# Patient Record
Sex: Male | Born: 1935 | Hispanic: Yes | Smoking: Never smoker
Health system: Southern US, Community
[De-identification: ages and names within clinical notes are randomized; demographics above are authoritative.]

## PROBLEM LIST (undated history)

## (undated) DIAGNOSIS — I4891 Unspecified atrial fibrillation: Secondary | ICD-10-CM

## (undated) DIAGNOSIS — I509 Heart failure, unspecified: Secondary | ICD-10-CM

## (undated) DIAGNOSIS — R0602 Shortness of breath: Secondary | ICD-10-CM

## (undated) DIAGNOSIS — I2510 Atherosclerotic heart disease of native coronary artery without angina pectoris: Secondary | ICD-10-CM

## (undated) DIAGNOSIS — I10 Essential (primary) hypertension: Secondary | ICD-10-CM

## (undated) DIAGNOSIS — I452 Bifascicular block: Secondary | ICD-10-CM

## (undated) DIAGNOSIS — R569 Unspecified convulsions: Secondary | ICD-10-CM

## (undated) DIAGNOSIS — I447 Left bundle-branch block, unspecified: Secondary | ICD-10-CM

## (undated) DIAGNOSIS — E7800 Pure hypercholesterolemia, unspecified: Secondary | ICD-10-CM

## (undated) DIAGNOSIS — I272 Pulmonary hypertension, unspecified: Secondary | ICD-10-CM

## (undated) DIAGNOSIS — I219 Acute myocardial infarction, unspecified: Secondary | ICD-10-CM

## (undated) DIAGNOSIS — I513 Intracardiac thrombosis, not elsewhere classified: Secondary | ICD-10-CM

## (undated) DIAGNOSIS — I639 Cerebral infarction, unspecified: Secondary | ICD-10-CM

## (undated) HISTORY — DX: Shortness of breath: R06.02

## (undated) HISTORY — PX: INGUINAL HERNIA REPAIR: SUR1180

## (undated) HISTORY — DX: Bifascicular block: I45.2

## (undated) HISTORY — DX: Left bundle-branch block, unspecified: I44.7

## (undated) HISTORY — DX: Heart failure, unspecified: I50.9

---
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
## 2002-02-04 ENCOUNTER — Encounter: Payer: Self-pay | Admitting: General Surgery

## 2002-02-05 ENCOUNTER — Encounter (INDEPENDENT_AMBULATORY_CARE_PROVIDER_SITE_OTHER): Payer: Self-pay

## 2002-02-05 ENCOUNTER — Inpatient Hospital Stay (HOSPITAL_COMMUNITY): Admission: RE | Admit: 2002-02-05 | Discharge: 2002-02-08 | Payer: Self-pay | Admitting: General Surgery

## 2002-05-27 ENCOUNTER — Inpatient Hospital Stay (HOSPITAL_COMMUNITY): Admission: EM | Admit: 2002-05-27 | Discharge: 2002-05-30 | Payer: Self-pay | Admitting: Emergency Medicine

## 2004-03-27 ENCOUNTER — Inpatient Hospital Stay (HOSPITAL_COMMUNITY): Admission: EM | Admit: 2004-03-27 | Discharge: 2004-03-29 | Payer: Self-pay | Admitting: Emergency Medicine

## 2004-03-29 ENCOUNTER — Encounter (INDEPENDENT_AMBULATORY_CARE_PROVIDER_SITE_OTHER): Payer: Self-pay | Admitting: *Deleted

## 2004-05-05 ENCOUNTER — Ambulatory Visit (HOSPITAL_COMMUNITY): Admission: RE | Admit: 2004-05-05 | Discharge: 2004-05-05 | Payer: Self-pay | Admitting: Cardiology

## 2005-03-02 ENCOUNTER — Emergency Department (HOSPITAL_COMMUNITY): Admission: EM | Admit: 2005-03-02 | Discharge: 2005-03-02 | Payer: Self-pay | Admitting: Emergency Medicine

## 2007-10-31 ENCOUNTER — Emergency Department (HOSPITAL_COMMUNITY): Admission: EM | Admit: 2007-10-31 | Discharge: 2007-10-31 | Payer: Self-pay | Admitting: Emergency Medicine

## 2008-02-15 ENCOUNTER — Emergency Department (HOSPITAL_COMMUNITY): Admission: EM | Admit: 2008-02-15 | Discharge: 2008-02-15 | Payer: Self-pay | Admitting: Emergency Medicine

## 2008-02-26 ENCOUNTER — Emergency Department (HOSPITAL_COMMUNITY): Admission: EM | Admit: 2008-02-26 | Discharge: 2008-02-26 | Payer: Self-pay | Admitting: Emergency Medicine

## 2010-07-10 ENCOUNTER — Encounter: Payer: Self-pay | Admitting: Cardiology

## 2010-11-05 NOTE — Cardiovascular Report (Signed)
NAMEDAMEON, SOLTIS NO.:  [id number]   MEDICAL RECORD NO.:  [id number]          PATIENT TYPE:  OIB   LOCATION:  2899                         FACILITY:  [facility]   PHYSICIAN:  Madaline Savage, M.D.DATE OF BIRTH:  September 11, 1932   DATE OF PROCEDURE:  05/05/2004  DATE OF DISCHARGE:                              CARDIAC CATHETERIZATION   PROCEDURES PERFORMED:  1.  Selective coronary angiography by Judkins technique.  2.  Retrograde left heart catheterization.  3.  Left ventricular angiography.  4.  Right heart catheterization with thermodilution and Fick cardiac output      determinations.   ENTRY SITE:  Right femoral.   DYE USED:  Omnipaque.   MEDICATIONS GIVEN:  None.   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  He is taken care of by the doctors at [facility] here in  [place].  I have been his cardiologist for some time.   The patient has had chronic congestive heart failure symptoms and has a  known nonischemic cardiomyopathy with ejection fractions anywhere between  15% and 25%.  He recently complained of some chest discomfort and had a  Cardiolite stress test performed on April 20, 2004; the left ventricular  ejection fraction was measured at 27% with global hypokinesis noted and  there was said to be mild anterolateral wall ischemia.  The patient was  brought in today with a Spanish-speaking interpreter for diagnostic cardiac  catheterization, which was performed uneventfully.   RESULTS:   PRESSURES:  Left ventricular pressure was 121/6, end-diastolic pressure 31,  central aortic pressure 121/50 and mean of 80.  Right atrial mean pressure  9, rapid ventricular pressure 40/15, end-diastolic pressure 25.  Pulmonary  capillary wedge mean pressure was 18.  Thermodilution cardiac output was  4.3, thermodilution cardiac index was 4.3.  Fick  cardiac output was 4.0,  Fick cardiac index was 2.2.  There was no aortic valve gradient by pullback  technique.   ANGIOGRAPHIC RESULTS:  The patient's coronary arteries were widely patent  throughout.  There was mild calcification in the proximal LAD and distal  left main, but no significant lesions noted at either place.  The LAD gave  rise to 1 major diagonal branch, an intermediate ramus branch and a  nondominant circumflex branch, all of which were normal except for some mild  calcifications.   The right coronary artery was a large dominant vessel giving rise to a  posterior descending branch and 2 posterolateral branches.  No lesions were  seen.  The left ventricular angiogram showed that there was global  hypokinesis with an ejection fraction estimate of 15%.  There was mild  mitral regurgitation.  There was no evidence of any LV thrombus.  Peripheral  arteriography was not performed.   FINAL DIAGNOSES:  1.  Nonspecific cardiomyopathy.      1.  Angiographic patent coronary arteries.      2.  Left ventricular ejection fraction qualitatively 15%.      3.  Mild mitral regurgitation.      4.  Mild-to-moderate pulmonary hypertension with pulmonary artery          pressures stable from catheterization in 2003.  2.  Chronic congestive heart failure in a 77 year old who continues to work.  3.  Left bundle branch block.   RECOMMENDATIONS:  Considering the patient's symptomatology, pulmonary  hypertension, left bundle branch block and left ventricular ejection  fraction, the patient should continue on ACE inhibitor, beta blocker,  digoxin, Lasix, aldosterone antagonist and should be considered for  placement of a biventricular device for his congestive heart failure.  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       WHG/MEDQ  D:  05/05/2004  T:  05/05/2004  Job:  [id number]   cc:   Rosanna Randy E. Severiano Gilbert, M.D.  Fax: [phone]   [facility]

## 2010-11-05 NOTE — Discharge Summary (Signed)
NAMELATRON, RIBAS NO.:  [id number]   MEDICAL RECORD NO.:  [id number]                   PATIENT TYPE:  INP   LOCATION:  2001                                 FACILITY:  [facility]   PHYSICIAN:  Darlin Priestly, M.D.             DATE OF BIRTH:  [birth year]   DATE OF ADMISSION:  05/27/2002  DATE OF DISCHARGE:  05/30/2002                                 DISCHARGE SUMMARY   DISCHARGE DIAGNOSES:  1. Chest pain.     a. Negative myocardial infarction.     b. Patent coronary arteries.  2. Abnormal electrocardiogram.  3. Nonischemic cardiomyopathy, ejection fraction 25%.  4. Hyperlipidemia.  5. Hypertension, now controlled.  6. Premature ventricular contractions.  7. Moderate pulmonary hypertension.   PROCEDURES:  May 28, 2002:  Left heart catheterization followed by right  heart catheterization by Dr. Madaline Savage.   DISCHARGE CONDITION:  Improved.   DISCHARGE MEDICATIONS:  1. Enteric-coated aspirin 325 mg daily.  2. Lipitor 20 daily.  3. Stool softener as needed.  4. Aldactone 12.5 mg 1 daily.  5. Lanoxin 0.125 mg daily.  6. Coreg 6.25 one twice a day.  7. Vasotec 10 mg twice a day.  8. Zantac as before.   ACTIVITY:  No lifting over 15-20 pounds.  No strenuous activity.  No work  until Wednesday, December 17.   DIET:  A very low-salt diet.  No added salt.   WOUND CARE:  Wash catheterization site with soap and water.  Call the office  if any bleeding, swelling, or drainage.   DISCHARGE INSTRUCTIONS:  Have blood work done at [facility] on Monday.   FOLLOW UP:  See Dr. Jenne Campus on June 04, 2002 at 10:30, phone number [phone]  [phone].   The interpreter was with [name], as the patient does not speak any [place].  All  of his instructions while written in English were reviewed in [place].  The  patient had a good understanding prior to discharge of his instructions.   HISTORY OF PRESENT ILLNESS:  A 77 year old [name] with no [place]-  speaking skills presented to the emergency room May 27, 2002 with  substernal chest pain, dyspnea on exertion for about two weeks.  The patient  states he had always had a failing heart but no heart catheterization  previously.  Five years ago, he was diagnosed with heart failure.  His chest  pain only came with exertion and relieved with rest.   PAST MEDICAL HISTORY:  Hypertension, hyperlipidemia with an LDL of 152 in  July 2003, reflux disease, and status post inguinal hernia repair.   OUTPATIENT MEDICATIONS:  1. Lasix 40.  2. Enalapril 10 daily.  3. Zantac.   ALLERGIES:  SULFA.   SOCIAL HISTORY:  No smoking, no alcohol.  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  He has to push a mop.  He  has not been able to lift  over 20 pounds with his job secondary to his heart.  He basically just  pushes a mop.  He is not married.  He lives with a girlfriend.  He has two  children and two grandchildren.  He moved here two years ago.   FAMILY HISTORY:  Mother died of an MI in her 79s.  Father died of an MI at  40.   REVIEW OF SYSTEMS:  See H&P.   PHYSICAL EXAMINATION:  VITAL SIGNS:  Physical exam at discharge:  Blood  pressure was 118/70, on the night shift unsure if it is 114/80 or 154/80.  Pulse was 75, respirations 18, and temperature was 98.  Room air oxygen  saturation was 97%.  GENERAL:  Alert, oriented male in no acute distress.  SKIN:  Warm and dry.  LUNGS:  Clear to auscultation.  HEART:  S1, S2.  Regular rate and rhythm without gallop.  ABDOMEN:  Soft, positive bowel sounds.   LABORATORY DATA:  Hemoglobin 15.3, hematocrit 45.2, WBC 12.2, MCV 90,  platelets 276, neutrophils 81, lymphs 12, monos 5, eos 1, baso 1.  These  remained stable throughout hospitalization.  Coags:  PT 14.8, INR 1.2, PTT  31.  Chemistries:  Sodium 141, potassium 4.6, chloride 108, CO2 26, glucose  93, BUN 24, creatinine 1.4, calcium 8.4.  These remained stable.  CK 54, MB  1.9,  troponin 0.01.  Other CK's were all negative.  TSH 1.20.  Urinalysis  had a trace leukocyte esterase.  Culture was still pending.   Chest x-ray:  Cardiomegaly, question right middle lobe atelectasis or  infiltrate.  EKG on admission:  Sinus rhythm, rate of 96, with PVC's and  left ventricular hypertrophy.  Followup ST-T wave abnormality considering  lateral ischemia and no significant change overnight.  Cardiac  catheterization:  Please see Dr. Truett Perna dictated note for complete  details.  On May 31, 2002, normal coronary arteries, LV, EF was 25%.  Pulmonary capillary wedge pressure was 32.  Cardiac output 4.4 and index 2.3  by thermal.  __________ was 2.5 and 1.3.  No mitral gradient.   HOSPITAL COURSE:  The patient was admitted by Dr. Jenne Campus May 27, 2002  after presenting with chest pain, shortness of breath.  He was placed on IV  heparin and nitroglycerin and admitted to the telemetry unit.  Plans were  made for cardiac catheterization, which he underwent on December 9.  Cardiac  enzymes were negative for MI.  He underwent cardiac catheterization with  results as above.   By May 29, 2002, secondary to a cardiomyopathy, non-ischemic, he was  started on Lanoxin.  His Enalapril was increased and his Coreg was added and  Lopressor discontinued.  Blood pressure medications were held on December 10  secondary to hypertension.  That had improved by December 11 and he was  given his whole dose of medications and by December 30 he had no dizziness,  no lightheadedness, and blood pressure was stable at 100.  He was discharged  home after a discussion with the interpreter.  He will be followed up as an  outpatient.     Darcella Gasman. Brooke Dare, M.D.    LRI/MEDQ  D:  05/30/2002  T:  05/30/2002  Job:  [id number]   cc:   Maurice March, M.D.  [address]  [address]  [place]  [place] [address]  Fax: [phone]

## 2010-11-05 NOTE — H&P (Signed)
NAME:  Tyler Christensen, BRING NO.:  [id number]   MEDICAL RECORD NO.:  [id number]          PATIENT TYPE:  EMS   LOCATION:  MAJO                         FACILITY:  [facility]   PHYSICIAN:  Lonia Blood, M.D.      DATE OF BIRTH:  17-Jul-1932   DATE OF ADMISSION:  03/26/2004  DATE OF DISCHARGE:                                HISTORY & PHYSICAL   PRIMARY CARE PHYSICIAN:  Unassigned. Goes to [employer].   PRESENTING COMPLAINT:  Chest pain, shortness of breath times three days.   HISTORY OF PRESENT ILLNESS:  This is a 77 year old [name] immigrant who is  non-English-speaking presenting with a three day history of exertional  dyspnea and chest pain which is mainly right sided associated with some  numbness.  The patient has been noncompliant with his medication and has had  a history of CHF for the past two days.  He last saw his primary care  physician one and a half years ago.  He recently saw another private  physician, Dr. __________ , two months ago for medication refills.  The  patient described his shortness of breath as exertional and has been  progressive over the three days.  He has also had this chest pain that is  nagging but has resolved here with some nitro in the hospital.  Otherwise he  has been doing okay with his ADLs.   PAST MEDICAL HISTORY:  1.  Nonischemic cardiomyopathy with an EF of 25% in 2003.  2.  The patient had a cath done by Dr. Elsie Lincoln that shows patent coronary      arteries at the time.  3.  History of dyslipidemia.  4.  CHF.  5.  Hypertension.  6.  Frequent PVCs.  7.  Pulmonary hypertension.   ALLERGIES:  SULFA.   MEDICATIONS:  1.  Digoxin 0.125 mg daily.  2.  Lasix 40 mg daily.  3.  Enalapril 10 mg daily.  4.  Coreg 6.25 mg daily.  5.  Spironolactone 25 mg daily.  6.  Zocor 40 mg daily.   SOCIAL HISTORY:  The patient speaks no English at all and speak through an  interpreter.  He lives here in [place] with his wife.  He immigrated  from  [place] four years ago.  He works with a company.  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  Still  able to go to work.  Denied any tobacco or alcohol intake.  The patient has  two children and two grandchildren.   FAMILY HISTORY:  His parents died back in [place].  Mother died in her 63s of  MI and his father also died of MI at the age of 82.  His children are both  healthy.   REVIEW OF SYSTEMS:  GENERAL:  Mainly tiredness and weak.  There is no weight  gain or weight loss.  RESPIRATORY:  As in HPI.  CARDIOVASCULAR:  As in HPI.  ABDOMEN:  The patient denied any nausea, vomiting, or diarrhea, or abdominal  pain.  GU:  The patient denied any dysuria or discharge.  EXTREMITIES:  The  patient denied any leg weakness or joint swelling.   PHYSICAL EXAMINATION:  VITAL SIGNS:  Temperature is 97.3, his blood pressure  is 121/75, pulse of 98, respiratory rate of 20, his sat is 99% on 2 liters.  GENERAL:  The patient is non-English-speaking but very __________  speaks  fluently through an interpreter.  HEENT:  PERRL.  EOMI.  NECK:  Supple.  Mildly elevated JVD about 5-cm.  CHEST:  Showed increased crackles at the bases but no wheezes or rales.  CARDIOVASCULAR:  The patient is tachycardic by exam with a grade 3/4  systolic ejection murmur.  ABDOMEN:  Distended, firm, nontender, with positive bowel sounds.  EXTREMITIES:  Show no evidence of edema, cyanosis, or clubbing.  SKIN:  Warm and dry.   LABS:  Showed a BNP of 1,417.  Creatinine is 1.4, sodium 138, potassium 4.5,  chloride 106, glucose is 104, BUN 22.   His EKG showed a new left bundle branch block that was not there two years  ago.   Chest x-ray shows cardiomegaly.   ASSESSMENT:  This is a 77 year old gentleman with a history of congestive  heart failure and severely decreased ejection fraction of 25% who was last  seen by a cardiologist two years ago.  He had followup scheduled  as an  outpatient but he never did that and apparently has not been very compliant,  although he brought in his medications.  The patient is here today with  exertional dyspnea and chest pain.  Most likely with a high BNP and his  presentation it seems like the patient is having a congestive heart failure  exacerbation.  The cause of the exacerbation is not clear.  The patient said  that he had severe cold symptoms two weeks ago but all that has cleared.  His current symptoms have been steadily increasing for three days now.  With  a family history of myocardial infarction, although at old age and the fact  that he has a new left bundle branch block, it is worrisome for probably new  myocardial infarction in the patient.  We will, therefore, admit the  patient, try and rule out myocardial infarction by checking his enzymes,  treat his congestive heart failure by increasing his diuresis, and keep him  on his beta-blockers, ACE inhibitor, and digoxin.  We will check a digoxin  level in the patient.  Meanwhile, we will start the patient on some  nitroglycerin as needed.  His blood pressure is borderline, I will,  therefore, avoid using any drip or paste.  I will use oral nitroglycerin  sublingual as needed.  I will also start him on some Lovenox  prophylactically.  If his enzymes show any sign of bump, I will switch him  to a therapeutic dose.  Meanwhile, I will give him some intravenous morphine  as needed for pain while we are working him out.  I will definitely call in  cardiology to see the patient tomorrow as well as check a 2D echocardiogram.  Other things I will do is check his TSH, check his urine drug screen and  fasting lipid panel for his hyperlipidemia.       LG/MEDQ  D:  03/27/2004  T:  03/27/2004  Job:  [id number]

## 2010-11-05 NOTE — Cardiovascular Report (Signed)
NAMEJAIVYN, Tyler Christensen NO.:  [id number]   MEDICAL RECORD NO.:  [id number]                   PATIENT TYPE:  INP   LOCATION:  2001                                 FACILITY:  [facility]   PHYSICIAN:  Madaline Savage, M.D.             DATE OF BIRTH:  [birth year]   DATE OF PROCEDURE:  05/28/2002  DATE OF DISCHARGE:                              CARDIAC CATHETERIZATION   PROCEDURES PERFORMED:  1. Selective coronary angiography by Judkins technique.  2. Retrograde left heart catheterization.  3. Left ventricular angiography.  4. Right heart catheterization.  5. Thermodilution cardiac output determinations.   ENTRY SITE:  Right femoral.   DYE USED:  Omnipaque.   MEDICATIONS GIVEN:  Versed 2 mg intravenously.   PATIENT PROFILE:  The patient is a 77 year old [place] gentleman who entered  the emergency room complaining of chest pain and shortness of breath on  May 27, 2002.  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  His  mother diet at age 75 of a heart attack, father died at age 75 of a heart  attack.  The patient has a history of a hernia repair by Dr. Kendrick Ranch  several months ago.   RESULTS:  PRESSURES:  The right atrial mean pressure was 17.  Right  ventricular pressure was 60/5, end-diastolic pressure 12.  Pulmonary artery  pressure 50/33, mean of 40.  Pulmonary capillary wedge mean pressure was 32,  left ventricular pressure was 120/14.  End-diastolic pressure 23.  Central  aortic pressure 120/55, mean of 80.  Thermodilution cardiac outputs 4.4,  index 2.3.  Fick cardiac outputs 2.5, index 1.3.  Left ventricular ejection  fraction both qualitative and quantitative were 25%.   ANGIOGRAPHIC RESULTS:  The left main coronary artery was large and normal in  appearance.   The left anterior descending coronary artery gave rise to a very large  diagonal branch very near  its origin from the left main coronary artery.  This vessel was normal.  A second smaller bifurcating branch arose distal to  three septal perforator branches and was normal.  The LAD itself contains a  speck of calcification proximally but no significant lesions.   The left circumflex coronary artery gave rise to one obtuse marginal branch  and was normal throughout.  There was a separate atrial branch coming off of  the left main coronary artery separate from the circumflex which also  contained no lesions.   The right coronary artery was angiographically patent throughout and was a  dominant vessel giving rise to two acute marginal branches, a pulmonary  conus and a sinus node branch and terminating as a posterior descending and  a posterolateral branch, all of which appeared normal.   The left ventricle was dilated.  It was globally hypokinetic.  The best  motion is at the upper anterolateral wall.  There appears to be dyskinetic  motion at the apex.  There is no obvious LV thrombosis noted.  There is  trivial mitral regurgitation seen.  The ascending aorta appears normal.   FINAL DIAGNOSES:  1. Angiographic patent coronary arteries.  2. Congestive dilated cardiomyopathy, nonischemic type.  3. Moderate pulmonary hypertension.  4. Low cardiac index by Fick.   RECOMMENDATIONS:  The patient should be started on digitalis.  We will  continue his ACE inhibitor beta blocker and aspirin.  He will get a two-  dimensional echocardiogram either while here in the hospital as an  outpatient to confirm my suspicion of only mild mitral regurgitation.  The  patient will need followup and will need the assistance of a Spanish  interpreter in his future travels.                                                  Madaline Savage, M.D.    WHG/MEDQ  D:  05/28/2002  T:  05/28/2002  Job:  [id number]   cc:   Darlin Priestly, M.D.  [address], [address]  [place]  [place] [address]  Fax:  [phone]   Room #2001   Cardiac Catheterization Laboratory

## 2010-11-05 NOTE — Discharge Summary (Signed)
   Tyler Christensen, Tyler Christensen                             ACCOUNT NO.:  [id number]   MEDICAL RECORD NO.:  [id number]                   PATIENT TYPE:  INP   LOCATION:  0376                                 FACILITY:  [facility]   PHYSICIAN:  Timothy E. Earlene Plater, M.D.              DATE OF BIRTH:  10/20/1932   DATE OF ADMISSION:  12/06/2001  DATE OF DISCHARGE:  12/09/2001                                 DISCHARGE SUMMARY   FINAL DIAGNOSES:  Incarcerated direct left inguinal hernia, right direct and  indirect inguinal hernia.   OPERATIVE PROCEDURE:  February 05, 2002:  Repair of bilateral inguinal  hernias.   HISTORY OF PRESENT ILLNESS:  This is a [name] American who does not speak  [place].  He has an enormous left inguinal hernia incarcerated.  Surgical  procedure was done on August 19 after careful interpreted evaluation in the  office.  Because of the size of the hernia and the amount of bowel  incarcerated, I elected to keep the patient a couple of days in the hospital  to control pain, to watch the wound, and to avoid GI problems.   His admitting laboratory data was normal except for cardiogram which was  abnormal, nonspecific conduction delay, nonspecific T-wave changes.  Chest x-  ray was negative.   The patient had an uneventful postoperative recovery.  GI function resumed.  No wound problems.  He was discharged on August 22 with pain pills only.  He  will be followed closely as an outpatient.                                               Timothy E. Earlene Plater, M.D.    TED/MEDQ  D:  02/14/2002  T:  02/14/2002  Job:  [id number]

## 2010-11-05 NOTE — Discharge Summary (Signed)
NAME:  Tyler Christensen, Tyler Christensen NO.:  [id number]   MEDICAL RECORD NO.:  [id number]          PATIENT TYPE:  INP   LOCATION:  3709                         FACILITY:  [facility]   PHYSICIAN:  Mallory Shirk, MD     DATE OF BIRTH:  03-Jun-1933   DATE OF ADMISSION:  03/26/2004  DATE OF DISCHARGE:  03/29/2004                                 DISCHARGE SUMMARY   HISTORY OF PRESENT ILLNESS:  Mr. Cieslinski is a 77 year old [name] immigrant who  is non-English speaking, who presented with a three day history of  exertional dyspnea and chest pain, mainly on the right side, associated with  some numbness.  The patient has been noncompliant with his medications, and  has had a history of congestive heart failure in the past with an ejection  fraction of 25% in 2003.  A cardiac catheterization done by Dr. Elsie Lincoln in  2003 indicated patent coronary arteries.  The patient describes shortness of  breath as something he noticed after he was working out in the yard and  progressively worsened over the three days prior to admission.  The patient  also stated the chest pain has been on the right side, has been nagging, but  he came into the hospital and then the pain dissolved upon administration of  nitroglycerin in the ER.  The patient is otherwise okay with no other  complaints, independent and lives by himself.  He does his own ADL's.   The patient had a similar episode of chest pain in 2003 and had a cardiac  workup by Dr. Elsie Lincoln.   PAST MEDICAL HISTORY:  1.  Nonischemic cardiomyopathy, ejection fraction 25% in 2003.  2.  Dyslipidemia.  3.  Congestive heart failure.  4.  Hypertension.  5.  Pulmonary hypertension.   MEDICATIONS ON ADMISSION:  1.  Digoxin 0.125 p.o. daily.  2.  Lasix 40 mg p.o. daily.  3.  Enalapril 10 mg p.o. daily.  4.  Coreg 6.25 mg p.o. daily.  5.  Spironolactone 25 mg p.o. daily.  6.  Zocor 40 mg p.o. daily.   PHYSICAL EXAMINATION:  VITAL SIGNS:  Temperature 97.3, blood  pressure  121/75, pulse 98, respirations 20, saturation 99% on 2 liters nasal canula.  GENERAL APPEARANCE:  Nonspeaking, very pleasant, Hispanic gentleman, looking  younger than stated age.  History obtained through an interpreter.  In no  acute distress.  HEENT:  NCAT.  PERRL.  NECK:  Supple.  JVD about 5 cm.  LUNGS:  Bibasilar crackles.  No wheezes or rales.  CVS:  Tachycardic with 3/6 systolic ejection murmur.  ABDOMEN:  Distended, firm, nontender, positive bowel sounds.  No tenderness.  EXTREMITIES:  No cyanosis, clubbing or edema.   LABORATORY DATA:  On admission, BNP 1417.  Sodium 138, potassium 4.5,  chloride 106, BUN 22, creatinine 1.4, glucose 104.  EKG:  Left bundle branch  block seen in EKG of December 2003.  Chest x-ray:  Mild cardiomegaly.  CT of  the chest:  No evidence of dissection.   HOSPITAL COURSE:  #1 - CHEST PAIN.  The  patient was admitted to monitored  bed.  He was ruled out for MI by enzymes.  By hospital day #2, the patient's  pain had resolved.  He was comfortable throughout his hospital stay which  was uneventful.   Dr.  Elsie Lincoln was notified about the patient being in the hospital.  He will  see the patient as an outpatient.  Follow up appointment has been made for  this.  A transthoracic echocardiogram was done to evaluate his present  ejection fraction which is pending at the time of discharge.  It will be  available to Dr. Elsie Lincoln on his outpatient visit.   #2 - HYPERTENSION.  The patient's blood pressure was very controlled in the  hospital by his home medications.   DISCHARGE MEDICATIONS:  1.  Digoxin 0.125 mg p.o. daily.  2.  Lasix 40 mg p.o. daily.  3.  Enalapril 10 mg p.o. daily.  4.  Coreg 6.25 mg p.o. daily.  5.  Spironolactone 25 mg p.o. daily.  6.  Zocor 40 mg p.o. daily.  7.  Aspirin 81 mg p.o. daily.   FOLLOWUP:  1.  Dr. Elsie Lincoln, Cardiology, Friday, April 02, 2004, at 10:00 a.m.  2.  [facility] in two weeks.  The patient has been  provided information to      make the follow up appointment.   The patient indicated that he would need some assistance with his  medications.  Ms. __________ Nurse Case Manager, has arranged for the  paperwork and for Mr.  Peters to get his medications.  He will be provided  with the assistance that is available.   Mr. Colclough was advised through an interpreter to restrict his activity to an  as tolerated basis.  He was also given a booklet of information on CHF.  Through the interpreter, Mr. Pepitone indicated that he understood the contents  of the booklet.  He understood the importance of compliance with medications  and plans to do so.   The patient was also advised to return to the emergency department  immediately upon onset of chest pain, shortness of breath or any of the  symptoms that may need immediate medical attention.      GDK/MEDQ  D:  03/29/2004  T:  03/29/2004  Job:  [id number]   cc:   Madaline Savage, M.D.  [address], [address]  [place]  [place] [address]  Fax: [phone]

## 2010-11-05 NOTE — Op Note (Signed)
Tyler Christensen, Tyler Christensen                            ACCOUNT NO.:  [id number]   MEDICAL RECORD NO.:  [id number]                   PATIENT TYPE:  INP   LOCATION:  X003                                 FACILITY:  [facility]   PHYSICIAN:  Timothy E. Earlene Plater, M.D.              DATE OF BIRTH:  1932/09/02   DATE OF PROCEDURE:  02/05/2002  DATE OF DISCHARGE:                                 OPERATIVE REPORT   PREOPERATIVE DIAGNOSIS:  Bilateral inguinal hernias.   POSTOPERATIVE DIAGNOSES:  1. Left incarcerated direct inguinal hernia.  2. Right direct and indirect inguinal hernia.   OPERATIVE PROCEDURE:  Repair of bilateral inguinal hernias with mesh.   SURGEON:  Timothy E. Earlene Plater, M.D.   ANESTHESIA:  General.   INDICATIONS FOR PROCEDURE:  The patient is 55, a Cuban-American, recently  moved to [facility], has had an enormous left inguinal hernia for 10+ years  and has recently developed increasing pain in the right groin as well.  When  seen in the office, he had an incarcerated left inguinal hernia well down  into the scrotum and a moderate right inguinal hernia.  He was carefully  informed and fully ready to proceed.  He does appear to be otherwise healthy  except for hypertension which is controlled with Lasix only.  He has been  seen and evaluated in [facility].  He is [place]-American.  He speaks  Spanish, and two different interpreters had completely explained this to  him, and he is delightful, cheerful, and gives his consent, and is ready to  proceed.  He has been warned about the surgery, the potential problems and  complications, particularly including the left side, including bleeding,  infection, and loss of a testicle.   DESCRIPTION OF PROCEDURE:  He was seen and evaluated by anesthesia,  identified, and permit signed.   He was taken to the operating room, placed supine, general endotracheal  anesthesia administered.  The patient was placed in the head-down position.  The entire  groin, scrotum, genitalia, and lower abdomen were prepped, and  draped in the usual fashion.  I tried to reduce the left inguinal hernia.  It would not reduce.   I approached the left side first, made a generous incision, and dissected  through the subcutaneous tissue, identified the external oblique, dissected  it superiorly and then inferiorly.  I isolated the hernia mass and then with  blunt dissection, delivered it from the scrotum along with the testicle  which appeared small but normal.  The cord structures were splayed out over  the hernia sac, so they were carefully bluntly dissected off the sac, and  the sac was isolated and in order to reduce it, I had to open the internal  ring by dividing the internal oblique superiorly.  I was then able to reduce  that hernia mass of bowel back into the abdomen.  The cord structures were  further  dissected from the sac and kept lateral.  A large piece of mesh was  cut and fashioned to fit the floor of the canal from the pubic tubercle up  to and around the newly-created internal ring.  The internal oblique muscle  was repaired, and the mesh was sewn down to the surrounding fascia with  interrupted figure-of-eight sutures of 0 Prolene.  Completed that repair.  The testicle seemed viable and was replaced by the gubernaculum down into  the scrotum.  I closed the external oblique with running 2-0 Prolene.  Counts were correct at this point.  The patient was stable.  The wound was  left covered, and then I approached the left side, of course, through a  separate incision.  The external oblique was identified, dissected free.  It  was opened in line with the fibers through the external ring which was  enlarged.  The cord structures appeared normal.  They were carefully  dissected back to the internal ring where a large lipomatous structure was  identified along with an indirect sac.  The sac was dissected from the cord  structures and from the  lipomata structures.  It was separately ligated at  its neck with a 0 Prolene suture, the excess sac cut away, submitted to  specimen.  Then the entire lipomatous structure was reduced through the  internal ring, and a plug of mesh was placed there and sutured in placed  with a 0 Prolene.  A separate piece patch mesh was placed over the floor of  the canal and sewn to the surrounding fascia with running 2-0 Prolene  suture.  This completed the repair.  The cord seemed intact.  It was  replaced in its anatomic position.  The external oblique was closed with 2-0  Prolene.  With the counts correct and the patient stable, both wounds were  closed with side skin staples.  Again, he tolerated it well.  He was stable.  Counts were correct; a dry sterile dressing was applied, and he was removed  to the recovery room.   He will be kept at least overnight for wound care and observation for  complications and followed as an outpatient.                                               Timothy E. Earlene Plater, M.D.    TED/MEDQ  D:  02/05/2002  T:  02/05/2002  Job:  [id number]   cc:   [employer]

## 2010-11-30 ENCOUNTER — Emergency Department (HOSPITAL_COMMUNITY)
Admission: EM | Admit: 2010-11-30 | Discharge: 2010-11-30 | Disposition: A | Discharge status: Home / Self Care | Payer: Medicare Other | Attending: Emergency Medicine | Admitting: Emergency Medicine

## 2010-11-30 ENCOUNTER — Emergency Department (HOSPITAL_COMMUNITY): Payer: Medicare Other

## 2010-11-30 DIAGNOSIS — I2510 Atherosclerotic heart disease of native coronary artery without angina pectoris: Secondary | ICD-10-CM | POA: Insufficient documentation

## 2010-11-30 DIAGNOSIS — I509 Heart failure, unspecified: Secondary | ICD-10-CM | POA: Insufficient documentation

## 2010-11-30 DIAGNOSIS — Z79899 Other long term (current) drug therapy: Secondary | ICD-10-CM | POA: Insufficient documentation

## 2010-11-30 DIAGNOSIS — I10 Essential (primary) hypertension: Secondary | ICD-10-CM | POA: Insufficient documentation

## 2010-11-30 DIAGNOSIS — R0602 Shortness of breath: Secondary | ICD-10-CM | POA: Insufficient documentation

## 2010-11-30 DIAGNOSIS — R0989 Other specified symptoms and signs involving the circulatory and respiratory systems: Secondary | ICD-10-CM | POA: Insufficient documentation

## 2010-11-30 DIAGNOSIS — I4891 Unspecified atrial fibrillation: Secondary | ICD-10-CM | POA: Insufficient documentation

## 2010-11-30 DIAGNOSIS — R0609 Other forms of dyspnea: Secondary | ICD-10-CM | POA: Insufficient documentation

## 2010-11-30 LAB — URINALYSIS, ROUTINE W REFLEX MICROSCOPIC
Glucose, UA: NEGATIVE mg/dL
Hgb urine dipstick: NEGATIVE
Ketones, ur: NEGATIVE mg/dL
Protein, ur: NEGATIVE mg/dL

## 2010-11-30 LAB — DIFFERENTIAL
Eosinophils Relative: 2 % (ref 0–5)
Lymphocytes Relative: 13 % (ref 12–46)
Lymphs Abs: 1.5 10*3/uL (ref 0.7–4.0)
Monocytes Absolute: 0.9 10*3/uL (ref 0.1–1.0)

## 2010-11-30 LAB — BASIC METABOLIC PANEL
BUN: 24 mg/dL — ABNORMAL HIGH (ref 6–23)
Calcium: 9.4 mg/dL (ref 8.4–10.5)
Creatinine, Ser: 0.93 mg/dL (ref 0.4–1.5)
GFR calc Af Amer: >60 mL/min (ref >60)
GFR calc non Af Amer: >60 mL/min (ref >60)

## 2010-11-30 LAB — CBC
HCT: 44.4 % (ref 39.0–52.0)
MCH: 30.6 pg (ref 26.0–34.0)
MCHC: 33.1 g/dL (ref 30.0–36.0)
MCV: 92.3 fL (ref 78.0–100.0)
RDW: 14.2 % (ref 11.5–15.5)

## 2011-03-16 LAB — BASIC METABOLIC PANEL
BUN: 25 — ABNORMAL HIGH
CO2: 28
Calcium: 8.9
Chloride: 106
Creatinine, Ser: 1.09
GFR calc Af Amer: >60
GFR calc non Af Amer: >60
Glucose, Bld: 114 — ABNORMAL HIGH
Potassium: 4.6
Sodium: 141

## 2011-03-16 LAB — DIFFERENTIAL
Basophils Absolute: 0
Basophils Relative: 0
Eosinophils Absolute: 0.2
Eosinophils Relative: 2
Lymphocytes Relative: 8 — ABNORMAL LOW
Lymphs Abs: 1.1
Monocytes Absolute: 0.6
Monocytes Relative: 4
Neutro Abs: 12 — ABNORMAL HIGH
Neutrophils Relative %: 86 — ABNORMAL HIGH

## 2011-03-16 LAB — URINALYSIS, ROUTINE W REFLEX MICROSCOPIC
Bilirubin Urine: NEGATIVE
Glucose, UA: NEGATIVE
Hgb urine dipstick: NEGATIVE
Ketones, ur: NEGATIVE
Nitrite: NEGATIVE
Protein, ur: NEGATIVE
Specific Gravity, Urine: 1.027
Urobilinogen, UA: 1
pH: 5.5

## 2011-03-16 LAB — CBC
HCT: 44.9
Hemoglobin: 15.2
MCHC: 33.8
MCV: 93.5
Platelets: 251
RBC: 4.81
RDW: 13.8
WBC: 14 — ABNORMAL HIGH

## 2011-03-16 LAB — B-NATRIURETIC PEPTIDE (CONVERTED LAB): Pro B Natriuretic peptide (BNP): 616 — ABNORMAL HIGH

## 2012-04-04 ENCOUNTER — Emergency Department (HOSPITAL_COMMUNITY)
Admission: EM | Admit: 2012-04-04 | Discharge: 2012-04-04 | Disposition: A | Discharge status: Home / Self Care | Payer: Medicare Other | Attending: Emergency Medicine | Admitting: Emergency Medicine

## 2012-04-04 ENCOUNTER — Encounter (HOSPITAL_COMMUNITY): Payer: Self-pay

## 2012-04-04 ENCOUNTER — Emergency Department (HOSPITAL_COMMUNITY): Payer: Medicare Other

## 2012-04-04 DIAGNOSIS — Z79899 Other long term (current) drug therapy: Secondary | ICD-10-CM | POA: Insufficient documentation

## 2012-04-04 DIAGNOSIS — I10 Essential (primary) hypertension: Secondary | ICD-10-CM | POA: Insufficient documentation

## 2012-04-04 DIAGNOSIS — I509 Heart failure, unspecified: Secondary | ICD-10-CM | POA: Insufficient documentation

## 2012-04-04 HISTORY — DX: Essential (primary) hypertension: I10

## 2012-04-04 HISTORY — DX: Pure hypercholesterolemia, unspecified: E78.00

## 2012-04-04 LAB — POCT I-STAT TROPONIN I: Troponin i, poc: 0 ng/mL (ref 0.00–0.08)

## 2012-04-04 LAB — CBC WITH DIFFERENTIAL/PLATELET
Eosinophils Relative: 1 % (ref 0–5)
HCT: 45 % (ref 39.0–52.0)
Hemoglobin: 15.5 g/dL (ref 13.0–17.0)
Lymphocytes Relative: 8 % — ABNORMAL LOW (ref 12–46)
Lymphs Abs: 1 10*3/uL (ref 0.7–4.0)
MCV: 95.3 fL (ref 78.0–100.0)
Monocytes Absolute: 1 10*3/uL (ref 0.1–1.0)
RBC: 4.72 MIL/uL (ref 4.22–5.81)
WBC: 12.9 10*3/uL — ABNORMAL HIGH (ref 4.0–10.5)

## 2012-04-04 LAB — COMPREHENSIVE METABOLIC PANEL
ALT: 30 U/L (ref 0–53)
CO2: 26 mEq/L (ref 19–32)
Calcium: 9.5 mg/dL (ref 8.4–10.5)
Chloride: 100 mEq/L (ref 96–112)
Creatinine, Ser: 1.34 mg/dL (ref 0.50–1.35)
GFR calc Af Amer: 57 mL/min — ABNORMAL LOW (ref >90)
GFR calc non Af Amer: 49 mL/min — ABNORMAL LOW (ref >90)
Glucose, Bld: 113 mg/dL — ABNORMAL HIGH (ref 70–99)
Total Bilirubin: 0.8 mg/dL (ref 0.3–1.2)

## 2012-04-04 LAB — PRO B NATRIURETIC PEPTIDE: Pro B Natriuretic peptide (BNP): 9002 pg/mL — ABNORMAL HIGH (ref 0–450)

## 2012-04-04 MED ORDER — FUROSEMIDE 10 MG/ML IJ SOLN
40.0000 mg | Freq: Once | INTRAMUSCULAR | Status: AC
  Administered 2012-04-04: 40 mg via INTRAVENOUS
  Filled 2012-04-04: qty 4

## 2012-04-04 NOTE — ED Notes (Signed)
Interpretor phone used for d/c inst.

## 2012-04-04 NOTE — ED Notes (Signed)
NAD noted at time of d/c home 

## 2012-04-04 NOTE — ED Notes (Signed)
Pt here for sob, sent by pmd for sob to rule out chf and PE.

## 2012-04-04 NOTE — ED Notes (Addendum)
Interpretor on speaker phone with the patient EDP in room at this time. Pt reports he has felt SOB x2 days, no SOB at this time, Denies CP. Came to ED from urgent care for further work up after being told he may have a blood clot in his lung.

## 2012-04-04 NOTE — ED Provider Notes (Signed)
History     CSN: [id number]  Arrival date & time 04/04/12  1232   First MD Initiated Contact with Patient 04/04/12 1334      Chief Complaint  Patient presents with  . Shortness of Breath    (Consider location/radiation/quality/duration/timing/severity/associated sxs/prior treatment) HPI Hx obtained through patient and translator.  Pt states he was referred to come to the ED from an urgent care.  He had described to them that he had been feeling sob more than his baseline over the past 2 weeks.  No chest pain.  On arrival to the ED he states he is "breathing fine" and has no pain in his chest.  The [facility] wasnted him to be ruled out for PE.  He has no leg swellling.  No fever/chills.  Normal activity level.  There are no other associated systemic symptoms, there are no other alleviating or modifying factors.   Past Medical History  Diagnosis Date  . HTN (hypertension)   . High cholesterol     No past surgical history on file.  No family history on file.  History  Substance Use Topics  . Smoking status: Never Smoker   . Smokeless tobacco: Not on file  . Alcohol Use: No      Review of Systems ROS reviewed and all otherwise negative except for mentioned in HPI  Allergies  Review of patient's allergies indicates no known allergies.  Home Medications   Current Outpatient Rx  Name Route Sig Dispense Refill  . CARVEDILOL 6.25 MG PO TABS Oral Take 6.25 mg by mouth 2 (two) times daily with a meal.    . DIGOXIN 0.125 MG PO TABS Oral Take 0.125 mg by mouth daily.    . ENALAPRIL MALEATE 10 MG PO TABS Oral Take 10 mg by mouth daily.    . FUROSEMIDE 40 MG PO TABS Oral Take 40 mg by mouth 2 (two) times daily.    Marland Kitchen SIMVASTATIN 40 MG PO TABS Oral Take 40 mg by mouth daily.    Marland Kitchen SPIRONOLACTONE 25 MG PO TABS Oral Take 25 mg by mouth daily.      BP 123/65  Pulse 68  Temp 97.3 F (36.3 C) (Oral)  Resp 28  SpO2 98% Vitals reviewed Physical Exam Physical Examination: General  appearance - alert, well appearing, and in no distress Mental status - alert, oriented to person, place, and time Eyes - no conjunctival injection, no scleral icterus Mouth - mucous membranes moist, pharynx normal without lesions Chest - clear to auscultation, no wheezes, rales or rhonchi, symmetric air entry Heart - normal rate, regular rhythm, normal S1, S2, no murmurs, rubs, clicks or gallops Extremities - peripheral pulses normal, no pedal edema, no clubbing or cyanosis Skin - normal coloration and turgor, no rashes Psych- calm and cooperative  ED Course  Procedures (including critical care time)   Date: 04/04/2012  Rate: 76  Rhythm: SR with first degree AV block  QRS Axis: right super axis  Intervals: PR prolonged  ST/T Wave abnormalities: nonspecific ST/T changes  Conduction Disutrbances:nonspecific intraventricular conduction delay  Narrative Interpretation:   Old EKG Reviewed: No significant changes noted when compared with prior ECG.    Labs Reviewed  CBC WITH DIFFERENTIAL - Abnormal; Notable for the following:    WBC 12.9 (*)     Neutrophils Relative 83 (*)     Neutro Abs 10.8 (*)     Lymphocytes Relative 8 (*)     All other components within normal limits  COMPREHENSIVE  METABOLIC PANEL - Abnormal; Notable for the following:    Glucose, Bld 113 (*)     BUN 29 (*)     Alkaline Phosphatase 127 (*)     GFR calc non Af Amer 49 (*)     GFR calc Af Amer 57 (*)     All other components within normal limits  PRO B NATRIURETIC PEPTIDE - Abnormal; Notable for the following:    Pro B Natriuretic peptide (BNP) 9002.0 (*)     All other components within normal limits  D-DIMER, QUANTITATIVE  POCT I-STAT TROPONIN I  LAB REPORT - SCANNED   Dg Chest 2 View  04/04/2012  *RADIOLOGY REPORT*  Clinical Data: Shortness of breath  CHEST - 2 VIEW  Comparison: 11/30/2010  Findings: Cardiomegaly again noted.  Central mild vascular congestion mild perihilar interstitial prominence  suspicious for mild interstitial edema.  No focal infiltrate.  Stable osteopenia and mild degenerative changes thoracic spine.  IMPRESSION: Cardiomegaly again noted.  Central mild vascular congestion and mild perihilar interstitial prominence suspicious for mild interstitial edema.  No focal infiltrate.   Original Report Authenticated By: Natasha Mead, M.D.      1. CHF (congestive heart failure)       MDM  Pt presenting from urgent care center due to c/o sob x 2 weeks.  He denies any sob or chest pain here in the ED. He has hx of CHF per patient/translator and chart review.  Sees [facility] for this.  CHF is nonischemic.  His BNP is elevated but he is asymptomatic and in no distress in the ED.  He is wanting to be discharged.  D-dimer negative- I do not suspect PE at this time.  Pt given Lasix IV and due to no sob complaints in ED pt discharged, encouraged to arrange for f/u with Dr. Herbert Spires.  Discharged with strict return precautions.  Pt agreeable with plan.        Ethelda Chick, MD 04/05/12 [phone]

## 2012-04-04 NOTE — ED Notes (Signed)
Pt. returned from XR. 

## 2012-04-06 ENCOUNTER — Ambulatory Visit: Payer: Medicare Other | Admitting: Cardiology

## 2012-05-09 ENCOUNTER — Ambulatory Visit: Payer: Medicare Other | Admitting: Cardiology

## 2012-05-25 ENCOUNTER — Encounter: Payer: Self-pay | Admitting: Cardiology

## 2012-06-11 ENCOUNTER — Inpatient Hospital Stay (HOSPITAL_COMMUNITY): Payer: Medicare Other

## 2012-06-11 ENCOUNTER — Emergency Department (HOSPITAL_COMMUNITY): Payer: Medicare Other

## 2012-06-11 ENCOUNTER — Inpatient Hospital Stay (HOSPITAL_COMMUNITY)
Admission: EM | Admit: 2012-06-11 | Discharge: 2012-06-12 | DRG: 065 | Disposition: A | Discharge status: Home / Self Care | Payer: Medicare Other | Attending: Internal Medicine | Admitting: Internal Medicine

## 2012-06-11 ENCOUNTER — Encounter (HOSPITAL_COMMUNITY): Payer: Self-pay | Admitting: Emergency Medicine

## 2012-06-11 DIAGNOSIS — I452 Bifascicular block: Secondary | ICD-10-CM | POA: Diagnosis present

## 2012-06-11 DIAGNOSIS — I509 Heart failure, unspecified: Secondary | ICD-10-CM

## 2012-06-11 DIAGNOSIS — I6340 Cerebral infarction due to embolism of unspecified cerebral artery: Principal | ICD-10-CM | POA: Diagnosis present

## 2012-06-11 DIAGNOSIS — Z79899 Other long term (current) drug therapy: Secondary | ICD-10-CM

## 2012-06-11 DIAGNOSIS — R112 Nausea with vomiting, unspecified: Secondary | ICD-10-CM

## 2012-06-11 DIAGNOSIS — E7800 Pure hypercholesterolemia, unspecified: Secondary | ICD-10-CM | POA: Diagnosis present

## 2012-06-11 DIAGNOSIS — I10 Essential (primary) hypertension: Secondary | ICD-10-CM

## 2012-06-11 DIAGNOSIS — I5022 Chronic systolic (congestive) heart failure: Secondary | ICD-10-CM | POA: Diagnosis present

## 2012-06-11 DIAGNOSIS — I639 Cerebral infarction, unspecified: Secondary | ICD-10-CM

## 2012-06-11 DIAGNOSIS — I4891 Unspecified atrial fibrillation: Secondary | ICD-10-CM | POA: Diagnosis not present

## 2012-06-11 DIAGNOSIS — E860 Dehydration: Secondary | ICD-10-CM | POA: Diagnosis present

## 2012-06-11 DIAGNOSIS — Z23 Encounter for immunization: Secondary | ICD-10-CM

## 2012-06-11 DIAGNOSIS — R569 Unspecified convulsions: Secondary | ICD-10-CM

## 2012-06-11 HISTORY — DX: Unspecified convulsions: R56.9

## 2012-06-11 LAB — CBC WITH DIFFERENTIAL/PLATELET
Basophils Absolute: 0.1 10*3/uL (ref 0.0–0.1)
Basophils Relative: 1 % (ref 0–1)
Eosinophils Absolute: 0.4 10*3/uL (ref 0.0–0.7)
Eosinophils Relative: 3 % (ref 0–5)
HCT: 42.1 % (ref 39.0–52.0)
Hemoglobin: 14.4 g/dL (ref 13.0–17.0)
Lymphocytes Relative: 12 % (ref 12–46)
Lymphs Abs: 1.5 10*3/uL (ref 0.7–4.0)
MCH: 32 pg (ref 26.0–34.0)
MCHC: 34.2 g/dL (ref 30.0–36.0)
MCV: 93.6 fL (ref 78.0–100.0)
Monocytes Absolute: 0.9 K/uL (ref 0.1–1.0)
Monocytes Relative: 7 % (ref 3–12)
Neutro Abs: 10.3 K/uL — ABNORMAL HIGH (ref 1.7–7.7)
Neutrophils Relative %: 79 % — ABNORMAL HIGH (ref 43–77)
Platelets: 247 10*3/uL (ref 150–400)
RBC: 4.5 MIL/uL (ref 4.22–5.81)
RDW: 13.5 % (ref 11.5–15.5)
WBC: 13.1 10*3/uL — ABNORMAL HIGH (ref 4.0–10.5)

## 2012-06-11 LAB — MAGNESIUM: Magnesium: 1.8 mg/dL (ref 1.5–2.5)

## 2012-06-11 LAB — URINALYSIS, ROUTINE W REFLEX MICROSCOPIC
Bilirubin Urine: NEGATIVE
Glucose, UA: NEGATIVE mg/dL
Hgb urine dipstick: NEGATIVE
Ketones, ur: NEGATIVE mg/dL
Leukocytes, UA: NEGATIVE
Nitrite: NEGATIVE
Protein, ur: NEGATIVE mg/dL
Specific Gravity, Urine: 1.01 (ref 1.005–1.030)
Urobilinogen, UA: 0.2 mg/dL (ref 0.0–1.0)
pH: 5.5 (ref 5.0–8.0)

## 2012-06-11 LAB — TROPONIN I
Troponin I: <0.3 ng/mL (ref <0.30)
Troponin I: <0.3 ng/mL (ref <0.30)
Troponin I: <0.3 ng/mL (ref <0.30)
Troponin I: <0.3 ng/mL (ref <0.30)

## 2012-06-11 LAB — COMPREHENSIVE METABOLIC PANEL
ALT: 30 U/L (ref 0–53)
AST: 32 U/L (ref 0–37)
Alkaline Phosphatase: 139 U/L — ABNORMAL HIGH (ref 39–117)
CO2: 24 mEq/L (ref 19–32)
Calcium: 8.8 mg/dL (ref 8.4–10.5)
Potassium: 3.7 mEq/L (ref 3.5–5.1)
Sodium: 135 mEq/L (ref 135–145)
Total Protein: 6.4 g/dL (ref 6.0–8.3)

## 2012-06-11 LAB — COMPREHENSIVE METABOLIC PANEL WITH GFR
Albumin: 3.3 g/dL — ABNORMAL LOW (ref 3.5–5.2)
BUN: 31 mg/dL — ABNORMAL HIGH (ref 6–23)
Chloride: 98 meq/L (ref 96–112)
Creatinine, Ser: 1.12 mg/dL (ref 0.50–1.35)
GFR calc Af Amer: 71 mL/min — ABNORMAL LOW (ref >90)
GFR calc non Af Amer: 61 mL/min — ABNORMAL LOW (ref >90)
Glucose, Bld: 183 mg/dL — ABNORMAL HIGH (ref 70–99)
Total Bilirubin: 0.5 mg/dL (ref 0.3–1.2)

## 2012-06-11 LAB — DIGOXIN LEVEL: Digoxin Level: 0.7 ng/mL — ABNORMAL LOW (ref 0.8–2.0)

## 2012-06-11 LAB — GLUCOSE, CAPILLARY: Glucose-Capillary: 186 mg/dL — ABNORMAL HIGH (ref 70–99)

## 2012-06-11 LAB — PRO B NATRIURETIC PEPTIDE: Pro B Natriuretic peptide (BNP): 15163 pg/mL — ABNORMAL HIGH (ref 0–450)

## 2012-06-11 LAB — ETHANOL: Alcohol, Ethyl (B): <11 mg/dL (ref 0–11)

## 2012-06-11 MED ORDER — INFLUENZA VIRUS VACC SPLIT PF IM SUSP
0.5000 mL | INTRAMUSCULAR | Status: AC
  Administered 2012-06-12: 0.5 mL via INTRAMUSCULAR
  Filled 2012-06-11: qty 0.5

## 2012-06-11 MED ORDER — ONDANSETRON HCL 4 MG/2ML IJ SOLN
4.0000 mg | Freq: Once | INTRAMUSCULAR | Status: AC
  Administered 2012-06-11: 4 mg via INTRAVENOUS

## 2012-06-11 MED ORDER — ONDANSETRON HCL 4 MG PO TABS: 4.0000 mg | ORAL_TABLET | Freq: Four times a day (QID) | ORAL | Status: DC | PRN

## 2012-06-11 MED ORDER — ONDANSETRON HCL 4 MG/2ML IJ SOLN
4.0000 mg | Freq: Four times a day (QID) | INTRAMUSCULAR | Status: DC | PRN
  Administered 2012-06-11: 4 mg via INTRAVENOUS
  Filled 2012-06-11: qty 2

## 2012-06-11 MED ORDER — SPIRONOLACTONE 25 MG PO TABS
25.0000 mg | ORAL_TABLET | Freq: Every day | ORAL | Status: DC
Start: 2012-06-11 — End: 2012-06-12
  Administered 2012-06-11 – 2012-06-12 (×2): 25 mg via ORAL
  Filled 2012-06-11 (×4): qty 1

## 2012-06-11 MED ORDER — CARVEDILOL 6.25 MG PO TABS
6.2500 mg | ORAL_TABLET | Freq: Two times a day (BID) | ORAL | Status: DC
  Administered 2012-06-11 – 2012-06-12 (×2): 6.25 mg via ORAL
  Filled 2012-06-11 (×5): qty 1

## 2012-06-11 MED ORDER — ONDANSETRON HCL 4 MG/2ML IJ SOLN
INTRAMUSCULAR | Status: AC
  Administered 2012-06-11: 4 mg
  Filled 2012-06-11: qty 2

## 2012-06-11 MED ORDER — SODIUM CHLORIDE 0.9 % IV SOLN
INTRAVENOUS | Status: AC
  Administered 2012-06-11 (×2): 75 mL/h via INTRAVENOUS

## 2012-06-11 MED ORDER — PNEUMOCOCCAL VAC POLYVALENT 25 MCG/0.5ML IJ INJ
0.5000 mL | INJECTION | INTRAMUSCULAR | Status: AC
  Administered 2012-06-12: 0.5 mL via INTRAMUSCULAR
  Filled 2012-06-11: qty 0.5

## 2012-06-11 MED ORDER — SODIUM CHLORIDE 0.9 % IV SOLN
Freq: Once | INTRAVENOUS | Status: AC
  Administered 2012-06-11: 05:00:00 via INTRAVENOUS

## 2012-06-11 MED ORDER — HYDROMORPHONE HCL PF 1 MG/ML IJ SOLN
1.0000 mg | Freq: Once | INTRAMUSCULAR | Status: AC
  Administered 2012-06-11: 1 mg via INTRAVENOUS
  Filled 2012-06-11: qty 1

## 2012-06-11 MED ORDER — ASPIRIN 325 MG PO TABS
325.0000 mg | ORAL_TABLET | Freq: Every day | ORAL | Status: DC
  Administered 2012-06-11 – 2012-06-12 (×2): 325 mg via ORAL
  Filled 2012-06-11 (×4): qty 1

## 2012-06-11 MED ORDER — MORPHINE SULFATE 2 MG/ML IJ SOLN
1.0000 mg | INTRAMUSCULAR | Status: DC | PRN
  Administered 2012-06-12: 1 mg via INTRAVENOUS
  Filled 2012-06-11: qty 1

## 2012-06-11 MED ORDER — SODIUM CHLORIDE 0.9 % IJ SOLN
3.0000 mL | Freq: Two times a day (BID) | INTRAMUSCULAR | Status: DC
  Administered 2012-06-11 – 2012-06-12 (×3): 3 mL via INTRAVENOUS

## 2012-06-11 MED ORDER — ONDANSETRON HCL 4 MG/2ML IJ SOLN
4.0000 mg | Freq: Once | INTRAMUSCULAR | Status: AC
Start: 2012-06-11 — End: 2012-06-11

## 2012-06-11 MED ORDER — ONDANSETRON HCL 4 MG/2ML IJ SOLN
INTRAMUSCULAR | Status: AC
  Administered 2012-06-11: 4 mg via INTRAVENOUS
  Filled 2012-06-11: qty 2

## 2012-06-11 MED ORDER — FUROSEMIDE 10 MG/ML IJ SOLN
40.0000 mg | Freq: Once | INTRAMUSCULAR | Status: AC
  Administered 2012-06-11: 40 mg via INTRAVENOUS
  Filled 2012-06-11: qty 4

## 2012-06-11 MED ORDER — DIGOXIN 125 MCG PO TABS
0.1250 mg | ORAL_TABLET | Freq: Every day | ORAL | Status: DC
  Administered 2012-06-11 – 2012-06-12 (×2): 0.125 mg via ORAL
  Filled 2012-06-11 (×4): qty 1

## 2012-06-11 NOTE — Progress Notes (Signed)
856 11 beat Vt and 1056 bigeminy. PT stabe vs, asym at time MG wdl K na. md aware, willl continue to monitor

## 2012-06-11 NOTE — Progress Notes (Signed)
PT Cancellation Note  Patient Details Name: Tyler Christensen MRN: [id number] DOB: 05/19/1933   Cancelled Treatment:    Reason Eval/Treat Not Completed: Patient at procedure or test/unavailable (Pt OOR and not able to participate)   Delaney Meigs, PT [phone]

## 2012-06-11 NOTE — ED Notes (Signed)
Patient arrived via EMS  Unable to determine exactly what the problem was due to the language barrier.  Int #1102 on the int line.  Family member states he started shaking and not breathing properly.  Stated it sounded like he was gasping for air  Patient now alert, knows the year but not the month.

## 2012-06-11 NOTE — Progress Notes (Signed)
Utilization review completed.  

## 2012-06-11 NOTE — ED Provider Notes (Signed)
History     CSN: [id number]  Arrival date & time 06/11/12  0311   First MD Initiated Contact with Patient 06/11/12 0325      Chief Complaint  Patient presents with  . Seizures    (Consider location/radiation/quality/duration/timing/severity/associated sxs/prior treatment) HPI Comments: Level 5 caveat due to language.  Patient's family member reports that the patient seemed to have gurgling breathing and was not responding to her. She called EMS who gave her instructions regarding mouth-to-mouth resuscitation and CPR. However in further questioning, the patient was breathing although seemed labored and he also was shaking on one side of his body and stiffened on the right side of his body. The patient tells me that he does recall having crampy-like sensation of his right upper extremity currently. He knows that he is at the hospital and that it is 2013, however is confused as far as a month. Family member reports that he was confused prior to arrival and was speaking nonsensically. She was worried that he had had a cardiac event. He denies any headache, chest pain or abdominal pain. He does complain of being sweaty at this time. He denies any back pain, nausea or vomiting. He reports he does feel short of breath and fatigue which based on his prior history, he has complained of in the past. No recent URI symptoms. No history of liver disease. He denies smoking, drinking or drug use. Patient's history is also significant for prior hernia repair. He takes medication for cholesterol, high blood pressure and he is also on digoxin. No prior history of stroke or seizure.  Patient is a 78 y.o. male presenting with seizures. The history is provided by the patient and a relative. The history is limited by a language barrier. A language interpreter was used.  Seizures     Past Medical History  Diagnosis Date  . HTN (hypertension)   . High cholesterol   . CHF (congestive heart failure)   . BBBB  (bilateral bundle branch block)   . LBBB (left bundle branch block)   . Weakness   . Tired   . SOB (shortness of breath)     History reviewed. No pertinent past surgical history.  History reviewed. No pertinent family history.  History  Substance Use Topics  . Smoking status: Never Smoker   . Smokeless tobacco: Not on file  . Alcohol Use: No      Review of Systems  Unable to perform ROS: Other  Constitutional: Positive for fatigue.  Respiratory: Positive for shortness of breath.     Allergies  Review of patient's allergies indicates no known allergies.  Home Medications   Current Outpatient Rx  Name  Route  Sig  Dispense  Refill  . CARVEDILOL 6.25 MG PO TABS   Oral   Take 6.25 mg by mouth 2 (two) times daily with a meal.         . CHOLINE FENOFIBRATE 45 MG PO CPDR   Oral   Take 45 mg by mouth daily.         Marland Kitchen DIGOXIN 0.125 MG PO TABS   Oral   Take 0.125 mg by mouth daily.         . FUROSEMIDE 40 MG PO TABS   Oral   Take 40 mg by mouth 2 (two) times daily.         Marland Kitchen LORATADINE 10 MG PO TABS   Oral   Take 10 mg by mouth daily.         Marland Kitchen  RANITIDINE HCL 300 MG PO TABS   Oral   Take 300 mg by mouth daily.         Marland Kitchen SIMVASTATIN 40 MG PO TABS   Oral   Take 40 mg by mouth daily.         Marland Kitchen SPIRONOLACTONE 25 MG PO TABS   Oral   Take 25 mg by mouth daily.           BP 125/80  Pulse 68  Temp 97.3 F (36.3 C) (Oral)  Resp 25  SpO2 99%  Physical Exam  Nursing note and vitals reviewed. Constitutional: He appears well-developed and well-nourished.  HENT:  Head: Normocephalic and atraumatic.  Eyes: EOM are normal. Pupils are equal, round, and reactive to light. No scleral icterus.  Neck: Neck supple.  Cardiovascular: Normal rate.   Pulmonary/Chest: Effort normal. No respiratory distress. He has no wheezes. He has no rales.  Abdominal: Soft. He exhibits no distension. There is no tenderness. There is no guarding.  Neurological: He is  alert. No sensory deficit. He exhibits normal muscle tone. GCS eye subscore is 4. GCS verbal subscore is 4. GCS motor subscore is 6.  Reflex Scores:      Patellar reflexes are 1+ on the right side and 1+ on the left side.      Normal finger to nose bilaterally. No significant upper extremity drift, 4/5 lower extremity strength bilaterally.  Skin: Skin is warm. No rash noted. He is diaphoretic.    ED Course  Procedures (including critical care time)  Labs Reviewed  CBC WITH DIFFERENTIAL - Abnormal; Notable for the following:    WBC 13.1 (*)     Neutrophils Relative 79 (*)     Neutro Abs 10.3 (*)     All other components within normal limits  COMPREHENSIVE METABOLIC PANEL - Abnormal; Notable for the following:    Glucose, Bld 183 (*)     BUN 31 (*)     Albumin 3.3 (*)     Alkaline Phosphatase 139 (*)     GFR calc non Af Amer 61 (*)     GFR calc Af Amer 71 (*)     All other components within normal limits  PRO B NATRIURETIC PEPTIDE - Abnormal; Notable for the following:    Pro B Natriuretic peptide (BNP) 15163.0 (*)     All other components within normal limits  DIGOXIN LEVEL - Abnormal; Notable for the following:    Digoxin Level 0.7 (*)     All other components within normal limits  GLUCOSE, CAPILLARY - Abnormal; Notable for the following:    Glucose-Capillary 186 (*)     All other components within normal limits  TROPONIN I  ETHANOL  URINALYSIS, ROUTINE W REFLEX MICROSCOPIC   Ct Head Wo Contrast  06/11/2012  *RADIOLOGY REPORT*  Clinical Data: Shaking; gasping for air.  Somewhat disoriented.  CT HEAD WITHOUT CONTRAST  Technique:  Contiguous axial images were obtained from the base of the skull through the vertex without contrast.  Comparison: None.  Findings: There is no evidence of acute infarction, mass lesion, or intra- or extra-axial hemorrhage on CT.  Prominence of the ventricles and sulci reflects mild cortical volume loss.  Mild cerebellar atrophy is noted.  The brainstem  and fourth ventricle are within normal limits.  The basal ganglia are unremarkable in appearance.  The cerebral hemispheres demonstrate grossly normal gray-white differentiation. No mass effect or midline shift is seen.  There is no evidence of fracture;  visualized osseous structures are unremarkable in appearance.  The visualized portions of the orbits are within normal limits.  Mucosal thickening is noted within the left maxillary sinus; the remaining paranasal sinuses and mastoid air cells are well-aerated.  No significant soft tissue abnormalities are seen.  IMPRESSION:  1.  No acute intracranial pathology seen on CT. 2.  Mild cortical volume loss.   Original Report Authenticated By: Tonia Ghent, M.D.    Dg Chest Port 1 View  06/11/2012  *RADIOLOGY REPORT*  Clinical Data: Chest pain and shortness of breath; shaking.  PORTABLE CHEST - 1 VIEW  Comparison: Chest radiograph performed 04/04/2012  Findings: The lungs are well-aerated.  Mild bilateral atelectasis is noted.  There is no evidence of pleural effusion or pneumothorax.  The cardiomediastinal silhouette is enlarged; calcification is noted within the aortic arch.  No acute osseous abnormalities are seen.  IMPRESSION: Mild bilateral atelectasis noted; stable cardiomegaly.   Original Report Authenticated By: Tonia Ghent, M.D.      1. Seizure   2. CHF (congestive heart failure)     ECG at time 03:53, shows sinus rhythm at a rate of 71, first-degree AV block, atypical left bundle branch block with left ventricular hypertrophy. Nonspecific ST and T wave abnormalities likely associated with a left bundle branch block.  5:59 AM No further seizure activity, not in status.  Pt with elevated BNP, complaints of SOB.  I suspect that the likely seizure or event last night caused pt to go into some pulmonary edema and CHF exacerbation.  I spoke to [facility] who will admit the patient.  I reviewed PCXR and head CT and reviewed radiologist interpretation.  No  mass or bleed or obv acute stroke on CT.  Troponin is neg.    MDM  Pt with history suggestive of first seizure.  Unusual given age.  Will get head CT, check labs, drug screen.  May require admission.          Gavin Pound. Oletta Lamas, MD 06/11/12 [phone]

## 2012-06-11 NOTE — Progress Notes (Signed)
EEG completed.

## 2012-06-11 NOTE — Evaluation (Signed)
Clinical/Bedside Swallow Evaluation Patient Details  Name: Ezana Hubbert MRN: [id number] Date of Birth: 24-Aug-1932  Today's Date: 06/11/2012 Time: 8295-6213 SLP Time Calculation (min): 25 min  Past Medical History:  Past Medical History  Diagnosis Date  . HTN (hypertension)   . High cholesterol   . CHF (congestive heart failure)   . BBBB (bilateral bundle branch block)   . LBBB (left bundle branch block)   . Weakness   . Tired   . SOB (shortness of breath)    Past Surgical History: History reviewed. No pertinent past surgical history. HPI:  77 year old male with new onset seizure activity.  Etiology unclear.  Medications have been reviewed.  Lab work reveals some mild dehydration and evidence of congestive heart failure but no other significant abnormalities.  MRI shows Acute non hemorrhagic small infarct posterior right temporal parietal lobe, tiny acute infarct in right corona radiata, and tiny infarct in right parietal loba.    Assessment / Plan / Recommendation Clinical Impression  Pt presents with normal oropharyngeal function. No overt signs of aspiration. Pt may initate a regular texture diet and thin liquids. Advised pt on basic aspiration precautions. Pts speech, language and cognition screended in setting of CVA, no residual deficits (pt assessed in spanish). No SLP f/u needed.     Aspiration Risk  None    Diet Recommendation Regular;Thin liquid   Liquid Administration via: Cup;Straw Medication Administration: Whole meds with liquid Supervision: Patient able to self feed Postural Changes and/or Swallow Maneuvers: Seated upright 90 degrees    Other  Recommendations Oral Care Recommendations: Patient independent with oral care   Follow Up Recommendations  None    Frequency and Duration        Pertinent Vitals/Pain NA    SLP Swallow Goals     Swallow Study Prior Functional Status       General HPI: 78 year old male with new onset seizure activity.   Etiology unclear.  Medications have been reviewed.  Lab work reveals some mild dehydration and evidence of congestive heart failure but no other significant abnormalities.  MRI shows Acute non hemorrhagic small infarct posterior right temporal parietal lobe, tiny acute infarct in right corona radiata, and tiny infarct in right parietal loba.  Type of Study: Bedside swallow evaluation Diet Prior to this Study: NPO Temperature Spikes Noted: No Respiratory Status: Room air History of Recent Intubation: No Behavior/Cognition: Alert;Cooperative;Pleasant mood Oral Cavity - Dentition: Dentures, bottom;Dentures, not available Self-Feeding Abilities: Able to feed self Patient Positioning: Upright in bed Baseline Vocal Quality: Clear Volitional Cough: Strong Volitional Swallow: Able to elicit    Oral/Motor/Sensory Function Overall Oral Motor/Sensory Function: Appears within functional limits for tasks assessed   Ice Chips     Thin Liquid Thin Liquid: Within functional limits Presentation: Cup;Straw    Nectar Thick Nectar Thick Liquid: Not tested   Honey Thick Honey Thick Liquid: Not tested   Puree Puree: Within functional limits   Solid   GO    Solid: Within functional limits      [facility], MA CCC-SLP [phone]  Claudine Mouton 06/11/2012,4:03 PM

## 2012-06-11 NOTE — Consult Note (Addendum)
Reason for Consult:Seizure  Referring Physician: David Stall  CC: Seizure  HPI: Tyler Christensen is an 78 y.o. male who was at home in bed.  Was noted at about 3AM to have what is described as a seizure.  What she describes today is more of a generalized tonic-clonic description.  From ED notes it seems that right sided focal actvity may have been noted at some point in time as well.  There was no associated tongue biting but there was urinary incontinence.  EMS was called at that time.  Patient's wife provides much of the history through an interpreter.  She reports that he has never has a seizure.  Has had no recent head injury or infection.    Past Medical History  Diagnosis Date  . HTN (hypertension)   . High cholesterol   . CHF (congestive heart failure)   . BBBB (bilateral bundle branch block)   . LBBB (left bundle branch block)   . Weakness   . Tired   . SOB (shortness of breath)     Past surgical history: hernia repair  Family history: Multiple family members have died of heart disease  Social History:  reports that he has never smoked. He does not have any smokeless tobacco history on file. He reports that he does not drink alcohol or use illicit drugs.  No Known Allergies  Medications:  I have reviewed the patient's current medications. Prior to Admission:  Prescriptions prior to admission  Medication Sig Dispense Refill  . carvedilol (COREG) 6.25 MG tablet Take 6.25 mg by mouth 2 (two) times daily with a meal.      . Choline Fenofibrate (TRILIPIX) 45 MG capsule Take 45 mg by mouth daily.      . digoxin (LANOXIN) 0.125 MG tablet Take 0.125 mg by mouth daily.      . furosemide (LASIX) 40 MG tablet Take 40 mg by mouth 2 (two) times daily.      Marland Kitchen loratadine (CLARITIN) 10 MG tablet Take 10 mg by mouth daily.      . ranitidine (ZANTAC) 300 MG tablet Take 300 mg by mouth daily.      . simvastatin (ZOCOR) 40 MG tablet Take 40 mg by mouth daily.      Marland Kitchen spironolactone (ALDACTONE)  25 MG tablet Take 25 mg by mouth daily.       Scheduled:   . carvedilol  6.25 mg Oral BID WC  . digoxin  0.125 mg Oral Daily  . sodium chloride  3 mL Intravenous Q12H  . spironolactone  25 mg Oral Daily    ROS: History obtained from wife  General ROS: fatigue Psychological ROS: negative for - behavioral disorder, hallucinations, memory difficulties, mood swings or suicidal ideation Ophthalmic ROS: negative for - blurry vision, double vision, eye pain or loss of vision ENT ROS: negative for - epistaxis, nasal discharge, oral lesions, sore throat, tinnitus or vertigo Allergy and Immunology ROS: negative for - hives or itchy/watery eyes Hematological and Lymphatic ROS: negative for - bleeding problems, bruising or swollen lymph nodes Endocrine ROS: negative for - galactorrhea, hair pattern changes, polydipsia/polyuria or temperature intolerance Respiratory ROS: shortness of breath Cardiovascular ROS: negative for - chest pain, dyspnea on exertion, edema or irregular heartbeat Gastrointestinal ROS: negative for - abdominal pain, nausea/vomiting Genito-Urinary ROS: negative for - dysuria, hematuria, incontinence or urinary frequency/urgency Musculoskeletal ROS: negative for - joint swelling or muscular weakness Neurological ROS: as noted in HPI Dermatological ROS: negative for rash and skin lesion  changes  Physical Examination: Blood pressure 117/65, pulse 46, temperature 97.3 F (36.3 C), temperature source Oral, resp. rate 17, SpO2 98.00%.  Neurologic Examination Mental Status: Alert, oriented to name place and age.  Speech fluent without evidence of aphasia.  Able to follow 3 step commands without difficulty. Cranial Nerves: II: Discs flat bilaterally; Visual fields grossly normal, pupils, reactive to light and accommodation III,IV, VI: ptosis not present, extra-ocular motions intact bilaterally V,VII: smile symmetric, facial light touch sensation normal bilaterally VIII: hearing  normal bilaterally IX,X: gag reflex present XI: bilateral shoulder shrug XII: midline tongue extension Motor: Right : Upper extremity   5/5    Left:     Upper extremity   5/5  Lower extremity   5/5     Lower extremity   5/5 Tone and bulk:normal tone throughout; no atrophy noted Sensory: Pinprick and light touch intact throughout, bilaterally Deep Tendon Reflexes: 2+ and symmetric throughout with absent AJ's bilaterally Plantars: Right: upgoing   Left: upgoing Cerebellar: normal finger-to-nose and normal heel-to-shin test Gait: unable to test CV: pulses palpable throughout   Laboratory Studies:   Basic Metabolic Panel:  Lab 06/11/12 1610  NA 135  K 3.7  CL 98  CO2 24  GLUCOSE 183*  BUN 31*  CREATININE 1.12  CALCIUM 8.8  MG --  PHOS --    Liver Function Tests:  Lab 06/11/12 0401  AST 32  ALT 30  ALKPHOS 139*  BILITOT 0.5  PROT 6.4  ALBUMIN 3.3*   No results found for this basename: LIPASE:5,AMYLASE:5 in the last 168 hours No results found for this basename: AMMONIA:3 in the last 168 hours  CBC:  Lab 06/11/12 0401  WBC 13.1*  NEUTROABS 10.3*  HGB 14.4  HCT 42.1  MCV 93.6  PLT 247    Cardiac Enzymes:  Lab 06/11/12 0401  CKTOTAL --  CKMB --  CKMBINDEX --  TROPONINI <0.30    BNP: No components found with this basename: POCBNP:5  CBG:  Lab 06/11/12 0414  GLUCAP 186*    Microbiology: No results found for this or any previous visit.  Coagulation Studies: No results found for this basename: LABPROT:5,INR:5 in the last 72 hours  Urinalysis:  Lab 06/11/12 0612  COLORURINE YELLOW  LABSPEC 1.010  PHURINE 5.5  GLUCOSEU NEGATIVE  HGBUR NEGATIVE  BILIRUBINUR NEGATIVE  KETONESUR NEGATIVE  PROTEINUR NEGATIVE  UROBILINOGEN 0.2  NITRITE NEGATIVE  LEUKOCYTESUR NEGATIVE    Lipid Panel:  No results found for this basename: chol, trig, hdl, cholhdl, vldl, ldlcalc    HgbA1C:  No results found for this basename: HGBA1C    Urine Drug Screen:    No results found for this basename: labopia, cocainscrnur, labbenz, amphetmu, thcu, labbarb    Alcohol Level:  Lab 06/11/12 0414  [facility] <11    Imaging: Ct Head Wo Contrast  06/11/2012  *RADIOLOGY REPORT*  Clinical Data: Shaking; gasping for air.  Somewhat disoriented.  CT HEAD WITHOUT CONTRAST  Technique:  Contiguous axial images were obtained from the base of the skull through the vertex without contrast.  Comparison: None.  Findings: There is no evidence of acute infarction, mass lesion, or intra- or extra-axial hemorrhage on CT.  Prominence of the ventricles and sulci reflects mild cortical volume loss.  Mild cerebellar atrophy is noted.  The brainstem and fourth ventricle are within normal limits.  The basal ganglia are unremarkable in appearance.  The cerebral hemispheres demonstrate grossly normal gray-white differentiation. No mass effect or midline shift is seen.  There is no evidence of fracture; visualized osseous structures are unremarkable in appearance.  The visualized portions of the orbits are within normal limits.  Mucosal thickening is noted within the left maxillary sinus; the remaining paranasal sinuses and mastoid air cells are well-aerated.  No significant soft tissue abnormalities are seen.  IMPRESSION:  1.  No acute intracranial pathology seen on CT. 2.  Mild cortical volume loss.   Original Report Authenticated By: Tonia Ghent, M.D.    Dg Chest Port 1 View  06/11/2012  *RADIOLOGY REPORT*  Clinical Data: Chest pain and shortness of breath; shaking.  PORTABLE CHEST - 1 VIEW  Comparison: Chest radiograph performed 04/04/2012  Findings: The lungs are well-aerated.  Mild bilateral atelectasis is noted.  There is no evidence of pleural effusion or pneumothorax.  The cardiomediastinal silhouette is enlarged; calcification is noted within the aortic arch.  No acute osseous abnormalities are seen.  IMPRESSION: Mild bilateral atelectasis noted; stable cardiomegaly.   Original Report  Authenticated By: Tonia Ghent, M.D.    Dg Abd 2 Views  06/11/2012  *RADIOLOGY REPORT*  Clinical Data: Nausea, vomiting, abdominal pain and distension.  ABDOMEN - 2 VIEW  Comparison: None.  Findings: The visualized bowel gas pattern is unremarkable. Scattered air and stool filled loops of colon are seen; no abnormal dilatation of small bowel loops is seen to suggest small bowel obstruction.  No free intra-abdominal air is identified, though evaluation for free air is limited on a single supine view.  The visualized osseous structures are within normal limits; the sacroiliac joints are unremarkable in appearance.  Mild left basilar opacity likely reflects atelectasis.  IMPRESSION:  1.  Unremarkable bowel gas pattern; no free intra-abdominal air seen. 2.  Mild left basilar airspace opacity likely reflects atelectasis.   Original Report Authenticated By: Tonia Ghent, M.D.      Assessment/Plan:  78 year old male with new onset seizure activity.  Etiology unclear.  Medications have been reviewed.  Lab work reveals some mild dehydration and evidence of congestive heart failure but no other significant abnormalities.  Head CT shows no acute changes.  Further work up indicated.    Recommendations: 1.  MRI of the brain without contrast 2.  EEG 3.  Seizure precautions 4.  Ativan prn for seizure activity 5.  Would not start anticonvulsants at this time. 5.  Serum magnesium and phosphorus  Case discussed with Dr. Jeralene Huff, MD [facility] [phone] 06/11/2012, 9:03 AM

## 2012-06-11 NOTE — H&P (Signed)
PCP:   Jearld Lesch, MD   Chief Complaint:  n/v  HPI: 78 yo male h/o chf, htn came in with family member found him "shaking" in the bed jerking and gasping for air.  Pt thought to have seizure activity.  However he has been vomiting most of the night which has progressively gotten worse and he feels bloated.  Communication is issue, patient and relative speak some [place].  Vomit has been nonbloody.  No diarrhea.  No fevers. He has been sweating and he gets the chills.  Denies any sob or cp now.  He does not recall exactly what happened when he was "gasping".   Asked to admit for seizure work up and possible chf exac.    Review of Systems:  Ow neg   Past Medical History: Past Medical History  Diagnosis Date  . HTN (hypertension)   . High cholesterol   . CHF (congestive heart failure)   . BBBB (bilateral bundle branch block)   . LBBB (left bundle branch block)   . Weakness   . Tired   . SOB (shortness of breath)    History reviewed. No pertinent past surgical history.  Medications: Prior to Admission medications   Medication Sig Start Date End Date Taking? Authorizing Provider  carvedilol (COREG) 6.25 MG tablet Take 6.25 mg by mouth 2 (two) times daily with a meal.   Yes Historical Provider, MD  Choline Fenofibrate (TRILIPIX) 45 MG capsule Take 45 mg by mouth daily.   Yes Historical Provider, MD  digoxin (LANOXIN) 0.125 MG tablet Take 0.125 mg by mouth daily.   Yes Historical Provider, MD  furosemide (LASIX) 40 MG tablet Take 40 mg by mouth 2 (two) times daily.   Yes Historical Provider, MD  loratadine (CLARITIN) 10 MG tablet Take 10 mg by mouth daily.   Yes Historical Provider, MD  ranitidine (ZANTAC) 300 MG tablet Take 300 mg by mouth daily.   Yes Historical Provider, MD  simvastatin (ZOCOR) 40 MG tablet Take 40 mg by mouth daily.   Yes Historical Provider, MD  spironolactone (ALDACTONE) 25 MG tablet Take 25 mg by mouth daily.   Yes Historical Provider, MD     Allergies:  No Known Allergies  Social History:  reports that he has never smoked. He does not have any smokeless tobacco history on file. He reports that he does not drink alcohol or use illicit drugs.  Family History: History reviewed. No pertinent family history.  Physical Exam: Filed Vitals:   06/11/12 0500 06/11/12 0515 06/11/12 0530 06/11/12 0545  BP: 109/64 110/68 113/54 125/80  Pulse: 66 67 64 68  Temp:      TempSrc:      Resp: 20 20 20 25   SpO2: 98% 98% 100% 99%   General appearance: alert, cooperative and no distress Neck: no JVD and supple, symmetrical, trachea midline Lungs: clear to auscultation bilaterally Heart: regular rate and rhythm, S1, S2 normal, no murmur, click, rub or gallop Abdomen: soft distended, mild diffuse ttp, pos bs no r/g.  nonacute abd Extremities: extremities normal, atraumatic, no cyanosis or edema Pulses: 2+ and symmetric Skin: Skin color, texture, turgor normal. No rashes or lesions Neurologic: Grossly normal  Labs on Admission:   [facility] 06/11/12 0401  NA 135  K 3.7  CL 98  CO2 24  GLUCOSE 183*  BUN 31*  CREATININE 1.12  CALCIUM 8.8  MG --  PHOS --    Basename 06/11/12 0401  AST 32  ALT 30  ALKPHOS  139*  BILITOT 0.5  PROT 6.4  ALBUMIN 3.3*    Basename 06/11/12 0401  WBC 13.1*  NEUTROABS 10.3*  HGB 14.4  HCT 42.1  MCV 93.6  PLT 247    Basename 06/11/12 0401  CKTOTAL --  CKMB --  CKMBINDEX --  TROPONINI <0.30    Radiological Exams on Admission: Ct Head Wo Contrast  06/11/2012  *RADIOLOGY REPORT*  Clinical Data: Shaking; gasping for air.  Somewhat disoriented.  CT HEAD WITHOUT CONTRAST  Technique:  Contiguous axial images were obtained from the base of the skull through the vertex without contrast.  Comparison: None.  Findings: There is no evidence of acute infarction, mass lesion, or intra- or extra-axial hemorrhage on CT.  Prominence of the ventricles and sulci reflects mild cortical volume loss.  Mild  cerebellar atrophy is noted.  The brainstem and fourth ventricle are within normal limits.  The basal ganglia are unremarkable in appearance.  The cerebral hemispheres demonstrate grossly normal gray-white differentiation. No mass effect or midline shift is seen.  There is no evidence of fracture; visualized osseous structures are unremarkable in appearance.  The visualized portions of the orbits are within normal limits.  Mucosal thickening is noted within the left maxillary sinus; the remaining paranasal sinuses and mastoid air cells are well-aerated.  No significant soft tissue abnormalities are seen.  IMPRESSION:  1.  No acute intracranial pathology seen on CT. 2.  Mild cortical volume loss.   Original Report Authenticated By: Tonia Ghent, M.D.    Dg Chest Port 1 View  06/11/2012  *RADIOLOGY REPORT*  Clinical Data: Chest pain and shortness of breath; shaking.  PORTABLE CHEST - 1 VIEW  Comparison: Chest radiograph performed 04/04/2012  Findings: The lungs are well-aerated.  Mild bilateral atelectasis is noted.  There is no evidence of pleural effusion or pneumothorax.  The cardiomediastinal silhouette is enlarged; calcification is noted within the aortic arch.  No acute osseous abnormalities are seen.  IMPRESSION: Mild bilateral atelectasis noted; stable cardiomegaly.   Original Report Authenticated By: Tonia Ghent, M.D.     Assessment/Plan  78 yo male with abd distention, n/v and possible aspiration vs seizure Principal Problem:  *Seizure Active Problems:  CHF (congestive heart failure)  HTN (hypertension)  Nausea and vomiting  Likely has ileus or psbo.  Will obtain abd xrays.  Keep npo and gentle ivf.  Has h/o chf and will ck echo.  Do not think his chf is decompensated right now, think the main issue is GI.  Wonder if he maybe aspirated in the middle of the night, lungs are clear now and cxr is neg.  ???could have been a seizure.  vss at this time.  ekg old bbb.  Neurology also to evaluate  pt and called by edp.  Cont to serial his cardiac enzymes.  Tele floor.     DAVID,RACHAL A 06/11/2012, 6:12 AM

## 2012-06-11 NOTE — Progress Notes (Signed)
Bilateral:  No evidence of hemodynamically significant internal carotid artery stenosis.   Vertebral artery flow is antegrade.     

## 2012-06-11 NOTE — ED Notes (Signed)
Patient did not want his temperature checked via rectum.  Requested to get up to use the toilet for a BM  Instructed him we needed to use a bedpan.  Patient said he would wait.  Instructed him and the family member that we needed urine

## 2012-06-11 NOTE — Progress Notes (Signed)
-   agree with management. X-ray normal bowel gas pattern. Gentle hydration. Echo pending. - seizure EEG pending. Electrolytes pending. - MRI new temporal lobe stroke. Will d/w neurology anticonvulsive medications.

## 2012-06-11 NOTE — Progress Notes (Signed)
  Echocardiogram 2D Echocardiogram has been performed.  Tyler Christensen 06/11/2012, 2:44 PM

## 2012-06-11 NOTE — Progress Notes (Signed)
MD aware of EEG result

## 2012-06-12 DIAGNOSIS — I4891 Unspecified atrial fibrillation: Secondary | ICD-10-CM

## 2012-06-12 DIAGNOSIS — I6340 Cerebral infarction due to embolism of unspecified cerebral artery: Principal | ICD-10-CM

## 2012-06-12 DIAGNOSIS — I5022 Chronic systolic (congestive) heart failure: Secondary | ICD-10-CM

## 2012-06-12 DIAGNOSIS — I639 Cerebral infarction, unspecified: Secondary | ICD-10-CM

## 2012-06-12 HISTORY — DX: Unspecified atrial fibrillation: I48.91

## 2012-06-12 LAB — LIPID PANEL
Cholesterol: 128 mg/dL (ref 0–200)
HDL: 27 mg/dL — ABNORMAL LOW (ref >39)
Total CHOL/HDL Ratio: 4.7 RATIO
Triglycerides: 113 mg/dL (ref <150)
VLDL: 23 mg/dL (ref 0–40)

## 2012-06-12 LAB — GLUCOSE, CAPILLARY: Glucose-Capillary: 132 mg/dL — ABNORMAL HIGH (ref 70–99)

## 2012-06-12 MED ORDER — FUROSEMIDE 40 MG PO TABS
40.0000 mg | ORAL_TABLET | Freq: Two times a day (BID) | ORAL | Status: DC
  Administered 2012-06-12: 40 mg via ORAL
  Filled 2012-06-12 (×2): qty 1

## 2012-06-12 MED ORDER — WARFARIN SODIUM 4 MG PO TABS: 4.0000 mg | ORAL_TABLET | Freq: Every day | ORAL | Status: DC

## 2012-06-12 MED ORDER — HYDROCODONE-ACETAMINOPHEN 5-325 MG PO TABS: 1.0000 | ORAL_TABLET | Freq: Four times a day (QID) | ORAL | Status: DC | PRN

## 2012-06-12 MED ORDER — FUROSEMIDE 40 MG PO TABS
40.0000 mg | ORAL_TABLET | Freq: Two times a day (BID) | ORAL | Status: DC
  Filled 2012-06-12 (×2): qty 1

## 2012-06-12 NOTE — Evaluation (Signed)
Physical Therapy Evaluation Patient Details Name: Tyler Christensen MRN: [id number] DOB: July 30, 1932 Today's Date: 06/12/2012 Time: 4132-4401 PT Time Calculation (min): 38 min  PT Assessment / Plan / Recommendation Clinical Impression  Tyler Christensen is 78 y/o male admitted for seizure like activity. MRI Acute non hemorrhagic small infarct posterior right temporal - parietal lobe junction.  Tiny acute non hemorrhagic infarct posterior right corona radiata.  Tiny acute infarct right parietal lobe.  Global atrophy.  C4-5 cervical spondylotic changes with spinal stenosis and mild cord flattening.  Presents to PT today back to baseline with mobility. MD to address cervical issues and possible follow up at outpatient therapies vs neuro-surgery consult. No further PT needs at this time. Pt educated on walking program to address his fatigue with ambulation at longer distances. Pt appears very aware.     PT Assessment  Patent does not need any further PT services    Follow Up Recommendations  No PT follow up    Does the patient have the potential to tolerate intense rehabilitation      Barriers to Discharge        Equipment Recommendations  None recommended by PT    Recommendations for Other Services     Frequency      Precautions / Restrictions Precautions Precautions: None Restrictions Weight Bearing Restrictions: No   Pertinent Vitals/Pain C/o neck/shoulder pain when standing that resolves with sitting down. Pain also with lying down. Per MRI pt with C4-5 cervical spondylotic changes with spinal stenosis and mild cord flattening.       Mobility  Bed Mobility Bed Mobility: Not assessed Transfers Transfers: Sit to Stand;Stand to Sit Sit to Stand: 7: Independent Stand to Sit: 7: Independent Ambulation/Gait Ambulation/Gait Assistance: 6: Modified independent (Device/Increase time) Ambulation Distance (Feet): 450 Feet Assistive device: None Ambulation/Gait Assistance Details: dyspnea after  fast walking and increased dyspnea, pt independently stopped and rested when needed to catch his breath Gait Pattern: Within Functional Limits Stairs: Yes Stairs Assistance: 6: Modified independent (Device/Increase time) Stair Management Technique: One rail Left;Step to pattern Number of Stairs: 4         Visit Information  Last PT Received On: 06/12/12 Assistance Needed: +1    Subjective Data  Subjective: I have lived in this [place].    Prior Functioning  Home Living Lives With: Spouse Available Help at Discharge: Family;Available PRN/intermittently (wife works from 6pm until 1 am but is home during the day) Type of Home: House Home Access: Level entry Home Layout: One level Bathroom Shower/Tub: Health visitor: Standard Home Adaptive Equipment: None Prior Function Level of Independence: Independent Able to Take Stairs?: Yes Driving: Yes Vocation: Full time employment Comments: uses glasses during driving/reading  Dominant Hand: Right    Cognition  Overall Cognitive Status: Appears within functional limits for tasks assessed/performed Arousal/Alertness: Awake/alert Orientation Level: Appears intact for tasks assessed Behavior During Session: [facility] for tasks performed    Extremity/Trunk Assessment Right Upper Extremity Assessment RUE ROM/Strength/Tone: WFL for tasks assessed RUE Sensation: WFL - Light Touch RUE Coordination: WFL - gross/fine motor Left Upper Extremity Assessment LUE ROM/Strength/Tone: WFL for tasks assessed LUE Sensation: WFL - Light Touch LUE Coordination: WFL - gross/fine motor Right Lower Extremity Assessment RLE ROM/Strength/Tone: WFL for tasks assessed RLE Sensation: WFL - Light Touch RLE Coordination: WFL - gross/fine motor Left Lower Extremity Assessment LLE ROM/Strength/Tone: WFL for tasks assessed LLE Sensation: WFL - Light Touch LLE Coordination: WFL - gross/fine motor Trunk Assessment Trunk Assessment:  Normal   Balance High Level Balance High Level Balance Activites: Backward walking;Turns;Sudden stops;Head turns High Level Balance Comments: able to adjust speed accordingly with obstacles in the hallway  End of Session PT - End of Session Equipment Utilized During Treatment: Gait belt Activity Tolerance: Patient tolerated treatment well Patient left: in bed;with call bell/phone within reach Nurse Communication: Mobility status  GP     [facility] HELEN 06/12/2012, 1:54 PM

## 2012-06-12 NOTE — Discharge Summary (Signed)
Physician Discharge Summary  Tyler Christensen ZOX:[id number] DOB: [birth year] DOA: 06/11/2012  PCP: Jearld Lesch, MD  Admit date: 06/11/2012 Discharge date: 06/12/2012  Time spent: 60 minutes  Recommendations for Outpatient Follow-up:  1. Follow up with Coumadin clinic.    Discharge Diagnoses:  Principal Problem:  *Seizure Active Problems:  Chronic systolic congestive heart failure, NYHA class 2 EF 20 % 12.23.2013  HTN (hypertension)  Nausea and vomiting  multiple right brain embolic strokes  Atrial fibrillation   Discharge Condition: stable  Diet recommendation: low sodium diet.  Filed Weights   06/12/12 0521  Weight: 71.305 kg (157 lb 3.2 oz)    History of present illness:  78 yo male h/o chf, htn came in with family member found him "shaking" in the bed jerking and gasping for air. Pt thought to have seizure activity. However he has been vomiting most of the night which has progressively gotten worse and he feels bloated. Communication is issue, patient and relative speak some [place]. Vomit has been nonbloody. No diarrhea. No fevers. He has been sweating and he gets the chills. Denies any sob or cp now. He does not recall exactly what happened when he was "gasping". Asked to admit for seizure work up and possible chf exac.   Hospital Course:  Atrial fibrillation: - There is probably the cause of the stroke, probably going in and out of A. fib as an outpatient before coming into the hospital. - 2-D echo was done that showed an EF of 50% no clots. Neurology was consulted an admission for seizure. They recommended to start him on Coumadin low dose Coumadin drift and followup with the Coumadin clinic as an outpatient. - He initially came into the hospital in sinus rhythm and developed atrial fibrillation on telemetry. - He is currently rate controlled on Coreg, his heart rate has remained below 80 beats per minute.  Seizure: - This probably secondary to infarct posterior  right temporal - parietal lobe junction. Neurology was consulted they recommended Not to start any anticonvulsant medication as he has only had one episode of seizures this is probably secondary to his recent stroke. He'll follow up with neurology as an outpatient. - On the hospital he did not require Ativan or any other anticonvulsant as he had no further episodes of seizures.  Acuteinfarct posterior right temporal - parietal lobe junction: - He was started on Coumadin secondary to his atrial fibrillation as this is probably the cause of his right temporal infarction. MRI was done that showed results as below, 2-D echo was done that showed results as below. Physical therapy recommended no further physical therapy as an outpatient. - His blood pressure cannot tolerate an ACE inhibitor, his HDL was 27 so he was continued on statin therapy.   Chronic systolic heart failure: - He does have an EF of 20%, is currently on a beta blocker. His blood pressure cannot tolerate an ACE. He is on Lasix and spironolactone as an outpatient. - He has a old left bundle branch block with a QRS of 190, I will make a followup appointment with cardiology [facility] and cardiology to be evaluated for Dual chamber pacemaker and or AICD. This will have to be followup in done as an outpatient.  Procedures: - Echo 2.24.2013: ejection fraction was in the range of 15% to 20%. Diffuse Hypokinesis.   Consultations:  Neurology  Discharge Exam: Filed Vitals:   06/11/12 2150 06/12/12 0521 06/12/12 0837 06/12/12 1411  BP: 92/53 113/71 96/54 110/78  Pulse: 69 71 74 70  Temp: 98.3 F (36.8 C) 97.3 F (36.3 C)  97.3 F (36.3 C)  TempSrc: Oral Axillary  Oral  Resp:  18  16  Weight:  71.305 kg (157 lb 3.2 oz)    SpO2: 96%   97%    General: A & O x3 Cardiovascular: irregular rate and rhythm Respiratory: Good air movement CTA B/L.  Discharge Instructions      Discharge Orders    Future Orders  Please Complete By Expires   Diet - low sodium heart healthy      Increase activity slowly          Medication List     As of 06/12/2012  3:15 PM    TAKE these medications         carvedilol 6.25 MG tablet   Commonly known as: COREG   Take 6.25 mg by mouth 2 (two) times daily with a meal.      digoxin 0.125 MG tablet   Commonly known as: LANOXIN   Take 0.125 mg by mouth daily.      furosemide 40 MG tablet   Commonly known as: LASIX   Take 40 mg by mouth 2 (two) times daily.      HYDROcodone-acetaminophen 5-325 MG per tablet   Commonly known as: NORCO/VICODIN   Take 1 tablet by mouth every 6 (six) hours as needed for pain.      loratadine 10 MG tablet   Commonly known as: CLARITIN   Take 10 mg by mouth daily.      ranitidine 300 MG tablet   Commonly known as: ZANTAC   Take 300 mg by mouth daily.      simvastatin 40 MG tablet   Commonly known as: ZOCOR   Take 40 mg by mouth daily.      spironolactone 25 MG tablet   Commonly known as: ALDACTONE   Take 25 mg by mouth daily.      TRILIPIX 45 MG capsule   Generic drug: Choline Fenofibrate   Take 45 mg by mouth daily.      warfarin 4 MG tablet   Commonly known as: COUMADIN   Take 1 tablet (4 mg total) by mouth daily.         Follow-up Information    Follow up with Jearld Lesch, MD. In 2 weeks. (hospital follow up)    Contact information:   [address] [place] [address] [phone]           The results of significant diagnostics from this hospitalization (including imaging, microbiology, ancillary and laboratory) are listed below for reference.    Significant Diagnostic Studies: Ct Head Wo Contrast  06/11/2012  *RADIOLOGY REPORT*  Clinical Data: Shaking; gasping for air.  Somewhat disoriented.  CT HEAD WITHOUT CONTRAST  Technique:  Contiguous axial images were obtained from the base of the skull through the vertex without contrast.  Comparison: None.  Findings: There is no evidence of  acute infarction, mass lesion, or intra- or extra-axial hemorrhage on CT.  Prominence of the ventricles and sulci reflects mild cortical volume loss.  Mild cerebellar atrophy is noted.  The brainstem and fourth ventricle are within normal limits.  The basal ganglia are unremarkable in appearance.  The cerebral hemispheres demonstrate grossly normal gray-white differentiation. No mass effect or midline shift is seen.  There is no evidence of fracture; visualized osseous structures are unremarkable in appearance.  The visualized portions of the orbits are within  normal limits.  Mucosal thickening is noted within the left maxillary sinus; the remaining paranasal sinuses and mastoid air cells are well-aerated.  No significant soft tissue abnormalities are seen.  IMPRESSION:  1.  No acute intracranial pathology seen on CT. 2.  Mild cortical volume loss.   Original Report Authenticated By: Tonia Ghent, M.D.    Mr [facility] Wo Contrast  06/11/2012  *RADIOLOGY REPORT*  Clinical Data:  New onset seizures.  Hypertension and high cholesterol.  MRI BRAIN WITHOUT CONTRAST MRA HEAD WITHOUT CONTRAST  Technique: Multiplanar, multiecho pulse sequences of the brain and surrounding structures were obtained according to standard protocol without intravenous contrast.  Angiographic images of the head were obtained using MRA technique without contrast.  Comparison: 06/11/2012 head CT.  No comparison brain MR.  MRI HEAD  Findings:  Acute non hemorrhagic small infarct posterior right temporal - parietal lobe junction.  Tiny acute non hemorrhagic infarct posterior right corona radiata.  Tiny acute infarct right parietal lobe.  Abnormal flow within the right middle cerebral artery branches. Please see below.  Global atrophy without hydrocephalus.  No intracranial hemorrhage.  No intracranial mass lesion detected on this unenhanced exam.  Moderate mucosal thickening left maxillary sinus.  Cervical spondylotic changes with spinal stenosis  C4-5 with mild cord flattening.  IMPRESSION: Acute non hemorrhagic small infarct posterior right temporal - parietal lobe junction.  Tiny acute non hemorrhagic infarct posterior right corona radiata.  Tiny acute infarct right parietal lobe.  Global atrophy.  C4-5 cervical spondylotic changes with spinal stenosis and mild cord flattening.  Moderate mucosal thickening left maxillary sinus.  MRA HEAD  Findings: Abrupt cut off of flow to large segment of right posterior cerebral artery branches just beyond the right middle cerebral artery bifurcation.  No significant stenosis proximal to this level.  Right vertebral artery is dominant.  No high-grade stenosis of the vertebral arteries or basilar artery.  Mild narrowing proximal left PICA.  Poor delineation left AICA.  Slight irregularity superior cerebellar arteries bilaterally.  Mild irregularity posterior cerebral artery branch vessels with decreased visualization left posterior cerebral artery distal branches.  No aneurysm or vascular malformation noted.  IMPRESSION: Abrupt cut off of flow to large segment of right posterior cerebral artery branches just beyond the right middle cerebral artery bifurcation.  Please see above.  This has been made a PRA call report utilizing dashboard call feature.   Original Report Authenticated By: Lacy Duverney, M.D.    Mr Brain Wo Contrast  06/11/2012  *RADIOLOGY REPORT*  Clinical Data:  New onset seizures.  Hypertension and high cholesterol.  MRI BRAIN WITHOUT CONTRAST MRA HEAD WITHOUT CONTRAST  Technique: Multiplanar, multiecho pulse sequences of the brain and surrounding structures were obtained according to standard protocol without intravenous contrast.  Angiographic images of the head were obtained using MRA technique without contrast.  Comparison: 06/11/2012 head CT.  No comparison brain MR.  MRI HEAD  Findings:  Acute non hemorrhagic small infarct posterior right temporal - parietal lobe junction.  Tiny acute non  hemorrhagic infarct posterior right corona radiata.  Tiny acute infarct right parietal lobe.  Abnormal flow within the right middle cerebral artery branches. Please see below.  Global atrophy without hydrocephalus.  No intracranial hemorrhage.  No intracranial mass lesion detected on this unenhanced exam.  Moderate mucosal thickening left maxillary sinus.  Cervical spondylotic changes with spinal stenosis C4-5 with mild cord flattening.  IMPRESSION: Acute non hemorrhagic small infarct posterior right temporal - parietal lobe junction.  Tiny acute  non hemorrhagic infarct posterior right corona radiata.  Tiny acute infarct right parietal lobe.  Global atrophy.  C4-5 cervical spondylotic changes with spinal stenosis and mild cord flattening.  Moderate mucosal thickening left maxillary sinus.  MRA HEAD  Findings: Abrupt cut off of flow to large segment of right posterior cerebral artery branches just beyond the right middle cerebral artery bifurcation.  No significant stenosis proximal to this level.  Right vertebral artery is dominant.  No high-grade stenosis of the vertebral arteries or basilar artery.  Mild narrowing proximal left PICA.  Poor delineation left AICA.  Slight irregularity superior cerebellar arteries bilaterally.  Mild irregularity posterior cerebral artery branch vessels with decreased visualization left posterior cerebral artery distal branches.  No aneurysm or vascular malformation noted.  IMPRESSION: Abrupt cut off of flow to large segment of right posterior cerebral artery branches just beyond the right middle cerebral artery bifurcation.  Please see above.  This has been made a PRA call report utilizing dashboard call feature.   Original Report Authenticated By: Lacy Duverney, M.D.    Dg Chest Port 1 View  06/11/2012  *RADIOLOGY REPORT*  Clinical Data: Chest pain and shortness of breath; shaking.  PORTABLE CHEST - 1 VIEW  Comparison: Chest radiograph performed 04/04/2012  Findings: The lungs  are well-aerated.  Mild bilateral atelectasis is noted.  There is no evidence of pleural effusion or pneumothorax.  The cardiomediastinal silhouette is enlarged; calcification is noted within the aortic arch.  No acute osseous abnormalities are seen.  IMPRESSION: Mild bilateral atelectasis noted; stable cardiomegaly.   Original Report Authenticated By: Tonia Ghent, M.D.    Dg Abd 2 Views  06/11/2012  *RADIOLOGY REPORT*  Clinical Data: Nausea, vomiting, abdominal pain and distension.  ABDOMEN - 2 VIEW  Comparison: None.  Findings: The visualized bowel gas pattern is unremarkable. Scattered air and stool filled loops of colon are seen; no abnormal dilatation of small bowel loops is seen to suggest small bowel obstruction.  No free intra-abdominal air is identified, though evaluation for free air is limited on a single supine view.  The visualized osseous structures are within normal limits; the sacroiliac joints are unremarkable in appearance.  Mild left basilar opacity likely reflects atelectasis.  IMPRESSION:  1.  Unremarkable bowel gas pattern; no free intra-abdominal air seen. 2.  Mild left basilar airspace opacity likely reflects atelectasis.   Original Report Authenticated By: Tonia Ghent, M.D.     Microbiology: No results found for this or any previous visit (from the past 240 hour(s)).   Labs: Basic Metabolic Panel:  Lab 06/11/12 8119 06/11/12 0401  NA -- 135  K -- 3.7  CL -- 98  CO2 -- 24  GLUCOSE -- 183*  BUN -- 31*  CREATININE -- 1.12  CALCIUM -- 8.8  MG 1.8 --  PHOS 3.8 --   Liver Function Tests:  Lab 06/11/12 0401  AST 32  ALT 30  ALKPHOS 139*  BILITOT 0.5  PROT 6.4  ALBUMIN 3.3*   No results found for this basename: LIPASE:5,AMYLASE:5 in the last 168 hours No results found for this basename: AMMONIA:5 in the last 168 hours CBC:  Lab 06/11/12 0401  WBC 13.1*  NEUTROABS 10.3*  HGB 14.4  HCT 42.1  MCV 93.6  PLT 247   Cardiac Enzymes:  Lab 06/11/12 1919  06/11/12 1614 06/11/12 0905 06/11/12 0401  CKTOTAL -- -- -- --  CKMB -- -- -- --  CKMBINDEX -- -- -- --  TROPONINI <0.30 <0.30 <0.30 <0.30  BNP: BNP (last 3 results)  Basename 06/11/12 0401 04/04/12 1245  PROBNP 15163.0* 9002.0*   CBG:  Lab 06/12/12 0616 06/11/12 0414  GLUCAP 132* 186*       Signed:  Marinda Elk  [facility] 06/12/2012, 3:15 PM

## 2012-06-12 NOTE — Progress Notes (Signed)
Stroke Team Progress Note  HISTORY Tyler Christensen is an 78 y.o. male who was at home in bed. Was noted at about 3AM 06/11/2012 to have what is described as a seizure. What she describes today is more of a generalized tonic-clonic description. From ED notes it seems that right sided focal actvity may have been noted at some point in time as well. There was no associated tongue biting but there was urinary incontinence. EMS was called at that time. Patient's wife provides much of the history through an interpreter. She reports that he has never has a seizure. Has had no recent head injury or infection. Patient was not a TPA candidate upon arrival to the hospital secondary to new onset seizure and no focal neurologic symptoms. He was admitted to the neuro ICU for further evaluation and treatment.  SUBJECTIVE His wife is at the bedside.  Overall he feels his condition is stable. He went into new afib during the night.  OBJECTIVE Most recent Vital Signs: Filed Vitals:   06/11/12 0645 06/11/12 0700 06/11/12 2150 06/12/12 0521  BP: 116/72 117/65 92/53 113/71  Pulse: 66 46 69 71  Temp:   98.3 F (36.8 C) 97.3 F (36.3 C)  TempSrc:   Oral Axillary  Resp: 21 17  18   Weight:    71.305 kg (157 lb 3.2 oz)  SpO2: 96% 98% 96%    CBG (last 3)   Basename 06/12/12 0616 06/11/12 0414  GLUCAP 132* 186*    IV Fluid Intake:     MEDICATIONS    . aspirin  325 mg Oral Daily  . carvedilol  6.25 mg Oral BID WC  . digoxin  0.125 mg Oral Daily  . influenza  inactive virus vaccine  0.5 mL Intramuscular Tomorrow-1000  . pneumococcal 23 valent vaccine  0.5 mL Intramuscular Tomorrow-1000  . sodium chloride  3 mL Intravenous Q12H  . spironolactone  25 mg Oral Daily   PRN:  morphine injection, ondansetron (ZOFRAN) IV, ondansetron  Diet:  Cardiac thin liquids Activity:  Up with assistance DVT Prophylaxis:  SCDs  CLINICALLY SIGNIFICANT STUDIES Basic Metabolic Panel:  Lab 06/11/12 1610 06/11/12 0401  NA -- 135   K -- 3.7  CL -- 98  CO2 -- 24  GLUCOSE -- 183*  BUN -- 31*  CREATININE -- 1.12  CALCIUM -- 8.8  MG 1.8 --  PHOS 3.8 --   Liver Function Tests:  Lab 06/11/12 0401  AST 32  ALT 30  ALKPHOS 139*  BILITOT 0.5  PROT 6.4  ALBUMIN 3.3*   CBC:  Lab 06/11/12 0401  WBC 13.1*  NEUTROABS 10.3*  HGB 14.4  HCT 42.1  MCV 93.6  PLT 247   Coagulation: No results found for this basename: LABPROT:4,INR:4 in the last 168 hours Cardiac Enzymes:  Lab 06/11/12 1919 06/11/12 1614 06/11/12 0905  CKTOTAL -- -- --  CKMB -- -- --  CKMBINDEX -- -- --  TROPONINI <0.30 <0.30 <0.30   Urinalysis:  Lab 06/11/12 0612  COLORURINE YELLOW  LABSPEC 1.010  PHURINE 5.5  GLUCOSEU NEGATIVE  HGBUR NEGATIVE  BILIRUBINUR NEGATIVE  KETONESUR NEGATIVE  PROTEINUR NEGATIVE  UROBILINOGEN 0.2  NITRITE NEGATIVE  LEUKOCYTESUR NEGATIVE   Lipid Panel No results found for this basename: chol, trig, hdl, cholhdl, vldl, ldlcalc   HgbA1C  No results found for this basename: HGBA1C    Urine Drug Screen:   No results found for this basename: labopia, cocainscrnur, labbenz, amphetmu, thcu, labbarb    Alcohol Level:  Lab 06/11/12 0414  ETH <11    CT of the brain  06/11/2012    1.  No acute intracranial pathology seen on CT. 2.  Mild cortical volume loss.  MRI of the brain  06/11/2012  Acute non hemorrhagic small infarct posterior right temporal - parietal lobe junction.  Tiny acute non hemorrhagic infarct posterior right corona radiata.  Tiny acute infarct right parietal lobe.  Global atrophy.  C4-5 cervical spondylotic changes with spinal stenosis and mild cord flattening.  Moderate mucosal thickening left maxillary sinus.  MRA of the brain  06/11/2012  Abrupt cut off of flow to large segment of right middle cerebral artery branches just beyond the right middle cerebral artery bifurcation.  2D Echocardiogram  EF 15-20% with diffuse hypokinesis. Severe mitral regurg. no obvious source of embolus.    Carotid Doppler  No evidence of hemodynamically significant internal carotid artery stenosis. Vertebral artery flow is antegrade.   CXR  06/11/2012   Mild bilateral atelectasis noted; stable cardiomegaly.   EKG  atrial fibrillation, rate 82.   EEG 06/12/2012 This is a normal awake EEG.  Abd Xray 06/11/2012   1.  Unremarkable bowel gas pattern; no free intra-abdominal air seen. 2.  Mild left basilar airspace opacity likely reflects atelectasis.    Therapy Recommendations pending  Physical Exam    GENERAL EXAM: Patient is in no distress  CARDIOVASCULAR: IRREGULAR RHYTHM. No murmurs, no carotid bruits  NEUROLOGIC: MENTAL STATUS: awake, alert, language fluent, comprehension intact, naming intact (SPANISH LANGUAGE) CRANIAL NERVE: pupils equal and reactive to light, visual fields full to confrontation, extraocular muscles intact, no nystagmus, facial sensation and strength symmetric, uvula midline, shoulder shrug symmetric, tongue midline. MOTOR: normal bulk and tone, full strength in the BUE, BLE; MILD DRIFT AND WEAKNESS IN LUE AND LLE.  SENSORY: normal and symmetric to light touch COORDINATION: SLOW FFM IN LEFT HAND. REFLEXES: deep tendon reflexes present and symmetric GAIT/STATION: SITTING AT BEDSIDE.    ASSESSMENT [name] is a 78 y.o.right handed male presenting with new onset seizure. Imaging confirms multiple small infarcts:  posterior right temporal - parietal lobe junction, posterior right corona radiata and right parietal lobe.  Infarct felt to be embolic secondary to new onset atrial fibrillation.  Work up underway. On no antiplatelets prior to admission. Now on aspirin 325 mg orally every day for secondary stroke prevention. Patient with resultant subtle left arm drift (hemiparesis).  atrial fibrillation, new over night  New seizure at stroke onset, no indication for antiepileptics at this time Hypertension Hyperlipidemia, LDL pending, on statin PTA, goal LDL <  100 LBBB, BBBB CHF EF 15-20 %   Hospital day # 1  TREATMENT/PLAN  Recommend  warfarin or other anticoagulant for secondary stroke prevention. Do not bridge. If not a anticoagulant candidate, treat with aspirin 81mg  and plavix 75 mg daily.   Check Lipid paned and HgbA1c  Therapy evals  Dr. Marjory Lies discussed with patient, wife and Dr. Judd Gaudier, MSN, RN, ANVP-BC, ANP-BC, GNP-BC [facility] Pager: [phone] 06/12/2012 8:29 AM  I have personally obtained a history, examined the patient, evaluated imaging results, and formulated the assessment and plan of care. I agree with the above.   [facility] - Stroke Team Joycelyn Schmid, MD 06/12/2012 8:29 AM  Please refer to [email or web address] for on-call Stroke MD

## 2012-06-12 NOTE — Progress Notes (Signed)
Pt converted to Afib overnight and pt had a 9 beat run of vtach this AM.  Was with pt when happened, pt asymptomatic, and vitals stable.  Dr. Robb Matar text/paged.  Will continue to monitor.

## 2012-06-12 NOTE — Procedures (Signed)
EEG NUMBER:  REFERRING PHYSICIAN:  Marinda Elk, M.D.  HISTORY:  A 78 year old male with new onset seizure.  MEDICATIONS:  Coreg, Trilipix, Lanoxin, Lasix, Claritin, Zantac, Zocor, Aldactone.  CONDITIONS OF RECORDING:  This is a 16-channel EEG carried out with the patient in the awake state.  DESCRIPTION:  The waking background activity consists of a low-voltage, symmetrical, fairly well-organized 8.5 hertz alpha activity seen from the parieto-occipital and posterotemporal regions.  Low-voltage, fast activity, poorly organized is seen anteriorly, and at times, superimposed on more posterior rhythms.  A mixture of theta and alpha was seen from these central and temporal regions.  The patient does not drowse or sleep.  Hyperventilation was not performed.  Intermittent photic stimulation failed to elicit any change in the tracing.  IMPRESSION:  This is a normal awake EEG.  COMMENT:  An EEG with the patient sleep deprived to elicit drowse and light sleep may be desirable to further elicit possible seizure disorder.          ______________________________ Thana Farr, MD    AV:[id number] D:  06/11/2012 16:59:46  T:  06/12/2012 03:41:21  Job #:  [id number]

## 2012-06-22 ENCOUNTER — Emergency Department (HOSPITAL_COMMUNITY): Payer: Medicare Other

## 2012-06-22 ENCOUNTER — Encounter (HOSPITAL_COMMUNITY): Payer: Self-pay | Admitting: Vascular Surgery

## 2012-06-22 ENCOUNTER — Ambulatory Visit (INDEPENDENT_AMBULATORY_CARE_PROVIDER_SITE_OTHER): Payer: Medicare Other | Admitting: *Deleted

## 2012-06-22 ENCOUNTER — Inpatient Hospital Stay (HOSPITAL_COMMUNITY)
Admission: EM | Admit: 2012-06-22 | Discharge: 2012-07-10 | DRG: 286 | Disposition: A | Discharge status: Home / Self Care | Payer: Medicare Other | Attending: Internal Medicine | Admitting: Internal Medicine

## 2012-06-22 ENCOUNTER — Inpatient Hospital Stay (HOSPITAL_COMMUNITY): Payer: Medicare Other

## 2012-06-22 DIAGNOSIS — I6340 Cerebral infarction due to embolism of unspecified cerebral artery: Secondary | ICD-10-CM

## 2012-06-22 DIAGNOSIS — I2609 Other pulmonary embolism with acute cor pulmonale: Secondary | ICD-10-CM

## 2012-06-22 DIAGNOSIS — K560 Paralytic ileus: Secondary | ICD-10-CM | POA: Diagnosis present

## 2012-06-22 DIAGNOSIS — N179 Acute kidney failure, unspecified: Secondary | ICD-10-CM

## 2012-06-22 DIAGNOSIS — I428 Other cardiomyopathies: Secondary | ICD-10-CM

## 2012-06-22 DIAGNOSIS — Z79899 Other long term (current) drug therapy: Secondary | ICD-10-CM

## 2012-06-22 DIAGNOSIS — I10 Essential (primary) hypertension: Secondary | ICD-10-CM | POA: Diagnosis present

## 2012-06-22 DIAGNOSIS — I447 Left bundle-branch block, unspecified: Secondary | ICD-10-CM | POA: Diagnosis present

## 2012-06-22 DIAGNOSIS — R0602 Shortness of breath: Secondary | ICD-10-CM | POA: Diagnosis present

## 2012-06-22 DIAGNOSIS — I5023 Acute on chronic systolic (congestive) heart failure: Principal | ICD-10-CM

## 2012-06-22 DIAGNOSIS — Z7901 Long term (current) use of anticoagulants: Secondary | ICD-10-CM

## 2012-06-22 DIAGNOSIS — Z8673 Personal history of transient ischemic attack (TIA), and cerebral infarction without residual deficits: Secondary | ICD-10-CM

## 2012-06-22 DIAGNOSIS — I5022 Chronic systolic (congestive) heart failure: Secondary | ICD-10-CM | POA: Diagnosis present

## 2012-06-22 DIAGNOSIS — R570 Cardiogenic shock: Secondary | ICD-10-CM | POA: Diagnosis not present

## 2012-06-22 DIAGNOSIS — I4891 Unspecified atrial fibrillation: Secondary | ICD-10-CM | POA: Diagnosis present

## 2012-06-22 DIAGNOSIS — I509 Heart failure, unspecified: Secondary | ICD-10-CM | POA: Diagnosis present

## 2012-06-22 DIAGNOSIS — E785 Hyperlipidemia, unspecified: Secondary | ICD-10-CM

## 2012-06-22 DIAGNOSIS — R569 Unspecified convulsions: Secondary | ICD-10-CM | POA: Diagnosis present

## 2012-06-22 DIAGNOSIS — I639 Cerebral infarction, unspecified: Secondary | ICD-10-CM | POA: Diagnosis present

## 2012-06-22 DIAGNOSIS — R079 Chest pain, unspecified: Secondary | ICD-10-CM

## 2012-06-22 DIAGNOSIS — Z8249 Family history of ischemic heart disease and other diseases of the circulatory system: Secondary | ICD-10-CM

## 2012-06-22 DIAGNOSIS — E876 Hypokalemia: Secondary | ICD-10-CM | POA: Diagnosis not present

## 2012-06-22 DIAGNOSIS — I2721 Secondary pulmonary arterial hypertension: Secondary | ICD-10-CM

## 2012-06-22 LAB — COMPREHENSIVE METABOLIC PANEL
ALT: 42 U/L (ref 0–53)
AST: 29 U/L (ref 0–37)
Albumin: 3.4 g/dL — ABNORMAL LOW (ref 3.5–5.2)
Calcium: 8.9 mg/dL (ref 8.4–10.5)
Chloride: 98 mEq/L (ref 96–112)
Creatinine, Ser: 1.31 mg/dL (ref 0.50–1.35)
Sodium: 140 mEq/L (ref 135–145)

## 2012-06-22 LAB — CBC
MCH: 31.4 pg (ref 26.0–34.0)
MCV: 93.4 fL (ref 78.0–100.0)
Platelets: 244 10*3/uL (ref 150–400)
RDW: 14.4 % (ref 11.5–15.5)
WBC: 10.2 10*3/uL (ref 4.0–10.5)

## 2012-06-22 LAB — POCT I-STAT TROPONIN I

## 2012-06-22 LAB — APTT: aPTT: 39 seconds — ABNORMAL HIGH (ref 24–37)

## 2012-06-22 LAB — PROTIME-INR: INR: 2.86 — ABNORMAL HIGH (ref 0.00–1.49)

## 2012-06-22 LAB — DIGOXIN LEVEL: Digoxin Level: 1.2 ng/mL (ref 0.8–2.0)

## 2012-06-22 MED ORDER — ONDANSETRON HCL 4 MG/2ML IJ SOLN: 4.0000 mg | Freq: Four times a day (QID) | INTRAMUSCULAR | Status: DC | PRN

## 2012-06-22 MED ORDER — NITROGLYCERIN 0.4 MG SL SUBL
0.4000 mg | SUBLINGUAL_TABLET | SUBLINGUAL | Status: DC | PRN
  Administered 2012-06-22 (×2): 0.4 mg via SUBLINGUAL
  Filled 2012-06-22: qty 25

## 2012-06-22 MED ORDER — WARFARIN - PHARMACIST DOSING INPATIENT
Freq: Every day | Status: DC
  Administered 2012-06-26: 18:00:00

## 2012-06-22 MED ORDER — SODIUM CHLORIDE 0.9 % IJ SOLN
3.0000 mL | Freq: Two times a day (BID) | INTRAMUSCULAR | Status: DC
  Administered 2012-06-22 – 2012-07-01 (×16): 3 mL via INTRAVENOUS

## 2012-06-22 MED ORDER — LISINOPRIL 2.5 MG PO TABS
2.5000 mg | ORAL_TABLET | Freq: Every day | ORAL | Status: DC
  Administered 2012-06-22: 2.5 mg via ORAL
  Filled 2012-06-22 (×2): qty 1

## 2012-06-22 MED ORDER — SODIUM CHLORIDE 0.9 % IJ SOLN: 3.0000 mL | INTRAMUSCULAR | Status: DC | PRN

## 2012-06-22 MED ORDER — NITROGLYCERIN 2 % TD OINT
0.5000 [in_us] | TOPICAL_OINTMENT | Freq: Four times a day (QID) | TRANSDERMAL | Status: DC
  Administered 2012-06-22 – 2012-06-29 (×26): 0.5 [in_us] via TOPICAL
  Filled 2012-06-22: qty 30
  Filled 2012-06-22: qty 1

## 2012-06-22 MED ORDER — CARVEDILOL 6.25 MG PO TABS
6.2500 mg | ORAL_TABLET | Freq: Two times a day (BID) | ORAL | Status: DC
  Administered 2012-06-23 – 2012-06-27 (×9): 6.25 mg via ORAL
  Filled 2012-06-22 (×11): qty 1

## 2012-06-22 MED ORDER — FUROSEMIDE 10 MG/ML IJ SOLN
40.0000 mg | Freq: Two times a day (BID) | INTRAMUSCULAR | Status: DC
  Administered 2012-06-22: 40 mg via INTRAVENOUS
  Filled 2012-06-22 (×2): qty 4

## 2012-06-22 MED ORDER — FUROSEMIDE 10 MG/ML IJ SOLN
40.0000 mg | Freq: Once | INTRAMUSCULAR | Status: AC
  Administered 2012-06-22: 40 mg via INTRAVENOUS
  Filled 2012-06-22: qty 4

## 2012-06-22 MED ORDER — SODIUM CHLORIDE 0.9 % IV SOLN
INTRAVENOUS | Status: DC
  Administered 2012-06-22: 1000 mL via INTRAVENOUS

## 2012-06-22 MED ORDER — FAMOTIDINE 40 MG PO TABS
40.0000 mg | ORAL_TABLET | Freq: Every day | ORAL | Status: DC
  Administered 2012-06-23 – 2012-07-10 (×18): 40 mg via ORAL
  Filled 2012-06-22 (×19): qty 1

## 2012-06-22 MED ORDER — DIGOXIN 0.0625 MG HALF TABLET
0.0625 mg | ORAL_TABLET | Freq: Every day | ORAL | Status: DC
  Administered 2012-06-22 – 2012-07-10 (×18): 0.0625 mg via ORAL
  Filled 2012-06-22 (×20): qty 1

## 2012-06-22 MED ORDER — LORATADINE 10 MG PO TABS
10.0000 mg | ORAL_TABLET | Freq: Every day | ORAL | Status: DC
  Administered 2012-06-23 – 2012-07-10 (×18): 10 mg via ORAL
  Filled 2012-06-22 (×18): qty 1

## 2012-06-22 MED ORDER — FENOFIBRATE 54 MG PO TABS
54.0000 mg | ORAL_TABLET | Freq: Every day | ORAL | Status: DC
  Administered 2012-06-23 – 2012-07-08 (×16): 54 mg via ORAL
  Filled 2012-06-22 (×17): qty 1

## 2012-06-22 MED ORDER — SODIUM CHLORIDE 0.9 % IV SOLN
250.0000 mL | INTRAVENOUS | Status: DC | PRN
  Administered 2012-06-25: 250 mL via INTRAVENOUS

## 2012-06-22 MED ORDER — WARFARIN SODIUM 4 MG PO TABS
4.0000 mg | ORAL_TABLET | Freq: Once | ORAL | Status: AC
  Administered 2012-06-22: 4 mg via ORAL
  Filled 2012-06-22: qty 1

## 2012-06-22 MED ORDER — ACETAMINOPHEN 325 MG PO TABS
650.0000 mg | ORAL_TABLET | ORAL | Status: DC | PRN
  Administered 2012-06-22 – 2012-06-27 (×2): 650 mg via ORAL
  Filled 2012-06-22 (×2): qty 2

## 2012-06-22 MED ORDER — SPIRONOLACTONE 25 MG PO TABS
25.0000 mg | ORAL_TABLET | Freq: Every day | ORAL | Status: DC
  Administered 2012-06-23 – 2012-06-30 (×8): 25 mg via ORAL
  Filled 2012-06-22 (×8): qty 1

## 2012-06-22 MED ORDER — SIMVASTATIN 20 MG PO TABS
20.0000 mg | ORAL_TABLET | Freq: Every evening | ORAL | Status: DC
  Administered 2012-06-22 – 2012-07-10 (×18): 20 mg via ORAL
  Filled 2012-06-22 (×21): qty 1

## 2012-06-22 MED ORDER — BISACODYL 10 MG RE SUPP
10.0000 mg | Freq: Once | RECTAL | Status: DC
  Filled 2012-06-22 (×2): qty 1

## 2012-06-22 NOTE — ED Provider Notes (Signed)
Medical screening examination/treatment/procedure(s) were conducted as a shared visit with non-physician practitioner(s) and myself.  I personally evaluated the patient during the encounter  Ethelda Chick, MD 06/22/12 1539

## 2012-06-22 NOTE — ED Provider Notes (Signed)
History     CSN: [id number]  Arrival date & time 06/22/12  1228   First MD Initiated Contact with Patient 06/22/12 1306      Chief Complaint  Patient presents with  . Chest Pain    (Consider location/radiation/quality/duration/timing/severity/associated sxs/prior treatment) Patient is a 79 y.o. male presenting with chest pain. The history is provided by the patient.  Chest Pain The chest pain began 3 - 5 days ago. Primary symptoms include shortness of breath and palpitations. Pertinent negatives for primary symptoms include no fever, no cough, no abdominal pain and no nausea.  The palpitations also occurred with shortness of breath.  Associated symptoms comments: Increasing SOB with less and less activity, associated with chest pain. No cough or fever. He reports his legs have been swelling more and more as well. He states he was hospitalized recently for same. .  Pertinent negatives for past medical history include no seizures.     Past Medical History  Diagnosis Date  . HTN (hypertension)   . High cholesterol   . CHF (congestive heart failure)   . BBBB (bilateral bundle branch block)   . LBBB (left bundle branch block)   . Weakness   . Tired   . Seizures 06/11/2012    new onset/notes (06/11/2012)  . SOB (shortness of breath)     "sometimes when I lay down;; related to not taking my RX" (06/11/2012)    Past Surgical History  Procedure Date  . Throat surgery 1942    "and nose" (06/11/2012); ?T&A  . Inguinal hernia repair ~ 2008    "both sides" (06/11/2012)    No family history on file.  History  Substance Use Topics  . Smoking status: Never Smoker   . Smokeless tobacco: Never Used  . Alcohol Use: No      Review of Systems  Constitutional: Negative for fever.  Respiratory: Positive for shortness of breath. Negative for cough.   Cardiovascular: Positive for chest pain, palpitations and leg swelling.  Gastrointestinal: Negative for nausea and abdominal pain.    Musculoskeletal: Negative for myalgias.  Skin: Negative for rash.  Neurological: Negative for seizures.  Psychiatric/Behavioral: Negative for confusion.    Allergies  Review of patient's allergies indicates no known allergies.  Home Medications   Current Outpatient Rx  Name  Route  Sig  Dispense  Refill  . CARVEDILOL 6.25 MG PO TABS   Oral   Take 6.25 mg by mouth 2 (two) times daily with a meal.         . CHOLINE FENOFIBRATE 45 MG PO CPDR   Oral   Take 45 mg by mouth daily.         Marland Kitchen DIGOXIN 0.125 MG PO TABS   Oral   Take 0.125 mg by mouth daily.         . FUROSEMIDE 40 MG PO TABS   Oral   Take 40 mg by mouth 2 (two) times daily.         Marland Kitchen HYDROCODONE-ACETAMINOPHEN 5-325 MG PO TABS   Oral   Take 1 tablet by mouth every 6 (six) hours as needed for pain.   30 tablet   0   . LORATADINE 10 MG PO TABS   Oral   Take 10 mg by mouth daily.         Marland Kitchen RANITIDINE HCL 300 MG PO TABS   Oral   Take 300 mg by mouth daily.         Marland Kitchen SIMVASTATIN  20 MG PO TABS   Oral   Take 20 mg by mouth every evening.         Marland Kitchen SPIRONOLACTONE 25 MG PO TABS   Oral   Take 25 mg by mouth daily.         . WARFARIN SODIUM 4 MG PO TABS   Oral   Take 1 tablet (4 mg total) by mouth daily.   30 tablet   0     BP 119/70  Pulse 59  Temp 97.6 F (36.4 C) (Oral)  Resp 23  SpO2 93%  Physical Exam  Constitutional: He is oriented to person, place, and time. He appears well-developed and well-nourished. No distress.  Neck: Normal range of motion.  Cardiovascular: Normal rate.  An irregularly irregular rhythm present.  Pulmonary/Chest: Effort normal.       Breath sounds diminished at bases.  Abdominal: Soft. There is no tenderness.  Musculoskeletal: Normal range of motion. He exhibits edema.  Neurological: He is alert and oriented to person, place, and time.  Skin: Skin is warm and dry.  Psychiatric: He has a normal mood and affect.    ED Course  Procedures (including  critical care time)  Labs Reviewed  PRO B NATRIURETIC PEPTIDE - Abnormal; Notable for the following:    Pro B Natriuretic peptide (BNP) 32268.0 (*)     All other components within normal limits  COMPREHENSIVE METABOLIC PANEL - Abnormal; Notable for the following:    Glucose, Bld 112 (*)     BUN 31 (*)     Albumin 3.4 (*)     Alkaline Phosphatase 174 (*)     GFR calc non Af Amer 51 (*)     GFR calc Af Amer 59 (*)     All other components within normal limits  PROTIME-INR - Abnormal; Notable for the following:    Prothrombin Time 28.5 (*)     INR 2.86 (*)     All other components within normal limits  APTT - Abnormal; Notable for the following:    aPTT 39 (*)     All other components within normal limits  CBC  POCT I-STAT TROPONIN I  DIGOXIN LEVEL   Dg Chest 2 View  06/22/2012  *RADIOLOGY REPORT*  Clinical Data: Chest pain  CHEST - 2 VIEW  Comparison: 06/11/2012  Findings: Cardiomegaly again noted.  No acute infiltrate or pulmonary edema.  Mild elevation of the right hemidiaphragm. Stable mild degenerative changes thoracic spine.  IMPRESSION: No active disease.  Cardiomegaly again noted.   Original Report Authenticated By: Natasha Mead, M.D.      No diagnosis found.  1. chf 2. Atrial fibrillation  MDM  Records reviewed. Recent hospitalization for Seizures and a history of A-fib and CHF. Current symptoms c/w CHF exacerbation in stable patient. Will admit.        Arnoldo Hooker, PA-C 06/22/12 1538

## 2012-06-22 NOTE — H&P (Signed)
[facility]                                                                                    Patient Demographics  Tyler Christensen, is a 79 y.o. male  CSN: [id number]  MRN: [id number]  DOB - Sep 29, 1932  Admit Date - 06/22/2012  Outpatient Primary MD for the patient is Jearld Lesch, MD   With History of -  Past Medical History  Diagnosis Date  . HTN (hypertension)   . High cholesterol   . CHF (congestive heart failure)   . BBBB (bilateral bundle branch block)   . LBBB (left bundle branch block)   . Weakness   . Tired   . Seizures 06/11/2012    new onset/notes (06/11/2012)  . SOB (shortness of breath)     "sometimes when I lay down;; related to not taking my RX" (06/11/2012)      Past Surgical History  Procedure Date  . Throat surgery 1942    "and nose" (06/11/2012); ?T&A  . Inguinal hernia repair ~ 2008    "both sides" (06/11/2012)    in for   Chief Complaint  Patient presents with  . Chest Pain     HPI  Tyler Christensen  is a 79 y.o. male, with history of chronic systolic CHF EF recently checked about a month ago 20%, hypertension, dyslipidemia, strong family history of CAD, recent CVA requiring Coumadin, recent seizure due to CVA, was recently hospitalized and discharged home comes back to the hospital with 3 to four-day history of orthopnea and gradually progressive shortness of breath along with left-sided chest tightness which at times radiates to his neck, no fever mild dry cough, no body aches no exposure to sick contacts, presented to the ER where his workup was consistent with acute on chronic systolic CHF EKG showed left bundle branch block first , INR was therapeutic and I was called to admit the patient.     Review of Systems  review of systems is limited due to language barrier all review of systems was obtained through interpreter over the phone  In addition to the HPI above,  No Fever-chills, No Headache, No changes with Vision or  hearing, No problems swallowing food or Liquids, I'll left-sided chest Chest pain times radiating to his neck which is getting better since he has been in the hospital, mild dry cough and positive orthopnea and Shortness of Breath, No Abdominal pain, No Nausea or Vommitting, Bowel movements are regular, No Blood in stool or Urine, No dysuria, No new skin rashes or bruises, No new joints pains-aches,  No new weakness, tingling, numbness in any extremity, No recent weight gain or loss, No polyuria, polydypsia or polyphagia, No significant Mental Stressors.  A full 10 point Review of Systems was done, except as stated above, all other Review of Systems were negative.   Social History History  Substance Use Topics  . Smoking status: Never Smoker   . Smokeless tobacco: Never Used  . Alcohol Use: No     Family History Strong family history of coronary artery disease in his siblings  Prior to Admission medications   Medication Sig Start Date  End Date Taking? Authorizing Provider  carvedilol (COREG) 6.25 MG tablet Take 6.25 mg by mouth 2 (two) times daily with a meal.   Yes Historical Provider, MD  Choline Fenofibrate (TRILIPIX) 45 MG capsule Take 45 mg by mouth daily.   Yes Historical Provider, MD  digoxin (LANOXIN) 0.125 MG tablet Take 0.125 mg by mouth daily.   Yes Historical Provider, MD  furosemide (LASIX) 40 MG tablet Take 40 mg by mouth 2 (two) times daily.   Yes Historical Provider, MD  HYDROcodone-acetaminophen (NORCO) 5-325 MG per tablet Take 1 tablet by mouth every 6 (six) hours as needed for pain. 06/12/12  Yes Marinda Elk, MD  loratadine (CLARITIN) 10 MG tablet Take 10 mg by mouth daily.   Yes Historical Provider, MD  ranitidine (ZANTAC) 300 MG tablet Take 300 mg by mouth daily.   Yes Historical Provider, MD  simvastatin (ZOCOR) 20 MG tablet Take 20 mg by mouth every evening.   Yes Historical Provider, MD  spironolactone (ALDACTONE) 25 MG tablet Take 25 mg by mouth  daily.   Yes Historical Provider, MD  warfarin (COUMADIN) 4 MG tablet Take 1 tablet (4 mg total) by mouth daily. 06/12/12  Yes Marinda Elk, MD    No Known Allergies  Physical Exam  Vitals  Blood pressure 122/86, pulse 45, temperature 97.6 F (36.4 C), temperature source Oral, resp. rate 24, SpO2 96.00%.   1. General elderly Hispanic male lying in bed in NAD,   2. Normal affect and insight, Not Suicidal or Homicidal, Awake Alert, Oriented X 3.  3. No F.N deficits, ALL C.Nerves Intact, Strength 5/5 all 4 extremities, Sensation intact all 4 extremities, Plantars down going.  4. Ears and Eyes appear Normal, Conjunctivae clear, PERRLA. Moist Oral Mucosa.  5. Supple Neck, No JVD, No cervical lymphadenopathy appriciated, No Carotid Bruits.  6. Symmetrical Chest wall movement, Good air movement bilaterally, bilateral rales  7. RRR, No Gallops, Rubs or Murmurs, No Parasternal Heave.  8. Positive Bowel Sounds, Abdomen Soft, Non tender, No organomegaly appriciated,No rebound -guarding or rigidity.  9.  No Cyanosis, Normal Skin Turgor, No Skin Rash or Bruise. 2+ leg edema  10. Good muscle tone,  joints appear normal , no effusions, Normal ROM.  11. No Palpable Lymph Nodes in Neck or Axillae    Data Review  CBC  Lab 06/22/12 1434  WBC 10.2  HGB 15.3  HCT 45.5  PLT 244  MCV 93.4  MCH 31.4  MCHC 33.6  RDW 14.4  LYMPHSABS --  MONOABS --  EOSABS --  BASOSABS --  BANDABS --   ------------------------------------------------------------------------------------------------------------------  Chemistries   Lab 06/22/12 1434  NA 140  K 3.7  CL 98  CO2 29  GLUCOSE 112*  BUN 31*  CREATININE 1.31  CALCIUM 8.9  MG --  AST 29  ALT 42  ALKPHOS 174*  BILITOT 0.8   ------------------------------------------------------------------------------------------------------------------ CrCl is unknown because there is no height on file for the current  visit. ------------------------------------------------------------------------------------------------------------------ No results found for this basename: TSH,T4TOTAL,FREET3,T3FREE,THYROIDAB in the last 72 hours   Coagulation profile  Lab 06/22/12 1434  INR 2.86*  PROTIME --   ------------------------------------------------------------------------------------------------------------------- No results found for this basename: DDIMER:2 in the last 72 hours -------------------------------------------------------------------------------------------------------------------  Cardiac Enzymes No results found for this basename: CK:3,CKMB:3,TROPONINI:3,MYOGLOBIN:3 in the last 168 hours ------------------------------------------------------------------------------------------------------------------ No components found with this basename: POCBNP:3   ---------------------------------------------------------------------------------------------------------------  Urinalysis    Component Value Date/Time   COLORURINE YELLOW 06/11/2012 0612   APPEARANCEUR CLEAR 06/11/2012 0612  LABSPEC 1.010 06/11/2012 0612   PHURINE 5.5 06/11/2012 0612   GLUCOSEU NEGATIVE 06/11/2012 0612   HGBUR NEGATIVE 06/11/2012 0612   BILIRUBINUR NEGATIVE 06/11/2012 0612   KETONESUR NEGATIVE 06/11/2012 0612   PROTEINUR NEGATIVE 06/11/2012 0612   UROBILINOGEN 0.2 06/11/2012 0612   NITRITE NEGATIVE 06/11/2012 0612   LEUKOCYTESUR NEGATIVE 06/11/2012 0612    ----------------------------------------------------------------------------------------------------------------  Imaging results:   Dg Chest 2 View  06/22/2012  *RADIOLOGY REPORT*  Clinical Data: Chest pain  CHEST - 2 VIEW  Comparison: 06/11/2012  Findings: Cardiomegaly again noted.  No acute infiltrate or pulmonary edema.  Mild elevation of the right hemidiaphragm. Stable mild degenerative changes thoracic spine.  IMPRESSION: No active disease.  Cardiomegaly  again noted.   Original Report Authenticated By: Natasha Mead, M.D.    Ct Head Wo Contrast  06/11/2012  *RADIOLOGY REPORT*  Clinical Data: Shaking; gasping for air.  Somewhat disoriented.  CT HEAD WITHOUT CONTRAST  Technique:  Contiguous axial images were obtained from the base of the skull through the vertex without contrast.  Comparison: None.  Findings: There is no evidence of acute infarction, mass lesion, or intra- or extra-axial hemorrhage on CT.  Prominence of the ventricles and sulci reflects mild cortical volume loss.  Mild cerebellar atrophy is noted.  The brainstem and fourth ventricle are within normal limits.  The basal ganglia are unremarkable in appearance.  The cerebral hemispheres demonstrate grossly normal gray-white differentiation. No mass effect or midline shift is seen.  There is no evidence of fracture; visualized osseous structures are unremarkable in appearance.  The visualized portions of the orbits are within normal limits.  Mucosal thickening is noted within the left maxillary sinus; the remaining paranasal sinuses and mastoid air cells are well-aerated.  No significant soft tissue abnormalities are seen.  IMPRESSION:  1.  No acute intracranial pathology seen on CT. 2.  Mild cortical volume loss.   Original Report Authenticated By: Tonia Ghent, M.D.    Mr [facility] Wo Contrast  06/11/2012  *RADIOLOGY REPORT*  Clinical Data:  New onset seizures.  Hypertension and high cholesterol.  MRI BRAIN WITHOUT CONTRAST MRA HEAD WITHOUT CONTRAST  Technique: Multiplanar, multiecho pulse sequences of the brain and surrounding structures were obtained according to standard protocol without intravenous contrast.  Angiographic images of the head were obtained using MRA technique without contrast.  Comparison: 06/11/2012 head CT.  No comparison brain MR.  MRI HEAD  Findings:  Acute non hemorrhagic small infarct posterior right temporal - parietal lobe junction.  Tiny acute non hemorrhagic infarct  posterior right corona radiata.  Tiny acute infarct right parietal lobe.  Abnormal flow within the right middle cerebral artery branches. Please see below.  Global atrophy without hydrocephalus.  No intracranial hemorrhage.  No intracranial mass lesion detected on this unenhanced exam.  Moderate mucosal thickening left maxillary sinus.  Cervical spondylotic changes with spinal stenosis C4-5 with mild cord flattening.  IMPRESSION: Acute non hemorrhagic small infarct posterior right temporal - parietal lobe junction.  Tiny acute non hemorrhagic infarct posterior right corona radiata.  Tiny acute infarct right parietal lobe.  Global atrophy.  C4-5 cervical spondylotic changes with spinal stenosis and mild cord flattening.  Moderate mucosal thickening left maxillary sinus.  MRA HEAD  Findings: Abrupt cut off of flow to large segment of right posterior cerebral artery branches just beyond the right middle cerebral artery bifurcation.  No significant stenosis proximal to this level.  Right vertebral artery is dominant.  No high-grade stenosis of the vertebral arteries  or basilar artery.  Mild narrowing proximal left PICA.  Poor delineation left AICA.  Slight irregularity superior cerebellar arteries bilaterally.  Mild irregularity posterior cerebral artery branch vessels with decreased visualization left posterior cerebral artery distal branches.  No aneurysm or vascular malformation noted.  IMPRESSION: Abrupt cut off of flow to large segment of right posterior cerebral artery branches just beyond the right middle cerebral artery bifurcation.  Please see above.  This has been made a PRA call report utilizing dashboard call feature.   Original Report Authenticated By: Lacy Duverney, M.D.    Mr Brain Wo Contrast  06/11/2012  *RADIOLOGY REPORT*  Clinical Data:  New onset seizures.  Hypertension and high cholesterol.  MRI BRAIN WITHOUT CONTRAST MRA HEAD WITHOUT CONTRAST  Technique: Multiplanar, multiecho pulse sequences of  the brain and surrounding structures were obtained according to standard protocol without intravenous contrast.  Angiographic images of the head were obtained using MRA technique without contrast.  Comparison: 06/11/2012 head CT.  No comparison brain MR.  MRI HEAD  Findings:  Acute non hemorrhagic small infarct posterior right temporal - parietal lobe junction.  Tiny acute non hemorrhagic infarct posterior right corona radiata.  Tiny acute infarct right parietal lobe.  Abnormal flow within the right middle cerebral artery branches. Please see below.  Global atrophy without hydrocephalus.  No intracranial hemorrhage.  No intracranial mass lesion detected on this unenhanced exam.  Moderate mucosal thickening left maxillary sinus.  Cervical spondylotic changes with spinal stenosis C4-5 with mild cord flattening.  IMPRESSION: Acute non hemorrhagic small infarct posterior right temporal - parietal lobe junction.  Tiny acute non hemorrhagic infarct posterior right corona radiata.  Tiny acute infarct right parietal lobe.  Global atrophy.  C4-5 cervical spondylotic changes with spinal stenosis and mild cord flattening.  Moderate mucosal thickening left maxillary sinus.  MRA HEAD  Findings: Abrupt cut off of flow to large segment of right posterior cerebral artery branches just beyond the right middle cerebral artery bifurcation.  No significant stenosis proximal to this level.  Right vertebral artery is dominant.  No high-grade stenosis of the vertebral arteries or basilar artery.  Mild narrowing proximal left PICA.  Poor delineation left AICA.  Slight irregularity superior cerebellar arteries bilaterally.  Mild irregularity posterior cerebral artery branch vessels with decreased visualization left posterior cerebral artery distal branches.  No aneurysm or vascular malformation noted.  IMPRESSION: Abrupt cut off of flow to large segment of right posterior cerebral artery branches just beyond the right middle cerebral artery  bifurcation.  Please see above.  This has been made a PRA call report utilizing dashboard call feature.   Original Report Authenticated By: Lacy Duverney, M.D.    Dg Chest Port 1 View  06/11/2012  *RADIOLOGY REPORT*  Clinical Data: Chest pain and shortness of breath; shaking.  PORTABLE CHEST - 1 VIEW  Comparison: Chest radiograph performed 04/04/2012  Findings: The lungs are well-aerated.  Mild bilateral atelectasis is noted.  There is no evidence of pleural effusion or pneumothorax.  The cardiomediastinal silhouette is enlarged; calcification is noted within the aortic arch.  No acute osseous abnormalities are seen.  IMPRESSION: Mild bilateral atelectasis noted; stable cardiomegaly.   Original Report Authenticated By: Tonia Ghent, M.D.    Dg Abd 2 Views  06/11/2012  *RADIOLOGY REPORT*  Clinical Data: Nausea, vomiting, abdominal pain and distension.  ABDOMEN - 2 VIEW  Comparison: None.  Findings: The visualized bowel gas pattern is unremarkable. Scattered air and stool filled loops of colon are seen; no  abnormal dilatation of small bowel loops is seen to suggest small bowel obstruction.  No free intra-abdominal air is identified, though evaluation for free air is limited on a single supine view.  The visualized osseous structures are within normal limits; the sacroiliac joints are unremarkable in appearance.  Mild left basilar opacity likely reflects atelectasis.  IMPRESSION:  1.  Unremarkable bowel gas pattern; no free intra-abdominal air seen. 2.  Mild left basilar airspace opacity likely reflects atelectasis.   Original Report Authenticated By: Tonia Ghent, M.D.     My personal review of EKG: Rhythm NSR, with old left bundle branch block   Assessment & Plan   1. Shortness of breath and orthopnea along with mild left-sided chest tightness in a patient with history of chronic systolic CHF with EF 20% in December of 2013. Presenting with 2+ leg edema, elevated proBNP. All due to acute on chronic  systolic CHF, patient will be admitted on a telemetry bed, will cycle troponins, apply nitro paste, change to IV Lasix, fluid restriction, elevate head of the bed 35-40,  We'll add lo monitor BMP closely,  Add low-dose ACE inhibitor, have consulted [facility] to see the patient. Coumadin will be dosed by pharmacy to keep INR therapeutic.    2. Paroxysmal atrial fibrillation and left bundle branch block.- No acute issues: Goal will be rate control, Coreg will be continued, pharmacy will monitor Coumadin.    3. 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 No acute issues no new focal logical deficits, continue Coumadin as above.     DVT Prophylaxis : Coumadin and SCDs  AM Labs Ordered, also please review Full Orders  Family Communication: Admission, patients condition and plan of care including tests being ordered have been discussed with the patient who indicates understanding and agree with the plan and Code Status.  Code Status full  Disposition Plan: home  Time spent in minutes : 35  Condition GUARDED  Leroy Sea M.D on 06/22/2012 at 4:56 PM  Between 7am to 7pm - Pager - [phone]  After 7pm go to [email or web address] - password [email or web address]  And look for the night coverage person covering me after hours  [facility] Office  [phone]

## 2012-06-22 NOTE — Progress Notes (Signed)
Pt walked in c/o active cp and SOB, triage was called. Pt has not been seen here prior, he is to establish later in the month with Dr Eden Emms. Pt was taken to exam room by w/c. Pt does not speak english, he has a friend with him that is able to translate but very minimally, pt from [place]. Cp with sob x2 days with worsening symptoms, abdomen distended and hard, ankles 2+ pitting edema left worse than right. bp 100/64 p 98 ekg done afib w / lbbb. Pt unable to give pain number but points to chest and said it hurts little. No english is spoken.  EMS was called, Dr Jens Som reviewed ekg/ Rosann Auerbach and ED was contacted and informed of need for a translator.

## 2012-06-22 NOTE — Progress Notes (Signed)
Pt arrived from ED placed in room 4735. Telemetry monitor placed VS done and oriented to room. Pt is from [place] and speaks little [place].

## 2012-06-22 NOTE — ED Notes (Signed)
Pt reports to the ED via [facility] for eval of CP, atrial fibrillation, and SOB with exertion. PT comes from [facility]. Pt was recently hospitalized for cardiac issues. Per EMS 12 lead shows a. Fib at a rate of 70s -80s. Pt placed on 2L O2. Hx has a. Fib and CHF.

## 2012-06-22 NOTE — Progress Notes (Signed)
ANTICOAGULATION CONSULT NOTE - Initial Consult  Pharmacy Consult for Warfarin Indication: atrial fibrillation  No Known Allergies  Patient Measurements:   Heparin Dosing Weight:   Vital Signs: Temp: 97.6 F (36.4 C) (01/03 1249) Temp src: Oral (01/03 1249) BP: 122/86 mmHg (01/03 1645) Pulse Rate: 45  (01/03 1645)  Labs:  Basename 06/22/12 1434  HGB 15.3  HCT 45.5  PLT 244  APTT 39*  LABPROT 28.5*  INR 2.86*  HEPARINUNFRC --  CREATININE 1.31  CKTOTAL --  CKMB --  TROPONINI --    CrCl is unknown because there is no height on file for the current visit.   Medical History: Past Medical History  Diagnosis Date  . HTN (hypertension)   . High cholesterol   . CHF (congestive heart failure)   . BBBB (bilateral bundle branch block)   . LBBB (left bundle branch block)   . Weakness   . Tired   . Seizures 06/11/2012    new onset/notes (06/11/2012)  . SOB (shortness of breath)     "sometimes when I lay down;; related to not taking my RX" (06/11/2012)    Medications:   (Not in a hospital admission)  Admit Complaint: 79 y.o.  male  admitted 06/22/2012 with CP, SOB, after recent hospitalization.  Pharmacy consulted to dose warfarin.  Warfarin PTA dose 4 mg daily  Assessment: Anticoagulation: Afib. INR at goal.  Continue home dose  Cardiovascular: HTN, CHF (EF 20%), hyperlipidemia, BBB, CVA:  Spiro, lasix, simva, gig, coreg , HR low, BP at goal.  Follow up Scr and ACEI, dig level 1.2  Neurology/MSK: Siezures, no therapy PTA  Nephrology/Urology/Electrolytes: SCr 1.31, K 3.7, follow UOP  PTA Medication Issues: All Home Meds  Ordered:   Best Practices: DVT Prophylaxis:  INR at goal  Goal of Therapy:  INR 2-3 Monitor platelets by anticoagulation protocol: Yes   Plan:  1. Warfarin 4 mg daily. 2. Consider reducing digoxin dose given level is 1.2  Thank you for allowing pharmacy to be a part of this patients care team.  Lovenia Kim Pharm.D., BCPS Clinical  Pharmacist 06/22/2012 5:06 PM Pager: [phone] Phone: [phone]

## 2012-06-22 NOTE — Plan of Care (Signed)
Problem: Phase I Progression Outcomes Goal: EF % per last Echo/documented,Core Reminder form on chart Outcome: Completed/Met Date Met:  06/22/12 EF 20-25% from 06/11/13

## 2012-06-22 NOTE — Consult Note (Signed)
CARDIOLOGY CONSULT NOTE  Patient ID: Tyler Christensen, MRN: [id number], DOB/AGE: 02/13/1933 79 y.o. Admit date: 06/22/2012 Date of Consult: 06/22/2012  Primary Physician: Jearld Lesch, MD Primary Cardiologist: Dr. Eden Emms (scheduled to see as new eval 1/14)  Chief Complaint: shortness of breath and chest pain Reason for Consultation: CHF  HPI: 79 y.o. Tyler Christensen male w/ PMHx significant for Chronic Systolic CHF (EF 40%), NICM (normal coronary anatomy 2005), LBBB, PAF (on coumadin), HTN, HLD, CVA, and Seizure (2/2 CVA 05/2012) who presented to [facility] on 06/22/2012 with complaints of shortness of breath and chest pain.  Cath 2005 showed normal coronary arteries, EF 15%. Echo 2005 showed EF 10-20%. He was hospitalized from 12/23-12/14/13 with a seizure secondary to CVA in the setting of atrial fibrillation. He was placed on coumadin for secondary stroke prevention. Echo during this hospitalization showed EF 15-20%, diffuse hypokinesis, severe MR, mod-severe LAE, mod dilation RV, mod TR/PR, PASP .  He was discharged on coreg, digoxin, lasix, aldactone, zocor, and warfarin with plans to follow up with Dr. Eden Emms on 07/03/12 as well as in the advanced heart failure clinic and EP for consideration of ICD. ACEI/ARB was not initiated due to low BP.   He is spanish speaking only so the interview was completed using interpreter telephone. Since discharge he notes progressive LE swelling, shortness of breath and left sided chest pain. He reports medication compliance. Denies seizure activity or syncope. No focal neuro deficits. He also notes no bowel movement for 5 days. He presented to the [facility] clinic today with shortness of breath and chest pain for which an EKG was performed showing a.fib 95bpm w/ LBBB (unchanged from prior EKG). He was transported to the [facility] ED for further evaluation and treatment.   In the ED, CXR was without acute cardiopulmonary findings. Labs were significant for  normal poc troponin, pBNP 32,268 (previously 15,163), INR 2.86, dig level 1.2, BUN/Crt 31/1.31, alk phos 174, otherwise unremarkable CBC/CMET. He is being admitted by the medicine service for further evaluation and treatment. He has been placed on Lasix 40mg  IV bid, nitro paste, lisinopril 2.5mg  and continued on aldactone, digoxin, coreg, statin, and warfarin. He reports improvement in his breathing and chest pain with diuresis.    Past Medical History  Diagnosis Date  . HTN (hypertension)   . High cholesterol   . CHF (congestive heart failure)   . BBBB (bilateral bundle branch block)   . LBBB (left bundle branch block)   . Weakness   . Tired   . Seizures 06/11/2012    new onset/notes (06/11/2012)  . SOB (shortness of breath)     "sometimes when I lay down;; related to not taking my RX" (06/11/2012)     06/11/12 - Echo Study Conclusions: - Left ventricle: The cavity size was moderately dilated. Systolic function was severely reduced. The estimated ejection fraction was in the range of 15% to 20%. Diffuse hypokinesis. - Aortic valve: Trivial regurgitation. - Mitral valve: Severe regurgitation. - Left atrium: The atrium was moderately to severely dilated. - Right ventricle: The cavity size was moderately dilated. Systolic function was moderately reduced. - Right atrium: The atrium was mildly dilated. - Atrial septum: The septum bowed from left to right, consistent with increased left atrial pressure. - Tricuspid valve: Moderate regurgitation. - Pulmonic valve: Moderate regurgitation. - Pulmonary arteries: Systolic pressure was severely increased. PA peak pressure: 95mm Hg (S).  2005 - Cardiac Cath PRESSURES: Left ventricular pressure was 121/6, end-diastolic pressure 31, central aortic pressure  121/50 and mean of 80. Right atrial mean pressure 9, rapid ventricular pressure 40/15, end-diastolic pressure 25. Pulmonary capillary wedge mean pressure was 18. Thermodilution cardiac output was  4.3, thermodilution cardiac index was 4.3. Fick cardiac output was 4.0, Fick cardiac index was 2.2. There was no aortic valve gradient by pullback technique.  ANGIOGRAPHIC RESULTS: The patient's coronary arteries were widely patent throughout.  There was mild calcification in the proximal LAD and distal left main, but no significant lesions noted at either place. The LAD gave rise to 1 major diagonal branch, an intermediate ramus branch and a nondominant circumflex branch, all of which were normal except for some mild calcifications.  The right coronary artery was a large dominant vessel giving rise to a posterior descending branch and 2 posterolateral branches. No lesions were seen.  The left ventricular angiogram showed that there was global hypokinesis with an ejection fraction estimate of 15%. There was mild mitral regurgitation. There was no evidence of any LV thrombus. Peripheral arteriography was not performed.  FINAL DIAGNOSES:  1. Nonspecific cardiomyopathy.   1. Angiographic patent coronary arteries.   2. Left ventricular ejection fraction qualitatively 15%.   3. Mild mitral regurgitation.   4. Mild-to-moderate pulmonary hypertension with pulmonary artery pressures stable from catheterization in 2003.  2. Chronic congestive heart failure in a 79 year old who continues to work.  3. Left bundle branch block.  RECOMMENDATIONS: Considering the patient's symptomatology, pulmonary hypertension, left bundle branch block and left ventricular ejection fraction, the patient should continue on ACE inhibitor, beta blocker, digoxin, Lasix, aldosterone antagonist and should be considered for placement of a biventricular device for his congestive heart failure. 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   Surgical History:  Past Surgical History  Procedure Date  . Throat surgery 1942    "and nose" (06/11/2012); ?T&A  . Inguinal hernia repair ~ 2008    "both sides" (06/11/2012)      Home Meds: Medication Sig  carvedilol (COREG) 6.25 MG tablet Take 6.25 mg by mouth 2 (two) times daily with a meal.  Choline Fenofibrate (TRILIPIX) 45 MG capsule Take 45 mg by mouth daily.  digoxin (LANOXIN) 0.125 MG tablet Take 0.125 mg by mouth daily.  furosemide (LASIX) 40 MG tablet Take 40 mg by mouth 2 (two) times daily.  HYDROcodone-acetaminophen (NORCO) 5-325 MG per tablet Take 1 tablet by mouth every 6 (six) hours as needed for pain.  loratadine (CLARITIN) 10 MG tablet Take 10 mg by mouth daily.  ranitidine (ZANTAC) 300 MG tablet Take 300 mg by mouth daily.  simvastatin (ZOCOR) 20 MG tablet Take 20 mg by mouth every evening.  spironolactone (ALDACTONE) 25 MG tablet Take 25 mg by mouth daily.  warfarin (COUMADIN) 4 MG tablet Take 1 tablet (4 mg total) by mouth daily.    Inpatient Medications:   . nitroGLYCERIN  0.5 inch Topical Q6H  . warfarin  4 mg Oral ONCE-1800  . Warfarin - Pharmacist Dosing Inpatient   Does not apply q1800   . sodium chloride 1,000 mL (06/22/12 1636)    Allergies: No Known Allergies  History   Social History  . Marital Status: Divorced    Spouse Name: N/A    Number of Children: N/A  . Years of Education: N/A   Occupational History  . Not on file.   Social History Main Topics  . Smoking status: Never Smoker   . Smokeless tobacco: Never Used  . Alcohol Use: No  . Drug Use: No  .  Sexually Active: No   Other Topics Concern  . Not on file   Social History Narrative  . No narrative on file     Family history: Noncontributory  Review of Systems: General: negative for chills, fever, night sweats or weight changes.  Cardiovascular: As per HPI Dermatological: negative for rash Respiratory: negative for cough or wheezing Urologic: negative for hematuria Abdominal: (+) constipation; negative for nausea, vomiting, diarrhea, bright red blood per rectum, melena, or hematemesis Neurologic: negative for visual changes, syncope, or  dizziness All other systems reviewed and are otherwise negative except as noted above.  Labs:  Component Value Date   WBC 10.2 06/22/2012   HGB 15.3 06/22/2012   HCT 45.5 06/22/2012   MCV 93.4 06/22/2012   PLT 244 06/22/2012    Lab 06/22/12 1434  NA 140  K 3.7  CL 98  CO2 29  BUN 31*  CREATININE 1.31  CALCIUM 8.9  PROT 6.6  BILITOT 0.8  ALKPHOS 174*  ALT 42  AST 29  GLUCOSE 112*     06/22/2012 14:34  Pro B Natriuretic peptide (BNP) 32268.0 (H)     06/22/2012 14:24  Troponin i, poc 0.03    Radiology/Studies:   06/22/2012 - Chest 2 View Findings: Cardiomegaly again noted.  No acute infiltrate or pulmonary edema.  Mild elevation of the right hemidiaphragm. Stable mild degenerative changes thoracic spine.  IMPRESSION: No active disease.  Cardiomegaly again noted.     EKG: 06/22/12 @ 1143 - a.fib 95bpm w/ LBBB (unchanged from prior EKG)  Physical Exam: Blood pressure 114/93, pulse 62, temperature 97.6 F (36.4 C), temperature source Oral, resp. rate 22, SpO2 94.00%. General: Well developed, elderly hispanic male, in no acute distress. Head: Normocephalic, atraumatic, sclera non-icteric, no xanthomas, nares are without discharge.  Neck: Supple. Negative for carotid bruits. (+) JVD Lungs: Fine bibasilar rales. No wheezes or rhonchi. Breathing is unlabored. Heart: Irregularly irregular with S1 S2. 1/6 systolic apical murmur. No rubs, or gallops appreciated. Abdomen: Soft, non-tender, mildly distended with normoactive bowel sounds. No rebound/guarding. No obvious abdominal masses. Msk:  Strength and tone appear normal for age. Extremities: No clubbing or cyanosis. 2+ bilat LE edema.  Distal pedal pulses are intact and equal bilaterally. Neuro: Alert and oriented X 3. Moves all extremities spontaneously. Psych:  Responds to questions appropriately with a normal affect.   Assessment and Plan:  79 y.o. Tyler Christensen male w/ PMHx significant for Chronic Systolic CHF (EF 16%), NICM (normal coronary  anatomy 2005), LBBB, PAF (on coumadin), HTN, HLD, CVA, and Seizure (2/2 CVA 05/2012) who presented to [facility] on 06/22/2012 with complaints of shortness of breath and chest pain.  1. Acute on Chronic Systolic CHF 2. NICM EF 15-20% 3. LBBB (old) 4. Atrial Fibrillation on coumadin 5. Precordial pain 6. Hypertension 7. Hyperlipidemia 8. Constipation 9. Recent CVA c/b seizure  Patient presents with progressive shortness of breath, edema, and chest pain. He was recently hospitalized for CVA complicated by a seizure and found to have atrial fibrillation and an EF of 15-20%. He has a history of NICM EF 10-20% with normal coronary anatomy by cath in 2005. He has not followed up with cardiology since that time. He was to follow up with Dr. Eden Emms on 1/14, but due to the symptoms above he sought medical attention today. His clinical picture is consistent with acute decompensated heart failure, although I am unsure of what put him over the edge as he says he has been compliant with all medications.  He is already feeling better with diuresis in the ED. He is hemodynamically stable. poc troponin is normal. EKG is indeterminate due to LBBB (old). CXR is without infiltrate or effusion. He is rate controlled and therapeutic on coumadin. Dig level is slightly supratherapeutic. Agree with Lasix 40mg  IV bid with close monitoring of renal function and electrolytes. Will reassess volume status tomorrow and adjust as needed. Reduce digoxin to 0.0625mg  daily. Do not suspect ACS. Chest pain likely r/t volume overload. Cycle cardiac enzymes. Would benefit from EP evaluation for consideration of AICD.   Signed, HOPE, JESSICA PA-C 06/22/2012, 5:16 PM Patient seen in the emergency room with [facility].  I agree with assessment and plan as noted above.  His history was taken with the help of a telephone interpreter.  He is responding nicely to dose of intravenous Lasix given in the emergency room.  We will cut  back on his digoxin dose.  Physical examination reveals minimal inspiratory rales and soft apical systolic murmur of mitral regurgitation.

## 2012-06-23 DIAGNOSIS — E785 Hyperlipidemia, unspecified: Secondary | ICD-10-CM

## 2012-06-23 LAB — CBC WITH DIFFERENTIAL/PLATELET
Basophils Relative: 1 % (ref 0–1)
Eosinophils Absolute: 0.5 10*3/uL (ref 0.0–0.7)
HCT: 41.4 % (ref 39.0–52.0)
Hemoglobin: 13.8 g/dL (ref 13.0–17.0)
Lymphs Abs: 1.8 10*3/uL (ref 0.7–4.0)
MCH: 30.9 pg (ref 26.0–34.0)
MCHC: 33.3 g/dL (ref 30.0–36.0)
MCV: 92.8 fL (ref 78.0–100.0)
Monocytes Absolute: 0.8 10*3/uL (ref 0.1–1.0)
Monocytes Relative: 9 % (ref 3–12)
Neutrophils Relative %: 66 % (ref 43–77)
RBC: 4.46 MIL/uL (ref 4.22–5.81)

## 2012-06-23 LAB — BASIC METABOLIC PANEL
BUN: 36 mg/dL — ABNORMAL HIGH (ref 6–23)
CO2: 32 mEq/L (ref 19–32)
Chloride: 101 mEq/L (ref 96–112)
GFR calc non Af Amer: 37 mL/min — ABNORMAL LOW (ref >90)
Glucose, Bld: 95 mg/dL (ref 70–99)
Potassium: 4.9 mEq/L (ref 3.5–5.1)
Sodium: 142 mEq/L (ref 135–145)

## 2012-06-23 LAB — TROPONIN I
Troponin I: <0.3 ng/mL (ref <0.30)
Troponin I: <0.3 ng/mL (ref <0.30)

## 2012-06-23 MED ORDER — BISACODYL 10 MG RE SUPP: 10.0000 mg | Freq: Once | RECTAL | Status: DC

## 2012-06-23 MED ORDER — POLYETHYLENE GLYCOL 3350 17 G PO PACK
17.0000 g | PACK | Freq: Every day | ORAL | Status: DC
  Administered 2012-06-23 – 2012-07-07 (×6): 17 g via ORAL
  Filled 2012-06-23 (×18): qty 1

## 2012-06-23 MED ORDER — FUROSEMIDE 10 MG/ML IJ SOLN
40.0000 mg | Freq: Every day | INTRAMUSCULAR | Status: DC
  Administered 2012-06-24: 40 mg via INTRAVENOUS
  Filled 2012-06-23: qty 4

## 2012-06-23 MED ORDER — BISACODYL 10 MG RE SUPP: 10.0000 mg | Freq: Every day | RECTAL | Status: DC | PRN

## 2012-06-23 MED ORDER — DOCUSATE SODIUM 100 MG PO CAPS
200.0000 mg | ORAL_CAPSULE | Freq: Two times a day (BID) | ORAL | Status: DC
  Administered 2012-06-23 – 2012-07-09 (×20): 200 mg via ORAL
  Filled 2012-06-23 (×38): qty 2

## 2012-06-23 MED ORDER — MILRINONE IN DEXTROSE 20 MG/100ML IV SOLN
0.2500 ug/kg/min | INTRAVENOUS | Status: DC
  Administered 2012-06-23: 0.125 ug/kg/min via INTRAVENOUS
  Administered 2012-06-24 – 2012-06-29 (×6): 0.25 ug/kg/min via INTRAVENOUS
  Filled 2012-06-23 (×10): qty 100

## 2012-06-23 NOTE — Progress Notes (Addendum)
ANTICOAGULATION CONSULT NOTE - Follow up Consult  Pharmacy Consult for Warfarin Indication: atrial fibrillation  No Known Allergies  Patient Measurements: Height: 5\' 8"  (172.7 cm) Weight: 162 lb 6.4 oz (73.664 kg) IBW/kg (Calculated) : 68.4    Vital Signs: Temp: 97.8 F (36.6 C) (01/04 0947) Temp src: Oral (01/04 0947) BP: 94/68 mmHg (01/04 0947) Pulse Rate: 66  (01/04 1303)  Labs:  Basename 06/23/12 0614 06/23/12 0020 06/22/12 2021 06/22/12 1434  HGB 13.8 -- -- 15.3  HCT 41.4 -- -- 45.5  PLT 234 -- -- 244  APTT -- -- -- 39*  LABPROT 38.2* -- -- 28.5*  INR 4.24* -- -- 2.86*  HEPARINUNFRC -- -- -- --  CREATININE 1.70* -- -- 1.31  CKTOTAL -- -- -- --  CKMB -- -- -- --  TROPONINI <0.30 <0.30 <0.30 --    Estimated Creatinine Clearance: 35.2 ml/min (by C-G formula based on Cr of 1.7).   Medical History: Past Medical History  Diagnosis Date  . HTN (hypertension)   . High cholesterol   . CHF (congestive heart failure)   . BBBB (bilateral bundle branch block)   . LBBB (left bundle branch block)   . Weakness   . Tired   . Seizures 06/11/2012    new onset/notes (06/11/2012)  . SOB (shortness of breath)     "sometimes when I lay down;; related to not taking my RX" (06/11/2012)    Medications:  Prescriptions prior to admission  Medication Sig Dispense Refill  . carvedilol (COREG) 6.25 MG tablet Take 6.25 mg by mouth 2 (two) times daily with a meal.      . Choline Fenofibrate (TRILIPIX) 45 MG capsule Take 45 mg by mouth daily.      . digoxin (LANOXIN) 0.125 MG tablet Take 0.125 mg by mouth daily.      . furosemide (LASIX) 40 MG tablet Take 40 mg by mouth 2 (two) times daily.      Marland Kitchen HYDROcodone-acetaminophen (NORCO) 5-325 MG per tablet Take 1 tablet by mouth every 6 (six) hours as needed for pain.  30 tablet  0  . loratadine (CLARITIN) 10 MG tablet Take 10 mg by mouth daily.      . ranitidine (ZANTAC) 300 MG tablet Take 300 mg by mouth daily.      . simvastatin  (ZOCOR) 20 MG tablet Take 20 mg by mouth every evening.      Marland Kitchen spironolactone (ALDACTONE) 25 MG tablet Take 25 mg by mouth daily.      Marland Kitchen warfarin (COUMADIN) 4 MG tablet Take 1 tablet (4 mg total) by mouth daily.  30 tablet  0    Assessment: 79 y.o.  male  admitted 06/22/2012 with CP, SOB, after recent hospitalization.  Pharmacy consulted to dose warfarin PTA dose 4 mg daily,initial INR was 2.86, now supratherapeutic at 4.24. No bleeding noted.   Goal of Therapy:  INR 2-3 Monitor platelets by anticoagulation protocol: Yes   Plan:  1. Hold Warfarin tonight 2. Follow up INR in am Thank you for allowing pharmacy to be a part of this patients care team.   Vania Rea. Darin Engels.D. Clinical Pharmacist Pager [phone] Phone [phone] 06/23/2012 1:17 PM

## 2012-06-23 NOTE — Progress Notes (Signed)
Via phone interpretor Pt states that he is feeling better, less chest tightness since start of milirone drop.

## 2012-06-23 NOTE — Progress Notes (Addendum)
[facility]                                                                                Patient Demographics  Tyler Christensen, is a 79 y.o. male  ZOX:[id number]  WJX:[id number]  DOB - 1932/11/20  Admit date - 06/22/2012  Admitting Physician Leroy Sea, MD  Outpatient Primary MD for the patient is Jearld Lesch, MD  LOS - 1   Chief Complaint  Patient presents with  . Chest Pain        Assessment & Plan    1. Shortness of breath and orthopnea along with mild left-sided chest tightness in a patient with history of chronic systolic CHF with EF 20% in December of 2013. Presenting with 2+ leg edema, elevated proBNP. All due to acute on chronic systolic CHF - continue Lasix as tolerated by blood pressure, blood pressure cannot tolerate ACE/ARB and has been discontinued, cardiology seeing the patient, Coreg to be continued with parameters to hold, will be started on IV milrinone to Augment cardiac output and to relieve his heart failure symptoms.    2. Paroxysmal atrial fibrillation and left bundle branch block.- No acute issues: Goal will be rate control, Coreg will be continued as tolerated by his blood pressure, digoxin dose adjusted, pharmacy will monitor Coumadin.   Lab Results  Component Value Date   INR 4.24* 06/23/2012   INR 2.86* 06/22/2012     3. 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 No acute issues no new focal logical deficits, continue Coumadin as above.     4. Acute renal failure due to combination of severe systolic heart failure, low cardiac output and like he ACE inhibitor. Discontinue ACE inhibitor, milrinone as above and monitor BMP serially    5. Mild ileus, bowel regimen initiated, had a BM and is passing flatus. No abdominal pain or nausea.     Code Status: Will  Family Communication: With the patient with the help of phone interpreter  Disposition Plan: Home   Procedures    Consults  cardiology   DVT Prophylaxis  heparin  Lab Results  Component Value Date   PLT 234 06/23/2012    Medications  Scheduled Meds:   . bisacodyl  10 mg Rectal Once  . carvedilol  6.25 mg Oral BID WC  . digoxin  0.0625 mg Oral Daily  . docusate sodium  200 mg Oral BID  . famotidine  40 mg Oral Daily  . fenofibrate  54 mg Oral Daily  . furosemide  40 mg Intravenous Daily  . loratadine  10 mg Oral Daily  . nitroGLYCERIN  0.5 inch Topical Q6H  . polyethylene glycol  17 g Oral Daily  . simvastatin  20 mg Oral QPM  . sodium chloride  3 mL Intravenous Q12H  . spironolactone  25 mg Oral Daily  . Warfarin - Pharmacist Dosing Inpatient   Does not apply q1800   Continuous Infusions:   . milrinone     PRN Meds:.sodium chloride, acetaminophen, bisacodyl, nitroGLYCERIN, ondansetron (ZOFRAN) IV, sodium chloride  Antibiotics     Anti-infectives    None       Time Spent in  minutes   35   Fabien Travelstead K M.D on 06/23/2012 at 10:35 AM  Between 7am to 7pm - Pager - [phone]  After 7pm go to [email or web address] - password [email or web address]  And look for the night coverage person covering for me after hours  [facility] Office  [phone]    Subjective:   [name] today has, No headache, No chest pain, No abdominal pain - No Nausea, No new weakness tingling or numbness, No Cough - shortness of breath, is passing flatus and had a small BM on 06/22/2012.  Objective:   Filed Vitals:   06/23/12 0000 06/23/12 0237 06/23/12 0445 06/23/12 0947  BP: 122/77 112/68 104/52 94/68  Pulse: 58 53 53 59  Temp: 97.6 F (36.4 C)  97 F (36.1 C) 97.8 F (36.6 C)  TempSrc: Oral  Oral Oral  Resp: 22 18 18 20   Height:      Weight:   73.664 kg (162 lb 6.4 oz)   SpO2: 100%  100% 94%    Wt Readings from Last 3 Encounters:  06/23/12 73.664 kg (162 lb 6.4 oz)  06/12/12 71.305 kg (157 lb 3.2 oz)     Intake/Output Summary (Last 24 hours) at 06/23/12 1035 Last data filed at 06/23/12  0809  Gross per 24 hour  Intake    363 ml  Output   1636 ml  Net  -1273 ml    Exam Awake Alert, Oriented X 3, No new F.N deficits, Normal affect [place].AT,PERRAL Supple Neck,No JVD, No cervical lymphadenopathy appriciated.  Symmetrical Chest wall movement, Good air movement bilaterally, bibasilar rales RRR,No Gallops,Rubs or new Murmurs, No Parasternal Heave +ve B.Sounds, Abd Soft, Non tender, No organomegaly appriciated, No rebound - guarding or rigidity. No Cyanosis, Clubbing , 2+ edema, No new Rash or bruise     Data Review   Micro Results No results found for this or any previous visit (from the past 240 hour(s)).  Radiology Reports Dg Chest 2 View  06/22/2012  *RADIOLOGY REPORT*  Clinical Data: Chest pain  CHEST - 2 VIEW  Comparison: 06/11/2012  Findings: Cardiomegaly again noted.  No acute infiltrate or pulmonary edema.  Mild elevation of the right hemidiaphragm. Stable mild degenerative changes thoracic spine.  IMPRESSION: No active disease.  Cardiomegaly again noted.   Original Report Authenticated By: Natasha Mead, M.D.    Ct Head Wo Contrast  06/11/2012  *RADIOLOGY REPORT*  Clinical Data: Shaking; gasping for air.  Somewhat disoriented.  CT HEAD WITHOUT CONTRAST  Technique:  Contiguous axial images were obtained from the base of the skull through the vertex without contrast.  Comparison: None.  Findings: There is no evidence of acute infarction, mass lesion, or intra- or extra-axial hemorrhage on CT.  Prominence of the ventricles and sulci reflects mild cortical volume loss.  Mild cerebellar atrophy is noted.  The brainstem and fourth ventricle are within normal limits.  The basal ganglia are unremarkable in appearance.  The cerebral hemispheres demonstrate grossly normal gray-white differentiation. No mass effect or midline shift is seen.  There is no evidence of fracture; visualized osseous structures are unremarkable in appearance.  The visualized portions of the orbits are within  normal limits.  Mucosal thickening is noted within the left maxillary sinus; the remaining paranasal sinuses and mastoid air cells are well-aerated.  No significant soft tissue abnormalities are seen.  IMPRESSION:  1.  No acute intracranial pathology seen on CT. 2.  Mild cortical volume loss.   Original Report Authenticated By: Tonia Ghent,  M.D.    Mr Maxine Glenn Head Wo Contrast  06/11/2012  *RADIOLOGY REPORT*  Clinical Data:  New onset seizures.  Hypertension and high cholesterol.  MRI BRAIN WITHOUT CONTRAST MRA HEAD WITHOUT CONTRAST  Technique: Multiplanar, multiecho pulse sequences of the brain and surrounding structures were obtained according to standard protocol without intravenous contrast.  Angiographic images of the head were obtained using MRA technique without contrast.  Comparison: 06/11/2012 head CT.  No comparison brain MR.  MRI HEAD  Findings:  Acute non hemorrhagic small infarct posterior right temporal - parietal lobe junction.  Tiny acute non hemorrhagic infarct posterior right corona radiata.  Tiny acute infarct right parietal lobe.  Abnormal flow within the right middle cerebral artery branches. Please see below.  Global atrophy without hydrocephalus.  No intracranial hemorrhage.  No intracranial mass lesion detected on this unenhanced exam.  Moderate mucosal thickening left maxillary sinus.  Cervical spondylotic changes with spinal stenosis C4-5 with mild cord flattening.  IMPRESSION: Acute non hemorrhagic small infarct posterior right temporal - parietal lobe junction.  Tiny acute non hemorrhagic infarct posterior right corona radiata.  Tiny acute infarct right parietal lobe.  Global atrophy.  C4-5 cervical spondylotic changes with spinal stenosis and mild cord flattening.  Moderate mucosal thickening left maxillary sinus.  MRA HEAD  Findings: Abrupt cut off of flow to large segment of right posterior cerebral artery branches just beyond the right middle cerebral artery bifurcation.  No  significant stenosis proximal to this level.  Right vertebral artery is dominant.  No high-grade stenosis of the vertebral arteries or basilar artery.  Mild narrowing proximal left PICA.  Poor delineation left AICA.  Slight irregularity superior cerebellar arteries bilaterally.  Mild irregularity posterior cerebral artery branch vessels with decreased visualization left posterior cerebral artery distal branches.  No aneurysm or vascular malformation noted.  IMPRESSION: Abrupt cut off of flow to large segment of right posterior cerebral artery branches just beyond the right middle cerebral artery bifurcation.  Please see above.  This has been made a PRA call report utilizing dashboard call feature.   Original Report Authenticated By: Lacy Duverney, M.D.    Mr Brain Wo Contrast  06/11/2012  *RADIOLOGY REPORT*  Clinical Data:  New onset seizures.  Hypertension and high cholesterol.  MRI BRAIN WITHOUT CONTRAST MRA HEAD WITHOUT CONTRAST  Technique: Multiplanar, multiecho pulse sequences of the brain and surrounding structures were obtained according to standard protocol without intravenous contrast.  Angiographic images of the head were obtained using MRA technique without contrast.  Comparison: 06/11/2012 head CT.  No comparison brain MR.  MRI HEAD  Findings:  Acute non hemorrhagic small infarct posterior right temporal - parietal lobe junction.  Tiny acute non hemorrhagic infarct posterior right corona radiata.  Tiny acute infarct right parietal lobe.  Abnormal flow within the right middle cerebral artery branches. Please see below.  Global atrophy without hydrocephalus.  No intracranial hemorrhage.  No intracranial mass lesion detected on this unenhanced exam.  Moderate mucosal thickening left maxillary sinus.  Cervical spondylotic changes with spinal stenosis C4-5 with mild cord flattening.  IMPRESSION: Acute non hemorrhagic small infarct posterior right temporal - parietal lobe junction.  Tiny acute non  hemorrhagic infarct posterior right corona radiata.  Tiny acute infarct right parietal lobe.  Global atrophy.  C4-5 cervical spondylotic changes with spinal stenosis and mild cord flattening.  Moderate mucosal thickening left maxillary sinus.  MRA HEAD  Findings: Abrupt cut off of flow to large segment of right posterior cerebral artery branches just  beyond the right middle cerebral artery bifurcation.  No significant stenosis proximal to this level.  Right vertebral artery is dominant.  No high-grade stenosis of the vertebral arteries or basilar artery.  Mild narrowing proximal left PICA.  Poor delineation left AICA.  Slight irregularity superior cerebellar arteries bilaterally.  Mild irregularity posterior cerebral artery branch vessels with decreased visualization left posterior cerebral artery distal branches.  No aneurysm or vascular malformation noted.  IMPRESSION: Abrupt cut off of flow to large segment of right posterior cerebral artery branches just beyond the right middle cerebral artery bifurcation.  Please see above.  This has been made a PRA call report utilizing dashboard call feature.   Original Report Authenticated By: Lacy Duverney, M.D.    Dg Chest Port 1 View  06/11/2012  *RADIOLOGY REPORT*  Clinical Data: Chest pain and shortness of breath; shaking.  PORTABLE CHEST - 1 VIEW  Comparison: Chest radiograph performed 04/04/2012  Findings: The lungs are well-aerated.  Mild bilateral atelectasis is noted.  There is no evidence of pleural effusion or pneumothorax.  The cardiomediastinal silhouette is enlarged; calcification is noted within the aortic arch.  No acute osseous abnormalities are seen.  IMPRESSION: Mild bilateral atelectasis noted; stable cardiomegaly.   Original Report Authenticated By: Tonia Ghent, M.D.    Dg Abd 2 Views  06/11/2012  *RADIOLOGY REPORT*  Clinical Data: Nausea, vomiting, abdominal pain and distension.  ABDOMEN - 2 VIEW  Comparison: None.  Findings: The visualized  bowel gas pattern is unremarkable. Scattered air and stool filled loops of colon are seen; no abnormal dilatation of small bowel loops is seen to suggest small bowel obstruction.  No free intra-abdominal air is identified, though evaluation for free air is limited on a single supine view.  The visualized osseous structures are within normal limits; the sacroiliac joints are unremarkable in appearance.  Mild left basilar opacity likely reflects atelectasis.  IMPRESSION:  1.  Unremarkable bowel gas pattern; no free intra-abdominal air seen. 2.  Mild left basilar airspace opacity likely reflects atelectasis.   Original Report Authenticated By: Tonia Ghent, M.D.    Dg Abd Portable 1v  06/22/2012  *RADIOLOGY REPORT*  Clinical Data: Constipation.  PORTABLE ABDOMEN - 1 VIEW  Comparison: 06/11/2012.  Findings: Minimally dilated small bowel loops in the mid abdomen. Normal caliber colon.  Moderate amount of stool in the rectum with smaller amounts of stool elsewhere in the colon.  Lumbar spine degenerative changes.  IMPRESSION:  1.  Minimal small bowel ileus or partial obstruction. 2.  Moderate amount of stool in the rectum with a lesser amount of stool elsewhere in the colon.   Original Report Authenticated By: Beckie Salts, M.D.     CBC  Lab 06/23/12 0614 06/22/12 1434  WBC 9.3 10.2  HGB 13.8 15.3  HCT 41.4 45.5  PLT 234 244  MCV 92.8 93.4  MCH 30.9 31.4  MCHC 33.3 33.6  RDW 14.3 14.4  LYMPHSABS 1.8 --  MONOABS 0.8 --  EOSABS 0.5 --  BASOSABS 0.1 --  BANDABS -- --    Chemistries   Lab 06/23/12 0614 06/22/12 1434  NA 142 140  K 4.9 3.7  CL 101 98  CO2 32 29  GLUCOSE 95 112*  BUN 36* 31*  CREATININE 1.70* 1.31  CALCIUM 8.6 8.9  MG -- --  AST -- 29  ALT -- 42  ALKPHOS -- 174*  BILITOT -- 0.8   ------------------------------------------------------------------------------------------------------------------ estimated creatinine clearance is 35.2 ml/min (by C-G formula based on Cr of  1.7). ------------------------------------------------------------------------------------------------------------------ No results found for this basename: HGBA1C:2 in the last 72 hours ------------------------------------------------------------------------------------------------------------------ No results found for this basename: CHOL:2,HDL:2,LDLCALC:2,TRIG:2,CHOLHDL:2,LDLDIRECT:2 in the last 72 hours ------------------------------------------------------------------------------------------------------------------ No results found for this basename: TSH,T4TOTAL,FREET3,T3FREE,THYROIDAB in the last 72 hours ------------------------------------------------------------------------------------------------------------------ No results found for this basename: VITAMINB12:2,FOLATE:2,FERRITIN:2,TIBC:2,IRON:2,RETICCTPCT:2 in the last 72 hours  Coagulation profile  Lab 06/23/12 0614 06/22/12 1434  INR 4.24* 2.86*  PROTIME -- --    No results found for this basename: DDIMER:2 in the last 72 hours  Cardiac Enzymes  Lab 06/23/12 0614 06/23/12 0020 06/22/12 2021  CKMB -- -- --  TROPONINI <0.30 <0.30 <0.30  MYOGLOBIN -- -- --   ------------------------------------------------------------------------------------------------------------------ No components found with this basename: POCBNP:3

## 2012-06-23 NOTE — Progress Notes (Signed)
Patient ID: Tyler Christensen, male   DOB: October 14, 1932, 79 y.o.   MRN: [id number] Subjective:  Still with dyspnea and mild chest pressure  Objective:  Vital Signs in the last 24 hours: Temp:  [96.8 F (36 C)-97.6 F (36.4 C)] 97 F (36.1 C) (01/04 0445) Pulse Rate:  [45-73] 53  (01/04 0445) Resp:  [18-30] 18  (01/04 0445) BP: (104-130)/(52-97) 104/52 mmHg (01/04 0445) SpO2:  [90 %-100 %] 100 % (01/04 0445) Weight:  [160 lb 7.9 oz (72.8 kg)-162 lb 6.4 oz (73.664 kg)] 162 lb 6.4 oz (73.664 kg) (01/04 0445)  Intake/Output from previous day: 01/03 0701 - 01/04 0700 In: 3 [I.V.:3] Out: 1336 [Urine:1335; Stool:1] Intake/Output from this shift: Total I/O In: 360 [P.O.:360] Out: 300 [Urine:300]  Physical Exam: Chronically ill appearing NAD HEENT: Unremarkable Neck:  8 cm JVD, no thyromegally Lungs:  Rales in the bases, 1/4 up bilaterally HEART:  Regular rate rhythm, no murmurs, no rubs, no clicks Abd:  Flat, positive bowel sounds, no organomegally, no rebound, no guarding Ext:  2 plus pulses, 2+ edema, no cyanosis, no clubbing Skin:  No rashes no nodules Neuro:  CN II through XII intact, motor grossly intact  Lab Results:  Basename 06/23/12 0614 06/22/12 1434  WBC 9.3 10.2  HGB 13.8 15.3  PLT 234 244    Basename 06/23/12 0614 06/22/12 1434  NA 142 140  K 4.9 3.7  CL 101 98  CO2 32 29  GLUCOSE 95 112*  BUN 36* 31*  CREATININE 1.70* 1.31    Basename 06/23/12 0614 06/23/12 0020  TROPONINI <0.30 <0.30   Hepatic Function Panel  Basename 06/22/12 1434  PROT 6.6  ALBUMIN 3.4*  AST 29  ALT 42  ALKPHOS 174*  BILITOT 0.8  BILIDIR --  IBILI --   No results found for this basename: CHOL in the last 72 hours No results found for this basename: PROTIME in the last 72 hours  Imaging: Dg Chest 2 View  06/22/2012  *RADIOLOGY REPORT*  Clinical Data: Chest pain  CHEST - 2 VIEW  Comparison: 06/11/2012  Findings: Cardiomegaly again noted.  No acute infiltrate or pulmonary edema.   Mild elevation of the right hemidiaphragm. Stable mild degenerative changes thoracic spine.  IMPRESSION: No active disease.  Cardiomegaly again noted.   Original Report Authenticated By: Natasha Mead, M.D.    Dg Abd Portable 1v  06/22/2012  *RADIOLOGY REPORT*  Clinical Data: Constipation.  PORTABLE ABDOMEN - 1 VIEW  Comparison: 06/11/2012.  Findings: Minimally dilated small bowel loops in the mid abdomen. Normal caliber colon.  Moderate amount of stool in the rectum with smaller amounts of stool elsewhere in the colon.  Lumbar spine degenerative changes.  IMPRESSION:  1.  Minimal small bowel ileus or partial obstruction. 2.  Moderate amount of stool in the rectum with a lesser amount of stool elsewhere in the colon.   Original Report Authenticated By: Beckie Salts, M.D.     Cardiac Studies: Tele - atrial fibrillation with a controlled VR Assessment/Plan:  1. Acute on chronic systolic CHF 2. Atrial fibrillation, now with a controlled VR 3. H/o stroke 4. HTN 5. LBBB Rec: He is still volume ovrloaded. His lbbb and creatinine is going up and blood pressure is soft. Will start low dose milrinone.  LOS: 1 day    Lewayne Bunting, M.D. 06/23/2012, 9:37 AM

## 2012-06-24 LAB — BASIC METABOLIC PANEL
Calcium: 8.8 mg/dL (ref 8.4–10.5)
Calcium: 8.8 mg/dL (ref 8.4–10.5)
Creatinine, Ser: 1.12 mg/dL (ref 0.50–1.35)
GFR calc Af Amer: 53 mL/min — ABNORMAL LOW (ref >90)
GFR calc Af Amer: 71 mL/min — ABNORMAL LOW (ref >90)
GFR calc non Af Amer: 46 mL/min — ABNORMAL LOW (ref >90)
GFR calc non Af Amer: 61 mL/min — ABNORMAL LOW (ref >90)
Glucose, Bld: 100 mg/dL — ABNORMAL HIGH (ref 70–99)
Potassium: 4.6 mEq/L (ref 3.5–5.1)
Sodium: 140 mEq/L (ref 135–145)
Sodium: 137 mEq/L (ref 135–145)

## 2012-06-24 LAB — PROTIME-INR
INR: 3.03 — ABNORMAL HIGH (ref 0.00–1.49)
Prothrombin Time: 29.8 seconds — ABNORMAL HIGH (ref 11.6–15.2)

## 2012-06-24 MED ORDER — WARFARIN SODIUM 2 MG PO TABS
2.0000 mg | ORAL_TABLET | Freq: Once | ORAL | Status: AC
  Administered 2012-06-24: 2 mg via ORAL
  Filled 2012-06-24: qty 1

## 2012-06-24 MED ORDER — PEG 3350-KCL-NA BICARB-NACL 420 G PO SOLR
1000.0000 mL | Freq: Once | ORAL | Status: AC
  Administered 2012-06-24: 1000 mL via ORAL
  Filled 2012-06-24: qty 4000

## 2012-06-24 MED ORDER — FUROSEMIDE 10 MG/ML IJ SOLN
40.0000 mg | Freq: Two times a day (BID) | INTRAMUSCULAR | Status: DC
  Administered 2012-06-24 – 2012-06-26 (×4): 40 mg via INTRAVENOUS
  Filled 2012-06-24 (×7): qty 4

## 2012-06-24 NOTE — Progress Notes (Signed)
ANTICOAGULATION CONSULT NOTE - Follow up Consult  Pharmacy Consult for Warfarin Indication: atrial fibrillation  No Known Allergies  Patient Measurements: Height: 5\' 8"  (172.7 cm) Weight: 162 lb 1.6 oz (73.528 kg) (scale b) IBW/kg (Calculated) : 68.4    Vital Signs: Temp: 96.8 F (36 C) (01/05 0512) Temp src: Oral (01/05 0512) BP: 97/58 mmHg (01/05 0951) Pulse Rate: 58  (01/05 0951)  Labs:  Basename 06/24/12 0603 06/23/12 1914 06/23/12 0020 06/22/12 2021 06/22/12 1434  HGB -- 13.8 -- -- 15.3  HCT -- 41.4 -- -- 45.5  PLT -- 234 -- -- 244  APTT -- -- -- -- 39*  LABPROT 29.8* 38.2* -- -- 28.5*  INR 3.03* 4.24* -- -- 2.86*  HEPARINUNFRC -- -- -- -- --  CREATININE 1.42* 1.70* -- -- 1.31  CKTOTAL -- -- -- -- --  CKMB -- -- -- -- --  TROPONINI -- <0.30 <0.30 <0.30 --    Estimated Creatinine Clearance: 42.1 ml/min (by C-G formula based on Cr of 1.42).   Medical History: Past Medical History  Diagnosis Date  . HTN (hypertension)   . High cholesterol   . CHF (congestive heart failure)   . BBBB (bilateral bundle branch block)   . LBBB (left bundle branch block)   . Weakness   . Tired   . Seizures 06/11/2012    new onset/notes (06/11/2012)  . SOB (shortness of breath)     "sometimes when I lay down;; related to not taking my RX" (06/11/2012)    Medications:  Prescriptions prior to admission  Medication Sig Dispense Refill  . carvedilol (COREG) 6.25 MG tablet Take 6.25 mg by mouth 2 (two) times daily with a meal.      . Choline Fenofibrate (TRILIPIX) 45 MG capsule Take 45 mg by mouth daily.      . digoxin (LANOXIN) 0.125 MG tablet Take 0.125 mg by mouth daily.      . furosemide (LASIX) 40 MG tablet Take 40 mg by mouth 2 (two) times daily.      Marland Kitchen HYDROcodone-acetaminophen (NORCO) 5-325 MG per tablet Take 1 tablet by mouth every 6 (six) hours as needed for pain.  30 tablet  0  . loratadine (CLARITIN) 10 MG tablet Take 10 mg by mouth daily.      . ranitidine (ZANTAC)  300 MG tablet Take 300 mg by mouth daily.      . simvastatin (ZOCOR) 20 MG tablet Take 20 mg by mouth every evening.      Marland Kitchen spironolactone (ALDACTONE) 25 MG tablet Take 25 mg by mouth daily.      Marland Kitchen warfarin (COUMADIN) 4 MG tablet Take 1 tablet (4 mg total) by mouth daily.  30 tablet  0    Assessment: 79 y.o.  male  admitted 06/22/2012 with CP, SOB, after recent hospitalization.  Pharmacy consulted to dose warfarin, PTA dose 4 mg daily,initial INR was 2.86, yesterdays dose held for supratherapeutic of 4.24. Today's INR is 3.0 No bleeding noted.   Goal of Therapy:  INR 2-3 Monitor platelets by anticoagulation protocol: Yes   Plan:  1. Warfarin 2mg  tonight 2. Follow up INR in am  Thank you for allowing pharmacy to be a part of this patients care team. Vania Rea. Darin Engels.D. Clinical Pharmacist Pager [phone] Phone [phone] 06/24/2012 12:38 PM

## 2012-06-24 NOTE — Progress Notes (Signed)
[facility]                                                                                Patient Demographics  Tyler Christensen, is a 79 y.o. male  WUJ:[id number]  [id number]  DOB - [birth year]  Admit date - 06/22/2012  Admitting Physician Leroy Sea, MD  Outpatient Primary MD for the patient is Jearld Lesch, MD  LOS - 2   Chief Complaint  Patient presents with  . Chest Pain        Assessment & Plan    1. Shortness of breath and orthopnea along with mild left-sided chest tightness in a patient with history of chronic systolic CHF with EF 20% in December of 2013. Presented with 2+ leg edema, elevated proBNP. All due to acute on chronic systolic CHF - continue Lasix as tolerated by blood pressure, blood pressure cannot tolerate ACE/ARB and has been discontinued, cardiology seeing the patient, Coreg to be continued with parameters to hold, Has been started on IV milrinone to Augment cardiac output and to relieve his heart failure symptoms.Clinically improved.  ACE inhibitor/ARB not given 2 to acute renal insufficiency along with extremely low blood pressures.   2. Paroxysmal atrial fibrillation and left bundle branch block.- No acute issues: Goal will be rate control, Coreg will be continued as tolerated by his blood pressure, digoxin dose adjusted, pharmacy will monitor Coumadin.   Lab Results  Component Value Date   INR 3.03* 06/24/2012   INR 4.24* 06/23/2012   INR 2.86* 06/22/2012     3. 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 No acute issues no new focal logical deficits, continue Coumadin as above.     4. Acute renal failure due to combination of severe systolic heart failure, low cardiac output and like he ACE inhibitor. Discontinue ACE inhibitor, milrinone as above and monitor BMP serially,Renal function improving.    5. Mild ileus, bowel regimen initiated, had a BM and is passing flatus. No abdominal pain or  nausea.     Code Status: Will  Family Communication: With the patient with the help of phone interpreter  Disposition Plan: Home   Procedures    Consults cardiology   DVT Prophylaxis  heparin  Lab Results  Component Value Date   PLT 234 06/23/2012    Medications  Scheduled Meds:    . bisacodyl  10 mg Rectal Once  . carvedilol  6.25 mg Oral BID WC  . digoxin  0.0625 mg Oral Daily  . docusate sodium  200 mg Oral BID  . famotidine  40 mg Oral Daily  . fenofibrate  54 mg Oral Daily  . furosemide  40 mg Intravenous Daily  . loratadine  10 mg Oral Daily  . nitroGLYCERIN  0.5 inch Topical Q6H  . polyethylene glycol  17 g Oral Daily  . polyethylene glycol-electrolytes  1,000 mL Oral Once  . simvastatin  20 mg Oral QPM  . sodium chloride  3 mL Intravenous Q12H  . spironolactone  25 mg Oral Daily  . Warfarin - Pharmacist Dosing Inpatient   Does not apply q1800   Continuous Infusions:    . milrinone Stopped (  06/24/12 1007)   PRN Meds:.sodium chloride, acetaminophen, bisacodyl, nitroGLYCERIN, ondansetron (ZOFRAN) IV, sodium chloride  Antibiotics     Anti-infectives    None       Time Spent in minutes   35   SINGH,PRASHANT K M.D on 06/24/2012 at 10:56 AM  Between 7am to 7pm - Pager - [phone]  After 7pm go to [email or web address] - password [email or web address]  And look for the night coverage person covering for me after hours  [facility] Office  [phone]    Subjective:   [name] today has, No headache, No chest pain, No abdominal pain - No Nausea, No new weakness tingling or numbness, No Cough - shortness of breath, is passing flatus ++ and had a small BM on 06/22/2012.  Objective:   Filed Vitals:   06/23/12 1330 06/23/12 2013 06/24/12 0512 06/24/12 0951  BP: 100/43 96/57 105/61 97/58  Pulse: 55 53 59 58  Temp: 98 F (36.7 C) 96.8 F (36 C) 96.8 F (36 C)   TempSrc: Oral Oral Oral   Resp: 20 18 18 18   Height:      Weight:   73.528 kg  (162 lb 1.6 oz)   SpO2: 100% 100% 100% 95%    Wt Readings from Last 3 Encounters:  06/24/12 73.528 kg (162 lb 1.6 oz)  06/12/12 71.305 kg (157 lb 3.2 oz)     Intake/Output Summary (Last 24 hours) at 06/24/12 1056 Last data filed at 06/23/12 2100  Gross per 24 hour  Intake 566.75 ml  Output    825 ml  Net -258.25 ml    Exam Awake Alert, Oriented X 3, No new F.N deficits, Normal affect [place].AT,PERRAL Supple Neck,No JVD, No cervical lymphadenopathy appriciated.  Symmetrical Chest wall movement, Good air movement bilaterally, bibasilar rales RRR,No Gallops,Rubs or new Murmurs, No Parasternal Heave +ve B.Sounds, Abd Soft, Non tender, No organomegaly appriciated, No rebound - guarding or rigidity. No Cyanosis, Clubbing , 2+ edema, No new Rash or bruise     Data Review   Micro Results No results found for this or any previous visit (from the past 240 hour(s)).  Radiology Reports Dg Chest 2 View  06/22/2012  *RADIOLOGY REPORT*  Clinical Data: Chest pain  CHEST - 2 VIEW  Comparison: 06/11/2012  Findings: Cardiomegaly again noted.  No acute infiltrate or pulmonary edema.  Mild elevation of the right hemidiaphragm. Stable mild degenerative changes thoracic spine.  IMPRESSION: No active disease.  Cardiomegaly again noted.   Original Report Authenticated By: Natasha Mead, M.D.    Dg Abd Portable 1v  06/22/2012  *RADIOLOGY REPORT*  Clinical Data: Constipation.  PORTABLE ABDOMEN - 1 VIEW  Comparison: 06/11/2012.  Findings: Minimally dilated small bowel loops in the mid abdomen. Normal caliber colon.  Moderate amount of stool in the rectum with smaller amounts of stool elsewhere in the colon.  Lumbar spine degenerative changes.  IMPRESSION:  1.  Minimal small bowel ileus or partial obstruction. 2.  Moderate amount of stool in the rectum with a lesser amount of stool elsewhere in the colon.   Original Report Authenticated By: Beckie Salts, M.D.     CBC  Lab 06/23/12 0614 06/22/12 1434  WBC 9.3  10.2  HGB 13.8 15.3  HCT 41.4 45.5  PLT 234 244  MCV 92.8 93.4  MCH 30.9 31.4  MCHC 33.3 33.6  RDW 14.3 14.4  LYMPHSABS 1.8 --  MONOABS 0.8 --  EOSABS 0.5 --  BASOSABS 0.1 --  BANDABS -- --  Chemistries   Lab 06/24/12 0603 06/23/12 0614 06/22/12 1434  NA 140 142 140  K 4.6 4.9 3.7  CL 99 101 98  CO2 32 32 29  GLUCOSE 100* 95 112*  BUN 28* 36* 31*  CREATININE 1.42* 1.70* 1.31  CALCIUM 8.8 8.6 8.9  MG -- -- --  AST -- -- 29  ALT -- -- 42  ALKPHOS -- -- 174*  BILITOT -- -- 0.8   ------------------------------------------------------------------------------------------------------------------ estimated creatinine clearance is 42.1 ml/min (by C-G formula based on Cr of 1.42). ------------------------------------------------------------------------------------------------------------------ No results found for this basename: HGBA1C:2 in the last 72 hours ------------------------------------------------------------------------------------------------------------------ No results found for this basename: CHOL:2,HDL:2,LDLCALC:2,TRIG:2,CHOLHDL:2,LDLDIRECT:2 in the last 72 hours ------------------------------------------------------------------------------------------------------------------ No results found for this basename: TSH,T4TOTAL,FREET3,T3FREE,THYROIDAB in the last 72 hours ------------------------------------------------------------------------------------------------------------------ No results found for this basename: VITAMINB12:2,FOLATE:2,FERRITIN:2,TIBC:2,IRON:2,RETICCTPCT:2 in the last 72 hours  Coagulation profile  Lab 06/24/12 0603 06/23/12 0614 06/22/12 1434  INR 3.03* 4.24* 2.86*  PROTIME -- -- --    No results found for this basename: DDIMER:2 in the last 72 hours  Cardiac Enzymes  Lab 06/23/12 0614 06/23/12 0020 06/22/12 2021  CKMB -- -- --  TROPONINI <0.30 <0.30 <0.30  MYOGLOBIN -- -- --    ------------------------------------------------------------------------------------------------------------------ No components found with this basename: POCBNP:3

## 2012-06-24 NOTE — Progress Notes (Signed)
Patient ID: Tyler Christensen, male   DOB: [birth year], 79 y.o.   MRN: [id number]  Subjective:  Still with dyspnea, denies chest pressure  Objective:  Vital Signs in the last 24 hours: Temp:  [96.8 F (36 C)-98 F (36.7 C)] 96.8 F (36 C) (01/05 0512) Pulse Rate:  [53-66] 58  (01/05 0951) Resp:  [18-20] 18  (01/05 0951) BP: (96-105)/(43-61) 97/58 mmHg (01/05 0951) SpO2:  [95 %-100 %] 95 % (01/05 0951) Weight:  [162 lb 1.6 oz (73.528 kg)] 162 lb 1.6 oz (73.528 kg) (01/05 0512)  Intake/Output from previous day: 01/04 0701 - 01/05 0700 In: 926.8 [P.O.:840; I.V.:16.8] Out: 1125 [Urine:1125] Intake/Output from this shift:    Physical Exam: Chronically ill appearing NAD HEENT: Unremarkable Neck:  7 cm JVD, no thyromegally Lungs:  Rales in the bases bilaterally HEART:  IRegular rate rhythm, no murmurs, no rubs, no clicks Abd:  Flat, positive bowel sounds, no organomegally, no rebound, no guarding Ext:  2 plus pulses, 2+ edema, no cyanosis, no clubbing Skin:  No rashes no nodules Neuro:  CN II through XII intact, motor grossly intact  Lab Results:  Basename 06/23/12 0614 06/22/12 1434  WBC 9.3 10.2  HGB 13.8 15.3  PLT 234 244    Basename 06/24/12 0603 06/23/12 0614  NA 140 142  K 4.6 4.9  CL 99 101  CO2 32 32  GLUCOSE 100* 95  BUN 28* 36*  CREATININE 1.42* 1.70*    Basename 06/23/12 0614 06/23/12 0020  TROPONINI <0.30 <0.30   Hepatic Function Panel  Basename 06/22/12 1434  PROT 6.6  ALBUMIN 3.4*  AST 29  ALT 42  ALKPHOS 174*  BILITOT 0.8  BILIDIR --  IBILI --   No results found for this basename: CHOL in the last 72 hours No results found for this basename: PROTIME in the last 72 hours  Imaging: Dg Chest 2 View  06/22/2012  *RADIOLOGY REPORT*  Clinical Data: Chest pain  CHEST - 2 VIEW  Comparison: 06/11/2012  Findings: Cardiomegaly again noted.  No acute infiltrate or pulmonary edema.  Mild elevation of the right hemidiaphragm. Stable mild degenerative changes  thoracic spine.  IMPRESSION: No active disease.  Cardiomegaly again noted.   Original Report Authenticated By: Natasha Mead, M.D.    Dg Abd Portable 1v  06/22/2012  *RADIOLOGY REPORT*  Clinical Data: Constipation.  PORTABLE ABDOMEN - 1 VIEW  Comparison: 06/11/2012.  Findings: Minimally dilated small bowel loops in the mid abdomen. Normal caliber colon.  Moderate amount of stool in the rectum with smaller amounts of stool elsewhere in the colon.  Lumbar spine degenerative changes.  IMPRESSION:  1.  Minimal small bowel ileus or partial obstruction. 2.  Moderate amount of stool in the rectum with a lesser amount of stool elsewhere in the colon.   Original Report Authenticated By: Beckie Salts, M.D.     Cardiac Studies: Tele - atrial fibrillation with a controlled VR Assessment/Plan:  1. Acute on chronic systolic CHF 2. Atrial fibrillation, now with a controlled VR 3. H/o stroke 4. HTN 5. LBBB Rec: He is improved on low dose milrinone. His renal function better. Will try additional diuresis. I think he would be a good candidate for BiV PPM +/- AV node ablation.  LOS: 2 days    Lewayne Bunting, M.D. 06/24/2012, 10:59 AM

## 2012-06-25 DIAGNOSIS — I4891 Unspecified atrial fibrillation: Secondary | ICD-10-CM

## 2012-06-25 LAB — CBC
Platelets: 233 10*3/uL (ref 150–400)
RBC: 4.63 MIL/uL (ref 4.22–5.81)
RDW: 14.4 % (ref 11.5–15.5)
WBC: 10.2 10*3/uL (ref 4.0–10.5)

## 2012-06-25 LAB — BASIC METABOLIC PANEL
BUN: 24 mg/dL — ABNORMAL HIGH (ref 6–23)
CO2: 27 mEq/L (ref 19–32)
Calcium: 8.8 mg/dL (ref 8.4–10.5)
Chloride: 99 mEq/L (ref 96–112)
GFR calc non Af Amer: 61 mL/min — ABNORMAL LOW (ref >90)
Glucose, Bld: 104 mg/dL — ABNORMAL HIGH (ref 70–99)
Sodium: 139 mEq/L (ref 135–145)

## 2012-06-25 MED ORDER — WARFARIN SODIUM 4 MG PO TABS
4.0000 mg | ORAL_TABLET | Freq: Once | ORAL | Status: AC
  Administered 2012-06-25: 4 mg via ORAL
  Filled 2012-06-25: qty 1

## 2012-06-25 NOTE — Progress Notes (Signed)
Chart reviewed closely by HF team. He is diuresing well on current regiment. Hemodynamically stable. HF team will assume cardiology rounding responsibility in the am on 1/7. Please call if any questions prior to that.   Malaiya Paczkowski,MD 1:39 PM

## 2012-06-25 NOTE — Progress Notes (Signed)
Patient ID: Tyler Christensen, male   DOB: April 08, 1933, 79 y.o.   MRN: [id number]  Primary Cardiologist: Patty Sermons  Subjective:  No chest pain less dyspnea  Objective:  Vital Signs in the last 24 hours: Temp:  [97 F (36.1 C)-97.7 F (36.5 C)] 97 F (36.1 C) (01/06 0525) Pulse Rate:  [58-97] 97  (01/06 0525) Resp:  [18] 18  (01/06 0525) BP: (97-117)/(50-83) 105/83 mmHg (01/06 0525) SpO2:  [93 %-100 %] 93 % (01/06 0525) Weight:  [158 lb 3.2 oz (71.759 kg)] 158 lb 3.2 oz (71.759 kg) (01/06 0525)  Intake/Output from previous day: 01/05 0701 - 01/06 0700 In: 970.4 [P.O.:720; I.V.:80.4] Out: 3800 [Urine:3800] Intake/Output from this shift:    Physical Exam: Chronically ill appearing NAD HEENT: Unremarkable Neck:  7 cm JVD, no thyromegally Lungs:  Rales in the bases bilaterally HEART:  IRegular rate rhythm, no murmurs, no rubs, no clicks Abd:  Flat, positive bowel sounds, no organomegally, no rebound, no guarding Ext:  2 plus pulses, 2+ edema, no cyanosis, no clubbing Skin:  No rashes no nodules Neuro:  CN II through XII intact, motor grossly intact  Lab Results:  Basename 06/25/12 0553 06/23/12 0614  WBC 10.2 9.3  HGB 14.7 13.8  PLT 233 234    Basename 06/24/12 1223 06/24/12 0603  NA 137 140  K 4.2 4.6  CL 98 99  CO2 27 32  GLUCOSE 138* 100*  BUN 25* 28*  CREATININE 1.12 1.42*    Basename 06/23/12 0614 06/23/12 0020  TROPONINI <0.30 <0.30   Hepatic Function Panel  Basename 06/22/12 1434  PROT 6.6  ALBUMIN 3.4*  AST 29  ALT 42  ALKPHOS 174*  BILITOT 0.8  BILIDIR --  IBILI --    Imaging: No results found.  Cardiac Studies: Tele - atrial fibrillation with a controlled VR Assessment/Plan:  1. Acute on chronic systolic CHF 2. Atrial fibrillation, now with a controlled VR 3. H/o stroke 4. HTN 5. LBBB Rec: He is improved on low dose milrinone. His renal function better. Will try additional diuresis. Per GT will need biV pacer and AV node ablatin in  future Will ask HF team to see since he is not tolerating meds and on milrinone   Charlton Haws, M.D. 06/25/2012, 7:35 AM

## 2012-06-25 NOTE — Progress Notes (Signed)
ANTICOAGULATION CONSULT NOTE - Follow Up Consult  Pharmacy Consult for Coumadin Indication: atrial fibrillation  No Known Allergies  Patient Measurements: Height: 5\' 8"  (172.7 cm) Weight: 158 lb 3.2 oz (71.759 kg) (b) IBW/kg (Calculated) : 68.4   Vital Signs: Temp: 97 F (36.1 C) (01/06 0525) Temp src: Oral (01/06 0525) BP: 105/83 mmHg (01/06 0525) Pulse Rate: 69  (01/06 0957)  Labs:  Alvira Philips 06/25/12 0553 06/24/12 1223 06/24/12 0603 06/23/12 1610 06/23/12 0020 06/22/12 2021 06/22/12 1434  HGB 14.7 -- -- 13.8 -- -- --  HCT 43.1 -- -- 41.4 -- -- 45.5  PLT 233 -- -- 234 -- -- 244  APTT -- -- -- -- -- -- 39*  LABPROT 22.7* -- 29.8* 38.2* -- -- --  INR 2.10* -- 3.03* 4.24* -- -- --  HEPARINUNFRC -- -- -- -- -- -- --  CREATININE 1.12 1.12 1.42* -- -- -- --  CKTOTAL -- -- -- -- -- -- --  CKMB -- -- -- -- -- -- --  TROPONINI -- -- -- <0.30 <0.30 <0.30 --    Estimated Creatinine Clearance: 53.4 ml/min (by C-G formula based on Cr of 1.12).  Assessment: 79yom continues on coumadin for afib. INR now therapeutic. CBC is stable. No bleeding reported.  Goal of Therapy:  INR 2-3 Monitor platelets by anticoagulation protocol: Yes   Plan:  1) Coumadin 4mg  x 1 tonight 2) Follow up INR in AM  Fredrik Rigger 06/25/2012,10:01 AM

## 2012-06-25 NOTE — Progress Notes (Signed)
[facility]                                                                                Patient Demographics  Tyler Christensen, is a 79 y.o. male  ZOX:[id number]  [id number]:[id number]  DOB - 1932-10-28  Admit date - 06/22/2012  Admitting Physician Leroy Sea, MD  Outpatient Primary MD for the patient is Jearld Lesch, MD  LOS - 3   Chief Complaint  Patient presents with  . Chest Pain        Assessment & Plan   79 year old Hispanic male with severe chronic systolic heart failure with EF of 20% who has had several admissions for acute decompensation was admitted for another episode of acute on chronic systolic heart failure, patient runs low blood pressure which prevents him from being on ACE/ARB, he also developed acute renal failure hence ACE/ARB was not given, he is requiring IV milrinone to augment his cardiac output and to stabilize his low blood pressures, on IV milrinone he is able to tolerate IV Lasix and is diuresing well, but he ologies following him closely.     1. Shortness of breath and orthopnea along with mild left-sided chest tightness in a patient with history of chronic systolic CHF with EF 20% in December of 2013. Presented with 2+ leg edema, elevated proBNP. All due to acute on chronic systolic CHF - continue Lasix as tolerated by blood pressure, blood pressure cannot tolerate ACE/ARB and has been discontinued, cardiology seeing the patient, Coreg to be continued with parameters to hold, Has been started on IV milrinone to Augment cardiac output and to relieve his heart failure symptoms. Is definitely Clinically improved. Cardiology evaluating him for biventricular pacemaker placement.  ACE inhibitor/ARB not given due to acute renal insufficiency along with extremely low blood pressures.   2. Paroxysmal atrial fibrillation and left bundle branch block.- No acute issues: Goal will be rate control, Coreg will be continued as tolerated by his blood  pressure, digoxin dose adjusted, pharmacy will monitor Coumadin.   Lab Results  Component Value Date   INR 2.10* 06/25/2012   INR 3.03* 06/24/2012   INR 4.24* 06/23/2012     3. 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 No acute issues no new focal logical deficits, continue Coumadin as above.     4. Acute renal failure due to combination of severe systolic heart failure, low cardiac output and like he ACE inhibitor. Discontinued ACE inhibitor, milrinone as above and monitor BMP serially,Renal function improving.    5. Mild ileus, bowel regimen initiated, much better after was given 1 L of GoLYTELY, had 5 BMs between 1/5 and 06/25/2012.     Code Status: Will  Family Communication: With the patient with the help of phone interpreter  Disposition Plan: Home   Procedures    Consults cardiology   DVT Prophylaxis  heparin  Lab Results  Component Value Date   PLT 233 06/25/2012    Medications  Scheduled Meds:    . bisacodyl  10 mg Rectal Once  . carvedilol  6.25 mg Oral BID WC  . digoxin  0.0625 mg Oral Daily  . docusate sodium  200 mg Oral BID  . famotidine  40 mg Oral Daily  . fenofibrate  54 mg Oral Daily  . furosemide  40 mg Intravenous BID  . loratadine  10 mg Oral Daily  . nitroGLYCERIN  0.5 inch Topical Q6H  . polyethylene glycol  17 g Oral Daily  . simvastatin  20 mg Oral QPM  . sodium chloride  3 mL Intravenous Q12H  . spironolactone  25 mg Oral Daily  . warfarin  4 mg Oral ONCE-1800  . Warfarin - Pharmacist Dosing Inpatient   Does not apply q1800   Continuous Infusions:    . milrinone 0.25 mcg/kg/min (06/24/12 1106)   PRN Meds:.sodium chloride, acetaminophen, bisacodyl, nitroGLYCERIN, ondansetron (ZOFRAN) IV, sodium chloride  Antibiotics     Anti-infectives    None       Time Spent in minutes   35   Karley Pho K M.D on 06/25/2012 at 10:20 AM  Between 7am to 7pm - Pager - [phone]  After 7pm go to [email or web address] -  password [email or web address]  And look for the night coverage person covering for me after hours  [facility] Office  [phone]    Subjective:   Tyler Christensen today has, No headache, No chest pain, No abdominal pain - No Nausea, No new weakness tingling or numbness, No Cough - shortness of breath is much better, having regular BMs now.  Objective:   Filed Vitals:   06/24/12 1424 06/24/12 2208 06/25/12 0525 06/25/12 0957  BP: 117/50 98/52 105/83   Pulse: 66 73 97 69  Temp: 97.7 F (36.5 C) 97.5 F (36.4 C) 97 F (36.1 C)   TempSrc: Oral Oral Oral   Resp: 18 18 18    Height:      Weight:   71.759 kg (158 lb 3.2 oz)   SpO2: 100% 100% 93%     Wt Readings from Last 3 Encounters:  06/25/12 71.759 kg (158 lb 3.2 oz)  06/12/12 71.305 kg (157 lb 3.2 oz)     Intake/Output Summary (Last 24 hours) at 06/25/12 1020 Last data filed at 06/25/12 1000  Gross per 24 hour  Intake 970.38 ml  Output   3900 ml  Net -2929.62 ml    Exam Awake Alert, Oriented X 3, No new F.N deficits, Normal affect [place].AT,PERRAL Supple Neck,No JVD, No cervical lymphadenopathy appriciated.  Symmetrical Chest wall movement, Good air movement bilaterally, few bibasilar rales RRR,No Gallops,Rubs or new Murmurs, No Parasternal Heave +ve B.Sounds, Abd Soft, Non tender, No organomegaly appriciated, No rebound - guarding or rigidity. No Cyanosis, Clubbing , 1+ edema, No new Rash or bruise     Data Review   Micro Results No results found for this or any previous visit (from the past 240 hour(s)).  Radiology Reports Dg Chest 2 View  06/22/2012  *RADIOLOGY REPORT*  Clinical Data: Chest pain  CHEST - 2 VIEW  Comparison: 06/11/2012  Findings: Cardiomegaly again noted.  No acute infiltrate or pulmonary edema.  Mild elevation of the right hemidiaphragm. Stable mild degenerative changes thoracic spine.  IMPRESSION: No active disease.  Cardiomegaly again noted.   Original Report Authenticated By: Natasha Mead, M.D.    Dg  Abd Portable 1v  06/22/2012  *RADIOLOGY REPORT*  Clinical Data: Constipation.  PORTABLE ABDOMEN - 1 VIEW  Comparison: 06/11/2012.  Findings: Minimally dilated small bowel loops in the mid abdomen. Normal caliber colon.  Moderate amount of stool in the rectum with smaller amounts of stool elsewhere in the colon.  Lumbar spine degenerative  changes.  IMPRESSION:  1.  Minimal small bowel ileus or partial obstruction. 2.  Moderate amount of stool in the rectum with a lesser amount of stool elsewhere in the colon.   Original Report Authenticated By: Beckie Salts, M.D.     CBC  Lab 06/25/12 0553 06/23/12 0614 06/22/12 1434  WBC 10.2 9.3 10.2  HGB 14.7 13.8 15.3  HCT 43.1 41.4 45.5  PLT 233 234 244  MCV 93.1 92.8 93.4  MCH 31.7 30.9 31.4  MCHC 34.1 33.3 33.6  RDW 14.4 14.3 14.4  LYMPHSABS -- 1.8 --  MONOABS -- 0.8 --  EOSABS -- 0.5 --  BASOSABS -- 0.1 --  BANDABS -- -- --    Chemistries   Lab 06/25/12 0553 06/24/12 1223 06/24/12 0603 06/23/12 0614 06/22/12 1434  NA 139 137 140 142 140  K 3.8 4.2 4.6 4.9 3.7  CL 99 98 99 101 98  CO2 27 27 32 32 29  GLUCOSE 104* 138* 100* 95 112*  BUN 24* 25* 28* 36* 31*  CREATININE 1.12 1.12 1.42* 1.70* 1.31  CALCIUM 8.8 8.8 8.8 8.6 8.9  MG -- -- -- -- --  AST -- -- -- -- 29  ALT -- -- -- -- 42  ALKPHOS -- -- -- -- 174*  BILITOT -- -- -- -- 0.8   ------------------------------------------------------------------------------------------------------------------ estimated creatinine clearance is 53.4 ml/min (by C-G formula based on Cr of 1.12). ------------------------------------------------------------------------------------------------------------------ No results found for this basename: HGBA1C:2 in the last 72 hours ------------------------------------------------------------------------------------------------------------------ No results found for this basename: CHOL:2,HDL:2,LDLCALC:2,TRIG:2,CHOLHDL:2,LDLDIRECT:2 in the last 72  hours ------------------------------------------------------------------------------------------------------------------ No results found for this basename: TSH,T4TOTAL,FREET3,T3FREE,THYROIDAB in the last 72 hours ------------------------------------------------------------------------------------------------------------------ No results found for this basename: VITAMINB12:2,FOLATE:2,FERRITIN:2,TIBC:2,IRON:2,RETICCTPCT:2 in the last 72 hours  Coagulation profile  Lab 06/25/12 0553 06/24/12 0603 06/23/12 0614 06/22/12 1434  INR 2.10* 3.03* 4.24* 2.86*  PROTIME -- -- -- --    No results found for this basename: DDIMER:2 in the last 72 hours  Cardiac Enzymes  Lab 06/23/12 0614 06/23/12 0020 06/22/12 2021  CKMB -- -- --  TROPONINI <0.30 <0.30 <0.30  MYOGLOBIN -- -- --   ------------------------------------------------------------------------------------------------------------------ No components found with this basename: POCBNP:3

## 2012-06-26 DIAGNOSIS — I5023 Acute on chronic systolic (congestive) heart failure: Principal | ICD-10-CM

## 2012-06-26 LAB — BASIC METABOLIC PANEL
CO2: 28 mEq/L (ref 19–32)
Calcium: 8.7 mg/dL (ref 8.4–10.5)
Glucose, Bld: 85 mg/dL (ref 70–99)
Sodium: 138 mEq/L (ref 135–145)

## 2012-06-26 LAB — PROTIME-INR: INR: 1.78 — ABNORMAL HIGH (ref 0.00–1.49)

## 2012-06-26 MED ORDER — WARFARIN SODIUM 5 MG PO TABS
5.0000 mg | ORAL_TABLET | Freq: Once | ORAL | Status: AC
  Administered 2012-06-26: 5 mg via ORAL
  Filled 2012-06-26: qty 1

## 2012-06-26 MED ORDER — FUROSEMIDE 10 MG/ML IJ SOLN
80.0000 mg | Freq: Two times a day (BID) | INTRAMUSCULAR | Status: DC
  Administered 2012-06-26 – 2012-06-27 (×2): 80 mg via INTRAVENOUS
  Filled 2012-06-26 (×3): qty 8

## 2012-06-26 MED ORDER — POTASSIUM CHLORIDE CRYS ER 20 MEQ PO TBCR
40.0000 meq | EXTENDED_RELEASE_TABLET | Freq: Two times a day (BID) | ORAL | Status: AC
  Administered 2012-06-26 – 2012-06-27 (×3): 40 meq via ORAL
  Filled 2012-06-26 (×3): qty 2

## 2012-06-26 MED ORDER — ENALAPRIL MALEATE 2.5 MG PO TABS
2.5000 mg | ORAL_TABLET | Freq: Two times a day (BID) | ORAL | Status: DC
  Administered 2012-06-26 – 2012-06-29 (×7): 2.5 mg via ORAL
  Filled 2012-06-26 (×11): qty 1

## 2012-06-26 NOTE — Progress Notes (Signed)
Advanced Heart Failure Rounding Note  Primary Cardiologist: Brackbill  Subjective:    79 y.o. [name] w/ PMHx significant for Chronic Systolic CHF (EF 40%), NICM (normal coronary anatomy 2005), LBBB, PAF (on coumadin), HTN, HLD, CVA, and Seizure (2/2 CVA 05/2012).  Spanish speaking with some understanding of English.  Admitted with progressive dyspnea and chest pain.   In the ED, CXR was without acute cardiopulmonary findings. Labs were significant for normal poc troponin, pBNP 32,268 (previously 15,163), INR 2.86, dig level 1.2, BUN/Crt 31/1.31, alk phos 174, otherwise unremarkable CBC/CMET.  He says his breathing is worse when laying down, better with sitting.  Increased urine output with IV lasix.  Still with lower extremity edema.  Cr stable.     Objective:   Weight Range:  Vital Signs:   Temp:  [97.6 F (36.4 C)-97.9 F (36.6 C)] 97.8 F (36.6 C) (01/07 0412) Pulse Rate:  [65-69] 69  (01/07 0412) Resp:  [20] 20  (01/07 0412) BP: (96-112)/(42-71) 98/71 mmHg (01/07 0412) SpO2:  [96 %-100 %] 97 % (01/07 0412) Weight:  [70.58 kg (155 lb 9.6 oz)] 70.58 kg (155 lb 9.6 oz) (01/07 0412) Last BM Date: 06/25/12  Weight change: Filed Weights   06/24/12 0512 06/25/12 0525 06/26/12 0412  Weight: 73.528 kg (162 lb 1.6 oz) 71.759 kg (158 lb 3.2 oz) 70.58 kg (155 lb 9.6 oz)    Intake/Output:   Intake/Output Summary (Last 24 hours) at 06/26/12 1001 Last data filed at 06/26/12 0900  Gross per 24 hour  Intake 1379.84 ml  Output   2200 ml  Net -820.16 ml     Physical Exam: General:  Well appearing. No resp difficulty.  Spanish speaking  HEENT: normal Neck: supple. JVP jaw . Carotids 2+ bilat; no bruits. No lymphadenopathy or thryomegaly appreciated. Cor: PMI laterally displaced. Irregular rate & rhythm. +s3 Lungs: clear Abdomen: soft, nontender, mildly distended. No hepatosplenomegaly. No bruits or masses. Good bowel sounds. Extremities: no cyanosis, clubbing, rash, 2+  edema Neuro: alert & orientedx3, cranial nerves grossly intact. moves all 4 extremities w/o difficulty. Affect pleasant  Telemetry: AF 50s with PVCs  Labs: Basic Metabolic Panel:  Lab 06/26/12 9811 06/25/12 0553 06/24/12 1223 06/24/12 0603 06/23/12 0614  NA 138 139 137 140 142  K 3.3* 3.8 4.2 4.6 4.9  CL 95* 99 98 99 101  CO2 28 27 27  32 32  GLUCOSE 85 104* 138* 100* 95  BUN 29* 24* 25* 28* 36*  CREATININE 1.14 1.12 1.12 1.42* 1.70*  CALCIUM 8.7 8.8 8.8 -- --  MG 1.8 -- -- -- --  PHOS -- -- -- -- --    Liver Function Tests:  Lab 06/22/12 1434  AST 29  ALT 42  ALKPHOS 174*  BILITOT 0.8  PROT 6.6  ALBUMIN 3.4*   No results found for this basename: LIPASE:5,AMYLASE:5 in the last 168 hours No results found for this basename: AMMONIA:3 in the last 168 hours  CBC:  Lab 06/25/12 0553 06/23/12 0614 06/22/12 1434  WBC 10.2 9.3 10.2  NEUTROABS -- 6.1 --  HGB 14.7 13.8 15.3  HCT 43.1 41.4 45.5  MCV 93.1 92.8 93.4  PLT 233 234 244    Cardiac Enzymes:  Lab 06/23/12 0614 06/23/12 0020 06/22/12 2021  CKTOTAL -- -- --  CKMB -- -- --  CKMBINDEX -- -- --  TROPONINI <0.30 <0.30 <0.30    BNP: BNP (last 3 results)  Basename 06/22/12 1434 06/11/12 0401 04/04/12 1245  PROBNP 32268.0* 15163.0* 9002.0*  Other results:    Imaging: No results found.   Medications:     Scheduled Medications:    . bisacodyl  10 mg Rectal Once  . carvedilol  6.25 mg Oral BID WC  . digoxin  0.0625 mg Oral Daily  . docusate sodium  200 mg Oral BID  . famotidine  40 mg Oral Daily  . fenofibrate  54 mg Oral Daily  . furosemide  40 mg Intravenous BID  . loratadine  10 mg Oral Daily  . nitroGLYCERIN  0.5 inch Topical Q6H  . polyethylene glycol  17 g Oral Daily  . potassium chloride  40 mEq Oral BID  . simvastatin  20 mg Oral QPM  . sodium chloride  3 mL Intravenous Q12H  . spironolactone  25 mg Oral Daily  . Warfarin - Pharmacist Dosing Inpatient   Does not apply q1800     Infusions:    . milrinone 0.25 mcg/kg/min (06/25/12 1854)    PRN Medications: sodium chloride, acetaminophen, bisacodyl, nitroGLYCERIN, ondansetron (ZOFRAN) IV, sodium chloride   Assessment:   1. Acute on chronic systolic CHF  2. Atrial fibrillation, now with a controlled VR    --was in SR in October 2013  3. H/o stroke  4. HTN  5. LBBB 6. A/C renal failure, improved with milrinone 7. Hypokalemia  Plan/Discussion:    He is beginning to diurese with IV milrinone. Renal function improving. Still with probably 10 pounds to go. Will continue milrinone. Increase lasix to 80 bid. Start low-dose ACE-I - enalapril 2.5 bid. Supp K+.   Overall, clearly has advanced HF. Which may be exacerbated by his AF. I discussed with Dr. Ladona Ridgel and suspect he may have a hard time maintaining SR but given that he was in NSR on last admit will try TEE and DC-CV (+/- amio as HR tolerates) once we can get him anti-coagulated. Once optimized will wean milrinone and consider RHC. Agree with consideration of BIVICD as outpatient.  Will have interpreter come tomorrow to help [name] work through all the issues. Will need extensive social services to try to facilitate outpatient management.   Nisha Dhami,MD 10:08 AM       Length of Stay: 4

## 2012-06-26 NOTE — Progress Notes (Signed)
PT Cancellation Note  Patient Details Name: Tyler Christensen MRN: [id number] DOB: Jan 21, 1933   Cancelled Treatment:    Reason Eval/Treat Not Completed: Medical issues which prohibited therapy. Potassium 3.3. Hopeful to complete eval this afternoon once K+ given.  [facility] HELEN 06/26/2012, 10:59 AM Pager: [phone]

## 2012-06-26 NOTE — Progress Notes (Signed)
ANTICOAGULATION CONSULT NOTE - Follow Up Consult  Pharmacy Consult for Coumadin Indication: atrial fibrillation / history of CVA  No Known Allergies  Patient Measurements: Height: 5\' 8"  (172.7 cm) Weight: 155 lb 9.6 oz (70.58 kg) (b) IBW/kg (Calculated) : 68.4   Vital Signs: Temp: 97.8 F (36.6 C) (01/07 0412) Temp src: Oral (01/07 0412) BP: 106/66 mmHg (01/07 1019) Pulse Rate: 66  (01/07 1019)  Labs:  Basename 06/26/12 0620 06/25/12 0553 06/24/12 1223 06/24/12 0603  HGB -- 14.7 -- --  HCT -- 43.1 -- --  PLT -- 233 -- --  APTT -- -- -- --  LABPROT 20.1* 22.7* -- 29.8*  INR 1.78* 2.10* -- 3.03*  HEPARINUNFRC -- -- -- --  CREATININE 1.14 1.12 1.12 --  CKTOTAL -- -- -- --  CKMB -- -- -- --  TROPONINI -- -- -- --    Estimated Creatinine Clearance: 52.5 ml/min (by C-G formula based on Cr of 1.14).  Assessment: 79yo male continues on coumadin for Afib / history of CVA. Home dose is reported to be 4mg  daily.  INR slightly sub-therapuetic today due to held does over the weekend when INR was supra-therapuetic. CBC is stable. No bleeding reported in chart.  Goal of Therapy:  INR 2-3 Monitor platelets by anticoagulation protocol: Yes   Plan:  1) Coumadin 5 mg x 1 tonight 2) Follow up INR in AM  Benjaman Pott, PharmD    06/26/2012   1:22 PM

## 2012-06-26 NOTE — Evaluation (Signed)
Physical Therapy Evaluation Patient Details Name: Tyler Christensen MRN: [id number] DOB: Jun 16, 1933 Today's Date: 06/26/2012 Time: 9629-5284 PT Time Calculation (min): 22 min  PT Assessment / Plan / Recommendation Clinical Impression  79 yo with several recent hospitalizations--most recent due to acute heart failure (EF15-20%). Pt mobilizing independently with VSS. No skilled PT needs identified and pt in agreement.    PT Assessment  Patent does not need any further PT services    Follow Up Recommendations  No PT follow up                Equipment Recommendations  None recommended by PT               Precautions / Restrictions Precautions Precautions: None   Pertinent Vitals/Pain HR 60-67 (rest & with walking) SaO2 97% on RA throughout session Denied dizziness or pain      Mobility  Bed Mobility Bed Mobility: Supine to Sit Supine to Sit: 6: Modified independent (Device/Increase time) Details for Bed Mobility Assistance: slight incr effort/time Transfers Transfers: Sit to Stand;Stand to Sit Sit to Stand: 7: Independent;Without upper extremity assist;From bed;From chair/3-in-1 Stand to Sit: 7: Independent;With upper extremity assist;With armrests;To chair/3-in-1 Details for Transfer Assistance: x 2 no imbalance or dizziness Ambulation/Gait Ambulation/Gait Assistance: 7: Independent Ambulation Distance (Feet): 400 Feet Assistive device: None Ambulation/Gait Assistance Details: Pt able to safely push IV pole himself and can ambulate without holding onto anything with no imbalance. Dyspne 2/4, howvever pt talking fairly constantly while walking. Gait Pattern: Within Functional Limits                   Visit Information  Last PT Received On: 06/26/12 Assistance Needed: +1    Subjective Data  Subjective: States (in [place]) that he is breathing much better. States he will meet with doctors and interpreter tomorrow morning re: pacemaker. Patient Stated Goal: to get  heart stronger   Prior Functioning  Home Living Lives With: Spouse Available Help at Discharge: Family;Available PRN/intermittently Type of Home: House Home Access: Level entry Home Layout: One level Bathroom Shower/Tub: Health visitor: Standard Home Adaptive Equipment: None Additional Comments: Reports he has no problems with balance. Just gets short of breath Prior Function Level of Independence: Independent Able to Take Stairs?: Yes Communication Communication: Prefers language other than English (prefers [place]; speaks very little [place]) Dominant Hand: Right    Cognition  Overall Cognitive Status: Appears within functional limits for tasks assessed/performed Arousal/Alertness: Awake/alert Orientation Level: Appears intact for tasks assessed Behavior During Session: [facility] for tasks performed    Extremity/Trunk Assessment Right Upper Extremity Assessment RUE ROM/Strength/Tone: [facility] for tasks assessed Left Upper Extremity Assessment LUE ROM/Strength/Tone: WFL for tasks assessed Right Lower Extremity Assessment RLE ROM/Strength/Tone: [facility] for tasks assessed Left Lower Extremity Assessment LLE ROM/Strength/Tone: [facility] for tasks assessed Trunk Assessment Trunk Assessment: Normal   Balance Balance Balance Assessed: Yes Dynamic Standing Balance Dynamic Standing - Balance Support: No upper extremity supported;During functional activity Dynamic Standing - Level of Assistance: 7: Independent Dynamic Standing - Balance Activities: Lateral lean/weight shifting;Forward lean/weight shifting;Reaching for objects;Reaching across midline;Other (comment) (stepping backwards) High Level Balance High Level Balance Activites: Backward walking;Turns;Sudden stops  End of Session PT - End of Session Equipment Utilized During Treatment: Gait belt Activity Tolerance: Patient tolerated treatment well Patient left: in chair;with call bell/phone within reach;with nursing in  room Nurse Communication: Mobility status;Other (comment) (no PT needs)  GP     Tyler Christensen 06/26/2012, 3:23 PM  Pager [phone]

## 2012-06-26 NOTE — Progress Notes (Signed)
[facility]                                                                                Patient Demographics  Tyler Christensen, is a 79 y.o. male  GNF:[id number]  QIO:[id number]  DOB - 1933/04/10  Admit date - 06/22/2012  Admitting Physician Leroy Sea, MD  Outpatient Primary MD for the patient is Jearld Lesch, MD  LOS - 4   Chief Complaint  Patient presents with  . Chest Pain        Assessment & Plan    Service is being transferred to cardiology. Hospitalist service will sign off. We appreciate cardiology's help.    79 year old Hispanic male with severe chronic systolic heart failure with EF of 20% who has had several admissions for acute decompensation was admitted for another episode of acute on chronic systolic heart failure, patient runs low blood pressure which prevents him from being on ACE/ARB, he also developed acute renal failure hence ACE/ARB was not given, he is requiring IV milrinone to augment his cardiac output and to stabilize his low blood pressures, on IV milrinone he is able to tolerate IV Lasix and is diuresing well, but he ologies following him closely.     1. Shortness of breath and orthopnea along with mild left-sided chest tightness in a patient with history of chronic systolic CHF with EF 20% in December of 2013. Presented with 2+ leg edema, elevated proBNP. All due to acute on chronic systolic CHF - continue Lasix as tolerated by blood pressure, blood pressure cannot tolerate ACE/ARB and has been discontinued, cardiology seeing the patient, Coreg to be continued with parameters to hold, Has been started on IV milrinone to Augment cardiac output and to relieve his heart failure symptoms. Is definitely Clinically improved. Cardiology evaluating him for biventricular pacemaker placement.  ACE inhibitor/ARB not given due to acute renal insufficiency along with extremely low blood pressures.   2. Paroxysmal atrial fibrillation and  left bundle branch block.- No acute issues: Goal will be rate control, Coreg will be continued as tolerated by his blood pressure, digoxin dose adjusted, pharmacy will monitor Coumadin.   Lab Results  Component Value Date   INR 1.78* 06/26/2012   INR 2.10* 06/25/2012   INR 3.03* 06/24/2012     3. 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 No acute issues no new focal logical deficits, continue Coumadin as above.     4. Acute renal failure due to combination of severe systolic heart failure, low cardiac output and like he ACE inhibitor. Discontinued ACE inhibitor, milrinone as above and monitor BMP serially, Renal function improving.    5. Mild ileus, bowel regimen initiated, much better after was given 1 L of GoLYTELY, had 5 BMs between 1/5 and 06/25/2012.     Code Status: Will  Family Communication: With the patient with the help of phone interpreter  Disposition Plan: Home   Procedures    Consults cardiology   DVT Prophylaxis  heparin  Lab Results  Component Value Date   PLT 233 06/25/2012    Medications  Scheduled Meds:    . bisacodyl  10 mg Rectal Once  .  carvedilol  6.25 mg Oral BID WC  . digoxin  0.0625 mg Oral Daily  . docusate sodium  200 mg Oral BID  . enalapril  2.5 mg Oral BID  . famotidine  40 mg Oral Daily  . fenofibrate  54 mg Oral Daily  . furosemide  80 mg Intravenous BID  . loratadine  10 mg Oral Daily  . nitroGLYCERIN  0.5 inch Topical Q6H  . polyethylene glycol  17 g Oral Daily  . potassium chloride  40 mEq Oral BID  . simvastatin  20 mg Oral QPM  . sodium chloride  3 mL Intravenous Q12H  . spironolactone  25 mg Oral Daily  . Warfarin - Pharmacist Dosing Inpatient   Does not apply q1800   Continuous Infusions:    . milrinone 0.25 mcg/kg/min (06/25/12 1854)   PRN Meds:.sodium chloride, acetaminophen, bisacodyl, nitroGLYCERIN, ondansetron (ZOFRAN) IV, sodium chloride  Antibiotics     Anti-infectives    None        Time Spent in minutes   35   SINGH,PRASHANT K M.D on 06/26/2012 at 10:40 AM  Between 7am to 7pm - Pager - [phone]  After 7pm go to [email or web address] - password [email or web address]  And look for the night coverage person covering for me after hours  [facility] Office  [phone]    Subjective:   Tyler Christensen today has, No headache, No chest pain, No abdominal pain - No Nausea, No new weakness tingling or numbness, No Cough - shortness of breath is much better, having regular BMs now.  Objective:   Filed Vitals:   06/25/12 1429 06/25/12 2244 06/26/12 0412 06/26/12 1019  BP: 112/58 96/42 98/71  106/66  Pulse: 66 65 69 66  Temp: 97.9 F (36.6 C) 97.6 F (36.4 C) 97.8 F (36.6 C)   TempSrc: Oral Oral Oral   Resp: 20 20 20 16   Height:      Weight:   70.58 kg (155 lb 9.6 oz)   SpO2: 100% 96% 97% 100%    Wt Readings from Last 3 Encounters:  06/26/12 70.58 kg (155 lb 9.6 oz)  06/12/12 71.305 kg (157 lb 3.2 oz)     Intake/Output Summary (Last 24 hours) at 06/26/12 1040 Last data filed at 06/26/12 1020  Gross per 24 hour  Intake 1456.34 ml  Output   1750 ml  Net -293.66 ml    Exam Awake Alert, Oriented X 3, No new F.N deficits, Normal affect [place].AT,PERRAL Supple Neck,No JVD, No cervical lymphadenopathy appriciated.  Symmetrical Chest wall movement, Good air movement bilaterally, few bibasilar rales RRR,No Gallops,Rubs or new Murmurs, No Parasternal Heave +ve B.Sounds, Abd Soft, Non tender, No organomegaly appriciated, No rebound - guarding or rigidity. No Cyanosis, Clubbing , 1+ edema, No new Rash or bruise     Data Review   Micro Results No results found for this or any previous visit (from the past 240 hour(s)).  Radiology Reports Dg Chest 2 View  06/22/2012  *RADIOLOGY REPORT*  Clinical Data: Chest pain  CHEST - 2 VIEW  Comparison: 06/11/2012  Findings: Cardiomegaly again noted.  No acute infiltrate or pulmonary edema.  Mild elevation of the right  hemidiaphragm. Stable mild degenerative changes thoracic spine.  IMPRESSION: No active disease.  Cardiomegaly again noted.   Original Report Authenticated By: Natasha Mead, M.D.    Dg Abd Portable 1v  06/22/2012  *RADIOLOGY REPORT*  Clinical Data: Constipation.  PORTABLE ABDOMEN - 1 VIEW  Comparison: 06/11/2012.  Findings: Minimally dilated small bowel  loops in the mid abdomen. Normal caliber colon.  Moderate amount of stool in the rectum with smaller amounts of stool elsewhere in the colon.  Lumbar spine degenerative changes.  IMPRESSION:  1.  Minimal small bowel ileus or partial obstruction. 2.  Moderate amount of stool in the rectum with a lesser amount of stool elsewhere in the colon.   Original Report Authenticated By: Beckie Salts, M.D.     CBC  Lab 06/25/12 0553 06/23/12 0614 06/22/12 1434  WBC 10.2 9.3 10.2  HGB 14.7 13.8 15.3  HCT 43.1 41.4 45.5  PLT 233 234 244  MCV 93.1 92.8 93.4  MCH 31.7 30.9 31.4  MCHC 34.1 33.3 33.6  RDW 14.4 14.3 14.4  LYMPHSABS -- 1.8 --  MONOABS -- 0.8 --  EOSABS -- 0.5 --  BASOSABS -- 0.1 --  BANDABS -- -- --    Chemistries   Lab 06/26/12 0620 06/25/12 0553 06/24/12 1223 06/24/12 0603 06/23/12 0614 06/22/12 1434  NA 138 139 137 140 142 --  K 3.3* 3.8 4.2 4.6 4.9 --  CL 95* 99 98 99 101 --  CO2 28 27 27  32 32 --  GLUCOSE 85 104* 138* 100* 95 --  BUN 29* 24* 25* 28* 36* --  CREATININE 1.14 1.12 1.12 1.42* 1.70* --  CALCIUM 8.7 8.8 8.8 8.8 8.6 --  MG 1.8 -- -- -- -- --  AST -- -- -- -- -- 29  ALT -- -- -- -- -- 42  ALKPHOS -- -- -- -- -- 174*  BILITOT -- -- -- -- -- 0.8   ------------------------------------------------------------------------------------------------------------------ estimated creatinine clearance is 52.5 ml/min (by C-G formula based on Cr of 1.14). ------------------------------------------------------------------------------------------------------------------ No results found for this basename: HGBA1C:2 in the last 72  hours ------------------------------------------------------------------------------------------------------------------ No results found for this basename: CHOL:2,HDL:2,LDLCALC:2,TRIG:2,CHOLHDL:2,LDLDIRECT:2 in the last 72 hours ------------------------------------------------------------------------------------------------------------------ No results found for this basename: TSH,T4TOTAL,FREET3,T3FREE,THYROIDAB in the last 72 hours ------------------------------------------------------------------------------------------------------------------ No results found for this basename: VITAMINB12:2,FOLATE:2,FERRITIN:2,TIBC:2,IRON:2,RETICCTPCT:2 in the last 72 hours  Coagulation profile  Lab 06/26/12 0620 06/25/12 0553 06/24/12 0603 06/23/12 0614 06/22/12 1434  INR 1.78* 2.10* 3.03* 4.24* 2.86*  PROTIME -- -- -- -- --    No results found for this basename: DDIMER:2 in the last 72 hours  Cardiac Enzymes  Lab 06/23/12 0614 06/23/12 0020 06/22/12 2021  CKMB -- -- --  TROPONINI <0.30 <0.30 <0.30  MYOGLOBIN -- -- --   ------------------------------------------------------------------------------------------------------------------ No components found with this basename: POCBNP:3

## 2012-06-27 DIAGNOSIS — I447 Left bundle-branch block, unspecified: Secondary | ICD-10-CM

## 2012-06-27 DIAGNOSIS — N179 Acute kidney failure, unspecified: Secondary | ICD-10-CM

## 2012-06-27 LAB — BASIC METABOLIC PANEL
BUN: 28 mg/dL — ABNORMAL HIGH (ref 6–23)
Chloride: 98 mEq/L (ref 96–112)
GFR calc Af Amer: 68 mL/min — ABNORMAL LOW (ref >90)
Potassium: 4.1 mEq/L (ref 3.5–5.1)

## 2012-06-27 LAB — PROTIME-INR
INR: 2.06 — ABNORMAL HIGH (ref 0.00–1.49)
Prothrombin Time: 22.4 seconds — ABNORMAL HIGH (ref 11.6–15.2)

## 2012-06-27 MED ORDER — AMIODARONE HCL IN DEXTROSE 360-4.14 MG/200ML-% IV SOLN
30.0000 mg/h | INTRAVENOUS | Status: DC
  Administered 2012-06-28 – 2012-06-29 (×2): 30 mg/h via INTRAVENOUS
  Filled 2012-06-27 (×8): qty 200

## 2012-06-27 MED ORDER — FUROSEMIDE 10 MG/ML IJ SOLN
80.0000 mg | Freq: Three times a day (TID) | INTRAMUSCULAR | Status: DC
  Administered 2012-06-27 – 2012-06-28 (×3): 80 mg via INTRAVENOUS
  Filled 2012-06-27 (×3): qty 8

## 2012-06-27 MED ORDER — AMIODARONE LOAD VIA INFUSION
150.0000 mg | Freq: Once | INTRAVENOUS | Status: AC
  Administered 2012-06-27: 150 mg via INTRAVENOUS
  Filled 2012-06-27: qty 83.34

## 2012-06-27 MED ORDER — CARVEDILOL 3.125 MG PO TABS
3.1250 mg | ORAL_TABLET | Freq: Two times a day (BID) | ORAL | Status: DC
  Administered 2012-06-27 – 2012-07-10 (×26): 3.125 mg via ORAL
  Filled 2012-06-27 (×30): qty 1

## 2012-06-27 MED ORDER — RIVAROXABAN 20 MG PO TABS
20.0000 mg | ORAL_TABLET | Freq: Every day | ORAL | Status: DC
  Administered 2012-06-27: 20 mg via ORAL
  Filled 2012-06-27 (×2): qty 1

## 2012-06-27 MED ORDER — METOLAZONE 2.5 MG PO TABS
2.5000 mg | ORAL_TABLET | Freq: Two times a day (BID) | ORAL | Status: DC
  Administered 2012-06-27: 2.5 mg via ORAL
  Filled 2012-06-27 (×3): qty 1

## 2012-06-27 MED ORDER — AMIODARONE HCL IN DEXTROSE 360-4.14 MG/200ML-% IV SOLN
60.0000 mg/h | INTRAVENOUS | Status: AC
  Administered 2012-06-27 (×2): 60 mg/h via INTRAVENOUS
  Filled 2012-06-27 (×3): qty 200

## 2012-06-27 MED ORDER — WARFARIN SODIUM 4 MG PO TABS
4.0000 mg | ORAL_TABLET | Freq: Every day | ORAL | Status: DC
  Filled 2012-06-27: qty 1

## 2012-06-27 NOTE — Progress Notes (Signed)
Advanced Heart Failure Rounding Note  Primary Cardiologist: Brackbill  Subjective:    79 y.o. [name] w/ PMHx significant for Chronic Systolic CHF (EF 16%), NICM (normal coronary anatomy 2005), LBBB, PAF (on coumadin), HTN, HLD, CVA, and Seizure (2/2 CVA 05/2012).  Spanish speaking with some understanding of English.  Admitted with progressive dyspnea and chest pain.   In the ED, CXR was without acute cardiopulmonary findings. Labs were significant for normal poc troponin, pBNP 32,268 (previously 15,163), INR 2.86, dig level 1.2, BUN/Crt 31/1.31, alk phos 174, otherwise unremarkable CBC/CMET.  Discussed through help of interpreter:  He continues to feel better.  Had 2 episodes of PND last night.  Ambulated in the hall but had to stop 3 times.  Edema improving.    Objective:   Weight Range:  Vital Signs:   Temp:  [97 F (36.1 C)-98.1 F (36.7 C)] 97 F (36.1 C) (01/08 1425) Pulse Rate:  [62-77] 66  (01/08 1425) Resp:  [18] 18  (01/08 1425) BP: (105-124)/(52-62) 105/52 mmHg (01/08 1425) SpO2:  [94 %-96 %] 96 % (01/08 1425) Weight:  [70.2 kg (154 lb 12.2 oz)] 70.2 kg (154 lb 12.2 oz) (01/08 0545) Last BM Date: 06/26/12  Weight change: Filed Weights   06/25/12 0525 06/26/12 0412 06/27/12 0545  Weight: 71.759 kg (158 lb 3.2 oz) 70.58 kg (155 lb 9.6 oz) 70.2 kg (154 lb 12.2 oz)    Intake/Output:   Intake/Output Summary (Last 24 hours) at 06/27/12 1540 Last data filed at 06/27/12 1300  Gross per 24 hour  Intake   1179 ml  Output   3200 ml  Net  -2021 ml     Physical Exam: General:  Well appearing. No resp difficulty.  Spanish speaking  HEENT: normal Neck: supple. JVP jaw. Carotids 2+ bilat; no bruits. No lymphadenopathy or thryomegaly appreciated. Cor: PMI nondisplaced. Irregular irregular.  No rubs, gallops or murmurs. Lungs: clear Abdomen: soft, nontender, + distended. No hepatosplenomegaly. No bruits or masses. Good bowel sounds. Extremities: no cyanosis, clubbing,  rash, 2+ edema  L>R TED hose in place Neuro: alert & orientedx3, cranial nerves grossly intact. moves all 4 extremities w/o difficulty. Affect pleasant  Telemetry: afib 60s  Labs: Basic Metabolic Panel:  Lab 06/27/12 1096 06/26/12 0620 06/25/12 0553 06/24/12 1223 06/24/12 0603  NA 138 138 139 137 140  K 4.1 3.3* 3.8 4.2 4.6  CL 98 95* 99 98 99  CO2 30 28 27 27  32  GLUCOSE 106* 85 104* 138* 100*  BUN 28* 29* 24* 25* 28*  CREATININE 1.17 1.14 1.12 1.12 1.42*  CALCIUM 8.8 8.7 8.8 -- --  MG -- 1.8 -- -- --  PHOS -- -- -- -- --    Liver Function Tests:  Lab 06/22/12 1434  AST 29  ALT 42  ALKPHOS 174*  BILITOT 0.8  PROT 6.6  ALBUMIN 3.4*   No results found for this basename: LIPASE:5,AMYLASE:5 in the last 168 hours No results found for this basename: AMMONIA:3 in the last 168 hours  CBC:  Lab 06/25/12 0553 06/23/12 0614 06/22/12 1434  WBC 10.2 9.3 10.2  NEUTROABS -- 6.1 --  HGB 14.7 13.8 15.3  HCT 43.1 41.4 45.5  MCV 93.1 92.8 93.4  PLT 233 234 244    Cardiac Enzymes:  Lab 06/23/12 0614 06/23/12 0020 06/22/12 2021  CKTOTAL -- -- --  CKMB -- -- --  CKMBINDEX -- -- --  TROPONINI <0.30 <0.30 <0.30    BNP: BNP (last 3 results)  Alvira Philips  06/22/12 1434 06/11/12 0401 04/04/12 1245  PROBNP 32268.0* 15163.0* 9002.0*        Imaging: No results found.   Medications:     Scheduled Medications:    . bisacodyl  10 mg Rectal Once  . carvedilol  6.25 mg Oral BID WC  . digoxin  0.0625 mg Oral Daily  . docusate sodium  200 mg Oral BID  . enalapril  2.5 mg Oral BID  . famotidine  40 mg Oral Daily  . fenofibrate  54 mg Oral Daily  . furosemide  80 mg Intravenous BID  . loratadine  10 mg Oral Daily  . nitroGLYCERIN  0.5 inch Topical Q6H  . polyethylene glycol  17 g Oral Daily  . simvastatin  20 mg Oral QPM  . sodium chloride  3 mL Intravenous Q12H  . spironolactone  25 mg Oral Daily  . warfarin  4 mg Oral q1800  . Warfarin - Pharmacist Dosing Inpatient    Does not apply q1800    Infusions:    . milrinone 0.249 mcg/kg/min (06/26/12 1900)    PRN Medications: sodium chloride, acetaminophen, bisacodyl, nitroGLYCERIN, ondansetron (ZOFRAN) IV, sodium chloride   Assessment:   1. Acute on chronic systolic CHF  2. Atrial fibrillation, now with a controlled VR  3. H/o stroke  4. HTN  5. LBBB  Plan/Discussion:    Feels better on milrinone but still with significant volume overload and diuresing slowly. Will add metolazone. May also need to increase lasix to tid. I worry that his output is still low.  Extensive discussions with him and his wife today through interpreter about situation and course of treatment. He has very good insight into his disease and medicines and said he was on coumadin for 4 years in [place] without problem.   We will plan to start Xarelto today and load amiodarone. Plan TEE/DC-CV on Friday. Once adequately diuresed will then wean milrinone and perform RHC. I suspect he may need home inotropes. Ideally would also evaluate for development of CAD (last cath 2005 but once he has DC-CV cannot stop Xarelto so may have to go with cardiac CT instead of cath). Will also plan BiVICD as outpatient.  We briefly discussed VAD therapy as well.    Total encounter approximately 90 minutes with over 50 minutes spent in direct discussions.   Length of Stay: 5 Cornelius Schuitema,MD 3:48 PM

## 2012-06-27 NOTE — Progress Notes (Signed)
ANTICOAGULATION CONSULT NOTE - Follow Up Consult  Pharmacy Consult for Coumadin Indication: atrial fibrillation / history of CVA  No Known Allergies  Patient Measurements: Height: 5\' 8"  (172.7 cm) Weight: 154 lb 12.2 oz (70.2 kg) IBW/kg (Calculated) : 68.4   Vital Signs: Temp: 98.1 F (36.7 C) (01/08 0545) Temp src: Oral (01/08 0545) BP: 105/59 mmHg (01/08 1017) Pulse Rate: 62  (01/08 1016)  Labs:  Basename 06/27/12 0435 06/26/12 0620 06/25/12 0553  HGB -- -- 14.7  HCT -- -- 43.1  PLT -- -- 233  APTT -- -- --  LABPROT 22.4* 20.1* 22.7*  INR 2.06* 1.78* 2.10*  HEPARINUNFRC -- -- --  CREATININE 1.17 1.14 1.12  CKTOTAL -- -- --  CKMB -- -- --  TROPONINI -- -- --    Estimated Creatinine Clearance: 51.2 ml/min (by C-G formula based on Cr of 1.17).  Assessment: 79yo male continues on coumadin for Afib / history of CVA. Home dose is reported to be 4mg  daily.  INR at goal today. CBC is stable. No bleeding reported in chart.  Goal of Therapy:  INR 2-3 Monitor platelets by anticoagulation protocol: Yes   Plan:  1) Coumadin 4 mg daily 2) Follow up INR in AM  Sheppard Coil, PharmD    06/27/2012   12:05 PM

## 2012-06-28 DIAGNOSIS — N179 Acute kidney failure, unspecified: Secondary | ICD-10-CM

## 2012-06-28 LAB — BASIC METABOLIC PANEL
CO2: 27 mEq/L (ref 19–32)
Calcium: 9.5 mg/dL (ref 8.4–10.5)
Chloride: 95 mEq/L — ABNORMAL LOW (ref 96–112)
GFR calc Af Amer: 49 mL/min — ABNORMAL LOW (ref >90)
GFR calc non Af Amer: 42 mL/min — ABNORMAL LOW (ref >90)
GFR calc non Af Amer: 43 mL/min — ABNORMAL LOW (ref >90)
Glucose, Bld: 141 mg/dL — ABNORMAL HIGH (ref 70–99)
Potassium: 4.1 mEq/L (ref 3.5–5.1)
Potassium: 4.2 mEq/L (ref 3.5–5.1)
Sodium: 136 mEq/L (ref 135–145)
Sodium: 134 mEq/L — ABNORMAL LOW (ref 135–145)

## 2012-06-28 LAB — PROTIME-INR: INR: 5.94 (ref 0.00–1.49)

## 2012-06-28 MED ORDER — RIVAROXABAN 15 MG PO TABS
15.0000 mg | ORAL_TABLET | Freq: Every day | ORAL | Status: AC
  Administered 2012-06-28 – 2012-06-29 (×2): 15 mg via ORAL
  Filled 2012-06-28 (×2): qty 1

## 2012-06-28 MED ORDER — RIVAROXABAN 15 MG PO TABS
15.0000 mg | ORAL_TABLET | Freq: Every day | ORAL | Status: DC
  Filled 2012-06-28: qty 1

## 2012-06-28 MED ORDER — RIVAROXABAN 15 MG PO TABS
15.0000 mg | ORAL_TABLET | Freq: Every day | ORAL | Status: DC
  Administered 2012-06-30: 15 mg via ORAL
  Filled 2012-06-28 (×2): qty 1

## 2012-06-28 MED ORDER — FUROSEMIDE 10 MG/ML IJ SOLN
80.0000 mg | Freq: Two times a day (BID) | INTRAMUSCULAR | Status: DC
  Administered 2012-06-28: 80 mg via INTRAVENOUS
  Filled 2012-06-28 (×2): qty 8

## 2012-06-28 NOTE — Progress Notes (Signed)
CRITICAL VALUE ALERT  Critical value received: pt;49 inr; 5.96  Date of notification: 06/28/12  Time of notification:0918 am  Critical value read back:yes  Nurse who received alert: a Shyler Hamill,rn  MD notified (1st page Dr. reddy  Time of first page: 0919  MD notified (2nd page):Nikki Francina Ames and  [facility]  Time of second ZOXW:[phone]  Responding MD:   Lowella Bandy .PA  Time MD [phone]

## 2012-06-28 NOTE — Care Management Note (Addendum)
    Page 1 of 2   07/09/2012     9:24:00 AM   CARE MANAGEMENT NOTE 07/09/2012  Patient:  Tyler Christensen, Tyler Christensen   Account Number:  [id number]  Date Initiated:  06/28/2012  Documentation initiated by:  Tera Mater  Subjective/Objective Assessment:   79YO MALE ADMITTED WITH CHF.  PT. SPEAKS LITTLE ENGLISH     Action/Plan:   DISCHARGE PLANNING   Anticipated DC Date:     Anticipated DC Plan:  HOME W HOME HEALTH SERVICES      DC Planning Services  CM consult      [facility] Choice  DURABLE MEDICAL EQUIPMENT   Choice offered to / List presented to:     DME arranged  VEST - LIFE VEST  IV PUMP/EQUIPMENT      DME agency  OTHER - SEE NOTE  [facility]     [facility] arranged  HH-1 RN  HH-10 DISEASE MANAGEMENT      HH agency  [facility]   Status of service:  Completed, signed off Medicare Important Message given?   (If response is "NO", the following Medicare IM given date fields will be blank) Date Medicare IM given:   Date Additional Medicare IM given:    Discharge Disposition:  HOME W HOME HEALTH SERVICES  Per UR Regulation:  Reviewed for med. necessity/level of care/duration of stay  If discussed at Long Length of Stay Meetings, dates discussed:   07/05/2012    Comments:  1/20 0922 debbie Amairani Shuey rn,bsn [facility] on medicaid preferred list for pul htn. [facility] for home milrinone. lifevesthas been fitted.  1/16 1000 debbie Othniel Maret rn,bsn order for lifevest. have placed lifevest order form on given to md for  sign. have alerted asheley lifevest rep w [employer] of order.  06/28/12 1500 PT. TO HAVE TEE TOMORROW AFTERNOON.  IN ADDITION, WILL HAVE CARDIAC CATHETERIZATION ON MONDAY. CURRENTLY ON IV MILRINONE GTT, AND MAY DC HOME ON GTT.  TC TO DONNA, WITH [facility] TO MAKE AWARE OF POSSIBLE IV HOME MEDICATION.  NCM TO FOLLOW FOR DC NEEDS. Tera Mater, RN, BSN NCM [phone]

## 2012-06-28 NOTE — Progress Notes (Signed)
[facility]  Patient Status: New  [facility] is providing the following services: RN and IV Milrinone if needed at home.  If patient discharges after hours, please call [phone].   Kizzie Furnish 06/28/2012, 12:47 PM

## 2012-06-28 NOTE — Progress Notes (Signed)
ANTICOAGULATION CONSULT NOTE - Initial Consult  Pharmacy Consult for transition from coumadin to xarelto Indication: atrial fibrillation  No Known Allergies  Patient Measurements: Height: 5\' 8"  (172.7 cm) Weight: 154 lb 5.2 oz (70 kg) IBW/kg (Calculated) : 68.4    Vital Signs: Temp: 98.1 F (36.7 C) (01/09 0548) Temp src: Oral (01/09 0548) BP: 106/58 mmHg (01/09 0548) Pulse Rate: 69  (01/09 0548)  Labs:  Basename 06/28/12 0754 06/28/12 0500 06/27/12 0435 06/26/12 0620  HGB -- -- -- --  HCT -- -- -- --  PLT -- -- -- --  APTT -- -- -- --  LABPROT 49.0* -- 22.4* 20.1*  INR 5.94* -- 2.06* 1.78*  HEPARINUNFRC -- -- -- --  CREATININE -- 1.52* 1.17 1.14  CKTOTAL -- -- -- --  CKMB -- -- -- --  TROPONINI -- -- -- --    Estimated Creatinine Clearance: 39.4 ml/min (by C-G formula based on Cr of 1.52).   Medical History: Past Medical History  Diagnosis Date  . HTN (hypertension)   . High cholesterol   . CHF (congestive heart failure)   . BBBB (bilateral bundle branch block)   . LBBB (left bundle branch block)   . Weakness   . Tired   . Seizures 06/11/2012    new onset/notes (06/11/2012)  . SOB (shortness of breath)     "sometimes when I lay down;; related to not taking my RX" (06/11/2012)   Assessment: 79 year old male who has been on coumadin prior to admit. INR was trending up yesterday and had actually reached therapeutic range at 2.0. Orders received last night to change anticoagulation to xarelto to facilitate less monitoring and sooner cardioversion.  INR drawn this morning was elevated >5, this is most likely due to his xarelto dosing and the direct xa inibitor potentiating his INR and giving an falsely elevated level. His renal function has also worsened overnight so will decrease dose of xarelto to 15mg  daily. INR's will continue to elevated and inaccurate while on xarelto so there is not much need in continuing daily INR's.  Will retime xarelto dosing for  noon to facilitate cardioversion tomorrow afternoon. Will need xarelto changed back to evening dosing post dccv.    Plan:  Decrease xarelto to 15mg  daily-give at noon x 2 days then back to evening meal. Will continue to follow renal function, if returns to normal will be able to change back to 20mg  daily at d/c.  Severiano Gilbert 06/28/2012,9:25 AM

## 2012-06-28 NOTE — Progress Notes (Signed)
Advanced Heart Failure Rounding Note  Primary Cardiologist: Brackbill  Subjective:    79 y.o. [name] w/ PMHx significant for Chronic Systolic CHF (EF 96%), NICM (normal coronary anatomy 2005), LBBB, PAF (on coumadin), HTN, HLD, CVA, and Seizure (2/2 CVA 05/2012).  Spanish speaking with some understanding of English.  Admitted with progressive dyspnea and chest pain.   In the ED, CXR was without acute cardiopulmonary findings. Labs were significant for normal poc troponin, pBNP 32,268 (previously 15,163), INR 2.86, dig level 1.2, BUN/Crt 31/1.31, alk phos 174, otherwise unremarkable CBC/CMET.  Did not have trouble breathing last night.  Feeling better.  Has not ambulated today but will try to walk today.  I/Os - 1L but weight the same. Cr up from 1.17-> 1.52  Amiodarone loaded yesterday for TEE/DCCV tomorrow.    Objective:   Vital Signs:   Temp:  [97 F (36.1 C)-98.3 F (36.8 C)] 98.1 F (36.7 C) (01/09 0548) Pulse Rate:  [52-69] 60  (01/09 1047) Resp:  [18-20] 18  (01/09 1041) BP: (101-109)/(45-58) 101/54 mmHg (01/09 1041) SpO2:  [96 %-99 %] 98 % (01/09 1041) Weight:  [70 kg (154 lb 5.2 oz)] 70 kg (154 lb 5.2 oz) (01/09 0548) Last BM Date: 06/26/12  Weight change: Filed Weights   06/26/12 0412 06/27/12 0545 06/28/12 0548  Weight: 70.58 kg (155 lb 9.6 oz) 70.2 kg (154 lb 12.2 oz) 70 kg (154 lb 5.2 oz)    Intake/Output:   Intake/Output Summary (Last 24 hours) at 06/28/12 1106 Last data filed at 06/28/12 1055  Gross per 24 hour  Intake   1611 ml  Output   1975 ml  Net   -364 ml     Physical Exam: General:  Well appearing. No resp difficulty.  Spanish speaking  HEENT: normal Neck: supple. JVP jaw. Carotids 2+ bilat; no bruits. No lymphadenopathy or thryomegaly appreciated. Cor: PMI nondisplaced. Irregular irregular.  No rubs, gallops or murmurs. Lungs: clear Abdomen: soft, nontender, + distended. No hepatosplenomegaly. No bruits or masses. Good bowel  sounds. Extremities: no cyanosis, clubbing, rash, 2+ edema  L>R TED hose in place Neuro: alert & orientedx3, cranial nerves grossly intact. moves all 4 extremities w/o difficulty. Affect pleasant  Telemetry: afib/flutter 50- 60s  Labs: Basic Metabolic Panel:  Lab 06/28/12 0454 06/27/12 0435 06/26/12 0620 06/25/12 0553 06/24/12 1223  NA 136 138 138 139 137  K 4.1 4.1 3.3* 3.8 4.2  CL 95* 98 95* 99 98  CO2 29 30 28 27 27   GLUCOSE 141* 106* 85 104* 138*  BUN 34* 28* 29* 24* 25*  CREATININE 1.52* 1.17 1.14 1.12 1.12  CALCIUM 9.3 8.8 8.7 -- --  MG -- -- 1.8 -- --  PHOS -- -- -- -- --    Liver Function Tests:  Lab 06/22/12 1434  AST 29  ALT 42  ALKPHOS 174*  BILITOT 0.8  PROT 6.6  ALBUMIN 3.4*   No results found for this basename: LIPASE:5,AMYLASE:5 in the last 168 hours No results found for this basename: AMMONIA:3 in the last 168 hours  CBC:  Lab 06/25/12 0553 06/23/12 0614 06/22/12 1434  WBC 10.2 9.3 10.2  NEUTROABS -- 6.1 --  HGB 14.7 13.8 15.3  HCT 43.1 41.4 45.5  MCV 93.1 92.8 93.4  PLT 233 234 244    Cardiac Enzymes:  Lab 06/23/12 0614 06/23/12 0020 06/22/12 2021  CKTOTAL -- -- --  CKMB -- -- --  CKMBINDEX -- -- --  TROPONINI <0.30 <0.30 <0.30  BNP: BNP (last 3 results)  Basename 06/22/12 1434 06/11/12 0401 04/04/12 1245  PROBNP 32268.0* 15163.0* 9002.0*        Imaging: No results found.   Medications:     Scheduled Medications:    . bisacodyl  10 mg Rectal Once  . carvedilol  3.125 mg Oral BID WC  . digoxin  0.0625 mg Oral Daily  . docusate sodium  200 mg Oral BID  . enalapril  2.5 mg Oral BID  . famotidine  40 mg Oral Daily  . fenofibrate  54 mg Oral Daily  . furosemide  80 mg Intravenous TID  . loratadine  10 mg Oral Daily  . nitroGLYCERIN  0.5 inch Topical Q6H  . polyethylene glycol  17 g Oral Daily  . rivaroxaban  15 mg Oral Daily  . rivaroxaban  15 mg Oral Q supper  . simvastatin  20 mg Oral QPM  . sodium chloride  3  mL Intravenous Q12H  . spironolactone  25 mg Oral Daily    Infusions:    . amiodarone (NEXTERONE PREMIX) 360 mg/200 mL dextrose 30 mg/hr (06/28/12 0518)  . milrinone 0.25 mcg/kg/min (06/28/12 0358)    PRN Medications: sodium chloride, acetaminophen, bisacodyl, nitroGLYCERIN, ondansetron (ZOFRAN) IV, sodium chloride   Assessment:   1. Acute on chronic systolic CHF  2. Atrial fibrillation, now with a controlled VR  3. H/o stroke  4. HTN  5. LBBB 6. Acute renal failure (baseline Cr 1.1-1.3)  Plan/Discussion:    Symptomatically improved but still with volume on board and renal function now getting worse. Will continue milrinone. Cut lasix back to 80 bid and hold metolazone.    Xarelto started yesterday.  INR supratherapeutic today but xarelto can elevate PT/INR.  Will monitor for any signs of bleeding.  Decrease xarelto 15 mg due to elevated Cr.  Will plan for TEE/DCCV tomorrow.  Continue amiodarone load. Will need to be careful with bradycardia around time of DC-CV.    Continue to ambulate.    Will need RHC early next week off milrinone.   BIVICD as outpatient.   Daniel Bensimhon,MD 11:10 AM     Length of Stay: 6

## 2012-06-29 ENCOUNTER — Encounter (HOSPITAL_COMMUNITY)
Admission: EM | Disposition: A | Discharge status: Home / Self Care | Payer: Self-pay | Source: Home / Self Care | Attending: Internal Medicine

## 2012-06-29 ENCOUNTER — Encounter (HOSPITAL_COMMUNITY): Payer: Self-pay | Admitting: *Deleted

## 2012-06-29 ENCOUNTER — Encounter (HOSPITAL_COMMUNITY): Payer: Self-pay | Admitting: Anesthesiology

## 2012-06-29 DIAGNOSIS — I4891 Unspecified atrial fibrillation: Secondary | ICD-10-CM

## 2012-06-29 HISTORY — PX: TEE WITHOUT CARDIOVERSION: SHX5443

## 2012-06-29 LAB — BASIC METABOLIC PANEL
Calcium: 9.4 mg/dL (ref 8.4–10.5)
Creatinine, Ser: 1.59 mg/dL — ABNORMAL HIGH (ref 0.50–1.35)
GFR calc Af Amer: 47 mL/min — ABNORMAL LOW (ref >90)

## 2012-06-29 LAB — PROTIME-INR: Prothrombin Time: 29.2 seconds — ABNORMAL HIGH (ref 11.6–15.2)

## 2012-06-29 SURGERY — ECHOCARDIOGRAM, TRANSESOPHAGEAL
Anesthesia: Moderate Sedation

## 2012-06-29 MED ORDER — POTASSIUM CHLORIDE CRYS ER 20 MEQ PO TBCR
40.0000 meq | EXTENDED_RELEASE_TABLET | Freq: Once | ORAL | Status: AC
  Administered 2012-06-29: 40 meq via ORAL
  Filled 2012-06-29: qty 2

## 2012-06-29 MED ORDER — LIDOCAINE VISCOUS 2 % MT SOLN
OROMUCOSAL | Status: DC | PRN
  Administered 2012-06-29: 10 mL via OROMUCOSAL

## 2012-06-29 MED ORDER — FENTANYL CITRATE 0.05 MG/ML IJ SOLN
INTRAMUSCULAR | Status: DC | PRN
  Administered 2012-06-29: 12.5 ug via INTRAVENOUS
  Administered 2012-06-29: 25 ug via INTRAVENOUS

## 2012-06-29 MED ORDER — LIDOCAINE VISCOUS 2 % MT SOLN
OROMUCOSAL | Status: AC
  Filled 2012-06-29: qty 15

## 2012-06-29 MED ORDER — FENTANYL CITRATE 0.05 MG/ML IJ SOLN
INTRAMUSCULAR | Status: AC
  Filled 2012-06-29: qty 2

## 2012-06-29 MED ORDER — MIDAZOLAM HCL 5 MG/ML IJ SOLN
INTRAMUSCULAR | Status: AC
  Filled 2012-06-29: qty 2

## 2012-06-29 MED ORDER — MIDAZOLAM HCL 10 MG/2ML IJ SOLN
INTRAMUSCULAR | Status: DC | PRN
  Administered 2012-06-29 (×2): 2 mg via INTRAVENOUS

## 2012-06-29 NOTE — H&P (View-Only) (Signed)
Advanced Heart Failure Rounding Note  Primary Cardiologist: Brackbill  Subjective:    79 y.o. [place] male w/ PMHx significant for Chronic Systolic CHF (EF 20%), NICM (normal coronary anatomy 2005), LBBB, PAF (on coumadin), HTN, HLD, CVA, and Seizure (2/2 CVA 05/2012).  Spanish speaking with some understanding of English.  Admitted with progressive dyspnea and chest pain.   In the ED, CXR was without acute cardiopulmonary findings. Labs were significant for normal poc troponin, pBNP 32,268 (previously 15,163), INR 2.86, dig level 1.2, BUN/Crt 31/1.31, alk phos 174, otherwise unremarkable CBC/CMET.  No breathing difficulty.  Last night slept well.  Ambulating without difficulty.  Remains on milrinone. Cr up from 1.17-> 1.52->1.59.  Wt down 11 pounds since admit, 5 overnight.    Amiodarone started for TEE/DCCV tomorrow.  HR 50-60s.  [name] 80-90s.  Meds held this am.      INR trending down.    Objective:   Vital Signs:   Temp:  [97.2 F (36.2 C)-98 F (36.7 C)] 98 F (36.7 C) (01/10 0549) Pulse Rate:  [49-62] 62  (01/10 1201) Resp:  [16-20] 16  (01/10 1201) BP: (88-98)/(39-64) 93/59 mmHg (01/10 1201) SpO2:  [84 %-100 %] 84 % (01/10 0950) Weight:  [67.9 kg (149 lb 11.1 oz)] 67.9 kg (149 lb 11.1 oz) (01/10 1047) Last BM Date: 06/28/12  Weight change: Filed Weights   06/27/12 0545 06/28/12 0548 06/29/12 1047  Weight: 70.2 kg (154 lb 12.2 oz) 70 kg (154 lb 5.2 oz) 67.9 kg (149 lb 11.1 oz)    Intake/Output:   Intake/Output Summary (Last 24 hours) at 06/29/12 1235 Last data filed at 06/29/12 1154  Gross per 24 hour  Intake 1503.03 ml  Output   2525 ml  Net -1021.97 ml     Physical Exam: General:  Well appearing. No resp difficulty.  Spanish speaking  HEENT: normal Neck: supple. JVP 9-10. Carotids 2+ bilat; no bruits. No lymphadenopathy or thryomegaly appreciated. Cor: PMI nondisplaced. Irregular irregular.  No rubs, gallops or murmurs. Lungs: clear Abdomen: soft, nontender, +  mildly distended. No hepatosplenomegaly. No bruits or masses. Good bowel sounds. Extremities: no cyanosis, clubbing, rash, tr  L>R TED hose in place Neuro: alert & orientedx3, cranial nerves grossly intact. moves all 4 extremities w/o difficulty. Affect pleasant  Telemetry: afib/flutter 50- 60s  Labs: Basic Metabolic Panel:  Lab 06/29/12 0545 06/28/12 1412 06/28/12 0500 06/27/12 0435 06/26/12 0620  NA 136 134* 136 138 138  K 3.6 4.2 4.1 4.1 3.3*  CL 93* 90* 95* 98 95*  CO2 31 27 29 30 28  GLUCOSE 106* 126* 141* 106* 85  BUN 37* 37* 34* 28* 29*  CREATININE 1.59* 1.50* 1.52* 1.17 1.14  CALCIUM 9.4 9.5 9.3 -- --  MG -- -- -- -- 1.8  PHOS -- -- -- -- --    Liver Function Tests:  Lab 06/22/12 1434  AST 29  ALT 42  ALKPHOS 174*  BILITOT 0.8  PROT 6.6  ALBUMIN 3.4*   No results found for this basename: LIPASE:5,AMYLASE:5 in the last 168 hours No results found for this basename: AMMONIA:3 in the last 168 hours  CBC:  Lab 06/25/12 0553 06/23/12 0614 06/22/12 1434  WBC 10.2 9.3 10.2  NEUTROABS -- 6.1 --  HGB 14.7 13.8 15.3  HCT 43.1 41.4 45.5  MCV 93.1 92.8 93.4  PLT 233 234 244    Cardiac Enzymes:  Lab 06/23/12 0614 06/23/12 0020 06/22/12 2021  CKTOTAL -- -- --  CKMB -- -- --    CKMBINDEX -- -- --  TROPONINI <0.30 <0.30 <0.30    BNP: BNP (last 3 results)  Basename 06/22/12 1434 06/11/12 0401 04/04/12 1245  PROBNP 32268.0* 15163.0* 9002.0*        Imaging: No results found.   Medications:     Scheduled Medications:    . bisacodyl  10 mg Rectal Once  . carvedilol  3.125 mg Oral BID WC  . digoxin  0.0625 mg Oral Daily  . docusate sodium  200 mg Oral BID  . enalapril  2.5 mg Oral BID  . famotidine  40 mg Oral Daily  . fenofibrate  54 mg Oral Daily  . loratadine  10 mg Oral Daily  . polyethylene glycol  17 g Oral Daily  . rivaroxaban  15 mg Oral Q supper  . simvastatin  20 mg Oral QPM  . sodium chloride  3 mL Intravenous Q12H  . spironolactone   25 mg Oral Daily    Infusions:    . amiodarone (NEXTERONE PREMIX) 360 mg/200 mL dextrose 30 mg/hr (06/29/12 0608)  . milrinone 0.25 mcg/kg/min (06/28/12 2121)    PRN Medications: sodium chloride, acetaminophen, bisacodyl, nitroGLYCERIN, ondansetron (ZOFRAN) IV, sodium chloride   Assessment:   1. Acute on chronic systolic CHF  2. Atrial fibrillation, now with a controlled VR  3. H/o stroke  4. HTN  5. LBBB 6. Acute renal failure (baseline Cr 1.1-1.3)  Plan/Discussion:    His weight is down to 149 pounds, volume status improving and renal function now getting worse. Will continue milrinone.  Stop IV lasix.  Will plan to restart lasix 40 mg twice daily in the morning.  Supp K. Would continue milrinone for 24 more hours and begin to wean on Sunday for possible RHC on Monday.  Suspect he may need home inotropes. Plan for BiVICD in next few weeks as outpt.  Xarelto started 1/8.  Will plan for TEE/DCCV today.  Continue amiodarone load. Will need to be careful with bradycardia around time of DC-CV.  Is on low-dose carvedilol. No ACE yet due to renal failure.   Continue to ambulate.    Length of Stay: 7  Glenda Kunst,MD 12:37 PM       

## 2012-06-29 NOTE — Anesthesia Preprocedure Evaluation (Deleted)
Anesthesia Evaluation  Patient identified by MRN, date of birth, ID band Patient awake    Airway Mallampati: I      Dental  (+) Edentulous Upper and Edentulous Lower   Pulmonary shortness of breath,    Pulmonary exam normal       Cardiovascular hypertension, + Peripheral Vascular Disease and +CHF + dysrhythmias Rhythm:Irregular Rate:Normal     Neuro/Psych    GI/Hepatic   Endo/Other    Renal/GU Renal disease     Musculoskeletal   Abdominal Normal abdominal exam  (+)   Peds  Hematology   Anesthesia Other Findings   Reproductive/Obstetrics                           Anesthesia Physical Anesthesia Plan  ASA: III  Anesthesia Plan: General   Post-op Pain Management:    Induction: Intravenous  Airway Management Planned: Mask and Natural Airway  Additional Equipment:   Intra-op Plan:   Post-operative Plan:   Informed Consent:   Plan Discussed with: CRNA, Anesthesiologist and Surgeon  Anesthesia Plan Comments:         Anesthesia Quick Evaluation

## 2012-06-29 NOTE — Progress Notes (Signed)
1611  Transported back from endoscopy dept  via stretcher  With nurse . Pt fully  Awake alert and oriented . Tyler Christensen Explained  Pt's condition and plan of care as ordered  By MD  By assistance from a spanish interpreter. verbalized understanding . Tyler Christensen Pt denied pain and no shortness of breath.  Kept  Pt comfortable in bed

## 2012-06-29 NOTE — Interval H&P Note (Signed)
History and Physical Interval Note:  06/29/2012 2:34 PM  Tyler Christensen  has presented today for surgery, with the diagnosis of a fib  The various methods of treatment have been discussed with the patient and family. After consideration of risks, benefits and other options for treatment, the patient has consented to  Procedure(s) (LRB) with comments: TRANSESOPHAGEAL ECHOCARDIOGRAM (TEE) (N/A) - will need a spanish interpreter Interpreter will be here at 1330-Hope spoke w/ [place] CARDIOVERSION (N/A) as a surgical intervention .  The patient's history has been reviewed, patient examined, no change in status, stable for surgery.  I have reviewed the patient's chart and labs.  Questions were answered to the patient's satisfaction.     Dietrich Pates

## 2012-06-29 NOTE — Progress Notes (Signed)
Advanced Heart Failure Rounding Note  Primary Cardiologist: Brackbill  Subjective:    79 y.o. [name] w/ PMHx significant for Chronic Systolic CHF (EF 96%), NICM (normal coronary anatomy 2005), LBBB, PAF (on coumadin), HTN, HLD, CVA, and Seizure (2/2 CVA 05/2012).  Spanish speaking with some understanding of English.  Admitted with progressive dyspnea and chest pain.   In the ED, CXR was without acute cardiopulmonary findings. Labs were significant for normal poc troponin, pBNP 32,268 (previously 15,163), INR 2.86, dig level 1.2, BUN/Crt 31/1.31, alk phos 174, otherwise unremarkable CBC/CMET.  No breathing difficulty.  Last night slept well.  Ambulating without difficulty.  Remains on milrinone. Cr up from 1.17-> 1.52->1.59.  Wt down 11 pounds since admit, 5 overnight.    Amiodarone started for TEE/DCCV tomorrow.  HR 50-60s.  [name] 80-90s.  Meds held this am.      INR trending down.    Objective:   Vital Signs:   Temp:  [97.2 F (36.2 C)-98 F (36.7 C)] 98 F (36.7 C) (01/10 0549) Pulse Rate:  [49-62] 62  (01/10 1201) Resp:  [16-20] 16  (01/10 1201) BP: (88-98)/(39-64) 93/59 mmHg (01/10 1201) SpO2:  [84 %-100 %] 84 % (01/10 0950) Weight:  [67.9 kg (149 lb 11.1 oz)] 67.9 kg (149 lb 11.1 oz) (01/10 1047) Last BM Date: 06/28/12  Weight change: Filed Weights   06/27/12 0545 06/28/12 0548 06/29/12 1047  Weight: 70.2 kg (154 lb 12.2 oz) 70 kg (154 lb 5.2 oz) 67.9 kg (149 lb 11.1 oz)    Intake/Output:   Intake/Output Summary (Last 24 hours) at 06/29/12 1235 Last data filed at 06/29/12 1154  Gross per 24 hour  Intake 1503.03 ml  Output   2525 ml  Net -1021.97 ml     Physical Exam: General:  Well appearing. No resp difficulty.  Spanish speaking  HEENT: normal Neck: supple. JVP 9-10. Carotids 2+ bilat; no bruits. No lymphadenopathy or thryomegaly appreciated. Cor: PMI nondisplaced. Irregular irregular.  No rubs, gallops or murmurs. Lungs: clear Abdomen: soft, nontender, +  mildly distended. No hepatosplenomegaly. No bruits or masses. Good bowel sounds. Extremities: no cyanosis, clubbing, rash, tr  L>R TED hose in place Neuro: alert & orientedx3, cranial nerves grossly intact. moves all 4 extremities w/o difficulty. Affect pleasant  Telemetry: afib/flutter 50- 60s  Labs: Basic Metabolic Panel:  Lab 06/29/12 0454 06/28/12 1412 06/28/12 0500 06/27/12 0435 06/26/12 0620  NA 136 134* 136 138 138  K 3.6 4.2 4.1 4.1 3.3*  CL 93* 90* 95* 98 95*  CO2 31 27 29 30 28   GLUCOSE 106* 126* 141* 106* 85  BUN 37* 37* 34* 28* 29*  CREATININE 1.59* 1.50* 1.52* 1.17 1.14  CALCIUM 9.4 9.5 9.3 -- --  MG -- -- -- -- 1.8  PHOS -- -- -- -- --    Liver Function Tests:  Lab 06/22/12 1434  AST 29  ALT 42  ALKPHOS 174*  BILITOT 0.8  PROT 6.6  ALBUMIN 3.4*   No results found for this basename: LIPASE:5,AMYLASE:5 in the last 168 hours No results found for this basename: AMMONIA:3 in the last 168 hours  CBC:  Lab 06/25/12 0553 06/23/12 0614 06/22/12 1434  WBC 10.2 9.3 10.2  NEUTROABS -- 6.1 --  HGB 14.7 13.8 15.3  HCT 43.1 41.4 45.5  MCV 93.1 92.8 93.4  PLT 233 234 244    Cardiac Enzymes:  Lab 06/23/12 0614 06/23/12 0020 06/22/12 2021  CKTOTAL -- -- --  CKMB -- -- --  CKMBINDEX -- -- --  TROPONINI <0.30 <0.30 <0.30    BNP: BNP (last 3 results)  Basename 06/22/12 1434 06/11/12 0401 04/04/12 1245  PROBNP 32268.0* 15163.0* 9002.0*        Imaging: No results found.   Medications:     Scheduled Medications:    . bisacodyl  10 mg Rectal Once  . carvedilol  3.125 mg Oral BID WC  . digoxin  0.0625 mg Oral Daily  . docusate sodium  200 mg Oral BID  . enalapril  2.5 mg Oral BID  . famotidine  40 mg Oral Daily  . fenofibrate  54 mg Oral Daily  . loratadine  10 mg Oral Daily  . polyethylene glycol  17 g Oral Daily  . rivaroxaban  15 mg Oral Q supper  . simvastatin  20 mg Oral QPM  . sodium chloride  3 mL Intravenous Q12H  . spironolactone   25 mg Oral Daily    Infusions:    . amiodarone (NEXTERONE PREMIX) 360 mg/200 mL dextrose 30 mg/hr (06/29/12 7846)  . milrinone 0.25 mcg/kg/min (06/28/12 2121)    PRN Medications: sodium chloride, acetaminophen, bisacodyl, nitroGLYCERIN, ondansetron (ZOFRAN) IV, sodium chloride   Assessment:   1. Acute on chronic systolic CHF  2. Atrial fibrillation, now with a controlled VR  3. H/o stroke  4. HTN  5. LBBB 6. Acute renal failure (baseline Cr 1.1-1.3)  Plan/Discussion:    His weight is down to 149 pounds, volume status improving and renal function now getting worse. Will continue milrinone.  Stop IV lasix.  Will plan to restart lasix 40 mg twice daily in the morning.  Supp K. Would continue milrinone for 24 more hours and begin to wean on Sunday for possible RHC on Monday.  Suspect he may need home inotropes. Plan for BiVICD in next few weeks as outpt.  Xarelto started 1/8.  Will plan for TEE/DCCV today.  Continue amiodarone load. Will need to be careful with bradycardia around time of DC-CV.  Is on low-dose carvedilol. No ACE yet due to renal failure.   Continue to ambulate.    Length of Stay: 7  Daniel Bensimhon,MD 12:37 PM

## 2012-06-29 NOTE — Op Note (Signed)
Full report in TEE report of CV reporting Thrombus in LA appendage Severe LV/RV dysfunction.

## 2012-06-30 LAB — BASIC METABOLIC PANEL
CO2: 30 mEq/L (ref 19–32)
Glucose, Bld: 87 mg/dL (ref 70–99)
Potassium: 4.5 mEq/L (ref 3.5–5.1)
Sodium: 137 mEq/L (ref 135–145)

## 2012-06-30 NOTE — Progress Notes (Signed)
Advanced Heart Failure Rounding Note  Primary Cardiologist: Brackbill  Subjective:    79 y.o. [name] w/ PMHx significant for Chronic Systolic CHF (EF 16%), NICM (normal coronary anatomy 2005), LBBB, PAF (on coumadin), HTN, HLD, CVA, and Seizure (2/2 CVA 05/2012).  Spanish speaking with some understanding of English.  Admitted with ADHF.  Sluggish diuresis so started on milrinone. Weight down 11 pounds. Lasix held yesterday as Cr trending up. 1.17-> 1.52->1.59 -> 1.8   Weight stable overnight off lasix. Denies orthopnea or PND. TEE yesterday with LAA clot so no DC-CV. Amio stopped.    Objective:   Vital Signs:   Temp:  [97.1 F (36.2 C)-97.9 F (36.6 C)] 97.6 F (36.4 C) (01/11 0500) Pulse Rate:  [61-73] 65  (01/11 0947) Resp:  [16-26] 18  (01/11 0947) BP: (84-150)/(46-115) 106/53 mmHg (01/11 0947) SpO2:  [96 %-100 %] 98 % (01/11 0947) Weight:  [67.9 kg (149 lb 11.1 oz)-67.994 kg (149 lb 14.4 oz)] 67.994 kg (149 lb 14.4 oz) (01/11 0500) Last BM Date: 06/29/12  Weight change: Filed Weights   06/28/12 0548 06/29/12 1047 06/30/12 0500  Weight: 70 kg (154 lb 5.2 oz) 67.9 kg (149 lb 11.1 oz) 67.994 kg (149 lb 14.4 oz)    Intake/Output:   Intake/Output Summary (Last 24 hours) at 06/30/12 1015 Last data filed at 06/30/12 0900  Gross per 24 hour  Intake    230 ml  Output   1250 ml  Net  -1020 ml     Physical Exam: General:  Well appearing. No resp difficulty.  Spanish speaking  HEENT: normal Neck: supple. JVP 8 Carotids 2+ bilat; no bruits. No lymphadenopathy or thryomegaly appreciated. Cor: PMI nondisplaced. Irregular irregular.  No rubs, gallops or murmurs. Lungs: clear Abdomen: soft, nontender, + mildly distended. No hepatosplenomegaly. No bruits or masses. Good bowel sounds. Extremities: no cyanosis, clubbing, rash, tr  L>R TED hose in place Neuro: alert & orientedx3, cranial nerves grossly intact. moves all 4 extremities w/o difficulty. Affect  pleasant  Telemetry: afib/flutter 50- 60s  Labs: Basic Metabolic Panel:  Lab 06/30/12 1096 06/29/12 0545 06/28/12 1412 06/28/12 0500 06/27/12 0435 06/26/12 0620  NA 137 136 134* 136 138 --  K 4.5 3.6 4.2 4.1 4.1 --  CL 96 93* 90* 95* 98 --  CO2 30 31 27 29 30  --  GLUCOSE 87 106* 126* 141* 106* --  BUN 42* 37* 37* 34* 28* --  CREATININE 1.83* 1.59* 1.50* 1.52* 1.17 --  CALCIUM 9.2 9.4 9.5 -- -- --  MG -- -- -- -- -- 1.8  PHOS -- -- -- -- -- --    Liver Function Tests: No results found for this basename: AST:5,ALT:5,ALKPHOS:5,BILITOT:5,PROT:5,ALBUMIN:5 in the last 168 hours No results found for this basename: LIPASE:5,AMYLASE:5 in the last 168 hours No results found for this basename: AMMONIA:3 in the last 168 hours  CBC:  Lab 06/25/12 0553  WBC 10.2  NEUTROABS --  HGB 14.7  HCT 43.1  MCV 93.1  PLT 233    Cardiac Enzymes: No results found for this basename: CKTOTAL:5,CKMB:5,CKMBINDEX:5,TROPONINI:5 in the last 168 hours  BNP: BNP (last 3 results)  Basename 06/22/12 1434 06/11/12 0401 04/04/12 1245  PROBNP 32268.0* 15163.0* 9002.0*        Imaging: No results found.   Medications:     Scheduled Medications:    . bisacodyl  10 mg Rectal Once  . carvedilol  3.125 mg Oral BID WC  . digoxin  0.0625 mg Oral Daily  .  docusate sodium  200 mg Oral BID  . famotidine  40 mg Oral Daily  . fenofibrate  54 mg Oral Daily  . loratadine  10 mg Oral Daily  . polyethylene glycol  17 g Oral Daily  . rivaroxaban  15 mg Oral Q supper  . simvastatin  20 mg Oral QPM  . sodium chloride  3 mL Intravenous Q12H    Infusions:    . milrinone 0.25 mcg/kg/min (06/29/12 2107)    PRN Medications: sodium chloride, acetaminophen, bisacodyl, nitroGLYCERIN, ondansetron (ZOFRAN) IV, sodium chloride   Assessment:   1. Acute on chronic systolic CHF  2. Atrial fibrillation, now with a controlled VR  3. H/o stroke  4. HTN  5. LBBB 6. Acute renal failure (baseline Cr  1.1-1.3)  Plan/Discussion:     Volume status stable. Continue to hold lasix. Will also hold ACE-I and spiro. Continue milrinone for now. Suspect he will need home inotropes. Once renal function improving will stop milrinone and proceed with RHC (and possible cor angio).   Will need 1 month anti-coag prior to DC-CV. Amio stopped.   Likely will plan CRT-D 2 weeks post-hospital. May need LifeVest on d/c.  Continue to ambulate.    Will need extensive SW help to arrange care in community.  Situation d/w patient through interpreter today.  Length of Stay: 8   Daniel Bensimhon,MD 10:15 AM

## 2012-07-01 LAB — BASIC METABOLIC PANEL
BUN: 36 mg/dL — ABNORMAL HIGH (ref 6–23)
CO2: 27 mEq/L (ref 19–32)
Chloride: 96 mEq/L (ref 96–112)
GFR calc Af Amer: 48 mL/min — ABNORMAL LOW (ref >90)
Glucose, Bld: 95 mg/dL (ref 70–99)
Potassium: 3.8 mEq/L (ref 3.5–5.1)

## 2012-07-01 MED ORDER — ISOSORBIDE MONONITRATE ER 30 MG PO TB24
30.0000 mg | ORAL_TABLET | Freq: Every day | ORAL | Status: DC
  Administered 2012-07-01 – 2012-07-02 (×2): 30 mg via ORAL
  Filled 2012-07-01 (×2): qty 1

## 2012-07-01 MED ORDER — SODIUM CHLORIDE 0.9 % IJ SOLN: 3.0000 mL | Freq: Two times a day (BID) | INTRAMUSCULAR | Status: DC

## 2012-07-01 MED ORDER — SODIUM CHLORIDE 0.9 % IV SOLN: 250.0000 mL | INTRAVENOUS | Status: DC | PRN

## 2012-07-01 MED ORDER — SODIUM CHLORIDE 0.9 % IJ SOLN: 3.0000 mL | INTRAMUSCULAR | Status: DC | PRN

## 2012-07-01 MED ORDER — ASPIRIN 81 MG PO CHEW
324.0000 mg | CHEWABLE_TABLET | ORAL | Status: AC
  Administered 2012-07-02: 324 mg via ORAL
  Filled 2012-07-01: qty 4

## 2012-07-01 MED ORDER — SODIUM CHLORIDE 0.9 % IV SOLN
INTRAVENOUS | Status: DC
  Administered 2012-07-02: 04:00:00 via INTRAVENOUS

## 2012-07-01 MED ORDER — HYDRALAZINE HCL 10 MG PO TABS
10.0000 mg | ORAL_TABLET | Freq: Three times a day (TID) | ORAL | Status: DC
  Administered 2012-07-01 – 2012-07-02 (×3): 10 mg via ORAL
  Filled 2012-07-01 (×6): qty 1

## 2012-07-01 MED ORDER — SODIUM CHLORIDE 0.9 % IV SOLN: 250.0000 mL | INTRAVENOUS | Status: DC

## 2012-07-01 NOTE — Progress Notes (Signed)
Advanced Heart Failure Rounding Note  Primary Cardiologist: Brackbill  Subjective:    79 y.o. [place] male w/ PMHx significant for Chronic Systolic CHF (EF 20%), NICM (normal coronary anatomy 2005), LBBB, PAF (on coumadin), HTN, HLD, CVA, and Seizure (2/2 CVA 05/2012).  Spanish speaking with some understanding of English.  Admitted with ADHF.  TEE Friday with LAA clot so no DC-CV. Amio stopped.   Sluggish diuresis so started on milrinone. Weight down 11 pounds. Lasix held over the weekend as Cr trending up. Now back down. 1.17-> 1.52->1.59 -> 1.8 -> 1.5, Weight stable. Remains on milrinone. Denies orthopnea or PND.    Objective:   Vital Signs:   Temp:  [97.3 F (36.3 C)-98.2 F (36.8 C)] 98.1 F (36.7 C) (01/12 0517) Pulse Rate:  [62-69] 69  (01/12 0517) Resp:  [18-20] 20  (01/12 0517) BP: (98-104)/(62-72) 104/72 mmHg (01/12 0517) SpO2:  [93 %-96 %] 96 % (01/12 0517) Weight:  [67.7 kg (149 lb 4 oz)] 67.7 kg (149 lb 4 oz) (01/12 0517) Last BM Date: 06/30/12  Weight change: Filed Weights   06/29/12 1047 06/30/12 0500 07/01/12 0517  Weight: 67.9 kg (149 lb 11.1 oz) 67.994 kg (149 lb 14.4 oz) 67.7 kg (149 lb 4 oz)    Intake/Output:   Intake/Output Summary (Last 24 hours) at 07/01/12 0957 Last data filed at 07/01/12 0936  Gross per 24 hour  Intake   1110 ml  Output   1825 ml  Net   -715 ml     Physical Exam: General:  Well appearing. No resp difficulty.  Spanish speaking  HEENT: normal Neck: supple. JVP 8 Carotids 2+ bilat; no bruits. No lymphadenopathy or thryomegaly appreciated. Cor: PMI nondisplaced. Irregular irregular.  No rubs, gallops or murmurs. Lungs: clear Abdomen: soft, nontender, + mildly distended. No hepatosplenomegaly. No bruits or masses. Good bowel sounds. Extremities: no cyanosis, clubbing, rash, tr  L>R TED hose in place Neuro: alert & orientedx3, cranial nerves grossly intact. moves all 4 extremities w/o difficulty. Affect pleasant  Telemetry:  afib/flutter 50- 60s  Labs: Basic Metabolic Panel:  Lab 07/01/12 0535 06/30/12 0525 06/29/12 0545 06/28/12 1412 06/28/12 0500 06/26/12 0620  NA 135 137 136 134* 136 --  K 3.8 4.5 3.6 4.2 4.1 --  CL 96 96 93* 90* 95* --  CO2 27 30 31 27 29 --  GLUCOSE 95 87 106* 126* 141* --  BUN 36* 42* 37* 37* 34* --  CREATININE 1.55* 1.83* 1.59* 1.50* 1.52* --  CALCIUM 9.1 9.2 9.4 -- -- --  MG -- -- -- -- -- 1.8  PHOS -- -- -- -- -- --    Liver Function Tests: No results found for this basename: AST:5,ALT:5,ALKPHOS:5,BILITOT:5,PROT:5,ALBUMIN:5 in the last 168 hours No results found for this basename: LIPASE:5,AMYLASE:5 in the last 168 hours No results found for this basename: AMMONIA:3 in the last 168 hours  CBC:  Lab 06/25/12 0553  WBC 10.2  NEUTROABS --  HGB 14.7  HCT 43.1  MCV 93.1  PLT 233    Cardiac Enzymes: No results found for this basename: CKTOTAL:5,CKMB:5,CKMBINDEX:5,TROPONINI:5 in the last 168 hours  BNP: BNP (last 3 results)  Basename 06/22/12 1434 06/11/12 0401 04/04/12 1245  PROBNP 32268.0* 15163.0* 9002.0*        Imaging: No results found.   Medications:     Scheduled Medications:    . bisacodyl  10 mg Rectal Once  . carvedilol  3.125 mg Oral BID WC  . digoxin  0.0625 mg Oral Daily  .   docusate sodium  200 mg Oral BID  . famotidine  40 mg Oral Daily  . fenofibrate  54 mg Oral Daily  . loratadine  10 mg Oral Daily  . polyethylene glycol  17 g Oral Daily  . rivaroxaban  15 mg Oral Q supper  . simvastatin  20 mg Oral QPM  . sodium chloride  3 mL Intravenous Q12H    Infusions:    . milrinone 0.25 mcg/kg/min (06/29/12 2107)    PRN Medications: sodium chloride, acetaminophen, bisacodyl, nitroGLYCERIN, ondansetron (ZOFRAN) IV, sodium chloride   Assessment:   1. Acute on chronic systolic CHF  2. Atrial fibrillation, now with a controlled VR  3. H/o stroke  4. HTN  5. LBBB 6. Acute renal failure (baseline Cr 1.1-1.3)  Plan/Discussion:       Remains tenuous. Weight and renal function stabilized off diuretics and ACE. Still on milrinone. Will continue to hold diuretics. Start hyrdal/NTG. Stop milrinone. Will RHC in am. If CR 1.5 or less will consider taking a couple of pictures of his coronaries. Will hold Xarelto for cath. Suspect he may need home inotropes.  Will need 1 month anti-coag prior to DC-CV. Amio stopped.   Likely will plan CRT-D 2 weeks post-hospital. May need LifeVest on d/c.  Continue to ambulate.    Will need extensive SW help to arrange care in community.  Situation d/w patient through interpreter today.  Length of Stay: 9   Elizibeth Breau,MD 9:57 AM       

## 2012-07-02 ENCOUNTER — Encounter (HOSPITAL_COMMUNITY): Payer: Self-pay | Admitting: Internal Medicine

## 2012-07-02 ENCOUNTER — Encounter (HOSPITAL_COMMUNITY)
Admission: EM | Disposition: A | Discharge status: Home / Self Care | Payer: Self-pay | Source: Home / Self Care | Attending: Internal Medicine

## 2012-07-02 DIAGNOSIS — I2510 Atherosclerotic heart disease of native coronary artery without angina pectoris: Secondary | ICD-10-CM

## 2012-07-02 HISTORY — PX: LEFT AND RIGHT HEART CATHETERIZATION WITH CORONARY ANGIOGRAM: SHX5449

## 2012-07-02 LAB — BASIC METABOLIC PANEL
BUN: 33 mg/dL — ABNORMAL HIGH (ref 6–23)
CO2: 30 mEq/L (ref 19–32)
Chloride: 100 mEq/L (ref 96–112)
Creatinine, Ser: 1.63 mg/dL — ABNORMAL HIGH (ref 0.50–1.35)
Glucose, Bld: 89 mg/dL (ref 70–99)

## 2012-07-02 LAB — CBC
HCT: 45.2 % (ref 39.0–52.0)
Hemoglobin: 15.2 g/dL (ref 13.0–17.0)
MCH: 30.9 pg (ref 26.0–34.0)
MCV: 91.9 fL (ref 78.0–100.0)
RBC: 4.92 MIL/uL (ref 4.22–5.81)

## 2012-07-02 LAB — POCT I-STAT 3, VENOUS BLOOD GAS (G3P V)
Bicarbonate: 25 mEq/L — ABNORMAL HIGH (ref 20.0–24.0)
O2 Saturation: 32 %
pCO2, Ven: 41.6 mmHg — ABNORMAL LOW (ref 45.0–50.0)
pH, Ven: 7.378 — ABNORMAL HIGH (ref 7.250–7.300)
pO2, Ven: 20 mmHg — CL (ref 30.0–45.0)

## 2012-07-02 LAB — POCT I-STAT 3, ART BLOOD GAS (G3+)
Acid-base deficit: 1 mmol/L (ref 0.0–2.0)
pO2, Arterial: 68 mmHg — ABNORMAL LOW (ref 80.0–100.0)

## 2012-07-02 LAB — PROTIME-INR: Prothrombin Time: 18.1 seconds — ABNORMAL HIGH (ref 11.6–15.2)

## 2012-07-02 SURGERY — LEFT AND RIGHT HEART CATHETERIZATION WITH CORONARY ANGIOGRAM
Anesthesia: LOCAL

## 2012-07-02 MED ORDER — MILRINONE IN DEXTROSE 20 MG/100ML IV SOLN
0.3750 ug/kg/min | INTRAVENOUS | Status: DC
  Administered 2012-07-02 – 2012-07-10 (×11): 0.375 ug/kg/min via INTRAVENOUS
  Filled 2012-07-02 (×15): qty 100

## 2012-07-02 MED ORDER — SODIUM CHLORIDE 0.9 % IV SOLN: 250.0000 mL | INTRAVENOUS | Status: DC | PRN

## 2012-07-02 MED ORDER — ACETAMINOPHEN 325 MG PO TABS: 650.0000 mg | ORAL_TABLET | ORAL | Status: DC | PRN

## 2012-07-02 MED ORDER — HEPARIN SODIUM (PORCINE) 5000 UNIT/ML IJ SOLN
5000.0000 [IU] | Freq: Three times a day (TID) | INTRAMUSCULAR | Status: DC
  Administered 2012-07-02 – 2012-07-03 (×2): 5000 [IU] via SUBCUTANEOUS
  Filled 2012-07-02 (×5): qty 1

## 2012-07-02 MED ORDER — MIDAZOLAM HCL 2 MG/2ML IJ SOLN
INTRAMUSCULAR | Status: AC
  Filled 2012-07-02: qty 2

## 2012-07-02 MED ORDER — NITROGLYCERIN 0.2 MG/ML ON CALL CATH LAB
INTRAVENOUS | Status: AC
  Filled 2012-07-02: qty 1

## 2012-07-02 MED ORDER — SILDENAFIL CITRATE 20 MG PO TABS
20.0000 mg | ORAL_TABLET | Freq: Three times a day (TID) | ORAL | Status: DC
  Administered 2012-07-02 – 2012-07-03 (×4): 20 mg via ORAL
  Filled 2012-07-02 (×10): qty 1

## 2012-07-02 MED ORDER — LIDOCAINE HCL (PF) 1 % IJ SOLN
INTRAMUSCULAR | Status: AC
  Filled 2012-07-02: qty 30

## 2012-07-02 MED ORDER — SODIUM CHLORIDE 0.9 % IJ SOLN
3.0000 mL | Freq: Two times a day (BID) | INTRAMUSCULAR | Status: DC
  Administered 2012-07-03 – 2012-07-08 (×3): 3 mL via INTRAVENOUS

## 2012-07-02 MED ORDER — HEPARIN (PORCINE) IN NACL 2-0.9 UNIT/ML-% IJ SOLN
INTRAMUSCULAR | Status: AC
  Filled 2012-07-02: qty 1000

## 2012-07-02 MED ORDER — FENTANYL CITRATE 0.05 MG/ML IJ SOLN
INTRAMUSCULAR | Status: AC
  Filled 2012-07-02: qty 2

## 2012-07-02 MED ORDER — ONDANSETRON HCL 4 MG/2ML IJ SOLN: 4.0000 mg | Freq: Four times a day (QID) | INTRAMUSCULAR | Status: DC | PRN

## 2012-07-02 MED ORDER — SODIUM CHLORIDE 0.9 % IJ SOLN: 3.0000 mL | INTRAMUSCULAR | Status: DC | PRN

## 2012-07-02 NOTE — Interval H&P Note (Signed)
History and Physical Interval Note:  07/02/2012 3:25 PM  Tyler Christensen  has presented today for surgery, with the diagnosis of A/C systolic HF  The various methods of treatment have been discussed with the patient and family. After consideration of risks, benefits and other options for treatment, the patient has consented to  Procedure(s) (LRB) with comments: LEFT AND RIGHT HEART CATHETERIZATION WITH CORONARY ANGIOGRAM (N/A) as a surgical intervention .  The patient's history has been reviewed, patient examined, no change in status, stable for surgery.  I have reviewed the patient's chart and labs.  Questions were answered to the patient's satisfaction.     Vince Ainsley

## 2012-07-02 NOTE — CV Procedure (Signed)
Cardiac Cath Procedure Note  Indication: HF, PAH  Procedures performed:  1) Right heart cathererization 2) Selective coronary angiography 3) Left heart catheterization  Description of procedure:     The risks and indication of the procedure were explained. Consent was signed and placed on the chart. An appropriate timeout was taken prior to the procedure. The right groin was prepped and draped in the routine sterile fashion and anesthetized with 1% local lidocaine.   A 5 FR arterial sheath was placed in the right femoral artery using a modified Seldinger technique. Standard catheters including a JL4, JR4 and angled pigtail were used. All catheter exchanges were made over a wire. A 7 FR venous sheath was placed in the right femoral vein using a modified Seldinger technique. A standard Swan-Ganz catheter was used for the procedure.   Total contrast used: 15cc  Complications:  None apparent  Findings:  RA = 15 RV = 94/10/13 PA = 93/42 (59)  PCW = 15 Fick cardiac output/index = 1.9/1.1 PVR = 23.0 Woods SVR = 3140  FA sat = 95% PA sat = 31%, 32%  Ao Pressure: 121/60 (84) LV Pressure:  115/12/14 There was no signficant gradient across the aortic valve on pullback.  Left main: Normal  LAD: Large vessel wraps apex. Long 30% lesion in proximal to midsection  LCX: Small OM-1. Normal  RCA: Large dominant vessel. Dominant. 30% midsection. Distal 20%  Assessment:  1. Mild nonobstructive CAD 2. Severe NICM EF 15% with well compensated left sided filling pressures 3. Severe PAH with end-stage cor pulmonale 4. Cardiogenic shock  Plan/Discussion:  He has end-stage biventricular heart failure with markedly elevated pulmonary pressures. Left-sided filling pressures now normal after diuresis. I think the best option is to treat with IV milrinone and PDE-5 inhibitor and bring back to cath lab later in the week to assess response. Given severe LV dysfunction I do not think he is  candidate for IV epoprostenol.  Arvilla Meres, MD 3:58 PM

## 2012-07-02 NOTE — H&P (View-Only) (Signed)
Advanced Heart Failure Rounding Note  Primary Cardiologist: Brackbill  Subjective:    79 y.o. [name] w/ PMHx significant for Chronic Systolic CHF (EF 40%), NICM (normal coronary anatomy 2005), LBBB, PAF (on coumadin), HTN, HLD, CVA, and Seizure (2/2 CVA 05/2012).  Spanish speaking with some understanding of English.  Admitted with ADHF.  TEE Friday with LAA clot so no DC-CV. Amio stopped.   Sluggish diuresis so started on milrinone. Weight down 11 pounds. Lasix held over the weekend as Cr trending up. Now back down. 1.17-> 1.52->1.59 -> 1.8 -> 1.5, Weight stable. Remains on milrinone. Denies orthopnea or PND.    Objective:   Vital Signs:   Temp:  [97.3 F (36.3 C)-98.2 F (36.8 C)] 98.1 F (36.7 C) (01/12 0517) Pulse Rate:  [62-69] 69  (01/12 0517) Resp:  [18-20] 20  (01/12 0517) BP: (98-104)/(62-72) 104/72 mmHg (01/12 0517) SpO2:  [93 %-96 %] 96 % (01/12 0517) Weight:  [67.7 kg (149 lb 4 oz)] 67.7 kg (149 lb 4 oz) (01/12 0517) Last BM Date: 06/30/12  Weight change: Filed Weights   06/29/12 1047 06/30/12 0500 07/01/12 0517  Weight: 67.9 kg (149 lb 11.1 oz) 67.994 kg (149 lb 14.4 oz) 67.7 kg (149 lb 4 oz)    Intake/Output:   Intake/Output Summary (Last 24 hours) at 07/01/12 0957 Last data filed at 07/01/12 0936  Gross per 24 hour  Intake   1110 ml  Output   1825 ml  Net   -715 ml     Physical Exam: General:  Well appearing. No resp difficulty.  Spanish speaking  HEENT: normal Neck: supple. JVP 8 Carotids 2+ bilat; no bruits. No lymphadenopathy or thryomegaly appreciated. Cor: PMI nondisplaced. Irregular irregular.  No rubs, gallops or murmurs. Lungs: clear Abdomen: soft, nontender, + mildly distended. No hepatosplenomegaly. No bruits or masses. Good bowel sounds. Extremities: no cyanosis, clubbing, rash, tr  L>R TED hose in place Neuro: alert & orientedx3, cranial nerves grossly intact. moves all 4 extremities w/o difficulty. Affect pleasant  Telemetry:  afib/flutter 50- 60s  Labs: Basic Metabolic Panel:  Lab 07/01/12 9811 06/30/12 0525 06/29/12 0545 06/28/12 1412 06/28/12 0500 06/26/12 0620  NA 135 137 136 134* 136 --  K 3.8 4.5 3.6 4.2 4.1 --  CL 96 96 93* 90* 95* --  CO2 27 30 31 27 29  --  GLUCOSE 95 87 106* 126* 141* --  BUN 36* 42* 37* 37* 34* --  CREATININE 1.55* 1.83* 1.59* 1.50* 1.52* --  CALCIUM 9.1 9.2 9.4 -- -- --  MG -- -- -- -- -- 1.8  PHOS -- -- -- -- -- --    Liver Function Tests: No results found for this basename: AST:5,ALT:5,ALKPHOS:5,BILITOT:5,PROT:5,ALBUMIN:5 in the last 168 hours No results found for this basename: LIPASE:5,AMYLASE:5 in the last 168 hours No results found for this basename: AMMONIA:3 in the last 168 hours  CBC:  Lab 06/25/12 0553  WBC 10.2  NEUTROABS --  HGB 14.7  HCT 43.1  MCV 93.1  PLT 233    Cardiac Enzymes: No results found for this basename: CKTOTAL:5,CKMB:5,CKMBINDEX:5,TROPONINI:5 in the last 168 hours  BNP: BNP (last 3 results)  Basename 06/22/12 1434 06/11/12 0401 04/04/12 1245  PROBNP 32268.0* 15163.0* 9002.0*        Imaging: No results found.   Medications:     Scheduled Medications:    . bisacodyl  10 mg Rectal Once  . carvedilol  3.125 mg Oral BID WC  . digoxin  0.0625 mg Oral Daily  .  docusate sodium  200 mg Oral BID  . famotidine  40 mg Oral Daily  . fenofibrate  54 mg Oral Daily  . loratadine  10 mg Oral Daily  . polyethylene glycol  17 g Oral Daily  . rivaroxaban  15 mg Oral Q supper  . simvastatin  20 mg Oral QPM  . sodium chloride  3 mL Intravenous Q12H    Infusions:    . milrinone 0.25 mcg/kg/min (06/29/12 2107)    PRN Medications: sodium chloride, acetaminophen, bisacodyl, nitroGLYCERIN, ondansetron (ZOFRAN) IV, sodium chloride   Assessment:   1. Acute on chronic systolic CHF  2. Atrial fibrillation, now with a controlled VR  3. H/o stroke  4. HTN  5. LBBB 6. Acute renal failure (baseline Cr 1.1-1.3)  Plan/Discussion:       Remains tenuous. Weight and renal function stabilized off diuretics and ACE. Still on milrinone. Will continue to hold diuretics. Start hyrdal/NTG. Stop milrinone. Will RHC in am. If CR 1.5 or less will consider taking a couple of pictures of his coronaries. Will hold Xarelto for cath. Suspect he may need home inotropes.  Will need 1 month anti-coag prior to DC-CV. Amio stopped.   Likely will plan CRT-D 2 weeks post-hospital. May need LifeVest on d/c.  Continue to ambulate.    Will need extensive SW help to arrange care in community.  Situation d/w patient through interpreter today.  Length of Stay: 9   Daniel Bensimhon,MD 9:57 AM

## 2012-07-03 ENCOUNTER — Inpatient Hospital Stay (HOSPITAL_COMMUNITY): Payer: Medicare Other

## 2012-07-03 ENCOUNTER — Ambulatory Visit: Payer: Medicare Other | Admitting: Cardiovascular Disease

## 2012-07-03 DIAGNOSIS — I2789 Other specified pulmonary heart diseases: Secondary | ICD-10-CM

## 2012-07-03 DIAGNOSIS — I2609 Other pulmonary embolism with acute cor pulmonale: Secondary | ICD-10-CM

## 2012-07-03 DIAGNOSIS — I2721 Secondary pulmonary arterial hypertension: Secondary | ICD-10-CM

## 2012-07-03 LAB — BASIC METABOLIC PANEL
BUN: 33 mg/dL — ABNORMAL HIGH (ref 6–23)
Chloride: 100 mEq/L (ref 96–112)
Creatinine, Ser: 1.69 mg/dL — ABNORMAL HIGH (ref 0.50–1.35)
GFR calc Af Amer: 43 mL/min — ABNORMAL LOW (ref >90)
GFR calc non Af Amer: 37 mL/min — ABNORMAL LOW (ref >90)

## 2012-07-03 LAB — CBC
HCT: 43.4 % (ref 39.0–52.0)
Hemoglobin: 14.4 g/dL (ref 13.0–17.0)
MCH: 30.9 pg (ref 26.0–34.0)
MCV: 93.1 fL (ref 78.0–100.0)
Platelets: 204 10*3/uL (ref 150–400)
RBC: 4.66 MIL/uL (ref 4.22–5.81)

## 2012-07-03 LAB — MRSA PCR SCREENING: MRSA by PCR: NEGATIVE

## 2012-07-03 MED ORDER — HEPARIN SOD (PORCINE) IN D5W 100 UNIT/ML IV SOLN
INTRAVENOUS | Status: AC
  Filled 2012-07-03: qty 250

## 2012-07-03 MED ORDER — TECHNETIUM TO 99M ALBUMIN AGGREGATED
3.0000 | Freq: Once | INTRAVENOUS | Status: AC | PRN
  Administered 2012-07-03: 3 via INTRAVENOUS

## 2012-07-03 MED ORDER — TECHNETIUM TC 99M DIETHYLENETRIAME-PENTAACETIC ACID: 40.0000 | Freq: Once | INTRAVENOUS | Status: AC | PRN

## 2012-07-03 MED ORDER — SODIUM CHLORIDE 0.9 % IJ SOLN
10.0000 mL | INTRAMUSCULAR | Status: DC | PRN
  Administered 2012-07-10: 10 mL

## 2012-07-03 MED ORDER — HEPARIN (PORCINE) IN NACL 100-0.45 UNIT/ML-% IJ SOLN
1050.0000 [IU]/h | INTRAMUSCULAR | Status: DC
  Administered 2012-07-03: 900 [IU]/h via INTRAVENOUS
  Administered 2012-07-04 (×2): 1050 [IU]/h via INTRAVENOUS
  Filled 2012-07-03 (×3): qty 250

## 2012-07-03 MED ORDER — SODIUM CHLORIDE 0.9 % IJ SOLN
10.0000 mL | Freq: Two times a day (BID) | INTRAMUSCULAR | Status: DC
  Administered 2012-07-03: 20 mL
  Administered 2012-07-07: 10 mL
  Administered 2012-07-07: 12 mL
  Administered 2012-07-08: 10 mL

## 2012-07-03 NOTE — Progress Notes (Signed)
Advanced Heart Failure Rounding Note  Primary Cardiologist: Brackbill  Subjective:    79 y.o. Tyler Christensen male w/ PMHx significant for Chronic Systolic CHF (EF 16%), NICM (normal coronary anatomy 2005), LBBB, PAF (on coumadin), HTN, HLD, CVA, and Seizure (2/2 CVA 05/2012).  Spanish speaking with some understanding of English.  Admitted with ADHF. TEE Friday with LAA clot so no DC-CV. Amio stopped. Milrinone stopped  07/01/12. Yesterday Milrinone and Revatio started post cath.   07/02/12 RHC/LHC RA = 15  RV = 94/10/13  PA = 93/42 (59)  PCW = 15  Fick cardiac output/index = 1.9/1.1  PVR = 23.0 Woods  SVR = 3140  FA sat = 95%  PA sat = 31%, 32%  Ao Pressure: 121/60 (84)  LV Pressure: 115/12/14 1. Mild nonobstructive CAD  2. Severe NICM EF 15% with well compensated left sided filling pressures  3. Severe PAH with end-stage cor pulmonale  4. Cardiogenic shock  1.17-> 1.52->1.59 -> 1.8 -> 1.5> 1.69    Post cath started on milrinone and sildenafil. Feels ok. Breathing Ok. Creatinine up slightly.   Objective:   Vital Signs:   Temp:  [97.4 F (36.3 C)-98.9 F (37.2 C)] 97.5 F (36.4 C) (01/14 0730) Pulse Rate:  [64-110] 65  (01/14 0730) Resp:  [18-24] 18  (01/14 0730) BP: (94-160)/(43-95) 94/43 mmHg (01/14 0730) SpO2:  [95 %-99 %] 98 % (01/14 0730) Weight:  [67.1 kg (147 lb 14.9 oz)] 67.1 kg (147 lb 14.9 oz) (01/14 0500) Last BM Date: 07/01/12  Weight change: Filed Weights   07/01/12 0517 07/02/12 0511 07/03/12 0500  Weight: 67.7 kg (149 lb 4 oz) 67.5 kg (148 lb 13 oz) 67.1 kg (147 lb 14.9 oz)    Intake/Output:   Intake/Output Summary (Last 24 hours) at 07/03/12 0803 Last data filed at 07/03/12 0700  Gross per 24 hour  Intake  211.2 ml  Output    400 ml  Net -188.8 ml     Physical Exam: General:  Well appearing. No resp difficulty.  Spanish speaking  HEENT: normal Neck: supple. JVP 8 Carotids 2+ bilat; no bruits. No lymphadenopathy or thryomegaly appreciated. Cor: PMI  nondisplaced. Irregular irregular.  No rubs, gallops or murmurs. Lungs: clear Abdomen: soft, nontender, + mildly distended. No hepatosplenomegaly. No bruits or masses. Good bowel sounds. Extremities: no cyanosis, clubbing, rash, tr  L>R TED hose in place. Cath site ok.  Neuro: alert & orientedx3, cranial nerves grossly intact. moves all 4 extremities w/o difficulty. Affect pleasant  Telemetry: afib/flutter 50- 60s  Labs: Basic Metabolic Panel:  Lab 07/03/12 1096 07/02/12 2046 07/02/12 0700 07/01/12 0535 06/30/12 0525 06/29/12 0545  NA 137 -- 139 135 137 136  K 3.8 -- 4.6 3.8 4.5 3.6  CL 100 -- 100 96 96 93*  CO2 28 -- 30 27 30 31   GLUCOSE 104* -- 89 95 87 106*  BUN 33* -- 33* 36* 42* 37*  CREATININE 1.69* 1.46* 1.63* 1.55* 1.83* --  CALCIUM 8.8 -- 9.2 9.1 -- --  MG -- -- -- -- -- --  PHOS -- -- -- -- -- --    Liver Function Tests: No results found for this basename: AST:5,ALT:5,ALKPHOS:5,BILITOT:5,PROT:5,ALBUMIN:5 in the last 168 hours No results found for this basename: LIPASE:5,AMYLASE:5 in the last 168 hours No results found for this basename: AMMONIA:3 in the last 168 hours  CBC:  Lab 07/02/12 2046  WBC 9.8  NEUTROABS --  HGB 15.2  HCT 45.2  MCV 91.9  PLT 221  Cardiac Enzymes: No results found for this basename: CKTOTAL:5,CKMB:5,CKMBINDEX:5,TROPONINI:5 in the last 168 hours  BNP: BNP (last 3 results)  Basename 06/22/12 1434 06/11/12 0401 04/04/12 1245  PROBNP 32268.0* 15163.0* 9002.0*        Imaging: No results found.   Medications:     Scheduled Medications:    . bisacodyl  10 mg Rectal Once  . carvedilol  3.125 mg Oral BID WC  . digoxin  0.0625 mg Oral Daily  . docusate sodium  200 mg Oral BID  . famotidine  40 mg Oral Daily  . fenofibrate  54 mg Oral Daily  . loratadine  10 mg Oral Daily  . polyethylene glycol  17 g Oral Daily  . sildenafil  20 mg Oral TID  . simvastatin  20 mg Oral QPM  . sodium chloride  3 mL Intravenous Q12H     Infusions:    . heparin    . milrinone 0.375 mcg/kg/min (07/03/12 0540)    PRN Medications: sodium chloride, acetaminophen, bisacodyl, nitroGLYCERIN, ondansetron (ZOFRAN) IV, sodium chloride   Assessment:   1. Acute on chronic systolic CHF  2. Atrial fibrillation, now with a controlled VR  3. H/o stroke  4. HTN  5. LBBB 6. Acute renal failure (baseline Cr 1.1-1.3) 7. Severe PAH with cor pulmonale and cardiogenic shock  Plan/Discussion:    Cath results with severe PAH and cor pulmonale suspect due to chronically elevated left-sided filling pressures (WHO Group 2). Place PICC. Will treat with milrinone and sildenafil. Plan repeat RHC later in the week. Check VQ scan to exclude chronic PE. Start heparin for AF - will switch to Xarelto on d/c.   Prognosis very poor. Would hold off on ICD for now.   Length of Stay: 11 Kaho Selle,MD 8:03 AM

## 2012-07-03 NOTE — Progress Notes (Signed)
Peripherally Inserted Central Catheter/Midline Placement  The IV Nurse has discussed with the patient and/or persons authorized to consent for the patient, the purpose of this procedure and the potential benefits and risks involved with this procedure.  The benefits include less needle sticks, lab draws from the catheter and patient may be discharged home with the catheter.  Risks include, but not limited to, infection, bleeding, blood clot (thrombus formation), and puncture of an artery; nerve damage and irregular heat beat.  Alternatives to this procedure were also discussed.  PICC/Midline Placement Documentation  PICC / Midline Double Lumen 07/03/12 PICC Right Basilic (Active)       Tyler Christensen 07/03/2012, 12:29 PM

## 2012-07-03 NOTE — Progress Notes (Signed)
ANTICOAGULATION CONSULT NOTE - Follow Up Consult  Pharmacy Consult for Xarelto>>heparin Indication: atrial fibrillation and LV thrombus  No Known Allergies  Patient Measurements: Height: 5\' 8"  (172.7 cm) Weight: 147 lb 14.9 oz (67.1 kg) IBW/kg (Calculated) : 68.4   Vital Signs: Temp: 98.3 F (36.8 C) (01/14 1700) Temp src: Oral (01/14 1700) BP: 104/41 mmHg (01/14 1700) Pulse Rate: 63  (01/14 1700)  Labs:  Basename 07/03/12 1703 07/03/12 0846 07/03/12 0440 07/02/12 2046 07/02/12 0954 07/02/12 0700  HGB -- 14.4 -- 15.2 -- --  HCT -- 43.4 -- 45.2 -- --  PLT -- 204 -- 221 -- --  APTT 47* -- -- -- -- --  LABPROT -- -- -- -- 18.1* --  INR -- -- -- -- 1.55* --  HEPARINUNFRC 0.27* -- -- -- -- --  CREATININE -- -- 1.69* 1.46* -- 1.63*  CKTOTAL -- -- -- -- -- --  CKMB -- -- -- -- -- --  TROPONINI -- -- -- -- -- --    Estimated Creatinine Clearance: 34.7 ml/min (by C-G formula based on Cr of 1.69).   Medications:  Xarelto 15mg  stopped 1/12  Assessment: 79 year old male with PAF and LV clot found on TEE last week. Patient taken for RHC yesterday revealing low cardiac output and elevated pulmonary pressures. He was then started on milrinone to increase cardiac output. Xarelto was stopped prior to cath and sq heparin was resumed post cath x2 doses. Full anticoagulation resumed with IV heparin given clot. May transition back to xarelto once procedures are completed.  Heparin level 0.27 (slightly subtherapeutic) on 900 units/hr. aPTT 47 (also below goal of 66-102 sec). Correlation of initial aPTT and heparin level indicates that Xarelto is no longer in pt's system (no longer affecting heparin levels) so will not need to continue to check aPTT. No bleeding noted.  Goal of Therapy:  Heparin level 0.3-0.7 Monitor platelets by anticoagulation protocol: Yes   Plan:  1. Increase heparin to 1050 units/hr 2. F/u 8 hour heparin level 3. Will d/c daily aPTT  Christoper Fabian, PharmD,  BCPS Clinical pharmacist, pager [phone] 07/03/2012,5:52 PM

## 2012-07-03 NOTE — Progress Notes (Signed)
ANTICOAGULATION CONSULT NOTE - Follow Up Consult  Pharmacy Consult for Xarelto>>heparin Indication: atrial fibrillation and LV thrombus  No Known Allergies  Patient Measurements: Height: 5\' 8"  (172.7 cm) Weight: 147 lb 14.9 oz (67.1 kg) IBW/kg (Calculated) : 68.4  Heparin Dosing Weight: 67  Vital Signs: Temp: 97.9 F (36.6 C) (01/14 1125) Temp src: Oral (01/14 1125) BP: 94/50 mmHg (01/14 1125) Pulse Rate: 71  (01/14 1125)  Labs:  Basename 07/03/12 0846 07/03/12 0440 07/02/12 2046 07/02/12 0954 07/02/12 0700  HGB 14.4 -- 15.2 -- --  HCT 43.4 -- 45.2 -- --  PLT 204 -- 221 -- --  APTT -- -- -- -- --  LABPROT -- -- -- 18.1* --  INR -- -- -- 1.55* --  HEPARINUNFRC -- -- -- -- --  CREATININE -- 1.69* 1.46* -- 1.63*  CKTOTAL -- -- -- -- --  CKMB -- -- -- -- --  TROPONINI -- -- -- -- --    Estimated Creatinine Clearance: 34.7 ml/min (by C-G formula based on Cr of 1.69).   Medications:  Xarelto 15mg  stopped 1/12  Assessment: 79 year old male with PAF and LV clot found on TEE last week. Patient taken for RHC yesterday revealing low cardiac output and elevated pulmonary pressures. He was then started on milrinone to increase cardiac output. Xarelto was stopped prior to cath and sq heparin was resumed post cath x2 doses. D/w Bensimhon this morning and will resume full anticoagulation with IV heparin given clot. May transition back to xarelto once procedures are completed.  Due to patient being on xarelto will also monitor by aptt to ensure accurate heparin dosing.  Goal of Therapy:  Heparin level 0.3-0.7 units/ml aPTT 66-102 seconds Monitor platelets by anticoagulation protocol: Yes   Plan:  Start heparin infusion at 900 units/hr Check anti-Xa level in 8 hours and daily while on heparin Continue to monitor H&H and platelets APTT daily until Xa level is reliable  Severiano Gilbert 07/03/2012,1:38 PM

## 2012-07-04 LAB — HEPARIN LEVEL (UNFRACTIONATED): Heparin Unfractionated: 0.5 IU/mL (ref 0.30–0.70)

## 2012-07-04 LAB — BASIC METABOLIC PANEL
GFR calc Af Amer: 64 mL/min — ABNORMAL LOW (ref >90)
GFR calc non Af Amer: 55 mL/min — ABNORMAL LOW (ref >90)
Potassium: 3.5 mEq/L (ref 3.5–5.1)
Sodium: 138 mEq/L (ref 135–145)

## 2012-07-04 LAB — CBC
Hemoglobin: 13.9 g/dL (ref 13.0–17.0)
Platelets: 189 10*3/uL (ref 150–400)
RBC: 4.43 MIL/uL (ref 4.22–5.81)
WBC: 9 10*3/uL (ref 4.0–10.5)

## 2012-07-04 LAB — CARBOXYHEMOGLOBIN
Carboxyhemoglobin: 1.6 % — ABNORMAL HIGH (ref 0.5–1.5)
Methemoglobin: 1.3 % (ref 0.0–1.5)

## 2012-07-04 MED ORDER — WARFARIN - PHARMACIST DOSING INPATIENT
Freq: Every day | Status: DC
  Administered 2012-07-05 – 2012-07-06 (×2)

## 2012-07-04 MED ORDER — WARFARIN SODIUM 4 MG PO TABS
4.0000 mg | ORAL_TABLET | Freq: Once | ORAL | Status: AC
  Administered 2012-07-04: 4 mg via ORAL
  Filled 2012-07-04: qty 1

## 2012-07-04 MED ORDER — POTASSIUM CHLORIDE CRYS ER 20 MEQ PO TBCR
40.0000 meq | EXTENDED_RELEASE_TABLET | Freq: Once | ORAL | Status: AC
  Administered 2012-07-04: 40 meq via ORAL
  Filled 2012-07-04: qty 2

## 2012-07-04 MED ORDER — SILDENAFIL CITRATE 20 MG PO TABS
40.0000 mg | ORAL_TABLET | Freq: Three times a day (TID) | ORAL | Status: DC
  Administered 2012-07-04 (×3): 40 mg via ORAL
  Filled 2012-07-04 (×6): qty 2

## 2012-07-04 MED ORDER — FUROSEMIDE 40 MG PO TABS
40.0000 mg | ORAL_TABLET | Freq: Every day | ORAL | Status: DC
  Administered 2012-07-05: 40 mg via ORAL
  Filled 2012-07-04 (×2): qty 1

## 2012-07-04 MED ORDER — SPIRONOLACTONE 25 MG PO TABS
25.0000 mg | ORAL_TABLET | Freq: Every day | ORAL | Status: DC
  Administered 2012-07-04 – 2012-07-10 (×7): 25 mg via ORAL
  Filled 2012-07-04 (×7): qty 1

## 2012-07-04 NOTE — Progress Notes (Addendum)
Advanced Heart Failure Rounding Note  Primary Cardiologist: Brackbill  Subjective:    79 y.o. [name] w/ PMHx significant for Chronic Systolic CHF (EF 96%), NICM (normal coronary anatomy 2005), LBBB, PAF (on coumadin), HTN, HLD, CVA, and Seizure (2/2 CVA 05/2012).  Spanish speaking with some understanding of English.  Admitted with ADHF. TEE Friday with LAA clot so no DC-CV. Amio stopped. Milrinone stopped  07/01/12. 1/13//14 Milrinone and Revatio started post cath  due to pulmonary hypertension. 07/03/12 VQ scan low probability for pulmonary embolus. Weight up 2 pounds.   07/02/12 RHC/LHC RA = 15  RV = 94/10/13  PA = 93/42 (59)  PCW = 15  Fick cardiac output/index = 1.9/1.1  PVR = 23.0 Woods  SVR = 3140  FA sat = 95%  PA sat = 31%, 32%  Ao Pressure: 121/60 (84)  LV Pressure: 115/12/14  1.17-> 1.52->1.59 -> 1.8 -> 1.5> 1.69>1.23  Feels better on milrinone. Breathing better. No orthopnea. Weight up slightly 147->149  Objective:   Vital Signs:   Temp:  [97.4 F (36.3 C)-98.7 F (37.1 C)] 97.8 F (36.6 C) (01/15 0813) Pulse Rate:  [54-112] 112  (01/15 0813) Resp:  [14-26] 16  (01/15 0810) BP: (93-119)/(40-61) 108/59 mmHg (01/15 0813) SpO2:  [93 %-100 %] 93 % (01/15 0813) FiO2 (%):  [95 %] 95 % (01/15 0813) Weight:  [68 kg (149 lb 14.6 oz)] 68 kg (149 lb 14.6 oz) (01/15 0500) Last BM Date: 07/03/12  Weight change: Filed Weights   07/02/12 0511 07/03/12 0500 07/04/12 0500  Weight: 67.5 kg (148 lb 13 oz) 67.1 kg (147 lb 14.9 oz) 68 kg (149 lb 14.6 oz)    Intake/Output:   Intake/Output Summary (Last 24 hours) at 07/04/12 0934 Last data filed at 07/04/12 0809  Gross per 24 hour  Intake 803.73 ml  Output    750 ml  Net  53.73 ml     Physical Exam: CVP 12 General:  Well appearing. No resp difficulty.  Spanish speaking  HEENT: normal Neck: supple. JVP 10-11 Carotids 2+ bilat; no bruits. No lymphadenopathy or thryomegaly appreciated. Cor: PMI nondisplaced. Irregular  irregular.  No rubs, gallops or murmurs. Lungs: clear Abdomen: soft, nontender, + mildly distended. No hepatosplenomegaly. No bruits or masses. Good bowel sounds. Extremities: no cyanosis, clubbing, rash, tr  L>R TED hose in place. Cath site ok.  Neuro: alert & orientedx3, cranial nerves grossly intact. moves all 4 extremities w/o difficulty. Affect pleasant  Telemetry: afib/flutter 50- 60s  Labs: Basic Metabolic Panel:  Lab 07/04/12 2952 07/03/12 0440 07/02/12 2046 07/02/12 0700 07/01/12 0535 06/30/12 0525  NA 138 137 -- 139 135 137  K 3.5 3.8 -- 4.6 3.8 4.5  CL 102 100 -- 100 96 96  CO2 26 28 -- 30 27 30   GLUCOSE 133* 104* -- 89 95 87  BUN 24* 33* -- 33* 36* 42*  CREATININE 1.23 1.69* 1.46* 1.63* 1.55* --  CALCIUM 8.4 8.8 -- 9.2 -- --  MG -- -- -- -- -- --  PHOS -- -- -- -- -- --    Liver Function Tests: No results found for this basename: AST:5,ALT:5,ALKPHOS:5,BILITOT:5,PROT:5,ALBUMIN:5 in the last 168 hours No results found for this basename: LIPASE:5,AMYLASE:5 in the last 168 hours No results found for this basename: AMMONIA:3 in the last 168 hours  CBC:  Lab 07/04/12 0600 07/03/12 0846 07/02/12 2046  WBC 9.0 9.5 9.8  NEUTROABS -- -- --  HGB 13.9 14.4 15.2  HCT 41.5 43.4 45.2  MCV  93.7 93.1 91.9  PLT 189 204 221    Cardiac Enzymes: No results found for this basename: CKTOTAL:5,CKMB:5,CKMBINDEX:5,TROPONINI:5 in the last 168 hours  BNP: BNP (last 3 results)  Basename 06/22/12 1434 06/11/12 0401 04/04/12 1245  PROBNP 32268.0* 15163.0* 9002.0*        Imaging: Dg Chest 1 View  07/03/2012  *RADIOLOGY REPORT*  Clinical Data: Confirm line placement  CHEST - 1 VIEW  Comparison: 06/22/2012  Findings: Right arm PICC terminates in the lower SVC.  Increased interstitial markings.  No focal consolidation.  No pleural effusion or pneumothorax.  Stable cardiomegaly.  IMPRESSION: Right arm PICC terminates in the lower SVC.   Original Report Authenticated By: Charline Bills, M.D.    Nm Pulmonary Perf And Vent  07/03/2012  *RADIOLOGY REPORT*  Clinical Data: Dyspnea and pulmonary hypertension  NM PULMONARY VENTILATION AND PERFUSION SCANS  Comparison:  Chest radiograph July 03, 2012  Views:  Anterior, posterior, left lateral, right lateral, RPO, LPO, RAO, LAO:  Ventilation perfusion  Radiopharmaceutical: Technetium 17m DTPA:  ventilation; technetium 74m macroaggregated albumin:  Perfusion  Dose: 40.0 mCi:  Ventilation; 3.0 mCi:  perfusion)  Route of administration: Inhalation:  Ventilation; intravenous: Perfusion  Findings: The ventilation study shows essentially homogeneous and symmetric uptake of radiotracer bilaterally.  There are no appreciable perfusion defects.  On the perfusion study, uptake of radiotracer is homogeneous and symmetric bilaterally.  There is no appreciable ventilation/perfusion mismatch.  IMPRESSION:  Essentially normal ventilation and perfusion lung scans.  Very low probability of pulmonary embolus.   Original Report Authenticated By: Bretta Bang, M.D.      Medications:     Scheduled Medications:    . bisacodyl  10 mg Rectal Once  . carvedilol  3.125 mg Oral BID WC  . digoxin  0.0625 mg Oral Daily  . docusate sodium  200 mg Oral BID  . famotidine  40 mg Oral Daily  . fenofibrate  54 mg Oral Daily  . loratadine  10 mg Oral Daily  . polyethylene glycol  17 g Oral Daily  . potassium chloride  40 mEq Oral Once  . sildenafil  20 mg Oral TID  . simvastatin  20 mg Oral QPM  . sodium chloride  10-40 mL Intracatheter Q12H  . sodium chloride  3 mL Intravenous Q12H  . spironolactone  25 mg Oral Daily    Infusions:    . heparin 1,050 Units/hr (07/04/12 0809)  . milrinone 0.375 mcg/kg/min (07/04/12 0035)    PRN Medications: sodium chloride, acetaminophen, bisacodyl, nitroGLYCERIN, ondansetron (ZOFRAN) IV, sodium chloride, sodium chloride   Assessment:   1. Acute on chronic systolic CHF  2. Atrial fibrillation, now with  a controlled VR  3. H/o stroke  4. HTN  5. LBBB 6. Acute renal failure (baseline Cr 1.1-1.3) 7. Severe PAH with cor pulmonale and cardiogenic shock  Plan/Discussion:   Continue milrinone. Titrate sildenafil to 40 TID as tolerated. Volume status mildly elevated. Weight up 2 pounds.Add Spironolactone 25 mg daily. Resume lasix 40 daily in am. Plan repeat RHC Friday.   Supplement potassium.  VQ low probability for pulmonary embolus.   Continue Heparin for AF. Start coumadin load.   Cardiac rehab to see.  Prognosis very poor. Would hold off on ICD for now. Would consider LifeVest on D/C. Case management to see for help obtaining Revatio (40 tid) or Adcirca (40 daily) on d/c.   Length of Stay: 12 Daniel Bensimhon,MD 9:42 AM

## 2012-07-04 NOTE — Progress Notes (Signed)
CARDIAC REHAB PHASE I   PRE:  Rate/Rhythm: 80 afib    BP: sitting 99/79    SaO2: 97 RA  MODE:  Ambulation: 740 ft   POST:  Rate/Rhythm: 90 afib    BP: sitting 97/59     SaO2: 93 RA  Steady, denies SOB. Sts he feels much better. SaO2 89 Ra while walking for a brief moment, mostly around 93. Talkative while walking. Return to recliner.  [id number]  Harriet Masson CES, ACSM

## 2012-07-04 NOTE — Progress Notes (Signed)
ANTICOAGULATION CONSULT NOTE - Follow Up Consult  Pharmacy Consult for coumadin, heparin Indication: atrial fibrillation and LV thrombus  No Known Allergies  Patient Measurements: Height: 5\' 8"  (172.7 cm) Weight: 149 lb 14.6 oz (68 kg) IBW/kg (Calculated) : 68.4   Vital Signs: Temp: 97.8 F (36.6 C) (01/15 0813) Temp src: Oral (01/15 0813) BP: 108/59 mmHg (01/15 0813) Pulse Rate: 112  (01/15 0813)  Labs:  Basename 07/04/12 0600 07/04/12 0420 07/03/12 1703 07/03/12 0846 07/03/12 0440 07/02/12 2046 07/02/12 0954  HGB 13.9 -- -- 14.4 -- -- --  HCT 41.5 -- -- 43.4 -- 45.2 --  PLT 189 -- -- 204 -- 221 --  APTT -- -- 47* -- -- -- --  LABPROT -- -- -- -- -- -- 18.1*  INR -- -- -- -- -- -- 1.55*  HEPARINUNFRC -- 0.50 0.27* -- -- -- --  CREATININE 1.23 -- -- -- 1.69* 1.46* --  CKTOTAL -- -- -- -- -- -- --  CKMB -- -- -- -- -- -- --  TROPONINI -- -- -- -- -- -- --    Estimated Creatinine Clearance: 48.4 ml/min (by C-G formula based on Cr of 1.23).   Assessment: 79 year old male with PAF and LV clot found on TEE. Patient at goal on heparin (HL= 0.5) and to start coumadin today.  He was on coumadin PTA and had INRs > 3 this admission (prior to Xarelto therapy).  Goal of Therapy:  Heparin level 0.3-0.7 Monitor platelets by anticoagulation protocol: Yes   Plan:  -No heparin changes needed -Coumadin 4mg  today -Daily PT/INR, heparin level and CBC  Harland German, Pharm D 07/04/2012 10:03 AM

## 2012-07-04 NOTE — Progress Notes (Signed)
ANTICOAGULATION CONSULT NOTE - Follow Up Consult  Pharmacy Consult for Xarelto>>heparin Indication: atrial fibrillation and LV thrombus  No Known Allergies  Patient Measurements: Height: 5\' 8"  (172.7 cm) Weight: 147 lb 14.9 oz (67.1 kg) IBW/kg (Calculated) : 68.4   Vital Signs: Temp: 97.6 F (36.4 C) (01/15 0403) Temp src: Oral (01/15 0403) BP: 107/50 mmHg (01/15 0346) Pulse Rate: 54  (01/15 0346)  Labs:  Basename 07/04/12 0420 07/03/12 1703 07/03/12 0846 07/03/12 0440 07/02/12 2046 07/02/12 0954 07/02/12 0700  HGB -- -- 14.4 -- 15.2 -- --  HCT -- -- 43.4 -- 45.2 -- --  PLT -- -- 204 -- 221 -- --  APTT -- 47* -- -- -- -- --  LABPROT -- -- -- -- -- 18.1* --  INR -- -- -- -- -- 1.55* --  HEPARINUNFRC 0.50 0.27* -- -- -- -- --  CREATININE -- -- -- 1.69* 1.46* -- 1.63*  CKTOTAL -- -- -- -- -- -- --  CKMB -- -- -- -- -- -- --  TROPONINI -- -- -- -- -- -- --    Estimated Creatinine Clearance: 34.7 ml/min (by C-G formula based on Cr of 1.69).   Medications:  Xarelto 15mg  stopped 1/12  Assessment: 79 year old male with PAF and LV clot found on TEE last week. Patient taken for RHC  revealing low cardiac output and elevated pulmonary pressures. He was then started on milrinone to increase cardiac output. Xarelto was stopped prior to cath and sq heparin was resumed post cath x2 doses. Full anticoagulation resumed with IV heparin given clot. May transition back to xarelto once procedures are completed.  Heparin level 0.5 units/ml   No bleeding noted.  Goal of Therapy:  Heparin level 0.3-0.7 Monitor platelets by anticoagulation protocol: Yes   Plan:  1. Continue heparin at 1050 units/hr 2. F/u daily labs   Talbert Cage, PharmD Clinical pharmacist, pager [phone] 07/04/2012,5:04 AM

## 2012-07-05 LAB — BASIC METABOLIC PANEL
BUN: 23 mg/dL (ref 6–23)
Chloride: 102 mEq/L (ref 96–112)
GFR calc Af Amer: 57 mL/min — ABNORMAL LOW (ref >90)
GFR calc non Af Amer: 49 mL/min — ABNORMAL LOW (ref >90)
Potassium: 4 mEq/L (ref 3.5–5.1)
Sodium: 138 mEq/L (ref 135–145)

## 2012-07-05 LAB — CARBOXYHEMOGLOBIN
Carboxyhemoglobin: 1.4 % (ref 0.5–1.5)
O2 Saturation: 67.3 %
Total hemoglobin: 13.3 g/dL — ABNORMAL LOW (ref 13.5–18.0)

## 2012-07-05 LAB — CBC
Hemoglobin: 13.2 g/dL (ref 13.0–17.0)
MCH: 31.1 pg (ref 26.0–34.0)
MCHC: 33.4 g/dL (ref 30.0–36.0)
RDW: 14.1 % (ref 11.5–15.5)

## 2012-07-05 LAB — HEPARIN LEVEL (UNFRACTIONATED): Heparin Unfractionated: 0.3 IU/mL (ref 0.30–0.70)

## 2012-07-05 MED ORDER — TADALAFIL (PAH) 20 MG PO TABS
40.0000 mg | ORAL_TABLET | Freq: Every day | ORAL | Status: DC
  Administered 2012-07-05 – 2012-07-10 (×6): 40 mg via ORAL
  Filled 2012-07-05 (×6): qty 2

## 2012-07-05 MED ORDER — WARFARIN SODIUM 4 MG PO TABS
4.0000 mg | ORAL_TABLET | Freq: Once | ORAL | Status: AC
  Administered 2012-07-05: 4 mg via ORAL
  Filled 2012-07-05: qty 1

## 2012-07-05 MED ORDER — SODIUM CHLORIDE 0.9 % IJ SOLN: 3.0000 mL | INTRAMUSCULAR | Status: DC | PRN

## 2012-07-05 MED ORDER — SODIUM CHLORIDE 0.9 % IJ SOLN: 3.0000 mL | Freq: Two times a day (BID) | INTRAMUSCULAR | Status: DC

## 2012-07-05 MED ORDER — HEPARIN (PORCINE) IN NACL 100-0.45 UNIT/ML-% IJ SOLN
1300.0000 [IU]/h | INTRAMUSCULAR | Status: DC
  Administered 2012-07-05: 1150 [IU]/h via INTRAVENOUS
  Filled 2012-07-05 (×2): qty 250

## 2012-07-05 MED ORDER — SODIUM CHLORIDE 0.9 % IV SOLN: 250.0000 mL | INTRAVENOUS | Status: DC | PRN

## 2012-07-05 NOTE — Progress Notes (Signed)
Interpreter Wyvonnia Dusky for Consent Form RN Ether Griffins

## 2012-07-05 NOTE — Progress Notes (Signed)
CARDIAC REHAB PHASE I   PRE:  Rate/Rhythm: 87 afib    BP: sitting 109/60    SaO2: 100 RA  MODE:  Ambulation: 1040 ft   POST:  Rate/Rhythm: 88 afib    BP: sitting 97/56     SaO2: 99 RA  Tolerated very well. No c/o. x1 rest. Doesn't like to put feet up. [id number]  Harriet Masson CES, ACSM

## 2012-07-05 NOTE — Progress Notes (Signed)
Advanced Heart Failure Rounding Note  Primary Cardiologist: Brackbill  Subjective:    79 y.o. [name] w/ PMHx significant for Chronic Systolic CHF (EF 40%), NICM (normal coronary anatomy 2005), LBBB, PAF (on coumadin), HTN, HLD, CVA, and Seizure (2/2 CVA 05/2012).  Spanish speaking with some understanding of English.  Admitted with ADHF. TEE Friday with LAA clot so no DC-CV. Amio stopped. Milrinone stopped  07/01/12. 1/13//14 Milrinone and Revatio started post cath  due to pulmonary hypertension. 07/03/12 VQ scan low probability for pulmonary embolus. Yesterday sildenafil increased to 40 mg tid and spironolactone 25 mg daily added.   07/02/12 RHC/LHC RA = 15  RV = 94/10/13  PA = 93/42 (59)  PCW = 15  Fick cardiac output/index = 1.9/1.1  PVR = 23.0 Woods  SVR = 3140  FA sat = 95%  PA sat = 31%, 32%  Ao Pressure: 121/60 (84)  LV Pressure: 115/12/14  1.17-> 1.52->1.59 -> 1.8 -> 1.5> 1.69>1.23>1.35  Feels better on milrinone. Breathing better. No orthopnea.   Objective:   Vital Signs:   Temp:  [97.5 F (36.4 C)-98.4 F (36.9 C)] 97.8 F (36.6 C) (01/16 0337) Pulse Rate:  [67-79] 69  (01/16 0337) Resp:  [20-26] 26  (01/16 0337) BP: (94-123)/(38-63) 112/44 mmHg (01/16 0337) SpO2:  [91 %-98 %] 97 % (01/16 0337) Weight:  [68.9 kg (151 lb 14.4 oz)] 68.9 kg (151 lb 14.4 oz) (01/16 0700) Last BM Date: 07/04/12  Weight change: Filed Weights   07/03/12 0500 07/04/12 0500 07/05/12 0700  Weight: 67.1 kg (147 lb 14.9 oz) 68 kg (149 lb 14.6 oz) 68.9 kg (151 lb 14.4 oz)    Intake/Output:   Intake/Output Summary (Last 24 hours) at 07/05/12 0859 Last data filed at 07/05/12 0600  Gross per 24 hour  Intake 1317.2 ml  Output    650 ml  Net  667.2 ml     Physical Exam: CVP 5-6 General:  Well appearing. No resp difficulty.  Spanish speaking  HEENT: normal Neck: supple. JVP 5-6 Carotids 2+ bilat; no bruits. No lymphadenopathy or thryomegaly appreciated. Cor: PMI nondisplaced.  Irregular irregular.  No rubs, gallops or murmurs. Lungs: clear Abdomen: soft, nontender, + mildly distended. No hepatosplenomegaly. No bruits or masses. Good bowel sounds. Extremities: no cyanosis, clubbing, rash, tr  L>R TED hose in place. Cath site ok.  Neuro: alert & orientedx3, cranial nerves grossly intact. moves all 4 extremities w/o difficulty. Affect pleasant  Telemetry: afib/flutter 50- 60s  Labs: Basic Metabolic Panel:  Lab 07/05/12 9811 07/04/12 0600 07/03/12 0440 07/02/12 2046 07/02/12 0700 07/01/12 0535  NA 138 138 137 -- 139 135  K 4.0 3.5 3.8 -- 4.6 3.8  CL 102 102 100 -- 100 96  CO2 26 26 28  -- 30 27  GLUCOSE 100* 133* 104* -- 89 95  BUN 23 24* 33* -- 33* 36*  CREATININE 1.35 1.23 1.69* 1.46* 1.63* --  CALCIUM 8.4 8.4 8.8 -- -- --  MG -- -- -- -- -- --  PHOS -- -- -- -- -- --    Liver Function Tests: No results found for this basename: AST:5,ALT:5,ALKPHOS:5,BILITOT:5,PROT:5,ALBUMIN:5 in the last 168 hours No results found for this basename: LIPASE:5,AMYLASE:5 in the last 168 hours No results found for this basename: AMMONIA:3 in the last 168 hours  CBC:  Lab 07/05/12 0445 07/04/12 0600 07/03/12 0846 07/02/12 2046  WBC 8.9 9.0 9.5 9.8  NEUTROABS -- -- -- --  HGB 13.2 13.9 14.4 15.2  HCT 39.5 41.5 43.4  45.2  MCV 92.9 93.7 93.1 91.9  PLT 194 189 204 221    Cardiac Enzymes: No results found for this basename: CKTOTAL:5,CKMB:5,CKMBINDEX:5,TROPONINI:5 in the last 168 hours  BNP: BNP (last 3 results)  Basename 06/22/12 1434 06/11/12 0401 04/04/12 1245  PROBNP 32268.0* 15163.0* 9002.0*        Imaging: Dg Chest 1 View  07/03/2012  *RADIOLOGY REPORT*  Clinical Data: Confirm line placement  CHEST - 1 VIEW  Comparison: 06/22/2012  Findings: Right arm PICC terminates in the lower SVC.  Increased interstitial markings.  No focal consolidation.  No pleural effusion or pneumothorax.  Stable cardiomegaly.  IMPRESSION: Right arm PICC terminates in the lower SVC.    Original Report Authenticated By: Charline Bills, M.D.    Nm Pulmonary Perf And Vent  07/03/2012  *RADIOLOGY REPORT*  Clinical Data: Dyspnea and pulmonary hypertension  NM PULMONARY VENTILATION AND PERFUSION SCANS  Comparison:  Chest radiograph July 03, 2012  Views:  Anterior, posterior, left lateral, right lateral, RPO, LPO, RAO, LAO:  Ventilation perfusion  Radiopharmaceutical: Technetium 99m DTPA:  ventilation; technetium 45m macroaggregated albumin:  Perfusion  Dose: 40.0 mCi:  Ventilation; 3.0 mCi:  perfusion)  Route of administration: Inhalation:  Ventilation; intravenous: Perfusion  Findings: The ventilation study shows essentially homogeneous and symmetric uptake of radiotracer bilaterally.  There are no appreciable perfusion defects.  On the perfusion study, uptake of radiotracer is homogeneous and symmetric bilaterally.  There is no appreciable ventilation/perfusion mismatch.  IMPRESSION:  Essentially normal ventilation and perfusion lung scans.  Very low probability of pulmonary embolus.   Original Report Authenticated By: Bretta Bang, M.D.      Medications:     Scheduled Medications:    . bisacodyl  10 mg Rectal Once  . carvedilol  3.125 mg Oral BID WC  . digoxin  0.0625 mg Oral Daily  . docusate sodium  200 mg Oral BID  . famotidine  40 mg Oral Daily  . fenofibrate  54 mg Oral Daily  . furosemide  40 mg Oral Daily  . loratadine  10 mg Oral Daily  . polyethylene glycol  17 g Oral Daily  . simvastatin  20 mg Oral QPM  . sodium chloride  10-40 mL Intracatheter Q12H  . sodium chloride  3 mL Intravenous Q12H  . spironolactone  25 mg Oral Daily  . Tadalafil (PAH)  40 mg Oral Daily  . Warfarin - Pharmacist Dosing Inpatient   Does not apply q1800    Infusions:    . heparin 1,050 Units/hr (07/04/12 2129)  . milrinone 0.375 mcg/kg/min (07/04/12 1733)    PRN Medications: sodium chloride, acetaminophen, bisacodyl, ondansetron (ZOFRAN) IV, sodium chloride, sodium  chloride   Assessment:   1. Acute on chronic systolic CHF  2. Atrial fibrillation, now with a controlled VR  3. H/o stroke  4. HTN  5. LBBB 6. Acute renal failure (baseline Cr 1.1-1.3) 7. Severe PAH with cor pulmonale and cardiogenic shock  Plan/Discussion:    Cath reveals severe PAH with cor pulmonale in setting of severe LV dysfunction and chronically elevated L-sided pressures. PCWP 15.  Much improved on milrinone and PDE-5 inhibitor. Will switch from sildenafil to adcirca 40 mg daily due to insurance purposes. Resume lasix 40 daily today.  Plan repeat RHC tomorrow.     Continue Heparin for AF. Start coumadin load.   Cardiac rehab following..  Prognosis very poor. Would hold off on ICD for now. Would consider LifeVest on D/C.   Length of Stay: 34 Makesha Belitz  Ascencion Coye,MD 9:02 AM

## 2012-07-05 NOTE — Progress Notes (Signed)
ANTICOAGULATION CONSULT NOTE - Follow Up Consult  Pharmacy Consult for coumadin, heparin Indication: atrial fibrillation and LV thrombus  No Known Allergies  Patient Measurements: Height: 5\' 8"  (172.7 cm) Weight: 151 lb 14.4 oz (68.9 kg) IBW/kg (Calculated) : 68.4   Vital Signs: Temp: 97.8 F (36.6 C) (01/16 0337) Temp src: Oral (01/16 0337) BP: 120/51 mmHg (01/16 0706) Pulse Rate: 72  (01/16 0706)  Labs:  Basename 07/05/12 0445 07/04/12 0600 07/04/12 0420 07/03/12 1703 07/03/12 0846 07/03/12 0440 07/02/12 0954  HGB 13.2 13.9 -- -- -- -- --  HCT 39.5 41.5 -- -- 43.4 -- --  PLT 194 189 -- -- 204 -- --  APTT -- -- -- 47* -- -- --  LABPROT 15.7* -- -- -- -- -- 18.1*  INR 1.28 -- -- -- -- -- 1.55*  HEPARINUNFRC 0.30 -- 0.50 0.27* -- -- --  CREATININE 1.35 1.23 -- -- -- 1.69* --  CKTOTAL -- -- -- -- -- -- --  CKMB -- -- -- -- -- -- --  TROPONINI -- -- -- -- -- -- --    Estimated Creatinine Clearance: 44.3 ml/min (by C-G formula based on Cr of 1.35).   Assessment: 79 year old male with PAF and LV clot found on TEE. Patient at goal on heparin (HL= 0.3) and on coumadin with INR=1.28  today.  He was on coumadin PTA and had INRs > 3 this admission (prior to Xarelto therapy).  Goal of Therapy:  Heparin level 0.3-0.7 Monitor platelets by anticoagulation protocol: Yes   Plan:  -Increase heparin to 1150 units\/hr -Coumadin 4mg  today -Daily PT/INR, heparin level and CBC  Harland German, Pharm D 07/05/2012 9:43 AM

## 2012-07-06 ENCOUNTER — Encounter (HOSPITAL_COMMUNITY)
Admission: EM | Disposition: A | Discharge status: Home / Self Care | Payer: Self-pay | Source: Home / Self Care | Attending: Internal Medicine

## 2012-07-06 DIAGNOSIS — I509 Heart failure, unspecified: Secondary | ICD-10-CM

## 2012-07-06 HISTORY — PX: RIGHT HEART CATHETERIZATION: SHX5447

## 2012-07-06 LAB — CBC
HCT: 29.7 % — ABNORMAL LOW (ref 39.0–52.0)
MCH: 30.9 pg (ref 26.0–34.0)
MCHC: 33.3 g/dL (ref 30.0–36.0)
Platelets: 147 10*3/uL — ABNORMAL LOW (ref 150–400)
Platelets: 188 10*3/uL (ref 150–400)
RBC: 3.18 MIL/uL — ABNORMAL LOW (ref 4.22–5.81)
RDW: 14.2 % (ref 11.5–15.5)
WBC: 6.3 10*3/uL (ref 4.0–10.5)

## 2012-07-06 LAB — POCT I-STAT 3, ART BLOOD GAS (G3+)
Bicarbonate: 17.1 mEq/L — ABNORMAL LOW (ref 20.0–24.0)
pCO2 arterial: 33.1 mmHg — ABNORMAL LOW (ref 35.0–45.0)
pH, Arterial: 7.321 — ABNORMAL LOW (ref 7.350–7.450)
pO2, Arterial: 64 mmHg — ABNORMAL LOW (ref 80.0–100.0)

## 2012-07-06 LAB — PROTIME-INR
INR: 1.58 — ABNORMAL HIGH (ref 0.00–1.49)
Prothrombin Time: 18.4 seconds — ABNORMAL HIGH (ref 11.6–15.2)

## 2012-07-06 LAB — POCT I-STAT 3, VENOUS BLOOD GAS (G3P V)
Acid-base deficit: 3 mmol/L — ABNORMAL HIGH (ref 0.0–2.0)
Bicarbonate: 22.4 mEq/L (ref 20.0–24.0)
Bicarbonate: 26.1 mEq/L — ABNORMAL HIGH (ref 20.0–24.0)
O2 Saturation: 49 %
O2 Saturation: 56 %
TCO2: 26 mmol/L (ref 0–100)
TCO2: 27 mmol/L (ref 0–100)
pCO2, Ven: 39.1 mmHg — ABNORMAL LOW (ref 45.0–50.0)
pCO2, Ven: 40.2 mmHg — ABNORMAL LOW (ref 45.0–50.0)
pH, Ven: 7.396 — ABNORMAL HIGH (ref 7.250–7.300)
pO2, Ven: 27 mmHg — CL (ref 30.0–45.0)

## 2012-07-06 LAB — BASIC METABOLIC PANEL
CO2: 23 mEq/L (ref 19–32)
Calcium: 8.8 mg/dL (ref 8.4–10.5)
Chloride: 101 mEq/L (ref 96–112)
Glucose, Bld: 104 mg/dL — ABNORMAL HIGH (ref 70–99)
Sodium: 137 mEq/L (ref 135–145)

## 2012-07-06 LAB — POCT ACTIVATED CLOTTING TIME: Activated Clotting Time: 149 seconds

## 2012-07-06 LAB — CARBOXYHEMOGLOBIN
O2 Saturation: 73.7 %
Total oxygen content: 39.4 mL/dL — ABNORMAL HIGH (ref 15.0–23.0)

## 2012-07-06 LAB — HEPARIN LEVEL (UNFRACTIONATED): Heparin Unfractionated: 0.52 IU/mL (ref 0.30–0.70)

## 2012-07-06 SURGERY — RIGHT HEART CATH
Anesthesia: LOCAL

## 2012-07-06 MED ORDER — LIDOCAINE HCL (PF) 1 % IJ SOLN
INTRAMUSCULAR | Status: AC
  Filled 2012-07-06: qty 30

## 2012-07-06 MED ORDER — ONDANSETRON HCL 4 MG/2ML IJ SOLN: 4.0000 mg | Freq: Four times a day (QID) | INTRAMUSCULAR | Status: DC | PRN

## 2012-07-06 MED ORDER — ACETAMINOPHEN 325 MG PO TABS: 650.0000 mg | ORAL_TABLET | ORAL | Status: DC | PRN

## 2012-07-06 MED ORDER — SODIUM CHLORIDE 0.9 % IJ SOLN: 3.0000 mL | Freq: Two times a day (BID) | INTRAMUSCULAR | Status: DC

## 2012-07-06 MED ORDER — HEPARIN (PORCINE) IN NACL 100-0.45 UNIT/ML-% IJ SOLN
1150.0000 [IU]/h | INTRAMUSCULAR | Status: DC
  Administered 2012-07-06: 1300 [IU]/h via INTRAVENOUS
  Administered 2012-07-07: 1350 [IU]/h via INTRAVENOUS
  Administered 2012-07-09 – 2012-07-10 (×2): 1150 [IU]/h via INTRAVENOUS
  Filled 2012-07-06 (×6): qty 250

## 2012-07-06 MED ORDER — WARFARIN SODIUM 4 MG PO TABS
4.0000 mg | ORAL_TABLET | Freq: Once | ORAL | Status: AC
  Administered 2012-07-06: 4 mg via ORAL
  Filled 2012-07-06: qty 1

## 2012-07-06 MED ORDER — HEPARIN (PORCINE) IN NACL 2-0.9 UNIT/ML-% IJ SOLN
INTRAMUSCULAR | Status: AC
  Filled 2012-07-06: qty 500

## 2012-07-06 MED ORDER — SODIUM CHLORIDE 0.9 % IJ SOLN: 3.0000 mL | INTRAMUSCULAR | Status: DC | PRN

## 2012-07-06 MED ORDER — SODIUM CHLORIDE 0.9 % IV SOLN: 250.0000 mL | INTRAVENOUS | Status: DC | PRN

## 2012-07-06 MED ORDER — FUROSEMIDE 10 MG/ML IJ SOLN
40.0000 mg | Freq: Two times a day (BID) | INTRAMUSCULAR | Status: AC
  Administered 2012-07-06 (×2): 40 mg via INTRAVENOUS
  Filled 2012-07-06 (×2): qty 4

## 2012-07-06 MED ORDER — WARFARIN SODIUM 4 MG PO TABS
4.0000 mg | ORAL_TABLET | Freq: Once | ORAL | Status: DC
  Filled 2012-07-06: qty 1

## 2012-07-06 NOTE — Progress Notes (Signed)
  Cardiac cath today with marked improvement in hemodynamics.  Plan will be to diurese with IV lasix over the next 24-48 hours as BP tolerates. Switch to lasix 40 po bid on Saturday or Sunday.   Will be fitted for LifeVest tonight. Suspect d/c Sunday with [facility] and close f/u in HF clinic as scheduled.  See preliminary d/ summary in Incomplete Notes section for full details.   Call me over the weekend with questions [phone].   Erasmus Bistline,MD 4:17 PM

## 2012-07-06 NOTE — Progress Notes (Signed)
Interpreter Wyvonnia Dusky for [facility],

## 2012-07-06 NOTE — Interval H&P Note (Signed)
History and Physical Interval Note:  07/06/2012 7:45 AM  Tyler Christensen  has presented today for surgery, with the diagnosis of chf and pah  The various methods of treatment have been discussed with the patient and family. After consideration of risks, benefits and other options for treatment, the patient has consented to  Procedure(s) (LRB) with comments: RIGHT HEART CATH (N/A) as a surgical intervention .  The patient's history has been reviewed, patient examined, no change in status, stable for surgery.  I have reviewed the patient's chart and labs.  Questions were answered to the patient's satisfaction.     Sheva Mcdougle

## 2012-07-06 NOTE — H&P (View-Only) (Signed)
Advanced Heart Failure Rounding Note  Primary Cardiologist: Brackbill  Subjective:    79 y.o. [place] male w/ PMHx significant for Chronic Systolic CHF (EF 20%), NICM (normal coronary anatomy 2005), LBBB, PAF (on coumadin), HTN, HLD, CVA, and Seizure (2/2 CVA 05/2012).  Spanish speaking with some understanding of English.  Admitted with ADHF. TEE Friday with LAA clot so no DC-CV. Amio stopped. Milrinone stopped  07/01/12. 1/13//14 Milrinone and Revatio started post cath  due to pulmonary hypertension. 07/03/12 VQ scan low probability for pulmonary embolus. Weight up 2 pounds.   07/02/12 RHC/LHC RA = 15  RV = 94/10/13  PA = 93/42 (59)  PCW = 15  Fick cardiac output/index = 1.9/1.1  PVR = 23.0 Woods  SVR = 3140  FA sat = 95%  PA sat = 31%, 32%  Ao Pressure: 121/60 (84)  LV Pressure: 115/12/14  1.17-> 1.52->1.59 -> 1.8 -> 1.5> 1.69>1.23  Feels better on milrinone. Breathing better. No orthopnea. Weight up slightly 147->149  Objective:   Vital Signs:   Temp:  [97.4 F (36.3 C)-98.7 F (37.1 C)] 97.8 F (36.6 C) (01/15 0813) Pulse Rate:  [54-112] 112  (01/15 0813) Resp:  [14-26] 16  (01/15 0810) BP: (93-119)/(40-61) 108/59 mmHg (01/15 0813) SpO2:  [93 %-100 %] 93 % (01/15 0813) FiO2 (%):  [95 %] 95 % (01/15 0813) Weight:  [68 kg (149 lb 14.6 oz)] 68 kg (149 lb 14.6 oz) (01/15 0500) Last BM Date: 07/03/12  Weight change: Filed Weights   07/02/12 0511 07/03/12 0500 07/04/12 0500  Weight: 67.5 kg (148 lb 13 oz) 67.1 kg (147 lb 14.9 oz) 68 kg (149 lb 14.6 oz)    Intake/Output:   Intake/Output Summary (Last 24 hours) at 07/04/12 0934 Last data filed at 07/04/12 0809  Gross per 24 hour  Intake 803.73 ml  Output    750 ml  Net  53.73 ml     Physical Exam: CVP 12 General:  Well appearing. No resp difficulty.  Spanish speaking  HEENT: normal Neck: supple. JVP 10-11 Carotids 2+ bilat; no bruits. No lymphadenopathy or thryomegaly appreciated. Cor: PMI nondisplaced. Irregular  irregular.  No rubs, gallops or murmurs. Lungs: clear Abdomen: soft, nontender, + mildly distended. No hepatosplenomegaly. No bruits or masses. Good bowel sounds. Extremities: no cyanosis, clubbing, rash, tr  L>R TED hose in place. Cath site ok.  Neuro: alert & orientedx3, cranial nerves grossly intact. moves all 4 extremities w/o difficulty. Affect pleasant  Telemetry: afib/flutter 50- 60s  Labs: Basic Metabolic Panel:  Lab 07/04/12 0600 07/03/12 0440 07/02/12 2046 07/02/12 0700 07/01/12 0535 06/30/12 0525  NA 138 137 -- 139 135 137  K 3.5 3.8 -- 4.6 3.8 4.5  CL 102 100 -- 100 96 96  CO2 26 28 -- 30 27 30  GLUCOSE 133* 104* -- 89 95 87  BUN 24* 33* -- 33* 36* 42*  CREATININE 1.23 1.69* 1.46* 1.63* 1.55* --  CALCIUM 8.4 8.8 -- 9.2 -- --  MG -- -- -- -- -- --  PHOS -- -- -- -- -- --    Liver Function Tests: No results found for this basename: AST:5,ALT:5,ALKPHOS:5,BILITOT:5,PROT:5,ALBUMIN:5 in the last 168 hours No results found for this basename: LIPASE:5,AMYLASE:5 in the last 168 hours No results found for this basename: AMMONIA:3 in the last 168 hours  CBC:  Lab 07/04/12 0600 07/03/12 0846 07/02/12 2046  WBC 9.0 9.5 9.8  NEUTROABS -- -- --  HGB 13.9 14.4 15.2  HCT 41.5 43.4 45.2  MCV   93.7 93.1 91.9  PLT 189 204 221    Cardiac Enzymes: No results found for this basename: CKTOTAL:5,CKMB:5,CKMBINDEX:5,TROPONINI:5 in the last 168 hours  BNP: BNP (last 3 results)  Basename 06/22/12 1434 06/11/12 0401 04/04/12 1245  PROBNP 32268.0* 15163.0* 9002.0*        Imaging: Dg Chest 1 View  07/03/2012  *RADIOLOGY REPORT*  Clinical Data: Confirm line placement  CHEST - 1 VIEW  Comparison: 06/22/2012  Findings: Right arm PICC terminates in the lower SVC.  Increased interstitial markings.  No focal consolidation.  No pleural effusion or pneumothorax.  Stable cardiomegaly.  IMPRESSION: Right arm PICC terminates in the lower SVC.   Original Report Authenticated By: Sriyesh  Krishnan, M.D.    Nm Pulmonary Perf And Vent  07/03/2012  *RADIOLOGY REPORT*  Clinical Data: Dyspnea and pulmonary hypertension  NM PULMONARY VENTILATION AND PERFUSION SCANS  Comparison:  Chest radiograph July 03, 2012  Views:  Anterior, posterior, left lateral, right lateral, RPO, LPO, RAO, LAO:  Ventilation perfusion  Radiopharmaceutical: Technetium 99m DTPA:  ventilation; technetium 99m macroaggregated albumin:  Perfusion  Dose: 40.0 mCi:  Ventilation; 3.0 mCi:  perfusion)  Route of administration: Inhalation:  Ventilation; intravenous: Perfusion  Findings: The ventilation study shows essentially homogeneous and symmetric uptake of radiotracer bilaterally.  There are no appreciable perfusion defects.  On the perfusion study, uptake of radiotracer is homogeneous and symmetric bilaterally.  There is no appreciable ventilation/perfusion mismatch.  IMPRESSION:  Essentially normal ventilation and perfusion lung scans.  Very low probability of pulmonary embolus.   Original Report Authenticated By: William Woodruff, M.D.      Medications:     Scheduled Medications:    . bisacodyl  10 mg Rectal Once  . carvedilol  3.125 mg Oral BID WC  . digoxin  0.0625 mg Oral Daily  . docusate sodium  200 mg Oral BID  . famotidine  40 mg Oral Daily  . fenofibrate  54 mg Oral Daily  . loratadine  10 mg Oral Daily  . polyethylene glycol  17 g Oral Daily  . potassium chloride  40 mEq Oral Once  . sildenafil  20 mg Oral TID  . simvastatin  20 mg Oral QPM  . sodium chloride  10-40 mL Intracatheter Q12H  . sodium chloride  3 mL Intravenous Q12H  . spironolactone  25 mg Oral Daily    Infusions:    . heparin 1,050 Units/hr (07/04/12 0809)  . milrinone 0.375 mcg/kg/min (07/04/12 0035)    PRN Medications: sodium chloride, acetaminophen, bisacodyl, nitroGLYCERIN, ondansetron (ZOFRAN) IV, sodium chloride, sodium chloride   Assessment:   1. Acute on chronic systolic CHF  2. Atrial fibrillation, now with  a controlled VR  3. H/o stroke  4. HTN  5. LBBB 6. Acute renal failure (baseline Cr 1.1-1.3) 7. Severe PAH with cor pulmonale and cardiogenic shock  Plan/Discussion:   Continue milrinone. Titrate sildenafil to 40 TID as tolerated. Volume status mildly elevated. Weight up 2 pounds.Add Spironolactone 25 mg daily. Resume lasix 40 daily in am. Plan repeat RHC Friday.   Supplement potassium.  VQ low probability for pulmonary embolus.   Continue Heparin for AF. Start coumadin load.   Cardiac rehab to see.  Prognosis very poor. Would hold off on ICD for now. Would consider LifeVest on D/C. Case management to see for help obtaining Revatio (40 tid) or Adcirca (40 daily) on d/c.   Length of Stay: 12 Kaisyn Reinhold,MD 9:42 AM    

## 2012-07-06 NOTE — Progress Notes (Signed)
ANTICOAGULATION CONSULT NOTE - Follow Up Consult  Pharmacy Consult for heparin Indication: atrial fibrillation and LV thrombus  Labs:  Basename 07/06/12 0411 07/05/12 0445 07/04/12 0600 07/04/12 0420 07/03/12 1703  HGB 9.9* 13.2 -- -- --  HCT 29.7* 39.5 41.5 -- --  PLT 147* 194 189 -- --  APTT -- -- -- -- 47*  LABPROT 18.4* 15.7* -- -- --  INR 1.58* 1.28 -- -- --  HEPARINUNFRC 0.15* 0.30 -- 0.50 --  CREATININE -- 1.35 1.23 -- --  CKTOTAL -- -- -- -- --  CKMB -- -- -- -- --  TROPONINI -- -- -- -- --    Assessment: 79yo male now subtherapeutic on heparin after two levels at goal, had been trending down and rate had been increased; going to cath this am.  Goal of Therapy:  Heparin level 0.3-0.7 units/ml   Plan:  Will increase heparin rate by 2 units/kg/hr to 1300 units/hr and f/u after cath.  [name] PharmD BCPS 07/06/2012,5:18 AM

## 2012-07-06 NOTE — Progress Notes (Addendum)
CRITICAL VALUE ALERT  Critical value received: hgb on coox is 6.5  Date of notification:  07/06/12  Time of notification:  0445  Critical value read back:yes  Nurse who received alert:  Neeley Sedivy, rn  MD notified (1st page):  Dr. Gala Romney  Time of first page:  0545  MD notified (2nd page):  Time of second page:  Responding MD:  Dr. Gala Romney  Time MD responded:  [phone]

## 2012-07-06 NOTE — Progress Notes (Signed)
Tyler Christensen RD, LDN Pager #[phone] After Hours pager #[phone]  

## 2012-07-06 NOTE — Progress Notes (Signed)
CARDIAC REHAB PHASE I   PRE:  Rate/Rhythm: 70 afib    BP: sitting 96/44    SaO2: 91 RA  MODE:  Ambulation: 740 ft   POST:  Rate/Rhythm: 95 afib    BP: sitting 114/51     SaO2: 92 RA  Tolerated well, no c/o. Stiff at first, got better. Gave pt HF booklet in Spanish and reviewed low sodium, daily wts, walking. Voiced understanding.  [id number]  Harriet Masson CES, ACSM

## 2012-07-06 NOTE — Progress Notes (Signed)
Brief Nutrition Note  RD reviewed pt chart based on LOS to assess if nutrition intervention was needed.  Wt trend shows a weight loss of 5 pounds over the last month. BMI of 24.7, normal  Wt Readings from Last 15 Encounters:  07/06/12 162 lb 7.7 oz (73.7 kg)  07/06/12 162 lb 7.7 oz (73.7 kg)  07/06/12 162 lb 7.7 oz (73.7 kg)  07/06/12 162 lb 7.7 oz (73.7 kg)  06/12/12 157 lb 3.2 oz (71.305 kg)    Pt has been eating 75-100% of low sodium diet meals.  Due to current weight and nutrition status, no intervention needed and RD will not continue to follow at this time.  Please consult RD if pt has nutrition needs.    Trenton Gammon Dietetic Intern # [phone]

## 2012-07-06 NOTE — CV Procedure (Signed)
Cardiac Cath Procedure Note:  Indication:  PAH, CHF - assess response to milrinone  Procedures performed:  1) Right heart catheterization  Description of procedure:   The risks and indication of the procedure were explained. Consent was signed and placed on the chart. An appropriate timeout was taken prior to the procedure. The right groin was prepped and draped in the routine sterile fashion and anesthetized with 1% local lidocaine.   A 7 FR venous sheath was placed in the right femoral vein using a modified Seldinger technique. A standard Swan-Ganz catheter was used for the procedure.   Complications: None apparent.  Findings:  RA = 9 RV = 74/1/7 PA = 75/32 (47) PCW = 34 Fick cardiac output/index = 3.9/2.1 PVR = 3.3 Woods FA sat = 92% PA sat = 56%, 56%  Assessment:  1. Moderate PH with profound reduction in PVR with milrinone/PDE-5 inhibitor 2. Elevated left sided filling pressures 3. Normal cardiac output  Plan/Discussion:  He has had a profound response to milrinone and tadalafil. Left sided filling pressures now elevated. Will increase diuretics. Likely can go home over the weekend. Would do f/u RHC in 1 month to further assess if he may be candidate for advanced therapies.  Maleeyah Mccaughey 8:28 AM

## 2012-07-06 NOTE — Progress Notes (Signed)
ANTICOAGULATION CONSULT NOTE - Follow Up Consult  Pharmacy Consult for heparin Indication: atrial fibrillation and LV thrombus  Labs:  Basename 07/06/12 0610 07/06/12 0411 07/05/12 0445 07/04/12 0600 07/03/12 1703  HGB 13.4 9.9* -- -- --  HCT 40.2 29.7* 39.5 -- --  PLT 188 147* 194 -- --  APTT -- -- -- -- 47*  LABPROT -- 18.4* 15.7* -- --  INR -- 1.58* 1.28 -- --  HEPARINUNFRC 0.52 0.15* 0.30 -- --  CREATININE 1.31 -- 1.35 1.23 --  CKTOTAL -- -- -- -- --  CKMB -- -- -- -- --  TROPONINI -- -- -- -- --    Admit Complaint: 79 y.o. male admitted 06/22/2012 with CP, SOB, after recent hospitalization.  On warfarin PTA for afib, 4 mg daily, CVA, LV thrombus, pharmacy consulted to dose heparin and warfarin  Assessment Anticoagulation: afib/CVA in December 2013 (4mg  daily). LV thrombus/afib/rule out PE - now with severe pulm htn. S/p  Cath today plan home on milrinone over we, restart heparin and warfarin  Infectious disease: No ABX, afeb, WBC wnl  Cardiovascular: HTN, CHF (EF 20-25%), hyperlipidemia, BBB, CVA: Cleda Daub, lasix (both PO and IV ordered), simva, dig, coreg, tadalafil (start 1/13) .dig1.2 (1/3) > dose dec. BP at goal but low, HR 80s, 1526/1875 (-348), Admit weight 160, Today's weight 151lb, Discharge weight 12/24 157lb. RHC today with PCWP of 34, TEE showed LA clot on 1/10 - dccv postponed until 81month of anticoag   Endocrine: glucose ok  Neurology/MSK: Siezures, no therapy PTA  Nephrology/Urology/Electrolytes: SCr 1.12 Pulmonary: 93% RA, pulm htn Heme/Onc- CBC stable  PTA Medication Issues: All Home Meds Ordered  Best Practices: DVT Prophylaxis: heparin & warfarin  Goal of Therapy:  Heparin level 0.3-0.7 units/ml INR 2-3   Plan:  Sheath out at 0900 will restart heparin at 1500 (6h post sheath out per Dr Gala Romney VO)  at 1300 units/hr and, recheck heparin level 8h after ggt restarts Warfarin 4 mg today  Thank you for allowing pharmacy to be a part of this patients  care team.  Lovenia Kim Pharm.D., BCPS Clinical Pharmacist 07/06/2012 9:42 AM Pager: [phone] Phone: [phone]

## 2012-07-07 ENCOUNTER — Encounter (HOSPITAL_COMMUNITY): Payer: Self-pay

## 2012-07-07 LAB — BASIC METABOLIC PANEL
BUN: 27 mg/dL — ABNORMAL HIGH (ref 6–23)
CO2: 27 mEq/L (ref 19–32)
Calcium: 8.8 mg/dL (ref 8.4–10.5)
Creatinine, Ser: 1.31 mg/dL (ref 0.50–1.35)
GFR calc Af Amer: 52 mL/min — ABNORMAL LOW (ref >90)
GFR calc non Af Amer: 45 mL/min — ABNORMAL LOW (ref >90)
Glucose, Bld: 141 mg/dL — ABNORMAL HIGH (ref 70–99)
Potassium: 4 mEq/L (ref 3.5–5.1)
Sodium: 137 mEq/L (ref 135–145)

## 2012-07-07 LAB — CBC
MCH: 31.9 pg (ref 26.0–34.0)
MCHC: 33.9 g/dL (ref 30.0–36.0)
MCV: 91.9 fL (ref 78.0–100.0)
Platelets: 201 10*3/uL (ref 150–400)
RDW: 14.2 % (ref 11.5–15.5)
RDW: 14.2 % (ref 11.5–15.5)
WBC: 9.4 10*3/uL (ref 4.0–10.5)

## 2012-07-07 LAB — CARBOXYHEMOGLOBIN
Carboxyhemoglobin: 1.6 % — ABNORMAL HIGH (ref 0.5–1.5)
Methemoglobin: 0.9 % (ref 0.0–1.5)

## 2012-07-07 LAB — PROTIME-INR: INR: 1.35 (ref 0.00–1.49)

## 2012-07-07 MED ORDER — POTASSIUM CHLORIDE CRYS ER 20 MEQ PO TBCR
20.0000 meq | EXTENDED_RELEASE_TABLET | Freq: Every day | ORAL | Status: DC
  Administered 2012-07-07 – 2012-07-10 (×4): 20 meq via ORAL
  Filled 2012-07-07 (×4): qty 1

## 2012-07-07 MED ORDER — WARFARIN SODIUM 6 MG PO TABS
6.0000 mg | ORAL_TABLET | Freq: Once | ORAL | Status: AC
  Administered 2012-07-07: 6 mg via ORAL
  Filled 2012-07-07: qty 1

## 2012-07-07 MED ORDER — FUROSEMIDE 40 MG PO TABS
40.0000 mg | ORAL_TABLET | Freq: Two times a day (BID) | ORAL | Status: DC
  Administered 2012-07-07 – 2012-07-08 (×3): 40 mg via ORAL
  Filled 2012-07-07 (×3): qty 1

## 2012-07-07 MED ORDER — HEPARIN SOD (PORCINE) IN D5W 100 UNIT/ML IV SOLN
INTRAVENOUS | Status: AC
  Administered 2012-07-07: 1350 [IU]/h via INTRAVENOUS
  Filled 2012-07-07: qty 250

## 2012-07-07 NOTE — Progress Notes (Signed)
Heparin level reported this am as 1.97 though this was accidentally run from a sample drawn through central line where heparin is infusing; will continue and recheck to confirm earlier level.  Vernard Gambles, PharmD, BCPS 07/07/2012 6:03 AM

## 2012-07-07 NOTE — Progress Notes (Signed)
ANTICOAGULATION CONSULT NOTE - Follow Up Consult  Pharmacy Consult for heparin and warfarin Indication: atrial fibrillation and LV thrombus  No Known Allergies  Patient Measurements: Height: 5\' 8"  (172.7 cm) Weight: 146 lb 6.2 oz (66.4 kg) IBW/kg (Calculated) : 68.4  Heparin Dosing Weight: 66.4kg  Vital Signs: Temp: 97.4 F (36.3 C) (01/18 0759) Temp src: Oral (01/18 0759) BP: 111/54 mmHg (01/18 0800) Pulse Rate: 78  (01/18 1030)  Labs:  Basename 07/07/12 0850 07/07/12 0500 07/07/12 0139 07/06/12 0610 07/06/12 0411 07/05/12 0445  HGB -- 13.8 -- 13.4 -- --  HCT -- 39.8 -- 40.2 29.7* --  PLT -- 201 -- 188 147* --  APTT -- -- -- -- -- --  LABPROT -- 18.1* 16.4* -- 18.4* --  INR -- 1.55* 1.35 -- 1.58* --  HEPARINUNFRC 0.24* 1.97* 0.41 -- -- --  CREATININE -- 1.31 -- 1.31 -- 1.35  CKTOTAL -- -- -- -- -- --  CKMB -- -- -- -- -- --  TROPONINI -- -- -- -- -- --    Estimated Creatinine Clearance: 44.4 ml/min (by C-G formula based on Cr of 1.31).   Assessment: 59 YOM admitted with CP and SOB on 1/3. History of Afib on warfarin PTA, also has LV thrombus and was started on heparin. Heparin level was therapeutic early this morning, and the recheck was then extremely supratherapeutic. Upon review, the level was drawn from the line where the heparin was infusing. Recheck at 900 was low at 0.24. INR this morning is 1.55 after 3 doses of 4mg  of warfarin.  Goal of Therapy:  INR 2-3 Heparin level 0.3-0.7 units/ml Monitor platelets by anticoagulation protocol: Yes   Plan:  1. Increase heparin slightly to 1350units/hr 2. 8 hour heparin level 3. Warfarin 6mg  x1 tonight 4. Daily CBC, PT/INR, and heparin level  Eldrige Pitkin D. Nargis Abrams, PharmD Clinical Pharmacist Pager: [phone] 07/07/2012 11:24 AM

## 2012-07-07 NOTE — Progress Notes (Signed)
ANTICOAGULATION CONSULT NOTE - Follow Up Consult  Pharmacy Consult for heparin and warfarin Indication: atrial fibrillation and LV thrombus  No Known Allergies  Patient Measurements: Height: 5\' 8"  (172.7 cm) Weight: 146 lb 6.2 oz (66.4 kg) IBW/kg (Calculated) : 68.4  Heparin Dosing Weight: 66.4kg  Vital Signs: Temp: 97.3 F (36.3 C) (01/18 1925) Temp src: Oral (01/18 1925) BP: 119/76 mmHg (01/18 2000) Pulse Rate: 75  (01/18 1745)  Labs:  Basename 07/07/12 1954 07/07/12 0850 07/07/12 0500 07/07/12 0139 07/06/12 0610 07/06/12 0411 07/05/12 0445  HGB -- -- 13.8 -- 13.4 -- --  HCT -- -- 39.8 -- 40.2 29.7* --  PLT -- -- 201 -- 188 147* --  APTT -- -- -- -- -- -- --  LABPROT -- -- 18.1* 16.4* -- 18.4* --  INR -- -- 1.55* 1.35 -- 1.58* --  HEPARINUNFRC 0.57 0.24* 1.97* -- -- -- --  CREATININE -- -- 1.31 -- 1.31 -- 1.35  CKTOTAL -- -- -- -- -- -- --  CKMB -- -- -- -- -- -- --  TROPONINI -- -- -- -- -- -- --    Estimated Creatinine Clearance: 44.4 ml/min (by C-G formula based on Cr of 1.31).   Assessment: 48 YOM admitted with CP and SOB on 1/3. History of Afib on warfarin PTA, also has LV thrombus and was started on heparin. Recheck of HL 0.57 in goal.  Goal of Therapy:  INR 2-3 Heparin level 0.3-0.7 units/ml Monitor platelets by anticoagulation protocol: Yes   Plan:  1. Continue heparin slightly to 1350units/hr 2. Next HL in am.   Lanetra Hartley S. Merilynn Finland, PharmD, [facility] Clinical Staff Pharmacist Pager [phone]  07/07/2012 8:44 PM

## 2012-07-07 NOTE — Progress Notes (Signed)
Patient ID: Tyler Christensen, male   DOB: [birth year], 79 y.o.   MRN: [id number]   Patient Name: Tyler Christensen Date of Encounter: 07/07/2012    SUBJECTIVE  Mr Degroote feels much better today. No shortness of breath chest pain. Does not speak good [place]. CURRENT MEDS    . bisacodyl  10 mg Rectal Once  . carvedilol  3.125 mg Oral BID WC  . digoxin  0.0625 mg Oral Daily  . docusate sodium  200 mg Oral BID  . famotidine  40 mg Oral Daily  . fenofibrate  54 mg Oral Daily  . loratadine  10 mg Oral Daily  . polyethylene glycol  17 g Oral Daily  . simvastatin  20 mg Oral QPM  . sodium chloride  10-40 mL Intracatheter Q12H  . sodium chloride  3 mL Intravenous Q12H  . spironolactone  25 mg Oral Daily  . Tadalafil (PAH)  40 mg Oral Daily  . Warfarin - Pharmacist Dosing Inpatient   Does not apply q1800    OBJECTIVE  Filed Vitals:   07/07/12 0440 07/07/12 0759 07/07/12 0800 07/07/12 1030  BP:   111/54   Pulse:    78  Temp:  97.4 F (36.3 C)    TempSrc:  Oral    Resp:  18    Height:      Weight: 146 lb 6.2 oz (66.4 kg)     SpO2:  95%      Intake/Output Summary (Last 24 hours) at 07/07/12 1057 Last data filed at 07/07/12 0800  Gross per 24 hour  Intake 1465.2 ml  Output   3300 ml  Net -1834.8 ml   Filed Weights   07/05/12 1900 07/06/12 0359 07/07/12 0440  Weight: 160 lb 7.9 oz (72.8 kg) 162 lb 7.7 oz (73.7 kg) 146 lb 6.2 oz (66.4 kg)    PHYSICAL EXAM  General: Pleasant, NAD. Neuro: Alert and oriented X 3. Moves all extremities spontaneously. Psych: Normal affect. HEENT:  Normal  Neck: Supple without bruits or JVD. Lungs:  Resp regular and unlabored, CTA. Heart: RRR no s3, s4, or murmurs. Abdomen: Soft, non-tender, non-distended, BS + x 4.  Extremities: No clubbing, cyanosis or edema. DP/PT/Radials 2+ and equal bilaterally.  Accessory Clinical Findings  CBC  Basename 07/07/12 0500 07/06/12 0610  WBC 9.4 8.4  NEUTROABS -- --  HGB 13.8 13.4  HCT 39.8 40.2  MCV 91.9  92.8  PLT 201 188   Basic Metabolic Panel  Basename 07/07/12 0500 07/06/12 0610  NA 134* 137  K 3.6 3.8  CL 97 101  CO2 27 23  GLUCOSE 141* 104*  BUN 26* 23  CREATININE 1.31 1.31  CALCIUM 8.8 8.8  MG -- --  PHOS -- --   Liver Function Tests No results found for this basename: AST:2,ALT:2,ALKPHOS:2,BILITOT:2,PROT:2,ALBUMIN:2 in the last 72 hours No results found for this basename: LIPASE:2,AMYLASE:2 in the last 72 hours Cardiac Enzymes No results found for this basename: CKTOTAL:3,CKMB:3,CKMBINDEX:3,TROPONINI:3 in the last 72 hours BNP No components found with this basename: POCBNP:3 D-Dimer No results found for this basename: DDIMER:2 in the last 72 hours Hemoglobin A1C No results found for this basename: HGBA1C in the last 72 hours Fasting Lipid Panel No results found for this basename: CHOL,HDL,LDLCALC,TRIG,CHOLHDL,LDLDIRECT in the last 72 hours Thyroid Function Tests No results found for this basename: TSH,T4TOTAL,FREET3,T3FREE,THYROIDAB in the last 72 hours  TELE Chronic atrial fibrillation  ECG    Radiology/Studies  Dg Chest 1 View  07/03/2012  *RADIOLOGY REPORT*  Clinical  Data: Confirm line placement  CHEST - 1 VIEW  Comparison: 06/22/2012  Findings: Right arm PICC terminates in the lower SVC.  Increased interstitial markings.  No focal consolidation.  No pleural effusion or pneumothorax.  Stable cardiomegaly.  IMPRESSION: Right arm PICC terminates in the lower SVC.   Original Report Authenticated By: Charline Bills, M.D.    Dg Chest 2 View  06/22/2012  *RADIOLOGY REPORT*  Clinical Data: Chest pain  CHEST - 2 VIEW  Comparison: 06/11/2012  Findings: Cardiomegaly again noted.  No acute infiltrate or pulmonary edema.  Mild elevation of the right hemidiaphragm. Stable mild degenerative changes thoracic spine.  IMPRESSION: No active disease.  Cardiomegaly again noted.   Original Report Authenticated By: Natasha Mead, M.D.    Ct Head Wo Contrast  06/11/2012  *RADIOLOGY  REPORT*  Clinical Data: Shaking; gasping for air.  Somewhat disoriented.  CT HEAD WITHOUT CONTRAST  Technique:  Contiguous axial images were obtained from the base of the skull through the vertex without contrast.  Comparison: None.  Findings: There is no evidence of acute infarction, mass lesion, or intra- or extra-axial hemorrhage on CT.  Prominence of the ventricles and sulci reflects mild cortical volume loss.  Mild cerebellar atrophy is noted.  The brainstem and fourth ventricle are within normal limits.  The basal ganglia are unremarkable in appearance.  The cerebral hemispheres demonstrate grossly normal gray-white differentiation. No mass effect or midline shift is seen.  There is no evidence of fracture; visualized osseous structures are unremarkable in appearance.  The visualized portions of the orbits are within normal limits.  Mucosal thickening is noted within the left maxillary sinus; the remaining paranasal sinuses and mastoid air cells are well-aerated.  No significant soft tissue abnormalities are seen.  IMPRESSION:  1.  No acute intracranial pathology seen on CT. 2.  Mild cortical volume loss.   Original Report Authenticated By: Tonia Ghent, M.D.    Mr [facility] Wo Contrast  06/11/2012  *RADIOLOGY REPORT*  Clinical Data:  New onset seizures.  Hypertension and high cholesterol.  MRI BRAIN WITHOUT CONTRAST MRA HEAD WITHOUT CONTRAST  Technique: Multiplanar, multiecho pulse sequences of the brain and surrounding structures were obtained according to standard protocol without intravenous contrast.  Angiographic images of the head were obtained using MRA technique without contrast.  Comparison: 06/11/2012 head CT.  No comparison brain MR.  MRI HEAD  Findings:  Acute non hemorrhagic small infarct posterior right temporal - parietal lobe junction.  Tiny acute non hemorrhagic infarct posterior right corona radiata.  Tiny acute infarct right parietal lobe.  Abnormal flow within the right middle cerebral  artery branches. Please see below.  Global atrophy without hydrocephalus.  No intracranial hemorrhage.  No intracranial mass lesion detected on this unenhanced exam.  Moderate mucosal thickening left maxillary sinus.  Cervical spondylotic changes with spinal stenosis C4-5 with mild cord flattening.  IMPRESSION: Acute non hemorrhagic small infarct posterior right temporal - parietal lobe junction.  Tiny acute non hemorrhagic infarct posterior right corona radiata.  Tiny acute infarct right parietal lobe.  Global atrophy.  C4-5 cervical spondylotic changes with spinal stenosis and mild cord flattening.  Moderate mucosal thickening left maxillary sinus.  MRA HEAD  Findings: Abrupt cut off of flow to large segment of right posterior cerebral artery branches just beyond the right middle cerebral artery bifurcation.  No significant stenosis proximal to this level.  Right vertebral artery is dominant.  No high-grade stenosis of the vertebral arteries or basilar artery.  Mild narrowing  proximal left PICA.  Poor delineation left AICA.  Slight irregularity superior cerebellar arteries bilaterally.  Mild irregularity posterior cerebral artery branch vessels with decreased visualization left posterior cerebral artery distal branches.  No aneurysm or vascular malformation noted.  IMPRESSION: Abrupt cut off of flow to large segment of right posterior cerebral artery branches just beyond the right middle cerebral artery bifurcation.  Please see above.  This has been made a PRA call report utilizing dashboard call feature.   Original Report Authenticated By: Lacy Duverney, M.D.    Mr Brain Wo Contrast  06/11/2012  *RADIOLOGY REPORT*  Clinical Data:  New onset seizures.  Hypertension and high cholesterol.  MRI BRAIN WITHOUT CONTRAST MRA HEAD WITHOUT CONTRAST  Technique: Multiplanar, multiecho pulse sequences of the brain and surrounding structures were obtained according to standard protocol without intravenous contrast.   Angiographic images of the head were obtained using MRA technique without contrast.  Comparison: 06/11/2012 head CT.  No comparison brain MR.  MRI HEAD  Findings:  Acute non hemorrhagic small infarct posterior right temporal - parietal lobe junction.  Tiny acute non hemorrhagic infarct posterior right corona radiata.  Tiny acute infarct right parietal lobe.  Abnormal flow within the right middle cerebral artery branches. Please see below.  Global atrophy without hydrocephalus.  No intracranial hemorrhage.  No intracranial mass lesion detected on this unenhanced exam.  Moderate mucosal thickening left maxillary sinus.  Cervical spondylotic changes with spinal stenosis C4-5 with mild cord flattening.  IMPRESSION: Acute non hemorrhagic small infarct posterior right temporal - parietal lobe junction.  Tiny acute non hemorrhagic infarct posterior right corona radiata.  Tiny acute infarct right parietal lobe.  Global atrophy.  C4-5 cervical spondylotic changes with spinal stenosis and mild cord flattening.  Moderate mucosal thickening left maxillary sinus.  MRA HEAD  Findings: Abrupt cut off of flow to large segment of right posterior cerebral artery branches just beyond the right middle cerebral artery bifurcation.  No significant stenosis proximal to this level.  Right vertebral artery is dominant.  No high-grade stenosis of the vertebral arteries or basilar artery.  Mild narrowing proximal left PICA.  Poor delineation left AICA.  Slight irregularity superior cerebellar arteries bilaterally.  Mild irregularity posterior cerebral artery branch vessels with decreased visualization left posterior cerebral artery distal branches.  No aneurysm or vascular malformation noted.  IMPRESSION: Abrupt cut off of flow to large segment of right posterior cerebral artery branches just beyond the right middle cerebral artery bifurcation.  Please see above.  This has been made a PRA call report utilizing dashboard call feature.    Original Report Authenticated By: Lacy Duverney, M.D.    Nm Pulmonary Perf And Vent  07/03/2012  *RADIOLOGY REPORT*  Clinical Data: Dyspnea and pulmonary hypertension  NM PULMONARY VENTILATION AND PERFUSION SCANS  Comparison:  Chest radiograph July 03, 2012  Views:  Anterior, posterior, left lateral, right lateral, RPO, LPO, RAO, LAO:  Ventilation perfusion  Radiopharmaceutical: Technetium 67m DTPA:  ventilation; technetium 34m macroaggregated albumin:  Perfusion  Dose: 40.0 mCi:  Ventilation; 3.0 mCi:  perfusion)  Route of administration: Inhalation:  Ventilation; intravenous: Perfusion  Findings: The ventilation study shows essentially homogeneous and symmetric uptake of radiotracer bilaterally.  There are no appreciable perfusion defects.  On the perfusion study, uptake of radiotracer is homogeneous and symmetric bilaterally.  There is no appreciable ventilation/perfusion mismatch.  IMPRESSION:  Essentially normal ventilation and perfusion lung scans.  Very low probability of pulmonary embolus.   Original Report Authenticated By: Bretta Bang,  M.D.    Dg Chest Port 1 View  06/11/2012  *RADIOLOGY REPORT*  Clinical Data: Chest pain and shortness of breath; shaking.  PORTABLE CHEST - 1 VIEW  Comparison: Chest radiograph performed 04/04/2012  Findings: The lungs are well-aerated.  Mild bilateral atelectasis is noted.  There is no evidence of pleural effusion or pneumothorax.  The cardiomediastinal silhouette is enlarged; calcification is noted within the aortic arch.  No acute osseous abnormalities are seen.  IMPRESSION: Mild bilateral atelectasis noted; stable cardiomegaly.   Original Report Authenticated By: Tonia Ghent, M.D.    Dg Abd 2 Views  06/11/2012  *RADIOLOGY REPORT*  Clinical Data: Nausea, vomiting, abdominal pain and distension.  ABDOMEN - 2 VIEW  Comparison: None.  Findings: The visualized bowel gas pattern is unremarkable. Scattered air and stool filled loops of colon are seen; no  abnormal dilatation of small bowel loops is seen to suggest small bowel obstruction.  No free intra-abdominal air is identified, though evaluation for free air is limited on a single supine view.  The visualized osseous structures are within normal limits; the sacroiliac joints are unremarkable in appearance.  Mild left basilar opacity likely reflects atelectasis.  IMPRESSION:  1.  Unremarkable bowel gas pattern; no free intra-abdominal air seen. 2.  Mild left basilar airspace opacity likely reflects atelectasis.   Original Report Authenticated By: Tonia Ghent, M.D.    Dg Abd Portable 1v  06/22/2012  *RADIOLOGY REPORT*  Clinical Data: Constipation.  PORTABLE ABDOMEN - 1 VIEW  Comparison: 06/11/2012.  Findings: Minimally dilated small bowel loops in the mid abdomen. Normal caliber colon.  Moderate amount of stool in the rectum with smaller amounts of stool elsewhere in the colon.  Lumbar spine degenerative changes.  IMPRESSION:  1.  Minimal small bowel ileus or partial obstruction. 2.  Moderate amount of stool in the rectum with a lesser amount of stool elsewhere in the colon.   Original Report Authenticated By: Beckie Salts, M.D.     ASSESSMENT AND PLAN  Active Problems:  Acute On Chronic systolic congestive heart failure, NYHA class 2 EF 20 % 12.23.2013  HTN (hypertension)  Seizure  multiple right brain embolic strokes  Atrial fibrillation  SOB (shortness of breath)  Left bundle branch block  NICM (nonischemic cardiomyopathy)  HLD (hyperlipidemia)  Acute on chronic systolic heart failure  ARF (acute renal failure)  PAH (pulmonary artery hypertension)  Acute cor pulmonale   He is doing better with good diuresis. I do not believe the weights. We'll change to by mouth Lasix today. Followup renal function which is currently stable. Plan discharge with advanced home care and careful followup in the heart failure clinic early next week.  Signed, Valera Castle MD

## 2012-07-07 NOTE — Progress Notes (Signed)
ANTICOAGULATION CONSULT NOTE - Follow Up Consult  Pharmacy Consult for heparin Indication: atrial fibrillation and LV thrombus  Labs:  Basename 07/07/12 0139 07/06/12 0610 07/06/12 0411 07/05/12 0445 07/04/12 0600  HGB -- 13.4 9.9* -- --  HCT -- 40.2 29.7* 39.5 --  PLT -- 188 147* 194 --  APTT -- -- -- -- --  LABPROT 16.4* -- 18.4* 15.7* --  INR 1.35 -- 1.58* 1.28 --  HEPARINUNFRC 0.41 0.52 0.15* -- --  CREATININE -- 1.31 -- 1.35 1.23  CKTOTAL -- -- -- -- --  CKMB -- -- -- -- --  TROPONINI -- -- -- -- --    Assessment/Plan:  79yo male therapeutic on heparin after resumed post cath.  Will continue gtt at current rate and confirm stable with additional level.  [name] PharmD BCPS 07/07/2012,2:35 AM

## 2012-07-07 NOTE — Progress Notes (Signed)
Phase I Cardiac Rehab  Arrived to pt room to ambulate.  Pt working with [employer] conservation officer and interpreter for home care life[employer] instructions.  Will f/u on Monday

## 2012-07-07 NOTE — Progress Notes (Signed)
Engineer, technical sales here to meet w/ [employer] Rep and Mr. Revolorio.  Mr. Gwenyth Allegra (interpreter) needs proof sent to his company that he was here to get paid. Ph. [phone]. See card in shadow chart taped to [employer] order form.

## 2012-07-08 LAB — BASIC METABOLIC PANEL
Calcium: 9.5 mg/dL (ref 8.4–10.5)
GFR calc non Af Amer: 44 mL/min — ABNORMAL LOW (ref >90)
Sodium: 135 mEq/L (ref 135–145)

## 2012-07-08 LAB — CBC
MCH: 31.7 pg (ref 26.0–34.0)
Platelets: 221 10*3/uL (ref 150–400)
RBC: 4.76 MIL/uL (ref 4.22–5.81)
WBC: 9 10*3/uL (ref 4.0–10.5)

## 2012-07-08 LAB — HEPARIN LEVEL (UNFRACTIONATED)
Heparin Unfractionated: 0.73 IU/mL — ABNORMAL HIGH (ref 0.30–0.70)
Heparin Unfractionated: 0.72 IU/mL — ABNORMAL HIGH (ref 0.30–0.70)

## 2012-07-08 LAB — CARBOXYHEMOGLOBIN
Carboxyhemoglobin: 1.3 % (ref 0.5–1.5)
O2 Saturation: 63 %

## 2012-07-08 LAB — PROTIME-INR: Prothrombin Time: 18.3 seconds — ABNORMAL HIGH (ref 11.6–15.2)

## 2012-07-08 MED ORDER — WARFARIN SODIUM 6 MG PO TABS
6.0000 mg | ORAL_TABLET | Freq: Once | ORAL | Status: AC
  Administered 2012-07-08: 6 mg via ORAL
  Filled 2012-07-08: qty 1

## 2012-07-08 MED ORDER — FUROSEMIDE 40 MG PO TABS
40.0000 mg | ORAL_TABLET | Freq: Every day | ORAL | Status: DC
  Administered 2012-07-09 – 2012-07-10 (×2): 40 mg via ORAL
  Filled 2012-07-08 (×2): qty 1

## 2012-07-08 NOTE — Progress Notes (Signed)
Patient ID: Tyler Christensen, male   DOB: [birth year], 79 y.o.   MRN: [id number]   Patient Name: Tyler Christensen Date of Encounter: 07/08/2012    SUBJECTIVE  Breathing much better and comfortable. No complaints. Wants to go home.  CURRENT MEDS    . bisacodyl  10 mg Rectal Once  . carvedilol  3.125 mg Oral BID WC  . digoxin  0.0625 mg Oral Daily  . docusate sodium  200 mg Oral BID  . famotidine  40 mg Oral Daily  . fenofibrate  54 mg Oral Daily  . furosemide  40 mg Oral BID  . loratadine  10 mg Oral Daily  . polyethylene glycol  17 g Oral Daily  . potassium chloride  20 mEq Oral Daily  . simvastatin  20 mg Oral QPM  . sodium chloride  10-40 mL Intracatheter Q12H  . sodium chloride  3 mL Intravenous Q12H  . spironolactone  25 mg Oral Daily  . Tadalafil (PAH)  40 mg Oral Daily  . warfarin  6 mg Oral ONCE-1800  . Warfarin - Pharmacist Dosing Inpatient   Does not apply q1800    OBJECTIVE  Filed Vitals:   07/08/12 0340 07/08/12 0457 07/08/12 0752 07/08/12 1100  BP: 109/48   101/52  Pulse:      Temp: 97.5 F (36.4 C)  97.3 F (36.3 C) 97.5 F (36.4 C)  TempSrc: Oral  Oral Oral  Resp: 18  18 18   Height:      Weight:  145 lb 15.1 oz (66.2 kg)    SpO2: 97%   95%    Intake/Output Summary (Last 24 hours) at 07/08/12 1217 Last data filed at 07/08/12 1308  Gross per 24 hour  Intake 682.28 ml  Output   2500 ml  Net -1817.72 ml   Filed Weights   07/06/12 0359 07/07/12 0440 07/08/12 0457  Weight: 162 lb 7.7 oz (73.7 kg) 146 lb 6.2 oz (66.4 kg) 145 lb 15.1 oz (66.2 kg)    PHYSICAL EXAM  General: Pleasant, NAD. Neuro: Alert and oriented X 3. Moves all extremities spontaneously. Psych: Normal affect. HEENT:  Normal  Neck: Supple without bruits, mild JVD Lungs:  Resp regular and unlabored, CTA. Heart: irregular rate and rhythm no s3, s4, or murmurs. Abdomen: Soft, non-tender, non-distended, BS + x 4.  Extremities: No clubbing, cyanosis or edema. DP/PT/Radials 2+ and equal  bilaterally.  Accessory Clinical Findings  CBC  Basename 07/08/12 0518 07/07/12 0500  WBC 9.0 9.4  NEUTROABS -- --  HGB 15.1 13.8  HCT 43.6 39.8  MCV 91.6 91.9  PLT 221 201   Basic Metabolic Panel  Basename 07/08/12 0518 07/07/12 0500  NA 135 134*  K 4.3 3.6  CL 97 97  CO2 26 27  GLUCOSE 109* 141*  BUN 29* 26*  CREATININE 1.48* 1.31  CALCIUM 9.5 8.8  MG -- --  PHOS -- --   Liver Function Tests No results found for this basename: AST:2,ALT:2,ALKPHOS:2,BILITOT:2,PROT:2,ALBUMIN:2 in the last 72 hours No results found for this basename: LIPASE:2,AMYLASE:2 in the last 72 hours Cardiac Enzymes No results found for this basename: CKTOTAL:3,CKMB:3,CKMBINDEX:3,TROPONINI:3 in the last 72 hours BNP No components found with this basename: POCBNP:3 D-Dimer No results found for this basename: DDIMER:2 in the last 72 hours Hemoglobin A1C No results found for this basename: HGBA1C in the last 72 hours Fasting Lipid Panel No results found for this basename: CHOL,HDL,LDLCALC,TRIG,CHOLHDL,LDLDIRECT in the last 72 hours Thyroid Function Tests No results found for this basename:  TSH,T4TOTAL,FREET3,T3FREE,THYROIDAB in the last 72 hours  TELE  Atrial fibrillation with a well-controlled ventricular rate  ECG    Radiology/Studies  Dg Chest 1 View  07/03/2012  *RADIOLOGY REPORT*  Clinical Data: Confirm line placement  CHEST - 1 VIEW  Comparison: 06/22/2012  Findings: Right arm PICC terminates in the lower SVC.  Increased interstitial markings.  No focal consolidation.  No pleural effusion or pneumothorax.  Stable cardiomegaly.  IMPRESSION: Right arm PICC terminates in the lower SVC.   Original Report Authenticated By: Charline Bills, M.D.    Dg Chest 2 View  06/22/2012  *RADIOLOGY REPORT*  Clinical Data: Chest pain  CHEST - 2 VIEW  Comparison: 06/11/2012  Findings: Cardiomegaly again noted.  No acute infiltrate or pulmonary edema.  Mild elevation of the right hemidiaphragm. Stable  mild degenerative changes thoracic spine.  IMPRESSION: No active disease.  Cardiomegaly again noted.   Original Report Authenticated By: Natasha Mead, M.D.    Ct Head Wo Contrast  06/11/2012  *RADIOLOGY REPORT*  Clinical Data: Shaking; gasping for air.  Somewhat disoriented.  CT HEAD WITHOUT CONTRAST  Technique:  Contiguous axial images were obtained from the base of the skull through the vertex without contrast.  Comparison: None.  Findings: There is no evidence of acute infarction, mass lesion, or intra- or extra-axial hemorrhage on CT.  Prominence of the ventricles and sulci reflects mild cortical volume loss.  Mild cerebellar atrophy is noted.  The brainstem and fourth ventricle are within normal limits.  The basal ganglia are unremarkable in appearance.  The cerebral hemispheres demonstrate grossly normal gray-white differentiation. No mass effect or midline shift is seen.  There is no evidence of fracture; visualized osseous structures are unremarkable in appearance.  The visualized portions of the orbits are within normal limits.  Mucosal thickening is noted within the left maxillary sinus; the remaining paranasal sinuses and mastoid air cells are well-aerated.  No significant soft tissue abnormalities are seen.  IMPRESSION:  1.  No acute intracranial pathology seen on CT. 2.  Mild cortical volume loss.   Original Report Authenticated By: Tonia Ghent, M.D.    Mr [facility] Wo Contrast  06/11/2012  *RADIOLOGY REPORT*  Clinical Data:  New onset seizures.  Hypertension and high cholesterol.  MRI BRAIN WITHOUT CONTRAST MRA HEAD WITHOUT CONTRAST  Technique: Multiplanar, multiecho pulse sequences of the brain and surrounding structures were obtained according to standard protocol without intravenous contrast.  Angiographic images of the head were obtained using MRA technique without contrast.  Comparison: 06/11/2012 head CT.  No comparison brain MR.  MRI HEAD  Findings:  Acute non hemorrhagic small infarct  posterior right temporal - parietal lobe junction.  Tiny acute non hemorrhagic infarct posterior right corona radiata.  Tiny acute infarct right parietal lobe.  Abnormal flow within the right middle cerebral artery branches. Please see below.  Global atrophy without hydrocephalus.  No intracranial hemorrhage.  No intracranial mass lesion detected on this unenhanced exam.  Moderate mucosal thickening left maxillary sinus.  Cervical spondylotic changes with spinal stenosis C4-5 with mild cord flattening.  IMPRESSION: Acute non hemorrhagic small infarct posterior right temporal - parietal lobe junction.  Tiny acute non hemorrhagic infarct posterior right corona radiata.  Tiny acute infarct right parietal lobe.  Global atrophy.  C4-5 cervical spondylotic changes with spinal stenosis and mild cord flattening.  Moderate mucosal thickening left maxillary sinus.  MRA HEAD  Findings: Abrupt cut off of flow to large segment of right posterior cerebral artery branches just beyond  the right middle cerebral artery bifurcation.  No significant stenosis proximal to this level.  Right vertebral artery is dominant.  No high-grade stenosis of the vertebral arteries or basilar artery.  Mild narrowing proximal left PICA.  Poor delineation left AICA.  Slight irregularity superior cerebellar arteries bilaterally.  Mild irregularity posterior cerebral artery branch vessels with decreased visualization left posterior cerebral artery distal branches.  No aneurysm or vascular malformation noted.  IMPRESSION: Abrupt cut off of flow to large segment of right posterior cerebral artery branches just beyond the right middle cerebral artery bifurcation.  Please see above.  This has been made a PRA call report utilizing dashboard call feature.   Original Report Authenticated By: Lacy Duverney, M.D.    Mr Brain Wo Contrast  06/11/2012  *RADIOLOGY REPORT*  Clinical Data:  New onset seizures.  Hypertension and high cholesterol.  MRI BRAIN WITHOUT  CONTRAST MRA HEAD WITHOUT CONTRAST  Technique: Multiplanar, multiecho pulse sequences of the brain and surrounding structures were obtained according to standard protocol without intravenous contrast.  Angiographic images of the head were obtained using MRA technique without contrast.  Comparison: 06/11/2012 head CT.  No comparison brain MR.  MRI HEAD  Findings:  Acute non hemorrhagic small infarct posterior right temporal - parietal lobe junction.  Tiny acute non hemorrhagic infarct posterior right corona radiata.  Tiny acute infarct right parietal lobe.  Abnormal flow within the right middle cerebral artery branches. Please see below.  Global atrophy without hydrocephalus.  No intracranial hemorrhage.  No intracranial mass lesion detected on this unenhanced exam.  Moderate mucosal thickening left maxillary sinus.  Cervical spondylotic changes with spinal stenosis C4-5 with mild cord flattening.  IMPRESSION: Acute non hemorrhagic small infarct posterior right temporal - parietal lobe junction.  Tiny acute non hemorrhagic infarct posterior right corona radiata.  Tiny acute infarct right parietal lobe.  Global atrophy.  C4-5 cervical spondylotic changes with spinal stenosis and mild cord flattening.  Moderate mucosal thickening left maxillary sinus.  MRA HEAD  Findings: Abrupt cut off of flow to large segment of right posterior cerebral artery branches just beyond the right middle cerebral artery bifurcation.  No significant stenosis proximal to this level.  Right vertebral artery is dominant.  No high-grade stenosis of the vertebral arteries or basilar artery.  Mild narrowing proximal left PICA.  Poor delineation left AICA.  Slight irregularity superior cerebellar arteries bilaterally.  Mild irregularity posterior cerebral artery branch vessels with decreased visualization left posterior cerebral artery distal branches.  No aneurysm or vascular malformation noted.  IMPRESSION: Abrupt cut off of flow to large segment  of right posterior cerebral artery branches just beyond the right middle cerebral artery bifurcation.  Please see above.  This has been made a PRA call report utilizing dashboard call feature.   Original Report Authenticated By: Lacy Duverney, M.D.    Nm Pulmonary Perf And Vent  07/03/2012  *RADIOLOGY REPORT*  Clinical Data: Dyspnea and pulmonary hypertension  NM PULMONARY VENTILATION AND PERFUSION SCANS  Comparison:  Chest radiograph July 03, 2012  Views:  Anterior, posterior, left lateral, right lateral, RPO, LPO, RAO, LAO:  Ventilation perfusion  Radiopharmaceutical: Technetium 73m DTPA:  ventilation; technetium 42m macroaggregated albumin:  Perfusion  Dose: 40.0 mCi:  Ventilation; 3.0 mCi:  perfusion)  Route of administration: Inhalation:  Ventilation; intravenous: Perfusion  Findings: The ventilation study shows essentially homogeneous and symmetric uptake of radiotracer bilaterally.  There are no appreciable perfusion defects.  On the perfusion study, uptake of radiotracer is homogeneous and  symmetric bilaterally.  There is no appreciable ventilation/perfusion mismatch.  IMPRESSION:  Essentially normal ventilation and perfusion lung scans.  Very low probability of pulmonary embolus.   Original Report Authenticated By: Bretta Bang, M.D.    Dg Chest Port 1 View  06/11/2012  *RADIOLOGY REPORT*  Clinical Data: Chest pain and shortness of breath; shaking.  PORTABLE CHEST - 1 VIEW  Comparison: Chest radiograph performed 04/04/2012  Findings: The lungs are well-aerated.  Mild bilateral atelectasis is noted.  There is no evidence of pleural effusion or pneumothorax.  The cardiomediastinal silhouette is enlarged; calcification is noted within the aortic arch.  No acute osseous abnormalities are seen.  IMPRESSION: Mild bilateral atelectasis noted; stable cardiomegaly.   Original Report Authenticated By: Tonia Ghent, M.D.    Dg Abd 2 Views  06/11/2012  *RADIOLOGY REPORT*  Clinical Data: Nausea,  vomiting, abdominal pain and distension.  ABDOMEN - 2 VIEW  Comparison: None.  Findings: The visualized bowel gas pattern is unremarkable. Scattered air and stool filled loops of colon are seen; no abnormal dilatation of small bowel loops is seen to suggest small bowel obstruction.  No free intra-abdominal air is identified, though evaluation for free air is limited on a single supine view.  The visualized osseous structures are within normal limits; the sacroiliac joints are unremarkable in appearance.  Mild left basilar opacity likely reflects atelectasis.  IMPRESSION:  1.  Unremarkable bowel gas pattern; no free intra-abdominal air seen. 2.  Mild left basilar airspace opacity likely reflects atelectasis.   Original Report Authenticated By: Tonia Ghent, M.D.    Dg Abd Portable 1v  06/22/2012  *RADIOLOGY REPORT*  Clinical Data: Constipation.  PORTABLE ABDOMEN - 1 VIEW  Comparison: 06/11/2012.  Findings: Minimally dilated small bowel loops in the mid abdomen. Normal caliber colon.  Moderate amount of stool in the rectum with smaller amounts of stool elsewhere in the colon.  Lumbar spine degenerative changes.  IMPRESSION:  1.  Minimal small bowel ileus or partial obstruction. 2.  Moderate amount of stool in the rectum with a lesser amount of stool elsewhere in the colon.   Original Report Authenticated By: Beckie Salts, M.D.     ASSESSMENT AND PLAN  Active Problems:  Acute On Chronic systolic congestive heart failure, NYHA class 2 EF 20 % 12.23.2013  HTN (hypertension)  Seizure  multiple right brain embolic strokes  Atrial fibrillation  SOB (shortness of breath)  Left bundle branch block  NICM (nonischemic cardiomyopathy)  HLD (hyperlipidemia)  Acute on chronic systolic heart failure  ARF (acute renal failure)  PAH (pulmonary artery hypertension)  Acute cor pulmonale   He is doing well. He should be ready for discharge tomorrow if INR therapeutic. With increased BUN and creatinine from  yesterday and being close to euvolemic, I will decrease his diuretic today. Change Lasix to 40 mg daily. Reassess in a.m.   Signed, Valera Castle MD

## 2012-07-08 NOTE — Progress Notes (Signed)
NCM spoke to [facility] pharmacy about IV Milrinone HH RN availability for d/c today. Will need orders and instructions and can arrange [facility] RN to start medication on 1/20. Requested dc Rx, orders and instructions from MD. Isidoro Donning RN CCM Case Mgmt phone [phone]

## 2012-07-08 NOTE — Progress Notes (Addendum)
Addendum: heparin gtt decreased to 1150units/hr due to supratherapeutic HL. Will order 8 hour heparin level to f/u.   Franchot Erichsen, Pharm.D. Clinical Pharmacist   Pager: [phone] 07/08/2012 3:42 PM   ANTICOAGULATION CONSULT NOTE - Follow Up Consult  Pharmacy Consult for heparin and warfarin Indication: atrial fibrillation and LV thrombus  No Known Allergies  Patient Measurements: Height: 5\' 8"  (172.7 cm) Weight: 145 lb 15.1 oz (66.2 kg) IBW/kg (Calculated) : 68.4  Heparin Dosing Weight: 66.4kg  Vital Signs: Temp: 97.5 F (36.4 C) (01/19 0340) Temp src: Oral (01/19 0340) BP: 109/48 mmHg (01/19 0340)  Labs:  Basename 07/08/12 0518 07/07/12 1954 07/07/12 0850 07/07/12 0500 07/07/12 0139 07/06/12 0610  HGB 15.1 -- -- 13.8 -- --  HCT 43.6 -- -- 39.8 -- 40.2  PLT 221 -- -- 201 -- 188  APTT -- -- -- -- -- --  LABPROT 18.3* -- -- 18.1* 16.4* --  INR 1.57* -- -- 1.55* 1.35 --  HEPARINUNFRC 0.73* 0.57 0.24* -- -- --  CREATININE 1.48* -- -- 1.31 -- 1.31  CKTOTAL -- -- -- -- -- --  CKMB -- -- -- -- -- --  TROPONINI -- -- -- -- -- --    Estimated Creatinine Clearance: 39.1 ml/min (by C-G formula based on Cr of 1.48).   Assessment: 58 YOM admitted with CP and SOB on 1/3. History of Afib on warfarin PTA, also has LV thrombus and was started on heparin. Heparin level slightly > goal this am 0.73 units/ml  INR this morning is 1.57  Goal of Therapy:  INR 2-3 Heparin level 0.3-0.7 units/ml Monitor platelets by anticoagulation protocol: Yes   Plan:  1. Decrease heparin slightly to 1300units/hr 2. Warfarin 6mg  x1 tonight 3.  Cont daily HL, CBC and PT/INR.  Talbert Cage, PharmD Clinical Pharmacist Pager: [phone] 07/08/2012 6:23 AM

## 2012-07-09 LAB — CBC
HCT: 45.2 % (ref 39.0–52.0)
Hemoglobin: 15.8 g/dL (ref 13.0–17.0)
MCHC: 35 g/dL (ref 30.0–36.0)

## 2012-07-09 LAB — BASIC METABOLIC PANEL
BUN: 29 mg/dL — ABNORMAL HIGH (ref 6–23)
Chloride: 97 mEq/L (ref 96–112)
GFR calc Af Amer: 48 mL/min — ABNORMAL LOW (ref >90)
Potassium: 4.5 mEq/L (ref 3.5–5.1)
Sodium: 133 mEq/L — ABNORMAL LOW (ref 135–145)

## 2012-07-09 LAB — HEPARIN LEVEL (UNFRACTIONATED): Heparin Unfractionated: 0.51 IU/mL (ref 0.30–0.70)

## 2012-07-09 LAB — CARBOXYHEMOGLOBIN: Methemoglobin: 0.8 % (ref 0.0–1.5)

## 2012-07-09 LAB — PROTIME-INR: INR: 1.73 — ABNORMAL HIGH (ref 0.00–1.49)

## 2012-07-09 MED ORDER — WARFARIN SODIUM 4 MG PO TABS
8.0000 mg | ORAL_TABLET | Freq: Once | ORAL | Status: AC
  Administered 2012-07-09: 8 mg via ORAL
  Filled 2012-07-09 (×3): qty 2

## 2012-07-09 NOTE — Progress Notes (Signed)
CARDIAC REHAB PHASE I   PRE:  Rate/Rhythm: 81 afib    BP: sitting 111/65    SaO2:   MODE:  Ambulation: 900 ft   POST:  Rate/Rhythm: 95 afib with PVCs    BP: sitting 114/75     SaO2: 98-100 RA  Tolerated very well. Feels good. No c/o. SaO2 much improved. [id number]  Harriet Masson CES, ACSM

## 2012-07-09 NOTE — Progress Notes (Signed)
Rechecked BP= 95/51. Pt asymptomatic. Says bp has been on low side . Observed pt closely.

## 2012-07-09 NOTE — Progress Notes (Signed)
Advanced Heart Failure Rounding Note  Primary Cardiologist: Brackbill  Subjective:    79 y.o. [name] w/ PMHx significant for Chronic Systolic CHF (EF 16%), NICM (normal coronary anatomy 2005), LBBB, PAF (on coumadin), HTN, HLD, CVA, and Seizure (2/2 CVA 05/2012).  Spanish speaking with some understanding of English.  Admitted with ADHF. TEE Friday with LAA clot so no DC-CV. Amio stopped. Milrinone stopped  07/01/12. 1/13//14 Milrinone and Revatio started post cath due to pulmonary hypertension. 07/03/12 VQ scan low probability for pulmonary embolus. Changed sildenafil to adcirca.      07/02/12 RHC/LHC RA = 15  RV = 94/10/13  PA = 93/42 (59)  PCW = 15  Fick cardiac output/index = 1.9/1.1  PVR = 23.0 Woods  SVR = 3140  FA sat = 95%  PA sat = 31%, 32%  Ao Pressure: 121/60 (84)  LV Pressure: 115/12/14  1.17-> 1.52->1.59 -> 1.8 -> 1.5> 1.69>1.23>1.35>1.48>1.55 Co-ox 65  Feels better on milrinone. No orthopnea/PND.  Walked on Friday without issue.     Objective:   Vital Signs:   Temp:  [97.4 F (36.3 C)-98.7 F (37.1 C)] 97.5 F (36.4 C) (01/20 0730) Pulse Rate:  [88] 88  (01/20 0730) Resp:  [15-18] 16  (01/20 0730) BP: (94-116)/(52-65) 116/65 mmHg (01/20 0730) SpO2:  [94 %-97 %] 97 % (01/20 0730) Weight:  [65.5 kg (144 lb 6.4 oz)] 65.5 kg (144 lb 6.4 oz) (01/20 0440) Last BM Date: 07/08/12  Weight change: Filed Weights   07/07/12 0440 07/08/12 0457 07/09/12 0440  Weight: 66.4 kg (146 lb 6.2 oz) 66.2 kg (145 lb 15.1 oz) 65.5 kg (144 lb 6.4 oz)    Intake/Output:   Intake/Output Summary (Last 24 hours) at 07/09/12 0807 Last data filed at 07/09/12 0600  Gross per 24 hour  Intake  449.2 ml  Output   2025 ml  Net -1575.8 ml     Physical Exam:  General:  Well appearing. No resp difficulty.  Spanish speaking  HEENT: normal Neck: supple. JVP 5-6 Carotids 2+ bilat; no bruits. No lymphadenopathy or thryomegaly appreciated. Cor: PMI nondisplaced. Irregular irregular.  No  rubs, gallops or murmurs. Lungs: clear Abdomen: soft, nontender, + mildly distended. No hepatosplenomegaly. No bruits or masses. Good bowel sounds. Extremities: no cyanosis, clubbing, rash, edema.  Neuro: alert & orientedx3, cranial nerves grossly intact. moves all 4 extremities w/o difficulty. Affect pleasant  Telemetry: afib/flutter 50- 60s  Labs: Basic Metabolic Panel:  Lab 07/09/12 1096 07/08/12 0518 07/07/12 0500 07/06/12 0610 07/05/12 0445  NA 133* 135 134* 137 138  K 4.5 4.3 3.6 3.8 4.0  CL 97 97 97 101 102  CO2 26 26 27 23 26   GLUCOSE 107* 109* 141* 104* 100*  BUN 29* 29* 26* 23 23  CREATININE 1.55* 1.48* 1.31 1.31 1.35  CALCIUM 9.8 9.5 8.8 -- --  MG -- -- -- -- --  PHOS -- -- -- -- --    Liver Function Tests: No results found for this basename: AST:5,ALT:5,ALKPHOS:5,BILITOT:5,PROT:5,ALBUMIN:5 in the last 168 hours No results found for this basename: LIPASE:5,AMYLASE:5 in the last 168 hours No results found for this basename: AMMONIA:3 in the last 168 hours  CBC:  Lab 07/09/12 0504 07/08/12 0518 07/07/12 0500 07/06/12 0610 07/06/12 0411  WBC 9.4 9.0 9.4 8.4 6.3  NEUTROABS -- -- -- -- --  HGB 15.8 15.1 13.8 13.4 9.9*  HCT 45.2 43.6 39.8 40.2 29.7*  MCV 91.5 91.6 91.9 92.8 93.4  PLT 237 221 201 188 147*  Cardiac Enzymes: No results found for this basename: CKTOTAL:5,CKMB:5,CKMBINDEX:5,TROPONINI:5 in the last 168 hours  BNP: BNP (last 3 results)  Basename 06/22/12 1434 06/11/12 0401 04/04/12 1245  PROBNP 32268.0* 15163.0* 9002.0*        Imaging: No results found.   Medications:     Scheduled Medications:    . bisacodyl  10 mg Rectal Once  . carvedilol  3.125 mg Oral BID WC  . digoxin  0.0625 mg Oral Daily  . docusate sodium  200 mg Oral BID  . famotidine  40 mg Oral Daily  . fenofibrate  54 mg Oral Daily  . furosemide  40 mg Oral Daily  . loratadine  10 mg Oral Daily  . polyethylene glycol  17 g Oral Daily  . potassium chloride  20 mEq  Oral Daily  . simvastatin  20 mg Oral QPM  . sodium chloride  10-40 mL Intracatheter Q12H  . sodium chloride  3 mL Intravenous Q12H  . spironolactone  25 mg Oral Daily  . Tadalafil (PAH)  40 mg Oral Daily  . Warfarin - Pharmacist Dosing Inpatient   Does not apply q1800    Infusions:    . heparin 1,150 Units/hr (07/09/12 0350)  . milrinone 0.375 mcg/kg/min (07/09/12 0036)    PRN Medications: sodium chloride, acetaminophen, bisacodyl, ondansetron (ZOFRAN) IV, sodium chloride, sodium chloride   Assessment:   1. Acute on chronic systolic CHF  2. Atrial fibrillation, now with a controlled VR  3. H/o stroke  4. HTN  5. LBBB 6. Acute renal failure (baseline Cr 1.1-1.3) 7. Severe PAH with cor pulmonale and cardiogenic shock  Plan/Discussion:    Much improved on milrinone and PDE-5 inhibitor.  Will continue with lasix 40 mg daily.   Continue coumadin for AF and LAA clot.  Consider outpatient DC-CV.  Continue to ambulate with cardiac rehab  Will need repeat RHC in 1 month or so. Also will need to decide if next step will be DC-CV and attempted BiV or VAD evaluation. Given language barrier the first option may be best path.  Length of Stay: 17  Daniel Bensimhon,MD 8:08 AM

## 2012-07-09 NOTE — Progress Notes (Signed)
ANTICOAGULATION CONSULT NOTE - Follow Up Consult  Pharmacy Consult for heparin Indication: atrial fibrillation and LV thrombus  Labs:  Basename 07/08/12 2243 07/08/12 1420 07/08/12 0518 07/07/12 0500 07/07/12 0139 07/06/12 0610  HGB -- -- 15.1 13.8 -- --  HCT -- -- 43.6 39.8 -- 40.2  PLT -- -- 221 201 -- 188  APTT -- -- -- -- -- --  LABPROT -- -- 18.3* 18.1* 16.4* --  INR -- -- 1.57* 1.55* 1.35 --  HEPARINUNFRC 0.51 0.72* 0.73* -- -- --  CREATININE -- -- 1.48* 1.31 -- 1.31  CKTOTAL -- -- -- -- -- --  CKMB -- -- -- -- -- --  TROPONINI -- -- -- -- -- --    Assessment/Plan:  79yo male now therapeutic on heparin after rate decreases.  Will continue gtt at current rate and confirm stable with am labs.  [name] PharmD BCPS 07/09/2012,12:21 AM

## 2012-07-09 NOTE — Progress Notes (Signed)
ANTICOAGULATION CONSULT NOTE - Follow Up Consult  Pharmacy Consult for heparin Indication: atrial fibrillation and LV thrombus  Labs:  Basename 07/09/12 0504 07/08/12 2243 07/08/12 1420 07/08/12 0518 07/07/12 0500  HGB 15.8 -- -- 15.1 --  HCT 45.2 -- -- 43.6 39.8  PLT 237 -- -- 221 201  APTT -- -- -- -- --  LABPROT 19.7* -- -- 18.3* 18.1*  INR 1.73* -- -- 1.57* 1.55*  HEPARINUNFRC 0.46 0.51 0.72* -- --  CREATININE 1.55* -- -- 1.48* 1.31  CKTOTAL -- -- -- -- --  CKMB -- -- -- -- --  TROPONINI -- -- -- -- --    Admit Complaint: 79 y.o. male admitted 06/22/2012 with CP, SOB, after recent hospitalization.  On warfarin PTA for afib, 4 mg daily, CVA, LV thrombus, pharmacy consulted to dose heparin and warfarin  Assessment Events: Wants to go home, waiting tx INR,   Anticoag: afib/CVA in December 2013 (4mg  daily). LV thrombus/afib/rule out PE - now with severe pulm htn. HL at goal, INR still < goal.  ID: No ABX, afeb, WBC wnl  CV: HTN, CHF (EF 20-25%), hyperlipidemia, BBB, CVA: Cleda Daub, simva, dig, coreg, tadalafil (start 1/13) .dig1.2 (1/3) > dose dec. BP low 100-90s/50s, HR 80s, UOP 1.47mL/kg/hr, Admit weight 160, 1/20 weight 144lb, Discharge weight 12/24 157lb. RHC Fri with PCWP of 34, TEE showed LA clot on 1/10 - dccv postponed until 12month of anticoag   Endocrine: glucose ok  Neuro/MSK: Siezures, no therapy PTA  Nephrology/Urology/Electrolytes: SCr 1.55, crcl 37; Lytes wnl.  Pulmonary: 95% RA, pulm htn Heme/Onc- CBC stable  PTA Medication Issues: All Home Meds Ordered  Best Practices: DVT Prophylaxis: heparin & warfarin  Goal of Therapy:  Heparin level 0.3-0.7 units/ml INR 2-3   Plan:  Continue heparin at 1150 units/hr Warfarin 8 mg today  Thank you for allowing pharmacy to be a part of this patients care team.  Lovenia Kim Pharm.D., BCPS Clinical Pharmacist 07/09/2012 8:03 AM Pager: [phone] Phone: [phone]

## 2012-07-10 DIAGNOSIS — I5023 Acute on chronic systolic (congestive) heart failure: Secondary | ICD-10-CM

## 2012-07-10 DIAGNOSIS — I4891 Unspecified atrial fibrillation: Secondary | ICD-10-CM

## 2012-07-10 LAB — PROTIME-INR
INR: 2.13 — ABNORMAL HIGH (ref 0.00–1.49)
Prothrombin Time: 22.9 seconds — ABNORMAL HIGH (ref 11.6–15.2)

## 2012-07-10 LAB — BASIC METABOLIC PANEL
BUN: 27 mg/dL — ABNORMAL HIGH (ref 6–23)
Calcium: 9.6 mg/dL (ref 8.4–10.5)
Creatinine, Ser: 1.58 mg/dL — ABNORMAL HIGH (ref 0.50–1.35)
GFR calc Af Amer: 47 mL/min — ABNORMAL LOW (ref >90)
GFR calc non Af Amer: 41 mL/min — ABNORMAL LOW (ref >90)
Potassium: 4.7 mEq/L (ref 3.5–5.1)

## 2012-07-10 LAB — CARBOXYHEMOGLOBIN
Carboxyhemoglobin: 1.1 % (ref 0.5–1.5)
Methemoglobin: 1 % (ref 0.0–1.5)
Total hemoglobin: 15 g/dL (ref 13.5–18.0)

## 2012-07-10 LAB — CBC
MCHC: 34.2 g/dL (ref 30.0–36.0)
RDW: 14.6 % (ref 11.5–15.5)

## 2012-07-10 MED ORDER — MILRINONE IN DEXTROSE 20 MG/100ML IV SOLN: 0.3750 ug/kg/min | INTRAVENOUS | Status: DC

## 2012-07-10 MED ORDER — WARFARIN SODIUM 4 MG PO TABS
4.0000 mg | ORAL_TABLET | Freq: Every day | ORAL | Status: AC
  Administered 2012-07-10: 4 mg via ORAL
  Filled 2012-07-10: qty 1

## 2012-07-10 MED ORDER — TADALAFIL (PAH) 20 MG PO TABS: 40.0000 mg | ORAL_TABLET | Freq: Every day | ORAL | Status: DC

## 2012-07-10 MED ORDER — DIGOXIN 125 MCG PO TABS: 0.0625 mg | ORAL_TABLET | Freq: Every day | ORAL | Status: DC

## 2012-07-10 MED ORDER — CARVEDILOL 6.25 MG PO TABS: 3.1250 mg | ORAL_TABLET | Freq: Two times a day (BID) | ORAL | Status: DC

## 2012-07-10 MED ORDER — POTASSIUM CHLORIDE CRYS ER 20 MEQ PO TBCR: 20.0000 meq | EXTENDED_RELEASE_TABLET | Freq: Every day | ORAL | Status: DC

## 2012-07-10 MED ORDER — FUROSEMIDE 40 MG PO TABS: 40.0000 mg | ORAL_TABLET | Freq: Every day | ORAL | Status: DC

## 2012-07-10 NOTE — Discharge Summary (Signed)
[facility] Team  Discharge Summary   Patient ID: Tyler Christensen MRN: [id number], DOB/AGE: [id number] y.o. Admit date: 06/22/2012 D/C date:     07/10/2012   Primary Discharge Diagnoses:  1. Acute on chronic systolic CHF  2. Atrial fibrillation, now with a controlled VR  3. H/o stroke  4. HTN  5. LBBB  6. Acute renal failure (baseline Cr 1.1-1.3)  7. Severe PAH with cor pulmonale and cardiogenic shock    Hospital Course:  Mr. Speckman is a 79 y.o. [name] (non-english speaking) w/ PMHx significant for Chronic Systolic CHF (EF 52%), NICM, LBBB, PAF (on coumadin), HTN, HLD, CVA, and Seizure (2/2 CVA 05/2012).   He was admitted with acute decompensated heart failure. Poor response to IV lasix. Started on milrinone with improvement in diuresis and symptoms. It was felt that AF was worsening his HF. Underwent TEE which showed EF 20% + LAA clot so DC-CV deferred. Seen by Dr. Ladona Ridgel in EP who felt patient not ready for BiVICD.  Milrinone stopped 07/01/12 in anticipation of RHC. 07/02/12 RHC performed and revealed severe pulmonary hypertension with normal PCWP (after 12-pound diuresis)  07/02/12 RHC/LHC  RA = 15  RV = 94/10/13  PA = 93/42 (59)  PCW = 15  Fick cardiac output/index = 1.9/1.1  PVR = 23.0 Woods  SVR = 3140  FA sat = 95%  PA sat = 31%, 32%  Ao Pressure: 121/60 (84)  LV Pressure: 115/12/14   Post cath he was placed on Milrinone and Revatio (eventually transitioned to tadalafil 40). VQ low probability for pulmonary embolus.   Repeat RHC on 07/06/12 with markedly improved PVR and Cardiac Index noted. PCWP up. Lasix restarted.  07/06/12 RHC RA = 9  RV = 74/1/7  PA = 75/32 (47)  PCW = 34  Fick cardiac output/index = 3.9/2.1  PVR = 3.3 Woods  FA sat = 92%  PA sat = 56%, 56%   Marked improvement in symptoms. Able to ambulate halls independently.   He will remain on Milrinone at 0.375 mcg via PICC. Continue Adcirca 40 mg daily. LifeVest placed. Discharge weight 146  pounds. (admit 160)  He will continue to be followed closely in the HF clinic with follow up July 13, 2012 at 9:00. [facility] to follow for home Milrinone management. Anticipate possible DC-CV after 4 weeks of therapeutic anti-coagulation. May also consider BiVICD in near future. Will need repeat RHC at some point. No ACE-I at this point due to soft BP.  On day of discharge patient received extensive education on his medications from the HF team through an interpreter.   Discharge Weight Range:  Discharge Vitals: Blood pressure 95/49, pulse 116, temperature 97.5 F (36.4 C), temperature source Oral, resp. rate 20, height 5\' 8"  (1.727 m), weight 66.316 kg (146 lb 3.2 oz), SpO2 97.00%.  Labs: Lab Results  Component Value Date   WBC 9.5 07/10/2012   HGB 15.2 07/10/2012   HCT 44.4 07/10/2012   MCV 92.3 07/10/2012   PLT 224 07/10/2012     Lab 07/10/12 0530  NA 135  K 4.7  CL 98  CO2 26  BUN 27*  CREATININE 1.58*  CALCIUM 9.6  PROT --  BILITOT --  ALKPHOS --  ALT --  AST --  GLUCOSE 103*   Lab Results  Component Value Date   CHOL 128 06/12/2012   HDL 27* 06/12/2012   LDLCALC 78 06/12/2012   TRIG 113 06/12/2012   BNP (last 3 results)  Basename 06/22/12 1434 06/11/12 0401 04/04/12 1245  PROBNP 32268.0* 15163.0* 9002.0*    Diagnostic Studies/Procedures   No results found.  Discharge Medications     Medication List     As of 07/10/2012  3:48 PM    TAKE these medications         carvedilol 6.25 MG tablet   Commonly known as: COREG   Take 0.5 tablets (3.125 mg total) by mouth 2 (two) times daily with a meal.      digoxin 0.125 MG tablet   Commonly known as: LANOXIN   Take 0.5 tablets (0.0625 mg total) by mouth daily.      furosemide 40 MG tablet   Commonly known as: LASIX   Take 1 tablet (40 mg total) by mouth daily.      HYDROcodone-acetaminophen 5-325 MG per tablet   Commonly known as: NORCO/VICODIN   Take 1 tablet by mouth every 6 (six) hours  as needed for pain.      loratadine 10 MG tablet   Commonly known as: CLARITIN   Take 10 mg by mouth daily.      milrinone 20 MG/100ML Soln infusion   Commonly known as: PRIMACOR   Inject 25.3125 mcg/min into the vein continuous.      potassium chloride SA 20 MEQ tablet   Commonly known as: K-DUR,KLOR-CON   Take 1 tablet (20 mEq total) by mouth daily.      ranitidine 300 MG tablet   Commonly known as: ZANTAC   Take 300 mg by mouth daily.      simvastatin 20 MG tablet   Commonly known as: ZOCOR   Take 20 mg by mouth every evening.      spironolactone 25 MG tablet   Commonly known as: ALDACTONE   Take 25 mg by mouth daily.      Tadalafil (PAH) 20 MG Tabs   Take 2 tablets (40 mg total) by mouth daily.      TRILIPIX 45 MG capsule   Generic drug: Choline Fenofibrate   Take 45 mg by mouth daily.      warfarin 4 MG tablet   Commonly known as: COUMADIN   Take 1 tablet (4 mg total) by mouth daily.          Disposition   The patient will be discharged in stable condition to home. Discharge Orders    Future Appointments: Provider: Department: Dept Phone: Center:   07/13/2012 9:00 AM [facility] [phone] None   07/13/2012 10:30 AM [facility] [phone] None     Future Orders Please Complete By Expires   Diet - low sodium heart healthy      Increase activity slowly      STOP any activity that causes chest pain, shortness of breath, dizziness, sweating, or exessive weakness      Heart Failure patients record your daily weight using the same scale at the same time of day      ACE Inhibitor / ARB already ordered        Follow-up Information    Follow up with Arvilla Meres, MD. On 07/13/2012. (at 9:00 Gar[facility] 0090)    Contact information:   [address] [place] [address] [phone]       Follow up with [facility]. On  07/13/2012. (10:30a)    Contact information:   [address] [place] [address]  Duration of Discharge Encounter: Greater than 35 minutes  Signed, Nicholes Mango, MD

## 2012-07-10 NOTE — Progress Notes (Signed)
ANTICOAGULATION CONSULT NOTE - Follow Up Consult  Pharmacy Consult for heparin Indication: atrial fibrillation and LV thrombus  Labs:  Basename 07/10/12 0530 07/09/12 0504 07/08/12 2243 07/08/12 0518  HGB 15.2 15.8 -- --  HCT 44.4 45.2 -- 43.6  PLT 224 237 -- 221  APTT -- -- -- --  LABPROT 22.9* 19.7* -- 18.3*  INR 2.13* 1.73* -- 1.57*  HEPARINUNFRC 0.32 0.46 0.51 --  CREATININE 1.58* 1.55* -- 1.48*  CKTOTAL -- -- -- --  CKMB -- -- -- --  TROPONINI -- -- -- --    Admit Complaint: 79 y.o. male admitted 06/22/2012 with CP, SOB, after recent hospitalization.  On warfarin PTA for afib, 4 mg daily, CVA, LV thrombus, pharmacy consulted to dose heparin and warfarin  Assessment Events: Wants to go home, INR now therapeutic to discharge home today.  Anticoag: afib/CVA in December 2013 (4mg  daily, PTA). LV thrombus/afib/rule out PE - now with severe pulm htn.  PTA Medication Issues: All Home Meds Ordered  Best Practices: DVT Prophylaxis: warfarin  Goal of Therapy:  INR 2-3   Plan:  Warfarin 4 mg daily Plan INR follow up as outpatient within next 7d  Thank you for allowing pharmacy to be a part of this patients care team.  Lovenia Kim Pharm.D., BCPS Clinical Pharmacist 07/10/2012 10:06 AM Pager: [phone] Phone: [phone]

## 2012-07-10 NOTE — Progress Notes (Signed)
CARDIAC REHAB PHASE I   PRE:  Rate/Rhythm: 84 afib with PVCs    BP: sitting 90/50    SaO2: 97 RA  MODE:  Ambulation: 760 ft   POST:  Rate/Rhythm: 95    BP: sitting 120/60     SaO2: 99 RA  Tolerated very well. sts he had episode of acute SOB in bathroom earlier today while having BM. Resolved with rest. O/w feels really well with no SOB. Able to st importance of wts and low sodium and walking daily.  [id number]  Harriet Masson CES, ACSM

## 2012-07-10 NOTE — Progress Notes (Signed)
Interpreter Wyvonnia Dusky for Dr Gala Romney an RN discharge

## 2012-07-13 ENCOUNTER — Ambulatory Visit (INDEPENDENT_AMBULATORY_CARE_PROVIDER_SITE_OTHER): Payer: Medicare Other | Admitting: *Deleted

## 2012-07-13 ENCOUNTER — Ambulatory Visit (HOSPITAL_COMMUNITY)
Admit: 2012-07-13 | Discharge: 2012-07-13 | Disposition: A | Discharge status: Home / Self Care | Payer: Medicare Other | Attending: Internal Medicine | Admitting: Internal Medicine

## 2012-07-13 ENCOUNTER — Encounter (HOSPITAL_COMMUNITY): Payer: Self-pay

## 2012-07-13 VITALS — BP 134/90 | HR 105 | Wt 149.8 lb

## 2012-07-13 DIAGNOSIS — I4891 Unspecified atrial fibrillation: Secondary | ICD-10-CM

## 2012-07-13 DIAGNOSIS — I2789 Other specified pulmonary heart diseases: Secondary | ICD-10-CM

## 2012-07-13 DIAGNOSIS — I5022 Chronic systolic (congestive) heart failure: Secondary | ICD-10-CM | POA: Insufficient documentation

## 2012-07-13 DIAGNOSIS — I279 Pulmonary heart disease, unspecified: Secondary | ICD-10-CM | POA: Insufficient documentation

## 2012-07-13 NOTE — Progress Notes (Signed)
Interpreter Wyvonnia Dusky for Dr  Gala Romney Team at [facility]

## 2012-07-13 NOTE — Progress Notes (Signed)
Patient ID: Tyler Christensen, male   DOB: [birth year], 79 y.o.   MRN: [id number]  Weight Range   Baseline proBNP     HPI: Tyler Christensen is a 79 y.o. Tyler Christensen male (non-english speaking) w/ PMHx significant for Chronic Systolic CHF (EF 86%), NICM, LBBB, PAF (on coumadin), HTN, HLD, CVA, Pulmonary Hypertension, LAA clot, and Seizure (2/2 CVA 05/2012).    Admitted to [facility] 06/2012 with acute decompensated heart failure. As noted below diagnosed with severe pulmonary hypertension from initial RHC and placed on Milrinone and Revatio (eventually transitioned to tadalafil 40). VQ low probability for pulmonary embolus. Repeat RHC on 07/06/12 with markedly improved PVR and Cardiac Index noted. Discharged on Milrinone at 0.375 mcg via PICC. Anticipate possible DC-CV after 4 weeks of therapeutic anti-coagulation (07/08/12).  May also consider BiVICD in near future. He was not placed on an ACE-I at this point due to soft BP. Discharge Weight 146 pounds   07/02/12 RHC  RA = 15  RV = 94/10/13  PA = 93/42 (59)  PCW = 15  Fick cardiac output/index = 1.9/1.1  PVR = 23.0 Woods  SVR = 3140  FA sat = 95%  PA sat = 31%, 32%  Ao Pressure: 121/60 (84)  LV Pressure: 115/12/14   07/06/12 RHC  RA = 9  RV = 74/1/7  PA = 75/32 (47)  PCW = 34  Fick cardiac output/index = 3.9/2.1  PVR = 3.3 Woods  FA sat = 92%  PA sat = 56%, 56%   He returns for post hospital follow up. Denies SOB/PND/Orthopnea. He has not been weighing at home. Appetite poor. Compliant with medications. Followed by [facility] for home Milrinone @ 0.375 mcg and PICC maintenance. Lab work to be drawn every Tuesday. Lives with his wife and and 3 step sons. Limited family support during the day.He has not food during the day. He has a friend that provides his breakfast and dinner.  No bleeding problems. PT/INR managed at [facility].   ROS: All systems negative except as listed in HPI, PMH and Problem List.  Past Medical History  Diagnosis Date  . HTN  (hypertension)   . High cholesterol   . CHF (congestive heart failure)   . BBBB (bilateral bundle branch block)   . LBBB (left bundle branch block)   . Weakness   . Tired   . Seizures 06/11/2012    new onset/notes (06/11/2012)  . SOB (shortness of breath)     "sometimes when I lay down;; related to not taking my RX" (06/11/2012)    Current Outpatient Prescriptions  Medication Sig Dispense Refill  . carvedilol (COREG) 6.25 MG tablet Take 0.5 tablets (3.125 mg total) by mouth 2 (two) times daily with a meal.      . Choline Fenofibrate (TRILIPIX) 45 MG capsule Take 45 mg by mouth daily.      . digoxin (LANOXIN) 0.125 MG tablet Take 0.5 tablets (0.0625 mg total) by mouth daily.      . furosemide (LASIX) 40 MG tablet Take 1 tablet (40 mg total) by mouth daily.  30 tablet    . HYDROcodone-acetaminophen (NORCO) 5-325 MG per tablet Take 1 tablet by mouth every 6 (six) hours as needed for pain.  30 tablet  0  . loratadine (CLARITIN) 10 MG tablet Take 10 mg by mouth daily.      . milrinone (PRIMACOR) 20 MG/100ML SOLN infusion Inject 25.3125 mcg/min into the vein continuous.  100 mL    . potassium chloride  SA (K-DUR,KLOR-CON) 20 MEQ tablet Take 1 tablet (20 mEq total) by mouth daily.  30 tablet  6  . ranitidine (ZANTAC) 300 MG tablet Take 300 mg by mouth daily.      . simvastatin (ZOCOR) 20 MG tablet Take 20 mg by mouth every evening.      Marland Kitchen spironolactone (ALDACTONE) 25 MG tablet Take 25 mg by mouth daily.      . Tadalafil, PAH, 20 MG TABS Take 2 tablets (40 mg total) by mouth daily.  60 tablet  6  . warfarin (COUMADIN) 4 MG tablet Take 1 tablet (4 mg total) by mouth daily.  30 tablet  0     PHYSICAL EXAM: Filed Vitals:   07/13/12 1101  BP: 134/90  Pulse: 105  Weight: 149 lb 12.8 oz (67.949 kg)  SpO2: 100%   Spanish Interpreter  present General:  Well appearing. No resp difficulty  HEENT: normal Neck: supple. JVP 5-6. Carotids 2+ bilaterally; no bruits. No lymphadenopathy or  thryomegaly appreciated. Cor: PMI normal. Irregular rate & rhythm. No rubs, gallops or murmurs. Life Vest on Lungs: clear Abdomen: soft, nontender, nondistended. No hepatosplenomegaly. No bruits or masses. Good bowel sounds. Extremities: no cyanosis, clubbing, rash, RUE PICC  Neuro: alert & orientedx3, cranial nerves grossly intact. Moves all 4 extremities w/o difficulty. Affect pleasant.     ASSESSMENT & PLAN:

## 2012-07-13 NOTE — Assessment & Plan Note (Addendum)
Spanish interpreter present. NYHA II. Volume status stable. Continue current regimen. Will not titrate medications but re-evaluate at next visit. Reinforced medication compliance, daily weights, and low salt food choices. Provided Spanish Heart Failure booklet and Spanish weight guide. Greater than 30 minutes spent with [facility] Milrinone pump and PICC maintenance. [facility] representative evaluated PICC and Milrinone pump during visit. HH to provide SW consult to assist with food at home.  Follow up in 2 weeks.

## 2012-07-13 NOTE — Patient Instructions (Addendum)
Follow up in 2 weeks  Do the following things EVERYDAY: 1) Weigh yourself in the morning before breakfast. Write it down and keep it in a log. 2) Take your medicines as prescribed 3) Eat low salt foods--Limit salt (sodium) to 2000 mg per day.  4) Stay as active as you can everyday 5) Limit all fluids for the day to less than 2 liters 

## 2012-07-13 NOTE — Assessment & Plan Note (Addendum)
Funcitonally doing well on Tadalafil and  Milrinone. Will check 6 MW at next visit. Continue current regimen.

## 2012-07-13 NOTE — Assessment & Plan Note (Signed)
Stable on Coumadin. Coumadin per [facility]. Will need DC-CV once on Coumadin for 4 weeks. Re-evaluate at next visit.

## 2012-07-17 ENCOUNTER — Ambulatory Visit: Payer: Self-pay | Admitting: Cardiology

## 2012-07-17 DIAGNOSIS — I4891 Unspecified atrial fibrillation: Secondary | ICD-10-CM

## 2012-07-17 LAB — POCT INR: INR: 2.5

## 2012-07-23 DIAGNOSIS — I5023 Acute on chronic systolic (congestive) heart failure: Secondary | ICD-10-CM

## 2012-07-24 ENCOUNTER — Ambulatory Visit: Payer: Self-pay | Admitting: Cardiology

## 2012-07-24 ENCOUNTER — Other Ambulatory Visit: Payer: Self-pay | Admitting: *Deleted

## 2012-07-24 DIAGNOSIS — I4891 Unspecified atrial fibrillation: Secondary | ICD-10-CM

## 2012-07-24 LAB — POCT INR: INR: 4.1

## 2012-07-24 MED ORDER — WARFARIN SODIUM 4 MG PO TABS: 4.0000 mg | ORAL_TABLET | Freq: Every day | ORAL | Status: DC

## 2012-07-26 ENCOUNTER — Ambulatory Visit (HOSPITAL_COMMUNITY)
Admission: RE | Admit: 2012-07-26 | Discharge: 2012-07-26 | Disposition: A | Discharge status: Home / Self Care | Payer: Medicare Other | Source: Ambulatory Visit | Attending: Internal Medicine | Admitting: Internal Medicine

## 2012-07-26 VITALS — BP 138/70 | HR 104 | Wt 151.8 lb

## 2012-07-26 DIAGNOSIS — I5022 Chronic systolic (congestive) heart failure: Secondary | ICD-10-CM | POA: Insufficient documentation

## 2012-07-26 DIAGNOSIS — I4891 Unspecified atrial fibrillation: Secondary | ICD-10-CM

## 2012-07-26 MED ORDER — CARVEDILOL 6.25 MG PO TABS: ORAL_TABLET | ORAL | Status: DC

## 2012-07-26 NOTE — Assessment & Plan Note (Addendum)
Continue coumadin. Will discuss DC-CV in 2 weeks after 1 month anticoagulation. Unfortunately cannot afford NOAC.

## 2012-07-26 NOTE — Assessment & Plan Note (Addendum)
Much improved on milrinone. NYHA III. Volume status mildly elevated.Reinforced need for daily weights and reviewed use of sliding scale diuretics. Instructed to double lasix tomorrow and then resume lasix 40 mg daily. Provided sliding scale diuretic regimen and take additional 40 mg of lasix if his weight is 150 pounds or greater. Increase night time carvedilol to 6.25 mg.  Continue Milrinone via PICC at 0.375 mcg. Continue to titrate HF medications as he tolerates. Will consider Ace at next appointment. Follow up in 2 weeks to discuss DC-CV. After DC-CV will then need to reconsider CRT-D if EF still down.

## 2012-07-26 NOTE — Patient Instructions (Addendum)
Take 80 mg Lasix tomorrow then on Saturday go back to 40 mg daily  Take carvedilol 1/2 tab in am and 1 tab in pm.   Follow up in 2 weeks to discuss cardioversion  If weight is  150 pounds or greater take an extra Lasix  Do the following things EVERYDAY: 1) Weigh yourself in the morning before breakfast. Write it down and keep it in a log. 2) Take your medicines as prescribed 3) Eat low salt foods-Limit salt (sodium) to 2000 mg per day.  4) Stay as active as you can everyday 5) Limit all fluids for the day to less than 2 liters

## 2012-07-28 NOTE — Progress Notes (Signed)
Patient ID: Tyler Christensen, male   DOB: May 12, 1933, 79 y.o.   MRN: [id number] Patient ID: Tyler Christensen, male   DOB: 1932/08/02, 79 y.o.   MRN: [id number]  Weight Range   Baseline proBNP     HPI: Tyler Christensen is a 79 y.o. [name] (non-english speaking) w/ PMHx significant for Chronic Systolic CHF (EF 03%), NICM, LBBB, PAF (on coumadin), HTN, HLD, CVA, Pulmonary Hypertension, LAA clot, and Seizure (2/2 CVA 05/2012).    Admitted to [facility] 06/2012 with acute decompensated heart failure. As noted below diagnosed with severe pulmonary hypertension from initial RHC and placed on Milrinone and Revatio (eventually transitioned to tadalafil 40). VQ low probability for pulmonary embolus. Repeat RHC on 07/06/12 with markedly improved PVR and Cardiac Index noted. Discharged on Milrinone at 0.375 mcg via PICC. Anticipate possible DC-CV after 4 weeks of therapeutic anti-coagulation (07/08/12).  May also consider BiVICD in near future. He was not placed on an ACE-I at this point due to soft BP. Discharge Weight 146 pounds   07/02/12 RHC  RA = 15  RV = 94/10/13  PA = 93/42 (59)  PCW = 15  Fick cardiac output/index = 1.9/1.1  PVR = 23.0 Woods  SVR = 3140  FA sat = 95%  PA sat = 31%, 32%  Ao Pressure: 121/60 (84)  LV Pressure: 115/12/14   07/06/12 RHC  RA = 9  RV = 74/1/7  PA = 75/32 (47)  PCW = 34  Fick cardiac output/index = 3.9/2.1  PVR = 3.3 Woods  FA sat = 92%  PA sat = 56%, 56%   He returns for follow up. Feels much better. More energy. Denies SOB/PND/Orthopnea.  Weight at home 147-149 pounds.  Compliant with medications. Followed by [facility] for home Milrinone @ 0.375 mcg and PICC maintenance. Lab work to be drawn every Tuesday. Lives with his wife and and 3 step sons. Continues to wear life vest. Coumadin managed at [facility].    ROS: All systems negative except as listed in HPI, PMH and Problem List.  Past Medical History  Diagnosis Date  . HTN (hypertension)   . High cholesterol   . CHF  (congestive heart failure)   . BBBB (bilateral bundle branch block)   . LBBB (left bundle branch block)   . Weakness   . Tired   . Seizures 06/11/2012    new onset/notes (06/11/2012)  . SOB (shortness of breath)     "sometimes when I lay down;; related to not taking my RX" (06/11/2012)    Current Outpatient Prescriptions  Medication Sig Dispense Refill  . carvedilol (COREG) 6.25 MG tablet Take 1/2 tab in am and 1 tab in pm  60 tablet  3  . Choline Fenofibrate (TRILIPIX) 45 MG capsule Take 45 mg by mouth daily.      . digoxin (LANOXIN) 0.125 MG tablet Take 0.5 tablets (0.0625 mg total) by mouth daily.      . furosemide (LASIX) 40 MG tablet Take 1 tablet (40 mg total) by mouth daily.  30 tablet    . HYDROcodone-acetaminophen (NORCO) 5-325 MG per tablet Take 1 tablet by mouth every 6 (six) hours as needed for pain.  30 tablet  0  . loratadine (CLARITIN) 10 MG tablet Take 10 mg by mouth daily.      . milrinone (PRIMACOR) 20 MG/100ML SOLN infusion Inject 25.3125 mcg/min into the vein continuous.  100 mL    . potassium chloride SA (K-DUR,KLOR-CON) 20 MEQ tablet Take 1 tablet (  20 mEq total) by mouth daily.  30 tablet  6  . ranitidine (ZANTAC) 300 MG tablet Take 300 mg by mouth daily.      . simvastatin (ZOCOR) 20 MG tablet Take 20 mg by mouth every evening.      Marland Kitchen spironolactone (ALDACTONE) 25 MG tablet Take 25 mg by mouth daily.      . Tadalafil, PAH, 20 MG TABS Take 2 tablets (40 mg total) by mouth daily.  60 tablet  6  . warfarin (COUMADIN) 4 MG tablet Take 1 tablet (4 mg total) by mouth daily.  35 tablet  1   No current facility-administered medications for this encounter.     PHYSICAL EXAM: Filed Vitals:   07/26/12 1447  BP: 138/70  Pulse: 104  Weight: 151 lb 12.8 oz (68.856 kg)  SpO2: 96%   Spanish Interpreter  present General:  Well appearing. No resp difficulty  HEENT: normal Neck: supple. JVP 5-6. Carotids 2+ bilaterally; no bruits. No lymphadenopathy or thryomegaly  appreciated. Cor: PMI normal. Irregular rate & rhythm. No rubs, gallops or murmurs. Life Vest on Lungs: clear Abdomen: soft, nontender, nondistended. No hepatosplenomegaly. No bruits or masses. Good bowel sounds. Extremities: no cyanosis, clubbing, rash, RUE PICC  Neuro: alert & orientedx3, cranial nerves grossly intact. Moves all 4 extremities w/o difficulty. Affect pleasant.     ASSESSMENT & PLAN:

## 2012-08-08 ENCOUNTER — Ambulatory Visit: Payer: Self-pay | Admitting: Internal Medicine

## 2012-08-09 ENCOUNTER — Ambulatory Visit (HOSPITAL_COMMUNITY)
Admission: RE | Admit: 2012-08-09 | Discharge: 2012-08-09 | Disposition: A | Discharge status: Home / Self Care | Payer: Medicare Other | Source: Ambulatory Visit | Attending: Internal Medicine | Admitting: Internal Medicine

## 2012-08-09 ENCOUNTER — Encounter (HOSPITAL_COMMUNITY): Payer: Self-pay

## 2012-08-09 ENCOUNTER — Encounter (HOSPITAL_COMMUNITY): Payer: Self-pay | Admitting: *Deleted

## 2012-08-09 VITALS — BP 102/54 | HR 89 | Wt 148.0 lb

## 2012-08-09 DIAGNOSIS — I4891 Unspecified atrial fibrillation: Secondary | ICD-10-CM | POA: Insufficient documentation

## 2012-08-09 DIAGNOSIS — I5022 Chronic systolic (congestive) heart failure: Secondary | ICD-10-CM | POA: Insufficient documentation

## 2012-08-09 MED ORDER — LISINOPRIL 2.5 MG PO TABS: 2.5000 mg | ORAL_TABLET | Freq: Every day | ORAL | Status: DC

## 2012-08-09 NOTE — Assessment & Plan Note (Addendum)
INR has been therapeutic for > 4 weeks. Plan for cardioversion next week.

## 2012-08-09 NOTE — Assessment & Plan Note (Addendum)
Volume status stable. Continue current diuretic regimen. Continue home Milrinone at 0.375 mcg via PICC. Reviewed most recent lab work. Add lisinopril 2.5 mg daily. Follow up in 1 month. Will proceed with DC-CV. If he maintains NSR will refer for possible CRT-D.

## 2012-08-09 NOTE — Progress Notes (Signed)
Patient ID: Tyler Christensen, male   DOB: 08-15-1932, 79 y.o.   MRN: [id number]   Weight Range   Baseline proBNP     HPI: Mr. Alberg is a 79 y.o. [name] (non-english speaking) w/ PMHx significant for Chronic Systolic CHF (EF 11%), NICM, LBBB, PAF (on coumadin), HTN, HLD, CVA, Pulmonary Hypertension, LAA clot, and Seizure (2/2 CVA 05/2012).    Admitted to [facility] 06/2012 with acute decompensated heart failure. As noted below diagnosed with severe pulmonary hypertension from initial RHC and placed on Milrinone and Revatio (eventually transitioned to tadalafil 40). VQ low probability for pulmonary embolus. Repeat RHC on 07/06/12 with markedly improved PVR and Cardiac Index noted. Discharged on Milrinone at 0.375 mcg via PICC. Anticipate possible DC-CV after 4 weeks of therapeutic anti-coagulation (07/08/12).  May also consider BiVICD in near future. He was not placed on an ACE-I at this point due to soft BP. Discharge Weight 146 pounds   07/02/12 RHC  RA = 15  RV = 94/10/13  PA = 93/42 (59)  PCW = 15  Fick cardiac output/index = 1.9/1.1  PVR = 23.0 Woods  SVR = 3140  FA sat = 95%  PA sat = 31%, 32%  Ao Pressure: 121/60 (84)  LV Pressure: 115/12/14   07/06/12 RHC  RA = 9  RV = 74/1/7  PA = 75/32 (47)  PCW = 34  Fick cardiac output/index = 3.9/2.1  PVR = 3.3 Woods  FA sat = 92%  PA sat = 56%, 56%   He returns for follow up. Feels much better. Denies SOB/PND/Orthopnea.  Weight at home 147-149 pounds.  Compliant with medications. Followed by [facility] for home Milrinone @ 0.375 mcg and PICC maintenance. Lab work to be drawn every Tuesday. Lives with his wife and and 3 step sons. Continues to wear life vest. Coumadin managed at [facility]. INR has been therapeutic.   ROS: All systems negative except as listed in HPI, PMH and Problem List.  Past Medical History  Diagnosis Date  . HTN (hypertension)   . High cholesterol   . CHF (congestive heart failure)   . BBBB (bilateral bundle branch  block)   . LBBB (left bundle branch block)   . Weakness   . Tired   . Seizures 06/11/2012    new onset/notes (06/11/2012)  . SOB (shortness of breath)     "sometimes when I lay down;; related to not taking my RX" (06/11/2012)    Current Outpatient Prescriptions  Medication Sig Dispense Refill  . carvedilol (COREG) 6.25 MG tablet Take 1/2 tab in am and 1 tab in pm  60 tablet  3  . Choline Fenofibrate (TRILIPIX) 45 MG capsule Take 45 mg by mouth daily.      . digoxin (LANOXIN) 0.125 MG tablet Take 0.5 tablets (0.0625 mg total) by mouth daily.      . furosemide (LASIX) 40 MG tablet Take 1 tablet (40 mg total) by mouth daily.  30 tablet    . HYDROcodone-acetaminophen (NORCO) 5-325 MG per tablet Take 1 tablet by mouth every 6 (six) hours as needed for pain.  30 tablet  0  . loratadine (CLARITIN) 10 MG tablet Take 10 mg by mouth daily.      . milrinone (PRIMACOR) 20 MG/100ML SOLN infusion Inject 25.3125 mcg/min into the vein continuous.  100 mL    . potassium chloride SA (K-DUR,KLOR-CON) 20 MEQ tablet Take 1 tablet (20 mEq total) by mouth daily.  30 tablet  6  .  ranitidine (ZANTAC) 300 MG tablet Take 300 mg by mouth daily.      . simvastatin (ZOCOR) 20 MG tablet Take 20 mg by mouth every evening.      Marland Kitchen spironolactone (ALDACTONE) 25 MG tablet Take 25 mg by mouth daily.      . Tadalafil, PAH, 20 MG TABS Take 2 tablets (40 mg total) by mouth daily.  60 tablet  6  . warfarin (COUMADIN) 4 MG tablet Take 1 tablet (4 mg total) by mouth daily.  35 tablet  1   No current facility-administered medications for this encounter.     PHYSICAL EXAM: Filed Vitals:   08/09/12 1425  BP: 102/54  Pulse: 89  Weight: 148 lb (67.132 kg)  SpO2: 97%   Spanish via [employer] interpreter  present General:  Well appearing. No resp difficulty  HEENT: normal Neck: supple. JVP 5-6. Carotids 2+ bilaterally; no bruits. No lymphadenopathy or thryomegaly appreciated. Cor: PMI normal. Irregular rate & rhythm. No rubs,  gallops or murmurs. Life Vest on Lungs: clear Abdomen: soft, nontender, nondistended. No hepatosplenomegaly. No bruits or masses. Good bowel sounds. Extremities: no cyanosis, clubbing, rash, RUE PICC  Neuro: alert & orientedx3, cranial nerves grossly intact. Moves all 4 extremities w/o difficulty. Affect pleasant.     ASSESSMENT & PLAN:

## 2012-08-14 ENCOUNTER — Encounter (HOSPITAL_COMMUNITY): Payer: Self-pay | Admitting: Anesthesiology

## 2012-08-14 ENCOUNTER — Encounter (HOSPITAL_COMMUNITY)
Admission: RE | Disposition: A | Discharge status: Home / Self Care | Payer: Self-pay | Source: Ambulatory Visit | Attending: Internal Medicine

## 2012-08-14 ENCOUNTER — Ambulatory Visit (HOSPITAL_COMMUNITY): Payer: Medicare Other | Admitting: Anesthesiology

## 2012-08-14 ENCOUNTER — Ambulatory Visit (HOSPITAL_COMMUNITY)
Admission: RE | Admit: 2012-08-14 | Discharge: 2012-08-14 | Disposition: A | Discharge status: Home / Self Care | Payer: Medicare Other | Source: Ambulatory Visit | Attending: Internal Medicine | Admitting: Internal Medicine

## 2012-08-14 ENCOUNTER — Encounter (HOSPITAL_COMMUNITY): Payer: Self-pay | Admitting: Gastroenterology

## 2012-08-14 ENCOUNTER — Other Ambulatory Visit (HOSPITAL_COMMUNITY): Payer: Self-pay | Admitting: Adult Health

## 2012-08-14 DIAGNOSIS — I4891 Unspecified atrial fibrillation: Secondary | ICD-10-CM | POA: Insufficient documentation

## 2012-08-14 DIAGNOSIS — I10 Essential (primary) hypertension: Secondary | ICD-10-CM | POA: Insufficient documentation

## 2012-08-14 DIAGNOSIS — Z8673 Personal history of transient ischemic attack (TIA), and cerebral infarction without residual deficits: Secondary | ICD-10-CM | POA: Insufficient documentation

## 2012-08-14 DIAGNOSIS — I5022 Chronic systolic (congestive) heart failure: Secondary | ICD-10-CM | POA: Insufficient documentation

## 2012-08-14 DIAGNOSIS — I428 Other cardiomyopathies: Secondary | ICD-10-CM | POA: Insufficient documentation

## 2012-08-14 DIAGNOSIS — Z7901 Long term (current) use of anticoagulants: Secondary | ICD-10-CM | POA: Insufficient documentation

## 2012-08-14 DIAGNOSIS — I4892 Unspecified atrial flutter: Secondary | ICD-10-CM | POA: Insufficient documentation

## 2012-08-14 DIAGNOSIS — E785 Hyperlipidemia, unspecified: Secondary | ICD-10-CM | POA: Insufficient documentation

## 2012-08-14 DIAGNOSIS — Z79899 Other long term (current) drug therapy: Secondary | ICD-10-CM | POA: Insufficient documentation

## 2012-08-14 DIAGNOSIS — I447 Left bundle-branch block, unspecified: Secondary | ICD-10-CM | POA: Insufficient documentation

## 2012-08-14 HISTORY — PX: CARDIOVERSION: SHX1299

## 2012-08-14 HISTORY — DX: Acute myocardial infarction, unspecified: I21.9

## 2012-08-14 LAB — BASIC METABOLIC PANEL
BUN: 33 mg/dL — ABNORMAL HIGH (ref 6–23)
Calcium: 9.5 mg/dL (ref 8.4–10.5)
GFR calc non Af Amer: 47 mL/min — ABNORMAL LOW (ref >90)
Glucose, Bld: 104 mg/dL — ABNORMAL HIGH (ref 70–99)

## 2012-08-14 LAB — CBC
Hemoglobin: 14.5 g/dL (ref 13.0–17.0)
MCH: 31.3 pg (ref 26.0–34.0)
MCHC: 34.2 g/dL (ref 30.0–36.0)
Platelets: 256 10*3/uL (ref 150–400)

## 2012-08-14 LAB — PROTIME-INR: Prothrombin Time: 32.8 seconds — ABNORMAL HIGH (ref 11.6–15.2)

## 2012-08-14 SURGERY — CARDIOVERSION
Anesthesia: Monitor Anesthesia Care

## 2012-08-14 MED ORDER — ETOMIDATE 2 MG/ML IV SOLN
INTRAVENOUS | Status: DC | PRN
  Administered 2012-08-14: 10 mg via INTRAVENOUS

## 2012-08-14 NOTE — Interval H&P Note (Signed)
History and Physical Interval Note:  08/14/2012 1:14 PM  Tyler Christensen  has presented today for surgery, with the diagnosis of a-fib  The various methods of treatment have been discussed with the patient and family. After consideration of risks, benefits and other options for treatment, the patient has consented to  Procedure(s): CARDIOVERSION (N/A) as a surgical intervention .  The patient's history has been reviewed, patient examined, no change in status, stable for surgery.  I have reviewed the patient's chart and labs.  Questions were answered to the patient's satisfaction.     Tenishia Ekman

## 2012-08-14 NOTE — Anesthesia Postprocedure Evaluation (Signed)
  Anesthesia Post-op Note  Patient: Tyler Christensen  Procedure(s) Performed: Procedure(s): CARDIOVERSION (N/A)  Patient Location: Short Stay  Anesthesia Type:General  Level of Consciousness: awake, alert  and oriented  Airway and Oxygen Therapy: Patient Spontanous Breathing and Patient connected to nasal cannula oxygen  Post-op Pain: none  Post-op Assessment: Post-op Vital signs reviewed, Patient's Cardiovascular Status Stable, Respiratory Function Stable, Patent Airway, No signs of Nausea or vomiting and Adequate PO intake  Post-op Vital Signs: Reviewed and stable  Complications: No apparent anesthesia complications

## 2012-08-14 NOTE — H&P (View-Only) (Signed)
Patient ID: Tyler Christensen, male   DOB: 03/12/1933, 79 y.o.   MRN: [id number] Patient ID: Tyler Christensen, male   DOB: 06/25/1932, 79 y.o.   MRN: [id number]  Weight Range   Baseline proBNP     HPI: Tyler Christensen is a 79 y.o. [place] male (non-english speaking) w/ PMHx significant for Chronic Systolic CHF (EF 20%), NICM, LBBB, PAF (on coumadin), HTN, HLD, CVA, Pulmonary Hypertension, LAA clot, and Seizure (2/2 CVA 05/2012).    Admitted to [facility] 06/2012 with acute decompensated heart failure. As noted below diagnosed with severe pulmonary hypertension from initial RHC and placed on Milrinone and Revatio (eventually transitioned to tadalafil 40). VQ low probability for pulmonary embolus. Repeat RHC on 07/06/12 with markedly improved PVR and Cardiac Index noted. Discharged on Milrinone at 0.375 mcg via PICC. Anticipate possible DC-CV after 4 weeks of therapeutic anti-coagulation (07/08/12).  May also consider BiVICD in near future. He was not placed on an ACE-I at this point due to soft BP. Discharge Weight 146 pounds   07/02/12 RHC  RA = 15  RV = 94/10/13  PA = 93/42 (59)  PCW = 15  Fick cardiac output/index = 1.9/1.1  PVR = 23.0 Woods  SVR = 3140  FA sat = 95%  PA sat = 31%, 32%  Ao Pressure: 121/60 (84)  LV Pressure: 115/12/14   07/06/12 RHC  RA = 9  RV = 74/1/7  PA = 75/32 (47)  PCW = 34  Fick cardiac output/index = 3.9/2.1  PVR = 3.3 Woods  FA sat = 92%  PA sat = 56%, 56%   He returns for follow up. Feels much better. More energy. Denies SOB/PND/Orthopnea.  Weight at home 147-149 pounds.  Compliant with medications. Followed by [facility] for home Milrinone @ 0.375 mcg and PICC maintenance. Lab work to be drawn every Tuesday. Lives with his wife and and 3 step sons. Continues to wear life vest. Coumadin managed at [facility].    ROS: All systems negative except as listed in HPI, PMH and Problem List.  Past Medical History  Diagnosis Date  . HTN (hypertension)   . High cholesterol   . CHF  (congestive heart failure)   . BBBB (bilateral bundle branch block)   . LBBB (left bundle branch block)   . Weakness   . Tired   . Seizures 06/11/2012    new onset/notes (06/11/2012)  . SOB (shortness of breath)     "sometimes when I lay down;; related to not taking my RX" (06/11/2012)    Current Outpatient Prescriptions  Medication Sig Dispense Refill  . carvedilol (COREG) 6.25 MG tablet Take 1/2 tab in am and 1 tab in pm  60 tablet  3  . Choline Fenofibrate (TRILIPIX) 45 MG capsule Take 45 mg by mouth daily.      . digoxin (LANOXIN) 0.125 MG tablet Take 0.5 tablets (0.0625 mg total) by mouth daily.      . furosemide (LASIX) 40 MG tablet Take 1 tablet (40 mg total) by mouth daily.  30 tablet    . HYDROcodone-acetaminophen (NORCO) 5-325 MG per tablet Take 1 tablet by mouth every 6 (six) hours as needed for pain.  30 tablet  0  . loratadine (CLARITIN) 10 MG tablet Take 10 mg by mouth daily.      . milrinone (PRIMACOR) 20 MG/100ML SOLN infusion Inject 25.3125 mcg/min into the vein continuous.  100 mL    . potassium chloride SA (K-DUR,KLOR-CON) 20 MEQ tablet Take 1 tablet (  20 mEq total) by mouth daily.  30 tablet  6  . ranitidine (ZANTAC) 300 MG tablet Take 300 mg by mouth daily.      . simvastatin (ZOCOR) 20 MG tablet Take 20 mg by mouth every evening.      . spironolactone (ALDACTONE) 25 MG tablet Take 25 mg by mouth daily.      . Tadalafil, PAH, 20 MG TABS Take 2 tablets (40 mg total) by mouth daily.  60 tablet  6  . warfarin (COUMADIN) 4 MG tablet Take 1 tablet (4 mg total) by mouth daily.  35 tablet  1   No current facility-administered medications for this encounter.     PHYSICAL EXAM: Filed Vitals:   07/26/12 1447  BP: 138/70  Pulse: 104  Weight: 151 lb 12.8 oz (68.856 kg)  SpO2: 96%   Spanish Interpreter  present General:  Well appearing. No resp difficulty  HEENT: normal Neck: supple. JVP 5-6. Carotids 2+ bilaterally; no bruits. No lymphadenopathy or thryomegaly  appreciated. Cor: PMI normal. Irregular rate & rhythm. No rubs, gallops or murmurs. Life Vest on Lungs: clear Abdomen: soft, nontender, nondistended. No hepatosplenomegaly. No bruits or masses. Good bowel sounds. Extremities: no cyanosis, clubbing, rash, RUE PICC  Neuro: alert & orientedx3, cranial nerves grossly intact. Moves all 4 extremities w/o difficulty. Affect pleasant.     ASSESSMENT & PLAN:  

## 2012-08-14 NOTE — CV Procedure (Signed)
     DIRECT CURRENT CARDIOVERSION  NAME:  Tyler Christensen   MRN: [id number] DOB:  1933/04/18   ADMIT DATE: 08/14/2012   INDICATIONS: Atrial fibrillation    PROCEDURE:   Informed consent was obtained prior to the procedure. The risks, benefits and alternatives for the procedure were discussed and the patient comprehended these risks. Once an appropriate time out was taken, the patient had the defibrillator pads placed in the anterior and posterior position. The patient then underwent sedation by the anesthesia service with 10mg  of etomidate. Once an appropriate level of sedation was achieved, the patient received a biphasic, synchronized 150J shock with conversion in to what appeared to be atrial flutter. He then received another synchronized 200J shock and appeared to have several beats of NSR and then back into possible AFL or Atach. ECG pending. Will review with EP.   Florencia Zaccaro,MD 1:37 PM

## 2012-08-14 NOTE — Anesthesia Preprocedure Evaluation (Signed)
Anesthesia Evaluation  Patient identified by MRN, date of birth, ID band Patient awake    Reviewed: Allergy & Precautions, H&P , NPO status , Patient's Chart, lab work & pertinent test results  Airway Mallampati: I TM Distance: >3 FB Neck ROM: Full    Dental   Pulmonary          Cardiovascular hypertension, Pt. on medications + Past MI and +CHF + dysrhythmias     Neuro/Psych    GI/Hepatic   Endo/Other    Renal/GU      Musculoskeletal   Abdominal   Peds  Hematology   Anesthesia Other Findings   Reproductive/Obstetrics                           Anesthesia Physical Anesthesia Plan  ASA: IV  Anesthesia Plan: General   Post-op Pain Management:    Induction: Intravenous  Airway Management Planned: Mask  Additional Equipment:   Intra-op Plan:   Post-operative Plan:   Informed Consent: I have reviewed the patients History and Physical, chart, labs and discussed the procedure including the risks, benefits and alternatives for the proposed anesthesia with the patient or authorized representative who has indicated his/her understanding and acceptance.     Plan Discussed with: CRNA and Surgeon  Anesthesia Plan Comments:         Anesthesia Quick Evaluation

## 2012-08-14 NOTE — Transfer of Care (Signed)
Immediate Anesthesia Transfer of Care Note  Patient: Tyler Christensen  Procedure(s) Performed: Procedure(s): CARDIOVERSION (N/A)  Patient Location: Short Stay  Anesthesia Type:General  Level of Consciousness: awake, alert  and sedated  Airway & Oxygen Therapy: Patient Spontanous Breathing and Patient connected to nasal cannula oxygen  Post-op Assessment: Report given to PACU RN, Post -op Vital signs reviewed and stable and Patient moving all extremities X 4  Post vital signs: Reviewed and stable  Complications: No apparent anesthesia complications

## 2012-08-15 ENCOUNTER — Encounter (HOSPITAL_COMMUNITY): Payer: Self-pay | Admitting: Internal Medicine

## 2012-08-16 ENCOUNTER — Ambulatory Visit (HOSPITAL_COMMUNITY)
Admission: RE | Admit: 2012-08-16 | Discharge: 2012-08-16 | Disposition: A | Discharge status: Home / Self Care | Payer: Medicare Other | Source: Ambulatory Visit | Attending: Internal Medicine | Admitting: Internal Medicine

## 2012-08-16 VITALS — BP 94/58 | HR 91 | Resp 16

## 2012-08-16 DIAGNOSIS — I4891 Unspecified atrial fibrillation: Secondary | ICD-10-CM | POA: Insufficient documentation

## 2012-08-16 MED ORDER — DIGOXIN 125 MCG PO TABS: 0.0625 mg | ORAL_TABLET | Freq: Every day | ORAL | Status: DC

## 2012-08-16 NOTE — Progress Notes (Signed)
Pt walked into clinic today c/o rash and itching, there is a red rash over his stomach, back, arms and legs, he states through his son who speaks english that he itches badly and is unable to sleep, he reports this started last week when he started taking the Lisinopril.  Discussed w/Dr Bensimhon, will d/c Lisinopril, pt adn son aware and verbalize understanding.  EKG was done and reviewed by Dr Gala Romney, follow up appt sch for 3/11

## 2012-08-17 ENCOUNTER — Encounter: Payer: Self-pay | Admitting: Internal Medicine

## 2012-08-17 NOTE — Addendum Note (Signed)
Encounter addended by: Tewana Bohlen, CCT on: 08/17/2012  9:06 AM<BR>     Documentation filed: Charges VN

## 2012-08-20 ENCOUNTER — Ambulatory Visit: Payer: Self-pay | Admitting: Cardiovascular Disease

## 2012-08-21 NOTE — Addendum Note (Signed)
Encounter addended by: Dolores Patty, MD on: 08/21/2012 12:20 AM<BR>     Documentation filed: Visit Diagnoses

## 2012-08-27 ENCOUNTER — Ambulatory Visit (INDEPENDENT_AMBULATORY_CARE_PROVIDER_SITE_OTHER): Payer: Medicare Other | Admitting: Cardiology

## 2012-08-27 DIAGNOSIS — I4891 Unspecified atrial fibrillation: Secondary | ICD-10-CM

## 2012-08-28 ENCOUNTER — Encounter (HOSPITAL_COMMUNITY): Payer: Self-pay

## 2012-08-28 ENCOUNTER — Ambulatory Visit (HOSPITAL_COMMUNITY)
Admission: RE | Admit: 2012-08-28 | Discharge: 2012-08-28 | Disposition: A | Discharge status: Home / Self Care | Payer: Medicare Other | Source: Ambulatory Visit | Attending: Internal Medicine | Admitting: Internal Medicine

## 2012-08-28 VITALS — BP 98/52 | HR 80 | Wt 152.4 lb

## 2012-08-28 DIAGNOSIS — I5022 Chronic systolic (congestive) heart failure: Secondary | ICD-10-CM | POA: Insufficient documentation

## 2012-08-28 DIAGNOSIS — I4891 Unspecified atrial fibrillation: Secondary | ICD-10-CM | POA: Insufficient documentation

## 2012-08-28 MED ORDER — FUROSEMIDE 40 MG PO TABS: 80.0000 mg | ORAL_TABLET | Freq: Every day | ORAL | Status: DC

## 2012-08-28 MED ORDER — POTASSIUM CHLORIDE CRYS ER 20 MEQ PO TBCR: 40.0000 meq | EXTENDED_RELEASE_TABLET | Freq: Every day | ORAL | Status: DC

## 2012-08-28 NOTE — Patient Instructions (Addendum)
Furosemide dos tabletas al dia  Potassium dos tabletas al dia.  Metolazone una tableta hoy y [place]

## 2012-08-28 NOTE — Assessment & Plan Note (Signed)
Volume status elevated but unsure of insult.  Will give 2 days of metolazone 2.5 mg and increase lasix 2 tabs (80 mg) daily.  He will monitor weight and if this falls drastically or he becomes dizzy he will cut back to 1 daily.  Will also increase potassium to 40 mEq daily.  He voices understanding and will call if there is an issue.  Re-educated on low sodium choices and fluid restrictions.  Continue Lifevest at this time.  Will discuss ICD implantation once volume status controlled.  Will follow up next week to reassess volume.  BMETs weekly through [facility].

## 2012-08-28 NOTE — Assessment & Plan Note (Signed)
Continue coumadin.  Follow up in CVRR clinic.

## 2012-08-28 NOTE — Progress Notes (Signed)
HPI: Mr. Neuser is a 79 y.o. [name] (non-english speaking) w/ PMHx significant for Chronic Systolic CHF (EF 16%), NICM, LBBB, PAF (on coumadin), HTN, HLD, CVA, Pulmonary Hypertension, LAA clot, and Seizure (2/2 CVA 05/2012).    Admitted to [facility] 06/2012 with acute decompensated heart failure. As noted below diagnosed with severe pulmonary hypertension from initial RHC and placed on Milrinone and Revatio (eventually transitioned to tadalafil 40). VQ low probability for pulmonary embolus. Repeat RHC on 07/06/12 with markedly improved PVR and Cardiac Index noted. Discharged on Milrinone at 0.375 mcg via PICC. Anticipate possible DC-CV after 4 weeks of therapeutic anti-coagulation (07/08/12).  May also consider BiVICD in near future. He was not placed on an ACE-I at this point due to soft BP. Discharge Weight 146 pounds   07/02/12 RHC  RA = 15  RV = 94/10/13  PA = 93/42 (59)  PCW = 15  Fick cardiac output/index = 1.9/1.1  PVR = 23.0 Woods  SVR = 3140  FA sat = 95%  PA sat = 31%, 32%  Ao Pressure: 121/60 (84)  LV Pressure: 115/12/14   07/06/12 RHC  RA = 9  RV = 74/1/7  PA = 75/32 (47)  PCW = 34  Fick cardiac output/index = 3.9/2.1  PVR = 3.3 Woods  FA sat = 92%  PA sat = 56%, 56%   He returns for follow up today.  He is here with an interpreter.  Progressive dyspnea over the last 5 days.  He tried to take an extra lasix without improvement in his symptoms.  He also has dyspnea with minimal activity, orthopnea and PND.  He continues to get home milrinone 0.375 mcg/kg/min and is followed through [facility].  Wearing Lifevest but no alarms.      Labs 08/20/12: Cr 1.14, BUN 19, Mg 1.6   ROS: All systems negative except as listed in HPI, PMH and Problem List.  Past Medical History  Diagnosis Date  . HTN (hypertension)   . High cholesterol   . CHF (congestive heart failure)   . BBBB (bilateral bundle branch block)   . LBBB (left bundle branch block)   . Weakness   . Tired   . Seizures  06/11/2012    new onset/notes (06/11/2012)  . SOB (shortness of breath)     "sometimes when I lay down;; related to not taking my RX" (06/11/2012)  . Myocardial infarction     06/10/2012    Current Outpatient Prescriptions  Medication Sig Dispense Refill  . carvedilol (COREG) 6.25 MG tablet Take 1/2 tab in am and 1 tab in pm  60 tablet  3  . Choline Fenofibrate (TRILIPIX) 45 MG capsule Take 45 mg by mouth daily.      . digoxin (LANOXIN) 0.125 MG tablet Take 0.5 tablets (0.0625 mg total) by mouth daily.  15 tablet  3  . furosemide (LASIX) 40 MG tablet Take 1 tablet (40 mg total) by mouth daily.  30 tablet    . HYDROcodone-acetaminophen (NORCO) 5-325 MG per tablet Take 1 tablet by mouth every 6 (six) hours as needed for pain.  30 tablet  0  . loratadine (CLARITIN) 10 MG tablet Take 10 mg by mouth daily.      . milrinone (PRIMACOR) 20 MG/100ML SOLN infusion Inject 25.3125 mcg/min into the vein continuous.  100 mL    . potassium chloride SA (K-DUR,KLOR-CON) 20 MEQ tablet Take 1 tablet (20 mEq total) by mouth daily.  30 tablet  6  . ranitidine (  ZANTAC) 300 MG tablet Take 300 mg by mouth daily.      . simvastatin (ZOCOR) 20 MG tablet Take 40 mg by mouth daily.       Marland Kitchen spironolactone (ALDACTONE) 25 MG tablet Take 25 mg by mouth daily.      . Tadalafil, PAH, 20 MG TABS Take 2 tablets (40 mg total) by mouth daily.  60 tablet  6  . warfarin (COUMADIN) 4 MG tablet Take 1 tablet (4 mg total) by mouth daily.  35 tablet  1   No current facility-administered medications for this encounter.     PHYSICAL EXAM: Filed Vitals:   08/28/12 1045  BP: 98/52  Pulse: 80  Weight: 152 lb 6.4 oz (69.128 kg)  SpO2: 95%   General:  Well appearing. No resp difficulty  HEENT: normal Neck: supple. JVP 9-10. Carotids 2+ bilaterally; no bruits. No lymphadenopathy or thryomegaly appreciated. Cor: PMI normal. Irregular rate & rhythm. No rubs, gallops or murmurs. Life Vest on Lungs: clear Abdomen: soft, nontender,  nondistended. No hepatosplenomegaly. No bruits or masses. Good bowel sounds. Extremities: no cyanosis, clubbing, rash, 1+ lower extremity edema L>R.  RUE PICC  Neuro: alert & orientedx3, cranial nerves grossly intact. Moves all 4 extremities w/o difficulty. Affect pleasant.     ASSESSMENT & PLAN:

## 2012-09-04 ENCOUNTER — Ambulatory Visit (HOSPITAL_COMMUNITY)
Admission: RE | Admit: 2012-09-04 | Discharge: 2012-09-04 | Disposition: A | Discharge status: Home / Self Care | Payer: Medicare Other | Source: Ambulatory Visit | Attending: Internal Medicine | Admitting: Internal Medicine

## 2012-09-04 VITALS — BP 96/58 | HR 84 | Wt 149.8 lb

## 2012-09-04 DIAGNOSIS — I5022 Chronic systolic (congestive) heart failure: Secondary | ICD-10-CM | POA: Insufficient documentation

## 2012-09-04 NOTE — Patient Instructions (Addendum)
Take take an additional 40 mg of lasix if your weight is 149 pounds  Do the following things EVERYDAY: 1) Weigh yourself in the morning before breakfast. Write it down and keep it in a log. 2) Take your medicines as prescribed 3) Eat low salt foods-Limit salt (sodium) to 2000 mg per day.  4) Stay as active as you can everyday 5) Limit all fluids for the day to less than 2 liters  Follow up in 2 weeks.

## 2012-09-04 NOTE — Assessment & Plan Note (Addendum)
NYHA II. Mild dyspnea going up steps. Volume status improved. Continue current diuretic regimen. Continue Milrinone at 0.375 mcg via PICC. Will not titrate HF medications due to soft BP. He is not on lisinopril or spironolactone as previously ordered. Reviewed BMET from [facility] potassium 4.3 , creatinine 1.29, and Magnesium 1.9. He is instructed to take an additional 40 mg of lasix if his weight is 149 pounds or greater. Follow up in 2 weeks and consider referral to EP if volume remains stable.

## 2012-09-04 NOTE — Progress Notes (Signed)
Patient ID: Tyler Christensen, male   DOB: 1933-04-19, 79 y.o.   MRN: [id number]   HPI: Tyler Christensen is a 79 y.o. [name] (non-english speaking) w/ PMHx significant for Chronic Systolic CHF (EF 14%), NICM, LBBB, PAF (on coumadin), HTN, HLD, CVA, Pulmonary Hypertension, LAA clot, and Seizure (2/2 CVA 05/2012).   Admitted to [facility] 06/2012 with acute decompensated heart failure. As noted below diagnosed with severe pulmonary hypertension from initial RHC and placed on Milrinone and Revatio (eventually transitioned to tadalafil 40). VQ low probability for pulmonary embolus. Repeat RHC on 07/06/12 with markedly improved PVR and Cardiac Index noted. Discharged on Milrinone at 0.375 mcg via PICC. Anticipate possible DC-CV after 4 weeks of therapeutic anti-coagulation (07/08/12).  May also consider BiVICD in near future. He was not placed on an ACE-I at this point due to soft BP. Discharge Weight 146 pounds   07/02/12 RHC  RA = 15  RV = 94/10/13  PA = 93/42 (59)  PCW = 15  Fick cardiac output/index = 1.9/1.1  PVR = 23.0 Woods  SVR = 3140  FA sat = 95%  PA sat = 31%, 32%  Ao Pressure: 121/60 (84)  LV Pressure: 115/12/14   07/06/12 RHC  RA = 9  RV = 74/1/7  PA = 75/32 (47)  PCW = 34  Fick cardiac output/index = 3.9/2.1  PVR = 3.3 Woods  FA sat = 92%  PA sat = 56%, 56%   08/14/12 S/P Unsuccessful DC-CV   He returns for follow up today.  Last visit he was given Metolazone for 2 days and Lasix was increased to 80 mg twice a day. He is here with an interpreter.  Denies SOB/PND/Orthopnea. Weight at home 146-149. Progressive dyspnea over the last 5 days.   He continues to get home milrinone 0.375 mcg/kg/min and is followed through [facility].  Wearing Lifevest but no alarms.    Reviewed medication bottles he is only taking 6.25 mg Carvedilol daily, not taking Spironolactone, and not taking currently lisinopril as previously ordered.    ROS: All systems negative except as listed in HPI, PMH and Problem List.  Past  Medical History  Diagnosis Date  . HTN (hypertension)   . High cholesterol   . CHF (congestive heart failure)   . BBBB (bilateral bundle branch block)   . LBBB (left bundle branch block)   . Weakness   . Tired   . Seizures 06/11/2012    new onset/notes (06/11/2012)  . SOB (shortness of breath)     "sometimes when I lay down;; related to not taking my RX" (06/11/2012)  . Myocardial infarction     06/10/2012    Current Outpatient Prescriptions  Medication Sig Dispense Refill  . carvedilol (COREG) 6.25 MG tablet Take 6.25 mg by mouth daily. Take 1/2 tab in am and 1 tab in pm      . Choline Fenofibrate (TRILIPIX) 45 MG capsule Take 45 mg by mouth daily.      . digoxin (LANOXIN) 0.125 MG tablet Take 0.5 tablets (0.0625 mg total) by mouth daily.  15 tablet  3  . furosemide (LASIX) 40 MG tablet Take 2 tablets (80 mg total) by mouth daily.  30 tablet    . milrinone (PRIMACOR) 20 MG/100ML SOLN infusion Inject 25.3125 mcg/min into the vein continuous.  100 mL    . potassium chloride SA (K-DUR,KLOR-CON) 20 MEQ tablet Take 2 tablets (40 mEq total) by mouth daily.  30 tablet  6  . simvastatin (ZOCOR) 20  MG tablet Take 40 mg by mouth daily.       . Tadalafil, PAH, 20 MG TABS Take 2 tablets (40 mg total) by mouth daily.  60 tablet  6  . warfarin (COUMADIN) 4 MG tablet Take 1 tablet (4 mg total) by mouth daily.  35 tablet  1   No current facility-administered medications for this encounter.     PHYSICAL EXAM: Filed Vitals:   09/04/12 1042  BP: 96/58  Pulse: 84  Weight: 149 lb 12 oz (67.926 kg)  SpO2: 94%   General:  Well appearing. No resp difficulty  HEENT: normal Neck: supple. JVP 5-6 Carotids 2+ bilaterally; no bruits. No lymphadenopathy or thryomegaly appreciated. Cor: PMI normal. Irregular rate & rhythm. No rubs, gallops or murmurs. Life Vest on Lungs: clear Abdomen: soft, nontender, nondistended. No hepatosplenomegaly. No bruits or masses. Good bowel sounds. Extremities: no  cyanosis, clubbing, rash, extremity edema  RUE PICC  Neuro: alert & orientedx3, cranial nerves grossly intact. Moves all 4 extremities w/o difficulty. Affect pleasant.     ASSESSMENT & PLAN:

## 2012-09-11 DIAGNOSIS — I4891 Unspecified atrial fibrillation: Secondary | ICD-10-CM

## 2012-09-11 DIAGNOSIS — N179 Acute kidney failure, unspecified: Secondary | ICD-10-CM

## 2012-09-11 DIAGNOSIS — I509 Heart failure, unspecified: Secondary | ICD-10-CM

## 2012-09-11 DIAGNOSIS — I5023 Acute on chronic systolic (congestive) heart failure: Secondary | ICD-10-CM

## 2012-09-11 LAB — PROTIME-INR: INR: 3.5 — AB (ref 0.9–1.1)

## 2012-09-12 ENCOUNTER — Ambulatory Visit (INDEPENDENT_AMBULATORY_CARE_PROVIDER_SITE_OTHER): Payer: Medicare Other | Admitting: Cardiology

## 2012-09-12 ENCOUNTER — Encounter: Payer: Self-pay | Admitting: Internal Medicine

## 2012-09-12 DIAGNOSIS — I4891 Unspecified atrial fibrillation: Secondary | ICD-10-CM

## 2012-09-21 ENCOUNTER — Encounter (HOSPITAL_COMMUNITY): Payer: Self-pay

## 2012-09-21 ENCOUNTER — Ambulatory Visit (HOSPITAL_COMMUNITY)
Admission: RE | Admit: 2012-09-21 | Discharge: 2012-09-21 | Disposition: A | Discharge status: Home / Self Care | Payer: Medicare Other | Source: Ambulatory Visit | Attending: Internal Medicine | Admitting: Internal Medicine

## 2012-09-21 VITALS — BP 116/52 | HR 82 | Wt 149.4 lb

## 2012-09-21 DIAGNOSIS — I5022 Chronic systolic (congestive) heart failure: Secondary | ICD-10-CM | POA: Insufficient documentation

## 2012-09-21 DIAGNOSIS — I10 Essential (primary) hypertension: Secondary | ICD-10-CM | POA: Insufficient documentation

## 2012-09-21 DIAGNOSIS — I4891 Unspecified atrial fibrillation: Secondary | ICD-10-CM

## 2012-09-21 DIAGNOSIS — I2721 Secondary pulmonary arterial hypertension: Secondary | ICD-10-CM

## 2012-09-21 DIAGNOSIS — I509 Heart failure, unspecified: Secondary | ICD-10-CM | POA: Insufficient documentation

## 2012-09-21 DIAGNOSIS — I2789 Other specified pulmonary heart diseases: Secondary | ICD-10-CM | POA: Insufficient documentation

## 2012-09-21 MED ORDER — SPIRONOLACTONE 25 MG PO TABS: 12.5000 mg | ORAL_TABLET | Freq: Every day | ORAL | Status: DC

## 2012-09-21 NOTE — Patient Instructions (Addendum)
Your physician has requested that you have an echocardiogram. Echocardiography is a painless test that uses sound waves to create images of your heart. It provides your doctor with information about the size and shape of your heart and how well your heart's chambers and valves are working. This procedure takes approximately one hour. There are no restrictions for this procedure.  Follow up with Dr. Ladona Ridgel  Follow up with Dr. Gala Romney in 1 month  Add spironolactone 0.5 tablet

## 2012-09-24 ENCOUNTER — Ambulatory Visit (INDEPENDENT_AMBULATORY_CARE_PROVIDER_SITE_OTHER): Payer: Medicare Other | Admitting: Cardiology

## 2012-09-24 DIAGNOSIS — I4891 Unspecified atrial fibrillation: Secondary | ICD-10-CM

## 2012-09-24 NOTE — Assessment & Plan Note (Signed)
He continues to do well on home milrinone. Volume status looks good. Will get echo. If EF < 35% will refer back to Dr. Ladona Ridgel for consideration of CRT-D (and removal of LifeVest). Will start spironolactone 12.5 daily.

## 2012-09-24 NOTE — Assessment & Plan Note (Signed)
Maintaining SR on amiodarone. Continue coumadin.  

## 2012-09-24 NOTE — Progress Notes (Signed)
HPI: Tyler Christensen is a 79 y.o. [name] (non-english speaking) w/ PMHx significant for Chronic Systolic CHF (EF 16%), NICM, LBBB, PAF (on coumadin), HTN, HLD, CVA, Pulmonary Hypertension, LAA clot, and Seizure (2/2 CVA 05/2012).   Admitted to [facility] 06/2012 with acute decompensated heart failure. As noted below diagnosed with severe pulmonary hypertension from initial RHC and placed on Milrinone and Revatio (eventually transitioned to tadalafil 40). VQ low probability for pulmonary embolus. Repeat RHC on 07/06/12 with markedly improved PVR and Cardiac Index noted. Discharged on Milrinone at 0.375 mcg via PICC. Anticipate possible DC-CV after 4 weeks of therapeutic anti-coagulation (07/08/12).  May also consider BiVICD in near future. He was not placed on an ACE-I at this point due to soft BP. Discharge Weight 146 pounds   07/02/12 RHC  RA = 15  RV = 94/10/13  PA = 93/42 (59)  PCW = 15  Fick cardiac output/index = 1.9/1.1  PVR = 23.0 Woods  SVR = 3140  FA sat = 95%  PA sat = 31%, 32%  Ao Pressure: 121/60 (84)  LV Pressure: 115/12/14   07/06/12 RHC  RA = 9  RV = 74/1/7  PA = 75/32 (47)  PCW = 34  Fick cardiac output/index = 3.9/2.1  PVR = 3.3 Woods  FA sat = 92%  PA sat = 56%, 56%   08/14/12 S/P successful DC-CV   He returns for follow up today.  With interpreter.  He feels well today. States he has his good days and bad days.  Denies orthopnea or PND.  Walking around his house and a little outside.  Minimal edema.  No extra lasix.  Weight 146-147 pounds.  Continues on milrinone 0.375 mcg/kg/min.  Wearing LifeVest no alarms. No syncope.  NO ACE-I 2/2 rash   ROS: All systems negative except as listed in HPI, PMH and Problem List.  Past Medical History  Diagnosis Date  . HTN (hypertension)   . High cholesterol   . CHF (congestive heart failure)   . BBBB (bilateral bundle branch block)   . LBBB (left bundle branch block)   . Weakness   . Tired   . Seizures 06/11/2012    new  onset/notes (06/11/2012)  . SOB (shortness of breath)     "sometimes when I lay down;; related to not taking my RX" (06/11/2012)  . Myocardial infarction     06/10/2012    Current Outpatient Prescriptions  Medication Sig Dispense Refill  . carvedilol (COREG) 6.25 MG tablet Take 6.25 mg by mouth daily. Take 1/2 tab in am and 1 tab in pm      . Choline Fenofibrate (TRILIPIX) 45 MG capsule Take 45 mg by mouth daily.      . digoxin (LANOXIN) 0.125 MG tablet Take 0.5 tablets (0.0625 mg total) by mouth daily.  15 tablet  3  . furosemide (LASIX) 40 MG tablet Take 2 tablets (80 mg total) by mouth daily.  30 tablet    . milrinone (PRIMACOR) 20 MG/100ML SOLN infusion Inject 25.3125 mcg/min into the vein continuous.  100 mL    . potassium chloride SA (K-DUR,KLOR-CON) 20 MEQ tablet Take 2 tablets (40 mEq total) by mouth daily.  30 tablet  6  . simvastatin (ZOCOR) 20 MG tablet Take 40 mg by mouth daily.       . Tadalafil, PAH, 20 MG TABS Take 2 tablets (40 mg total) by mouth daily.  60 tablet  6  . warfarin (COUMADIN) 4 MG tablet Take 1 tablet (  4 mg total) by mouth daily.  35 tablet  1  . spironolactone (ALDACTONE) 25 MG tablet Take 0.5 tablets (12.5 mg total) by mouth daily.  30 tablet  3   No current facility-administered medications for this encounter.     PHYSICAL EXAM: Filed Vitals:   09/21/12 1020  BP: 116/52  Pulse: 82  Weight: 149 lb 6.4 oz (67.767 kg)  SpO2: 94%   General:  Well appearing. No resp difficulty  HEENT: normal Neck: supple. JVP 5-6 Carotids 2+ bilaterally; no bruits. No lymphadenopathy or thryomegaly appreciated. Cor: PMI normal. Irregular rate & rhythm. No rubs, gallops or murmurs. Life Vest on Lungs: clear Abdomen: soft, nontender, nondistended. No hepatosplenomegaly. No bruits or masses. Good bowel sounds. Extremities: no cyanosis, clubbing, rash, extremity edema  RUE PICC  Neuro: alert & orientedx3, cranial nerves grossly intact. Moves all 4 extremities w/o  difficulty. Affect pleasant.     ASSESSMENT & PLAN:

## 2012-09-24 NOTE — Assessment & Plan Note (Signed)
Hemodynamics much improved on tadalafil and milrinone.

## 2012-09-26 ENCOUNTER — Encounter: Payer: Self-pay | Admitting: Internal Medicine

## 2012-09-28 ENCOUNTER — Ambulatory Visit (HOSPITAL_COMMUNITY)
Admission: RE | Admit: 2012-09-28 | Discharge: 2012-09-28 | Disposition: A | Discharge status: Home / Self Care | Payer: Medicare Other | Source: Ambulatory Visit | Attending: Internal Medicine | Admitting: Internal Medicine

## 2012-09-28 DIAGNOSIS — I4891 Unspecified atrial fibrillation: Secondary | ICD-10-CM

## 2012-09-28 DIAGNOSIS — R0609 Other forms of dyspnea: Secondary | ICD-10-CM | POA: Insufficient documentation

## 2012-09-28 DIAGNOSIS — I509 Heart failure, unspecified: Secondary | ICD-10-CM | POA: Insufficient documentation

## 2012-09-28 DIAGNOSIS — Z8673 Personal history of transient ischemic attack (TIA), and cerebral infarction without residual deficits: Secondary | ICD-10-CM | POA: Insufficient documentation

## 2012-09-28 DIAGNOSIS — R0989 Other specified symptoms and signs involving the circulatory and respiratory systems: Secondary | ICD-10-CM | POA: Insufficient documentation

## 2012-09-28 DIAGNOSIS — I059 Rheumatic mitral valve disease, unspecified: Secondary | ICD-10-CM

## 2012-09-28 NOTE — Progress Notes (Signed)
  Echocardiogram 2D Echocardiogram has been performed.  Dashanti Burr, [facility] 09/28/2012, 11:51 AM

## 2012-10-01 ENCOUNTER — Encounter: Payer: Self-pay | Admitting: Internal Medicine

## 2012-10-02 ENCOUNTER — Telehealth (HOSPITAL_COMMUNITY): Payer: Self-pay | Admitting: *Deleted

## 2012-10-02 NOTE — Telephone Encounter (Signed)
Received call from [name] with [facility] she states pt was c/o increased SOB when she saw him yesterday evening, she states his wt has been stable and no edema that she noted, she states in reviewing meds pt is taking them as prescribed and told her he was instructed to take extra lasix if SOB, she advised him to go ahead and take extra dose yesterday, she states his VS were stable, she felt his heart beat was slightly irregular, appt not sch until 5/7 with [name], have added pt to sch tomorrow at 1:30 to eval SOB

## 2012-10-03 ENCOUNTER — Ambulatory Visit (HOSPITAL_COMMUNITY)
Admission: RE | Admit: 2012-10-03 | Discharge: 2012-10-03 | Disposition: A | Discharge status: Home / Self Care | Payer: Medicare Other | Source: Ambulatory Visit | Attending: Internal Medicine | Admitting: Internal Medicine

## 2012-10-03 VITALS — BP 108/54 | HR 82 | Wt 145.4 lb

## 2012-10-03 DIAGNOSIS — Z8673 Personal history of transient ischemic attack (TIA), and cerebral infarction without residual deficits: Secondary | ICD-10-CM | POA: Insufficient documentation

## 2012-10-03 DIAGNOSIS — I452 Bifascicular block: Secondary | ICD-10-CM | POA: Insufficient documentation

## 2012-10-03 DIAGNOSIS — I4891 Unspecified atrial fibrillation: Secondary | ICD-10-CM

## 2012-10-03 DIAGNOSIS — I10 Essential (primary) hypertension: Secondary | ICD-10-CM | POA: Insufficient documentation

## 2012-10-03 DIAGNOSIS — Z7901 Long term (current) use of anticoagulants: Secondary | ICD-10-CM | POA: Insufficient documentation

## 2012-10-03 DIAGNOSIS — E7800 Pure hypercholesterolemia, unspecified: Secondary | ICD-10-CM | POA: Insufficient documentation

## 2012-10-03 DIAGNOSIS — I5022 Chronic systolic (congestive) heart failure: Secondary | ICD-10-CM | POA: Insufficient documentation

## 2012-10-03 DIAGNOSIS — E785 Hyperlipidemia, unspecified: Secondary | ICD-10-CM | POA: Insufficient documentation

## 2012-10-03 DIAGNOSIS — I252 Old myocardial infarction: Secondary | ICD-10-CM | POA: Insufficient documentation

## 2012-10-03 DIAGNOSIS — Z79899 Other long term (current) drug therapy: Secondary | ICD-10-CM | POA: Insufficient documentation

## 2012-10-03 DIAGNOSIS — I509 Heart failure, unspecified: Secondary | ICD-10-CM | POA: Insufficient documentation

## 2012-10-03 NOTE — Patient Instructions (Addendum)
145 libras o mas tome tres furosemide  Dr. Ladona Ridgel Friday May 23 at 2:15, at [facility] [address] 3rd floor, [phone]

## 2012-10-03 NOTE — Assessment & Plan Note (Signed)
Remains in NSR, continue coumadin.

## 2012-10-03 NOTE — Assessment & Plan Note (Signed)
Volume status appears stable today.  I believe fluid overload was in setting of increased fluid intake.  Have discussed fluid restrictions and low sodium diet.  He voices understanding.  Will continue current regimen with plans to take lasix 120 mg on days weight is 145 pounds or more.  Continue milrinone 0.375 mcg/kg/min.  With EF remaining less than 35% will refer to Dr. Ladona Ridgel for discussion on CRT-D placement.  He agrees to the plan.

## 2012-10-03 NOTE — Progress Notes (Signed)
HPI: Tyler Christensen is a 79 y.o. [name] (non-english speaking) w/ PMHx significant for Chronic Systolic CHF (EF 91%), NICM, LBBB, PAF (on coumadin), HTN, HLD, CVA, Pulmonary Hypertension, LAA clot, and Seizure (2/2 CVA 05/2012).   Admitted to [facility] 06/2012 with acute decompensated heart failure. As noted below diagnosed with severe pulmonary hypertension from initial RHC and placed on Milrinone and Revatio (eventually transitioned to tadalafil 40). VQ low probability for pulmonary embolus. Repeat RHC on 07/06/12 with markedly improved PVR and Cardiac Index noted. Discharged on Milrinone at 0.375 mcg via PICC. Anticipate possible DC-CV after 4 weeks of therapeutic anti-coagulation (07/08/12).  May also consider BiVICD in near future. He was not placed on an ACE-I at this point due to soft BP. Discharge Weight 146 pounds   07/02/12 RHC  RA = 15  RV = 94/10/13  PA = 93/42 (59)  PCW = 15  Fick cardiac output/index = 1.9/1.1  PVR = 23.0 Woods  SVR = 3140  FA sat = 95%  PA sat = 31%, 32%  Ao Pressure: 121/60 (84)  LV Pressure: 115/12/14   07/06/12 RHC  RA = 9  RV = 74/1/7  PA = 75/32 (47)  PCW = 34  Fick cardiac output/index = 3.9/2.1  PVR = 3.3 Woods  FA sat = 92%  PA sat = 56%, 56%   08/14/12 S/P successful DC-CV   He returns for work in visit today. Interpreter present.  He says Sunday and Monday were bad days, he didn\'t sleep due to SOB so increased lasix 120 mg for 2 days and improved symptoms.  He does indorse increase in fluid intake which may be cause of fluid overload.  Today weight is 143 pounds.  He currently denies orthopnea, PND or edema.  Wearing lifevest and denies alarms.  Continues to be followed by [facility] for milrinone 0.375 mcg/kg/min  Repeat echo last week showed EF 15%   NO ACE-I 2/2 rash   ROS: All systems negative except as listed in HPI, PMH and Problem List.  Past Medical History  Diagnosis Date  . HTN (hypertension)   . High cholesterol   . CHF (congestive heart  failure)   . BBBB (bilateral bundle branch block)   . LBBB (left bundle branch block)   . Weakness   . Tired   . Seizures 06/11/2012    new onset/notes (06/11/2012)  . SOB (shortness of breath)     "sometimes when I lay down;; related to not taking my RX" (06/11/2012)  . Myocardial infarction     12 /22/2013    Current Outpatient Prescriptions  Medication Sig Dispense Refill  . carvedilol (COREG) 6.25 MG tablet Take 6.25 mg by mouth daily. Take 1/2 tab in am and 1 tab in pm      . Choline Fenofibrate (TRILIPIX) 45 MG capsule Take 45 mg by mouth daily.      . digoxin (LANOXIN) 0.125 MG tablet Take 0.5 tablets (0.0625 mg total) by mouth daily.  15 tablet  3  . furosemide (LASIX) 40 MG tablet Take 2 tablets (80 mg total) by mouth daily.  30 tablet    . milrinone (PRIMACOR) 20 MG/100ML SOLN infusion Inject 25.3125 mcg/min into the vein continuous.  100 mL    . potassium chloride SA (K-DUR,KLOR-CON) 20 MEQ tablet Take 2 tablets (40 mEq total) by mouth daily.  30 tablet  6  . simvastatin (ZOCOR) 20 MG tablet Take 40 mg by mouth daily.       Marland Kitchen  spironolactone (ALDACTONE) 25 MG tablet Take 0.5 tablets (12.5 mg total) by mouth daily.  30 tablet  3  . Tadalafil, PAH, 20 MG TABS Take 2 tablets (40 mg total) by mouth daily.  60 tablet  6  . warfarin (COUMADIN) 4 MG tablet Take 1 tablet (4 mg total) by mouth daily.  35 tablet  1   No current facility-administered medications for this encounter.     PHYSICAL EXAM: Filed Vitals:   10/03/12 1335  BP: 108/54  Pulse: 82  Weight: 145 lb 6 oz (65.942 kg)  SpO2: 93%   General:  Well appearing. No resp difficulty  HEENT: normal Neck: supple. JVP 5-6 Carotids 2+ bilaterally; no bruits. No lymphadenopathy or thryomegaly appreciated. Cor: PMI normal. Irregular rate & rhythm. No rubs, gallops or murmurs. Life Vest on Lungs: clear Abdomen: soft, nontender, nondistended. No hepatosplenomegaly. No bruits or masses. Good bowel sounds. Extremities: no  cyanosis, clubbing, rash, extremity edema  RUE PICC  Neuro: alert & orientedx3, cranial nerves grossly intact. Moves all 4 extremities w/o difficulty. Affect pleasant.     ASSESSMENT & PLAN:

## 2012-10-04 NOTE — Addendum Note (Signed)
Encounter addended by: Simon Rhein, CCT on: 10/04/2012  8:01 AM<BR>     Documentation filed: Charges VN

## 2012-10-08 ENCOUNTER — Encounter: Payer: Self-pay | Admitting: Internal Medicine

## 2012-10-08 ENCOUNTER — Ambulatory Visit (INDEPENDENT_AMBULATORY_CARE_PROVIDER_SITE_OTHER): Payer: Medicare Other | Admitting: Cardiology

## 2012-10-08 DIAGNOSIS — I4891 Unspecified atrial fibrillation: Secondary | ICD-10-CM

## 2012-10-15 ENCOUNTER — Ambulatory Visit (INDEPENDENT_AMBULATORY_CARE_PROVIDER_SITE_OTHER): Payer: Medicare Other | Admitting: Cardiology

## 2012-10-15 DIAGNOSIS — I4891 Unspecified atrial fibrillation: Secondary | ICD-10-CM

## 2012-10-16 ENCOUNTER — Encounter: Payer: Self-pay | Admitting: Internal Medicine

## 2012-10-21 ENCOUNTER — Inpatient Hospital Stay (HOSPITAL_COMMUNITY)
Admission: EM | Admit: 2012-10-21 | Discharge: 2012-10-23 | DRG: 292 | Disposition: A | Discharge status: Home Health | Payer: Medicare Other | Attending: Internal Medicine | Admitting: Internal Medicine

## 2012-10-21 ENCOUNTER — Emergency Department (HOSPITAL_COMMUNITY): Payer: Medicare Other

## 2012-10-21 ENCOUNTER — Encounter (HOSPITAL_COMMUNITY): Payer: Self-pay | Admitting: Emergency Medicine

## 2012-10-21 DIAGNOSIS — Z79899 Other long term (current) drug therapy: Secondary | ICD-10-CM

## 2012-10-21 DIAGNOSIS — I252 Old myocardial infarction: Secondary | ICD-10-CM

## 2012-10-21 DIAGNOSIS — I509 Heart failure, unspecified: Secondary | ICD-10-CM | POA: Diagnosis present

## 2012-10-21 DIAGNOSIS — I5023 Acute on chronic systolic (congestive) heart failure: Principal | ICD-10-CM | POA: Diagnosis present

## 2012-10-21 DIAGNOSIS — Z7901 Long term (current) use of anticoagulants: Secondary | ICD-10-CM

## 2012-10-21 DIAGNOSIS — I428 Other cardiomyopathies: Secondary | ICD-10-CM | POA: Diagnosis present

## 2012-10-21 DIAGNOSIS — I2789 Other specified pulmonary heart diseases: Secondary | ICD-10-CM | POA: Diagnosis present

## 2012-10-21 DIAGNOSIS — I129 Hypertensive chronic kidney disease with stage 1 through stage 4 chronic kidney disease, or unspecified chronic kidney disease: Secondary | ICD-10-CM | POA: Diagnosis present

## 2012-10-21 DIAGNOSIS — N189 Chronic kidney disease, unspecified: Secondary | ICD-10-CM

## 2012-10-21 DIAGNOSIS — Z8673 Personal history of transient ischemic attack (TIA), and cerebral infarction without residual deficits: Secondary | ICD-10-CM

## 2012-10-21 DIAGNOSIS — G40909 Epilepsy, unspecified, not intractable, without status epilepticus: Secondary | ICD-10-CM | POA: Diagnosis present

## 2012-10-21 DIAGNOSIS — E7800 Pure hypercholesterolemia, unspecified: Secondary | ICD-10-CM | POA: Diagnosis present

## 2012-10-21 DIAGNOSIS — R791 Abnormal coagulation profile: Secondary | ICD-10-CM

## 2012-10-21 DIAGNOSIS — Z888 Allergy status to other drugs, medicaments and biological substances status: Secondary | ICD-10-CM

## 2012-10-21 DIAGNOSIS — I5022 Chronic systolic (congestive) heart failure: Secondary | ICD-10-CM | POA: Diagnosis present

## 2012-10-21 DIAGNOSIS — I447 Left bundle-branch block, unspecified: Secondary | ICD-10-CM | POA: Diagnosis present

## 2012-10-21 DIAGNOSIS — I4891 Unspecified atrial fibrillation: Secondary | ICD-10-CM

## 2012-10-21 DIAGNOSIS — I8390 Asymptomatic varicose veins of unspecified lower extremity: Secondary | ICD-10-CM | POA: Diagnosis present

## 2012-10-21 DIAGNOSIS — N183 Chronic kidney disease, stage 3 unspecified: Secondary | ICD-10-CM | POA: Diagnosis present

## 2012-10-21 LAB — POCT I-STAT, CHEM 8
BUN: 25 mg/dL — ABNORMAL HIGH (ref 6–23)
Calcium, Ion: 1.14 mmol/L (ref 1.13–1.30)
Chloride: 101 mEq/L (ref 96–112)
Creatinine, Ser: 1.4 mg/dL — ABNORMAL HIGH (ref 0.50–1.35)
Glucose, Bld: 137 mg/dL — ABNORMAL HIGH (ref 70–99)
TCO2: 27 mmol/L (ref 0–100)

## 2012-10-21 LAB — CBC WITH DIFFERENTIAL/PLATELET
Basophils Relative: 1 % (ref 0–1)
Eosinophils Absolute: 0.3 10*3/uL (ref 0.0–0.7)
HCT: 40.7 % (ref 39.0–52.0)
Hemoglobin: 14.3 g/dL (ref 13.0–17.0)
Lymphs Abs: 1.2 10*3/uL (ref 0.7–4.0)
MCH: 31.9 pg (ref 26.0–34.0)
MCHC: 35.1 g/dL (ref 30.0–36.0)
Monocytes Absolute: 0.5 10*3/uL (ref 0.1–1.0)
Monocytes Relative: 6 % (ref 3–12)
Neutro Abs: 6.2 10*3/uL (ref 1.7–7.7)
RBC: 4.48 MIL/uL (ref 4.22–5.81)

## 2012-10-21 LAB — COMPREHENSIVE METABOLIC PANEL
ALT: 5 U/L (ref 0–53)
AST: 20 U/L (ref 0–37)
Albumin: 4.1 g/dL (ref 3.5–5.2)
Alkaline Phosphatase: 73 U/L (ref 39–117)
Calcium: 9.5 mg/dL (ref 8.4–10.5)
GFR calc Af Amer: 50 mL/min — ABNORMAL LOW (ref >90)
Glucose, Bld: 147 mg/dL — ABNORMAL HIGH (ref 70–99)
Potassium: 4.1 mEq/L (ref 3.5–5.1)
Sodium: 139 mEq/L (ref 135–145)
Total Protein: 7.7 g/dL (ref 6.0–8.3)

## 2012-10-21 LAB — POCT I-STAT TROPONIN I: Troponin i, poc: 0.02 ng/mL (ref 0.00–0.08)

## 2012-10-21 LAB — PROTIME-INR
INR: 1.42 (ref 0.00–1.49)
Prothrombin Time: 17 seconds — ABNORMAL HIGH (ref 11.6–15.2)

## 2012-10-21 LAB — DIGOXIN LEVEL: Digoxin Level: 0.7 ng/mL — ABNORMAL LOW (ref 0.8–2.0)

## 2012-10-21 MED ORDER — SPIRONOLACTONE 12.5 MG HALF TABLET
12.5000 mg | ORAL_TABLET | Freq: Every day | ORAL | Status: DC
  Administered 2012-10-21 – 2012-10-23 (×3): 12.5 mg via ORAL
  Filled 2012-10-21 (×3): qty 1

## 2012-10-21 MED ORDER — TADALAFIL (PAH) 20 MG PO TABS
40.0000 mg | ORAL_TABLET | Freq: Every day | ORAL | Status: DC
  Administered 2012-10-21 – 2012-10-23 (×3): 40 mg via ORAL
  Filled 2012-10-21 (×3): qty 2

## 2012-10-21 MED ORDER — ONDANSETRON HCL 4 MG/2ML IJ SOLN: 4.0000 mg | Freq: Four times a day (QID) | INTRAMUSCULAR | Status: DC | PRN

## 2012-10-21 MED ORDER — SODIUM CHLORIDE 0.9 % IV SOLN
250.0000 mL | INTRAVENOUS | Status: DC | PRN
  Administered 2012-10-22: 500 mL via INTRAVENOUS

## 2012-10-21 MED ORDER — FENOFIBRATE 54 MG PO TABS
54.0000 mg | ORAL_TABLET | Freq: Every day | ORAL | Status: DC
  Administered 2012-10-21 – 2012-10-22 (×2): 54 mg via ORAL
  Filled 2012-10-21 (×3): qty 1

## 2012-10-21 MED ORDER — CARVEDILOL 6.25 MG PO TABS
6.2500 mg | ORAL_TABLET | Freq: Two times a day (BID) | ORAL | Status: DC
  Administered 2012-10-21 – 2012-10-23 (×4): 6.25 mg via ORAL
  Filled 2012-10-21 (×6): qty 1

## 2012-10-21 MED ORDER — POTASSIUM CHLORIDE CRYS ER 20 MEQ PO TBCR
20.0000 meq | EXTENDED_RELEASE_TABLET | Freq: Every day | ORAL | Status: DC
  Administered 2012-10-21 – 2012-10-23 (×3): 20 meq via ORAL
  Filled 2012-10-21 (×3): qty 1

## 2012-10-21 MED ORDER — ACETAMINOPHEN 325 MG PO TABS: 650.0000 mg | ORAL_TABLET | ORAL | Status: DC | PRN

## 2012-10-21 MED ORDER — FUROSEMIDE 10 MG/ML IJ SOLN
40.0000 mg | Freq: Once | INTRAMUSCULAR | Status: AC
  Administered 2012-10-21: 40 mg via INTRAVENOUS
  Filled 2012-10-21: qty 4

## 2012-10-21 MED ORDER — SODIUM CHLORIDE 0.9 % IJ SOLN: 3.0000 mL | INTRAMUSCULAR | Status: DC | PRN

## 2012-10-21 MED ORDER — WARFARIN SODIUM 4 MG PO TABS: 4.0000 mg | ORAL_TABLET | Freq: Every day | ORAL | Status: DC

## 2012-10-21 MED ORDER — WARFARIN - PHARMACIST DOSING INPATIENT: Freq: Every day | Status: DC

## 2012-10-21 MED ORDER — WARFARIN SODIUM 7.5 MG PO TABS
7.5000 mg | ORAL_TABLET | ORAL | Status: AC
  Administered 2012-10-21: 7.5 mg via ORAL
  Filled 2012-10-21: qty 1

## 2012-10-21 MED ORDER — MILRINONE IN DEXTROSE 20 MG/100ML IV SOLN
0.3750 ug/kg/min | INTRAVENOUS | Status: DC
  Administered 2012-10-21 – 2012-10-23 (×4): 0.375 ug/kg/min via INTRAVENOUS
  Filled 2012-10-21 (×4): qty 100

## 2012-10-21 MED ORDER — SODIUM CHLORIDE 0.9 % IJ SOLN
3.0000 mL | Freq: Two times a day (BID) | INTRAMUSCULAR | Status: DC
  Administered 2012-10-23: 3 mL via INTRAVENOUS

## 2012-10-21 MED ORDER — FUROSEMIDE 10 MG/ML IJ SOLN
80.0000 mg | Freq: Three times a day (TID) | INTRAMUSCULAR | Status: DC
  Administered 2012-10-21 – 2012-10-22 (×2): 80 mg via INTRAVENOUS
  Filled 2012-10-21 (×5): qty 8

## 2012-10-21 MED ORDER — SIMVASTATIN 40 MG PO TABS
40.0000 mg | ORAL_TABLET | Freq: Every day | ORAL | Status: DC
  Administered 2012-10-21 – 2012-10-22 (×2): 40 mg via ORAL
  Filled 2012-10-21 (×3): qty 1

## 2012-10-21 MED ORDER — SODIUM CHLORIDE 0.9 % IJ SOLN
10.0000 mL | INTRAMUSCULAR | Status: DC | PRN
  Administered 2012-10-22: 10 mL

## 2012-10-21 MED ORDER — DIGOXIN 0.0625 MG HALF TABLET
0.0625 mg | ORAL_TABLET | Freq: Every day | ORAL | Status: DC
  Administered 2012-10-21 – 2012-10-23 (×3): 0.0625 mg via ORAL
  Filled 2012-10-21 (×3): qty 1

## 2012-10-21 NOTE — ED Notes (Addendum)
Tilley MD at bedside. 

## 2012-10-21 NOTE — ED Provider Notes (Signed)
Medical screening examination/treatment/procedure(s) were performed by non-physician practitioner and as supervising physician I was immediately available for consultation/collaboration.   Samentha Perham, MD 10/21/12 2315 

## 2012-10-21 NOTE — ED Provider Notes (Signed)
Date: 10/21/2012  Rate: 104  Rhythm: normal sinus rhythm  QRS Axis: left  Intervals: normal  ST/T Wave abnormalities: nonspecific ST/T changes  Conduction Disutrbances:left bundle branch block  Narrative Interpretation:   Old EKG Reviewed: unchanged  I saw and evaluated the patient, reviewed the resident's note and I agree with the findings and plan.  Pt with end stage CHF, external defib in place, on milrinone drip, presents with increased SOB, orthopnea, hypoxic on arrival.  Doing well on O2 via n/c.  Will check labs, x-ray, consult cards for admission.    Rolan Bucco, MD 10/21/12 [phone]

## 2012-10-21 NOTE — ED Provider Notes (Signed)
History     CSN: [id number]  Arrival date & time 10/21/12  1457   First MD Initiated Contact with Patient 10/21/12 1502      No chief complaint on file.   (Consider location/radiation/quality/duration/timing/severity/associated sxs/prior treatment) Patient is a 79 y.o. male presenting with shortness of breath. The history is provided by the patient and medical records. The history is limited by a language barrier. A language interpreter was used (Spanish via phone).  Shortness of Breath Severity:  Moderate Onset quality:  Gradual Duration:  4 days Timing:  Constant Progression:  Worsening Chronicity:  Recurrent Context comment:  History of pulmonary hypertension and CHF (EF 15% on recent echo); on Milrinone via PICC line and with external defibrillator Ineffective treatments:  Diuretics (Has been taking Lasix (40 mg PO daily)) Associated symptoms: cough   Associated symptoms: no abdominal pain, no chest pain, no diaphoresis, no fever, no neck pain, no rash, no vomiting and no wheezing     Past Medical History  Diagnosis Date  . HTN (hypertension)   . High cholesterol   . CHF (congestive heart failure)   . BBBB (bilateral bundle branch block)   . LBBB (left bundle branch block)   . Weakness   . Tired   . Seizures 06/11/2012    new onset/notes (06/11/2012)  . SOB (shortness of breath)     "sometimes when I lay down;; related to not taking my RX" (06/11/2012)  . Myocardial infarction     06/10/2012    Past Surgical History  Procedure Laterality Date  . Throat surgery  1942    "and nose" (06/11/2012); ?T&A  . Inguinal hernia repair  ~ 2008    "both sides" (06/11/2012)  . Tee without cardioversion  06/29/2012    Procedure: TRANSESOPHAGEAL ECHOCARDIOGRAM (TEE);  Surgeon: Dolores Patty, MD;  Location: [facility] ENDOSCOPY;  Service: Cardiovascular;  Laterality: N/A;  will need a spanish interpreter Interpreter will be here at 1330-Hope spoke w/ [place]  . Cardioversion N/A  08/14/2012    Procedure: CARDIOVERSION;  Surgeon: Dolores Patty, MD;  Location: [facility] ENDOSCOPY;  Service: Cardiovascular;  Laterality: N/A;    No family history on file.  History  Substance Use Topics  . Smoking status: Never Smoker   . Smokeless tobacco: Never Used  . Alcohol Use: No      Review of Systems  Constitutional: Negative for fever, chills and diaphoresis.  HENT: Negative for neck pain.   Respiratory: Positive for cough and shortness of breath. Negative for chest tightness and wheezing.   Cardiovascular: Negative for chest pain, palpitations and leg swelling.  Gastrointestinal: Negative for nausea, vomiting, abdominal pain, diarrhea and constipation.  Genitourinary: Positive for decreased urine volume.  Skin: Negative for rash and wound.  Neurological: Negative for weakness and light-headedness.  All other systems reviewed and are negative.    Allergies  Lisinopril  Home Medications   Current Outpatient Rx  Name  Route  Sig  Dispense  Refill  . carvedilol (COREG) 6.25 MG tablet   Oral   Take 6.25 mg by mouth 2 (two) times daily with a meal.          . Choline Fenofibrate (TRILIPIX) 45 MG capsule   Oral   Take 45 mg by mouth daily.         . digoxin (LANOXIN) 0.125 MG tablet   Oral   Take 0.5 tablets (0.0625 mg total) by mouth daily.   15 tablet   3   . furosemide (  LASIX) 40 MG tablet   Oral   Take 2 tablets (80 mg total) by mouth daily.   30 tablet      . milrinone (PRIMACOR) 20 MG/100ML SOLN infusion   Intravenous   Inject 25.3125 mcg/min into the vein continuous.   100 mL      . potassium chloride SA (K-DUR,KLOR-CON) 20 MEQ tablet   Oral   Take 20 mEq by mouth daily.         . simvastatin (ZOCOR) 20 MG tablet   Oral   Take 40 mg by mouth daily.          Marland Kitchen spironolactone (ALDACTONE) 25 MG tablet   Oral   Take 0.5 tablets (12.5 mg total) by mouth daily.   30 tablet   3   . Tadalafil, PAH, 20 MG TABS   Oral   Take 2  tablets (40 mg total) by mouth daily.   60 tablet   6   . warfarin (COUMADIN) 4 MG tablet   Oral   Take 1 tablet (4 mg total) by mouth daily.   35 tablet   1     There were no vitals taken for this visit.  Physical Exam  Nursing note and vitals reviewed. Constitutional: He appears well-developed and well-nourished.  HENT:  Head: Normocephalic and atraumatic.  Right Ear: External ear normal.  Left Ear: External ear normal.  Nose: Nose normal.  Mouth/Throat: Oropharynx is clear and moist. No oropharyngeal exudate.  Eyes: Conjunctivae are normal. Pupils are equal, round, and reactive to light.  Neck: Normal range of motion. Neck supple.  Cardiovascular: Normal rate, regular rhythm, normal heart sounds and intact distal pulses.   Pulmonary/Chest: Effort normal. No respiratory distress. He has no wheezes. He has rales (bibasilar). He exhibits no tenderness.  Abdominal: Soft. Bowel sounds are normal. He exhibits no distension and no mass. There is no tenderness. There is no rebound and no guarding.  Musculoskeletal: Normal range of motion. He exhibits no edema and no tenderness.  Right arm PICC line in place infusing milrinone   Neurological: He is alert. He displays normal reflexes. No cranial nerve deficit. He exhibits normal muscle tone. Coordination normal.  Skin: Skin is warm and dry. No rash noted. No erythema. No pallor.  Psychiatric: He has a normal mood and affect. His behavior is normal. Judgment and thought content normal.    ED Course  Procedures (including critical care time)  Labs Reviewed  PRO B NATRIURETIC PEPTIDE - Abnormal; Notable for the following:    Pro B Natriuretic peptide (BNP) 8696.0 (*)    All other components within normal limits  PROTIME-INR - Abnormal; Notable for the following:    Prothrombin Time 17.0 (*)    All other components within normal limits  DIGOXIN LEVEL - Abnormal; Notable for the following:    Digoxin Level 0.7 (*)    All other  components within normal limits  COMPREHENSIVE METABOLIC PANEL - Abnormal; Notable for the following:    Glucose, Bld 147 (*)    BUN 25 (*)    Creatinine, Ser 1.50 (*)    GFR calc non Af Amer 43 (*)    GFR calc Af Amer 50 (*)    All other components within normal limits  POCT I-STAT, CHEM 8 - Abnormal; Notable for the following:    BUN 25 (*)    Creatinine, Ser 1.40 (*)    Glucose, Bld 137 (*)    All other components within normal limits  MRSA PCR SCREENING  CBC WITH DIFFERENTIAL  APTT  POCT I-STAT TROPONIN I   Dg Chest 2 View  10/21/2012  *RADIOLOGY REPORT*  Clinical Data: Chest pain.  CHEST - 2 VIEW  Comparison: July 03, 2012.  Findings: Stable mild cardiomegaly.  Right-sided PICC line is noted.  Mild central pulmonary vascular congestion is noted.  No pneumothorax is noted  IMPRESSION: Mild central pulmonary vascular congestion with associated minimal pleural effusions.   Original Report Authenticated By: Lupita Raider.,  M.D.      Date: 10/21/2012  Rate: 83 bpm   Rhythm: normal sinus rhythm with premature ventricular contractions  QRS Axis: left  Intervals: PR prolonged  ST/T Wave abnormalities: nonspecific ST changes  Conduction Disutrbances:first-degree A-V block  and left bundle branch block  Narrative Interpretation: Normal sinus rhythm with 1st degree AV block, left bundle branch block and occasional PVC's, unchanged from previous; no evidence of acute ischemia  Old EKG Reviewed: unchanged   1. Acute on chronic systolic heart failure   2. Subtherapeutic international normalized ratio (INR)   3. Chronic renal insufficiency       MDM  79 yo M w/hx of chronic systolic CHF (EF 16% on echo last month) with external defibrillator and milrinone via PICC line, paroxysmal atrial fibrillation (on coumadin), HTN, HLD, CVA, pulmonary hypertension, and Seizure (secondary to CVA 05/2012) presents for worsening dyspnea of 4 days despite increase in his prescribed PO Lasix. Pt  hypoxic in triage to 89% on room air. Bibasilar rales on exam. Pt states his weight has not changed recently and denies chest pain, palpitations, nausea, or diaphoresis. Dr. Clarise Cruz is his Cardiologist.   Clinical picture, including CXR and BNP, support fluid overload with CHF exacerbation. IV Lasix administered with 1L of diuresis. However, pt still complains of dyspnea. Serial troponins negative. Cardiology consulted for admission.          Clemetine Marker, MD 10/21/12 2152

## 2012-10-21 NOTE — Progress Notes (Signed)
ANTICOAGULATION CONSULT NOTE - Initial Consult  Pharmacy Consult for Coumadin and Milrinone Infusion Indication: atrial fibrillation + CHF  Allergies  Allergen Reactions  . Lisinopril Rash    Patient Measurements: Height: 5\' 6"  (167.6 cm) Weight: 145 lb (65.772 kg) IBW/kg (Calculated) : 63.8 Heparin Dosing Weight:   Vital Signs: Temp: 98.9 F (37.2 C) (05/04 1510) Temp src: Oral (05/04 1510) BP: 109/70 mmHg (05/04 2000) Pulse Rate: 78 (05/04 2000)  Labs:  Recent Labs  10/21/12 1521 10/21/12 1546  HGB 14.3 15.3  HCT 40.7 45.0  PLT 283  --   APTT 35  --   LABPROT 17.0*  --   INR 1.42  --   CREATININE 1.50* 1.40*    Estimated Creatinine Clearance: 39.9 ml/min (by C-G formula based on Cr of 1.4).   Medical History: Past Medical History  Diagnosis Date  . HTN (hypertension)   . High cholesterol   . CHF (congestive heart failure)   . BBBB (bilateral bundle branch block)   . LBBB (left bundle branch block)   . Weakness   . Tired   . Seizures 06/11/2012    new onset/notes (06/11/2012)  . SOB (shortness of breath)     "sometimes when I lay down;; related to not taking my RX" (06/11/2012)  . Myocardial infarction     06/10/2012    Medications: see med rec  Assessment: CP and SOB x 3 days. Pt only speaks [place].  PMH: HTN, HLD, CHF, Bil. Bundle branch block, LBBB, seizures, CAD  Anticoag: Coumadin PTA for afib. Baseline CBC WNL. INR only 1.42 < goal.  Cards: CHF on home milrinone. proBNP [id number]. Digoxin level 0.7. Home dose listed as 50mcg/min.  Renal: Scr 1.4 with estimated CrCl 40  Goal of Therapy:  INR 2-3 Monitor platelets by anticoagulation protocol: Yes   Plan:  Coumadin 7.5mg  po x 1 tonight Daily PT/INR Continue milrinone at 0.375 mcg/kg/min=67mcg/min    Tyler Christensen, PharmD, BCPS Clinical Staff Pharmacist Pager [phone]  Tyler Christensen 10/21/2012,8:16 PM

## 2012-10-21 NOTE — ED Notes (Signed)
Spanish Int called for Dr. Donnie Aho

## 2012-10-21 NOTE — H&P (Addendum)
History and Physical   Admit date: 10/21/2012 Name:  Tyler Christensen Medical record number: [id number] DOB/Age:  June 19, 1933  79 y.o. male  Referring Physician:   [facility]  Primary Cardiologist:  Dr. Gala Romney  Chief complaint/reason for admission: Shortness of breath  HPI:  This 79 year old [name] has a nonischemic cardiomyopathy. He was admitted in December with a seizure and found to have a right brain stroke and has been seen by Dr. Gala Romney in the advanced heart failure clinic. He was admitted in January with decompensated heart failure with a poor response to intravenous Lasix. He was in atrial fibrillation and had a TEE that showed a left atrial clot. Dr. Ladona Ridgel did not feel he was ready for a biventricular ICD. He was placed on milrinone and had a right heart cath done that showed severe pulmonary hypertension. He was placed on milrinone and transitioned to tadalafil. He was discharged on a life vest and was followed in the heart failure clinic.  After he was anticoagulated for a month he underwent cardioversion on February 25 and had several beats of sinus rhythm noted following cardioversion and had either atrial flutter or atrial tachycardia noted   He presented to the emergency room with a four-day history of worsening dyspnea. All of the history is obtained through an interpreter as he does not speak [place]. He denied sodium ingestion but did note that he had been drinking a lot of cold water at home. He has been taking his furosemide but had a drop in his urine output. He has had some PND and some mild orthopnea. He says that his weight has not changed much. He had some mild chest tightness earlier.   Past Medical History  Diagnosis Date  . HTN (hypertension)   . High cholesterol   . CHF (congestive heart failure)   . BBBB (bilateral bundle branch block)   . LBBB (left bundle branch block)   . Weakness   . Tired   . Seizures 06/11/2012    new onset/notes  (06/11/2012)  . SOB (shortness of breath)     "sometimes when I lay down;; related to not taking my RX" (06/11/2012)  . Myocardial infarction     06/10/2012      Past Surgical History  Procedure Laterality Date  . Throat surgery  1942    "and nose" (06/11/2012); ?T&A  . Inguinal hernia repair  ~ 2008    "both sides" (06/11/2012)  . Tee without cardioversion  06/29/2012    Procedure: TRANSESOPHAGEAL ECHOCARDIOGRAM (TEE);  Surgeon: Dolores Patty, MD;  Location: [facility] ENDOSCOPY;  Service: Cardiovascular;  Laterality: N/A;  will need a spanish interpreter Interpreter will be here at 1330-Hope spoke w/ [place]  . Cardioversion N/A 08/14/2012    Procedure: CARDIOVERSION;  Surgeon: Dolores Patty, MD;  Location: [facility] ENDOSCOPY;  Service: Cardiovascular;  Laterality: N/A;  .  Allergies: is allergic to lisinopril.   Medications: Prior to Admission medications   Medication Sig Start Date End Date Taking? Authorizing Provider  carvedilol (COREG) 6.25 MG tablet Take 6.25 mg by mouth 2 (two) times daily with a meal.  07/26/12   [name] D Clegg, NP  Choline Fenofibrate (TRILIPIX) 45 MG capsule Take 45 mg by mouth daily.    Historical Provider, MD  digoxin (LANOXIN) 0.125 MG tablet Take 0.5 tablets (0.0625 mg total) by mouth daily. 08/16/12   Dolores Patty, MD  furosemide (LASIX) 40 MG tablet Take 2 tablets (80 mg total) by mouth daily.  08/28/12   Hadassah Pais, PA-C  milrinone (PRIMACOR) 20 MG/100ML SOLN infusion Inject 25.3125 mcg/min into the vein continuous. 07/10/12   Hadassah Pais, PA-C  potassium chloride SA (K-DUR,KLOR-CON) 20 MEQ tablet Take 20 mEq by mouth daily. 08/28/12   Hadassah Pais, PA-C  simvastatin (ZOCOR) 20 MG tablet Take 40 mg by mouth daily.     Historical Provider, MD  spironolactone (ALDACTONE) 25 MG tablet Take 0.5 tablets (12.5 mg total) by mouth daily. 09/21/12   Hadassah Pais, PA-C  Tadalafil, PAH, 20 MG TABS Take 2 tablets (40 mg total) by mouth daily. 07/10/12   Hadassah Pais, PA-C  warfarin (COUMADIN) 4 MG tablet Take 1 tablet (4 mg total) by mouth daily. 07/24/12   Dolores Patty, MD    Family History:  No family status information on file.    Social History:   reports that he has never smoked. He has never used smokeless tobacco. He reports that he does not drink alcohol or use illicit drugs.   History   Social History Narrative  . No narrative on file     Review of Systems: He says that he has no stomach trouble. He has no arthritis. He has had reduced urine output over the past day. He wears glasses. He has had no recurrent seizures. Other than as noted above, the remainder of the review of systems is normal  Physical Exam: BP 109/70  Pulse 78  Temp(Src) 98.9 F (37.2 C) (Oral)  Resp 20  Ht 5\' 6"  (1.676 m)  Wt 65.772 kg (145 lb)  BMI 23.41 kg/m2  SpO2 97% General appearance: alert, cooperative, appears stated age and no distress Head: Normocephalic, without obvious abnormality, atraumatic Eyes: conjunctivae/corneas clear. PERRL, EOM's intact. Fundi benign. Neck: no adenopathy, no carotid bruit and supple, symmetrical, trachea midline, neck veins appear to be slightly elevated Lungs: Mild rales at the basis Heart: prominent apical impulse and Normal S1-S2, soft S3, 1-2/6 murmur Abdomen: soft, non-tender; bowel sounds normal; no masses,  no organomegaly Rectal: deferred Extremities: Bilateral varicose veins are noted with 1+ edema Pulses: 2+ and symmetric Skin: Skin color, texture, turgor normal. No rashes or lesions Neurologic: Grossly normal   Labs: CBC  Recent Labs  10/21/12 1521 10/21/12 1546  WBC 8.2  --   RBC 4.48  --   HGB 14.3 15.3  HCT 40.7 45.0  PLT 283  --   MCV 90.8  --   MCH 31.9  --   MCHC 35.1  --   RDW 15.0  --   LYMPHSABS 1.2  --   MONOABS 0.5  --   EOSABS 0.3  --   BASOSABS 0.1  --    CMP   Recent Labs  10/21/12 1521 10/21/12 1546  NA 139 141  K 4.1 3.7  CL 100 101  CO2 28  --    GLUCOSE 147* 137*  BUN 25* 25*  CREATININE 1.50* 1.40*  CALCIUM 9.5  --   PROT 7.7  --   ALBUMIN 4.1  --   AST 20  --   ALT 5  --   ALKPHOS 73  --   BILITOT 0.6  --   GFRNONAA 43*  --   GFRAA 50*  --    BNP (last 3 results)  Recent Labs  06/11/12 0401 06/22/12 1434 10/21/12 1521  PROBNP 15163.0* 32268.0* 8696.0*   EKG: Left atrial enlargement, first degree AV block, left bundle branch block  Radiology: Cardiomegaly,  vascular congestion with mild effusions   IMPRESSIONS: 1. Acute on chronic systolic heart failure 2. Left bundle branch block 3. Stage III chronic kidney disease 4. History of atrial fibrillation 5. History of left atrial thrombus with multiple embolic strokes 6. Chronic long-term anticoagulation with warfarin 7. Nonischemic cardiomyopathy 8. Hypertension  PLAN: Patient will be admitted for intravenous diuresis. Because of his left bundle branch block and maximum medical therapy may need to be considered for biventricular pacemaking or defibrillator.  Signed: Darden Palmer MD [facility] Cardiology  10/21/2012, 8:13 PM

## 2012-10-21 NOTE — ED Notes (Signed)
Pt presents to ED with c/o chest pain today and shortness of breath for 3 days.  Pt has external defibrillator and milrinone via PICC line.  sats 88% on room air patient placed on 2 L [place] sats up to 100%. Interpreter line used due to patient speaks spanish and does not understand english.

## 2012-10-22 DIAGNOSIS — I5023 Acute on chronic systolic (congestive) heart failure: Principal | ICD-10-CM

## 2012-10-22 DIAGNOSIS — I447 Left bundle-branch block, unspecified: Secondary | ICD-10-CM

## 2012-10-22 LAB — PROTIME-INR: Prothrombin Time: 17.9 seconds — ABNORMAL HIGH (ref 11.6–15.2)

## 2012-10-22 LAB — CARBOXYHEMOGLOBIN
Carboxyhemoglobin: 1.3 % (ref 0.5–1.5)
O2 Saturation: 53.3 %
Total hemoglobin: 13.3 g/dL — ABNORMAL LOW (ref 13.5–18.0)

## 2012-10-22 LAB — BASIC METABOLIC PANEL
CO2: 30 mEq/L (ref 19–32)
Calcium: 8.9 mg/dL (ref 8.4–10.5)
Chloride: 100 mEq/L (ref 96–112)
Creatinine, Ser: 1.39 mg/dL — ABNORMAL HIGH (ref 0.50–1.35)
GFR calc Af Amer: 55 mL/min — ABNORMAL LOW (ref >90)
GFR calc non Af Amer: 47 mL/min — ABNORMAL LOW (ref >90)
Glucose, Bld: 108 mg/dL — ABNORMAL HIGH (ref 70–99)
Potassium: 3.5 mEq/L (ref 3.5–5.1)
Sodium: 140 mEq/L (ref 135–145)

## 2012-10-22 MED ORDER — BIOTENE DRY MOUTH MT LIQD
15.0000 mL | Freq: Two times a day (BID) | OROMUCOSAL | Status: DC
  Administered 2012-10-22 – 2012-10-23 (×3): 15 mL via OROMUCOSAL

## 2012-10-22 MED ORDER — POTASSIUM CHLORIDE CRYS ER 20 MEQ PO TBCR
40.0000 meq | EXTENDED_RELEASE_TABLET | Freq: Once | ORAL | Status: AC
  Administered 2012-10-22: 40 meq via ORAL
  Filled 2012-10-22: qty 2

## 2012-10-22 MED ORDER — FUROSEMIDE 80 MG PO TABS
80.0000 mg | ORAL_TABLET | Freq: Every day | ORAL | Status: DC
Start: 2012-10-23 — End: 2012-10-23
  Filled 2012-10-22: qty 1

## 2012-10-22 MED ORDER — WARFARIN SODIUM 7.5 MG PO TABS
7.5000 mg | ORAL_TABLET | Freq: Once | ORAL | Status: AC
  Administered 2012-10-22: 7.5 mg via ORAL
  Filled 2012-10-22: qty 1

## 2012-10-22 NOTE — Progress Notes (Signed)
[facility]  Patient Status: Pt active with [facility] up to this admission  [facility] is providing the following services: Pt was receiving HH nursing and [facility].   [facility] hospital team will follow and prepare to support d/c home.  If patient discharges after hours, please call [phone].   Tyler Christensen 10/22/2012, 5:24 PM

## 2012-10-22 NOTE — Progress Notes (Signed)
Advanced Heart Failure Rounding Note   Subjective:    79 y.o. [name] w/ PMHx significant for Chronic Systolic CHF (EF 40%), NICM (normal coronary anatomy 2005), LBBB, PAF (on coumadin),PAH-on adcirca,  HTN, HLD, CVA, and Seizure (2/2 CVA 05/2012). In January he was in Afib and underwent TEE that showed left atrial clot and he was placed on coumadin for a month followed by  successful DC-CV 08/14/12.  He is on chronic home Milrinone 0.375 mcg via PICC. He has been wearing lifevest at home in anticipation of BIVI.   07/06/12 RHC  RA = 9  RV = 74/1/7  PA = 75/32 (47)  PCW = 34  Fick cardiac output/index = 3.9/2.1  PVR = 3.3 [place]  FA sat = 92%  PA sat = 56%, 56%    Yesterday he was admitted with increased dyspnea. CXR with mild pulmonary edema. Admit Pro BNP 8696 and Creatinine 1.5. Started on IV lasix. Overnight weight down 5 pounds as compared to ER scale. Feels much better. Denies SOB/PND/Orthopnea. Renal function stable. Co-ox pending. INR low at 1.5.    Objective:   Weight Range:  Vital Signs:   Temp:  [97.6 F (36.4 C)-98.9 F (37.2 C)] 97.6 F (36.4 C) (05/05 0318) Pulse Rate:  [35-109] 65 (05/05 0600) Resp:  [15-29] 19 (05/05 0600) BP: (87-133)/(33-86) 127/63 mmHg (05/05 0600) SpO2:  [92 %-100 %] 100 % (05/05 0600) Weight:  [63.8 kg (140 lb 10.5 oz)-65.772 kg (145 lb)] 63.8 kg (140 lb 10.5 oz) (05/05 0500) Last BM Date: 10/20/12  Weight change: Filed Weights   10/21/12 2008 10/21/12 2134 10/22/12 0500  Weight: 64.4 kg (141 lb 15.6 oz) 64.4 kg (141 lb 15.6 oz) 63.8 kg (140 lb 10.5 oz)    Intake/Output:   Intake/Output Summary (Last 24 hours) at 10/22/12 0812 Last data filed at 10/22/12 0600  Gross per 24 hour  Intake 512.44 ml  Output   2100 ml  Net -1587.56 ml     Physical Exam: General:  Well appearing. No resp difficulty HEENT: normal Neck: supple. JVP 5-6 . Carotids 2+ bilat; no bruits. No lymphadenopathy or thryomegaly appreciated. Cor: PMI  nondisplaced. Regular rate & rhythm. No rubs, gallops or murmurs. Lungs: clear Abdomen: soft, nontender, nondistended. No hepatosplenomegaly. No bruits or masses. Good bowel sounds. Extremities: no cyanosis, clubbing, rash, edema RUE PICC  Neuro: alert & orientedx3, cranial nerves grossly intact. moves all 4 extremities w/o difficulty. Affect pleasant  Telemetry: SR 75  Labs: Basic Metabolic Panel:  Recent Labs Lab 10/21/12 1521 10/21/12 1546 10/22/12 0512  NA 139 141 140  K 4.1 3.7 3.5  CL 100 101 100  CO2 28  --  30  GLUCOSE 147* 137* 108*  BUN 25* 25* 27*  CREATININE 1.50* 1.40* 1.39*  CALCIUM 9.5  --  8.9    Liver Function Tests:  Recent Labs Lab 10/21/12 1521  AST 20  ALT 5  ALKPHOS 73  BILITOT 0.6  PROT 7.7  ALBUMIN 4.1   No results found for this basename: LIPASE, AMYLASE,  in the last 168 hours No results found for this basename: AMMONIA,  in the last 168 hours  CBC:  Recent Labs Lab 10/21/12 1521 10/21/12 1546  WBC 8.2  --   NEUTROABS 6.2  --   HGB 14.3 15.3  HCT 40.7 45.0  MCV 90.8  --   PLT 283  --     Cardiac Enzymes: No results found for this basename: CKTOTAL, CKMB, CKMBINDEX, TROPONINI,  in the last 168 hours  BNP: BNP (last 3 results)  Recent Labs  06/11/12 0401 06/22/12 1434 10/21/12 1521  PROBNP 15163.0* 32268.0* 8696.0*     Other results:    Imaging: Dg Chest 2 View  10/21/2012  *RADIOLOGY REPORT*  Clinical Data: Chest pain.  CHEST - 2 VIEW  Comparison: July 03, 2012.  Findings: Stable mild cardiomegaly.  Right-sided PICC line is noted.  Mild central pulmonary vascular congestion is noted.  No pneumothorax is noted  IMPRESSION: Mild central pulmonary vascular congestion with associated minimal pleural effusions.   Original Report Authenticated By: Lupita Raider.,  M.D.      Medications:     Scheduled Medications: . antiseptic oral rinse  15 mL Mouth Rinse BID  . carvedilol  6.25 mg Oral BID WC  . digoxin   0.0625 mg Oral Daily  . fenofibrate  54 mg Oral QHS  . potassium chloride SA  20 mEq Oral Daily  . simvastatin  40 mg Oral q1800  . sodium chloride  3 mL Intravenous Q12H  . spironolactone  12.5 mg Oral Daily  . Tadalafil (PAH)  40 mg Oral Daily  . Warfarin - Pharmacist Dosing Inpatient   Does not apply q1800    Infusions: . milrinone 0.375 mcg/kg/min (10/22/12 0600)    PRN Medications: sodium chloride, acetaminophen, ondansetron (ZOFRAN) IV, sodium chloride, sodium chloride   Assessment:  1. A/C systolic heart failure  09/28/12 EF 15%   On home Milrionone 2. Atrial fibrillation - s/p recent DC-CV 3. H/o stroke  4. HTN  5. LBBB 6. Chronic Renal Failure  - creatinine baseline 1.4-1.5 7. PAH - on adcirca    Plan/Discussion:    Feeling much better this am. Volume status improved - weight below any previous clinic weights. Will check CVP and co-ox. Resume home lasix dose. Continue Milrinone 0.375 mcg. Supplement potassium.   Overall much improved since previous. Plan will be to consider CRT-D and see if we can then wean milrinone. I have d/w Dr. Ladona Ridgel.  Continue coumadin for AF.  Consult cardiac rehab.   Length of Stay: 1   Ariellah Faust 10/22/2012, 8:12 AM

## 2012-10-22 NOTE — Progress Notes (Signed)
ANTICOAGULATION CONSULT NOTE - Initial Consult  Pharmacy Consult for Coumadin and Milrinone Infusion Indication: atrial fibrillation + CHF  Allergies  Allergen Reactions  . Lisinopril Rash    Patient Measurements: Height: 5\' 7"  (170.2 cm) Weight: 140 lb 10.5 oz (63.8 kg) IBW/kg (Calculated) : 66.1  Vital Signs: Temp: 97.5 F (36.4 C) (05/05 0800) Temp src: Other (Comment) (05/05 0800) BP: 107/53 mmHg (05/05 0900) Pulse Rate: 89 (05/05 0900)  Labs:  Recent Labs  10/21/12 1521 10/21/12 1546 10/22/12 0512  HGB 14.3 15.3  --   HCT 40.7 45.0  --   PLT 283  --   --   APTT 35  --   --   LABPROT 17.0*  --  17.9*  INR 1.42  --  1.52*  CREATININE 1.50* 1.40* 1.39*    Estimated Creatinine Clearance: 40.2 ml/min (by C-G formula based on Cr of 1.39).  Medications: see med rec  Assessment: CP and SOB x 3 days. Pt only speaks [place].  PMH: HTN, HLD, CHF, Bil. Bundle branch block, LBBB, seizures, CAD  Anticoag: Coumadin PTA for PAF.INR only 1.42 on admission, up to 1.52 this morning. No bleeding issues noted Last coumadin clinic visit INR was 1.9 - dose=4mg  daily except 2mg  on friday  Cards: CHF on home milrinone. proBNP [id number]. Digoxin level 0.7. Home dose listed as 42mcg/min. Consult to EP to consider CRT-D pending. Continue adcirca for [facility]  Renal: Scr 1.5 with estimated CrCl 40. Resume lasix today  Goal of Therapy:  INR 2-3 Monitor platelets by anticoagulation protocol: Yes   Plan:  Repeat Coumadin 7.5mg  po x 1 tonight Daily PT/INR Continue milrinone at 0.375 mcg/kg/mim  Sheppard Coil PharmD., BCPS Clinical Pharmacist Pager [phone] 10/22/2012 10:49 AM

## 2012-10-22 NOTE — Progress Notes (Signed)
CARDIAC REHAB PHASE I   PRE:  Rate/Rhythm: 82SR PVCs  BP:  Supine: 92/54  Sitting:   Standing:    SaO2: 98%RA  MODE:  Ambulation: 450 ft   POST:  Rate/Rhythm: 88SR PVCs  BP:  Supine:   Sitting: 107/53  Standing:    SaO2: 97%RA 1045-1125 Pt walked 450 ft on RA with rolling walker and asst x 1. Gait steady. Tolerated well. To recliner after walk. Call bell in reach. Sats good on RA. Denied SOB.   Luetta Nutting, RN BSN  10/22/2012 11:20 AM

## 2012-10-22 NOTE — Care Management Note (Signed)
    Page 1 of 2   10/22/2012     12:44:10 PM   CARE MANAGEMENT NOTE 10/22/2012  Patient:  Tyler Christensen, Tyler Christensen   Account Number:  [id number]  Date Initiated:  10/22/2012  Documentation initiated by:  Junius Creamer  Subjective/Objective Assessment:   adm w heart failure     Action/Plan:   lives w fam, pcp dr Karren Burly williams, act w [facility] for home milrinone, have alerted debbie taylor w [facility] of poss dc on 5-6   Anticipated DC Date:  10/23/2012   Anticipated DC Plan:  HOME W HOME HEALTH SERVICES      DC Planning Services  CM consult      [facility] Choice  Resumption Of Svcs/PTA Provider   Choice offered to / List presented to:     DME arranged  IV PUMP/EQUIPMENT      DME agency  [facility]     [facility] arranged  HH-1 RN  HH-10 DISEASE MANAGEMENT      HH agency  [facility]   Status of service:   Medicare Important Message given?   (If response is "NO", the following Medicare IM given date fields will be blank) Date Medicare IM given:   Date Additional Medicare IM given:    Discharge Disposition:  HOME W HOME HEALTH SERVICES  Per UR Regulation:  Reviewed for med. necessity/level of care/duration of stay  If discussed at Long Length of Stay Meetings, dates discussed:    Comments:  5/5 1242 debbie Jazmene Racz rn,bsn

## 2012-10-23 ENCOUNTER — Other Ambulatory Visit: Payer: Self-pay | Admitting: *Deleted

## 2012-10-23 ENCOUNTER — Encounter (HOSPITAL_COMMUNITY): Payer: Self-pay | Admitting: Pharmacy Technician

## 2012-10-23 ENCOUNTER — Telehealth: Payer: Self-pay

## 2012-10-23 DIAGNOSIS — I4891 Unspecified atrial fibrillation: Secondary | ICD-10-CM

## 2012-10-23 DIAGNOSIS — I509 Heart failure, unspecified: Secondary | ICD-10-CM

## 2012-10-23 DIAGNOSIS — I447 Left bundle-branch block, unspecified: Secondary | ICD-10-CM

## 2012-10-23 DIAGNOSIS — I428 Other cardiomyopathies: Secondary | ICD-10-CM

## 2012-10-23 LAB — BASIC METABOLIC PANEL
Calcium: 8.9 mg/dL (ref 8.4–10.5)
GFR calc Af Amer: 52 mL/min — ABNORMAL LOW (ref >90)
GFR calc non Af Amer: 45 mL/min — ABNORMAL LOW (ref >90)
Potassium: 3.6 mEq/L (ref 3.5–5.1)
Sodium: 138 mEq/L (ref 135–145)

## 2012-10-23 LAB — PROTIME-INR: INR: 2.09 — ABNORMAL HIGH (ref 0.00–1.49)

## 2012-10-23 LAB — CARBOXYHEMOGLOBIN
Methemoglobin: 1 % (ref 0.0–1.5)
Total hemoglobin: 13.3 g/dL — ABNORMAL LOW (ref 13.5–18.0)

## 2012-10-23 MED ORDER — FUROSEMIDE 80 MG PO TABS
80.0000 mg | ORAL_TABLET | Freq: Every day | ORAL | Status: DC
  Administered 2012-10-23: 80 mg via ORAL
  Filled 2012-10-23: qty 1

## 2012-10-23 MED ORDER — WARFARIN SODIUM 4 MG PO TABS
4.0000 mg | ORAL_TABLET | Freq: Once | ORAL | Status: DC
  Filled 2012-10-23: qty 1

## 2012-10-23 MED ORDER — FUROSEMIDE 10 MG/ML IJ SOLN
40.0000 mg | Freq: Once | INTRAMUSCULAR | Status: AC
  Administered 2012-10-23: 40 mg via INTRAVENOUS
  Filled 2012-10-23: qty 4

## 2012-10-23 NOTE — Discharge Summary (Signed)
Advanced Heart Failure Team  Discharge Summary   Patient ID: Tyler Christensen MRN: [id number], DOB/AGE: 10/15/1932 79 y.o. Admit date: 10/21/2012 D/C date:     10/23/2012   Primary Discharge Diagnoses:  1. A/C systolic heart failure 09/28/12 EF 15%  On home Milrionone  2. Atrial fibrillation - s/p recent DC-CV  3. H/o stroke  4. HTN  5. LBBB  6. Chronic Renal Failure - creatinine baseline 1.4-1.5  7. PAH - on [facility] Christensen Course:   79 y.o. Tyler Christensen male w/ PMHx significant for Chronic Systolic CHF (EF 66%), NICM (normal coronary anatomy 2005), LBBB, PAF (on coumadin),PAH-on adcirca, HTN, HLD, CVA, and Seizure (2/2 CVA 05/2012). In January he was in Afib and underwent TEE that showed left atrial clot and he was placed on coumadin for a month followed by successful DC-CV 08/14/12. He is on chronic home Milrinone 0.375 mcg via PICC. He has been wearing lifevest at home in anticipation of CRT-D. He speaks very little [place].    Tyler Christensen was admitted with increased dyspnea. CXR revealed mild pulmonary edema. Admit Pro BNP 8696 and Creatinine 1.5. He received IV lasix and was transitioned to home diuretic regimen 80 mg po lasix daily. Overall he diuresed 5 pounds. He continued on Milrinone via existing PICC with stable CO-OX . He remained in a SR. Coumadin therapeutic at the time discharge. He continued to improve and at the time of discharged he was ambulating without dyspnea. Discharge weight 140 pounds.   He will continue life vest until CRT-D placed. Discussed with Dr Tyler Christensen and he plans  for CRT-D on 10/26/12. Will hold coumadin 10/26/11. EP to contact him regarding procedure time.    He will continue to be followed by Tyler Christensen for home PICC maintainance  and home Milrinone at 0.375 mcg. Provided spanish heart failure discharge instructions. Follow up in HF clinic Nov 07, 2012 at 1:30.       Discharge Weight Range: Discharge weight  140 pounds Discharge Vitals: Blood pressure 110/72, pulse 89,  temperature 97.4 F (36.3 C), temperature source Oral, resp. rate 27, height 5\' 7"  (1.702 m), weight 63.7 kg (140 lb 6.9 oz), SpO2 97.00%.  Labs: Lab Results  Component Value Date   WBC 8.2 10/21/2012   HGB 15.3 10/21/2012   HCT 45.0 10/21/2012   MCV 90.8 10/21/2012   PLT 283 10/21/2012    Recent Labs Lab 10/21/12 1521  10/23/12 0344  NA 139  < > 138  K 4.1  < > 3.6  CL 100  < > 100  CO2 28  < > 25  BUN 25*  < > 30*  CREATININE 1.50*  < > 1.46*  CALCIUM 9.5  < > 8.9  PROT 7.7  --   --   BILITOT 0.6  --   --   ALKPHOS 73  --   --   ALT 5  --   --   AST 20  --   --   GLUCOSE 147*  < > 138*  < > = values in this interval not displayed. Lab Results  Component Value Date   CHOL 128 06/12/2012   HDL 27* 06/12/2012   LDLCALC 78 06/12/2012   TRIG 113 06/12/2012   BNP (last 3 results)  Recent Labs  06/11/12 0401 06/22/12 1434 10/21/12 1521  PROBNP 15163.0* 32268.0* 8696.0*    Diagnostic Studies/Procedures   No results found.  Discharge Medications     Medication List    TAKE these medications  carvedilol 6.25 MG tablet  Commonly known as:  COREG  Take 6.25 mg by mouth 2 (two) times daily with a meal.     digoxin 0.125 MG tablet  Commonly known as:  LANOXIN  Take 0.5 tablets (0.0625 mg total) by mouth daily.     furosemide 40 MG tablet  Commonly known as:  LASIX  Take 2 tablets (80 mg total) by mouth daily.     milrinone 20 MG/100ML Soln infusion  Commonly known as:  PRIMACOR  Inject 25.3125 mcg/min into the vein continuous.     potassium chloride SA 20 MEQ tablet  Commonly known as:  K-DUR,KLOR-CON  Take 20 mEq by mouth daily.     simvastatin 20 MG tablet  Commonly known as:  ZOCOR  Take 40 mg by mouth daily.     spironolactone 25 MG tablet  Commonly known as:  ALDACTONE  Take 0.5 tablets (12.5 mg total) by mouth daily.     Tadalafil (PAH) 20 MG Tabs  Take 2 tablets (40 mg total) by mouth daily.     TRILIPIX 45 MG capsule  Generic drug:   Choline Fenofibrate  Take 45 mg by mouth daily.     warfarin 4 MG tablet  Commonly known as:  COUMADIN  Take 2-4 mg by mouth daily. 2 mg on Friday; 4mg  the rest of the week        Disposition   The patient will be discharged in stable condition to home. Discharge Orders   Future Appointments Provider Department Dept Phone   11/07/2012 1:30 PM [facility] [phone]   11/09/2012 2:15 PM Marinus Maw, MD [facility] Main [facility] [place]) [phone]   Future Orders Complete By Expires     (HEART FAILURE PATIENTS) Call MD:  Anytime you have any of the following symptoms: 1) 3 pound weight gain in 24 hours or 5 pounds in 1 week 2) shortness of breath, with or without a dry hacking cough 3) swelling in the hands, feet or stomach 4) if you have to sleep on extra pillows at night in order to breathe.  As directed     Care order/instruction  As directed     Comments:      Continue picc. Do not remove    Contraindication to ACEI at discharge  As directed     Comments:      Renal failue    Diet - low sodium heart healthy  As directed     Heart Failure patients record your daily weight using the same scale at the same time of day  As directed     Increase activity slowly  As directed       Follow-up Information   Follow up with Tyler Meres, MD On 11/07/2012. (at 1:30 Garage Code [id number])    Contact information:   [address] [place] [address] [phone]         Duration of Discharge Encounter: Greater than 35 minutes   Signed, Truman Hayward 5:06 PM

## 2012-10-23 NOTE — Progress Notes (Signed)
Advanced Heart Failure Rounding Note   Subjective:    79 y.o. [name] w/ PMHx significant for Chronic Systolic CHF (EF 16%), NICM (normal coronary anatomy 2005), LBBB, PAF (on coumadin),PAH-on adcirca,  HTN, HLD, CVA, and Seizure (2/2 CVA 05/2012). In January he was in Afib and underwent TEE that showed left atrial clot and he was placed on coumadin for a month followed by  successful DC-CV 08/14/12.  He is on chronic home Milrinone 0.375 mcg via PICC. He has been wearing lifevest at home in anticipation of BIVI.   07/06/12 RHC  RA = 9  RV = 74/1/7  PA = 75/32 (47)  PCW = 34  Fick cardiac output/index = 3.9/2.1  PVR = 3.3 Woods  FA sat = 92%  PA sat = 56%, 56%    Yesterday diuretics held. Weight unchanged 140 pounds. Denies SOB/PND/Orthopnea. Renal function stable. Co-ox 60 pending. INR 2.05. Ambulating without difficulty.  CVP 10 personally checked    Objective:   Weight Range:  Vital Signs:   Temp:  [97.4 F (36.3 C)-98.3 F (36.8 C)] 97.4 F (36.3 C) (05/06 1100) Pulse Rate:  [46-92] 89 (05/06 1100) Resp:  [13-28] 27 (05/06 1100) BP: (88-161)/(29-83) 110/72 mmHg (05/06 1100) SpO2:  [92 %-100 %] 97 % (05/06 1100) Weight:  [63.7 kg (140 lb 6.9 oz)] 63.7 kg (140 lb 6.9 oz) (05/06 0500) Last BM Date: 10/22/12  Weight change: Filed Weights   10/21/12 2134 10/22/12 0500 10/23/12 0500  Weight: 64.4 kg (141 lb 15.6 oz) 63.8 kg (140 lb 10.5 oz) 63.7 kg (140 lb 6.9 oz)    Intake/Output:   Intake/Output Summary (Last 24 hours) at 10/23/12 1232 Last data filed at 10/23/12 1000  Gross per 24 hour  Intake  867.2 ml  Output    600 ml  Net  267.2 ml     Physical Exam: CVP 10 General:  Well appearing. No resp difficulty HEENT: normal Neck: supple. JVP 9-10. Carotids 2+ bilat; no bruits. No lymphadenopathy or thryomegaly appreciated. Cor: PMI nondisplaced. Regular rate & rhythm. No rubs, gallops or murmurs. Lungs: clear Abdomen: soft, nontender, nondistended. No  hepatosplenomegaly. No bruits or masses. Good bowel sounds. Extremities: no cyanosis, clubbing, rash, edema RUE PICC  Neuro: alert & orientedx3, cranial nerves grossly intact. moves all 4 extremities w/o difficulty. Affect pleasant  Telemetry: SR 75  Labs: Basic Metabolic Panel:  Recent Labs Lab 10/21/12 1521 10/21/12 1546 10/22/12 0512 10/23/12 0344  NA 139 141 140 138  K 4.1 3.7 3.5 3.6  CL 100 101 100 100  CO2 28  --  30 25  GLUCOSE 147* 137* 108* 138*  BUN 25* 25* 27* 30*  CREATININE 1.50* 1.40* 1.39* 1.46*  CALCIUM 9.5  --  8.9 8.9    Liver Function Tests:  Recent Labs Lab 10/21/12 1521  AST 20  ALT 5  ALKPHOS 73  BILITOT 0.6  PROT 7.7  ALBUMIN 4.1   No results found for this basename: LIPASE, AMYLASE,  in the last 168 hours No results found for this basename: AMMONIA,  in the last 168 hours  CBC:  Recent Labs Lab 10/21/12 1521 10/21/12 1546  WBC 8.2  --   NEUTROABS 6.2  --   HGB 14.3 15.3  HCT 40.7 45.0  MCV 90.8  --   PLT 283  --     Cardiac Enzymes: No results found for this basename: CKTOTAL, CKMB, CKMBINDEX, TROPONINI,  in the last 168 hours  BNP: BNP (last 3  results)  Recent Labs  06/11/12 0401 06/22/12 1434 10/21/12 1521  PROBNP 15163.0* 32268.0* 8696.0*     Other results:    Imaging: Dg Chest 2 View  10/21/2012  *RADIOLOGY REPORT*  Clinical Data: Chest pain.  CHEST - 2 VIEW  Comparison: July 03, 2012.  Findings: Stable mild cardiomegaly.  Right-sided PICC line is noted.  Mild central pulmonary vascular congestion is noted.  No pneumothorax is noted  IMPRESSION: Mild central pulmonary vascular congestion with associated minimal pleural effusions.   Original Report Authenticated By: Lupita Raider.,  M.D.      Medications:     Scheduled Medications: . antiseptic oral rinse  15 mL Mouth Rinse BID  . carvedilol  6.25 mg Oral BID WC  . digoxin  0.0625 mg Oral Daily  . fenofibrate  54 mg Oral QHS  . furosemide  80 mg Oral  Daily  . potassium chloride SA  20 mEq Oral Daily  . simvastatin  40 mg Oral q1800  . sodium chloride  3 mL Intravenous Q12H  . spironolactone  12.5 mg Oral Daily  . Tadalafil (PAH)  40 mg Oral Daily  . warfarin  4 mg Oral ONCE-1800  . Warfarin - Pharmacist Dosing Inpatient   Does not apply q1800    Infusions: . milrinone 0.375 mcg/kg/min (10/23/12 0512)    PRN Medications: sodium chloride, acetaminophen, ondansetron (ZOFRAN) IV, sodium chloride, sodium chloride   Assessment:  1. A/C systolic heart failure  09/28/12 EF 15%   On home Milrionone 2. Atrial fibrillation - s/p recent DC-CV 3. H/o stroke  4. HTN  5. LBBB 6. Chronic Renal Failure  - creatinine baseline 1.4-1.5 7. PAH - on adcirca    Plan/Discussion:    Feels better. Volume status still up a bit. CVP ~10. Co-OX 60.8%. Weight unchanged 140 pounds. Give one dose of 40 mg of IV lasix prior to discharge. Will resume home diuretic regimen once discharged. Continue Milrinone 0.375 mcg. Supplement potassium. [facility] to resume services.   Plan for CRT-D as an outpatient on Friday by Dr Ladona Ridgel.   Continue coumadin for AF. INR 2.09  Plan to discharge home today. D/w EP.  Length of Stay: 2 [place] 12:33 PM

## 2012-10-23 NOTE — Progress Notes (Signed)
ANTICOAGULATION CONSULT NOTE   Pharmacy Consult for Coumadin and Milrinone Infusion Indication: atrial fibrillation + CHF  Allergies  Allergen Reactions  . Lisinopril Rash    Patient Measurements: Height: 5\' 7"  (170.2 cm) Weight: 140 lb 6.9 oz (63.7 kg) IBW/kg (Calculated) : 66.1  Vital Signs: Temp: 97.4 F (36.3 C) (05/06 1100) Temp src: Oral (05/06 1100) BP: 110/72 mmHg (05/06 1100) Pulse Rate: 89 (05/06 1100)  Labs:  Recent Labs  10/21/12 1521 10/21/12 1546 10/22/12 0512 10/23/12 0344  HGB 14.3 15.3  --   --   HCT 40.7 45.0  --   --   PLT 283  --   --   --   APTT 35  --   --   --   LABPROT 17.0*  --  17.9* 22.6*  INR 1.42  --  1.52* 2.09*  CREATININE 1.50* 1.40* 1.39* 1.46*    Estimated Creatinine Clearance: 38.2 ml/min (by C-G formula based on Cr of 1.46).  Medications: see med rec  Assessment: CP and SOB x 3 days. Pt only speaks [place].  PMH: HTN, HLD, CHF, Bil. Bundle branch block, LBBB, seizures, CAD  Anticoag: Coumadin PTA for PAF.INR only 1.42 on admission, up to 2.0 this morning. No bleeding issues noted Last coumadin clinic visit INR was 1.9 - dose=4mg  daily except 2mg  on friday  Cards: CHF on home milrinone. proBNP [id number]. Digoxin level 0.7. Home dose listed as 34mcg/min. Continue adcirca for [facility]  Renal: Scr 1.5 with estimated CrCl 40. Resume home lasix at discharge  Goal of Therapy:  INR 2-3 Monitor platelets by anticoagulation protocol: Yes   Plan:  Resume home dose of coumadin at discharge - have him follow with at coumadin clinic as appropriate Daily PT/INR Continue milrinone at 0.375 mcg/kg/mim  Sheppard Coil PharmD., BCPS Clinical Pharmacist Pager [phone] 10/23/2012 11:09 AM

## 2012-10-23 NOTE — Progress Notes (Signed)
CARDIAC REHAB PHASE I   PRE:  Rate/Rhythm: 86 SR    BP: sitting 105/49    SaO2: 98 RA  MODE:  Ambulation: 700 ft   POST:  Rate/Rhythm: 100 with PVCs    BP: sitting 107/69     SaO2: 98 RA  Tolerated well. No SOB. Feels good. PVCs with walking. Sts he could have walked 3 more laps. Reviewed daily wts and low sodium. [phone]   Elissa Lovett Spencer CES, ACSM 10/23/2012 10:51 AM

## 2012-10-23 NOTE — Progress Notes (Signed)
Interpreter Wyvonnia Dusky for [name] NP Kai Levins RN

## 2012-10-23 NOTE — Progress Notes (Signed)
Pt placed on home milrenone by [facility]. Discharge instructions given by Tonye Becket NP and again by S.Xavious Sharrar Charity fundraiser. Translated into spanish by [place] all questions answered while she was present.

## 2012-10-24 ENCOUNTER — Inpatient Hospital Stay (HOSPITAL_COMMUNITY): Admission: RE | Admit: 2012-10-24 | Payer: Medicare Other | Source: Ambulatory Visit

## 2012-10-25 MED ORDER — CEFAZOLIN SODIUM-DEXTROSE 2-3 GM-% IV SOLR
2.0000 g | INTRAVENOUS | Status: DC
  Filled 2012-10-25 (×2): qty 50

## 2012-10-25 MED ORDER — CHLORHEXIDINE GLUCONATE 4 % EX LIQD
60.0000 mL | Freq: Once | CUTANEOUS | Status: DC
  Filled 2012-10-25: qty 60

## 2012-10-25 MED ORDER — SODIUM CHLORIDE 0.9 % IV SOLN
INTRAVENOUS | Status: DC
  Administered 2012-10-26: 07:00:00 via INTRAVENOUS

## 2012-10-25 MED ORDER — GENTAMICIN SULFATE 40 MG/ML IJ SOLN
80.0000 mg | INTRAMUSCULAR | Status: DC
  Filled 2012-10-25: qty 2

## 2012-10-26 ENCOUNTER — Ambulatory Visit (HOSPITAL_COMMUNITY)
Admission: RE | Admit: 2012-10-26 | Discharge: 2012-10-27 | Disposition: A | Discharge status: Home / Self Care | Payer: Medicare Other | Source: Ambulatory Visit | Attending: Internal Medicine | Admitting: Internal Medicine

## 2012-10-26 ENCOUNTER — Encounter (HOSPITAL_COMMUNITY)
Admission: RE | Disposition: A | Discharge status: Home / Self Care | Payer: Self-pay | Source: Ambulatory Visit | Attending: Internal Medicine

## 2012-10-26 DIAGNOSIS — I4891 Unspecified atrial fibrillation: Secondary | ICD-10-CM | POA: Insufficient documentation

## 2012-10-26 DIAGNOSIS — I959 Hypotension, unspecified: Secondary | ICD-10-CM | POA: Insufficient documentation

## 2012-10-26 DIAGNOSIS — I452 Bifascicular block: Secondary | ICD-10-CM | POA: Insufficient documentation

## 2012-10-26 DIAGNOSIS — I509 Heart failure, unspecified: Secondary | ICD-10-CM | POA: Insufficient documentation

## 2012-10-26 DIAGNOSIS — I5022 Chronic systolic (congestive) heart failure: Secondary | ICD-10-CM

## 2012-10-26 DIAGNOSIS — I428 Other cardiomyopathies: Secondary | ICD-10-CM

## 2012-10-26 DIAGNOSIS — E785 Hyperlipidemia, unspecified: Secondary | ICD-10-CM | POA: Insufficient documentation

## 2012-10-26 DIAGNOSIS — I2789 Other specified pulmonary heart diseases: Secondary | ICD-10-CM | POA: Insufficient documentation

## 2012-10-26 DIAGNOSIS — I447 Left bundle-branch block, unspecified: Secondary | ICD-10-CM

## 2012-10-26 DIAGNOSIS — I252 Old myocardial infarction: Secondary | ICD-10-CM | POA: Insufficient documentation

## 2012-10-26 DIAGNOSIS — E860 Dehydration: Secondary | ICD-10-CM | POA: Insufficient documentation

## 2012-10-26 DIAGNOSIS — I10 Essential (primary) hypertension: Secondary | ICD-10-CM | POA: Insufficient documentation

## 2012-10-26 DIAGNOSIS — E7800 Pure hypercholesterolemia, unspecified: Secondary | ICD-10-CM | POA: Insufficient documentation

## 2012-10-26 HISTORY — DX: Cerebral infarction, unspecified: I63.9

## 2012-10-26 HISTORY — PX: BI-VENTRICULAR IMPLANTABLE CARDIOVERTER DEFIBRILLATOR: SHX5459

## 2012-10-26 HISTORY — DX: Intracardiac thrombosis, not elsewhere classified: I51.3

## 2012-10-26 HISTORY — DX: Unspecified atrial fibrillation: I48.91

## 2012-10-26 HISTORY — DX: Pulmonary hypertension, unspecified: I27.20

## 2012-10-26 LAB — PROTIME-INR
INR: 2.35 — ABNORMAL HIGH (ref 0.00–1.49)
Prothrombin Time: 24.7 seconds — ABNORMAL HIGH (ref 11.6–15.2)

## 2012-10-26 LAB — SURGICAL PCR SCREEN: Staphylococcus aureus: NEGATIVE

## 2012-10-26 SURGERY — BI-VENTRICULAR IMPLANTABLE CARDIOVERTER DEFIBRILLATOR  (CRT-D)
Anesthesia: LOCAL

## 2012-10-26 MED ORDER — MUPIROCIN 2 % EX OINT
TOPICAL_OINTMENT | Freq: Two times a day (BID) | CUTANEOUS | Status: DC
  Administered 2012-10-26: 07:00:00 via NASAL

## 2012-10-26 MED ORDER — MIDAZOLAM HCL 5 MG/5ML IJ SOLN
INTRAMUSCULAR | Status: AC
  Filled 2012-10-26: qty 5

## 2012-10-26 MED ORDER — WARFARIN SODIUM 4 MG PO TABS
4.0000 mg | ORAL_TABLET | ORAL | Status: DC
  Administered 2012-10-27: 4 mg via ORAL
  Filled 2012-10-26: qty 1

## 2012-10-26 MED ORDER — CEFAZOLIN SODIUM 1-5 GM-% IV SOLN
1.0000 g | Freq: Four times a day (QID) | INTRAVENOUS | Status: DC
  Filled 2012-10-26 (×2): qty 50

## 2012-10-26 MED ORDER — CEFAZOLIN SODIUM 1-5 GM-% IV SOLN
1.0000 g | Freq: Four times a day (QID) | INTRAVENOUS | Status: AC
  Administered 2012-10-26 – 2012-10-27 (×2): 1 g via INTRAVENOUS
  Filled 2012-10-26 (×3): qty 50

## 2012-10-26 MED ORDER — DIGOXIN 0.0625 MG HALF TABLET
0.0625 mg | ORAL_TABLET | Freq: Every day | ORAL | Status: DC
  Administered 2012-10-26 – 2012-10-27 (×2): 0.0625 mg via ORAL
  Filled 2012-10-26 (×2): qty 1

## 2012-10-26 MED ORDER — ONDANSETRON HCL 4 MG/2ML IJ SOLN: 4.0000 mg | Freq: Four times a day (QID) | INTRAMUSCULAR | Status: DC | PRN

## 2012-10-26 MED ORDER — FENOFIBRATE 54 MG PO TABS
54.0000 mg | ORAL_TABLET | Freq: Every day | ORAL | Status: DC
  Administered 2012-10-26 – 2012-10-27 (×2): 54 mg via ORAL
  Filled 2012-10-26 (×2): qty 1

## 2012-10-26 MED ORDER — LIDOCAINE HCL (PF) 1 % IJ SOLN
INTRAMUSCULAR | Status: AC
  Filled 2012-10-26: qty 60

## 2012-10-26 MED ORDER — CEFAZOLIN SODIUM 1-5 GM-% IV SOLN
INTRAVENOUS | Status: AC
  Administered 2012-10-26: 1 g via INTRAVENOUS
  Filled 2012-10-26: qty 100

## 2012-10-26 MED ORDER — MILRINONE IN DEXTROSE 20 MG/100ML IV SOLN
0.3750 ug/kg/min | INTRAVENOUS | Status: DC
  Filled 2012-10-26: qty 100

## 2012-10-26 MED ORDER — TADALAFIL (PAH) 20 MG PO TABS: 40.0000 mg | ORAL_TABLET | Freq: Every day | ORAL | Status: DC

## 2012-10-26 MED ORDER — WARFARIN SODIUM 2 MG PO TABS
2.0000 mg | ORAL_TABLET | ORAL | Status: DC
  Administered 2012-10-26: 2 mg via ORAL
  Filled 2012-10-26: qty 1

## 2012-10-26 MED ORDER — SIMVASTATIN 40 MG PO TABS
40.0000 mg | ORAL_TABLET | Freq: Every day | ORAL | Status: DC
  Administered 2012-10-26 – 2012-10-27 (×2): 40 mg via ORAL
  Filled 2012-10-26 (×2): qty 1

## 2012-10-26 MED ORDER — HEPARIN (PORCINE) IN NACL 2-0.9 UNIT/ML-% IJ SOLN
INTRAMUSCULAR | Status: AC
  Filled 2012-10-26: qty 500

## 2012-10-26 MED ORDER — SPIRONOLACTONE 12.5 MG HALF TABLET
12.5000 mg | ORAL_TABLET | Freq: Every day | ORAL | Status: DC
  Administered 2012-10-26 – 2012-10-27 (×2): 12.5 mg via ORAL
  Filled 2012-10-26 (×2): qty 1

## 2012-10-26 MED ORDER — SODIUM CHLORIDE 0.9 % IJ SOLN: 10.0000 mL | Freq: Two times a day (BID) | INTRAMUSCULAR | Status: DC

## 2012-10-26 MED ORDER — WARFARIN - PHYSICIAN DOSING INPATIENT: Freq: Every day | Status: DC

## 2012-10-26 MED ORDER — CARVEDILOL 6.25 MG PO TABS
6.2500 mg | ORAL_TABLET | Freq: Two times a day (BID) | ORAL | Status: DC
  Administered 2012-10-26 – 2012-10-27 (×3): 6.25 mg via ORAL
  Filled 2012-10-26 (×4): qty 1

## 2012-10-26 MED ORDER — POTASSIUM CHLORIDE CRYS ER 20 MEQ PO TBCR
20.0000 meq | EXTENDED_RELEASE_TABLET | Freq: Every day | ORAL | Status: DC
Start: 2012-10-26 — End: 2012-10-27
  Administered 2012-10-26 – 2012-10-27 (×2): 20 meq via ORAL
  Filled 2012-10-26 (×2): qty 1

## 2012-10-26 MED ORDER — WARFARIN SODIUM 2 MG PO TABS: 2.0000 mg | ORAL_TABLET | Freq: Every day | ORAL | Status: DC

## 2012-10-26 MED ORDER — FUROSEMIDE 80 MG PO TABS
80.0000 mg | ORAL_TABLET | Freq: Every day | ORAL | Status: DC
  Administered 2012-10-26 – 2012-10-27 (×2): 80 mg via ORAL
  Filled 2012-10-26 (×2): qty 1

## 2012-10-26 MED ORDER — MUPIROCIN 2 % EX OINT
TOPICAL_OINTMENT | Freq: Two times a day (BID) | CUTANEOUS | Status: DC
  Filled 2012-10-26 (×2): qty 22

## 2012-10-26 MED ORDER — SODIUM CHLORIDE 0.9 % IJ SOLN
10.0000 mL | INTRAMUSCULAR | Status: DC | PRN
  Administered 2012-10-26: 10 mL

## 2012-10-26 MED ORDER — FENTANYL CITRATE 0.05 MG/ML IJ SOLN
INTRAMUSCULAR | Status: AC
  Filled 2012-10-26: qty 2

## 2012-10-26 MED ORDER — ACETAMINOPHEN 325 MG PO TABS: 325.0000 mg | ORAL_TABLET | ORAL | Status: DC | PRN

## 2012-10-26 NOTE — Progress Notes (Signed)
Orthopedic Tech Progress Note Patient Details:  Tyler Christensen [birth year] [id number]  Ortho Devices Type of Ortho Device: Arm sling Ortho Device/Splint Location: LUE Ortho Device/Splint Interventions: Ordered;Application   Jennye Moccasin 10/26/2012, 5:18 PM

## 2012-10-26 NOTE — Progress Notes (Signed)
Patient on home IV milrinone through right upper arm double lumen picc line.  Per Trula Ore RN IV team, ok to leave patient on his home medication and home pump.  Case Manager Steward Drone made aware to notify home health agency.  Will continue to monitor.  Patient and wife updated.  Colman Cater

## 2012-10-26 NOTE — Op Note (Signed)
BiV ICD implant via the left subclavian vein without immediate complication. Z#[id number].

## 2012-10-26 NOTE — H&P (Signed)
Tyler Christensen is an 79 y.o. male.   Chief Complaint: "I am here for an ICD" HPI: The patient is a 79 year old man with a history of chronic systolic heart failure, left bundle branch block, severe left ventricular dysfunction, who is been maintained on intravenous inotropic support. I saw the patient several months ago. At that time he was thought to be too ill for an ICD. He has subsequently improved. He has been on maximal medical therapy. While his ejection fraction remains severely depressed at 20%, his heart failure has improved from class IV to class III. He has persistent left bundle branch block. He has a nonischemic cardiomyopathy. He has been on maximal medical therapy under the direction of our advanced heart failure clinic. He presents today for ICD implantation.  Past Medical History  Diagnosis Date  . HTN (hypertension)   . High cholesterol   . CHF (congestive heart failure)   . BBBB (bilateral bundle branch block)   . LBBB (left bundle branch block)   . Weakness   . Tired   . Seizures 06/11/2012    new onset/notes (06/11/2012)  . SOB (shortness of breath)     "sometimes when I lay down;; related to not taking my RX" (06/11/2012)  . Myocardial infarction     06/10/2012    Past Surgical History  Procedure Laterality Date  . Throat surgery  1942    "and nose" (06/11/2012); ?T&A  . Inguinal hernia repair  ~ 2008    "both sides" (06/11/2012)  . Tee without cardioversion  06/29/2012    Procedure: TRANSESOPHAGEAL ECHOCARDIOGRAM (TEE);  Surgeon: Dolores Patty, MD;  Location: [facility] ENDOSCOPY;  Service: Cardiovascular;  Laterality: N/A;  will need a spanish interpreter Interpreter will be here at 1330-Hope spoke w/ [place]  . Cardioversion N/A 08/14/2012    Procedure: CARDIOVERSION;  Surgeon: Dolores Patty, MD;  Location: [facility] ENDOSCOPY;  Service: Cardiovascular;  Laterality: N/A;    No family history on file. Social History:  reports that he has never smoked. He has never  used smokeless tobacco. He reports that he does not drink alcohol or use illicit drugs.  Allergies:  Allergies  Allergen Reactions  . Lisinopril Rash    Medications Prior to Admission  Medication Sig Dispense Refill  . carvedilol (COREG) 6.25 MG tablet Take 6.25 mg by mouth 2 (two) times daily with a meal.       . Choline Fenofibrate (TRILIPIX) 45 MG capsule Take 45 mg by mouth daily.      . digoxin (LANOXIN) 0.125 MG tablet Take 0.5 tablets (0.0625 mg total) by mouth daily.  15 tablet  3  . furosemide (LASIX) 40 MG tablet Take 2 tablets (80 mg total) by mouth daily.  30 tablet    . milrinone (PRIMACOR) 20 MG/100ML SOLN infusion Inject 25.3125 mcg/min into the vein continuous.  100 mL    . potassium chloride SA (K-DUR,KLOR-CON) 20 MEQ tablet Take 20 mEq by mouth daily.      . simvastatin (ZOCOR) 20 MG tablet Take 40 mg by mouth daily.       Marland Kitchen spironolactone (ALDACTONE) 25 MG tablet Take 0.5 tablets (12.5 mg total) by mouth daily.  30 tablet  3  . Tadalafil, PAH, 20 MG TABS Take 2 tablets (40 mg total) by mouth daily.  60 tablet  6  . warfarin (COUMADIN) 4 MG tablet Take 2-4 mg by mouth daily. 2 mg on Friday; 4mg  the rest of the week  Results for orders placed during the hospital encounter of 10/26/12 (from the past 48 hour(s))  PROTIME-INR     Status: Abnormal   Collection Time    10/26/12  7:06 AM      Result Value Range   Prothrombin Time 24.7 (*) 11.6 - 15.2 seconds   INR 2.35 (*) 0.00 - 1.49   No results found.  Review of systems - all systems reviewed and negative except as noted in the history of present illness  Physical exam  Blood pressure 110/68, pulse 91, temperature 97.3 F (36.3 C), temperature source Oral, resp. rate 20, height 5\' 7"  (1.702 m), weight 140 lb (63.504 kg), SpO2 98.00%. Well appearing NAD HEENT: Unremarkable Neck:  7 cm JVD, no thyromegally Back:  No CVA tenderness Lungs:  Clear except for scattered basilar rales HEART:  Regular rate  rhythm, no murmurs, no rubs, no clicks Abd:  Flat, positive bowel sounds, no organomegally, no rebound, no guarding Ext:  2 plus pulses, no edema, no cyanosis, no clubbing Skin:  No rashes no nodules Neuro:  CN II through XII intact, motor grossly intact  EKG: Normal sinus rhythm with left bundle branch block, QRS duration 170 ms  Assessment/Plan 1. class III congestive heart failure 2. nonischemic cardiomyopathy 3. left bundle branch block 4. Dyslipidemia Rec: Via the interpreter, I discussed treatment options with the patient. The risk, goals, benefits, and expectations of ICD implantation have been discussed with the patient. We will proceed with biventricular ICD implantation.  Lewayne Bunting, M.D.  Lewayne Bunting 10/26/2012, 7:50 AM

## 2012-10-27 ENCOUNTER — Ambulatory Visit (HOSPITAL_COMMUNITY): Payer: Medicare Other

## 2012-10-27 ENCOUNTER — Encounter (HOSPITAL_COMMUNITY): Payer: Self-pay | Admitting: Physician Assistant

## 2012-10-27 DIAGNOSIS — I428 Other cardiomyopathies: Secondary | ICD-10-CM

## 2012-10-27 LAB — PROTIME-INR: INR: 2.51 — ABNORMAL HIGH (ref 0.00–1.49)

## 2012-10-27 NOTE — Discharge Summary (Signed)
Physician Discharge Summary  Patient ID: Tyler Christensen MRN: [id number] DOB/AGE: [birth year] 79 y.o.  Admit date: 10/26/2012 Discharge date: 10/27/2012  Primary Cardiologist: Nicholes Mango, MD Primary Electrophysiologist: Sharrell Ku, MD  Primary Discharge Diagnosis: 1 NICM-Status post BiV ICD implantation  2 Chronic systolic heart, NYHA class III  3 Hypotension, relative  Secondary Discharge Diagnoses: Past Medical History  Diagnosis Date  . HTN (hypertension)   . High cholesterol   . CHF (congestive heart failure)   . BBBB (bilateral bundle branch block)   . LBBB (left bundle branch block)   . Weakness   . Tired   . Seizures 06/11/2012    new onset/notes (06/11/2012)  . SOB (shortness of breath)     "sometimes when I lay down;; related to not taking my RX" (06/11/2012)  . Myocardial infarction     06/10/2012  . Atrial fibrillation   . Stroke   . Atrial thrombus     left  . Pulmonary HTN    Hospital Course: Patient was admitted for elective placement of an ICD (Medtronic Evera XT CRTD, BiV ICD serial [id number]), successfully performed by Dr. Sharrell Ku on day of admission. There were no noted complications, and patient was cleared for discharge the following morning, in hemodynamically stable condition. Postop CXR yielded probable small R pleural effusion.  Discharge Vitals: Blood pressure 98/43, pulse 76, temperature 97.8 F (36.6 C), temperature source Oral, resp. rate 18, height 5\' 7"  (1.702 m), weight 140 lb (63.504 kg), SpO2 92.00%.  Labs: Lab Results  Component Value Date   WBC 8.2 10/21/2012   HGB 15.3 10/21/2012   HCT 45.0 10/21/2012   MCV 90.8 10/21/2012   PLT 283 10/21/2012    Recent Labs Lab 10/21/12 1521  10/23/12 0344  NA 139  < > 138  K 4.1  < > 3.6  CL 100  < > 100  CO2 28  < > 25  BUN 25*  < > 30*  CREATININE 1.50*  < > 1.46*  CALCIUM 9.5  < > 8.9  ALBUMIN 4.1  --   --   PROT 7.7  --   --   BILITOT 0.6  --   --   ALKPHOS 73  --   --   ALT 5  --    --   AST 20  --   --   GLUCOSE 147*  < > 138*  < > = values in this interval not displayed.  Lab Results  Component Value Date   CHOL 128 06/12/2012   HDL 27* 06/12/2012   LDLCALC 78 06/12/2012   TRIG 113 06/12/2012   Lab Results  Component Value Date   DDIMER 0.29 04/04/2012   Diagnostic Studies: Dg Chest 10/27/2012  No acute findings after ICD implantation.  Probable small right pleural effusion.     DISPOSITION: Stable condition  FOLLOW UP PLANS AND APPOINTMENTS:  Future Appointments Provider Department Dept Phone   11/05/2012 3:00 PM [facility] [employer] Main Office [place]) [phone]   11/07/2012 1:30 PM [facility] [phone]   01/30/2013 11:45 AM Marinus Maw, MD [facility] Main Office [place]) [phone]         Follow-up Information   Follow up with [facility] On 11/05/2012. (At 3:00 PM for wound check)    Contact information:   [address] [place] [address] [phone]      Follow up with [facility] On 11/07/2012. (At 1:30 PM)    Contact information:   [address] [place] [address] [phone]      Follow up with Lewayne Bunting, MD On 01/30/2013. (At 11:45 AM)    Contact information:   [address] [place] [address] [phone]     DISCHARGE MEDICATIONS:   Medication List    ASK your doctor about these medications       carvedilol 6.25 MG tablet  Commonly known as:  COREG  Take 6.25 mg by mouth 2 (two) times daily with a meal.     digoxin 0.125 MG tablet  Commonly known as:  LANOXIN  Take 0.5 tablets (0.0625 mg total) by mouth daily.     furosemide 40 MG tablet  Commonly known as:  LASIX  Take 2 tablets (80 mg total) by mouth daily.     milrinone 20 MG/100ML Soln infusion  Commonly known as:  PRIMACOR  Inject 25.3125 mcg/min into the vein  continuous.     potassium chloride SA 20 MEQ tablet  Commonly known as:  K-DUR,KLOR-CON  Take 20 mEq by mouth daily.     simvastatin 20 MG tablet  Commonly known as:  ZOCOR  Take 40 mg by mouth daily.     spironolactone 25 MG tablet  Commonly known as:  ALDACTONE  Take 0.5 tablets (12.5 mg total) by mouth daily.     Tadalafil (PAH) 20 MG Tabs  Take 2 tablets (40 mg total) by mouth daily.     TRILIPIX 45 MG capsule  Generic drug:  Choline Fenofibrate  Take 45 mg by mouth daily.     warfarin 4 MG tablet  Commonly known as:  COUMADIN  Take 2-4 mg by mouth daily. 2 mg on Friday; 4mg  the rest of the week      BRING ALL MEDICATIONS WITH YOU TO FOLLOW UP APPOINTMENTS  Time spent with patient to include physician time: Greater than 30 minutes, including physician time.  SERPE, EUGENE 10/27/2012, 12:36 PM  Cardiology Attending Patient interviewed and examined. Discussed with Prescott Parma, PA-C.  Above note annotated and modified based upon my findings. Please see progress note of 10/27/2012 for my findings and impression.  Beulah [place] Bing, MD 10/27/2012, 9:00 PM

## 2012-10-27 NOTE — Op Note (Signed)
NAMEKIMBLE, HITCHENS NO.:  [id number]  MEDICAL RECORD NO.:  [id number]  LOCATION:  [id number]                        FACILITY:  [facility]  PHYSICIAN:  Doylene Canning. Ladona Ridgel, MD    DATE OF BIRTH:  1932/08/03  DATE OF PROCEDURE:  10/26/2012 DATE OF DISCHARGE:                              OPERATIVE REPORT   PROCEDURE PERFORMED:  Insertion of a biventricular ICD.  INDICATION:  Nonischemic cardiopathy, chronic systolic heart failure, requiring IV inotropes, and left bundle-branch block despite maximal medical therapy.  INTRODUCTION:  The patient is a very pleasant 79 year old male with a longstanding history of a nonischemic cardiomyopathy and chronic systolic heart failure, ejection fraction 20%.  He has left bundle- branch block.  He initially presented back in January with worsening heart failure, and was thought to be too sick for ICD implantation.  He was placed on intravenous inotropes and had improvement.  Along with maximal medical therapy, and inotropic support therapy, his heart failure improved from class IV to class IIIA.  He is now referred for biventricular ICD insertion and hopes of ultimately weaning him off the inotropic support there.  PROCEDURE:  After informed was obtained, the patient was taken to the diagnostic EP lab in a fasting state.  After usual preparation and draping, intravenous fentanyl and midazolam was given for sedation.  A 30 mL of lidocaine was infiltrated into the left infraclavicular region. A 7-cm incision was carried out over this region.  Electrocautery was utilized to dissect down to the fascial plane.  The left subclavian vein was punctured x3 after 10 mL of IV contrast was injected into the left upper extremity venous system demonstrating that the vein was patent. It should be noted that we had difficulty gaining access to the subclavian vein due to the patient's being relatively dehydrated and difficulty with our access.  After  accomplishing access, the Medtronic model [id number] cm active fixation defibrillation lead serial #[id number] was advanced to the right ventricle and the Medtronic model [id number] cm active fixation pacing lead serial #[id number] was advanced to the right atrium.  Mapping was first carried out in the right ventricle.  At the final site, the R-waves measured 9 mV and the pacing impedance with lead actively fixed was 600 ohms.  Threshold 0.7 V at 0.5 milliseconds. Attention was then turned to the atrial lead.  It was placed in the anterolateral portion of the right atrium, where P-waves measured 3 mV. The pacing impedance was 640 ohms and the threshold was initially 1.5 V at 0.5 milliseconds.  A 10 V pacing did not stimulate with the diaphragm.  With the atrial and the RV leads in satisfactory position, attention was then turned to placement in the left ventricular lead. The coronary sinus guiding catheter was advanced into the coronary sinus with the help of a 6-French hexapolar EP catheter.  Venography of the coronary sinus was then carried out demonstrating a lateral vein, and a posterior vein.  The posterior vein was cannulated, but had a shepherd's crook loop and attempts to advance the guidewire into the posterior vein resulted in dislodgement of the angioplasty guidewire.  As such the lateral vein was chosen for  insertion of the LV lead.  The Medtronic model [id number] cm bipolar pacing lead, serial #[id number] was advanced over the angioplasty guidewire and into the lateral vein at a distance approximately halfway from base to apex.  The pacing threshold was less than a V at 0.5 milliseconds and 10 V paced not stimulate the diaphragm. The pacing impedance was initially a 1000 ohms.  With all of the above, the guiding catheter was liberated from the LV lead in the usual manner. The leads were secured to the subpectoralis fascia with a figure-of- eight silk suture and the sewing sleeves were  secured with silk suture. Electrocautery was utilized to make a subcutaneous pocket.  Antibiotic irrigation was utilized to irrigate the pocket and electrocautery was utilized to assure hemostasis.  The Medtronic Evera XT CRTD, BiV ICD serial [id number] was connected to the atrial RV and LV leads and placed back in the subcutaneous pocket.  The pocket was irrigated with antibiotic irrigation and the incision was closed with 2-0 and 3-0 Vicryl.  At this point, I scrubbed out of the case to supervise the fibrillation threshold testing.  After the patient was more deeply sedated under my direct supervision with intravenous fentanyl and Versed, VF was induced with a T-wave shock.  A single 20 joule shock was delivered, which terminated ventricular fibrillation and restored sinus rhythm.  No additional defibrillation threshold testing was carried out and the benzoin and Steri-Strips were painted on the skin, and pressure dressing was applied, and the patient was returned to his room in satisfactory condition.  COMPLICATIONS:  There were no immediate procedure complications.  RESULTS:  This demonstrate successful insertion of a biventricular ICD in a patient with a nonischemic cardiomyopathy and chronic systolic heart failure.     Doylene Canning. Ladona Ridgel, MD     GWT/MEDQ  D:  10/26/2012  T:  10/27/2012  Job:  [id number]  cc:   Bevelyn Buckles. Bensimhon, MD

## 2012-10-27 NOTE — Progress Notes (Signed)
  Tyler Christensen  79 y.o.  male  Subjective: No complaints; mild soreness at operative site.  Allergy: Lisinopril  Objective: Vital signs in last 24 hours: Temp:  [97.2 F (36.2 C)-97.8 F (36.6 C)] 97.8 F (36.6 C) (05/10 0452) Pulse Rate:  [72-85] 76 (05/10 0900) Resp:  [18] 18 (05/10 0452) BP: (82-123)/(25-58) 98/43 mmHg (05/10 0900) SpO2:  [92 %-97 %] 92 % (05/10 0452)  63.504 kg (140 lb) Body mass index is 21.92 kg/(m^2).  Weight change:  Last BM Date: 10/26/12  Intake/Output from previous day: 05/09 0701 - 05/10 0700 In: 670 [P.O.:600; IV Piggyback:50] Out: 1600 [Urine:1600] Total I/O since admission:  -600 cc  General- Well developed; no acute distress Neck- No JVD Lungs- clear lung fields; normal I:E ratio Thorax-benign operative site with dry dressing, no swelling and mild tenderness Cardiovascular- displaced PMI; normal S1 and S2; prominent S4; regular rhythm with occasional premature Abdomen- normal bowel sounds; soft and non-tender without masses or organomegaly Skin- Warm, no significant lesions Extremities- Nl distal pulses; no edema     Component Value Date/Time   CHOL 128 06/12/2012 1100   TRIG 113 06/12/2012 1100   HDL 27* 06/12/2012 1100   CHOLHDL 4.7 06/12/2012 1100   VLDL 23 06/12/2012 1100   LDLCALC 78 06/12/2012 1100   INR-2.5  Telemetry:   Imaging Studies/Results: Dg Chest 10/27/2012  No acute findings after ICD implantation.  Probable small right pleural effusion.     Medications:  I have reviewed the patient's current medications. Scheduled: . carvedilol  6.25 mg Oral BID WC  . digoxin  0.0625 mg Oral Daily  . fenofibrate  54 mg Oral Daily  . furosemide  80 mg Oral Daily  . potassium chloride SA  20 mEq Oral Daily  . simvastatin  40 mg Oral q1800  . spironolactone  12.5 mg Oral Daily  . Warfarin - Physician Dosing Inpatient   Does not apply q1800   Infusions:   . milrinone     Assessment/Plan: Cardiomyopathy: Status post  biventricular pacemaker implantation without apparent adverse effects. Relative hypotension limits dosing of pharmacologic agents for congestive heart failure. Milrinone is the mainstay of his therapy, and appears effective. Patient will be discharged with appropriate followup appointments.   LOS: 1 day   [place] Bing 10/27/2012, 12:24 PM

## 2012-10-30 ENCOUNTER — Ambulatory Visit (INDEPENDENT_AMBULATORY_CARE_PROVIDER_SITE_OTHER): Payer: Medicare Other | Admitting: Cardiovascular Disease

## 2012-10-30 DIAGNOSIS — I4891 Unspecified atrial fibrillation: Secondary | ICD-10-CM

## 2012-10-30 LAB — POCT INR: INR: 1.6

## 2012-11-05 ENCOUNTER — Ambulatory Visit (INDEPENDENT_AMBULATORY_CARE_PROVIDER_SITE_OTHER): Payer: Medicare Other | Admitting: *Deleted

## 2012-11-05 ENCOUNTER — Encounter: Payer: Self-pay | Admitting: Internal Medicine

## 2012-11-05 DIAGNOSIS — I4891 Unspecified atrial fibrillation: Secondary | ICD-10-CM

## 2012-11-05 LAB — ICD DEVICE OBSERVATION
AL AMPLITUDE: 1.6 mv
ATRIAL PACING ICD: 1.9 pct
HV IMPEDENCE: 63 Ohm
RV LEAD IMPEDENCE ICD: 399 Ohm
VENTRICULAR PACING ICD: 99.8 pct

## 2012-11-05 NOTE — Progress Notes (Signed)
Wound check icd check in clinic. Normal device function. No episodes recorded. Battery longevity 7.9 years. ROV in 3 months w/GT.

## 2012-11-06 ENCOUNTER — Telehealth (HOSPITAL_COMMUNITY): Payer: Self-pay | Admitting: *Deleted

## 2012-11-06 ENCOUNTER — Ambulatory Visit (INDEPENDENT_AMBULATORY_CARE_PROVIDER_SITE_OTHER): Payer: Medicare Other | Admitting: Cardiology

## 2012-11-06 DIAGNOSIS — I4891 Unspecified atrial fibrillation: Secondary | ICD-10-CM

## 2012-11-06 LAB — POCT INR: INR: 2.1

## 2012-11-06 MED ORDER — SPIRONOLACTONE 25 MG PO TABS: 25.0000 mg | ORAL_TABLET | Freq: Every day | ORAL | Status: DC

## 2012-11-06 NOTE — Telephone Encounter (Signed)
[name] from [facility] called, patient is out of Spironolactone due to taking one daily instead of 1/2 tablet daily.  He has been taking one tablet daily since discharge.  Per Laurell Roof will keep patient on one daily.  [facility] aware and have sent in new rx to pharmacy for the increased dose.

## 2012-11-07 ENCOUNTER — Encounter: Payer: Self-pay | Admitting: Internal Medicine

## 2012-11-07 ENCOUNTER — Ambulatory Visit (HOSPITAL_COMMUNITY)
Admit: 2012-11-07 | Discharge: 2012-11-07 | Disposition: A | Discharge status: Home / Self Care | Payer: Medicare Other | Source: Ambulatory Visit | Attending: Internal Medicine | Admitting: Internal Medicine

## 2012-11-07 VITALS — BP 110/52 | HR 92 | Wt 145.0 lb

## 2012-11-07 DIAGNOSIS — I10 Essential (primary) hypertension: Secondary | ICD-10-CM

## 2012-11-07 DIAGNOSIS — I5022 Chronic systolic (congestive) heart failure: Secondary | ICD-10-CM

## 2012-11-07 MED ORDER — CARVEDILOL 6.25 MG PO TABS: 6.2500 mg | ORAL_TABLET | Freq: Two times a day (BID) | ORAL | Status: DC

## 2012-11-07 NOTE — Assessment & Plan Note (Signed)
Well-controlled, continue current regimen 

## 2012-11-07 NOTE — Patient Instructions (Signed)
[employer] tabletas de furosemide en la manana y Neomia Dear in la tarde por [place].

## 2012-11-07 NOTE — Assessment & Plan Note (Signed)
Functional status much improved with placement of CRT-D.  He remains on milrinone at 0.375 mcg/kg/min.  His volume status is mildly elevated today therefore he will double up on his lasix 80 mg in the am and 40 mg in the pm for the next 2 days.  He also had confusion with his carvedilol and was taking a half tablet in the morning and a tablet at night.  Will change to 1 tablet twice daily.  Will follow up in a month with repeat echo.  If EF showing improvement we may be able to wean milrinone down.  I am unsure if he will ever get off of milrinone but he maybe able to tolerate lower dose.  Continue to follow [facility] for PICC line management.

## 2012-11-07 NOTE — Progress Notes (Addendum)
HPI: Tyler Christensen is a 79 y.o. [name] (non-english speaking) w/ PMHx significant for Chronic Systolic CHF (EF 95%), NICM, LBBB, PAF (on coumadin), HTN, HLD, CVA, Pulmonary Hypertension, LAA clot, and Seizure (2/2 CVA 05/2012).   Admitted to [facility] 06/2012 with acute decompensated heart failure. As noted below diagnosed with severe pulmonary hypertension from initial RHC and placed on Milrinone and Revatio (eventually transitioned to tadalafil 40). VQ low probability for pulmonary embolus. Repeat RHC on 07/06/12 with markedly improved PVR and Cardiac Index noted. Discharged on Milrinone at 0.375 mcg via PICC. Anticipate possible DC-CV after 4 weeks of therapeutic anti-coagulation (07/08/12).  May also consider BiVICD in near future. He was not placed on an ACE-I at this point due to soft BP. Discharge Weight 146 pounds   07/02/12 RHC  RA = 15  RV = 94/10/13  PA = 93/42 (59)  PCW = 15  Fick cardiac output/index = 1.9/1.1  PVR = 23.0 Woods  SVR = 3140  FA sat = 95%  PA sat = 31%, 32%  Ao Pressure: 121/60 (84)  LV Pressure: 115/12/14   07/06/12 RHC  RA = 9  RV = 74/1/7  PA = 75/32 (47)  PCW = 34  Fick cardiac output/index = 3.9/2.1  PVR = 3.3 Woods  FA sat = 92%  PA sat = 56%, 56%   08/14/12 S/P successful DC-CV   He returns for post hospital follow up today with an interpreter.  He was admitted for volume overload and dyspnea on 54.  He was diuresed with IV lasix with discharge weight of 140 pounds.  He then had a [employer] BiV ICD placed by Dr. Ladona Ridgel on 10/26/12.  He continues on home milrinone at 0.375 mcg/kg/min that is followed by [facility].  He feels great after his device placement.  Denies dyspnea, orthopnea, or PND.  He is walking around his house without difficulty.  No dizziness or syncope.      NO ACE-I 2/2 rash   ROS: All systems negative except as listed in HPI, PMH and Problem List.  Past Medical History  Diagnosis Date  . HTN (hypertension)   . High cholesterol   . CHF  (congestive heart failure)   . BBBB (bilateral bundle branch block)   . LBBB (left bundle branch block)   . Weakness   . Tired   . Seizures 06/11/2012    new onset/notes (06/11/2012)  . SOB (shortness of breath)     "sometimes when I lay down;; related to not taking my RX" (06/11/2012)  . Myocardial infarction     06/10/2012  . Atrial fibrillation   . Stroke   . Atrial thrombus     left  . Pulmonary HTN     Current Outpatient Prescriptions  Medication Sig Dispense Refill  . carvedilol (COREG) 6.25 MG tablet Take 1/2 tab in AM and 1 tab in PM      . Choline Fenofibrate (TRILIPIX) 45 MG capsule Take 45 mg by mouth daily.      . digoxin (LANOXIN) 0.125 MG tablet Take 0.5 tablets (0.0625 mg total) by mouth daily.  15 tablet  3  . furosemide (LASIX) 40 MG tablet Take 40 mg by mouth 2 (two) times daily. Take extra tab in AM as needed for increased wt and swelling      . milrinone (PRIMACOR) 20 MG/100ML SOLN infusion Inject 25.3125 mcg/min into the vein continuous.  100 mL    . potassium chloride SA (K-DUR,KLOR-CON) 20 MEQ tablet Take  20 mEq by mouth daily.      . simvastatin (ZOCOR) 20 MG tablet Take 40 mg by mouth daily.       Marland Kitchen spironolactone (ALDACTONE) 25 MG tablet Take 1 tablet (25 mg total) by mouth daily.  30 tablet  6  . Tadalafil, PAH, 20 MG TABS Take 2 tablets (40 mg total) by mouth daily.  60 tablet  6  . warfarin (COUMADIN) 4 MG tablet Take 2-4 mg by mouth daily. 2 mg on Friday; 4mg  the rest of the week       No current facility-administered medications for this encounter.     PHYSICAL EXAM: Filed Vitals:   11/07/12 1341  BP: 110/52  Pulse: 92  Weight: 145 lb (65.772 kg)  SpO2: 94%   General:  Well appearing. No resp difficulty  HEENT: normal Neck: supple. JVP 7-8 Carotids 2+ bilaterally; no bruits. No lymphadenopathy or thryomegaly appreciated. Cor: PMI normal. Irregular rate & rhythm. No rubs, gallops or murmurs. Life Vest on Lungs: clear Abdomen: soft,  nontender, nondistended. No hepatosplenomegaly. No bruits or masses. Good bowel sounds. Extremities: no cyanosis, clubbing, rash, extremity edema  RUE PICC  Neuro: alert & orientedx3, cranial nerves grossly intact. Moves all 4 extremities w/o difficulty. Affect pleasant.     ASSESSMENT & PLAN:

## 2012-11-09 ENCOUNTER — Ambulatory Visit: Payer: Medicare Other | Admitting: Internal Medicine

## 2012-11-10 DIAGNOSIS — I428 Other cardiomyopathies: Secondary | ICD-10-CM

## 2012-11-10 DIAGNOSIS — I5023 Acute on chronic systolic (congestive) heart failure: Secondary | ICD-10-CM

## 2012-11-10 DIAGNOSIS — I4891 Unspecified atrial fibrillation: Secondary | ICD-10-CM

## 2012-11-10 DIAGNOSIS — I509 Heart failure, unspecified: Secondary | ICD-10-CM

## 2012-11-10 DIAGNOSIS — I452 Bifascicular block: Secondary | ICD-10-CM

## 2012-11-13 ENCOUNTER — Ambulatory Visit (INDEPENDENT_AMBULATORY_CARE_PROVIDER_SITE_OTHER): Payer: Medicare Other | Admitting: Cardiology

## 2012-11-13 DIAGNOSIS — I4891 Unspecified atrial fibrillation: Secondary | ICD-10-CM

## 2012-11-13 LAB — POCT INR: INR: 2.4

## 2012-11-15 ENCOUNTER — Encounter: Payer: Self-pay | Admitting: Internal Medicine

## 2012-11-15 ENCOUNTER — Other Ambulatory Visit: Payer: Self-pay

## 2012-11-15 MED ORDER — DIGOXIN 125 MCG PO TABS: 0.0625 mg | ORAL_TABLET | Freq: Every day | ORAL | Status: DC

## 2012-11-27 ENCOUNTER — Ambulatory Visit (INDEPENDENT_AMBULATORY_CARE_PROVIDER_SITE_OTHER): Payer: Medicare Other | Admitting: Cardiology

## 2012-11-27 DIAGNOSIS — I4891 Unspecified atrial fibrillation: Secondary | ICD-10-CM

## 2012-11-27 LAB — POCT INR: INR: 2.1

## 2012-11-28 ENCOUNTER — Encounter: Payer: Self-pay | Admitting: Internal Medicine

## 2012-12-05 ENCOUNTER — Other Ambulatory Visit: Payer: Self-pay | Admitting: *Deleted

## 2012-12-05 MED ORDER — WARFARIN SODIUM 4 MG PO TABS: 4.0000 mg | ORAL_TABLET | ORAL | Status: DC

## 2012-12-10 ENCOUNTER — Encounter (HOSPITAL_COMMUNITY): Payer: Self-pay

## 2012-12-10 ENCOUNTER — Ambulatory Visit (HOSPITAL_COMMUNITY)
Admission: RE | Admit: 2012-12-10 | Discharge: 2012-12-10 | Disposition: A | Discharge status: Home / Self Care | Payer: Medicare Other | Source: Ambulatory Visit | Attending: Internal Medicine | Admitting: Internal Medicine

## 2012-12-10 ENCOUNTER — Encounter: Payer: Self-pay | Admitting: Internal Medicine

## 2012-12-10 VITALS — BP 100/48 | HR 78 | Wt 144.8 lb

## 2012-12-10 DIAGNOSIS — Z8673 Personal history of transient ischemic attack (TIA), and cerebral infarction without residual deficits: Secondary | ICD-10-CM | POA: Insufficient documentation

## 2012-12-10 DIAGNOSIS — I447 Left bundle-branch block, unspecified: Secondary | ICD-10-CM | POA: Insufficient documentation

## 2012-12-10 DIAGNOSIS — E7800 Pure hypercholesterolemia, unspecified: Secondary | ICD-10-CM | POA: Insufficient documentation

## 2012-12-10 DIAGNOSIS — I4891 Unspecified atrial fibrillation: Secondary | ICD-10-CM

## 2012-12-10 DIAGNOSIS — I428 Other cardiomyopathies: Secondary | ICD-10-CM | POA: Insufficient documentation

## 2012-12-10 DIAGNOSIS — I5022 Chronic systolic (congestive) heart failure: Secondary | ICD-10-CM | POA: Insufficient documentation

## 2012-12-10 DIAGNOSIS — Z7901 Long term (current) use of anticoagulants: Secondary | ICD-10-CM | POA: Insufficient documentation

## 2012-12-10 DIAGNOSIS — I509 Heart failure, unspecified: Secondary | ICD-10-CM | POA: Insufficient documentation

## 2012-12-10 DIAGNOSIS — I2721 Secondary pulmonary arterial hypertension: Secondary | ICD-10-CM

## 2012-12-10 DIAGNOSIS — E785 Hyperlipidemia, unspecified: Secondary | ICD-10-CM | POA: Insufficient documentation

## 2012-12-10 DIAGNOSIS — I252 Old myocardial infarction: Secondary | ICD-10-CM | POA: Insufficient documentation

## 2012-12-10 DIAGNOSIS — I10 Essential (primary) hypertension: Secondary | ICD-10-CM | POA: Insufficient documentation

## 2012-12-10 DIAGNOSIS — I452 Bifascicular block: Secondary | ICD-10-CM | POA: Insufficient documentation

## 2012-12-10 DIAGNOSIS — I2789 Other specified pulmonary heart diseases: Secondary | ICD-10-CM

## 2012-12-10 MED ORDER — TRAMADOL HCL 50 MG PO TABS: 50.0000 mg | ORAL_TABLET | Freq: Two times a day (BID) | ORAL | Status: DC | PRN

## 2012-12-10 MED ORDER — MILRINONE IN DEXTROSE 20 MG/100ML IV SOLN: 0.2500 ug/kg/min | INTRAVENOUS | Status: DC

## 2012-12-10 NOTE — Assessment & Plan Note (Signed)
He is much improved on milrinone and with CRT. We will try to wean his milrinone slowly. [facility] have [facility] to turn him down to 0.25 mcg per kilogram per minute. I have told him to watch this closely. If he feels bad he is to contact us immediately and we can turn it back up. He has not been on an ACE inhibitor to 2 a rash. As we wean the milrinone I think it will be important to start adding back some other afterload reduction either in the form of an ARB or hydralazine/nitrates. At some point I suspect he'll need another right heart catheterization to reevaluate his pulmonary pressures and RV function to see if he might be a candidate for LVAD therapy.

## 2012-12-10 NOTE — Assessment & Plan Note (Signed)
He is responding well to milrinone and tadalafil we will continue this regimen. As above will likely need repeat right heart catheterization in the future to reevaluate.

## 2012-12-10 NOTE — Assessment & Plan Note (Signed)
Maintaining sinus rhythm after cardioversion. Continue warfarin

## 2012-12-10 NOTE — Progress Notes (Signed)
Patient ID: Tyler Christensen, male   DOB: 06-01-1933, 79 y.o.   MRN: [id number]  HPI: Tyler Christensen is a 79 y.o. Tyler Christensen male (non-english speaking) w/ PMHx significant for chronic systolic CHF (EF 87%), NICM, LBBB, PAF (on coumadin), HTN, HLD, CVA, Pulmonary Hypertension and h/o seizure d/o (2/2 CVA 05/2012).   Admitted to [facility] 06/2012 with acute decompensated heart failure. As noted below diagnosed with severe pulmonary hypertension from initial RHC and placed on Milrinone and Revatio (eventually transitioned to tadalafil 40). VQ low probability for pulmonary embolus. Repeat RHC on 07/06/12 with markedly improved PVR and Cardiac Index. Discharged on Milrinone at 0.375 mcg via PICC. Discharge Weight 146 pounds   07/02/12 RHC  RA = 15  RV = 94/10/13  PA = 93/42 (59)  PCW = 15  Fick cardiac output/index = 1.9/1.1  PVR = 23.0 Woods  SVR = 3140  FA sat = 95%  PA sat = 31%, 32%  Ao Pressure: 121/60 (84)  LV Pressure: 115/12/14   07/06/12 RHC  RA = 9  RV = 74/1/7  PA = 75/32 (47)  PCW = 34  Fick cardiac output/index = 3.9/2.1  PVR = 3.3 Woods  FA sat = 92%  PA sat = 56%, 56%   08/14/12 S/P successful DC-CV of AF  10/26/12 S/P BiV-ICD implant  He returns for follow up with an interpreter.   He says he continues to do very well and feels much better after his BiV ICD implant. He continues on home milrinone at 0.375 mcg/kg/min that is followed by [facility]   Weight at home 142-144 pounds.  He is walking outside 15-20 minutes at a time. Denies CP, DOE, orthopnea, PND or problems with PICC. No bleeding with coumadin.  NO ACE-I 2/2 rash   ROS: All systems negative except as listed in HPI, PMH and Problem List.  Past Medical History  Diagnosis Date  . HTN (hypertension)   . High cholesterol   . CHF (congestive heart failure)   . BBBB (bilateral bundle branch block)   . LBBB (left bundle branch block)   . Weakness   . Tired   . Seizures 06/11/2012    new onset/notes (06/11/2012)  . SOB (shortness of  breath)     "sometimes when I lay down;; related to not taking my RX" (06/11/2012)  . Myocardial infarction     06/10/2012  . Atrial fibrillation   . Stroke   . Atrial thrombus     left  . Pulmonary HTN     Current Outpatient Prescriptions  Medication Sig Dispense Refill  . carvedilol (COREG) 6.25 MG tablet Take 1 tablet (6.25 mg total) by mouth 2 (two) times daily with a meal.      . Choline Fenofibrate (TRILIPIX) 45 MG capsule Take 45 mg by mouth daily.      . digoxin (LANOXIN) 0.125 MG tablet Take 0.5 tablets (0.0625 mg total) by mouth daily.  15 tablet  3  . furosemide (LASIX) 40 MG tablet Take 40 mg by mouth 2 (two) times daily. Take extra tab in AM as needed for increased wt and swelling      . milrinone (PRIMACOR) 20 MG/100ML SOLN infusion Inject 16.875 mcg/min into the vein continuous.  100 mL    . potassium chloride SA (K-DUR,KLOR-CON) 20 MEQ tablet Take 20 mEq by mouth daily.      . simvastatin (ZOCOR) 20 MG tablet Take 40 mg by mouth daily.       Marland Kitchen spironolactone (ALDACTONE)  25 MG tablet Take 1 tablet (25 mg total) by mouth daily.  30 tablet  6  . Tadalafil, PAH, 20 MG TABS Take 2 tablets (40 mg total) by mouth daily.  60 tablet  6  . warfarin (COUMADIN) 4 MG tablet Take 1 tablet (4 mg total) by mouth as directed.  35 tablet  3  . traMADol (ULTRAM) 50 MG tablet Take 1 tablet (50 mg total) by mouth 2 (two) times daily as needed for pain.  60 tablet  0   No current facility-administered medications for this encounter.     PHYSICAL EXAM: Filed Vitals:   12/10/12 1130  BP: 100/48  Pulse: 78  Weight: 144 lb 12.8 oz (65.681 kg)  SpO2: 97%   General:  Elderly, thin male. NAD. Marland Kitchen No resp difficulty Interpreter present HEENT: normal Neck: supple. JVP 6-7 Carotids 2+ bilaterally; no bruits. No lymphadenopathy or thryomegaly appreciated. Cor: PMI laterally displaced. RRR. No s3 or murmur. ICD site well-healed  Lungs: clear Abdomen: soft, nontender, nondistended. No  hepatosplenomegaly. No bruits or masses. Good bowel sounds. Extremities: no cyanosis, clubbing, rash, extremity edema  RUE PICC - no discharge  Neuro: alert & orientedx3, cranial nerves grossly intact. Moves all 4 extremities w/o difficulty. Affect pleasant.     ASSESSMENT & PLAN:

## 2012-12-10 NOTE — Patient Instructions (Addendum)
Follow up in 1 month in with an ECHO  Take Ultra 50 mg twice a day as needed for pain.   We will contact [facility] to decrease Milrinone at 0.25 mcg.   Do the following things EVERYDAY: 1) Weigh yourself in the morning before breakfast. Write it down and keep it in a log. 2) Take your medicines as prescribed 3) Eat low salt foods-Limit salt (sodium) to 2000 mg per day.  4) Stay as active as you can everyday 5) Limit all fluids for the day to less than 2 liters

## 2012-12-18 ENCOUNTER — Telehealth (HOSPITAL_COMMUNITY): Payer: Self-pay | Admitting: *Deleted

## 2012-12-18 ENCOUNTER — Encounter: Payer: Self-pay | Admitting: Internal Medicine

## 2012-12-18 ENCOUNTER — Ambulatory Visit (INDEPENDENT_AMBULATORY_CARE_PROVIDER_SITE_OTHER): Payer: Medicare Other | Admitting: Pharmacist

## 2012-12-18 DIAGNOSIS — I4891 Unspecified atrial fibrillation: Secondary | ICD-10-CM

## 2012-12-18 NOTE — Telephone Encounter (Signed)
Tyler Christensen states she is concerned about pt's PICC line when she went to do dressing change and labs she noticed PICC is pulled out about 3 cm, need chest xray to verify PICC placement, [facility] will go to pt's home to do xray have called them at [phone] and faxed order to them at [phone]

## 2012-12-20 NOTE — Telephone Encounter (Signed)
Received x-ray report, PICC is is seen with distal tip at the superior vena cava, ok per Dr Gala Romney.  Aimee, RN with [facility] aware

## 2012-12-25 ENCOUNTER — Telehealth (HOSPITAL_COMMUNITY): Payer: Self-pay | Admitting: *Deleted

## 2012-12-25 DIAGNOSIS — I5022 Chronic systolic (congestive) heart failure: Secondary | ICD-10-CM

## 2012-12-25 NOTE — Telephone Encounter (Signed)
Received call from Aimee, RN with [facility] she states when she went to do PICC dressing change and the PICC is pulled out about 10-11 cm, she states it is still infusing at this time but she knows it is not in the correct location, discussed w/Dr Bensimhon will leave PICC today and pt will have it replaced tomorrow at 8am, Aimee is aware that pt needs to arrive at radiology at 7:30, she will let pt know

## 2012-12-26 ENCOUNTER — Ambulatory Visit (HOSPITAL_COMMUNITY): Admission: RE | Admit: 2012-12-26 | Payer: Medicare Other | Source: Ambulatory Visit

## 2012-12-26 ENCOUNTER — Ambulatory Visit (HOSPITAL_COMMUNITY)
Admission: RE | Admit: 2012-12-26 | Discharge: 2012-12-26 | Disposition: A | Discharge status: Home / Self Care | Payer: Medicare Other | Source: Ambulatory Visit | Attending: Internal Medicine | Admitting: Internal Medicine

## 2012-12-26 ENCOUNTER — Other Ambulatory Visit (HOSPITAL_COMMUNITY): Payer: Self-pay | Admitting: Internal Medicine

## 2012-12-26 ENCOUNTER — Encounter: Payer: Self-pay | Admitting: Internal Medicine

## 2012-12-26 DIAGNOSIS — I519 Heart disease, unspecified: Secondary | ICD-10-CM | POA: Insufficient documentation

## 2012-12-26 DIAGNOSIS — I5022 Chronic systolic (congestive) heart failure: Secondary | ICD-10-CM

## 2012-12-26 NOTE — Procedures (Signed)
Placement of left arm PICC.  Tip in lower SVC.  No immediate complication.

## 2013-01-01 ENCOUNTER — Ambulatory Visit (INDEPENDENT_AMBULATORY_CARE_PROVIDER_SITE_OTHER): Payer: Medicare Other | Admitting: Cardiology

## 2013-01-01 DIAGNOSIS — I4891 Unspecified atrial fibrillation: Secondary | ICD-10-CM

## 2013-01-01 LAB — POCT INR: INR: 2.8

## 2013-01-04 ENCOUNTER — Encounter: Payer: Self-pay | Admitting: Internal Medicine

## 2013-01-07 ENCOUNTER — Encounter (HOSPITAL_COMMUNITY): Payer: Self-pay

## 2013-01-07 ENCOUNTER — Ambulatory Visit (HOSPITAL_COMMUNITY)
Admission: RE | Admit: 2013-01-07 | Discharge: 2013-01-07 | Disposition: A | Discharge status: Home / Self Care | Payer: Medicare Other | Source: Ambulatory Visit | Attending: Internal Medicine | Admitting: Internal Medicine

## 2013-01-07 ENCOUNTER — Ambulatory Visit (HOSPITAL_BASED_OUTPATIENT_CLINIC_OR_DEPARTMENT_OTHER)
Admission: RE | Admit: 2013-01-07 | Discharge: 2013-01-07 | Disposition: A | Discharge status: Home / Self Care | Payer: Medicare Other | Source: Ambulatory Visit | Attending: Cardiology | Admitting: Cardiology

## 2013-01-07 VITALS — BP 100/56 | HR 75 | Wt 147.4 lb

## 2013-01-07 DIAGNOSIS — I428 Other cardiomyopathies: Secondary | ICD-10-CM | POA: Insufficient documentation

## 2013-01-07 DIAGNOSIS — I059 Rheumatic mitral valve disease, unspecified: Secondary | ICD-10-CM

## 2013-01-07 DIAGNOSIS — I447 Left bundle-branch block, unspecified: Secondary | ICD-10-CM | POA: Insufficient documentation

## 2013-01-07 DIAGNOSIS — I5022 Chronic systolic (congestive) heart failure: Secondary | ICD-10-CM

## 2013-01-07 DIAGNOSIS — I4891 Unspecified atrial fibrillation: Secondary | ICD-10-CM | POA: Insufficient documentation

## 2013-01-07 DIAGNOSIS — I2721 Secondary pulmonary arterial hypertension: Secondary | ICD-10-CM

## 2013-01-07 DIAGNOSIS — E785 Hyperlipidemia, unspecified: Secondary | ICD-10-CM | POA: Insufficient documentation

## 2013-01-07 DIAGNOSIS — Z79899 Other long term (current) drug therapy: Secondary | ICD-10-CM | POA: Insufficient documentation

## 2013-01-07 DIAGNOSIS — I10 Essential (primary) hypertension: Secondary | ICD-10-CM | POA: Insufficient documentation

## 2013-01-07 DIAGNOSIS — I2789 Other specified pulmonary heart diseases: Secondary | ICD-10-CM

## 2013-01-07 DIAGNOSIS — Z8673 Personal history of transient ischemic attack (TIA), and cerebral infarction without residual deficits: Secondary | ICD-10-CM | POA: Insufficient documentation

## 2013-01-07 MED ORDER — LOSARTAN POTASSIUM 25 MG PO TABS: 12.5000 mg | ORAL_TABLET | Freq: Two times a day (BID) | ORAL | Status: DC

## 2013-01-07 MED ORDER — MILRINONE IN DEXTROSE 20 MG/100ML IV SOLN: 0.1250 ug/kg/min | INTRAVENOUS | Status: DC

## 2013-01-07 NOTE — Patient Instructions (Addendum)
Take losartan 12.5 twice a day  Follow up in 1 month  Do the following things EVERYDAY: 1) Weigh yourself in the morning before breakfast. Write it down and keep it in a log. 2) Take your medicines as prescribed 3) Eat low salt foods-Limit salt (sodium) to 2000 mg per day.  4) Stay as active as you can everyday 5) Limit all fluids for the day to less than 2 liters

## 2013-01-07 NOTE — Progress Notes (Signed)
Patient ID: Tyler Christensen, male   DOB: 09-21-1932, 79 y.o.   MRN: [id number]  HPI: Tyler Christensen is a 79 y.o. Tyler Christensen male (non-english speaking) w/ PMHx significant for chronic systolic CHF (EF 40%), NICM, LBBB, PAF (on coumadin), HTN, HLD, CVA, Pulmonary Hypertension and h/o seizure d/o (2/2 CVA 05/2012).   Admitted to [facility] 06/2012 with acute decompensated heart failure. As noted below diagnosed with severe pulmonary hypertension from initial RHC and placed on Milrinone and Revatio (eventually transitioned to tadalafil 40). VQ low probability for pulmonary embolus. Repeat RHC on 07/06/12 with markedly improved PVR and Cardiac Index. Discharged on Milrinone at 0.375 mcg via PICC. Discharge Weight 146 pounds   07/02/12 RHC  RA = 15  RV = 94/10/13  PA = 93/42 (59)  PCW = 15  Fick cardiac output/index = 1.9/1.1  PVR = 23.0 Woods  SVR = 3140  FA sat = 95%  PA sat = 31%, 32%  Ao Pressure: 121/60 (84)  LV Pressure: 115/12/14   07/06/12 RHC  RA = 9  RV = 74/1/7  PA = 75/32 (47)  PCW = 34  Fick cardiac output/index = 3.9/2.1  PVR = 3.3 Woods  FA sat = 92%  PA sat = 56%, 56%   08/14/12 S/P successful DC-CV of AF  10/26/12 S/P BiV-ICD implant  09/28/12 ECHO EF 15% 01/07/13 ECHO EF 20%, restrictive diastolic function, mildly decreased RV size and systolic fxn, mild to moderate MR, PA systolic pressure 59 mmHg   He returns for follow up with an interpreter.   Lat visit Milrinone was weaned to 0.25 mcg without difficulty. Denies SOB/PND/Orthopnea. No bleeding problems.  Excellent appetite. He is able to walk daily about 10-15 minutes.  He continues on home milrinone at 0.25 mcg/kg/min via PICC which is followed by [facility].   Weight at home 143-147 pounds.  He is walking outside 15-20 minutes at a time.  He is in sinus rhythm today.   NO ACE-I 2/2 rash  Labs (7/14): HCT 39.7, K 4.6, creatinine 1.2  ROS: All systems negative except as listed in HPI, PMH and Problem List.  Past Medical History  Diagnosis Date   . HTN (hypertension)   . High cholesterol   . CHF (congestive heart failure)   . BBBB (bilateral bundle branch block)   . LBBB (left bundle branch block)   . Weakness   . Tired   . Seizures 06/11/2012    new onset/notes (06/11/2012)  . SOB (shortness of breath)     "sometimes when I lay down;; related to not taking my RX" (06/11/2012)  . Myocardial infarction     06/10/2012  . Atrial fibrillation   . Stroke   . Atrial thrombus     left  . Pulmonary HTN     Current Outpatient Prescriptions  Medication Sig Dispense Refill  . carvedilol (COREG) 6.25 MG tablet Take 1 tablet (6.25 mg total) by mouth 2 (two) times daily with a meal.      . Choline Fenofibrate (TRILIPIX) 45 MG capsule Take 45 mg by mouth daily.      . digoxin (LANOXIN) 0.125 MG tablet Take 0.5 tablets (0.0625 mg total) by mouth daily.  15 tablet  3  . furosemide (LASIX) 40 MG tablet Take 40 mg by mouth 2 (two) times daily. Take extra tab in AM as needed for increased wt and swelling      . milrinone (PRIMACOR) 20 MG/100ML SOLN infusion Inject 16.875 mcg/min into the vein continuous.  100 mL    . potassium chloride SA (K-DUR,KLOR-CON) 20 MEQ tablet Take 20 mEq by mouth daily.      . simvastatin (ZOCOR) 20 MG tablet Take 40 mg by mouth daily.       Marland Kitchen spironolactone (ALDACTONE) 25 MG tablet Take 1 tablet (25 mg total) by mouth daily.  30 tablet  6  . Tadalafil, PAH, 20 MG TABS Take 2 tablets (40 mg total) by mouth daily.  60 tablet  6  . traMADol (ULTRAM) 50 MG tablet Take 1 tablet (50 mg total) by mouth 2 (two) times daily as needed for pain.  60 tablet  0  . warfarin (COUMADIN) 4 MG tablet Take 1 tablet (4 mg total) by mouth as directed.  35 tablet  3   No current facility-administered medications for this encounter.     PHYSICAL EXAM: Filed Vitals:   01/07/13 1005  BP: 100/56  Pulse: 75  Weight: 147 lb 6.4 oz (66.86 kg)  SpO2: 93%   General:  Elderly, thin male. NAD.Marland Kitchen No resp difficulty Interpreter  present HEENT: normal Neck: supple. JVP 6-7 Carotids 2+ bilaterally; no bruits. No lymphadenopathy or thryomegaly appreciated. Cor: PMI laterally displaced. RRR. No s3 or murmur. ICD site well-healed  Lungs: clear Abdomen: soft, nontender, nondistended. No hepatosplenomegaly. No bruits or masses. Good bowel sounds. Extremities: no cyanosis, clubbing, rash, extremity edema  RUE PICC - no discharge  Neuro: alert & orientedx3, cranial nerves grossly intact. Moves all 4 extremities w/o difficulty. Affect pleasant.  ASSESSMENT & PLAN: 1. Chronic systolic CHF: Nonischemic cardiomyopathy with EF 20%.  He has a CRT-D device. He is euvolemic on exam today with NYHA class II symptoms.  He has been on home milrinone.   - I will continue to try to wean down milrinone, decrease to 0.125 mcg/kg/min.   - Continue Coreg, digoxin, spironolactone at current doses.  - Since we are decreasing milrinone, will add afterload reduction with losartan 12.5 mg bid.   - Followup in 1 month, hopefully can stop milrinone.  After that, plan RHC.  - BMET and digoxin level in 2 wks.   - Followup in office in 1 month.  2. Paroxysmal atrial fibrillation: Patient remains in NSR.  He is on warfarin.  3. Pulmonary arterial hypertension: Patient is on tadalafil.  PA systolic pressure 59 mmHg today by echo.  Will be getting RHC once off milrinone.   Tyler Christensen 01/07/2013 2:52 PM

## 2013-01-07 NOTE — Progress Notes (Signed)
Interpreter Wyvonnia Dusky for Shirlee Latch MD

## 2013-01-09 DIAGNOSIS — I4891 Unspecified atrial fibrillation: Secondary | ICD-10-CM

## 2013-01-09 DIAGNOSIS — I5023 Acute on chronic systolic (congestive) heart failure: Secondary | ICD-10-CM

## 2013-01-09 DIAGNOSIS — I452 Bifascicular block: Secondary | ICD-10-CM

## 2013-01-09 DIAGNOSIS — Z48812 Encounter for surgical aftercare following surgery on the circulatory system: Secondary | ICD-10-CM

## 2013-01-09 DIAGNOSIS — A051 Botulism food poisoning: Secondary | ICD-10-CM

## 2013-01-15 ENCOUNTER — Ambulatory Visit (INDEPENDENT_AMBULATORY_CARE_PROVIDER_SITE_OTHER): Payer: Medicare Other | Admitting: Cardiology

## 2013-01-15 DIAGNOSIS — I4891 Unspecified atrial fibrillation: Secondary | ICD-10-CM

## 2013-01-15 LAB — POCT INR: INR: 2.1

## 2013-01-21 ENCOUNTER — Encounter: Payer: Self-pay | Admitting: Internal Medicine

## 2013-01-23 ENCOUNTER — Telehealth (HOSPITAL_COMMUNITY): Payer: Self-pay | Admitting: Adult Health

## 2013-01-23 NOTE — Telephone Encounter (Signed)
Attempted to leave message to stop digoxin. Will contact [facility] to and let them know to stop digoxin.  CLEGG,[name] 12:28 PM

## 2013-01-24 ENCOUNTER — Other Ambulatory Visit (HOSPITAL_COMMUNITY): Payer: Self-pay | Admitting: Cardiology

## 2013-01-24 DIAGNOSIS — E785 Hyperlipidemia, unspecified: Secondary | ICD-10-CM

## 2013-01-24 MED ORDER — SIMVASTATIN 20 MG PO TABS: 40.0000 mg | ORAL_TABLET | Freq: Every day | ORAL | Status: DC

## 2013-01-25 ENCOUNTER — Other Ambulatory Visit (HOSPITAL_COMMUNITY): Payer: Self-pay | Admitting: *Deleted

## 2013-01-25 DIAGNOSIS — E785 Hyperlipidemia, unspecified: Secondary | ICD-10-CM

## 2013-01-25 MED ORDER — SIMVASTATIN 20 MG PO TABS: 40.0000 mg | ORAL_TABLET | Freq: Every day | ORAL | Status: DC

## 2013-01-29 ENCOUNTER — Ambulatory Visit (INDEPENDENT_AMBULATORY_CARE_PROVIDER_SITE_OTHER): Payer: Medicare Other | Admitting: Pharmacist

## 2013-01-29 ENCOUNTER — Other Ambulatory Visit (HOSPITAL_COMMUNITY): Payer: Self-pay | Admitting: *Deleted

## 2013-01-29 DIAGNOSIS — I4891 Unspecified atrial fibrillation: Secondary | ICD-10-CM

## 2013-01-29 LAB — POCT INR: INR: 1.8

## 2013-01-29 MED ORDER — CHOLINE FENOFIBRATE 45 MG PO CPDR: 45.0000 mg | DELAYED_RELEASE_CAPSULE | Freq: Every day | ORAL | Status: DC

## 2013-01-30 ENCOUNTER — Ambulatory Visit (INDEPENDENT_AMBULATORY_CARE_PROVIDER_SITE_OTHER): Payer: Medicare Other | Admitting: Internal Medicine

## 2013-01-30 ENCOUNTER — Encounter: Payer: Self-pay | Admitting: Internal Medicine

## 2013-01-30 ENCOUNTER — Telehealth (HOSPITAL_COMMUNITY): Payer: Self-pay | Admitting: Cardiology

## 2013-01-30 VITALS — BP 113/65 | HR 79 | Ht 67.0 in | Wt 144.2 lb

## 2013-01-30 DIAGNOSIS — Z95810 Presence of automatic (implantable) cardiac defibrillator: Secondary | ICD-10-CM

## 2013-01-30 DIAGNOSIS — I5022 Chronic systolic (congestive) heart failure: Secondary | ICD-10-CM

## 2013-01-30 DIAGNOSIS — I509 Heart failure, unspecified: Secondary | ICD-10-CM

## 2013-01-30 LAB — ICD DEVICE OBSERVATION
BAMS-0001: 171 {beats}/min
LV LEAD IMPEDENCE ICD: 456 Ohm
LV LEAD THRESHOLD: 0.5 V
RV LEAD IMPEDENCE ICD: 399 Ohm
RV LEAD THRESHOLD: 0.75 V
TZON-0003ATACH: 350.8 ms

## 2013-01-30 NOTE — Telephone Encounter (Signed)
Tyler Christensen called to inform Tyler Christensen is only taking lasix once daily and will increase to BID if weight increases. His weight is stable on this dose. Still take Potassium daily Also pt states losartan potassium causes him to itch, unsure how pt figured out it was this particular med.

## 2013-01-30 NOTE — Assessment & Plan Note (Signed)
His symptoms are much improved s/p ionotropic support and BiV ICD. He will continue his current medications. Hopefully he is support be discontinued soon in his PICC line removed.

## 2013-01-30 NOTE — Progress Notes (Signed)
HPI [name] returns today for followup. He is a pleasant 79 yo man with severe chf, with severe LV dysfunction and LBBB who underwent insertion of a BiV ICD several months ago. In the interim, he has improved. He remains on IV ionotropic support but is scheduled to be seen in CHF clinic in a few weeks to see about weaning off of this medication. He denies chest pain. He has class 2a heart failure symptoms. No edema.  Allergies  Allergen Reactions  . Lisinopril Rash     Current Outpatient Prescriptions  Medication Sig Dispense Refill  . carvedilol (COREG) 6.25 MG tablet Take 1 tablet (6.25 mg total) by mouth 2 (two) times daily with a meal.      . furosemide (LASIX) 40 MG tablet Take 40 mg by mouth 2 (two) times daily. Take extra tab in AM as needed for increased wt and swelling      . loratadine (CLARITIN) 10 MG tablet Take 10 mg by mouth daily.      Marland Kitchen losartan (COZAAR) 25 MG tablet Take 0.5 tablets (12.5 mg total) by mouth 2 (two) times daily.  30 tablet  3  . potassium chloride SA (K-DUR,KLOR-CON) 20 MEQ tablet Take 20 mEq by mouth daily.      . simvastatin (ZOCOR) 20 MG tablet Take 2 tablets (40 mg total) by mouth daily.  60 tablet  6  . spironolactone (ALDACTONE) 25 MG tablet Take 1 tablet (25 mg total) by mouth daily.  30 tablet  6  . Tadalafil, PAH, 20 MG TABS Take 2 tablets (40 mg total) by mouth daily.  60 tablet  6  . traMADol (ULTRAM) 50 MG tablet Take 1 tablet (50 mg total) by mouth 2 (two) times daily as needed for pain.  60 tablet  0  . warfarin (COUMADIN) 4 MG tablet Take 1 tablet (4 mg total) by mouth as directed.  35 tablet  3   No current facility-administered medications for this visit.     Past Medical History  Diagnosis Date  . HTN (hypertension)   . High cholesterol   . CHF (congestive heart failure)   . BBBB (bilateral bundle branch block)   . LBBB (left bundle branch block)   . Weakness   . Tired   . Seizures 06/11/2012    new onset/notes (06/11/2012)  .  SOB (shortness of breath)     "sometimes when I lay down;; related to not taking my RX" (06/11/2012)  . Myocardial infarction     06/10/2012  . Atrial fibrillation   . Stroke   . Atrial thrombus     left  . Pulmonary HTN     ROS:   All systems reviewed and negative except as noted in the HPI.   Past Surgical History  Procedure Laterality Date  . Throat surgery  1942    "and nose" (06/11/2012); ?T&A  . Inguinal hernia repair  ~ 2008    "both sides" (06/11/2012)  . Tee without cardioversion  06/29/2012    Procedure: TRANSESOPHAGEAL ECHOCARDIOGRAM (TEE);  Surgeon: Dolores Patty, MD;  Location: [facility] ENDOSCOPY;  Service: Cardiovascular;  Laterality: N/A;  will need a spanish interpreter Interpreter will be here at 1330-Hope spoke w/ Lawana  . Cardioversion N/A 08/14/2012    Procedure: CARDIOVERSION;  Surgeon: Dolores Patty, MD;  Location: [facility] ENDOSCOPY;  Service: Cardiovascular;  Laterality: N/A;     No family history on file.   History   Social History  . Marital Status:  Divorced    Spouse Name: N/A    Number of Children: N/A  . Years of Education: N/A   Occupational History  . Not on file.   Social History Main Topics  . Smoking status: Never Smoker   . Smokeless tobacco: Never Used  . Alcohol Use: No  . Drug Use: No  . Sexual Activity: No   Other Topics Concern  . Not on file   Social History Narrative  . No narrative on file     BP 113/65  Pulse 79  Ht 5\' 7"  (1.702 m)  Wt 144 lb 3.2 oz (65.409 kg)  BMI 22.58 kg/m2  Physical Exam:  Well appearing 79 yo man, NAD HEENT: Unremarkable Neck:  6 cm JVD, no thyromegally Back:  No CVA tenderness Lungs:  Clear with no wheezes, rales, or rhonchi HEART:  Regular rate rhythm, no murmurs, no rubs, no clicks Abd:  soft, positive bowel sounds, no organomegally, no rebound, no guarding Ext:  2 plus pulses, no edema, no cyanosis, no clubbing Skin:  No rashes no nodules Neuro:  CN II through XII intact,  motor grossly intact  DEVICE  Normal device function.  See PaceArt for details.   Assess/Plan:

## 2013-01-30 NOTE — Assessment & Plan Note (Signed)
His biventricular ICD is working normally with satisfactory pacing thresholds. His fluid index is stable.

## 2013-01-30 NOTE — Patient Instructions (Addendum)
Your physician wants you to follow-up in: 9 months with Dr Taylor.  You will receive a reminder letter in the mail two months in advance. If you don't receive a letter, please call our office to schedule the follow-up appointment.  

## 2013-02-01 ENCOUNTER — Encounter: Payer: Self-pay | Admitting: Internal Medicine

## 2013-02-01 NOTE — Telephone Encounter (Signed)
Left mess with [name] to call back regarding itching

## 2013-02-06 ENCOUNTER — Encounter: Payer: Self-pay | Admitting: Internal Medicine

## 2013-02-12 ENCOUNTER — Encounter: Payer: Self-pay | Admitting: Internal Medicine

## 2013-02-13 ENCOUNTER — Ambulatory Visit (INDEPENDENT_AMBULATORY_CARE_PROVIDER_SITE_OTHER): Payer: Medicare Other | Admitting: Pharmacist

## 2013-02-13 DIAGNOSIS — I4891 Unspecified atrial fibrillation: Secondary | ICD-10-CM

## 2013-02-14 ENCOUNTER — Ambulatory Visit (HOSPITAL_COMMUNITY)
Admission: RE | Admit: 2013-02-14 | Discharge: 2013-02-14 | Disposition: A | Discharge status: Home / Self Care | Payer: Medicare Other | Source: Ambulatory Visit | Attending: Internal Medicine | Admitting: Internal Medicine

## 2013-02-14 ENCOUNTER — Encounter (HOSPITAL_COMMUNITY): Payer: Self-pay

## 2013-02-14 ENCOUNTER — Encounter (HOSPITAL_COMMUNITY): Payer: Self-pay | Admitting: *Deleted

## 2013-02-14 ENCOUNTER — Other Ambulatory Visit (HOSPITAL_COMMUNITY): Payer: Self-pay | Admitting: Anesthesiology

## 2013-02-14 VITALS — BP 100/60 | HR 72 | Wt 149.4 lb

## 2013-02-14 DIAGNOSIS — I4891 Unspecified atrial fibrillation: Secondary | ICD-10-CM | POA: Insufficient documentation

## 2013-02-14 DIAGNOSIS — I10 Essential (primary) hypertension: Secondary | ICD-10-CM | POA: Insufficient documentation

## 2013-02-14 DIAGNOSIS — I2721 Secondary pulmonary arterial hypertension: Secondary | ICD-10-CM

## 2013-02-14 DIAGNOSIS — I509 Heart failure, unspecified: Secondary | ICD-10-CM | POA: Insufficient documentation

## 2013-02-14 DIAGNOSIS — I5022 Chronic systolic (congestive) heart failure: Secondary | ICD-10-CM

## 2013-02-14 DIAGNOSIS — I428 Other cardiomyopathies: Secondary | ICD-10-CM | POA: Insufficient documentation

## 2013-02-14 DIAGNOSIS — E785 Hyperlipidemia, unspecified: Secondary | ICD-10-CM | POA: Insufficient documentation

## 2013-02-14 DIAGNOSIS — I252 Old myocardial infarction: Secondary | ICD-10-CM | POA: Insufficient documentation

## 2013-02-14 DIAGNOSIS — Z79899 Other long term (current) drug therapy: Secondary | ICD-10-CM | POA: Insufficient documentation

## 2013-02-14 DIAGNOSIS — I447 Left bundle-branch block, unspecified: Secondary | ICD-10-CM | POA: Insufficient documentation

## 2013-02-14 DIAGNOSIS — Z8673 Personal history of transient ischemic attack (TIA), and cerebral infarction without residual deficits: Secondary | ICD-10-CM | POA: Insufficient documentation

## 2013-02-14 DIAGNOSIS — Z7901 Long term (current) use of anticoagulants: Secondary | ICD-10-CM | POA: Insufficient documentation

## 2013-02-14 DIAGNOSIS — I2789 Other specified pulmonary heart diseases: Secondary | ICD-10-CM

## 2013-02-14 MED ORDER — SPIRONOLACTONE 25 MG PO TABS: 25.0000 mg | ORAL_TABLET | Freq: Every day | ORAL | Status: DC

## 2013-02-14 NOTE — Progress Notes (Signed)
Patient ID: Tyler Christensen, male   DOB: 1932-10-11, 79 y.o.   MRN: [id number]  HPI: Tyler Christensen is a 79 y.o. [name] (non-english speaking) w/ PMHx significant for chronic systolic CHF (EF 10%), NICM, LBBB, PAF (on coumadin), HTN, HLD, CVA, Pulmonary Hypertension and h/o seizure d/o (2/2 CVA 05/2012).   Admitted to [facility] 06/2012 with acute decompensated heart failure. As noted below diagnosed with severe pulmonary hypertension from initial RHC and placed on Milrinone and Revatio (eventually transitioned to tadalafil 40). VQ low probability for pulmonary embolus. Repeat RHC on 07/06/12 with markedly improved PVR and Cardiac Index. Discharged on Milrinone at 0.375 mcg via PICC. Discharge Weight 146 pounds   07/02/12 RHC  RA = 15  RV = 94/10/13  PA = 93/42 (59)  PCW = 15  Fick cardiac output/index = 1.9/1.1  PVR = 23.0 Woods  SVR = 3140  FA sat = 95%  PA sat = 31%, 32%  Ao Pressure: 121/60 (84)  LV Pressure: 115/12/14   07/06/12 RHC  RA = 9  RV = 74/1/7  PA = 75/32 (47)  PCW = 34  Fick cardiac output/index = 3.9/2.1  PVR = 3.3 Woods  FA sat = 92%  PA sat = 56%, 56%   08/14/12 S/P successful DC-CV of AF  10/26/12 S/P BiV-ICD implant  09/28/12 ECHO EF 15% 01/07/13 ECHO EF 20%, restrictive diastolic function, mildly decreased RV size and systolic fxn, mild to moderate MR, PA systolic pressure 59 mmHg   He returns for follow up with an interpreter.  Last visit Milrinone was weaned to 0.125 mcg without difficulty. Denies SOB/PND/Orthopnea. No bleeding problems.  Excellent appetite.  Weight at home 143-147 pounds.  He is walking outside 15-20 minutes at a time.    NO ACE-I 2/2 rash  Labs (7/14): HCT 39.7, K 4.6, creatinine 1.2 Labs (8/26) HCT 37.7 K 4.3 Creatinine 1.6   ROS: All systems negative except as listed in HPI, PMH and Problem List.  Past Medical History  Diagnosis Date  . HTN (hypertension)   . High cholesterol   . CHF (congestive heart failure)   . BBBB (bilateral bundle branch  block)   . LBBB (left bundle branch block)   . Weakness   . Tired   . Seizures 06/11/2012    new onset/notes (06/11/2012)  . SOB (shortness of breath)     "sometimes when I lay down;; related to not taking my RX" (06/11/2012)  . Myocardial infarction     06/10/2012  . Atrial fibrillation   . Stroke   . Atrial thrombus     left  . Pulmonary HTN     Current Outpatient Prescriptions  Medication Sig Dispense Refill  . carvedilol (COREG) 6.25 MG tablet Take 1 tablet (6.25 mg total) by mouth 2 (two) times daily with a meal.      . Choline Fenofibrate (FENOFIBRIC ACID) 45 MG CPDR Take 1 tablet by mouth daily.      . furosemide (LASIX) 40 MG tablet Take 40 mg by mouth 2 (two) times daily. Take extra tab in AM as needed for increased wt and swelling      . loratadine (CLARITIN) 10 MG tablet Take 10 mg by mouth daily.      Marland Kitchen losartan (COZAAR) 25 MG tablet Take 0.5 tablets (12.5 mg total) by mouth 2 (two) times daily.  30 tablet  3  . potassium chloride SA (K-DUR,KLOR-CON) 20 MEQ tablet Take 20 mEq by mouth daily.      Marland Kitchen  simvastatin (ZOCOR) 40 MG tablet Take 40 mg by mouth every evening.      . sodium chloride 0.9 % SOLN with milrinone 1 MG/ML SOLN 200 mcg/mL Inject 0.25 mcg/kg/min into the vein continuous.      . Tadalafil, PAH, 20 MG TABS Take 2 tablets (40 mg total) by mouth daily.  60 tablet  6  . traMADol (ULTRAM) 50 MG tablet Take 1 tablet (50 mg total) by mouth 2 (two) times daily as needed for pain.  60 tablet  0  . warfarin (COUMADIN) 4 MG tablet Take 1 tablet (4 mg total) by mouth as directed.  35 tablet  3  . spironolactone (ALDACTONE) 25 MG tablet Take 1 tablet (25 mg total) by mouth daily.  30 tablet  6   No current facility-administered medications for this encounter.     PHYSICAL EXAM: Filed Vitals:   02/14/13 1056  BP: 100/60  Pulse: 72  Weight: 149 lb 6.4 oz (67.767 kg)  SpO2: 96%   General:  Elderly, thin male. NAD.Marland Kitchen No resp difficulty Interpreter present HEENT:  normal Neck: supple. JVP 7 Carotids 2+ bilaterally; no bruits. No lymphadenopathy or thryomegaly appreciated. Cor: PMI laterally displaced. RRR. No s3 or murmur. ICD site well-healed  Lungs: clear Abdomen: soft, nontender, nondistended. No hepatosplenomegaly. No bruits or masses. Good bowel sounds. Extremities: no cyanosis, clubbing, rash, extremity edema  RUE PICC - no discharge  Neuro: alert & orientedx3, cranial nerves grossly intact. Moves all 4 extremities w/o difficulty. Affect pleasant.  ASSESSMENT & PLAN: 1. Chronic systolic CHF: Nonischemic cardiomyopathy with EF 20%.  He has a CRT-D device. Volume status stable. NYHA class II symptoms.   - He has tolerated Milrinone reduction to 0.125 mcg. Will stop Milrinone today  Will repeat RHC next week to assess cardiac output and PA pressure.  - Continue Coreg, digoxin, losartan, spironolactone at current doses. Reorder spironolactone today.  - Will not titrate up Coreg with soft blood pressure or losartan with soft blood pressure and rise in creatinine.   2. Paroxysmal atrial fibrillation: Patient remains in NSR.  He is on warfarin.   3. Pulmonary arterial hypertension:  Continue adcirca 40 mg daily.  PA systolic pressure 59 mmHg today by echo.  Will be getting RHC next week off milrinone to reassess.    4. CKD: Mild rise in creatinine with lower milrinone and on losartan.  Will need to repeat BMET and digoxin level early next week off milrinone.    Follow up in 2 weeks  CLEGG,[name] 02/14/2013 11:02 AM  Patient seen with PA, agree with the above note.  Symptomatically, patient is doing well on lower dose of milrinone.  He does not appear volume overloaded.  However, his creatinine has risen with decreasing milrinone and adding losartan.  We are going to try stopping milrinone today.  Will get BMET again early next week.  No changes to other medications.  I will arrange for RHC next week to reassess filling pressures, PA pressure, and cardiac  output.  Will leave PICC in place in case we decide to restart milrinone. Continue tadalafil for PAH.   Marca Ancona 02/14/2013 11:24 AM

## 2013-02-15 ENCOUNTER — Telehealth (HOSPITAL_COMMUNITY): Payer: Self-pay | Admitting: Cardiology

## 2013-02-15 NOTE — Telephone Encounter (Signed)
[name] called to clairfy if pt truly needs to have dig level drawn since pt has not been taking digoxin. Dig was d/c'd two weeks ago when levels returned high.   Per vo Tonye Becket, NP Ok to void order  To draw labs.   [name] aware

## 2013-02-21 ENCOUNTER — Encounter (HOSPITAL_BASED_OUTPATIENT_CLINIC_OR_DEPARTMENT_OTHER): Payer: Self-pay | Admitting: *Deleted

## 2013-02-21 ENCOUNTER — Inpatient Hospital Stay (HOSPITAL_BASED_OUTPATIENT_CLINIC_OR_DEPARTMENT_OTHER)
Admission: RE | Admit: 2013-02-21 | Discharge: 2013-02-21 | Disposition: A | Discharge status: Still In Hospital | Payer: Medicare Other | Source: Ambulatory Visit | Attending: Internal Medicine | Admitting: Internal Medicine

## 2013-02-21 ENCOUNTER — Inpatient Hospital Stay (HOSPITAL_BASED_OUTPATIENT_CLINIC_OR_DEPARTMENT_OTHER)
Admission: RE | Admit: 2013-02-21 | Discharge: 2013-02-21 | Disposition: A | Discharge status: Home / Self Care | Payer: Medicare Other | Source: Ambulatory Visit | Attending: Internal Medicine | Admitting: Internal Medicine

## 2013-02-21 ENCOUNTER — Encounter (HOSPITAL_BASED_OUTPATIENT_CLINIC_OR_DEPARTMENT_OTHER)
Admission: RE | Disposition: A | Discharge status: Still In Hospital | Payer: Self-pay | Source: Ambulatory Visit | Attending: Internal Medicine

## 2013-02-21 ENCOUNTER — Ambulatory Visit (HOSPITAL_BASED_OUTPATIENT_CLINIC_OR_DEPARTMENT_OTHER)
Admission: RE | Admit: 2013-02-21 | Discharge: 2013-02-21 | Disposition: A | Discharge status: Home / Self Care | Payer: Medicare Other | Source: Ambulatory Visit | Attending: Internal Medicine | Admitting: Internal Medicine

## 2013-02-21 ENCOUNTER — Encounter (HOSPITAL_BASED_OUTPATIENT_CLINIC_OR_DEPARTMENT_OTHER)
Admission: RE | Disposition: A | Discharge status: Home / Self Care | Payer: Self-pay | Source: Ambulatory Visit | Attending: Internal Medicine

## 2013-02-21 ENCOUNTER — Ambulatory Visit (HOSPITAL_COMMUNITY)
Admission: RE | Admit: 2013-02-21 | Discharge: 2013-02-21 | Disposition: A | Discharge status: Home / Self Care | Payer: Medicare Other | Source: Ambulatory Visit | Attending: Cardiology | Admitting: Cardiology

## 2013-02-21 VITALS — BP 92/68 | HR 79 | Wt 148.5 lb

## 2013-02-21 DIAGNOSIS — Z79899 Other long term (current) drug therapy: Secondary | ICD-10-CM | POA: Insufficient documentation

## 2013-02-21 DIAGNOSIS — E785 Hyperlipidemia, unspecified: Secondary | ICD-10-CM | POA: Insufficient documentation

## 2013-02-21 DIAGNOSIS — I4891 Unspecified atrial fibrillation: Secondary | ICD-10-CM | POA: Insufficient documentation

## 2013-02-21 DIAGNOSIS — Z7901 Long term (current) use of anticoagulants: Secondary | ICD-10-CM | POA: Insufficient documentation

## 2013-02-21 DIAGNOSIS — I129 Hypertensive chronic kidney disease with stage 1 through stage 4 chronic kidney disease, or unspecified chronic kidney disease: Secondary | ICD-10-CM | POA: Insufficient documentation

## 2013-02-21 DIAGNOSIS — I428 Other cardiomyopathies: Secondary | ICD-10-CM | POA: Insufficient documentation

## 2013-02-21 DIAGNOSIS — I2789 Other specified pulmonary heart diseases: Secondary | ICD-10-CM

## 2013-02-21 DIAGNOSIS — I2721 Secondary pulmonary arterial hypertension: Secondary | ICD-10-CM

## 2013-02-21 DIAGNOSIS — I5022 Chronic systolic (congestive) heart failure: Secondary | ICD-10-CM | POA: Insufficient documentation

## 2013-02-21 DIAGNOSIS — I509 Heart failure, unspecified: Secondary | ICD-10-CM

## 2013-02-21 DIAGNOSIS — I5023 Acute on chronic systolic (congestive) heart failure: Secondary | ICD-10-CM | POA: Insufficient documentation

## 2013-02-21 DIAGNOSIS — I252 Old myocardial infarction: Secondary | ICD-10-CM | POA: Insufficient documentation

## 2013-02-21 DIAGNOSIS — N189 Chronic kidney disease, unspecified: Secondary | ICD-10-CM | POA: Insufficient documentation

## 2013-02-21 DIAGNOSIS — I452 Bifascicular block: Secondary | ICD-10-CM | POA: Insufficient documentation

## 2013-02-21 DIAGNOSIS — I447 Left bundle-branch block, unspecified: Secondary | ICD-10-CM | POA: Insufficient documentation

## 2013-02-21 DIAGNOSIS — Z8673 Personal history of transient ischemic attack (TIA), and cerebral infarction without residual deficits: Secondary | ICD-10-CM | POA: Insufficient documentation

## 2013-02-21 DIAGNOSIS — I5189 Other ill-defined heart diseases: Secondary | ICD-10-CM | POA: Insufficient documentation

## 2013-02-21 DIAGNOSIS — I10 Essential (primary) hypertension: Secondary | ICD-10-CM | POA: Insufficient documentation

## 2013-02-21 DIAGNOSIS — E7800 Pure hypercholesterolemia, unspecified: Secondary | ICD-10-CM | POA: Insufficient documentation

## 2013-02-21 LAB — POCT I-STAT 3, VENOUS BLOOD GAS (G3P V)
Acid-Base Excess: 3 mmol/L — ABNORMAL HIGH (ref 0.0–2.0)
Bicarbonate: 28.9 mEq/L — ABNORMAL HIGH (ref 20.0–24.0)
TCO2: 30 mmol/L (ref 0–100)
pCO2, Ven: 46.3 mmHg (ref 45.0–50.0)
pCO2, Ven: 47 mmHg (ref 45.0–50.0)
pH, Ven: 7.397 — ABNORMAL HIGH (ref 7.250–7.300)
pO2, Ven: 28 mmHg — CL (ref 30.0–45.0)

## 2013-02-21 LAB — POCT I-STAT 3, ART BLOOD GAS (G3+): Acid-Base Excess: 4 mmol/L — ABNORMAL HIGH (ref 0.0–2.0)

## 2013-02-21 SURGERY — JV RIGHT HEART CATHETERIZATION
Anesthesia: Moderate Sedation

## 2013-02-21 SURGERY — JV RIGHT HEART CATHETERIZATION

## 2013-02-21 MED ORDER — SODIUM CHLORIDE 0.9 % IJ SOLN: 3.0000 mL | Freq: Two times a day (BID) | INTRAMUSCULAR | Status: DC

## 2013-02-21 MED ORDER — SODIUM CHLORIDE 0.9 % IV SOLN: 250.0000 mL | INTRAVENOUS | Status: DC | PRN

## 2013-02-21 MED ORDER — ONDANSETRON HCL 4 MG/2ML IJ SOLN: 4.0000 mg | Freq: Four times a day (QID) | INTRAMUSCULAR | Status: DC | PRN

## 2013-02-21 MED ORDER — ACETAMINOPHEN 325 MG PO TABS: 650.0000 mg | ORAL_TABLET | ORAL | Status: DC | PRN

## 2013-02-21 MED ORDER — SODIUM CHLORIDE 0.9 % IV SOLN
250.0000 mL | INTRAVENOUS | Status: DC | PRN
  Administered 2013-02-21: 75 mL/h via INTRAVENOUS

## 2013-02-21 MED ORDER — SODIUM CHLORIDE 0.9 % IJ SOLN: 3.0000 mL | INTRAMUSCULAR | Status: DC | PRN

## 2013-02-21 NOTE — Progress Notes (Signed)
EKG completed.  Pt to [facility] cath Lab for procedure via stretcher

## 2013-02-21 NOTE — CV Procedure (Signed)
Cardiac Cath Procedure Note:  Indication:  Worsening HF  Procedures performed:  1) Right heart catheterization  Description of procedure:   The risks and indication of the procedure were explained. Consent was signed and placed on the chart. An appropriate timeout was taken prior to the procedure. The right groin was prepped and draped in the routine sterile fashion and anesthetized with 1% local lidocaine.   A 7 FR venous sheath was placed in the right femoral vein using a modified Seldinger technique. A standard Swan-Ganz catheter was used for the procedure.   Complications: None apparent.  Findings:  RA = 9 with v-waves to 20  RV = 72/8/20 PA = 69/27 (44) PCW = 18-20 Fick cardiac output/index = 2.7/1.5 PVR = 9.6 woods FA sat = 96% PA sat = 48%, 52%  Assessment/Plan:  Main issue now is recurrent pulmonary HTN and low output. This responded very well to IV milrinone in the past. Will continue Adcirca and restart milrinone. Will need to review echo to look more closely at TV. May consider using an ERA as well.    Georgi Navarrete 1:51 PM

## 2013-02-21 NOTE — H&P (View-Only) (Signed)
Patient ID: Tyler Christensen, male   DOB: 05/29/1933, 80 y.o.   MRN: [id number]  HPI: Mr. Sampedro is a 80 y.o. [place] male (non-english speaking) w/ PMHx significant for chronic systolic CHF (EF 20%), NICM, LBBB, PAF (on coumadin), HTN, HLD, CVA, Pulmonary Hypertension and h/o seizure d/o (2/2 CVA 05/2012).   Admitted to [facility] 06/2012 with acute decompensated heart failure. As noted below diagnosed with severe pulmonary hypertension from initial RHC and placed on Milrinone and Revatio (eventually transitioned to tadalafil 40). VQ low probability for pulmonary embolus. Repeat RHC on 07/06/12 with markedly improved PVR and Cardiac Index. Discharged on Milrinone at 0.375 mcg via PICC. Discharge Weight 146 pounds   07/02/12 RHC  RA = 15  RV = 94/10/13  PA = 93/42 (59)  PCW = 15  Fick cardiac output/index = 1.9/1.1  PVR = 23.0 Woods  SVR = 3140  FA sat = 95%  PA sat = 31%, 32%  Ao Pressure: 121/60 (84)  LV Pressure: 115/12/14   07/06/12 RHC  RA = 9  RV = 74/1/7  PA = 75/32 (47)  PCW = 34  Fick cardiac output/index = 3.9/2.1  PVR = 3.3 Woods  FA sat = 92%  PA sat = 56%, 56%   08/14/12 S/P successful DC-CV of AF  10/26/12 S/P BiV-ICD implant  09/28/12 ECHO EF 15% 01/07/13 ECHO EF 20%, restrictive diastolic function, mildly decreased RV size and systolic fxn, mild to moderate MR, PA systolic pressure 59 mmHg   He returns for follow up with an interpreter.  Last visit Milrinone was weaned to 0.125 mcg without difficulty. Denies SOB/PND/Orthopnea. No bleeding problems.  Excellent appetite.  Weight at home 143-147 pounds.  He is walking outside 15-20 minutes at a time.    NO ACE-I 2/2 rash  Labs (7/14): HCT 39.7, K 4.6, creatinine 1.2 Labs (8/26) HCT 37.7 K 4.3 Creatinine 1.6   ROS: All systems negative except as listed in HPI, PMH and Problem List.  Past Medical History  Diagnosis Date  . HTN (hypertension)   . High cholesterol   . CHF (congestive heart failure)   . BBBB (bilateral bundle branch  block)   . LBBB (left bundle branch block)   . Weakness   . Tired   . Seizures 06/11/2012    new onset/notes (06/11/2012)  . SOB (shortness of breath)     "sometimes when I lay down;; related to not taking my RX" (06/11/2012)  . Myocardial infarction     06/10/2012  . Atrial fibrillation   . Stroke   . Atrial thrombus     left  . Pulmonary HTN     Current Outpatient Prescriptions  Medication Sig Dispense Refill  . carvedilol (COREG) 6.25 MG tablet Take 1 tablet (6.25 mg total) by mouth 2 (two) times daily with a meal.      . Choline Fenofibrate (FENOFIBRIC ACID) 45 MG CPDR Take 1 tablet by mouth daily.      . furosemide (LASIX) 40 MG tablet Take 40 mg by mouth 2 (two) times daily. Take extra tab in AM as needed for increased wt and swelling      . loratadine (CLARITIN) 10 MG tablet Take 10 mg by mouth daily.      . losartan (COZAAR) 25 MG tablet Take 0.5 tablets (12.5 mg total) by mouth 2 (two) times daily.  30 tablet  3  . potassium chloride SA (K-DUR,KLOR-CON) 20 MEQ tablet Take 20 mEq by mouth daily.      .   simvastatin (ZOCOR) 40 MG tablet Take 40 mg by mouth every evening.      . sodium chloride 0.9 % SOLN with milrinone 1 MG/ML SOLN 200 mcg/mL Inject 0.25 mcg/kg/min into the vein continuous.      . Tadalafil, PAH, 20 MG TABS Take 2 tablets (40 mg total) by mouth daily.  60 tablet  6  . traMADol (ULTRAM) 50 MG tablet Take 1 tablet (50 mg total) by mouth 2 (two) times daily as needed for pain.  60 tablet  0  . warfarin (COUMADIN) 4 MG tablet Take 1 tablet (4 mg total) by mouth as directed.  35 tablet  3  . spironolactone (ALDACTONE) 25 MG tablet Take 1 tablet (25 mg total) by mouth daily.  30 tablet  6   No current facility-administered medications for this encounter.     PHYSICAL EXAM: Filed Vitals:   02/14/13 1056  BP: 100/60  Pulse: 72  Weight: 149 lb 6.4 oz (67.767 kg)  SpO2: 96%   General:  Elderly, thin male. NAD.. No resp difficulty Interpreter present HEENT:  normal Neck: supple. JVP 7 Carotids 2+ bilaterally; no bruits. No lymphadenopathy or thryomegaly appreciated. Cor: PMI laterally displaced. RRR. No s3 or murmur. ICD site well-healed  Lungs: clear Abdomen: soft, nontender, nondistended. No hepatosplenomegaly. No bruits or masses. Good bowel sounds. Extremities: no cyanosis, clubbing, rash, extremity edema  RUE PICC - no discharge  Neuro: alert & orientedx3, cranial nerves grossly intact. Moves all 4 extremities w/o difficulty. Affect pleasant.  ASSESSMENT & PLAN: 1. Chronic systolic CHF: Nonischemic cardiomyopathy with EF 20%.  He has a CRT-D device. Volume status stable. NYHA class II symptoms.   - He has tolerated Milrinone reduction to 0.125 mcg. Will stop Milrinone today  Will repeat RHC next week to assess cardiac output and PA pressure.  - Continue Coreg, digoxin, losartan, spironolactone at current doses. Reorder spironolactone today.  - Will not titrate up Coreg with soft blood pressure or losartan with soft blood pressure and rise in creatinine.   2. Paroxysmal atrial fibrillation: Patient remains in NSR.  He is on warfarin.   3. Pulmonary arterial hypertension:  Continue adcirca 40 mg daily.  PA systolic pressure 59 mmHg today by echo.  Will be getting RHC next week off milrinone to reassess.    4. CKD: Mild rise in creatinine with lower milrinone and on losartan.  Will need to repeat BMET and digoxin level early next week off milrinone.    Follow up in 2 weeks  CLEGG,[name] 02/14/2013 11:02 AM  Patient seen with PA, agree with the above note.  Symptomatically, patient is doing well on lower dose of milrinone.  He does not appear volume overloaded.  However, his creatinine has risen with decreasing milrinone and adding losartan.  We are going to try stopping milrinone today.  Will get BMET again early next week.  No changes to other medications.  I will arrange for RHC next week to reassess filling pressures, PA pressure, and cardiac  output.  Will leave PICC in place in case we decide to restart milrinone. Continue tadalafil for PAH.   Lasharn Bufkin 02/14/2013 11:24 AM   

## 2013-02-21 NOTE — Progress Notes (Signed)
NS started  Via PICC line with sterile technique infusing at 75cc/hr.  No problem at site.  Dr D Bensimhon in to talk pt with the interperter at bedside.  Pt verbalized understanding.

## 2013-02-21 NOTE — Interval H&P Note (Signed)
History and Physical Interval Note:  02/21/2013 1:51 PM  Tyler Christensen  has presented today for surgery, with the diagnosis of HF  The various methods of treatment have been discussed with the patient and family. After consideration of risks, benefits and other options for treatment, the patient has consented to  Procedure(s): JV RIGHT HEART CATHETERIZATION (N/A) as a surgical intervention .  The patient's history has been reviewed, patient examined, no change in status, stable for surgery.  I have reviewed the patient's chart and labs.  Questions were answered to the patient's satisfaction.     Daniel Bensimhon

## 2013-02-21 NOTE — Progress Notes (Signed)
Picc line in left upper arm intact and infusing without any problem at site.

## 2013-02-21 NOTE — Progress Notes (Signed)
Patient ID: Tyler Christensen, male   DOB: [birth year], 79 y.o.   MRN: [id number]  Tyler Christensen walked into the HF clinic today with dyspnea on exertion for the last 24 hours.   Discussed with Dr Gala Romney will need RHC today and possible admit for inotropes.   Tyler Christensen 1:16 PM

## 2013-02-21 NOTE — Progress Notes (Signed)
Pt received from procedure with interperter at bedside.  Dr Gala Romney in to talk to pt and discharge information given to pt via the interperter.  Pt able to verbalize understanding.  Pt able to tolerate fluids.

## 2013-02-22 ENCOUNTER — Encounter: Payer: Self-pay | Admitting: Internal Medicine

## 2013-02-22 ENCOUNTER — Telehealth: Payer: Self-pay | Admitting: *Deleted

## 2013-02-22 ENCOUNTER — Telehealth (HOSPITAL_COMMUNITY): Payer: Self-pay | Admitting: Cardiology

## 2013-02-22 NOTE — Telephone Encounter (Signed)
[name] called with concerns about pts weight creeping up. Today weight is 147. Normally pts weight is below 144. Standing order to take and extra Lasix if weight is greater than 144.  [name] wanted to verify if this was still an valid order as the pt had a cath on 9/4. Also can he restart his Coumadin?  Per VO amy Clegg, NP Ok for pt to have an extra Lasix and resume Coumadin as normal  [name],RN aware Will call pt over the weekend and do a home visit, Labs,and  dressing change on 9/8

## 2013-02-22 NOTE — Telephone Encounter (Signed)
[name] [facility] nurse called from pt home and he has been off coumadin since Tuesday. Had cath yesterday with Dr Gala Romney. Pt put back on Milrinone IV. Thus, pt instructed to take 4mg s today and resume normal dose until INR check on Tuesday.

## 2013-02-24 NOTE — Progress Notes (Signed)
Patient ID: Tyler Christensen, male   DOB: August 26, 1932, 79 y.o.   MRN: [id number]  HPI: Tyler Christensen is a 79 y.o. [name] (non-english speaking) w/ PMHx significant for chronic systolic CHF (EF 02%), NICM, LBBB, PAF (on coumadin), HTN, HLD, CVA, Pulmonary Hypertension and h/o seizure d/o (2/2 CVA 05/2012).   Admitted to [facility] 06/2012 with acute decompensated heart failure. As noted below diagnosed with severe pulmonary hypertension from initial RHC and placed on Milrinone and Revatio (eventually transitioned to tadalafil 40). VQ low probability for pulmonary embolus. Repeat RHC on 07/06/12 with markedly improved PVR and Cardiac Index. Discharged on Milrinone at 0.375 mcg via PICC. Discharge Weight 146 pounds   07/02/12 RHC  RA = 15  RV = 94/10/13  PA = 93/42 (59)  PCW = 15  Fick cardiac output/index = 1.9/1.1  PVR = 23.0 Woods  SVR = 3140  FA sat = 95%  PA sat = 31%, 32%  Ao Pressure: 121/60 (84)  LV Pressure: 115/12/14   07/06/12 RHC  RA = 9  RV = 74/1/7  PA = 75/32 (47)  PCW = 34  Fick cardiac output/index = 3.9/2.1  PVR = 3.3 Woods  FA sat = 92%  PA sat = 56%, 56%   08/14/12 S/P successful DC-CV of AF  10/26/12 S/P BiV-ICD implant  09/28/12 ECHO EF 15% 01/07/13 ECHO EF 20%, restrictive diastolic function, mildly decreased RV size and systolic fxn, mild to moderate MR, PA systolic pressure 59 mmHg   He returns for unscheduled follow up. Interpreter called.  Last visit Milrinone was weaned to off. Returns today c/o worsening HF symptoms. Very fatigued and SOB with minimal exertion. No ICD firing. Unable to keep up with his walking program. + orthopnea.  NO ACE-I 2/2 rash  Labs (7/14): HCT 39.7, K 4.6, creatinine 1.2 Labs (8/26) HCT 37.7 K 4.3 Creatinine 1.6   ROS: All systems negative except as listed in HPI, PMH and Problem List.  Past Medical History  Diagnosis Date  . HTN (hypertension)   . High cholesterol   . CHF (congestive heart failure)   . BBBB (bilateral bundle branch block)    . LBBB (left bundle branch block)   . Weakness   . Tired   . Seizures 06/11/2012    new onset/notes (06/11/2012)  . SOB (shortness of breath)     "sometimes when I lay down;; related to not taking my RX" (06/11/2012)  . Myocardial infarction     06/10/2012  . Atrial fibrillation   . Stroke   . Atrial thrombus     left  . Pulmonary HTN     Current Outpatient Prescriptions  Medication Sig Dispense Refill  . carvedilol (COREG) 6.25 MG tablet Take 1 tablet (6.25 mg total) by mouth 2 (two) times daily with a meal.      . Choline Fenofibrate (FENOFIBRIC ACID) 45 MG CPDR Take 1 tablet by mouth daily.      . furosemide (LASIX) 40 MG tablet Take 40 mg by mouth 2 (two) times daily. Take extra tab in AM as needed for increased wt and swelling      . loratadine (CLARITIN) 10 MG tablet Take 10 mg by mouth daily.      Marland Kitchen losartan (COZAAR) 25 MG tablet Take 0.5 tablets (12.5 mg total) by mouth 2 (two) times daily.  30 tablet  3  . milrinone (PRIMACOR) 20 MG/100ML SOLN infusion Inject 0.125 mcg/kg/min into the vein continuous.      . potassium  chloride SA (K-DUR,KLOR-CON) 20 MEQ tablet Take 20 mEq by mouth daily.      . simvastatin (ZOCOR) 40 MG tablet Take 40 mg by mouth every evening.      Marland Kitchen spironolactone (ALDACTONE) 25 MG tablet Take 1 tablet (25 mg total) by mouth daily.  30 tablet  6  . Tadalafil, PAH, 20 MG TABS Take 2 tablets (40 mg total) by mouth daily.  60 tablet  6  . traMADol (ULTRAM) 50 MG tablet Take 1 tablet (50 mg total) by mouth 2 (two) times daily as needed for pain.  60 tablet  0  . warfarin (COUMADIN) 4 MG tablet Take 1 tablet (4 mg total) by mouth as directed.  35 tablet  3   No current facility-administered medications for this encounter.     PHYSICAL EXAM: There were no vitals filed for this visit. General:  Elderly, thin male. NAD.Marland Kitchen No resp difficulty Interpreter present HEENT: normal Neck: supple. JVP 7 Carotids 2+ bilaterally; no bruits. No lymphadenopathy or  thryomegaly appreciated. Cor: PMI laterally displaced. RRR. No s3 or murmur. ICD site well-healed  Lungs: clear Abdomen: soft, nontender, nondistended. No hepatosplenomegaly. No bruits or masses. Good bowel sounds. Extremities: no cyanosis, clubbing, rash, extremity edema  RUE PICC - no discharge  Neuro: alert & orientedx3, cranial nerves grossly intact. Moves all 4 extremities w/o difficulty. Affect pleasant.  ASSESSMENT & PLAN: 1. Acute on chronic systolic CHF: Nonischemic cardiomyopathy with EF 20%.  He has a CRT-D device.       --he has failed milrinone wean. Now with Class IIIB-IV HF symptoms. Will need RHC today with probable re-initiation of milrinone  2. Paroxysmal atrial fibrillation: Patient remains in NSR.  He is on warfarin.   3. Pulmonary arterial hypertension:  Continue adcirca 40 mg daily.  PA systolic pressure 59 mmHg today by echo. (were 90 on cath prior to milrinone and adcirca) Will reassess with cath.  4. CKD: Check labs today.   Arvilla Meres MD

## 2013-02-26 ENCOUNTER — Other Ambulatory Visit (HOSPITAL_COMMUNITY): Payer: Self-pay | Admitting: Adult Health

## 2013-02-26 ENCOUNTER — Other Ambulatory Visit (HOSPITAL_COMMUNITY): Payer: Medicare Other

## 2013-02-27 ENCOUNTER — Ambulatory Visit (HOSPITAL_COMMUNITY)
Admission: RE | Admit: 2013-02-27 | Discharge: 2013-02-27 | Disposition: A | Discharge status: Home / Self Care | Payer: Medicare Other | Source: Ambulatory Visit | Attending: Internal Medicine | Admitting: Internal Medicine

## 2013-02-27 VITALS — BP 98/52 | HR 93 | Wt 148.0 lb

## 2013-02-27 DIAGNOSIS — I2789 Other specified pulmonary heart diseases: Secondary | ICD-10-CM | POA: Insufficient documentation

## 2013-02-27 DIAGNOSIS — I4891 Unspecified atrial fibrillation: Secondary | ICD-10-CM | POA: Insufficient documentation

## 2013-02-27 DIAGNOSIS — I2721 Secondary pulmonary arterial hypertension: Secondary | ICD-10-CM

## 2013-02-27 DIAGNOSIS — I5022 Chronic systolic (congestive) heart failure: Secondary | ICD-10-CM | POA: Insufficient documentation

## 2013-02-27 NOTE — Patient Instructions (Addendum)
Follow up in 1 month.  Doing great.

## 2013-03-03 NOTE — Progress Notes (Signed)
Patient ID: Tyler Christensen, male   DOB: 1933/02/25, 79 y.o.   MRN: [id number]  HPI: Tyler Christensen is a 79 y.o. [name] (non-english speaking) w/ PMHx significant for chronic systolic CHF (EF 40%), NICM, LBBB, PAF (on coumadin), HTN, HLD, CVA, Pulmonary Hypertension and h/o seizure d/o (2/2 CVA 05/2012).   Admitted to [facility] 06/2012 with acute decompensated heart failure. As noted below diagnosed with severe pulmonary hypertension from initial RHC and placed on Milrinone and Revatio (eventually transitioned to tadalafil 40). VQ low probability for pulmonary embolus. Repeat RHC on 07/06/12 with markedly improved PVR and Cardiac Index. Discharged on Milrinone at 0.375 mcg via PICC. Discharge Weight 146 pounds    08/14/12 S/P successful DC-CV of AF 10/26/12 S/P BiV-ICD implant 09/28/12 ECHO EF 15% 01/07/13 ECHO EF 20%, restrictive diastolic function, mildly decreased RV size and systolic fxn, mild to moderate MR, PA systolic pressure 59 mmHg  02/21/13 ECHO EF 20% PA pressure mean 59   RHC 02/21/13 (off milrinone) RA = 9 with v-waves to 20  RV = 72/8/20  PA = 69/27 (44)  PCW = 18-20  Fick cardiac output/index = 2.7/1.5  PVR = 9.6 woods  FA sat = 96%  PA sat = 48%, 52%  Follow up:  Interpreter present. Recently failed milrinone wean. Milrinone restarted last week after RHC above. Doing much better now that milrinone restarted. Denies SOB, orthopnea, PND or CP. Walking 10 min everyday with no stopping of SOB. Taking medications as prescribed. + appetite. Pain in R arm that he has had for 3-4 months 5/10 that tramadol helps.   NO ACE-I 2/2 rash  Labs (7/14): HCT 39.7, K 4.6, creatinine 1.2          (8/26) HCT 37.7 K 4.3 Creatinine 1.6        (02/26/13) HCT 38.2, K 4.4, Creatinine 1.43 Mag 2.2  SH: Lives with his wife here in [place].   ROS: All systems negative except as listed in HPI, PMH and Problem List.  Past Medical History  Diagnosis Date  . HTN (hypertension)   . High cholesterol   . CHF  (congestive heart failure)   . BBBB (bilateral bundle branch block)   . LBBB (left bundle branch block)   . Weakness   . Tired   . Seizures 06/11/2012    new onset/notes (06/11/2012)  . SOB (shortness of breath)     "sometimes when I lay down;; related to not taking my RX" (06/11/2012)  . Myocardial infarction     06/10/2012  . Atrial fibrillation   . Stroke   . Atrial thrombus     left  . Pulmonary HTN     Current Outpatient Prescriptions  Medication Sig Dispense Refill  . carvedilol (COREG) 6.25 MG tablet Take 1 tablet (6.25 mg total) by mouth 2 (two) times daily with a meal.      . Choline Fenofibrate (FENOFIBRIC ACID) 45 MG CPDR Take 1 tablet by mouth daily.      . furosemide (LASIX) 40 MG tablet Take 40 mg by mouth 2 (two) times daily. Take extra tab in AM as needed for increased wt and swelling      . loratadine (CLARITIN) 10 MG tablet Take 10 mg by mouth daily.      Marland Kitchen losartan (COZAAR) 25 MG tablet Take 0.5 tablets (12.5 mg total) by mouth 2 (two) times daily.  30 tablet  3  . milrinone (PRIMACOR) 20 MG/100ML SOLN infusion Inject 0.125 mcg/kg/min into the vein continuous.      Marland Kitchen  potassium chloride SA (K-DUR,KLOR-CON) 20 MEQ tablet Take 20 mEq by mouth daily.      . simvastatin (ZOCOR) 40 MG tablet Take 40 mg by mouth every evening.      Marland Kitchen spironolactone (ALDACTONE) 25 MG tablet Take 1 tablet (25 mg total) by mouth daily.  30 tablet  6  . Tadalafil, PAH, 20 MG TABS Take 2 tablets (40 mg total) by mouth daily.  60 tablet  6  . warfarin (COUMADIN) 4 MG tablet Take 1 tablet (4 mg total) by mouth as directed.  35 tablet  3  . traMADol (ULTRAM) 50 MG tablet TAKE 1 TABLET BY MOUTH TWICE DAILY AS NEEDED FOR PAIN  60 tablet  2   No current facility-administered medications for this encounter.     PHYSICAL EXAM: Filed Vitals:   02/27/13 1502  BP: 98/52  Pulse: 93  Weight: 148 lb (67.132 kg)  SpO2: 96%   General:  Elderly, thin male. NAD.Marland Kitchen No resp difficulty Interpreter  present HEENT: normal Neck: supple. JVP 7 Carotids 2+ bilaterally; no bruits. No lymphadenopathy or thryomegaly appreciated. Cor: PMI laterally displaced. RRR. No s3 or murmur. ICD site well-healed  Lungs: clear Abdomen: soft, nontender, nondistended. No hepatosplenomegaly. No bruits or masses. Good bowel sounds. Extremities: no cyanosis, clubbing, rash, extremity edema  RUE PICC - no discharge; mild pain with moderate touch and movement of R bicep and shoulder Neuro: alert & orientedx3, cranial nerves grossly intact. Moves all 4 extremities w/o difficulty. Affect pleasant.  ASSESSMENT & PLAN: 1. Chronic systolic CHF: Nonischemic cardiomyopathy with EF 20%.  He has a CRT-D device.  - Recently failed milrinone wean and now back on. Feels much better. NYHA II-III. Volume status looks good - Will continue spiro and losartan. - Given component of PAH and age; I do not feel that he is a good VAD candidate at this point.  2. Paroxysmal atrial fibrillation: Patient remains in NSR.  He is on warfarin.   3. Pulmonary arterial hypertension:  Continue adcirca 40 mg daily.  PA systolic pressure 59 mmHg today by echo. (were 90 on cath prior to milrinone and adcirca) Will reassess with echo after he is back on milrinone for several weeks.  4. CKD: Check labs today.   Arvilla Meres MD 12:31 PM

## 2013-03-04 ENCOUNTER — Ambulatory Visit (INDEPENDENT_AMBULATORY_CARE_PROVIDER_SITE_OTHER): Payer: Medicare Other | Admitting: Cardiology

## 2013-03-04 DIAGNOSIS — I4891 Unspecified atrial fibrillation: Secondary | ICD-10-CM

## 2013-03-04 LAB — POCT INR: INR: 2

## 2013-03-10 DIAGNOSIS — I5023 Acute on chronic systolic (congestive) heart failure: Secondary | ICD-10-CM

## 2013-03-10 DIAGNOSIS — I452 Bifascicular block: Secondary | ICD-10-CM

## 2013-03-10 DIAGNOSIS — I509 Heart failure, unspecified: Secondary | ICD-10-CM

## 2013-03-12 ENCOUNTER — Encounter: Payer: Self-pay | Admitting: Internal Medicine

## 2013-03-12 ENCOUNTER — Other Ambulatory Visit: Payer: Self-pay | Admitting: Internal Medicine

## 2013-03-19 ENCOUNTER — Other Ambulatory Visit (HOSPITAL_COMMUNITY): Payer: Self-pay | Admitting: *Deleted

## 2013-03-19 ENCOUNTER — Telehealth: Payer: Self-pay | Admitting: Internal Medicine

## 2013-03-19 MED ORDER — POTASSIUM CHLORIDE CRYS ER 20 MEQ PO TBCR: 20.0000 meq | EXTENDED_RELEASE_TABLET | Freq: Every day | ORAL | Status: DC

## 2013-03-19 NOTE — Telephone Encounter (Signed)
New Problem:  [name], from [facility],  states pt received has a Programme researcher, broadcasting/film/video. [name] states pt only speaks spanish and he doesn't seem to understand the remote process.

## 2013-03-19 NOTE — Telephone Encounter (Signed)
appt was made 05-06-13 @ 1130 with device clinic. Needs instruction about carelink monitor.

## 2013-03-21 ENCOUNTER — Encounter: Payer: Self-pay | Admitting: Internal Medicine

## 2013-03-22 ENCOUNTER — Encounter: Payer: Self-pay | Admitting: Internal Medicine

## 2013-03-28 ENCOUNTER — Encounter (HOSPITAL_COMMUNITY): Payer: Self-pay

## 2013-03-28 ENCOUNTER — Ambulatory Visit (HOSPITAL_COMMUNITY)
Admission: RE | Admit: 2013-03-28 | Discharge: 2013-03-28 | Disposition: A | Discharge status: Home / Self Care | Payer: Medicare Other | Source: Ambulatory Visit | Attending: Internal Medicine | Admitting: Internal Medicine

## 2013-03-28 VITALS — BP 92/54 | HR 68 | Wt 152.8 lb

## 2013-03-28 DIAGNOSIS — I509 Heart failure, unspecified: Secondary | ICD-10-CM | POA: Insufficient documentation

## 2013-03-28 DIAGNOSIS — Z79899 Other long term (current) drug therapy: Secondary | ICD-10-CM | POA: Insufficient documentation

## 2013-03-28 DIAGNOSIS — I5022 Chronic systolic (congestive) heart failure: Secondary | ICD-10-CM | POA: Insufficient documentation

## 2013-03-28 DIAGNOSIS — I2789 Other specified pulmonary heart diseases: Secondary | ICD-10-CM | POA: Insufficient documentation

## 2013-03-28 DIAGNOSIS — N189 Chronic kidney disease, unspecified: Secondary | ICD-10-CM | POA: Insufficient documentation

## 2013-03-28 DIAGNOSIS — I252 Old myocardial infarction: Secondary | ICD-10-CM | POA: Insufficient documentation

## 2013-03-28 DIAGNOSIS — E785 Hyperlipidemia, unspecified: Secondary | ICD-10-CM | POA: Insufficient documentation

## 2013-03-28 DIAGNOSIS — Z8673 Personal history of transient ischemic attack (TIA), and cerebral infarction without residual deficits: Secondary | ICD-10-CM | POA: Insufficient documentation

## 2013-03-28 DIAGNOSIS — Z7982 Long term (current) use of aspirin: Secondary | ICD-10-CM | POA: Insufficient documentation

## 2013-03-28 DIAGNOSIS — I129 Hypertensive chronic kidney disease with stage 1 through stage 4 chronic kidney disease, or unspecified chronic kidney disease: Secondary | ICD-10-CM | POA: Insufficient documentation

## 2013-03-28 DIAGNOSIS — I4891 Unspecified atrial fibrillation: Secondary | ICD-10-CM | POA: Insufficient documentation

## 2013-03-28 DIAGNOSIS — I2721 Secondary pulmonary arterial hypertension: Secondary | ICD-10-CM

## 2013-03-28 NOTE — Progress Notes (Signed)
Patient ID: Tyler Christensen, male   DOB: 07-09-1932, 79 y.o.   MRN: [id number]  HPI: Tyler Christensen is a 79 y.o. [name] (non-english speaking) w/ PMHx significant for chronic systolic CHF (EF 01%), NICM, LBBB, PAF (on coumadin), HTN, HLD, CVA, Pulmonary Hypertension and h/o seizure d/o (2/2 CVA 05/2012).   Admitted to [facility] 06/2012 with acute decompensated heart failure. As noted below diagnosed with severe pulmonary hypertension from initial RHC and placed on Milrinone and Revatio (eventually transitioned to tadalafil 40). VQ low probability for pulmonary embolus. Repeat RHC on 07/06/12 with markedly improved PVR and Cardiac Index. Discharged on Milrinone at 0.375 mcg via PICC. Discharge Weight 146 pounds    08/14/12 S/P successful DC-CV of AF 10/26/12 S/P BiV-ICD implant 09/28/12 ECHO EF 15% 01/07/13 ECHO EF 20%, restrictive diastolic function, mildly decreased RV size and systolic fxn, mild to moderate MR, PA systolic pressure 59 mmHg  02/21/13 ECHO EF 20% PA pressure mean 59   RHC 02/21/13 (off milrinone) RA = 9 with v-waves to 20  RV = 72/8/20  PA = 69/27 (44)  PCW = 18-20  Fick cardiac output/index = 2.7/1.5  PVR = 9.6 woods  FA sat = 96%  PA sat = 48%, 52%  He was titrated off milrinone in 8/14-9/14 but clinically worsened so milrinone was restarted.    Follow up: No interpreter present. From using the very little Spanish I know and Programmer, applications on the computer it sounds as if he is more SOB (mildly). However, he is still feeling better back on milrinone compared to after stopping milrinone.  Reports that his weight is up 10 lbs on his scale, however only 4 lbs on our scale. He has been taking 40 mg Lasix tid with very little UOP. +abdominal distention. Reports taking medications as prescribed.    NO ACE-I 2/2 rash  Labs (7/14): HCT 39.7, K 4.6, creatinine 1.2          (8/26) HCT 37.7 K 4.3 Creatinine 1.6        (02/26/13) HCT 38.2, K 4.4, Creatinine 1.43 Mag 2.2       (10/14) K 3.8, creatinine  1.34  SH: Lives with his wife here in [place].   ROS: All systems negative except as listed in HPI, PMH and Problem List.  Past Medical History  Diagnosis Date  . HTN (hypertension)   . High cholesterol   . CHF (congestive heart failure)   . BBBB (bilateral bundle branch block)   . LBBB (left bundle branch block)   . Weakness   . Tired   . Seizures 06/11/2012    new onset/notes (06/11/2012)  . SOB (shortness of breath)     "sometimes when I lay down;; related to not taking my RX" (06/11/2012)  . Myocardial infarction     06/10/2012  . Atrial fibrillation   . Stroke   . Atrial thrombus     left  . Pulmonary HTN     Current Outpatient Prescriptions  Medication Sig Dispense Refill  . carvedilol (COREG) 6.25 MG tablet Take 1 tablet (6.25 mg total) by mouth 2 (two) times daily with a meal.      . Choline Fenofibrate (FENOFIBRIC ACID) 45 MG CPDR Take 1 tablet by mouth daily.      . furosemide (LASIX) 40 MG tablet Take 40 mg by mouth 2 (two) times daily. Take extra tab in AM as needed for increased wt and swelling      . loratadine (CLARITIN) 10 MG  tablet TAKE 1 TABLET BY MOUTH ONCE DAILY  30 tablet  3  . losartan (COZAAR) 25 MG tablet Take 0.5 tablets (12.5 mg total) by mouth 2 (two) times daily.  30 tablet  3  . milrinone (PRIMACOR) 20 MG/100ML SOLN infusion Inject 0.125 mcg/kg/min into the vein continuous.      . potassium chloride SA (K-DUR,KLOR-CON) 20 MEQ tablet Take 1 tablet (20 mEq total) by mouth daily.  30 tablet  6  . simvastatin (ZOCOR) 40 MG tablet Take 40 mg by mouth every evening.      Marland Kitchen spironolactone (ALDACTONE) 25 MG tablet Take 1 tablet (25 mg total) by mouth daily.  30 tablet  6  . Tadalafil, PAH, 20 MG TABS Take 2 tablets (40 mg total) by mouth daily.  60 tablet  6  . traMADol (ULTRAM) 50 MG tablet TAKE 1 TABLET BY MOUTH TWICE DAILY AS NEEDED FOR PAIN  60 tablet  2  . warfarin (COUMADIN) 4 MG tablet Take 1 tablet (4 mg total) by mouth as directed.  35 tablet   3   No current facility-administered medications for this encounter.    Filed Vitals:   03/28/13 1415  BP: 92/54  Pulse: 68  Weight: 152 lb 12.8 oz (69.31 kg)  SpO2: 97%    PHYSICAL EXAM: General:  Elderly, thin male. NAD.No resp difficulty HEENT: normal Neck: supple. JVP 8, +HJR. Carotids 2+ bilaterally; no bruits. No lymphadenopathy or thryomegaly appreciated. Cor: PMI laterally displaced. RRR. No s3 or murmur.  Lungs: clear Abdomen: soft, nontender, + mildly distended. No hepatosplenomegaly. No bruits or masses. Good bowel sounds. Extremities: no cyanosis, clubbing, rash, extremity edema  RUE PICC - no discharge; Neuro: alert & orientedx3, cranial nerves grossly intact. Moves all 4 extremities w/o difficulty. Affect pleasant.  ASSESSMENT & PLAN:  1. Chronic systolic CHF: NICM, EF 20% (12/2012) s/p CRT-D. Tyler Christensen is currently on milrinone 0.25. We tried to wean in the past, however he did not tolerate and it was restarted. He is not a good VAD candidate.  He presents today with NYHA II-III symptoms.  He appears mildly volume overloaded with some weight gain.  - Will continue the milrinone 0.25 and increase lasix to 80 mg BID. With increase in lasix will also increase K+ to 40 mEq daily.  - Continue current doses of spironolactone, Coreg, and losartan.   - BMETs weekly with [facility] and will follow up in 1 week in clinic. 2. Paroxysmal atrial fibrillation: Appears to be in NSR today. - Continue Coumadin, followed in Coumadin clinic 3. Pulmonary arterial hypertension: Noted on RHC 02/21/13.  Now on Adcirca 40 mg daily.  With no milrinone, PVR 9.6 WU and PA mean 44 mmHg. Milrinone was restarted and pressures likely are better. Will need to get a repeat ECHO to assess PA systolic pressure (will arrange). Could consider adding ERA if patient still continues to have high pressures and increased symptoms. 4. CKD - Stable. BMET weekly with [facility].  Provided the patient with instructions on  medication changes in Spanish. Will make sure that interpreter is able to be with patient next visit.   Tyler Christensen B NP-C 6:47 PM  Patient seen with NP, agree with the above note.  Patient does appear to be mildly volume overloaded and mildly more symptomatic recently.  Agree with increasing Lasix to 80 mg bid with BMET and followup in 1 week.  Will not titrate other meds with soft BP.  Continue home milrinone.  Will get echo  for estimation of PA pressure.   Tyler Christensen 03/29/2013

## 2013-03-28 NOTE — Patient Instructions (Signed)
Tome 2 pastillas de lasix en la maana y 2 pastillas en la noche.   Tome 2 pastillas de potasio por [employer].   Seguimiento 1 semana

## 2013-04-02 ENCOUNTER — Ambulatory Visit (INDEPENDENT_AMBULATORY_CARE_PROVIDER_SITE_OTHER): Payer: Medicare Other | Admitting: Pharmacist

## 2013-04-02 DIAGNOSIS — I4891 Unspecified atrial fibrillation: Secondary | ICD-10-CM

## 2013-04-02 LAB — POCT INR: INR: 2

## 2013-04-04 ENCOUNTER — Ambulatory Visit (HOSPITAL_COMMUNITY)
Admission: RE | Admit: 2013-04-04 | Discharge: 2013-04-04 | Disposition: A | Discharge status: Home / Self Care | Payer: Medicare Other | Source: Ambulatory Visit | Attending: Internal Medicine | Admitting: Internal Medicine

## 2013-04-04 VITALS — BP 100/58 | HR 90 | Wt 149.5 lb

## 2013-04-04 DIAGNOSIS — I129 Hypertensive chronic kidney disease with stage 1 through stage 4 chronic kidney disease, or unspecified chronic kidney disease: Secondary | ICD-10-CM | POA: Insufficient documentation

## 2013-04-04 DIAGNOSIS — G40909 Epilepsy, unspecified, not intractable, without status epilepticus: Secondary | ICD-10-CM | POA: Insufficient documentation

## 2013-04-04 DIAGNOSIS — N189 Chronic kidney disease, unspecified: Secondary | ICD-10-CM | POA: Insufficient documentation

## 2013-04-04 DIAGNOSIS — I252 Old myocardial infarction: Secondary | ICD-10-CM | POA: Insufficient documentation

## 2013-04-04 DIAGNOSIS — Z7901 Long term (current) use of anticoagulants: Secondary | ICD-10-CM | POA: Insufficient documentation

## 2013-04-04 DIAGNOSIS — Z79899 Other long term (current) drug therapy: Secondary | ICD-10-CM | POA: Insufficient documentation

## 2013-04-04 DIAGNOSIS — I509 Heart failure, unspecified: Secondary | ICD-10-CM | POA: Insufficient documentation

## 2013-04-04 DIAGNOSIS — I2789 Other specified pulmonary heart diseases: Secondary | ICD-10-CM | POA: Insufficient documentation

## 2013-04-04 DIAGNOSIS — I4891 Unspecified atrial fibrillation: Secondary | ICD-10-CM | POA: Insufficient documentation

## 2013-04-04 DIAGNOSIS — E785 Hyperlipidemia, unspecified: Secondary | ICD-10-CM | POA: Insufficient documentation

## 2013-04-04 DIAGNOSIS — I69998 Other sequelae following unspecified cerebrovascular disease: Secondary | ICD-10-CM | POA: Insufficient documentation

## 2013-04-04 DIAGNOSIS — I452 Bifascicular block: Secondary | ICD-10-CM | POA: Insufficient documentation

## 2013-04-04 DIAGNOSIS — I2721 Secondary pulmonary arterial hypertension: Secondary | ICD-10-CM

## 2013-04-04 DIAGNOSIS — I5022 Chronic systolic (congestive) heart failure: Secondary | ICD-10-CM | POA: Insufficient documentation

## 2013-04-04 NOTE — Progress Notes (Signed)
Patient ID: Tyler Christensen, male   DOB: 1933-03-06, 79 y.o.   MRN: [id number] HPI: Tyler Christensen is a 79 y.o. [name] (non-english speaking) w/ PMHx significant for chronic systolic CHF (EF 40%), NICM, LBBB, PAF (on coumadin), HTN, HLD, CVA, Pulmonary Hypertension and h/o seizure d/o (2/2 CVA 05/2012).   Admitted to [name] 06/2012 with acute decompensated heart failure. As noted below diagnosed with severe pulmonary hypertension from initial RHC and placed on Milrinone and Revatio (eventually transitioned to tadalafil 40). VQ low probability for pulmonary embolus. Repeat RHC on 07/06/12 with markedly improved PVR and Cardiac Index. Discharged on Milrinone at 0.375 mcg via PICC. Discharge Weight 146 pounds    08/14/12 S/P successful DC-CV of AF 10/26/12 S/P BiV-ICD implant 09/28/12 ECHO EF 15% 01/07/13 ECHO EF 20%, restrictive diastolic function, mildly decreased RV size and systolic fxn, mild to moderate MR, PA systolic pressure 59 mmHg  02/21/13 ECHO EF 20% PA pressure mean 59   RHC 02/21/13 (off milrinone) RA = 9 with v-waves to 20  RV = 72/8/20  PA = 69/27 (44)  PCW = 18-20  Fick cardiac output/index = 2.7/1.5  PVR = 9.6 woods  FA sat = 96%  PA sat = 48%, 52%  He was titrated off milrinone in 8/14-9/14 but clinically worsened with recurrent PH and low output HF. Milrinone was restarted.    Spanish Interpreter Present. Remains on milrinone. He returns for follow up. Last visit had volume overload. Lasix was increased to 80 mg twice a day. Weight at home 148-149 pounds.  Denies SOB/PND/Orthopnea/dizziness/edema.  Compliant with medications. Followed by [facility]. Good appetite. Following low salt diet. Able to do ADLs without problem. Walks 10-15 minutes every day without a problem. No bleeding with couamdin.   NO ACE-I 2/2 rash  Labs (7/14): HCT 39.7, K 4.6, creatinine 1.2          (8/26) HCT 37.7 K 4.3 Creatinine 1.6          (02/26/13) HCT 38.2, K 4.4, Creatinine 1.43 Mag 2.2         (10/14) K 4.5, creatinine  1.55           SH: Lives with his wife here in [place].   ROS: All systems negative except as listed in HPI, PMH and Problem List.  Past Medical History  Diagnosis Date  . HTN (hypertension)   . High cholesterol   . CHF (congestive heart failure)   . BBBB (bilateral bundle branch block)   . LBBB (left bundle branch block)   . Weakness   . Tired   . Seizures 06/11/2012    new onset/notes (06/11/2012)  . SOB (shortness of breath)     "sometimes when I lay down;; related to not taking my RX" (06/11/2012)  . Myocardial infarction     06/10/2012  . Atrial fibrillation   . Stroke   . Atrial thrombus     left  . Pulmonary HTN     Current Outpatient Prescriptions  Medication Sig Dispense Refill  . carvedilol (COREG) 6.25 MG tablet Take 1 tablet (6.25 mg total) by mouth 2 (two) times daily with a meal.      . Choline Fenofibrate (FENOFIBRIC ACID) 45 MG CPDR Take 1 tablet by mouth daily.      . furosemide (LASIX) 40 MG tablet Take 40 mg by mouth 2 (two) times daily. Take extra tab in AM as needed for increased wt and swelling      . loratadine (CLARITIN) 10  MG tablet TAKE 1 TABLET BY MOUTH ONCE DAILY  30 tablet  3  . losartan (COZAAR) 25 MG tablet Take 0.5 tablets (12.5 mg total) by mouth 2 (two) times daily.  30 tablet  3  . milrinone (PRIMACOR) 20 MG/100ML SOLN infusion Inject 0.125 mcg/kg/min into the vein continuous.      . potassium chloride SA (K-DUR,KLOR-CON) 20 MEQ tablet Take 20 mEq by mouth 2 (two) times daily.      . simvastatin (ZOCOR) 40 MG tablet Take 40 mg by mouth every evening.      Marland Kitchen spironolactone (ALDACTONE) 25 MG tablet Take 1 tablet (25 mg total) by mouth daily.  30 tablet  6  . Tadalafil, PAH, 20 MG TABS Take 2 tablets (40 mg total) by mouth daily.  60 tablet  6  . traMADol (ULTRAM) 50 MG tablet TAKE 1 TABLET BY MOUTH TWICE DAILY AS NEEDED FOR PAIN  60 tablet  2  . warfarin (COUMADIN) 4 MG tablet Take 1 tablet (4 mg total) by mouth as directed.  35 tablet   3   No current facility-administered medications for this encounter.    Filed Vitals:   04/04/13 1002  BP: 100/58  Pulse: 90  Weight: 149 lb 8 oz (67.813 kg)  SpO2: 100%    PHYSICAL EXAM: General:  Elderly, thin male. NAD.No resp difficulty HEENT: normal Neck: supple. JVP6-7. Carotids 2+ bilaterally; no bruits. No lymphadenopathy or thryomegaly appreciated. Cor: PMI laterally displaced. RRR. No s3 or murmur.  Lungs: clear Abdomen: soft, nontender, + minimally distended. No hepatosplenomegaly. No bruits or masses. Good bowel sounds. Extremities: no cyanosis, clubbing, rash, no extremity edema  RUE PICC - no discharge; Neuro: alert & orientedx3, cranial nerves grossly intact. Moves all 4 extremities w/o difficulty. Affect pleasant.  ASSESSMENT & PLAN:  1. Chronic systolic CHF: NICM, EF 20% (12/2012) s/p CRT-D. [name] is currently on milrinone 0.25. We tried to wean in the past, however he did not tolerate and it was restarted. He is not a good VAD candidate due to [facility]. Will need lifelong milrinone Looks great today. - Continue current regimen. Reinforced need for daily weights and reviewed use of sliding scale diuretics via interpreter.  - BMET weekly by [facility].  2. Paroxysmal atrial fibrillation: In NSR today. - Continue Coumadin, followed in Coumadin clinic 3. Pulmonary arterial hypertension: Noted on RHC 02/21/13.  Now on Adcirca 40 mg daily.  With no milrinone, PVR 9.6 WU and PA mean 44 mmHg. Milrinone was restarted and pressures improved. May need f/u RHC at some point.  4. CKD - Stable. BMET weekly with [facility].  Arvilla Meres MD 10:54 AM

## 2013-04-09 ENCOUNTER — Ambulatory Visit (HOSPITAL_COMMUNITY)
Admission: RE | Admit: 2013-04-09 | Discharge: 2013-04-09 | Disposition: A | Discharge status: Home / Self Care | Payer: Medicare Other | Source: Ambulatory Visit | Attending: Internal Medicine | Admitting: Internal Medicine

## 2013-04-09 ENCOUNTER — Telehealth (HOSPITAL_COMMUNITY): Payer: Self-pay | Admitting: *Deleted

## 2013-04-09 ENCOUNTER — Other Ambulatory Visit (HOSPITAL_COMMUNITY): Payer: Self-pay | Admitting: Adult Health

## 2013-04-09 VITALS — BP 108/82 | HR 100 | Temp 97.5°F | Resp 20 | Ht 67.0 in | Wt 144.0 lb

## 2013-04-09 DIAGNOSIS — I509 Heart failure, unspecified: Secondary | ICD-10-CM | POA: Insufficient documentation

## 2013-04-09 DIAGNOSIS — I4891 Unspecified atrial fibrillation: Secondary | ICD-10-CM | POA: Insufficient documentation

## 2013-04-09 DIAGNOSIS — I5022 Chronic systolic (congestive) heart failure: Secondary | ICD-10-CM

## 2013-04-09 DIAGNOSIS — I129 Hypertensive chronic kidney disease with stage 1 through stage 4 chronic kidney disease, or unspecified chronic kidney disease: Secondary | ICD-10-CM | POA: Insufficient documentation

## 2013-04-09 DIAGNOSIS — T82898A Other specified complication of vascular prosthetic devices, implants and grafts, initial encounter: Secondary | ICD-10-CM | POA: Insufficient documentation

## 2013-04-09 DIAGNOSIS — N189 Chronic kidney disease, unspecified: Secondary | ICD-10-CM | POA: Insufficient documentation

## 2013-04-09 DIAGNOSIS — Y849 Medical procedure, unspecified as the cause of abnormal reaction of the patient, or of later complication, without mention of misadventure at the time of the procedure: Secondary | ICD-10-CM | POA: Insufficient documentation

## 2013-04-09 MED ORDER — ALTEPLASE 100 MG IV SOLR
2.0000 mg | Freq: Once | INTRAVENOUS | Status: AC
  Administered 2013-04-09: 2 mg
  Filled 2013-04-09: qty 2

## 2013-04-09 MED ORDER — ALTEPLASE 2 MG IJ SOLR
2.0000 mg | Freq: Once | INTRAMUSCULAR | Status: AC
  Administered 2013-04-09: 15:00:00 2 mg
  Filled 2013-04-09: qty 2

## 2013-04-09 MED ORDER — HEPARIN SOD (PORK) LOCK FLUSH 100 UNIT/ML IV SOLN
INTRAVENOUS | Status: AC
  Filled 2013-04-09: qty 5

## 2013-04-09 NOTE — Progress Notes (Signed)
Iv team came and inserted TPA to dwell at 1500 and removed it at 5pm.  Pam, from [facility] then came and hooked milrinone gtt back up and instructed pt.

## 2013-04-09 NOTE — Telephone Encounter (Signed)
[name] called concerned about pt's picc line, she states that pt's red port is very sluggish, she tried to draw back from purple port but it would not draw back and when she hooked it back up to milrinone it kept alarming "high pressure" so she has the milrinone running through red port now, due to insurance she is not able to give cathflow in the home, have arranged for pt to get TPA through short stay today at 1:30, interpreter requested

## 2013-04-12 ENCOUNTER — Encounter: Payer: Self-pay | Admitting: Internal Medicine

## 2013-04-16 ENCOUNTER — Other Ambulatory Visit (HOSPITAL_COMMUNITY): Payer: Self-pay | Admitting: Cardiology

## 2013-04-16 ENCOUNTER — Other Ambulatory Visit: Payer: Self-pay | Admitting: Internal Medicine

## 2013-04-16 DIAGNOSIS — I509 Heart failure, unspecified: Secondary | ICD-10-CM

## 2013-04-16 MED ORDER — POTASSIUM CHLORIDE CRYS ER 20 MEQ PO TBCR: 20.0000 meq | EXTENDED_RELEASE_TABLET | Freq: Two times a day (BID) | ORAL | Status: DC

## 2013-04-26 ENCOUNTER — Encounter: Payer: Self-pay | Admitting: Internal Medicine

## 2013-04-28 ENCOUNTER — Other Ambulatory Visit: Payer: Self-pay | Admitting: Internal Medicine

## 2013-04-30 ENCOUNTER — Ambulatory Visit (INDEPENDENT_AMBULATORY_CARE_PROVIDER_SITE_OTHER): Payer: Medicare Other | Admitting: Internal Medicine

## 2013-04-30 ENCOUNTER — Other Ambulatory Visit (HOSPITAL_COMMUNITY): Payer: Self-pay | Admitting: Adult Health

## 2013-04-30 DIAGNOSIS — I4891 Unspecified atrial fibrillation: Secondary | ICD-10-CM

## 2013-04-30 LAB — POCT INR: INR: 2.7

## 2013-05-06 ENCOUNTER — Encounter: Payer: Self-pay | Admitting: Internal Medicine

## 2013-05-06 ENCOUNTER — Ambulatory Visit (INDEPENDENT_AMBULATORY_CARE_PROVIDER_SITE_OTHER): Payer: Medicare Other | Admitting: *Deleted

## 2013-05-06 ENCOUNTER — Encounter (HOSPITAL_COMMUNITY): Payer: Self-pay

## 2013-05-06 ENCOUNTER — Ambulatory Visit (HOSPITAL_COMMUNITY)
Admission: RE | Admit: 2013-05-06 | Discharge: 2013-05-06 | Disposition: A | Discharge status: Home / Self Care | Payer: Medicare Other | Source: Ambulatory Visit | Attending: Internal Medicine | Admitting: Internal Medicine

## 2013-05-06 VITALS — BP 124/66 | HR 93 | Wt 154.8 lb

## 2013-05-06 DIAGNOSIS — I509 Heart failure, unspecified: Secondary | ICD-10-CM | POA: Insufficient documentation

## 2013-05-06 DIAGNOSIS — I5022 Chronic systolic (congestive) heart failure: Secondary | ICD-10-CM

## 2013-05-06 DIAGNOSIS — I2721 Secondary pulmonary arterial hypertension: Secondary | ICD-10-CM

## 2013-05-06 DIAGNOSIS — I4891 Unspecified atrial fibrillation: Secondary | ICD-10-CM

## 2013-05-06 DIAGNOSIS — I2789 Other specified pulmonary heart diseases: Secondary | ICD-10-CM

## 2013-05-06 DIAGNOSIS — I428 Other cardiomyopathies: Secondary | ICD-10-CM

## 2013-05-06 LAB — MDC_IDC_ENUM_SESS_TYPE_INCLINIC
Battery Remaining Longevity: 97 mo
Battery Voltage: 3.02 V
Brady Statistic AP VP Percent: 0.25 %
Brady Statistic AP VS Percent: 0.02 %
Brady Statistic AS VP Percent: 93.2 %
Brady Statistic AS VS Percent: 6.53 %
Brady Statistic RA Percent Paced: 0.27 %
Brady Statistic RV Percent Paced: 84.5 %
Date Time Interrogation Session: 20141117122509
HighPow Impedance: 190 Ohm
HighPow Impedance: 70 Ohm
Lead Channel Impedance Value: 380 Ohm
Lead Channel Impedance Value: 494 Ohm
Lead Channel Impedance Value: 532 Ohm
Lead Channel Pacing Threshold Amplitude: 0.5 V
Lead Channel Pacing Threshold Amplitude: 0.625 V
Lead Channel Pacing Threshold Pulse Width: 0.4 ms
Lead Channel Pacing Threshold Pulse Width: 0.4 ms
Lead Channel Sensing Intrinsic Amplitude: 4.625 mV
Lead Channel Sensing Intrinsic Amplitude: 8 mV
Lead Channel Setting Pacing Amplitude: 1.5 V
Lead Channel Setting Pacing Amplitude: 1.5 V
Lead Channel Setting Pacing Amplitude: 2 V
Lead Channel Setting Pacing Pulse Width: 0.4 ms
Lead Channel Setting Sensing Sensitivity: 0.3 mV
Zone Setting Detection Interval: 300 ms
Zone Setting Detection Interval: 360 ms
Zone Setting Detection Interval: 450 ms

## 2013-05-06 MED ORDER — TADALAFIL (PAH) 20 MG PO TABS: 40.0000 mg | ORAL_TABLET | Freq: Every day | ORAL | Status: DC

## 2013-05-06 MED ORDER — POTASSIUM CHLORIDE CRYS ER 20 MEQ PO TBCR: 20.0000 meq | EXTENDED_RELEASE_TABLET | Freq: Every day | ORAL | Status: DC

## 2013-05-06 MED ORDER — LOSARTAN POTASSIUM 25 MG PO TABS: 25.0000 mg | ORAL_TABLET | Freq: Every day | ORAL | Status: DC

## 2013-05-06 NOTE — Progress Notes (Signed)
CRT-D device check in office. Thresholds and sensing consistent with previous device measurements. Lead impedance trends stable over time. No mode switch episodes recorded. No ventricular arrhythmia episodes recorded. Patient bi-ventricularly pacing >93.5% of the time. Device programmed with appropriate safety margins. Heart failure diagnostics reviewed and trends are stable for patient. Audible/vibratory alerts demonstrated for patient. No changes made this session. Estimated longevity 8.1 years.  Patient education completed including shock plan.  Patient refuses [facility] @ this time.

## 2013-05-06 NOTE — Progress Notes (Signed)
Interpreter Wyvonnia Dusky for Dr Kittie Plater

## 2013-05-06 NOTE — Progress Notes (Signed)
Patient ID: Tyler Christensen, male   DOB: [birth year], 79 y.o.   MRN: [id number]  HPI: Tyler Christensen is a 79 y.o. [name] (non-english speaking) w/ PMHx significant for chronic systolic CHF (EF 91%), NICM, LBBB, PAF (on coumadin), HTN, HLD, CVA, Pulmonary Hypertension and h/o seizure d/o (2/2 CVA 05/2012).   Admitted to [facility] 06/2012 with acute decompensated heart failure EF 20%. As noted below, also diagnosed with severe pulmonary hypertension (PAPs ~ 90) from initial RHC and placed on Milrinone and Revatio (eventually transitioned to tadalafil 40) with marked clinical response and decrease in PA pressures. VQ low probability for pulmonary embolus.  Discharged on Milrinone at 0.375 mcg via PICC. Discharge Weight 146 pounds. Attempted to wean milrinone in 9/14 but failed due to worsening HF and PAH. Milrinone restarted.   08/14/12 S/P successful DC-CV of AF 10/26/12 S/P BiV-ICD implant 09/28/12 ECHO EF 15% 01/07/13 ECHO EF 20%, restrictive diastolic function, mildly decreased RV size and systolic fxn, mild to moderate MR, PA systolic pressure 59 mmHg  02/21/13 ECHO EF 20% PA pressure mean 59   RHC 02/21/13 (off milrinone) RA = 9 with v-waves to 20  RV = 72/8/20  PA = 69/27 (44)  PCW = 18-20  Fick cardiac output/index = 2.7/1.5  PVR = 9.6 woods  FA sat = 96%  PA sat = 48%, 52%   Follow up: Interpreter present. Since last visit had to go to Short Stay for TPA of PICC line. Feeling very well. Denies SOB, orthopnea, edema or CP. Weight at home 144-145 lbs. Followed by [facility] and takes medications as prescribed. Walks everyday 15 minutes. No bleeding issues. Tyler Christensen his home health nurse reports he is taking 1 potassium chloride pill one days and 2 other days. He reports the reason why is because last time we changed to 20 meq BID, however on bottle only says once a day and only had 30 pills.  NO ACE-I 2/2 rash  Labs (7/14): HCT 39.7, K 4.6, creatinine 1.2          (8/26) HCT 37.7 K 4.3 Creatinine 1.6          (02/26/13)  HCT 38.2, K 4.4, Creatinine 1.43 Mag 2.2         (10/14) K 4.5, creatinine 1.55    (04/09/13) K+ 3.9, Cr 1.40    (04/30/13) K +4.0, Cr 1.64, Mag 2.1, pro-BNP 1057        SH: Lives with his wife here in [place].   ROS: All systems negative except as listed in HPI, PMH and Problem List.  Past Medical History  Diagnosis Date  . HTN (hypertension)   . High cholesterol   . CHF (congestive heart failure)   . BBBB (bilateral bundle branch block)   . LBBB (left bundle branch block)   . Weakness   . Tired   . Seizures 06/11/2012    new onset/notes (06/11/2012)  . SOB (shortness of breath)     "sometimes when I lay down;; related to not taking my RX" (06/11/2012)  . Myocardial infarction     06/10/2012  . Atrial fibrillation   . Stroke   . Atrial thrombus     left  . Pulmonary HTN     Current Outpatient Prescriptions  Medication Sig Dispense Refill  . carvedilol (COREG) 6.25 MG tablet Take 1 tablet (6.25 mg total) by mouth 2 (two) times daily with a meal.      . Choline Fenofibrate (FENOFIBRIC ACID) 45 MG CPDR Take 1  tablet by mouth daily.      . furosemide (LASIX) 40 MG tablet TAKE 1 TABLET BY MOUTH TWICE DAILY  60 tablet  6  . loratadine (CLARITIN) 10 MG tablet TAKE 1 TABLET BY MOUTH ONCE DAILY  30 tablet  3  . losartan (COZAAR) 25 MG tablet TAKE 1/2 TABLET BY MOUTH TWICE DAILY  30 tablet  6  . milrinone (PRIMACOR) 20 MG/100ML SOLN infusion Inject 0.125 mcg/kg/min into the vein continuous.      . potassium chloride SA (K-DUR,KLOR-CON) 20 MEQ tablet Take 1 tablet (20 mEq total) by mouth 2 (two) times daily.  60 tablet  3  . simvastatin (ZOCOR) 40 MG tablet Take 40 mg by mouth every evening.      Marland Kitchen spironolactone (ALDACTONE) 25 MG tablet Take 1 tablet (25 mg total) by mouth daily.  30 tablet  6  . Tadalafil, PAH, 20 MG TABS Take 2 tablets (40 mg total) by mouth daily.  60 tablet  6  . traMADol (ULTRAM) 50 MG tablet TAKE 1 TABLET BY MOUTH TWICE DAILY AS NEEDED FOR PAIN  60 tablet   2  . warfarin (COUMADIN) 4 MG tablet Take 1 tablet (4 mg total) by mouth as directed.  35 tablet  3   No current facility-administered medications for this encounter.    Filed Vitals:   05/06/13 0947  BP: 124/66  Pulse: 93  Weight: 154 lb 12.8 oz (70.217 kg)  SpO2: 98%   PHYSICAL EXAM: General:  Elderly, well appearing. NAD.No resp difficulty; interpreter present HEENT: normal Neck: supple. JVP 6-7. Carotids 2+ bilaterally; no bruits. No lymphadenopathy or thryomegaly appreciated. Cor: PMI laterally displaced. RRR. No s3 or murmur.  Lungs: clear Abdomen: soft, nontender, + minimally distended. No hepatosplenomegaly. No bruits or masses. Good bowel sounds. Extremities: no cyanosis, clubbing, rash, no extremity edema  RUE PICC - no discharge; Neuro: alert & orientedx3, cranial nerves grossly intact. Moves all 4 extremities w/o difficulty. Affect pleasant.  ASSESSMENT & PLAN:  1. Chronic systolic CHF: NICM, EF 20% (12/2012) s/p CRT-D.  - NYHA II symptoms and volume status stable. Will continue milrinone 0.25 mcg thru PICC did not tolerate wean.  - Continue coreg 6.25 mg BID, will not titrate with history of low CO. Will increase losartan to 25 mg daily and cut potassium back to 20 meq daily. Recheck BMET next week. - Continue lasix 40 mg BID and spiro 25 mg daily. Cr trending up slightly, will recheck BMET next week and may need to hold lasix for a few days if it is still up. - Optivol reviewed, no crossings over threshold. Patient activity ~3 hrs a day and V paced 100 % - Reinforced the need and importance of daily weights, a low sodium diet, and fluid restriction (less than 2 L a day) through interpreter. Instructed to call the HF clinic if weight increases more than 3 lbs overnight or 5 lbs in a week.   2. Paroxysmal atrial fibrillation:  - Continue Coumadin, followed in Coumadin clinic  3. Pulmonary arterial hypertension: Noted on RHC 02/21/13.  Now on Adcirca 40 mg daily.  With no  milrinone, PVR 9.6 WU and PA mean 44 mmHg. Milrinone was restarted and pressures improved.   4. CKD - Stable. BMET weekly with [facility].   Ulla Potash B  NP-C 10:03 AM  Patient seen and examined with Ulla Potash, NP. ICD diagnostics reviewed personally with patient. We discussed all aspects of the encounter. I agree with the assessment and  plan as stated above.   Doing very well on milrinone. NYHA II-early III. Volume status looks good on exam and by Optivol. Activity level is good. Renal function slightly worse. Will repeat BMET - may have to cut back diuretics a touch.  Reinforced need for daily weights and reviewed use of sliding scale diuretics.Will continue current regimen.   Karita Dralle,MD 4:54 PM

## 2013-05-06 NOTE — Patient Instructions (Signed)
Change your potassium chloride to 20 meq daily. 1 tablet once a day.  Increase losartan to 25 mg daily.  Follow up in 6 weeks.  Do the following things EVERYDAY: 1) Weigh yourself in the morning before breakfast. Write it down and keep it in a log. 2) Take your medicines as prescribed 3) Eat low salt foods-Limit salt (sodium) to 2000 mg per day.  4) Stay as active as you can everyday 5) Limit all fluids for the day to less than 2 liters 6)

## 2013-05-09 DIAGNOSIS — I452 Bifascicular block: Secondary | ICD-10-CM

## 2013-05-09 DIAGNOSIS — I4891 Unspecified atrial fibrillation: Secondary | ICD-10-CM

## 2013-05-09 DIAGNOSIS — I509 Heart failure, unspecified: Secondary | ICD-10-CM

## 2013-05-09 DIAGNOSIS — I5032 Chronic diastolic (congestive) heart failure: Secondary | ICD-10-CM

## 2013-05-13 ENCOUNTER — Other Ambulatory Visit: Payer: Self-pay | Admitting: Internal Medicine

## 2013-05-14 ENCOUNTER — Telehealth (HOSPITAL_COMMUNITY): Payer: Self-pay | Admitting: *Deleted

## 2013-05-14 NOTE — Telephone Encounter (Signed)
Tyler Christensen called concerned about pt she states his wt is up several lbs to 148 today, he usually around 143 at home, discussed w/Ali Elsie Ra, NP will give pt extra dose of lasix and KCL today and tomorrow, Tyler Christensen is drawing labs today and will also add bnp

## 2013-05-15 ENCOUNTER — Encounter: Payer: Self-pay | Admitting: Internal Medicine

## 2013-05-22 ENCOUNTER — Encounter: Payer: Self-pay | Admitting: Internal Medicine

## 2013-05-24 ENCOUNTER — Telehealth (HOSPITAL_COMMUNITY): Payer: Self-pay | Admitting: *Deleted

## 2013-05-24 MED ORDER — MAGNESIUM OXIDE 400 MG PO TABS: 400.0000 mg | ORAL_TABLET | Freq: Every day | ORAL | Status: DC

## 2013-05-24 NOTE — Telephone Encounter (Signed)
Received labs, mag level a little low at 1.8 per Ulla Potash, NP start mog-ox 400 mg daily, Aimee, RN with [facility] aware and will let pt know, rx sent in

## 2013-05-27 ENCOUNTER — Encounter: Payer: Self-pay | Admitting: Internal Medicine

## 2013-05-28 ENCOUNTER — Ambulatory Visit: Payer: Medicare Other | Admitting: Cardiovascular Disease

## 2013-05-28 DIAGNOSIS — I4891 Unspecified atrial fibrillation: Secondary | ICD-10-CM

## 2013-06-04 ENCOUNTER — Ambulatory Visit (INDEPENDENT_AMBULATORY_CARE_PROVIDER_SITE_OTHER): Payer: Medicare Other | Admitting: Cardiology

## 2013-06-04 DIAGNOSIS — I4891 Unspecified atrial fibrillation: Secondary | ICD-10-CM

## 2013-06-04 LAB — POCT INR: INR: 1.7

## 2013-06-11 ENCOUNTER — Ambulatory Visit (INDEPENDENT_AMBULATORY_CARE_PROVIDER_SITE_OTHER): Payer: Medicare Other | Admitting: Pharmacist

## 2013-06-11 DIAGNOSIS — I4891 Unspecified atrial fibrillation: Secondary | ICD-10-CM

## 2013-06-11 LAB — POCT INR: INR: 2.2

## 2013-06-18 LAB — PROTIME-INR: INR: 1.5 — AB (ref <=1.1)

## 2013-06-19 ENCOUNTER — Ambulatory Visit (INDEPENDENT_AMBULATORY_CARE_PROVIDER_SITE_OTHER): Payer: Medicare Other | Admitting: Pharmacist

## 2013-06-19 DIAGNOSIS — I4891 Unspecified atrial fibrillation: Secondary | ICD-10-CM

## 2013-06-19 MED ORDER — WARFARIN SODIUM 4 MG PO TABS: ORAL_TABLET | ORAL | Status: DC

## 2013-06-24 ENCOUNTER — Encounter: Payer: Self-pay | Admitting: Internal Medicine

## 2013-06-26 ENCOUNTER — Ambulatory Visit (INDEPENDENT_AMBULATORY_CARE_PROVIDER_SITE_OTHER): Payer: Medicare Other | Admitting: Cardiovascular Disease

## 2013-06-26 ENCOUNTER — Encounter (HOSPITAL_COMMUNITY): Payer: Self-pay

## 2013-06-26 ENCOUNTER — Ambulatory Visit (HOSPITAL_COMMUNITY)
Admission: RE | Admit: 2013-06-26 | Discharge: 2013-06-26 | Disposition: A | Discharge status: Home / Self Care | Payer: Medicare Other | Source: Ambulatory Visit | Attending: Internal Medicine | Admitting: Internal Medicine

## 2013-06-26 VITALS — BP 108/64 | HR 84 | Wt 159.8 lb

## 2013-06-26 DIAGNOSIS — I2789 Other specified pulmonary heart diseases: Secondary | ICD-10-CM | POA: Insufficient documentation

## 2013-06-26 DIAGNOSIS — I2721 Secondary pulmonary arterial hypertension: Secondary | ICD-10-CM

## 2013-06-26 DIAGNOSIS — I4891 Unspecified atrial fibrillation: Secondary | ICD-10-CM

## 2013-06-26 DIAGNOSIS — I5022 Chronic systolic (congestive) heart failure: Secondary | ICD-10-CM

## 2013-06-26 LAB — PROTIME-INR: INR: 2.3 — AB (ref <=1.1)

## 2013-06-26 MED ORDER — CARVEDILOL 6.25 MG PO TABS: 6.2500 mg | ORAL_TABLET | Freq: Two times a day (BID) | ORAL | Status: DC

## 2013-06-26 NOTE — Patient Instructions (Signed)
Follow up 1 MONTH   Take lasix 80 mg twice a day today and tomorrow. Also take an extra potassium today and tomorrow  Then go back to lasix 40 mg twice a day  Do the following things EVERYDAY: 1) Weigh yourself in the morning before breakfast. Write it down and keep it in a log. 2) Take your medicines as prescribed 3) Eat low salt foods-Limit salt (sodium) to 2000 mg per day.  4) Stay as active as you can everyday 5) Limit all fluids for the day to less than 2 liters

## 2013-06-26 NOTE — Progress Notes (Signed)
Patient ID: Gunnison Hersh, male   DOB: 08-17-1932, 80 y.o.   MRN: [id number]  HPI: Mr. Stollings is a 80 y.o. [name] (non-english speaking) w/ PMHx significant for chronic systolic CHF (EF 40%), NICM, LBBB, PAF (on coumadin), HTN, HLD, CVA, Pulmonary Hypertension and h/o seizure d/o (2/2 CVA 05/2012).   Admitted to [facility] 06/2012 with acute decompensated heart failure EF 20%. As noted below, also diagnosed with severe pulmonary hypertension (PAPs ~ 90) from initial RHC and placed on Milrinone and Revatio (eventually transitioned to tadalafil 40) with marked clinical response and decrease in PA pressures. VQ low probability for pulmonary embolus.  Discharged on Milrinone at 0.375 mcg via PICC. Discharge Weight 146 pounds. Attempted to wean milrinone in 9/14 but failed due to worsening HF and PAH. Milrinone restarted.   08/14/12 S/P successful DC-CV of AF 10/26/12 S/P BiV-ICD implant 09/28/12 ECHO EF 15% 01/07/13 ECHO EF 20%, restrictive diastolic function, mildly decreased RV size and systolic fxn, mild to moderate MR, PA systolic pressure 59 mmHg  02/21/13 ECHO EF 20% PA pressure mean 59   RHC 02/21/13 (off milrinone) RA = 9 with v-waves to 20  RV = 72/8/20  PA = 69/27 (44)  PCW = 18-20  Fick cardiac output/index = 2.7/1.5  PVR = 9.6 woods  FA sat = 96%  PA sat = 48%, 52%  He returns for follow up with interpreter.  Last visit losartan was increased to 25 mg daily. Denies PND. Mild dyspnea this am. + Orthopnea sleeps on 2-3 pillows . Weight at home 143-149 pounds. Taking all medications. Following low salt diet and tries to limit fluid intake to less than 2 liters per day. Followed by [facility] for home Milrinone and weekly BMETs.   NO ACE-I 2/2 rash  Labs (7/14): HCT 39.7, K 4.6, creatinine 1.2          (8/26) HCT 37.7 K 4.3 Creatinine 1.6          (02/26/13) HCT 38.2, K 4.4, Creatinine 1.43 Mag 2.2         (10/14) K 4.5, creatinine 1.55    (04/09/13) K+ 3.9, Cr 1.40    (04/30/13) K +4.0, Cr 1.64, Mag 2.1,  pro-BNP 1057     (06/25/13) K 4.2 Creatinine 1.58 Magnesium 2.0         SH: Lives with his wife here in [place].   ROS: All systems negative except as listed in HPI, PMH and Problem List.  Past Medical History  Diagnosis Date  . HTN (hypertension)   . High cholesterol   . CHF (congestive heart failure)   . BBBB (bilateral bundle branch block)   . LBBB (left bundle branch block)   . Weakness   . Tired   . Seizures 06/11/2012    new onset/notes (06/11/2012)  . SOB (shortness of breath)     "sometimes when I lay down;; related to not taking my RX" (06/11/2012)  . Myocardial infarction     06/10/2012  . Atrial fibrillation   . Stroke   . Atrial thrombus     left  . Pulmonary HTN     Current Outpatient Prescriptions  Medication Sig Dispense Refill  . carvedilol (COREG) 6.25 MG tablet Take 1 tablet (6.25 mg total) by mouth 2 (two) times daily with a meal.      . Choline Fenofibrate (FENOFIBRIC ACID) 45 MG CPDR Take 1 tablet by mouth daily.      . furosemide (LASIX) 40 MG tablet TAKE 1 TABLET  BY MOUTH TWICE DAILY  60 tablet  6  . loratadine (CLARITIN) 10 MG tablet TAKE 1 TABLET BY MOUTH ONCE DAILY  30 tablet  3  . losartan (COZAAR) 25 MG tablet Take 1 tablet (25 mg total) by mouth daily.  30 tablet  6  . magnesium oxide (MAG-OX) 400 MG tablet Take 1 tablet (400 mg total) by mouth daily.  30 tablet  6  . milrinone (PRIMACOR) 20 MG/100ML SOLN infusion Inject 0.125 mcg/kg/min into the vein continuous.      . potassium chloride SA (K-DUR,KLOR-CON) 20 MEQ tablet Take 1 tablet (20 mEq total) by mouth daily.  30 tablet  6  . simvastatin (ZOCOR) 40 MG tablet Take 40 mg by mouth every evening.      Marland Kitchen spironolactone (ALDACTONE) 25 MG tablet Take 1 tablet (25 mg total) by mouth daily.  30 tablet  6  . Tadalafil, PAH, 20 MG TABS Take 2 tablets (40 mg total) by mouth daily.  60 tablet  6  . traMADol (ULTRAM) 50 MG tablet TAKE 1 TABLET BY MOUTH TWICE DAILY AS NEEDED FOR PAIN  60 tablet  2  .  warfarin (COUMADIN) 4 MG tablet Take 1 tablet on all days except 1 and 1/2 tablets on Friday or as directed by coumadin clinic.  35 tablet  3   No current facility-administered medications for this encounter.    Filed Vitals:   06/26/13 1056  BP: 108/64  Pulse: 84  Weight: 159 lb 12.8 oz (72.485 kg)  SpO2: 96%   PHYSICAL EXAM: General:  Elderly, well appearing. NAD.No resp difficulty; interpreter present HEENT: normal Neck: supple. JVP 11-12  Carotids 2+ bilaterally; no bruits. No lymphadenopathy or thryomegaly appreciated. Cor: PMI laterally displaced. RRR. No s3 or murmur.  Lungs: clear Abdomen: soft, nontender, + minimally distended. No hepatosplenomegaly. No bruits or masses. Good bowel sounds. Extremities: no cyanosis, clubbing, rash,  R and LLE 1+  edema.  RUE PICC -  Neuro: alert & orientedx3, cranial nerves grossly intact. Moves all 4 extremities w/o difficulty. Affect pleasant.  ASSESSMENT & PLAN:  1. Chronic systolic CHF: NICM, EF 88% (12/2012) s/p CRT-D.  - NYHA II symptoms and volume status stable. Will continue milrinone 0.25 mcg thru PICC did not tolerate wean.  Volume status mildly elevated. Double lasix for the next two days which will be  Lasix 80 mg twice a day and he will take an extra potassium for the next . Then resume 40 mg twice a day. Continue spironolactone 25 mg daily.  - Continue coreg 6.25 mg BID, will not titrate with history of low CO.  - Continue losartan to 25 mg daily. Will not increase due to soft BP. Reviewed BMET 06/25/13 . Stable. Not on Ace due to rash.  Optivol reviewed- Trending up. Activity about 3 hours a day.  - Reinforced the need and importance of daily weights, a low sodium diet, and fluid restriction (less than 2 L a day) through interpreter. Instructed to call the HF clinic if weight increases more than 3 lbs overnight or 5 lbs in a week.   2. Paroxysmal atrial fibrillation: Continue Coumadin and BB. Followed in Coumadin clinic 3.  Pulmonary arterial hypertension: Noted on RHC 02/21/13.  Now on Adcirca 40 mg daily.  With no milrinone, PVR 9.6 WU and PA mean 44 mmHg. Milrinone was restarted and pressures improved. Continue Milrinone.  4. CKD - Stable. BMET weekly with [facility].  Follow up in 1 month   CLEGG,[name]  NP-C 11:05 AM  Patient seen and examined with Darrick Grinder, NP. We discussed all aspects of the encounter. I agree with the assessment and plan as stated above. Doing well on milrinone. Volume status mildly elevated. Agree with increasing diuretics for 2 days. Reinforced need for daily weights and reviewed use of sliding scale diuretics. Otherwise continue current regimen. He has failed milrinone wean in past.   Benay Spice 5:08 PM

## 2013-06-27 ENCOUNTER — Encounter (HOSPITAL_COMMUNITY): Payer: Medicare Other

## 2013-07-02 ENCOUNTER — Ambulatory Visit (INDEPENDENT_AMBULATORY_CARE_PROVIDER_SITE_OTHER): Payer: Medicare Other | Admitting: Pharmacist

## 2013-07-02 DIAGNOSIS — I4891 Unspecified atrial fibrillation: Secondary | ICD-10-CM

## 2013-07-02 LAB — POCT INR: INR: 2.5

## 2013-07-08 ENCOUNTER — Encounter: Payer: Self-pay | Admitting: Internal Medicine

## 2013-07-08 DIAGNOSIS — I5023 Acute on chronic systolic (congestive) heart failure: Secondary | ICD-10-CM

## 2013-07-08 DIAGNOSIS — I452 Bifascicular block: Secondary | ICD-10-CM

## 2013-07-08 DIAGNOSIS — I509 Heart failure, unspecified: Secondary | ICD-10-CM

## 2013-07-08 DIAGNOSIS — I4891 Unspecified atrial fibrillation: Secondary | ICD-10-CM

## 2013-07-09 ENCOUNTER — Telehealth (HOSPITAL_COMMUNITY): Payer: Self-pay | Admitting: Cardiology

## 2013-07-09 ENCOUNTER — Encounter: Payer: Self-pay | Admitting: Internal Medicine

## 2013-07-09 ENCOUNTER — Ambulatory Visit (INDEPENDENT_AMBULATORY_CARE_PROVIDER_SITE_OTHER): Payer: Medicare Other | Admitting: Cardiovascular Disease

## 2013-07-09 DIAGNOSIS — I4891 Unspecified atrial fibrillation: Secondary | ICD-10-CM

## 2013-07-09 LAB — POCT INR: INR: 2

## 2013-07-09 NOTE — Telephone Encounter (Signed)
[name] with [facility] called with concerns of labored breathing and increased abdominal girth- 99 cm (normally girth=97 cm) Vitals 110/62 pulse 80 pulse ox 95% RA weight 144 lb Would like to know if he can take extra Lasix and potassium tonight Labs drawn- office should be getting them via fax  Per Homer for pt to take extra meds, WILL NEED TO BE REASSESSED TOMORROW as he may need office visit  [name]-[facility] aware Will contact pt for med instructions Will also contact pt for re assessment  07/10/13

## 2013-07-15 ENCOUNTER — Encounter: Payer: Self-pay | Admitting: Internal Medicine

## 2013-07-17 ENCOUNTER — Ambulatory Visit (HOSPITAL_COMMUNITY)
Admission: RE | Admit: 2013-07-17 | Discharge: 2013-07-17 | Disposition: A | Discharge status: Home / Self Care | Payer: Medicare Other | Source: Ambulatory Visit | Attending: Internal Medicine | Admitting: Internal Medicine

## 2013-07-17 ENCOUNTER — Encounter (HOSPITAL_COMMUNITY): Payer: Self-pay

## 2013-07-17 VITALS — BP 96/54 | HR 95 | Wt 162.2 lb

## 2013-07-17 DIAGNOSIS — I509 Heart failure, unspecified: Secondary | ICD-10-CM | POA: Insufficient documentation

## 2013-07-17 DIAGNOSIS — I5022 Chronic systolic (congestive) heart failure: Secondary | ICD-10-CM

## 2013-07-17 NOTE — Progress Notes (Signed)
Patient ID: Tyler Christensen, male   DOB: 10-21-1932, 80 y.o.   MRN: [id number]  HPI: Tyler Christensen is a 80 y.o. [name] (non-english speaking) w/ PMHx significant for chronic systolic CHF (EF 26%), NICM, LBBB, PAF (on coumadin), HTN, HLD, CVA, Pulmonary Hypertension and h/o seizure d/o (2/2 CVA 05/2012).   Admitted to [facility] 06/2012 with acute decompensated heart failure EF 20%. As noted below, also diagnosed with severe pulmonary hypertension (PAPs ~ 90) from initial RHC and placed on Milrinone and Revatio (eventually transitioned to tadalafil 40) with marked clinical response and decrease in PA pressures. VQ low probability for pulmonary embolus.  Discharged on Milrinone at 0.375 mcg via PICC. Discharge Weight 146 pounds. Attempted to wean milrinone in 9/14 but failed due to worsening HF and PAH. Milrinone restarted.   08/14/12 S/P successful DC-CV of AF 10/26/12 S/P BiV-ICD implant 09/28/12 ECHO EF 15% 01/07/13 ECHO EF 20%, restrictive diastolic function, mildly decreased RV size and systolic fxn, mild to moderate MR, PA systolic pressure 59 mmHg  02/21/13 ECHO EF 20% PA pressure mean 59   RHC 02/21/13 (off milrinone) RA = 9 with v-waves to 20  RV = 72/8/20  PA = 69/27 (44)  PCW = 18-20  Fick cardiac output/index = 2.7/1.5  PVR = 9.6 woods  FA sat = 96%  PA sat = 48%, 52%  Follow up: Volume status elevated last visit and increased lasix to 80 mg daily for 2 days with extra KCL. Doing well. Denies CP, SOB, or PND. Takes extra lasix once a week. +orthopnea. He has been taking only 40 mg lasix daily instead of 40 mg BID.Weight at home 158-159 lbs. Taking medications as prescribed. Followed by [facility]. Following low salt diet and tries to limit fluid intake to less than 2 liters per day.    NO ACE-I 2/2 rash  Labs (7/14): HCT 39.7, K 4.6, creatinine 1.2          (8/26) HCT 37.7 K 4.3 Creatinine 1.6          (02/26/13) HCT 38.2, K 4.4, Creatinine 1.43 Mag 2.2         (10/14) K 4.5, creatinine 1.55    (04/09/13) K+  3.9, Cr 1.40    (04/30/13) K +4.0, Cr 1.64, Mag 2.1, pro-BNP 1057     (06/25/13) K 4.2 Creatinine 1.58 Magnesium 2.0     (07/16/13) K 4.2, Cr 1.67        SH: Lives with his wife here in [place].   ROS: All systems negative except as listed in HPI, PMH and Problem List.  Past Medical History  Diagnosis Date  . HTN (hypertension)   . High cholesterol   . CHF (congestive heart failure)   . BBBB (bilateral bundle branch block)   . LBBB (left bundle branch block)   . Weakness   . Tired   . Seizures 06/11/2012    new onset/notes (06/11/2012)  . SOB (shortness of breath)     "sometimes when I lay down;; related to not taking my RX" (06/11/2012)  . Myocardial infarction     06/10/2012  . Atrial fibrillation   . Stroke   . Atrial thrombus     left  . Pulmonary HTN     Current Outpatient Prescriptions  Medication Sig Dispense Refill  . carvedilol (COREG) 6.25 MG tablet Take 6.25 mg by mouth 2 (two) times daily with a meal. 3.125 mg q am and 6.25 mg q pm      .  Choline Fenofibrate (FENOFIBRIC ACID) 45 MG CPDR Take 1 tablet by mouth daily.      . furosemide (LASIX) 40 MG tablet TAKE 1 TABLET BY MOUTH TWICE DAILY  60 tablet  6  . loratadine (CLARITIN) 10 MG tablet TAKE 1 TABLET BY MOUTH ONCE DAILY  30 tablet  3  . losartan (COZAAR) 25 MG tablet Take 1 tablet (25 mg total) by mouth daily.  30 tablet  6  . magnesium oxide (MAG-OX) 400 MG tablet Take 1 tablet (400 mg total) by mouth daily.  30 tablet  6  . milrinone (PRIMACOR) 20 MG/100ML SOLN infusion Inject 0.125 mcg/kg/min into the vein continuous.      . potassium chloride SA (K-DUR,KLOR-CON) 20 MEQ tablet Take 1 tablet (20 mEq total) by mouth daily.  30 tablet  6  . simvastatin (ZOCOR) 40 MG tablet Take 40 mg by mouth every evening.      Marland Kitchen spironolactone (ALDACTONE) 25 MG tablet Take 1 tablet (25 mg total) by mouth daily.  30 tablet  6  . Tadalafil, PAH, 20 MG TABS Take 2 tablets (40 mg total) by mouth daily.  60 tablet  6  .  traMADol (ULTRAM) 50 MG tablet TAKE 1 TABLET BY MOUTH TWICE DAILY AS NEEDED FOR PAIN  60 tablet  2  . warfarin (COUMADIN) 4 MG tablet Take 1 tablet on all days except 1 and 1/2 tablets on Friday or as directed by coumadin clinic.  35 tablet  3   No current facility-administered medications for this encounter.    Filed Vitals:   07/17/13 1155  BP: 96/54  Pulse: 95  Weight: 162 lb 4 oz (73.596 kg)  SpO2: 97%   PHYSICAL EXAM: General:  Elderly, well appearing. NAD.No resp difficulty; interpreter present HEENT: normal Neck: supple. JVP 10-11  Carotids 2+ bilaterally; no bruits. No lymphadenopathy or thryomegaly appreciated. Cor: PMI laterally displaced. RRR. No s3 or murmur.  Lungs: clear Abdomen: soft, nontender, + minimally distended. No hepatosplenomegaly. No bruits or masses. Good bowel sounds. Extremities: no cyanosis, clubbing, rash,  Trace edema.  RUE PICC -  Neuro: alert & orientedx3, cranial nerves grossly intact. Moves all 4 extremities w/o difficulty. Affect pleasant.  ASSESSMENT & PLAN:  1. Chronic systolic CHF: NICM, EF 123456 (12/2012) s/p CRT-D.  - NYHA II symptoms and volume status stable. Will continue milrinone 0.25 mcg thru PICC did not tolerate wean.  - Volume status elevated, however has not been taking lasix 40 mg BID has only been taking 40 mg daily. Will give 2.5 mg metolazone today and have him take 120 mg of lasix tomorrow and then go back to 40 mg BID. He has been taking KCL 40 meq daily will increase tomorrow to 60 meq and then back to 40 meq daily. Translator present and relayed information. - Continue spironolactone 25 mg daily and coreg 6.25 mg q am and 3.125 mg q pm, will not titrate with history of low CO.  - Continue losartan to 25 mg daily. Will not increase due to soft BP.  - Not on Ace-I due to rash.  - Reviewed BMET, Cr slightly elevated but think his new baseline is going to be closer to 1.6-1.8. - Reinforced the need and importance of daily weights, a  low sodium diet, and fluid restriction (less than 2 L a day) through interpreter. Instructed to call the HF clinic if weight increases more than 3 lbs overnight or 5 lbs in a week. = 2. Paroxysmal atrial fibrillation: Continue  Coumadin and BB. Followed in Coumadin clinic 3. Pulmonary arterial hypertension: Noted on RHC 02/21/13.  Now on Adcirca 40 mg daily.  With no milrinone, PVR 9.6 WU and PA mean 44 mmHg. Milrinone was restarted and pressures improved. Continue Milrinone.  4. CKD - Stable. BMET weekly with [facility].  Follow up in 1 month   Junie Bame B  NP-C 12:04 PM  Patient seen and examined with Junie Bame, NP. We discussed all aspects of the encounter. I agree with the assessment and plan as stated above.  He continues to do quite well on chronic milrinone therapy. Volume status mildly elevated - agree with diuretic changes as above. Reinforced need for daily weights and reviewed use of sliding scale diuretics. Otherwise continue current regimen. He has failed milrinone wean in past.   Benay Spice 9:59 PM

## 2013-07-17 NOTE — Patient Instructions (Signed)
Tome 3 lasix maana y [employer] a 2 comprimidos al Training and development officer .  Tomar 3 tabletas de hoy de potasio y de [place].  Haciendo un gran  Seguimiento de 1 mes

## 2013-07-18 ENCOUNTER — Encounter (HOSPITAL_COMMUNITY): Payer: Medicare Other

## 2013-07-23 ENCOUNTER — Ambulatory Visit (INDEPENDENT_AMBULATORY_CARE_PROVIDER_SITE_OTHER): Payer: Medicare Other | Admitting: Internal Medicine

## 2013-07-23 DIAGNOSIS — I4891 Unspecified atrial fibrillation: Secondary | ICD-10-CM

## 2013-07-23 LAB — POCT INR: INR: 2.3

## 2013-07-30 ENCOUNTER — Telehealth (HOSPITAL_COMMUNITY): Payer: Self-pay

## 2013-07-30 NOTE — Telephone Encounter (Signed)
[name] from [facility] called concerning patient, stating he was more fatigued, increased abdominal girth, and harder work of breathing.  Weight trend remains the same as well as other vital signs.  Per patient, is taking furosemide 120 mg and 40 meq twice a week, and takes furosemide 80 mg and potassium 20 meq on the other days.  Junie Bame NP instructed [name] [facility] RN to give patient 1 extra furosemide and 1 extra potassium for the next 2 days, and call clinic Thursday with updates. Renee Pain

## 2013-08-02 ENCOUNTER — Telehealth (HOSPITAL_COMMUNITY): Payer: Self-pay | Admitting: Cardiology

## 2013-08-02 MED ORDER — METOLAZONE 2.5 MG PO TABS: 2.5000 mg | ORAL_TABLET | ORAL | Status: DC

## 2013-08-02 NOTE — Telephone Encounter (Signed)
[name] called with concerns of increased abdominal girth 103- normal for him his 92-98 Weight stable 143 pt is nor eating very much Pt C/O increased  Fatigue and SOB Pt is still taking Lasix # 3 pills (120 mg) at least three times a week, when he does this he will take Potassium #2 pills (40 mEq)  [name] would like to know if we should just keep him on the dose all the time Also would like to know if we need to add Metolozone weekly to his med's list   Please advise

## 2013-08-02 NOTE — Telephone Encounter (Signed)
Discussed w/[name] Ninfa [facility], NP will give pt metolazone 2.5 mg on Sat and Tue with extra KCL, Amiee, RN with [facility] aware and will notify pt, rx sent in, Amiee will visit with pt again on Tue

## 2013-08-05 ENCOUNTER — Encounter: Payer: Self-pay | Admitting: Internal Medicine

## 2013-08-08 ENCOUNTER — Encounter: Payer: Self-pay | Admitting: Internal Medicine

## 2013-08-12 ENCOUNTER — Ambulatory Visit (INDEPENDENT_AMBULATORY_CARE_PROVIDER_SITE_OTHER): Payer: Medicare Other | Admitting: Cardiology

## 2013-08-12 ENCOUNTER — Other Ambulatory Visit: Payer: Self-pay | Admitting: Internal Medicine

## 2013-08-12 DIAGNOSIS — I4891 Unspecified atrial fibrillation: Secondary | ICD-10-CM

## 2013-08-12 LAB — POCT INR: INR: 2.1

## 2013-08-13 ENCOUNTER — Telehealth (HOSPITAL_COMMUNITY): Payer: Self-pay | Admitting: *Deleted

## 2013-08-13 NOTE — Telephone Encounter (Signed)
Received labs from [facility] bun 54, cr 1.97 per Junie Bame, NP hold lasix for 1 day, called and spoke w/pt's RN Amiee, she states pt seems stable wt is 143 which is good she will call pt and advise him to hold lasix tomorrow, she will recheck labs on Tue 2/24

## 2013-08-14 ENCOUNTER — Telehealth (HOSPITAL_COMMUNITY): Payer: Self-pay

## 2013-08-14 NOTE — Telephone Encounter (Signed)
Left message for [facility] RN to relay message to patient regarding cancellation of tomorrow's appointment for upcoming inclement weather.  Asked her to call us back to let us know he received message.  Will mail spanish letter in mail with new appointment reminder.

## 2013-08-14 NOTE — Telephone Encounter (Signed)
Attempted to reschedule tomorrow's appointment due to upcoming inclement weather, pt did not answer, not voicemail box available, will reattempt later today.

## 2013-08-15 ENCOUNTER — Encounter (HOSPITAL_COMMUNITY): Payer: Medicare Other

## 2013-08-16 ENCOUNTER — Ambulatory Visit (HOSPITAL_COMMUNITY)
Admission: RE | Admit: 2013-08-16 | Discharge: 2013-08-16 | Disposition: A | Discharge status: Home / Self Care | Payer: Medicare Other | Source: Ambulatory Visit | Attending: Cardiology | Admitting: Cardiology

## 2013-08-16 ENCOUNTER — Encounter (HOSPITAL_COMMUNITY): Payer: Self-pay

## 2013-08-16 VITALS — BP 98/52 | HR 84 | Wt 159.1 lb

## 2013-08-16 DIAGNOSIS — I5022 Chronic systolic (congestive) heart failure: Secondary | ICD-10-CM | POA: Insufficient documentation

## 2013-08-16 DIAGNOSIS — I4891 Unspecified atrial fibrillation: Secondary | ICD-10-CM

## 2013-08-16 DIAGNOSIS — I509 Heart failure, unspecified: Secondary | ICD-10-CM

## 2013-08-16 DIAGNOSIS — I2789 Other specified pulmonary heart diseases: Secondary | ICD-10-CM | POA: Insufficient documentation

## 2013-08-16 DIAGNOSIS — I2721 Secondary pulmonary arterial hypertension: Secondary | ICD-10-CM

## 2013-08-16 MED ORDER — POTASSIUM CHLORIDE CRYS ER 20 MEQ PO TBCR
20.0000 meq | EXTENDED_RELEASE_TABLET | Freq: Every day | ORAL | Status: DC
Start: 2013-08-16 — End: 2013-09-13

## 2013-08-16 NOTE — Progress Notes (Signed)
Interpreter Lariyah Shetterly Namihira for [facility] °

## 2013-08-16 NOTE — Progress Notes (Signed)
Patient ID: Kindred Reidinger, male   DOB: 09/24/1932, 80 y.o.   MRN: [id number]  HPI: Mr. Vivas is a 80 y.o. [name] (non-english speaking) w/ PMHx significant for chronic systolic CHF (EF 66%), NICM, LBBB, PAF (on coumadin), HTN, HLD, CVA, Pulmonary Hypertension and h/o seizure d/o (2/2 CVA 05/2012).   Admitted to [facility] 06/2012 with acute decompensated heart failure EF 20%. As noted below, also diagnosed with severe pulmonary hypertension (PAPs ~ 90) from initial RHC and placed on Milrinone and Revatio (eventually transitioned to tadalafil 40) with marked clinical response and decrease in PA pressures. VQ low probability for pulmonary embolus.  Discharged on Milrinone at 0.375 mcg via PICC. Discharge Weight 146 pounds. Attempted to wean milrinone in 9/14 but failed due to worsening HF and PAH. Milrinone restarted.   08/14/12 S/P successful DC-CV of AF 10/26/12 S/P BiV-ICD implant 09/28/12 ECHO EF 15% 01/07/13 ECHO EF 20%, restrictive diastolic function, mildly decreased RV size and systolic fxn, mild to moderate MR, PA systolic pressure 59 mmHg  02/21/13 ECHO EF 20% PA pressure mean 59   RHC 02/21/13 (off milrinone) RA = 9 with v-waves to 20  RV = 72/8/20  PA = 69/27 (44)  PCW = 18-20  Fick cardiac output/index = 2.7/1.5  PVR = 9.6 woods  FA sat = 96%  PA sat = 48%, 52%  Follow up: Mr Mallis is feeling good today.  He has started taking metolazone twice a week on Tuesdays and Saturdays.  On days he takes metolazone, he is taking Lasix 80 mg qam, 40 qpm.  On other days, he is taking Lasix 40 mg bid.  Weight is down 3 lbs.  Creatinine is up a bit to 1.94 but this has been stable for a couple of weeks.  He can walk 10-15 minutes on flat ground.  Minimal abdominal swelling.  No orthopnea or PND.  No chest pain.   NO ACE-I 2/2 rash  Labs (7/14): HCT 39.7, K 4.6, creatinine 1.2          (8/26) HCT 37.7 K 4.3 Creatinine 1.6          (02/26/13) HCT 38.2, K 4.4, Creatinine 1.43 Mag 2.2         (10/14) K 4.5,  creatinine 1.55    (04/09/13) K+ 3.9, Cr 1.40    (04/30/13) K +4.0, Cr 1.64, Mag 2.1, pro-BNP 1057     (06/25/13) K 4.2 Creatinine 1.58 Magnesium 2.0     (07/16/13) K 4.2, Cr 1.67    (2/15) creatinine 1.97 => 1.94, K 4.6, HCT 39.4        SH: Lives with his wife here in [place].   ROS: All systems negative except as listed in HPI, PMH and Problem List.  Past Medical History  Diagnosis Date  . HTN (hypertension)   . High cholesterol   . CHF (congestive heart failure)   . BBBB (bilateral bundle branch block)   . LBBB (left bundle branch block)   . Weakness   . Tired   . Seizures 06/11/2012    new onset/notes (06/11/2012)  . SOB (shortness of breath)     "sometimes when I lay down;; related to not taking my RX" (06/11/2012)  . Myocardial infarction     06/10/2012  . Atrial fibrillation   . Stroke   . Atrial thrombus     left  . Pulmonary HTN     Current Outpatient Prescriptions  Medication Sig Dispense Refill  . carvedilol (COREG) 6.25 MG  tablet 3.125 mg q am and 6.25 mg q pm      . Choline Fenofibrate (FENOFIBRIC ACID) 45 MG CPDR Take 1 tablet by mouth daily.      . furosemide (LASIX) 40 MG tablet TAKE 1 TABLET BY MOUTH TWICE DAILY  60 tablet  6  . loratadine (CLARITIN) 10 MG tablet TAKE 1 TABLET BY MOUTH DAILY TAKE 1 TABLET BY MOUTH DAILY  30 tablet  6  . losartan (COZAAR) 25 MG tablet Take 1 tablet (25 mg total) by mouth daily.  30 tablet  6  . magnesium oxide (MAG-OX) 400 MG tablet Take 1 tablet (400 mg total) by mouth daily.  30 tablet  6  . metolazone (ZAROXOLYN) 2.5 MG tablet Take 1 tablet (2.5 mg total) by mouth 2 (two) times a week. Every Sat and Tue  10 tablet  3  . milrinone (PRIMACOR) 20 MG/100ML SOLN infusion Inject 0.125 mcg/kg/min into the vein continuous.      . potassium chloride SA (K-DUR,KLOR-CON) 20 MEQ tablet Take 1 tablet (20 mEq total) by mouth daily. TAKE EXTRA TAB WHEN YOU TAKE METOLAZONE  30 tablet  6  . simvastatin (ZOCOR) 40 MG tablet Take 40 mg by  mouth every evening.      Marland Kitchen spironolactone (ALDACTONE) 25 MG tablet Take 1 tablet (25 mg total) by mouth daily.  30 tablet  6  . Tadalafil, PAH, 20 MG TABS Take 2 tablets (40 mg total) by mouth daily.  60 tablet  6  . traMADol (ULTRAM) 50 MG tablet TAKE 1 TABLET BY MOUTH TWICE DAILY AS NEEDED FOR PAIN  60 tablet  2  . warfarin (COUMADIN) 4 MG tablet Take 1 tablet on all days except 1 and 1/2 tablets on Friday or as directed by coumadin clinic.  35 tablet  3   No current facility-administered medications for this encounter.    Filed Vitals:   08/16/13 1026  BP: 98/52  Pulse: 84  Weight: 159 lb 1.9 oz (72.176 kg)  SpO2: 97%   PHYSICAL EXAM: General:  Elderly, well appearing. NAD.No resp difficulty; interpreter present HEENT: normal Neck: supple. No JVD.  Carotids 2+ bilaterally; no bruits. No lymphadenopathy or thryomegaly appreciated. Cor: PMI laterally displaced. RRR. No s3 or murmur.  Lungs: clear Abdomen: soft, nontender, + minimally distended. No hepatosplenomegaly. No bruits or masses. Good bowel sounds. Extremities: no cyanosis, clubbing, rash. No ankle edema.  RUE PICC  Neuro: alert & orientedx3, cranial nerves grossly intact. Moves all 4 extremities w/o difficulty. Affect pleasant.  ASSESSMENT & PLAN:  1. Chronic systolic CHF: NICM, EF 123456 (12/2012) s/p CRT-D.  NYHA II symptoms and volume status looks pretty good (no JVD). Weight down 3 lbs with addition of metolazone.  Creatinine a little higher at 1.9 but has been stable.  - Will continue milrinone 0.25 mcg thru PICC did not tolerate wean.  - Continue current diuretics with Lasix 40 mg bid + metolazone biw.  - Continue spironolactone 25 mg daily and coreg 6.25 mg q am and 3.125 mg q pm - Continue losartan to 25 mg daily. Will not increase due to soft BP.  - Not on Ace-I due to rash.  - Will check BMET in 1 week to follow K and creatinine.  - Reinforced the need and importance of daily weights, a low sodium diet, and fluid  restriction (less than 2 L a day) through interpreter. Instructed to call the HF clinic if weight increases more than 3 lbs overnight or 5  lbs in a week. = 2. Paroxysmal atrial fibrillation: Continue Coumadin and BB. Followed in Coumadin clinic 3. Pulmonary arterial hypertension: Noted on RHC 02/21/13.  Now on Adcirca 40 mg daily.  With no milrinone, PVR 9.6 WU and PA mean 44 mmHg. Milrinone was restarted since that time. 4. CKD: Follow closely with metolazone use. BMET weekly with [facility].  Follow up in 1 month   [name]   08/16/2013

## 2013-08-16 NOTE — Patient Instructions (Signed)
Take extra 20 meq of Potassium when you take Metolazone  Your physician recommends that you schedule a follow-up appointment in: 1 month

## 2013-08-22 ENCOUNTER — Encounter: Payer: Self-pay | Admitting: Internal Medicine

## 2013-09-02 ENCOUNTER — Telehealth (HOSPITAL_COMMUNITY): Payer: Self-pay

## 2013-09-03 ENCOUNTER — Other Ambulatory Visit (HOSPITAL_COMMUNITY): Payer: Self-pay | Admitting: Anesthesiology

## 2013-09-03 ENCOUNTER — Encounter: Payer: Self-pay | Admitting: Internal Medicine

## 2013-09-04 ENCOUNTER — Encounter: Payer: Self-pay | Admitting: Internal Medicine

## 2013-09-06 ENCOUNTER — Encounter: Payer: Self-pay | Admitting: Internal Medicine

## 2013-09-06 DIAGNOSIS — I452 Bifascicular block: Secondary | ICD-10-CM

## 2013-09-06 DIAGNOSIS — I4891 Unspecified atrial fibrillation: Secondary | ICD-10-CM

## 2013-09-06 DIAGNOSIS — I5023 Acute on chronic systolic (congestive) heart failure: Secondary | ICD-10-CM

## 2013-09-06 DIAGNOSIS — I509 Heart failure, unspecified: Secondary | ICD-10-CM

## 2013-09-10 ENCOUNTER — Telehealth (HOSPITAL_COMMUNITY): Payer: Self-pay | Admitting: Adult Health

## 2013-09-10 ENCOUNTER — Ambulatory Visit (INDEPENDENT_AMBULATORY_CARE_PROVIDER_SITE_OTHER): Payer: Medicare Other | Admitting: Pharmacist Clinician (PhC)/ Clinical Pharmacy Specialist

## 2013-09-10 DIAGNOSIS — I4891 Unspecified atrial fibrillation: Secondary | ICD-10-CM

## 2013-09-10 LAB — POCT INR: INR: 2.7

## 2013-09-10 NOTE — Telephone Encounter (Signed)
Spoke with Sophiea Ueda RN from [facility].   She reported increased abdominal girth. However he just took metolazone and has good urine output.  Instructed to continue current diuretic regimen.   Vallarie Fei  NP-C   1:24 PM

## 2013-09-11 ENCOUNTER — Telehealth (HOSPITAL_COMMUNITY): Payer: Self-pay | Admitting: Cardiology

## 2013-09-11 MED ORDER — FUROSEMIDE 40 MG PO TABS: ORAL_TABLET | ORAL | Status: DC

## 2013-09-11 NOTE — Telephone Encounter (Signed)
Labs reviewed bun 56 cr 1.96 K 4.1 per Darrick Grinder, NP increase lasix to 80 mg in AM and 40 mg in PM, Amiee is aware and will let pt know, he has f/u appt on Fri

## 2013-09-11 NOTE — Telephone Encounter (Signed)
[name] called to report pts weight is still elevates this am 144 Pt does have increased SOB Labs are pending  [name] is willing to give IV medsor assist with med titration via interpreter if needed  Please advise

## 2013-09-13 ENCOUNTER — Encounter (HOSPITAL_COMMUNITY): Payer: Self-pay

## 2013-09-13 ENCOUNTER — Encounter: Payer: Self-pay | Admitting: Internal Medicine

## 2013-09-13 ENCOUNTER — Ambulatory Visit (HOSPITAL_COMMUNITY)
Admission: RE | Admit: 2013-09-13 | Discharge: 2013-09-13 | Disposition: A | Discharge status: Home / Self Care | Payer: Medicare Other | Source: Ambulatory Visit | Attending: Internal Medicine | Admitting: Internal Medicine

## 2013-09-13 VITALS — BP 139/71 | HR 84 | Resp 18 | Wt 164.2 lb

## 2013-09-13 DIAGNOSIS — Z95810 Presence of automatic (implantable) cardiac defibrillator: Secondary | ICD-10-CM

## 2013-09-13 DIAGNOSIS — I428 Other cardiomyopathies: Secondary | ICD-10-CM | POA: Insufficient documentation

## 2013-09-13 DIAGNOSIS — I2789 Other specified pulmonary heart diseases: Secondary | ICD-10-CM | POA: Insufficient documentation

## 2013-09-13 DIAGNOSIS — I5022 Chronic systolic (congestive) heart failure: Secondary | ICD-10-CM | POA: Insufficient documentation

## 2013-09-13 DIAGNOSIS — Z7901 Long term (current) use of anticoagulants: Secondary | ICD-10-CM | POA: Insufficient documentation

## 2013-09-13 DIAGNOSIS — I2721 Secondary pulmonary arterial hypertension: Secondary | ICD-10-CM

## 2013-09-13 NOTE — Progress Notes (Signed)
Patient ID: Kyri Dai, male   DOB: 01/17/1933, 80 y.o.   MRN: [id number]  HPI: Mr. Teichert is a 80 y.o. [name] (non-english speaking) w/ PMHx significant for chronic systolic CHF (EF 32%), NICM, LBBB, PAF (on coumadin), HTN, HLD, CVA, Pulmonary Hypertension and h/o seizure d/o (2/2 CVA 05/2012).   Admitted to [facility] 06/2012 with acute decompensated heart failure EF 20%. As noted below, also diagnosed with severe pulmonary hypertension (PAPs ~ 90) from initial RHC and placed on Milrinone and Revatio (eventually transitioned to tadalafil 40) with marked clinical response and decrease in PA pressures. VQ low probability for pulmonary embolus.  Discharged on Milrinone at 0.375 mcg via PICC. Discharge Weight 146 pounds. Attempted to wean milrinone in 9/14 but failed due to worsening HF and PAH. Milrinone restarted.   08/14/12 S/P successful DC-CV of AF 10/26/12 S/P BiV-ICD implant 09/28/12 ECHO EF 15% 01/07/13 ECHO EF 20%, restrictive diastolic function, mildly decreased RV size and systolic fxn, mild to moderate MR, PA systolic pressure 59 mmHg  02/21/13 ECHO EF 20% PA pressure mean 59   RHC 02/21/13 (off milrinone) RA = 9 with v-waves to 20  RV = 72/8/20  PA = 69/27 (44)  PCW = 18-20  Fick cardiac output/index = 2.7/1.5  PVR = 9.6 woods  FA sat = 96%  PA sat = 48%, 52%  Follow up: Mr Mofield is feeling good today.  He has started taking metolazone twice a week on Tuesdays and Saturdays.  On days he takes metolazone, he is taking Lasix 80 mg qam, 40 qpm.  On other days, he is taking Lasix 40 mg bid.  Weight is down 3 lbs.  Creatinine is up a bit to 1.94 but this has been stable for a couple of weeks.  He can walk 10-15 minutes on flat ground.  Minimal abdominal swelling.  No orthopnea or PND.  No chest pain.   NO ACE-I 2/2 rash  Labs (7/14): HCT 39.7, K 4.6, creatinine 1.2          (8/26) HCT 37.7 K 4.3 Creatinine 1.6          (02/26/13) HCT 38.2, K 4.4, Creatinine 1.43 Mag 2.2         (10/14) K 4.5,  creatinine 1.55    (04/09/13) K+ 3.9, Cr 1.40    (04/30/13) K +4.0, Cr 1.64, Mag 2.1, pro-BNP 1057     (06/25/13) K 4.2 Creatinine 1.58 Magnesium 2.0     (07/16/13) K 4.2, Cr 1.67    (2/15) creatinine 1.97 => 1.94, K 4.6, HCT 39.4        SH: Lives with his wife here in [place].   ROS: All systems negative except as listed in HPI, PMH and Problem List.  Past Medical History  Diagnosis Date  . HTN (hypertension)   . High cholesterol   . CHF (congestive heart failure)   . BBBB (bilateral bundle branch block)   . LBBB (left bundle branch block)   . Weakness   . Tired   . Seizures 06/11/2012    new onset/notes (06/11/2012)  . SOB (shortness of breath)     "sometimes when I lay down;; related to not taking my RX" (06/11/2012)  . Myocardial infarction     06/10/2012  . Atrial fibrillation   . Stroke   . Atrial thrombus     left  . Pulmonary HTN     Current Outpatient Prescriptions  Medication Sig Dispense Refill  . carvedilol (COREG) 6.25 MG  tablet 3.125 mg q am and 6.25 mg q pm      . Choline Fenofibrate (FENOFIBRIC ACID) 45 MG CPDR Take 1 tablet by mouth daily.      . furosemide (LASIX) 40 MG tablet 80 mg 2 (two) times daily. Take 80 mg (2 tabs) in AM and 40 mg (1 tab) in PM      . loratadine (CLARITIN) 10 MG tablet TAKE 1 TABLET BY MOUTH DAILY TAKE 1 TABLET BY MOUTH DAILY  30 tablet  6  . losartan (COZAAR) 25 MG tablet Take 12.5 mg by mouth 2 (two) times daily.      . magnesium oxide (MAG-OX) 400 MG tablet Take 1 tablet (400 mg total) by mouth daily.  30 tablet  6  . metolazone (ZAROXOLYN) 2.5 MG tablet Take 1 tablet (2.5 mg total) by mouth 2 (two) times a week. Every Sat and Tue  10 tablet  3  . milrinone (PRIMACOR) 20 MG/100ML SOLN infusion Inject 0.125 mcg/kg/min into the vein continuous.      . potassium chloride SA (K-DUR,KLOR-CON) 20 MEQ tablet Take 20 mEq by mouth daily. TAKE EXTRA TAB WHEN YOU TAKE METOLAZONE      . spironolactone (ALDACTONE) 25 MG tablet Take 1 tablet  (25 mg total) by mouth daily.  30 tablet  6  . Tadalafil, PAH, 20 MG TABS Take 2 tablets (40 mg total) by mouth daily.  60 tablet  6  . traMADol (ULTRAM) 50 MG tablet TAKE 1 TABLET BY MOUTH TWICE DAILY AS NEEDED FOR PAIN  60 tablet  1  . warfarin (COUMADIN) 4 MG tablet Take 1 tablet on all days except 1 and 1/2 tablets on Friday or as directed by coumadin clinic.  35 tablet  3  . simvastatin (ZOCOR) 40 MG tablet Take 40 mg by mouth every evening.       No current facility-administered medications for this encounter.    Filed Vitals:   09/13/13 1041  BP: 139/71  Pulse: 84  Resp: 18  Weight: 164 lb 4 oz (74.503 kg)  SpO2: 98%   PHYSICAL EXAM: General:  Elderly, well appearing. NAD.No resp difficulty; interpreter present HEENT: normal Neck: supple. JVD 7-8  Carotids 2+ bilaterally; no bruits. No lymphadenopathy or thryomegaly appreciated. Cor: PMI laterally displaced. RRR. No s3 or murmur.  Lungs: clear Abdomen: soft, nontender, + minimally distended. No hepatosplenomegaly. No bruits or masses. Good bowel sounds. Extremities: no cyanosis, clubbing, rash. No ankle edema.  RUE PICC  Neuro: alert & orientedx3, cranial nerves grossly intact. Moves all 4 extremities w/o difficulty. Affect pleasant.  ASSESSMENT & PLAN:  1. Chronic systolic CHF: NICM, EF 25% (12/2012) s/p CRT-D.   - NYHA II symptoms and volume status looks mildly elevated. Weight is up 5 lbs from last visit, but he reports no symptoms and volume status only up slightly. His Cr continues to trend up and he increased his lasix on his own to 80 mg BID (continue) along with metolazone 2.5 mg on Tuesdays and Saturdays. Not exactly sure if he is taking medications correctly, translator not present and used google translate to go over meds. - Will continue milrinone 0.25 mcg thru PICC did not tolerate wean.  - Continue spironolactone 25 mg daily (may need to cut back if Cr continues to increase) and coreg 6.25 mg BID.  - Continue  losartan to 25 mg daily.   - Not on Ace-I due to rash.  - BMET weekely with [facility] - Reinforced the need and  importance of daily weights, a low sodium diet, and fluid restriction (less than 2 L a day) through interpreter. Instructed to call the HF clinic if weight increases more than 3 lbs overnight or 5 lbs in a week. = 2. Paroxysmal atrial fibrillation: Continue Coumadin and BB. Followed in Coumadin clinic 3. Pulmonary arterial hypertension: Noted on RHC 02/21/13.  Now on Adcirca 40 mg daily.  With no milrinone, PVR 9.6 WU and PA mean 44 mmHg. Milrinone was restarted since that time. 4. CKD: Follow closely with metolazone use. BMET weekly with [facility].  Follow up in 1 month   Rande Brunt   09/13/2013

## 2013-09-13 NOTE — Patient Instructions (Signed)
Siga todos los medicamentos  Llame a cualquier problema [phone]  El seguimiento en 3-4 semanas

## 2013-10-01 ENCOUNTER — Telehealth (HOSPITAL_COMMUNITY): Payer: Self-pay | Admitting: Cardiology

## 2013-10-01 ENCOUNTER — Encounter: Payer: Self-pay | Admitting: Anesthesiology

## 2013-10-01 NOTE — Telephone Encounter (Signed)
[name] CALLED DURING HH VISIT WITH CONCERNS  WEIGHT UP X 1 LB- 144 LBS  B/P 100/60 HR 76 IRREGULAR NO LE EDEMA LUNGS CLEAR INCREASED SOB,WEAKNESS AND C/O DIZZINESS, DECREASED APPETITE  ABDOMINAL GIRTH 104 NORMALLY 96-100  PER VO ALI COSGROVE, NP INCREASE MILRINONE TO .375 INCREASE LASIX TO 40 MG TWICE A DAY INCREASE POTASSIUM TO 40 MeQ WITH INCREASED DOSE OF LASIX HH TO RETURN LATER IN THE WEEK FOR FOLLOW UP/RE CHECK  [name], RN WITH [facility] AWARE

## 2013-10-02 NOTE — Telephone Encounter (Signed)
Addendum- lasix dose should be 80 mg twice  A day  Labs reviewed today and pt should stay on this dose until follow up appt per Deatra Canter Cosgrove,NP

## 2013-10-04 ENCOUNTER — Encounter (HOSPITAL_COMMUNITY): Payer: Medicare Other

## 2013-10-04 ENCOUNTER — Telehealth (HOSPITAL_COMMUNITY): Payer: Self-pay | Admitting: *Deleted

## 2013-10-04 MED ORDER — FUROSEMIDE 40 MG PO TABS: 80.0000 mg | ORAL_TABLET | Freq: Two times a day (BID) | ORAL | Status: DC

## 2013-10-04 MED ORDER — POTASSIUM CHLORIDE CRYS ER 20 MEQ PO TBCR
40.0000 meq | EXTENDED_RELEASE_TABLET | Freq: Every day | ORAL | Status: DC
Start: 2013-10-04 — End: 2013-11-28

## 2013-10-04 MED ORDER — MILRINONE IN DEXTROSE 20 MG/100ML IV SOLN: 0.2500 ug/kg/min | INTRAVENOUS | Status: DC

## 2013-10-04 NOTE — Telephone Encounter (Signed)
Amiee, RN with [facility] called concerned about pt's HR she states today it is running 110s which is new for him, he states he does not feel heart racing or palps, breathing is improved since Tue when lasix and milrinone were both increased, Amiee states HR seems regular, discussed w/Ali Boyce Medici, NP will decrease Milrinone back to 0.25, if pt becomes symptomatic he will go to ER, Amiee will see him again on Tue and he will see [name] on Thur

## 2013-10-08 ENCOUNTER — Telehealth (HOSPITAL_COMMUNITY): Payer: Self-pay | Admitting: Anesthesiology

## 2013-10-08 ENCOUNTER — Encounter: Payer: Self-pay | Admitting: Anesthesiology

## 2013-10-08 LAB — POCT INR: INR: 2.41

## 2013-10-08 MED ORDER — FUROSEMIDE 40 MG PO TABS: ORAL_TABLET | ORAL | Status: DC

## 2013-10-08 NOTE — Telephone Encounter (Signed)
Reviewed patient labs:  Cr 2.39 up from 1.56 K+ 3.9.   Will hold lasix for two days and then change back to 80 mg q am and 40 mg q pm.   Discussed with [name] HHRN and she will relay to patient. Will see him in clinic on Thursday.  Rande Brunt NP-C 4:19 PM

## 2013-10-09 ENCOUNTER — Encounter: Payer: Self-pay | Admitting: Internal Medicine

## 2013-10-09 ENCOUNTER — Ambulatory Visit (INDEPENDENT_AMBULATORY_CARE_PROVIDER_SITE_OTHER): Payer: Medicare Other | Admitting: Cardiovascular Disease

## 2013-10-09 DIAGNOSIS — I4891 Unspecified atrial fibrillation: Secondary | ICD-10-CM

## 2013-10-10 ENCOUNTER — Ambulatory Visit (HOSPITAL_COMMUNITY)
Admission: RE | Admit: 2013-10-10 | Discharge: 2013-10-10 | Disposition: A | Discharge status: Home / Self Care | Payer: Medicare Other | Source: Ambulatory Visit | Attending: Cardiology | Admitting: Cardiology

## 2013-10-10 VITALS — BP 110/52 | HR 74 | Wt 165.1 lb

## 2013-10-10 DIAGNOSIS — Z7901 Long term (current) use of anticoagulants: Secondary | ICD-10-CM | POA: Insufficient documentation

## 2013-10-10 DIAGNOSIS — I5022 Chronic systolic (congestive) heart failure: Secondary | ICD-10-CM | POA: Insufficient documentation

## 2013-10-10 DIAGNOSIS — I2721 Secondary pulmonary arterial hypertension: Secondary | ICD-10-CM

## 2013-10-10 DIAGNOSIS — I279 Pulmonary heart disease, unspecified: Secondary | ICD-10-CM | POA: Insufficient documentation

## 2013-10-10 DIAGNOSIS — I428 Other cardiomyopathies: Secondary | ICD-10-CM | POA: Insufficient documentation

## 2013-10-10 DIAGNOSIS — I4891 Unspecified atrial fibrillation: Secondary | ICD-10-CM

## 2013-10-10 DIAGNOSIS — I2789 Other specified pulmonary heart diseases: Secondary | ICD-10-CM

## 2013-10-10 MED ORDER — TORSEMIDE 20 MG PO TABS: 40.0000 mg | ORAL_TABLET | Freq: Two times a day (BID) | ORAL | Status: DC

## 2013-10-10 NOTE — Progress Notes (Signed)
Patient ID: Tyler Christensen, male   DOB: Aug 29, 1932, 80 y.o.   MRN: [id number]  HPI: Tyler Christensen is a 80 y.o. [name] (non-english speaking) w/ PMHx significant for chronic systolic CHF (EF 40%), NICM, LBBB, PAF (on coumadin), HTN, HLD, CVA, Pulmonary Hypertension and h/o seizure d/o (2/2 CVA 05/2012).   Admitted to [facility] 06/2012 with acute decompensated heart failure EF 20%. As noted below, also diagnosed with severe pulmonary hypertension (PAPs ~ 90) from initial RHC and placed on Milrinone and Revatio (eventually transitioned to tadalafil 40) with marked clinical response and decrease in PA pressures. VQ low probability for pulmonary embolus.  Discharged on Milrinone at 0.375 mcg via PICC. Discharge Weight 146 pounds. Attempted to wean milrinone in 9/14 but failed due to worsening HF and PAH. Milrinone restarted.   08/14/12 S/P successful DC-CV of AF 10/26/12 S/P BiV-ICD implant 09/28/12 ECHO EF 15% 01/07/13 ECHO EF 20%, restrictive diastolic function, mildly decreased RV size and systolic fxn, mild to moderate MR, PA systolic pressure 59 mmHg  02/21/13 ECHO EF 20% PA pressure mean 59   RHC 02/21/13 (off milrinone) RA = 9 with v-waves to 20  RV = 72/8/20  PA = 69/27 (44)  PCW = 18-20  Fick cardiac output/index = 2.7/1.5  PVR = 9.6 woods  FA sat = 96%  PA sat = 48%, 52%  Follow up: Since last visit multiple calls from [facility] RN about increased SOB, increased abdominal girth and increased weight. His diuretics were increased along with his milrinone to 0.375. With increase in milrinone his HR increased and was irregular per HHRN, milrinone decreased back to 0.25. Cr is increasing and is up from 1.56 to 2.39. He held lasix for 2 days along with his potassium. Reports he is SOB with minimal activity and his weight is increasing. Denies CP, LE edema or orthopnea. Reports his SOB improved with his increased diuretics. +palpitations. Following a low salt diet and drinking less than 2L a day. Weight has increased 12 lbs  in past week. With translators help found out patient has been eating chicken broth for past couple of days, he did not know it had a lot of salt in it.   NO ACE-I 2/2 rash  Labs (7/14): HCT 39.7, K 4.6, creatinine 1.2          (8/26) HCT 37.7 K 4.3 Creatinine 1.6          (02/26/13) HCT 38.2, K 4.4, Creatinine 1.43 Mag 2.2         (10/14) K 4.5, creatinine 1.55    (04/09/13) K+ 3.9, Cr 1.40    (04/30/13) K +4.0, Cr 1.64, Mag 2.1, pro-BNP 1057     (06/25/13) K 4.2 Creatinine 1.58 Magnesium 2.0     (07/16/13) K 4.2, Cr 1.67    (2/15) creatinine 1.97 => 1.94, K 4.6, HCT 39.4    (4/15) K 4.5, creatinine 1.56, pro-BNP 6912    (4/15) K 3.9, creatinine 2.39, mag 2.1          SH: Lives with his wife here in [place].   ROS: All systems negative except as listed in HPI, PMH and Problem List.  Past Medical History  Diagnosis Date  . HTN (hypertension)   . High cholesterol   . CHF (congestive heart failure)   . BBBB (bilateral bundle branch block)   . LBBB (left bundle branch block)   . Weakness   . Tired   . Seizures 06/11/2012    new onset/notes (06/11/2012)  .  SOB (shortness of breath)     "sometimes when I lay down;; related to not taking my RX" (06/11/2012)  . Myocardial infarction     06/10/2012  . Atrial fibrillation   . Stroke   . Atrial thrombus     left  . Pulmonary HTN     Current Outpatient Prescriptions  Medication Sig Dispense Refill  . carvedilol (COREG) 6.25 MG tablet 6.25 mg 2 (two) times daily with a meal.       . Choline Fenofibrate (FENOFIBRIC ACID) 45 MG CPDR Take 1 tablet by mouth daily.      . furosemide (LASIX) 40 MG tablet 80 mg 2 (two) times daily.      Marland Kitchen loratadine (CLARITIN) 10 MG tablet TAKE 1 TABLET BY MOUTH DAILY TAKE 1 TABLET BY MOUTH DAILY  30 tablet  6  . losartan (COZAAR) 25 MG tablet Take 12.5 mg by mouth 2 (two) times daily.      . magnesium oxide (MAG-OX) 400 MG tablet Take 1 tablet (400 mg total) by mouth daily.  30 tablet  6  . metolazone  (ZAROXOLYN) 2.5 MG tablet Take 1 tablet (2.5 mg total) by mouth 2 (two) times a week. Every Sat and Tue  10 tablet  3  . milrinone (PRIMACOR) 20 MG/100ML SOLN infusion Inject 16.95 mcg/min into the vein continuous.  100 mL    . potassium chloride SA (K-DUR,KLOR-CON) 20 MEQ tablet Take 2 tablets (40 mEq total) by mouth daily. TAKE EXTRA TAB WHEN YOU TAKE METOLAZONE  75 tablet  3  . simvastatin (ZOCOR) 40 MG tablet Take 40 mg by mouth every evening.      Marland Kitchen spironolactone (ALDACTONE) 25 MG tablet Take 1 tablet (25 mg total) by mouth daily.  30 tablet  6  . Tadalafil, PAH, 20 MG TABS Take 2 tablets (40 mg total) by mouth daily.  60 tablet  6  . traMADol (ULTRAM) 50 MG tablet TAKE 1 TABLET BY MOUTH TWICE DAILY AS NEEDED FOR PAIN  60 tablet  1  . warfarin (COUMADIN) 4 MG tablet Take 1 tablet on all days except 1 and 1/2 tablets on Friday or as directed by coumadin clinic.  35 tablet  3   No current facility-administered medications for this encounter.    Filed Vitals:   10/10/13 1034  BP: 110/52  Pulse: 74  Weight: 165 lb 2 oz (74.9 kg)  SpO2: 93%   PHYSICAL EXAM: General:  Elderly, well appearing. NAD.No resp difficulty; interpreter present HEENT: normal Neck: supple. JVD 8  Carotids 2+ bilaterally; no bruits. No lymphadenopathy or thryomegaly appreciated. Cor: PMI laterally displaced. RRR. No s3 or murmur.  Lungs: clear Abdomen: soft, nontender, + minimally distended. No hepatosplenomegaly. No bruits or masses. Good bowel sounds. Extremities: no cyanosis, clubbing, rash. No ankle edema.  RUE PICC  Neuro: alert & orientedx3, cranial nerves grossly intact. Moves all 4 extremities w/o difficulty. Affect pleasant.  EKG: Vpaced with occasional PVC 84 bpm  ASSESSMENT & PLAN:  1. Chronic systolic CHF: NICM, EF 70% (12/2012) s/p CRT-D.   - NYHA II symptoms and volume status looks mildly elevated. He carries all of his weight in his abdomen and is distended. HIs Cr has been slightly elevated and  has held diuretics yesterday and today. Will change from lasix 80 mg BID to torsemide 40 mg BID. Told the patient if he can't afford medication to call the clinic and we can provide through the HF fund. - Optivol interrogated no crossings over  threshold and thoracic impedence down, but is starting to trend up. No afib. Activity down to 1 hr a day. - Continue milrinone 0.25 mcg thru PICC. -  Continue coreg 6.25 mg BID, spiro 25 mg daily and losartan 25 mg daily. Will need to continue to watch Cr and if remains elevated cut spiro back or discontinue all together.   - Not on Ace-I due to rash.  - BMET weekely with [facility] - Reinforced the need and importance of daily weights, a low sodium diet, and fluid restriction (less than 2 L a day) through interpreter. Instructed to call the HF clinic if weight increases more than 3 lbs overnight or 5 lbs in a week.  2. Paroxysmal atrial fibrillation: Continue Coumadin and BB. Followed in Coumadin clinic. No afib on optivol today.  3. Pulmonary arterial hypertension: Noted on RHC 02/21/13.  Now on Adcirca 40 mg daily.  With no milrinone, PVR 9.6 WU and PA mean 44 mmHg. Milrinone was restarted since that time. 4. CKD: Follow closely with metolazone use and change to torsemide. Weekly BMET with [place].  Follow up 2 weeks Rande Brunt   10/10/2013

## 2013-10-10 NOTE — Patient Instructions (Signed)
Deje de tomar su lasix  Comience a tomar torsemide 40 mg ( 2 comprimidos ) por la maana y 40 mg ( 2 comprimidos ) por la noche .  Llame si usted no puede pagar su nuevo medicamento .  No ms de caldo de pollo .  Haga un seguimiento por 2 semanas.  Hacer las siguientes cosas todos los dias... 1) Mismo peso en la manana antes del desayuno, al [employer]. 2) Tomar sus medicamentos segun lo prescripto. 3) Comer alimentos bajos en sal.  Limitar la ingesta de sodio a 2.000mg  por dia. 4) Mantenerse tan activo como sea posible todos los [place]. 5) Limitar todos los fluidos para el dia a menos de 2 litros. 6)

## 2013-10-10 NOTE — Progress Notes (Signed)
Interpreter Lesle Chris for  [facility].

## 2013-10-11 ENCOUNTER — Encounter: Payer: Self-pay | Admitting: Internal Medicine

## 2013-10-11 NOTE — Addendum Note (Signed)
Encounter addended by: Evalee Mutton, CCT on: 10/11/2013  9:44 AM<BR>     Documentation filed: Charges VN

## 2013-10-15 ENCOUNTER — Encounter: Payer: Self-pay | Admitting: Adult Health

## 2013-10-15 ENCOUNTER — Telehealth (HOSPITAL_COMMUNITY): Payer: Self-pay | Admitting: Cardiology

## 2013-10-15 NOTE — Telephone Encounter (Signed)
[name], RN called with concerns during [facility] visit Pt C/O cough and increased SOB, pt also C/O and swelling Ab girth 103  Weight is up by pts scale 151 today (normally 143)  Recent med changes with most recent visit Torsemide 40 mg BID, Metolazone 2.5 twice weekly, Potassium 40 Meq daily Verified with interpreter and [name],Rn pt has adhered to medication adjustments  Per vo Tyler Christensen Clegg, NP 80 mg IV Lasix today with 40 Meq Potassium Labs as scheduled and HH to return for prn visit later in the week for further evaluation/ update

## 2013-10-16 ENCOUNTER — Other Ambulatory Visit (HOSPITAL_COMMUNITY): Payer: Self-pay | Admitting: Internal Medicine

## 2013-10-18 ENCOUNTER — Telehealth (HOSPITAL_COMMUNITY): Payer: Self-pay | Admitting: Cardiology

## 2013-10-18 NOTE — Telephone Encounter (Signed)
[name] called after [facility] visit today abdomnal girth is down Pt states he is breathing much better No edema Lungs clear B/P 118/67 HR 80 irregular O2 say 94-96% Weight today 146(4/29 147 4/27 153) Should pt have weight parameters for the weekend?

## 2013-10-23 ENCOUNTER — Ambulatory Visit (HOSPITAL_COMMUNITY)
Admission: RE | Admit: 2013-10-23 | Discharge: 2013-10-23 | Disposition: A | Discharge status: Home / Self Care | Payer: Medicare Other | Source: Ambulatory Visit | Attending: Internal Medicine | Admitting: Internal Medicine

## 2013-10-23 ENCOUNTER — Encounter (HOSPITAL_COMMUNITY): Payer: Self-pay

## 2013-10-23 VITALS — BP 126/55 | HR 91 | Resp 18 | Wt 167.5 lb

## 2013-10-23 DIAGNOSIS — I2789 Other specified pulmonary heart diseases: Secondary | ICD-10-CM | POA: Insufficient documentation

## 2013-10-23 DIAGNOSIS — I4891 Unspecified atrial fibrillation: Secondary | ICD-10-CM | POA: Insufficient documentation

## 2013-10-23 DIAGNOSIS — I2721 Secondary pulmonary arterial hypertension: Secondary | ICD-10-CM

## 2013-10-23 DIAGNOSIS — Z8673 Personal history of transient ischemic attack (TIA), and cerebral infarction without residual deficits: Secondary | ICD-10-CM | POA: Insufficient documentation

## 2013-10-23 DIAGNOSIS — I5022 Chronic systolic (congestive) heart failure: Secondary | ICD-10-CM | POA: Insufficient documentation

## 2013-10-23 DIAGNOSIS — I428 Other cardiomyopathies: Secondary | ICD-10-CM | POA: Insufficient documentation

## 2013-10-23 DIAGNOSIS — I252 Old myocardial infarction: Secondary | ICD-10-CM | POA: Insufficient documentation

## 2013-10-23 DIAGNOSIS — Z7901 Long term (current) use of anticoagulants: Secondary | ICD-10-CM

## 2013-10-23 DIAGNOSIS — N189 Chronic kidney disease, unspecified: Secondary | ICD-10-CM | POA: Insufficient documentation

## 2013-10-23 DIAGNOSIS — I129 Hypertensive chronic kidney disease with stage 1 through stage 4 chronic kidney disease, or unspecified chronic kidney disease: Secondary | ICD-10-CM | POA: Insufficient documentation

## 2013-10-23 NOTE — Patient Instructions (Signed)
  Continuar medicamentos prescritos .  Siga en 3 semanas, Si se siente peor llame a la clnica o venga antes de su cita .  Hacer las siguientes cosas todos los dias... 1) Pesarse en la manana antes del desayuno, a la misma hora 2) Tomar sus medicamentos segun lo prescripto. 3) Comer alimentos bajos en sal.  Limitar consumo de sodio a 2.000mg  por dia. 4) Mantenerse tan activo como sea posible todos los [place]. 5) Limitar todos los fluidos para el dia a menos de 2 litros.

## 2013-10-23 NOTE — Progress Notes (Signed)
Patient ID: Tyler Christensen, male   DOB: 1933/06/18, 80 y.o.   MRN: [id number]  HPI: Tyler Christensen is a 80 y.o. [name] (non-english speaking) w/ PMHx significant for chronic systolic CHF (EF 34%), NICM, LBBB, PAF (on coumadin), HTN, HLD, CVA, Pulmonary Hypertension and h/o seizure d/o (2/2 CVA 05/2012).   Admitted to [facility] 06/2012 with acute decompensated heart failure EF 20%. As noted below, also diagnosed with severe pulmonary hypertension (PAPs ~ 90) from initial RHC and placed on Milrinone and Revatio (eventually transitioned to tadalafil 40) with marked clinical response and decrease in PA pressures. VQ low probability for pulmonary embolus.  Discharged on Milrinone at 0.375 mcg via PICC. Discharge Weight 146 pounds. Attempted to wean milrinone in 9/14 but failed due to worsening HF and PAH. Milrinone restarted.   08/14/12 S/P successful DC-CV of AF 10/26/12 S/P BiV-ICD implant 09/28/12 ECHO EF 15% 01/07/13 ECHO EF 20%, restrictive diastolic function, mildly decreased RV size and systolic fxn, mild to moderate MR, PA systolic pressure 59 mmHg  02/21/13 ECHO EF 20% PA pressure mean 59   RHC 02/21/13 (off milrinone) RA = 9 with v-waves to 20  RV = 72/8/20  PA = 69/27 (44)  PCW = 18-20  Fick cardiac output/index = 2.7/1.5  PVR = 9.6 woods  FA sat = 96%  PA sat = 48%, 52%  Follow up: Since last visit called with increased weight gain and SOB and swelling. Gave 80 mg IV lasix with 40 meq of potassium and he had increased UOP and weight decreased. Weight at home 143-145 lbs. Denies SOB, orthopnea, CP or LE edema. + abdominal distention. Appetite good. No heart palpitations. Reports following low salt diet diet and drinking less than 2L a day.     multiple calls from [facility] RN about increased SOB, increased abdominal girth and increased weight. His diuretics were increased along with his milrinone to 0.375. With increase in milrinone his HR increased and was irregular per HHRN, milrinone decreased back to 0.25. Cr  is increasing and is up from 1.56 to 2.39. He held lasix for 2 days along with his potassium. Reports he is SOB with minimal activity and his weight is increasing. Denies CP, LE edema or orthopnea. Reports his SOB improved with his increased diuretics. +palpitations. Following a low salt diet and drinking less than 2L a day. Weight has increased 12 lbs in past week. With translators help found out patient has been eating chicken broth for past couple of days, he did not know it had a lot of salt in it.   NO ACE-I 2/2 rash  Labs (7/14): HCT 39.7, K 4.6, creatinine 1.2          (8/26) HCT 37.7 K 4.3 Creatinine 1.6          (02/26/13) HCT 38.2, K 4.4, Creatinine 1.43 Mag 2.2         (10/14) K 4.5, creatinine 1.55    (04/09/13) K+ 3.9, Cr 1.40    (04/30/13) K +4.0, Cr 1.64, Mag 2.1, pro-BNP 1057     (06/25/13) K 4.2 Creatinine 1.58 Magnesium 2.0     (07/16/13) K 4.2, Cr 1.67    (2/15) creatinine 1.97 => 1.94, K 4.6, HCT 39.4    (4/15) K 4.5, creatinine 1.56, pro-BNP 6912    (4/15) K 3.9, creatinine 2.39, mag 2.1     (09/2013) K 3.7, creatinine 2.03        SH: Lives with his wife here in [place].   ROS:  All systems negative except as listed in HPI, PMH and Problem List.  Past Medical History  Diagnosis Date  . HTN (hypertension)   . High cholesterol   . CHF (congestive heart failure)   . BBBB (bilateral bundle branch block)   . LBBB (left bundle branch block)   . Weakness   . Tired   . Seizures 06/11/2012    new onset/notes (06/11/2012)  . SOB (shortness of breath)     "sometimes when I lay down;; related to not taking my RX" (06/11/2012)  . Myocardial infarction     06/10/2012  . Atrial fibrillation   . Stroke   . Atrial thrombus     left  . Pulmonary HTN     Current Outpatient Prescriptions  Medication Sig Dispense Refill  . potassium chloride SA (K-DUR,KLOR-CON) 20 MEQ tablet Take 2 tablets (40 mEq total) by mouth daily. TAKE EXTRA TAB WHEN YOU TAKE METOLAZONE  75 tablet  3   . spironolactone (ALDACTONE) 25 MG tablet Take 1 tablet (25 mg total) by mouth daily.  30 tablet  6  . carvedilol (COREG) 6.25 MG tablet 6.25 mg 2 (two) times daily with a meal.       . Choline Fenofibrate (FENOFIBRIC ACID) 45 MG CPDR TAKE 1 CAPSULE BY MOUTH EVERY DAY  30 capsule  3  . loratadine (CLARITIN) 10 MG tablet TAKE 1 TABLET BY MOUTH DAILY TAKE 1 TABLET BY MOUTH DAILY  30 tablet  6  . losartan (COZAAR) 25 MG tablet Take 12.5 mg by mouth 2 (two) times daily.      . magnesium oxide (MAG-OX) 400 MG tablet Take 1 tablet (400 mg total) by mouth daily.  30 tablet  6  . metolazone (ZAROXOLYN) 2.5 MG tablet Take 1 tablet (2.5 mg total) by mouth 2 (two) times a week. Every Sat and Tue  10 tablet  3  . milrinone (PRIMACOR) 20 MG/100ML SOLN infusion Inject 16.95 mcg/min into the vein continuous.  100 mL    . simvastatin (ZOCOR) 40 MG tablet Take 40 mg by mouth every evening.      . Tadalafil, PAH, 20 MG TABS Take 2 tablets (40 mg total) by mouth daily.  60 tablet  6  . torsemide (DEMADEX) 20 MG tablet Take 2 tablets (40 mg total) by mouth 2 (two) times daily.  120 tablet  3  . traMADol (ULTRAM) 50 MG tablet TAKE 1 TABLET BY MOUTH TWICE DAILY AS NEEDED FOR PAIN  60 tablet  1  . warfarin (COUMADIN) 4 MG tablet Take 1 tablet on all days except 1 and 1/2 tablets on Friday or as directed by coumadin clinic.  35 tablet  3   No current facility-administered medications for this encounter.    Filed Vitals:   10/23/13 0953  BP: 126/55  Pulse: 91  Resp: 18  Weight: 167 lb 8 oz (75.978 kg)  SpO2: 97%   PHYSICAL EXAM: General:  Elderly, well appearing. NAD.No resp difficulty; interpreter present HEENT: normal Neck: supple. JVD 8  Carotids 2+ bilaterally; no bruits. No lymphadenopathy or thryomegaly appreciated. Cor: PMI laterally displaced. RRR. No s3 or murmur.  Lungs: clear Abdomen: soft, nontender, + minimally distended. No hepatosplenomegaly. No bruits or masses. Good bowel  sounds. Extremities: no cyanosis, clubbing, rash. No ankle edema.  RUE PICC  Neuro: alert & orientedx3, cranial nerves grossly intact. Moves all 4 extremities w/o difficulty. Affect pleasant.  EKG: Vpaced with occasional PVC 84 bpm  ASSESSMENT & PLAN:  1.  Chronic systolic CHF: NICM, EF 70% (12/2012) s/p CRT-D Optivol; Translator used - NYHA II symptoms and volume status looks mildly elevated. Very difficult to assess volume status since he carries all of his weight in his abdomen. Each time we increase diuretics his Cr gets worse, but he does report feeling better. Will continue torsemide 40 gm BID. He has weekly BMET with [place]. - Discussed with translator about repeating [place] to assess PVR and hemodynamics, does he really have volume on board. We also discussed placing cardiomems device if PVR low enough to help manage fluid. Patient is interested in proceeding and we will call when we get everything arranged. - Continue milrinone 0.25 mcg thru PICC, did not tolerate increase in the past reported he had heart palpitations.  -  Continue coreg 6.25 mg BID, spiro 25 mg daily and losartan 25 mg daily. Will need to continue to watch Cr and if remains elevated cut spiro back or discontinue all together.   - Not on Ace-I due to rash.  - Reinforced the need and importance of daily weights, a low sodium diet, and fluid restriction (less than 2 L a day) through interpreter. Instructed to call the HF clinic if weight increases more than 3 lbs overnight or 5 lbs in a week.  2. Paroxysmal atrial fibrillation: Continue Coumadin and BB. Followed in Coumadin clinic. Appears to be NSR today.  3. Pulmonary arterial hypertension: Noted on RHC 02/21/13.  Now on Adcirca 40 mg daily.  With no milrinone, PVR 9.6 WU and PA mean 44 mmHg. Milrinone was restarted since that time.  4. CKD: Awaiting weekly BMET and will schedule RHC to assess hemodynamics. If he does have fluid on board will need to probably run Cr higher in order  to manage fluid and symptoms. Consider referral to nephrologist.   Follow up 3 weeks Rande Brunt   10/23/2013

## 2013-11-04 ENCOUNTER — Ambulatory Visit (INDEPENDENT_AMBULATORY_CARE_PROVIDER_SITE_OTHER): Payer: Medicare Other | Admitting: Pharmacist

## 2013-11-04 ENCOUNTER — Encounter: Payer: Self-pay | Admitting: Anesthesiology

## 2013-11-04 ENCOUNTER — Encounter: Payer: Self-pay | Admitting: Internal Medicine

## 2013-11-04 DIAGNOSIS — I4891 Unspecified atrial fibrillation: Secondary | ICD-10-CM

## 2013-11-04 LAB — POCT INR: INR: 1.6

## 2013-11-05 ENCOUNTER — Telehealth (HOSPITAL_COMMUNITY): Payer: Self-pay

## 2013-11-05 DIAGNOSIS — I4891 Unspecified atrial fibrillation: Secondary | ICD-10-CM

## 2013-11-05 DIAGNOSIS — I5023 Acute on chronic systolic (congestive) heart failure: Secondary | ICD-10-CM

## 2013-11-05 DIAGNOSIS — I509 Heart failure, unspecified: Secondary | ICD-10-CM

## 2013-11-05 DIAGNOSIS — Z452 Encounter for adjustment and management of vascular access device: Secondary | ICD-10-CM

## 2013-11-05 NOTE — Telephone Encounter (Signed)
Called patient's RN to inform patient of lab results and to have hom hold torsemide for 2 days, then resume as previously prescribed.  Also would like to know if patient is in aggreement to have [place] with Dr. Haroldine Laws.  [name] assures she will instruct patient on med adjustment and also ask about going forth with procedure (scheduled 5/26 at 10:00 am).

## 2013-11-06 ENCOUNTER — Telehealth (HOSPITAL_COMMUNITY): Payer: Self-pay

## 2013-11-06 NOTE — Progress Notes (Signed)
Spanish interpreter requested on site at 0800 am for assistance in helping staff and patient communicate during registration, check-in, and procedural instructions/understanding.  Request sent to [email or web address] per protocol.

## 2013-11-06 NOTE — Telephone Encounter (Signed)
Patient called with [facility] RN [name] and spanish interpreter over the phone.  Patient understands to hold torsemide today and tomorrow, then resume as prescribed on Friday 11/08/13.  Patient also agrees to come to hospital next Tuesday 11/12/13 at 0800 am for a right heart catheterization with Dr. Haroldine Laws.  Also informed to have nothing to eat or drink after midnight that morning and to hold his coumadin and torsemide that morning.  Patient understands.  We agreed to meet at our heart failure clinic at 0800 with a spanish interpreter and then walk together to short stay for registration and check-in.  Instructed to call us or [name] [facility] RN with [facility] if he has any questions. Renee Pain

## 2013-11-07 ENCOUNTER — Inpatient Hospital Stay (HOSPITAL_COMMUNITY)
Admission: EM | Admit: 2013-11-07 | Discharge: 2013-11-22 | DRG: 843 | Disposition: A | Discharge status: Home / Self Care | Payer: Medicare Other | Attending: Internal Medicine | Admitting: Internal Medicine

## 2013-11-07 ENCOUNTER — Emergency Department (HOSPITAL_COMMUNITY): Payer: Medicare Other

## 2013-11-07 ENCOUNTER — Encounter (HOSPITAL_COMMUNITY): Payer: Self-pay | Admitting: Emergency Medicine

## 2013-11-07 DIAGNOSIS — N179 Acute kidney failure, unspecified: Secondary | ICD-10-CM | POA: Diagnosis present

## 2013-11-07 DIAGNOSIS — I5022 Chronic systolic (congestive) heart failure: Secondary | ICD-10-CM

## 2013-11-07 DIAGNOSIS — I252 Old myocardial infarction: Secondary | ICD-10-CM

## 2013-11-07 DIAGNOSIS — B9561 Methicillin susceptible Staphylococcus aureus infection as the cause of diseases classified elsewhere: Secondary | ICD-10-CM

## 2013-11-07 DIAGNOSIS — A419 Sepsis, unspecified organism: Secondary | ICD-10-CM | POA: Diagnosis present

## 2013-11-07 DIAGNOSIS — I5023 Acute on chronic systolic (congestive) heart failure: Secondary | ICD-10-CM | POA: Diagnosis present

## 2013-11-07 DIAGNOSIS — Z7901 Long term (current) use of anticoagulants: Secondary | ICD-10-CM

## 2013-11-07 DIAGNOSIS — G40909 Epilepsy, unspecified, not intractable, without status epilepticus: Secondary | ICD-10-CM | POA: Diagnosis present

## 2013-11-07 DIAGNOSIS — I059 Rheumatic mitral valve disease, unspecified: Secondary | ICD-10-CM

## 2013-11-07 DIAGNOSIS — R1900 Intra-abdominal and pelvic swelling, mass and lump, unspecified site: Secondary | ICD-10-CM

## 2013-11-07 DIAGNOSIS — T80218A Other infection due to central venous catheter, initial encounter: Secondary | ICD-10-CM | POA: Diagnosis present

## 2013-11-07 DIAGNOSIS — I452 Bifascicular block: Secondary | ICD-10-CM | POA: Diagnosis present

## 2013-11-07 DIAGNOSIS — R0601 Orthopnea: Secondary | ICD-10-CM | POA: Diagnosis not present

## 2013-11-07 DIAGNOSIS — Z79899 Other long term (current) drug therapy: Secondary | ICD-10-CM

## 2013-11-07 DIAGNOSIS — E7800 Pure hypercholesterolemia, unspecified: Secondary | ICD-10-CM | POA: Diagnosis present

## 2013-11-07 DIAGNOSIS — I509 Heart failure, unspecified: Secondary | ICD-10-CM | POA: Diagnosis present

## 2013-11-07 DIAGNOSIS — A411 Sepsis due to other specified staphylococcus: Secondary | ICD-10-CM | POA: Diagnosis present

## 2013-11-07 DIAGNOSIS — C480 Malignant neoplasm of retroperitoneum: Principal | ICD-10-CM | POA: Diagnosis present

## 2013-11-07 DIAGNOSIS — Y849 Medical procedure, unspecified as the cause of abnormal reaction of the patient, or of later complication, without mention of misadventure at the time of the procedure: Secondary | ICD-10-CM | POA: Diagnosis present

## 2013-11-07 DIAGNOSIS — R188 Other ascites: Secondary | ICD-10-CM | POA: Diagnosis present

## 2013-11-07 DIAGNOSIS — Z8673 Personal history of transient ischemic attack (TIA), and cerebral infarction without residual deficits: Secondary | ICD-10-CM

## 2013-11-07 DIAGNOSIS — B957 Other staphylococcus as the cause of diseases classified elsewhere: Secondary | ICD-10-CM

## 2013-11-07 DIAGNOSIS — R570 Cardiogenic shock: Secondary | ICD-10-CM

## 2013-11-07 DIAGNOSIS — N189 Chronic kidney disease, unspecified: Secondary | ICD-10-CM | POA: Diagnosis present

## 2013-11-07 DIAGNOSIS — K766 Portal hypertension: Secondary | ICD-10-CM | POA: Diagnosis present

## 2013-11-07 DIAGNOSIS — E785 Hyperlipidemia, unspecified: Secondary | ICD-10-CM | POA: Diagnosis present

## 2013-11-07 DIAGNOSIS — I4891 Unspecified atrial fibrillation: Secondary | ICD-10-CM

## 2013-11-07 DIAGNOSIS — Z95810 Presence of automatic (implantable) cardiac defibrillator: Secondary | ICD-10-CM

## 2013-11-07 DIAGNOSIS — B9689 Other specified bacterial agents as the cause of diseases classified elsewhere: Secondary | ICD-10-CM | POA: Diagnosis present

## 2013-11-07 DIAGNOSIS — I2789 Other specified pulmonary heart diseases: Secondary | ICD-10-CM | POA: Diagnosis present

## 2013-11-07 DIAGNOSIS — E876 Hypokalemia: Secondary | ICD-10-CM | POA: Diagnosis not present

## 2013-11-07 DIAGNOSIS — R7881 Bacteremia: Secondary | ICD-10-CM

## 2013-11-07 DIAGNOSIS — I129 Hypertensive chronic kidney disease with stage 1 through stage 4 chronic kidney disease, or unspecified chronic kidney disease: Secondary | ICD-10-CM | POA: Diagnosis present

## 2013-11-07 DIAGNOSIS — I959 Hypotension, unspecified: Secondary | ICD-10-CM | POA: Diagnosis present

## 2013-11-07 LAB — URINALYSIS, ROUTINE W REFLEX MICROSCOPIC
BILIRUBIN URINE: NEGATIVE
Glucose, UA: 100 mg/dL — AB
Ketones, ur: NEGATIVE mg/dL
Leukocytes, UA: NEGATIVE
NITRITE: NEGATIVE
PROTEIN: NEGATIVE mg/dL
SPECIFIC GRAVITY, URINE: 1.019 (ref 1.005–1.030)
UROBILINOGEN UA: 0.2 mg/dL (ref 0.0–1.0)
pH: 5.5 (ref 5.0–8.0)

## 2013-11-07 LAB — CARBOXYHEMOGLOBIN
CARBOXYHEMOGLOBIN: 1.4 % (ref 0.5–1.5)
Methemoglobin: 0.8 % (ref 0.0–1.5)
O2 Saturation: 47.1 %
Total hemoglobin: 13.8 g/dL (ref 13.5–18.0)

## 2013-11-07 LAB — PROTIME-INR
INR: 2 — AB (ref 0.00–1.49)
INR: 2.43 — AB (ref 0.00–1.49)
Prothrombin Time: 22.1 seconds — ABNORMAL HIGH (ref 11.6–15.2)
Prothrombin Time: 25.6 seconds — ABNORMAL HIGH (ref 11.6–15.2)

## 2013-11-07 LAB — CBC
HCT: 38.2 % — ABNORMAL LOW (ref 39.0–52.0)
Hemoglobin: 12.3 g/dL — ABNORMAL LOW (ref 13.0–17.0)
MCH: 30.1 pg (ref 26.0–34.0)
MCHC: 32.2 g/dL (ref 30.0–36.0)
MCV: 93.4 fL (ref 78.0–100.0)
PLATELETS: 157 10*3/uL (ref 150–400)
RBC: 4.09 MIL/uL — AB (ref 4.22–5.81)
RDW: 15.4 % (ref 11.5–15.5)
WBC: 9.6 10*3/uL (ref 4.0–10.5)

## 2013-11-07 LAB — I-STAT TROPONIN, ED: Troponin i, poc: 0.03 ng/mL (ref 0.00–0.08)

## 2013-11-07 LAB — COMPREHENSIVE METABOLIC PANEL
ALK PHOS: 53 U/L (ref 39–117)
ALT: 12 U/L (ref 0–53)
AST: 19 U/L (ref 0–37)
Albumin: 3.8 g/dL (ref 3.5–5.2)
BUN: 87 mg/dL — AB (ref 6–23)
CO2: 29 meq/L (ref 19–32)
Calcium: 9.3 mg/dL (ref 8.4–10.5)
Chloride: 98 mEq/L (ref 96–112)
Creatinine, Ser: 2.33 mg/dL — ABNORMAL HIGH (ref 0.50–1.35)
GFR calc non Af Amer: 25 mL/min — ABNORMAL LOW (ref >90)
GFR, EST AFRICAN AMERICAN: 29 mL/min — AB (ref >90)
GLUCOSE: 150 mg/dL — AB (ref 70–99)
POTASSIUM: 3.4 meq/L — AB (ref 3.7–5.3)
Sodium: 143 mEq/L (ref 137–147)
TOTAL PROTEIN: 7.3 g/dL (ref 6.0–8.3)
Total Bilirubin: 0.8 mg/dL (ref 0.3–1.2)

## 2013-11-07 LAB — CBC WITH DIFFERENTIAL/PLATELET
Basophils Absolute: 0 10*3/uL (ref 0.0–0.1)
Basophils Relative: 0 % (ref 0–1)
Eosinophils Absolute: 0 10*3/uL (ref 0.0–0.7)
Eosinophils Relative: 0 % (ref 0–5)
HCT: 40.9 % (ref 39.0–52.0)
HEMOGLOBIN: 13.6 g/dL (ref 13.0–17.0)
LYMPHS ABS: 0.5 10*3/uL — AB (ref 0.7–4.0)
LYMPHS PCT: 3 % — AB (ref 12–46)
MCH: 31 pg (ref 26.0–34.0)
MCHC: 33.3 g/dL (ref 30.0–36.0)
MCV: 93.2 fL (ref 78.0–100.0)
MONOS PCT: 2 % — AB (ref 3–12)
Monocytes Absolute: 0.2 10*3/uL (ref 0.1–1.0)
Neutro Abs: 13.8 10*3/uL — ABNORMAL HIGH (ref 1.7–7.7)
Neutrophils Relative %: 95 % — ABNORMAL HIGH (ref 43–77)
Platelets: 199 10*3/uL (ref 150–400)
RBC: 4.39 MIL/uL (ref 4.22–5.81)
RDW: 15.1 % (ref 11.5–15.5)
WBC: 14.6 10*3/uL — AB (ref 4.0–10.5)

## 2013-11-07 LAB — BASIC METABOLIC PANEL
BUN: 69 mg/dL — ABNORMAL HIGH (ref 6–23)
CALCIUM: 9.2 mg/dL (ref 8.4–10.5)
CO2: 27 mEq/L (ref 19–32)
Chloride: 95 mEq/L — ABNORMAL LOW (ref 96–112)
Creatinine, Ser: 1.95 mg/dL — ABNORMAL HIGH (ref 0.50–1.35)
GFR calc non Af Amer: 31 mL/min — ABNORMAL LOW (ref >90)
GFR, EST AFRICAN AMERICAN: 36 mL/min — AB (ref >90)
Glucose, Bld: 156 mg/dL — ABNORMAL HIGH (ref 70–99)
Potassium: 3.3 mEq/L — ABNORMAL LOW (ref 3.7–5.3)
Sodium: 140 mEq/L (ref 137–147)

## 2013-11-07 LAB — URINE MICROSCOPIC-ADD ON

## 2013-11-07 LAB — MRSA PCR SCREENING: MRSA by PCR: NEGATIVE

## 2013-11-07 LAB — LIPASE, BLOOD: LIPASE: 95 U/L — AB (ref 11–59)

## 2013-11-07 LAB — PRO B NATRIURETIC PEPTIDE: Pro B Natriuretic peptide (BNP): 9356 pg/mL — ABNORMAL HIGH (ref 0–450)

## 2013-11-07 MED ORDER — ACETAMINOPHEN 325 MG PO TABS
650.0000 mg | ORAL_TABLET | ORAL | Status: DC | PRN
Start: 2013-11-07 — End: 2013-11-22

## 2013-11-07 MED ORDER — IOHEXOL 300 MG/ML  SOLN
25.0000 mL | INTRAMUSCULAR | Status: AC
  Administered 2013-11-07 (×2): 25 mL via ORAL

## 2013-11-07 MED ORDER — FUROSEMIDE 80 MG PO TABS
80.0000 mg | ORAL_TABLET | Freq: Two times a day (BID) | ORAL | Status: DC
Start: 2013-11-07 — End: 2013-11-08
  Administered 2013-11-07 – 2013-11-08 (×2): 80 mg via ORAL
  Filled 2013-11-07 (×4): qty 1
  Filled 2013-11-07: qty 2

## 2013-11-07 MED ORDER — WARFARIN SODIUM 4 MG PO TABS
4.0000 mg | ORAL_TABLET | Freq: Once | ORAL | Status: AC
  Administered 2013-11-07: 4 mg via ORAL
  Filled 2013-11-07: qty 1

## 2013-11-07 MED ORDER — ENOXAPARIN SODIUM 30 MG/0.3ML ~~LOC~~ SOLN
30.0000 mg | SUBCUTANEOUS | Status: DC
  Administered 2013-11-07: 30 mg via SUBCUTANEOUS
  Filled 2013-11-07 (×2): qty 0.3

## 2013-11-07 MED ORDER — SODIUM CHLORIDE 0.9 % IJ SOLN: 3.0000 mL | INTRAMUSCULAR | Status: DC | PRN

## 2013-11-07 MED ORDER — CARVEDILOL 3.125 MG PO TABS
3.1250 mg | ORAL_TABLET | Freq: Two times a day (BID) | ORAL | Status: DC
  Administered 2013-11-07 – 2013-11-17 (×21): 3.125 mg via ORAL
  Filled 2013-11-07 (×24): qty 1

## 2013-11-07 MED ORDER — ONDANSETRON HCL 4 MG/2ML IJ SOLN: 4.0000 mg | Freq: Four times a day (QID) | INTRAMUSCULAR | Status: DC | PRN

## 2013-11-07 MED ORDER — MILRINONE IN DEXTROSE 20 MG/100ML IV SOLN
0.2500 ug/kg/min | INTRAVENOUS | Status: DC
  Administered 2013-11-07 – 2013-11-18 (×18): 0.25 ug/kg/min via INTRAVENOUS
  Filled 2013-11-07 (×17): qty 100

## 2013-11-07 MED ORDER — WARFARIN - PHARMACIST DOSING INPATIENT: Freq: Every day | Status: DC

## 2013-11-07 MED ORDER — TADALAFIL (PAH) 20 MG PO TABS
40.0000 mg | ORAL_TABLET | Freq: Every day | ORAL | Status: DC
  Administered 2013-11-08 – 2013-11-22 (×15): 40 mg via ORAL
  Filled 2013-11-07 (×17): qty 2

## 2013-11-07 MED ORDER — SODIUM CHLORIDE 0.9 % IJ SOLN: 3.0000 mL | Freq: Two times a day (BID) | INTRAMUSCULAR | Status: DC

## 2013-11-07 MED ORDER — SODIUM CHLORIDE 0.9 % IV SOLN
250.0000 mL | INTRAVENOUS | Status: DC | PRN
  Administered 2013-11-07: 250 mL via INTRAVENOUS

## 2013-11-07 MED ORDER — SODIUM CHLORIDE 0.9 % IV SOLN: INTRAVENOUS | Status: DC

## 2013-11-07 MED ORDER — ASPIRIN 81 MG PO CHEW: 81.0000 mg | CHEWABLE_TABLET | ORAL | Status: DC

## 2013-11-07 NOTE — Progress Notes (Signed)
[facility]  Patient Status:   Active pt with [facility] up to this readmission  [facility] is providing the following services: HHRN and [facility] for home inotropes.  Will follow pt while in the hospital to [place] home when deemed appropriate.   If patient discharges after hours, please call [phone].   Larry Sierras 11/07/2013, 6:33 PM

## 2013-11-07 NOTE — ED Provider Notes (Signed)
80 year old male comes in with five-day history of epigastric pain and abdominal distention. There is associated nausea and vomiting. He has had subjective fever and chills. On exam, abdomen is moderately distended with mild epigastric tenderness. CT scan is pending.  Medical screening examination/treatment/procedure(s) were conducted as a shared visit with non-physician practitioner(s) and myself.  I personally evaluated the patient during the encounter.   EKG Interpretation   Date/Time:  Thursday Nov 07 2013 06:30:46 EDT Ventricular Rate:  114 PR Interval:    QRS Duration: 166 QT Interval:  405 QTC Calculation: 558 R Axis:   -118 Text Interpretation:  Electronic ventricular pacemaker Premature  ventricular complexes When compared with ECG of 10/10/2013, No significant  change was found Confirmed by [facility]  MD, Townes Fuhs ([id number]) on 11/07/2013  6:39:55 AM        Delora Fuel, MD 85/63/14 [id number]

## 2013-11-07 NOTE — ED Provider Notes (Signed)
Medical screening examination/treatment/procedure(s) were performed by non-physician practitioner and as supervising physician I was immediately available for consultation/collaboration.   Delora Fuel, MD 71/16/57 [id number]

## 2013-11-07 NOTE — Progress Notes (Signed)
PA  Gave order to hold milrinone for 2 hours while ports are tpa by iv team.

## 2013-11-07 NOTE — ED Notes (Signed)
[name] Clegg at bedside

## 2013-11-07 NOTE — ED Notes (Signed)
Unable to collect blood cultures from PICC line.  Would not pull back.

## 2013-11-07 NOTE — Progress Notes (Signed)
Had 2 runs of 2 loose brown stool in mod amount , PA made aware, awaiting order.

## 2013-11-07 NOTE — Progress Notes (Signed)
Admission history taken from wife with hospital spanish interpreter.

## 2013-11-07 NOTE — ED Notes (Signed)
Patient transported to CT 

## 2013-11-07 NOTE — ED Notes (Signed)
Used interpreter phone to explain to patient to drink first cup now and then the second at 0830.

## 2013-11-07 NOTE — Progress Notes (Signed)
ANTICOAGULATION CONSULT NOTE - Initial Consult  Pharmacy Consult for warfarin Indication: atrial fibrillation  Allergies  Allergen Reactions  . Lisinopril Rash    Patient Measurements: Weight: 170 lb (77.111 kg)   Vital Signs: Temp: 98.4 F (36.9 C) (05/21 7035) Temp src: Oral (05/21 0093) BP: 107/49 mmHg (05/21 1215) Pulse Rate: 98 (05/21 1215)  Labs:  Recent Labs  11/07/13 0708  HGB 13.6  HCT 40.9  PLT 199  LABPROT 22.1*  INR 2.00*  CREATININE 2.33*    The CrCl is unknown because both a height and weight (above a minimum accepted value) are required for this calculation.   Medical History: Past Medical History  Diagnosis Date  . HTN (hypertension)   . High cholesterol   . CHF (congestive heart failure)   . BBBB (bilateral bundle branch block)   . LBBB (left bundle branch block)   . Weakness   . Tired   . Seizures 06/11/2012    new onset/notes (06/11/2012)  . SOB (shortness of breath)     "sometimes when I lay down;; related to not taking my RX" (06/11/2012)  . Myocardial infarction     06/10/2012  . Atrial fibrillation   . Stroke   . Atrial thrombus     left  . Pulmonary HTN     Assessment: 80 YO non-English speaking male followed by Heart Failure team seen in the ED today with abdominal bloating, nausea, weight gain and dyspnea. History of AFib for which he had successful DCCV earlier this year and is on chronic warfarin. Most recent Coumadin Clinic visit was on 11/04/13- he is currently to be taking 4mg  daily except 6mg  on Fridays. INR during that clinic visit was 1.6 (was to take an extra dose that evening).  Admission INR today is 2. Hemoglobin 13.6, platelets 199. No bleeding noted.  Goal of Therapy:  INR 2-3 Monitor platelets by anticoagulation protocol: Yes   Plan:  1. Will give warfarin 4mg  po x1 tonight according to home dose. Anticipate he will be able to continue home dose during hospitalization unless there is a drastic change in  medications or INR 2. Daily PT/INR 3. Follow for s/s bleeding  Nikola Marone D. Maryclare Nydam, PharmD, BCPS Clinical Pharmacist Pager: [phone] 11/07/2013 1:32 PM

## 2013-11-07 NOTE — ED Notes (Signed)
Ordered diet tray 

## 2013-11-07 NOTE — ED Notes (Addendum)
Error in charting.

## 2013-11-07 NOTE — Progress Notes (Signed)
Admitted from the ED with home milrinone in progress via the left  PICC line. To change to hospital milrinone once  The line is TPA by iv team.

## 2013-11-07 NOTE — ED Provider Notes (Signed)
CSN: [id number]     Arrival date & time 11/07/13  0617 History   First MD Initiated Contact with Patient 11/07/13 [phone]     No chief complaint on file.    (Consider location/radiation/quality/duration/timing/severity/associated sxs/prior Treatment) HPI Comments: Patient presents to the ED with a chief complaint of constant, gradually worsening, abdominal pain.  Patient states that he has been having abdominal discomfort for the past 5-7 days.  He states that his belly is larger than normal.  He reports associated fevers, chills, nausea, and vomiting.  He denies diarrhea or constipation.  Last BM was yesterday, and was normal.  He has not taken any medications for his symptoms.  Additionally, patient has extensive cardiac disease and heart failure.  He is currently on milrinone prescribed by Dr. Haroldine Laws.  The history is provided by the patient. No language interpreter was used.    Past Medical History  Diagnosis Date  . HTN (hypertension)   . High cholesterol   . CHF (congestive heart failure)   . BBBB (bilateral bundle branch block)   . LBBB (left bundle branch block)   . Weakness   . Tired   . Seizures 06/11/2012    new onset/notes (06/11/2012)  . SOB (shortness of breath)     "sometimes when I lay down;; related to not taking my RX" (06/11/2012)  . Myocardial infarction     06/10/2012  . Atrial fibrillation   . Stroke   . Atrial thrombus     left  . Pulmonary HTN    Past Surgical History  Procedure Laterality Date  . Throat surgery  1942    "and nose" (06/11/2012); ?T&A  . Inguinal hernia repair  ~ 2008    "both sides" (06/11/2012)  . Tee without cardioversion  06/29/2012    Procedure: TRANSESOPHAGEAL ECHOCARDIOGRAM (TEE);  Surgeon: Jolaine Artist, MD;  Location[facility] ENDOSCOPY;  Service: Cardiovascular;  Laterality: N/A;  will need a spanish interpreter Interpreter will be here at 1330-Hope spoke w/ [place]  . Cardioversion N/A 08/14/2012    Procedure: CARDIOVERSION;   Surgeon: Jolaine Artist, MD;  Location: [facility] ENDOSCOPY;  Service: Cardiovascular;  Laterality: N/A;   No family history on file. History  Substance Use Topics  . Smoking status: Never Smoker   . Smokeless tobacco: Never Used  . Alcohol Use: No    Review of Systems  Constitutional: Positive for appetite change. Negative for fever and chills.  Respiratory: Negative for shortness of breath.   Cardiovascular: Negative for chest pain.  Gastrointestinal: Positive for nausea, vomiting, abdominal pain and abdominal distention. Negative for diarrhea and constipation.  Genitourinary: Negative for dysuria.      Allergies  Lisinopril  Home Medications   Prior to Admission medications   Medication Sig Start Date End Date Taking? Authorizing Provider  carvedilol (COREG) 6.25 MG tablet 6.25 mg 2 (two) times daily with a meal.  06/26/13   [name] D Clegg, NP  Choline Fenofibrate (FENOFIBRIC ACID) 45 MG CPDR TAKE 1 CAPSULE BY MOUTH EVERY DAY 10/16/13   Jolaine Artist, MD  loratadine (CLARITIN) 10 MG tablet TAKE 1 TABLET BY MOUTH DAILY TAKE 1 TABLET BY MOUTH DAILY 08/12/13   Jolaine Artist, MD  losartan (COZAAR) 25 MG tablet Take 12.5 mg by mouth 2 (two) times daily. 05/06/13   Rande Brunt, NP  magnesium oxide (MAG-OX) 400 MG tablet Take 1 tablet (400 mg total) by mouth daily. 05/24/13   Jolaine Artist, MD  metolazone (ZAROXOLYN) 2.5 MG  tablet Take 1 tablet (2.5 mg total) by mouth 2 (two) times a week. Every Sat and Tue 08/02/13   [name] D Ninfa Meeker, NP  milrinone (PRIMACOR) 20 MG/100ML SOLN infusion Inject 16.95 mcg/min into the vein continuous. 10/04/13   Jolaine Artist, MD  potassium chloride SA (K-DUR,KLOR-CON) 20 MEQ tablet Take 2 tablets (40 mEq total) by mouth daily. TAKE EXTRA TAB WHEN YOU TAKE METOLAZONE 10/04/13   Jolaine Artist, MD  simvastatin (ZOCOR) 40 MG tablet Take 40 mg by mouth every evening.    Historical Provider, MD  spironolactone (ALDACTONE) 25 MG tablet Take 1 tablet  (25 mg total) by mouth daily. 02/14/13   [name] D Clegg, NP  Tadalafil, PAH, 20 MG TABS Take 2 tablets (40 mg total) by mouth daily. 05/06/13   Rande Brunt, NP  torsemide (DEMADEX) 20 MG tablet Take 2 tablets (40 mg total) by mouth 2 (two) times daily. 10/10/13   Rande Brunt, NP  traMADol (ULTRAM) 50 MG tablet TAKE 1 TABLET BY MOUTH TWICE DAILY AS NEEDED FOR PAIN 09/03/13   Jolaine Artist, MD  warfarin (COUMADIN) 4 MG tablet Take 1 tablet on all days except 1 and 1/2 tablets on Friday or as directed by coumadin clinic. 06/19/13   Jolaine Artist, MD   There were no vitals taken for this visit. Physical Exam  Nursing note and vitals reviewed. Constitutional: He is oriented to person, place, and time. He appears well-developed and well-nourished.  HENT:  Head: Normocephalic and atraumatic.  Eyes: Conjunctivae and EOM are normal. Pupils are equal, round, and reactive to light. Right eye exhibits no discharge. Left eye exhibits no discharge. No scleral icterus.  Neck: Normal range of motion. Neck supple. No JVD present.  Cardiovascular: Regular rhythm and normal heart sounds.  Exam reveals no gallop and no friction rub.   No murmur heard. tachycardic  Pulmonary/Chest: Effort normal and breath sounds normal. No respiratory distress. He has no wheezes. He has no rales. He exhibits no tenderness.  Abdominal: Soft. Bowel sounds are normal. He exhibits no distension and no mass. There is no tenderness. There is no rebound and no guarding.  No focal abdominal tenderness, no RLQ tenderness or pain at McBurney's point, no RUQ tenderness or Murphy's sign, no left-sided abdominal tenderness, no fluid wave, or signs of peritonitis   Musculoskeletal: Normal range of motion. He exhibits no edema and no tenderness.  Neurological: He is alert and oriented to person, place, and time.  Skin: Skin is warm and dry.  Psychiatric: He has a normal mood and affect. His behavior is normal. Judgment and thought  content normal.    ED Course  Procedures (including critical care time) Results for orders placed during the hospital encounter of 11/07/13  CBC WITH DIFFERENTIAL      Result Value Ref Range   WBC 14.6 (*) 4.0 - 10.5 K/uL   RBC 4.39  4.22 - 5.81 MIL/uL   Hemoglobin 13.6  13.0 - 17.0 g/dL   HCT 40.9  39.0 - 52.0 %   MCV 93.2  78.0 - 100.0 fL   MCH 31.0  26.0 - 34.0 pg   MCHC 33.3  30.0 - 36.0 g/dL   RDW 15.1  11.5 - 15.5 %   Platelets 199  150 - 400 K/uL   Neutrophils Relative % 95 (*) 43 - 77 %   Neutro Abs 13.8 (*) 1.7 - 7.7 K/uL   Lymphocytes Relative 3 (*) 12 - 46 %  Lymphs Abs 0.5 (*) 0.7 - 4.0 K/uL   Monocytes Relative 2 (*) 3 - 12 %   Monocytes Absolute 0.2  0.1 - 1.0 K/uL   Eosinophils Relative 0  0 - 5 %   Eosinophils Absolute 0.0  0.0 - 0.7 K/uL   Basophils Relative 0  0 - 1 %   Basophils Absolute 0.0  0.0 - 0.1 K/uL  COMPREHENSIVE METABOLIC PANEL      Result Value Ref Range   Sodium 143  137 - 147 mEq/L   Potassium 3.4 (*) 3.7 - 5.3 mEq/L   Chloride 98  96 - 112 mEq/L   CO2 29  19 - 32 mEq/L   Glucose, Bld 150 (*) 70 - 99 mg/dL   BUN 87 (*) 6 - 23 mg/dL   Creatinine, Ser 2.33 (*) 0.50 - 1.35 mg/dL   Calcium 9.3  8.4 - 10.5 mg/dL   Total Protein 7.3  6.0 - 8.3 g/dL   Albumin 3.8  3.5 - 5.2 g/dL   AST 19  0 - 37 U/L   ALT 12  0 - 53 U/L   Alkaline Phosphatase 53  39 - 117 U/L   Total Bilirubin 0.8  0.3 - 1.2 mg/dL   GFR calc non Af Amer 25 (*) >90 mL/min   GFR calc Af Amer 29 (*) >90 mL/min  LIPASE, BLOOD      Result Value Ref Range   Lipase 95 (*) 11 - 59 U/L  PROTIME-INR      Result Value Ref Range   Prothrombin Time 22.1 (*) 11.6 - 15.2 seconds   INR 2.00 (*) 0.00 - 1.49  PRO B NATRIURETIC PEPTIDE      Result Value Ref Range   Pro B Natriuretic peptide (BNP) 9356.0 (*) 0 - 450 pg/mL  I-STAT TROPOININ, ED      Result Value Ref Range   Troponin i, poc 0.03  0.00 - 0.08 ng/mL   Comment 3            Ct Abdomen Pelvis Wo Contrast  11/07/2013    CLINICAL DATA:  Abdominal pain with nausea.  EXAM: CT ABDOMEN AND PELVIS WITHOUT CONTRAST  TECHNIQUE: Multidetector CT imaging of the abdomen and pelvis was performed following the standard protocol without IV contrast.  COMPARISON:  None.  FINDINGS: There is cardiomegaly.  Is a tiny hiatal hernia.  There is suggestion of very tiny subtle stones in the gallbladder. There is no dilatation of the biliary tree in the gallbladder wall is not thickened in the gallbladder is not distended. Liver is normal. There are calcified granulomas in the otherwise normal spleen. Pancreas and adrenal glands are normal. There are multiple diverticula scattered throughout the colon. The bowel is otherwise normal.  There are multiple low-density lesions in the right kidney, probably cysts, including an 11 mm lesion in the mid right kidney, a 3.8 mm lesion in the lower pole medially and 15 mm lesion laterally in the lower pole. There is a 5 cm cyst in the anterior aspect of the mid left kidney and exophytic 10 mm lesion on the left lower pole.  There is extensive calcification in the abdominal aorta and common iliac arteries. No acute osseous abnormality.  There is a 6.0 x 4.5 x 2.7 cm primarily fatty mass in the retroperitoneum between the upper pole of the right kidney and the upper lumbar spine and proximal soleus muscle. I suspect this represents a liposarcoma. This was not present on the prior  CT scan of 03/28/2004.  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  Possible tiny gallstones.  Probable bilateral renal cysts. This could be confirmed with ultrasound.  Diverticulosis.  No acute diverticulitis.   Electronically Signed   By: Rozetta Nunnery M.D.   On: 11/07/2013 11:15   Dg Chest 2 View  11/07/2013   CLINICAL DATA:  Chest pain, abdominal pain  EXAM: CHEST  2 VIEW  COMPARISON:  10/27/2012  FINDINGS: Cardiomegaly again noted. Three leads cardiac pacemaker is unchanged in position. Left arm PICC line stable  in position. No acute infiltrate or pulmonary edema. Mild degenerative changes thoracic spine again noted.  IMPRESSION: Cardiomegaly again noted. No acute infiltrate or pulmonary edema. Stable left arm PICC line position.   Electronically Signed   By: Lahoma Crocker M.D.   On: 11/07/2013 07:00      EKG Interpretation   Date/Time:  Thursday Nov 07 2013 06:30:46 EDT Ventricular Rate:  114 PR Interval:    QRS Duration: 166 QT Interval:  405 QTC Calculation: 558 R Axis:   -118 Text Interpretation:  Electronic ventricular pacemaker Premature  ventricular complexes When compared with ECG of 10/10/2013, No significant  change was found Confirmed by [facility]  MD, DAVID ([id number]) on 11/07/2013  6:39:55 AM      MDM   Final diagnoses:  Acute on chronic systolic heart failure  Atrial fibrillation    Patient with abdominal pain.  Patient is followed by Dr. Haroldine Laws, who will admit the patient.  Patient seen by and discussed with Drs. Roxanne Mins and [employer].  Labs findings as above.  Probable new liposarcoma.     Patient to be admitted by Dr. Haroldine Laws.  Montine Circle, PA-C 11/07/13 1454

## 2013-11-07 NOTE — ED Notes (Signed)
Pt spilled urinal on the floor. Cleaning done, instructed to give urine sample when able too.

## 2013-11-07 NOTE — ED Notes (Signed)
Patient transported to X-ray 

## 2013-11-07 NOTE — H&P (Signed)
Primary Cardiologist: Dr Haroldine Laws  HPI: Tyler Christensen is a 80 y.o. [name] (non-english speaking)well known to the HF team with a history of  chronic systolic CHF (ECHO 12/3530 EF 20%) on chronic home Milrinone 0.25 mcg, NICM, LBBB, PAF (on coumadin), HTN, HLD, CVA, Pulmonary Hypertension on tadalafil, PAF last DC-CV 08/14/2013 on chronic coumadin, and h/o seizure d/o (2/2 CVA 05/2012).   He had been followed closely in the HF clinic and was last seen 10/23/13. At that time he had increased weight and dyspnea on exertion. His diuretic regimen has been complicated by worsening renal function. He has been on home Milrinone continued of mirlrinone 0.25 mcg via PICC. Weight in the office was 167 pounds.  He was also set up for [place]. He is followed by [facility] for home inotropes since January 2014.   Today he presented to [facility] ED with increased abdominal bloating and dsypnea. He reports increased dyspnea with exertion and weight gain. Complains of abdominal tenderness. Taking all home medications. Complaining of nausea and abdominal discomfort.  Pertinent admission labs include: K 3.4, Creatinine 2.33, Pro BNP 9356 , CEs negative, WBC 14. 6, UA negative.  CXR- no edema noted.  CT abdomen -retroperitoneal mass which is a new finding.   FH: No family history of  Coronary disease SH:  Lives with his wife here in [place]. Does not drink alcohol or smoke.    Review of Systems:     Cardiac Review of Systems: {Y] = yes [ ]  = no  Chest Pain [    ]  Resting SOB [   ] Exertional SOB  [  Y]  Orthopnea [ Y ]   Pedal Edema [   ]    Palpitations [  ] Syncope  [  ]   Presyncope [   ]  General Review of Systems: [Y] = yes [  ]=no Constitional: recent weight change [ Y ]; anorexia [  ]; fatigue [ Y ]; nausea [  ]; night sweats [  ]; fever [  ]; or chills [  ];                                                                                                                                          Dental: poor dentition[   ]; Last Dentist visit:   Eye : blurred vision [  ]; diplopia [   ]; vision changes [  ];  Amaurosis fugax[  ]; Resp: cough [  ];  wheezing[  ];  hemoptysis[  ]; shortness of breath[Y  ]; paroxysmal nocturnal dyspnea[  ]; dyspnea on exertion[Y  ]; or orthopnea[Y ];  GI:  gallstones[  ], vomiting[  ];  dysphagia[  ]; melena[  ];  hematochezia [  ]; heartburn[  ];   Hx of  Colonoscopy[  ]; GU: kidney stones [  ]; hematuria[  ];  dysuria [  ];  nocturia[  ];  history of     obstruction [  ];                 Skin: rash, swelling[  ];, hair loss[  ];  peripheral edema[  ];  or itching[  ]; Musculosketetal: myalgias[  ];  joint swelling[  ];  joint erythema[  ];  joint pain[  ];  back pain[  ];  Heme/Lymph: bruising[  ];  bleeding[  ];  anemia[  ];  Neuro: TIA[  ];  headaches[  ];  stroke[ Y ];  vertigo[  ];  seizures[  ];   paresthesias[  ];  difficulty walking[  ];  Psych:depression[  ]; anxiety[  ];  Endocrine: diabetes[  ];  thyroid dysfunction[  ];  Immunizations: Flu [  ]; Pneumococcal[  ];  Other: Abdominal pain  Past Medical History  Diagnosis Date  . HTN (hypertension)   . High cholesterol   . CHF (congestive heart failure)   . BBBB (bilateral bundle branch block)   . LBBB (left bundle branch block)   . Weakness   . Tired   . Seizures 06/11/2012    new onset/notes (06/11/2012)  . SOB (shortness of breath)     "sometimes when I lay down;; related to not taking my RX" (06/11/2012)  . Myocardial infarction     06/10/2012  . Atrial fibrillation   . Stroke   . Atrial thrombus     left  . Pulmonary HTN      (Not in a hospital admission)   Allergies  Allergen Reactions  . Lisinopril Rash    History   Social History  . Marital Status: Divorced    Spouse Name: N/A    Number of Children: N/A  . Years of Education: N/A   Occupational History  . Not on file.   Social History Main Topics  . Smoking status: Never Smoker   . Smokeless tobacco: Never Used  . Alcohol  Use: No  . Drug Use: No  . Sexual Activity: No   Other Topics Concern  . Not on file   Social History Narrative  . No narrative on file    History reviewed. No pertinent family history.  PHYSICAL EXAM: Filed Vitals:   11/07/13 1123  BP: 97/81  Pulse: 104  Temp:   Resp: 17   General:  Chronically ill  appearing. SOB with exertion to side of bed. O2 sats dropped to 82% with exertion.  HEENT: normal Neck: supple. JVD ~10 . Carotids 2+ bilat; no bruits. No lymphadenopathy or thryomegaly appreciated. Cor: PMI nondisplaced. Irregular rate & rhythm. No rubs, or murmurs.+ S3  Lungs: Clear. 2 liters [place] Abdomen: soft, nontender, +++ distended.  Abdominal tenderness. No hepatosplenomegaly. No bruits or masses. Good bowel sounds. Extremities: no cyanosis, clubbing, rash, edema RUE PICC Neuro: alert & oriented x 3, cranial nerves grossly intact. moves all 4 extremities w/o difficulty. Affect pleasant.  ECG: A fib 100 AV paced   Results for orders placed during the hospital encounter of 11/07/13 (from the past 24 hour(s))  CBC WITH DIFFERENTIAL     Status: Abnormal   Collection Time    11/07/13  7:08 AM      Result Value Ref Range   WBC 14.6 (*) 4.0 - 10.5 K/uL   RBC 4.39  4.22 - 5.81 MIL/uL   Hemoglobin 13.6  13.0 - 17.0 g/dL   HCT 40.9  39.0 - 52.0 %  MCV 93.2  78.0 - 100.0 fL   MCH 31.0  26.0 - 34.0 pg   MCHC 33.3  30.0 - 36.0 g/dL   RDW 15.1  11.5 - 15.5 %   Platelets 199  150 - 400 K/uL   Neutrophils Relative % 95 (*) 43 - 77 %   Neutro Abs 13.8 (*) 1.7 - 7.7 K/uL   Lymphocytes Relative 3 (*) 12 - 46 %   Lymphs Abs 0.5 (*) 0.7 - 4.0 K/uL   Monocytes Relative 2 (*) 3 - 12 %   Monocytes Absolute 0.2  0.1 - 1.0 K/uL   Eosinophils Relative 0  0 - 5 %   Eosinophils Absolute 0.0  0.0 - 0.7 K/uL   Basophils Relative 0  0 - 1 %   Basophils Absolute 0.0  0.0 - 0.1 K/uL  COMPREHENSIVE METABOLIC PANEL     Status: Abnormal   Collection Time    11/07/13  7:08 AM      Result  Value Ref Range   Sodium 143  137 - 147 mEq/L   Potassium 3.4 (*) 3.7 - 5.3 mEq/L   Chloride 98  96 - 112 mEq/L   CO2 29  19 - 32 mEq/L   Glucose, Bld 150 (*) 70 - 99 mg/dL   BUN 87 (*) 6 - 23 mg/dL   Creatinine, Ser 2.33 (*) 0.50 - 1.35 mg/dL   Calcium 9.3  8.4 - 10.5 mg/dL   Total Protein 7.3  6.0 - 8.3 g/dL   Albumin 3.8  3.5 - 5.2 g/dL   AST 19  0 - 37 U/L   ALT 12  0 - 53 U/L   Alkaline Phosphatase 53  39 - 117 U/L   Total Bilirubin 0.8  0.3 - 1.2 mg/dL   GFR calc non Af Amer 25 (*) >90 mL/min   GFR calc Af Amer 29 (*) >90 mL/min  LIPASE, BLOOD     Status: Abnormal   Collection Time    11/07/13  7:08 AM      Result Value Ref Range   Lipase 95 (*) 11 - 59 U/L  PROTIME-INR     Status: Abnormal   Collection Time    11/07/13  7:08 AM      Result Value Ref Range   Prothrombin Time 22.1 (*) 11.6 - 15.2 seconds   INR 2.00 (*) 0.00 - 1.49  PRO B NATRIURETIC PEPTIDE     Status: Abnormal   Collection Time    11/07/13  7:08 AM      Result Value Ref Range   Pro B Natriuretic peptide (BNP) 9356.0 (*) 0 - 450 pg/mL  I-STAT TROPOININ, ED     Status: None   Collection Time    11/07/13  7:12 AM      Result Value Ref Range   Troponin i, poc 0.03  0.00 - 0.08 ng/mL   Comment 3           URINALYSIS, ROUTINE W REFLEX MICROSCOPIC     Status: Abnormal   Collection Time    11/07/13 10:50 AM      Result Value Ref Range   Color, Urine YELLOW  YELLOW   APPearance CLEAR  CLEAR   Specific Gravity, Urine 1.019  1.005 - 1.030   pH 5.5  5.0 - 8.0   Glucose, UA 100 (*) NEGATIVE mg/dL   Hgb urine dipstick SMALL (*) NEGATIVE   Bilirubin Urine NEGATIVE  NEGATIVE   Ketones, ur NEGATIVE  NEGATIVE mg/dL   Protein, ur NEGATIVE  NEGATIVE mg/dL   Urobilinogen, UA 0.2  0.0 - 1.0 mg/dL   Nitrite NEGATIVE  NEGATIVE   Leukocytes, UA NEGATIVE  NEGATIVE  URINE MICROSCOPIC-ADD ON     Status: None   Collection Time    11/07/13 10:50 AM      Result Value Ref Range   Squamous Epithelial / LPF RARE  RARE     WBC, UA 0-2  <3 WBC/hpf   RBC / HPF 0-2  <3 RBC/hpf   Bacteria, UA RARE  RARE   Ct Abdomen Pelvis Wo Contrast  11/07/2013   CLINICAL DATA:  Abdominal pain with nausea.  EXAM: CT ABDOMEN AND PELVIS WITHOUT CONTRAST  TECHNIQUE: Multidetector CT imaging of the abdomen and pelvis was performed following the standard protocol without IV contrast.  COMPARISON:  None.  FINDINGS: There is cardiomegaly.  Is a tiny hiatal hernia.  There is suggestion of very tiny subtle stones in the gallbladder. There is no dilatation of the biliary tree in the gallbladder wall is not thickened in the gallbladder is not distended. Liver is normal. There are calcified granulomas in the otherwise normal spleen. Pancreas and adrenal glands are normal. There are multiple diverticula scattered throughout the colon. The bowel is otherwise normal.  There are multiple low-density lesions in the right kidney, probably cysts, including an 11 mm lesion in the mid right kidney, a 3.8 mm lesion in the lower pole medially and 15 mm lesion laterally in the lower pole. There is a 5 cm cyst in the anterior aspect of the mid left kidney and exophytic 10 mm lesion on the left lower pole.  There is extensive calcification in the abdominal aorta and common iliac arteries. No acute osseous abnormality.  There is a 6.0 x 4.5 x 2.7 cm primarily fatty mass in the retroperitoneum between the upper pole of the right kidney and the upper lumbar spine and proximal soleus muscle. I suspect this represents a liposarcoma. This was not present on the prior CT scan of 03/28/2004.  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  Possible tiny gallstones.  Probable bilateral renal cysts. This could be confirmed with ultrasound.  Diverticulosis.  No acute diverticulitis.   Electronically Signed   By: Rozetta Nunnery M.D.   On: 11/07/2013 11:15   Dg Chest 2 View  11/07/2013   CLINICAL DATA:  Chest pain, abdominal pain  EXAM: CHEST  2 VIEW   COMPARISON:  10/27/2012  FINDINGS: Cardiomegaly again noted. Three leads cardiac pacemaker is unchanged in position. Left arm PICC line stable in position. No acute infiltrate or pulmonary edema. Mild degenerative changes thoracic spine again noted.  IMPRESSION: Cardiomegaly again noted. No acute infiltrate or pulmonary edema. Stable left arm PICC line position.   Electronically Signed   By: Lahoma Crocker M.D.   On: 11/07/2013 07:00     ASSESSMENT: 1. A/C Systolic Heart Failure - ECHO 2014 EF 20% 2. A/C respirartory failure - Desaturations noted with exertion to 82%  3. Pulmonary Arterial Hypertension- on chronic tadalafil 20 mg daily 4. Back in A fib  Had succesful DC-CV 08/14/13. On chronic coumadin 5. CKD --creatinine baseline 1.9-2/0  6.  S/P [employer]  CRT-D 7. Hypotension 8. Ascites  9. Hypokalemia 10. Elevated WBCs 11. Retroperitoneal Mass per CT . ? Liposarcoma   PLAN/DISCUSSION:  Tyler Christensen is a 81 year with acute/chronic systolic heart faliure and pulmonary hypertensionon chronic Milrinone at 1.47 mcg complicated by worsening  renal function.  Today he presented to with increase dyspnea on exertion and abdominal distention. CT of abdomen with retroperitoneal mass noted.   On exam volume status he does not appear volume overloaded. He has received 80 mg IV lasix and remains dyspneic with exertion moving  to the side of the bed with oxygen saturations dropping to 82%.  Will need to continue Milrinone at 0.25 mcg and check CO-OX. If low will increase to 0.375 mcg. Continue adcirca 40 mg daily. Check CVP to assist with volume status. Will need RHC at some point to further evaluate pulmonary pressure.. Check ECHO. Will get ICD interrogated. After interrogation he has been in Afib for the last 25 days. INR has not been therapeutic for the last 30 days. Will need tee-dc-cv after he is stablilized.   WBC elevated. Afebrile PICC site ok. Check blood cultures x 2.   UA was ok. CXR ok. CT of abdomen  with retropertioneal mass noted ?Lipoliposarcoma.   Admit to SDU today and follow closely . Will need goals of care meeting.   [name] D Clegg NP-C  11:55 AM  Patient seen and examined with Darrick Grinder, NP. We discussed all aspects of the encounter. I agree with the assessment and plan as stated above.   Tyler Christensen appears ill. Neck veins are modestly elevated but ab distension appears out of proportion to his volume status. ICD interrogated and Optivol level looks OK. He has recurrent AF for past 25 days with retention of BIV pacing. 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 Will need admit to ICU/Stepdown. Give one dose of IV lasix and will need to monitor with CVP and co-ox to help further decision making. Recent INR 1.6. Will likely need TEE and DC-CV when more stable. Will likely need biopsy of mass prior to DC-CV so we don't have to interrupt anti-coag.   Shaune Pascal Liya Strollo,MD 1:54 PM

## 2013-11-07 NOTE — Progress Notes (Signed)
PIcc line has no blood return on both ports. Md aware who in turn tried to draw blood but to no avail. Claimed to call IVT to TPA the ports.

## 2013-11-07 NOTE — ED Notes (Signed)
Pt reports abdominal pain, nausea, and loss of appetite for 5 days. Pt's last BM was yesterday and normal. Pt denies diarrhea, dysuria.

## 2013-11-07 NOTE — Care Management Note (Addendum)
    Page 1 of 2   11/22/2013     9:17:44 AM CARE MANAGEMENT NOTE 11/22/2013  Patient:  Tyler Christensen, Tyler Christensen   Account Number:  [id number]  Date Initiated:  11/07/2013  Documentation initiated by:  Elissa Hefty  Subjective/Objective Assessment:   adm w heart failure     Action/Plan:   lives w fam, hx of adv homecare   Anticipated DC Date:  11/22/2013   Anticipated DC Plan:  [place]  CM consult      [facility] Choice  Resumption Of Svcs/PTA Provider   Choice offered to / List presented to:  NA        HH arranged  HH-1 RN      [place].   Status of service:  Completed, signed off Medicare Important Message given?  YES (If response is "NO", the following Medicare IM given date fields will be blank) Date Medicare IM given:  11/19/2013 Date Additional Medicare IM given:  11/22/2013  Discharge Disposition:  [place]  Per UR Regulation:  Reviewed for med. necessity/level of care/duration of stay  If discussed at [place] of Stay Meetings, dates discussed:    Comments:  6/5 0916 debbie Nikholas Geffre rn,bsn pt for disch. alerted [facility] of disch. pt to get milrinone hooked up around noon today.  6/4 1513 debbie Samit Sylve  rn,bsn have alerted [facility] of poss dc in am w home milrinone resumed  [place], BSN, MSHL, CCM  Nurse - Case Manager, (Unit [facility])  [phone]  11/18/2013 Intensity of service:  amiodarone 360 MG/200ML (1.8 mg/mL) IV infusion  :  Dose 30 mg/hr  :  Admin Dose 16.67 mL/hr  : 16.7 mL/hr  :  Intravenous  :  Continuous   Crystal Hutchinson RN, BSN, MSHL, CCM  Nurse - Case Manager, (Unit [place])  [phone]  11/15/2013 Social:  Home - Cuban/Spanish speaking HHS:  Active with [facility]  for home infusion services, Milrinone [facility]:  Active Dispostion Plan:  Pending

## 2013-11-08 ENCOUNTER — Encounter (HOSPITAL_COMMUNITY): Payer: Self-pay | Admitting: Pharmacy Technician

## 2013-11-08 DIAGNOSIS — Z95810 Presence of automatic (implantable) cardiac defibrillator: Secondary | ICD-10-CM

## 2013-11-08 LAB — MAGNESIUM: Magnesium: 1.8 mg/dL (ref 1.5–2.5)

## 2013-11-08 LAB — BASIC METABOLIC PANEL
BUN: 67 mg/dL — AB (ref 6–23)
BUN: 69 mg/dL — ABNORMAL HIGH (ref 6–23)
CO2: 29 meq/L (ref 19–32)
CO2: 30 mEq/L (ref 19–32)
CREATININE: 1.97 mg/dL — AB (ref 0.50–1.35)
Calcium: 8.4 mg/dL (ref 8.4–10.5)
Calcium: 9.1 mg/dL (ref 8.4–10.5)
Chloride: 97 mEq/L (ref 96–112)
Chloride: 93 mEq/L — ABNORMAL LOW (ref 96–112)
Creatinine, Ser: 1.91 mg/dL — ABNORMAL HIGH (ref 0.50–1.35)
GFR calc Af Amer: 37 mL/min — ABNORMAL LOW (ref >90)
GFR, EST AFRICAN AMERICAN: 35 mL/min — AB (ref >90)
GFR, EST NON AFRICAN AMERICAN: 32 mL/min — AB (ref >90)
GFR, EST NON AFRICAN AMERICAN: 31 mL/min — AB (ref >90)
GLUCOSE: 151 mg/dL — AB (ref 70–99)
GLUCOSE: 119 mg/dL — AB (ref 70–99)
Potassium: 2.7 mEq/L — CL (ref 3.7–5.3)
Potassium: 3.6 mEq/L — ABNORMAL LOW (ref 3.7–5.3)
Sodium: 140 mEq/L (ref 137–147)
Sodium: 136 mEq/L — ABNORMAL LOW (ref 137–147)

## 2013-11-08 LAB — CARBOXYHEMOGLOBIN
CARBOXYHEMOGLOBIN: 1.6 % — AB (ref 0.5–1.5)
METHEMOGLOBIN: 0.7 % (ref 0.0–1.5)
O2 SAT: 59.2 %
Total hemoglobin: 12.4 g/dL — ABNORMAL LOW (ref 13.5–18.0)

## 2013-11-08 LAB — PROTIME-INR
INR: 2.86 — AB (ref 0.00–1.49)
PROTHROMBIN TIME: 29 s — AB (ref 11.6–15.2)

## 2013-11-08 LAB — CLOSTRIDIUM DIFFICILE BY PCR: Toxigenic C. Difficile by PCR: NEGATIVE

## 2013-11-08 MED ORDER — MAGNESIUM SULFATE 40 MG/ML IJ SOLN
2.0000 g | Freq: Once | INTRAMUSCULAR | Status: AC
  Administered 2013-11-08: 2 g via INTRAVENOUS
  Filled 2013-11-08 (×2): qty 50

## 2013-11-08 MED ORDER — WARFARIN - PHARMACIST DOSING INPATIENT: Freq: Every day | Status: DC

## 2013-11-08 MED ORDER — WARFARIN SODIUM 2 MG PO TABS
2.0000 mg | ORAL_TABLET | Freq: Once | ORAL | Status: DC
  Filled 2013-11-08: qty 1

## 2013-11-08 MED ORDER — POTASSIUM CHLORIDE CRYS ER 20 MEQ PO TBCR
40.0000 meq | EXTENDED_RELEASE_TABLET | ORAL | Status: AC
  Administered 2013-11-08 (×2): 40 meq via ORAL
  Filled 2013-11-08 (×2): qty 2

## 2013-11-08 MED ORDER — VANCOMYCIN HCL IN DEXTROSE 750-5 MG/150ML-% IV SOLN
750.0000 mg | INTRAVENOUS | Status: DC
  Administered 2013-11-09 – 2013-11-10 (×2): 750 mg via INTRAVENOUS
  Filled 2013-11-08 (×2): qty 150

## 2013-11-08 MED ORDER — POTASSIUM CHLORIDE CRYS ER 20 MEQ PO TBCR
40.0000 meq | EXTENDED_RELEASE_TABLET | Freq: Two times a day (BID) | ORAL | Status: DC
  Administered 2013-11-08 – 2013-11-13 (×11): 40 meq via ORAL
  Filled 2013-11-08 (×14): qty 2

## 2013-11-08 MED ORDER — CEFAZOLIN SODIUM 1-5 GM-% IV SOLN
1.0000 g | Freq: Two times a day (BID) | INTRAVENOUS | Status: DC
  Administered 2013-11-08 – 2013-11-22 (×29): 1 g via INTRAVENOUS
  Filled 2013-11-08 (×30): qty 50

## 2013-11-08 MED ORDER — VANCOMYCIN HCL IN DEXTROSE 1-5 GM/200ML-% IV SOLN
1000.0000 mg | Freq: Once | INTRAVENOUS | Status: AC
  Administered 2013-11-08: 1000 mg via INTRAVENOUS
  Filled 2013-11-08: qty 200

## 2013-11-08 MED ORDER — SIMVASTATIN 40 MG PO TABS
40.0000 mg | ORAL_TABLET | Freq: Every day | ORAL | Status: DC
  Administered 2013-11-08 – 2013-11-17 (×10): 40 mg via ORAL
  Filled 2013-11-08 (×11): qty 1

## 2013-11-08 MED ORDER — FUROSEMIDE 10 MG/ML IJ SOLN
80.0000 mg | Freq: Two times a day (BID) | INTRAMUSCULAR | Status: DC
  Administered 2013-11-08 – 2013-11-12 (×9): 80 mg via INTRAVENOUS
  Filled 2013-11-08 (×13): qty 8

## 2013-11-08 NOTE — Progress Notes (Signed)
  ID input appreciated.  PICC pulled. Will infuse milrinone through PIV. Can go to tele.  Replace PICC 48 hours.  Will consider TEE prior to d/c.  Shaune Pascal Amiyah Shryock,MD 4:42 PM

## 2013-11-08 NOTE — Progress Notes (Signed)
Blood culture + gram cocci in cluster. NP aware with orders.

## 2013-11-08 NOTE — Progress Notes (Signed)
Transferred to [place] room 02 by wheelchair, stable, belongings with pt, report given to RN.

## 2013-11-08 NOTE — Progress Notes (Signed)
ANTIBIOTIC CONSULT NOTE - INITIAL  Pharmacy Consult for ABX Indication: Prob staph bacteremia  Allergies  Allergen Reactions  . Lisinopril Rash    Patient Measurements: Height: 5\' 7"  (170.2 cm) Weight: 160 lb 11.5 oz (72.9 kg) IBW/kg (Calculated) : 66.1 Adjusted Body Weight:   Vital Signs: Temp: 98.5 F (36.9 C) (05/22 0758) Temp src: Oral (05/22 0758) BP: 82/49 mmHg (05/22 0900) Pulse Rate: 80 (05/22 0900) Intake/Output from previous day: 05/21 0701 - 05/22 0700 In: 975.4 [P.O.:360; I.V.:615.4] Out: 720 [Urine:720] Intake/Output from this shift: Total I/O In: 117.4 [P.O.:100; I.V.:17.4] Out: -   Labs:  Recent Labs  11/07/13 0708 11/07/13 2015 11/08/13 0500  WBC 14.6* 9.6  --   HGB 13.6 12.3*  --   PLT 199 157  --   CREATININE 2.33* 1.95* 1.91*   Estimated Creatinine Clearance: 29.3 ml/min (by C-G formula based on Cr of 1.91). No results found for this basename: VANCOTROUGH, Corlis Leak, VANCORANDOM, [place], GENTPEAK, GENTRANDOM, TOBRATROUGH, TOBRAPEAK, TOBRARND, AMIKACINPEAK, AMIKACINTROU, AMIKACIN,  in the last 72 hours   Microbiology: Recent Results (from the past 720 hour(s))  CULTURE, BLOOD (ROUTINE X 2)     Status: None   Collection Time    11/07/13  2:40 PM      Result Value Ref Range Status   Specimen Description BLOOD RIGHT ARM   Final   Special Requests BOTTLES DRAWN AEROBIC AND ANAEROBIC 10CC   Final   Culture  Setup Time     Final   Value: 11/07/2013 20:32     Performed at [facility]   Culture     Final   Value: [place] IN CLUSTERS     Note: Gram Stain Report Called to,Read Back By and Verified With: CHAT D@10 :16AM ON 11/08/13 BY DANTS     Performed at [facility]   Report Status PENDING   Incomplete  CULTURE, BLOOD (ROUTINE X 2)     Status: None   Collection Time    11/07/13  2:55 PM      Result Value Ref Range Status   Specimen Description BLOOD RIGHT ANTECUBITAL   Final   Special Requests BOTTLES DRAWN  AEROBIC AND ANAEROBIC 10CC   Final   Culture  Setup Time     Final   Value: 11/07/2013 20:31     Performed at [facility]   Culture     Final   Value: [place] IN CLUSTERS     Note: Gram Stain Report Called to,Read Back By and Verified With: CHAT D@10 :16AM ON 11/08/13 BY DANTS     Performed at [facility]   Report Status PENDING   Incomplete  MRSA PCR SCREENING     Status: None   Collection Time    11/07/13  3:48 PM      Result Value Ref Range Status   MRSA by PCR NEGATIVE  NEGATIVE Final   Comment:            The GeneXpert MRSA Assay (FDA     approved for NASAL specimens     only), is one component of a     comprehensive MRSA colonization     surveillance program. It is not     intended to diagnose MRSA     infection nor to guide or     monitor treatment for     MRSA infections.    Medical History: Past Medical History  Diagnosis Date  . HTN (hypertension)   .  High cholesterol   . CHF (congestive heart failure)   . BBBB (bilateral bundle branch block)   . LBBB (left bundle branch block)   . Weakness   . Tired   . Seizures 06/11/2012    new onset/notes (06/11/2012)  . SOB (shortness of breath)     "sometimes when I lay down;; related to not taking my RX" (06/11/2012)  . Myocardial infarction     06/10/2012  . Atrial fibrillation   . Stroke   . Atrial thrombus     left  . Pulmonary HTN     Medications:  Prescriptions prior to admission  Medication Sig Dispense Refill  . carvedilol (COREG) 6.25 MG tablet 6.25 mg 2 (two) times daily with a meal.       . Choline Fenofibrate (FENOFIBRIC ACID) 45 MG CPDR TAKE 1 CAPSULE BY MOUTH EVERY DAY  30 capsule  3  . loratadine (CLARITIN) 10 MG tablet TAKE 1 TABLET BY MOUTH DAILY TAKE 1 TABLET BY MOUTH DAILY  30 tablet  6  . losartan (COZAAR) 25 MG tablet Take 12.5 mg by mouth 2 (two) times daily.      . magnesium oxide (MAG-OX) 400 MG tablet Take 1 tablet (400 mg total) by mouth daily.  30 tablet  6   . metolazone (ZAROXOLYN) 2.5 MG tablet Take 1 tablet (2.5 mg total) by mouth 2 (two) times a week. Every Sat and Tue  10 tablet  3  . potassium chloride SA (K-DUR,KLOR-CON) 20 MEQ tablet Take 2 tablets (40 mEq total) by mouth daily. TAKE EXTRA TAB WHEN YOU TAKE METOLAZONE  75 tablet  3  . simvastatin (ZOCOR) 40 MG tablet Take 40 mg by mouth every evening.      Marland Kitchen spironolactone (ALDACTONE) 25 MG tablet Take 1 tablet (25 mg total) by mouth daily.  30 tablet  6  . Tadalafil, PAH, 20 MG TABS Take 2 tablets (40 mg total) by mouth daily.  60 tablet  6  . torsemide (DEMADEX) 20 MG tablet Take 2 tablets (40 mg total) by mouth 2 (two) times daily.  120 tablet  3  . traMADol (ULTRAM) 50 MG tablet TAKE 1 TABLET BY MOUTH TWICE DAILY AS NEEDED FOR PAIN  60 tablet  1  . warfarin (COUMADIN) 4 MG tablet Take 1 tablet on all days except 1 and 1/2 tablets on Friday or as directed by coumadin clinic.  35 tablet  3   Scheduled:  . carvedilol  3.125 mg Oral BID WC  .  ceFAZolin (ANCEF) IV  1 g Intravenous Q12H  . furosemide  80 mg Oral BID  . potassium chloride  40 mEq Oral BID  . potassium chloride  40 mEq Oral Q4H  . simvastatin  40 mg Oral q1800  . Tadalafil (PAH)  40 mg Oral Daily  . vancomycin  1,000 mg Intravenous Once  . [START ON 11/09/2013] vancomycin  750 mg Intravenous Q24H  . Warfarin - Pharmacist Dosing Inpatient   Does not apply q1800   Assessment: Pt came in yesterday for increasing dyspnea. This AM his blood culture came back with GPC 2/2. He is pretty high risk because of the PICC line and as well as his ICD. D/w Dr Haroldine Laws this AM about adding ancef to his vanc that was started this AM in case this is MSSA. We'll also will get ID involve also.    Plan:   Start Cefazolin 1g IV q12 F/u with cultures Page ID

## 2013-11-08 NOTE — Consult Note (Signed)
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 He needs eval for endocarditis and pace maker infection. He does not look unstable at this time.   Thank you so much for this interesting consult,   Campbell Riches (pager) [phone] [email or web address]  Tyler Christensen is an 80 y.o. male.  HPI: 80 yo M with hx CHF (EF 20% 02-2013), pacer 2000, on home milrinone, portal htn, PAF/on coumadin. He has been gaining wt recently, care complicated by increasing Cr.  Comes to ED on 5-21 with increasing dyspnea, abd girth, nausea, and wt gain. His WBC was 14.6 and his temp was 102.9.  He was started on vanco/ancef today.  His case is also complicated by loose BM. C diff is (-).  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    Past Medical History  Diagnosis Date  . HTN (hypertension)   . High cholesterol   . CHF (congestive heart failure)   . BBBB (bilateral bundle branch block)   . LBBB (left bundle branch block)   . Weakness   . Tired   . Seizures 06/11/2012    new onset/notes (06/11/2012)  . SOB (shortness of breath)     "sometimes when I lay down;; related to not taking my RX" (06/11/2012)  . Myocardial infarction     06/10/2012  . Atrial fibrillation   . Stroke   . Atrial thrombus     left  . Pulmonary HTN     Past Surgical History  Procedure Laterality Date  . Throat surgery  1942    "and nose" (06/11/2012); ?T&A  . Inguinal hernia repair  ~ 2008    "both sides" (06/11/2012)  . Tee without cardioversion   06/29/2012    Procedure: TRANSESOPHAGEAL ECHOCARDIOGRAM (TEE);  Surgeon: Jolaine Artist, MD;  Location: [facility] ENDOSCOPY;  Service: Cardiovascular;  Laterality: N/A;  will need a spanish interpreter Interpreter will be here at 1330-Hope spoke w/ [place]  . Cardioversion N/A 08/14/2012    Procedure: CARDIOVERSION;  Surgeon: Jolaine Artist, MD;  Location: [facility] ENDOSCOPY;  Service: Cardiovascular;  Laterality: N/A;     Allergies  Allergen Reactions  . Lisinopril Rash    Medications:  Scheduled: . carvedilol  3.125 mg Oral BID WC  .  ceFAZolin (ANCEF) IV  1 g Intravenous Q12H  . furosemide  80 mg Intravenous BID  . magnesium sulfate 1 - 4 g bolus IVPB  2 g Intravenous Once  . potassium chloride  40 mEq Oral BID  . simvastatin  40 mg Oral q1800  . Tadalafil (PAH)  40 mg Oral Daily  . [START ON 11/09/2013] vancomycin  750 mg Intravenous Q24H    Abtx:  Anti-infectives   Start     Dose/Rate Route Frequency Ordered Stop   11/09/13 1100  vancomycin (VANCOCIN) IVPB 750 mg/150 ml premix     750 mg 150 mL/hr over 60 Minutes Intravenous Every 24 hours 11/08/13 1034     11/08/13 1200  ceFAZolin (ANCEF) IVPB 1 g/50 mL premix     1  g 100 mL/hr over 30 Minutes Intravenous Every 12 hours 11/08/13 1103     11/08/13 1100  vancomycin (VANCOCIN) IVPB 1000 mg/200 mL premix     1,000 mg 200 mL/hr over 60 Minutes Intravenous  Once 11/08/13 1034 11/08/13 1259      Total days of antibiotics 1 (ancef/vanco)          Social History:  reports that he has never smoked. He has never used smokeless tobacco. He reports that he does not drink alcohol or use illicit drugs.  History reviewed. No pertinent family history.  General ROS: see HPI, loose BM, no dysuria.   Blood pressure 106/75, pulse 88, temperature 97.6 F (36.4 C), temperature source Oral, resp. rate 14, height _0  (1.702 m), weight 72.9 kg (160 lb 11.5 oz), SpO2 100.00%. General appearance: alert, cooperative and no distress Eyes:  negative findings: pupils equal, round, reactive to light and accomodation Throat: normal findings: oropharynx pink & moist without lesions or evidence of thrush Neck: no adenopathy and supple, symmetrical, trachea midline Lungs: diminished breath sounds bilaterally Chest wall: mild tenderness over pacer site, no fluctuance, no erythema.  Heart: regular rate and rhythm Extremities: edema none and LUE PIC is clean, no d/c. no tenderness. no erythema.    Results for orders placed during the hospital encounter of 11/07/13 (from the past 48 hour(s))  CBC WITH DIFFERENTIAL     Status: Abnormal   Collection Time    11/07/13  7:08 AM      Result Value Ref Range   WBC 14.6 (*) 4.0 - 10.5 K/uL   RBC 4.39  4.22 - 5.81 MIL/uL   Hemoglobin 13.6  13.0 - 17.0 g/dL   HCT 40.9  39.0 - 52.0 %   MCV 93.2  78.0 - 100.0 fL   MCH 31.0  26.0 - 34.0 pg   MCHC 33.3  30.0 - 36.0 g/dL   RDW 15.1  11.5 - 15.5 %   Platelets 199  150 - 400 K/uL   Neutrophils Relative % 95 (*) 43 - 77 %   Neutro Abs 13.8 (*) 1.7 - 7.7 K/uL   Lymphocytes Relative 3 (*) 12 - 46 %   Lymphs Abs 0.5 (*) 0.7 - 4.0 K/uL   Monocytes Relative 2 (*) 3 - 12 %   Monocytes Absolute 0.2  0.1 - 1.0 K/uL   Eosinophils Relative 0  0 - 5 %   Eosinophils Absolute 0.0  0.0 - 0.7 K/uL   Basophils Relative 0  0 - 1 %   Basophils Absolute 0.0  0.0 - 0.1 K/uL  COMPREHENSIVE METABOLIC PANEL     Status: Abnormal   Collection Time    11/07/13  7:08 AM      Result Value Ref Range   Sodium 143  137 - 147 mEq/L   Potassium 3.4 (*) 3.7 - 5.3 mEq/L   Chloride 98  96 - 112 mEq/L   CO2 29  19 - 32 mEq/L   Glucose, Bld 150 (*) 70 - 99 mg/dL   BUN 87 (*) 6 - 23 mg/dL   Creatinine, Ser 2.33 (*) 0.50 - 1.35 mg/dL   Calcium 9.3  8.4 - 10.5 mg/dL   Total Protein 7.3  6.0 - 8.3 g/dL   Albumin 3.8  3.5 - 5.2 g/dL   AST 19  0 - 37 U/L   ALT 12  0 - 53 U/L   Alkaline Phosphatase 53  39 - 117 U/L   Total Bilirubin  0.8  0.3 - 1.2 mg/dL   GFR calc non Af  Amer 25 (*) >90 mL/min   GFR calc Af Amer 29 (*) >90 mL/min   Comment: (NOTE)     The eGFR has been calculated using the CKD EPI equation.     This calculation has not been validated in all clinical situations.     eGFR's persistently <90 mL/min signify possible Chronic Kidney     Disease.  LIPASE, BLOOD     Status: Abnormal   Collection Time    11/07/13  7:08 AM      Result Value Ref Range   Lipase 95 (*) 11 - 59 U/L  PROTIME-INR     Status: Abnormal   Collection Time    11/07/13  7:08 AM      Result Value Ref Range   Prothrombin Time 22.1 (*) 11.6 - 15.2 seconds   INR 2.00 (*) 0.00 - 1.49  PRO B NATRIURETIC PEPTIDE     Status: Abnormal   Collection Time    11/07/13  7:08 AM      Result Value Ref Range   Pro B Natriuretic peptide (BNP) 9356.0 (*) 0 - 450 pg/mL  I-STAT TROPOININ, ED     Status: None   Collection Time    11/07/13  7:12 AM      Result Value Ref Range   Troponin i, poc 0.03  0.00 - 0.08 ng/mL   Comment 3            Comment: Due to the release kinetics of cTnI,     a negative result within the first hours     of the onset of symptoms does not rule out     myocardial infarction with certainty.     If myocardial infarction is still suspected,     repeat the test at appropriate intervals.  URINALYSIS, ROUTINE W REFLEX MICROSCOPIC     Status: Abnormal   Collection Time    11/07/13 10:50 AM      Result Value Ref Range   Color, Urine YELLOW  YELLOW   APPearance CLEAR  CLEAR   Specific Gravity, Urine 1.019  1.005 - 1.030   pH 5.5  5.0 - 8.0   Glucose, UA 100 (*) NEGATIVE mg/dL   Hgb urine dipstick SMALL (*) NEGATIVE   Bilirubin Urine NEGATIVE  NEGATIVE   Ketones, ur NEGATIVE  NEGATIVE mg/dL   Protein, ur NEGATIVE  NEGATIVE mg/dL   Urobilinogen, UA 0.2  0.0 - 1.0 mg/dL   Nitrite NEGATIVE  NEGATIVE   Leukocytes, UA NEGATIVE  NEGATIVE  URINE MICROSCOPIC-ADD ON     Status: None   Collection Time    11/07/13 10:50 AM      Result Value Ref Range   Squamous  Epithelial / LPF RARE  RARE   WBC, UA 0-2  <3 WBC/hpf   RBC / HPF 0-2  <3 RBC/hpf   Bacteria, UA RARE  RARE  CULTURE, BLOOD (ROUTINE X 2)     Status: None   Collection Time    11/07/13  2:40 PM      Result Value Ref Range   Specimen Description BLOOD RIGHT ARM     Special Requests BOTTLES DRAWN AEROBIC AND ANAEROBIC 10CC     Culture  Setup Time       Value: 11/07/2013 20:32     Performed at [facility]   Culture       Value: [place]  Note: Gram Stain Report Called to,Read Back By and Verified With: CHAT D_0 :16AM ON 11/08/13 BY DANTS     Performed at [facility]   Report Status PENDING    CULTURE, BLOOD (ROUTINE X 2)     Status: None   Collection Time    11/07/13  2:55 PM      Result Value Ref Range   Specimen Description BLOOD RIGHT ANTECUBITAL     Special Requests BOTTLES DRAWN AEROBIC AND ANAEROBIC 10CC     Culture  Setup Time       Value: 11/07/2013 20:31     Performed at [facility]   Culture       Value: [place] IN CL[place]ERS     Note: Gram Stain Report Called to,Read Back By and Verified With: CHAT D_1 :16AM ON 11/08/13 BY DANTS     Performed at [facility]   Report Status PENDING    MRSA PCR SCREENING     Status: None   Collection Time    11/07/13  3:48 PM      Result Value Ref Range   MRSA by PCR NEGATIVE  NEGATIVE   Comment:            The GeneXpert MRSA Assay (FDA     approved for NASAL specimens     only), is one component of a     comprehensive MRSA colonization     surveillance program. It is not     intended to diagnose MRSA     infection nor to guide or     monitor treatment for     MRSA infections.  CARBOXYHEMOGLOBIN     Status: None   Collection Time    11/07/13  7:00 PM      Result Value Ref Range   Total hemoglobin 13.8  13.5 - 18.0 g/dL   O2 Saturation 47.1     Carboxyhemoglobin 1.4  0.5 - 1.5 %   Methemoglobin 0.8  0.0 - 1.5 %  BASIC METABOLIC PANEL     Status: Abnormal     Collection Time    11/07/13  8:15 PM      Result Value Ref Range   Sodium 140  137 - 147 mEq/L   Potassium 3.3 (*) 3.7 - 5.3 mEq/L   Chloride 95 (*) 96 - 112 mEq/L   CO2 27  19 - 32 mEq/L   Glucose, Bld 156 (*) 70 - 99 mg/dL   BUN 69 (*) 6 - 23 mg/dL   Creatinine, Ser 1.95 (*) 0.50 - 1.35 mg/dL   Calcium 9.2  8.4 - 10.5 mg/dL   GFR calc non Af Amer 31 (*) >90 mL/min   GFR calc Af Amer 36 (*) >90 mL/min   Comment: (NOTE)     The eGFR has been calculated using the CKD EPI equation.     This calculation has not been validated in all clinical situations.     eGFR's persistently <90 mL/min signify possible Chronic Kidney     Disease.  CBC     Status: Abnormal   Collection Time    11/07/13  8:15 PM      Result Value Ref Range   WBC 9.6  4.0 - 10.5 K/uL   RBC 4.09 (*) 4.22 - 5.81 MIL/uL   Hemoglobin 12.3 (*) 13.0 - 17.0 g/dL   HCT 38.2 (*) 39.0 - 52.0 %   MCV 93.4  78.0 - 100.0 fL   MCH 30.1  26.0 - 34.0 pg   MCHC 32.2  30.0 - 36.0 g/dL   RDW 15.4  11.5 - 15.5 %   Platelets 157  150 - 400 K/uL  PROTIME-INR     Status: Abnormal   Collection Time    11/07/13  8:15 PM      Result Value Ref Range   Prothrombin Time 25.6 (*) 11.6 - 15.2 seconds   INR 2.43 (*) 0.00 - 5.28  BASIC METABOLIC PANEL     Status: Abnormal   Collection Time    11/08/13  5:00 AM      Result Value Ref Range   Sodium 140  137 - 147 mEq/L   Potassium 2.7 (*) 3.7 - 5.3 mEq/L   Comment: REPEATED TO VERIFY     CRITICAL RESULT CALLED TO, READ BACK BY AND VERIFIED WITH:     G.HODGIN,RN [id number] 11/08/13 CLARK,S   Chloride 97  96 - 112 mEq/L   CO2 29  19 - 32 mEq/L   Glucose, Bld 151 (*) 70 - 99 mg/dL   BUN 67 (*) 6 - 23 mg/dL   Creatinine, Ser 1.91 (*) 0.50 - 1.35 mg/dL   Calcium 8.4  8.4 - 10.5 mg/dL   GFR calc non Af Amer 32 (*) >90 mL/min   GFR calc Af Amer 37 (*) >90 mL/min   Comment: (NOTE)     The eGFR has been calculated using the CKD EPI equation.     This calculation has not been validated in all  clinical situations.     eGFR's persistently <90 mL/min signify possible Chronic Kidney     Disease.  MAGNESIUM     Status: None   Collection Time    11/08/13  5:00 AM      Result Value Ref Range   Magnesium 1.8  1.5 - 2.5 mg/dL  CARBOXYHEMOGLOBIN     Status: Abnormal   Collection Time    11/08/13  6:49 AM      Result Value Ref Range   Total hemoglobin 12.4 (*) 13.5 - 18.0 g/dL   O2 Saturation 59.2     Carboxyhemoglobin 1.6 (*) 0.5 - 1.5 %   Methemoglobin 0.7  0.0 - 1.5 %  PROTIME-INR     Status: Abnormal   Collection Time    11/08/13 11:25 AM      Result Value Ref Range   Prothrombin Time 29.0 (*) 11.6 - 15.2 seconds   INR 2.86 (*) 0.00 - 1.49  CLOSTRIDIUM DIFFICILE BY PCR     Status: None   Collection Time    11/08/13 12:03 PM      Result Value Ref Range   C difficile by pcr NEGATIVE  NEGATIVE      Component Value Date/Time   SDES BLOOD RIGHT ANTECUBITAL 11/07/2013 1455   SPECREQUEST BOTTLES DRAWN AEROBIC AND ANAEROBIC 10CC 11/07/2013 1455   CULT  Value: GRAM POSITIVE COCCI IN CLUSTERS Note: Gram Stain Report Called to,Read Back By and Verified With: CHAT D_0 :16AM ON 11/08/13 BY DANTS Performed at [place] 11/07/2013 1455   REPTSTATUS PENDING 11/07/2013 1455   Ct Abdomen Pelvis Wo Contrast  11/07/2013   CLINICAL DATA:  Abdominal pain with nausea.  EXAM: CT ABDOMEN AND PELVIS WITHOUT CONTRAST  TECHNIQUE: Multidetector CT imaging of the abdomen and pelvis was performed following the standard protocol without IV contrast.  COMPARISON:  None.  FINDINGS: There is cardiomegaly.  Is a tiny hiatal hernia.  There is suggestion of very tiny subtle stones  in the gallbladder. There is no dilatation of the biliary tree in the gallbladder wall is not thickened in the gallbladder is not distended. Liver is normal. There are calcified granulomas in the otherwise normal spleen. Pancreas and adrenal glands are normal. There are multiple diverticula scattered throughout the colon. The  bowel is otherwise normal.  There are multiple low-density lesions in the right kidney, probably cysts, including an 11 mm lesion in the mid right kidney, a 3.8 mm lesion in the lower pole medially and 15 mm lesion laterally in the lower pole. There is a 5 cm cyst in the anterior aspect of the mid left kidney and exophytic 10 mm lesion on the left lower pole.  There is extensive calcification in the abdominal aorta and common iliac arteries. No acute osseous abnormality.  There is a 6.0 x 4.5 x 2.7 cm primarily fatty mass in the retroperitoneum between the upper pole of the right kidney and the upper lumbar spine and proximal soleus muscle. I suspect this represents a liposarcoma. This was not present on the prior CT scan of 03/28/2004.  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  Possible tiny gallstones.  Probable bilateral renal cysts. This could be confirmed with ultrasound.  Diverticulosis.  No acute diverticulitis.   Electronically Signed   By: Rozetta Nunnery M.D.   On: 11/07/2013 11:15   Dg Chest 2 View  11/07/2013   CLINICAL DATA:  Chest pain, abdominal pain  EXAM: CHEST  2 VIEW  COMPARISON:  10/27/2012  FINDINGS: Cardiomegaly again noted. Three leads cardiac pacemaker is unchanged in position. Left arm PICC line stable in position. No acute infiltrate or pulmonary edema. Mild degenerative changes thoracic spine again noted.  IMPRESSION: Cardiomegaly again noted. No acute infiltrate or pulmonary edema. Stable left arm PICC line position.   Electronically Signed   By: Lahoma Crocker M.D.   On: 11/07/2013 07:00   Recent Results (from the past 240 hour(s))  CULTURE, BLOOD (ROUTINE X 2)     Status: None   Collection Time    11/07/13  2:40 PM      Result Value Ref Range Status   Specimen Description BLOOD RIGHT ARM   Final   Special Requests BOTTLES DRAWN AEROBIC AND ANAEROBIC 10CC   Final   Culture  Setup Time     Final   Value: 11/07/2013 20:32     Performed at [facility]   Culture     Final   Value: GRAM POSITIVE COCCI IN CLUSTERS     Note: Gram Stain Report Called to,Read Back By and Verified With: CHAT D_0 :16AM ON 11/08/13 BY DANTS     Performed at [facility]   Report Status PENDING   Incomplete  CULTURE, BLOOD (ROUTINE X 2)     Status: None   Collection Time    11/07/13  2:55 PM      Result Value Ref Range Status   Specimen Description BLOOD RIGHT ANTECUBITAL   Final   Special Requests BOTTLES DRAWN AEROBIC AND ANAEROBIC 10CC   Final   Culture  Setup Time     Final   Value: 11/07/2013 20:31     Performed at [facility]   Culture     Final   Value: GRAM POSITIVE COCCI IN CLUSTERS     Note: Gram Stain Report Called to,Read Back By and Verified With: CHAT D_1 :16AM ON 11/08/13 BY DANTS     Performed at [facility]  Report Status PENDING   Incomplete  MRSA PCR SCREENING     Status: None   Collection Time    11/07/13  3:48 PM      Result Value Ref Range Status   MRSA by PCR NEGATIVE  NEGATIVE Final   Comment:            The GeneXpert MRSA Assay (FDA     approved for NASAL specimens     only), is one component of a     comprehensive MRSA colonization     surveillance program. It is not     intended to diagnose MRSA     infection nor to guide or     monitor treatment for     MRSA infections.  CLOSTRIDIUM DIFFICILE BY PCR     Status: None   Collection Time    11/08/13 12:03 PM      Result Value Ref Range Status   C difficile by pcr NEGATIVE  NEGATIVE Final      11/08/2013, 3:18 PM     LOS: 1 day        [place] [facility]icrobial Management Team Staphylococcus aureus bacteremia   Staphylococcus aureus bacteremia (SAB) is associated with a high rate of complications and mortality.  Specific aspects of clinical management are critical to optimizing the outcome of patients with SAB.  Therefore, the [facility] Antimicrobial Management Team [facility]) has initiated an intervention aimed at improving the  management of SAB at [facility].  To do so, Infectious Diseases physicians are providing an evidence-based consult for the management of all patients with SAB.     Yes No Comments  Perform follow-up blood cultures (even if the patient is afebrile) to ensure clearance of bacteremia _0  _1    Remove vascular catheter and obtain follow-up blood cultures after the removal of the catheter _2  _3    Perform echocardiography to evaluate for endocarditis (transthoracic ECHO is 40-50% sensitive, TEE is > 90% sensitive) _4  _5  Please keep in mind, that neither test can definitively EXCLUDE endocarditis, and that should clinical suspicion remain high for endocarditis the patient should then still be treated with an "endocarditis" duration of therapy = 6 weeks  Consult electrophysiologist to evaluate implanted cardiac device (pacemaker, ICD) _6  _7    Ensure source control _8  _9  Have all abscesses been drained effectively? Have deep seeded infections (septic joints or osteomyelitis) had appropriate surgical debridement?  Investigate for "metastatic" sites of infection _10  _11  Does the patient have ANY symptom or physical exam finding that would suggest a deeper infection (back or neck pain that may be suggestive of vertebral osteomyelitis or epidural abscess, muscle pain that could be a symptom of pyomyositis)?  Keep in mind that for deep seeded infections MRI imaging with contrast is preferred rather than other often insensitive tests such as plain x-rays, especially early in a patient's presentation.  Change antibiotic therapy to __________________ _12  _13  Beta-lactam antibiotics are preferred for MSSA due to higher cure rates.   If on Vancomycin, goal trough should be 15 - 20 mcg/mL  Estimated duration of IV antibiotic therapy:   _14  _15  Consult case management for probably prolonged outpatient IV antibiotic therapy     **Disclaimer: This note may have been dictated with voice recognition software. Similar sounding  words can inadvertently be transcribed and this note may contain transcription errors which may not have been corrected upon publication of note.**

## 2013-11-08 NOTE — Progress Notes (Addendum)
Advanced Heart Failure Rounding Note   Subjective:    [name] is a 80 y.o. [name] (non-english speaking)well known to the HF team with a history of chronic systolic CHF (ECHO 07/6832 EF 20%) on chronic home Milrinone 0.25 mcg, NICM, LBBB, PAF (on coumadin), HTN, HLD, CVA, Pulmonary Hypertension on tadalafil, PAF last DC-CV 08/14/2013 on chronic coumadin, and h/o seizure d/o (2/2 CVA 05/2012).   He had been followed closely in the HF clinic and was last seen 10/23/13. At that time he had increased weight and dyspnea on exertion. His diuretic regimen has been complicated by worsening renal function. He has been on home Milrinone continued of mirlrinone 0.25 mcg via PICC. Weight in the office was 167 pounds. He was also set up for [place]. He is followed by [facility] for home inotropes since January 2014.   Yesterday he was admitted with increased dyspnea and increased abdominal bloating and dyspnea. Over night had fever 102.9. Blood and urine cultures obtained. UA ok. Milrinone remains 0.25 mcg.  Weight unchanged.   CVP 12. Mild abdominal pain improving. + diarrhea.  Denies SOB   Urine Cultures NGTD  Blood Cultures 2/2 GPC UA - ok  CO-OX 47>59%  Creatinine1.9>1.9   Objective:   Weight Range:  Vital Signs:   Temp:  [98.5 F (36.9 C)-102.9 F (39.4 C)] 98.5 F (36.9 C) (05/22 0758) Pulse Rate:  [41-104] 87 (05/22 0758) Resp:  [14-41] 16 (05/22 0758) BP: (59-112)/(35-81) 105/55 mmHg (05/22 0758) SpO2:  [86 %-100 %] 100 % (05/22 0758) Weight:  [160 lb 11.5 oz (72.9 kg)] 160 lb 11.5 oz (72.9 kg) (05/22 0500) Last BM Date: 11/07/13  Weight change: Filed Weights   11/07/13 1554 11/07/13 1917 11/08/13 0500  Weight: 160 lb 11.5 oz (72.9 kg) 160 lb 11.5 oz (72.9 kg) 160 lb 11.5 oz (72.9 kg)    Intake/Output:   Intake/Output Summary (Last 24 hours) at 11/08/13 1962 Last data filed at 11/08/13 0600  Gross per 24 hour  Intake  969.6 ml  Output    720 ml  Net  249.6 ml     Physical Exam: CVP  ~12 General: elderly NAD  HEENT: normal  Neck: supple. JVD ~12. Carotids 2+ bilat; no bruits. No lymphadenopathy or thryomegaly appreciated.  Cor: PMI nondisplaced. Irregular rate & rhythm. No rubs, or murmurs.+ S3  Lungs: Clear. 2 liters Colon  Abdomen: soft, nontender, + distended. Abdominal tenderness. No hepatosplenomegaly. No bruits or masses. Good bowel sounds.  Extremities: no cyanosis, clubbing, rash, edema RUE PICC  Neuro: alert & oriented x 3, cranial nerves grossly intact. moves all 4 extremities w/o difficulty. Affect pleasant.  ECG: A fib 90s AV paced    Labs: Basic Metabolic Panel:  Recent Labs Lab 11/07/13 0708 11/07/13 2015 11/08/13 0500  NA 143 140 140  K 3.4* 3.3* 2.7*  CL 98 95* 97  CO2 29 27 29   GLUCOSE 150* 156* 151*  BUN 87* 69* 67*  CREATININE 2.33* 1.95* 1.91*  CALCIUM 9.3 9.2 8.4    Liver Function Tests:  Recent Labs Lab 11/07/13 0708  AST 19  ALT 12  ALKPHOS 53  BILITOT 0.8  PROT 7.3  ALBUMIN 3.8    Recent Labs Lab 11/07/13 0708  LIPASE 95*   No results found for this basename: AMMONIA,  in the last 168 hours  CBC:  Recent Labs Lab 11/07/13 0708 11/07/13 2015  WBC 14.6* 9.6  NEUTROABS 13.8*  --   HGB 13.6 12.3*  HCT 40.9  38.2*  MCV 93.2 93.4  PLT 199 157    Cardiac Enzymes: No results found for this basename: CKTOTAL, CKMB, CKMBINDEX, TROPONINI,  in the last 168 hours  BNP: BNP (last 3 results)  Recent Labs  11/07/13 0708  PROBNP 9356.0*     Other results:    Imaging: Ct Abdomen Pelvis Wo Contrast  11/07/2013   CLINICAL DATA:  Abdominal pain with nausea.  EXAM: CT ABDOMEN AND PELVIS WITHOUT CONTRAST  TECHNIQUE: Multidetector CT imaging of the abdomen and pelvis was performed following the standard protocol without IV contrast.  COMPARISON:  None.  FINDINGS: There is cardiomegaly.  Is a tiny hiatal hernia.  There is suggestion of very tiny subtle stones in the gallbladder. There is no dilatation of the biliary  tree in the gallbladder wall is not thickened in the gallbladder is not distended. Liver is normal. There are calcified granulomas in the otherwise normal spleen. Pancreas and adrenal glands are normal. There are multiple diverticula scattered throughout the colon. The bowel is otherwise normal.  There are multiple low-density lesions in the right kidney, probably cysts, including an 11 mm lesion in the mid right kidney, a 3.8 mm lesion in the lower pole medially and 15 mm lesion laterally in the lower pole. There is a 5 cm cyst in the anterior aspect of the mid left kidney and exophytic 10 mm lesion on the left lower pole.  There is extensive calcification in the abdominal aorta and common iliac arteries. No acute osseous abnormality.  There is a 6.0 x 4.5 x 2.7 cm primarily fatty mass in the retroperitoneum between the upper pole of the right kidney and the upper lumbar spine and proximal soleus muscle. I suspect this represents a liposarcoma. This was not present on the prior CT scan of 03/28/2004.  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  Possible tiny gallstones.  Probable bilateral renal cysts. This could be confirmed with ultrasound.  Diverticulosis.  No acute diverticulitis.   Electronically Signed   By: Rozetta Nunnery M.D.   On: 11/07/2013 11:15   Dg Chest 2 View  11/07/2013   CLINICAL DATA:  Chest pain, abdominal pain  EXAM: CHEST  2 VIEW  COMPARISON:  10/27/2012  FINDINGS: Cardiomegaly again noted. Three leads cardiac pacemaker is unchanged in position. Left arm PICC line stable in position. No acute infiltrate or pulmonary edema. Mild degenerative changes thoracic spine again noted.  IMPRESSION: Cardiomegaly again noted. No acute infiltrate or pulmonary edema. Stable left arm PICC line position.   Electronically Signed   By: Lahoma Crocker M.D.   On: 11/07/2013 07:00      Medications:     Scheduled Medications: . carvedilol  3.125 mg Oral BID WC  . enoxaparin (LOVENOX)  injection  30 mg Subcutaneous Q24H  . furosemide  80 mg Oral BID  . Tadalafil (PAH)  40 mg Oral Daily  . Warfarin - Pharmacist Dosing Inpatient   Does not apply q1800     Infusions: . milrinone 0.25 mcg/kg/min (11/08/13 0600)     PRN Medications:  acetaminophen, ondansetron (ZOFRAN) IV   Assessment:  1. A/C Systolic Heart Failure - ECHO 2014 EF 20%  2. A/C respirartory failure - Desaturations noted with exertion to 82%  3. Pulmonary Arterial Hypertension- on chronic tadalafil 20 mg daily  4. Back in A fib-- Had succesful DC-CV 08/14/13. On chronic coumadin  5. CKD --creatinine baseline 1.9-2.0  6. S/P Med[employer] CRT-D  7. Hypotension  8. Ascites  9. Hypokalemia  10. Elevated WBCs -- Fever 102 11. Retroperitoneal Mass per CT . ? Liposarcoma 12. Staph basteremia 13. Diarrhea    Plan/Discussion:    Volume status ok. Weight unchanged. CVP 6-7. Stop IV lasix. Continue adcirca 40 mg daily. Renal function ok. He is not on ace due to CKD.   Remains in A fib. Rate controlled. INR pending. Coumadin per pharmacy. Continue carvedilol 3.1225 mg twice a day. Consider TEE-DC-CV after biopsy of mass.   Febrile overnight UA ok. Cultures NGTD.     Length of Stay: 1 [name] D Clegg NP-C  11/08/2013, 8:12 AM  Advanced Heart Failure Team Pager [phone] (M-F; 7a - 4p)  Please contact [facility] for night-coverage after hours (4p -7a ) and weekends on [email or web address]  Patient seen and examined with Darrick Grinder, NP. We discussed all aspects of the encounter. I agree with the assessment and plan as stated above.   Difficult case with multiple issues. Not sure what is causing his ab distension but has clearly felt worse since going back into AF 3-4 weeks ago and will likely need to consider TEE/DC-CV at some point. 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 Will d/w IR regarding bx of mass. Send stool for C. Diff.    Volume status looks ok by Optivol but CVP 12. Will continue IV lasix for now. Co-ox ok.   Hold warfarin. Heparin when INR < 2.0.  Shaune Pascal Kashden Deboy,MD 12:23 PM

## 2013-11-08 NOTE — Progress Notes (Signed)
K_2.7 -NP made aware, awaiting order. Continue to monitor.

## 2013-11-08 NOTE — Progress Notes (Signed)
Nutrition Brief Note  Patient identified on the Malnutrition Screening Tool (MST) Report for recent weight lost without trying (patient unsure).  Per readings below, pt's weight down from 10/23/13 more than likely given diuretic regimen.  Wt Readings from Last 15 Encounters:  11/08/13 160 lb 11.5 oz (72.9 kg)  10/23/13 167 lb 8 oz (75.978 kg)  10/10/13 165 lb 2 oz (74.9 kg)  09/13/13 164 lb 4 oz (74.503 kg)  08/16/13 159 lb 1.9 oz (72.176 kg)  07/17/13 162 lb 4 oz (73.596 kg)  06/26/13 159 lb 12.8 oz (72.485 kg)  05/06/13 154 lb 12.8 oz (70.217 kg)  04/09/13 144 lb (65.318 kg)  04/04/13 149 lb 8 oz (67.813 kg)  03/28/13 152 lb 12.8 oz (69.31 kg)  02/27/13 148 lb (67.132 kg)  02/21/13 148 lb (67.132 kg)  02/21/13 148 lb (67.132 kg)  02/14/13 149 lb 6.4 oz (67.767 kg)    Body mass index is 25.17 kg/(m^2). Patient meets criteria for Overweight based on current BMI.   Current diet order is 2 gm Sodium, patient is consuming approximately 100% of meals at this time. Labs and medications reviewed.   No nutrition interventions warranted at this time. If nutrition issues arise, please consult RD.   Arthur Holms, RD, LDN Pager #: [phone] After-Hours Pager #: [phone]

## 2013-11-08 NOTE — Progress Notes (Signed)
Md aware of runs of loose stool brown in color with no order. Continue to monitor.

## 2013-11-08 NOTE — Progress Notes (Addendum)
ANTICOAGULATION/ANTIBIOTIC CONSULT NOTE - Follow Up Consult  Pharmacy Consult for Vancomycin and Heparin when INR < 2 Indication: GPC in blood and atrial fibrillation  Allergies  Allergen Reactions  . Lisinopril Rash   Patient Measurements: Height: 5\' 7"  (170.2 cm) Weight: 160 lb 11.5 oz (72.9 kg) IBW/kg (Calculated) : 66.1  Vital Signs: Temp: 98.5 F (36.9 C) (05/22 0758) Temp src: Oral (05/22 0758) BP: 82/49 mmHg (05/22 0900) Pulse Rate: 80 (05/22 0900)  Labs:  Recent Labs  11/07/13 0708 11/07/13 2015 11/08/13 0500 11/08/13 1125  HGB 13.6 12.3*  --   --   HCT 40.9 38.2*  --   --   PLT 199 157  --   --   LABPROT 22.1* 25.6*  --  29.0*  INR 2.00* 2.43*  --  2.86*  CREATININE 2.33* 1.95* 1.91*  --     Estimated Creatinine Clearance: 29.3 ml/min (by C-G formula based on Cr of 1.91).  Assessment: 81yom known to the HF team admitted yesterday with abdominal bloating and dyspnea. On coumadin pta for afib which was resumed. 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  Blood cultures are also positive for GPC in clusters. He will be started on vancomycin and cefazolin, ID consult pending. He came in with ARF (sCr 2.33 on admit) which is now improving to 1.91.  5/22 Vancomycin>> 5/22 Cefazolin>> 5/21 blood x3>> 2/2 GPC in clusters 5/21 urine>>  Goal of Therapy:  INR 2-3 Heparin level 0.3-0.7 Vancomycin trough 15-20 Monitor platelets by anticoagulation protocol: Yes   Plan:  1) Hold coumadin, start heparin when INR<2 2) INR in AM 3) Vancomycin 1g x 1 then 750mg  q24 4) Follow renal function, cultures, LOT, level if needed  Tyler Christensen 11/08/2013,12:16 PM

## 2013-11-09 ENCOUNTER — Other Ambulatory Visit (HOSPITAL_COMMUNITY): Payer: Self-pay | Admitting: Adult Health

## 2013-11-09 LAB — URINE CULTURE: Special Requests: NORMAL

## 2013-11-09 LAB — BASIC METABOLIC PANEL
BUN: 71 mg/dL — AB (ref 6–23)
CO2: 29 mEq/L (ref 19–32)
Calcium: 8.8 mg/dL (ref 8.4–10.5)
Chloride: 97 mEq/L (ref 96–112)
Creatinine, Ser: 2 mg/dL — ABNORMAL HIGH (ref 0.50–1.35)
GFR calc Af Amer: 35 mL/min — ABNORMAL LOW (ref >90)
GFR calc non Af Amer: 30 mL/min — ABNORMAL LOW (ref >90)
GLUCOSE: 134 mg/dL — AB (ref 70–99)
POTASSIUM: 3.2 meq/L — AB (ref 3.7–5.3)
SODIUM: 139 meq/L (ref 137–147)

## 2013-11-09 LAB — PROTIME-INR
INR: 2.31 — AB (ref 0.00–1.49)
INR: 1.8 — AB (ref 0.00–1.49)
PROTHROMBIN TIME: 20.4 s — AB (ref 11.6–15.2)
Prothrombin Time: 24.6 seconds — ABNORMAL HIGH (ref 11.6–15.2)

## 2013-11-09 MED ORDER — HEPARIN (PORCINE) IN NACL 100-0.45 UNIT/ML-% IJ SOLN
1200.0000 [IU]/h | INTRAMUSCULAR | Status: DC
  Administered 2013-11-09: 1000 [IU]/h via INTRAVENOUS
  Administered 2013-11-10: 1200 [IU]/h via INTRAVENOUS
  Filled 2013-11-09 (×4): qty 250

## 2013-11-09 NOTE — Progress Notes (Signed)
INFECTIOUS DISEASE PROGRESS NOTE  ID: Tyler Christensen is a 80 y.o. male with  Active Problems:   Acute on chronic systolic heart failure  Subjective: Without complaints, feels better.  Abtx:  Anti-infectives   Start     Dose/Rate Route Frequency Ordered Stop   11/09/13 1100  vancomycin (VANCOCIN) IVPB 750 mg/150 ml premix     750 mg 150 mL/hr over 60 Minutes Intravenous Every 24 hours 11/08/13 1034     11/08/13 1200  ceFAZolin (ANCEF) IVPB 1 g/50 mL premix     1 g 100 mL/hr over 30 Minutes Intravenous Every 12 hours 11/08/13 1103     11/08/13 1100  vancomycin (VANCOCIN) IVPB 1000 mg/200 mL premix     1,000 mg 200 mL/hr over 60 Minutes Intravenous  Once 11/08/13 1034 11/08/13 1259      Medications:  Scheduled: . carvedilol  3.125 mg Oral BID WC  .  ceFAZolin (ANCEF) IV  1 g Intravenous Q12H  . furosemide  80 mg Intravenous BID  . potassium chloride  40 mEq Oral BID  . simvastatin  40 mg Oral q1800  . Tadalafil (PAH)  40 mg Oral Daily  . vancomycin  750 mg Intravenous Q24H    Objective: Vital signs in last 24 hours: Temp:  [97.6 F (36.4 C)-99.1 F (37.3 C)] 98.6 F (37 C) (05/23 0530) Pulse Rate:  [80-101] 87 (05/23 0530) Resp:  [14-24] 17 (05/23 0530) BP: (90-123)/(55-75) 104/62 mmHg (05/23 0833) SpO2:  [93 %-100 %] 93 % (05/23 0530) Weight:  [72.53 kg (159 lb 14.4 oz)-73.029 kg (161 lb)] 72.53 kg (159 lb 14.4 oz) (05/23 0530)   General appearance: alert, cooperative and no distress Resp: clear to auscultation bilaterally Cardio: regular rate and rhythm GI: normal findings: bowel sounds normal and soft, non-tender  Lab Results  Recent Labs  11/07/13 0708 11/07/13 2015  11/08/13 1438 11/09/13 0523  WBC 14.6* 9.6  --   --   --   HGB 13.6 12.3*  --   --   --   HCT 40.9 38.2*  --   --   --   NA 143 140  < > 136* 139  K 3.4* 3.3*  < > 3.6* 3.2*  CL 98 95*  < > 93* 97  CO2 29 27  < > 30 29  BUN 87* 69*  < > 69* 71*  CREATININE 2.33* 1.95*  < > 1.97*  2.00*  < > = values in this interval not displayed. Liver Panel  Recent Labs  11/07/13 0708  PROT 7.3  ALBUMIN 3.8  AST 19  ALT 12  ALKPHOS 53  BILITOT 0.8   Sedimentation Rate No results found for this basename: ESRSEDRATE,  in the last 72 hours C-Reactive Protein No results found for this basename: CRP,  in the last 72 hours  Microbiology: Recent Results (from the past 240 hour(s))  CULTURE, BLOOD (ROUTINE X 2)     Status: None   Collection Time    11/07/13  2:40 PM      Result Value Ref Range Status   Specimen Description BLOOD RIGHT ARM   Final   Special Requests BOTTLES DRAWN AEROBIC AND ANAEROBIC 10CC   Final   Culture  Setup Time     Final   Value: 11/07/2013 20:32     Performed at [facility]   Culture     Final   Value: STAPHYLOCOCCUS SPECIES (COAGULASE NEGATIVE)     Note: RIFAMPIN AND GENTAMICIN  SHOULD NOT BE USED AS SINGLE DRUGS FOR TREATMENT OF STAPH INFECTIONS.     Note: Gram Stain Report Called to,Read Back By and Verified With: CHAT D@10 :16AM ON 11/08/13 BY DANTS     Performed at [facility]   Report Status PENDING   Incomplete  CULTURE, BLOOD (ROUTINE X 2)     Status: None   Collection Time    11/07/13  2:55 PM      Result Value Ref Range Status   Specimen Description BLOOD RIGHT ANTECUBITAL   Final   Special Requests BOTTLES DRAWN AEROBIC AND ANAEROBIC 10CC   Final   Culture  Setup Time     Final   Value: 11/07/2013 20:31     Performed at [facility]   Culture     Final   Value: STAPHYLOCOCCUS SPECIES (COAGULASE NEGATIVE)     Note: Gram Stain Report Called to,Read Back By and Verified With: CHAT D@10 :16AM ON 11/08/13 BY DANTS     Performed at [facility]   Report Status PENDING   Incomplete  MRSA PCR SCREENING     Status: None   Collection Time    11/07/13  3:48 PM      Result Value Ref Range Status   MRSA by PCR NEGATIVE  NEGATIVE Final   Comment:            The GeneXpert MRSA Assay (FDA     approved for  NASAL specimens     only), is one component of a     comprehensive MRSA colonization     surveillance program. It is not     intended to diagnose MRSA     infection nor to guide or     monitor treatment for     MRSA infections.  URINE CULTURE     Status: None   Collection Time    11/07/13 11:55 PM      Result Value Ref Range Status   Specimen Description URINE, CLEAN CATCH   Final   Special Requests none Normal   Final   Culture  Setup Time     Final   Value: 11/08/2013 04:18     Performed at [facility]     Final   Value: 25,000 COLONIES/ML     Performed at [facility]   Culture     Final   Value: Multiple bacterial morphotypes present, none predominant. Suggest appropriate recollection if clinically indicated.     Performed at [facility]   Report Status 11/09/2013 FINAL   Final  CLOSTRIDIUM DIFFICILE BY PCR     Status: None   Collection Time    11/08/13 12:03 PM      Result Value Ref Range Status   C difficile by pcr NEGATIVE  NEGATIVE Final    Studies/Results: Ct Abdomen Pelvis Wo Contrast  11/07/2013   CLINICAL DATA:  Abdominal pain with nausea.  EXAM: CT ABDOMEN AND PELVIS WITHOUT CONTRAST  TECHNIQUE: Multidetector CT imaging of the abdomen and pelvis was performed following the standard protocol without IV contrast.  COMPARISON:  None.  FINDINGS: There is cardiomegaly.  Is a tiny hiatal hernia.  There is suggestion of very tiny subtle stones in the gallbladder. There is no dilatation of the biliary tree in the gallbladder wall is not thickened in the gallbladder is not distended. Liver is normal. There are calcified granulomas in the otherwise normal spleen. Pancreas and adrenal glands are normal. There  are multiple diverticula scattered throughout the colon. The bowel is otherwise normal.  There are multiple low-density lesions in the right kidney, probably cysts, including an 11 mm lesion in the mid right kidney, a 3.8 mm lesion in  the lower pole medially and 15 mm lesion laterally in the lower pole. There is a 5 cm cyst in the anterior aspect of the mid left kidney and exophytic 10 mm lesion on the left lower pole.  There is extensive calcification in the abdominal aorta and common iliac arteries. No acute osseous abnormality.  There is a 6.0 x 4.5 x 2.7 cm primarily fatty mass in the retroperitoneum between the upper pole of the right kidney and the upper lumbar spine and proximal soleus muscle. I suspect this represents a liposarcoma. This was not present on the prior CT scan of 03/28/2004.  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  Possible tiny gallstones.  Probable bilateral renal cysts. This could be confirmed with ultrasound.  Diverticulosis.  No acute diverticulitis.   Electronically Signed   By: Rozetta Nunnery M.D.   On: 11/07/2013 11:15     Assessment/Plan: Staph/CNS bacteremia  ? Liposarcoma  Total days of antibiotics 2 (vanco/ancef)  Await Bx of mass Will narrow anbx when sensi done repeat BCx 5-22 pending.  Will need TEE, eval of ICD         [facility] (pager) [phone] [email or web address] 11/09/2013, 10:24 AM  LOS: 2 days   **Disclaimer: This note may have been dictated with voice recognition software. Similar sounding words can inadvertently be transcribed and this note may contain transcription errors which may not have been corrected upon publication of note.**

## 2013-11-09 NOTE — Progress Notes (Signed)
Subjective:  Says that he has a little bit of abdominal pain. No complaints of chest pain or shortness of breath. Notes that he has staph bacteremia and also plans are in place for possible biopsy of a retroperitoneal mass.  Objective:  Vital Signs in the last 24 hours: BP 104/62  Pulse 87  Temp(Src) 98.6 F (37 C) (Oral)  Resp 17  Ht 5\' 7"  (1.702 m)  Wt 72.53 kg (159 lb 14.4 oz)  BMI 25.04 kg/m2  SpO2 93%  Physical Exam: Pleasant thin Hispanic-appearing male in no acute distress Lungs:  Clear  Cardiac:  Irregular rhythm, normal S1 and S2, no S3 Abdomen:  Very mild abdominal tenderness  Extremities:  No edema present  Intake/Output from previous day: 05/22 0701 - 05/23 0700 In: 899.2 [P.O.:540; I.V.:259.2; IV Piggyback:100] Out: 1035 [Urine:1035] Weight Filed Weights   11/08/13 0500 11/08/13 1938 11/09/13 0530  Weight: 72.9 kg (160 lb 11.5 oz) 73.029 kg (161 lb) 72.53 kg (159 lb 14.4 oz)    Lab Results: Basic Metabolic Panel:  Recent Labs  11/08/13 1438 11/09/13 0523  NA 136* 139  K 3.6* 3.2*  CL 93* 97  CO2 30 29  GLUCOSE 119* 134*  BUN 69* 71*  CREATININE 1.97* 2.00*    CBC:  Recent Labs  11/07/13 0708 11/07/13 2015  WBC 14.6* 9.6  NEUTROABS 13.8*  --   HGB 13.6 12.3*  HCT 40.9 38.2*  MCV 93.2 93.4  PLT 199 157    BNP    Component Value Date/Time   PROBNP 9356.0* 11/07/2013 0708    PROTIME: Lab Results  Component Value Date   INR 2.31* 11/09/2013   INR 2.86* 11/08/2013   INR 2.43* 11/07/2013    Telemetry: Paced rhythm  Assessment/Plan:  1. Chronic systolic heart failure on home milrinone 2. Pulmonary hypertension 3. Staph sepsis 4. Indwelling implantable defibrillator and previous PICC line 5. Atrial fibrillation 6. Chronic anticoagulation 7. 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 Chronic kidney disease  Recommendations:  He has been taken off of warfarin but INR is elevated again today. Volume status appears reasonable at  this time. He'll need to have a TEE for the staph sepsis to rule out an infection of his defibrillator lead. His PICC line has been removed at the present time. He continues on milrinone. Biopsy of the right peritoneal mass when his INR is normalized.     Kerry Hough  MD [facility]  11/09/2013, 9:43 AM

## 2013-11-09 NOTE — Progress Notes (Signed)
ANTICOAGULATION CONSULT NOTE - Initial Consult  Pharmacy Consult for Heparin Indication: atrial fibrillation  Allergies  Allergen Reactions  . Lisinopril Rash    Patient Measurements: Height: 5\' 7"  (170.2 cm) Weight: 159 lb 14.4 oz (72.53 kg) (a scale) IBW/kg (Calculated) : 66.1 Heparin Dosing Weight: 72.5 kg  Vital Signs: Temp: 97.5 F (36.4 C) (05/23 1300) Temp src: Oral (05/23 1300) BP: 105/62 mmHg (05/23 1300) Pulse Rate: 96 (05/23 1300)  Labs:  Recent Labs  11/07/13 0708 11/07/13 2015 11/08/13 0500 11/08/13 1125 11/08/13 1438 11/09/13 0523 11/09/13 1840  HGB 13.6 12.3*  --   --   --   --   --   HCT 40.9 38.2*  --   --   --   --   --   PLT 199 157  --   --   --   --   --   LABPROT 22.1* 25.6*  --  29.0*  --  24.6* 20.4*  INR 2.00* 2.43*  --  2.86*  --  2.31* 1.80*  CREATININE 2.33* 1.95* 1.91*  --  1.97* 2.00*  --     Estimated Creatinine Clearance: 28 ml/min (by C-G formula based on Cr of 2).   Medical History: Past Medical History  Diagnosis Date  . HTN (hypertension)   . High cholesterol   . CHF (congestive heart failure)   . BBBB (bilateral bundle branch block)   . LBBB (left bundle branch block)   . Weakness   . Tired   . Seizures 06/11/2012    new onset/notes (06/11/2012)  . SOB (shortness of breath)     "sometimes when I lay down;; related to not taking my RX" (06/11/2012)  . Myocardial infarction     06/10/2012  . Atrial fibrillation   . Stroke   . Atrial thrombus     left  . Pulmonary HTN    Assessment: 80 y/o M on chronic anticoagulation (Coumadin) for afib and h/o CVA. INR down to 1.8. Plan to start IV heparin while off Coumadin for probable bx for liposarcoma.  Goal of Therapy:  Heparin level 0.3-0.7 units/ml Monitor platelets by anticoagulation protocol: Yes   Plan:  Start IV heparin (no bolus with INR 1.8) at 1000 units/hr. Daily heparin level and CBC  Loetta Connelley S. Alford Highland, PharmD, [facility] Clinical Staff Pharmacist Pager  [phone]  Wayland Salinas 11/09/2013,7:58 PM

## 2013-11-09 NOTE — Plan of Care (Signed)
Problem: Phase II Progression Outcomes Goal: Dyspnea controlled with activity Minimal shortness of breath with exertion.  Alert and cooperative.

## 2013-11-10 DIAGNOSIS — N179 Acute kidney failure, unspecified: Secondary | ICD-10-CM

## 2013-11-10 DIAGNOSIS — N189 Chronic kidney disease, unspecified: Secondary | ICD-10-CM

## 2013-11-10 LAB — PROTIME-INR
INR: 1.61 — ABNORMAL HIGH (ref 0.00–1.49)
PROTHROMBIN TIME: 18.7 s — AB (ref 11.6–15.2)

## 2013-11-10 LAB — HEPARIN LEVEL (UNFRACTIONATED)
Heparin Unfractionated: <0.1 IU/mL — ABNORMAL LOW (ref 0.30–0.70)
Heparin Unfractionated: 0.52 IU/mL (ref 0.30–0.70)

## 2013-11-10 LAB — CULTURE, BLOOD (ROUTINE X 2)

## 2013-11-10 LAB — BASIC METABOLIC PANEL
BUN: 76 mg/dL — AB (ref 6–23)
CHLORIDE: 93 meq/L — AB (ref 96–112)
CO2: 30 mEq/L (ref 19–32)
CREATININE: 2.05 mg/dL — AB (ref 0.50–1.35)
Calcium: 8.7 mg/dL (ref 8.4–10.5)
GFR, EST AFRICAN AMERICAN: 34 mL/min — AB (ref >90)
GFR, EST NON AFRICAN AMERICAN: 29 mL/min — AB (ref >90)
Glucose, Bld: 120 mg/dL — ABNORMAL HIGH (ref 70–99)
Potassium: 3.1 mEq/L — ABNORMAL LOW (ref 3.7–5.3)
Sodium: 137 mEq/L (ref 137–147)

## 2013-11-10 LAB — CBC
HEMATOCRIT: 36.3 % — AB (ref 39.0–52.0)
Hemoglobin: 12.4 g/dL — ABNORMAL LOW (ref 13.0–17.0)
MCH: 31.4 pg (ref 26.0–34.0)
MCHC: 34.2 g/dL (ref 30.0–36.0)
MCV: 91.9 fL (ref 78.0–100.0)
PLATELETS: 157 10*3/uL (ref 150–400)
RBC: 3.95 MIL/uL — ABNORMAL LOW (ref 4.22–5.81)
RDW: 14.8 % (ref 11.5–15.5)
WBC: 8.9 10*3/uL (ref 4.0–10.5)

## 2013-11-10 NOTE — Progress Notes (Signed)
INFECTIOUS DISEASE PROGRESS NOTE  ID: Tyler Christensen is a 80 y.o. male with  Active Problems:   Acute on chronic systolic heart failure  Subjective: Without complaints  Abtx:  Anti-infectives   Start     Dose/Rate Route Frequency Ordered Stop   11/09/13 1100  vancomycin (VANCOCIN) IVPB 750 mg/150 ml premix     750 mg 150 mL/hr over 60 Minutes Intravenous Every 24 hours 11/08/13 1034     11/08/13 1200  ceFAZolin (ANCEF) IVPB 1 g/50 mL premix     1 g 100 mL/hr over 30 Minutes Intravenous Every 12 hours 11/08/13 1103     11/08/13 1100  vancomycin (VANCOCIN) IVPB 1000 mg/200 mL premix     1,000 mg 200 mL/hr over 60 Minutes Intravenous  Once 11/08/13 1034 11/08/13 1259      Medications:  Scheduled: . carvedilol  3.125 mg Oral BID WC  .  ceFAZolin (ANCEF) IV  1 g Intravenous Q12H  . furosemide  80 mg Intravenous BID  . potassium chloride  40 mEq Oral BID  . simvastatin  40 mg Oral q1800  . Tadalafil (PAH)  40 mg Oral Daily  . vancomycin  750 mg Intravenous Q24H    Objective: Vital signs in last 24 hours: Temp:  [97.4 F (36.3 C)-98.5 F (36.9 C)] 98.5 F (36.9 C) (05/24 0542) Pulse Rate:  [86-106] 86 (05/24 0542) Resp:  [19-20] 19 (05/24 0542) BP: (86-123)/(46-68) 86/46 mmHg (05/24 0542) SpO2:  [94 %-100 %] 97 % (05/24 0542) Weight:  [72.213 kg (159 lb 3.2 oz)] 72.213 kg (159 lb 3.2 oz) (05/24 0439)   General appearance: alert, cooperative and no distress  Lab Results  Recent Labs  11/07/13 2015  11/09/13 0523 11/10/13 0307  WBC 9.6  --   --  8.9  HGB 12.3*  --   --  12.4*  HCT 38.2*  --   --  36.3*  NA 140  < > 139 137  K 3.3*  < > 3.2* 3.1*  CL 95*  < > 97 93*  CO2 27  < > 29 30  BUN 69*  < > 71* 76*  CREATININE 1.95*  < > 2.00* 2.05*  < > = values in this interval not displayed. Liver Panel No results found for this basename: PROT, ALBUMIN, AST, ALT, ALKPHOS, BILITOT, BILIDIR, IBILI,  in the last 72 hours Sedimentation Rate No results found for this  basename: ESRSEDRATE,  in the last 72 hours C-Reactive Protein No results found for this basename: CRP,  in the last 72 hours  Microbiology: Recent Results (from the past 240 hour(s))  CULTURE, BLOOD (ROUTINE X 2)     Status: None   Collection Time    11/07/13  2:40 PM      Result Value Ref Range Status   Specimen Description BLOOD RIGHT ARM   Final   Special Requests BOTTLES DRAWN AEROBIC AND ANAEROBIC 10CC   Final   Culture  Setup Time     Final   Value: 11/07/2013 20:32     Performed at [facility]   Culture     Final   Value: STAPHYLOCOCCUS SPECIES (COAGULASE NEGATIVE)     Note: RIFAMPIN AND GENTAMICIN SHOULD NOT BE USED AS SINGLE DRUGS FOR TREATMENT OF STAPH INFECTIONS.     Note: Gram Stain Report Called to,Read Back By and Verified With: CHAT D@10 :16AM ON 11/08/13 BY DANTS     Performed at [facility]   Report Status PENDING  Incomplete  CULTURE, BLOOD (ROUTINE X 2)     Status: None   Collection Time    11/07/13  2:55 PM      Result Value Ref Range Status   Specimen Description BLOOD RIGHT ANTECUBITAL   Final   Special Requests BOTTLES DRAWN AEROBIC AND ANAEROBIC 10CC   Final   Culture  Setup Time     Final   Value: 11/07/2013 20:31     Performed at [facility]   Culture     Final   Value: STAPHYLOCOCCUS SPECIES (COAGULASE NEGATIVE)     Note: Gram Stain Report Called to,Read Back By and Verified With: CHAT D@10 :16AM ON 11/08/13 BY DANTS     Performed at [facility]   Report Status PENDING   Incomplete  MRSA PCR SCREENING     Status: None   Collection Time    11/07/13  3:48 PM      Result Value Ref Range Status   MRSA by PCR NEGATIVE  NEGATIVE Final   Comment:            The GeneXpert MRSA Assay (FDA     approved for NASAL specimens     only), is one component of a     comprehensive MRSA colonization     surveillance program. It is not     intended to diagnose MRSA     infection nor to guide or     monitor treatment for     MRSA  infections.  URINE CULTURE     Status: None   Collection Time    11/07/13 11:55 PM      Result Value Ref Range Status   Specimen Description URINE, CLEAN CATCH   Final   Special Requests none Normal   Final   Culture  Setup Time     Final   Value: 11/08/2013 04:18     Performed at [facility]     Final   Value: 25,000 COLONIES/ML     Performed at [facility]   Culture     Final   Value: Multiple bacterial morphotypes present, none predominant. Suggest appropriate recollection if clinically indicated.     Performed at [facility]   Report Status 11/09/2013 FINAL   Final  CLOSTRIDIUM DIFFICILE BY PCR     Status: None   Collection Time    11/08/13 12:03 PM      Result Value Ref Range Status   C difficile by pcr NEGATIVE  NEGATIVE Final  CULTURE, BLOOD (ROUTINE X 2)     Status: None   Collection Time    11/08/13  4:55 PM      Result Value Ref Range Status   Specimen Description BLOOD RIGHT HAND   Final   Special Requests BOTTLES DRAWN AEROBIC ONLY 5CC   Final   Culture  Setup Time     Final   Value: 11/08/2013 20:38     Performed at [facility]   Culture     Final   Value:        BLOOD CULTURE RECEIVED NO GROWTH TO DATE CULTURE WILL BE HELD FOR 5 DAYS BEFORE ISSUING A FINAL NEGATIVE REPORT     Performed at [facility]   Report Status PENDING   Incomplete    Studies/Results: No results found.   Assessment/Plan: Staph/CNS bacteremia  ICD ? Liposarcoma ARF on CRF  Total days of antibiotics 3 (vanco/ancef)  Repeat BCx 5-22 are ngtd Still awaiting sensi on BCx 5-21 Await TEE, w/u of mass    [facility] (pager) [phone] [email or web address] 11/10/2013, 8:28 AM  LOS: 3 days   **Disclaimer: This note may have been dictated with voice recognition software. Similar sounding words can inadvertently be transcribed and this note may contain transcription errors which may not have  been corrected upon publication of note.**

## 2013-11-10 NOTE — Progress Notes (Signed)
ANTICOAGULATION CONSULT NOTE - Initial Consult  Pharmacy Consult for Heparin Indication: atrial fibrillation  Allergies  Allergen Reactions  . Lisinopril Rash   Patient Measurements: Height: 5\' 7"  (170.2 cm) Weight: 159 lb 3.2 oz (72.213 kg) (scale a) IBW/kg (Calculated) : 66.1 Heparin Dosing Weight: 72.5 kg  Vital Signs: Temp: 98.5 F (36.9 C) (05/24 0542) Temp src: Oral (05/24 0542) BP: 86/46 mmHg (05/24 0542) Pulse Rate: 86 (05/24 0542)  Labs:  Recent Labs  11/07/13 0708 11/07/13 2015  11/08/13 1438 11/09/13 0523 11/09/13 1840 11/10/13 0307  HGB 13.6 12.3*  --   --   --   --  12.4*  HCT 40.9 38.2*  --   --   --   --  36.3*  PLT 199 157  --   --   --   --  157  LABPROT 22.1* 25.6*  < >  --  24.6* 20.4* 18.7*  INR 2.00* 2.43*  < >  --  2.31* 1.80* 1.61*  HEPARINUNFRC  --   --   --   --   --   --  <0.10*  CREATININE 2.33* 1.95*  < > 1.97* 2.00*  --  2.05*  < > = values in this interval not displayed.  Estimated Creatinine Clearance: 27.3 ml/min (by C-G formula based on Cr of 2.05).  Medical History: Past Medical History  Diagnosis Date  . HTN (hypertension)   . High cholesterol   . CHF (congestive heart failure)   . BBBB (bilateral bundle branch block)   . LBBB (left bundle branch block)   . Weakness   . Tired   . Seizures 06/11/2012    new onset/notes (06/11/2012)  . SOB (shortness of breath)     "sometimes when I lay down;; related to not taking my RX" (06/11/2012)  . Myocardial infarction     06/10/2012  . Atrial fibrillation   . Stroke   . Atrial thrombus     left  . Pulmonary HTN    Assessment: 80 y/o M on chronic anticoagulation (Coumadin) for afib and h/o CVA. INR down to 1.8. IV heparin started for bridge therapy while off Coumadin for probable bx for liposarcoma.  His initial level is < 0.1IU/ml.  No noted bleeding complications and he has a stable CBC and platelet count this morning.  Goal of Therapy:  Heparin level 0.3-0.7  units/ml Monitor platelets by anticoagulation protocol: Yes   Plan:  - Increase IV heparin rate to 1200 units/hr - Check heparin level in 8 hours and adjust - Daily heparin level and CBC  Rober Minion, PharmD., MS Clinical Pharmacist Pager:  [phone] Thank you for allowing pharmacy to be part of this patients care team. 11/10/2013,6:19 AM

## 2013-11-10 NOTE — Progress Notes (Signed)
Subjective:  No current complaints of shortness of breath or chest pain. Tolerating milrinone well. Awaiting normalization of INR so that he can have a retroperitoneal biopsy. He has a slight infiltration of one of his IV sites.  Objective:  Vital Signs in the last 24 hours: BP 98/58  Pulse 82  Temp(Src) 98.4 F (36.9 C) (Oral)  Resp 20  Ht 5\' 7"  (1.702 m)  Wt 72.213 kg (159 lb 3.2 oz)  BMI 24.93 kg/m2  SpO2 98%  Physical Exam: Pleasant thin Hispanic-appearing male in no acute distress Lungs:  Clear  Cardiac:  Irregular rhythm, normal S1 and S2, no S3 Abdomen:  Very mild abdominal tenderness  Extremities:  No edema present  Intake/Output from previous day: 05/23 0701 - 05/24 0700 In: 1202.5 [P.O.:720; I.V.:232.5; IV Piggyback:250] Out: 900 [Urine:900] Weight Filed Weights   11/08/13 1938 11/09/13 0530 11/10/13 0439  Weight: 73.029 kg (161 lb) 72.53 kg (159 lb 14.4 oz) 72.213 kg (159 lb 3.2 oz)    Lab Results: Basic Metabolic Panel:  Recent Labs  11/09/13 0523 11/10/13 0307  NA 139 137  K 3.2* 3.1*  CL 97 93*  CO2 29 30  GLUCOSE 134* 120*  BUN 71* 76*  CREATININE 2.00* 2.05*    CBC:  Recent Labs  11/07/13 2015 11/10/13 0307  WBC 9.6 8.9  HGB 12.3* 12.4*  HCT 38.2* 36.3*  MCV 93.4 91.9  PLT 157 157    BNP    Component Value Date/Time   PROBNP 9356.0* 11/07/2013 0708    PROTIME: Lab Results  Component Value Date   INR 1.61* 11/10/2013   INR 1.80* 11/09/2013   INR 2.31* 11/09/2013    Telemetry: Paced rhythm  Assessment/Plan:  1. Chronic systolic heart failure on home milrinone 2. Pulmonary hypertension 3. Staph sepsis 4. Indwelling implantable defibrillator and previous PICC line 5. Atrial fibrillation 6. Chronic anticoagulation 7. 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 Chronic kidney disease  Recommendations:   He will need to have a TEE arranged because of the staph sepsis. Awaiting normalization of INR for biopsy. May need to  have PICC line reinserted soon.     Kerry Hough  MD [facility]  11/10/2013, 11:13 AM

## 2013-11-10 NOTE — Progress Notes (Signed)
ANTICOAGULATION CONSULT NOTE - Initial Consult  Pharmacy Consult for Heparin Indication: atrial fibrillation  Allergies  Allergen Reactions  . Lisinopril Rash    Patient Measurements: Height: 5\' 7"  (170.2 cm) Weight: 159 lb 3.2 oz (72.213 kg) (scale a) IBW/kg (Calculated) : 66.1 Heparin Dosing Weight: 72.5 kg  Vital Signs: Temp: 97.4 F (36.3 C) (05/24 1400) Temp src: Oral (05/24 1400) BP: 109/61 mmHg (05/24 1400) Pulse Rate: 88 (05/24 1400)  Labs:  Recent Labs  11/07/13 2015  11/08/13 1438 11/09/13 0523 11/09/13 1840 11/10/13 0307 11/10/13 1646  HGB 12.3*  --   --   --   --  12.4*  --   HCT 38.2*  --   --   --   --  36.3*  --   PLT 157  --   --   --   --  157  --   LABPROT 25.6*  < >  --  24.6* 20.4* 18.7*  --   INR 2.43*  < >  --  2.31* 1.80* 1.61*  --   HEPARINUNFRC  --   --   --   --   --  <0.10* 0.52  CREATININE 1.95*  < > 1.97* 2.00*  --  2.05*  --   < > = values in this interval not displayed.  Estimated Creatinine Clearance: 27.3 ml/min (by C-G formula based on Cr of 2.05).   Medical History: Past Medical History  Diagnosis Date  . HTN (hypertension)   . High cholesterol   . CHF (congestive heart failure)   . BBBB (bilateral bundle branch block)   . LBBB (left bundle branch block)   . Weakness   . Tired   . Seizures 06/11/2012    new onset/notes (06/11/2012)  . SOB (shortness of breath)     "sometimes when I lay down;; related to not taking my RX" (06/11/2012)  . Myocardial infarction     06/10/2012  . Atrial fibrillation   . Stroke   . Atrial thrombus     left  . Pulmonary HTN    Assessment: 80 y/o M on chronic anticoagulation (Coumadin) for afib and h/o CVA. INR down to 1.61. Plan to start IV heparin while off Coumadin for probable bx for liposarcoma. Heparin level 0.52 in goal.  Goal of Therapy:  Heparin level 0.3-0.7 units/ml Monitor platelets by anticoagulation protocol: Yes   Plan:  Continue heparin  at 1200 units/hr. Daily  heparin level and CBC  Renesme Kerrigan S. Alford Highland, PharmD, [facility] Clinical Staff Pharmacist Pager [phone]  Wayland Salinas 11/10/2013,5:31 PM

## 2013-11-10 NOTE — Plan of Care (Signed)
Problem: Phase I Progression Outcomes Goal: Dyspnea controlled at rest (HF) Outcome: Progressing No shortness of breath noted.  Tolerating IV medication without difficulty.  Oxygen saturation 94-96% on room air.

## 2013-11-11 DIAGNOSIS — I5022 Chronic systolic (congestive) heart failure: Secondary | ICD-10-CM

## 2013-11-11 LAB — PROTIME-INR
INR: 1.33 (ref 0.00–1.49)
Prothrombin Time: 16.2 seconds — ABNORMAL HIGH (ref 11.6–15.2)

## 2013-11-11 LAB — HEPARIN LEVEL (UNFRACTIONATED)
Heparin Unfractionated: 0.24 IU/mL — ABNORMAL LOW (ref 0.30–0.70)
Heparin Unfractionated: 0.51 IU/mL (ref 0.30–0.70)

## 2013-11-11 LAB — CBC
HCT: 36.9 % — ABNORMAL LOW (ref 39.0–52.0)
HEMOGLOBIN: 12.5 g/dL — AB (ref 13.0–17.0)
MCH: 31 pg (ref 26.0–34.0)
MCHC: 33.9 g/dL (ref 30.0–36.0)
MCV: 91.6 fL (ref 78.0–100.0)
Platelets: 176 10*3/uL (ref 150–400)
RBC: 4.03 MIL/uL — AB (ref 4.22–5.81)
RDW: 14.6 % (ref 11.5–15.5)
WBC: 8.3 10*3/uL (ref 4.0–10.5)

## 2013-11-11 MED ORDER — HEPARIN (PORCINE) IN NACL 100-0.45 UNIT/ML-% IJ SOLN
1350.0000 [IU]/h | INTRAMUSCULAR | Status: DC
  Administered 2013-11-11: 1350 [IU]/h via INTRAVENOUS
  Filled 2013-11-11 (×3): qty 250

## 2013-11-11 MED ORDER — SODIUM CHLORIDE 0.9 % IV SOLN
INTRAVENOUS | Status: DC
  Administered 2013-11-11: via INTRAVENOUS

## 2013-11-11 MED ORDER — POTASSIUM CHLORIDE ER 10 MEQ PO TBCR
40.0000 meq | EXTENDED_RELEASE_TABLET | Freq: Once | ORAL | Status: AC
  Filled 2013-11-11: qty 4

## 2013-11-11 NOTE — Progress Notes (Signed)
ANTIBIOTIC CONSULT NOTE - FOLLOW UP  Pharmacy Consult for Cefazolin Indication: Bacteremia  Allergies  Allergen Reactions  . Lisinopril Rash    Patient Measurements: Height: 5\' 7"  (170.2 cm) Weight: 159 lb (72.122 kg) (a scale) IBW/kg (Calculated) : 66.1   Vital Signs: Temp: 98 F (36.7 C) (05/25 0611) Temp src: Oral (05/25 0611) BP: 98/62 mmHg (05/25 0706) Pulse Rate: 91 (05/25 0611) Intake/Output from previous day: 05/24 0701 - 05/25 0700 In: 6295 [P.O.:800; I.V.:140; IV Piggyback:150] Out: 1800 [Urine:1800] Intake/Output from this shift:    Labs:  Recent Labs  11/08/13 1438 11/09/13 0523 11/10/13 0307 11/11/13 0506  WBC  --   --  8.9 8.3  HGB  --   --  12.4* 12.5*  PLT  --   --  157 176  CREATININE 1.97* 2.00* 2.05*  --    Estimated Creatinine Clearance: 27.3 ml/min (by C-G formula based on Cr of 2.05). No results found for this basename: VANCOTROUGH, Corlis Leak, VANCORANDOM, [place], GENTPEAK, GENTRANDOM, TOBRATROUGH, TOBRAPEAK, TOBRARND, AMIKACINPEAK, AMIKACINTROU, AMIKACIN,  in the last 72 hours   Microbiology: Recent Results (from the past 720 hour(s))  CULTURE, BLOOD (ROUTINE X 2)     Status: None   Collection Time    11/07/13  2:40 PM      Result Value Ref Range Status   Specimen Description BLOOD RIGHT ARM   Final   Special Requests BOTTLES DRAWN AEROBIC AND ANAEROBIC 10CC   Final   Culture  Setup Time     Final   Value: 11/07/2013 20:32     Performed at [facility]   Culture     Final   Value: STAPHYLOCOCCUS SPECIES (COAGULASE NEGATIVE)     Note: RIFAMPIN AND GENTAMICIN SHOULD NOT BE USED AS SINGLE DRUGS FOR TREATMENT OF STAPH INFECTIONS.     Note: Gram Stain Report Called to,Read Back By and Verified With: CHAT D@10 :16AM ON 11/08/13 BY DANTS     Performed at [facility]   Report Status 11/10/2013 FINAL   Final   Organism ID, Bacteria STAPHYLOCOCCUS SPECIES (COAGULASE NEGATIVE)   Final  CULTURE, BLOOD (ROUTINE X 2)      Status: None   Collection Time    11/07/13  2:55 PM      Result Value Ref Range Status   Specimen Description BLOOD RIGHT ANTECUBITAL   Final   Special Requests BOTTLES DRAWN AEROBIC AND ANAEROBIC 10CC   Final   Culture  Setup Time     Final   Value: 11/07/2013 20:31     Performed at [facility]   Culture     Final   Value: STAPHYLOCOCCUS SPECIES (COAGULASE NEGATIVE)     Note: SUSCEPTIBILITIES PERFORMED ON PREVIOUS CULTURE WITHIN THE LAST 5 DAYS.     Note: Gram Stain Report Called to,Read Back By and Verified With: CHAT D@10 :16AM ON 11/08/13 BY DANTS     Performed at [facility]   Report Status 11/10/2013 FINAL   Final  MRSA PCR SCREENING     Status: None   Collection Time    11/07/13  3:48 PM      Result Value Ref Range Status   MRSA by PCR NEGATIVE  NEGATIVE Final   Comment:            The GeneXpert MRSA Assay (FDA     approved for NASAL specimens     only), is one component of a     comprehensive MRSA colonization  surveillance program. It is not     intended to diagnose MRSA     infection nor to guide or     monitor treatment for     MRSA infections.  URINE CULTURE     Status: None   Collection Time    11/07/13 11:55 PM      Result Value Ref Range Status   Specimen Description URINE, CLEAN CATCH   Final   Special Requests none Normal   Final   Culture  Setup Time     Final   Value: 11/08/2013 04:18     Performed at [facility]     Final   Value: 25,000 COLONIES/ML     Performed at [facility]   Culture     Final   Value: Multiple bacterial morphotypes present, none predominant. Suggest appropriate recollection if clinically indicated.     Performed at [facility]   Report Status 11/09/2013 FINAL   Final  CLOSTRIDIUM DIFFICILE BY PCR     Status: None   Collection Time    11/08/13 12:03 PM      Result Value Ref Range Status   C difficile by pcr NEGATIVE  NEGATIVE Final  CULTURE, BLOOD (ROUTINE X 2)      Status: None   Collection Time    11/08/13  4:55 PM      Result Value Ref Range Status   Specimen Description BLOOD RIGHT HAND   Final   Special Requests BOTTLES DRAWN AEROBIC ONLY 5CC   Final   Culture  Setup Time     Final   Value: 11/08/2013 20:38     Performed at [facility]   Culture     Final   Value:        BLOOD CULTURE RECEIVED NO GROWTH TO DATE CULTURE WILL BE HELD FOR 5 DAYS BEFORE ISSUING A FINAL NEGATIVE REPORT     Performed at [facility]   Report Status PENDING   Incomplete    Anti-infectives   Start     Dose/Rate Route Frequency Ordered Stop   11/09/13 1100  vancomycin (VANCOCIN) IVPB 750 mg/150 ml premix  Status:  Discontinued     750 mg 150 mL/hr over 60 Minutes Intravenous Every 24 hours 11/08/13 1034 11/10/13 1443   11/08/13 1200  ceFAZolin (ANCEF) IVPB 1 g/50 mL premix     1 g 100 mL/hr over 30 Minutes Intravenous Every 12 hours 11/08/13 1103     11/08/13 1100  vancomycin (VANCOCIN) IVPB 1000 mg/200 mL premix     1,000 mg 200 mL/hr over 60 Minutes Intravenous  Once 11/08/13 1034 11/08/13 1259      Assessment: 80 y/o male with staph bacteremia. Pharmacy has been consulted to dose cefazolin. Renal function has been stable. Afebrile, WBC normal.   5/21 blood x3>> 2/2 gpc in clusters (coag neg staph species) 5/21 urine>>  5/22 Vanc>>5/24 5/22 Cefazolin>>  Goal of Therapy:  Eradication of infection  Plan:  - Continue cefazolin 1g IV q12h - Monitor renal function, cultures, WBC, and Tmax - Follow-up TEE  Iszabella Hebenstreit A. Pincus Badder, PharmD Clinical Pharmacist - Resident Pager: [phone] Pharmacy: [phone] 11/11/2013 7:35 AM

## 2013-11-11 NOTE — Progress Notes (Addendum)
ANTICOAGULATION CONSULT NOTE - [place] for Heparin Indication: atrial fibrillation  Allergies  Allergen Reactions  . Lisinopril Rash    Patient Measurements: Height: 5\' 7"  (170.2 cm) Weight: 159 lb (72.122 kg) (a scale) IBW/kg (Calculated) : 66.1 Heparin Dosing Weight: 72.5 kg  Vital Signs: Temp: 98 F (36.7 C) (05/25 0611) Temp src: Oral (05/25 0611) BP: 98/62 mmHg (05/25 0706) Pulse Rate: 91 (05/25 0611)  Labs:  Recent Labs  11/08/13 1438 11/09/13 0523 11/09/13 1840 11/10/13 0307 11/10/13 1646 11/11/13 0506  HGB  --   --   --  12.4*  --  12.5*  HCT  --   --   --  36.3*  --  36.9*  PLT  --   --   --  157  --  176  LABPROT  --  24.6* 20.4* 18.7*  --  16.2*  INR  --  2.31* 1.80* 1.61*  --  1.33  HEPARINUNFRC  --   --   --  <0.10* 0.52 0.24*  CREATININE 1.97* 2.00*  --  2.05*  --   --     Estimated Creatinine Clearance: 27.3 ml/min (by C-G formula based on Cr of 2.05).   Medical History: Past Medical History  Diagnosis Date  . HTN (hypertension)   . High cholesterol   . CHF (congestive heart failure)   . BBBB (bilateral bundle branch block)   . LBBB (left bundle branch block)   . Weakness   . Tired   . Seizures 06/11/2012    new onset/notes (06/11/2012)  . SOB (shortness of breath)     "sometimes when I lay down;; related to not taking my RX" (06/11/2012)  . Myocardial infarction     06/10/2012  . Atrial fibrillation   . Stroke   . Atrial thrombus     left  . Pulmonary HTN    Assessment: 80 y/o M on chronic anticoagulation (Coumadin) for afib and h/o CVA. INR down to 1.33. Plan to continue IV heparin while off Coumadin for probable bx for liposarcoma. Heparin level subtherapeutic this AM at 0.24. Nurse reported no problems with IV heparin. Hg 12.5, plt 176. No s/s of bleeding noted.   Goal of Therapy:  Heparin level 0.3-0.7 units/ml Monitor platelets by anticoagulation protocol: Yes   Plan:  - Increase IV heparin to  1350units/hr - Obtain heparin level at 1600 - Monitor daily CBC, heparin level, INR, and s/s of bleeding  Roderic Palau A. Pincus Badder, PharmD Clinical Pharmacist - Resident Pager: [phone] Pharmacy: [phone] 11/11/2013 7:29 AM   Addendum Heparin level therapeutic, continue 1350 units/hr Next heparin level with AM labs   Hughes Better, PharmD, BCPS Clinical Pharmacist Pager: [phone] 11/11/2013 7:28 PM

## 2013-11-11 NOTE — Progress Notes (Signed)
Subjective:   Doing well. Feels ab distension is improving. No fevers/chills. No dyspnea. INR 1.3. On heparin now. Weigh stable.  Objective:  Vital Signs in the last 24 hours: BP 112/59  Pulse 96  Temp(Src) 98 F (36.7 C) (Oral)  Resp 20  Ht 5' 7" (1.702 m)  Wt 72.122 kg (159 lb)  BMI 24.90 kg/m2  SpO2 95%  Physical Exam: Walking around room. no acute distress Lungs:  Clear  Cardiac:  Irregular rhythm, normal S1 and S2, no S3 Abdomen:  Mildly distended. Nontender. Good BS Extremities:  No edema present Warm Neuro: non focal.   Intake/Output from previous day: 05/24 0701 - 05/25 0700 In: 1090 [P.O.:800; I.V.:140; IV Piggyback:150] Out: 1800 [Urine:1800] Weight Filed Weights   11/09/13 0530 11/10/13 0439 11/11/13 0611  Weight: 72.53 kg (159 lb 14.4 oz) 72.213 kg (159 lb 3.2 oz) 72.122 kg (159 lb)    Lab Results: Basic Metabolic Panel:  Recent Labs  11/09/13 0523 11/10/13 0307  NA 139 137  K 3.2* 3.1*  CL 97 93*  CO2 29 30  GLUCOSE 134* 120*  BUN 71* 76*  CREATININE 2.00* 2.05*    CBC:  Recent Labs  11/10/13 0307 11/11/13 0506  WBC 8.9 8.3  HGB 12.4* 12.5*  HCT 36.3* 36.9*  MCV 91.9 91.6  PLT 157 176    BNP    Component Value Date/Time   PROBNP 9356.0* 11/07/2013 0708    PROTIME: Lab Results  Component Value Date   INR 1.33 11/11/2013   INR 1.61* 11/10/2013   INR 1.80* 11/09/2013    Telemetry: Paced rhythm  Assessment/Plan:  1. Chronic systolic heart failure on home milrinone 2. Pulmonary hypertension 3. Staph sepsis 4. Indwelling implantable defibrillator and previous PICC line 5. Atrial fibrillation 6. Chronic anticoagulation 7. 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 Chronic kidney disease 9. Hypokalemia   Recommendations:  INR down. On heparin. Will need to arrange TEE to look at device given staph sepsis. Will also need IR to biopsy RP mass. I will call them today to discuss timing (? Tomorrow or Wed). Will need PICC back  soon. NPO after MN. TEE order written. Supp K+.   Lorelee Mclaurin R Harsh Trulock,MD 10:02 AM   

## 2013-11-12 ENCOUNTER — Encounter (HOSPITAL_COMMUNITY): Payer: Self-pay

## 2013-11-12 ENCOUNTER — Encounter: Payer: Self-pay | Admitting: Internal Medicine

## 2013-11-12 ENCOUNTER — Encounter (HOSPITAL_COMMUNITY)
Admission: EM | Disposition: A | Discharge status: Home / Self Care | Payer: Self-pay | Source: Home / Self Care | Attending: Internal Medicine

## 2013-11-12 ENCOUNTER — Ambulatory Visit (HOSPITAL_COMMUNITY): Admission: RE | Admit: 2013-11-12 | Payer: Medicare Other | Source: Ambulatory Visit | Admitting: Internal Medicine

## 2013-11-12 DIAGNOSIS — B957 Other staphylococcus as the cause of diseases classified elsewhere: Secondary | ICD-10-CM

## 2013-11-12 HISTORY — PX: TEE WITHOUT CARDIOVERSION: SHX5443

## 2013-11-12 LAB — CBC
HCT: 40.4 % (ref 39.0–52.0)
Hemoglobin: 13.6 g/dL (ref 13.0–17.0)
MCH: 31.1 pg (ref 26.0–34.0)
MCHC: 33.7 g/dL (ref 30.0–36.0)
MCV: 92.2 fL (ref 78.0–100.0)
Platelets: 202 10*3/uL (ref 150–400)
RBC: 4.38 MIL/uL (ref 4.22–5.81)
RDW: 14.8 % (ref 11.5–15.5)
WBC: 9.1 10*3/uL (ref 4.0–10.5)

## 2013-11-12 LAB — BASIC METABOLIC PANEL
BUN: 65 mg/dL — ABNORMAL HIGH (ref 6–23)
CALCIUM: 9.1 mg/dL (ref 8.4–10.5)
CO2: 26 mEq/L (ref 19–32)
CREATININE: 1.54 mg/dL — AB (ref 0.50–1.35)
Chloride: 97 mEq/L (ref 96–112)
GFR calc Af Amer: 48 mL/min — ABNORMAL LOW (ref >90)
GFR, EST NON AFRICAN AMERICAN: 41 mL/min — AB (ref >90)
Glucose, Bld: 123 mg/dL — ABNORMAL HIGH (ref 70–99)
Potassium: 3.9 mEq/L (ref 3.7–5.3)
Sodium: 138 mEq/L (ref 137–147)

## 2013-11-12 LAB — HEPARIN LEVEL (UNFRACTIONATED): Heparin Unfractionated: 0.48 IU/mL (ref 0.30–0.70)

## 2013-11-12 LAB — PROTIME-INR
INR: 1.13 (ref 0.00–1.49)
PROTHROMBIN TIME: 14.3 s (ref 11.6–15.2)

## 2013-11-12 SURGERY — RIGHT HEART CATH
Anesthesia: LOCAL

## 2013-11-12 SURGERY — ECHOCARDIOGRAM, TRANSESOPHAGEAL
Anesthesia: Moderate Sedation

## 2013-11-12 MED ORDER — HEPARIN (PORCINE) IN NACL 100-0.45 UNIT/ML-% IJ SOLN
1350.0000 [IU]/h | INTRAMUSCULAR | Status: DC
  Administered 2013-11-12 – 2013-11-13 (×2): 1350 [IU]/h via INTRAVENOUS
  Filled 2013-11-12 (×2): qty 250

## 2013-11-12 MED ORDER — FENTANYL CITRATE 0.05 MG/ML IJ SOLN
INTRAMUSCULAR | Status: DC | PRN
  Administered 2013-11-12: 25 ug via INTRAVENOUS

## 2013-11-12 MED ORDER — BUTAMBEN-TETRACAINE-BENZOCAINE 2-2-14 % EX AERO
INHALATION_SPRAY | CUTANEOUS | Status: DC | PRN
  Administered 2013-11-12: 2 via TOPICAL

## 2013-11-12 MED ORDER — MIDAZOLAM HCL 10 MG/2ML IJ SOLN
INTRAMUSCULAR | Status: DC | PRN
  Administered 2013-11-12: 1 mg via INTRAVENOUS

## 2013-11-12 NOTE — Consult Note (Signed)
Reason for consult: Retroperitoneal mass Referring Physician:Bensimhon HPI: Tyler Christensen is an 80 y.o. male with hx of afib and heart failure. He was admitted for acute on chronic heart failure as well as staph bacteremia. 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 IR is requested to perc biopsy for tissue diagnosis. Pt underwent TEE with sedation this morning. No family in room and pt does not speak [place], Spanish only. He is on heparin gtt while he is off his usual Coumadin  Past Medical History:  Past Medical History  Diagnosis Date  . HTN (hypertension)   . High cholesterol   . CHF (congestive heart failure)   . BBBB (bilateral bundle branch block)   . LBBB (left bundle branch block)   . Weakness   . Tired   . Seizures 06/11/2012    new onset/notes (06/11/2012)  . SOB (shortness of breath)     "sometimes when I lay down;; related to not taking my RX" (06/11/2012)  . Myocardial infarction     06/10/2012  . Atrial fibrillation   . Stroke   . Atrial thrombus     left  . Pulmonary HTN     Past Surgical History:  Past Surgical History  Procedure Laterality Date  . Throat surgery  1942    "and nose" (06/11/2012); ?T&A  . Inguinal hernia repair  ~ 2008    "both sides" (06/11/2012)  . Tee without cardioversion  06/29/2012    Procedure: TRANSESOPHAGEAL ECHOCARDIOGRAM (TEE);  Surgeon: Jolaine Artist, MD;  Location: [facility] ENDOSCOPY;  Service: Cardiovascular;  Laterality: N/A;  will need a spanish interpreter Interpreter will be here at 1330-Hope spoke w/ [place]  . Cardioversion N/A 08/14/2012    Procedure: CARDIOVERSION;  Surgeon: Jolaine Artist, MD;  Location: [facility] ENDOSCOPY;  Service: Cardiovascular;  Laterality: N/A;    Family History: History reviewed. No pertinent family history.  Social History:  reports that he has never smoked. He has never used smokeless tobacco. He reports that he does not drink alcohol or use  illicit drugs.  Allergies:  Allergies  Allergen Reactions  . Lisinopril Rash    Medications: Current facility-administered medications:0.9 %  sodium chloride infusion, , Intravenous, Continuous, Jolaine Artist, MD, Last Rate: 20 mL/hr at 11/11/13 2332;  acetaminophen (TYLENOL) tablet 650 mg, 650 mg, Oral, Q4H PRN, [name] D Clegg, NP;  carvedilol (COREG) tablet 3.125 mg, 3.125 mg, Oral, BID WC, [name] D Clegg, NP, 3.125 mg at 11/12/13 1937 ceFAZolin (ANCEF) IVPB 1 g/50 mL premix, 1 g, Intravenous, Q12H, Jolaine Artist, MD, 1 g at 11/11/13 2331;  furosemide (LASIX) injection 80 mg, 80 mg, Intravenous, BID, Jolaine Artist, MD, 80 mg at 11/11/13 1717;  heparin ADULT infusion 100 units/mL (25000 units/250 mL), 1,350 Units/hr, Intravenous, Continuous, Jolaine Artist, MD milrinone [facility]) infusion 200 mcg/mL (0.2 mg/ml), 0.25 mcg/kg/min, Intravenous, Continuous, [name] D Clegg, NP, Last Rate: 5.8 mL/hr at 11/11/13 2336, 0.25 mcg/kg/min at 11/11/13 2336;  ondansetron (ZOFRAN) injection 4 mg, 4 mg, Intravenous, Q6H PRN, [name] D Clegg, NP;  potassium chloride SA (K-DUR,KLOR-CON) CR tablet 40 mEq, 40 mEq, Oral, BID, [name] D Clegg, NP, 40 mEq at 11/11/13 2118 simvastatin (ZOCOR) tablet 40 mg, 40 mg, Oral, q1800, [name] D Clegg, NP, 40 mg at 11/11/13 1716;  Tadalafil (PAH) TABS 40 mg, 40 mg, Oral, Daily, [name] D Clegg, NP, 40 mg at 11/11/13 1014  Please HPI for pertinent positives, otherwise complete 10 system ROS negative.  Physical Exam: BP 108/61  Pulse 96  Temp(Src) 97.5 F (36.4 C) (Oral)  Resp 18  Ht $R'5\' 7"'Oc$  (1.702 m)  Wt 158 lb 11.2 oz (71.986 kg)  BMI 24.85 kg/m2  SpO2 98% Body mass index is 24.85 kg/(m^2).   General Appearance:  Alert, cooperative, no distress, appears stated age  Head:  Normocephalic, without obvious abnormality, atraumatic  ENT: Unremarkable  Neck: Supple, symmetrical, trachea midline  Lungs:   Clear to auscultation bilaterally, no w/r/r, respirations unlabored without  use of accessory muscles.  Chest Wall:  No tenderness or deformity  Heart:  Irregular rate and rhythm  Neurologic: Normal affect, no gross deficits.   Results for orders placed during the hospital encounter of 11/07/13 (from the past 48 hour(s))  HEPARIN LEVEL (UNFRACTIONATED)     Status: None   Collection Time    11/10/13  4:46 PM      Result Value Ref Range   Heparin Unfractionated 0.52  0.30 - 0.70 IU/mL   Comment:            IF HEPARIN RESULTS ARE BELOW     EXPECTED VALUES, AND PATIENT     DOSAGE HAS BEEN CONFIRMED,     SUGGEST FOLLOW UP TESTING     OF ANTITHROMBIN III LEVELS.  PROTIME-INR     Status: Abnormal   Collection Time    11/11/13  5:06 AM      Result Value Ref Range   Prothrombin Time 16.2 (*) 11.6 - 15.2 seconds   INR 1.33  0.00 - 1.49  HEPARIN LEVEL (UNFRACTIONATED)     Status: Abnormal   Collection Time    11/11/13  5:06 AM      Result Value Ref Range   Heparin Unfractionated 0.24 (*) 0.30 - 0.70 IU/mL   Comment:            IF HEPARIN RESULTS ARE BELOW     EXPECTED VALUES, AND PATIENT     DOSAGE HAS BEEN CONFIRMED,     SUGGEST FOLLOW UP TESTING     OF ANTITHROMBIN III LEVELS.  CBC     Status: Abnormal   Collection Time    11/11/13  5:06 AM      Result Value Ref Range   WBC 8.3  4.0 - 10.5 K/uL   RBC 4.03 (*) 4.22 - 5.81 MIL/uL   Hemoglobin 12.5 (*) 13.0 - 17.0 g/dL   HCT 36.9 (*) 39.0 - 52.0 %   MCV 91.6  78.0 - 100.0 fL   MCH 31.0  26.0 - 34.0 pg   MCHC 33.9  30.0 - 36.0 g/dL   RDW 14.6  11.5 - 15.5 %   Platelets 176  150 - 400 K/uL  HEPARIN LEVEL (UNFRACTIONATED)     Status: None   Collection Time    11/11/13  3:49 PM      Result Value Ref Range   Heparin Unfractionated 0.51  0.30 - 0.70 IU/mL   Comment:            IF HEPARIN RESULTS ARE BELOW     EXPECTED VALUES, AND PATIENT     DOSAGE HAS BEEN CONFIRMED,     SUGGEST FOLLOW UP TESTING     OF ANTITHROMBIN III LEVELS.  PROTIME-INR     Status: None   Collection Time    11/12/13  5:15 AM       Result Value Ref Range   Prothrombin Time 14.3  11.6 - 15.2 seconds   INR  1.13  0.00 - 1.49  HEPARIN LEVEL (UNFRACTIONATED)     Status: None   Collection Time    11/12/13  5:15 AM      Result Value Ref Range   Heparin Unfractionated 0.48  0.30 - 0.70 IU/mL   Comment:            IF HEPARIN RESULTS ARE BELOW     EXPECTED VALUES, AND PATIENT     DOSAGE HAS BEEN CONFIRMED,     SUGGEST FOLLOW UP TESTING     OF ANTITHROMBIN III LEVELS.  CBC     Status: None   Collection Time    11/12/13  5:15 AM      Result Value Ref Range   WBC 9.1  4.0 - 10.5 K/uL   RBC 4.38  4.22 - 5.81 MIL/uL   Hemoglobin 13.6  13.0 - 17.0 g/dL   HCT 40.4  39.0 - 52.0 %   MCV 92.2  78.0 - 100.0 fL   MCH 31.1  26.0 - 34.0 pg   MCHC 33.7  30.0 - 36.0 g/dL   RDW 14.8  11.5 - 15.5 %   Platelets 202  150 - 400 K/uL  BASIC METABOLIC PANEL     Status: Abnormal   Collection Time    11/12/13  5:15 AM      Result Value Ref Range   Sodium 138  137 - 147 mEq/L   Potassium 3.9  3.7 - 5.3 mEq/L   Chloride 97  96 - 112 mEq/L   CO2 26  19 - 32 mEq/L   Glucose, Bld 123 (*) 70 - 99 mg/dL   BUN 65 (*) 6 - 23 mg/dL   Creatinine, Ser 1.54 (*) 0.50 - 1.35 mg/dL   Calcium 9.1  8.4 - 10.5 mg/dL   GFR calc non Af Amer 41 (*) >90 mL/min   GFR calc Af Amer 48 (*) >90 mL/min   Comment: (NOTE)     The eGFR has been calculated using the CKD EPI equation.     This calculation has not been validated in all clinical situations.     eGFR's persistently <90 mL/min signify possible Chronic Kidney     Disease.   No results found.  Assessment/Plan Retroperitoneal mass Imaging reviewed by Dr. Anselm Pancoast Since pt has already received sedation today for TEE, he is not able to consent for himself today. Family is not present or reachable by phone Will plan for biopsy tomorrow under CT guidance. Keep NPO p MN Will need to hold heparin 2-3 hours prior to expected procedure time, which I do not know at present. Will obtain consent prior to  procedure with interpreter.  Ascencion Dike PA-C 11/12/2013, 11:18 AM

## 2013-11-12 NOTE — Interval H&P Note (Signed)
History and Physical Interval Note:  11/12/2013 9:15 AM  Tyler Christensen  has presented today for surgery, with the diagnosis of bacteremia  The various methods of treatment have been discussed with the patient and family. After consideration of risks, benefits and other options for treatment, the patient has consented to  Procedure(s): TRANSESOPHAGEAL ECHOCARDIOGRAM (TEE) (N/A) as a surgical intervention .  The patient's history has been reviewed, patient examined, no change in status, stable for surgery.  I have reviewed the patient's chart and labs.  Questions were answered to the patient's satisfaction.     [employer]

## 2013-11-12 NOTE — Progress Notes (Addendum)
INFECTIOUS DISEASE PROGRESS NOTE  ID: Tyler Christensen is a 80 y.o. male with  Active Problems:   Acute on chronic systolic heart failure  Subjective: C/o back pain.    Abtx:  Anti-infectives   Start     Dose/Rate Route Frequency Ordered Stop   11/09/13 1100  vancomycin (VANCOCIN) IVPB 750 mg/150 ml premix  Status:  Discontinued     750 mg 150 mL/hr over 60 Minutes Intravenous Every 24 hours 11/08/13 1034 11/10/13 1443   11/08/13 1200  ceFAZolin (ANCEF) IVPB 1 g/50 mL premix     1 g 100 mL/hr over 30 Minutes Intravenous Every 12 hours 11/08/13 1103     11/08/13 1100  vancomycin (VANCOCIN) IVPB 1000 mg/200 mL premix     1,000 mg 200 mL/hr over 60 Minutes Intravenous  Once 11/08/13 1034 11/08/13 1259      Medications:  Scheduled: . carvedilol  3.125 mg Oral BID WC  .  ceFAZolin (ANCEF) IV  1 g Intravenous Q12H  . furosemide  80 mg Intravenous BID  . potassium chloride  40 mEq Oral BID  . simvastatin  40 mg Oral q1800  . Tadalafil (PAH)  40 mg Oral Daily    Objective: Vital signs in last 24 hours: Temp:  [97.2 F (36.2 C)-97.9 F (36.6 C)] 97.5 F (36.4 C) (05/26 0847) Pulse Rate:  [82-106] 100 (05/26 0943) Resp:  [13-30] 30 (05/26 0943) BP: (96-127)/(59-84) 96/61 mmHg (05/26 0943) SpO2:  [94 %-100 %] 96 % (05/26 0943) Weight:  [71.986 kg (158 lb 11.2 oz)] 71.986 kg (158 lb 11.2 oz) (05/26 0622)   General appearance: alert, cooperative and no distress Resp: clear to auscultation bilaterally Cardio: regular rate and rhythm GI: normal findings: bowel sounds normal and soft, non-tender  Lab Results  Recent Labs  11/10/13 0307 11/11/13 0506 11/12/13 0515  WBC 8.9 8.3 9.1  HGB 12.4* 12.5* 13.6  HCT 36.3* 36.9* 40.4  NA 137  --  138  K 3.1*  --  3.9  CL 93*  --  97  CO2 30  --  26  BUN 76*  --  65*  CREATININE 2.05*  --  1.54*   Liver Panel No results found for this basename: PROT, ALBUMIN, AST, ALT, ALKPHOS, BILITOT, BILIDIR, IBILI,  in the last 72  hours Sedimentation Rate No results found for this basename: ESRSEDRATE,  in the last 72 hours C-Reactive Protein No results found for this basename: CRP,  in the last 72 hours  Microbiology: Recent Results (from the past 240 hour(s))  CULTURE, BLOOD (ROUTINE X 2)     Status: None   Collection Time    11/07/13  2:40 PM      Result Value Ref Range Status   Specimen Description BLOOD RIGHT ARM   Final   Special Requests BOTTLES DRAWN AEROBIC AND ANAEROBIC 10CC   Final   Culture  Setup Time     Final   Value: 11/07/2013 20:32     Performed at [facility]   Culture     Final   Value: STAPHYLOCOCCUS SPECIES (COAGULASE NEGATIVE)     Note: RIFAMPIN AND GENTAMICIN SHOULD NOT BE USED AS SINGLE DRUGS FOR TREATMENT OF STAPH INFECTIONS.     Note: Gram Stain Report Called to,Read Back By and Verified With: CHAT D@10 :16AM ON 11/08/13 BY DANTS     Performed at [facility]   Report Status 11/10/2013 FINAL   Final   Organism ID, Bacteria STAPHYLOCOCCUS SPECIES (COAGULASE  NEGATIVE)   Final  CULTURE, BLOOD (ROUTINE X 2)     Status: None   Collection Time    11/07/13  2:55 PM      Result Value Ref Range Status   Specimen Description BLOOD RIGHT ANTECUBITAL   Final   Special Requests BOTTLES DRAWN AEROBIC AND ANAEROBIC 10CC   Final   Culture  Setup Time     Final   Value: 11/07/2013 20:31     Performed at [facility]   Culture     Final   Value: STAPHYLOCOCCUS SPECIES (COAGULASE NEGATIVE)     Note: SUSCEPTIBILITIES PERFORMED ON PREVIOUS CULTURE WITHIN THE LAST 5 DAYS.     Note: Gram Stain Report Called to,Read Back By and Verified With: CHAT D@10 :16AM ON 11/08/13 BY DANTS     Performed at [facility]   Report Status 11/10/2013 FINAL   Final  MRSA PCR SCREENING     Status: None   Collection Time    11/07/13  3:48 PM      Result Value Ref Range Status   MRSA by PCR NEGATIVE  NEGATIVE Final   Comment:            The GeneXpert MRSA Assay (FDA     approved for  NASAL specimens     only), is one component of a     comprehensive MRSA colonization     surveillance program. It is not     intended to diagnose MRSA     infection nor to guide or     monitor treatment for     MRSA infections.  URINE CULTURE     Status: None   Collection Time    11/07/13 11:55 PM      Result Value Ref Range Status   Specimen Description URINE, CLEAN CATCH   Final   Special Requests none Normal   Final   Culture  Setup Time     Final   Value: 11/08/2013 04:18     Performed at [facility]     Final   Value: 25,000 COLONIES/ML     Performed at [facility]   Culture     Final   Value: Multiple bacterial morphotypes present, none predominant. Suggest appropriate recollection if clinically indicated.     Performed at [facility]   Report Status 11/09/2013 FINAL   Final  CLOSTRIDIUM DIFFICILE BY PCR     Status: None   Collection Time    11/08/13 12:03 PM      Result Value Ref Range Status   C difficile by pcr NEGATIVE  NEGATIVE Final  CULTURE, BLOOD (ROUTINE X 2)     Status: None   Collection Time    11/08/13  4:55 PM      Result Value Ref Range Status   Specimen Description BLOOD RIGHT HAND   Final   Special Requests BOTTLES DRAWN AEROBIC ONLY 5CC   Final   Culture  Setup Time     Final   Value: 11/08/2013 20:38     Performed at [facility]   Culture     Final   Value:        BLOOD CULTURE RECEIVED NO GROWTH TO DATE CULTURE WILL BE HELD FOR 5 DAYS BEFORE ISSUING A FINAL NEGATIVE REPORT     Performed at [facility]   Report Status PENDING   Incomplete    Studies/Results: No results found.  Assessment/Plan: MSSE bacteremia  ICD  ? Liposarcoma  ARF on CRF  Total days of antibiotics: 3 (vanco/ancef)  Cr improving TEE (-) Will change to ancef alone Would treat him 2 weeks from date of PIC removal (5-22 ---> 6-5) with TEE (-) Would repeat his BCx 1 week after he finishes anbx (due to  presence of retained hardware/ICD) Await Bx available if questions, thanks         [facility] (pager) [phone] [email or web address] 11/12/2013, 9:45 AM  LOS: 5 days   **Disclaimer: This note may have been dictated with voice recognition software. Similar sounding words can inadvertently be transcribed and this note may contain transcription errors which may not have been corrected upon publication of note.**

## 2013-11-12 NOTE — CV Procedure (Signed)
No vegetations. Device leads normal.  EF 20% Mild MR/TR, trace AI  Candee Furbish, MD

## 2013-11-12 NOTE — Progress Notes (Signed)
UR completed Rayshaun Needle K. Jebediah Macrae, RN, BSN, [place], CCM  11/12/2013 10:27 AM

## 2013-11-12 NOTE — Progress Notes (Signed)
Subjective:   Stable overnight. TEE this am with no vegetations and device leads normal. EF 20% and mild MR/TR and trace AI. Denies SOB, orthopnea or CP.   Objective:  Vital Signs in the last 24 hours: BP 108/61  Pulse 96  Temp(Src) 97.5 F (36.4 C) (Oral)  Resp 18  Ht 5\' 7"  (1.702 m)  Wt 158 lb 11.2 oz (71.986 kg)  BMI 24.85 kg/m2  SpO2 98%  Physical Exam: Sitting on side of bed. no acute distress Neck: JVP 9 Lungs:  Clear  Cardiac:  Irregular rhythm, normal S1 and S2, no S3 Abdomen:  Mildly distended. Nontender. Good BS Extremities:  No edema present Warm Neuro: non focal.   Intake/Output from previous day: 05/25 0701 - 05/26 0700 In: 1624.5 [P.O.:1080; I.V.:444.5; IV Piggyback:100] Out: 2460 [Urine:2460] Weight Filed Weights   11/10/13 0439 11/11/13 0611 11/12/13 0622  Weight: 159 lb 3.2 oz (72.213 kg) 159 lb (72.122 kg) 158 lb 11.2 oz (71.986 kg)    Lab Results: Basic Metabolic Panel:  Recent Labs  11/10/13 0307 11/12/13 0515  NA 137 138  K 3.1* 3.9  CL 93* 97  CO2 30 26  GLUCOSE 120* 123*  BUN 76* 65*  CREATININE 2.05* 1.54*    CBC:  Recent Labs  11/11/13 0506 11/12/13 0515  WBC 8.3 9.1  HGB 12.5* 13.6  HCT 36.9* 40.4  MCV 91.6 92.2  PLT 176 202    BNP    Component Value Date/Time   PROBNP 9356.0* 11/07/2013 0708    PROTIME: Lab Results  Component Value Date   INR 1.13 11/12/2013   INR 1.33 11/11/2013   INR 1.61* 11/10/2013    Telemetry: Paced rhythm  Assessment/Plan:  1. Chronic systolic heart failure on home milrinone 2. Pulmonary hypertension 3. Staph sepsis 4. Indwelling implantable defibrillator and previous PICC line 5. Atrial fibrillation 6. Chronic anticoagulation 7. 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 Chronic kidney disease 9. Hypokalemia   Recommendations:  Stable overnight. TEE this am showing no vegetations and device leads normal. INR 1.13 will continue heparin and will restart coumadin after biopsy. Will  go for biopsy tomorrow.  Volume status appears to be approaching baseline will continue lasix 80 mg IV BID and hopefully transition to PO tomorrow. Cr stable.  ID ok with reinserting PICC will place tomorrow once able to consent. Will place PICC in RUE appears to have superficial rash from where PICC was in L upper arm.    Rande Brunt, NP-C 10:27 AM  Patient seen and examined with Junie Bame, NP. We discussed all aspects of the encounter. I agree with the assessment and plan as stated above.   Remains stable. Results of TEE reviewed. No vegetations. Will continue IV diuresis one more day. IR to proceed with biopsy of RP lesion tomorrow. Will likely need RHC prior to d/c.  Cover with heparin. Replace PICC.  Shaune Pascal Bensimhon,MD 12:50 PM

## 2013-11-12 NOTE — Progress Notes (Signed)
Echocardiogram 2D Echocardiogram has been performed.  Tyler Christensen 11/12/2013, 11:02 AM

## 2013-11-12 NOTE — H&P (View-Only) (Signed)
Subjective:   Doing well. Feels ab distension is improving. No fevers/chills. No dyspnea. INR 1.3. On heparin now. Weigh stable.  Objective:  Vital Signs in the last 24 hours: BP 112/59  Pulse 96  Temp(Src) 98 F (36.7 C) (Oral)  Resp 20  Ht 5\' 7"  (1.702 m)  Wt 72.122 kg (159 lb)  BMI 24.90 kg/m2  SpO2 95%  Physical Exam: Walking around room. no acute distress Lungs:  Clear  Cardiac:  Irregular rhythm, normal S1 and S2, no S3 Abdomen:  Mildly distended. Nontender. Good BS Extremities:  No edema present Warm Neuro: non focal.   Intake/Output from previous day: 05/24 0701 - 05/25 0700 In: 1090 [P.O.:800; I.V.:140; IV Piggyback:150] Out: 1800 [Urine:1800] Weight Filed Weights   11/09/13 0530 11/10/13 0439 11/11/13 0611  Weight: 72.53 kg (159 lb 14.4 oz) 72.213 kg (159 lb 3.2 oz) 72.122 kg (159 lb)    Lab Results: Basic Metabolic Panel:  Recent Labs  11/09/13 0523 11/10/13 0307  NA 139 137  K 3.2* 3.1*  CL 97 93*  CO2 29 30  GLUCOSE 134* 120*  BUN 71* 76*  CREATININE 2.00* 2.05*    CBC:  Recent Labs  11/10/13 0307 11/11/13 0506  WBC 8.9 8.3  HGB 12.4* 12.5*  HCT 36.3* 36.9*  MCV 91.9 91.6  PLT 157 176    BNP    Component Value Date/Time   PROBNP 9356.0* 11/07/2013 0708    PROTIME: Lab Results  Component Value Date   INR 1.33 11/11/2013   INR 1.61* 11/10/2013   INR 1.80* 11/09/2013    Telemetry: Paced rhythm  Assessment/Plan:  1. Chronic systolic heart failure on home milrinone 2. Pulmonary hypertension 3. Staph sepsis 4. Indwelling implantable defibrillator and previous PICC line 5. Atrial fibrillation 6. Chronic anticoagulation 7. 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 Chronic kidney disease 9. Hypokalemia   Recommendations:  INR down. On heparin. Will need to arrange TEE to look at device given staph sepsis. Will also need IR to biopsy RP mass. I will call them today to discuss timing (? Tomorrow or Wed). Will need PICC back  soon. NPO after MN. TEE order written. Supp K+.   Camilia Caywood R Greco Gastelum,MD 10:02 AM

## 2013-11-12 NOTE — Progress Notes (Signed)
ANTICOAGULATION CONSULT NOTE - [place] for Heparin Indication: atrial fibrillation  Allergies  Allergen Reactions  . Lisinopril Rash    Patient Measurements: Height: 5\' 7"  (170.2 cm) Weight: 158 lb 11.2 oz (71.986 kg) (Scale A.) IBW/kg (Calculated) : 66.1 Heparin Dosing Weight: 72.5 kg  Vital Signs: Temp: 97.5 F (36.4 C) (05/26 0847) Temp src: Oral (05/26 0847) BP: 108/61 mmHg (05/26 1006) Pulse Rate: 96 (05/26 1006)  Labs:  Recent Labs  11/10/13 0307  11/11/13 0506 11/11/13 1549 11/12/13 0515  HGB 12.4*  --  12.5*  --  13.6  HCT 36.3*  --  36.9*  --  40.4  PLT 157  --  176  --  202  LABPROT 18.7*  --  16.2*  --  14.3  INR 1.61*  --  1.33  --  1.13  HEPARINUNFRC <0.10*  < > 0.24* 0.51 0.48  CREATININE 2.05*  --   --   --  1.54*  < > = values in this interval not displayed.  Estimated Creatinine Clearance: 36.4 ml/min (by C-G formula based on Cr of 1.54).   Medical History: Past Medical History  Diagnosis Date  . HTN (hypertension)   . High cholesterol   . CHF (congestive heart failure)   . BBBB (bilateral bundle branch block)   . LBBB (left bundle branch block)   . Weakness   . Tired   . Seizures 06/11/2012    new onset/notes (06/11/2012)  . SOB (shortness of breath)     "sometimes when I lay down;; related to not taking my RX" (06/11/2012)  . Myocardial infarction     06/10/2012  . Atrial fibrillation   . Stroke   . Atrial thrombus     left  . Pulmonary HTN    Assessment: 80 y/o M on chronic anticoagulation (Coumadin) for afib and h/o CVA.  Coumadin on hold for biopsy in IR for possible liposarcoma 5/27.  INR down to 1.13, CBC stable,  Heparin drip 1350 uts/hr HL 0.48 therapeutic.  No s/s of bleeding noted. Follow up restart coumadin after IR.     Goal of Therapy:  INR 2-3 Heparin level 0.3-0.7 units/ml Monitor platelets by anticoagulation protocol: Yes   Plan:  - Increase IV heparin to 1350units/hr - Monitor  daily CBC, heparin level, INR, and s/s of bleeding  Bonnita Nasuti Pharm.D. CPP, BCPS Clinical Pharmacist [phone] 11/12/2013 1:03 PM

## 2013-11-13 ENCOUNTER — Inpatient Hospital Stay (HOSPITAL_COMMUNITY): Payer: Medicare Other

## 2013-11-13 ENCOUNTER — Encounter (HOSPITAL_COMMUNITY): Payer: Self-pay | Admitting: Cardiology

## 2013-11-13 ENCOUNTER — Encounter (HOSPITAL_COMMUNITY): Payer: Medicare Other

## 2013-11-13 LAB — CBC
HCT: 40.3 % (ref 39.0–52.0)
Hemoglobin: 13.7 g/dL (ref 13.0–17.0)
MCH: 31.5 pg (ref 26.0–34.0)
MCHC: 34 g/dL (ref 30.0–36.0)
MCV: 92.6 fL (ref 78.0–100.0)
PLATELETS: 243 10*3/uL (ref 150–400)
RBC: 4.35 MIL/uL (ref 4.22–5.81)
RDW: 15.1 % (ref 11.5–15.5)
WBC: 12.7 10*3/uL — ABNORMAL HIGH (ref 4.0–10.5)

## 2013-11-13 LAB — HEPARIN LEVEL (UNFRACTIONATED): Heparin Unfractionated: 0.45 IU/mL (ref 0.30–0.70)

## 2013-11-13 LAB — PROTIME-INR
INR: 1.1 (ref 0.00–1.49)
PROTHROMBIN TIME: 14 s (ref 11.6–15.2)

## 2013-11-13 MED ORDER — WARFARIN SODIUM 4 MG PO TABS
4.0000 mg | ORAL_TABLET | Freq: Once | ORAL | Status: AC
  Administered 2013-11-13: 4 mg via ORAL
  Filled 2013-11-13: qty 1

## 2013-11-13 MED ORDER — FENTANYL CITRATE 0.05 MG/ML IJ SOLN
INTRAMUSCULAR | Status: AC
  Filled 2013-11-13: qty 4

## 2013-11-13 MED ORDER — WARFARIN - PHARMACIST DOSING INPATIENT
Freq: Every day | Status: DC
  Administered 2013-11-16 – 2013-11-21 (×3)

## 2013-11-13 MED ORDER — MIDAZOLAM HCL 2 MG/2ML IJ SOLN
INTRAMUSCULAR | Status: AC
  Filled 2013-11-13: qty 4

## 2013-11-13 MED ORDER — HEPARIN (PORCINE) IN NACL 100-0.45 UNIT/ML-% IJ SOLN
1400.0000 [IU]/h | INTRAMUSCULAR | Status: DC
  Administered 2013-11-13: 1350 [IU]/h via INTRAVENOUS
  Administered 2013-11-14 – 2013-11-18 (×6): 1500 [IU]/h via INTRAVENOUS
  Administered 2013-11-18 (×2): 1400 [IU]/h via INTRAVENOUS
  Filled 2013-11-13 (×10): qty 250

## 2013-11-13 MED ORDER — FENTANYL CITRATE 0.05 MG/ML IJ SOLN
INTRAMUSCULAR | Status: AC | PRN
  Administered 2013-11-13: 25 ug via INTRAVENOUS

## 2013-11-13 MED ORDER — SODIUM CHLORIDE 0.9 % IJ SOLN
10.0000 mL | INTRAMUSCULAR | Status: DC | PRN
  Administered 2013-11-14 – 2013-11-18 (×7): 10 mL

## 2013-11-13 MED ORDER — MIDAZOLAM HCL 2 MG/2ML IJ SOLN
INTRAMUSCULAR | Status: AC | PRN
  Administered 2013-11-13: 1 mg via INTRAVENOUS

## 2013-11-13 MED ORDER — TORSEMIDE 20 MG PO TABS
40.0000 mg | ORAL_TABLET | Freq: Two times a day (BID) | ORAL | Status: DC
  Administered 2013-11-13 (×2): 40 mg via ORAL
  Filled 2013-11-13 (×5): qty 2

## 2013-11-13 NOTE — Progress Notes (Addendum)
[place] for Heparin Indication: atrial fibrillation  Allergies  Allergen Reactions  . Lisinopril Rash    Patient Measurements: Height: 5\' 7"  (170.2 cm) Weight: 158 lb (71.668 kg) (scale a) IBW/kg (Calculated) : 66.1 Heparin Dosing Weight: 72.5 kg  Vital Signs: Temp: 97.5 F (36.4 C) (05/27 0927) Temp src: Oral (05/27 0927) BP: 114/68 mmHg (05/27 0927) Pulse Rate: 103 (05/27 0927)  Labs:  Recent Labs  11/11/13 0506 11/11/13 1549 11/12/13 0515 11/13/13 0549  HGB 12.5*  --  13.6 13.7  HCT 36.9*  --  40.4 40.3  PLT 176  --  202 243  LABPROT 16.2*  --  14.3 14.0  INR 1.33  --  1.13 1.10  HEPARINUNFRC 0.24* 0.51 0.48 0.45  CREATININE  --   --  1.54*  --     Estimated Creatinine Clearance: 36.4 ml/min (by C-G formula based on Cr of 1.54).   Assessment: 80 y/o M on chronic anticoagulation (Coumadin) for afib and h/o CVA.  Coumadin on hold for biopsy in IR for possible liposarcoma today 5/27,heparin placed on hold this am.  INR down to 1.1, CBC stable,  Heparin drip prior to stopping was at 1350 uts/hr with HL 0.45 therapeutic.   No s/s of bleeding noted. Follow up restart coumadin +/- heparin after IR.     Goal of Therapy:  INR 2-3 Heparin level 0.3-0.7 units/ml Monitor platelets by anticoagulation protocol: Yes   Plan:  - Hold heparin for now, follow up restart AC 4 hours post procedure if without complications - Monitor daily CBC, heparin level, INR, and s/s of bleeding  Erin Hearing PharmD., BCPS Clinical Pharmacist Pager [phone] 11/13/2013 9:56 AM

## 2013-11-13 NOTE — Progress Notes (Signed)
[place] for Heparin Indication: atrial fibrillation  Allergies  Allergen Reactions  . Lisinopril Rash    Patient Measurements: Height: 5\' 7"  (170.2 cm) Weight: 158 lb (71.668 kg) (scale a) IBW/kg (Calculated) : 66.1 Heparin Dosing Weight: 72.5 kg  Vital Signs: Temp: 98.4 F (36.9 C) (05/27 1330) Temp src: Oral (05/27 1330) BP: 112/54 mmHg (05/27 1650) Pulse Rate: 103 (05/27 1650)  Labs:  Recent Labs  11/11/13 0506 11/11/13 1549 11/12/13 0515 11/13/13 0549  HGB 12.5*  --  13.6 13.7  HCT 36.9*  --  40.4 40.3  PLT 176  --  202 243  LABPROT 16.2*  --  14.3 14.0  INR 1.33  --  1.13 1.10  HEPARINUNFRC 0.24* 0.51 0.48 0.45  CREATININE  --   --  1.54*  --     Estimated Creatinine Clearance: 36.4 ml/min (by C-G formula based on Cr of 1.54).   Assessment: 80 y/o M on chronic anticoagulation (Coumadin) for afib and h/o CVA.  Coumadin on hold for biopsy in IR for possible liposarcoma today 5/27,heparin placed on hold this am.  INR down to 1.1, CBC stable,  Heparin drip prior to stopping was at 1350 uts/hr with HL 0.45 therapeutic. Ok to resume heparin tonight at 7pm and resume coumadin today.    Goal of Therapy:  INR 2-3 Heparin level 0.3-0.7 units/ml Monitor platelets by anticoagulation protocol: Yes   Plan:   Heparin at 1350 units/hr 8 hr heparin level Coumadin 4mg  PO x1 Daily INR/heparin level

## 2013-11-13 NOTE — Sedation Documentation (Signed)
Interpretor s/w pt. Pt denied pain after procedure.  VSS.

## 2013-11-13 NOTE — Progress Notes (Signed)
Subjective:   TEE yesterday with no vegetation and device leads normal. Denies SOB, orthopnea or CP. EF 20% and mild MR/TR and trace AI. Going to IR for biopsy of RP lesion.   Objective:  Vital Signs in the last 24 hours: BP 115/74  Pulse 88  Temp(Src) 97.4 F (36.3 C) (Oral)  Resp 18  Ht 5\' 7"  (1.702 m)  Wt 158 lb (71.668 kg)  BMI 24.74 kg/m2  SpO2 98%  Physical Exam: Sitting on side of bed. no acute distress Neck: JVP 8 Lungs:  Clear  Cardiac:  Irregular rhythm, normal S1 and S2, no S3 Abdomen:  Mildly distended. Nontender. Good BS Extremities:  No edema present Warm Neuro: non focal.   Telemetery: Vpaced, afib  Intake/Output from previous day: 05/26 0701 - 05/27 0700 In: 1081.6 [P.O.:720; I.V.:261.6; IV Piggyback:100] Out: 1680 [Urine:1680] Weight Filed Weights   11/11/13 0611 11/12/13 0622 11/13/13 0605  Weight: 159 lb (72.122 kg) 158 lb 11.2 oz (71.986 kg) 158 lb (71.668 kg)    Lab Results: Basic Metabolic Panel:  Recent Labs  11/12/13 0515  NA 138  K 3.9  CL 97  CO2 26  GLUCOSE 123*  BUN 65*  CREATININE 1.54*    CBC:  Recent Labs  11/12/13 0515 11/13/13 0549  WBC 9.1 12.7*  HGB 13.6 13.7  HCT 40.4 40.3  MCV 92.2 92.6  PLT 202 243    BNP    Component Value Date/Time   PROBNP 9356.0* 11/07/2013 0708    PROTIME: Lab Results  Component Value Date   INR 1.10 11/13/2013   INR 1.13 11/12/2013   INR 1.33 11/11/2013    Telemetry: Paced rhythm  Assessment/Plan:  1. Chronic systolic heart failure on home milrinone 2. Pulmonary hypertension 3. Staph sepsis 4. Indwelling implantable defibrillator and previous PICC line 5. Atrial fibrillation 6. Chronic anticoagulation 7. 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 Chronic kidney disease 9. Hypokalemia   Recommendations:  Stable overnight. Weight unchanged and volume status appears at baseline. No BMET this am and will recheck tomorrow. Will transition from IV lasix to PO torsemide today  40 gm BID.   INR 1.10 will continue heparin and will restart coumadin after biopsy. Will go for biopsy today.  ID ok with reinserting PICC today once he gets consent. Will place PICC in RUE appears to have superficial rash from where PICC was in L upper arm which is improving.  Will eventually need to consider whether we will repeat RHC to assess hemodynamics. He also continues to remain in afib. He is currently on heparin and coumadin will be restarted after procedure. Will need to try to cardiovert him back into NSR to help with HF symptoms. Could consider Cardioversion while in the hospital if not will need 4 weeks of therapeutic INRs and then schedule. Cr is better in the hospital and if remains stable along with GFR being greater than 30 could consider switching to NOAC.    Rande Brunt, NP-C 8:31 AM  Patient seen and examined with Junie Bame, NP. We discussed all aspects of the encounter. I agree with the assessment and plan as stated above.   Feeling better. Will get PICC line and IR biopsy of retroperitoneal mass today. Will then need to consider possible RHC and DC-CV. Change diuretics to po.  Shaune Pascal Bensimhon,MD 5:22 PM

## 2013-11-13 NOTE — Procedures (Signed)
CT guided core biopsies of the fatty lesion in the right retroperitoneum.  No immediate complication.

## 2013-11-13 NOTE — Sedation Documentation (Signed)
Pt denied pain per interpretor, Giulianna Nanetti.

## 2013-11-13 NOTE — Progress Notes (Signed)
Peripherally Inserted Central Catheter/Midline Placement  The IV Nurse has discussed with the patient and/or persons authorized to consent for the patient, the purpose of this procedure and the potential benefits and risks involved with this procedure.  The benefits include less needle sticks, lab draws from the catheter and patient may be discharged home with the catheter.  Risks include, but not limited to, infection, bleeding, blood clot (thrombus formation), and puncture of an artery; nerve damage and irregular heat beat.  Alternatives to this procedure were also discussed.  PICC/Midline Placement Documentation    With interpeter.    Tyler Christensen 11/13/2013, 2:53 PM

## 2013-11-13 NOTE — Sedation Documentation (Signed)
Pt in no distress awaiting transport s/w nurse through interpretor.

## 2013-11-13 NOTE — Progress Notes (Signed)
Interpreter Lesle Chris for RN Tiffany and XR

## 2013-11-14 LAB — CULTURE, BLOOD (ROUTINE X 2): Culture: NO GROWTH

## 2013-11-14 LAB — BASIC METABOLIC PANEL
BUN: 58 mg/dL — ABNORMAL HIGH (ref 6–23)
CO2: 26 meq/L (ref 19–32)
Calcium: 10 mg/dL (ref 8.4–10.5)
Chloride: 101 mEq/L (ref 96–112)
Creatinine, Ser: 1.98 mg/dL — ABNORMAL HIGH (ref 0.50–1.35)
GFR calc non Af Amer: 30 mL/min — ABNORMAL LOW (ref >90)
GFR, EST AFRICAN AMERICAN: 35 mL/min — AB (ref >90)
Glucose, Bld: 134 mg/dL — ABNORMAL HIGH (ref 70–99)
Potassium: 5.9 mEq/L — ABNORMAL HIGH (ref 3.7–5.3)
SODIUM: 141 meq/L (ref 137–147)

## 2013-11-14 LAB — CBC
HCT: 41.1 % (ref 39.0–52.0)
Hemoglobin: 13.9 g/dL (ref 13.0–17.0)
MCH: 31.7 pg (ref 26.0–34.0)
MCHC: 33.8 g/dL (ref 30.0–36.0)
MCV: 93.8 fL (ref 78.0–100.0)
PLATELETS: 259 10*3/uL (ref 150–400)
RBC: 4.38 MIL/uL (ref 4.22–5.81)
RDW: 15.3 % (ref 11.5–15.5)
WBC: 11.2 10*3/uL — AB (ref 4.0–10.5)

## 2013-11-14 LAB — PROTIME-INR
INR: 1.21 (ref 0.00–1.49)
Prothrombin Time: 15 seconds (ref 11.6–15.2)

## 2013-11-14 LAB — HEPARIN LEVEL (UNFRACTIONATED)
HEPARIN UNFRACTIONATED: 0.14 [IU]/mL — AB (ref 0.30–0.70)
HEPARIN UNFRACTIONATED: 0.35 [IU]/mL (ref 0.30–0.70)

## 2013-11-14 MED ORDER — LORAZEPAM 0.5 MG PO TABS
0.2500 mg | ORAL_TABLET | Freq: Once | ORAL | Status: AC
  Administered 2013-11-14: 0.25 mg via ORAL
  Filled 2013-11-14: qty 1

## 2013-11-14 MED ORDER — WARFARIN SODIUM 4 MG PO TABS
4.0000 mg | ORAL_TABLET | Freq: Once | ORAL | Status: AC
  Administered 2013-11-14: 4 mg via ORAL
  Filled 2013-11-14: qty 1

## 2013-11-14 NOTE — Progress Notes (Signed)
[place] for Heparin Indication: atrial fibrillation  Allergies  Allergen Reactions  . Lisinopril Rash   Patient Measurements: Height: 5\' 7"  (170.2 cm) Weight: 156 lb 6.4 oz (70.943 kg) (scale A) IBW/kg (Calculated) : 66.1 Heparin Dosing Weight: 72.5 kg  Vital Signs: Temp: 97.4 F (36.3 C) (05/28 0355) Temp src: Oral (05/28 0355) BP: 111/71 mmHg (05/28 0355) Pulse Rate: 99 (05/28 0355)  Labs:  Recent Labs  11/12/13 0515 11/13/13 0549 11/14/13 0532  HGB 13.6 13.7 13.9  HCT 40.4 40.3 41.1  PLT 202 243 259  LABPROT 14.3 14.0 15.0  INR 1.13 1.10 1.21  HEPARINUNFRC 0.48 0.45 0.14*  CREATININE 1.54*  --   --    Estimated Creatinine Clearance: 36.4 ml/min (by C-G formula based on Cr of 1.54).  Assessment: 80 y/o M on chronic anticoagulation (Coumadin) for afib and h/o CVA.  Coumadin on hold for biopsy in IR for possible liposarcoma today 5/27,heparin placed on hold this am.  INR down to 1.1, CBC stable,  Heparin drip prior to stopping was at 1350 uts/hr with HL 0.45 therapeutic. Ok to resume heparin tonight at 7pm and resume coumadin today.   5/28 HL now low on a rate of 1350 units/hr despite having been therapeutic.  Spoke with nurse who reports no IV line issues and no stop in therapy.  No noted bleeding complications.   Goal of Therapy:  INR 2-3 Heparin level 0.3-0.7 units/ml Monitor platelets by anticoagulation protocol: Yes   Plan:   1.  Will increase IV Heparin to 1500 units/hr 2.  Check 8 hr heparin level  Rober Minion, PharmD., MS Clinical Pharmacist Pager:  [phone] Thank you for allowing pharmacy to be part of this patients care team.

## 2013-11-14 NOTE — Progress Notes (Addendum)
[place] for Heparin/warfarin Indication: atrial fibrillation  Allergies  Allergen Reactions  . Lisinopril Rash   Patient Measurements: Height: 5\' 7"  (170.2 cm) Weight: 156 lb 6.4 oz (70.943 kg) (scale A) IBW/kg (Calculated) : 66.1 Heparin Dosing Weight: 72.5 kg  Vital Signs: Temp: 97.4 F (36.3 C) (05/28 0355) Temp src: Oral (05/28 0355) BP: 111/71 mmHg (05/28 0355) Pulse Rate: 99 (05/28 0355)  Labs:  Recent Labs  11/12/13 0515 11/13/13 0549 11/14/13 0532  HGB 13.6 13.7 13.9  HCT 40.4 40.3 41.1  PLT 202 243 259  LABPROT 14.3 14.0 15.0  INR 1.13 1.10 1.21  HEPARINUNFRC 0.48 0.45 0.14*  CREATININE 1.54*  --  1.98*   Estimated Creatinine Clearance: 28.3 ml/min (by C-G formula based on Cr of 1.98).  Assessment: 80 y/o M on chronic anticoagulation (Coumadin) for afib and h/o CVA.  Coumadin on hold for biopsy in IR for possible liposarcoma today 5/27,heparin placed on hold this am.  INR down to 1.1, CBC stable,  Heparin drip prior to stopping was at 1350 uts/hr with HL 0.45 therapeutic.  5/28 HL at goal on a rate of 1500 units/hr. No IV line issues and no stop in therapy.  No noted bleeding complications.   Goal of Therapy:  INR 2-3 Heparin level 0.3-0.7 units/ml Monitor platelets by anticoagulation protocol: Yes   Plan:   1.  Heparin at 1500 units/hr 2.  Check 8 hr heparin level 3.  Repeat warfarin 4mg  tonight 4.  Daily INR/HL/CBC  Erin Hearing PharmD., BCPS Clinical Pharmacist Pager [phone] 11/14/2013 8:38 AM  Thank you for allowing pharmacy to be part of this patients care team.

## 2013-11-14 NOTE — Progress Notes (Signed)
Subjective:   TEE 5/26 with no vegetation and device leads normal. Denies SOB, orthopnea or CP. EF 20% and mild MR/TR and trace AI. Went to IR for biopsy yesterday of RP lesion. PICC replaced in L arm. Restarted on coumadin.    Feels fine. No dyspnea. No back pain   Objective:  Vital Signs in the last 24 hours: BP 111/71  Pulse 99  Temp(Src) 97.4 F (36.3 C) (Oral)  Resp 20  Ht 5\' 7"  (1.702 m)  Wt 156 lb 6.4 oz (70.943 kg)  BMI 24.49 kg/m2  SpO2 100%  Physical Exam: Sitting on side of bed. no acute distress Neck: JVP flat Lungs:  Clear  Cardiac:  Irregular rhythm, normal S1 and S2, no S3 Abdomen:  Mildly distended. Nontender. Good BS Extremities:  No edema present Warm Neuro: non focal.   Telemetery: Vpaced, afib  Intake/Output from previous day: 05/27 0701 - 05/28 0700 In: 833 [P.O.:240; I.V.:593] Out: 475 [Urine:475] Weight Filed Weights   11/12/13 0622 11/13/13 0605 11/14/13 0551  Weight: 158 lb 11.2 oz (71.986 kg) 158 lb (71.668 kg) 156 lb 6.4 oz (70.943 kg)    Lab Results: Basic Metabolic Panel:  Recent Labs  11/12/13 0515 11/14/13 0532  NA 138 141  K 3.9 5.9*  CL 97 101  CO2 26 26  GLUCOSE 123* 134*  BUN 65* 58*  CREATININE 1.54* 1.98*    CBC:  Recent Labs  11/13/13 0549 11/14/13 0532  WBC 12.7* 11.2*  HGB 13.7 13.9  HCT 40.3 41.1  MCV 92.6 93.8  PLT 243 259    BNP    Component Value Date/Time   PROBNP 9356.0* 11/07/2013 0708    PROTIME: Lab Results  Component Value Date   INR 1.21 11/14/2013   INR 1.10 11/13/2013   INR 1.13 11/12/2013    Telemetry: Paced rhythm  Assessment/Plan:  1. Chronic systolic heart failure on home milrinone 2. Pulmonary hypertension 3. Staph sepsis 4. Indwelling implantable defibrillator and previous PICC line 5. Atrial fibrillation 6. Chronic anticoagulation 7. 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 Chronic kidney disease 9. Hypokalemia   Recommendations:   Rande Brunt, NP-C 10:10  AM  Patient seen and examined with Junie Bame, NP. We discussed all aspects of the encounter. I agree with above.   Feeling better. Now s/p PICC line and IR biopsy of retroperitoneal mass. Await pathology.  Renal function slightly worse today and he looks dry. Will hold diuretics. I think main issue for his symptoms is AF. Once we have biopsy would consider TEE/DC-CV. Would not do RHC now. Continue heparin. Will decide on anti-coagulation once we have plan for RP mass. Continue abx for staph sepsis.   Shaune Pascal Demontrez Rind[place],MD 12:45 PM

## 2013-11-14 NOTE — Progress Notes (Signed)
[place] for Heparin Indication: atrial fibrillation  Allergies  Allergen Reactions  . Lisinopril Rash   Patient Measurements: Height: 5\' 7"  (170.2 cm) Weight: 156 lb 6.4 oz (70.943 kg) (scale A) IBW/kg (Calculated) : 66.1 Heparin Dosing Weight: 72.5 kg  Vital Signs: Temp: 97.4 F (36.3 C) (05/28 0355) Temp src: Oral (05/28 0355) BP: 111/71 mmHg (05/28 0355) Pulse Rate: 99 (05/28 0355)  Labs:  Recent Labs  11/12/13 0515 11/13/13 0549 11/14/13 0532 11/14/13 1120  HGB 13.6 13.7 13.9  --   HCT 40.4 40.3 41.1  --   PLT 202 243 259  --   LABPROT 14.3 14.0 15.0  --   INR 1.13 1.10 1.21  --   HEPARINUNFRC 0.48 0.45 0.14* 0.35  CREATININE 1.54*  --  1.98*  --    Estimated Creatinine Clearance: 28.3 ml/min (by C-G formula based on Cr of 1.98).  Assessment: 80 y/o M on chronic anticoagulation (Coumadin) for afib and h/o CVA.  Coumadin on hold for biopsy in IR for possible liposarcoma today 5/27,heparin placed on hold this am.  INR down to 1.1, CBC stable,  Heparin drip prior to stopping was at 1350 uts/hr with HL 0.45 therapeutic. Ok to resume heparin tonight at 7pm and resume coumadin today.   Heparin level therapeutic   Goal of Therapy:  INR 2-3 Heparin level 0.3-0.7 units/ml Monitor platelets by anticoagulation protocol: Yes   Plan:   1.  Continue heparin at 1500 units / hr 2.  Follow up daily heparin level, cbc  Thank you. Anette Guarneri, PharmD [phone]

## 2013-11-15 DIAGNOSIS — R1900 Intra-abdominal and pelvic swelling, mass and lump, unspecified site: Secondary | ICD-10-CM

## 2013-11-15 LAB — BASIC METABOLIC PANEL
BUN: 54 mg/dL — ABNORMAL HIGH (ref 6–23)
CALCIUM: 9.6 mg/dL (ref 8.4–10.5)
CHLORIDE: 101 meq/L (ref 96–112)
CO2: 25 meq/L (ref 19–32)
Creatinine, Ser: 1.82 mg/dL — ABNORMAL HIGH (ref 0.50–1.35)
GFR calc Af Amer: 39 mL/min — ABNORMAL LOW (ref >90)
GFR calc non Af Amer: 34 mL/min — ABNORMAL LOW (ref >90)
GLUCOSE: 107 mg/dL — AB (ref 70–99)
Potassium: 5.1 mEq/L (ref 3.7–5.3)
Sodium: 140 mEq/L (ref 137–147)

## 2013-11-15 LAB — CBC
HEMATOCRIT: 37.7 % — AB (ref 39.0–52.0)
Hemoglobin: 12.5 g/dL — ABNORMAL LOW (ref 13.0–17.0)
MCH: 31.1 pg (ref 26.0–34.0)
MCHC: 33.2 g/dL (ref 30.0–36.0)
MCV: 93.8 fL (ref 78.0–100.0)
PLATELETS: 254 10*3/uL (ref 150–400)
RBC: 4.02 MIL/uL — ABNORMAL LOW (ref 4.22–5.81)
RDW: 15.6 % — AB (ref 11.5–15.5)
WBC: 10.8 10*3/uL — ABNORMAL HIGH (ref 4.0–10.5)

## 2013-11-15 LAB — PROTIME-INR
INR: 1.2 (ref 0.00–1.49)
PROTHROMBIN TIME: 14.9 s (ref 11.6–15.2)

## 2013-11-15 LAB — HEPARIN LEVEL (UNFRACTIONATED): Heparin Unfractionated: 0.46 IU/mL (ref 0.30–0.70)

## 2013-11-15 MED ORDER — WARFARIN SODIUM 6 MG PO TABS
6.0000 mg | ORAL_TABLET | Freq: Once | ORAL | Status: AC
  Administered 2013-11-15: 6 mg via ORAL
  Filled 2013-11-15: qty 1

## 2013-11-15 NOTE — Progress Notes (Addendum)
Subjective:   TEE 5/26 with no vegetation and device leads normal. Denies SOB, orthopnea or CP. EF 20% and mild MR/TR and trace AI. Pending results for abdomen biopsy. Remains on IV heparin. INR 1.2. Cr 1.82  Objective:  Vital Signs in the last 24 hours: BP 102/65  Pulse 78  Temp(Src) 97.6 F (36.4 C) (Oral)  Resp 18  Ht 5\' 7"  (1.702 m)  Wt 158 lb 8 oz (71.895 kg)  BMI 24.82 kg/m2  SpO2 94%  Physical Exam: Sitting on side of bed. no acute distress Neck: JVP flat Lungs:  Clear  Cardiac:  Irregular rhythm, normal S1 and S2, no S3 Abdomen:  Mildly distended. Nontender. Good BS Extremities:  No edema present Warm Neuro: non focal.   Telemetery: Vpaced, afib  Intake/Output from previous day: 05/28 0701 - 05/29 0700 In: 960 [P.O.:960] Out: 0  Weight Filed Weights   11/13/13 0605 11/14/13 0551 11/15/13 0549  Weight: 158 lb (71.668 kg) 156 lb 6.4 oz (70.943 kg) 158 lb 8 oz (71.895 kg)    Lab Results: Basic Metabolic Panel:  Recent Labs  11/14/13 0532 11/15/13 0530  NA 141 140  K 5.9* 5.1  CL 101 101  CO2 26 25  GLUCOSE 134* 107*  BUN 58* 54*  CREATININE 1.98* 1.82*    CBC:  Recent Labs  11/14/13 0532 11/15/13 0530  WBC 11.2* 10.8*  HGB 13.9 12.5*  HCT 41.1 37.7*  MCV 93.8 93.8  PLT 259 254    BNP    Component Value Date/Time   PROBNP 9356.0* 11/07/2013 0708    PROTIME: Lab Results  Component Value Date   INR 1.20 11/15/2013   INR 1.21 11/14/2013   INR 1.10 11/13/2013    Telemetry: Paced rhythm  Assessment/Plan:  1. Chronic systolic heart failure on home milrinone 2. Pulmonary hypertension 3. Staph sepsis 4. Indwelling implantable defibrillator and previous PICC line 5. Atrial fibrillation 6. Chronic anticoagulation 7. 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 Chronic kidney disease 9. Hypokalemia   Recommendations:  Stable overnight. Still waiting on results from IR biopsy of retroperitoneal mass. Awaiting interpreter. Patient denies  pain or SOB. Volume status stable and creatnine improving will continue to follow. Will restart PO diuretics today torsemide 20 mg BID  Continue on IV heparin for bridge. INR 1.2, pharmacy dosing.   I think main issue for his symptoms is AF. Once we have biopsy would consider TEE/DC-CV.  Rande Brunt, NP-C 8:20 AM  Patient seen and examined with Junie Bame, NP. We discussed all aspects of the encounter. I agree with above.   Doing well from HF perspective. Continue to await results of biopsy from retroperitoneal mass (I have called pathology 2x today). He has reverted to AF and I think this is why he has felt poorly for the past month. Once we get plan for his RP mass then we can decide about timing of TEE/DC-CV (once we cardiovert he needs to be on uninterrupted anticoagulation for at least 4 weeks). Continue heparin -> coumadin.   Shaune Pascal Bensimhon,MD 2:44 PM  Addendum: Spoke with pathologist (Dr. Claudette Laws) who favors fat necrosis and not liposarcoma. However, lab wants to send biopsy to [facility] for molecular staining. Results will be back next week. Thus will not cardiovert at this time. Continue to load coumadin. Ready for d/c when INR >= 2.0. Continue IV abx for staph bacteremia - duration per ID:  Treat with ancef alone  Would treat him 2 weeks from date of [facility]  removal (5-22 ---> 6-5) with TEE (-)  Would repeat his BCx 1 week after he finishes anbx (due to presence of retained hardware/ICD)  Shaune Pascal Bensimhon,MD 2:49 PM

## 2013-11-15 NOTE — Progress Notes (Signed)
Interpreter Lesle Chris for Tiffany RN Alisom PA

## 2013-11-15 NOTE — Progress Notes (Signed)
UR completed Chyla Schlender K. Jacilyn Sanpedro, RN, BSN, [place], CCM  11/15/2013 12:29 PM

## 2013-11-15 NOTE — Progress Notes (Signed)
ANTICOAGULATION CONSULT NOTE - Follow Up Consult  Pharmacy Consult for Heparin and Coumadin Indication: atrial fibrillation and hx CVA  Allergies  Allergen Reactions  . Lisinopril Rash    Patient Measurements: Height: 5\' 7"  (170.2 cm) Weight: 158 lb 8 oz (71.895 kg) (Scale A) IBW/kg (Calculated) : 66.1 Heparin Dosing Weight: 72kg  Vital Signs: Temp: 97.6 F (36.4 C) (05/29 0549) Temp src: Oral (05/29 0549) BP: 102/65 mmHg (05/29 0549) Pulse Rate: 78 (05/29 0549)  Labs:  Recent Labs  11/13/13 0549 11/14/13 0532 11/14/13 1120 11/15/13 0530  HGB 13.7 13.9  --  12.5*  HCT 40.3 41.1  --  37.7*  PLT 243 259  --  254  LABPROT 14.0 15.0  --  14.9  INR 1.10 1.21  --  1.20  HEPARINUNFRC 0.45 0.14* 0.35 0.46  CREATININE  --  1.98*  --  1.82*    Estimated Creatinine Clearance: 30.8 ml/min (by C-G formula based on Cr of 1.82).   Medications:  Heparin @ 1500 units/hr  Assessment: 81yom s/p biopsy of fatty lesion in the right RP resumed on heparin and coumadin for afib and hx CVA. Heparin level is at goal. INR remains below goal after 2 doses of coumadin. CBC is stable. No bleeding reported. Home dose: 4mg  daily except 6mg  on Friday  Goal of Therapy:  INR 2-3 Heparin level 0.3-0.7 units/ml Monitor platelets by anticoagulation protocol: Yes   Plan:  1) Continue heparin at 1500 units/hr 2) Increase coumadin to 6mg  x 1 3) Heparin level, INR, CBC in AM  [place] 11/15/2013,10:21 AM

## 2013-11-16 DIAGNOSIS — Z7901 Long term (current) use of anticoagulants: Secondary | ICD-10-CM

## 2013-11-16 LAB — BASIC METABOLIC PANEL
BUN: 55 mg/dL — ABNORMAL HIGH (ref 6–23)
CO2: 22 mEq/L (ref 19–32)
Calcium: 9.3 mg/dL (ref 8.4–10.5)
Chloride: 98 mEq/L (ref 96–112)
Creatinine, Ser: 1.77 mg/dL — ABNORMAL HIGH (ref 0.50–1.35)
GFR calc Af Amer: 40 mL/min — ABNORMAL LOW (ref >90)
GFR, EST NON AFRICAN AMERICAN: 35 mL/min — AB (ref >90)
Glucose, Bld: 101 mg/dL — ABNORMAL HIGH (ref 70–99)
Potassium: 4.4 mEq/L (ref 3.7–5.3)
SODIUM: 138 meq/L (ref 137–147)

## 2013-11-16 LAB — CARBOXYHEMOGLOBIN
Carboxyhemoglobin: 1.3 % (ref 0.5–1.5)
METHEMOGLOBIN: 0.9 % (ref 0.0–1.5)
O2 SAT: 58.2 %
TOTAL HEMOGLOBIN: 12.3 g/dL — AB (ref 13.5–18.0)

## 2013-11-16 LAB — CBC
HCT: 38 % — ABNORMAL LOW (ref 39.0–52.0)
Hemoglobin: 12.9 g/dL — ABNORMAL LOW (ref 13.0–17.0)
MCH: 31.7 pg (ref 26.0–34.0)
MCHC: 33.9 g/dL (ref 30.0–36.0)
MCV: 93.4 fL (ref 78.0–100.0)
PLATELETS: 264 10*3/uL (ref 150–400)
RBC: 4.07 MIL/uL — AB (ref 4.22–5.81)
RDW: 15.9 % — ABNORMAL HIGH (ref 11.5–15.5)
WBC: 9.5 10*3/uL (ref 4.0–10.5)

## 2013-11-16 LAB — HEPARIN LEVEL (UNFRACTIONATED): HEPARIN UNFRACTIONATED: 0.41 [IU]/mL (ref 0.30–0.70)

## 2013-11-16 LAB — PROTIME-INR
INR: 1.38 (ref 0.00–1.49)
PROTHROMBIN TIME: 16.6 s — AB (ref 11.6–15.2)

## 2013-11-16 MED ORDER — WARFARIN SODIUM 6 MG PO TABS
6.0000 mg | ORAL_TABLET | Freq: Once | ORAL | Status: AC
  Administered 2013-11-16: 6 mg via ORAL
  Filled 2013-11-16: qty 1

## 2013-11-16 MED ORDER — FUROSEMIDE 10 MG/ML IJ SOLN
60.0000 mg | Freq: Once | INTRAMUSCULAR | Status: AC
  Administered 2013-11-16: 60 mg via INTRAVENOUS
  Filled 2013-11-16: qty 6

## 2013-11-16 NOTE — Progress Notes (Signed)
ANTICOAGULATION/Antibiotic CONSULT NOTE - Follow Up Consult  Pharmacy Consult for Heparin and Coumadin/ cefazolin Indication: atrial fibrillation and hx CVA/ bacteremia  Allergies  Allergen Reactions  . Lisinopril Rash    Patient Measurements: Height: 5\' 7"  (170.2 cm) Weight: 156 lb 4.2 oz (70.88 kg) (Scale A) IBW/kg (Calculated) : 66.1 Heparin Dosing Weight: 72kg  Vital Signs: Temp: 97.2 F (36.2 C) (05/30 1333) Temp src: Oral (05/30 1333) BP: 96/62 mmHg (05/30 1333) Pulse Rate: 88 (05/30 1333)  Labs:  Recent Labs  11/14/13 0532 11/14/13 1120 11/15/13 0530 11/16/13 0300  HGB 13.9  --  12.5* 12.9*  HCT 41.1  --  37.7* 38.0*  PLT 259  --  254 264  LABPROT 15.0  --  14.9 16.6*  INR 1.21  --  1.20 1.38  HEPARINUNFRC 0.14* 0.35 0.46 0.41  CREATININE 1.98*  --  1.82* 1.77*    Estimated Creatinine Clearance: 31.6 ml/min (by C-G formula based on Cr of 1.77).  Assessment: 81yom s/p biopsy of fatty lesion in the right RP resumed on heparin and coumadin for afib and hx CVA. Heparin level is at goal. INR remains below goal, but increasing after 3 doses of coumadin. Hgb is slightly low, but stable and plt WNL. No bleeding reported. Home dose: 4mg  daily except 6mg  on Friday  Patient also on cefazolin per pharmacy for bacteremia.  He is afebrile and WBC is trending down to 9.5.  Per ID note to continue cefazolin until 6/5, which is 2 weeks from PICC line removal.  Renal function is improving.  May need to adjust the dose if renal function continues to improve.    Goal of Therapy:  INR 2-3 Heparin level 0.3-0.7 units/ml Monitor platelets by anticoagulation protocol: Yes   Plan:  1) Continue heparin at 1500 units/hr 2) Coumadin 6mg  x 1 again tonight 3) Heparin level, INR, CBC in AM 4) Continue cefazolin 1gm IV Q12H 5) Monitor renal function  Thank you, Vivia Ewing, PharmD Clinical Pharmacist - Resident Pager: [phone] Pharmacy: [phone] 11/16/2013 1:40 PM

## 2013-11-16 NOTE — Progress Notes (Addendum)
SUBJECTIVE:  Feels that abdomen is getting bigger.  History obtained through telephone interpreter.  Weight has increased a few pounds as well.   OBJECTIVE:   Vitals:   Filed Vitals:   11/15/13 1344 11/15/13 2029 11/15/13 2034 11/16/13 0454  BP: 97/56 89/42 94/76  101/68  Pulse: 92 84  82  Temp: 97.6 F (36.4 C) 97.5 F (36.4 C)  97.6 F (36.4 C)  TempSrc: Oral Oral  Oral  Resp: 20 18  18   Height:      Weight:    156 lb 4.2 oz (70.88 kg)  SpO2: 96% 93%  98%   I&O's:   Intake/Output Summary (Last 24 hours) at 11/16/13 1201 Last data filed at 11/15/13 1700  Gross per 24 hour  Intake    480 ml  Output    550 ml  Net    -70 ml   TELEMETRY: Reviewed telemetry pt in AFib, paced rhythm:     PHYSICAL EXAM General: Well developed, well nourished, in no acute distress Head:   Normal cephalic and atramatic  Lungs:  Clear bilaterally to auscultation. Heart:  Irregular, S1 S2  No JVD.   Abdomen: abdomen distended, firm Msk:  Back normal,  Normal strength and tone for age. Extremities:  Varicose veins, No edema.   Neuro: Alert and oriented. Psych:  Normal affect, responds appropriately   LABS: Basic Metabolic Panel:  Recent Labs  11/15/13 0530 11/16/13 0300  NA 140 138  K 5.1 4.4  CL 101 98  CO2 25 22  GLUCOSE 107* 101*  BUN 54* 55*  CREATININE 1.82* 1.77*  CALCIUM 9.6 9.3   Liver Function Tests: No results found for this basename: AST, ALT, ALKPHOS, BILITOT, PROT, ALBUMIN,  in the last 72 hours No results found for this basename: LIPASE, AMYLASE,  in the last 72 hours CBC:  Recent Labs  11/15/13 0530 11/16/13 0300  WBC 10.8* 9.5  HGB 12.5* 12.9*  HCT 37.7* 38.0*  MCV 93.8 93.4  PLT 254 264   Cardiac Enzymes: No results found for this basename: CKTOTAL, CKMB, CKMBINDEX, TROPONINI,  in the last 72 hours BNP: No components found with this basename: POCBNP,  D-Dimer: No results found for this basename: DDIMER,  in the last 72 hours Hemoglobin A1C: No  results found for this basename: HGBA1C,  in the last 72 hours Fasting Lipid Panel: No results found for this basename: CHOL, HDL, LDLCALC, TRIG, CHOLHDL, LDLDIRECT,  in the last 72 hours Thyroid Function Tests: No results found for this basename: TSH, T4TOTAL, FREET3, T3FREE, THYROIDAB,  in the last 72 hours Anemia Panel: No results found for this basename: VITAMINB12, FOLATE, FERRITIN, TIBC, IRON, RETICCTPCT,  in the last 72 hours Coag Panel:   Lab Results  Component Value Date   INR 1.38 11/16/2013   INR 1.20 11/15/2013   INR 1.21 11/14/2013    RADIOLOGY: Ct Abdomen Pelvis Wo Contrast  11/07/2013   CLINICAL DATA:  Abdominal pain with nausea.  EXAM: CT ABDOMEN AND PELVIS WITHOUT CONTRAST  TECHNIQUE: Multidetector CT imaging of the abdomen and pelvis was performed following the standard protocol without IV contrast.  COMPARISON:  None.  FINDINGS: There is cardiomegaly.  Is a tiny hiatal hernia.  There is suggestion of very tiny subtle stones in the gallbladder. There is no dilatation of the biliary tree in the gallbladder wall is not thickened in the gallbladder is not distended. Liver is normal. There are calcified granulomas in the otherwise normal spleen. Pancreas and adrenal  glands are normal. There are multiple diverticula scattered throughout the colon. The bowel is otherwise normal.  There are multiple low-density lesions in the right kidney, probably cysts, including an 11 mm lesion in the mid right kidney, a 3.8 mm lesion in the lower pole medially and 15 mm lesion laterally in the lower pole. There is a 5 cm cyst in the anterior aspect of the mid left kidney and exophytic 10 mm lesion on the left lower pole.  There is extensive calcification in the abdominal aorta and common iliac arteries. No acute osseous abnormality.  There is a 6.0 x 4.5 x 2.7 cm primarily fatty mass in the retroperitoneum between the upper pole of the right kidney and the upper lumbar spine and proximal soleus muscle. I  suspect this represents a liposarcoma. This was not present on the prior CT scan of 03/28/2004.  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  Possible tiny gallstones.  Probable bilateral renal cysts. This could be confirmed with ultrasound.  Diverticulosis.  No acute diverticulitis.   Electronically Signed   By: Rozetta Nunnery M.D.   On: 11/07/2013 11:15   Dg Chest 2 View  11/07/2013   CLINICAL DATA:  Chest pain, abdominal pain  EXAM: CHEST  2 VIEW  COMPARISON:  10/27/2012  FINDINGS: Cardiomegaly again noted. Three leads cardiac pacemaker is unchanged in position. Left arm PICC line stable in position. No acute infiltrate or pulmonary edema. Mild degenerative changes thoracic spine again noted.  IMPRESSION: Cardiomegaly again noted. No acute infiltrate or pulmonary edema. Stable left arm PICC line position.   Electronically Signed   By: Lahoma Crocker M.D.   On: 11/07/2013 07:00   Ct Biopsy  11/13/2013   CLINICAL DATA:  80 year old with congestive heart failure. Recent CT imaging demonstrated a suspicious fatty lesion in the right retroperitoneum. Tissue diagnosis is needed.  EXAM: CT GUIDED BIOPSY OF THE RIGHT RETROPERITONEAL LESION  Physician: Stephan Minister. Anselm Pancoast, MD  MEDICATIONS: 1 mg versed, 25 mcg fentanyl. A radiology nurse monitored the patient for moderate sedation.  ANESTHESIA/SEDATION: Sedation time: 16 min  PROCEDURE: The procedure was explained to the patient. The risks and benefits of the procedure were discussed and the patient's questions were addressed with an interpreter. Informed consent was obtained from the patient. The patient was placed on his left side because he could not breathe well prone. CT images of the abdomen were obtained. The fatty lesion in the right retroperitoneum was identified. The right side of the back was prepped and draped in sterile fashion. Skin was anesthetized with 1% lidocaine. A 17 gauge needle was directed into the fatty lesion with CT guidance.  Needle position was confirmed along the posterior aspect of the lesion. A total of 4 core biopsies were obtained with an 18 gauge core device. 17 gauge needle was removed without complication. Bandage placed over the puncture site.  FINDINGS: There is a fat attenuating lesion in the right retroperitoneum adjacent to the upper right psoas muscle. Needle position was confirmed along the posterior aspect of the lesion.  COMPLICATIONS: None  IMPRESSION: CT-guided core biopsies of the right retroperitoneal lesion.   Electronically Signed   By: Markus Daft M.D.   On: 11/13/2013 17:40   Dg Chest Port 1 View  11/13/2013   CLINICAL DATA:  Confirm line placement.  EXAM: PORTABLE CHEST - 1 VIEW  COMPARISON:  11/07/2013  FINDINGS: Right PICC line tip is in the SVC. Left pacer is unchanged. Cardiomegaly. Mild vascular  congestion without overt edema. No confluent opacities or effusions. No acute bony abnormality.  IMPRESSION: Right PICC line tip in the SVC.  Cardiomegaly, vascular congestion.   Electronically Signed   By: Rolm Baptise M.D.   On: 11/13/2013 15:27      ASSESSMENT: Tyler Christensen:   LV dysfunction: Increased abdominal girth. Torsemide not on med list.  Will give a dose of IV Lasix today.  CKD: Cr stable.  AFib: INR subtherapeutic.  IV heparin.  TEE DCCV in the future.  RP mass thought to be benign; being sent to [facility] for further review.  ID: Continue Ancef  Dispo: Home health planned for IV ABx.  INR needs to increase.  Tyler Booze, MD  11/16/2013  12:01 PM

## 2013-11-17 DIAGNOSIS — I5023 Acute on chronic systolic (congestive) heart failure: Secondary | ICD-10-CM | POA: Diagnosis not present

## 2013-11-17 DIAGNOSIS — I4891 Unspecified atrial fibrillation: Secondary | ICD-10-CM | POA: Diagnosis not present

## 2013-11-17 DIAGNOSIS — I5022 Chronic systolic (congestive) heart failure: Secondary | ICD-10-CM | POA: Diagnosis not present

## 2013-11-17 DIAGNOSIS — R7881 Bacteremia: Secondary | ICD-10-CM | POA: Diagnosis not present

## 2013-11-17 LAB — CBC
HEMATOCRIT: 36.4 % — AB (ref 39.0–52.0)
HEMOGLOBIN: 12.1 g/dL — AB (ref 13.0–17.0)
MCH: 31.3 pg (ref 26.0–34.0)
MCHC: 33.2 g/dL (ref 30.0–36.0)
MCV: 94.1 fL (ref 78.0–100.0)
Platelets: 253 10*3/uL (ref 150–400)
RBC: 3.87 MIL/uL — AB (ref 4.22–5.81)
RDW: 16 % — ABNORMAL HIGH (ref 11.5–15.5)
WBC: 9.1 10*3/uL (ref 4.0–10.5)

## 2013-11-17 LAB — BASIC METABOLIC PANEL
BUN: 45 mg/dL — ABNORMAL HIGH (ref 6–23)
CALCIUM: 9.1 mg/dL (ref 8.4–10.5)
CO2: 24 meq/L (ref 19–32)
CREATININE: 1.64 mg/dL — AB (ref 0.50–1.35)
Chloride: 99 mEq/L (ref 96–112)
GFR calc Af Amer: 44 mL/min — ABNORMAL LOW (ref >90)
GFR calc non Af Amer: 38 mL/min — ABNORMAL LOW (ref >90)
GLUCOSE: 117 mg/dL — AB (ref 70–99)
Potassium: 4.2 mEq/L (ref 3.7–5.3)
Sodium: 137 mEq/L (ref 137–147)

## 2013-11-17 LAB — CARBOXYHEMOGLOBIN
Carboxyhemoglobin: 1.1 % (ref 0.5–1.5)
Methemoglobin: 0.8 % (ref 0.0–1.5)
O2 Saturation: 49.7 %
TOTAL HEMOGLOBIN: 12.7 g/dL — AB (ref 13.5–18.0)

## 2013-11-17 LAB — HEPARIN LEVEL (UNFRACTIONATED): Heparin Unfractionated: 0.58 IU/mL (ref 0.30–0.70)

## 2013-11-17 LAB — PROTIME-INR
INR: 1.5 — ABNORMAL HIGH (ref 0.00–1.49)
Prothrombin Time: 17.7 seconds — ABNORMAL HIGH (ref 11.6–15.2)

## 2013-11-17 MED ORDER — WARFARIN SODIUM 7.5 MG PO TABS
7.5000 mg | ORAL_TABLET | Freq: Once | ORAL | Status: AC
  Administered 2013-11-17: 7.5 mg via ORAL
  Filled 2013-11-17: qty 1

## 2013-11-17 MED ORDER — FUROSEMIDE 10 MG/ML IJ SOLN
40.0000 mg | Freq: Two times a day (BID) | INTRAMUSCULAR | Status: AC
  Administered 2013-11-17 (×2): 40 mg via INTRAVENOUS
  Filled 2013-11-17: qty 4

## 2013-11-17 NOTE — Plan of Care (Signed)
Problem: Phase I Progression Outcomes Goal: Dyspnea controlled at rest (HF) Outcome: Progressing Some shortness of breath with exertion.  Oxygen applied.  Dr. Christ Kick notified.

## 2013-11-17 NOTE — Progress Notes (Addendum)
SUBJECTIVE:  Feels that abdomen is getting bigger.  History obtained through telephone interpreter.  Weight has increased a few pounds as well. Breathing more labored this morning.  OBJECTIVE:   Vitals:   Filed Vitals:   11/16/13 2013 11/17/13 0429 11/17/13 0815 11/17/13 0900  BP: 101/67 114/45 112/52   Pulse: 86 76 78   Temp: 97.5 F (36.4 C) 97.9 F (36.6 C)  97.6 F (36.4 C)  TempSrc: Oral Oral  Oral  Resp: 18     Height:      Weight:  160 lb 8 oz (72.802 kg)    SpO2: 100% 97%  97%   I&O's:    Intake/Output Summary (Last 24 hours) at 11/17/13 1110 Last data filed at 11/17/13 5277  Gross per 24 hour  Intake 2041.65 ml  Output   1850 ml  Net 191.65 ml   TELEMETRY: Reviewed telemetry pt in AFib, paced rhythm:     PHYSICAL EXAM General: Well developed, well nourished, in no acute distress Head:   Normal cephalic and atramatic  Lungs:  Clear bilaterally to auscultation. Heart:  Irregular, S1 S2  No JVD.   Abdomen: abdomen distended, firm Msk:  Back normal,  Normal strength and tone for age. Extremities:  Varicose veins, No edema.   Neuro: Alert and oriented. Psych:  Normal affect, responds appropriately   LABS: Basic Metabolic Panel:  Recent Labs  11/16/13 0300 11/17/13 0354  NA 138 137  K 4.4 4.2  CL 98 99  CO2 22 24  GLUCOSE 101* 117*  BUN 55* 45*  CREATININE 1.77* 1.64*  CALCIUM 9.3 9.1   Liver Function Tests: No results found for this basename: AST, ALT, ALKPHOS, BILITOT, PROT, ALBUMIN,  in the last 72 hours No results found for this basename: LIPASE, AMYLASE,  in the last 72 hours CBC:  Recent Labs  11/16/13 0300 11/17/13 0354  WBC 9.5 9.1  HGB 12.9* 12.1*  HCT 38.0* 36.4*  MCV 93.4 94.1  PLT 264 253   Cardiac Enzymes: No results found for this basename: CKTOTAL, CKMB, CKMBINDEX, TROPONINI,  in the last 72 hours BNP: No components found with this basename: POCBNP,  D-Dimer: No results found for this basename: DDIMER,  in the last  72 hours Hemoglobin A1C: No results found for this basename: HGBA1C,  in the last 72 hours Fasting Lipid Panel: No results found for this basename: CHOL, HDL, LDLCALC, TRIG, CHOLHDL, LDLDIRECT,  in the last 72 hours Thyroid Function Tests: No results found for this basename: TSH, T4TOTAL, FREET3, T3FREE, THYROIDAB,  in the last 72 hours Anemia Panel: No results found for this basename: VITAMINB12, FOLATE, FERRITIN, TIBC, IRON, RETICCTPCT,  in the last 72 hours Coag Panel:   Lab Results  Component Value Date   INR 1.50* 11/17/2013   INR 1.38 11/16/2013   INR 1.20 11/15/2013    RADIOLOGY: Ct Abdomen Pelvis Wo Contrast  11/07/2013   CLINICAL DATA:  Abdominal pain with nausea.  EXAM: CT ABDOMEN AND PELVIS WITHOUT CONTRAST  TECHNIQUE: Multidetector CT imaging of the abdomen and pelvis was performed following the standard protocol without IV contrast.  COMPARISON:  None.  FINDINGS: There is cardiomegaly.  Is a tiny hiatal hernia.  There is suggestion of very tiny subtle stones in the gallbladder. There is no dilatation of the biliary tree in the gallbladder wall is not thickened in the gallbladder is not distended. Liver is normal. There are calcified granulomas in the otherwise normal spleen. Pancreas and adrenal glands are  normal. There are multiple diverticula scattered throughout the colon. The bowel is otherwise normal.  There are multiple low-density lesions in the right kidney, probably cysts, including an 11 mm lesion in the mid right kidney, a 3.8 mm lesion in the lower pole medially and 15 mm lesion laterally in the lower pole. There is a 5 cm cyst in the anterior aspect of the mid left kidney and exophytic 10 mm lesion on the left lower pole.  There is extensive calcification in the abdominal aorta and common iliac arteries. No acute osseous abnormality.  There is a 6.0 x 4.5 x 2.7 cm primarily fatty mass in the retroperitoneum between the upper pole of the right kidney and the upper lumbar spine  and proximal soleus muscle. I suspect this represents a liposarcoma. This was not present on the prior CT scan of 03/28/2004.  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  Possible tiny gallstones.  Probable bilateral renal cysts. This could be confirmed with ultrasound.  Diverticulosis.  No acute diverticulitis.   Electronically Signed   By: Rozetta Nunnery M.D.   On: 11/07/2013 11:15   Dg Chest 2 View  11/07/2013   CLINICAL DATA:  Chest pain, abdominal pain  EXAM: CHEST  2 VIEW  COMPARISON:  10/27/2012  FINDINGS: Cardiomegaly again noted. Three leads cardiac pacemaker is unchanged in position. Left arm PICC line stable in position. No acute infiltrate or pulmonary edema. Mild degenerative changes thoracic spine again noted.  IMPRESSION: Cardiomegaly again noted. No acute infiltrate or pulmonary edema. Stable left arm PICC line position.   Electronically Signed   By: Lahoma Crocker M.D.   On: 11/07/2013 07:00   Ct Biopsy  11/13/2013   CLINICAL DATA:  80 year old with congestive heart failure. Recent CT imaging demonstrated a suspicious fatty lesion in the right retroperitoneum. Tissue diagnosis is needed.  EXAM: CT GUIDED BIOPSY OF THE RIGHT RETROPERITONEAL LESION  Physician: Stephan Minister. Anselm Pancoast, MD  MEDICATIONS: 1 mg versed, 25 mcg fentanyl. A radiology nurse monitored the patient for moderate sedation.  ANESTHESIA/SEDATION: Sedation time: 16 min  PROCEDURE: The procedure was explained to the patient. The risks and benefits of the procedure were discussed and the patient's questions were addressed with an interpreter. Informed consent was obtained from the patient. The patient was placed on his left side because he could not breathe well prone. CT images of the abdomen were obtained. The fatty lesion in the right retroperitoneum was identified. The right side of the back was prepped and draped in sterile fashion. Skin was anesthetized with 1% lidocaine. A 17 gauge needle was directed into the fatty  lesion with CT guidance. Needle position was confirmed along the posterior aspect of the lesion. A total of 4 core biopsies were obtained with an 18 gauge core device. 17 gauge needle was removed without complication. Bandage placed over the puncture site.  FINDINGS: There is a fat attenuating lesion in the right retroperitoneum adjacent to the upper right psoas muscle. Needle position was confirmed along the posterior aspect of the lesion.  COMPLICATIONS: None  IMPRESSION: CT-guided core biopsies of the right retroperitoneal lesion.   Electronically Signed   By: Markus Daft M.D.   On: 11/13/2013 17:40   Dg Chest Port 1 View  11/13/2013   CLINICAL DATA:  Confirm line placement.  EXAM: PORTABLE CHEST - 1 VIEW  COMPARISON:  11/07/2013  FINDINGS: Right PICC line tip is in the SVC. Left pacer is unchanged. Cardiomegaly. Mild vascular congestion without  overt edema. No confluent opacities or effusions. No acute bony abnormality.  IMPRESSION: Right PICC line tip in the SVC.  Cardiomegaly, vascular congestion.   Electronically Signed   By: Rolm Baptise M.D.   On: 11/13/2013 15:27      ASSESSMENT: Kathyrn Lass:   LV dysfunction: Increased abdominal girth. Torsemide not on med list.  Will give 2 doses of IV Lasix today.  CKD: Cr stable.  AFib: INR subtherapeutic.  IV heparin.  TEE DCCV in the future.  RP mass thought to be benign; being sent to [facility] for further review.  ID: Continue Ancef  Dispo: Home health planned for IV ABx.  INR needs to increase.  Jettie Booze, MD  11/17/2013  11:10 AM

## 2013-11-17 NOTE — Progress Notes (Signed)
ANTICOAGULATION/Antibiotic CONSULT NOTE - Follow Up Consult  Pharmacy Consult for Heparin and Coumadin/ cefazolin Indication: atrial fibrillation and hx CVA/ bacteremia  Allergies  Allergen Reactions  . Lisinopril Rash    Patient Measurements: Height: 5\' 7"  (170.2 cm) Weight: 160 lb 8 oz (72.802 kg) (scale a) IBW/kg (Calculated) : 66.1 Heparin Dosing Weight: 72kg  Vital Signs: Temp: 97.4 F (36.3 C) (05/31 1344) Temp src: Axillary (05/31 1344) BP: 103/50 mmHg (05/31 1344) Pulse Rate: 87 (05/31 1344)  Labs:  Recent Labs  11/15/13 0530 11/16/13 0300 11/17/13 0354  HGB 12.5* 12.9* 12.1*  HCT 37.7* 38.0* 36.4*  PLT 254 264 253  LABPROT 14.9 16.6* 17.7*  INR 1.20 1.38 1.50*  HEPARINUNFRC 0.46 0.41 0.58  CREATININE 1.82* 1.77* 1.64*    Estimated Creatinine Clearance: 34.1 ml/min (by C-G formula based on Cr of 1.64).  Assessment: 81yom s/p biopsy of fatty lesion in the right RP resumed on heparin and coumadin for afib and hx CVA. Heparin level is at goal. INR remains below goal, but increasing slowly. Hgb is slightly low, but stable and plt WNL. No bleeding reported. Home dose: 4mg  daily except 6mg  on Friday  Goal of Therapy:  INR 2-3 Heparin level 0.3-0.7 units/ml Monitor platelets by anticoagulation protocol: Yes   Plan:  1) Continue heparin at 1500 units/hr 2) Coumadin 7.5 mg x 1 tonight 3) Heparin level, INR, CBC in AM  Thank you, Vivia Ewing, PharmD Clinical Pharmacist - Resident Pager: [phone] Pharmacy: [phone] 11/17/2013 1:50 PM

## 2013-11-18 DIAGNOSIS — I4891 Unspecified atrial fibrillation: Secondary | ICD-10-CM

## 2013-11-18 DIAGNOSIS — R570 Cardiogenic shock: Secondary | ICD-10-CM

## 2013-11-18 DIAGNOSIS — I5023 Acute on chronic systolic (congestive) heart failure: Secondary | ICD-10-CM

## 2013-11-18 LAB — CBC
HCT: 38.4 % — ABNORMAL LOW (ref 39.0–52.0)
Hemoglobin: 12.5 g/dL — ABNORMAL LOW (ref 13.0–17.0)
MCH: 30.9 pg (ref 26.0–34.0)
MCHC: 32.6 g/dL (ref 30.0–36.0)
MCV: 95 fL (ref 78.0–100.0)
Platelets: 242 10*3/uL (ref 150–400)
RBC: 4.04 MIL/uL — AB (ref 4.22–5.81)
RDW: 16.2 % — ABNORMAL HIGH (ref 11.5–15.5)
WBC: 9 10*3/uL (ref 4.0–10.5)

## 2013-11-18 LAB — CARBOXYHEMOGLOBIN
Carboxyhemoglobin: 1.2 % (ref 0.5–1.5)
Carboxyhemoglobin: 0.8 % (ref 0.5–1.5)
METHEMOGLOBIN: 0.7 % (ref 0.0–1.5)
Methemoglobin: 1.1 % (ref 0.0–1.5)
O2 Saturation: 58.2 %
O2 Saturation: 49.9 %
TOTAL HEMOGLOBIN: 12.5 g/dL — AB (ref 13.5–18.0)
Total hemoglobin: 13.3 g/dL — ABNORMAL LOW (ref 13.5–18.0)

## 2013-11-18 LAB — BASIC METABOLIC PANEL
BUN: 39 mg/dL — ABNORMAL HIGH (ref 6–23)
CO2: 23 meq/L (ref 19–32)
Calcium: 8.8 mg/dL (ref 8.4–10.5)
Chloride: 101 mEq/L (ref 96–112)
Creatinine, Ser: 1.49 mg/dL — ABNORMAL HIGH (ref 0.50–1.35)
GFR calc Af Amer: 50 mL/min — ABNORMAL LOW (ref >90)
GFR, EST NON AFRICAN AMERICAN: 43 mL/min — AB (ref >90)
Glucose, Bld: 112 mg/dL — ABNORMAL HIGH (ref 70–99)
Potassium: 3.8 mEq/L (ref 3.7–5.3)
Sodium: 139 mEq/L (ref 137–147)

## 2013-11-18 LAB — HEPARIN LEVEL (UNFRACTIONATED): HEPARIN UNFRACTIONATED: 0.67 [IU]/mL (ref 0.30–0.70)

## 2013-11-18 LAB — PROTIME-INR
INR: 1.91 — ABNORMAL HIGH (ref 0.00–1.49)
PROTHROMBIN TIME: 21.3 s — AB (ref 11.6–15.2)

## 2013-11-18 MED ORDER — AMIODARONE HCL IN DEXTROSE 360-4.14 MG/200ML-% IV SOLN
60.0000 mg/h | INTRAVENOUS | Status: AC
  Administered 2013-11-18 (×2): 60 mg/h via INTRAVENOUS
  Filled 2013-11-18: qty 200

## 2013-11-18 MED ORDER — WARFARIN SODIUM 6 MG PO TABS
6.0000 mg | ORAL_TABLET | Freq: Once | ORAL | Status: AC
  Administered 2013-11-18: 6 mg via ORAL
  Filled 2013-11-18 (×2): qty 1

## 2013-11-18 MED ORDER — METOLAZONE 2.5 MG PO TABS
2.5000 mg | ORAL_TABLET | Freq: Once | ORAL | Status: AC
  Administered 2013-11-19: 2.5 mg via ORAL
  Filled 2013-11-18: qty 1

## 2013-11-18 MED ORDER — FUROSEMIDE 10 MG/ML IJ SOLN
80.0000 mg | Freq: Two times a day (BID) | INTRAMUSCULAR | Status: DC
  Administered 2013-11-18 – 2013-11-19 (×3): 80 mg via INTRAVENOUS
  Filled 2013-11-18 (×5): qty 8

## 2013-11-18 MED ORDER — ATORVASTATIN CALCIUM 20 MG PO TABS
20.0000 mg | ORAL_TABLET | Freq: Every day | ORAL | Status: DC
  Administered 2013-11-18 – 2013-11-21 (×4): 20 mg via ORAL
  Filled 2013-11-18 (×5): qty 1

## 2013-11-18 MED ORDER — AMIODARONE LOAD VIA INFUSION
150.0000 mg | Freq: Once | INTRAVENOUS | Status: AC
  Administered 2013-11-18: 150 mg via INTRAVENOUS
  Filled 2013-11-18: qty 83.34

## 2013-11-18 MED ORDER — AMIODARONE HCL IN DEXTROSE 360-4.14 MG/200ML-% IV SOLN
30.0000 mg/h | INTRAVENOUS | Status: DC
  Administered 2013-11-18 – 2013-11-19 (×3): 30 mg/h via INTRAVENOUS
  Filled 2013-11-18 (×7): qty 200

## 2013-11-18 MED ORDER — MILRINONE IN DEXTROSE 20 MG/100ML IV SOLN
0.3750 ug/kg/min | INTRAVENOUS | Status: DC
  Administered 2013-11-18 – 2013-11-22 (×8): 0.375 ug/kg/min via INTRAVENOUS
  Filled 2013-11-18 (×7): qty 100

## 2013-11-18 NOTE — Progress Notes (Signed)
Subjective:   Got 2 doses of IV lasix yesterday. Weight down 3 pounds.   Remains SOB with exertion. + Orthopnea. + PND.   Feels better. Renal function stable. Co-ox improved 50%->58%. INR 1.9  Objective:   Scheduled Meds: . carvedilol  3.125 mg Oral BID WC  .  ceFAZolin (ANCEF) IV  1 g Intravenous Q12H  . simvastatin  40 mg Oral q1800  . Tadalafil (PAH)  40 mg Oral Daily  . Warfarin - Pharmacist Dosing Inpatient   Does not apply q1800   Continuous Infusions: . sodium chloride 20 mL/hr at 11/11/13 2332  . heparin 1,500 Units/hr (11/18/13 0300)  . milrinone 0.25 mcg/kg/min (11/18/13 0719)   PRN Meds:.acetaminophen, ondansetron (ZOFRAN) IV, sodium chloride  Vital Signs in the last 24 hours: BP 119/64  Pulse 78  Temp(Src) 97.6 F (36.4 C) (Oral)  Resp 18  Ht 5\' 7"  (1.702 m)  Wt 71.396 kg (157 lb 6.4 oz)  BMI 24.65 kg/m2  SpO2 100%  Physical Exam: Sitting on side of bed. no acute distress Neck: JVP flat Lungs:  Clear  Cardiac:  Irregular rhythm, normal S1 and S2, no S3 Abdomen:  Mildly distended. Nontender. Good BS Extremities:  No edema present Warm Neuro: non focal.   Telemetery: Vpaced, afib  Intake/Output from previous day: 05/31 0701 - 06/01 0700 In: 1895.3 [P.O.:600; I.V.:1195.3; IV Piggyback:100] Out: 3101 [Urine:3100; Stool:1] Weight Filed Weights   11/16/13 0454 11/17/13 0429 11/18/13 0515  Weight: 70.88 kg (156 lb 4.2 oz) 72.802 kg (160 lb 8 oz) 71.396 kg (157 lb 6.4 oz)    Lab Results: Basic Metabolic Panel:  Recent Labs  11/17/13 0354 11/18/13 0547  NA 137 139  K 4.2 3.8  CL 99 101  CO2 24 23  GLUCOSE 117* 112*  BUN 45* 39*  CREATININE 1.64* 1.49*    CBC:  Recent Labs  11/17/13 0354 11/18/13 0547  WBC 9.1 9.0  HGB 12.1* 12.5*  HCT 36.4* 38.4*  MCV 94.1 95.0  PLT 253 242    BNP    Component Value Date/Time   PROBNP 9356.0* 11/07/2013 0708    PROTIME: Lab Results  Component Value Date   INR 1.91* 11/18/2013   INR 1.50*  11/17/2013   INR 1.38 11/16/2013    Telemetry: Paced rhythm  Assessment/Plan:  1. Chronic systolic heart failure on home milrinone 2. Pulmonary hypertension 3. Staph sepsis 4. Indwelling implantable defibrillator and previous PICC line 5. Atrial fibrillation 6. Chronic anticoagulation 7. 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 Chronic kidney disease 9. Hypokalemia   Recommendations:  Volume status mildy elevated. Continue IV lasix today. Remains in A fib. Load Amiodarone. On coumadin. Once we get plan for his RP mass then we can decide about timing of TEE/DC-CV (once we cardiovert he needs to be on uninterrupted anticoagulation for at least 4 weeks).   Per pathologist (Dr. Claudette Laws) possilbe fat necrosis and not liposarcoma. However, lab wants to send biopsy to [facility] for molecular staining. Results will be back probably midweek.  If fat necrosis then will perform DC-CV. If malignant will need to defer DC-CV until we come up with a plan from Oncology standpoint. Continue IV abx for staph bacteremia - duration per ID:  Treat with ancef alone  Would treat him 2 weeks from date of PIC removal (5-22 ---> 6-5) with TEE (-)  - will arrange with [facility] Would repeat his BCx 1 week after he finishes anbx (due to presence of retained hardware/ICD) [name] D Clegg NP-C  9:52 AM   Patient seen and examined with Darrick Grinder, NP. We discussed all aspects of the encounter. I agree with the assessment and plan as stated above. Discussed with patient personally using Dr. Olevia Bowens as interpreter.  Continue IV lasix. Load amio. Await path on biopsy of RP mass.    Shaune Pascal Bensimhon,MD 8:17 AM

## 2013-11-18 NOTE — Progress Notes (Signed)
UR completed Blimie Vaness K. Shelvy Heckert, RN, BSN, [place], CCM  11/18/2013 12:11 PM

## 2013-11-18 NOTE — Progress Notes (Addendum)
  Patient more SOB. Poor urine output with IV lasix. BP currently stable.  Will move to SDU to measure co-ox and CVP.   Tyler Pascal Yarexi Pawlicki,MD 6:37 PM  Patient transferred to [facility]. Feeling a bit better.   [name] ~90. CVP 23. Co-ox 49%   Will increase milrinone to 0.375. Restart IV lasix and metolazone. He is clearly not tolerating AF. Will need TEE and DC-CV prior to discharge. Continue amio and heparin/coumadin.   The patient is critically ill with multiple organ systems failure and requires high complexity decision making for assessment and support, frequent evaluation and titration of therapies, application of advanced monitoring technologies and extensive interpretation of multiple databases.   Critical Care Time devoted to patient care services described in this note and throughout afternoon is 55 Minutes.  Tyler Pascal Yael Coppess,MD 7:59 PM

## 2013-11-18 NOTE — Progress Notes (Signed)
Called interpreter for translation of spanish to [place]. Pt states is having shortness of breath and wants Dr. Haroldine Laws called.  Pt having some shortness of breath but after speaking with translator, Pt was sitting on the side of the bed and noticed shortness of breath had increased. Notified [name] Clegg,NP. Both [name] and Dr. Haroldine Laws arrived at patient's bedside. Orders given to transfer patient to stepdown. Currently bed is pending and will transfer when available. Will continue to monitor patient to end of shift.

## 2013-11-18 NOTE — Progress Notes (Signed)
Report given to Nurse Kieth Brightly on [facility]. Patient transferred to room 2H19.

## 2013-11-18 NOTE — Progress Notes (Signed)
ANTICOAGULATION/Antibiotic CONSULT NOTE - Follow Up Consult  Pharmacy Consult for Heparin and Coumadin/ cefazolin Indication: atrial fibrillation and hx CVA/ bacteremia  Patient Measurements: Height: 5\' 7"  (170.2 cm) Weight: 157 lb 6.4 oz (71.396 kg) (a scale) IBW/kg (Calculated) : 66.1 Heparin Dosing Weight: 72kg  Vital Signs: Temp: 97.3 F (36.3 C) (06/01 1113) Temp src: Oral (06/01 1113) BP: 133/82 mmHg (06/01 1300) Pulse Rate: 101 (06/01 1300)  Labs:  Recent Labs  11/16/13 0300 11/17/13 0354 11/18/13 0547  HGB 12.9* 12.1* 12.5*  HCT 38.0* 36.4* 38.4*  PLT 264 253 242  LABPROT 16.6* 17.7* 21.3*  INR 1.38 1.50* 1.91*  HEPARINUNFRC 0.41 0.58 0.67  CREATININE 1.77* 1.64* 1.49*    Estimated Creatinine Clearance: 37.6 ml/min (by C-G formula based on Cr of 1.49).  Assessment: 81yom s/p biopsy of fatty lesion in the right RP resumed on heparin and coumadin for afib and hx CVA. Heparin level is at goal. INR remains just below goal, but trending up. Hgb is slightly low, but stable and plt WNL. No bleeding reported. Home dose: 4mg  daily except 6mg  on Friday  Goal of Therapy:  INR 2-3 Heparin level 0.3-0.7 units/ml Monitor platelets by anticoagulation protocol: Yes   Plan:  1) Continue heparin at 1500 units/hr 2) Coumadin 6 mg x 1 tonight 3) Heparin level, INR, CBC in AM  Thank you, Erin Hearing PharmD., BCPS Clinical Pharmacist Pager [phone] 11/18/2013 1:09 PM

## 2013-11-19 ENCOUNTER — Inpatient Hospital Stay (HOSPITAL_COMMUNITY): Payer: Medicare Other | Admitting: Anesthesiology

## 2013-11-19 ENCOUNTER — Encounter (HOSPITAL_COMMUNITY)
Admission: EM | Disposition: A | Discharge status: Home / Self Care | Payer: Self-pay | Source: Home / Self Care | Attending: Internal Medicine

## 2013-11-19 ENCOUNTER — Encounter (HOSPITAL_COMMUNITY): Payer: Medicare Other | Admitting: Anesthesiology

## 2013-11-19 ENCOUNTER — Encounter: Payer: Self-pay | Admitting: Internal Medicine

## 2013-11-19 ENCOUNTER — Encounter (HOSPITAL_COMMUNITY): Payer: Self-pay | Admitting: *Deleted

## 2013-11-19 DIAGNOSIS — I059 Rheumatic mitral valve disease, unspecified: Secondary | ICD-10-CM

## 2013-11-19 HISTORY — PX: TEE WITHOUT CARDIOVERSION: SHX5443

## 2013-11-19 LAB — BASIC METABOLIC PANEL
BUN: 47 mg/dL — ABNORMAL HIGH (ref 6–23)
CO2: 24 meq/L (ref 19–32)
CREATININE: 1.85 mg/dL — AB (ref 0.50–1.35)
Calcium: 9.2 mg/dL (ref 8.4–10.5)
Chloride: 96 mEq/L (ref 96–112)
GFR calc Af Amer: 38 mL/min — ABNORMAL LOW (ref >90)
GFR calc non Af Amer: 33 mL/min — ABNORMAL LOW (ref >90)
GLUCOSE: 116 mg/dL — AB (ref 70–99)
Potassium: 3.7 mEq/L (ref 3.7–5.3)
Sodium: 137 mEq/L (ref 137–147)

## 2013-11-19 LAB — CBC
HEMATOCRIT: 35.8 % — AB (ref 39.0–52.0)
HEMOGLOBIN: 12.2 g/dL — AB (ref 13.0–17.0)
MCH: 31.8 pg (ref 26.0–34.0)
MCHC: 34.1 g/dL (ref 30.0–36.0)
MCV: 93.2 fL (ref 78.0–100.0)
Platelets: 234 10*3/uL (ref 150–400)
RBC: 3.84 MIL/uL — ABNORMAL LOW (ref 4.22–5.81)
RDW: 16.3 % — ABNORMAL HIGH (ref 11.5–15.5)
WBC: 10.6 10*3/uL — ABNORMAL HIGH (ref 4.0–10.5)

## 2013-11-19 LAB — CARBOXYHEMOGLOBIN
Carboxyhemoglobin: 1.2 % (ref 0.5–1.5)
METHEMOGLOBIN: 0.9 % (ref 0.0–1.5)
O2 Saturation: 66.4 %
Total hemoglobin: 12.6 g/dL — ABNORMAL LOW (ref 13.5–18.0)

## 2013-11-19 LAB — HEPARIN LEVEL (UNFRACTIONATED): Heparin Unfractionated: 0.85 IU/mL — ABNORMAL HIGH (ref 0.30–0.70)

## 2013-11-19 LAB — PROTIME-INR
INR: 2.69 — ABNORMAL HIGH (ref 0.00–1.49)
Prothrombin Time: 27.7 seconds — ABNORMAL HIGH (ref 11.6–15.2)

## 2013-11-19 SURGERY — ECHOCARDIOGRAM, TRANSESOPHAGEAL
Anesthesia: Monitor Anesthesia Care

## 2013-11-19 MED ORDER — SODIUM CHLORIDE 0.9 % IV SOLN
INTRAVENOUS | Status: DC | PRN
  Administered 2013-11-19: 13:00:00 via INTRAVENOUS

## 2013-11-19 MED ORDER — BUTAMBEN-TETRACAINE-BENZOCAINE 2-2-14 % EX AERO
INHALATION_SPRAY | CUTANEOUS | Status: DC | PRN
  Administered 2013-11-19: 2 via TOPICAL

## 2013-11-19 MED ORDER — PROPOFOL INFUSION 10 MG/ML OPTIME
INTRAVENOUS | Status: DC | PRN
  Administered 2013-11-19: 60 ug/kg/min via INTRAVENOUS

## 2013-11-19 MED ORDER — WARFARIN SODIUM 2 MG PO TABS
2.0000 mg | ORAL_TABLET | Freq: Once | ORAL | Status: AC
  Administered 2013-11-19: 2 mg via ORAL
  Filled 2013-11-19: qty 1

## 2013-11-19 NOTE — H&P (View-Only) (Signed)
  Patient more SOB. Poor urine output with IV lasix. BP currently stable.  Will move to SDU to measure co-ox and CVP.   Avarose Mervine R Uva Runkel,MD 6:37 PM  Patient transferred to 2H. Feeling a bit better.   [name] ~90. CVP 23. Co-ox 49%   Will increase milrinone to 0.375. Restart IV lasix and metolazone. He is clearly not tolerating AF. Will need TEE and DC-CV prior to discharge. Continue amio and heparin/coumadin.   The patient is critically ill with multiple organ systems failure and requires high complexity decision making for assessment and support, frequent evaluation and titration of therapies, application of advanced monitoring technologies and extensive interpretation of multiple databases.   Critical Care Time devoted to patient care services described in this note and throughout afternoon is 55 Minutes.  Barton Want R Larenda Reedy,MD 7:59 PM     

## 2013-11-19 NOTE — Progress Notes (Signed)
Subjective:   Yesterday he had developed acute dyspnea at rest. He was transferred to stepdown. He started on amiodarone drip and Milrinone was increased to 0.375 mcg. CVP at that time was 23 and he received 80 mg IV lasix and metolazone.  Weight down 3 pounds.   Denies SOB feeling better.   COo-ox improved 50%->58>49> 66% on increased Milrinone at 0.375 mcg  INR 2.6   Objective:   Scheduled Meds: . atorvastatin  20 mg Oral q1800  .  ceFAZolin (ANCEF) IV  1 g Intravenous Q12H  . furosemide  80 mg Intravenous BID  . Tadalafil (PAH)  40 mg Oral Daily  . Warfarin - Pharmacist Dosing Inpatient   Does not apply q1800   Continuous Infusions: . amiodarone 30 mg/hr (11/18/13 2335)  . milrinone 0.375 mcg/kg/min (11/19/13 0028)   PRN Meds:.acetaminophen, ondansetron (ZOFRAN) IV, sodium chloride  Vital Signs in the last 24 hours: BP 93/53  Pulse 77  Temp(Src) 97.3 F (36.3 C) (Oral)  Resp 23  Ht 5\' 7"  (1.702 m)  Wt 157 lb 6.4 oz (71.396 kg)  BMI 24.65 kg/m2  SpO2 96%  Physical Exam: CVP ~10  Sitting on side of bed. no acute distress Neck: JVP ~10 Lungs:  Clear  Cardiac:  Irregular rhythm, normal S1 and S2, no S3 Abdomen:  ++ distended. Nontender. Good BS Extremities:  No edema present Warm Neuro: non focal.   Telemetery: Vpaced, afib 80s   Intake/Output from previous day: 06/01 0701 - 06/02 0700 In: 924.7 [P.O.:480; I.V.:444.7] Out: 1300 [Urine:1300] Weight Filed Weights   11/16/13 0454 11/17/13 0429 11/18/13 0515  Weight: 156 lb 4.2 oz (70.88 kg) 160 lb 8 oz (72.802 kg) 157 lb 6.4 oz (71.396 kg)    Lab Results: Basic Metabolic Panel:  Recent Labs  11/18/13 0547 11/19/13 0612  NA 139 137  K 3.8 3.7  CL 101 96  CO2 23 24  GLUCOSE 112* 116*  BUN 39* 47*  CREATININE 1.49* 1.85*    CBC:  Recent Labs  11/18/13 0547 11/19/13 0612  WBC 9.0 10.6*  HGB 12.5* 12.2*  HCT 38.4* 35.8*  MCV 95.0 93.2  PLT 242 234    BNP    Component Value Date/Time   PROBNP 9356.0* 11/07/2013 0708    PROTIME: Lab Results  Component Value Date   INR 2.69* 11/19/2013   INR 1.91* 11/18/2013   INR 1.50* 11/17/2013    Telemetry: Paced rhythm  Assessment/Plan:  1. Chronic systolic heart failure on home milrinone 2. Pulmonary hypertension 3. Staph sepsis 4. Indwelling implantable defibrillator and previous PICC line 5. Atrial fibrillation 6. Chronic anticoagulation 7. 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 Chronic kidney disease 9. Hypokalemia   Recommendations:  CO-OX 66% on Milrinone 0.375 mcg. Volume status improving. Weight down 3 pounds. Renal function trending up. Watch closely.   Remains in A fib. Load Amiodarone. On coumadin. INR per pharmacy. Will need TEE/DC/CV today.   Per pathologist (Dr. Claudette Laws) possilbe fat necrosis and not liposarcoma. However, lab wants to send biopsy to [facility] for molecular staining. Results will be back probably midweek.   Treating  with ancef alone for coag negative staph.  Will treat him 2 weeks from date of PIC removal (5-22 ---> 6-5) with TEE (-)  - will arrange with [facility] Would repeat his BCx 1 week after he finishes anbx (due to presence of retained hardware/ICD) [name] D Clegg NP-C  8:15 AM   Patient seen and examined with Darrick Grinder, NP. We discussed  all aspects of the encounter. I agree with the assessment and plan as stated above.   Feels much better on higher dose milrinone. His HF has been much more tenuous over the past month since being back in AF. Will proceed with attempt at TEE/DC-CV today. Continue to follow co-ox and CVP. Continue abx. Await path on retroperitoneal mass.   Shaune Pascal Bensimhon,MD 5:04 PM

## 2013-11-19 NOTE — Transfer of Care (Signed)
Immediate Anesthesia Transfer of Care Note  Patient: Tyler Christensen  Procedure(s) Performed: Procedure(s): TRANSESOPHAGEAL ECHOCARDIOGRAM (TEE) (N/A)  Patient Location: PACU and Endoscopy Unit  Anesthesia Type:MAC  Level of Consciousness: awake, alert , oriented and patient cooperative  Airway & Oxygen Therapy: Patient Spontanous Breathing and Patient connected to nasal cannula oxygen  Post-op Assessment: Report given to PACU RN, Post -op Vital signs reviewed and stable and Patient moving all extremities  Post vital signs: Reviewed and stable  Complications: No apparent anesthesia complications

## 2013-11-19 NOTE — Anesthesia Preprocedure Evaluation (Addendum)
Anesthesia Evaluation  Patient identified by MRN, date of birth, ID band Patient awake    Reviewed: Allergy & Precautions, H&P , NPO status , Patient's Chart, lab work & pertinent test results  History of Anesthesia Complications Negative for: history of anesthetic complications  Airway Mallampati: I  Neck ROM: Full    Dental  (+) Edentulous Upper, Edentulous Lower   Pulmonary neg pulmonary ROS,    Pulmonary exam normal       Cardiovascular hypertension, + Past MI, + Peripheral Vascular Disease and +CHF + dysrhythmias Atrial Fibrillation Rhythm:Irregular     Neuro/Psych Seizures -,  CVA    GI/Hepatic negative GI ROS, Neg liver ROS,   Endo/Other  negative endocrine ROS  Renal/GU negative Renal ROS     Musculoskeletal   Abdominal   Peds  Hematology   Anesthesia Other Findings   Reproductive/Obstetrics                          Anesthesia Physical Anesthesia Plan  ASA: III  Anesthesia Plan: MAC   Post-op Pain Management:    Induction: Intravenous  Airway Management Planned: Simple Face Mask  Additional Equipment:   Intra-op Plan:   Post-operative Plan:   Informed Consent: I have reviewed the patients History and Physical, chart, labs and discussed the procedure including the risks, benefits and alternatives for the proposed anesthesia with the patient or authorized representative who has indicated his/her understanding and acceptance.     Plan Discussed with: CRNA, Anesthesiologist and Surgeon  Anesthesia Plan Comments: (Interpreter was used for the interview)       Anesthesia Quick Evaluation

## 2013-11-19 NOTE — Interval H&P Note (Signed)
History and Physical Interval Note:  11/19/2013 1:31 PM  Tyler Christensen  has presented today for surgery, with the diagnosis of a fib  The various methods of treatment have been discussed with the patient and family. After consideration of risks, benefits and other options for treatment, the patient has consented to  Procedure(s): TRANSESOPHAGEAL ECHOCARDIOGRAM (TEE) (N/A) CARDIOVERSION (N/A) as a surgical intervention .  The patient's history has been reviewed, patient examined, no change in status, stable for surgery.  I have reviewed the patient's chart and labs.  Questions were answered to the patient's satisfaction.     Tyler Christensen

## 2013-11-19 NOTE — CV Procedure (Signed)
    TRANSESOPHAGEAL ECHOCARDIOGRAM   NAME:  Tyler Christensen   MRN: [id number] DOB:  07/16/1932   ADMIT DATE: 11/07/2013  INDICATIONS: Atrial fib   PROCEDURE:   Informed consent was obtained prior to the procedure. The risks, benefits and alternatives for the procedure were discussed through an interpreter and the patient comprehended these risks.  Risks include, but are not limited to, cough, sore throat, vomiting, nausea, somnolence, esophageal and stomach trauma or perforation, bleeding, Christensen blood pressure, aspiration, pneumonia, infection, trauma to the teeth and death.    After a procedural time-out, the patient was sedated with IV propofol by the anesthesia service. The transesophageal probe was inserted in the esophagus and stomach without difficulty and multiple views were obtained.    COMPLICATIONS:    There were no immediate complications.  FINDINGS:  LEFT VENTRICLE: Dilated. Severe global HK EF = 10-15%.   RIGHT VENTRICLE: Dilated. Moderate hypokinesis  LEFT ATRIUM: Markedly dilated 6.7 cm  LEFT ATRIAL APPENDAGE: Very heavy smoke filling LAA. With thick early laminated thrombus on back wall.   RIGHT ATRIUM: Mildly dialted  AORTIC VALVE:  Trileaflet. No AI/AS  MITRAL VALVE:    Normal. Mild MR  TRICUSPID VALVE: Normal. Mild TR  PULMONIC VALVE: Not well visulaized  INTERATRIAL SEPTUM: No PFO or ASD.  PERICARDIUM: No effusion  DESCENDING AORTA: Moderate diffuse plaque   CONCLUSION:  Not candidate for DC-CV due to heavy LAA smoke/clot.  Shaune Pascal Kaydan Wong,MD 1:42 PM

## 2013-11-19 NOTE — Anesthesia Postprocedure Evaluation (Signed)
  Anesthesia Post-op Note  Patient: Tyler Christensen  Procedure(s) Performed: Procedure(s): TRANSESOPHAGEAL ECHOCARDIOGRAM (TEE) (N/A)  Patient Location: Endoscopy Unit  Anesthesia Type:MAC  Level of Consciousness: awake, alert , oriented and patient cooperative  Airway and Oxygen Therapy: Patient Spontanous Breathing and Patient connected to nasal cannula oxygen  Post-op Pain: none  Post-op Assessment: Post-op Vital signs reviewed, Patient's Cardiovascular Status Stable, Respiratory Function Stable, Patent Airway and No signs of Nausea or vomiting  Post-op Vital Signs: Reviewed and stable  Last Vitals:  Filed Vitals:   11/19/13 1234  BP: 105/65  Pulse:   Temp:   Resp: 23    Complications: No apparent anesthesia complications

## 2013-11-19 NOTE — Progress Notes (Addendum)
ANTICOAGULATION/Antibiotic CONSULT NOTE - Follow Up Consult  Pharmacy Consult for Heparin and Coumadin/ cefazolin Indication: atrial fibrillation and hx CVA/ bacteremia  Patient Measurements: Height: 5\' 7"  (170.2 cm) Weight: 157 lb 6.4 oz (71.396 kg) (a scale) IBW/kg (Calculated) : 66.1 Heparin Dosing Weight: 72kg  Vital Signs: Temp: 97.3 F (36.3 C) (06/02 0700) Temp src: Oral (06/02 0700) BP: 93/53 mmHg (06/02 0730) Pulse Rate: 77 (06/02 0730)  Labs:  Recent Labs  11/17/13 0354 11/18/13 0547 11/19/13 0612  HGB 12.1* 12.5* 12.2*  HCT 36.4* 38.4* 35.8*  PLT 253 242 234  LABPROT 17.7* 21.3* 27.7*  INR 1.50* 1.91* 2.69*  HEPARINUNFRC 0.58 0.67 0.85*  CREATININE 1.64* 1.49* 1.85*    Estimated Creatinine Clearance: 30.3 ml/min (by C-G formula based on Cr of 1.85).  Assessment: 81yom admitted 11/07/2013  s/p biopsy of fatty lesion in the right RP pharmacy consulted to bridge with heparin and resume coumadin for afib and hx CVA.   Events 6.2, INR at goal,   PMH: HF, NICM, LBBB, PAF, HTN, HLD, CVA, pulm HTN, seizures  Coag: afib, INR at goal No bleeding reported. Home dose: 4mg  daily except 6mg  on Friday (INR last clinic visit 1.6)  ID:  line sepsis, PICC removde: afebrile, WBC 9.1, UA ok, bacteremia, TEE neg veg - per ID ok to replace PICC 5/27  5/21 blood x3>> 2/2 gpc in clusters (coag neg staph species) S oxacillin 5/21 urine>>mult bacteria   5/22 Vanc>>5/24 5/22 Cefazolin>>(6/5 per ID)  Cards: EF 15%, admit w/ abdominal bloating/inc dyspnea, planning RHC prior to D/C - (torsemide pta),  ON:  milrinone 0.375 ( replace PICC 5/27), amio, atorva, furo 80 iv bid, tadalafil 40 daily hr 60-70s [name] 90s   PTA Medication Issues:. Home medications not ordered:   losartan, metolazone,spiro, coreg, torsemide  Best Practices: coumadin > hep  Goal of Therapy:  INR 2-3   Plan:  - Continue cefazolin 1g IV q12h until 6/5 2 weeks from PICC line removal - per ID - DC  heparin - Coumadin to 2 mg x 1 - Daily INR  Thank you for allowing pharmacy to be a part of this patients care team.  Rowe Robert Pharm.D., BCPS, AQ-Cardiology Clinical Pharmacist 11/19/2013 8:09 AM Pager: [phone] Phone: [phone]

## 2013-11-20 DIAGNOSIS — R1900 Intra-abdominal and pelvic swelling, mass and lump, unspecified site: Secondary | ICD-10-CM

## 2013-11-20 DIAGNOSIS — A4901 Methicillin susceptible Staphylococcus aureus infection, unspecified site: Secondary | ICD-10-CM

## 2013-11-20 DIAGNOSIS — R7881 Bacteremia: Secondary | ICD-10-CM

## 2013-11-20 LAB — BASIC METABOLIC PANEL
BUN: 47 mg/dL — ABNORMAL HIGH (ref 6–23)
CHLORIDE: 95 meq/L — AB (ref 96–112)
CO2: 27 mEq/L (ref 19–32)
Calcium: 9.4 mg/dL (ref 8.4–10.5)
Creatinine, Ser: 1.86 mg/dL — ABNORMAL HIGH (ref 0.50–1.35)
GFR calc Af Amer: 38 mL/min — ABNORMAL LOW (ref >90)
GFR, EST NON AFRICAN AMERICAN: 33 mL/min — AB (ref >90)
Glucose, Bld: 105 mg/dL — ABNORMAL HIGH (ref 70–99)
POTASSIUM: 3.6 meq/L — AB (ref 3.7–5.3)
SODIUM: 137 meq/L (ref 137–147)

## 2013-11-20 LAB — CBC
HEMATOCRIT: 36.8 % — AB (ref 39.0–52.0)
Hemoglobin: 12.6 g/dL — ABNORMAL LOW (ref 13.0–17.0)
MCH: 31.7 pg (ref 26.0–34.0)
MCHC: 34.2 g/dL (ref 30.0–36.0)
MCV: 92.7 fL (ref 78.0–100.0)
PLATELETS: 228 10*3/uL (ref 150–400)
RBC: 3.97 MIL/uL — ABNORMAL LOW (ref 4.22–5.81)
RDW: 15.8 % — AB (ref 11.5–15.5)
WBC: 10.7 10*3/uL — AB (ref 4.0–10.5)

## 2013-11-20 LAB — CARBOXYHEMOGLOBIN
CARBOXYHEMOGLOBIN: 1.2 % (ref 0.5–1.5)
METHEMOGLOBIN: 0.8 % (ref 0.0–1.5)
O2 Saturation: 65 %
Total hemoglobin: 12.9 g/dL — ABNORMAL LOW (ref 13.5–18.0)

## 2013-11-20 LAB — PROTIME-INR
INR: 2.91 — AB (ref 0.00–1.49)
PROTHROMBIN TIME: 29.4 s — AB (ref 11.6–15.2)

## 2013-11-20 MED ORDER — AMIODARONE HCL 200 MG PO TABS
200.0000 mg | ORAL_TABLET | Freq: Two times a day (BID) | ORAL | Status: DC
  Administered 2013-11-20 – 2013-11-22 (×5): 200 mg via ORAL
  Filled 2013-11-20 (×6): qty 1

## 2013-11-20 MED ORDER — WARFARIN SODIUM 1 MG PO TABS
1.0000 mg | ORAL_TABLET | Freq: Once | ORAL | Status: AC
  Administered 2013-11-20: 1 mg via ORAL
  Filled 2013-11-20: qty 1

## 2013-11-20 MED ORDER — POTASSIUM CHLORIDE CRYS ER 20 MEQ PO TBCR
40.0000 meq | EXTENDED_RELEASE_TABLET | Freq: Once | ORAL | Status: AC
  Administered 2013-11-20: 40 meq via ORAL
  Filled 2013-11-20: qty 2

## 2013-11-20 NOTE — Progress Notes (Signed)
ANTICOAGULATION/Antibiotic CONSULT NOTE - Follow Up Consult  Pharmacy Consult for Heparin and Coumadin/ cefazolin Indication: atrial fibrillation and hx CVA/ bacteremia  Patient Measurements: Height: 5\' 7"  (170.2 cm) Weight: 159 lb 9.6 oz (72.394 kg) IBW/kg (Calculated) : 66.1 Heparin Dosing Weight: 72kg  Vital Signs: Temp: 97.7 F (36.5 C) (06/03 0730) Temp src: Oral (06/03 0730) BP: 112/73 mmHg (06/03 0730) Pulse Rate: 82 (06/03 0730)  Labs:  Recent Labs  11/18/13 0547 11/19/13 0612 11/20/13 0630  HGB 12.5* 12.2* 12.6*  HCT 38.4* 35.8* 36.8*  PLT 242 234 228  LABPROT 21.3* 27.7* 29.4*  INR 1.91* 2.69* 2.91*  HEPARINUNFRC 0.67 0.85*  --   CREATININE 1.49* 1.85* 1.86*    Estimated Creatinine Clearance: 30.1 ml/min (by C-G formula based on Cr of 1.86).  Assessment: 81yom admitted 11/07/2013  s/p biopsy of fatty lesion in the right RP pharmacy consulted to bridge with heparin and resume coumadin for afib and hx CVA.   Events 6.3, INR at goal but at upper end 2.9  PMH: HF, NICM, LBBB, PAF, HTN, HLD, CVA, pulm HTN, seizures  Coag: afib, INR at goal No bleeding reported. Home dose: 4mg  daily except 6mg  on Friday (INR last clinic visit 1.6)  ID:  line sepsis, PICC removde: afebrile, WBC 10(up), UA ok, bacteremia, TEE neg veg - per ID ok to replace PICC 5/27  5/21 blood x3>> 2/2 gpc in clusters (coag neg staph species) S oxacillin 5/21 urine>>mult bacteria   5/22 Vanc>>5/24 5/22 Cefazolin>>(6/5 per ID)  Cards:EF 15%, admit w/ abdominal bloating/inc dyspnea, planning RHC prior to D/C - (torsemide pta),  ON: milrinone 0.375 ( replace PICC 5/27), amio, atorva, furo 80 iv bid, tadalafil 40 daily hr 80s [name] 80-110s, coox 65, cvp 5, wt up 2lbs 159 -awaiting patho from [place] [facility] (possible tissue necrosis), TEE shows smoke clot in LAA - DCCV not possible  PTA Medication Issues:. Home medications not ordered:   losartan, metolazone,spiro, coreg, torsemide  Best  Practices: coumadin   Goal of Therapy:  INR 2-3   Plan:  - Continue cefazolin 1g IV q12h until 6/5 2 weeks from PICC line removal - per ID - off heparin - Coumadin 1mg  - Daily INR  Thank you for allowing pharmacy to be a part of this patients care team.  Erin Hearing PharmD., BCPS Clinical Pharmacist Pager [phone] 11/20/2013 8:07 AM

## 2013-11-20 NOTE — Progress Notes (Signed)
Subjective:   Yesterday he had TEE with plan for DC-CV but this aborted due to heavy smoke and clot. Amiodarone stopped. Weight up 2 pounds on lasix 80 mg bid. CVP down to 5.   Denies SOB.    CO-OX 65% on  Milrinone at 0.375 mcg  INR 2.9  Objective:   Scheduled Meds: . atorvastatin  20 mg Oral q1800  .  ceFAZolin (ANCEF) IV  1 g Intravenous Q12H  . furosemide  80 mg Intravenous BID  . Tadalafil (PAH)  40 mg Oral Daily  . Warfarin - Pharmacist Dosing Inpatient   Does not apply q1800   Continuous Infusions: . milrinone 0.375 mcg/kg/min (11/20/13 0133)   PRN Meds:.acetaminophen, ondansetron (ZOFRAN) IV, sodium chloride  Vital Signs in the last 24 hours: BP 112/73  Pulse 82  Temp(Src) 97.7 F (36.5 C) (Oral)  Resp 18  Ht 5\' 7"  (1.702 m)  Wt 159 lb 9.6 oz (72.394 kg)  BMI 24.99 kg/m2  SpO2 98%  Physical Exam: CVP ~5 Sitting on side of bed. no acute distress Neck: JVP flat Lungs:  Clear  Cardiac:  Irregular rhythm, normal S1 and S2, no S3 Abdomen:  ++ distended. Nontender. Good BS Extremities:  No edema present Warm Neuro: non focal.   Telemetery: Vpaced, afib 80s   Intake/Output from previous day: 06/02 0701 - 06/03 0700 In: 785.1 [I.V.:585.1; IV Piggyback:200] Out: 2050 [Urine:2050] Weight Filed Weights   11/18/13 0515 11/19/13 0800 11/20/13 0300  Weight: 157 lb 6.4 oz (71.396 kg) 157 lb 3 oz (71.3 kg) 159 lb 9.6 oz (72.394 kg)    Lab Results: Basic Metabolic Panel:  Recent Labs  11/19/13 0612 11/20/13 0630  NA 137 137  K 3.7 3.6*  CL 96 95*  CO2 24 27  GLUCOSE 116* 105*  BUN 47* 47*  CREATININE 1.85* 1.86*    CBC:  Recent Labs  11/19/13 0612 11/20/13 0630  WBC 10.6* 10.7*  HGB 12.2* 12.6*  HCT 35.8* 36.8*  MCV 93.2 92.7  PLT 234 228    BNP    Component Value Date/Time   PROBNP 9356.0* 11/07/2013 0708    PROTIME: Lab Results  Component Value Date   INR 2.91* 11/20/2013   INR 2.69* 11/19/2013   INR 1.91* 11/18/2013     Telemetry: Paced rhythm  Assessment/Plan:  1. Chronic systolic heart failure on home milrinone 2. Pulmonary hypertension 3. Staph sepsis 4. Indwelling implantable defibrillator and previous PICC line 5. Atrial fibrillation 6. Chronic anticoagulation 7. 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 Chronic kidney disease 9. Hypokalemia  10. LAA clot   Recommendations: CO-OX 65% on Milrinone 0.375 mcg. Volume status ok. CVP 5.  Stop IV lasix. Renal function remains elevated.    Unable to cardiovert due heavy LAA smoke and clot noted. Remains in A fib which is complicating his care.   Per pathologist (Dr. Claudette Laws) possilbe fat necrosis and not liposarcoma. However, lab wants to send biopsy to [facility] for molecular staining. Results will be back probably midweek.   Treating  with ancef alone for coag negative staph.  Treat him 2 weeks from date of PIC removal (5-22 ---> 6-5) with TEE (-)  - will arrange with [facility] Would repeat his BCx 1 week after he finishes anbx (due to presence of retained hardware/ICD)   [name] D Clegg NP-C  8:02 AM  Patient seen and examined with Darrick Grinder, NP. We discussed all aspects of the encounter. I agree with the assessment and plan as stated above.  Improved this am on higher dose milrinone but remains very tenuous. Unable to be cardioverted due to LAA clot. Will continue amio and coumadin and attempt DC-CV in 4 weeks as he cannot tolerate AF for long. Agree with switching back to po diuretics. Through Dr. Venetia Constable as interpreter we discussed how tenuous his situation is. Possibly home by Friday. Will f/u with pathology tomorrow re biopsy results. Continue ancef for staph bacteremia. End date 6/5.  Shaune Pascal Mykalah Saari,MD 6:07 PM

## 2013-11-21 ENCOUNTER — Encounter (HOSPITAL_COMMUNITY): Payer: Self-pay | Admitting: Internal Medicine

## 2013-11-21 LAB — BASIC METABOLIC PANEL
BUN: 41 mg/dL — ABNORMAL HIGH (ref 6–23)
CHLORIDE: 103 meq/L (ref 96–112)
CO2: 23 meq/L (ref 19–32)
Calcium: 7.9 mg/dL — ABNORMAL LOW (ref 8.4–10.5)
Creatinine, Ser: 1.37 mg/dL — ABNORMAL HIGH (ref 0.50–1.35)
GFR calc Af Amer: 55 mL/min — ABNORMAL LOW (ref >90)
GFR calc non Af Amer: 47 mL/min — ABNORMAL LOW (ref >90)
Glucose, Bld: 122 mg/dL — ABNORMAL HIGH (ref 70–99)
Potassium: 3.7 mEq/L (ref 3.7–5.3)
SODIUM: 139 meq/L (ref 137–147)

## 2013-11-21 LAB — CBC
HCT: 35 % — ABNORMAL LOW (ref 39.0–52.0)
Hemoglobin: 12 g/dL — ABNORMAL LOW (ref 13.0–17.0)
MCH: 32 pg (ref 26.0–34.0)
MCHC: 34.3 g/dL (ref 30.0–36.0)
MCV: 93.3 fL (ref 78.0–100.0)
PLATELETS: 213 10*3/uL (ref 150–400)
RBC: 3.75 MIL/uL — AB (ref 4.22–5.81)
RDW: 15.8 % — ABNORMAL HIGH (ref 11.5–15.5)
WBC: 9.1 10*3/uL (ref 4.0–10.5)

## 2013-11-21 LAB — CARBOXYHEMOGLOBIN
Carboxyhemoglobin: 1.6 % — ABNORMAL HIGH (ref 0.5–1.5)
METHEMOGLOBIN: 0.8 % (ref 0.0–1.5)
O2 Saturation: 50.3 %
TOTAL HEMOGLOBIN: 13.5 g/dL (ref 13.5–18.0)

## 2013-11-21 LAB — PROTIME-INR
INR: 2.53 — AB (ref 0.00–1.49)
PROTHROMBIN TIME: 26.4 s — AB (ref 11.6–15.2)

## 2013-11-21 MED ORDER — TORSEMIDE 20 MG PO TABS
40.0000 mg | ORAL_TABLET | Freq: Two times a day (BID) | ORAL | Status: DC
  Administered 2013-11-21 – 2013-11-22 (×3): 40 mg via ORAL
  Filled 2013-11-21 (×5): qty 2

## 2013-11-21 MED ORDER — WARFARIN SODIUM 4 MG PO TABS
4.0000 mg | ORAL_TABLET | Freq: Every day | ORAL | Status: DC
  Administered 2013-11-21: 4 mg via ORAL
  Filled 2013-11-21 (×2): qty 1

## 2013-11-21 NOTE — Discharge Instructions (Signed)
Insuficiencia cardaca  (Heart Failure)  Insuficiencia cardaca significa que el corazn tiene problemas para bombear la [place]. Esto dificulta el buen funcionamiento del organismo. La insuficiencia cardaca es una enfermedad de larga duracin (crnica). Es importante que se cuide mucho y que siga el plan de tratamiento que le indique el mdico. CUIDADOS EN EL [employer] medicamentos para el corazn tal como se los prescribi el mdico.  No deje de tomar medicamentos excepto que su mdico lo indique  No se saltee ninguna dosis del medicamento.  Provase de los medicamentos antes de que se acaben.  Tome medicamentos slo como lo indique su mdico o Development worker, international aid.  Permanezca activo si el mdico lo indica. Las Financial trader y los que tengan insuficiencia cardaca grave deben hablar con el mdico acerca de la actividad fsica.  Consuma alimentos saludables para el corazn. Elija alimentos que no contengan grasas trans y sean bajas en grasas saturadas, colesterol y sal (sodio). Esto incluye frutas frescas o congeladas y vegetales, pescado, carnes [place], productos lcteos sin grasa o bajos en grasa, granos enteros y alimentos ricos en fibra. Las lentejas, arvejas y frijoles (legumbres) son tambin buenas opciones.  Limitar el consumo de sal segn lo aconsejado por su mdico.  Cocine en forma saludable. Prepare los alimentos asados, a la parrilla, al horno, hervidos, al vapor o salteados.  Limite los lquidos segn lo aconsejado por su mdico.  Controle su peso todas las [place]. Hgalo despus de hacer pis (orinar) y antes de tomar el desayuno. Anote su peso para llevarlo a la consulta con el mdico.  Tmese la presin arterial y antela, si su mdico se lo indica.  Pregunte al mdico como controlarse el pulso. Controle su pulso segn las indicaciones.  Baje de peso si el mdico se lo indica.  Deje de fumar o mascar tabaco. No use goma de Higher education careers adviser o parches para dejar de fumar sin  la aprobacin de su mdico.  Programe y concurra a las citas con el mdico segn lo indicado.  Las mujeres no embarazadas no deben tomar ms de 1 bebida al [employer]. Los hombres no deben tomar ms de 2 bebidas al [employer]. Hable con su mdico acerca de su consumo de alcohol.  No consuma drogas.  [place] (immunizaciones).  Controle sus enfermedades segn lo indicado por su mdico.  Aprenda a Engineer, maintenance (IT).  Descanse cuando se sienta cansado.  Si hace mucho calor en el exterior:  Evite las actividades intensas.  Utilice aire acondicionado o ventiladores, o pngase en un lugar ms fresco.  Evite la cafena y el alcohol.  Use ropa holgada, ligera y de colores claros.  Si hace mucho fro en el exterior:  Evite las actividades intensas.  Vstase con ropa en capas.  Use mitones o guantes, un sombrero y [place] bufanda cuando salga.  Evite el alcohol.  Aprenda todo sobre la insuficiencia cardaca y [place] apoyo si lo necesita.  Obtenga ayuda para mantener o mejorar su calidad de vida y su capacidad para cuidarse a s mismo, si lo necesita. SOLICITE AYUDA SI:   Aumenta 3 libras (1,4 kg) o ms en un da o 5 libras (2,3 Kg) en una semana.  Le falta el aire ms que lo habitual.  No puede hacer sus actividades habituales.  Se cansa con facilidad.  Tose ms de lo normal, especialmente al realizar actividad fsica.  Observa que se le hinchan o le aumenta la hinchazn (inflamacin) en reas como las [place],  los pies, los tobillos o el vientre (abdomen).  Le cuesta dormir debido a que Film/video editor.  Siente que el corazn palpita rpido (palpitaciones).  Siente mareos o vahdos al pararse. SOLICITE AYUDA DE INMEDIATO SI:   Tiene dificultad para respirar.  Hay un cambio en su estado mental, como estar menos alerta o no poder concentrarse.  Siente dolor u opresin en el pecho.  Se desmaya. ASEGRESE DE QUE:   Comprende estas  instrucciones.  Controlar su enfermedad.  Solicitar ayuda de inmediato si no mejora o si empeora. Document Released: 09/02/2008 Document Revised: 10/01/2012 [facility] Patient Information 2014 [place], [place].

## 2013-11-21 NOTE — Progress Notes (Signed)
ANTICOAGULATION/Antibiotic CONSULT NOTE - Follow Up Consult  Pharmacy Consult for Heparin and Coumadin/ cefazolin Indication: atrial fibrillation and hx CVA/ bacteremia  Patient Measurements: Height: 5\' 7"  (170.2 cm) Weight: 155 lb 1.6 oz (70.353 kg) IBW/kg (Calculated) : 66.1 Heparin Dosing Weight: 72kg  Vital Signs: Temp: 98 F (36.7 C) (06/04 0807) Temp src: Oral (06/04 0807) BP: 98/73 mmHg (06/04 0807) Pulse Rate: 88 (06/04 0807)  Labs:  Recent Labs  11/19/13 0612 11/20/13 0630 11/21/13 0500  HGB 12.2* 12.6* 12.0*  HCT 35.8* 36.8* 35.0*  PLT 234 228 213  LABPROT 27.7* 29.4* 26.4*  INR 2.69* 2.91* 2.53*  HEPARINUNFRC 0.85*  --   --   CREATININE 1.85* 1.86* 1.37*    Estimated Creatinine Clearance: 40.9 ml/min (by C-G formula based on Cr of 1.37).  Assessment: 81yom admitted 11/07/2013  s/p biopsy of fatty lesion in the right RP pharmacy consulted to bridge with heparin and resume coumadin for afib and hx CVA.   PMH: HF, NICM, LBBB, PAF, HTN, HLD, CVA, pulm HTN, seizures  Coag: afib CVR on amio - new will need to dec warfarin dose in near future, INR 2.5 at goal No bleeding reported.  CBC stable TEE shows smoke clot in LAA - DCCV not possible Home dose: 4mg  daily except 6mg  on Friday (INR last clinic visit 1.6)  ID:  line sepsis, PICC removde: afebrile, WBC WNL, UA ok, bacteremia, TEE neg veg - per ID ok to replace PICC 5/27  5/21 blood x3>> 2/2 gpc in clusters (coag neg staph species) S oxacillin 5/21 urine>>mult bacteria   5/22 Vanc>>5/24 5/22 Cefazolin>>(6/5 per ID)   Goal of Therapy:  INR 2-3   Plan:  - Continue cefazolin 1g IV q12h until 6/5 2 weeks from PICC line removal - per ID - possible d/c after doses tomorrow - off heparin - Coumadin 4mg  daily - Daily INR  Bonnita Nasuti Pharm.D. CPP, BCPS Clinical Pharmacist [phone] 11/21/2013 12:00 PM

## 2013-11-21 NOTE — Progress Notes (Signed)
Subjective:   He had TEE earlier this week with plan for DC-CV but this aborted due to heavy smoke and clot.  Weight down 4 pounds. Yesterday lasix stopped.    Denies SOB.  Feels ab bloating is better.  CO-OX down to 50% on  Milrinone at 0.375 mcg  INR 2.5   Objective:   Scheduled Meds: . amiodarone  200 mg Oral BID  . atorvastatin  20 mg Oral q1800  .  ceFAZolin (ANCEF) IV  1 g Intravenous Q12H  . Tadalafil (PAH)  40 mg Oral Daily  . Warfarin - Pharmacist Dosing Inpatient   Does not apply q1800   Continuous Infusions: . milrinone 0.375 mcg/kg/min (11/21/13 0325)   PRN Meds:.acetaminophen, ondansetron (ZOFRAN) IV, sodium chloride  Vital Signs in the last 24 hours: BP 112/76  Pulse 86  Temp(Src) 97.9 F (36.6 C) (Oral)  Resp 26  Ht 5\' 7"  (1.702 m)  Wt 155 lb 1.6 oz (70.353 kg)  BMI 24.29 kg/m2  SpO2 98%  Physical Exam:  Sitting on side of bed. no acute distress.  Neck: JVP 8 Lungs:  Clear  Cardiac:  Irregular rhythm, normal S1 and S2, no S3 Abdomen:  + distended. Nontender. Good BS Extremities:  No edema present Warm Neuro: non focal.   Telemetery: Vpaced, afib 80s   Intake/Output from previous day: 06/03 0701 - 06/04 0700 In: 698 [P.O.:560; I.V.:88; IV Piggyback:50] Out: 450 [Urine:450] Weight Filed Weights   11/19/13 0800 11/20/13 0300 11/21/13 0328  Weight: 157 lb 3 oz (71.3 kg) 159 lb 9.6 oz (72.394 kg) 155 lb 1.6 oz (70.353 kg)    Lab Results: Basic Metabolic Panel:  Recent Labs  11/19/13 0612 11/20/13 0630  NA 137 137  K 3.7 3.6*  CL 96 95*  CO2 24 27  GLUCOSE 116* 105*  BUN 47* 47*  CREATININE 1.85* 1.86*    CBC:  Recent Labs  11/19/13 0612 11/20/13 0630  WBC 10.6* 10.7*  HGB 12.2* 12.6*  HCT 35.8* 36.8*  MCV 93.2 92.7  PLT 234 228    BNP    Component Value Date/Time   PROBNP 9356.0* 11/07/2013 0708    PROTIME: Lab Results  Component Value Date   INR 2.91* 11/20/2013   INR 2.69* 11/19/2013   INR 1.91* 11/18/2013     Telemetry: Paced rhythm  Assessment/Plan:  1. Chronic systolic heart failure on home milrinone 2. Pulmonary hypertension 3. Staph sepsis 4. Indwelling implantable defibrillator and previous PICC line 5. Atrial fibrillation 6. Chronic anticoagulation 7. 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 Chronic kidney disease 9. Hypokalemia  10. LAA clot   Recommendations:  Milrinone 0.375 mcg. Volume status ok. No bb due to low output.  BMET pending.     Unable to cardiovert due heavy LAA smoke and clot noted. Remains in A fib which is complicating his care. Consider DC-CV 30 days.   Per pathologist (Dr. Claudette Laws) possilbe fat necrosis and not liposarcoma. However, lab wants to send biopsy to [facility] for molecular staining. Results will be back probably midweek.   Treating  with ancef alone for coag negative staph.  Treat him 2 weeks from date of PIC removal (5-22 ---> 6-5) with TEE (-)  - will arrange with [facility] Would repeat his BCx 1 week after he finishes anbx (due to presence of retained hardware/ICD)  Transfer to telemetry today.    [name] D Clegg NP-C  7:04 AM  Patient seen and examined with Darrick Grinder, NP. We discussed all aspects of  the encounter. I agree with the assessment and plan as stated above.   He is somewhat improved on higher dose milrinone but co-ox falling again. He has severe LV dysfunction and I think he can just not tolerate AF. Unfortunately, TEE with heavy LAA smoke. Will continue coumadin and amio with hopes for DC-CV in several weeks if he can wait that long.   Will resume po diuretics.   Will touch base with Pathology again to try to get biopsy results  He will finish abx tomorrow. Potentially home in am.  Shaune Pascal Zedrick Springsteen,MD 6:59 PM

## 2013-11-21 NOTE — Progress Notes (Signed)
CARDIAC REHAB PHASE I   PRE:  Rate/Rhythm: 80 paced   BP:  Supine: 105/53  Sitting:   Standing:    SaO2: 93%RA  MODE:  Ambulation: 170 ft   POST:  Rate/Rhythm: 95 paced  BP:  Supine: 120/59  Sitting:   Standing:    SaO2: 96%RA 1500-1520 Pt walked 170 ft on RA with rolling walker with steady gait. Tolerated well. Back to bed after walk.   Graylon Good, RN BSN  11/21/2013 3:15 PM

## 2013-11-22 ENCOUNTER — Other Ambulatory Visit (HOSPITAL_COMMUNITY): Payer: Self-pay | Admitting: Adult Health

## 2013-11-22 ENCOUNTER — Encounter (HOSPITAL_COMMUNITY): Payer: Self-pay

## 2013-11-22 LAB — CBC
HEMATOCRIT: 39 % (ref 39.0–52.0)
Hemoglobin: 12.9 g/dL — ABNORMAL LOW (ref 13.0–17.0)
MCH: 31.1 pg (ref 26.0–34.0)
MCHC: 33.1 g/dL (ref 30.0–36.0)
MCV: 94 fL (ref 78.0–100.0)
PLATELETS: 221 10*3/uL (ref 150–400)
RBC: 4.15 MIL/uL — ABNORMAL LOW (ref 4.22–5.81)
RDW: 16 % — AB (ref 11.5–15.5)
WBC: 8.6 10*3/uL (ref 4.0–10.5)

## 2013-11-22 LAB — BASIC METABOLIC PANEL
BUN: 51 mg/dL — ABNORMAL HIGH (ref 6–23)
CHLORIDE: 94 meq/L — AB (ref 96–112)
CO2: 28 mEq/L (ref 19–32)
CREATININE: 1.87 mg/dL — AB (ref 0.50–1.35)
Calcium: 8.9 mg/dL (ref 8.4–10.5)
GFR calc non Af Amer: 33 mL/min — ABNORMAL LOW (ref >90)
GFR, EST AFRICAN AMERICAN: 38 mL/min — AB (ref >90)
Glucose, Bld: 114 mg/dL — ABNORMAL HIGH (ref 70–99)
POTASSIUM: 3.4 meq/L — AB (ref 3.7–5.3)
Sodium: 134 mEq/L — ABNORMAL LOW (ref 137–147)

## 2013-11-22 LAB — CARBOXYHEMOGLOBIN
Carboxyhemoglobin: 1.5 % (ref 0.5–1.5)
Methemoglobin: 0.9 % (ref 0.0–1.5)
O2 Saturation: 52.1 %
TOTAL HEMOGLOBIN: 12.9 g/dL — AB (ref 13.5–18.0)

## 2013-11-22 LAB — PROTIME-INR
INR: 1.94 — AB (ref 0.00–1.49)
Prothrombin Time: 21.6 seconds — ABNORMAL HIGH (ref 11.6–15.2)

## 2013-11-22 MED ORDER — MILRINONE IN DEXTROSE 20 MG/100ML IV SOLN: 0.3750 ug/kg/min | INTRAVENOUS | Status: DC

## 2013-11-22 MED ORDER — LOSARTAN POTASSIUM 25 MG PO TABS
12.5000 mg | ORAL_TABLET | Freq: Two times a day (BID) | ORAL | Status: DC
Start: 2013-11-22 — End: 2013-11-22
  Administered 2013-11-22: 12.5 mg via ORAL
  Filled 2013-11-22 (×2): qty 0.5

## 2013-11-22 MED ORDER — WARFARIN SODIUM 7.5 MG PO TABS
7.5000 mg | ORAL_TABLET | Freq: Once | ORAL | Status: AC
  Administered 2013-11-22: 7.5 mg via ORAL
  Filled 2013-11-22: qty 1

## 2013-11-22 MED ORDER — SPIRONOLACTONE 12.5 MG HALF TABLET
12.5000 mg | ORAL_TABLET | Freq: Every day | ORAL | Status: DC
  Administered 2013-11-22: 12.5 mg via ORAL
  Filled 2013-11-22: qty 1

## 2013-11-22 MED ORDER — METOLAZONE 2.5 MG PO TABS: 2.5000 mg | ORAL_TABLET | ORAL | Status: DC | PRN

## 2013-11-22 MED ORDER — AMIODARONE HCL 200 MG PO TABS: 200.0000 mg | ORAL_TABLET | Freq: Two times a day (BID) | ORAL | Status: DC

## 2013-11-22 MED ORDER — SPIRONOLACTONE 25 MG PO TABS: 12.5000 mg | ORAL_TABLET | Freq: Every day | ORAL | Status: DC

## 2013-11-22 NOTE — Discharge Summary (Signed)
Advanced Heart Failure Team  Discharge Summary   Patient ID: Tyler Christensen MRN: [id number], DOB/AGE: [id number] y.o. Admit date: 11/07/2013 D/C date:     11/22/2013   Primary Discharge Diagnoses:  1. Acute/Chronic systolic heart failure on home milrinone 0.375 mcg. Followed by [facility] 2. Pulmonary hypertension  3. Coag negative Staph Sepsis - Completed 2 weeks of antibiotics. Plan for blood cultures 1 week after discharge due to ICD.  4. Indwelling implantable defibrillator and previous PICC line  5. Atrial fibrillation - TEE with clot  6. Chronic anticoagulation  7. 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   8. Chronic kidney disease  9. Hypokalemia  10. LAA clot    Hospital Course:   Tyler Christensen is a 80 y.o. [name] (non-english speaking)well known to the HF team with a history of chronic systolic CHF (ECHO 07/6331 EF 20%) on chronic home Milrinone 0.25 mcg, NICM, LBBB, PAF (on coumadin), HTN, HLD, CVA, Pulmonary Hypertension on tadalafil, PAF last DC-CV 08/14/2013 on chronic coumadin, and h/o seizure d/o (2/2 CVA 05/2012).   He was admitted with increased abdominal pain, bloating and dyspnea thought to be related to his HF. However CT of abdomen was performed and revealed retroperitoneal mass. This was biopsied in IR after his INR drifted down with results sent to the [facility] for final pathology which was benign.   He also had an elevated WBC on admit with + blood cultures for staph bacteremia. ID consulted with recommendations to remove PICC, TEE, and antibiotics. PICC was removed on May 22 and TEE was performed with no vegetations and normal device leads. He received 2 weeks of IV antibiotics and will have repeat blood culture x 1 due to retained ICD. PICC line was reinserted May 27th.  On interrogation of his ICD he was found to have AF for about 1 month which was felt to be triggering his HF flare. Mixed venous saturations dropped and milrinone was increased to  0.375 mcg . He was diuresed with IV lasix. He was also loaded on amiodarone and plan was for TEE and DC-CV. Unfortunately, TEE which showed a LAA clot therefore cardioversion was not attempted. He will continue on coumadin and in 30 days we can reconsider getting him back into a normal rhythm.   As his volume status improved he was transitioned to back to torsemide 40 mg twice a day. Overall he diuresed 16 pounds. He will remain off carvedilol due to low output and continue losartan 12.5 mg twice a day.   He will continue to be followed closely in the HF clinic with follow up on June 11 at 10:40. [facility] will continue to follow for home Milrinone and weekly BMET.    Discharge Weight Range: discharge weight 154 pounds.  Discharge Vitals: Blood pressure 100/65, pulse 87, temperature 97.9 F (36.6 C), temperature source Oral, resp. rate 12, height 5\' 7"  (1.702 m), weight 154 lb 1.6 oz (69.9 kg), SpO2 93.00%.  Labs: Lab Results  Component Value Date   WBC 8.6 11/22/2013   HGB 12.9* 11/22/2013   HCT 39.0 11/22/2013   MCV 94.0 11/22/2013   PLT 221 11/22/2013    Recent Labs Lab 11/22/13 0415  NA 134*  K 3.4*  CL 94*  CO2 28  BUN 51*  CREATININE 1.87*  CALCIUM 8.9  GLUCOSE 114*   Lab Results  Component Value Date   CHOL 128 06/12/2012   HDL 27* 06/12/2012   LDLCALC 78 06/12/2012   TRIG 113  06/12/2012   BNP (last 3 results)  Recent Labs  11/07/13 0708  PROBNP 9356.0*    Diagnostic Studies/Procedures   No results found.  Discharge Medications     Medication List    STOP taking these medications       carvedilol 6.25 MG tablet  Commonly known as:  COREG     magnesium oxide 400 MG tablet  Commonly known as:  MAG-OX      TAKE these medications       amiodarone 200 MG tablet  Commonly known as:  PACERONE  Take 1 tablet (200 mg total) by mouth 2 (two) times daily.     Fenofibric Acid 45 MG Cpdr  TAKE 1 CAPSULE BY MOUTH EVERY DAY     loratadine 10 MG tablet  Commonly  known as:  CLARITIN  TAKE 1 TABLET BY MOUTH DAILY TAKE 1 TABLET BY MOUTH DAILY     losartan 25 MG tablet  Commonly known as:  COZAAR  Take 12.5 mg by mouth 2 (two) times daily.     metolazone 2.5 MG tablet  Commonly known as:  ZAROXOLYN  Take 1 tablet (2.5 mg total) by mouth as needed.     milrinone 20 MG/100ML Soln infusion  Commonly known as:  PRIMACOR  Inject 26.775 mcg/min into the vein continuous.     potassium chloride SA 20 MEQ tablet  Commonly known as:  K-DUR,KLOR-CON  Take 2 tablets (40 mEq total) by mouth daily. TAKE EXTRA TAB WHEN YOU TAKE METOLAZONE     simvastatin 40 MG tablet  Commonly known as:  ZOCOR  Take 40 mg by mouth every evening.     spironolactone 25 MG tablet  Commonly known as:  ALDACTONE  Take 0.5 tablets (12.5 mg total) by mouth daily.     Tadalafil (PAH) 20 MG Tabs  Take 2 tablets (40 mg total) by mouth daily.     torsemide 20 MG tablet  Commonly known as:  DEMADEX  Take 2 tablets (40 mg total) by mouth 2 (two) times daily.     traMADol 50 MG tablet  Commonly known as:  ULTRAM  TAKE 1 TABLET BY MOUTH TWICE DAILY AS NEEDED FOR PAIN     warfarin 4 MG tablet  Commonly known as:  COUMADIN  Take 1 tablet on all days except 1 and 1/2 tablets on Friday or as directed by coumadin clinic.        Disposition   The patient will be discharged in stable condition to home.     Discharge Instructions   ACE Inhibitor / ARB already ordered    Complete by:  As directed      Diet - low sodium heart healthy    Complete by:  As directed      Heart Failure patients record your daily weight using the same scale at the same time of day    Complete by:  As directed      Increase activity slowly    Complete by:  As directed           Follow-up Information   Follow up with Glori Bickers, MD. ( June 11 at [place] 0400)    Specialty:  Cardiology   Contact information:   [place]  [id number] [phone]         Duration of Discharge Encounter: Greater than 35 minutes   Signed, Conrad [place] NP-C  11/22/2013, 11:12 AM  Patient seen  and examined with Darrick Grinder, NP. We discussed all aspects of the encounter. I agree with the assessment and plan as stated above. I have edited the note with my changes.  Tyler Reuss,MD 8:12 PM

## 2013-11-22 NOTE — Progress Notes (Addendum)
Subjective:   He had TEE earlier this week with plan for DC-CV but this aborted due to heavy smoke and clot.  Weight down 1 pounds. Yesterday lasix stopped.    Denies SOB.  Feels ab bloating is better.  CO-OX down to 52% on  Milrinone at 0.375 mcg  INR 1.9  Creatinine 1.8>1.3>1.8 Objective:   Scheduled Meds: . amiodarone  200 mg Oral BID  . atorvastatin  20 mg Oral q1800  .  ceFAZolin (ANCEF) IV  1 g Intravenous Q12H  . Tadalafil (PAH)  40 mg Oral Daily  . torsemide  40 mg Oral BID  . warfarin  4 mg Oral q1800  . Warfarin - Pharmacist Dosing Inpatient   Does not apply q1800   Continuous Infusions: . milrinone 0.375 mcg/kg/min (11/22/13 0545)   PRN Meds:.acetaminophen, ondansetron (ZOFRAN) IV, sodium chloride  Vital Signs in the last 24 hours: BP 105/62  Pulse 94  Temp(Src) 98.7 F (37.1 C) (Oral)  Resp 31  Ht 5\' 7"  (1.702 m)  Wt 154 lb 1.6 oz (69.9 kg)  BMI 24.13 kg/m2  SpO2 95%  Physical Exam:  Sitting on side of bed. no acute distress.  Neck: JVP 8 Lungs:  Clear  Cardiac:  Irregular rhythm, normal S1 and S2, no S3 Abdomen:  + distended. Nontender. Good BS Extremities:  No edema present Warm Neuro: non focal.   Telemetery: Vpaced, afib 80s   Intake/Output from previous day: 06/04 0701 - 06/05 0700 In: 354 [P.O.:120; I.V.:184; IV Piggyback:50] Out: 8921 [Urine:1650] Weight Filed Weights   11/20/13 0300 11/21/13 0328 11/22/13 0305  Weight: 159 lb 9.6 oz (72.394 kg) 155 lb 1.6 oz (70.353 kg) 154 lb 1.6 oz (69.9 kg)    Lab Results: Basic Metabolic Panel:  Recent Labs  11/21/13 0500 11/22/13 0415  NA 139 134*  K 3.7 3.4*  CL 103 94*  CO2 23 28  GLUCOSE 122* 114*  BUN 41* 51*  CREATININE 1.37* 1.87*    CBC:  Recent Labs  11/21/13 0500 11/22/13 0415  WBC 9.1 8.6  HGB 12.0* 12.9*  HCT 35.0* 39.0  MCV 93.3 94.0  PLT 213 221    BNP    Component Value Date/Time   PROBNP 9356.0* 11/07/2013 0708    PROTIME: Lab Results  Component Value  Date   INR 1.94* 11/22/2013   INR 2.53* 11/21/2013   INR 2.91* 11/20/2013    Telemetry: Paced rhythm  Assessment/Plan:  1. Chronic systolic heart failure on home milrinone 2. Pulmonary hypertension 3. Staph sepsis 4. Indwelling implantable defibrillator and previous PICC line 5. Atrial fibrillation 6. Chronic anticoagulation 7. 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 Chronic kidney disease 9. Hypokalemia  10. LAA clot   Recommendations:  Milrinone 0.375 mcg. Volume status ok. No bb due to low output.    Unable to cardiovert due heavy LAA smoke and clot noted. Remains in A fib which is complicating his care. Consider DC-CV 30 days.   Per pathologist (Dr. Claudette Laws) possilbe fat necrosis and not liposarcoma. However, lab wants to send biopsy to [facility] for molecular staining. Results will be back probably midweek.   Treating  with ancef alone for coag negative staph.  He completed 2 weeks of antibiotics after PICC removal (5-22 ---> 6-5) with TEE (-)  - will arrange with [facility] Would repeat his BCx 1 week after he finishes anbx (due to presence of retained hardware/ICD)  Home today with [facility] and weekly BMET.   [name] D Clegg NP-C  7:29  AM  Patient seen and examined with Darrick Grinder, NP. We discussed all aspects of the encounter. I agree with the assessment and plan as stated above.   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   He is somewhat improved on higher dose milrinone but co-ox remains borderline. Stable on po diuretics. Has finished abx for staph bacteremia. He has severe LV dysfunction and I think he can just not tolerate AF. Unfortunately, TEE with heavy LAA smoke. Will continue coumadin and amio with hopes for DC-CV in several weeks if he can wait that long.   Can d/c home today with close f/u in HF Clinic. If he is worse at his visit next week. I would consider DC-CV despite increased risk of thrombotic events with LAA clot. Continue  all home meds except carvedilol which I removed from his medication bag.   [place] for d/c today  Javier Glazier 7:56 AM

## 2013-11-22 NOTE — Progress Notes (Signed)
ANTICOAGULATION/Antibiotic CONSULT NOTE - Follow Up Consult  Pharmacy Consult for Heparin and Coumadin/ cefazolin Indication: atrial fibrillation and hx CVA/ bacteremia  Patient Measurements: Height: 5\' 7"  (170.2 cm) Weight: 154 lb 1.6 oz (69.9 kg) IBW/kg (Calculated) : 66.1 Heparin Dosing Weight: 72kg  Vital Signs: Temp: 97.9 F (36.6 C) (06/05 0746) Temp src: Oral (06/05 0746) BP: 100/65 mmHg (06/05 0746) Pulse Rate: 87 (06/05 0746)  Labs:  Recent Labs  11/20/13 0630 11/21/13 0500 11/22/13 0415  HGB 12.6* 12.0* 12.9*  HCT 36.8* 35.0* 39.0  PLT 228 213 221  LABPROT 29.4* 26.4* 21.6*  INR 2.91* 2.53* 1.94*  CREATININE 1.86* 1.37* 1.87*    Estimated Creatinine Clearance: 29.9 ml/min (by C-G formula based on Cr of 1.87).  Assessment: 81yom admitted 11/07/2013  s/p biopsy of fatty lesion in the right RP pharmacy consulted to bridge with heparin and resume coumadin for afib and hx CVA.   PMH: HF, NICM, LBBB, PAF, HTN, HLD, CVA, pulm HTN, seizures  Coag: afib CVR on amio - new will need to dec warfarin dose in near future, INR 1.9 < goal - will give boost today.   No bleeding reported.  CBC stable TEE shows smoke clot in LAA - DCCV not possible Home dose: 4mg  daily except 6mg  on Friday (INR last clinic visit 1.6)  ID:  line sepsis, PICC removde: afebrile, WBC WNL, UA ok, bacteremia, TEE neg veg - per ID ok to replace PICC 5/27  5/21 blood x3>> 2/2 gpc in clusters (coag neg staph species) S oxacillin 5/21 urine>>mult bacteria   5/22 Vanc>>5/24 5/22 Cefazolin>>(6/5 per ID)  - will give last dose prior to d/c  Goal of Therapy:  INR 2-3   Plan:  Cefazolin last dose prior to d/c 6/5 - Coumadin 7.5mg  x1 today D/c home on Coumadin 4mg  daily with INR per [facility] Monday  Bonnita Nasuti Pharm.D. CPP, BCPS Clinical Pharmacist [phone] 11/22/2013 7:58 AM

## 2013-11-26 ENCOUNTER — Ambulatory Visit (INDEPENDENT_AMBULATORY_CARE_PROVIDER_SITE_OTHER): Payer: Medicare Other | Admitting: Interventional Cardiology

## 2013-11-26 DIAGNOSIS — I4891 Unspecified atrial fibrillation: Secondary | ICD-10-CM

## 2013-11-26 LAB — POCT INR: INR: 3.7

## 2013-11-28 ENCOUNTER — Other Ambulatory Visit: Payer: Self-pay

## 2013-11-28 ENCOUNTER — Ambulatory Visit (HOSPITAL_COMMUNITY)
Admit: 2013-11-28 | Discharge: 2013-11-28 | Disposition: A | Discharge status: Home / Self Care | Payer: Medicare Other | Attending: Internal Medicine | Admitting: Internal Medicine

## 2013-11-28 VITALS — BP 102/54 | HR 86 | Wt 160.0 lb

## 2013-11-28 DIAGNOSIS — I5189 Other ill-defined heart diseases: Secondary | ICD-10-CM | POA: Diagnosis not present

## 2013-11-28 DIAGNOSIS — Z7901 Long term (current) use of anticoagulants: Secondary | ICD-10-CM | POA: Insufficient documentation

## 2013-11-28 DIAGNOSIS — I129 Hypertensive chronic kidney disease with stage 1 through stage 4 chronic kidney disease, or unspecified chronic kidney disease: Secondary | ICD-10-CM | POA: Insufficient documentation

## 2013-11-28 DIAGNOSIS — Z95810 Presence of automatic (implantable) cardiac defibrillator: Secondary | ICD-10-CM | POA: Insufficient documentation

## 2013-11-28 DIAGNOSIS — I428 Other cardiomyopathies: Secondary | ICD-10-CM | POA: Insufficient documentation

## 2013-11-28 DIAGNOSIS — I5022 Chronic systolic (congestive) heart failure: Secondary | ICD-10-CM | POA: Insufficient documentation

## 2013-11-28 DIAGNOSIS — N189 Chronic kidney disease, unspecified: Secondary | ICD-10-CM | POA: Insufficient documentation

## 2013-11-28 DIAGNOSIS — I4891 Unspecified atrial fibrillation: Secondary | ICD-10-CM | POA: Insufficient documentation

## 2013-11-28 DIAGNOSIS — R1900 Intra-abdominal and pelvic swelling, mass and lump, unspecified site: Secondary | ICD-10-CM | POA: Insufficient documentation

## 2013-11-28 DIAGNOSIS — Z9861 Coronary angioplasty status: Secondary | ICD-10-CM | POA: Insufficient documentation

## 2013-11-28 DIAGNOSIS — I252 Old myocardial infarction: Secondary | ICD-10-CM | POA: Insufficient documentation

## 2013-11-28 DIAGNOSIS — I2789 Other specified pulmonary heart diseases: Secondary | ICD-10-CM | POA: Diagnosis not present

## 2013-11-28 DIAGNOSIS — E785 Hyperlipidemia, unspecified: Secondary | ICD-10-CM | POA: Insufficient documentation

## 2013-11-28 DIAGNOSIS — I2721 Secondary pulmonary arterial hypertension: Secondary | ICD-10-CM

## 2013-11-28 MED ORDER — POTASSIUM CHLORIDE CRYS ER 20 MEQ PO TBCR: 20.0000 meq | EXTENDED_RELEASE_TABLET | Freq: Every day | ORAL | Status: DC

## 2013-11-28 MED ORDER — TORSEMIDE 20 MG PO TABS: ORAL_TABLET | ORAL | Status: DC

## 2013-11-28 MED ORDER — WARFARIN SODIUM 4 MG PO TABS: ORAL_TABLET | ORAL | Status: DC

## 2013-11-28 NOTE — Progress Notes (Signed)
Patient ID: Tyler Christensen, male   DOB: 10/24/1932, 80 y.o.   MRN: [id number]  HPI: Mr. Strite is a 80 y.o. [name] (non-english speaking) w/ PMHx significant for chronic systolic CHF (EF 76%), NICM, LBBB, PAF (on coumadin), HTN, HLD, CVA, Pulmonary Hypertension and h/o seizure d/o (2/2 CVA 05/2012).   Admitted to [facility] 06/2012 with acute decompensated heart failure EF 20%. As noted below, also diagnosed with severe pulmonary hypertension (PAPs ~ 90) from initial RHC and placed on Milrinone and Revatio (eventually transitioned to tadalafil 40) with marked clinical response and decrease in PA pressures. VQ low probability for pulmonary embolus.  Discharged on Milrinone at 0.375 mcg via PICC. Discharge Weight 146 pounds. Attempted to wean milrinone in 9/14 but failed due to worsening HF and PAH. Milrinone restarted.   08/14/12 S/P successful DC-CV of AF 10/26/12 S/P BiV-ICD implant 09/28/12 ECHO EF 15% 01/07/13 ECHO EF 20%, restrictive diastolic function, mildly decreased RV size and systolic fxn, mild to moderate MR, PA systolic pressure 59 mmHg  02/21/13 ECHO EF 20% PA pressure mean 59   RHC 02/21/13 (off milrinone) RA = 9 with v-waves to 20  RV = 72/8/20  PA = 69/27 (44)  PCW = 18-20  Fick cardiac output/index = 2.7/1.5  PVR = 9.6 woods  FA sat = 96%  PA sat = 48%, 52%  Follow up: Recently admitted to hospital for two weeks. Found to have staph aureus bacteremia treated with IV abx. TEE negative for endocarditis. Also found to retroperitoneal mass and biopsy revealed benign lesion. He had evidence of low output thought to be due to atrial fibrillation and milrinone increased. Attempted TEE and DC-CV but had LAA clot so we deferred cardioversion.  Returns for f/u. Feels great. Breathing better. Good appetite. Energy improved. No edema. No dizziness. Weight up about 5 pounds but stable. No edema.   Since last visit called with increased weight gain and SOB and swelling. Gave 80 mg IV lasix with 40 meq of  potassium and he had increased UOP and weight decreased. Weight at home 143-145 lbs. Denies SOB, orthopnea, CP or LE edema. + abdominal distention. Appetite good. No heart palpitations. Reports following low salt diet diet and drinking less than 2L a day.    NO ACE-I 2/2 rash  Labs (7/14): HCT 39.7, K 4.6, creatinine 1.2          (8/26) HCT 37.7 K 4.3 Creatinine 1.6          (02/26/13) HCT 38.2, K 4.4, Creatinine 1.43 Mag 2.2         (10/14) K 4.5, creatinine 1.55    (04/09/13) K+ 3.9, Cr 1.40    (04/30/13) K +4.0, Cr 1.64, Mag 2.1, pro-BNP 1057     (06/25/13) K 4.2 Creatinine 1.58 Magnesium 2.0     (07/16/13) K 4.2, Cr 1.67    (2/15) creatinine 1.97 => 1.94, K 4.6, HCT 39.4    (4/15) K 4.5, creatinine 1.56, pro-BNP 6912    (4/15) K 3.9, creatinine 2.39, mag 2.1     (09/2013) K 3.7, creatinine 2.03     11/26/13 K 4.6 cr 2.8 hgb 13.2    SH: Lives with his wife here in [place].   ROS: All systems negative except as listed in HPI, PMH and Problem List.  Past Medical History  Diagnosis Date  . HTN (hypertension)   . High cholesterol   . CHF (congestive heart failure)   . BBBB (bilateral bundle branch block)   .  LBBB (left bundle branch block)   . Weakness   . Tired   . Seizures 06/11/2012    new onset/notes (06/11/2012)  . SOB (shortness of breath)     "sometimes when I lay down;; related to not taking my RX" (06/11/2012)  . Myocardial infarction     06/10/2012  . Atrial fibrillation   . Stroke   . Atrial thrombus     left  . Pulmonary HTN     Current Outpatient Prescriptions  Medication Sig Dispense Refill  . amiodarone (PACERONE) 200 MG tablet Take 1 tablet (200 mg total) by mouth 2 (two) times daily.  60 tablet  6  . Choline Fenofibrate (FENOFIBRIC ACID) 45 MG CPDR TAKE 1 CAPSULE BY MOUTH EVERY DAY  30 capsule  3  . loratadine (CLARITIN) 10 MG tablet TAKE 1 TABLET BY MOUTH DAILY TAKE 1 TABLET BY MOUTH DAILY  30 tablet  6  . losartan (COZAAR) 25 MG tablet Take 12.5 mg by  mouth 2 (two) times daily.      . magnesium oxide (MAG-OX) 400 MG tablet Take 400 mg by mouth daily.      . metolazone (ZAROXOLYN) 2.5 MG tablet Take 1 tablet (2.5 mg total) by mouth as needed.  10 tablet  3  . milrinone (PRIMACOR) 20 MG/100ML SOLN infusion Inject 26.775 mcg/min into the vein continuous.  100 mL  6  . potassium chloride SA (K-DUR,KLOR-CON) 20 MEQ tablet Take 20 mEq by mouth 2 (two) times daily. TAKE EXTRA TAB WHEN YOU TAKE METOLAZONE      . simvastatin (ZOCOR) 40 MG tablet Take 40 mg by mouth every evening.      Marland Kitchen spironolactone (ALDACTONE) 25 MG tablet Take 25 mg by mouth daily.      . Tadalafil, PAH, 20 MG TABS Take 2 tablets (40 mg total) by mouth daily.  60 tablet  6  . torsemide (DEMADEX) 20 MG tablet Take 2 tablets (40 mg total) by mouth 2 (two) times daily.  120 tablet  3  . traMADol (ULTRAM) 50 MG tablet TAKE 1 TABLET BY MOUTH TWICE DAILY AS NEEDED FOR PAIN  60 tablet  1  . warfarin (COUMADIN) 4 MG tablet Take 1 tablet on all days except 1 and 1/2 tablets on Friday or as directed by coumadin clinic.  35 tablet  3   No current facility-administered medications for this encounter.    Filed Vitals:   11/28/13 1038  BP: 102/54  Pulse: 86  Weight: 160 lb (72.576 kg)  SpO2: 95%   PHYSICAL EXAM: General:  Elderly, well appearing. NAD.No resp difficulty; interpreter present HEENT: normal Neck: supple. JVD 6  Carotids 2+ bilaterally; no bruits. No lymphadenopathy or thryomegaly appreciated. Cor: PMI laterally displaced. irreg. No s3 or murmur.  Lungs: clear Abdomen: soft, nontender, non distended. No hepatosplenomegaly. No bruits or masses. Good bowel sounds. Extremities: no cyanosis, clubbing, rash. No ankle edema.  RUE PICC  Neuro: alert & orientedx3, cranial nerves grossly intact. Moves all 4 extremities w/o difficulty. Affect pleasant.   ASSESSMENT & PLAN:  1. Chronic systolic CHF: NICM, EF 94%  s/p CRT-D Optivol; Translator used - Much improved NYHA III.  Volume status low with mild a/c renal failure. Will stop torsemide and potassium x 2 days. On Saturday restart torsemide at 40/20. Recheck BMET next week. Continue milrinone.  - Not on Ace-I due to rash.  - Reinforced the need and importance of daily weights, a low sodium diet, and fluid restriction (less than  2 L a day) through interpreter. Instructed to call the HF clinic if weight increases more than 3 lbs overnight or 5 lbs in a week.  2. Paroxysmal atrial fibrillation: Suspect this making his HF worse. Had LAA clot on TEE will consider DC-CV after 4 weeks of therapeutic INRS. Continue Coumadin 3. Pulmonary arterial hypertension: Noted on RHC 02/21/13.  Now on Adcirca 40 mg daily and milrinone. .  4. Acute on CKD: As above.  5. Retroperitoneal mass - benign by biopsy  Follow up 2 weeks  Total time spent 45 minutes. Over half that time spent discussing above.   Glori Bickers   11/28/2013

## 2013-11-28 NOTE — Progress Notes (Signed)
Interpreter Lesle Chris for failure clinic

## 2013-11-28 NOTE — Addendum Note (Signed)
Encounter addended by: Scarlette Calico, RN on: 11/28/2013 11:26 AM<BR>     Documentation filed: Patient Instructions Section

## 2013-11-28 NOTE — Addendum Note (Signed)
Encounter addended by: Scarlette Calico, RN on: 11/28/2013 12:20 PM<BR>     Documentation filed: Orders

## 2013-11-28 NOTE — Patient Instructions (Addendum)
Stop Torsemide today and tomorrow, restart on Saturday at 2 tabs in AM and 1 tab in PM  Decrease Potassium to 1 tab daily  Your physician recommends that you schedule a follow-up appointment in: 2 weeks

## 2013-11-29 ENCOUNTER — Ambulatory Visit (INDEPENDENT_AMBULATORY_CARE_PROVIDER_SITE_OTHER): Payer: Medicare Other | Admitting: Cardiology

## 2013-11-29 DIAGNOSIS — I4891 Unspecified atrial fibrillation: Secondary | ICD-10-CM

## 2013-11-29 LAB — POCT INR: INR: 2

## 2013-12-02 ENCOUNTER — Encounter (HOSPITAL_COMMUNITY): Payer: Self-pay

## 2013-12-02 ENCOUNTER — Encounter: Payer: Self-pay | Admitting: *Deleted

## 2013-12-03 ENCOUNTER — Ambulatory Visit (INDEPENDENT_AMBULATORY_CARE_PROVIDER_SITE_OTHER): Payer: Medicare Other | Admitting: Interventional Cardiology

## 2013-12-03 ENCOUNTER — Encounter: Payer: Self-pay | Admitting: Anesthesiology

## 2013-12-03 DIAGNOSIS — I4891 Unspecified atrial fibrillation: Secondary | ICD-10-CM

## 2013-12-03 LAB — POCT INR: INR: 2.1

## 2013-12-05 ENCOUNTER — Encounter: Payer: Self-pay | Admitting: Internal Medicine

## 2013-12-11 ENCOUNTER — Ambulatory Visit (INDEPENDENT_AMBULATORY_CARE_PROVIDER_SITE_OTHER): Payer: Medicare Other | Admitting: Cardiology

## 2013-12-11 LAB — POCT INR: INR: 2.5

## 2013-12-11 NOTE — Progress Notes (Signed)
Patient ID: Tyler Christensen, male   DOB: [birth year], 80 y.o.   MRN: [id number]  HPI: Tyler Christensen is a 80 y.o. [name] (non-english speaking) w/ PMHx significant for chronic systolic CHF (EF 57%), NICM, LBBB, PAF (on coumadin), HTN, HLD, CVA, Pulmonary Hypertension and h/o seizure d/o (2/2 CVA 05/2012).   Admitted to [facility] 06/2012 with acute decompensated heart failure EF 20%. As noted below, also diagnosed with severe pulmonary hypertension (PAPs ~ 90) from initial RHC and placed on Milrinone and Revatio (eventually transitioned to tadalafil 40) with marked clinical response and decrease in PA pressures. VQ low probability for pulmonary embolus.  Discharged on Milrinone at 0.375 mcg via PICC. Discharge Weight 146 pounds. Attempted to wean milrinone in 9/14 but failed due to worsening HF and PAH. Milrinone restarted.  Recently admitted to hospital for two weeks. Found to have staph aureus bacteremia treated with IV abx. TEE negative for endocarditis. Also found to retroperitoneal mass and biopsy revealed benign lesion. He had evidence of low output thought to be due to atrial fibrillation and milrinone increased. Attempted TEE and DC-CV but had LAA clot so deferred cardioversion.  He returns for follow up. Last visit torsemide was held for 2 days and then he restarted 40 mg in am and 20 mg in pm. Earlier this week home milrinone was out for a couple of days. AHC restarted and now he is back to baseline. Denies SOB/PND/Orthopnea. No bleeding problems   Weight at home 158 -160 pounds . Appetite ok. AHC following for home milrinone.   NO ACE-I 2/2 rash   08/14/12 S/P successful DC-CV of AF 10/26/12 S/P BiV-ICD implant 09/28/12 ECHO EF 15% 01/07/13 ECHO EF 20%, restrictive diastolic function, mildly decreased RV size and systolic fxn, mild to moderate MR, PA systolic pressure 59 mmHg  02/21/13 ECHO EF 20% PA pressure mean 59   RHC 02/21/13 (off milrinone) RA = 9 with v-waves to 20  RV = 72/8/20  PA = 69/27 (44)  PCW  = 18-20  Fick cardiac output/index = 2.7/1.5  PVR = 9.6 woods  FA sat = 96%  PA sat = 48%, 52%  Labs (7/14): HCT 39.7, K 4.6, creatinine 1.2          (8/26) HCT 37.7 K 4.3 Creatinine 1.6          (02/26/13) HCT 38.2, K 4.4, Creatinine 1.43 Mag 2.2         (10/14) K 4.5, creatinine 1.55    (04/09/13) K+ 3.9, Cr 1.40    (04/30/13) K +4.0, Cr 1.64, Mag 2.1, pro-BNP 1057     (06/25/13) K 4.2 Creatinine 1.58 Magnesium 2.0     (07/16/13) K 4.2, Cr 1.67    (2/15) creatinine 1.97 => 1.94, K 4.6, HCT 39.4    (4/15) K 4.5, creatinine 1.56, pro-BNP 6912    (4/15) K 3.9, creatinine 2.39, mag 2.1     (09/2013) K 3.7, creatinine 2.03     11/26/13 K 4.6 cr 2.8 hgb 13.2     12/11/13 K 3.4 Creatinine 2.14  Magnesium 2.1    SH: Lives with his wife here in [place].   ROS: All systems negative except as listed in HPI, PMH and Problem List.  Past Medical History  Diagnosis Date  . HTN (hypertension)   . High cholesterol   . CHF (congestive heart failure)   . BBBB (bilateral bundle branch block)   . LBBB (left bundle branch block)   . Weakness   . Tired   .  Seizures 06/11/2012    new onset/notes (06/11/2012)  . SOB (shortness of breath)     "sometimes when I lay down;; related to not taking my RX" (06/11/2012)  . Myocardial infarction     06/10/2012  . Atrial fibrillation   . Stroke   . Atrial thrombus     left  . Pulmonary HTN     Current Outpatient Prescriptions  Medication Sig Dispense Refill  . amiodarone (PACERONE) 200 MG tablet Take 1 tablet (200 mg total) by mouth 2 (two) times daily.  60 tablet  6  . Choline Fenofibrate (FENOFIBRIC ACID) 45 MG CPDR TAKE 1 CAPSULE BY MOUTH EVERY DAY  30 capsule  3  . loratadine (CLARITIN) 10 MG tablet TAKE 1 TABLET BY MOUTH DAILY TAKE 1 TABLET BY MOUTH DAILY  30 tablet  6  . losartan (COZAAR) 25 MG tablet Take 12.5 mg by mouth 2 (two) times daily.      . magnesium oxide (MAG-OX) 400 MG tablet Take 400 mg by mouth daily.      . metolazone (ZAROXOLYN)  2.5 MG tablet Take 1 tablet (2.5 mg total) by mouth as needed.  10 tablet  3  . milrinone (PRIMACOR) 20 MG/100ML SOLN infusion Inject 26.775 mcg/min into the vein continuous.  100 mL  6  . potassium chloride SA (K-DUR,KLOR-CON) 20 MEQ tablet Take 1 tablet (20 mEq total) by mouth daily. TAKE EXTRA TAB WHEN YOU TAKE METOLAZONE  30 tablet  3  . simvastatin (ZOCOR) 40 MG tablet Take 40 mg by mouth every evening.      Marland Kitchen spironolactone (ALDACTONE) 25 MG tablet Take 25 mg by mouth daily.      . Tadalafil, PAH, 20 MG TABS Take 2 tablets (40 mg total) by mouth daily.  60 tablet  6  . torsemide (DEMADEX) 20 MG tablet Take 2 tabs in AM and 1 tab in PM  90 tablet  3  . traMADol (ULTRAM) 50 MG tablet TAKE 1 TABLET BY MOUTH TWICE DAILY AS NEEDED FOR PAIN  60 tablet  1  . warfarin (COUMADIN) 4 MG tablet Take as directed by coumadin clinic.  35 tablet  3   No current facility-administered medications for this encounter.    Filed Vitals:   12/12/13 0951  BP: 110/52  Pulse: 84  Weight: 161 lb (73.029 kg)  SpO2: 94%   PHYSICAL EXAM: General:  Elderly, well appearing. NAD.No resp difficulty; interpreter present HEENT: normal Neck: supple. JVD 6  Carotids 2+ bilaterally; no bruits. No lymphadenopathy or thryomegaly appreciated. Cor: PMI laterally displaced. irreg. No s3 or murmur.  Lungs: clear Abdomen: soft, nontender, non distended. No hepatosplenomegaly. No bruits or masses. Good bowel sounds. Extremities: no cyanosis, clubbing, rash. No ankle edema.  RUE PICC site ok Neuro: alert & orientedx3, cranial nerves grossly intact. Moves all 4 extremities w/o difficulty. Affect pleasant.   ASSESSMENT & PLAN:  1. Chronic systolic CHF: NICM, EF 25%  s/p CRT-D ; Translator Present  - Doing well. NYHA II-III. Volume status stable. Continue torsemide 40 mg in am and 20 mg in pm. Increase potassium to 20 meq twice a day.Continue milrinone. No bb due tlow output.  - Not on Ace-I due to rash.  - Reinforced the  need and importance of daily weights, a low sodium diet, and fluid restriction (less than 2 L a day) through interpreter. Instructed to call the HF clinic if weight increases more than 3 lbs overnight or 5 lbs in a week.  2.  Paroxysmal atrial fibrillation: Suspect this making his HF worse. Had LAA clot on TEE will consider DC-CV after 4 weeks of therapeutic INRS. Continue Coumadin. SO far he has been therapeutic.  3. Pulmonary arterial hypertension: Noted on RHC 02/21/13.  Continue n Adcirca 40 mg daily and milrinone. .  4. Acute on CKD: As above.  5. Retroperitoneal mass - benign by biopsy  Follow up 2 weeks. If INR therapeutic at next visit set up TEE-DC-CV    Tyler Sacra  NP-C   12/12/2013

## 2013-12-12 ENCOUNTER — Ambulatory Visit (HOSPITAL_COMMUNITY)
Admission: RE | Admit: 2013-12-12 | Discharge: 2013-12-12 | Disposition: A | Discharge status: Home / Self Care | Payer: Medicare Other | Source: Ambulatory Visit | Attending: Internal Medicine | Admitting: Internal Medicine

## 2013-12-12 VITALS — BP 110/52 | HR 84 | Wt 161.0 lb

## 2013-12-12 DIAGNOSIS — I4891 Unspecified atrial fibrillation: Secondary | ICD-10-CM

## 2013-12-12 DIAGNOSIS — Z8673 Personal history of transient ischemic attack (TIA), and cerebral infarction without residual deficits: Secondary | ICD-10-CM | POA: Diagnosis not present

## 2013-12-12 DIAGNOSIS — Z7901 Long term (current) use of anticoagulants: Secondary | ICD-10-CM | POA: Diagnosis not present

## 2013-12-12 DIAGNOSIS — I2789 Other specified pulmonary heart diseases: Secondary | ICD-10-CM

## 2013-12-12 DIAGNOSIS — N189 Chronic kidney disease, unspecified: Secondary | ICD-10-CM | POA: Insufficient documentation

## 2013-12-12 DIAGNOSIS — E7800 Pure hypercholesterolemia, unspecified: Secondary | ICD-10-CM | POA: Insufficient documentation

## 2013-12-12 DIAGNOSIS — I5022 Chronic systolic (congestive) heart failure: Secondary | ICD-10-CM | POA: Diagnosis not present

## 2013-12-12 DIAGNOSIS — I252 Old myocardial infarction: Secondary | ICD-10-CM | POA: Insufficient documentation

## 2013-12-12 DIAGNOSIS — I130 Hypertensive heart and chronic kidney disease with heart failure and stage 1 through stage 4 chronic kidney disease, or unspecified chronic kidney disease: Secondary | ICD-10-CM | POA: Diagnosis not present

## 2013-12-12 DIAGNOSIS — I509 Heart failure, unspecified: Secondary | ICD-10-CM | POA: Diagnosis not present

## 2013-12-12 DIAGNOSIS — I2721 Secondary pulmonary arterial hypertension: Secondary | ICD-10-CM

## 2013-12-12 DIAGNOSIS — R1909 Other intra-abdominal and pelvic swelling, mass and lump: Secondary | ICD-10-CM | POA: Diagnosis not present

## 2013-12-12 MED ORDER — POTASSIUM CHLORIDE CRYS ER 20 MEQ PO TBCR: 20.0000 meq | EXTENDED_RELEASE_TABLET | Freq: Two times a day (BID) | ORAL | Status: DC

## 2013-12-12 NOTE — Patient Instructions (Signed)
Follow up in 2 weeks  Take 20 meq Kdur twice a day   Do the following things EVERYDAY: 1) Weigh yourself in the morning before breakfast. Write it down and keep it in a log. 2) Take your medicines as prescribed 3) Eat low salt foods-Limit salt (sodium) to 2000 mg per day.  4) Stay as active as you can everyday 5) Limit all fluids for the day to less than 2 liters

## 2013-12-17 ENCOUNTER — Ambulatory Visit (INDEPENDENT_AMBULATORY_CARE_PROVIDER_SITE_OTHER): Payer: Medicare Other | Admitting: Cardiovascular Disease

## 2013-12-17 LAB — POCT INR: INR: 3.3

## 2013-12-24 ENCOUNTER — Ambulatory Visit (INDEPENDENT_AMBULATORY_CARE_PROVIDER_SITE_OTHER): Payer: Medicare Other | Admitting: Pharmacist

## 2013-12-24 DIAGNOSIS — I4891 Unspecified atrial fibrillation: Secondary | ICD-10-CM

## 2013-12-24 LAB — POCT INR: INR: 3.1

## 2013-12-26 ENCOUNTER — Encounter (HOSPITAL_COMMUNITY): Payer: Self-pay | Admitting: *Deleted

## 2013-12-26 ENCOUNTER — Other Ambulatory Visit: Payer: Self-pay | Admitting: *Deleted

## 2013-12-26 ENCOUNTER — Ambulatory Visit (HOSPITAL_BASED_OUTPATIENT_CLINIC_OR_DEPARTMENT_OTHER)
Admission: RE | Admit: 2013-12-26 | Discharge: 2013-12-26 | Disposition: A | Discharge status: Home / Self Care | Payer: Medicare Other | Source: Ambulatory Visit | Attending: Internal Medicine | Admitting: Internal Medicine

## 2013-12-26 VITALS — BP 104/52 | HR 88 | Wt 165.0 lb

## 2013-12-26 DIAGNOSIS — I5022 Chronic systolic (congestive) heart failure: Secondary | ICD-10-CM

## 2013-12-26 DIAGNOSIS — I4891 Unspecified atrial fibrillation: Secondary | ICD-10-CM | POA: Diagnosis not present

## 2013-12-26 DIAGNOSIS — I2789 Other specified pulmonary heart diseases: Secondary | ICD-10-CM

## 2013-12-26 DIAGNOSIS — G40909 Epilepsy, unspecified, not intractable, without status epilepticus: Secondary | ICD-10-CM | POA: Diagnosis not present

## 2013-12-26 DIAGNOSIS — I447 Left bundle-branch block, unspecified: Secondary | ICD-10-CM | POA: Diagnosis not present

## 2013-12-26 DIAGNOSIS — Z79899 Other long term (current) drug therapy: Secondary | ICD-10-CM | POA: Diagnosis not present

## 2013-12-26 DIAGNOSIS — N189 Chronic kidney disease, unspecified: Secondary | ICD-10-CM | POA: Diagnosis not present

## 2013-12-26 DIAGNOSIS — I509 Heart failure, unspecified: Secondary | ICD-10-CM | POA: Diagnosis not present

## 2013-12-26 DIAGNOSIS — Z7901 Long term (current) use of anticoagulants: Secondary | ICD-10-CM | POA: Diagnosis not present

## 2013-12-26 DIAGNOSIS — I4892 Unspecified atrial flutter: Secondary | ICD-10-CM | POA: Diagnosis present

## 2013-12-26 DIAGNOSIS — I428 Other cardiomyopathies: Secondary | ICD-10-CM | POA: Diagnosis not present

## 2013-12-26 DIAGNOSIS — N179 Acute kidney failure, unspecified: Secondary | ICD-10-CM | POA: Diagnosis not present

## 2013-12-26 DIAGNOSIS — I2721 Secondary pulmonary arterial hypertension: Secondary | ICD-10-CM

## 2013-12-26 DIAGNOSIS — E785 Hyperlipidemia, unspecified: Secondary | ICD-10-CM | POA: Diagnosis not present

## 2013-12-26 DIAGNOSIS — I252 Old myocardial infarction: Secondary | ICD-10-CM | POA: Diagnosis not present

## 2013-12-26 DIAGNOSIS — Z8673 Personal history of transient ischemic attack (TIA), and cerebral infarction without residual deficits: Secondary | ICD-10-CM | POA: Diagnosis not present

## 2013-12-26 DIAGNOSIS — I129 Hypertensive chronic kidney disease with stage 1 through stage 4 chronic kidney disease, or unspecified chronic kidney disease: Secondary | ICD-10-CM | POA: Diagnosis not present

## 2013-12-26 MED ORDER — TORSEMIDE 20 MG PO TABS: 40.0000 mg | ORAL_TABLET | Freq: Every day | ORAL | Status: DC

## 2013-12-26 MED ORDER — TORSEMIDE 20 MG PO TABS: 20.0000 mg | ORAL_TABLET | Freq: Two times a day (BID) | ORAL | Status: DC

## 2013-12-26 NOTE — Addendum Note (Signed)
Encounter addended by: Scarlette Calico, RN on: 12/26/2013 11:19 AM<BR>     Documentation filed: Patient Instructions Section, Orders

## 2013-12-26 NOTE — Addendum Note (Signed)
Encounter addended by: Scarlette Calico, RN on: 12/26/2013 11:11 AM<BR>     Documentation filed: Demographics Visit, Orders

## 2013-12-26 NOTE — Progress Notes (Signed)
Patient ID: Tyler Christensen, male   DOB: [birth year], 80 y.o.   MRN: [id number]  HPI: Tyler Christensen is a 80 y.o. [name] (non-english speaking) w/ PMHx significant for chronic systolic CHF (EF 08%), NICM, LBBB, PAF (on coumadin), HTN, HLD, CVA, Pulmonary Hypertension and h/o seizure d/o (2/2 CVA 05/2012).   Admitted to [facility] 06/2012 with acute decompensated heart failure EF 20%. As noted below, also diagnosed with severe pulmonary hypertension (PAPs ~ 90) from initial RHC and placed on Milrinone and Revatio (eventually transitioned to tadalafil 40) with marked clinical response and decrease in PA pressures. VQ low probability for pulmonary embolus.  Discharged on Milrinone at 0.375 mcg via PICC. Discharge Weight 146 pounds. Attempted to wean milrinone in 9/14 but failed due to worsening HF and PAH. Milrinone restarted.  Admitted to the hospital 5/21-11/22/13. Found to have staph aureus bacteremia treated with IV abx. TEE negative for endocarditis. Also found to retroperitoneal mass and biopsy revealed benign lesion. He had evidence of low output thought to be due to atrial fibrillation and milrinone increased. Attempted TEE and DC-CV but had LAA clot so deferred cardioversion.  Follow up for Heart Failure: Last visit increased potassium to 20 meq BID. Feeling great. Denies SOB, PND or CP. +orthopnea (3 pillows). On days that he feels well like today he can walk about 10-15 minutes. Weight at home 165 lbs. Following low salt diet and drinking less than 2L a day. AHC following for home milrinone. No bleeding issues. Took metolazone last week - takes about 1x/week  NO ACE-I 2/2 rash   08/14/12 S/P successful DC-CV of AF 10/26/12 S/P BiV-ICD implant 09/28/12 ECHO EF 15% 01/07/13 ECHO EF 20%, restrictive diastolic function, mildly decreased RV size and systolic fxn, mild to moderate MR, PA systolic pressure 59 mmHg  02/21/13 ECHO EF 20% PA pressure mean 59   RHC 02/21/13 (off milrinone) RA = 9 with v-waves to 20  RV =  72/8/20  PA = 69/27 (44)  PCW = 18-20  Fick cardiac output/index = 2.7/1.5  PVR = 9.6 woods  FA sat = 96%  PA sat = 48%, 52%  Labs      (06/25/13) K 4.2 Creatinine 1.58 Magnesium 2.0     (07/16/13) K 4.2, Cr 1.67    (2/15) creatinine 1.97 => 1.94, K 4.6, HCT 39.4    (4/15) K 4.5, creatinine 1.56, pro-BNP 6912    (4/15) K 3.9, creatinine 2.39, mag 2.1     (09/2013) K 3.7, creatinine 2.03     11/26/13 K 4.6 cr 2.8 hgb 13.2     12/11/13 K 3.4 Creatinine 2.14  Magnesium 2.1     12/19/13: K+ 3.4, creatinine 2.42, BUN 57    SH: Lives with his wife here in [place].   ROS: All systems negative except as listed in HPI, PMH and Problem List.  Past Medical History  Diagnosis Date  . HTN (hypertension)   . High cholesterol   . CHF (congestive heart failure)   . BBBB (bilateral bundle branch block)   . LBBB (left bundle branch block)   . Weakness   . Tired   . Seizures 06/11/2012    new onset/notes (06/11/2012)  . SOB (shortness of breath)     "sometimes when I lay down;; related to not taking my RX" (06/11/2012)  . Myocardial infarction     06/10/2012  . Atrial fibrillation   . Stroke   . Atrial thrombus     left  . Pulmonary HTN  Current Outpatient Prescriptions  Medication Sig Dispense Refill  . amiodarone (PACERONE) 200 MG tablet Take 1 tablet (200 mg total) by mouth 2 (two) times daily.  60 tablet  6  . Choline Fenofibrate (FENOFIBRIC ACID) 45 MG CPDR TAKE 1 CAPSULE BY MOUTH EVERY DAY  30 capsule  3  . loratadine (CLARITIN) 10 MG tablet TAKE 1 TABLET BY MOUTH DAILY TAKE 1 TABLET BY MOUTH DAILY  30 tablet  6  . losartan (COZAAR) 25 MG tablet Take 12.5 mg by mouth 2 (two) times daily.      . magnesium oxide (MAG-OX) 400 MG tablet Take 400 mg by mouth daily.      . metolazone (ZAROXOLYN) 2.5 MG tablet Take 1 tablet (2.5 mg total) by mouth as needed.  10 tablet  3  . milrinone (PRIMACOR) 20 MG/100ML SOLN infusion Inject 26.775 mcg/min into the vein continuous.  100 mL  6  .  potassium chloride SA (K-DUR,KLOR-CON) 20 MEQ tablet Take 1 tablet (20 mEq total) by mouth 2 (two) times daily. TAKE EXTRA TAB WHEN YOU TAKE METOLAZONE  60 tablet  3  . simvastatin (ZOCOR) 40 MG tablet Take 40 mg by mouth every evening.      Marland Kitchen spironolactone (ALDACTONE) 25 MG tablet Take 25 mg by mouth daily.      . Tadalafil, PAH, 20 MG TABS Take 2 tablets (40 mg total) by mouth daily.  60 tablet  6  . torsemide (DEMADEX) 20 MG tablet Take 2 tabs in AM and 1 tab in PM  90 tablet  3  . traMADol (ULTRAM) 50 MG tablet TAKE 1 TABLET BY MOUTH TWICE DAILY AS NEEDED FOR PAIN  60 tablet  1  . warfarin (COUMADIN) 4 MG tablet Take as directed by coumadin clinic.  35 tablet  3   No current facility-administered medications for this encounter.    Filed Vitals:   12/26/13 1017  BP: 104/52  Pulse: 88  Weight: 165 lb (74.844 kg)  SpO2: 95%   PHYSICAL EXAM: General:  Elderly, well appearing. NAD.No resp difficulty; interpreter present HEENT: normal Neck: supple. JVD 6  Carotids 2+ bilaterally; no bruits. No lymphadenopathy or thryomegaly appreciated. Cor: PMI laterally displaced. irreg. No s3 or murmur.  Lungs: clear Abdomen: soft, nontender, non distended. No hepatosplenomegaly. No bruits or masses. Good bowel sounds. Extremities: no cyanosis, clubbing, rash. No ankle edema.  RUE PICC site ok Neuro: alert & orientedx3, cranial nerves grossly intact. Moves all 4 extremities w/o difficulty. Affect pleasant.  ECG. AFL with v pacing  ASSESSMENT & PLAN:  1. Chronic systolic CHF: NICM, EF 21%  s/p CRT-D ; Translator Present  - Doing well. NYHA II-III. Volume status stable. Renal function getting slightly worse. Decrease torsemide to 20 bid. Can take xtra as needed. Watch renal function.  Continue milrinone. No bb due to low output.  - Not on Ace-I due to rash.  - Reinforced the need and importance of daily weights, a low sodium diet, and fluid restriction (less than 2 L a day) through interpreter.  Instructed to call the HF clinic if weight increases more than 3 lbs overnight or 5 lbs in a week.  2. Paroxysmal atrial fibrillation/flutter: Suspect this making his HF worse. Had LAA clot on TEE. INR now therapeutic > 4 weeks. Plan DC-CV tomorrow.  3. Pulmonary arterial hypertension: Noted on RHC 02/21/13.  Continue Adcirca 40 mg daily and milrinone. .  4. Acute on CKD: As above.  5. Retroperitoneal mass - benign by  biopsy  Tyler Brunt  NP-C   12/26/2013  Patient seen and examined with Tyler Bame, NP. We discussed all aspects of the encounter. I agree with the assessment and plan as stated above.   Doing well. Renal function slightly worse. Agree with cutting back torsemide. We discussed DC-CV we will arrange for tomorrow.   Analysa Nutting,MD 11:06 AM

## 2013-12-26 NOTE — Patient Instructions (Signed)
Decrease Furosemide to 40 mg (2 tabs) daily  Your physician has recommended that you have a Cardioversion (DCCV). Electrical Cardioversion uses a jolt of electricity to your heart either through paddles or wired patches attached to your chest. This is a controlled, usually prescheduled, procedure. Defibrillation is done under light anesthesia in the hospital, and you usually go home the day of the procedure. This is done to get your heart back into a normal rhythm. You are not awake for the procedure. Please see the instruction sheet given to you today.  Your physician recommends that you schedule a follow-up appointment in: 3 weeks

## 2013-12-27 ENCOUNTER — Ambulatory Visit (HOSPITAL_COMMUNITY): Payer: Medicare Other | Admitting: Certified Registered"

## 2013-12-27 ENCOUNTER — Encounter (HOSPITAL_COMMUNITY): Payer: Medicare Other | Admitting: Certified Registered"

## 2013-12-27 ENCOUNTER — Encounter (HOSPITAL_COMMUNITY): Payer: Self-pay | Admitting: *Deleted

## 2013-12-27 ENCOUNTER — Ambulatory Visit (HOSPITAL_COMMUNITY)
Admission: RE | Admit: 2013-12-27 | Discharge: 2013-12-27 | Disposition: A | Discharge status: Home / Self Care | Payer: Medicare Other | Source: Ambulatory Visit | Attending: Internal Medicine | Admitting: Internal Medicine

## 2013-12-27 ENCOUNTER — Encounter (HOSPITAL_COMMUNITY)
Admission: RE | Disposition: A | Discharge status: Home / Self Care | Payer: Self-pay | Source: Ambulatory Visit | Attending: Internal Medicine

## 2013-12-27 DIAGNOSIS — I428 Other cardiomyopathies: Secondary | ICD-10-CM | POA: Insufficient documentation

## 2013-12-27 DIAGNOSIS — I4892 Unspecified atrial flutter: Secondary | ICD-10-CM | POA: Insufficient documentation

## 2013-12-27 DIAGNOSIS — Z8673 Personal history of transient ischemic attack (TIA), and cerebral infarction without residual deficits: Secondary | ICD-10-CM | POA: Insufficient documentation

## 2013-12-27 DIAGNOSIS — Z79899 Other long term (current) drug therapy: Secondary | ICD-10-CM | POA: Insufficient documentation

## 2013-12-27 DIAGNOSIS — N189 Chronic kidney disease, unspecified: Secondary | ICD-10-CM | POA: Insufficient documentation

## 2013-12-27 DIAGNOSIS — I5022 Chronic systolic (congestive) heart failure: Secondary | ICD-10-CM | POA: Insufficient documentation

## 2013-12-27 DIAGNOSIS — N179 Acute kidney failure, unspecified: Secondary | ICD-10-CM | POA: Insufficient documentation

## 2013-12-27 DIAGNOSIS — I480 Paroxysmal atrial fibrillation: Secondary | ICD-10-CM

## 2013-12-27 DIAGNOSIS — I2789 Other specified pulmonary heart diseases: Secondary | ICD-10-CM | POA: Insufficient documentation

## 2013-12-27 DIAGNOSIS — Z7901 Long term (current) use of anticoagulants: Secondary | ICD-10-CM | POA: Insufficient documentation

## 2013-12-27 DIAGNOSIS — I447 Left bundle-branch block, unspecified: Secondary | ICD-10-CM | POA: Insufficient documentation

## 2013-12-27 DIAGNOSIS — I509 Heart failure, unspecified: Secondary | ICD-10-CM | POA: Insufficient documentation

## 2013-12-27 DIAGNOSIS — I129 Hypertensive chronic kidney disease with stage 1 through stage 4 chronic kidney disease, or unspecified chronic kidney disease: Secondary | ICD-10-CM | POA: Insufficient documentation

## 2013-12-27 DIAGNOSIS — I4891 Unspecified atrial fibrillation: Secondary | ICD-10-CM | POA: Insufficient documentation

## 2013-12-27 DIAGNOSIS — G40909 Epilepsy, unspecified, not intractable, without status epilepticus: Secondary | ICD-10-CM | POA: Insufficient documentation

## 2013-12-27 DIAGNOSIS — E785 Hyperlipidemia, unspecified: Secondary | ICD-10-CM | POA: Insufficient documentation

## 2013-12-27 DIAGNOSIS — I252 Old myocardial infarction: Secondary | ICD-10-CM | POA: Insufficient documentation

## 2013-12-27 HISTORY — PX: CARDIOVERSION: SHX1299

## 2013-12-27 LAB — POCT I-STAT 4, (NA,K, GLUC, HGB,HCT)
Glucose, Bld: 108 mg/dL — ABNORMAL HIGH (ref 70–99)
HCT: 41 % (ref 39.0–52.0)
Hemoglobin: 13.9 g/dL (ref 13.0–17.0)
Potassium: 3.8 mEq/L (ref 3.7–5.3)
SODIUM: 137 meq/L (ref 137–147)

## 2013-12-27 SURGERY — CARDIOVERSION
Anesthesia: Monitor Anesthesia Care

## 2013-12-27 MED ORDER — SODIUM CHLORIDE 0.9 % IV SOLN: Freq: Once | INTRAVENOUS | Status: DC

## 2013-12-27 MED ORDER — ETOMIDATE 2 MG/ML IV SOLN
INTRAVENOUS | Status: DC | PRN
  Administered 2013-12-27: 10 mg via INTRAVENOUS

## 2013-12-27 NOTE — Discharge Instructions (Signed)
Cardioversin elctrica Magazine features editor) La cardioversin elctrica es la aplicacin de un impacto elctrico para modificar el ritmo cardaco. Se colocan unos parches con pegamento o placas metlicas sobre el pecho para enviar electricidad desde un dispositivo. Esto se utiliza para restablecer el ritmo cardaco normal. Un ritmo demasiado rpido o irregular impide que el corazn bombee [employer]. La cardioversin elctrica se realiza en una emergencia si:   Como resultado del ritmo cardaco la presin arterial es baja o no hay presin.  El ritmo normal debe restablecerse lo antes posible para proteger al cerebro y al corazn de un dao ms importante.  Puede salvar la vida. La cardioversin puede realizarse en un ritmo cardaco que no pone inmediatamente en peligro la vida como en la fibrilacin auricular o en el flutter si:   El corazn late demasiado rpido o de manera irregular.  Los medicamentos para cambiar el ritmo cardaco no [employer].  Es seguro esperar para permitir un tiempo de preparacin.  Los sntomas de ritmo cardaco anormal son molestos.  El riesgo de ictus u otras complicaciones graves puede reducirse. INFORME A SU MDICO SOBRE:   [employer] que Alpha, incluyendo vitaminas, hierbas, gotas oftlmicas, cremas y medicamentos de venta libre.  Problemas previos que usted o los [employer] de su familia hayan tenido con el uso de anestsicos.  Enfermedades de [employer].  Cirugas previas.  Padecimientos mdicos. RIESGOS Y COMPLICACIONES  Generalmente es un procedimiento seguro. Sin embargo, Games developer procedimiento, pueden surgir complicaciones. Las complicaciones posibles son:   Problemas respiratorios relacionados con la anestesia.  Paro cardaco: este es un riesgo raro.  Un cogulo de [employer] se desprende y [place] hacia otras partes del cuerpo. Esto puede causar un ictus u otros problemas. Este riesgo est disminuido por el  uso de medicamentos que licuan la sangre (anticoagulantes) antes del procedimiento. ANTES DEL PROCEDIMIENTO   Le harn estudios para detectar cogulos sanguneos en el corazn y evaluar la funcin cardaca.  Podrn indicarle que comience a tomar anticoagulantes para que su sangre no forme cogulos fcilmente.  Le darn medicamentos para estabilizar su frecuencia y ritmo cardaco. PROCEDIMIENTO  Le administrarn un medicamento a travs de una va IV para reducir las molestias y Nature conservation officer dormir (sedante).  Se enviar una descarga elctrica. DESPUS DEL PROCEDIMIENTO El ritmo cardaco se observar para asegurarse de que no cambia. Podr volver a su casa luego de algunas horas.  Document Released: 06/06/2005 Document Revised: 03/27/2013 [facility] Patient Information 2015 [place], [place]. This information is not intended to replace advice given to you by your health care provider. Make sure you discuss any questions you have with your health care provider.

## 2013-12-27 NOTE — Anesthesia Postprocedure Evaluation (Signed)
  Anesthesia Post-op Note  Patient: Tyler Christensen  Procedure(s) Performed: Procedure(s): CARDIOVERSION (N/A)  Patient Location: Endoscopy Unit  Anesthesia Type:General  Level of Consciousness: awake  Airway and Oxygen Therapy: Patient Spontanous Breathing and Patient connected to nasal cannula oxygen  Post-op Pain: none  Post-op Assessment: Post-op Vital signs reviewed  Post-op Vital Signs: Reviewed and stable  Last Vitals:  Filed Vitals:   12/27/13 1329  BP: 131/72  Temp: 36.4 C  Resp: 25    Complications: No apparent anesthesia complications

## 2013-12-27 NOTE — H&P (View-Only) (Signed)
Patient ID: Tyler Christensen, male   DOB: 22-Aug-1932, 80 y.o.   MRN: [id number]  HPI: Tyler Christensen is a 80 y.o. [name] (non-english speaking) w/ PMHx significant for chronic systolic CHF (EF 08%), NICM, LBBB, PAF (on coumadin), HTN, HLD, CVA, Pulmonary Hypertension and h/o seizure d/o (2/2 CVA 05/2012).   Admitted to [facility] 06/2012 with acute decompensated heart failure EF 20%. As noted below, also diagnosed with severe pulmonary hypertension (PAPs ~ 90) from initial RHC and placed on Milrinone and Revatio (eventually transitioned to tadalafil 40) with marked clinical response and decrease in PA pressures. VQ low probability for pulmonary embolus.  Discharged on Milrinone at 0.375 mcg via PICC. Discharge Weight 146 pounds. Attempted to wean milrinone in 9/14 but failed due to worsening HF and PAH. Milrinone restarted.  Admitted to the hospital 5/21-11/22/13. Found to have staph aureus bacteremia treated with IV abx. TEE negative for endocarditis. Also found to retroperitoneal mass and biopsy revealed benign lesion. He had evidence of low output thought to be due to atrial fibrillation and milrinone increased. Attempted TEE and DC-CV but had LAA clot so deferred cardioversion.  Follow up for Heart Failure: Last visit increased potassium to 20 meq BID. Feeling great. Denies SOB, PND or CP. +orthopnea (3 pillows). On days that he feels well like today he can walk about 10-15 minutes. Weight at home 165 lbs. Following low salt diet and drinking less than 2L a day. AHC following for home milrinone. No bleeding issues. Took metolazone last week - takes about 1x/week  NO ACE-I 2/2 rash   08/14/12 S/P successful DC-CV of AF 10/26/12 S/P BiV-ICD implant 09/28/12 ECHO EF 15% 01/07/13 ECHO EF 20%, restrictive diastolic function, mildly decreased RV size and systolic fxn, mild to moderate MR, PA systolic pressure 59 mmHg  02/21/13 ECHO EF 20% PA pressure mean 59   RHC 02/21/13 (off milrinone) RA = 9 with v-waves to 20  RV =  72/8/20  PA = 69/27 (44)  PCW = 18-20  Fick cardiac output/index = 2.7/1.5  PVR = 9.6 woods  FA sat = 96%  PA sat = 48%, 52%  Labs      (06/25/13) K 4.2 Creatinine 1.58 Magnesium 2.0     (07/16/13) K 4.2, Cr 1.67    (2/15) creatinine 1.97 => 1.94, K 4.6, HCT 39.4    (4/15) K 4.5, creatinine 1.56, pro-BNP 6912    (4/15) K 3.9, creatinine 2.39, mag 2.1     (09/2013) K 3.7, creatinine 2.03     11/26/13 K 4.6 cr 2.8 hgb 13.2     12/11/13 K 3.4 Creatinine 2.14  Magnesium 2.1     12/19/13: K+ 3.4, creatinine 2.42, BUN 57    SH: Lives with his wife here in [place].   ROS: All systems negative except as listed in HPI, PMH and Problem List.  Past Medical History  Diagnosis Date  . HTN (hypertension)   . High cholesterol   . CHF (congestive heart failure)   . BBBB (bilateral bundle branch block)   . LBBB (left bundle branch block)   . Weakness   . Tired   . Seizures 06/11/2012    new onset/notes (06/11/2012)  . SOB (shortness of breath)     "sometimes when I lay down;; related to not taking my RX" (06/11/2012)  . Myocardial infarction     06/10/2012  . Atrial fibrillation   . Stroke   . Atrial thrombus     left  . Pulmonary HTN  Current Outpatient Prescriptions  Medication Sig Dispense Refill  . amiodarone (PACERONE) 200 MG tablet Take 1 tablet (200 mg total) by mouth 2 (two) times daily.  60 tablet  6  . Choline Fenofibrate (FENOFIBRIC ACID) 45 MG CPDR TAKE 1 CAPSULE BY MOUTH EVERY DAY  30 capsule  3  . loratadine (CLARITIN) 10 MG tablet TAKE 1 TABLET BY MOUTH DAILY TAKE 1 TABLET BY MOUTH DAILY  30 tablet  6  . losartan (COZAAR) 25 MG tablet Take 12.5 mg by mouth 2 (two) times daily.      . magnesium oxide (MAG-OX) 400 MG tablet Take 400 mg by mouth daily.      . metolazone (ZAROXOLYN) 2.5 MG tablet Take 1 tablet (2.5 mg total) by mouth as needed.  10 tablet  3  . milrinone (PRIMACOR) 20 MG/100ML SOLN infusion Inject 26.775 mcg/min into the vein continuous.  100 mL  6  .  potassium chloride SA (K-DUR,KLOR-CON) 20 MEQ tablet Take 1 tablet (20 mEq total) by mouth 2 (two) times daily. TAKE EXTRA TAB WHEN YOU TAKE METOLAZONE  60 tablet  3  . simvastatin (ZOCOR) 40 MG tablet Take 40 mg by mouth every evening.      Marland Kitchen spironolactone (ALDACTONE) 25 MG tablet Take 25 mg by mouth daily.      . Tadalafil, PAH, 20 MG TABS Take 2 tablets (40 mg total) by mouth daily.  60 tablet  6  . torsemide (DEMADEX) 20 MG tablet Take 2 tabs in AM and 1 tab in PM  90 tablet  3  . traMADol (ULTRAM) 50 MG tablet TAKE 1 TABLET BY MOUTH TWICE DAILY AS NEEDED FOR PAIN  60 tablet  1  . warfarin (COUMADIN) 4 MG tablet Take as directed by coumadin clinic.  35 tablet  3   No current facility-administered medications for this encounter.    Filed Vitals:   12/26/13 1017  BP: 104/52  Pulse: 88  Weight: 165 lb (74.844 kg)  SpO2: 95%   PHYSICAL EXAM: General:  Elderly, well appearing. NAD.No resp difficulty; interpreter present HEENT: normal Neck: supple. JVD 6  Carotids 2+ bilaterally; no bruits. No lymphadenopathy or thryomegaly appreciated. Cor: PMI laterally displaced. irreg. No s3 or murmur.  Lungs: clear Abdomen: soft, nontender, non distended. No hepatosplenomegaly. No bruits or masses. Good bowel sounds. Extremities: no cyanosis, clubbing, rash. No ankle edema.  RUE PICC site ok Neuro: alert & orientedx3, cranial nerves grossly intact. Moves all 4 extremities w/o difficulty. Affect pleasant.  ECG. AFL with v pacing  ASSESSMENT & PLAN:  1. Chronic systolic CHF: NICM, EF 95%  s/p CRT-D ; Translator Present  - Doing well. NYHA II-III. Volume status stable. Renal function getting slightly worse. Decrease torsemide to 20 bid. Can take xtra as needed. Watch renal function.  Continue milrinone. No bb due to low output.  - Not on Ace-I due to rash.  - Reinforced the need and importance of daily weights, a low sodium diet, and fluid restriction (less than 2 L a day) through interpreter.  Instructed to call the HF clinic if weight increases more than 3 lbs overnight or 5 lbs in a week.  2. Paroxysmal atrial fibrillation/flutter: Suspect this making his HF worse. Had LAA clot on TEE. INR now therapeutic > 4 weeks. Plan DC-CV tomorrow.  3. Pulmonary arterial hypertension: Noted on RHC 02/21/13.  Continue Adcirca 40 mg daily and milrinone. .  4. Acute on CKD: As above.  5. Retroperitoneal mass - benign by  biopsy  Rande Brunt  NP-C   12/26/2013  Patient seen and examined with Junie Bame, NP. We discussed all aspects of the encounter. I agree with the assessment and plan as stated above.   Doing well. Renal function slightly worse. Agree with cutting back torsemide. We discussed DC-CV we will arrange for tomorrow.   Daniel Bensimhon,MD 11:06 AM

## 2013-12-27 NOTE — Transfer of Care (Signed)
Immediate Anesthesia Transfer of Care Note  Patient: Tyler Christensen  Procedure(s) Performed: Procedure(s): CARDIOVERSION (N/A)  Patient Location: endo  Anesthesia Type:General  Level of Consciousness: awake  Airway & Oxygen Therapy: Patient Spontanous Breathing and Patient connected to nasal cannula oxygen  Post-op Assessment: Report given to PACU RN and Post -op Vital signs reviewed and stable  Post vital signs: Reviewed and stable  Complications: No apparent anesthesia complications

## 2013-12-27 NOTE — Progress Notes (Signed)
Interpreter Lesle Chris endoscopy

## 2013-12-27 NOTE — Addendum Note (Signed)
Encounter addended by: Asencion Gowda, CCT on: 12/27/2013 10:15 AM<BR>     Documentation filed: Charges VN

## 2013-12-27 NOTE — Anesthesia Preprocedure Evaluation (Addendum)
Anesthesia Evaluation  Patient identified by MRN, date of birth, ID band Patient awake    Reviewed: Allergy & Precautions, H&P , NPO status , Patient's Chart, lab work & pertinent test results  Airway Mallampati: I TM Distance: >3 FB Neck ROM: Full    Dental  (+) Edentulous Lower   Pulmonary          Cardiovascular hypertension, Pt. on medications + Past MI and +CHF + dysrhythmias Atrial Fibrillation     Neuro/Psych    GI/Hepatic   Endo/Other    Renal/GU      Musculoskeletal   Abdominal   Peds  Hematology   Anesthesia Other Findings   Reproductive/Obstetrics                          Anesthesia Physical Anesthesia Plan  ASA: III  Anesthesia Plan: General   Post-op Pain Management:    Induction: Intravenous  Airway Management Planned: Mask  Additional Equipment:   Intra-op Plan:   Post-operative Plan:   Informed Consent: I have reviewed the patients History and Physical, chart, labs and discussed the procedure including the risks, benefits and alternatives for the proposed anesthesia with the patient or authorized representative who has indicated his/her understanding and acceptance.     Plan Discussed with: CRNA and Surgeon  Anesthesia Plan Comments:         Anesthesia Quick Evaluation

## 2013-12-27 NOTE — Interval H&P Note (Signed)
History and Physical Interval Note:  12/27/2013 1:55 PM  Tyler Christensen  has presented today for surgery, with the diagnosis of afib/AFL The various methods of treatment have been discussed with the patient and family. After consideration of risks, benefits and other options for treatment, the patient has consented to  Procedure(s): CARDIOVERSION (N/A) as a surgical intervention .  The patient's history has been reviewed, patient examined, no change in status, stable for surgery.  I have reviewed the patient's chart and labs.  Questions were answered to the patient's satisfaction.     Tyrees Chopin

## 2013-12-27 NOTE — CV Procedure (Signed)
     DIRECT CURRENT CARDIOVERSION  NAME:  Tyler Christensen   MRN: [id number] DOB:  1932/10/16   ADMIT DATE: 12/27/2013   INDICATIONS: Atrial flutter   PROCEDURE:   Informed consent was obtained prior to the procedure. The risks, benefits and alternatives for the procedure were discussed and the patient comprehended these risks. Once an appropriate time out was taken, the patient had the defibrillator pads placed in the anterior and posterior position. The patient then underwent sedation by the anesthesia service. Once an appropriate level of sedation was achieved, the patient received a single biphasic, synchronized 150J shock with prompt conversion to sinus rhythm. No apparent complications.   Jovanka Westgate,MD 1:56 PM

## 2013-12-30 ENCOUNTER — Encounter (HOSPITAL_COMMUNITY): Payer: Self-pay | Admitting: Internal Medicine

## 2013-12-31 ENCOUNTER — Telehealth (HOSPITAL_COMMUNITY): Payer: Self-pay | Admitting: Vascular Surgery

## 2013-12-31 ENCOUNTER — Other Ambulatory Visit (HOSPITAL_COMMUNITY): Payer: Self-pay | Admitting: Internal Medicine

## 2014-01-01 ENCOUNTER — Other Ambulatory Visit: Payer: Self-pay | Admitting: Internal Medicine

## 2014-01-04 DIAGNOSIS — I4891 Unspecified atrial fibrillation: Secondary | ICD-10-CM

## 2014-01-04 DIAGNOSIS — Z452 Encounter for adjustment and management of vascular access device: Secondary | ICD-10-CM

## 2014-01-04 DIAGNOSIS — I509 Heart failure, unspecified: Secondary | ICD-10-CM

## 2014-01-04 DIAGNOSIS — I5023 Acute on chronic systolic (congestive) heart failure: Secondary | ICD-10-CM

## 2014-01-07 ENCOUNTER — Ambulatory Visit (INDEPENDENT_AMBULATORY_CARE_PROVIDER_SITE_OTHER): Payer: Medicare Other | Admitting: Cardiology

## 2014-01-07 DIAGNOSIS — I4891 Unspecified atrial fibrillation: Secondary | ICD-10-CM

## 2014-01-07 LAB — POCT INR: INR: 2.6

## 2014-01-08 ENCOUNTER — Encounter: Payer: Self-pay | Admitting: *Deleted

## 2014-01-10 ENCOUNTER — Other Ambulatory Visit (HOSPITAL_COMMUNITY): Payer: Self-pay | Admitting: Adult Health

## 2014-01-14 NOTE — Progress Notes (Signed)
Patient ID: Amedeo Detweiler, male   DOB: Oct 04, 1932, 80 y.o.   MRN: [id number]  HPI: Mr. Kresse is a 80 y.o. [name] (non-english speaking) w/ PMHx significant for chronic systolic CHF (EF 93%), NICM, LBBB, PAF (on coumadin), HTN, HLD, CVA, Pulmonary Hypertension and h/o seizure d/o (2/2 CVA 05/2012).   Admitted to [facility] 06/2012 with acute decompensated heart failure EF 20%. As noted below, also diagnosed with severe pulmonary hypertension (PAPs ~ 90) from initial RHC and placed on Milrinone and Revatio (eventually transitioned to tadalafil 40) with marked clinical response and decrease in PA pressures. VQ low probability for pulmonary embolus.  Discharged on Milrinone at 0.375 mcg via PICC. Discharge Weight 146 pounds. Attempted to wean milrinone in 9/14 but failed due to worsening HF and PAH. Milrinone restarted.  Admitted to the hospital 5/21-11/22/13. Found to have staph aureus bacteremia treated with IV abx. TEE negative for endocarditis. Also found to retroperitoneal mass and biopsy revealed benign lesion. He had evidence of low output thought to be due to atrial fibrillation and milrinone increased. Attempted TEE and DC-CV but had LAA clot so deferred cardioversion.  Follow up for Heart Failure: Since last visit underwent DC-CV with successful cardioversion to NSR. Doing well. Denies SOB, orthopnea or CP. Denies heart palpitations. Weight at home 159-162 lbs. Following a low salt diet and drinking less than 2L a day. AHC following with milrinone. Walking about 15 minutes a day with no issues and able to go upstairs.   NO ACE-I 2/2 rash   08/14/12 S/P successful DC-CV of AF 10/26/12 S/P BiV-ICD implant 09/28/12 ECHO EF 15% 01/07/13 ECHO EF 20%, restrictive diastolic function, mildly decreased RV size and systolic fxn, mild to moderate MR, PA systolic pressure 59 mmHg  02/21/13 ECHO EF 20% PA pressure mean 59 11/12/13: ECHO EF 20-25%, trivial AI,     RHC 02/21/13 (off milrinone) RA = 9 with v-waves to 20  RV  = 72/8/20  PA = 69/27 (44)  PCW = 18-20  Fick cardiac output/index = 2.7/1.5  PVR = 9.6 woods  FA sat = 96%  PA sat = 48%, 52%  Labs      (06/25/13) K 4.2 Creatinine 1.58 Magnesium 2.0     (07/16/13) K 4.2, Cr 1.67    (2/15) creatinine 1.97 => 1.94, K 4.6, HCT 39.4    (4/15) K 4.5, creatinine 1.56, pro-BNP 6912    (4/15) K 3.9, creatinine 2.39, mag 2.1     (09/2013) K 3.7, creatinine 2.03     11/26/13 K 4.6 cr 2.8 hgb 13.2     12/11/13 K 3.4 Creatinine 2.14  Magnesium 2.1     12/19/13: K+ 3.4, creatinine 2.42, BUN 57     01/14/14: K 3.8, creatinine 2.45, BUN 45, mag 2.1    SH: Lives with his wife here in [place].   ROS: All systems negative except as listed in HPI, PMH and Problem List.  Past Medical History  Diagnosis Date  . HTN (hypertension)   . High cholesterol   . CHF (congestive heart failure)   . BBBB (bilateral bundle branch block)   . LBBB (left bundle branch block)   . Weakness   . Tired   . Seizures 06/11/2012    new onset/notes (06/11/2012)  . SOB (shortness of breath)     "sometimes when I lay down;; related to not taking my RX" (06/11/2012)  . Myocardial infarction     06/10/2012  . Atrial fibrillation   . Stroke   .  Atrial thrombus     left  . Pulmonary HTN     Current Outpatient Prescriptions  Medication Sig Dispense Refill  . amiodarone (PACERONE) 200 MG tablet Take 1 tablet (200 mg total) by mouth 2 (two) times daily.  60 tablet  6  . Choline Fenofibrate 45 MG capsule Take 45 mg by mouth daily.      Marland Kitchen loratadine (CLARITIN) 10 MG tablet Take 10 mg by mouth daily.      Marland Kitchen losartan (COZAAR) 25 MG tablet Take 12.5 mg by mouth 2 (two) times daily.      . magnesium oxide (MAG-OX) 400 MG tablet Take 400 mg by mouth daily.      . metolazone (ZAROXOLYN) 2.5 MG tablet TAKE 1 TABLET BY MOUTH AS NEEDED  10 tablet  3  . milrinone (PRIMACOR) 20 MG/100ML SOLN infusion Inject 26.775 mcg/min into the vein continuous.  100 mL  6  . potassium chloride SA  (K-DUR,KLOR-CON) 20 MEQ tablet Take 1 tablet (20 mEq total) by mouth 2 (two) times daily. TAKE EXTRA TAB WHEN YOU TAKE METOLAZONE  60 tablet  3  . simvastatin (ZOCOR) 40 MG tablet Take 40 mg by mouth every evening.      Marland Kitchen spironolactone (ALDACTONE) 25 MG tablet Take 25 mg by mouth daily.      . Tadalafil, PAH, 20 MG TABS Take 2 tablets (40 mg total) by mouth daily.  60 tablet  6  . torsemide (DEMADEX) 20 MG tablet Take by mouth daily. Take 40 mg in the am and 20 mg in the pm      . traMADol (ULTRAM) 50 MG tablet Take 50 mg by mouth every 12 (twelve) hours as needed for moderate pain.      Marland Kitchen warfarin (COUMADIN) 4 MG tablet Take 4 mg by mouth daily.      Marland Kitchen warfarin (COUMADIN) 4 MG tablet Take 1 tablet (4 mg total) by mouth as directed. Take As Directed by Coumadin Clinic  35 tablet  3   No current facility-administered medications for this encounter.    Filed Vitals:   01/15/14 0934  BP: 103/52  Pulse: 79  Resp: 18  Weight: 165 lb (74.844 kg)  SpO2: 97%   PHYSICAL EXAM: General:  Elderly, well appearing. NAD.No resp difficulty; interpreter present HEENT: normal Neck: supple. JVD 8  Carotids 2+ bilaterally; no bruits. No lymphadenopathy or thryomegaly appreciated. Cor: PMI laterally displaced. irreg. No s3 or murmur.  Lungs: clear Abdomen: soft, nontender, mildly distended. No hepatosplenomegaly. No bruits or masses. Good bowel sounds. Extremities: no cyanosis, clubbing, rash. No ankle edema.  RUE PICC site ok Neuro: alert & orientedx3, cranial nerves grossly intact. Moves all 4 extremities w/o difficulty. Affect pleasant.   ASSESSMENT & PLAN:  1. Chronic systolic CHF: NICM, EF 57%  s/p CRT-D ; Translator Present  - Doing well. NYHA II symptom and volume status slightly elevated. Patient has not taken diuretics today. Optivol interrogated: Fluid above threshold, however thoracic impedence trending up. No Afib. Patient activity ~ 2 hrs a day.  - Will continue torsemide 40 mg q am and  20 mg q pm. - Continue milrinone thru PICC. - Not on BB with low output. - Not on ACE-I due to rash. - No Spiro with CKD. - Reinforced the need and importance of daily weights, a low sodium diet, and fluid restriction (less than 2 L a day). Instructed to call the HF clinic if weight increases more than 3 lbs overnight or 5  lbs in a week.  2. Paroxysmal atrial fibrillation/flutter: Recently cardioverted back into NSR 12/27/13. He is maintaining NSR on amiodraone. Will continue amio 200 mg BID. Continue coumadin. 3. Pulmonary arterial hypertension: Noted on RHC 02/21/13.  Continue Adcirca 40 mg daily and milrinone.  4. CKD stage III: BMET yesterday creatinine 2.45. BUN stable. Baseline Creatinine 2.2-2.6  F/U 6 weeks Rande Brunt  NP-C   01/15/2014

## 2014-01-15 ENCOUNTER — Ambulatory Visit (HOSPITAL_COMMUNITY)
Admission: RE | Admit: 2014-01-15 | Discharge: 2014-01-15 | Disposition: A | Discharge status: Home / Self Care | Payer: Medicare Other | Source: Ambulatory Visit | Attending: Cardiology | Admitting: Cardiology

## 2014-01-15 ENCOUNTER — Encounter (HOSPITAL_COMMUNITY): Payer: Self-pay

## 2014-01-15 VITALS — BP 103/52 | HR 79 | Resp 18 | Wt 165.0 lb

## 2014-01-15 DIAGNOSIS — I5022 Chronic systolic (congestive) heart failure: Secondary | ICD-10-CM | POA: Insufficient documentation

## 2014-01-15 DIAGNOSIS — I452 Bifascicular block: Secondary | ICD-10-CM | POA: Insufficient documentation

## 2014-01-15 DIAGNOSIS — Z7901 Long term (current) use of anticoagulants: Secondary | ICD-10-CM | POA: Insufficient documentation

## 2014-01-15 DIAGNOSIS — I252 Old myocardial infarction: Secondary | ICD-10-CM | POA: Diagnosis not present

## 2014-01-15 DIAGNOSIS — I2789 Other specified pulmonary heart diseases: Secondary | ICD-10-CM | POA: Insufficient documentation

## 2014-01-15 DIAGNOSIS — I129 Hypertensive chronic kidney disease with stage 1 through stage 4 chronic kidney disease, or unspecified chronic kidney disease: Secondary | ICD-10-CM | POA: Insufficient documentation

## 2014-01-15 DIAGNOSIS — E7800 Pure hypercholesterolemia, unspecified: Secondary | ICD-10-CM | POA: Diagnosis not present

## 2014-01-15 DIAGNOSIS — N183 Chronic kidney disease, stage 3 unspecified: Secondary | ICD-10-CM

## 2014-01-15 DIAGNOSIS — I2721 Secondary pulmonary arterial hypertension: Secondary | ICD-10-CM

## 2014-01-15 DIAGNOSIS — I509 Heart failure, unspecified: Secondary | ICD-10-CM | POA: Insufficient documentation

## 2014-01-15 DIAGNOSIS — I480 Paroxysmal atrial fibrillation: Secondary | ICD-10-CM

## 2014-01-15 DIAGNOSIS — I4891 Unspecified atrial fibrillation: Secondary | ICD-10-CM | POA: Insufficient documentation

## 2014-01-15 DIAGNOSIS — I4892 Unspecified atrial flutter: Secondary | ICD-10-CM | POA: Diagnosis not present

## 2014-01-15 DIAGNOSIS — Z8673 Personal history of transient ischemic attack (TIA), and cerebral infarction without residual deficits: Secondary | ICD-10-CM | POA: Diagnosis not present

## 2014-01-15 NOTE — Patient Instructions (Signed)
Hacer las siguientes cosas todos los dias... 1) Mismo peso en la manana antes del desayuno, al [employer]. 2) Tomar sus medicamentos segun lo prescripto. 3) Comer alimentos bajos en sal.  Limitar la ingesta de sodio a 2.000mg  por dia. 4) Mantenerse tan activo como sea posible todos los [place]. 5) Limitar todos los fluidos para el dia a menos de 2 litros.   Follow up 6 weeks

## 2014-01-19 ENCOUNTER — Other Ambulatory Visit (HOSPITAL_COMMUNITY): Payer: Self-pay | Admitting: Internal Medicine

## 2014-01-20 ENCOUNTER — Encounter: Payer: Self-pay | Admitting: Internal Medicine

## 2014-01-21 ENCOUNTER — Ambulatory Visit (INDEPENDENT_AMBULATORY_CARE_PROVIDER_SITE_OTHER): Payer: Medicare Other | Admitting: Cardiovascular Disease

## 2014-01-21 LAB — POCT INR: INR: 4.2

## 2014-01-22 ENCOUNTER — Telehealth (HOSPITAL_COMMUNITY): Payer: Self-pay | Admitting: Adult Health

## 2014-01-22 NOTE — Telephone Encounter (Signed)
Spoke [facility]  Regarding elevated creatinine.  Creatinine up from 1.8>2.5  Instructed to hold potassium and torsemide for 2 days.   Cordella Nyquist HHRN will contact regarding above.   Kaisei Gilbo NP-C  5:12 PM

## 2014-01-28 ENCOUNTER — Ambulatory Visit (INDEPENDENT_AMBULATORY_CARE_PROVIDER_SITE_OTHER): Payer: Medicare Other | Admitting: Pharmacist Clinician (PhC)/ Clinical Pharmacy Specialist

## 2014-01-28 ENCOUNTER — Telehealth (HOSPITAL_COMMUNITY): Payer: Self-pay | Admitting: *Deleted

## 2014-01-28 LAB — POCT INR: INR: 1.4

## 2014-01-28 MED ORDER — TORSEMIDE 20 MG PO TABS: 40.0000 mg | ORAL_TABLET | Freq: Every day | ORAL | Status: DC

## 2014-01-28 NOTE — Telephone Encounter (Signed)
Received call from [facility] [place] with [facility] she states that she is seeing pt today and drew his labs, she verified medications he is only taking Torsemide 40 mg daily and Potassium 40 meq in AM and 20 meq in PM, she states wt is stable and his abd girth is smaller, ankles are very skinny, per chart pt should be taking Torsemide 40 mg in AM and 20 mg in PM, advised since pt was doing well on 40 mg daily to continue that dose, med list adjusted in system

## 2014-01-29 ENCOUNTER — Telehealth (HOSPITAL_COMMUNITY): Payer: Self-pay | Admitting: Anesthesiology

## 2014-01-29 MED ORDER — TORSEMIDE 20 MG PO TABS: 20.0000 mg | ORAL_TABLET | Freq: Two times a day (BID) | ORAL | Status: DC

## 2014-01-29 NOTE — Telephone Encounter (Signed)
Called patient's home health nurse to review patient's lab results with her.  Nurse clarified that patient is already taking 20mg  torsemide twice daily.  Per Tyler Christensen, hold medication x 2 days then resume back to 20mg  twice daily.  Nurse verbalizes understanding and will contact patient with interpreting services to inform.

## 2014-01-29 NOTE — Telephone Encounter (Addendum)
Reviewed patient's labs.   K+ 3.6, creatinine 2.53 (1.81), BUN 48 (34), magnesium 2.1   Will have patient decrease torsemide to 20 mg BID.  Junie Bame B 4:39 PM

## 2014-02-04 ENCOUNTER — Ambulatory Visit (INDEPENDENT_AMBULATORY_CARE_PROVIDER_SITE_OTHER): Payer: Medicare Other | Admitting: Cardiology

## 2014-02-04 LAB — POCT INR: INR: 1.4

## 2014-02-06 ENCOUNTER — Other Ambulatory Visit (HOSPITAL_COMMUNITY): Payer: Self-pay | Admitting: Anesthesiology

## 2014-02-11 ENCOUNTER — Ambulatory Visit (INDEPENDENT_AMBULATORY_CARE_PROVIDER_SITE_OTHER): Payer: Medicare Other | Admitting: Cardiovascular Disease

## 2014-02-11 ENCOUNTER — Telehealth (HOSPITAL_COMMUNITY): Payer: Self-pay | Admitting: Vascular Surgery

## 2014-02-11 ENCOUNTER — Other Ambulatory Visit (HOSPITAL_COMMUNITY): Payer: Self-pay | Admitting: Anesthesiology

## 2014-02-11 ENCOUNTER — Other Ambulatory Visit (HOSPITAL_COMMUNITY): Payer: Self-pay | Admitting: Internal Medicine

## 2014-02-11 DIAGNOSIS — I4891 Unspecified atrial fibrillation: Secondary | ICD-10-CM

## 2014-02-11 LAB — POCT INR: INR: 2

## 2014-02-11 NOTE — Telephone Encounter (Signed)
Cant get blood from Picc line .Marland Kitchen Nurse just wanted to see if he needs to come in for blood draw Please advise.Marland KitchenMarland Kitchen

## 2014-02-11 NOTE — Telephone Encounter (Signed)
Spoke w/Tyler Christensen she states she is unable to get blood out of PICC, she did get labs via venipuncture, will sch pt for cath flo in short stay and call her back

## 2014-02-12 ENCOUNTER — Encounter: Payer: Self-pay | Admitting: Internal Medicine

## 2014-02-12 ENCOUNTER — Other Ambulatory Visit (HOSPITAL_COMMUNITY): Payer: Self-pay | Admitting: Internal Medicine

## 2014-02-13 ENCOUNTER — Other Ambulatory Visit (HOSPITAL_COMMUNITY): Payer: Self-pay | Admitting: *Deleted

## 2014-02-13 NOTE — Telephone Encounter (Signed)
Pt sch for TPA 8/28 at 12 in short stay, labs show bun 49 cr 2.43, per Junie Bame, NP make sure pt is not taking metolazone on a regular basis prn only, have spoken with Aimee she is aware and let pt know

## 2014-02-14 ENCOUNTER — Encounter: Payer: Self-pay | Admitting: Internal Medicine

## 2014-02-14 ENCOUNTER — Other Ambulatory Visit (HOSPITAL_COMMUNITY): Payer: Self-pay | Admitting: Cardiology

## 2014-02-14 ENCOUNTER — Ambulatory Visit (HOSPITAL_COMMUNITY)
Admission: RE | Admit: 2014-02-14 | Discharge: 2014-02-14 | Disposition: A | Discharge status: Home / Self Care | Payer: Medicare Other | Source: Ambulatory Visit | Attending: Internal Medicine | Admitting: Internal Medicine

## 2014-02-14 VITALS — BP 104/50 | HR 80 | Temp 97.9°F | Resp 20 | Ht 68.0 in | Wt 164.0 lb

## 2014-02-14 DIAGNOSIS — E785 Hyperlipidemia, unspecified: Secondary | ICD-10-CM

## 2014-02-14 DIAGNOSIS — I509 Heart failure, unspecified: Secondary | ICD-10-CM | POA: Insufficient documentation

## 2014-02-14 DIAGNOSIS — I5023 Acute on chronic systolic (congestive) heart failure: Secondary | ICD-10-CM | POA: Diagnosis present

## 2014-02-14 MED ORDER — SIMVASTATIN 40 MG PO TABS: 40.0000 mg | ORAL_TABLET | Freq: Every evening | ORAL | Status: DC

## 2014-02-14 MED ORDER — HEPARIN SOD (PORK) LOCK FLUSH 100 UNIT/ML IV SOLN
250.0000 [IU] | INTRAVENOUS | Status: AC | PRN
  Administered 2014-02-14: 250 [IU]

## 2014-02-14 MED ORDER — ALTEPLASE 100 MG IV SOLR
2.0000 mg | Freq: Once | INTRAVENOUS | Status: AC
  Administered 2014-02-14: 2 mg
  Filled 2014-02-14: qty 2

## 2014-02-14 MED ORDER — HEPARIN SOD (PORK) LOCK FLUSH 100 UNIT/ML IV SOLN
INTRAVENOUS | Status: AC
  Administered 2014-02-14: 250 [IU]
  Filled 2014-02-14: qty 5

## 2014-02-14 NOTE — Progress Notes (Signed)
tPA removed from PICC line x 1 port.  GBR from port, flushed with 10cc NS and capped with Heparin 250 units a new cap was placed on port.  Marianna Payment

## 2014-02-14 NOTE — Progress Notes (Signed)
Attempted to flush PICC line with 10ccNS, and 250 units of heparin.  Was able to flush PICC line without difficulty, but was not able to get blood return.  Will proceed with TPA to dwell.  Waiting for IV team to infuse.  Will continue to monitor patient.

## 2014-02-15 ENCOUNTER — Other Ambulatory Visit (HOSPITAL_COMMUNITY): Payer: Self-pay | Admitting: Internal Medicine

## 2014-02-17 ENCOUNTER — Encounter: Payer: Self-pay | Admitting: Internal Medicine

## 2014-02-18 ENCOUNTER — Ambulatory Visit (INDEPENDENT_AMBULATORY_CARE_PROVIDER_SITE_OTHER): Payer: Medicare Other | Admitting: Cardiology

## 2014-02-18 LAB — POCT INR: INR: 3.8

## 2014-02-21 ENCOUNTER — Encounter: Payer: Self-pay | Admitting: Internal Medicine

## 2014-02-25 ENCOUNTER — Ambulatory Visit (INDEPENDENT_AMBULATORY_CARE_PROVIDER_SITE_OTHER): Payer: Medicare Other | Admitting: Interventional Cardiology

## 2014-02-25 LAB — POCT INR: INR: 6

## 2014-02-26 ENCOUNTER — Encounter (HOSPITAL_COMMUNITY): Payer: Self-pay

## 2014-02-26 ENCOUNTER — Ambulatory Visit (HOSPITAL_BASED_OUTPATIENT_CLINIC_OR_DEPARTMENT_OTHER)
Admission: RE | Admit: 2014-02-26 | Discharge: 2014-02-26 | Disposition: A | Discharge status: Home / Self Care | Payer: Medicare Other | Source: Ambulatory Visit | Attending: Cardiology | Admitting: Cardiology

## 2014-02-26 ENCOUNTER — Telehealth (HOSPITAL_COMMUNITY): Payer: Self-pay | Admitting: Surgery

## 2014-02-26 VITALS — BP 88/48 | HR 93 | Wt 164.8 lb

## 2014-02-26 DIAGNOSIS — I509 Heart failure, unspecified: Secondary | ICD-10-CM | POA: Insufficient documentation

## 2014-02-26 DIAGNOSIS — I5022 Chronic systolic (congestive) heart failure: Secondary | ICD-10-CM | POA: Insufficient documentation

## 2014-02-26 DIAGNOSIS — Z7901 Long term (current) use of anticoagulants: Secondary | ICD-10-CM

## 2014-02-26 DIAGNOSIS — N185 Chronic kidney disease, stage 5: Secondary | ICD-10-CM | POA: Diagnosis present

## 2014-02-26 DIAGNOSIS — I5023 Acute on chronic systolic (congestive) heart failure: Secondary | ICD-10-CM | POA: Diagnosis present

## 2014-02-26 DIAGNOSIS — Z8673 Personal history of transient ischemic attack (TIA), and cerebral infarction without residual deficits: Secondary | ICD-10-CM

## 2014-02-26 DIAGNOSIS — I959 Hypotension, unspecified: Secondary | ICD-10-CM | POA: Diagnosis present

## 2014-02-26 DIAGNOSIS — N183 Chronic kidney disease, stage 3 unspecified: Secondary | ICD-10-CM | POA: Insufficient documentation

## 2014-02-26 DIAGNOSIS — N170 Acute kidney failure with tubular necrosis: Secondary | ICD-10-CM | POA: Diagnosis not present

## 2014-02-26 DIAGNOSIS — I4891 Unspecified atrial fibrillation: Secondary | ICD-10-CM | POA: Insufficient documentation

## 2014-02-26 DIAGNOSIS — I428 Other cardiomyopathies: Secondary | ICD-10-CM | POA: Diagnosis present

## 2014-02-26 DIAGNOSIS — I129 Hypertensive chronic kidney disease with stage 1 through stage 4 chronic kidney disease, or unspecified chronic kidney disease: Secondary | ICD-10-CM

## 2014-02-26 DIAGNOSIS — I252 Old myocardial infarction: Secondary | ICD-10-CM

## 2014-02-26 DIAGNOSIS — Z95810 Presence of automatic (implantable) cardiac defibrillator: Secondary | ICD-10-CM

## 2014-02-26 DIAGNOSIS — R0602 Shortness of breath: Secondary | ICD-10-CM | POA: Diagnosis not present

## 2014-02-26 DIAGNOSIS — Z79899 Other long term (current) drug therapy: Secondary | ICD-10-CM

## 2014-02-26 DIAGNOSIS — I2789 Other specified pulmonary heart diseases: Secondary | ICD-10-CM | POA: Diagnosis present

## 2014-02-26 DIAGNOSIS — I4892 Unspecified atrial flutter: Secondary | ICD-10-CM

## 2014-02-26 DIAGNOSIS — I2721 Secondary pulmonary arterial hypertension: Secondary | ICD-10-CM

## 2014-02-26 DIAGNOSIS — K5900 Constipation, unspecified: Secondary | ICD-10-CM | POA: Diagnosis not present

## 2014-02-26 DIAGNOSIS — I120 Hypertensive chronic kidney disease with stage 5 chronic kidney disease or end stage renal disease: Secondary | ICD-10-CM | POA: Diagnosis present

## 2014-02-26 DIAGNOSIS — E7800 Pure hypercholesterolemia, unspecified: Secondary | ICD-10-CM | POA: Diagnosis present

## 2014-02-26 DIAGNOSIS — C480 Malignant neoplasm of retroperitoneum: Secondary | ICD-10-CM | POA: Diagnosis present

## 2014-02-26 DIAGNOSIS — I480 Paroxysmal atrial fibrillation: Secondary | ICD-10-CM

## 2014-02-26 DIAGNOSIS — I5189 Other ill-defined heart diseases: Secondary | ICD-10-CM | POA: Diagnosis present

## 2014-02-26 NOTE — Telephone Encounter (Signed)
Called to let [name] Hauula know about med changes per Junie Bame NP.  Told her to tell patient via interpreter to hold Torsemide x 2 days and cut Losartan back to 12.5 mg daily.  She acknowledges understanding and will let patient know through the interpreter.

## 2014-02-26 NOTE — Patient Instructions (Signed)
Hacindolo bien  Llame a cualquier problema.  Seguimiento en 6 semanas.

## 2014-02-26 NOTE — Progress Notes (Signed)
Patient ID: Tyler Christensen, male   DOB: October 07, 1932, 80 y.o.   MRN: [id number]  [employer]  HPI: [name] is a 80 y.o. [name] (non-english speaking) w/ PMHx significant for chronic systolic CHF (EF 60%), NICM, LBBB, PAF (on coumadin), HTN, HLD, CVA, Pulmonary Hypertension and h/o seizure d/o (2/2 CVA 05/2012).   Admitted to [facility] 06/2012 with acute decompensated heart failure EF 20%. As noted below, also diagnosed with severe pulmonary hypertension (PAPs ~ 90) from initial RHC and placed on Milrinone and Revatio (eventually transitioned to tadalafil 40) with marked clinical response and decrease in PA pressures. VQ low probability for pulmonary embolus.  Discharged on Milrinone at 0.375 mcg via PICC. Discharge Weight 146 pounds. Attempted to wean milrinone in 9/14 but failed due to worsening HF and PAH. Milrinone restarted.  Admitted to the hospital 5/21-11/22/13. Found to have staph aureus bacteremia treated with IV abx. TEE negative for endocarditis. Also found to retroperitoneal mass and biopsy revealed benign lesion. He had evidence of low output thought to be due to atrial fibrillation and milrinone increased. Attempted TEE and DC-CV but had LAA clot so deferred cardioversion.  Follow up for Heart Failure: Doing well. Difficulty even with translator to know how often taking metolazone, but I believe he is taking PRN and is using it about 1-2 times a week. Denies SOB, PND, CP or orthopnea. No heart palpitations. Weight at home 160 lbs. Walking 5-10 minutes everyday with no issues. Able to go up hills with minimal SOB. Following a low salt diet and drinking less than 2L a day.    NO ACE-I 2/2 rash   08/14/12 S/P successful DC-CV of AF 10/26/12 S/P BiV-ICD implant 01/07/13 ECHO EF 20%, restrictive diastolic function, mildly decreased RV size and systolic fxn, mild to moderate MR, PA systolic pressure 59 mmHg  02/21/13 ECHO EF 20% PA pressure mean 59 11/12/13: ECHO EF 20-25%, trivial AI,     RHC 02/21/13 (off  milrinone) RA = 9 with v-waves to 20  RV = 72/8/20  PA = 69/27 (44)  PCW = 18-20  Fick cardiac output/index = 2.7/1.5  PVR = 9.6 woods  FA sat = 96%  PA sat = 48%, 52%  Labs      (06/25/13) K 4.2 Creatinine 1.58 Magnesium 2.0     (07/16/13) K 4.2, Cr 1.67    (2/15) creatinine 1.97 => 1.94, K 4.6, HCT 39.4    (4/15) K 4.5, creatinine 1.56, pro-BNP 6912    (4/15) K 3.9, creatinine 2.39, mag 2.1     (09/2013) K 3.7, creatinine 2.03     11/26/13 K 4.6 cr 2.8 hgb 13.2     12/11/13 K 3.4 Creatinine 2.14  Magnesium 2.1     12/19/13: K+ 3.4, creatinine 2.42, BUN 57     01/14/14: K 3.8, creatinine 2.45, BUN 45, mag 2.1    SH: Lives with his wife here in [place].   ROS: All systems negative except as listed in HPI, PMH and Problem List.  Past Medical History  Diagnosis Date  . HTN (hypertension)   . High cholesterol   . CHF (congestive heart failure)   . BBBB (bilateral bundle branch block)   . LBBB (left bundle branch block)   . Weakness   . Tired   . Seizures 06/11/2012    new onset/notes (06/11/2012)  . SOB (shortness of breath)     "sometimes when I lay down;; related to not taking my RX" (06/11/2012)  . Myocardial infarction  06/10/2012  . Atrial fibrillation   . Stroke   . Atrial thrombus     left  . Pulmonary HTN     Current Outpatient Prescriptions  Medication Sig Dispense Refill  . ADCIRCA 20 MG TABS TAKE 2 TABLETS BY MOUTH EVERY DAY  60 tablet  0  . amiodarone (PACERONE) 200 MG tablet Take 1 tablet (200 mg total) by mouth 2 (two) times daily.  60 tablet  6  . Choline Fenofibrate 45 MG capsule Take 45 mg by mouth daily.      Marland Kitchen loratadine (CLARITIN) 10 MG tablet Take 10 mg by mouth daily.      Marland Kitchen losartan (COZAAR) 25 MG tablet take 1 tablet daily.      . magnesium oxide (MAG-OX) 400 MG tablet Take 400 mg by mouth daily.      . metolazone (ZAROXOLYN) 2.5 MG tablet TAKE 1 TABLET BY MOUTH AS DIRECTED  10 tablet  3  . milrinone (PRIMACOR) 20 MG/100ML SOLN infusion Inject  26.775 mcg/min into the vein continuous.  100 mL  6  . potassium chloride SA (K-DUR,KLOR-CON) 20 MEQ tablet Take 1 tablet (20 mEq total) by mouth 2 (two) times daily. TAKE EXTRA TAB WHEN YOU TAKE METOLAZONE  60 tablet  3  . simvastatin (ZOCOR) 40 MG tablet Take 1 tablet (40 mg total) by mouth every evening.  30 tablet  6  . spironolactone (ALDACTONE) 25 MG tablet Take 25 mg by mouth daily.      Marland Kitchen torsemide (DEMADEX) 20 MG tablet Take 40 mg by mouth daily. Takes 2 tablets in evening      . traMADol (ULTRAM) 50 MG tablet Take 50 mg by mouth every 12 (twelve) hours as needed for moderate pain.      Marland Kitchen warfarin (COUMADIN) 4 MG tablet Take 4 mg by mouth daily.      Marland Kitchen warfarin (COUMADIN) 4 MG tablet Take 1 tablet (4 mg total) by mouth as directed. Take As Directed by [facility]  35 tablet  3   No current facility-administered medications for this encounter.    Filed Vitals:   02/26/14 1014  BP: 88/48  Pulse: 93  Weight: 164 lb 12.8 oz (74.753 kg)  SpO2: 93%   PHYSICAL EXAM: General:  Elderly, well appearing. NAD.No resp difficulty; interpreter present HEENT: normal Neck: supple. JVD 8  Carotids 2+ bilaterally; no bruits. No lymphadenopathy or thryomegaly appreciated. Cor: PMI laterally displaced. irreg. No s3 or murmur.  Lungs: clear Abdomen: soft, nontender, mildly distended. No hepatosplenomegaly. No bruits or masses. Good bowel sounds. Extremities: no cyanosis, clubbing, rash. No ankle edema.  RUE PICC site ok Neuro: alert & orientedx3, cranial nerves grossly intact. Moves all 4 extremities w/o difficulty. Affect pleasant.   ASSESSMENT & PLAN:  1. Chronic systolic CHF: NICM, EF 21%  s/p CRT-D ; Translator Present  - Doing well. NYHA II symptoms and volume status stable.  - Will continue torsemide 40 mg daily with metolazone 2.5 mg PRN.  - Not on BB with history of low output and milrinone.  - He is not on ACE-I due to rash. He is taking losartan 25 mg daily, however in the past  was only taking 12.5 mg daily. If creatinine continues to trend up may need to cut back to 12.5 mg daily. Continue Spiro 25 mg daily.  - Continue milrinone 0.375 mcg through PICC.  mportance of daily weights, a low sodium diet, and fluid restriction (less than 2 L a day). Instructed to  call the HF clinic if weight increases more than 3 lbs overnight or 5 lbs in a week.  2. Paroxysmal atrial fibrillation/flutter: Cardioverted back to NSR 12/27/13. Appears to be in NSR on amiodraone. Will continue amio 200 mg BID. Continue coumadin. 3. Pulmonary arterial hypertension: Noted on RHC 02/21/13.  Continue Adcirca 40 mg daily and milrinone.  4. CKD stage III: Baseline Creatinine 2.2-2.6. Continue weekly labs with [facility].   F/U 6 weeks Rande Brunt  NP-C   02/26/2014

## 2014-03-01 ENCOUNTER — Encounter (HOSPITAL_COMMUNITY): Payer: Self-pay | Admitting: Emergency Medicine

## 2014-03-01 ENCOUNTER — Emergency Department (HOSPITAL_COMMUNITY): Payer: Medicare Other

## 2014-03-01 ENCOUNTER — Inpatient Hospital Stay (HOSPITAL_COMMUNITY)
Admission: EM | Admit: 2014-03-01 | Discharge: 2014-03-07 | DRG: 682 | Disposition: A | Discharge status: Home Health | Payer: Medicare Other | Attending: Cardiovascular Disease | Admitting: Cardiovascular Disease

## 2014-03-01 DIAGNOSIS — N184 Chronic kidney disease, stage 4 (severe): Secondary | ICD-10-CM

## 2014-03-01 DIAGNOSIS — R531 Weakness: Secondary | ICD-10-CM

## 2014-03-01 DIAGNOSIS — I509 Heart failure, unspecified: Secondary | ICD-10-CM | POA: Diagnosis present

## 2014-03-01 DIAGNOSIS — K5900 Constipation, unspecified: Secondary | ICD-10-CM | POA: Diagnosis not present

## 2014-03-01 DIAGNOSIS — R1900 Intra-abdominal and pelvic swelling, mass and lump, unspecified site: Secondary | ICD-10-CM

## 2014-03-01 DIAGNOSIS — Z515 Encounter for palliative care: Secondary | ICD-10-CM

## 2014-03-01 DIAGNOSIS — Z95810 Presence of automatic (implantable) cardiac defibrillator: Secondary | ICD-10-CM | POA: Diagnosis not present

## 2014-03-01 DIAGNOSIS — Z8673 Personal history of transient ischemic attack (TIA), and cerebral infarction without residual deficits: Secondary | ICD-10-CM | POA: Diagnosis not present

## 2014-03-01 DIAGNOSIS — I2789 Other specified pulmonary heart diseases: Secondary | ICD-10-CM | POA: Diagnosis present

## 2014-03-01 DIAGNOSIS — N185 Chronic kidney disease, stage 5: Secondary | ICD-10-CM | POA: Diagnosis present

## 2014-03-01 DIAGNOSIS — I120 Hypertensive chronic kidney disease with stage 5 chronic kidney disease or end stage renal disease: Secondary | ICD-10-CM | POA: Diagnosis present

## 2014-03-01 DIAGNOSIS — N170 Acute kidney failure with tubular necrosis: Secondary | ICD-10-CM | POA: Diagnosis present

## 2014-03-01 DIAGNOSIS — R0602 Shortness of breath: Secondary | ICD-10-CM | POA: Diagnosis present

## 2014-03-01 DIAGNOSIS — I5023 Acute on chronic systolic (congestive) heart failure: Secondary | ICD-10-CM

## 2014-03-01 DIAGNOSIS — I480 Paroxysmal atrial fibrillation: Secondary | ICD-10-CM

## 2014-03-01 DIAGNOSIS — I2721 Secondary pulmonary arterial hypertension: Secondary | ICD-10-CM

## 2014-03-01 DIAGNOSIS — I4891 Unspecified atrial fibrillation: Secondary | ICD-10-CM

## 2014-03-01 DIAGNOSIS — Z7189 Other specified counseling: Secondary | ICD-10-CM

## 2014-03-01 DIAGNOSIS — C480 Malignant neoplasm of retroperitoneum: Secondary | ICD-10-CM | POA: Diagnosis present

## 2014-03-01 DIAGNOSIS — I252 Old myocardial infarction: Secondary | ICD-10-CM | POA: Diagnosis not present

## 2014-03-01 DIAGNOSIS — I5021 Acute systolic (congestive) heart failure: Secondary | ICD-10-CM | POA: Diagnosis present

## 2014-03-01 DIAGNOSIS — E7800 Pure hypercholesterolemia, unspecified: Secondary | ICD-10-CM | POA: Diagnosis present

## 2014-03-01 DIAGNOSIS — I5189 Other ill-defined heart diseases: Secondary | ICD-10-CM | POA: Diagnosis present

## 2014-03-01 DIAGNOSIS — I5022 Chronic systolic (congestive) heart failure: Secondary | ICD-10-CM

## 2014-03-01 DIAGNOSIS — N179 Acute kidney failure, unspecified: Secondary | ICD-10-CM

## 2014-03-01 DIAGNOSIS — Z79899 Other long term (current) drug therapy: Secondary | ICD-10-CM | POA: Diagnosis not present

## 2014-03-01 DIAGNOSIS — Z7901 Long term (current) use of anticoagulants: Secondary | ICD-10-CM | POA: Diagnosis not present

## 2014-03-01 DIAGNOSIS — I959 Hypotension, unspecified: Secondary | ICD-10-CM | POA: Diagnosis present

## 2014-03-01 DIAGNOSIS — I428 Other cardiomyopathies: Secondary | ICD-10-CM | POA: Diagnosis present

## 2014-03-01 LAB — COMPREHENSIVE METABOLIC PANEL
ALK PHOS: 152 U/L — AB (ref 39–117)
ALT: 50 U/L (ref 0–53)
ANION GAP: 21 — AB (ref 5–15)
AST: 43 U/L — ABNORMAL HIGH (ref 0–37)
Albumin: 3.6 g/dL (ref 3.5–5.2)
BUN: 108 mg/dL — AB (ref 6–23)
CALCIUM: 8.8 mg/dL (ref 8.4–10.5)
CO2: 21 mEq/L (ref 19–32)
Chloride: 93 mEq/L — ABNORMAL LOW (ref 96–112)
Creatinine, Ser: 6.58 mg/dL — ABNORMAL HIGH (ref 0.50–1.35)
GFR calc non Af Amer: 7 mL/min — ABNORMAL LOW (ref >90)
GFR, EST AFRICAN AMERICAN: 8 mL/min — AB (ref >90)
GLUCOSE: 128 mg/dL — AB (ref 70–99)
POTASSIUM: 3.9 meq/L (ref 3.7–5.3)
Sodium: 135 mEq/L — ABNORMAL LOW (ref 137–147)
Total Bilirubin: 0.4 mg/dL (ref 0.3–1.2)
Total Protein: 7.2 g/dL (ref 6.0–8.3)

## 2014-03-01 LAB — CBC
HEMATOCRIT: 36.8 % — AB (ref 39.0–52.0)
HEMOGLOBIN: 12.8 g/dL — AB (ref 13.0–17.0)
MCH: 31.5 pg (ref 26.0–34.0)
MCHC: 34.8 g/dL (ref 30.0–36.0)
MCV: 90.6 fL (ref 78.0–100.0)
Platelets: 184 10*3/uL (ref 150–400)
RBC: 4.06 MIL/uL — ABNORMAL LOW (ref 4.22–5.81)
RDW: 15 % (ref 11.5–15.5)
WBC: 11.9 10*3/uL — ABNORMAL HIGH (ref 4.0–10.5)

## 2014-03-01 LAB — PROTIME-INR
INR: 3.62 — ABNORMAL HIGH (ref 0.00–1.49)
Prothrombin Time: 36.1 seconds — ABNORMAL HIGH (ref 11.6–15.2)

## 2014-03-01 LAB — PRO B NATRIURETIC PEPTIDE: Pro B Natriuretic peptide (BNP): 11880 pg/mL — ABNORMAL HIGH (ref 0–450)

## 2014-03-01 LAB — TROPONIN I: TROPONIN I: 0.97 ng/mL — AB (ref <0.30)

## 2014-03-01 MED ORDER — ASPIRIN 81 MG PO CHEW
324.0000 mg | CHEWABLE_TABLET | Freq: Once | ORAL | Status: AC
  Administered 2014-03-01: 324 mg via ORAL
  Filled 2014-03-01: qty 4

## 2014-03-01 NOTE — ED Provider Notes (Signed)
CSN: [id number]     Arrival date & time 03/01/14  1928 History   First MD Initiated Contact with Patient 03/01/14 1943     Chief Complaint  Patient presents with  . Shortness of Breath  . Emesis     (Consider location/radiation/quality/duration/timing/severity/associated sxs/prior Treatment) Patient is a 80 y.o. male presenting with shortness of breath. The history is provided by the patient.  Shortness of Breath Severity:  Moderate Onset quality:  Gradual Duration:  3 days Timing:  Constant Progression:  Worsening Chronicity:  New Context: not animal exposure, not emotional upset and not URI   Relieved by:  Nothing Worsened by:  Nothing tried Associated symptoms: no abdominal pain, no chest pain, no cough, no fever, no rash and no vomiting     Past Medical History  Diagnosis Date  . HTN (hypertension)   . High cholesterol   . CHF (congestive heart failure)   . BBBB (bilateral bundle branch block)   . LBBB (left bundle branch block)   . Weakness   . Tired   . Seizures 06/11/2012    new onset/notes (06/11/2012)  . SOB (shortness of breath)     "sometimes when I lay down;; related to not taking my RX" (06/11/2012)  . Myocardial infarction     06/10/2012  . Atrial fibrillation   . Stroke   . Atrial thrombus     left  . Pulmonary HTN    Past Surgical History  Procedure Laterality Date  . Throat surgery  1942    "and nose" (06/11/2012); ?T&A  . Inguinal hernia repair  ~ 2008    "both sides" (06/11/2012)  . Tee without cardioversion  06/29/2012    Procedure: TRANSESOPHAGEAL ECHOCARDIOGRAM (TEE);  Surgeon: Jolaine Artist, MD;  Location: [facility] ENDOSCOPY;  Service: Cardiovascular;  Laterality: N/A;  will need a spanish interpreter Interpreter will be here at 1330-Hope spoke w/ [place]  . Cardioversion N/A 08/14/2012    Procedure: CARDIOVERSION;  Surgeon: Jolaine Artist, MD;  Location: [facility] ENDOSCOPY;  Service: Cardiovascular;  Laterality: N/A;  . Tee without  cardioversion N/A 11/12/2013    Procedure: TRANSESOPHAGEAL ECHOCARDIOGRAM (TEE);  Surgeon: Candee Furbish, MD;  Location: [facility] ENDOSCOPY;  Service: Cardiovascular;  Laterality: N/A;  . Tee without cardioversion N/A 11/19/2013    Procedure: TRANSESOPHAGEAL ECHOCARDIOGRAM (TEE);  Surgeon: Jolaine Artist, MD;  Location: [facility] ENDOSCOPY;  Service: Cardiovascular;  Laterality: N/A;  . Cardioversion N/A 12/27/2013    Procedure: CARDIOVERSION;  Surgeon: Jolaine Artist, MD;  Location: [facility] ENDOSCOPY;  Service: Cardiovascular;  Laterality: N/A;   History reviewed. No pertinent family history. History  Substance Use Topics  . Smoking status: Never Smoker   . Smokeless tobacco: Never Used  . Alcohol Use: No    Review of Systems  Constitutional: Negative for fever and chills.  Respiratory: Positive for shortness of breath. Negative for cough.   Cardiovascular: Negative for chest pain and leg swelling.  Gastrointestinal: Negative for vomiting and abdominal pain.  Skin: Negative for rash.  All other systems reviewed and are negative.     Allergies  Lisinopril  Home Medications   Prior to Admission medications   Medication Sig Start Date End Date Taking? Authorizing Provider  ADCIRCA 20 MG TABS TAKE 2 TABLETS BY MOUTH EVERY DAY 02/11/14   Rande Brunt, NP  amiodarone (PACERONE) 200 MG tablet Take 1 tablet (200 mg total) by mouth 2 (two) times daily. 11/22/13   [name] D Ninfa Meeker, NP  Choline Fenofibrate 45 MG capsule  Take 45 mg by mouth daily.    Historical Provider, MD  loratadine (CLARITIN) 10 MG tablet Take 10 mg by mouth daily.    Historical Provider, MD  losartan (COZAAR) 25 MG tablet take 1 tablet daily.    Jolaine Artist, MD  magnesium oxide (MAG-OX) 400 MG tablet Take 400 mg by mouth daily.    Historical Provider, MD  metolazone (ZAROXOLYN) 2.5 MG tablet TAKE 1 TABLET BY MOUTH AS DIRECTED 02/11/14   Jolaine Artist, MD  milrinone [facility]) 20 MG/100ML SOLN infusion Inject 26.775 mcg/min  into the vein continuous. 11/22/13   [name] D Clegg, NP  potassium chloride SA (K-DUR,KLOR-CON) 20 MEQ tablet Take 1 tablet (20 mEq total) by mouth 2 (two) times daily. TAKE EXTRA TAB WHEN YOU TAKE METOLAZONE 12/12/13   [name] D Ninfa Meeker, NP  simvastatin (ZOCOR) 40 MG tablet Take 1 tablet (40 mg total) by mouth every evening. 02/14/14   Jolaine Artist, MD  spironolactone (ALDACTONE) 25 MG tablet Take 25 mg by mouth daily. 11/22/13   [name] D Ninfa Meeker, NP  torsemide (DEMADEX) 20 MG tablet Take 40 mg by mouth daily. Takes 2 tablets in evening 01/29/14   Jolaine Artist, MD  traMADol (ULTRAM) 50 MG tablet Take 50 mg by mouth every 12 (twelve) hours as needed for moderate pain.    Historical Provider, MD  warfarin (COUMADIN) 4 MG tablet Take 4 mg by mouth daily.    Historical Provider, MD  warfarin (COUMADIN) 4 MG tablet Take 1 tablet (4 mg total) by mouth as directed. Take As Directed by [facility] Clinic 01/01/14   Shaune Pascal Bensimhon, MD   BP 102/60  Pulse 118  Temp(Src) 98.8 F (37.1 C) (Tympanic)  Resp 25  SpO2 99% Physical Exam  Nursing note and vitals reviewed. Constitutional: He is oriented to person, place, and time. He appears well-developed and well-nourished. No distress.  HENT:  Head: Normocephalic and atraumatic.  Mouth/Throat: Oropharynx is clear and moist. No oropharyngeal exudate.  Eyes: EOM are normal. Pupils are equal, round, and reactive to light.  Neck: Normal range of motion. Neck supple.  Cardiovascular: Normal rate and regular rhythm.  Exam reveals no friction rub.   No murmur heard. Pulmonary/Chest: He is in respiratory distress (moderate - decreased air entry in the upper lobes). He has no wheezes. He has rales (moderate, lower lung fields).  Abdominal: He exhibits no distension. There is no tenderness. There is no rebound.  Musculoskeletal: Normal range of motion. He exhibits no edema.  Neurological: He is alert and oriented to person, place, and time.  Skin: No rash noted. He is not  diaphoretic.    ED Course  Procedures (including critical care time) Labs Review Labs Reviewed  CBC  COMPREHENSIVE METABOLIC PANEL  PROTIME-INR  TROPONIN I  PRO B NATRIURETIC PEPTIDE    Imaging Review Dg Chest Port 1 View  03/01/2014   CLINICAL DATA:  Short of breath.  Weakness.  EXAM: PORTABLE CHEST - 1 VIEW  COMPARISON:  11/13/2013.  FINDINGS: Support apparatus: RIGHT upper extremity PICC is present with the tip in the upper SVC. Three lead LEFT subclavian AICD with coronary sinus lead.  Cardiomediastinal Silhouette: Cardiomegaly. Aortic arch atherosclerosis.  Lungs: Basilar atelectasis and airspace disease. Airspace disease is most compatible with pulmonary edema. The airspace disease is symmetric. No pneumothorax.  Effusions:  Small RIGHT pleural effusion  Other:  Monitoring leads project over the chest.  IMPRESSION: Mild to moderate CHF.   Electronically Signed  By: Dereck Ligas M.D.   On: 03/01/2014 20:23     EKG Interpretation   Date/Time:  Saturday March 01 2014 20:08:13 EDT Ventricular Rate:  113 PR Interval:  426 QRS Duration: 194 QT Interval:  421 QTC Calculation: 577 R Axis:   -139 Text Interpretation:  Sinus tachycardia Prolonged PR interval Left atrial  enlargement Nonspecific intraventricular conduction delay Paced Confirmed  by Mingo Amber  MD, Samarion Ehle ([id number]) on 03/01/2014 8:20:03 PM      MDM   Final diagnoses:  CHF exacerbation  Acute renal failure, unspecified acute renal failure type     80 year old male here with shortness of breath. Present for the past 3 days. Denies any chest pain. Is on IV milrinone drip constantly for heart failure. Followed by Dr. Haroldine Laws. Patient here with decreased air entry in all lobes, mild Rales in lower lobes. Satting well on nasal cannula. Intermittent low blood pressures, could be secondary to milrinone. Concern for CHF exacerbation. Troponin elevated, aspirin given. BNP markedly elevated at 11,000, also has new renal  failure with creatinine of 6.58, baseline is between 1.8 and 2. Electrolytes okay. Admitted by cardiology.    Evelina Bucy, MD 03/02/14 [phone]

## 2014-03-01 NOTE — ED Notes (Addendum)
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 Pt also received 4mg  zofran IVP en route.

## 2014-03-01 NOTE — H&P (Signed)
CARDIOLOGY CONSULT NOTE  Patient ID: Tyler Christensen, MRN: [id number], DOB/AGE: 1933-06-09 80 y.o. Admit date: 03/01/2014 Date of Consult: 03/01/2014  Primary Physician: Harvie Junior, MD Primary Cardiologist: Dr. Haroldine Laws  Chief Complaint: abdominal pain Reason for Consultation: concern for CHF  HPI: 80 y.o. male w/ PMHx significant for NICM with EF of 20% on chronic milrinone, HTN, pulm HTN, chronic renal insufficiency who presented to [facility] on 03/01/2014 with complaints of abdominal pain. Spanish speaking only, used interpreter line though still appears to be some confusion regarding medications.  He was seen in cardiology clinic on 9/9 and reportedly was doing well at that time. He reports since then, has steadily worsened with increased shortness of breath, increased abdominal distension, feeling cold, lightheadedness, palpitations, decreased appetite and decreased urine output. However, his weight is unchanged (158 at home) and he denies LE edema. Denies any fever, chills (though feels cold at times).   Notes indicate that he was to decrease his torsemide (?labs drawn) but he does did not do this. Took metalazone once in the last 4 days.   Initial EKG shows a wide LBBB, likely sinus tach. Followup showed sinus with V paced.   Past Medical History  Diagnosis Date  . HTN (hypertension)   . High cholesterol   . CHF (congestive heart failure)   . BBBB (bilateral bundle branch block)   . LBBB (left bundle branch block)   . Weakness   . Tired   . Seizures 06/11/2012    new onset/notes (06/11/2012)  . SOB (shortness of breath)     "sometimes when I lay down;; related to not taking my RX" (06/11/2012)  . Myocardial infarction     06/10/2012  . Atrial fibrillation   . Stroke   . Atrial thrombus     left  . Pulmonary HTN       Surgical History:  Past Surgical History  Procedure Laterality Date  . Throat surgery  1942    "and nose" (06/11/2012); ?T&A  . Inguinal  hernia repair  ~ 2008    "both sides" (06/11/2012)  . Tee without cardioversion  06/29/2012    Procedure: TRANSESOPHAGEAL ECHOCARDIOGRAM (TEE);  Surgeon: Jolaine Artist, MD;  Location: [facility] ENDOSCOPY;  Service: Cardiovascular;  Laterality: N/A;  will need a spanish interpreter Interpreter will be here at 1330-Hope spoke w/ [place]  . Cardioversion N/A 08/14/2012    Procedure: CARDIOVERSION;  Surgeon: Jolaine Artist, MD;  Location: [facility] ENDOSCOPY;  Service: Cardiovascular;  Laterality: N/A;  . Tee without cardioversion N/A 11/12/2013    Procedure: TRANSESOPHAGEAL ECHOCARDIOGRAM (TEE);  Surgeon: Candee Furbish, MD;  Location: [facility] ENDOSCOPY;  Service: Cardiovascular;  Laterality: N/A;  . Tee without cardioversion N/A 11/19/2013    Procedure: TRANSESOPHAGEAL ECHOCARDIOGRAM (TEE);  Surgeon: Jolaine Artist, MD;  Location: [facility] ENDOSCOPY;  Service: Cardiovascular;  Laterality: N/A;  . Cardioversion N/A 12/27/2013    Procedure: CARDIOVERSION;  Surgeon: Jolaine Artist, MD;  Location: [facility] ENDOSCOPY;  Service: Cardiovascular;  Laterality: N/A;     Home Meds: Prior to Admission medications   Medication Sig Start Date End Date Taking? Authorizing Provider  amiodarone (PACERONE) 200 MG tablet Take 1 tablet (200 mg total) by mouth 2 (two) times daily. 11/22/13  Yes [name] D Clegg, NP  Choline Fenofibrate 45 MG capsule Take 45 mg by mouth daily.   Yes Historical Provider, MD  loratadine (CLARITIN) 10 MG tablet Take 10 mg by mouth daily.   Yes Historical Provider, MD  losartan (COZAAR) 25  MG tablet Take 25 mg by mouth daily.   Yes Historical Provider, MD  magnesium oxide (MAG-OX) 400 MG tablet Take 400 mg by mouth daily.   Yes Historical Provider, MD  metolazone (ZAROXOLYN) 2.5 MG tablet Take 2.5 mg by mouth daily as needed (according to instructions).   Yes Historical Provider, MD  milrinone (PRIMACOR) 20 MG/100ML SOLN infusion Inject 26.775 mcg/min into the vein continuous. 11/22/13  Yes [name] D Clegg, NP  potassium  chloride SA (K-DUR,KLOR-CON) 20 MEQ tablet Take 1 tablet (20 mEq total) by mouth 2 (two) times daily. TAKE EXTRA TAB WHEN YOU TAKE METOLAZONE 12/12/13  Yes [name] D Clegg, NP  simvastatin (ZOCOR) 40 MG tablet Take 1 tablet (40 mg total) by mouth every evening. 02/14/14  Yes Jolaine Artist, MD  spironolactone (ALDACTONE) 25 MG tablet Take 25 mg by mouth daily. 11/22/13  Yes [name] D Clegg, NP  Tadalafil, PAH, (ADCIRCA) 20 MG TABS Take 40 mg by mouth daily.   Yes Historical Provider, MD  torsemide (DEMADEX) 20 MG tablet Take 40 mg by mouth every evening.  01/29/14  Yes Jolaine Artist, MD  traMADol (ULTRAM) 50 MG tablet Take 50 mg by mouth every 12 (twelve) hours as needed for moderate pain.   Yes Historical Provider, MD  warfarin (COUMADIN) 4 MG tablet Take 4 mg by mouth daily.   Yes Historical Provider, MD    Inpatient Medications:    Allergies:  Allergies  Allergen Reactions  . Lisinopril Rash    History   Social History  . Marital Status: Divorced    Spouse Name: N/A    Number of Children: N/A  . Years of Education: N/A   Occupational History  . Not on file.   Social History Main Topics  . Smoking status: Never Smoker   . Smokeless tobacco: Never Used  . Alcohol Use: No  . Drug Use: No  . Sexual Activity: No   Other Topics Concern  . Not on file   Social History Narrative  . No narrative on file     History reviewed. No pertinent family history.   Review of Systems: General: negative for chills, fever, night sweats or weight changes.  Cardiovascular: see HPI Dermatological: negative for rash Respiratory: negative for cough or wheezing Urologic: negative for hematuria Abdominal: negative for nausea, vomiting, diarrhea, bright red blood per rectum, melena, or hematemesis Neurologic: negative for visual changes, syncope,  All other systems reviewed and are otherwise negative except as noted above.  Labs:  Recent Labs  03/01/14 1959  TROPONINI 0.97*   Lab  Results  Component Value Date   WBC 11.9* 03/01/2014   HGB 12.8* 03/01/2014   HCT 36.8* 03/01/2014   MCV 90.6 03/01/2014   PLT 184 03/01/2014    Recent Labs Lab 03/01/14 1959  NA 135*  K 3.9  CL 93*  CO2 21  BUN 108*  CREATININE 6.58*  CALCIUM 8.8  PROT 7.2  BILITOT 0.4  ALKPHOS 152*  ALT 50  AST 43*  GLUCOSE 128*   Lab Results  Component Value Date   CHOL 128 06/12/2012   HDL 27* 06/12/2012   LDLCALC 78 06/12/2012   TRIG 113 06/12/2012   Lab Results  Component Value Date   DDIMER 0.29 04/04/2012    Radiology/Studies:  Dg Chest Port 1 View  03/01/2014   CLINICAL DATA:  Short of breath.  Weakness.  EXAM: PORTABLE CHEST - 1 VIEW  COMPARISON:  11/13/2013.  FINDINGS: Support apparatus: RIGHT upper  extremity PICC is present with the tip in the upper SVC. Three lead LEFT subclavian AICD with coronary sinus lead.  Cardiomediastinal Silhouette: Cardiomegaly. Aortic arch atherosclerosis.  Lungs: Basilar atelectasis and airspace disease. Airspace disease is most compatible with pulmonary edema. The airspace disease is symmetric. No pneumothorax.  Effusions:  Small RIGHT pleural effusion  Other:  Monitoring leads project over the chest.  IMPRESSION: Mild to moderate CHF.   Electronically Signed   By: Dereck Ligas M.D.   On: 03/01/2014 20:23    EKG: sinus with V pacing, similar on telemetry  Physical Exam: Blood pressure 109/40, pulse 104, temperature 98.8 F (37.1 C), temperature source Tympanic, resp. rate 28, SpO2 95.00%. General: , in no acute distress. Head: Normocephalic, atraumatic, sclera non-icteric, no xanthomas, nares are without discharge.  Neck: Supple. Negative for carotid bruits. JVD appears to be at jaw at 30 deg. Lungs: decreased at R base Heart: RRR with S1 S2. No murmurs, rubs, or gallops appreciated. Abdomen: distended and mildly tender Msk:  Strength and tone appear normal for age. Extremities: No clubbing or cyanosis. No edema.  Distal pedal pulses are 2+  and equal bilaterally. Ext warm and perfused Neuro: Alert and oriented X 3. Moves all extremities spontaneously. Psych:  Responds to questions appropriately with a normal affect.   Assessment and Plan:  Problem List 1. Acute on chronic systolic heart failure, Biventricular, fluid status unclear, Stage IV on milrinone 2. Pulmonary HTN 3. Paroxysmal afib, on anticoagulation 4. Acute on chronic renal dysfunction 5. S/p BiVe AICD  80 y.o. male w/ PMHx significant for NICM with EF of 20% on chronic milrinone, HTN, pulm HTN, chronic renal insufficiency who presented to [facility] on 03/01/2014 with complaints of abdominal pain and distension, shortness of breath and lightheadedness. Found to be in acute on chronic renal failure.  His fluid status is unclear: chest xray suggestive of edema, JVP appears up and his abdominal distension is c/w R failure, and has renal dysfunction but his weight is unchanged, no edema and surprisingly his extremities are warm and perfused. However, I am concerned that the problem is primary pump failure. Admittedly, I do not know whether to diurese or give fluids- may need right heart cath to better delineate. Will hold diuretics at this point (as this was CHF clinics thoughts which he did not follow through with) and monitor. Continue tadafil and milrinone.  Hold losartan due to renal failure. Recheck renal doppler to ensure not primary renal dysfunction.  Concern for frequent arrhythmias which may be contributing to his symptoms (afib vs. Slow VT). Need to interrogate his device tomorrow to assess arrhythmia burden. Continue amiodarone.  Hold coumadin as supratherapeutic.  Briefly discussed code status and he reports full code (through interpreter) at this time.   Signed, Elias Else, Mila Homer MD 03/01/2014, 11:56 PM

## 2014-03-01 NOTE — ED Notes (Signed)
Via  Telephone spanish translator - pt c/o progressively worse shortness of breath and n/v described as "stomachache"  - pt currently receiving milrinone via PICC line d/t CHF - pt w/ diffuse insp/exp wheezing and diminished lung sounds. Titrating pt to nasal cannula.

## 2014-03-02 ENCOUNTER — Inpatient Hospital Stay (HOSPITAL_COMMUNITY): Payer: Medicare Other

## 2014-03-02 ENCOUNTER — Encounter (HOSPITAL_COMMUNITY): Payer: Self-pay | Admitting: Radiology

## 2014-03-02 DIAGNOSIS — I4891 Unspecified atrial fibrillation: Secondary | ICD-10-CM

## 2014-03-02 DIAGNOSIS — N179 Acute kidney failure, unspecified: Secondary | ICD-10-CM

## 2014-03-02 DIAGNOSIS — I5022 Chronic systolic (congestive) heart failure: Secondary | ICD-10-CM

## 2014-03-02 DIAGNOSIS — I2789 Other specified pulmonary heart diseases: Secondary | ICD-10-CM

## 2014-03-02 LAB — URINALYSIS, ROUTINE W REFLEX MICROSCOPIC
Bilirubin Urine: NEGATIVE
Glucose, UA: NEGATIVE mg/dL
KETONES UR: NEGATIVE mg/dL
LEUKOCYTES UA: NEGATIVE
NITRITE: NEGATIVE
PROTEIN: NEGATIVE mg/dL
Specific Gravity, Urine: 1.015 (ref 1.005–1.030)
UROBILINOGEN UA: 0.2 mg/dL (ref 0.0–1.0)
pH: 5 (ref 5.0–8.0)

## 2014-03-02 LAB — BASIC METABOLIC PANEL
Anion gap: 18 — ABNORMAL HIGH (ref 5–15)
BUN: 109 mg/dL — ABNORMAL HIGH (ref 6–23)
CO2: 23 mEq/L (ref 19–32)
Calcium: 8.4 mg/dL (ref 8.4–10.5)
Chloride: 92 mEq/L — ABNORMAL LOW (ref 96–112)
Creatinine, Ser: 6.4 mg/dL — ABNORMAL HIGH (ref 0.50–1.35)
GFR calc non Af Amer: 7 mL/min — ABNORMAL LOW (ref >90)
GFR, EST AFRICAN AMERICAN: 9 mL/min — AB (ref >90)
Glucose, Bld: 161 mg/dL — ABNORMAL HIGH (ref 70–99)
POTASSIUM: 4 meq/L (ref 3.7–5.3)
SODIUM: 133 meq/L — AB (ref 137–147)

## 2014-03-02 LAB — CARBOXYHEMOGLOBIN
CARBOXYHEMOGLOBIN: 1.5 % (ref 0.5–1.5)
CARBOXYHEMOGLOBIN: 1.4 % (ref 0.5–1.5)
METHEMOGLOBIN: 0.7 % (ref 0.0–1.5)
Methemoglobin: 1 % (ref 0.0–1.5)
O2 Saturation: 80.9 %
O2 Saturation: 67.3 %
Total hemoglobin: 12.1 g/dL — ABNORMAL LOW (ref 13.5–18.0)
Total hemoglobin: 11.8 g/dL — ABNORMAL LOW (ref 13.5–18.0)

## 2014-03-02 LAB — MRSA PCR SCREENING: MRSA BY PCR: NEGATIVE

## 2014-03-02 LAB — URINE MICROSCOPIC-ADD ON

## 2014-03-02 MED ORDER — TADALAFIL (PAH) 20 MG PO TABS
40.0000 mg | ORAL_TABLET | Freq: Every day | ORAL | Status: DC
Start: 2014-03-02 — End: 2014-03-03
  Administered 2014-03-02: 40 mg via ORAL
  Filled 2014-03-02 (×2): qty 2

## 2014-03-02 MED ORDER — SIMVASTATIN 40 MG PO TABS
40.0000 mg | ORAL_TABLET | Freq: Every evening | ORAL | Status: DC
  Filled 2014-03-02: qty 1

## 2014-03-02 MED ORDER — ACETAMINOPHEN 325 MG PO TABS
650.0000 mg | ORAL_TABLET | ORAL | Status: DC | PRN
  Administered 2014-03-03 – 2014-03-04 (×2): 650 mg via ORAL
  Filled 2014-03-02 (×2): qty 2

## 2014-03-02 MED ORDER — TRAMADOL HCL 50 MG PO TABS
50.0000 mg | ORAL_TABLET | Freq: Two times a day (BID) | ORAL | Status: DC | PRN
  Administered 2014-03-03 – 2014-03-04 (×2): 50 mg via ORAL
  Filled 2014-03-02 (×2): qty 1

## 2014-03-02 MED ORDER — MAGNESIUM OXIDE 400 (241.3 MG) MG PO TABS
400.0000 mg | ORAL_TABLET | Freq: Every day | ORAL | Status: DC
Start: 2014-03-02 — End: 2014-03-07
  Administered 2014-03-02 – 2014-03-07 (×6): 400 mg via ORAL
  Filled 2014-03-02 (×6): qty 1

## 2014-03-02 MED ORDER — AMIODARONE HCL 200 MG PO TABS
200.0000 mg | ORAL_TABLET | Freq: Two times a day (BID) | ORAL | Status: DC
  Administered 2014-03-02 – 2014-03-07 (×12): 200 mg via ORAL
  Filled 2014-03-02 (×14): qty 1

## 2014-03-02 MED ORDER — ONDANSETRON HCL 4 MG/2ML IJ SOLN: 4.0000 mg | Freq: Four times a day (QID) | INTRAMUSCULAR | Status: DC | PRN

## 2014-03-02 MED ORDER — SODIUM CHLORIDE 0.9 % IV SOLN
250.0000 mL | INTRAVENOUS | Status: DC | PRN
  Administered 2014-03-06: 250 mL via INTRAVENOUS

## 2014-03-02 MED ORDER — IOHEXOL 300 MG/ML  SOLN: 25.0000 mL | INTRAMUSCULAR | Status: AC

## 2014-03-02 MED ORDER — ATORVASTATIN CALCIUM 20 MG PO TABS
20.0000 mg | ORAL_TABLET | Freq: Every day | ORAL | Status: DC
  Administered 2014-03-02 – 2014-03-06 (×5): 20 mg via ORAL
  Filled 2014-03-02 (×6): qty 1

## 2014-03-02 MED ORDER — SODIUM CHLORIDE 0.9 % IJ SOLN
3.0000 mL | Freq: Two times a day (BID) | INTRAMUSCULAR | Status: DC
  Administered 2014-03-02 – 2014-03-06 (×10): 3 mL via INTRAVENOUS

## 2014-03-02 MED ORDER — SODIUM CHLORIDE 0.9 % IJ SOLN: 3.0000 mL | INTRAMUSCULAR | Status: DC | PRN

## 2014-03-02 MED ORDER — MILRINONE IN DEXTROSE 20 MG/100ML IV SOLN
0.3750 ug/kg/min | INTRAVENOUS | Status: DC
  Administered 2014-03-02 (×3): 0.375 ug/kg/min via INTRAVENOUS
  Filled 2014-03-02 (×3): qty 100

## 2014-03-02 MED ORDER — CETYLPYRIDINIUM CHLORIDE 0.05 % MT LIQD
7.0000 mL | Freq: Two times a day (BID) | OROMUCOSAL | Status: DC
Start: 2014-03-02 — End: 2014-03-04
  Administered 2014-03-03 (×2): 7 mL via OROMUCOSAL

## 2014-03-02 NOTE — Progress Notes (Signed)
Advanced Heart Failure Rounding Note   Subjective:    80 y.o. male w/ PMHx significant for NICM with EF of 20% on chronic milrinone, HTN, pulm HTN, chronic renal insufficiency  (baseline CR 1.8) admitted 9/12 with ab pain/distension and acute renal failure Cr 6.4  C/o ab pain and bloating. Poor appetite. No dyspnea. Weight is at baseline.   [name] 70-80s overnight. Now 90s.    Objective:   Weight Range:  Vital Signs:   Temp:  [97.5 F (36.4 C)-98.8 F (37.1 C)] 97.8 F (36.6 C) (09/13 0737) Pulse Rate:  [85-118] 85 (09/13 0737) Resp:  [18-35] 23 (09/13 0737) BP: (80-109)/(40-78) 83/42 mmHg (09/13 0737) SpO2:  [92 %-99 %] 97 % (09/13 0737) Weight:  [73.6 kg (162 lb 4.1 oz)] 73.6 kg (162 lb 4.1 oz) (09/13 0149) Last BM Date: 02/28/14  Weight change: Filed Weights   03/02/14 0149  Weight: 73.6 kg (162 lb 4.1 oz)    Intake/Output:   Intake/Output Summary (Last 24 hours) at 03/02/14 0825 Last data filed at 03/02/14 0700  Gross per 24 hour  Intake      0 ml  Output    350 ml  Net   -350 ml     Physical Exam: General:  Elderly man. Lying in bed. No resp difficulty HEENT: normal Neck: supple. JVP 9 . Carotids 2+ bilat; no bruits. No lymphadenopathy or thryomegaly appreciated. Cor: PMI laterally displaced. Distant regular.  Lungs: clear Abdomen: soft, nontender, markedly distended. Good bowel sounds. Extremities: no cyanosis, clubbing, rash, edema Neuro: alert & orientedx3, cranial nerves grossly intact. moves all 4 extremities w/o difficulty. Affect pleasant  Telemetry: ST 100  Labs: Basic Metabolic Panel:  Recent Labs Lab 03/01/14 1959 03/02/14 0727  NA 135* 133*  K 3.9 4.0  CL 93* 92*  CO2 21 23  GLUCOSE 128* 161*  BUN 108* 109*  CREATININE 6.58* 6.40*  CALCIUM 8.8 8.4    Liver Function Tests:  Recent Labs Lab 03/01/14 1959  AST 43*  ALT 50  ALKPHOS 152*  BILITOT 0.4  PROT 7.2  ALBUMIN 3.6   No results found for this basename: LIPASE,  AMYLASE,  in the last 168 hours No results found for this basename: AMMONIA,  in the last 168 hours  CBC:  Recent Labs Lab 03/01/14 1959  WBC 11.9*  HGB 12.8*  HCT 36.8*  MCV 90.6  PLT 184    Cardiac Enzymes:  Recent Labs Lab 03/01/14 1959  TROPONINI 0.97*    BNP: BNP (last 3 results)  Recent Labs  11/07/13 0708 03/01/14 1959  PROBNP 9356.0* 11880.0*     Other results:  EKG:   Imaging: Dg Chest Port 1 View  03/01/2014   CLINICAL DATA:  Short of breath.  Weakness.  EXAM: PORTABLE CHEST - 1 VIEW  COMPARISON:  11/13/2013.  FINDINGS: Support apparatus: RIGHT upper extremity PICC is present with the tip in the upper SVC. Three lead LEFT subclavian AICD with coronary sinus lead.  Cardiomediastinal Silhouette: Cardiomegaly. Aortic arch atherosclerosis.  Lungs: Basilar atelectasis and airspace disease. Airspace disease is most compatible with pulmonary edema. The airspace disease is symmetric. No pneumothorax.  Effusions:  Small RIGHT pleural effusion  Other:  Monitoring leads project over the chest.  IMPRESSION: Mild to moderate CHF.   Electronically Signed   By: Dereck Ligas M.D.   On: 03/01/2014 20:23      Medications:     Scheduled Medications: . amiodarone  200 mg Oral BID  . magnesium  oxide  400 mg Oral Daily  . simvastatin  40 mg Oral QPM  . sodium chloride  3 mL Intravenous Q12H  . Tadalafil (PAH)  40 mg Oral Daily     Infusions: . milrinone 0.375 mcg/kg/min (03/02/14 0306)     PRN Medications:  sodium chloride, acetaminophen, ondansetron (ZOFRAN) IV, sodium chloride, traMADol   Assessment:   1. Acute on chronic renal failure, stage V 2. Advanced chronic systolic HF d/t NICM EF 26% 3. Pulmonary HTN 4. PAF now in NSR on coumadin 5. Abdominal pain and distension  6. Hypotension  Plan/Discussion:     He has marked ab distension with severe renal failure. However overall volume status looks ok and his extremities are warm. Suspect he  likely had episode of hypotension followed by ATN. Will check CVP and co-ox to further evaluate.   Will move to ICU. Renal u/s ordered. Will check UA and noncontrast CT abdomen.   Given severe HF he is not candidate for outpatient HD. So will hold off on contacting Renal for CV[facility]HD this morning. May discuss with them later.   Length of Stay: 1 Glori Bickers MD 03/02/2014, 8:25 AM  Advanced Heart Failure Team Pager [phone] (M-F; [place])  Please contact [facility] for night-coverage after hours (4p -7a ) and weekends on [email or web address]

## 2014-03-02 NOTE — Progress Notes (Signed)
Dr. Haroldine Laws notified of CT results

## 2014-03-03 DIAGNOSIS — R1900 Intra-abdominal and pelvic swelling, mass and lump, unspecified site: Secondary | ICD-10-CM

## 2014-03-03 LAB — CARBOXYHEMOGLOBIN
Carboxyhemoglobin: 1.2 % (ref 0.5–1.5)
METHEMOGLOBIN: 0.9 % (ref 0.0–1.5)
O2 Saturation: 67.7 %
Total hemoglobin: 12.3 g/dL — ABNORMAL LOW (ref 13.5–18.0)

## 2014-03-03 LAB — CBC
HCT: 35 % — ABNORMAL LOW (ref 39.0–52.0)
Hemoglobin: 12.2 g/dL — ABNORMAL LOW (ref 13.0–17.0)
MCH: 31.8 pg (ref 26.0–34.0)
MCHC: 34.9 g/dL (ref 30.0–36.0)
MCV: 91.1 fL (ref 78.0–100.0)
PLATELETS: 166 10*3/uL (ref 150–400)
RBC: 3.84 MIL/uL — ABNORMAL LOW (ref 4.22–5.81)
RDW: 15 % (ref 11.5–15.5)
WBC: 15.5 10*3/uL — AB (ref 4.0–10.5)

## 2014-03-03 LAB — BASIC METABOLIC PANEL
Anion gap: 15 (ref 5–15)
BUN: 97 mg/dL — ABNORMAL HIGH (ref 6–23)
CALCIUM: 8.7 mg/dL (ref 8.4–10.5)
CO2: 26 meq/L (ref 19–32)
CREATININE: 4.16 mg/dL — AB (ref 0.50–1.35)
Chloride: 93 mEq/L — ABNORMAL LOW (ref 96–112)
GFR calc Af Amer: 14 mL/min — ABNORMAL LOW (ref >90)
GFR calc non Af Amer: 12 mL/min — ABNORMAL LOW (ref >90)
Glucose, Bld: 142 mg/dL — ABNORMAL HIGH (ref 70–99)
Potassium: 3.5 mEq/L — ABNORMAL LOW (ref 3.7–5.3)
Sodium: 134 mEq/L — ABNORMAL LOW (ref 137–147)

## 2014-03-03 LAB — PROTIME-INR
INR: 2.69 — AB (ref 0.00–1.49)
Prothrombin Time: 28.6 seconds — ABNORMAL HIGH (ref 11.6–15.2)

## 2014-03-03 MED ORDER — POTASSIUM CHLORIDE CRYS ER 20 MEQ PO TBCR
40.0000 meq | EXTENDED_RELEASE_TABLET | Freq: Once | ORAL | Status: AC
  Administered 2014-03-03: 40 meq via ORAL
  Filled 2014-03-03: qty 2

## 2014-03-03 MED ORDER — WARFARIN - PHARMACIST DOSING INPATIENT
Freq: Every day | Status: DC
  Administered 2014-03-03: 18:00:00

## 2014-03-03 MED ORDER — WARFARIN SODIUM 4 MG PO TABS
4.0000 mg | ORAL_TABLET | Freq: Once | ORAL | Status: AC
  Administered 2014-03-03: 4 mg via ORAL
  Filled 2014-03-03: qty 1

## 2014-03-03 MED ORDER — MILRINONE IN DEXTROSE 20 MG/100ML IV SOLN
0.3750 ug/kg/min | INTRAVENOUS | Status: DC
  Administered 2014-03-03 – 2014-03-07 (×8): 0.375 ug/kg/min via INTRAVENOUS
  Filled 2014-03-03 (×8): qty 100

## 2014-03-03 NOTE — Progress Notes (Signed)
ANTICOAGULATION CONSULT NOTE - Initial Consult  Pharmacy Consult for Warfarin Indication: atrial fibrillation  Allergies  Allergen Reactions  . Lisinopril Rash    Patient Measurements: Height: 5\' 8"  (172.7 cm) Weight: 160 lb 15 oz (73 kg) IBW/kg (Calculated) : 68.4  Vital Signs: Temp: 98.2 F (36.8 C) (09/14 0400) Temp src: Oral (09/14 0400) BP: 93/46 mmHg (09/14 0700) Pulse Rate: 80 (09/14 0700)  Labs:  Recent Labs  03/01/14 1959 03/02/14 0727 03/03/14 0500  HGB 12.8*  --  12.2*  HCT 36.8*  --  35.0*  PLT 184  --  166  LABPROT 36.1*  --  28.6*  INR 3.62*  --  2.69*  CREATININE 6.58* 6.40* 4.16*  TROPONINI 0.97*  --   --     Estimated Creatinine Clearance: 13.9 ml/min (by C-G formula based on Cr of 4.16).   Medical History: Past Medical History  Diagnosis Date  . HTN (hypertension)   . High cholesterol   . CHF (congestive heart failure)   . BBBB (bilateral bundle branch block)   . LBBB (left bundle branch block)   . Weakness   . Tired   . Seizures 06/11/2012    new onset/notes (06/11/2012)  . SOB (shortness of breath)     "sometimes when I lay down;; related to not taking my RX" (06/11/2012)  . Myocardial infarction     06/10/2012  . Atrial fibrillation   . Stroke   . Atrial thrombus     left  . Pulmonary HTN     Medications:  Prescriptions prior to admission  Medication Sig Dispense Refill  . amiodarone (PACERONE) 200 MG tablet Take 1 tablet (200 mg total) by mouth 2 (two) times daily.  60 tablet  6  . Choline Fenofibrate 45 MG capsule Take 45 mg by mouth daily.      Marland Kitchen loratadine (CLARITIN) 10 MG tablet Take 10 mg by mouth daily.      Marland Kitchen losartan (COZAAR) 25 MG tablet Take 25 mg by mouth daily.      . magnesium oxide (MAG-OX) 400 MG tablet Take 400 mg by mouth daily.      . metolazone (ZAROXOLYN) 2.5 MG tablet Take 2.5 mg by mouth daily as needed (according to instructions).      . milrinone (PRIMACOR) 20 MG/100ML SOLN infusion Inject 26.775  mcg/min into the vein continuous.  100 mL  6  . potassium chloride SA (K-DUR,KLOR-CON) 20 MEQ tablet Take 1 tablet (20 mEq total) by mouth 2 (two) times daily. TAKE EXTRA TAB WHEN YOU TAKE METOLAZONE  60 tablet  3  . simvastatin (ZOCOR) 40 MG tablet Take 1 tablet (40 mg total) by mouth every evening.  30 tablet  6  . spironolactone (ALDACTONE) 25 MG tablet Take 25 mg by mouth daily.      . Tadalafil, PAH, (ADCIRCA) 20 MG TABS Take 40 mg by mouth daily.      Marland Kitchen torsemide (DEMADEX) 20 MG tablet Take 40 mg by mouth every evening.       . traMADol (ULTRAM) 50 MG tablet Take 50 mg by mouth every 12 (twelve) hours as needed for moderate pain.      Marland Kitchen warfarin (COUMADIN) 4 MG tablet Take 4 mg by mouth daily.        Assessment: 80 yo M admitted on 03/01/2014 with n/v x 4 days and progressive SOB. Last OP notes indicate was to decrease torsemide but did not do this.  Pharmacy consulted to resume warfarin for  atrial fibrilation  PMH: HTN/HLD, HFrEF, NICM, LBBB, sz, ?CAD s/p MI in 2013, PAF, CVA, left atrial thrombus, PAH  Coag/Heme - PAF - goal: rate/rhythm control, INR 2-3 INR elevated on admission(3.62)>2.69 , PTA warfarin 4 mg daily, has now missed 2 days. Vpaced 80, [name] 90s On amio 200 BID, MagOx 400 daily,   Goal of Therapy:  INR 2-3 Monitor platelets by anticoagulation protocol: Yes   Plan:  Warfarin 4 mg PO today Daily INR  Thank you for allowing pharmacy to be a part of this patients care team.  Rowe Robert Pharm.D., BCPS, AQ-Cardiology Clinical Pharmacist 03/03/2014 7:30 AM Pager: [phone] Phone: [phone]

## 2014-03-03 NOTE — Progress Notes (Signed)
Advanced Heart Failure Rounding Note   Subjective:    80 y.o. male w/ PMHx significant for NICM with EF of 20% on chronic milrinone, HTN, pulm HTN, chronic renal insufficiency  (baseline CR 1.8) admitted 9/12 with ab pain/distension and acute renal failure Cr 6.4  Admitted to the hospital 5/21-11/22/13. Found to have staph aureus bacteremia treated with IV abx. TEE negative for endocarditis. Also found to retroperitoneal mass and biopsy revealed benign lesion. He had evidence of low output thought to be due to atrial fibrillation and milrinone increased. Attempted TEE and DC-CV but had LAA clot so deferred cardioversion.  Stable overnight. Co-ox 68% and CVP 7. Weight down 2 lbs. Renal function trending down, Cr 4.16. WBC up 15.5.  [name] 70-100s   Objective:   Weight Range:  Vital Signs:   Temp:  [97.3 F (36.3 C)-98.7 F (37.1 C)] 98.2 F (36.8 C) (09/14 0400) Pulse Rate:  [73-109] 73 (09/14 0600) Resp:  [14-34] 16 (09/14 0600) BP: (71-117)/(33-55) 101/42 mmHg (09/14 0600) SpO2:  [92 %-100 %] 98 % (09/14 0600) Weight:  [160 lb 15 oz (73 kg)] 160 lb 15 oz (73 kg) (09/14 0500) Last BM Date: 03/02/14  Weight change: Filed Weights   03/02/14 0149 03/03/14 0500  Weight: 162 lb 4.1 oz (73.6 kg) 160 lb 15 oz (73 kg)    Intake/Output:   Intake/Output Summary (Last 24 hours) at 03/03/14 0706 Last data filed at 03/03/14 0600  Gross per 24 hour  Intake    986 ml  Output   1100 ml  Net   -114 ml     Physical Exam: CVP 6 General:  Elderly man. Lying in bed. No resp difficulty HEENT: normal Neck: supple. JVP flat . Carotids 2+ bilat; no bruits. No lymphadenopathy or thryomegaly appreciated. Cor: PMI laterally displaced. Distant regular.  Lungs: clear Abdomen: soft, nontender, markedly distended. Good bowel sounds, hyperactive BS Extremities: no cyanosis, clubbing, rash, edema Neuro: alert & orientedx3, cranial nerves grossly intact. moves all 4 extremities w/o difficulty. Affect  pleasant  Telemetry: SR 80s  Labs: Basic Metabolic Panel:  Recent Labs Lab 03/01/14 1959 03/02/14 0727 03/03/14 0500  NA 135* 133* 134*  K 3.9 4.0 3.5*  CL 93* 92* 93*  CO2 21 23 26   GLUCOSE 128* 161* 142*  BUN 108* 109* 97*  CREATININE 6.58* 6.40* 4.16*  CALCIUM 8.8 8.4 8.7    Liver Function Tests:  Recent Labs Lab 03/01/14 1959  AST 43*  ALT 50  ALKPHOS 152*  BILITOT 0.4  PROT 7.2  ALBUMIN 3.6   No results found for this basename: LIPASE, AMYLASE,  in the last 168 hours No results found for this basename: AMMONIA,  in the last 168 hours  CBC:  Recent Labs Lab 03/01/14 1959 03/03/14 0500  WBC 11.9* 15.5*  HGB 12.8* 12.2*  HCT 36.8* 35.0*  MCV 90.6 91.1  PLT 184 166    Cardiac Enzymes:  Recent Labs Lab 03/01/14 1959  TROPONINI 0.97*    BNP: BNP (last 3 results)  Recent Labs  11/07/13 0708 03/01/14 1959  PROBNP 9356.0* 11880.0*     Other results:  EKG:   Imaging: Ct Abdomen Pelvis Wo Contrast  03/02/2014   CLINICAL DATA:  Abdominal pain.  Bloating and distention appearance  EXAM: CT ABDOMEN AND PELVIS WITHOUT CONTRAST  TECHNIQUE: Multidetector CT imaging of the abdomen and pelvis was performed following the standard protocol without IV contrast.  COMPARISON:  CT of the abdomen and pelvis 11/07/2013.  FINDINGS:  Lung Bases: Patchy opacities are noted throughout the lung bases bilaterally, favored to predominantly reflect subsegmental atelectasis. Moderate cardiomegaly. Atherosclerotic calcifications in the left main, left anterior descending, left circumflex and right coronary arteries. Biventricular pacemaker device noted. Small hiatal hernia.  Abdomen/Pelvis: There is a small amount of high attenuation material layering dependently in the gallbladder, compatible with biliary sludge. No current findings to suggest acute cholecystitis at this time. The unenhanced appearance of the liver, pancreas and bilateral adrenal glands is unremarkable.  Numerous calcifications in the spleen are compatible with calcified granulomas. Numerous low-attenuation lesions in the kidneys bilaterally are incompletely characterized on today's non contrast CT examination, but appears similar to prior examinations, favored to represent multiple cysts, largest of which measures 5 cm in diameter in the anterior aspect of the interpolar region of the left kidney.  In the medial aspect of the right retroperitoneum there is a mixed lipid and soft tissue mass which has increased in size compared to the prior study, currently measuring 5.0 x 2.7 x 6.5 cm, highly suspicious for a liposarcoma. Atherosclerosis throughout the abdominal and pelvic vasculature, without evidence of aneurysm. Numerous colonic diverticulae are noted, without surrounding inflammatory changes to suggest an acute diverticulitis at this time. No significant volume of ascites. No pneumoperitoneum. No pathologic distention of small bowel. No definite lymphadenopathy noted in the abdomen or pelvis on today's non contrast CT examination. Small bilateral inguinal hernias containing only fat (left greater than right) are incidentally noted. Prostate gland and urinary bladder are unremarkable in appearance.  Musculoskeletal: There are no aggressive appearing lytic or blastic lesions noted in the visualized portions of the skeleton.  IMPRESSION: 1. No acute findings in the abdomen or pelvis to account for the patient's symptoms. 2. 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 Surgical consultation is recommended. 3. Colonic diverticulosis without findings to suggest acute diverticulitis at this time. 4. Extensive atherosclerosis, including left main and 3 vessel coronary artery disease. 5. Additional incidental findings, as above.   Electronically Signed   By: Vinnie Langton M.D.   On: 03/02/2014 14:02   US Renal  03/02/2014    CLINICAL DATA:  Acute renal failure  EXAM: RENAL/URINARY TRACT ULTRASOUND COMPLETE  COMPARISON:  None.  FINDINGS: Right Kidney:  Length: 12.8 cm. Cyst within the upper pole measures 2.9 x 3.7 x 3.1 cm. Echogenicity within normal limits. No mass or hydronephrosis visualized.  Left Kidney:  Length: 11.9 cm. Cyst within the mid left kidney measures 4.7 x 4.5 x 5.1 cm. Echogenicity within normal limits. No mass or hydronephrosis visualized.  Bladder:  Appears normal for degree of bladder distention.  IMPRESSION: 1. No evidence for hydronephrosis. 2. Bilateral renal cysts.   Electronically Signed   By: Kerby Moors M.D.   On: 03/02/2014 13:22   Dg Chest Port 1 View  03/01/2014   CLINICAL DATA:  Short of breath.  Weakness.  EXAM: PORTABLE CHEST - 1 VIEW  COMPARISON:  11/13/2013.  FINDINGS: Support apparatus: RIGHT upper extremity PICC is present with the tip in the upper SVC. Three lead LEFT subclavian AICD with coronary sinus lead.  Cardiomediastinal Silhouette: Cardiomegaly. Aortic arch atherosclerosis.  Lungs: Basilar atelectasis and airspace disease. Airspace disease is most compatible with pulmonary edema. The airspace disease is symmetric. No pneumothorax.  Effusions:  Small RIGHT pleural effusion  Other:  Monitoring leads project over the chest.  IMPRESSION: Mild to moderate CHF.   Electronically Signed  By: Dereck Ligas M.D.   On: 03/01/2014 20:23     Medications:     Scheduled Medications: . amiodarone  200 mg Oral BID  . antiseptic oral rinse  7 mL Mouth Rinse BID  . atorvastatin  20 mg Oral q1800  . magnesium oxide  400 mg Oral Daily  . sodium chloride  3 mL Intravenous Q12H  . Tadalafil (PAH)  40 mg Oral Daily    Infusions: . milrinone 0.375 mcg/kg/min (03/02/14 2246)    PRN Medications: sodium chloride, acetaminophen, ondansetron (ZOFRAN) IV, sodium chloride, traMADol   Assessment:   1. Acute on chronic renal failure, stage V 2. Advanced chronic systolic HF d/t NICM EF  42% 3. Pulmonary HTN 4. PAF now in NSR on coumadin 5. Abdominal pain and distension  6. Hypotension 7. Retroperitoneal mass, enlarging - ? liposarcoma  Plan/Discussion:    He has marked ab distension with acute renal failure. He is not volume overloaded and CVP is 7. Will continue to hold diuretics. Co-ox 68% will continue milrinone 0.375 mcg. Will hold tadalafil currently with CrCl < 30.   [name] remains low, hopefully with holding tadalafil this will help.    Still having abdominal pain. He has hyperactive BS and was getting up to go to the bathroom this morning. WBC trending up will continue to follow. UA negative and no fevers.  Given severe HF he is not candidate for outpatient HD. So will hold off on contacting Renal. US shows R renal 12.8 cm with cyct 2.9 x 3.7 x 3.1 upper pole and L renal 11.8 with cyst mid kidney 4.7 x 4.5 x 5.1.   Length of Stay: 2 Rande Brunt MD 03/03/2014, 7:06 AM  Advanced Heart Failure Team Pager [phone] (M-F; 7a - 4p)  Please contact [facility] for night-coverage after hours (4p -7a ) and weekends on [email or web address]  Patient seen and examined with Junie Bame, NP. We discussed all aspects of the encounter. I agree with the assessment and plan as stated above.   Renal function continues to improve. CVP and co-ox ok. Suspect had episode of ATN and now improving. 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 He is not candidate for chemo or surgery in face of severe HF and renal failure. Will continue to follow. Continue milrinone support. Hold diuretics.   Elmin Wiederholt,MD 1:52 PM

## 2014-03-03 NOTE — Progress Notes (Signed)
Interpreter Tyler Christensen for Patient °

## 2014-03-03 NOTE — Progress Notes (Signed)
[facility]  Patient Status: Active pt with [facility]    [facility] is providing the following services: HHRN and Home Infusion Pharmacy for home Milrinone. [facility] hospital team will follow pt while inpatient and support DC home when appropriate.   If patient discharges after hours, please call [phone].   Larry Sierras 03/03/2014, 10:53 AM

## 2014-03-04 DIAGNOSIS — N184 Chronic kidney disease, stage 4 (severe): Secondary | ICD-10-CM

## 2014-03-04 DIAGNOSIS — N179 Acute kidney failure, unspecified: Secondary | ICD-10-CM

## 2014-03-04 LAB — BASIC METABOLIC PANEL
ANION GAP: 12 (ref 5–15)
BUN: 70 mg/dL — ABNORMAL HIGH (ref 6–23)
CHLORIDE: 98 meq/L (ref 96–112)
CO2: 28 meq/L (ref 19–32)
Calcium: 8.6 mg/dL (ref 8.4–10.5)
Creatinine, Ser: 2.53 mg/dL — ABNORMAL HIGH (ref 0.50–1.35)
GFR calc non Af Amer: 23 mL/min — ABNORMAL LOW (ref >90)
GFR, EST AFRICAN AMERICAN: 26 mL/min — AB (ref >90)
Glucose, Bld: 104 mg/dL — ABNORMAL HIGH (ref 70–99)
POTASSIUM: 3.4 meq/L — AB (ref 3.7–5.3)
Sodium: 138 mEq/L (ref 137–147)

## 2014-03-04 LAB — CARBOXYHEMOGLOBIN
Carboxyhemoglobin: 1.3 % (ref 0.5–1.5)
METHEMOGLOBIN: 0.4 % (ref 0.0–1.5)
O2 SAT: 70.1 %
Total hemoglobin: 11.9 g/dL — ABNORMAL LOW (ref 13.5–18.0)

## 2014-03-04 LAB — PROTIME-INR
INR: 2.3 — ABNORMAL HIGH (ref 0.00–1.49)
Prothrombin Time: 25.3 seconds — ABNORMAL HIGH (ref 11.6–15.2)

## 2014-03-04 LAB — CLOSTRIDIUM DIFFICILE BY PCR: Toxigenic C. Difficile by PCR: NEGATIVE

## 2014-03-04 MED ORDER — WARFARIN SODIUM 6 MG PO TABS
6.0000 mg | ORAL_TABLET | Freq: Once | ORAL | Status: AC
  Administered 2014-03-04: 6 mg via ORAL
  Filled 2014-03-04: qty 1

## 2014-03-04 MED ORDER — SILDENAFIL CITRATE 20 MG PO TABS
20.0000 mg | ORAL_TABLET | Freq: Three times a day (TID) | ORAL | Status: DC
  Administered 2014-03-04 – 2014-03-07 (×10): 20 mg via ORAL
  Filled 2014-03-04 (×13): qty 1

## 2014-03-04 MED ORDER — POTASSIUM CHLORIDE CRYS ER 20 MEQ PO TBCR
40.0000 meq | EXTENDED_RELEASE_TABLET | Freq: Two times a day (BID) | ORAL | Status: AC
  Administered 2014-03-04 (×2): 40 meq via ORAL
  Filled 2014-03-04 (×2): qty 2

## 2014-03-04 NOTE — Progress Notes (Signed)
ANTICOAGULATION CONSULT NOTE - Follow Up Consult  Pharmacy Consult for Warfarin Indication: atrial fibrillation  Allergies  Allergen Reactions  . Lisinopril Rash    Patient Measurements: Height: 5\' 8"  (172.7 cm) Weight: 158 lb 14.4 oz (72.077 kg) IBW/kg (Calculated) : 68.4  Vital Signs: Temp: 97.6 F (36.4 C) (09/15 0400) Temp src: Oral (09/15 0400) BP: 104/48 mmHg (09/15 0700) Pulse Rate: 73 (09/15 0700)  Labs:  Recent Labs  03/01/14 1959 03/02/14 0727 03/03/14 0500 03/04/14 0420  HGB 12.8*  --  12.2*  --   HCT 36.8*  --  35.0*  --   PLT 184  --  166  --   LABPROT 36.1*  --  28.6* 25.3*  INR 3.62*  --  2.69* 2.30*  CREATININE 6.58* 6.40* 4.16* 2.53*  TROPONINI 0.97*  --   --   --     Estimated Creatinine Clearance: 22.9 ml/min (by C-G formula based on Cr of 2.53).   Medical History: Past Medical History  Diagnosis Date  . HTN (hypertension)   . High cholesterol   . CHF (congestive heart failure)   . BBBB (bilateral bundle branch block)   . LBBB (left bundle branch block)   . Weakness   . Tired   . Seizures 06/11/2012    new onset/notes (06/11/2012)  . SOB (shortness of breath)     "sometimes when I lay down;; related to not taking my RX" (06/11/2012)  . Myocardial infarction     06/10/2012  . Atrial fibrillation   . Stroke   . Atrial thrombus     left  . Pulmonary HTN     Medications:  Prescriptions prior to admission  Medication Sig Dispense Refill  . amiodarone (PACERONE) 200 MG tablet Take 1 tablet (200 mg total) by mouth 2 (two) times daily.  60 tablet  6  . Choline Fenofibrate 45 MG capsule Take 45 mg by mouth daily.      Marland Kitchen loratadine (CLARITIN) 10 MG tablet Take 10 mg by mouth daily.      Marland Kitchen losartan (COZAAR) 25 MG tablet Take 25 mg by mouth daily.      . magnesium oxide (MAG-OX) 400 MG tablet Take 400 mg by mouth daily.      . metolazone (ZAROXOLYN) 2.5 MG tablet Take 2.5 mg by mouth daily as needed (according to instructions).      .  milrinone (PRIMACOR) 20 MG/100ML SOLN infusion Inject 26.775 mcg/min into the vein continuous.  100 mL  6  . potassium chloride SA (K-DUR,KLOR-CON) 20 MEQ tablet Take 1 tablet (20 mEq total) by mouth 2 (two) times daily. TAKE EXTRA TAB WHEN YOU TAKE METOLAZONE  60 tablet  3  . simvastatin (ZOCOR) 40 MG tablet Take 1 tablet (40 mg total) by mouth every evening.  30 tablet  6  . spironolactone (ALDACTONE) 25 MG tablet Take 25 mg by mouth daily.      . Tadalafil, PAH, (ADCIRCA) 20 MG TABS Take 40 mg by mouth daily.      Marland Kitchen torsemide (DEMADEX) 20 MG tablet Take 40 mg by mouth every evening.       . traMADol (ULTRAM) 50 MG tablet Take 50 mg by mouth every 12 (twelve) hours as needed for moderate pain.      Marland Kitchen warfarin (COUMADIN) 4 MG tablet Take 4 mg by mouth daily.        Assessment: 80 yo M admitted on 03/01/2014 with n/v x 4 days and progressive SOB. Last OP  notes indicate was to decrease torsemide but did not do this.  Pharmacy consulted to resume warfarin for atrial fibrilation  PMH: HTN/HLD, HFrEF, NICM, LBBB, sz, ?CAD s/p MI in 2013, PAF, CVA, left atrial thrombus, PAH  Coag/Heme - PAF - goal: rate/rhythm control, INR 2-3 INR elevated on admission(3.62)>2.30 , PTA warfarin 4 mg daily, missed 2 days on admission. Vpaced 80, [name] 90s On amio 200 BID, MagOx 400 daily,   Goal of Therapy:  INR 2-3 Monitor platelets by anticoagulation protocol: Yes   Plan:  Warfarin 6 mg PO today (1.5x home dose for 1day since perfusion improving) Daily INR  Thank you for allowing pharmacy to be a part of this patients care team.  Rowe Robert Pharm.D., BCPS, AQ-Cardiology Clinical Pharmacist 03/04/2014 7:28 AM Pager: [phone] Phone: [phone]

## 2014-03-04 NOTE — Progress Notes (Signed)
Interpreter Lesle Chris for Neale Burly, and Stanton Kidney, RN From Palliative care.

## 2014-03-04 NOTE — Progress Notes (Signed)
CARE MANAGEMENT NOTE 03/04/2014  Patient:  Tyler Christensen, Tyler Christensen   Account Number:  [id number]  Date Initiated:  03/04/2014  Documentation initiated by:  DAVIS,RHONDA  Subjective/Objective Assessment:   80 y.o. male w/ PMHx significant for NICM with EF of 20% on chronic milrinone, HTN, pulm HTN, chronic renal insufficiency  (baseline CR 1.8) admitted 9/12 with ab pain/distension and acute renal failure Cr 6.4     Action/Plan:   home on iv milrinone   Anticipated DC Date:  03/05/2014   Anticipated DC Plan:  [place]  In-house referral  NA      DC Planning Services  CM consult      PAC Choice  NA   Choice offered to / List presented to:  NA   DME arranged  IV PUMP/EQUIPMENT      DME agency  [place] arranged  HH-1 RN      [place].   Status of service:  In process, will continue to follow Medicare Important Message given?   (If response is "NO", the following Medicare IM given date fields will be blank) Date Medicare IM given:   Medicare IM given by:   Date Additional Medicare IM given:   Additional Medicare IM given by:    Discharge Disposition:    Per UR Regulation:  Reviewed for med. necessity/level of care/duration of stay  If discussed at [place] of Stay Meetings, dates discussed:    Comments:  [id number]/Rhonda Davis,RN,BSN,CCM

## 2014-03-04 NOTE — Progress Notes (Signed)
Advanced Heart Failure Rounding Note   Subjective:    80 y.o. male w/ PMHx significant for NICM with EF of 20% on chronic milrinone, HTN, pulm HTN, chronic renal insufficiency  (baseline CR 1.8) admitted 9/12 with ab pain/distension and acute renal failure Cr 6.4  Admitted to the hospital 5/21-11/22/13. Found to have staph aureus bacteremia treated with IV abx. TEE negative for endocarditis. Also found to retroperitoneal mass and biopsy revealed benign lesion. He had evidence of low output thought to be due to atrial fibrillation and milrinone increased. Attempted TEE and DC-CV but had LAA clot so deferred cardioversion.  Stable overnight. Tadalafil has been on hold with CrCl < 30. [name] 90-100s. Remains on milrinone 0.375 and co-ox 70%. Weight down 2 lbs. Renal function continues to improve, 2.53.   Objective:   Weight Range:  Vital Signs:   Temp:  [97.4 F (36.3 C)-97.9 F (36.6 C)] 97.6 F (36.4 C) (09/15 0400) Pulse Rate:  [68-81] 73 (09/15 0700) Resp:  [12-26] 15 (09/15 0700) BP: (83-111)/(37-59) 101/45 mmHg (09/15 0600) SpO2:  [95 %-100 %] 99 % (09/15 0700) Weight:  [158 lb 14.4 oz (72.077 kg)] 158 lb 14.4 oz (72.077 kg) (09/15 0445) Last BM Date: 03/03/14  Weight change: Filed Weights   03/02/14 0149 03/03/14 0500 03/04/14 0445  Weight: 162 lb 4.1 oz (73.6 kg) 160 lb 15 oz (73 kg) 158 lb 14.4 oz (72.077 kg)    Intake/Output:   Intake/Output Summary (Last 24 hours) at 03/04/14 0704 Last data filed at 03/04/14 0700  Gross per 24 hour  Intake 1426.9 ml  Output   2000 ml  Net -573.1 ml     Physical Exam: CVP 6 General:  Elderly man. Lying in bed. No resp difficulty HEENT: normal Neck: supple. JVP flat . Carotids 2+ bilat; no bruits. No lymphadenopathy or thryomegaly appreciated. Cor: PMI laterally displaced. Distant regular.  Lungs: clear Abdomen: soft, nontender, markedly distended. Good bowel sounds, hyperactive BS Extremities: no cyanosis, clubbing, rash,  edema Neuro: alert & orientedx3, cranial nerves grossly intact. moves all 4 extremities w/o difficulty. Affect pleasant  Telemetry: SR 80s  Labs: Basic Metabolic Panel:  Recent Labs Lab 03/01/14 1959 03/02/14 0727 03/03/14 0500 03/04/14 0420  NA 135* 133* 134* 138  K 3.9 4.0 3.5* 3.4*  CL 93* 92* 93* 98  CO2 21 23 26 28   GLUCOSE 128* 161* 142* 104*  BUN 108* 109* 97* 70*  CREATININE 6.58* 6.40* 4.16* 2.53*  CALCIUM 8.8 8.4 8.7 8.6    Liver Function Tests:  Recent Labs Lab 03/01/14 1959  AST 43*  ALT 50  ALKPHOS 152*  BILITOT 0.4  PROT 7.2  ALBUMIN 3.6   No results found for this basename: LIPASE, AMYLASE,  in the last 168 hours No results found for this basename: AMMONIA,  in the last 168 hours  CBC:  Recent Labs Lab 03/01/14 1959 03/03/14 0500  WBC 11.9* 15.5*  HGB 12.8* 12.2*  HCT 36.8* 35.0*  MCV 90.6 91.1  PLT 184 166    Cardiac Enzymes:  Recent Labs Lab 03/01/14 1959  TROPONINI 0.97*    BNP: BNP (last 3 results)  Recent Labs  11/07/13 0708 03/01/14 1959  PROBNP 9356.0* 11880.0*     Other results:  EKG:   Imaging: Ct Abdomen Pelvis Wo Contrast  03/02/2014   CLINICAL DATA:  Abdominal pain.  Bloating and distention appearance  EXAM: CT ABDOMEN AND PELVIS WITHOUT CONTRAST  TECHNIQUE: Multidetector CT imaging of the abdomen and pelvis was  performed following the standard protocol without IV contrast.  COMPARISON:  CT of the abdomen and pelvis 11/07/2013.  FINDINGS: Lung Bases: Patchy opacities are noted throughout the lung bases bilaterally, favored to predominantly reflect subsegmental atelectasis. Moderate cardiomegaly. Atherosclerotic calcifications in the left main, left anterior descending, left circumflex and right coronary arteries. Biventricular pacemaker device noted. Small hiatal hernia.  Abdomen/Pelvis: There is a small amount of high attenuation material layering dependently in the gallbladder, compatible with biliary sludge. No  current findings to suggest acute cholecystitis at this time. The unenhanced appearance of the liver, pancreas and bilateral adrenal glands is unremarkable. Numerous calcifications in the spleen are compatible with calcified granulomas. Numerous low-attenuation lesions in the kidneys bilaterally are incompletely characterized on today's non contrast CT examination, but appears similar to prior examinations, favored to represent multiple cysts, largest of which measures 5 cm in diameter in the anterior aspect of the interpolar region of the left kidney.  In the medial aspect of the right retroperitoneum there is a mixed lipid and soft tissue mass which has increased in size compared to the prior study, currently measuring 5.0 x 2.7 x 6.5 cm, highly suspicious for a liposarcoma. Atherosclerosis throughout the abdominal and pelvic vasculature, without evidence of aneurysm. Numerous colonic diverticulae are noted, without surrounding inflammatory changes to suggest an acute diverticulitis at this time. No significant volume of ascites. No pneumoperitoneum. No pathologic distention of small bowel. No definite lymphadenopathy noted in the abdomen or pelvis on today's non contrast CT examination. Small bilateral inguinal hernias containing only fat (left greater than right) are incidentally noted. Prostate gland and urinary bladder are unremarkable in appearance.  Musculoskeletal: There are no aggressive appearing lytic or blastic lesions noted in the visualized portions of the skeleton.  IMPRESSION: 1. No acute findings in the abdomen or pelvis to account for the patient's symptoms. 2. 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 Surgical consultation is recommended. 3. Colonic diverticulosis without findings to suggest acute diverticulitis at this time. 4. Extensive atherosclerosis, including left main and 3 vessel coronary artery  disease. 5. Additional incidental findings, as above.   Electronically Signed   By: Vinnie Langton M.D.   On: 03/02/2014 14:02   US Renal  03/02/2014   CLINICAL DATA:  Acute renal failure  EXAM: RENAL/URINARY TRACT ULTRASOUND COMPLETE  COMPARISON:  None.  FINDINGS: Right Kidney:  Length: 12.8 cm. Cyst within the upper pole measures 2.9 x 3.7 x 3.1 cm. Echogenicity within normal limits. No mass or hydronephrosis visualized.  Left Kidney:  Length: 11.9 cm. Cyst within the mid left kidney measures 4.7 x 4.5 x 5.1 cm. Echogenicity within normal limits. No mass or hydronephrosis visualized.  Bladder:  Appears normal for degree of bladder distention.  IMPRESSION: 1. No evidence for hydronephrosis. 2. Bilateral renal cysts.   Electronically Signed   By: Kerby Moors M.D.   On: 03/02/2014 13:22     Medications:     Scheduled Medications: . amiodarone  200 mg Oral BID  . antiseptic oral rinse  7 mL Mouth Rinse BID  . atorvastatin  20 mg Oral q1800  . magnesium oxide  400 mg Oral Daily  . sodium chloride  3 mL Intravenous Q12H  . Warfarin - Pharmacist Dosing Inpatient   Does not apply q1800    Infusions: . milrinone 0.375 mcg/kg/min (03/04/14 0038)    PRN Medications: sodium chloride, acetaminophen, ondansetron (ZOFRAN) IV, sodium chloride, traMADol  Assessment:   1. Acute on chronic renal failure, stage V 2. Advanced chronic systolic HF d/t NICM EF 17% 3. Pulmonary HTN 4. PAF now in NSR on coumadin 5. Abdominal pain and distension  6. Hypotension 7. 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 Diuretics have been on hold and he is not volume overloaded. CVP 4 this am and weight down 2 lbs. Will continue to hold diuretics. Co-ox 70%, will continue milrinone 0.375 mcg.   Will continue to hold tadalafil currently with CrCl < 30, his CrCl fluctuates quite a bit and maybe a better  option to use Revatio. Will discuss with Dr. Haroldine Laws.  Concern that his abdominal distention is from enlarging liposarcoma. 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 Unfortunately he is nota candidate for chemo or surgery with his severe HF. Will need to get Palliative Care on board to help discuss goals of care and symptom management.    Length of Stay: 3 Rande Brunt NP-C 03/04/2014, 7:04 AM  Advanced Heart Failure Team Pager [phone] (M-F; 7a - 4p)  Please contact [facility] for night-coverage after hours (4p -7a ) and weekends on [email or web address]  Patient seen and examined with Junie Bame, NP. We discussed all aspects of the encounter. I agree with the assessment and plan as stated above.   He feels better. Renal function improving. Will continue to hold diuretics one more day. Agree with initiating Palliative Care discussions. Will need interpreter to help. Given low CrCl agree with switching tadalafil to sildenafil. Likely home tomorrow.   Madilynn Montante,MD 9:27 AM

## 2014-03-05 DIAGNOSIS — Z7189 Other specified counseling: Secondary | ICD-10-CM

## 2014-03-05 DIAGNOSIS — R5381 Other malaise: Secondary | ICD-10-CM

## 2014-03-05 DIAGNOSIS — Z515 Encounter for palliative care: Secondary | ICD-10-CM

## 2014-03-05 DIAGNOSIS — R5383 Other fatigue: Secondary | ICD-10-CM

## 2014-03-05 LAB — BASIC METABOLIC PANEL
Anion gap: 10 (ref 5–15)
BUN: 34 mg/dL — ABNORMAL HIGH (ref 6–23)
CALCIUM: 8.2 mg/dL — AB (ref 8.4–10.5)
CO2: 28 mEq/L (ref 19–32)
Chloride: 99 mEq/L (ref 96–112)
Creatinine, Ser: 1.44 mg/dL — ABNORMAL HIGH (ref 0.50–1.35)
GFR calc non Af Amer: 45 mL/min — ABNORMAL LOW (ref >90)
GFR, EST AFRICAN AMERICAN: 52 mL/min — AB (ref >90)
Glucose, Bld: 178 mg/dL — ABNORMAL HIGH (ref 70–99)
POTASSIUM: 3.7 meq/L (ref 3.7–5.3)
SODIUM: 137 meq/L (ref 137–147)

## 2014-03-05 LAB — CARBOXYHEMOGLOBIN
CARBOXYHEMOGLOBIN: 1.2 % (ref 0.5–1.5)
METHEMOGLOBIN: 0.5 % (ref 0.0–1.5)
O2 Saturation: 93.3 %
Total hemoglobin: 12.5 g/dL — ABNORMAL LOW (ref 13.5–18.0)

## 2014-03-05 LAB — CBC
HCT: 36.5 % — ABNORMAL LOW (ref 39.0–52.0)
Hemoglobin: 12.1 g/dL — ABNORMAL LOW (ref 13.0–17.0)
MCH: 30.9 pg (ref 26.0–34.0)
MCHC: 33.2 g/dL (ref 30.0–36.0)
MCV: 93.1 fL (ref 78.0–100.0)
Platelets: 173 10*3/uL (ref 150–400)
RBC: 3.92 MIL/uL — AB (ref 4.22–5.81)
RDW: 14.7 % (ref 11.5–15.5)
WBC: 14.9 10*3/uL — AB (ref 4.0–10.5)

## 2014-03-05 LAB — PROTIME-INR
INR: 2.55 — ABNORMAL HIGH (ref 0.00–1.49)
PROTHROMBIN TIME: 27.4 s — AB (ref 11.6–15.2)

## 2014-03-05 MED ORDER — SODIUM CHLORIDE 0.9 % IV BOLUS (SEPSIS)
250.0000 mL | Freq: Once | INTRAVENOUS | Status: AC
  Administered 2014-03-05: 250 mL via INTRAVENOUS

## 2014-03-05 MED ORDER — WARFARIN SODIUM 4 MG PO TABS
4.0000 mg | ORAL_TABLET | Freq: Once | ORAL | Status: AC
  Administered 2014-03-05: 4 mg via ORAL
  Filled 2014-03-05: qty 1

## 2014-03-05 NOTE — Progress Notes (Signed)
Advanced Heart Failure Rounding Note   Subjective:    80 y.o. male w/ PMHx significant for NICM with EF of 20% on chronic milrinone, HTN, pulm HTN, chronic renal insufficiency  (baseline CR 1.8) admitted 9/12 with ab pain/distension and acute renal failure Cr 6.4  Admitted to the hospital 5/21-11/22/13. Found to have staph aureus bacteremia treated with IV abx. TEE negative for endocarditis. Also found to retroperitoneal mass and biopsy revealed benign lesion. He had evidence of low output thought to be due to atrial fibrillation and milrinone increased. Attempted TEE and DC-CV but had LAA clot so deferred cardioversion.  Stable overnight. Tadalafil has been on hold with CrCl < 30. [name] 90-120s. Remains on milrinone 0.375 and co-ox 90%. Weight up 2 lbs. Pending renal function panel. Patient feels very weak and like he is going to fall when he gets up.    Objective:   Weight Range:  Vital Signs:   Temp:  [98.2 F (36.8 C)-98.3 F (36.8 C)] 98.2 F (36.8 C) (09/16 0410) Pulse Rate:  [68-81] 76 (09/16 0410) Resp:  [14-27] 26 (09/16 0600) BP: (86-128)/(24-67) 97/41 mmHg (09/16 0600) SpO2:  [95 %-99 %] 95 % (09/16 0410) Weight:  [160 lb 7.9 oz (72.8 kg)] 160 lb 7.9 oz (72.8 kg) (09/16 0410) Last BM Date: 03/04/14  Weight change: Filed Weights   03/03/14 0500 03/04/14 0445 03/05/14 0410  Weight: 160 lb 15 oz (73 kg) 158 lb 14.4 oz (72.077 kg) 160 lb 7.9 oz (72.8 kg)    Intake/Output:   Intake/Output Summary (Last 24 hours) at 03/05/14 4081 Last data filed at 03/05/14 0600  Gross per 24 hour  Intake 1268.6 ml  Output   1601 ml  Net -332.4 ml     Physical Exam: CVP 4-5 General:  Elderly man. Lying in bed. No resp difficulty HEENT: normal Neck: supple. JVP flat . Carotids 2+ bilat; no bruits. No lymphadenopathy or thryomegaly appreciated. Cor: PMI laterally displaced. Distant regular.  Lungs: clear Abdomen: soft, nontender, markedly distended. Good bowel sounds, hyperactive  BS Extremities: no cyanosis, clubbing, rash, edema Neuro: alert & orientedx3, cranial nerves grossly intact. moves all 4 extremities w/o difficulty. Affect pleasant  Telemetry: SR 80s  Labs: Basic Metabolic Panel:  Recent Labs Lab 03/01/14 1959 03/02/14 0727 03/03/14 0500 03/04/14 0420  NA 135* 133* 134* 138  K 3.9 4.0 3.5* 3.4*  CL 93* 92* 93* 98  CO2 21 23 26 28   GLUCOSE 128* 161* 142* 104*  BUN 108* 109* 97* 70*  CREATININE 6.58* 6.40* 4.16* 2.53*  CALCIUM 8.8 8.4 8.7 8.6    Liver Function Tests:  Recent Labs Lab 03/01/14 1959  AST 43*  ALT 50  ALKPHOS 152*  BILITOT 0.4  PROT 7.2  ALBUMIN 3.6   No results found for this basename: LIPASE, AMYLASE,  in the last 168 hours No results found for this basename: AMMONIA,  in the last 168 hours  CBC:  Recent Labs Lab 03/01/14 1959 03/03/14 0500  WBC 11.9* 15.5*  HGB 12.8* 12.2*  HCT 36.8* 35.0*  MCV 90.6 91.1  PLT 184 166    Cardiac Enzymes:  Recent Labs Lab 03/01/14 1959  TROPONINI 0.97*    BNP: BNP (last 3 results)  Recent Labs  11/07/13 0708 03/01/14 1959  PROBNP 9356.0* 11880.0*     Other results:  EKG:   Imaging: No results found.   Medications:     Scheduled Medications: . amiodarone  200 mg Oral BID  . atorvastatin  20  mg Oral q1800  . magnesium oxide  400 mg Oral Daily  . sildenafil  20 mg Oral TID  . sodium chloride  3 mL Intravenous Q12H  . Warfarin - Pharmacist Dosing Inpatient   Does not apply q1800    Infusions: . milrinone 0.375 mcg/kg/min (03/05/14 0400)    PRN Medications: sodium chloride, acetaminophen, ondansetron (ZOFRAN) IV, sodium chloride, traMADol   Assessment:   1. Acute on chronic renal failure, stage V 2. Advanced chronic systolic HF d/t NICM EF 73% 3. Pulmonary HTN 4. PAF now in NSR on coumadin 5. Abdominal pain and distension  6. Hypotension 7. 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 Diuretics have been on hold and he continues to not be volume overloaded. CVP 4-5 this am and weight up 2 lbs. Will continue to hold diuretics and will give 500 cc NS bolus. Co-ox 93%, will continue milrinone 0.375 mcg.   Patient was switched from tadalafil to revatio 20 mg TID yesterday d/t CrCl < 30 and it fluctuates quit a bit at home. Waiting approval.   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 Unfortunately he is nota candidate for chemo or surgery with his severe HF. Yesterday Dr. Haroldine Laws had lengthy discussion with help of interpreter about prognosis.  Patient has pending step-down orders. Was planning on sending him home today, however with his unsteadiness and not being out of bed walking yet will hold off one more day. Consult PT and Cardiac Rehab.   Length of Stay: 4 Rande Brunt NP-C 03/05/2014, 7:02 AM  Advanced Heart Failure Team Pager [phone] (M-F; 7a - 4p)  Please contact [facility] for night-coverage after hours (4p -7a ) and weekends on [email or web address]  Patient seen and examined with Junie Bame, NP. We discussed all aspects of the encounter. I agree with the assessment and plan as stated above.   Improving. Renal function coming back to baseline. OK to give a little fluid back today with low CVP. Long talk about his retroperitoneal mass (via interpreter). He is not candidate for chemo or resection so will not repeat biopsy. Discussed with Rad-Onc and XRT would not be effective. We have engaged Palliative Care to have them on board when needed.   Looks to have constipation on CT. Needs good bowel regimen.   Hopefully home in am.   Benay Spice 5:39 PM

## 2014-03-05 NOTE — Progress Notes (Signed)
Prior-authorization for revatio 20 mg TID. Case # [id number]. Patient's ID number [id number]; [phone]  Junie Bame B 3:56 PM

## 2014-03-05 NOTE — Consult Note (Signed)
Patient Tyler Christensen      DOB: 1932-07-28      FYB:[id number]     Consult Note from the Palliative Medicine Team at [place] Requested by: Junie Bame NP     PCP: Harvie Junior, MD Reason for Consultation:Clarification of [place] and options     Phone Number:[phone]  Assessment of patients Current state:  ES heart failure on home Milrinone.  Continued slow physical and functional decline.  Meet today with Palliative Medicine team for introduction of and our role in holistic patient centered care and discussion of Advanced Directives and anticipatory care needs.   Consult is for review of medical treatment options, clarification of goals of care and options, and symptom recommendations.  This NP Wadie Lessen reviewed medical records, received report from team, assessed the patient and then meet at the patient's bedside  to discuss diagnosis,  prognosis, GOC,  and options.  Spanish interpretor present   A detailed discussion was had today regarding advanced directives.  Concepts specific to code status, artifical feeding and hydration, continued IV antibiotics and rehospitalization was had.  The difference between a aggressive medical intervention path  and a palliative comfort care path for this patient at this time was had.  Values and goals of care important to patient and family were attempted to be elicited.  Concept of  Palliative Care was discussed  Natural trajectory and expectations at EOL were discussed.  Questions and concerns addressed.  Hard Choices booklet left for review. Family encouraged to call with questions or concerns.  PMT will continue to support holistically.   Goals of Care: 1.  Code Status:Full code   2. Scope of Treatment:  Patient is open to all available and offered medical interventions to prolong quality life.  3. Disposition: Home with home health when medically stable.  Spoke with [name] his [facility] nurse, updated her and encouraged her  to call with questions or concerns     4. Symptom Management:   1.  Weakness: medcial management of chronic disease  5. Psychosocial:  Emotional support offered.  He expresses his full trust in his doctors and ultimate trust in Tyler Christensen.  He understands his medical conditions and remains hopeful for prolonged quality and quantity  life.   Brief HPI:  80 y.o. male w/ PMHx significant for NICM with EF of 20% on chronic milrinone, HTN, pulm HTN, chronic renal insufficiency who presented to [facility] on [facility]/01/2014 with complaints of abdominal pain. Spanish speaking only.  Admitted with worsening shortness of breath, increased abdominal distension, feeling cold, lightheadedness, palpitations, decreased appetite and decreased urine output.Denies any fever, chills (though feels cold at times).   Adjustments for medical management of chronic disease.  Recently diagnosed with retroperitoneal mass, when biopsied in May/2015 benign lesion now enlarged on recent CT.  Not a candidate for surgery or radiation   ROS: weakness, fatigue, intermittent abdominal pain   PMH:  Past Medical History  Diagnosis Date  . HTN (hypertension)   . High cholesterol   . CHF (congestive heart failure)   . BBBB (bilateral bundle branch block)   . LBBB (left bundle branch block)   . Weakness   . Tired   . Seizures 06/11/2012    new onset/notes (06/11/2012)  . SOB (shortness of breath)     "sometimes when I lay down;; related to not taking my RX" (06/11/2012)  . Myocardial infarction     06/10/2012  . Atrial fibrillation   .  Stroke   . Atrial thrombus     left  . Pulmonary HTN      PSH: Past Surgical History  Procedure Laterality Date  . Throat surgery  1942    "and nose" (06/11/2012); ?T&A  . Inguinal hernia repair  ~ 2008    "both sides" (06/11/2012)  . Tee without cardioversion  06/29/2012    Procedure: TRANSESOPHAGEAL ECHOCARDIOGRAM (TEE);  Surgeon: Jolaine Artist, MD;  Location: [facility]  ENDOSCOPY;  Service: Cardiovascular;  Laterality: N/A;  will need a spanish interpreter Interpreter will be here at 1330-Hope spoke w/ [place]  . Cardioversion N/A 08/14/2012    Procedure: CARDIOVERSION;  Surgeon: Jolaine Artist, MD;  Location: [facility] ENDOSCOPY;  Service: Cardiovascular;  Laterality: N/A;  . Tee without cardioversion N/A 11/12/2013    Procedure: TRANSESOPHAGEAL ECHOCARDIOGRAM (TEE);  Surgeon: Candee Furbish, MD;  Location: [facility] ENDOSCOPY;  Service: Cardiovascular;  Laterality: N/A;  . Tee without cardioversion N/A 11/19/2013    Procedure: TRANSESOPHAGEAL ECHOCARDIOGRAM (TEE);  Surgeon: Jolaine Artist, MD;  Location: [facility] ENDOSCOPY;  Service: Cardiovascular;  Laterality: N/A;  . Cardioversion N/A 12/27/2013    Procedure: CARDIOVERSION;  Surgeon: Jolaine Artist, MD;  Location: [facility] ENDOSCOPY;  Service: Cardiovascular;  Laterality: N/A;   I have reviewed the [place] and SH and  If appropriate update it with new information. Allergies  Allergen Reactions  . Lisinopril Rash   Scheduled Meds: . amiodarone  200 mg Oral BID  . atorvastatin  20 mg Oral q1800  . magnesium oxide  400 mg Oral Daily  . sildenafil  20 mg Oral TID  . sodium chloride  3 mL Intravenous Q12H  . warfarin  4 mg Oral ONCE-1800  . Warfarin - Pharmacist Dosing Inpatient   Does not apply q1800   Continuous Infusions: . milrinone 0.375 mcg/kg/min (03/05/14 1606)   PRN Meds:.sodium chloride, acetaminophen, ondansetron (ZOFRAN) IV, sodium chloride, traMADol    BP 104/40  Pulse 76  Temp(Src) 98.1 F (36.7 C) (Oral)  Resp 26  Ht 5\' 8"  (1.727 m)  Wt 72.8 kg (160 lb 7.9 oz)  BMI 24.41 kg/m2  SpO2 96%   PPS: 50 %   Intake/Output Summary (Last 24 hours) at 03/05/14 1658 Last data filed at 03/05/14 1600  Gross per 24 hour  Intake  796.8 ml  Output   1576 ml  Net -779.2 ml    Physical Exam:  General: chronically ill appearing NAD HEENT:  Moist buccal membranes, No exudate Chest:  CTA CVS: RRR    Abdomen:  distended, mildly tender on palpation Ext: without edema Neuro: alert and oriented X3  Labs: CBC    Component Value Date/Time   WBC 14.9* 03/05/2014 0700   RBC 3.92* 03/05/2014 0700   HGB 12.1* 03/05/2014 0700   HCT 36.5* 03/05/2014 0700   PLT 173 03/05/2014 0700   MCV 93.1 03/05/2014 0700   MCH 30.9 03/05/2014 0700   MCHC 33.2 03/05/2014 0700   RDW 14.7 03/05/2014 0700   LYMPHSABS 0.5* 11/07/2013 0708   MONOABS 0.2 11/07/2013 0708   EOSABS 0.0 11/07/2013 0708   BASOSABS 0.0 11/07/2013 0708    BMET    Component Value Date/Time   NA 137 03/05/2014 0700   K 3.7 03/05/2014 0700   CL 99 03/05/2014 0700   CO2 28 03/05/2014 0700   GLUCOSE 178* 03/05/2014 0700   BUN 34* 03/05/2014 0700   CREATININE 1.44* 03/05/2014 0700   CALCIUM 8.2* 03/05/2014 0700   GFRNONAA 45* 03/05/2014 0700  GFRAA 52* 03/05/2014 0700    CMP     Component Value Date/Time   NA 137 03/05/2014 0700   K 3.7 03/05/2014 0700   CL 99 03/05/2014 0700   CO2 28 03/05/2014 0700   GLUCOSE 178* 03/05/2014 0700   BUN 34* 03/05/2014 0700   CREATININE 1.44* 03/05/2014 0700   CALCIUM 8.2* 03/05/2014 0700   PROT 7.2 03/01/2014 1959   ALBUMIN 3.6 03/01/2014 1959   AST 43* 03/01/2014 1959   ALT 50 03/01/2014 1959   ALKPHOS 152* 03/01/2014 1959   BILITOT 0.4 03/01/2014 1959   GFRNONAA 45* 03/05/2014 0700   GFRAA 52* 03/05/2014 0700     Time In Time Out Total Time Spent with Patient Total Overall Time  1400 1515 70 min 75 min    Greater than 50%  of this time was spent counseling and coordinating care related to the above assessment and plan.   Wadie Lessen NP  Palliative Medicine Team Team Phone # [phone] Pager [phone]  Discussed with Dr Wonda Cerise

## 2014-03-05 NOTE — Progress Notes (Signed)
ANTICOAGULATION CONSULT NOTE - Follow Up Consult  Pharmacy Consult for Warfarin Indication: atrial fibrillation  Allergies  Allergen Reactions  . Lisinopril Rash    Patient Measurements: Height: 5\' 8"  (172.7 cm) Weight: 160 lb 7.9 oz (72.8 kg) IBW/kg (Calculated) : 68.4  Vital Signs: Temp: 98 F (36.7 C) (09/16 1200) Temp src: Oral (09/16 1200) BP: 104/40 mmHg (09/16 1200) Pulse Rate: 76 (09/16 0410)  Labs:  Recent Labs  03/03/14 0500 03/04/14 0420 03/05/14 0445 03/05/14 0700  HGB 12.2*  --   --  12.1*  HCT 35.0*  --   --  36.5*  PLT 166  --   --  173  LABPROT 28.6* 25.3* 27.4*  --   INR 2.69* 2.30* 2.55*  --   CREATININE 4.16* 2.53*  --   --     Estimated Creatinine Clearance: 22.9 ml/min (by C-G formula based on Cr of 2.53).   Medical History: Past Medical History  Diagnosis Date  . HTN (hypertension)   . High cholesterol   . CHF (congestive heart failure)   . BBBB (bilateral bundle branch block)   . LBBB (left bundle branch block)   . Weakness   . Tired   . Seizures 06/11/2012    new onset/notes (06/11/2012)  . SOB (shortness of breath)     "sometimes when I lay down;; related to not taking my RX" (06/11/2012)  . Myocardial infarction     06/10/2012  . Atrial fibrillation   . Stroke   . Atrial thrombus     left  . Pulmonary HTN     Medications:  Prescriptions prior to admission  Medication Sig Dispense Refill  . amiodarone (PACERONE) 200 MG tablet Take 1 tablet (200 mg total) by mouth 2 (two) times daily.  60 tablet  6  . Choline Fenofibrate 45 MG capsule Take 45 mg by mouth daily.      Marland Kitchen loratadine (CLARITIN) 10 MG tablet Take 10 mg by mouth daily.      Marland Kitchen losartan (COZAAR) 25 MG tablet Take 25 mg by mouth daily.      . magnesium oxide (MAG-OX) 400 MG tablet Take 400 mg by mouth daily.      . metolazone (ZAROXOLYN) 2.5 MG tablet Take 2.5 mg by mouth daily as needed (according to instructions).      . milrinone (PRIMACOR) 20 MG/100ML SOLN  infusion Inject 26.775 mcg/min into the vein continuous.  100 mL  6  . potassium chloride SA (K-DUR,KLOR-CON) 20 MEQ tablet Take 1 tablet (20 mEq total) by mouth 2 (two) times daily. TAKE EXTRA TAB WHEN YOU TAKE METOLAZONE  60 tablet  3  . simvastatin (ZOCOR) 40 MG tablet Take 1 tablet (40 mg total) by mouth every evening.  30 tablet  6  . spironolactone (ALDACTONE) 25 MG tablet Take 25 mg by mouth daily.      . Tadalafil, PAH, (ADCIRCA) 20 MG TABS Take 40 mg by mouth daily.      Marland Kitchen torsemide (DEMADEX) 20 MG tablet Take 40 mg by mouth every evening.       . traMADol (ULTRAM) 50 MG tablet Take 50 mg by mouth every 12 (twelve) hours as needed for moderate pain.      Marland Kitchen warfarin (COUMADIN) 4 MG tablet Take 4 mg by mouth daily.        Assessment: 80 yo M admitted on 03/01/2014 with n/v x 4 days and progressive SOB. Last OP notes indicate was to decrease torsemide but did  not do this.  Pharmacy consulted to resume warfarin for atrial fibrillation. INR elevated on admission, 2 doses held and restarted on 9/14. INR now therapeutic at 2.55.   Home dose: 4 mg daily  Goal of Therapy:  INR 2-3 Monitor platelets by anticoagulation protocol: Yes   Plan:  - Coumadin 4 mg PO x 1 tonight - Daily INR - Continue to monitor CBC and s/s bleeding  Thank you for allowing pharmacy to be a part of this patients care team.  Harolyn Rutherford, PharmD Clinical Pharmacist - Resident Pager: [phone] Pharmacy: [phone] 03/05/2014 3:18 PM

## 2014-03-05 NOTE — Progress Notes (Signed)
CARDIAC REHAB PHASE I   PRE:  Rate/Rhythm: 83 pacing    BP: sitting 98/43    SaO2: 91 RA  MODE:  Ambulation: 250 ft   POST:  Rate/Rhythm: 87 pacing    BP: sitting 98/32     SaO2: 91 RA  Pt able to get out of bed and walk with RW. C/o feeling weak in general. Slight SOB, SaO2 87 briefly then 90-91 during walk. To recliner after walk. Gave pt chocolate Ensure since he didn't have an appetite for his lunch. Will f/u. [phone]   Josephina Shih [place] CES, ACSM 03/05/2014 3:28 PM

## 2014-03-05 NOTE — Progress Notes (Signed)
CARE MANAGEMENT NOTE 03/05/2014  Patient:  Tyler Christensen, Tyler Christensen   Account Number:  [id number]  Date Initiated:  03/04/2014  Documentation initiated by:  Eshani Maestre  Subjective/Objective Assessment:   81 y.o. male w/ PMHx significant for NICM with EF of 20% on chronic milrinone, HTN, pulm HTN, chronic renal insufficiency  (baseline CR 1.8) admitted 9/12 with ab pain/distension and acute renal failure Cr 6.4     Action/Plan:   home on iv milrinone   Anticipated DC Date:  03/08/2014   Anticipated DC Plan:  [place]  In-house referral  NA      DC Planning Services  CM consult      PAC Choice  NA   Choice offered to / List presented to:  NA   DME arranged  IV PUMP/EQUIPMENT      DME agency  [place] arranged  HH-1 RN      [place].   Status of service:  In process, will continue to follow Medicare Important Message given?   (If response is "NO", the following Medicare IM given date fields will be blank) Date Medicare IM given:   Medicare IM given by:   Date Additional Medicare IM given:   Additional Medicare IM given by:    Discharge Disposition:    Per UR Regulation:  Reviewed for med. necessity/level of care/duration of stay  If discussed at Long Length of Stay Meetings, dates discussed:    Comments:  [id number]/Roi Jafari,RN,BSN,CCM: Found to have staph aureus bacteremia treated with IV abx. TEE negative for endocarditis. Also found to retroperitoneal mass and biopsy revealed benign lesion. He had evidence of low output thought to be due to atrial fibrillation and milrinone increased. Attempted TEE and DC-CV but had LAA clot so deferred cardioversion./[id number]-hypotensive/plan continues to send home on the iv milrinone through advanced home health care. Carolynn Sayers aware.   Stable overnight. Tadalafil has been on hold with CrCl < 30. [name] 90-120s. Remains on milrinone 0.375 and co-ox 90%. Weight up 2 lbs.  Pending renal function panel. Patient feels very weak and like he is going to fall when he gets up.     [id number]/OHYWVP Walker Sitar,RN,BSN,CCM

## 2014-03-05 NOTE — Progress Notes (Signed)
Interpreter Lesle Chris for RN [facility], palliative care. Dr Haroldine Laws and Deatra Canter

## 2014-03-05 NOTE — Discharge Summary (Signed)
Advanced Heart Failure Team  Discharge Summary   Patient ID: Tyler Christensen MRN: [id number], DOB/AGE: [birth year] 80 y.o. 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 Seda is a 80 y.o. male w/ PMHx significant for NICM with EF of 20% on chronic milrinone, HTN, pulm HTN, chronic renal insufficiency (baseline CR 1.8), retroperitoneal mass and PAF.  He was admitted 9/12 with ab pain/distension and acute renal failure. Pertinent labs on admission were pro-BNP 11880, Troponin 0.97, INR 3.62, K+ 3.9 and creatinine of 6.58. His diuretics were placed on hold on admission and we set up CVPs and drew a co-ox to assess hemodynamics. There was concern that he was suffering from low-out and volume overload, however initial co-ox was 80% on his milrinone 0.375 mcg and CVP was 7. His diuretics remained on hold and his renal function started trending down. A renal US was obtained showing R renal 12.8 cm with cyct 2.9 x 3.7 x 3.1 upper pole and L renal 11.8 with cyst mid kidney 4.7 x 4.5 x 5.1. His taldalfil was placed on hold as well since it is not indicated in patients with CrCl < 30 and he was started on revatio 20 mg TID which a prior-authorization was placed and granted. He continued to have some weakness and dizziness and CVP remained low in the 4-5's and he received a 250 ml bolus of NS and he felt much better. His diuretics were restarted at a lower dose torsemide 20 mg daily, however renal function bumped and CVP dropped so we will hold for one day and then restart on OP side torsemide 20 mg daily following renal function closely.   Dr. Haroldine Laws reviewed with the radiologists the patient's  previous CT results of his enlarging mass and even though he had a negative biopsy they both felt strongly that it was a liposarcoma. Discussions took place about possible radiation, however that was not recommended and concerns were that it could make it worse. He is not a candidate for chemo or surgery d/t his HF so Palliative Care was consulted to discuss Goals of Care if the patient started declining. He did have a lot of stool in his abdomen and we gave him sorbitol and started him on a bowel regimen for home.   On the day of discharge the patient's VSS and he denied any CP, orthopnea or SOB. He will be discharged home with HHRN/PT and have close follow up in the HF clinic next week. He will go home on lower dose of diuretics which an interpreter discussed with him and we also discussed stopping taldalafil and starting revatio 20 mg TID.   Discharge Weight Range:  160-165 lbs. Discharge Vitals: Blood pressure 105/56, pulse 77, temperature 98 F (36.7 C), temperature source Oral, resp. rate 30, height 5\' 8"  (1.727 m), weight 160 lb 11.5 oz (72.9 kg), SpO2 92.00%.  Labs: Lab Results  Component Value Date   WBC 11.3* 03/07/2014   HGB 11.5* 03/07/2014   HCT 34.7* 03/07/2014   MCV 93.8 03/07/2014   PLT 196 03/07/2014    Recent Labs Lab 03/01/14 1959  03/07/14 0352  NA 135*  < > 140  K 3.9  < >  3.9  CL 93*  < > 98  CO2 21  < > 29  BUN 108*  < > 30*  CREATININE 6.58*  < > 1.80*  CALCIUM 8.8  < > 8.2*  PROT 7.2  --   --   BILITOT 0.4  --   --   ALKPHOS 152*  --   --   ALT 50  --   --   AST 43*  --   --   GLUCOSE 128*  < > 159*  < > = values in this interval not displayed. Lab Results  Component Value Date   CHOL 128 06/12/2012   HDL 27* 06/12/2012   LDLCALC 78 06/12/2012   TRIG 113 06/12/2012   BNP (last 3 results)  Recent Labs  11/07/13 0708 03/01/14 1959  PROBNP 9356.0* 11880.0*    Diagnostic Studies/Procedures   Dg Chest 2 View  03/06/2014   CLINICAL DATA:  Accidental  right PICC line removal by the patient  EXAM: CHEST  2 VIEW  COMPARISON:  Portable chest x-ray of March 01, 2014  FINDINGS: The right-sided PICC line is no longer evident. There is no pneumothorax or pleural effusion. The pulmonary interstitial markings are mildly prominent but stable. There is stable atelectasis in the right middle lobe medially. The cardiac silhouette is top-normal in size. The pulmonary vascularity is prominent centrally. The permanent pacemaker-defibrillator is unchanged in position. The bony thorax is unremarkable.  IMPRESSION: 1. There is no evidence of a post PICC line removal complication. 2. Low-grade compensated CHF with mild interstitial edema.   Electronically Signed   By: David  Martinique   On: 03/06/2014 16:58   Dg Abd 1 View  03/06/2014   CLINICAL DATA:  Abdominal pain and distention.  EXAM: ABDOMEN - 1 VIEW  COMPARISON:  06/22/2012  FINDINGS: Air-filled loops of small enlarged bowel are noted. There are no dilated loops of small bowel to suggest obstruction. No abnormal abdominal or pelvic calcifications noted.  IMPRESSION: 1. Nonobstructive bowel gas pattern.   Electronically Signed   By: Kerby Moors M.D.   On: 03/06/2014 10:10    Discharge Medications     Medication List    STOP taking these medications       ADCIRCA 20 MG Tabs  Generic drug:  Tadalafil (PAH)     Choline Fenofibrate 45 MG capsule     losartan 25 MG tablet  Commonly known as:  COZAAR     metolazone 2.5 MG tablet  Commonly known as:  ZAROXOLYN     simvastatin 40 MG tablet  Commonly known as:  ZOCOR     spironolactone 25 MG tablet  Commonly known as:  ALDACTONE      TAKE these medications       amiodarone 200 MG tablet  Commonly known as:  PACERONE  Take 1 tablet (200 mg total) by mouth 2 (two) times daily.     atorvastatin 20 MG tablet  Commonly known as:  LIPITOR  Take 1 tablet (20 mg total) by mouth daily at 6 PM.     loratadine 10 MG tablet  Commonly known as:   CLARITIN  Take 10 mg by mouth daily.     magnesium oxide 400 MG tablet  Commonly known as:  MAG-OX  Take 400 mg by mouth daily.     milrinone 20 MG/100ML Soln infusion  Commonly known as:  PRIMACOR  Inject 26.775 mcg/min into the vein continuous.     potassium chloride SA 20 MEQ  tablet  Commonly known as:  K-DUR,KLOR-CON  Take 1 tablet (20 mEq total) by mouth daily.     senna-docusate 8.6-50 MG per tablet  Commonly known as:  Senokot-S  Take 1 tablet by mouth at bedtime.     sildenafil 20 MG tablet  Commonly known as:  REVATIO  Take 1 tablet (20 mg total) by mouth 3 (three) times daily.     torsemide 20 MG tablet  Commonly known as:  DEMADEX  Take 1 tablet (20 mg total) by mouth daily.     traMADol 50 MG tablet  Commonly known as:  ULTRAM  Take 50 mg by mouth every 12 (twelve) hours as needed for moderate pain.     warfarin 4 MG tablet  Commonly known as:  COUMADIN  Take 4 mg daily except Friday and Monday take 2 mg (1/2 tablet)        Disposition   The patient will be discharged in stable condition to home. Discharge Instructions   Contraindication beta blocker-discharge    Complete by:  As directed      Contraindication to ACEI at discharge    Complete by:  As directed      Diet - low sodium heart healthy    Complete by:  As directed      Heart Failure patients record your daily weight using the same scale at the same time of day    Complete by:  As directed      Increase activity slowly    Complete by:  As directed      STOP any activity that causes chest pain, shortness of breath, dizziness, sweating, or exessive weakness    Complete by:  As directed           Follow-up Information   Follow up with [facility] On 03/13/2014. (@ 1;45 pm; Gate Code 0050; Please Bring all medications to your visit)    Specialty:  Cardiology   Contact information:   [address] [id number] [place]   [address] [phone]        Duration of Discharge Encounter: Greater than 35 minutes   Signed, Rande Brunt  03/07/2014, 8:34 AM  Patient seen and examined with Junie Bame, NP. We discussed all aspects of the encounter. I agree with the assessment and plan as stated above. [place] for d/c home today with close f/u in HF Clinic.   Benay Spice 3:48 PM

## 2014-03-06 ENCOUNTER — Inpatient Hospital Stay (HOSPITAL_COMMUNITY): Payer: Medicare Other

## 2014-03-06 LAB — CARBOXYHEMOGLOBIN
CARBOXYHEMOGLOBIN: 1.2 % (ref 0.5–1.5)
CARBOXYHEMOGLOBIN: 1.5 % (ref 0.5–1.5)
METHEMOGLOBIN: 0.5 % (ref 0.0–1.5)
METHEMOGLOBIN: 0.7 % (ref 0.0–1.5)
O2 Saturation: 53.8 %
O2 Saturation: 63.9 %
TOTAL HEMOGLOBIN: 17.1 g/dL (ref 13.5–18.0)
Total hemoglobin: 12.1 g/dL — ABNORMAL LOW (ref 13.5–18.0)

## 2014-03-06 LAB — CBC
HEMATOCRIT: 35.1 % — AB (ref 39.0–52.0)
Hemoglobin: 11.7 g/dL — ABNORMAL LOW (ref 13.0–17.0)
MCH: 31.1 pg (ref 26.0–34.0)
MCHC: 33.3 g/dL (ref 30.0–36.0)
MCV: 93.4 fL (ref 78.0–100.0)
Platelets: 189 10*3/uL (ref 150–400)
RBC: 3.76 MIL/uL — AB (ref 4.22–5.81)
RDW: 14.8 % (ref 11.5–15.5)
WBC: 13.8 10*3/uL — AB (ref 4.0–10.5)

## 2014-03-06 LAB — BASIC METABOLIC PANEL
Anion gap: 13 (ref 5–15)
BUN: 29 mg/dL — ABNORMAL HIGH (ref 6–23)
CO2: 28 meq/L (ref 19–32)
Calcium: 8.6 mg/dL (ref 8.4–10.5)
Chloride: 97 mEq/L (ref 96–112)
Creatinine, Ser: 1.46 mg/dL — ABNORMAL HIGH (ref 0.50–1.35)
GFR calc Af Amer: 51 mL/min — ABNORMAL LOW (ref >90)
GFR, EST NON AFRICAN AMERICAN: 44 mL/min — AB (ref >90)
Glucose, Bld: 139 mg/dL — ABNORMAL HIGH (ref 70–99)
POTASSIUM: 4.3 meq/L (ref 3.7–5.3)
SODIUM: 138 meq/L (ref 137–147)

## 2014-03-06 LAB — PROTIME-INR
INR: 2.97 — ABNORMAL HIGH (ref 0.00–1.49)
Prothrombin Time: 30.9 seconds — ABNORMAL HIGH (ref 11.6–15.2)

## 2014-03-06 MED ORDER — TORSEMIDE 20 MG PO TABS
20.0000 mg | ORAL_TABLET | Freq: Every day | ORAL | Status: DC
  Administered 2014-03-06: 20 mg via ORAL
  Filled 2014-03-06 (×2): qty 1

## 2014-03-06 MED ORDER — SODIUM CHLORIDE 0.9 % IJ SOLN
10.0000 mL | Freq: Two times a day (BID) | INTRAMUSCULAR | Status: DC
  Administered 2014-03-06 – 2014-03-07 (×2): 10 mL

## 2014-03-06 MED ORDER — WARFARIN SODIUM 2 MG PO TABS
2.0000 mg | ORAL_TABLET | Freq: Once | ORAL | Status: AC
  Administered 2014-03-06: 2 mg via ORAL
  Filled 2014-03-06: qty 1

## 2014-03-06 MED ORDER — SODIUM CHLORIDE 0.9 % IJ SOLN: 10.0000 mL | INTRAMUSCULAR | Status: DC | PRN

## 2014-03-06 MED ORDER — SORBITOL 70 % SOLN
30.0000 mL | Freq: Once | Status: AC
  Administered 2014-03-06: 30 mL via ORAL
  Filled 2014-03-06: qty 30

## 2014-03-06 NOTE — Evaluation (Signed)
Physical Therapy Evaluation Patient Details Name: Alcario Tinkey MRN: [id number] DOB: 03/12/1933 Today's Date: 03/06/2014   History of Present Illness  Adm 03/01/14 with hypotension and abd pain (CT showed Rt retroperitoneal mass-?liposarcoma). PMHx- bil HF with EF 20% and home milrinone, pacemaker, HTN, afib with Lt atrial thrombus, seizures, CVA, renal insufficiency (non-HD candidate)  Clinical Impression  Patient evaluated by Physical Therapy with no further acute PT needs identified. All education has been completed and the patient has no further questions. Pt denies balance problems (except when he becomes dizzy, and then he has family present to assist him). PT is signing off. Thank you for this referral.     Follow Up Recommendations No PT follow up    Equipment Recommendations  None recommended by PT    Recommendations for Other Services       Precautions / Restrictions Precautions Precautions: Other (comment) Precaution Comments: mulitple lines/ICU      Mobility  Bed Mobility Overal bed mobility: Modified Independent Bed Mobility: Sit to Supine       Sit to supine: Modified independent (Device/Increase time)   General bed mobility comments: supervision for lines/monitors only  Transfers Overall transfer level: Modified independent Equipment used: None             General transfer comment: off toilet, bed without difficulty;   Ambulation/Gait Ambulation/Gait assistance: Supervision Ambulation Distance (Feet): 400 Feet Assistive device: Rolling walker (2 wheeled) Gait Pattern/deviations: Step-through pattern;Decreased stride length;Trunk flexed Gait velocity: decr Gait velocity interpretation: Below normal speed for age/gender General Gait Details: limits his velocity due to respiratory status (he did not become signficantly dyspneic due to his own self-monitoring, taking 3 standing rest breaks)  Stairs            Wheelchair Mobility    Modified  Rankin (Stroke Patients Only)       Balance Overall balance assessment: No apparent balance deficits (not formally assessed) (denies h/o falls; preferred to walk with RW due to sl dizzy)                                           Pertinent Vitals/Pain HR 88-100 with activity BP sitting pre- ambulation 133/58       Sitting post- amb      110/53  Pain Assessment: 0-10 Pain Score: 5  Pain Location: abd Pain Intervention(s): Monitored during session;Repositioned    Home Living Family/patient expects to be discharged to:: Private residence Living Arrangements: Spouse/significant other;Other relatives Available Help at Discharge: Family;Available 24 hours/day Type of Home: House Home Access: Level entry (per pt)     Home Layout: One level Home Equipment: None      Prior Function Level of Independence: Independent         Comments: denies h/o balance problems (other than related to dizziness; hypotension?); denies falls     Hand Dominance        Extremity/Trunk Assessment   Upper Extremity Assessment: Overall WFL for tasks assessed           Lower Extremity Assessment: Overall WFL for tasks assessed      Cervical / Trunk Assessment: Kyphotic  Communication   Communication: Prefers language other than English (interview/session conducted in [place])  Cognition Arousal/Alertness: Awake/alert Behavior During Therapy: WFL for tasks assessed/performed Overall Cognitive Status: Within Functional Limits for tasks assessed  General Comments General comments (skin integrity, edema, etc.): Refused to sit up in chair due to abd discomfort with sitting and prefers semi-reclined in bed. Denies need for equipment at home as he has family to assist him if needed.     Exercises        Assessment/Plan    PT Assessment Patent does not need any further PT services  PT Diagnosis     PT Problem List    PT Treatment  Interventions     PT Goals (Current goals can be found in the Care Plan section) Acute Rehab PT Goals PT Goal Formulation: No goals set, d/c therapy    Frequency     Barriers to discharge        Co-evaluation               End of Session Equipment Utilized During Treatment: Gait belt Activity Tolerance: Patient limited by fatigue Patient left: in bed;with call bell/phone within reach Nurse Communication: Mobility status         Time: 8295-6213 PT Time Calculation (min): 31 min   Charges:   PT Evaluation $Initial PT Evaluation Tier I: 1 Procedure PT Treatments $Gait Training: 8-22 mins   PT G Codes:          Finn Altemose 2014/03/08, 11:52 AM Pager [phone]

## 2014-03-06 NOTE — Progress Notes (Signed)
Pt D/C PICC line accidentally when going to the bathroom. PICC line at 36 cm. Per insertion note on 12/2012 PICC line was inserted to 46 cm. Dr. Haroldine Laws paged. New orders received. Will continue to monitor.

## 2014-03-06 NOTE — Progress Notes (Signed)
Advanced Heart Failure Rounding Note   Subjective:    80 y.o. male w/ PMHx significant for NICM with EF of 20% on chronic milrinone, HTN, pulm HTN, chronic renal insufficiency  (baseline CR 1.8) admitted 9/12 with ab pain/distension and acute renal failure Cr 6.4  Admitted to the hospital 5/21-11/22/13. Found to have staph aureus bacteremia treated with IV abx. TEE negative for endocarditis. Also found to retroperitoneal mass and biopsy revealed benign lesion. He had evidence of low output thought to be due to atrial fibrillation and milrinone increased. Attempted TEE and DC-CV but had LAA clot so deferred cardioversion.  Stable overnight. Received 250 cc bolus of NS for low CVP. Weight stable 161 and has not been on any diuretics. [name] 90-115s. Remains on milrinone 0.375 and co-ox 64%. Renal function much improved 1.46. Palliative Care met with patient yesterday for Goals of Care.   Objective:   Weight Range:  Vital Signs:   Temp:  [98 F (36.7 C)-98.5 F (36.9 C)] 98.2 F (36.8 C) (09/17 0547) Resp:  [15-26] 25 (09/17 0600) BP: (89-114)/(35-56) 114/56 mmHg (09/17 0600) SpO2:  [93 %-98 %] 93 % (09/17 0400) Weight:  [161 lb 3.2 oz (73.12 kg)] 161 lb 3.2 oz (73.12 kg) (09/17 0547) Last BM Date: 03/04/14  Weight change: Filed Weights   03/04/14 0445 03/05/14 0410 03/06/14 0547  Weight: 158 lb 14.4 oz (72.077 kg) 160 lb 7.9 oz (72.8 kg) 161 lb 3.2 oz (73.12 kg)    Intake/Output:   Intake/Output Summary (Last 24 hours) at 03/06/14 0706 Last data filed at 03/06/14 0600  Gross per 24 hour  Intake  512.2 ml  Output   1075 ml  Net -562.8 ml     Physical Exam: CVP 6 General:  Elderly man. Lying in bed. No resp difficulty HEENT: normal Neck: supple. JVP flat . Carotids 2+ bilat; no bruits. No lymphadenopathy or thryomegaly appreciated. Cor: PMI laterally displaced. Distant regular.  Lungs: clear Abdomen: soft, nontender, markedly distended. Good bowel sounds, hyperactive  BS Extremities: no cyanosis, clubbing, rash, edema Neuro: alert & orientedx3, cranial nerves grossly intact. moves all 4 extremities w/o difficulty. Affect pleasant  Telemetry: SR 80s  Labs: Basic Metabolic Panel:  Recent Labs Lab 03/02/14 0727 03/03/14 0500 03/04/14 0420 03/05/14 0700 03/06/14 0440  NA 133* 134* 138 137 138  K 4.0 3.5* 3.4* 3.7 4.3  CL 92* 93* 98 99 97  CO2 $Re'23 26 28 28 28  'ZBf$ GLUCOSE 161* 142* 104* 178* 139*  BUN 109* 97* 70* 34* 29*  CREATININE 6.40* 4.16* 2.53* 1.44* 1.46*  CALCIUM 8.4 8.7 8.6 8.2* 8.6    Liver Function Tests:  Recent Labs Lab 03/01/14 1959  AST 43*  ALT 50  ALKPHOS 152*  BILITOT 0.4  PROT 7.2  ALBUMIN 3.6   No results found for this basename: LIPASE, AMYLASE,  in the last 168 hours No results found for this basename: AMMONIA,  in the last 168 hours  CBC:  Recent Labs Lab 03/01/14 1959 03/03/14 0500 03/05/14 0700  WBC 11.9* 15.5* 14.9*  HGB 12.8* 12.2* 12.1*  HCT 36.8* 35.0* 36.5*  MCV 90.6 91.1 93.1  PLT 184 166 173    Cardiac Enzymes:  Recent Labs Lab 03/01/14 1959  TROPONINI 0.97*    BNP: BNP (last 3 results)  Recent Labs  11/07/13 0708 03/01/14 1959  PROBNP 9356.0* 11880.0*     Other results:  EKG:   Imaging: No results found.   Medications:     Scheduled Medications: .  amiodarone  200 mg Oral BID  . atorvastatin  20 mg Oral q1800  . magnesium oxide  400 mg Oral Daily  . sildenafil  20 mg Oral TID  . sodium chloride  3 mL Intravenous Q12H  . Warfarin - Pharmacist Dosing Inpatient   Does not apply q1800    Infusions: . milrinone 0.375 mcg/kg/min (03/06/14 0328)    PRN Medications: sodium chloride, acetaminophen, ondansetron (ZOFRAN) IV, sodium chloride, traMADol   Assessment:   1. Acute on chronic renal failure, stage V 2. Advanced chronic systolic HF d/t NICM EF 09% 3. Pulmonary HTN 4. PAF now in NSR on coumadin 5. Abdominal pain and distension  6. Hypotension 7.  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 Diuretics have been on hold and he continues to not be volume overloaded. Yesterday received 250 cc NS. CVP 6 this am and weight stable. Will continue to hold diuretics and will likely need to adjust home diuretics to lower dose, likely will need torsemide 20 mg daily. Co-ox 64% on milrinone 0.375 mcg. Will get [employer] representative to interrogate Opt[employer]ol to see if accurate compared to CVP.   Patient was switched from tadalafil to revatio 20 mg TID yesterday d/t CrCl < 30 and it fluctuates quit a bit at home. Renal function much better. Will continue revatio 20 mg TID for now which has been approved.   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 Unfortunately he is nota candidate for chemo or surgery with his severe HF. Dr. Haroldine Laws had lengthy discussion with help of interpreter about prognosis and Palliative Care came by to discuss goals of care with patient yesterday.  Walked with CR yesterday and had minimal SOB. Feels much stronger and feels like he could go home today.   Length of Stay: 5 Rande Brunt NP-C 03/06/2014, 7:06 AM  Advanced Heart Failure Team Pager [phone] (M-F; 7a - 4p)  Please contact [facility] for night-coverage after hours (4p -7a ) and weekends on [email or web address]  Patient seen and examined with Junie Bame, NP. We discussed all aspects of the encounter. I agree with the assessment and plan as stated above.   Improving. Renal function coming back to baseline. OK to give a little fluid back today with low CVP. Long talk about his retroperitoneal mass (via interpreter). He is not candidate for chemo or resection so will not repeat biopsy. Discussed with Rad-Onc and XRT would  not be effective. We have engaged Palliative Care to have them on board when needed.   Looks to have constipation on CT. Needs good bowel regimen.   Hopefully home in am.   Junie Bame B,MD 7:06 AM  Patient seen and examined with Junie Bame, NP. We discussed all aspects of the encounter. I agree with the assessment and plan as stated above.   Renal function much improved. CVP ~10 with v waves to 20. Will restart demadex at 20 daily. Will get KUB to look at stool burden. I think he needs better bowel regimen to help with ab distension which may not be clearly related to volume overload. Give dose of sorbitol now.   Daniel Bensimhon,MD 9:02 AM

## 2014-03-06 NOTE — Progress Notes (Signed)
Peripherally Inserted Central Catheter/Midline Placement  The IV Nurse has discussed with the patient and/or persons authorized to consent for the patient, the purpose of this procedure and the potential benefits and risks involved with this procedure.  The benefits include less needle sticks, lab draws from the catheter and patient may be discharged home with the catheter.  Risks include, but not limited to, infection, bleeding, blood clot (thrombus formation), and puncture of an artery; nerve damage and irregular heat beat.  Alternatives to this procedure were also discussed.  [place] [phone] used for translation.  PICC/Midline Placement Documentation  PICC / Midline Double Lumen 11/13/13 PICC Right Brachial 36 cm 0 cm (Active)     PICC / Midline Double Lumen 03/06/14 PICC Right Cephalic 34 cm 0 cm (Active)       Tyler Christensen, Nicolette Bang 03/06/2014, 6:35 PM

## 2014-03-06 NOTE — Progress Notes (Signed)
ANTICOAGULATION CONSULT NOTE - Follow Up Consult  Pharmacy Consult for Warfarin Indication: atrial fibrillation  Allergies  Allergen Reactions  . Lisinopril Rash    Patient Measurements: Height: 5\' 8"  (172.7 cm) Weight: 161 lb 3.2 oz (73.12 kg) IBW/kg (Calculated) : 68.4  Vital Signs: Temp: 99.2 F (37.3 C) (09/17 0745) Temp src: Oral (09/17 0745) BP: 95/33 mmHg (09/17 0800)  Labs:  Recent Labs  03/04/14 0420 03/05/14 0445 03/05/14 0700 03/06/14 0440  HGB  --   --  12.1* 11.7*  HCT  --   --  36.5* 35.1*  PLT  --   --  173 189  LABPROT 25.3* 27.4*  --  30.9*  INR 2.30* 2.55*  --  2.97*  CREATININE 2.53*  --  1.44* 1.46*    Estimated Creatinine Clearance: 39.7 ml/min (by C-G formula based on Cr of 1.46).   Medical History: Past Medical History  Diagnosis Date  . HTN (hypertension)   . High cholesterol   . CHF (congestive heart failure)   . BBBB (bilateral bundle branch block)   . LBBB (left bundle branch block)   . Weakness   . Tired   . Seizures 06/11/2012    new onset/notes (06/11/2012)  . SOB (shortness of breath)     "sometimes when I lay down;; related to not taking my RX" (06/11/2012)  . Myocardial infarction     06/10/2012  . Atrial fibrillation   . Stroke   . Atrial thrombus     left  . Pulmonary HTN     Medications:  Prescriptions prior to admission  Medication Sig Dispense Refill  . amiodarone (PACERONE) 200 MG tablet Take 1 tablet (200 mg total) by mouth 2 (two) times daily.  60 tablet  6  . Choline Fenofibrate 45 MG capsule Take 45 mg by mouth daily.      Marland Kitchen loratadine (CLARITIN) 10 MG tablet Take 10 mg by mouth daily.      Marland Kitchen losartan (COZAAR) 25 MG tablet Take 25 mg by mouth daily.      . magnesium oxide (MAG-OX) 400 MG tablet Take 400 mg by mouth daily.      . metolazone (ZAROXOLYN) 2.5 MG tablet Take 2.5 mg by mouth daily as needed (according to instructions).      . milrinone (PRIMACOR) 20 MG/100ML SOLN infusion Inject 26.775  mcg/min into the vein continuous.  100 mL  6  . potassium chloride SA (K-DUR,KLOR-CON) 20 MEQ tablet Take 1 tablet (20 mEq total) by mouth 2 (two) times daily. TAKE EXTRA TAB WHEN YOU TAKE METOLAZONE  60 tablet  3  . simvastatin (ZOCOR) 40 MG tablet Take 1 tablet (40 mg total) by mouth every evening.  30 tablet  6  . spironolactone (ALDACTONE) 25 MG tablet Take 25 mg by mouth daily.      . Tadalafil, PAH, (ADCIRCA) 20 MG TABS Take 40 mg by mouth daily.      Marland Kitchen torsemide (DEMADEX) 20 MG tablet Take 40 mg by mouth every evening.       . traMADol (ULTRAM) 50 MG tablet Take 50 mg by mouth every 12 (twelve) hours as needed for moderate pain.      Marland Kitchen warfarin (COUMADIN) 4 MG tablet Take 4 mg by mouth daily.        Assessment: 80 yo M admitted on 03/01/2014 with n/v x 4 days and progressive SOB. Last OP notes indicate was to decrease torsemide but did not do this.  Pharmacy consulted to  resume warfarin for atrial fibrillation. INR elevated on admission, 2 doses held and restarted on 9/14. INR now therapeutic but continues to trend up to 2.97.   Home dose: 4 mg daily  Goal of Therapy:  INR 2-3 Monitor platelets by anticoagulation protocol: Yes   Plan:  - Coumadin 2 mg PO x 1 tonight - Daily INR - Continue to monitor CBC and s/s bleeding  Thank you for allowing pharmacy to be a part of this patients care team.  Harolyn Rutherford, PharmD Clinical Pharmacist - Resident Pager: [phone] Pharmacy: [phone] 03/06/2014 9:05 AM

## 2014-03-06 NOTE — Progress Notes (Signed)
Pt declined. Just pulled out his PICC and sts his heart needs some rest. Willing to walk later with nursing. I will f/u tomorrow. Yves Dill CES, ACSM 3:04 PM 03/06/2014

## 2014-03-06 NOTE — Progress Notes (Signed)
CARE MANAGEMENT NOTE 03/06/2014  Patient:  Tyler Christensen, Tyler Christensen   Account Number:  [id number]  Date Initiated:  03/04/2014  Documentation initiated by:  DAVIS,RHONDA  Subjective/Objective Assessment:   80 y.o. male w/ PMHx significant for NICM with EF of 20% on chronic milrinone, HTN, pulm HTN, chronic renal insufficiency  (baseline CR 1.8) admitted 9/12 with ab pain/distension and acute renal failure Cr 6.4     Action/Plan:   home on iv milrinone   Anticipated DC Date:  03/08/2014   Anticipated DC Plan:  [place]  In-house referral  Hospice / [facility]  CM consult      [facility]  NA   Choice offered to / List presented to:  NA   DME arranged  IV PUMP/EQUIPMENT      DME agency  [place] arranged  HH-1 RN      [place].   Status of service:  In process, will continue to follow Medicare Important Message given?   (If response is "NO", the following Medicare IM given date fields will be blank) Date Medicare IM given:   Medicare IM given by:   Date Additional Medicare IM given:   Additional Medicare IM given by:    Discharge Disposition:    Per UR Regulation:  Reviewed for med. necessity/level of care/duration of stay  If discussed at [place] of Stay Meetings, dates discussed:    Comments:  [id number]/Rhonda Davis,RN,BSN,CCM: palliative care met with patient for goals of care-no notes as to meeting or decision at time of review/spanish int. was used.Nelda Bucks hypotensive-89/33, Iv milrinone ongoing plan for home med as below.  Co-Ox 12.1 today. bun-29,creat-1.46/wbc-13.8 but afebrile/will continue to follow for needs and discharge plans.  [id number]/ZYSAYT DAvis,RN,BSN,CCM: Found to have staph aureus bacteremia treated with IV abx. TEE negative for endocarditis. Also found to retroperitoneal mass and biopsy revealed benign lesion. He had evidence of low  output thought to be due to atrial fibrillation and milrinone increased. Attempted TEE and DC-CV but had LAA clot so deferred cardioversion./[id number]-hypotensive/plan continues to send home on the iv milrinone through advanced home health care. Carolynn Sayers aware.   Stable overnight. Tadalafil has been on hold with CrCl < 30. [name] 90-120s. Remains on milrinone 0.375 and co-ox 90%. Weight up 2 lbs. Pending renal function panel. Patient feels very weak and like he is going to fall when he gets up.     [id number]/ATFTDD Davis,RN,BSN,CCM

## 2014-03-07 DIAGNOSIS — I5023 Acute on chronic systolic (congestive) heart failure: Secondary | ICD-10-CM

## 2014-03-07 LAB — BASIC METABOLIC PANEL
Anion gap: 13 (ref 5–15)
BUN: 30 mg/dL — ABNORMAL HIGH (ref 6–23)
CALCIUM: 8.2 mg/dL — AB (ref 8.4–10.5)
CO2: 29 mEq/L (ref 19–32)
Chloride: 98 mEq/L (ref 96–112)
Creatinine, Ser: 1.8 mg/dL — ABNORMAL HIGH (ref 0.50–1.35)
GFR calc Af Amer: 40 mL/min — ABNORMAL LOW (ref >90)
GFR calc non Af Amer: 34 mL/min — ABNORMAL LOW (ref >90)
GLUCOSE: 159 mg/dL — AB (ref 70–99)
Potassium: 3.9 mEq/L (ref 3.7–5.3)
Sodium: 140 mEq/L (ref 137–147)

## 2014-03-07 LAB — PROTIME-INR
INR: 3.16 — ABNORMAL HIGH (ref 0.00–1.49)
Prothrombin Time: 32.4 seconds — ABNORMAL HIGH (ref 11.6–15.2)

## 2014-03-07 LAB — CBC
HCT: 34.7 % — ABNORMAL LOW (ref 39.0–52.0)
Hemoglobin: 11.5 g/dL — ABNORMAL LOW (ref 13.0–17.0)
MCH: 31.1 pg (ref 26.0–34.0)
MCHC: 33.1 g/dL (ref 30.0–36.0)
MCV: 93.8 fL (ref 78.0–100.0)
Platelets: 196 10*3/uL (ref 150–400)
RBC: 3.7 MIL/uL — ABNORMAL LOW (ref 4.22–5.81)
RDW: 15 % (ref 11.5–15.5)
WBC: 11.3 10*3/uL — ABNORMAL HIGH (ref 4.0–10.5)

## 2014-03-07 LAB — CARBOXYHEMOGLOBIN
CARBOXYHEMOGLOBIN: 1.5 % (ref 0.5–1.5)
METHEMOGLOBIN: 0.7 % (ref 0.0–1.5)
O2 Saturation: 70.5 %
Total hemoglobin: 11.8 g/dL — ABNORMAL LOW (ref 13.5–18.0)

## 2014-03-07 MED ORDER — SENNOSIDES-DOCUSATE SODIUM 8.6-50 MG PO TABS: 1.0000 | ORAL_TABLET | Freq: Every day | ORAL | Status: DC

## 2014-03-07 MED ORDER — WARFARIN SODIUM 4 MG PO TABS: ORAL_TABLET | ORAL | Status: DC

## 2014-03-07 MED ORDER — POTASSIUM CHLORIDE CRYS ER 20 MEQ PO TBCR: 20.0000 meq | EXTENDED_RELEASE_TABLET | Freq: Every day | ORAL | Status: DC

## 2014-03-07 MED ORDER — ATORVASTATIN CALCIUM 20 MG PO TABS: 20.0000 mg | ORAL_TABLET | Freq: Every day | ORAL | Status: DC

## 2014-03-07 MED ORDER — OXYCODONE HCL 10 MG PO TABS: 10.0000 mg | ORAL_TABLET | ORAL | Status: DC | PRN

## 2014-03-07 MED ORDER — OXYCODONE HCL 5 MG PO TABS
10.0000 mg | ORAL_TABLET | ORAL | Status: DC | PRN
  Filled 2014-03-07 (×4): qty 2

## 2014-03-07 MED ORDER — SILDENAFIL CITRATE 20 MG PO TABS: 20.0000 mg | ORAL_TABLET | Freq: Three times a day (TID) | ORAL | Status: DC

## 2014-03-07 MED ORDER — WARFARIN SODIUM 2 MG PO TABS
2.0000 mg | ORAL_TABLET | Freq: Once | ORAL | Status: AC
  Administered 2014-03-07: 2 mg via ORAL
  Filled 2014-03-07: qty 1

## 2014-03-07 MED ORDER — TORSEMIDE 20 MG PO TABS: 20.0000 mg | ORAL_TABLET | Freq: Every day | ORAL | Status: DC

## 2014-03-07 NOTE — Progress Notes (Signed)
Home milrinone infusion began by home health care provider in pt's room; Hospital IVF/milrinone DC'd per protocol; pt preparing for discharge; awaiting ride home-sons expected arrival 1400; will continue to monitor

## 2014-03-07 NOTE — Progress Notes (Signed)
Advanced Heart Failure Rounding Note   Subjective:    80 y.o. male w/ PMHx significant for NICM with EF of 20% on chronic milrinone, HTN, pulm HTN, chronic renal insufficiency  (baseline CR 1.8) admitted 9/12 with ab pain/distension and acute renal failure Cr 6.4  Admitted to the hospital 5/21-11/22/13. Found to have staph aureus bacteremia treated with IV abx. TEE negative for endocarditis. Also found to retroperitoneal mass and biopsy revealed benign lesion. He had evidence of low output thought to be due to atrial fibrillation and milrinone increased. Attempted TEE and DC-CV but had LAA clot so deferred cardioversion.  PO diuretics restarted yesterday torsemide 20 mg daily and 24 hr I/O -500 cc and weight stable. Renal function up. PICC got pulled out yesterday and they replaced. CVP 2-3 Remains on milrinone 0.375 mcg.   Objective:   Weight Range:  Vital Signs:   Temp:  [97.3 F (36.3 C)-99.2 F (37.3 C)] 97.3 F (36.3 C) (09/18 0400) Pulse Rate:  [77-84] 77 (09/18 0400) Resp:  [16-30] 30 (09/18 0400) BP: (88-111)/(33-59) 91/45 mmHg (09/18 0400) SpO2:  [83 %-100 %] 100 % (09/18 0400) Weight:  [160 lb 11.5 oz (72.9 kg)] 160 lb 11.5 oz (72.9 kg) (09/18 0500) Last BM Date: 03/06/14  Weight change: Filed Weights   03/05/14 0410 03/06/14 0547 03/07/14 0500  Weight: 160 lb 7.9 oz (72.8 kg) 161 lb 3.2 oz (73.12 kg) 160 lb 11.5 oz (72.9 kg)    Intake/Output:   Intake/Output Summary (Last 24 hours) at 03/07/14 0730 Last data filed at 03/07/14 0700  Gross per 24 hour  Intake  230.3 ml  Output    765 ml  Net -534.7 ml     Physical Exam: CVP 3 General:  Elderly man. Lying in bed. No resp difficulty HEENT: normal Neck: supple. JVP flat . Carotids 2+ bilat; no bruits. No lymphadenopathy or thryomegaly appreciated. Cor: PMI laterally displaced. Distant regular.  Lungs: clear Abdomen: soft, nontender, markedly distended. Good bowel sounds, hyperactive BS Extremities: no cyanosis,  clubbing, rash, edema Neuro: alert & orientedx3, cranial nerves grossly intact. moves all 4 extremities w/o difficulty. Affect pleasant  Telemetry: SR 80s  Labs: Basic Metabolic Panel:  Recent Labs Lab 03/03/14 0500 03/04/14 0420 03/05/14 0700 03/06/14 0440 03/07/14 0352  NA 134* 138 137 138 140  K 3.5* 3.4* 3.7 4.3 3.9  CL 93* 98 99 97 98  CO2 26 28 28 28 29   GLUCOSE 142* 104* 178* 139* 159*  BUN 97* 70* 34* 29* 30*  CREATININE 4.16* 2.53* 1.44* 1.46* 1.80*  CALCIUM 8.7 8.6 8.2* 8.6 8.2*    Liver Function Tests:  Recent Labs Lab 03/01/14 1959  AST 43*  ALT 50  ALKPHOS 152*  BILITOT 0.4  PROT 7.2  ALBUMIN 3.6   No results found for this basename: LIPASE, AMYLASE,  in the last 168 hours No results found for this basename: AMMONIA,  in the last 168 hours  CBC:  Recent Labs Lab 03/01/14 1959 03/03/14 0500 03/05/14 0700 03/06/14 0440 03/07/14 0352  WBC 11.9* 15.5* 14.9* 13.8* 11.3*  HGB 12.8* 12.2* 12.1* 11.7* 11.5*  HCT 36.8* 35.0* 36.5* 35.1* 34.7*  MCV 90.6 91.1 93.1 93.4 93.8  PLT 184 166 173 189 196    Cardiac Enzymes:  Recent Labs Lab 03/01/14 1959  TROPONINI 0.97*    BNP: BNP (last 3 results)  Recent Labs  11/07/13 0708 03/01/14 1959  PROBNP 9356.0* 11880.0*     Other results:  EKG:   Imaging:  Dg Chest 2 View  03/06/2014   CLINICAL DATA:  Accidental right PICC line removal by the patient  EXAM: CHEST  2 VIEW  COMPARISON:  Portable chest x-ray of March 01, 2014  FINDINGS: The right-sided PICC line is no longer evident. There is no pneumothorax or pleural effusion. The pulmonary interstitial markings are mildly prominent but stable. There is stable atelectasis in the right middle lobe medially. The cardiac silhouette is top-normal in size. The pulmonary vascularity is prominent centrally. The permanent pacemaker-defibrillator is unchanged in position. The bony thorax is unremarkable.  IMPRESSION: 1. There is no evidence of a post  PICC line removal complication. 2. Low-grade compensated CHF with mild interstitial edema.   Electronically Signed   By: David  Martinique   On: 03/06/2014 16:58   Dg Abd 1 View  03/06/2014   CLINICAL DATA:  Abdominal pain and distention.  EXAM: ABDOMEN - 1 VIEW  COMPARISON:  06/22/2012  FINDINGS: Air-filled loops of small enlarged bowel are noted. There are no dilated loops of small bowel to suggest obstruction. No abnormal abdominal or pelvic calcifications noted.  IMPRESSION: 1. Nonobstructive bowel gas pattern.   Electronically Signed   By: Kerby Moors M.D.   On: 03/06/2014 10:10     Medications:     Scheduled Medications: . amiodarone  200 mg Oral BID  . atorvastatin  20 mg Oral q1800  . magnesium oxide  400 mg Oral Daily  . sildenafil  20 mg Oral TID  . sodium chloride  10-40 mL Intracatheter Q12H  . sodium chloride  3 mL Intravenous Q12H  . torsemide  20 mg Oral Daily  . Warfarin - Pharmacist Dosing Inpatient   Does not apply q1800    Infusions: . milrinone 0.375 mcg/kg/min (03/07/14 0424)    PRN Medications: sodium chloride, acetaminophen, ondansetron (ZOFRAN) IV, sodium chloride, sodium chloride, traMADol   Assessment:   1. Acute on chronic renal failure, stage V 2. Advanced chronic systolic HF d/t NICM EF 02% 3. Pulmonary HTN 4. PAF now in NSR on coumadin 5. Abdominal pain and distension  6. Hypotension 7. 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 Diuretics were placed on hold until yesterday restarted torsemide 20 mg daily. Weight stable and 24 hr I/O -500 cc. Renal function up quite a bit and CVP 3. Will hold diuretics today and restart torsemide 20 mg daily tomorrow. Will call HHRN to go over medication changes with patient.   Patient was switched from tadalafil to revatio 20 mg TID d/t CrCl < 30 and it fluctuates quit a bit at home. Medication approved and  will discuss with patient medication changes.   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 Unfortunately he is nota candidate for chemo or surgery with his severe HF. Dr. Haroldine Laws had lengthy discussion with help of interpreter about prognosis and Palliative Care came by to discuss goals of care with patient.  Home today with HHRN/PT and close follow up next week.   Length of Stay: 6 Rande Brunt NP-C 03/07/2014, 7:30 AM  Advanced Heart Failure Team Pager [phone] (M-F; [place])  Please contact [facility] for night-coverage after hours (4p -7a ) and weekends on [email or web address]  Patient seen and examined with Junie Bame, NP. We discussed  all aspects of the encounter. I agree with the assessment and plan as stated above.   Looks much better. Ready to go home today. Will see in Clinic next week.  Javeria Briski,MD 8:02 AM

## 2014-03-08 ENCOUNTER — Emergency Department (HOSPITAL_COMMUNITY): Payer: Medicare Other

## 2014-03-08 ENCOUNTER — Emergency Department (HOSPITAL_COMMUNITY)
Admission: EM | Admit: 2014-03-08 | Discharge: 2014-03-09 | Disposition: A | Discharge status: Home / Self Care | Payer: Medicare Other | Attending: Emergency Medicine | Admitting: Emergency Medicine

## 2014-03-08 ENCOUNTER — Encounter (HOSPITAL_COMMUNITY): Payer: Self-pay | Admitting: Emergency Medicine

## 2014-03-08 DIAGNOSIS — E7800 Pure hypercholesterolemia, unspecified: Secondary | ICD-10-CM | POA: Diagnosis not present

## 2014-03-08 DIAGNOSIS — I4891 Unspecified atrial fibrillation: Secondary | ICD-10-CM

## 2014-03-08 DIAGNOSIS — Z7901 Long term (current) use of anticoagulants: Secondary | ICD-10-CM | POA: Insufficient documentation

## 2014-03-08 DIAGNOSIS — Z8673 Personal history of transient ischemic attack (TIA), and cerebral infarction without residual deficits: Secondary | ICD-10-CM | POA: Insufficient documentation

## 2014-03-08 DIAGNOSIS — Z8669 Personal history of other diseases of the nervous system and sense organs: Secondary | ICD-10-CM | POA: Insufficient documentation

## 2014-03-08 DIAGNOSIS — I509 Heart failure, unspecified: Secondary | ICD-10-CM | POA: Insufficient documentation

## 2014-03-08 DIAGNOSIS — T82598A Other mechanical complication of other cardiac and vascular devices and implants, initial encounter: Secondary | ICD-10-CM | POA: Diagnosis not present

## 2014-03-08 DIAGNOSIS — I5022 Chronic systolic (congestive) heart failure: Secondary | ICD-10-CM

## 2014-03-08 DIAGNOSIS — Z79899 Other long term (current) drug therapy: Secondary | ICD-10-CM | POA: Insufficient documentation

## 2014-03-08 DIAGNOSIS — I252 Old myocardial infarction: Secondary | ICD-10-CM | POA: Diagnosis not present

## 2014-03-08 DIAGNOSIS — Y832 Surgical operation with anastomosis, bypass or graft as the cause of abnormal reaction of the patient, or of later complication, without mention of misadventure at the time of the procedure: Secondary | ICD-10-CM | POA: Diagnosis not present

## 2014-03-08 DIAGNOSIS — IMO0002 Reserved for concepts with insufficient information to code with codable children: Secondary | ICD-10-CM | POA: Insufficient documentation

## 2014-03-08 DIAGNOSIS — I10 Essential (primary) hypertension: Secondary | ICD-10-CM | POA: Insufficient documentation

## 2014-03-08 DIAGNOSIS — D689 Coagulation defect, unspecified: Secondary | ICD-10-CM

## 2014-03-08 DIAGNOSIS — I454 Nonspecific intraventricular block: Secondary | ICD-10-CM

## 2014-03-08 DIAGNOSIS — T82838A Hemorrhage of vascular prosthetic devices, implants and grafts, initial encounter: Secondary | ICD-10-CM

## 2014-03-08 LAB — CBC
HCT: 33.3 % — ABNORMAL LOW (ref 39.0–52.0)
HEMOGLOBIN: 11.2 g/dL — AB (ref 13.0–17.0)
MCH: 31.2 pg (ref 26.0–34.0)
MCHC: 33.6 g/dL (ref 30.0–36.0)
MCV: 92.8 fL (ref 78.0–100.0)
PLATELETS: 265 10*3/uL (ref 150–400)
RBC: 3.59 MIL/uL — AB (ref 4.22–5.81)
RDW: 14.9 % (ref 11.5–15.5)
WBC: 12.8 10*3/uL — AB (ref 4.0–10.5)

## 2014-03-08 LAB — I-STAT TROPONIN, ED: Troponin i, poc: 0 ng/mL (ref 0.00–0.08)

## 2014-03-08 LAB — PRO B NATRIURETIC PEPTIDE: PRO B NATRI PEPTIDE: 15334 pg/mL — AB (ref 0–450)

## 2014-03-08 NOTE — ED Notes (Signed)
Pt. requesting dressing change at his right arm PICC line , presents with moderate dried blood at dressing , new dressing applied ( Tegaderrm ) at triage , PICC line intact with no bleeding or swelling  at insertion site . No pain .

## 2014-03-08 NOTE — ED Notes (Signed)
Patient transported to X-ray 

## 2014-03-08 NOTE — ED Provider Notes (Signed)
TIME SEEN: 11:20 PM  CHIEF COMPLAINT: Bleeding from PICC line  HPI: Patient is a 80 y.o. M with history of hypertension, hyperlipidemia, CHF who recently received a PICC line in his right upper extremities yesterday for continuous milrinone infusion who presents to the emergency department with bleeding around his PICC line inserted today. He had a new dressing placed and triaged in he has had some dried blood around this area today. He is on Coumadin for atrial fibrillation. He denies that he is having any chest pain, shortness of breath. He states that he is feeling "great". No fevers or cough. No new lower extremity swelling or pain. Denies any arm pain. No numbness or tingling.  ROS: See HPI Constitutional: no fever  Eyes: no drainage  ENT: no runny nose   Cardiovascular:  no chest pain  Resp: no SOB  GI: no vomiting GU: no dysuria Integumentary: no rash  Allergy: no hives  Musculoskeletal: no leg swelling  Neurological: no slurred speech ROS otherwise negative  PAST MEDICAL HISTORY/PAST SURGICAL HISTORY:  Past Medical History  Diagnosis Date  . HTN (hypertension)   . High cholesterol   . CHF (congestive heart failure)   . BBBB (bilateral bundle branch block)   . LBBB (left bundle branch block)   . Weakness   . Tired   . Seizures 06/11/2012    new onset/notes (06/11/2012)  . SOB (shortness of breath)     "sometimes when I lay down;; related to not taking my RX" (06/11/2012)  . Myocardial infarction     06/10/2012  . Atrial fibrillation   . Stroke   . Atrial thrombus     left  . Pulmonary HTN     MEDICATIONS:  Prior to Admission medications   Medication Sig Start Date End Date Taking? Authorizing Provider  amiodarone (PACERONE) 200 MG tablet Take 1 tablet (200 mg total) by mouth 2 (two) times daily. 11/22/13  Yes [name] D Clegg, NP  atorvastatin (LIPITOR) 20 MG tablet Take 1 tablet (20 mg total) by mouth daily at 6 PM. 03/07/14  Yes Rande Brunt, NP  loratadine  (CLARITIN) 10 MG tablet Take 10 mg by mouth daily.   Yes Historical Provider, MD  magnesium oxide (MAG-OX) 400 MG tablet Take 400 mg by mouth daily.   Yes Historical Provider, MD  milrinone (PRIMACOR) 20 MG/100ML SOLN infusion Inject 26.775 mcg/min into the vein continuous. 11/22/13  Yes [name] D Clegg, NP  oxyCODONE 10 MG TABS Take 1 tablet (10 mg total) by mouth every 4 (four) hours as needed for severe pain. 03/07/14  Yes Rande Brunt, NP  potassium chloride SA (K-DUR,KLOR-CON) 20 MEQ tablet Take 1 tablet (20 mEq total) by mouth daily. 03/07/14  Yes Rande Brunt, NP  senna-docusate (SENOKOT-S) 8.6-50 MG per tablet Take 1 tablet by mouth at bedtime. 03/07/14  Yes Rande Brunt, NP  sildenafil (REVATIO) 20 MG tablet Take 1 tablet (20 mg total) by mouth 3 (three) times daily. 03/07/14  Yes Rande Brunt, NP  torsemide (DEMADEX) 20 MG tablet Take 1 tablet (20 mg total) by mouth daily. 03/07/14  Yes Rande Brunt, NP  traMADol (ULTRAM) 50 MG tablet Take 50 mg by mouth every 12 (twelve) hours as needed for moderate pain.   Yes Historical Provider, MD  warfarin (COUMADIN) 4 MG tablet Take 2-4 mg by mouth daily. 4mg  daily except Friday and Sunday takes 2mg    Yes Historical Provider, MD    ALLERGIES:  Allergies  Allergen Reactions  . Lisinopril Rash    SOCIAL HISTORY:  History  Substance Use Topics  . Smoking status: Never Smoker   . Smokeless tobacco: Never Used  . Alcohol Use: No    FAMILY HISTORY: No family history on file.  EXAM: BP 129/84  Pulse 100  Temp(Src) 97.7 F (36.5 C) (Oral)  Resp 24  Wt 167 lb 9 oz (76.006 kg)  SpO2 96% CONSTITUTIONAL: Alert and oriented and responds appropriately to questions. Well-appearing; well-nourished HEAD: Normocephalic EYES: Conjunctivae clear, PERRL ENT: normal nose; no rhinorrhea; moist mucous membranes; pharynx without lesions noted NECK: Supple, no meningismus, no LAD  CARD: RRR; S1 and S2 appreciated; no murmurs, no clicks, no rubs, no  gallops RESP: Normal chest excursion without splinting or tachypnea; breath sounds clear and equal bilaterally; no wheezes, no rhonchi, mild bibasilar rales, no hypoxia, no respiratory distress, no increased work of breathing, speaking full sentences ABD/GI: Normal bowel sounds; non-distended; soft, non-tender, no rebound, no guarding BACK:  The back appears normal and is non-tender to palpation, there is no CVA tenderness EXT: Normal ROM in all joints; non-tender to palpation; minimal peripheral edema in his ankles bilaterally; normal capillary refill; no cyanosis; there is a PICC line in the right upper extremity with some dark red blood surrounding it but no active bleeding, there is an associated hematoma but compartments are soft, 2+ radial pulses bilaterally, equal grip strength, sensation to light touch intact diffusely, no warmth or erythema or fluctuance or purulent drainage  SKIN: Normal color for age and race; warm NEURO: Moves all extremities equally, sensation to light touch intact diffusely PSYCH: The patient's mood and manner are appropriate. Grooming and personal hygiene are appropriate.  MEDICAL DECISION MAKING: Patient here with some bleeding from his PICC line site. There is no active bleeding currently. Will change dressing and applied a pressure dressing. He has no signs of compartment syndrome or infection. He states he is otherwise feeling great with no new chest pain or shortness of breath. Will obtain CBC to evaluate his platelet count, INR to evaluate the status of his Coumadin coagulopathy, chest x-ray to evaluate for placement of his PICC line.  ED PROGRESS: PICC line dressing has been changed there is no further bleeding. His INR is slightly  supratherapeutic at 3. We'll have him hold his dose of Coumadin for the next 2 days. His platelets are otherwise normal. Chest x-ray shows normal placement of his PICC line. His BNP is elevated and his chest x-ray did show mild  interstitial edema but he has known history of severe cardiomyopathy and is on a milrinone drip. He reports that he is feeling fantastic and much better than when he was admitted to the hospital. He is not hypoxic and has no increased work of breathing. He is not tachypnea currently. I feel he is safe to be discharged home. Have discussed strict return precautions and supportive care instructions. He verbalizes understanding and is comfortable with plan.    Fernando Salinas, DO 03/09/14 [phone]

## 2014-03-08 NOTE — ED Notes (Signed)
Patient is in x-ray on new room assignment, B14.

## 2014-03-09 LAB — BASIC METABOLIC PANEL
ANION GAP: 17 — AB (ref 5–15)
BUN: 27 mg/dL — ABNORMAL HIGH (ref 6–23)
CHLORIDE: 100 meq/L (ref 96–112)
CO2: 22 mEq/L (ref 19–32)
Calcium: 7.9 mg/dL — ABNORMAL LOW (ref 8.4–10.5)
Creatinine, Ser: 1.64 mg/dL — ABNORMAL HIGH (ref 0.50–1.35)
GFR calc Af Amer: 44 mL/min — ABNORMAL LOW (ref >90)
GFR calc non Af Amer: 38 mL/min — ABNORMAL LOW (ref >90)
Glucose, Bld: 132 mg/dL — ABNORMAL HIGH (ref 70–99)
POTASSIUM: 3.6 meq/L — AB (ref 3.7–5.3)
SODIUM: 139 meq/L (ref 137–147)

## 2014-03-09 LAB — PROTIME-INR
INR: 3.5 — AB (ref 0.00–1.49)
Prothrombin Time: 35.1 seconds — ABNORMAL HIGH (ref 11.6–15.2)

## 2014-03-09 NOTE — ED Notes (Signed)
Discussed plan of care with Dr. Leonides Schanz to update patient, awaiting BMP from lab to result.

## 2014-03-09 NOTE — Discharge Instructions (Signed)
Please hold your Coumadin tomorrow and Monday. He may restart this medication Tuesday morning. Please followup with your doctor next week to have your INR rechecked. If you develop bleeding around her PICC line site, please hold pressure for 15 minutes continuously. If this does not stop the bleeding, please return to the hospital.   Catter central de insercin perifrica (CCIP): gua para Engineer, mining (PICC Home Guide) Un catter central insertado perifricamente (CCIP) es un tubo [place], delgado y flexible que se inserta en la parte superior de una vena del brazo. Es una forma de acceso intravenoso (IV). Se considera una lnea "central" porque la punta del catter finaliza en una vena grande del trax. Esta vena grande se denomina vena cava superior. La punta del catter se inserta en la vena cava superior porque esta vena tiene un gran flujo de sangre. Esto permite que los lquidos y medicamentos se distribuyan rpidamente por todo el cuerpo. Al catter lo inserta un enfermero o un mdico, utilizando una tcnica estril. Luego de que se Sara Lee, se hace una radiografa de trax para asegurarse de que est ubicado correctamente.  Puede ser necesario colocar el catter por diferentes motivos, por ejemplo:  Para administrar medicamentos y lquidos que solo pueden darse a travs de una lnea central. Algunos ejemplos son:  Ciertos tratamientos con antibiticos.  Quimioterapia.  Nutricin parenteral total (NPT).  Para tomar muestras de sangre con frecuencia.  Para administrar lquidos y productos de Herbalist.  Si hay dificultad para colocar un catter intravenoso perifrico. Si se cuida adecuadamente, el catter [employer] en su lugar durante varios meses. Un CCIP tambin permite que la persona abandone el hospital y regrese a su casa ms rpidamente. Un familiar o un equipo de atencin mdica a domicilio pueden encargarse de los medicamentos y del cuidado del Charity fundraiser. QU  PROBLEMAS SE PUEDEN PRESENTAR SI TENGO UN CCIP? En ocasiones, es posible que haya problemas con un CCIP. Estas pueden ser:  Elna Breslow de un cogulo sanguneo (trombo) en el CCIP o en la punta de [place]. Esto puede hacer que se obstruya. Para ayudar a disolver el cogulo, posiblemente se administre un tromboltico denominado activador tisular del plasmingeno (tPA, por sus siglas en ingls).  Inflamacin de la vena (flebitis) en la que se coloc el CCIP. Los signos de inflamacin pueden ser el enrojecimiento y Conservation officer, historic buildings en el sitio de la insercin, rayas rojas, o sentir un "cordn" en la vena en que se insert [employer].  Infeccin en el CCIP o en el lugar de la insercin. Los signos de infeccin son la fiebre, escalofros, enrojecimiento, hinchazn o supuracin de pus en el lugar de insercin.  Movimiento del CCIP (desplazamiento). La punta del CCIP se puede mover de su posicin original debido a una actividad fsica excesiva, tos forzada, estornudos o vmitos.  Un corte o rotura del catter. Es importante no utilizar tijeras cerca del catter.  Irritacin o lesin del nervio o tendn durante la insercin del CCIP. [place] EN CUENTA SOBRE LAS ACTIVIDADES CUANDO [place]?  Puede inclinar el brazo y [employer]. Si el catter est en el codo o cerca de l, evite las actividades que le exijan moverlo reiteradamente.  Haga reposo en su casa durante el resto del da luego de la insercin del CCIP.  Evite levantar objetos pesados segn las indicaciones del mdico.  Evite usar muletas con el brazo en el que est el catter. Podra necesitar un andador. QU DEBO  SABER SOBRE EL VENDAJE DEL CCIP?  Mantenga el vendaje del CCIP limpio y seco para evitar infecciones.  Pregntele al mdico cundo puede tomar [place]. Pdale al mdico que le ensee cmo vendar el CCIP cuando tome una ducha.  Cambie el vendaje del CCIP segn las indicaciones del mdico.  Cmbielo si est flojo o  mojado. QU DEBO SABER SOBRE EL CUIDADO DEL CCIP?  Controle el lugar de la insercin del CCIP diariamente para ver si hay secreciones, enrojecimiento, hinchazn o dolor.  No se bae, no practique natacin ni use el jacuzzi cuando tenga colocado un CCIP. Cubra el CCIP con un envoltorio de plstico limpio y cinta adhesiva para que no se moje mientras se ducha.  Purgue el CCIP segn las indicaciones del mdico. Informe de inmediato al mdico si le resulta difcil purgar el CCIP o no puede hacerlo. No use la fuerza para Education officer, community.  No use una jeringa de menos de 8ml para Education officer, community.  Nunca tire del catter.  Evite los controles de la presin arterial en el brazo en que se insert el CCIP.  Lleve la identificacin del CCIP siempre con usted.  No se quite el CCIP por su cuenta. Solo un profesional entrenado puede [place]. SOLICITE ATENCIN MDICA DE INMEDIATO SI:  El catter se Advertising account planner. Si esto ocurre, [place] el sitio de la insercin con un vendaje o una gasa. No deseche el CCIP, ya que Psychologist, counselling.  El catter fue tironeado y se ha salido parcialmente. No  trate de volver a Engineer, production.  Hay algn tipo de secrecin, enrojecimiento o hinchazn en el lugar en donde se inserta el catter en la piel.  Usted no puede purgarlo, le resulta difcil hacerlo o pierde lquido alrededor del sitio de insercin cuando lo hace.  Escucha un ruido al purgarlo.  Siente dolor, molestias o adormecimiento en el brazo, el hombro o la mandbula del lado en donde se insert [employer]. [employer].  Siente que el corazn late rpidamente o menos latidos.  Nota un agujero o rotura en Print production planner.  Presenta escalofros o fiebre. ASEGRESE DE QUE:   Comprende estas instrucciones.  Controlar su afeccin.  Recibir ayuda de inmediato si no mejora o si empeora. Document Released: 03/15/2008 Document Revised: 03/27/2013 [facility] Patient Information 2015 [place], [place]. This  information is not intended to replace advice given to you by your health care provider. Make sure you discuss any questions you have with your health care provider.    Warfarina: lo que debe saber (Warfarin: What You Need to Know) La warfarina es un anticoagulante. Los anticoagulantes ayudan a prevenir la formacin de cogulos de [place]. Tambin ayudan a Recruitment consultant de cogulos de [place]. A veces, se dice que la warfarina es un "anticoagulante".  Normalmente, cuando hay un corte o lesin en los tejidos, los cogulos impiden la prdida de [place]. A veces, se forman cogulos dentro de los vasos sanguneos que obstruyen el flujo sanguneo a travs del sistema circulatorio (trombosis). Estos cogulos pueden viajar por el torrente sanguneo y se alojan en los vasos sanguneos pequeos del cerebro, lo que puede causar un ictus, o en los pulmones (embolia pulmonar). [place] DEBEN UTILIZAR WARFARINA La warfarina se receta en aquellos pacientes que tienen riesgo de desarrollar cogulos sanguneos peligrosos:  Las personas que tienen una vlvula cardaca mecnica, un ritmo cardaco irregular llamado fibrilacin auricular o ciertos trastornos de la coagulacin.  Las personas que tuvieron cogulos sanguneos peligrosos en el pasado,  incluidas las que tuvieron un ictus, una embolia pulmonar o trombosis en las piernas (trombosis venosa profunda [TVP]).  Las personas con un cogulo sanguneo existente, como una embolia pulmonar. DOSIS DE [place] comprimidos de warfarina vienen en diferentes concentraciones. Cada uno de ellos es de diferente color y tiene la cantidad de warfarina (en miligramos) claramente impresa en el comprimido. Si cuando le dan una nueva receta el color del comprimido es diferente al habitual, infrmelo inmediatamente a su farmacutico o mdico. SUPERVISIN DE LA WARFARINA El objetivo del tratamiento con warfarina es disminuir la formacin de cogulos, pero no impedir  completamente la coagulacin. Su mdico supervisar atentamente el efecto anticoagulante de la warfarina y ajustar la dosis segn sea necesario. Por su seguridad, se usan anlisis de sangre llamados tiempo de protrombina (PT) o ndice internacional normalizado (INR). Ambos anlisis pueden hacerse con un pinchazo en el dedo o con una extraccin de [place]. Cuanto ms tiempo necesite la sangre para Designer, fashion/clothing, mayor ser el nivel de [place] o de INR. Su mdico le informar cul es su rango de PT o INR "deseado". Si en cualquier momento su PT o INR estn por encima del rango deseado, existe riesgo de hemorragia. Si el nivel de PT o INR es menor que el rango deseado, existe riesgo de formar cogulos. Ya sea que comience a recibir Dealer est hospitalizado o en el consultorio de su mdico, deber controlarse el PT o INR dentro de la primera semana de Academic librarian. Inicialmente, a Psychologist, clinical se les indica que se controlen el PT o INR [employer] veces por semana. Una vez que se haya encontrado la dosis de mantenimiento, Lobbyist PT o INR con menos frecuencia, generalmente una vez cada dos a cuatro semanas. La dosis de warfarina podra ajustarse si el PT o INR no estn dentro del rango deseado. Es importante que cumpla con los controles de laboratorio y las consultas mdicas que le indiquen. No cumplir con las visitas puede resultar en una lesin, dolor o discapacidad crnica o permanente debido a que la warfarina es un medicamento que requiere una estrecha vigilancia. CULES SON LOS EFECTOS ADVERSOS DE LA [place]?  Mucha cantidad de warfarina puede producir sangrado (hemorragia) en cualquier parte del cuerpo. Podra ser sangrado de las encas, sangre en la [place], materia fecal con sangre u oscura, hemorragia nasal que no frena fcilmente, escupir sangre al toser o vomitar con P[employer] Corporation.  Muy poca cantidad de warfarina puede aumentar el riesgo de cogulos sanguneos.  Muy poca o mucha  warfarina tambin puede aumentar el riesgo de ictus.  La warfarina puede causar una erupcin cutnea o irritacin, fiebre inusual, nuseas o Higher education careers adviser constantes o dolor intenso en las articulaciones o la espalda. PRECAUCIONES ESPECIALES MIENTRAS SE EST TOMANDO WARFARINA La warfarina debe ser tomada exactamente como se le indic. Es muy importante que reciba la warfarina segn las indicaciones, ya que podra tener tanto una hemorragia como cogulos sanguneos que terminen produciendo una lesin, Social research officer, government o discapacidad crnica o [place].  [employer] a la misma hora. Si olvida tomar una dosis, puede tomarla si an no pasaron 6horas del momento correspondiente.  No modifique la dosis de warfarina por sus propios medios para compensar la dosis que ha omitido.  Si omite ms de dos dosis consecutivas, comunquese con su mdico para que le d indicaciones. Evite las situaciones que le puedan producir hemorragias. Puede tener tendencia a sangrar ms fcilmente que lo habitual mientras toma warfarina. Las  siguientes recomendaciones pueden limitar el sangrado:  Utilice un cepillo de dientes blando.  Utilice un hilo dental encerado.  Rasrese con Geographical information systems officer y no con hoja de Public relations account executive.  Limite el uso de objetos afilados.  Evite las actividades potencialmente peligrosas, como los deportes de [place]. Warfarina y Media planner o amamantamiento  No se aconseja el uso de warfarina durante Software engineer trimestre del embarazo debido al aumento de riesgo para el beb de tener defectos congnitos. En ciertas situaciones, una mujer puede tomar warfarina despus del primer trimestre del embarazo. Una mujer que queda embarazada o planifica estarlo mientras toma warfarina debe informarlo al mdico inmediatamente.  Aunque la warfarina no pasa hacia la [place], una mujer que desea Counselling psychologist est tomando warfarina debe consultarlo con su mdico. Consumo de alcohol,  tabaquismo y drogas ilegales   El alcohol afecta la forma en que la warfarina funciona en el organismo. Es Museum/gallery conservator las bebidas alcohlicas o consumir muy pequeas cantidades cuando se est en tratamiento con warfarina. En general, debe limitar el consumo de alcohol a 1onza (76DY) de licor, 6onzas (709KH) de vino o 12onzas (355ml) de cerveza por da. Hgale saber a su mdico si modifica su consumo de alcohol.  El tabaquismo afecta el funcionamiento de la warfarina. Lo ideal es evitar fumar cuando se est en tratamiento con warfarina. Hgale saber a su mdico si modifica su hbito de tabaquismo.  Lo ideal es evitar todas las drogas ilegales cuando se est en tratamiento con warfarina porque existen pocos estudios que muestren cmo interacta con estas drogas. Otros medicamentos y suplementos nutricionales Muchos medicamentos recetados y de venta [place] pueden interferir con la warfarina. Asegrese de informar a todos sus mdicos que est tomando warfarina. Notifique al mdico que le recet la warfarina o al farmacutico antes de comenzar o interrumpir cualquier otro medicamento, incluso las vitaminas, los suplementos dietarios y los medicamentos para el dolor de [place]. Es posible que deba ajustar la dosis de warfarina. Algunos medicamentos de [employer]ys ''R[employer] Us pueden aumentar el riesgo de sangrado mientras est tomando warfarina son los siguientes:  Paracetamol.  Aspirina.  Antiinflamatorios no esteroides (AINE), como ibuprofeno o naproxeno.  Vitamina E. Consideraciones nutricionales Los alimentos con cantidades moderadas o altas de vitaminaK interfieren con la warfarina. Evite cambios importantes en su dieta, o hable con su mdico antes de cambiarla. Consuma una cantidad consistente de alimentos con un nivel moderado o alto de vitaminaK. Consumir menos alimentos con vitaminaK puede aumentar el riesgo de [place]. Consumir ms de NIKE puede aumentar el riesgo de  cogulos sanguneos. Consulte a un nutricionista si tiene otras preguntas sobre las consideraciones en la dieta. Alimentos que tienen un nivel muy alto de vitaminaK:  Los vegetales, como la acelga suiza y Music therapist, y las hojas de [place], [place] o nabo (frescos o congelados, cocidos).  Col rizada (fresca o congelada, cocida).  Perejil (crudo).  Espinaca (cocida). Alimentos que tienen un nivel alto de vitaminaK:  Investment banker, operational (frescos, cocidos).  Frijoles verdes (frescos, cocidos).  Brcoli.  Repollo chino (cocido).  Repollitos de [place]uselas (frescos o congelados, cocidos).  Repollo (cocido).   Col. Alimentos que tienen un nivel moderado de vitaminaK:  Arndanos.  Frijoles carita.  Endivia (cruda).  Valeda Malm de hoja verde (cruda).  Cebollines verdes (crudos).  Col rizada (cruda).  Quimbomb (congelado, cocido).  Pltanos (fritos).  Priscille Loveless (cruda).  Chucrut (en lata).  Espinaca (cruda). LLAME A SU CLNICA O MDICO SI USTED:  Planifica someterse a [place] o un  procedimiento.  Se siente enfermo, especialmente si tiene diarrea o vmitos.  Hace o planifica hacer cambios importantes en la dieta.  Comienza a tomar o interrumpe medicamentos recetados o de venta libre.  Thyra Breed, planifica quedar o cree estarlo.  Tiene perodos menstruales ms abundantes.  Sufre una cada, un accidente o tiene sntomas de sangrado o hematomas inusuales.  Tiene fiebre inusual. LLAME AL 911 EN LOS ESTADOS UNIDOS O CONCURRA AL SERVICIO DE EMERGENCIAS SI USTED:   Cree que tiene una reaccin alrgica a la warfarina. Los signos de [place] reaccin alrgica pueden ser picazn, erupcin, ronchas, hinchazn, opresin en el pecho o problemas para respirar.  Observa sangre en la orina. Los signos pueden ser orina de color t, rojizo o rosado.  Observa sangre en las heces. Los signos pueden ser heces negras o rojo brillante.  Vomita o tose con sangre. En estos  casos, la sangre podra ser de color rojo brillante o tener la apariencia de "caf molido".  Tiene sangrado que no se interrumpe despus de aplicar presin durante 56minutos, por ejemplo cortes, sangrado de la nariz u otras lesiones.  Tiene dolor intenso en las articulaciones o la espalda.  Tiene dolor de cabeza intenso.  Siente debilidad o adormecimiento sbito de la cara, el brazo o la pierna, especialmente en un lado del cuerpo.  Siente confusin o problemas para comprender sbitamente.  Tiene dificultad sbita en la visin de uno o ambos ojos.  Tiene problemas para caminar, mareos, prdida del equilibrio o de la coordinacin sbitamente.  Tiene dificultad para hablar o comprender (afasia). Document Released: 09/21/2006 Document Revised: 10/21/2013 [facility] Patient Information 2015 [place]. This information is not intended to replace advice given to you by your health care provider. Make sure you discuss any questions you have with your health care provider.

## 2014-03-11 ENCOUNTER — Ambulatory Visit (INDEPENDENT_AMBULATORY_CARE_PROVIDER_SITE_OTHER): Payer: Medicare Other | Admitting: Cardiology

## 2014-03-11 ENCOUNTER — Telehealth (HOSPITAL_COMMUNITY): Payer: Self-pay | Admitting: Vascular Surgery

## 2014-03-11 LAB — POCT INR: INR: 2.9

## 2014-03-11 NOTE — Telephone Encounter (Signed)
Left message for RN [name], per Dr. Haroldine Laws needs to be set up for right heart cath.  Will wait for return call to assess whether or not he needs tobe seen Thursday in our clinic as scheduled first or if procedure needs to be scheduled beforehand. Renee Pain

## 2014-03-11 NOTE — Telephone Encounter (Signed)
[facility] called his O2 STATS ARE LOW LOW AS 80 AFTER AMBULATED 30 FEET KITCHEN AND BACK

## 2014-03-12 DIAGNOSIS — Z515 Encounter for palliative care: Secondary | ICD-10-CM

## 2014-03-12 DIAGNOSIS — Z7189 Other specified counseling: Secondary | ICD-10-CM

## 2014-03-13 ENCOUNTER — Encounter (HOSPITAL_COMMUNITY): Payer: Self-pay | Admitting: Emergency Medicine

## 2014-03-13 ENCOUNTER — Emergency Department (HOSPITAL_COMMUNITY): Payer: Medicare Other

## 2014-03-13 ENCOUNTER — Emergency Department (HOSPITAL_COMMUNITY)
Admission: EM | Admit: 2014-03-13 | Discharge: 2014-03-13 | Disposition: A | Discharge status: Home / Self Care | Payer: Medicare Other | Source: Home / Self Care | Attending: Emergency Medicine | Admitting: Emergency Medicine

## 2014-03-13 ENCOUNTER — Inpatient Hospital Stay (HOSPITAL_COMMUNITY): Payer: Medicare Other

## 2014-03-13 DIAGNOSIS — I10 Essential (primary) hypertension: Secondary | ICD-10-CM

## 2014-03-13 DIAGNOSIS — I5023 Acute on chronic systolic (congestive) heart failure: Secondary | ICD-10-CM | POA: Diagnosis present

## 2014-03-13 DIAGNOSIS — E43 Unspecified severe protein-calorie malnutrition: Secondary | ICD-10-CM | POA: Diagnosis present

## 2014-03-13 DIAGNOSIS — E7800 Pure hypercholesterolemia, unspecified: Secondary | ICD-10-CM | POA: Insufficient documentation

## 2014-03-13 DIAGNOSIS — Y849 Medical procedure, unspecified as the cause of abnormal reaction of the patient, or of later complication, without mention of misadventure at the time of the procedure: Secondary | ICD-10-CM

## 2014-03-13 DIAGNOSIS — Y95 Nosocomial condition: Secondary | ICD-10-CM | POA: Diagnosis present

## 2014-03-13 DIAGNOSIS — Z95828 Presence of other vascular implants and grafts: Secondary | ICD-10-CM

## 2014-03-13 DIAGNOSIS — T827XXA Infection and inflammatory reaction due to other cardiac and vascular devices, implants and grafts, initial encounter: Secondary | ICD-10-CM | POA: Diagnosis present

## 2014-03-13 DIAGNOSIS — N183 Chronic kidney disease, stage 3 (moderate): Secondary | ICD-10-CM | POA: Diagnosis present

## 2014-03-13 DIAGNOSIS — I509 Heart failure, unspecified: Secondary | ICD-10-CM | POA: Insufficient documentation

## 2014-03-13 DIAGNOSIS — Z7901 Long term (current) use of anticoagulants: Secondary | ICD-10-CM

## 2014-03-13 DIAGNOSIS — E785 Hyperlipidemia, unspecified: Secondary | ICD-10-CM | POA: Diagnosis present

## 2014-03-13 DIAGNOSIS — Z79899 Other long term (current) drug therapy: Secondary | ICD-10-CM

## 2014-03-13 DIAGNOSIS — D689 Coagulation defect, unspecified: Secondary | ICD-10-CM | POA: Diagnosis present

## 2014-03-13 DIAGNOSIS — T82598A Other mechanical complication of other cardiac and vascular devices and implants, initial encounter: Secondary | ICD-10-CM | POA: Insufficient documentation

## 2014-03-13 DIAGNOSIS — A4101 Sepsis due to Methicillin susceptible Staphylococcus aureus: Principal | ICD-10-CM | POA: Diagnosis present

## 2014-03-13 DIAGNOSIS — D649 Anemia, unspecified: Secondary | ICD-10-CM | POA: Diagnosis present

## 2014-03-13 DIAGNOSIS — I4891 Unspecified atrial fibrillation: Secondary | ICD-10-CM

## 2014-03-13 DIAGNOSIS — I252 Old myocardial infarction: Secondary | ICD-10-CM

## 2014-03-13 DIAGNOSIS — R1900 Intra-abdominal and pelvic swelling, mass and lump, unspecified site: Secondary | ICD-10-CM | POA: Diagnosis present

## 2014-03-13 DIAGNOSIS — Z66 Do not resuscitate: Secondary | ICD-10-CM | POA: Diagnosis present

## 2014-03-13 DIAGNOSIS — I480 Paroxysmal atrial fibrillation: Secondary | ICD-10-CM | POA: Diagnosis present

## 2014-03-13 DIAGNOSIS — Z8669 Personal history of other diseases of the nervous system and sense organs: Secondary | ICD-10-CM | POA: Insufficient documentation

## 2014-03-13 DIAGNOSIS — R6520 Severe sepsis without septic shock: Secondary | ICD-10-CM | POA: Diagnosis present

## 2014-03-13 DIAGNOSIS — I272 Other secondary pulmonary hypertension: Secondary | ICD-10-CM | POA: Diagnosis present

## 2014-03-13 DIAGNOSIS — Z889 Allergy status to unspecified drugs, medicaments and biological substances status: Secondary | ICD-10-CM

## 2014-03-13 DIAGNOSIS — Z95810 Presence of automatic (implantable) cardiac defibrillator: Secondary | ICD-10-CM

## 2014-03-13 DIAGNOSIS — Z8673 Personal history of transient ischemic attack (TIA), and cerebral infarction without residual deficits: Secondary | ICD-10-CM

## 2014-03-13 DIAGNOSIS — N179 Acute kidney failure, unspecified: Secondary | ICD-10-CM | POA: Diagnosis present

## 2014-03-13 DIAGNOSIS — Z86718 Personal history of other venous thrombosis and embolism: Secondary | ICD-10-CM

## 2014-03-13 DIAGNOSIS — I129 Hypertensive chronic kidney disease with stage 1 through stage 4 chronic kidney disease, or unspecified chronic kidney disease: Secondary | ICD-10-CM | POA: Diagnosis present

## 2014-03-13 DIAGNOSIS — B958 Unspecified staphylococcus as the cause of diseases classified elsewhere: Secondary | ICD-10-CM | POA: Diagnosis present

## 2014-03-13 DIAGNOSIS — I429 Cardiomyopathy, unspecified: Secondary | ICD-10-CM | POA: Diagnosis present

## 2014-03-13 DIAGNOSIS — J189 Pneumonia, unspecified organism: Secondary | ICD-10-CM | POA: Diagnosis present

## 2014-03-13 DIAGNOSIS — J9601 Acute respiratory failure with hypoxia: Secondary | ICD-10-CM | POA: Diagnosis present

## 2014-03-13 DIAGNOSIS — Y848 Other medical procedures as the cause of abnormal reaction of the patient, or of later complication, without mention of misadventure at the time of the procedure: Secondary | ICD-10-CM | POA: Diagnosis present

## 2014-03-13 DIAGNOSIS — I452 Bifascicular block: Secondary | ICD-10-CM | POA: Diagnosis present

## 2014-03-13 DIAGNOSIS — R509 Fever, unspecified: Secondary | ICD-10-CM | POA: Diagnosis not present

## 2014-03-13 DIAGNOSIS — J9811 Atelectasis: Secondary | ICD-10-CM | POA: Diagnosis present

## 2014-03-13 DIAGNOSIS — Z23 Encounter for immunization: Secondary | ICD-10-CM

## 2014-03-13 NOTE — ED Notes (Signed)
Patient presents with his PICC line partially pulled out while the [place] was running.  IV stopped and home health nurse brought him here.

## 2014-03-13 NOTE — ED Notes (Signed)
Patient back to A-12 from X-ray.

## 2014-03-13 NOTE — ED Notes (Signed)
Discharge instructions reviewed with home health nurse.  Voiced understanding.

## 2014-03-13 NOTE — ED Notes (Signed)
Patient transported to X-ray 

## 2014-03-13 NOTE — Discharge Instructions (Signed)
It is ok to keep infusing milrinone through R arm PICC. Interventional Radiology scheduling is not available at this time of day. You need to call to make an appointment in the morning. You need to touch base with your cardiologist/Heart Failure Team.

## 2014-03-13 NOTE — ED Provider Notes (Signed)
CSN: [id number]     Arrival date & time 03/13/14  1928 History   First MD Initiated Contact with Patient 03/13/14 1958     Chief Complaint  Patient presents with  . PICC line pulled out      (Consider location/radiation/quality/duration/timing/severity/associated sxs/prior Treatment) HPI  81yM presenting after R arm PICC withdrawn. Getting milrinone infusion. Has hx of chronic systolic HF w/ EF 02-77%. Accidentally withdrawn several cm shortly before arrival. Nurse stopped infusion. Pt with no acute complaints. Language barrier.    Past Medical History  Diagnosis Date  . HTN (hypertension)   . High cholesterol   . CHF (congestive heart failure)   . BBBB (bilateral bundle branch block)   . LBBB (left bundle branch block)   . Weakness   . Tired   . Seizures 06/11/2012    new onset/notes (06/11/2012)  . SOB (shortness of breath)     "sometimes when I lay down;; related to not taking my RX" (06/11/2012)  . Myocardial infarction     06/10/2012  . Atrial fibrillation   . Stroke   . Atrial thrombus     left  . Pulmonary HTN    Past Surgical History  Procedure Laterality Date  . Throat surgery  1942    "and nose" (06/11/2012); ?T&A  . Inguinal hernia repair  ~ 2008    "both sides" (06/11/2012)  . Tee without cardioversion  06/29/2012    Procedure: TRANSESOPHAGEAL ECHOCARDIOGRAM (TEE);  Surgeon: Jolaine Artist, MD;  Location: [facility] ENDOSCOPY;  Service: Cardiovascular;  Laterality: N/A;  will need a spanish interpreter Interpreter will be here at 1330-Hope spoke w/ Law[place]  . Cardioversion N/A 08/14/2012    Procedure: CARDIOVERSION;  Surgeon: Jolaine Artist, MD;  Location: [facility] ENDOSCOPY;  Service: Cardiovascular;  Laterality: N/A;  . Tee without cardioversion N/A 11/12/2013    Procedure: TRANSESOPHAGEAL ECHOCARDIOGRAM (TEE);  Surgeon: Candee Furbish, MD;  Location: [facility] ENDOSCOPY;  Service: Cardiovascular;  Laterality: N/A;  . Tee without cardioversion N/A 11/19/2013    Procedure:  TRANSESOPHAGEAL ECHOCARDIOGRAM (TEE);  Surgeon: Jolaine Artist, MD;  Location: [facility] ENDOSCOPY;  Service: Cardiovascular;  Laterality: N/A;  . Cardioversion N/A 12/27/2013    Procedure: CARDIOVERSION;  Surgeon: Jolaine Artist, MD;  Location: [facility] ENDOSCOPY;  Service: Cardiovascular;  Laterality: N/A;   No family history on file. History  Substance Use Topics  . Smoking status: Never Smoker   . Smokeless tobacco: Never Used  . Alcohol Use: No    Review of Systems  All systems reviewed and negative, other than as noted in HPI.   Allergies  Lisinopril  Home Medications   Prior to Admission medications   Medication Sig Start Date End Date Taking? Authorizing Provider  amiodarone (PACERONE) 200 MG tablet Take 1 tablet (200 mg total) by mouth 2 (two) times daily. 11/22/13   [name] D Ninfa Meeker, NP  atorvastatin (LIPITOR) 20 MG tablet Take 1 tablet (20 mg total) by mouth daily at 6 PM. 03/07/14   Rande Brunt, NP  loratadine (CLARITIN) 10 MG tablet Take 10 mg by mouth daily.    Historical Provider, MD  magnesium oxide (MAG-OX) 400 MG tablet Take 400 mg by mouth daily.    Historical Provider, MD  milrinone (PRIMACOR) 20 MG/100ML SOLN infusion Inject 26.775 mcg/min into the vein continuous. 11/22/13   [name] D Ninfa Meeker, NP  oxyCODONE 10 MG TABS Take 1 tablet (10 mg total) by mouth every 4 (four) hours as needed for severe pain. 03/07/14  Rande Brunt, NP  potassium chloride SA (K-DUR,KLOR-CON) 20 MEQ tablet Take 1 tablet (20 mEq total) by mouth daily. 03/07/14   Rande Brunt, NP  senna-docusate (SENOKOT-S) 8.6-50 MG per tablet Take 1 tablet by mouth at bedtime. 03/07/14   Rande Brunt, NP  sildenafil (REVATIO) 20 MG tablet Take 1 tablet (20 mg total) by mouth 3 (three) times daily. 03/07/14   Rande Brunt, NP  torsemide (DEMADEX) 20 MG tablet Take 1 tablet (20 mg total) by mouth daily. 03/07/14   Rande Brunt, NP  traMADol (ULTRAM) 50 MG tablet Take 50 mg by mouth every 12 (twelve) hours as needed  for moderate pain.    Historical Provider, MD  warfarin (COUMADIN) 4 MG tablet Take 2-4 mg by mouth daily. 4mg  daily except Friday and Sunday takes 2mg     Historical Provider, MD   BP 115/57  Pulse 96  Temp(Src) 97.8 F (36.6 C) (Oral)  Resp 18  Ht 5\' 4"  (1.626 m)  Wt 162 lb (73.483 kg)  BMI 27.79 kg/m2  SpO2 96% Physical Exam  Nursing note and vitals reviewed. Constitutional: He appears well-developed and well-nourished. No distress.  HENT:  Head: Normocephalic and atraumatic.  Eyes: Conjunctivae are normal. Right eye exhibits no discharge. Left eye exhibits no discharge.  Neck: Neck supple.  Cardiovascular: Normal rate, regular rhythm and normal heart sounds.  Exam reveals no gallop and no friction rub.   No murmur heard. R arm picc site with transparent dressing in place. Insertion site looks fine.   Pulmonary/Chest: Effort normal and breath sounds normal. No respiratory distress.  Abdominal: Soft. He exhibits no distension. There is no tenderness.  Musculoskeletal: He exhibits no edema and no tenderness.  Neurological: He is alert.  Skin: Skin is warm and dry.  Psychiatric: He has a normal mood and affect. His behavior is normal. Thought content normal.    ED Course  Procedures (including critical care time) Labs Review Labs Reviewed - No data to display  Imaging Review Dg Chest 2 View  03/13/2014   CLINICAL DATA:  Evaluate PICC line location.  EXAM: CHEST  2 VIEW  COMPARISON:  Chest radiograph 03/08/2014  FINDINGS: Interval retraction of right upper extremity PICC line with tip projecting at the central right brachiocephalic vein. Multi lead pacer apparatus overlies the left hemi thorax, leads are stable in position. Stable cardiomegaly. Persistent elevation right hemidiaphragm. Unchanged bilateral lower lung heterogeneous pulmonary opacities. No definite pleural effusion or pneumothorax. Regional skeleton is unremarkable.  IMPRESSION: Interval retraction of right upper  extremity PICC line with tip projecting over the central right brachiocephalic vein.  Cardiomegaly with mild interstitial pulmonary edema.   Electronically Signed   By: Lovey Newcomer M.D.   On: 03/13/2014 22:01     EKG Interpretation None      MDM   Final diagnoses:  S/P PICC central line placement    81yM presenting after PICC withdrawn several cm. In distal SVC on recent CXR. May still be adequately positioned despite being withdrawn some. Will check another XR for positioning.   Tip of PICC in R innominate vein. Positioned in line with SVC. Not ideally placed, but adequate temporarily until can be repositioned/replaced particularly when alternative would be to run through peripheral line. Discussed with pt's nurse who accompanied to ED. Unfortunately, IR scheduling not available this time of day. Given information for IR scheduling and need to call in am.   Virgel Manifold, MD 03/14/14 0005

## 2014-03-13 NOTE — ED Notes (Signed)
IV removed from the left wrist, cath intact and site without redness or swelling

## 2014-03-14 ENCOUNTER — Other Ambulatory Visit (HOSPITAL_COMMUNITY): Payer: Self-pay | Admitting: Internal Medicine

## 2014-03-14 ENCOUNTER — Ambulatory Visit (HOSPITAL_COMMUNITY)
Admission: RE | Admit: 2014-03-14 | Discharge: 2014-03-14 | Disposition: A | Discharge status: Home / Self Care | Payer: Medicare Other | Source: Ambulatory Visit | Attending: Internal Medicine | Admitting: Internal Medicine

## 2014-03-14 ENCOUNTER — Other Ambulatory Visit (HOSPITAL_COMMUNITY): Payer: Self-pay

## 2014-03-14 ENCOUNTER — Telehealth (HOSPITAL_COMMUNITY): Payer: Self-pay

## 2014-03-14 DIAGNOSIS — I10 Essential (primary) hypertension: Secondary | ICD-10-CM | POA: Insufficient documentation

## 2014-03-14 DIAGNOSIS — I5032 Chronic diastolic (congestive) heart failure: Secondary | ICD-10-CM

## 2014-03-14 DIAGNOSIS — Z7901 Long term (current) use of anticoagulants: Secondary | ICD-10-CM | POA: Insufficient documentation

## 2014-03-14 DIAGNOSIS — E7800 Pure hypercholesterolemia, unspecified: Secondary | ICD-10-CM

## 2014-03-14 DIAGNOSIS — Z452 Encounter for adjustment and management of vascular access device: Secondary | ICD-10-CM | POA: Insufficient documentation

## 2014-03-14 DIAGNOSIS — I509 Heart failure, unspecified: Secondary | ICD-10-CM | POA: Insufficient documentation

## 2014-03-14 DIAGNOSIS — I4891 Unspecified atrial fibrillation: Secondary | ICD-10-CM

## 2014-03-14 DIAGNOSIS — I252 Old myocardial infarction: Secondary | ICD-10-CM | POA: Insufficient documentation

## 2014-03-14 DIAGNOSIS — Z79899 Other long term (current) drug therapy: Secondary | ICD-10-CM | POA: Insufficient documentation

## 2014-03-14 DIAGNOSIS — I447 Left bundle-branch block, unspecified: Secondary | ICD-10-CM

## 2014-03-14 DIAGNOSIS — I5022 Chronic systolic (congestive) heart failure: Secondary | ICD-10-CM

## 2014-03-14 DIAGNOSIS — I2789 Other specified pulmonary heart diseases: Secondary | ICD-10-CM | POA: Insufficient documentation

## 2014-03-14 DIAGNOSIS — I452 Bifascicular block: Secondary | ICD-10-CM | POA: Insufficient documentation

## 2014-03-14 MED ORDER — CHLORHEXIDINE GLUCONATE 4 % EX LIQD
CUTANEOUS | Status: AC
  Filled 2014-03-14: qty 15

## 2014-03-14 MED ORDER — LIDOCAINE HCL 1 % IJ SOLN
INTRAMUSCULAR | Status: AC
  Filled 2014-03-14: qty 20

## 2014-03-14 NOTE — Telephone Encounter (Signed)
[place] RN with [facility] called regarding patient, stated last night he pulled PICC line out 7 cm, went with him to ER where they redressed line and took CXR to confirm placement.  Per ED MD ok to run milrinone until PICC line exchange.  Set up patient as add on with IR to have line exchanged today.  [name] will advise patient to come to our clinic to be escourted to radiology for procedure today. Renee Pain

## 2014-03-16 ENCOUNTER — Inpatient Hospital Stay (HOSPITAL_COMMUNITY)
Admission: EM | Admit: 2014-03-16 | Discharge: 2014-04-05 | DRG: 871 | Disposition: A | Discharge status: Home Health | Payer: Medicare Other | Attending: Internal Medicine | Admitting: Internal Medicine

## 2014-03-16 ENCOUNTER — Inpatient Hospital Stay (HOSPITAL_COMMUNITY): Payer: Medicare Other

## 2014-03-16 ENCOUNTER — Encounter (HOSPITAL_COMMUNITY): Payer: Self-pay | Admitting: Emergency Medicine

## 2014-03-16 ENCOUNTER — Emergency Department (HOSPITAL_COMMUNITY): Payer: Medicare Other

## 2014-03-16 DIAGNOSIS — E780 Pure hypercholesterolemia: Secondary | ICD-10-CM | POA: Diagnosis present

## 2014-03-16 DIAGNOSIS — I509 Heart failure, unspecified: Secondary | ICD-10-CM

## 2014-03-16 DIAGNOSIS — I272 Other secondary pulmonary hypertension: Secondary | ICD-10-CM | POA: Diagnosis present

## 2014-03-16 DIAGNOSIS — Z7189 Other specified counseling: Secondary | ICD-10-CM

## 2014-03-16 DIAGNOSIS — I482 Chronic atrial fibrillation, unspecified: Secondary | ICD-10-CM

## 2014-03-16 DIAGNOSIS — A4101 Sepsis due to Methicillin susceptible Staphylococcus aureus: Secondary | ICD-10-CM | POA: Diagnosis present

## 2014-03-16 DIAGNOSIS — R112 Nausea with vomiting, unspecified: Secondary | ICD-10-CM | POA: Diagnosis present

## 2014-03-16 DIAGNOSIS — Z8673 Personal history of transient ischemic attack (TIA), and cerebral infarction without residual deficits: Secondary | ICD-10-CM | POA: Diagnosis not present

## 2014-03-16 DIAGNOSIS — I959 Hypotension, unspecified: Secondary | ICD-10-CM

## 2014-03-16 DIAGNOSIS — Z95810 Presence of automatic (implantable) cardiac defibrillator: Secondary | ICD-10-CM | POA: Diagnosis not present

## 2014-03-16 DIAGNOSIS — I452 Bifascicular block: Secondary | ICD-10-CM | POA: Diagnosis present

## 2014-03-16 DIAGNOSIS — I429 Cardiomyopathy, unspecified: Secondary | ICD-10-CM | POA: Diagnosis present

## 2014-03-16 DIAGNOSIS — R509 Fever, unspecified: Secondary | ICD-10-CM | POA: Diagnosis present

## 2014-03-16 DIAGNOSIS — J189 Pneumonia, unspecified organism: Secondary | ICD-10-CM | POA: Diagnosis present

## 2014-03-16 DIAGNOSIS — A419 Sepsis, unspecified organism: Secondary | ICD-10-CM

## 2014-03-16 DIAGNOSIS — R7881 Bacteremia: Secondary | ICD-10-CM

## 2014-03-16 DIAGNOSIS — R1900 Intra-abdominal and pelvic swelling, mass and lump, unspecified site: Secondary | ICD-10-CM

## 2014-03-16 DIAGNOSIS — D689 Coagulation defect, unspecified: Secondary | ICD-10-CM | POA: Diagnosis present

## 2014-03-16 DIAGNOSIS — J9601 Acute respiratory failure with hypoxia: Secondary | ICD-10-CM | POA: Diagnosis present

## 2014-03-16 DIAGNOSIS — I5022 Chronic systolic (congestive) heart failure: Secondary | ICD-10-CM

## 2014-03-16 DIAGNOSIS — Y95 Nosocomial condition: Secondary | ICD-10-CM | POA: Diagnosis present

## 2014-03-16 DIAGNOSIS — I4891 Unspecified atrial fibrillation: Secondary | ICD-10-CM | POA: Diagnosis present

## 2014-03-16 DIAGNOSIS — R6520 Severe sepsis without septic shock: Secondary | ICD-10-CM | POA: Diagnosis present

## 2014-03-16 DIAGNOSIS — Z7901 Long term (current) use of anticoagulants: Secondary | ICD-10-CM

## 2014-03-16 DIAGNOSIS — I2721 Secondary pulmonary arterial hypertension: Secondary | ICD-10-CM

## 2014-03-16 DIAGNOSIS — N183 Chronic kidney disease, stage 3 unspecified: Secondary | ICD-10-CM | POA: Diagnosis present

## 2014-03-16 DIAGNOSIS — E43 Unspecified severe protein-calorie malnutrition: Secondary | ICD-10-CM | POA: Diagnosis present

## 2014-03-16 DIAGNOSIS — E785 Hyperlipidemia, unspecified: Secondary | ICD-10-CM | POA: Diagnosis present

## 2014-03-16 DIAGNOSIS — T827XXA Infection and inflammatory reaction due to other cardiac and vascular devices, implants and grafts, initial encounter: Secondary | ICD-10-CM | POA: Diagnosis present

## 2014-03-16 DIAGNOSIS — I252 Old myocardial infarction: Secondary | ICD-10-CM | POA: Diagnosis not present

## 2014-03-16 DIAGNOSIS — J9811 Atelectasis: Secondary | ICD-10-CM | POA: Diagnosis present

## 2014-03-16 DIAGNOSIS — Z889 Allergy status to unspecified drugs, medicaments and biological substances status: Secondary | ICD-10-CM | POA: Diagnosis not present

## 2014-03-16 DIAGNOSIS — N179 Acute kidney failure, unspecified: Secondary | ICD-10-CM | POA: Diagnosis present

## 2014-03-16 DIAGNOSIS — J9600 Acute respiratory failure, unspecified whether with hypoxia or hypercapnia: Secondary | ICD-10-CM

## 2014-03-16 DIAGNOSIS — I480 Paroxysmal atrial fibrillation: Secondary | ICD-10-CM | POA: Diagnosis present

## 2014-03-16 DIAGNOSIS — Z23 Encounter for immunization: Secondary | ICD-10-CM | POA: Diagnosis not present

## 2014-03-16 DIAGNOSIS — I10 Essential (primary) hypertension: Secondary | ICD-10-CM | POA: Diagnosis present

## 2014-03-16 DIAGNOSIS — I129 Hypertensive chronic kidney disease with stage 1 through stage 4 chronic kidney disease, or unspecified chronic kidney disease: Secondary | ICD-10-CM | POA: Diagnosis present

## 2014-03-16 DIAGNOSIS — Y848 Other medical procedures as the cause of abnormal reaction of the patient, or of later complication, without mention of misadventure at the time of the procedure: Secondary | ICD-10-CM | POA: Diagnosis present

## 2014-03-16 DIAGNOSIS — I5023 Acute on chronic systolic (congestive) heart failure: Secondary | ICD-10-CM | POA: Diagnosis present

## 2014-03-16 DIAGNOSIS — I5021 Acute systolic (congestive) heart failure: Secondary | ICD-10-CM

## 2014-03-16 DIAGNOSIS — B9561 Methicillin susceptible Staphylococcus aureus infection as the cause of diseases classified elsewhere: Secondary | ICD-10-CM

## 2014-03-16 DIAGNOSIS — D649 Anemia, unspecified: Secondary | ICD-10-CM | POA: Diagnosis present

## 2014-03-16 DIAGNOSIS — B958 Unspecified staphylococcus as the cause of diseases classified elsewhere: Secondary | ICD-10-CM | POA: Diagnosis present

## 2014-03-16 DIAGNOSIS — Z66 Do not resuscitate: Secondary | ICD-10-CM | POA: Diagnosis present

## 2014-03-16 LAB — CBC WITH DIFFERENTIAL/PLATELET
Basophils Absolute: 0 10*3/uL (ref 0.0–0.1)
Basophils Relative: 0 % (ref 0–1)
Eosinophils Absolute: 0 10*3/uL (ref 0.0–0.7)
Eosinophils Relative: 0 % (ref 0–5)
HEMATOCRIT: 35.2 % — AB (ref 39.0–52.0)
Hemoglobin: 11.9 g/dL — ABNORMAL LOW (ref 13.0–17.0)
LYMPHS ABS: 0.3 10*3/uL — AB (ref 0.7–4.0)
Lymphocytes Relative: 1 % — ABNORMAL LOW (ref 12–46)
MCH: 31.8 pg (ref 26.0–34.0)
MCHC: 33.8 g/dL (ref 30.0–36.0)
MCV: 94.1 fL (ref 78.0–100.0)
MONO ABS: 1 10*3/uL (ref 0.1–1.0)
Monocytes Relative: 4 % (ref 3–12)
NEUTROS ABS: 24.4 10*3/uL — AB (ref 1.7–7.7)
Neutrophils Relative %: 95 % — ABNORMAL HIGH (ref 43–77)
Platelets: 281 10*3/uL (ref 150–400)
RBC: 3.74 MIL/uL — ABNORMAL LOW (ref 4.22–5.81)
RDW: 15.5 % (ref 11.5–15.5)
WBC Morphology: INCREASED
WBC: 25.7 10*3/uL — ABNORMAL HIGH (ref 4.0–10.5)

## 2014-03-16 LAB — COMPREHENSIVE METABOLIC PANEL
ALT: 18 U/L (ref 0–53)
ANION GAP: 14 (ref 5–15)
AST: 29 U/L (ref 0–37)
Albumin: 3.4 g/dL — ABNORMAL LOW (ref 3.5–5.2)
Alkaline Phosphatase: 107 U/L (ref 39–117)
BILIRUBIN TOTAL: 0.9 mg/dL (ref 0.3–1.2)
BUN: 27 mg/dL — AB (ref 6–23)
CHLORIDE: 98 meq/L (ref 96–112)
CO2: 29 mEq/L (ref 19–32)
CREATININE: 1.97 mg/dL — AB (ref 0.50–1.35)
Calcium: 9 mg/dL (ref 8.4–10.5)
GFR calc Af Amer: 35 mL/min — ABNORMAL LOW (ref >90)
GFR calc non Af Amer: 31 mL/min — ABNORMAL LOW (ref >90)
Glucose, Bld: 135 mg/dL — ABNORMAL HIGH (ref 70–99)
Potassium: 3.8 mEq/L (ref 3.7–5.3)
Sodium: 141 mEq/L (ref 137–147)
TOTAL PROTEIN: 7.3 g/dL (ref 6.0–8.3)

## 2014-03-16 LAB — URINALYSIS, ROUTINE W REFLEX MICROSCOPIC
Bilirubin Urine: NEGATIVE
Glucose, UA: NEGATIVE mg/dL
Hgb urine dipstick: NEGATIVE
Ketones, ur: NEGATIVE mg/dL
NITRITE: NEGATIVE
Protein, ur: 30 mg/dL — AB
Specific Gravity, Urine: 1.013 (ref 1.005–1.030)
UROBILINOGEN UA: 0.2 mg/dL (ref 0.0–1.0)
pH: 5 (ref 5.0–8.0)

## 2014-03-16 LAB — LIPASE, BLOOD: LIPASE: 11 U/L (ref 11–59)

## 2014-03-16 LAB — URINE MICROSCOPIC-ADD ON

## 2014-03-16 LAB — PROTIME-INR
INR: 3.55 — ABNORMAL HIGH (ref 0.00–1.49)
Prothrombin Time: 35.5 seconds — ABNORMAL HIGH (ref 11.6–15.2)

## 2014-03-16 LAB — I-STAT CG4 LACTIC ACID, ED: LACTIC ACID, VENOUS: 1.52 mmol/L (ref 0.5–2.2)

## 2014-03-16 MED ORDER — ACETAMINOPHEN 325 MG PO TABS
650.0000 mg | ORAL_TABLET | Freq: Once | ORAL | Status: AC
  Administered 2014-03-16: 650 mg via ORAL
  Filled 2014-03-16: qty 2

## 2014-03-16 MED ORDER — SODIUM CHLORIDE 0.9 % IV BOLUS (SEPSIS): 30.0000 mL/kg | Freq: Once | INTRAVENOUS | Status: DC

## 2014-03-16 MED ORDER — FUROSEMIDE 10 MG/ML IJ SOLN
80.0000 mg | Freq: Two times a day (BID) | INTRAMUSCULAR | Status: DC
  Administered 2014-03-16: 80 mg via INTRAVENOUS
  Filled 2014-03-16: qty 8

## 2014-03-16 MED ORDER — DEXTROSE 5 % IV SOLN
1.0000 g | INTRAVENOUS | Status: DC
  Administered 2014-03-17 – 2014-03-23 (×7): 1 g via INTRAVENOUS
  Filled 2014-03-16 (×8): qty 1

## 2014-03-16 MED ORDER — SODIUM CHLORIDE 0.9 % IV BOLUS (SEPSIS)
30.0000 mL/kg | Freq: Once | INTRAVENOUS | Status: AC
  Administered 2014-03-16: 1000 mL via INTRAVENOUS

## 2014-03-16 MED ORDER — VANCOMYCIN HCL IN DEXTROSE 1-5 GM/200ML-% IV SOLN
1000.0000 mg | INTRAVENOUS | Status: DC
  Administered 2014-03-17 – 2014-03-18 (×2): 1000 mg via INTRAVENOUS
  Filled 2014-03-16 (×3): qty 200

## 2014-03-16 MED ORDER — SODIUM CHLORIDE 0.9 % IV SOLN
1000.0000 mL | INTRAVENOUS | Status: DC
  Administered 2014-03-16: 1000 mL via INTRAVENOUS

## 2014-03-16 MED ORDER — DEXTROSE 5 % IV SOLN
1.0000 g | Freq: Once | INTRAVENOUS | Status: AC
  Administered 2014-03-16: 1 g via INTRAVENOUS
  Filled 2014-03-16: qty 1

## 2014-03-16 MED ORDER — ACETAMINOPHEN 650 MG RE SUPP: 650.0000 mg | Freq: Four times a day (QID) | RECTAL | Status: DC | PRN

## 2014-03-16 MED ORDER — SODIUM CHLORIDE 0.9 % IV SOLN: 1000.0000 mL | INTRAVENOUS | Status: DC

## 2014-03-16 MED ORDER — VANCOMYCIN HCL 10 G IV SOLR
1250.0000 mg | Freq: Once | INTRAVENOUS | Status: AC
  Administered 2014-03-16: 1250 mg via INTRAVENOUS
  Filled 2014-03-16: qty 1250

## 2014-03-16 MED ORDER — ACETAMINOPHEN 325 MG PO TABS
650.0000 mg | ORAL_TABLET | Freq: Four times a day (QID) | ORAL | Status: DC | PRN
  Administered 2014-03-16: 650 mg via ORAL
  Filled 2014-03-16: qty 2

## 2014-03-16 MED ORDER — WARFARIN - PHARMACIST DOSING INPATIENT
Freq: Every day | Status: DC
  Administered 2014-03-18 – 2014-04-03 (×11)
  Administered 2014-04-04: 1

## 2014-03-16 NOTE — ED Notes (Addendum)
LEVEL 1 sepsis called due to pt MAP <65

## 2014-03-16 NOTE — ED Notes (Signed)
I introduced myself to the patient.  He is comfortable and resting.

## 2014-03-16 NOTE — ED Notes (Signed)
Admitting MD at bedside.

## 2014-03-16 NOTE — ED Notes (Signed)
Second fluid bolus stopped.

## 2014-03-16 NOTE — ED Notes (Signed)
Dr. Wentz at bedside. 

## 2014-03-16 NOTE — ED Provider Notes (Signed)
CSN: [id number]     Arrival date & time 03/16/14  1539 History   First MD Initiated Contact with Patient 03/16/14 1600     Chief Complaint  Patient presents with  . Fever   Patient is a 80 y.o. male presenting with fever.  Fever Associated symptoms: chills, cough, nausea and vomiting   Associated symptoms: no chest pain, no diarrhea, no dysuria, no headaches and no rash     Patient is a 81 y.o. Male with PMH of CHF with EF of 20-25%, Pulmonary HTN, HTN, HL, and MI presents to the ED for 3 days of chills, decreased appetite, fevers, nausea and vomiting.  Patient states that over the past 3 days he has had increasing weakness, nausea, vomiting x2-3 times, and fever.  Per the patients wife the fever started today at 2 pm.  Patient does have a PICC line for milrinone infusion for his heart.  The PICC line was just replaced on 03/14/14 because it was accidentally withdrawn several cm by a home health nurse while doing an infusion.  Patient also admits to a productive cough with yellow sputum production and worsening shortness of breath for the past three days.  Patient does have a home health nurse who called EMS today given low blood pressures and O2 saturations in the 80's on room air.  Patient is not baseline on O2.  Patient is followed by Dr. Haroldine Laws for his heart failure.      Past Medical History  Diagnosis Date  . HTN (hypertension)   . High cholesterol   . CHF (congestive heart failure)   . BBBB (bilateral bundle branch block)   . LBBB (left bundle branch block)   . Weakness   . Tired   . Seizures 06/11/2012    new onset/notes (06/11/2012)  . SOB (shortness of breath)     "sometimes when I lay down;; related to not taking my RX" (06/11/2012)  . Myocardial infarction     06/10/2012  . Atrial fibrillation   . Stroke   . Atrial thrombus     left  . Pulmonary HTN    Past Surgical History  Procedure Laterality Date  . Throat surgery  1942    "and nose" (06/11/2012); ?T&A  .  Inguinal hernia repair  ~ 2008    "both sides" (06/11/2012)  . Tee without cardioversion  06/29/2012    Procedure: TRANSESOPHAGEAL ECHOCARDIOGRAM (TEE);  Surgeon: Jolaine Artist, MD;  Location: [facility] ENDOSCOPY;  Service: Cardiovascular;  Laterality: N/A;  will need a spanish interpreter Interpreter will be here at 1330-Hope spoke w/ Lawana  . Cardioversion N/A 08/14/2012    Procedure: CARDIOVERSION;  Surgeon: Jolaine Artist, MD;  Location: [facility] ENDOSCOPY;  Service: Cardiovascular;  Laterality: N/A;  . Tee without cardioversion N/A 11/12/2013    Procedure: TRANSESOPHAGEAL ECHOCARDIOGRAM (TEE);  Surgeon: Candee Furbish, MD;  Location: [facility] ENDOSCOPY;  Service: Cardiovascular;  Laterality: N/A;  . Tee without cardioversion N/A 11/19/2013    Procedure: TRANSESOPHAGEAL ECHOCARDIOGRAM (TEE);  Surgeon: Jolaine Artist, MD;  Location: [facility] ENDOSCOPY;  Service: Cardiovascular;  Laterality: N/A;  . Cardioversion N/A 12/27/2013    Procedure: CARDIOVERSION;  Surgeon: Jolaine Artist, MD;  Location: [facility] ENDOSCOPY;  Service: Cardiovascular;  Laterality: N/A;   No family history on file. History  Substance Use Topics  . Smoking status: Never Smoker   . Smokeless tobacco: Never Used  . Alcohol Use: No    Review of Systems  Constitutional: Positive for fever, chills, activity change,  appetite change and fatigue.  Respiratory: Positive for cough and shortness of breath. Negative for wheezing.   Cardiovascular: Negative for chest pain, palpitations and leg swelling.  Gastrointestinal: Positive for nausea, vomiting and abdominal pain. Negative for diarrhea, constipation and blood in stool.  Genitourinary: Negative for dysuria, urgency, frequency, hematuria and difficulty urinating.  Skin: Negative for rash.  Neurological: Positive for dizziness and weakness. Negative for headaches.  All other systems reviewed and are negative.     Allergies  Lisinopril  Home Medications   Prior to Admission  medications   Medication Sig Start Date End Date Taking? Authorizing Provider  amiodarone (PACERONE) 200 MG tablet Take 1 tablet (200 mg total) by mouth 2 (two) times daily. 11/22/13  Yes [name] D Clegg, NP  atorvastatin (LIPITOR) 20 MG tablet Take 1 tablet (20 mg total) by mouth daily at 6 PM. 03/07/14  Yes Rande Brunt, NP  furosemide (LASIX) 40 MG tablet Take 40 mg by mouth 3 (three) times daily.  03/11/14  Yes Historical Provider, MD  loratadine (CLARITIN) 10 MG tablet Take 10 mg by mouth daily.   Yes Historical Provider, MD  magnesium oxide (MAG-OX) 400 MG tablet Take 400 mg by mouth daily.   Yes Historical Provider, MD  metolazone (ZAROXOLYN) 2.5 MG tablet Take 2.5 mg by mouth daily.   Yes Historical Provider, MD  oxyCODONE 10 MG TABS Take 1 tablet (10 mg total) by mouth every 4 (four) hours as needed for severe pain. 03/07/14  Yes Rande Brunt, NP  potassium chloride SA (K-DUR,KLOR-CON) 20 MEQ tablet Take 1 tablet (20 mEq total) by mouth daily. 03/07/14  Yes Rande Brunt, NP  senna-docusate (SENOKOT-S) 8.6-50 MG per tablet Take 1 tablet by mouth at bedtime. 03/07/14  Yes Rande Brunt, NP  sildenafil (REVATIO) 20 MG tablet Take 1 tablet (20 mg total) by mouth 3 (three) times daily. 03/07/14  Yes Rande Brunt, NP  torsemide (DEMADEX) 20 MG tablet Take 1 tablet (20 mg total) by mouth daily. 03/07/14  Yes Rande Brunt, NP  traMADol (ULTRAM) 50 MG tablet Take 50 mg by mouth every 12 (twelve) hours as needed for moderate pain.   Yes Historical Provider, MD  warfarin (COUMADIN) 4 MG tablet Take 2-4 mg by mouth daily. 4mg  daily except Friday and Sunday takes 2mg    Yes Historical Provider, MD  milrinone (PRIMACOR) 20 MG/100ML SOLN infusion Inject 26.775 mcg/min into the vein continuous. 11/22/13   [name] D Clegg, NP   BP 95/45  Pulse 91  Temp(Src) 99.7 F (37.6 C) (Oral)  Resp 24  Wt 163 lb (73.936 kg)  SpO2 96% Physical Exam  Nursing note and vitals reviewed. Constitutional: He is oriented to  person, place, and time. He appears well-developed. He has a sickly appearance.  HENT:  Head: Normocephalic and atraumatic.  Mouth/Throat: Oropharynx is clear and moist. No oropharyngeal exudate.  Eyes: Conjunctivae and EOM are normal. Pupils are equal, round, and reactive to light. No scleral icterus.  Neck: Normal range of motion. Neck supple. No JVD present. No thyromegaly present.  Cardiovascular: Normal rate, regular rhythm, normal heart sounds and intact distal pulses.  Exam reveals no gallop and no friction rub.   No murmur heard. Pulmonary/Chest: Breath sounds normal. Tachypnea noted. No respiratory distress. He has no decreased breath sounds. He has no wheezes. He has no rhonchi. He has no rales. He exhibits no tenderness.  Dyssynchronus belly breathing  Abdominal: Soft. Bowel sounds are normal. He exhibits no  distension and no mass. There is no tenderness. There is no rebound and no guarding.  Musculoskeletal: Normal range of motion.  Lymphadenopathy:    He has no cervical adenopathy.  Neurological: He is alert and oriented to person, place, and time. No cranial nerve deficit. Coordination normal.  Skin: Skin is warm and dry.  PICC line in the right brachial artery.  There is some red macular erythema that is 3 cm in the diameter around the insertion of the PICC line that is warm to palpation.  Psychiatric: He has a normal mood and affect. His behavior is normal. Judgment and thought content normal.   5:10 pm Patient reexamined by me.  Tachycardia resolved, BP much improved.  Lungs continue to be clear to auscultation.   ED Course  Procedures   CRITICAL CARE Performed by: Starlyn Skeans A   Total critical care time:  75 minutes  Critical care time was exclusive of separately billable procedures and treating other patients.  Critical care was necessary to treat or prevent imminent or life-threatening deterioration.  Critical care was time spent personally by me on the  following activities: development of treatment plan with patient and/or surrogate as well as nursing, discussions with consultants, evaluation of patient's response to treatment, examination of patient, obtaining history from patient or surrogate, ordering and performing treatments and interventions, ordering and review of laboratory studies, ordering and review of radiographic studies, pulse oximetry and re-evaluation of patient's condition.  Labs Review Labs Reviewed  CBC WITH DIFFERENTIAL - Abnormal; Notable for the following:    WBC 25.7 (*)    RBC 3.74 (*)    Hemoglobin 11.9 (*)    HCT 35.2 (*)    Neutrophils Relative % 95 (*)    Lymphocytes Relative 1 (*)    Neutro Abs 24.4 (*)    Lymphs Abs 0.3 (*)    All other components within normal limits  COMPREHENSIVE METABOLIC PANEL - Abnormal; Notable for the following:    Glucose, Bld 135 (*)    BUN 27 (*)    Creatinine, Ser 1.97 (*)    Albumin 3.4 (*)    GFR calc non Af Amer 31 (*)    GFR calc Af Amer 35 (*)    All other components within normal limits  URINALYSIS, ROUTINE W REFLEX MICROSCOPIC - Abnormal; Notable for the following:    Color, Urine AMBER (*)    APPearance CLOUDY (*)    Protein, ur 30 (*)    Leukocytes, UA TRACE (*)    All other components within normal limits  CULTURE, BLOOD (ROUTINE X 2)  CULTURE, BLOOD (ROUTINE X 2)  URINE CULTURE  URINE MICROSCOPIC-ADD ON  I-STAT CG4 LACTIC ACID, ED    Imaging Review Dg Chest Port 1 View  03/16/2014   CLINICAL DATA:  Fever.  EXAM: PORTABLE CHEST - 1 VIEW  COMPARISON:  March 13, 2014.  FINDINGS: Stable cardiomegaly. Left-sided pacemaker is unchanged in position. Right-sided PICC line is noted with distal tip in the expected position the SVC. Mild central pulmonary vascular congestion and perihilar edema is noted. No pneumothorax or pleural effusion is noted. Bony thorax is intact.  IMPRESSION: Mild central pulmonary vascular congestion and mild perihilar edema is noted  bilaterally.   Electronically Signed   By: Sabino Dick M.D.   On: 03/16/2014 17:38     EKG Interpretation None      MDM   Final diagnoses:  Severe sepsis  Chronic congestive heart failure, unspecified congestive heart failure  type  Hypotension, unspecified hypotension type  Pulmonary hypertension   Patient is a 80 y.o. Male who presents to the ED with fever, tachycardia, tachypnea, and hypotension.  History reveals recent change of PICC line and physical exam reveals erythema around the PICC line site.  Suspect that this is severe sepsis secondary to bacteremia from PICC line.  Patient started on sepsis protocol and was given 2 L NS bolus.  UA is negative.  CXR reveals CHF patterns with no frank signs of infection.  CBC reveals leukocytosis of 25,000.  CMP reveals mild bump in SCr.  Lactic acid normal.  Blood cultures are pending.  Patient started on vanc and cefepime.  I spoke with Dr. Hal Hope at 7:29 pm who will admit the patient to Team 10 stepdown unit.  I have also consulted cardiology and spoke with Dr. Wynonia Lawman at 8:05 pm.  Cards will consult on this patient and will see the patient here in the ED.  Per Chart review the patient is a DNR.  Patient was seen alongside me with Dr. Eulis Foster who agrees with the above treatment and workup.  Cherylann Parr, PA-C 03/16/14 2213

## 2014-03-16 NOTE — ED Notes (Signed)
Pt. Called nurse into room. Interpreter phone used: patient reporting increased difficulty breathing, states he feels like he can't get enough air, wheezing noted. Pt. Also requesting that his cardiologist be called. Also reporting "a little" abdominal pain. Requesting blankets, informed patient that he could not have extra blankets at this time due to fever. PA notified on patient SOB.

## 2014-03-16 NOTE — Progress Notes (Signed)
ANTICOAGULATION CONSULT NOTE - Initial Consult  Pharmacy Consult for Coumadin Indication: atrial fibrillation  Allergies  Allergen Reactions  . Lisinopril Rash    Patient Measurements: Height: 5' 4.17" (163 cm) Weight: 163 lb (73.936 kg) IBW/kg (Calculated) : 59.6  Vital Signs: Temp: 104.1 F (40.1 C) (09/27 2130) Temp src: Rectal (09/27 2130) BP: 106/47 mmHg (09/27 2230) Pulse Rate: 109 (09/27 2230)  Labs:  Recent Labs  03/16/14 1615 03/16/14 2116  HGB 11.9*  --   HCT 35.2*  --   PLT 281  --   LABPROT  --  35.5*  INR  --  3.55*  CREATININE 1.97*  --     Estimated Creatinine Clearance: 28.1 ml/min (by C-G formula based on Cr of 1.97).   Medical History: Past Medical History  Diagnosis Date  . HTN (hypertension)   . High cholesterol   . CHF (congestive heart failure)   . BBBB (bilateral bundle branch block)   . LBBB (left bundle branch block)   . Seizures 06/11/2012    new onset/notes (06/11/2012)  . SOB (shortness of breath)     "sometimes when I lay down;; related to not taking my RX" (06/11/2012)  . Myocardial infarction     06/10/2012  . Atrial fibrillation   . Stroke   . Atrial thrombus     left  . Pulmonary HTN     Assessment: 80yo male presents w/ possible PNA, to continue Coumadin for Afib, current INR above goal.  Goal of Therapy:  INR 2-3   Plan:  Last Coumadin dose taken PTA 9/27; will hold Coumadin for now and monitor INR for adjustments.  Wynona Neat, PharmD, BCPS  03/16/2014,11:00 PM

## 2014-03-16 NOTE — ED Notes (Signed)
Pt. Had PICC line changed on the 24th. Surrounding area red and hard, patient denies pain. Pt. Has milrinone infusing into PICC line at this time. Per family, patient began running a fever today with SOB.

## 2014-03-16 NOTE — Consult Note (Signed)
Cardiology Consult Note  Admit date: 03/16/2014 Name: Tyler Christensen 80 y.o.  male DOB:  [birth year] MRN:  [id number]  Today's date:  03/16/2014  Referring Physician:    [facility]  Reason for Consultation:   Sepsis, end-stage heart failure  IMPRESSIONS: 1. Sepsis due to infected PICC line 2. Class IV systolic heart failure acute on chronic worsened with fluid resuscitation for sepsis 3. 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 Nonischemic cardiomyopathy 5. Prior history of embolic strokes 6. Functioning implantable defibrillator  RECOMMENDATION: 1. His blood pressure is better now but he is severely short of breath complaining of acute heart failure. He'll need to have his PICC line addressed and treat sepsis. Try intravenous diuresis if possible. On reviewing the record recently he is now a DO NOT RESUSCITATE and has a probable liposarcoma. Continue his other heart medicines and try to continue milrinone if we can get alternate source for its administration.  HISTORY: This 80 year old male has a history of a nonischemic cardiomyopathy and has significant class 3-4 heart failure. He was recently admitted to the hospital and was discharged on a milrinone infusion for severe heart failure. 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 He has known severe pulmonary hypertension and also has an implantable defibrillator. He had difficulty with the PICC line and had bleeding around the PICC line and had the PICC line manipulated in the emergency room a couple of occasions. He came to the ER today with fever, nausea and vomiting and was found to have severe sepsis with hypotension. He received about 2 L of fluid and is being admitted for treatment of sepsis. Cardiology was asked to see him because he is now having severe shortness of breath. History is obtained through an interpreter and is difficult.  Past Medical History   Diagnosis Date  . HTN (hypertension)   . High cholesterol   . CHF (congestive heart failure)   . BBBB (bilateral bundle branch block)   . LBBB (left bundle branch block)   . Weakness   . Tired   . Seizures 06/11/2012    new onset/notes (06/11/2012)  . SOB (shortness of breath)     "sometimes when I lay down;; related to not taking my RX" (06/11/2012)  . Myocardial infarction     06/10/2012  . Atrial fibrillation   . Stroke   . Atrial thrombus     left  . Pulmonary HTN       Past Surgical History  Procedure Laterality Date  . Throat surgery  1942    "and nose" (06/11/2012); ?T&A  . Inguinal hernia repair  ~ 2008    "both sides" (06/11/2012)  . Tee without cardioversion  06/29/2012    Procedure: TRANSESOPHAGEAL ECHOCARDIOGRAM (TEE);  Surgeon: Jolaine Artist, MD;  Location: [facility] ENDOSCOPY;  Service: Cardiovascular;  Laterality: N/A;  will need a spanish interpreter Interpreter will be here at 1330-Hope spoke w/ [place]  . Cardioversion N/A 08/14/2012    Procedure: CARDIOVERSION;  Surgeon: Jolaine Artist, MD;  Location: [facility] ENDOSCOPY;  Service: Cardiovascular;  Laterality: N/A;  . Tee without cardioversion N/A 11/12/2013    Procedure: TRANSESOPHAGEAL ECHOCARDIOGRAM (TEE);  Surgeon: Candee Furbish, MD;  Location: [facility] ENDOSCOPY;  Service: Cardiovascular;  Laterality: N/A;  . Tee without cardioversion N/A 11/19/2013    Procedure: TRANSESOPHAGEAL ECHOCARDIOGRAM (TEE);  Surgeon: Jolaine Artist, MD;  Location: [facility] ENDOSCOPY;  Service: Cardiovascular;  Laterality: N/A;  .  Cardioversion N/A 12/27/2013    Procedure: CARDIOVERSION;  Surgeon: Jolaine Artist, MD;  Location: [facility] ENDOSCOPY;  Service: Cardiovascular;  Laterality: N/A;     Allergies:  is allergic to lisinopril.   Medications: Prior to Admission medications   Medication Sig Start Date End Date Taking? Authorizing Provider  amiodarone (PACERONE) 200 MG tablet Take 1 tablet (200 mg total) by mouth 2 (two) times daily. 11/22/13   Yes [name] D Clegg, NP  atorvastatin (LIPITOR) 20 MG tablet Take 1 tablet (20 mg total) by mouth daily at 6 PM. 03/07/14  Yes Rande Brunt, NP  furosemide (LASIX) 40 MG tablet Take 40 mg by mouth 3 (three) times daily.  03/11/14  Yes Historical Provider, MD  loratadine (CLARITIN) 10 MG tablet Take 10 mg by mouth daily.   Yes Historical Provider, MD  magnesium oxide (MAG-OX) 400 MG tablet Take 400 mg by mouth daily.   Yes Historical Provider, MD  metolazone (ZAROXOLYN) 2.5 MG tablet Take 2.5 mg by mouth daily.   Yes Historical Provider, MD  oxyCODONE 10 MG TABS Take 1 tablet (10 mg total) by mouth every 4 (four) hours as needed for severe pain. 03/07/14  Yes Rande Brunt, NP  potassium chloride SA (K-DUR,KLOR-CON) 20 MEQ tablet Take 1 tablet (20 mEq total) by mouth daily. 03/07/14  Yes Rande Brunt, NP  senna-docusate (SENOKOT-S) 8.6-50 MG per tablet Take 1 tablet by mouth at bedtime. 03/07/14  Yes Rande Brunt, NP  sildenafil (REVATIO) 20 MG tablet Take 1 tablet (20 mg total) by mouth 3 (three) times daily. 03/07/14  Yes Rande Brunt, NP  torsemide (DEMADEX) 20 MG tablet Take 1 tablet (20 mg total) by mouth daily. 03/07/14  Yes Rande Brunt, NP  traMADol (ULTRAM) 50 MG tablet Take 50 mg by mouth every 12 (twelve) hours as needed for moderate pain.   Yes Historical Provider, MD  warfarin (COUMADIN) 4 MG tablet Take 2-4 mg by mouth daily. 4mg  daily except Friday and Sunday takes 2mg    Yes Historical Provider, MD  milrinone (PRIMACOR) 20 MG/100ML SOLN infusion Inject 26.775 mcg/min into the vein continuous. 11/22/13   [name] Estrella Deeds, NP    Family History: No family status information on file.    Social History:   reports that he has never smoked. He has never used smokeless tobacco. He reports that he does not drink alcohol or use illicit drugs.     Review of Systems: Obtained through interpreter, except as noted above otherwise unremarkable  Physical Exam: BP 95/45  Pulse 92  Temp(Src)  99.7 F (37.6 C) (Oral)  Resp 30  Wt 73.936 kg (163 lb)  SpO2 98%  General appearance: Spanish-speaking male sitting bolt upright in bed complaining of shortness of breath Head: Normocephalic, without obvious abnormality, atraumatic Eyes: conjunctivae/corneas clear. PERRL, EOM's intact. Fundi not examined  Neck: no adenopathy, no carotid bruit, supple, symmetrical, trachea midline and JVD noted Lungs: Mild rales and faint wheezing Heart: Regular rate and rhythm, 5-7/8 systolic murmur, S3 noted Abdomen: Mildly distended Rectal: deferred Extremities: 1+ edema, the PICC line site has erythema and the hardness in the right arm. Pulses: 2+ and symmetric Neurologic: Grossly normal  Labs: CBC  Recent Labs  03/16/14 1615  WBC 25.7*  RBC 3.74*  HGB 11.9*  HCT 35.2*  PLT 281  MCV 94.1  MCH 31.8  MCHC 33.8  RDW 15.5  LYMPHSABS 0.3*  MONOABS 1.0  EOSABS 0.0  BASOSABS 0.0   CMP  Recent Labs  03/16/14 1615  NA 141  K 3.8  CL 98  CO2 29  GLUCOSE 135*  BUN 27*  CREATININE 1.97*  CALCIUM 9.0  PROT 7.3  ALBUMIN 3.4*  AST 29  ALT 18  ALKPHOS 107  BILITOT 0.9  GFRNONAA 31*  GFRAA 35*   BNP (last 3 results)  Recent Labs  11/07/13 0708 03/01/14 1959 03/08/14 2230  PROBNP 9356.0* 11880.0* 15334.0*   Radiology: Central congestion as well as mild perihilar pulmonary edema  EKG: Not done on arrival  Signed:  W. Doristine Church MD [facility]   Cardiology Consultant  03/16/2014, 8:54 PM

## 2014-03-16 NOTE — Progress Notes (Addendum)
ANTIBIOTIC CONSULT NOTE - INITIAL  Pharmacy Consult for vancomycin, cefepime Indication: pneumonia  Allergies  Allergen Reactions  . Lisinopril Rash    Patient Measurements: Weight 73.5 kg on 03/13/14 Height 163 cm on 03/13/14 Vital Signs: Temp: 103.8 F (39.9 C) (09/27 1558) Temp src: Rectal (09/27 1558) BP: 107/49 mmHg (09/27 1549) Pulse Rate: 101 (09/27 1549) Labs: No results found for this basename: WBC, HGB, PLT, LABCREA, CREATININE,  in the last 72 hours The CrCl is unknown because both a height and weight (above a minimum accepted value) are required for this calculation. No results found for this basename: VANCOTROUGH, Corlis Leak, VANCORANDOM, GENTTROUGH, GENTPEAK, GENTRANDOM, TOBRATROUGH, TOBRAPEAK, TOBRARND, AMIKACINPEAK, AMIKACINTROU, AMIKACIN,  in the last 72 hours   Microbiology: Recent Results (from the past 720 hour(s))  MRSA PCR SCREENING     Status: None   Collection Time    03/02/14  2:00 AM      Result Value Ref Range Status   MRSA by PCR NEGATIVE  NEGATIVE Final   Comment:            The GeneXpert MRSA Assay (FDA     approved for NASAL specimens     only), is one component of a     comprehensive MRSA colonization     surveillance program. It is not     intended to diagnose MRSA     infection nor to guide or     monitor treatment for     MRSA infections.  CLOSTRIDIUM DIFFICILE BY PCR     Status: None   Collection Time    03/03/14 10:27 PM      Result Value Ref Range Status   C difficile by pcr NEGATIVE  NEGATIVE Final    Medical History: Past Medical History  Diagnosis Date  . HTN (hypertension)   . High cholesterol   . CHF (congestive heart failure)   . BBBB (bilateral bundle branch block)   . LBBB (left bundle branch block)   . Weakness   . Tired   . Seizures 06/11/2012    new onset/notes (06/11/2012)  . SOB (shortness of breath)     "sometimes when I lay down;; related to not taking my RX" (06/11/2012)  . Myocardial infarction    06/10/2012  . Atrial fibrillation   . Stroke   . Atrial thrombus     left  . Pulmonary HTN    Medications:  Anti-infectives   None     Assessment: 17 YOM in ED with fever, 88% pulse ox on RA and s/p recent PICC line change on 9/24 that infuses milrinone to start vancomycin and cefepime for r/o pneumonia. Labs pending. Cultures pending.   Goal of Therapy:  Vancomycin trough level 15-20 mcg/ml  Plan:  1. Cefepime 1g IV now.  -1st dose has been pulled from pyxis. Called to instruct RN to chart and give.  2. Vancomycin 1250mg  IV x1. - 1st dose given.  3. Follow-up labs for further dosing.   Sloan Leiter, PharmD, BCPS Clinical Pharmacist [phone] 03/16/2014,4:03 PM  Addendum: SCr 1.97 for estimated CrCl ~30 -35 mL/min.  Plan: Schedule Cefepime 1g IV q24h.  Schedule Vancomycin 1g IV q24h.  5:19 PM, 03/16/2014

## 2014-03-16 NOTE — H&P (Signed)
[facility] History and Physical  Tyler Christensen JJK:[id number] DOB: 21-Nov-1932 DOA: 03/16/2014  Referring physician: ER physician. PCP: Harvie Junior, MD   Used Spanish translator as patient does not speak [place].  Chief Complaint: Fever.  HPI: Tyler Christensen is a 80 y.o. 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 Patient has been having fever last 3 days with increasing shortness of breath and nausea vomiting. Patient states that he's unable to take anything because of nausea vomiting. Patient states his abdominal discomfort also has increased from prior with increasing abdominal distention. In the ER patient was found to be febrile with temperatures around 103F and patient was initially mild hypotensive and was given 2 L normal saline bolus following which patient pressure improved. Chest x-ray shows congestive pattern and UA was unremarkable. Patient on exam has erythema around his right-sided upper extremity PICC line. PICC line was placed in September and patient states that since it is erythematous. Patient abdomen is distended but nontender. Patient states he has been vomiting for last 3 days and he has had a bowel movement today which was not loose. His abdominal pain is mostly epigastric area. Denies any chest pain. Denies any headache or focal deficits.  Review of Systems: As presented in the history of presenting illness, rest negative.  Past Medical History  Diagnosis Date  . HTN (hypertension)   . High cholesterol   . CHF (congestive heart failure)   . BBBB (bilateral bundle branch block)   . LBBB (left bundle branch block)   . Weakness   . Tired   . Seizures 06/11/2012    new onset/notes (06/11/2012)  . SOB (shortness of breath)     "sometimes when I lay down;;  related to not taking my RX" (06/11/2012)  . Myocardial infarction     06/10/2012  . Atrial fibrillation   . Stroke   . Atrial thrombus     left  . Pulmonary HTN    Past Surgical History  Procedure Laterality Date  . Throat surgery  1942    "and nose" (06/11/2012); ?T&A  . Inguinal hernia repair  ~ 2008    "both sides" (06/11/2012)  . Tee without cardioversion  06/29/2012    Procedure: TRANSESOPHAGEAL ECHOCARDIOGRAM (TEE);  Surgeon: Jolaine Artist, MD;  Location: [facility] ENDOSCOPY;  Service: Cardiovascular;  Laterality: N/A;  will need a spanish interpreter Interpreter will be here at 1330-Hope spoke w/ [place]  . Cardioversion N/A 08/14/2012    Procedure: CARDIOVERSION;  Surgeon: Jolaine Artist, MD;  Location: [facility] ENDOSCOPY;  Service: Cardiovascular;  Laterality: N/A;  . Tee without cardioversion N/A 11/12/2013    Procedure: TRANSESOPHAGEAL ECHOCARDIOGRAM (TEE);  Surgeon: Candee Furbish, MD;  Location: [facility] ENDOSCOPY;  Service: Cardiovascular;  Laterality: N/A;  . Tee without cardioversion N/A 11/19/2013    Procedure: TRANSESOPHAGEAL ECHOCARDIOGRAM (TEE);  Surgeon: Jolaine Artist, MD;  Location: [facility] ENDOSCOPY;  Service: Cardiovascular;  Laterality: N/A;  . Cardioversion N/A 12/27/2013    Procedure: CARDIOVERSION;  Surgeon: Jolaine Artist, MD;  Location: [facility] ENDOSCOPY;  Service: Cardiovascular;  Laterality: N/A;   Social History:  reports that he has never smoked. He has never used smokeless tobacco. He reports that he does not drink alcohol or use illicit drugs. Where does patient live home. Can patient participate in ADLs? Yes.  Allergies  Allergen  Reactions  . Lisinopril Rash    Family History: History reviewed. No pertinent family history.    Prior to Admission medications   Medication Sig Start Date End Date Taking? Authorizing Provider  amiodarone (PACERONE) 200 MG tablet Take 1 tablet (200 mg total) by mouth 2 (two) times daily. 11/22/13  Yes [name] D Clegg, NP  atorvastatin  (LIPITOR) 20 MG tablet Take 1 tablet (20 mg total) by mouth daily at 6 PM. 03/07/14  Yes Rande Brunt, NP  furosemide (LASIX) 40 MG tablet Take 40 mg by mouth 3 (three) times daily.  03/11/14  Yes Historical Provider, MD  loratadine (CLARITIN) 10 MG tablet Take 10 mg by mouth daily.   Yes Historical Provider, MD  magnesium oxide (MAG-OX) 400 MG tablet Take 400 mg by mouth daily.   Yes Historical Provider, MD  metolazone (ZAROXOLYN) 2.5 MG tablet Take 2.5 mg by mouth daily.   Yes Historical Provider, MD  oxyCODONE 10 MG TABS Take 1 tablet (10 mg total) by mouth every 4 (four) hours as needed for severe pain. 03/07/14  Yes Rande Brunt, NP  potassium chloride SA (K-DUR,KLOR-CON) 20 MEQ tablet Take 1 tablet (20 mEq total) by mouth daily. 03/07/14  Yes Rande Brunt, NP  senna-docusate (SENOKOT-S) 8.6-50 MG per tablet Take 1 tablet by mouth at bedtime. 03/07/14  Yes Rande Brunt, NP  sildenafil (REVATIO) 20 MG tablet Take 1 tablet (20 mg total) by mouth 3 (three) times daily. 03/07/14  Yes Rande Brunt, NP  torsemide (DEMADEX) 20 MG tablet Take 1 tablet (20 mg total) by mouth daily. 03/07/14  Yes Rande Brunt, NP  traMADol (ULTRAM) 50 MG tablet Take 50 mg by mouth every 12 (twelve) hours as needed for moderate pain.   Yes Historical Provider, MD  warfarin (COUMADIN) 4 MG tablet Take 2-4 mg by mouth daily. 4mg  daily except Friday and Sunday takes 2mg    Yes Historical Provider, MD  milrinone (PRIMACOR) 20 MG/100ML SOLN infusion Inject 26.775 mcg/min into the vein continuous. 11/22/13   Conrad [place], NP    Physical Exam: Filed Vitals:   03/16/14 1930 03/16/14 1945 03/16/14 2000 03/16/14 2015  BP: 99/49 111/73 95/49 95/45   Pulse: 92 94 96 92  Temp:      TempSrc:      Resp: 24 32 34 30  Weight:      SpO2: 96% 97% 97% 98%     General:  Well built and moderately nourished.  Eyes: Anicteric no pallor.  ENT: No discharge from the ears eyes nose mouth.  Neck: Elevated JVD. No mass  felt.  Cardiovascular: S1-S2 heard.  Respiratory: Mild expiratory wheeze heard no crepitations.  Abdomen: Distended nontender bowel sounds present.  Skin: Rash around the PICC line the right upper extremity. No active discharge on the PICC line.  Musculoskeletal: No edema.  Psychiatric: Appears normal.  Neurologic: Alert awake oriented to time place and person. Moves all extremities.  Labs on Admission:  Basic Metabolic Panel:  Recent Labs Lab 03/16/14 1615  NA 141  K 3.8  CL 98  CO2 29  GLUCOSE 135*  BUN 27*  CREATININE 1.97*  CALCIUM 9.0   Liver Function Tests:  Recent Labs Lab 03/16/14 1615  AST 29  ALT 18  ALKPHOS 107  BILITOT 0.9  PROT 7.3  ALBUMIN 3.4*   No results found for this basename: LIPASE, AMYLASE,  in the last 168 hours No results found for this basename: AMMONIA,  in the  last 168 hours CBC:  Recent Labs Lab 03/16/14 1615  WBC 25.7*  NEUTROABS 24.4*  HGB 11.9*  HCT 35.2*  MCV 94.1  PLT 281   Cardiac Enzymes: No results found for this basename: CKTOTAL, CKMB, CKMBINDEX, TROPONINI,  in the last 168 hours  BNP (last 3 results)  Recent Labs  11/07/13 0708 03/01/14 1959 03/08/14 2230  PROBNP 9356.0* 11880.0* 15334.0*   CBG: No results found for this basename: GLUCAP,  in the last 168 hours  Radiological Exams on Admission: Dg Chest Port 1 View  03/16/2014   CLINICAL DATA:  Fever.  EXAM: PORTABLE CHEST - 1 VIEW  COMPARISON:  March 13, 2014.  FINDINGS: Stable cardiomegaly. Left-sided pacemaker is unchanged in position. Right-sided PICC line is noted with distal tip in the expected position the SVC. Mild central pulmonary vascular congestion and perihilar edema is noted. No pneumothorax or pleural effusion is noted. Bony thorax is intact.  IMPRESSION: Mild central pulmonary vascular congestion and mild perihilar edema is noted bilaterally.   Electronically Signed   By: Sabino Dick M.D.   On: 03/16/2014 17:38      Assessment/Plan Principal Problem:   Sepsis Active Problems:   Acute on chronic systolic heart failure   Acute respiratory failure with hypoxia   Nausea & vomiting   1. Sepsis - most likely source could be PICC line given the erythema around the PICC line site. Patient is also having some nausea vomiting with abdominal distention for which KUB and CT abdomen and pelvis with by mouth contrast has been ordered. Follow blood cultures. Patient's PICC line has been removed and new one placed for which I have discussed with critical care. Continue with vancomycin and cefepime empirically. 2. Acute respiratory failure with hypoxia secondary to CHF last EF measured was 20% and patient on milrinone - cardiology has been consulted. If patient's blood pressure improves and remains stable then may consider Lasix/diuretics. Patient may need BiPAP if patient remains short of breath. Closely observe in step down. 3. Nausea vomiting with history of retropectoral mass concerning for liposarcoma - patient was felt not a candidate for surgery or any further intervention given the patient's multiple comorbidities. At this time since patient has been having recurrent nausea vomiting I have ordered KUB and CT abdomen and pelvis to check for any obvious obstruction or worsening of the mass. Patient will be kept until then on clear liquid diet. 4. Atrial fibrillation on Coumadin - INR is pending. Coumadin per pharmacy. Continue amiodarone. 5. Chronic kidney disease stage III - patient was received for acute renal failure. At that time patient's diuretics were withheld and restarted. Closely follow intake output and metabolic panel. 6. Chronic anemia - follow CBC.  Discussed with pulmonary critical care about patient's admission.  Code Status: Full code. This was confirmed during this admission.  Family Communication: None.  Disposition Plan: Admit to inpatient.    KAKRAKANDY,ARSHAD N. [facility] Pager [phone].  If 7PM-7AM, please contact night-coverage [email or web address] Password [facility] 03/16/2014, 8:58 PM

## 2014-03-16 NOTE — ED Notes (Addendum)
Second fluid bolus started per PA due to BP dropping after first fluid bolus stopped -- fluid part of the initial fluid order per sepsis protocol. Will reassess after this bolus to decide if level 1 code sepsis needs to be called.

## 2014-03-16 NOTE — ED Notes (Signed)
Per EMS, patient home health nurse states patient had a fever of 100.9 with a low pulse ox 88% on RA. Placed on 4L [place] and pulse ox 96-97%. Pt. Had PICC line changed on the 24th, infusing milrinone at this time. Hx of peritoneal cancer. BP low as well, initially 90/60, now 101/52, per home health nurse low 701I systolic is baseline for patient

## 2014-03-16 NOTE — ED Notes (Signed)
Dr. Wynonia Lawman, cardiologist at bedside

## 2014-03-16 NOTE — ED Notes (Signed)
Pt monitored by pulse ox, 12-lead, and bp cuff.

## 2014-03-17 ENCOUNTER — Inpatient Hospital Stay (HOSPITAL_COMMUNITY): Payer: Medicare Other

## 2014-03-17 DIAGNOSIS — A419 Sepsis, unspecified organism: Secondary | ICD-10-CM

## 2014-03-17 DIAGNOSIS — N183 Chronic kidney disease, stage 3 unspecified: Secondary | ICD-10-CM

## 2014-03-17 DIAGNOSIS — I2789 Other specified pulmonary heart diseases: Secondary | ICD-10-CM

## 2014-03-17 DIAGNOSIS — I5023 Acute on chronic systolic (congestive) heart failure: Secondary | ICD-10-CM

## 2014-03-17 DIAGNOSIS — R6520 Severe sepsis without septic shock: Secondary | ICD-10-CM

## 2014-03-17 DIAGNOSIS — Z7901 Long term (current) use of anticoagulants: Secondary | ICD-10-CM

## 2014-03-17 LAB — GLUCOSE, CAPILLARY
GLUCOSE-CAPILLARY: 128 mg/dL — AB (ref 70–99)
GLUCOSE-CAPILLARY: 100 mg/dL — AB (ref 70–99)
Glucose-Capillary: 185 mg/dL — ABNORMAL HIGH (ref 70–99)
Glucose-Capillary: 190 mg/dL — ABNORMAL HIGH (ref 70–99)

## 2014-03-17 LAB — COMPREHENSIVE METABOLIC PANEL
ALT: 31 U/L (ref 0–53)
ANION GAP: 14 (ref 5–15)
AST: 51 U/L — ABNORMAL HIGH (ref 0–37)
Albumin: 2.8 g/dL — ABNORMAL LOW (ref 3.5–5.2)
Alkaline Phosphatase: 106 U/L (ref 39–117)
BILIRUBIN TOTAL: 0.7 mg/dL (ref 0.3–1.2)
BUN: 30 mg/dL — AB (ref 6–23)
CO2: 25 mEq/L (ref 19–32)
Calcium: 8 mg/dL — ABNORMAL LOW (ref 8.4–10.5)
Chloride: 102 mEq/L (ref 96–112)
Creatinine, Ser: 2.19 mg/dL — ABNORMAL HIGH (ref 0.50–1.35)
GFR calc Af Amer: 31 mL/min — ABNORMAL LOW (ref >90)
GFR calc non Af Amer: 27 mL/min — ABNORMAL LOW (ref >90)
GLUCOSE: 141 mg/dL — AB (ref 70–99)
Potassium: 3.7 mEq/L (ref 3.7–5.3)
Sodium: 141 mEq/L (ref 137–147)
TOTAL PROTEIN: 6.2 g/dL (ref 6.0–8.3)

## 2014-03-17 LAB — CBC WITH DIFFERENTIAL/PLATELET
BASOS ABS: 0 10*3/uL (ref 0.0–0.1)
BASOS PCT: 0 % (ref 0–1)
Eosinophils Absolute: 0 10*3/uL (ref 0.0–0.7)
Eosinophils Relative: 0 % (ref 0–5)
HCT: 31.5 % — ABNORMAL LOW (ref 39.0–52.0)
Hemoglobin: 10 g/dL — ABNORMAL LOW (ref 13.0–17.0)
Lymphocytes Relative: 2 % — ABNORMAL LOW (ref 12–46)
Lymphs Abs: 0.4 10*3/uL — ABNORMAL LOW (ref 0.7–4.0)
MCH: 30.2 pg (ref 26.0–34.0)
MCHC: 31.7 g/dL (ref 30.0–36.0)
MCV: 95.2 fL (ref 78.0–100.0)
MONO ABS: 1.4 10*3/uL — AB (ref 0.1–1.0)
Monocytes Relative: 5 % (ref 3–12)
NEUTROS ABS: 23.8 10*3/uL — AB (ref 1.7–7.7)
NEUTROS PCT: 93 % — AB (ref 43–77)
Platelets: 235 10*3/uL (ref 150–400)
RBC: 3.31 MIL/uL — ABNORMAL LOW (ref 4.22–5.81)
RDW: 15.6 % — AB (ref 11.5–15.5)
WBC: 25.6 10*3/uL — ABNORMAL HIGH (ref 4.0–10.5)

## 2014-03-17 LAB — MRSA PCR SCREENING: MRSA BY PCR: NEGATIVE

## 2014-03-17 LAB — PROTIME-INR
INR: 4.32 — ABNORMAL HIGH (ref 0.00–1.49)
PROTHROMBIN TIME: 41.4 s — AB (ref 11.6–15.2)

## 2014-03-17 MED ORDER — INFLUENZA VAC SPLIT QUAD 0.5 ML IM SUSY: 0.5000 mL | PREFILLED_SYRINGE | INTRAMUSCULAR | Status: DC | PRN

## 2014-03-17 MED ORDER — METOLAZONE 2.5 MG PO TABS
2.5000 mg | ORAL_TABLET | Freq: Every day | ORAL | Status: DC
Start: 2014-03-17 — End: 2014-03-17
  Filled 2014-03-17: qty 1

## 2014-03-17 MED ORDER — OXYCODONE HCL 5 MG PO TABS
10.0000 mg | ORAL_TABLET | ORAL | Status: DC | PRN
  Filled 2014-03-17 (×2): qty 2

## 2014-03-17 MED ORDER — PNEUMOCOCCAL VAC POLYVALENT 25 MCG/0.5ML IJ INJ: 0.5000 mL | INJECTION | INTRAMUSCULAR | Status: DC | PRN

## 2014-03-17 MED ORDER — SODIUM CHLORIDE 0.9 % IJ SOLN
3.0000 mL | Freq: Two times a day (BID) | INTRAMUSCULAR | Status: DC
  Administered 2014-03-17 – 2014-04-04 (×16): 3 mL via INTRAVENOUS

## 2014-03-17 MED ORDER — LEVALBUTEROL HCL 0.63 MG/3ML IN NEBU: 0.6300 mg | INHALATION_SOLUTION | Freq: Four times a day (QID) | RESPIRATORY_TRACT | Status: DC | PRN

## 2014-03-17 MED ORDER — SILDENAFIL CITRATE 20 MG PO TABS
20.0000 mg | ORAL_TABLET | Freq: Three times a day (TID) | ORAL | Status: DC
  Administered 2014-03-17 – 2014-04-05 (×58): 20 mg via ORAL
  Filled 2014-03-17 (×62): qty 1

## 2014-03-17 MED ORDER — LORATADINE 10 MG PO TABS
10.0000 mg | ORAL_TABLET | Freq: Every day | ORAL | Status: DC
  Administered 2014-03-17 – 2014-04-05 (×20): 10 mg via ORAL
  Filled 2014-03-17 (×20): qty 1

## 2014-03-17 MED ORDER — OXYCODONE HCL 5 MG PO TABS
10.0000 mg | ORAL_TABLET | Freq: Four times a day (QID) | ORAL | Status: DC | PRN
  Administered 2014-03-17 – 2014-04-04 (×13): 10 mg via ORAL
  Filled 2014-03-17 (×29): qty 2

## 2014-03-17 MED ORDER — POTASSIUM CHLORIDE CRYS ER 20 MEQ PO TBCR
20.0000 meq | EXTENDED_RELEASE_TABLET | Freq: Every day | ORAL | Status: DC
  Administered 2014-03-17 – 2014-03-23 (×7): 20 meq via ORAL
  Filled 2014-03-17 (×8): qty 1

## 2014-03-17 MED ORDER — ONDANSETRON HCL 4 MG/2ML IJ SOLN
4.0000 mg | Freq: Four times a day (QID) | INTRAMUSCULAR | Status: DC | PRN
  Administered 2014-03-17: 4 mg via INTRAVENOUS
  Filled 2014-03-17: qty 2

## 2014-03-17 MED ORDER — TRAMADOL HCL 50 MG PO TABS
50.0000 mg | ORAL_TABLET | Freq: Two times a day (BID) | ORAL | Status: DC | PRN
  Administered 2014-03-29 – 2014-04-03 (×3): 50 mg via ORAL
  Filled 2014-03-17 (×3): qty 1

## 2014-03-17 MED ORDER — ONDANSETRON HCL 4 MG PO TABS: 4.0000 mg | ORAL_TABLET | Freq: Four times a day (QID) | ORAL | Status: DC | PRN

## 2014-03-17 MED ORDER — PHYTONADIONE 5 MG PO TABS
2.5000 mg | ORAL_TABLET | Freq: Once | ORAL | Status: AC
Start: 2014-03-17 — End: 2014-03-17
  Administered 2014-03-17: 2.5 mg via ORAL
  Filled 2014-03-17: qty 1

## 2014-03-17 MED ORDER — TORSEMIDE 20 MG PO TABS
20.0000 mg | ORAL_TABLET | Freq: Every day | ORAL | Status: DC
  Filled 2014-03-17: qty 1

## 2014-03-17 MED ORDER — MAGNESIUM OXIDE 400 (241.3 MG) MG PO TABS
400.0000 mg | ORAL_TABLET | Freq: Every day | ORAL | Status: DC
  Administered 2014-03-17 – 2014-04-05 (×20): 400 mg via ORAL
  Filled 2014-03-17 (×20): qty 1

## 2014-03-17 MED ORDER — BUDESONIDE 0.25 MG/2ML IN SUSP
0.2500 mg | Freq: Two times a day (BID) | RESPIRATORY_TRACT | Status: DC
  Administered 2014-03-17 – 2014-04-05 (×36): 0.25 mg via RESPIRATORY_TRACT
  Filled 2014-03-17 (×45): qty 2

## 2014-03-17 MED ORDER — ENSURE COMPLETE PO LIQD
237.0000 mL | Freq: Three times a day (TID) | ORAL | Status: DC
  Administered 2014-03-17 – 2014-04-01 (×36): 237 mL via ORAL

## 2014-03-17 MED ORDER — MILRINONE IN DEXTROSE 20 MG/100ML IV SOLN
0.3750 ug/kg/min | INTRAVENOUS | Status: DC
  Administered 2014-03-17 – 2014-04-05 (×35): 0.375 ug/kg/min via INTRAVENOUS
  Filled 2014-03-17 (×36): qty 100

## 2014-03-17 MED ORDER — AMIODARONE HCL 200 MG PO TABS
200.0000 mg | ORAL_TABLET | Freq: Two times a day (BID) | ORAL | Status: DC
  Administered 2014-03-17 – 2014-04-05 (×39): 200 mg via ORAL
  Filled 2014-03-17 (×40): qty 1

## 2014-03-17 MED ORDER — ATORVASTATIN CALCIUM 20 MG PO TABS
20.0000 mg | ORAL_TABLET | Freq: Every day | ORAL | Status: DC
  Administered 2014-03-17 – 2014-04-04 (×19): 20 mg via ORAL
  Filled 2014-03-17 (×20): qty 1

## 2014-03-17 MED ORDER — LEVALBUTEROL HCL 0.63 MG/3ML IN NEBU
0.6300 mg | INHALATION_SOLUTION | Freq: Four times a day (QID) | RESPIRATORY_TRACT | Status: DC
  Administered 2014-03-17 – 2014-03-22 (×22): 0.63 mg via RESPIRATORY_TRACT
  Filled 2014-03-17 (×31): qty 3

## 2014-03-17 MED ORDER — IOHEXOL 300 MG/ML  SOLN
25.0000 mL | INTRAMUSCULAR | Status: AC
  Administered 2014-03-17: 25 mL via ORAL

## 2014-03-17 NOTE — ED Provider Notes (Signed)
  Face-to-face evaluation   History: Patient presents for evaluation of fever, cough, sputum production, and weakness, for several days. He lives at home. He is on chronic milrinone drip.  Physical exam: Pale, elderly man, who is uncomfortable. Lungs have good air movement posteriorly. Abdomen is somewhat distended. It is soft and nontender to palpation. He is tachycardic and tachypneic. Right arm PICC line site has surrounding erythema and induration consistent with a localized infection.  Medical screening examination/treatment/procedure(s) were conducted as a shared visit with non-physician practitioner(s) and myself.  I personally evaluated the patient during the encounter  Richarda Blade, MD 03/19/14 [phone]

## 2014-03-17 NOTE — Progress Notes (Signed)
INITIAL NUTRITION ASSESSMENT  DOCUMENTATION CODES Per approved criteria  -Not Applicable   INTERVENTION: Ensure Complete po TID, each supplement provides 350 kcal and 13 grams of protein  NUTRITION DIAGNOSIS: Inadequate oral intake related to heart failure as evidenced by reported intake less than estimated needs.   Goal: Pt to meet >/= 90% of their estimated nutrition needs   Monitor:  Weight trend, po intake, acceptance of supplements, labs  Reason for Assessment: MST  80 y.o. male  Admitting Dx: Sepsis  ASSESSMENT: 80 y.o. 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 Pt complains that he "feels full" even after drinking water. He has lost 3 lbs recently due to not eating for the past three days. Current diet is clear liquids, per MD note, diet will be advanced to full liquid. Pt denies any nausea or vomiting.  - Pt with moderate wasting of temples.   Labs: CBGs: 128-186 lbs BUN elevated Na and K WNL  Height: Ht Readings from Last 1 Encounters:  03/17/14 5\' 8"  (1.727 m)    Weight: Wt Readings from Last 1 Encounters:  03/17/14 166 lb 0.1 oz (75.3 kg)    Ideal Body Weight: 68.4 kg  % Ideal Body Weight: 110%  Wt Readings from Last 10 Encounters:  03/17/14 166 lb 0.1 oz (75.3 kg)  03/13/14 162 lb (73.483 kg)  03/08/14 167 lb 9 oz (76.006 kg)  03/07/14 160 lb 11.5 oz (72.9 kg)  02/26/14 164 lb 12.8 oz (74.753 kg)  02/14/14 164 lb (74.39 kg)  01/15/14 165 lb (74.844 kg)  12/26/13 165 lb (74.844 kg)  12/12/13 161 lb (73.029 kg)  11/28/13 160 lb (72.576 kg)    Usual Body Weight: 169 lbs  % Usual Body Weight: 98%  BMI:  Body mass index is 25.25 kg/(m^2).  Estimated Nutritional Needs: Kcal:  1850-2100 Protein: 90-100 g Fluid: 1.9-2.1 L/day  Skin: intact  Diet Order: Full Liquid  EDUCATION NEEDS: -Education needs addressed   Intake/Output Summary (Last 24 hours) at 03/17/14 1054 Last data filed at 03/17/14 0808  Gross per 24 hour  Intake   2503 ml  Output    300 ml  Net   2203 ml    Last BM: 9/28   Labs:   Recent Labs Lab 03/16/14 1615 03/17/14 0442  NA 141 141  K 3.8 3.7  CL 98 102  CO2 29 25  BUN 27* 30*  CREATININE 1.97* 2.19*  CALCIUM 9.0 8.0*  GLUCOSE 135* 141*    CBG (last 3)   Recent Labs  03/17/14 0638  GLUCAP 128*    Scheduled Meds: . amiodarone  200 mg Oral BID  . atorvastatin  20 mg Oral q1800  . budesonide (PULMICORT) nebulizer solution  0.25 mg Nebulization BID  . ceFEPime (MAXIPIME) IV  1 g Intravenous Q24H  . levalbuterol  0.63 mg Nebulization Q6H  . loratadine  10 mg Oral Daily  . magnesium oxide  400 mg Oral Daily  . phytonadione  2.5 mg Oral Once  . potassium chloride SA  20 mEq Oral Daily  . sildenafil  20 mg Oral TID  . sodium chloride  3 mL Intravenous Q12H  . vancomycin  1,000 mg Intravenous Q24H  . Warfarin - Pharmacist Dosing Inpatient  Does not apply q1800    Continuous Infusions: . milrinone 0.375 mcg/kg/min (03/17/14 4536)    Past Medical History  Diagnosis Date  . HTN (hypertension)   . High cholesterol   . CHF (congestive heart failure)   . BBBB (bilateral bundle branch block)   . LBBB (left bundle branch block)   . Seizures 06/11/2012    new onset/notes (06/11/2012)  . SOB (shortness of breath)     "sometimes when I lay down;; related to not taking my RX" (06/11/2012)  . Myocardial infarction     06/10/2012  . Atrial fibrillation   . Stroke   . Atrial thrombus     left  . Pulmonary HTN     Past Surgical History  Procedure Laterality Date  . Throat surgery  1942    "and nose" (06/11/2012); ?T&A  . Inguinal hernia repair  ~ 2008    "both sides" (06/11/2012)  . Tee without  cardioversion  06/29/2012    Procedure: TRANSESOPHAGEAL ECHOCARDIOGRAM (TEE);  Surgeon: Jolaine Artist, MD;  Location: [facility] ENDOSCOPY;  Service: Cardiovascular;  Laterality: N/A;  will need a spanish interpreter Interpreter will be here at 1330-Hope spoke w/ [place]  . Cardioversion N/A 08/14/2012    Procedure: CARDIOVERSION;  Surgeon: Jolaine Artist, MD;  Location: [facility] ENDOSCOPY;  Service: Cardiovascular;  Laterality: N/A;  . Tee without cardioversion N/A 11/12/2013    Procedure: TRANSESOPHAGEAL ECHOCARDIOGRAM (TEE);  Surgeon: Candee Furbish, MD;  Location: [facility] ENDOSCOPY;  Service: Cardiovascular;  Laterality: N/A;  . Tee without cardioversion N/A 11/19/2013    Procedure: TRANSESOPHAGEAL ECHOCARDIOGRAM (TEE);  Surgeon: Jolaine Artist, MD;  Location: [facility] ENDOSCOPY;  Service: Cardiovascular;  Laterality: N/A;  . Cardioversion N/A 12/27/2013    Procedure: CARDIOVERSION;  Surgeon: Jolaine Artist, MD;  Location: [facility] ENDOSCOPY;  Service: Cardiovascular;  Laterality: N/A;    Terrace Arabia RD, LDN

## 2014-03-17 NOTE — Progress Notes (Signed)
ANTICOAGULATION CONSULT NOTE - Initial Consult  Pharmacy Consult for Coumadin Indication: atrial fibrillation  Allergies  Allergen Reactions  . Lisinopril Rash    Patient Measurements: Height: 5\' 8"  (172.7 cm) Weight: 166 lb 0.1 oz (75.3 kg) IBW/kg (Calculated) : 68.4  Vital Signs: Temp: 97.3 F (36.3 C) (09/28 1154) Temp src: Oral (09/28 1154) BP: 98/53 mmHg (09/28 1154) Pulse Rate: 92 (09/28 0803)  Labs:  Recent Labs  03/16/14 1615 03/16/14 2116 03/17/14 0442  HGB 11.9*  --  10.0*  HCT 35.2*  --  31.5*  PLT 281  --  235  LABPROT  --  35.5* 41.4*  INR  --  3.55* 4.32*  CREATININE 1.97*  --  2.19*    Estimated Creatinine Clearance: 26.5 ml/min (by C-G formula based on Cr of 2.19).   Medical History: Past Medical History  Diagnosis Date  . HTN (hypertension)   . High cholesterol   . CHF (congestive heart failure)   . BBBB (bilateral bundle branch block)   . LBBB (left bundle branch block)   . Seizures 06/11/2012    new onset/notes (06/11/2012)  . SOB (shortness of breath)     "sometimes when I lay down;; related to not taking my RX" (06/11/2012)  . Myocardial infarction     06/10/2012  . Atrial fibrillation   . Stroke   . Atrial thrombus     left  . Pulmonary HTN     Assessment: Tyler Christensen is an 81 y.o. male admitted on 03/16/2014 presenting with sepsis likely 2/2 to infected PICC line & possible PNA.   Patient has a PMH of PAF.  Pharmacy as been consulted to dose coumadin.  INR today is supratherapeutic at 4.32 and risen despite holding of warfarin yesterday.  Patient is also currently on amiodarone.  2.5 mg of Vit. K was given today.  H/H low but stable.  No s/sx of bleeding.    Goal of Therapy:  INR 2-3   Plan:  - Continue to hold Coumadin - Monitor daily INR - Monitor for s/sx of bleeding  Hassie Bruce, Pharm. D. Clinical Pharmacy Resident Pager: [phone] Ph: [phone] 03/17/2014 1:09 PM

## 2014-03-17 NOTE — Progress Notes (Addendum)
[facility] PROGRESS NOTE Interim History: [name] is a 80 y.o. male w/ PMHx significant for NICM with EF of 20% on chronic milrinone, HTN, pulm HTN, chronic renal insufficiency (baseline CR 1.8), retroperitoneal mass and PAF. Presented to the ED 9/27 with fevers 103 and erythema around PICC. Concern for sepsis and PICC exchanged and pending blood cultures. CT abdomen (9/28): increasing infiltration and atelectasis in the lung bases possible PNA. Renal function trending up, Cr 2.19. CBC 25.6.    Filed Weights   03/16/14 1646 03/17/14 0604  Weight: 73.936 kg (163 lb) 75.3 kg (166 lb 0.1 oz)        Intake/Output Summary (Last 24 hours) at 03/17/14 1016 Last data filed at 03/17/14 1572  Gross per 24 hour  Intake   2503 ml  Output    300 ml  Net   2203 ml     Assessment/Plan: 1-Severe sepsis: due to HCAP and  Gram positive cocci septicemia -will continue vancomycin and cefepime  -discontinue PICC -central assess holiday due to septicemia -follow blood cx's -will start pulmicort BID and flutter valve  2-gram positive cocci septicemia: concerns for staph infection coming from infected PICC line. -continue current antibiotics coverage and follow Blood cx's -remove Picc line  3-acute on chronic systolic heart failure: on milrinone drip. -holding diuretics given current hypotension -check CVP before discontinue PICC -cardiology heart failure team on board, will follow rec's  4-HCAP: recent hospitalization and infiltrates seen on CT scan -continue current IV antibiotics -follow cx's -start flutter valve and pulmicort  5-Acute respiratory failure with hypoxia: secondary to #3 and #4 -treatment as mentioned above -oxygen supplementation  6-Nausea & vomiting: neg troponin. -continue PRN antiemetics  -full liquid diet for now  7-CKD (chronic kidney disease) stage 3, GFR 30-59 ml/min: with mild elevation on Cr, most likely from hypotension and decrease  perfusion. -holding diuretics for today -follow BMET  8-coagulopathy: INR 4.3 -will hold coumadin today -low dose vit K ordered  9-PAF: continue coumadin per pharmacy once INR in therapeutic range. -continue amiodarone  10-pulmonary HTN: continue sildenafil   11-albumin 2.8: secondary to mild protein calorie malnutrition  -will start ensure  Code Status: Full Family Communication: no family at bedside Disposition Plan: continue acute management in stepdown; remove PICC, continue IV broad spectrum antibiotics.   Consultants:  Cardiology   Procedures: ECHO: last Echo in May 2015 EF 15-25%, diffuse hypokinesis and mild mitral regurgitation  Antibiotics:  Cefepime  Vancomycin  HPI/Subjective: Feeling better and denying CP, nausea and vomiting. Reports SOB is better. High fever in am.  Objective: Filed Vitals:   03/17/14 0700 03/17/14 0800 03/17/14 0803 03/17/14 0922  BP: 95/44 83/42 83/42    Pulse: 94 91 92   Temp:   98.8 F (37.1 C)   TempSrc:   Oral   Resp: 21 19 26    Height:      Weight:      SpO2: 97% 99% 97% 97%     Exam:  General: Alert, awake, oriented x3, in no acute distress; afebrile early morning. Reports breathing is ok and denies any further episodes of nausea and vomiting.  HEENT: No bruits, no goiter. Mild JVD Heart: regular rate, no rubs or gallops. Trace edema LE bilaterally, no cyanosis Lungs: decrease BS at bases bilaterally, slight wheezing and positive scattered rhonchi.  Abdomen: Soft, mild tenderness on mid epigastric area, soft, slight distension. Positive BS.  Skin: RUE with warmness, cellulitis findings and PICC line in place. Neuro: Grossly intact, nonfocal.  Data Reviewed: Basic Metabolic Panel:  Recent Labs Lab 03/16/14 1615 03/17/14 0442  NA 141 141  K 3.8 3.7  CL 98 102  CO2 29 25  GLUCOSE 135* 141*  BUN 27* 30*  CREATININE 1.97* 2.19*  CALCIUM 9.0 8.0*   Liver Function Tests:  Recent Labs Lab 03/16/14 1615  03/17/14 0442  AST 29 51*  ALT 18 31  ALKPHOS 107 106  BILITOT 0.9 0.7  PROT 7.3 6.2  ALBUMIN 3.4* 2.8*    Recent Labs Lab 03/16/14 2116  LIPASE 11   CBC:  Recent Labs Lab 03/16/14 1615 03/17/14 0442  WBC 25.7* 25.6*  NEUTROABS 24.4* 23.8*  HGB 11.9* 10.0*  HCT 35.2* 31.5*  MCV 94.1 95.2  PLT 281 235   BNP (last 3 results)  Recent Labs  11/07/13 0708 03/01/14 1959 03/08/14 2230  PROBNP 9356.0* 11880.0* 15334.0*   CBG:  Recent Labs Lab 03/17/14 0638  GLUCAP 128*    Recent Results (from the past 240 hour(s))  CULTURE, BLOOD (ROUTINE X 2)     Status: None   Collection Time    03/16/14  4:15 PM      Result Value Ref Range Status   Specimen Description BLOOD BLOOD LEFT FOREARM   Final   Special Requests BOTTLES DRAWN AEROBIC AND ANAEROBIC 10CC EACH   Final   Culture  Setup Time     Final   Value: 03/17/2014 00:28     Performed at [facility]   Culture     Final   Value: GRAM POSITIVE COCCI IN CLUSTERS     Note: Gram Stain Report Called to,Read Back By and Verified With: Valente David @ 10AM 9.28.15 DESIS     Performed at [facility]   Report Status PENDING   Incomplete  CULTURE, BLOOD (ROUTINE X 2)     Status: None   Collection Time    03/16/14  4:37 PM      Result Value Ref Range Status   Specimen Description BLOOD LEFT HAND   Final   Special Requests BOTTLES DRAWN AEROBIC AND ANAEROBIC 5CC EACH   Final   Culture  Setup Time     Final   Value: 03/17/2014 00:28     Performed at [facility]   Culture     Final   Value: GRAM POSITIVE COCCI IN CLUSTERS     Note: Gram Stain Report Called to,Read Back By and Verified With: Valente David @ 10AM 9.28.15 DESIS     Performed at [facility]   Report Status PENDING   Incomplete  MRSA PCR SCREENING     Status: None   Collection Time    03/17/14  6:22 AM      Result Value Ref Range Status   MRSA by PCR NEGATIVE  NEGATIVE Final   Comment:            The GeneXpert MRSA Assay (FDA      approved for NASAL specimens     only), is one component of a     comprehensive MRSA colonization     surveillance program. It is not     intended to diagnose MRSA     infection nor to guide or     monitor treatment for     MRSA infections.    Studies: Ct Abdomen Pelvis Wo Contrast  03/17/2014   CLINICAL DATA:  Abdominal pain.  Nausea and vomiting.  EXAM: CT ABDOMEN AND PELVIS WITHOUT CONTRAST  TECHNIQUE: Multidetector CT imaging of the abdomen and pelvis was performed following the standard protocol without IV contrast.  COMPARISON:  03/02/2014  FINDINGS: Technically limited study due to motion artifact. Infiltration and atelectasis in the lung bases. This is progressing since previous study. Changes may indicate pneumonia. Diffuse cardiac enlargement.  The unenhanced appearance of the liver, spleen, gallbladder, pancreas, adrenal glands, inferior vena cava, and retroperitoneal lymph nodes is unremarkable. Diffuse calcification of the abdominal aorta with congenital small diameter. Exophytic lesions arising from both kidneys, largest on the left measuring 5.1 cm diameter. These are likely cysts. No hydronephrosis in either kidney. Heterogeneous fat density mass in the right side of the retroperitoneum posterior to the vena cava and adjacent to the psoas muscle. This lesion measures about 5.3 x 3.4 cm. Appearance suggests liposarcoma. Stomach, small bowel, and colon are not abnormally distended. Small esophageal hiatal hernia. No free air or free fluid in the abdomen.  Pelvis: Lipoma in the inferior left flank muscles. Prostate gland is enlarged. Bladder wall is not thickened. Appendix is not identified but no inflammatory changes are suggested in the right lower quadrant. No evidence of diverticulitis. Scattered diverticula are present in the sigmoid colon. Degenerative changes in the spine. No destructive bone lesions appreciated.  IMPRESSION: Increasing infiltration and atelectasis in the lung  bases suggesting possible pneumonia. Otherwise no significant change since previous study. Again demonstrated is a heterogeneous fat density mass in the right retroperitoneum which may indicate liposarcoma. Additional incidental findings as discussed.   Electronically Signed   By: Lucienne Capers M.D.   On: 03/17/2014 03:51   Dg Chest Portable 1 View  03/16/2014   CLINICAL DATA:  Fever.  CHF.  Fluids given.  EXAM: PORTABLE CHEST - 1 VIEW  COMPARISON:  03/16/2014  FINDINGS: Cardiac pacemaker. Left PICC catheter is unchanged in position. Shallow inspiration. Cardiac enlargement with increased pulmonary vascularity and perihilar interstitial edema. Edema pattern is slightly progressed since previous study. No blunting of costophrenic angles. No pneumothorax.  IMPRESSION: Cardiac enlargement with pulmonary vascular congestion and increasing interstitial edema since previous study.   Electronically Signed   By: Lucienne Capers M.D.   On: 03/16/2014 21:26   Dg Chest Port 1 View  03/16/2014   CLINICAL DATA:  Fever.  EXAM: PORTABLE CHEST - 1 VIEW  COMPARISON:  March 13, 2014.  FINDINGS: Stable cardiomegaly. Left-sided pacemaker is unchanged in position. Right-sided PICC line is noted with distal tip in the expected position the SVC. Mild central pulmonary vascular congestion and perihilar edema is noted. No pneumothorax or pleural effusion is noted. Bony thorax is intact.  IMPRESSION: Mild central pulmonary vascular congestion and mild perihilar edema is noted bilaterally.   Electronically Signed   By: Sabino Dick M.D.   On: 03/16/2014 17:38   Dg Abd Portable 1v  03/16/2014   CLINICAL DATA:  Nausea/vomiting  EXAM: PORTABLE ABDOMEN - 1 VIEW  COMPARISON:  03/06/2014  FINDINGS: Nonspecific but nonobstructive bowel gas pattern.  Stomach is mild distended.  Degenerative changes of the lumbar spine.  IMPRESSION: Mild gastric distention.  Otherwise unremarkable abdominal radiograph.   Electronically Signed   By:  Julian Hy M.D.   On: 03/16/2014 21:42    Scheduled Meds: . amiodarone  200 mg Oral BID  . atorvastatin  20 mg Oral q1800  . budesonide (PULMICORT) nebulizer solution  0.25 mg Nebulization BID  . ceFEPime (MAXIPIME) IV  1 g Intravenous Q24H  . levalbuterol  0.63 mg Nebulization Q6H  . loratadine  10 mg Oral Daily  . magnesium oxide  400 mg Oral Daily  . phytonadione  2.5 mg Oral Once  . potassium chloride SA  20 mEq Oral Daily  . sildenafil  20 mg Oral TID  . sodium chloride  3 mL Intravenous Q12H  . vancomycin  1,000 mg Intravenous Q24H  . Warfarin - Pharmacist Dosing Inpatient   Does not apply q1800   Continuous Infusions: . milrinone 0.375 mcg/kg/min (03/17/14 8295)   Time: > 30 minutes  Barton Dubois  [facility] Pager [phone]. If 8PM-8AM, please contact night-coverage at [email or web address], password [facility] 03/17/2014, 10:16 AM  LOS: 1 day

## 2014-03-17 NOTE — Progress Notes (Signed)
Pt arrived to room 2H24 at approximately 0600 with home milrinone infusing into RUA PICC. Infusion pump settings currently at 1.6 mL/hr (concentration of milrinone 1mg /mL.) Pt denies SOB and abdominal pain at this time. Johnsie Cancel, RN

## 2014-03-17 NOTE — Progress Notes (Signed)
Advanced Heart Failure Rounding Note   Subjective:    Tyler Christensen is a 80 y.o. male w/ PMHx significant for NICM with EF of 20% on chronic milrinone, HTN, pulm HTN, chronic renal insufficiency (baseline CR 1.8), retroperitoneal mass and PAF.  Just recently discharged 9/18 after AKI. Discharged weight was 160 lbs. Called last week about PICC line being out 7 cm and was scheduled for PICC exchange which he underwent (9/24).  Presented to the ED 9/27 with fevers 103 and erythema around PICC. Concern for sepsis and PICC exchanged and pending blood cultures. CT abdomen (9/28): increasing infiltration and atelectasis in the lung bases possible PNA. Renal function trending up, Cr 2.19. CBC 25.6.    Objective:   Weight Range:  Vital Signs:   Temp:  [98.1 F (36.7 C)-104.1 F (40.1 C)] 98.1 F (36.7 C) (09/28 0604) Pulse Rate:  [87-116] 92 (09/28 0645) Resp:  [16-43] 26 (09/28 0645) BP: (87-138)/(38-78) 93/45 mmHg (09/28 0645) SpO2:  [84 %-98 %] 98 % (09/28 0645) Weight:  [163 lb (73.936 kg)-166 lb 0.1 oz (75.3 kg)] 166 lb 0.1 oz (75.3 kg) (09/28 0604) Last BM Date: 03/17/14  Weight change: Filed Weights   03/16/14 1646 03/17/14 0604  Weight: 163 lb (73.936 kg) 166 lb 0.1 oz (75.3 kg)    Intake/Output:   Intake/Output Summary (Last 24 hours) at 03/17/14 0720 Last data filed at 03/16/14 2114  Gross per 24 hour  Intake   2500 ml  Output    300 ml  Net   2200 ml     Physical Exam: General:  Elderly appearing. No resp difficulty, on 2L [place] HEENT: normal Neck: supple. JVP 7-8 . Carotids 2+ bilat; no bruits. No lymphadenopathy or thryomegaly appreciated. Cor: PMI laterally discplaced. Distant regular Lungs: diminished in the bases. Abdomen: tender, +++ distended. No hepatosplenomegaly. No bruits or masses. Good bowel sounds. Extremities: no cyanosis, clubbing, rash, edema, R arm erythema and warm to touch and edematous Neuro: alert & orientedx3, cranial nerves grossly intact. moves  all 4 extremities w/o difficulty. Affect pleasant  Telemetry: V paced 90s  Labs: Basic Metabolic Panel:  Recent Labs Lab 03/16/14 1615 03/17/14 0442  NA 141 141  K 3.8 3.7  CL 98 102  CO2 29 25  GLUCOSE 135* 141*  BUN 27* 30*  CREATININE 1.97* 2.19*  CALCIUM 9.0 8.0*    Liver Function Tests:  Recent Labs Lab 03/16/14 1615 03/17/14 0442  AST 29 51*  ALT 18 31  ALKPHOS 107 106  BILITOT 0.9 0.7  PROT 7.3 6.2  ALBUMIN 3.4* 2.8*    Recent Labs Lab 03/16/14 2116  LIPASE 11   No results found for this basename: AMMONIA,  in the last 168 hours  CBC:  Recent Labs Lab 03/16/14 1615 03/17/14 0442  WBC 25.7* 25.6*  NEUTROABS 24.4* 23.8*  HGB 11.9* 10.0*  HCT 35.2* 31.5*  MCV 94.1 95.2  PLT 281 235    Cardiac Enzymes: No results found for this basename: CKTOTAL, CKMB, CKMBINDEX, TROPONINI,  in the last 168 hours  BNP: BNP (last 3 results)  Recent Labs  11/07/13 0708 03/01/14 1959 03/08/14 2230  PROBNP 9356.0* 11880.0* 15334.0*     Other results:  EKG:   Imaging: Ct Abdomen Pelvis Wo Contrast  03/17/2014   CLINICAL DATA:  Abdominal pain.  Nausea and vomiting.  EXAM: CT ABDOMEN AND PELVIS WITHOUT CONTRAST  TECHNIQUE: Multidetector CT imaging of the abdomen and pelvis was performed following the standard protocol without IV contrast.  COMPARISON:  03/02/2014  FINDINGS: Technically limited study due to motion artifact. Infiltration and atelectasis in the lung bases. This is progressing since previous study. Changes may indicate pneumonia. Diffuse cardiac enlargement.  The unenhanced appearance of the liver, spleen, gallbladder, pancreas, adrenal glands, inferior vena cava, and retroperitoneal lymph nodes is unremarkable. Diffuse calcification of the abdominal aorta with congenital small diameter. Exophytic lesions arising from both kidneys, largest on the left measuring 5.1 cm diameter. These are likely cysts. No hydronephrosis in either kidney.  Heterogeneous fat density mass in the right side of the retroperitoneum posterior to the vena cava and adjacent to the psoas muscle. This lesion measures about 5.3 x 3.4 cm. Appearance suggests liposarcoma. Stomach, small bowel, and colon are not abnormally distended. Small esophageal hiatal hernia. No free air or free fluid in the abdomen.  Pelvis: Lipoma in the inferior left flank muscles. Prostate gland is enlarged. Bladder wall is not thickened. Appendix is not identified but no inflammatory changes are suggested in the right lower quadrant. No evidence of diverticulitis. Scattered diverticula are present in the sigmoid colon. Degenerative changes in the spine. No destructive bone lesions appreciated.  IMPRESSION: Increasing infiltration and atelectasis in the lung bases suggesting possible pneumonia. Otherwise no significant change since previous study. Again demonstrated is a heterogeneous fat density mass in the right retroperitoneum which may indicate liposarcoma. Additional incidental findings as discussed.   Electronically Signed   By: Lucienne Capers M.D.   On: 03/17/2014 03:51   Dg Chest Portable 1 View  03/16/2014   CLINICAL DATA:  Fever.  CHF.  Fluids given.  EXAM: PORTABLE CHEST - 1 VIEW  COMPARISON:  03/16/2014  FINDINGS: Cardiac pacemaker. Left PICC catheter is unchanged in position. Shallow inspiration. Cardiac enlargement with increased pulmonary vascularity and perihilar interstitial edema. Edema pattern is slightly progressed since previous study. No blunting of costophrenic angles. No pneumothorax.  IMPRESSION: Cardiac enlargement with pulmonary vascular congestion and increasing interstitial edema since previous study.   Electronically Signed   By: Lucienne Capers M.D.   On: 03/16/2014 21:26   Dg Chest Port 1 View  03/16/2014   CLINICAL DATA:  Fever.  EXAM: PORTABLE CHEST - 1 VIEW  COMPARISON:  March 13, 2014.  FINDINGS: Stable cardiomegaly. Left-sided pacemaker is unchanged in  position. Right-sided PICC line is noted with distal tip in the expected position the SVC. Mild central pulmonary vascular congestion and perihilar edema is noted. No pneumothorax or pleural effusion is noted. Bony thorax is intact.  IMPRESSION: Mild central pulmonary vascular congestion and mild perihilar edema is noted bilaterally.   Electronically Signed   By: Sabino Dick M.D.   On: 03/16/2014 17:38   Dg Abd Portable 1v  03/16/2014   CLINICAL DATA:  Nausea/vomiting  EXAM: PORTABLE ABDOMEN - 1 VIEW  COMPARISON:  03/06/2014  FINDINGS: Nonspecific but nonobstructive bowel gas pattern.  Stomach is mild distended.  Degenerative changes of the lumbar spine.  IMPRESSION: Mild gastric distention.  Otherwise unremarkable abdominal radiograph.   Electronically Signed   By: Julian Hy M.D.   On: 03/16/2014 21:42      Medications:     Scheduled Medications: . amiodarone  200 mg Oral BID  . atorvastatin  20 mg Oral q1800  . ceFEPime (MAXIPIME) IV  1 g Intravenous Q24H  . furosemide  80 mg Intravenous Q12H  . levalbuterol  0.63 mg Nebulization Q6H  . loratadine  10 mg Oral Daily  . magnesium oxide  400 mg Oral Daily  .  metolazone  2.5 mg Oral Daily  . potassium chloride SA  20 mEq Oral Daily  . sildenafil  20 mg Oral TID  . sodium chloride  3 mL Intravenous Q12H  . torsemide  20 mg Oral Daily  . vancomycin  1,000 mg Intravenous Q24H  . Warfarin - Pharmacist Dosing Inpatient   Does not apply q1800     Infusions: . 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 Grima is well known to the HF team and was just admitted for AKI and abdominal distention. He went  home on 9/18 and called the HF clinic last week d/t PICC being out 7 cm. Underwent exchange 9/24 and subsequently presented to the ED on 9/27 with fevers and erythema around PICC line. He also has possible PNA noted on CT from yesterday.   PICC has been exchanged again and pending blood cultures. R arm remains edematous, warm to touch and he has erythema. WBC is 25.6 this am. Will continue milrinone 0.375 mcg, will follow CVPs.  Consult Palliative care to discuss code status and prognosis.   Concern for possible PNA. He is on cefepime and Vanc. Managed by primary team.   Length of Stay: 1 Rande Brunt NP-C 03/17/2014, 7:20 AM  Advanced Heart Failure Team Pager [phone] (M-F; 7a - 4p)  Please contact [facility] for night-coverage after hours (4p -7a ) and weekends on [email or web address]  Patient seen with NP, agree with the above note.  Patient now with soft BP, septic and febrile.  Cellulitis at PICC site +/- HCAP (recent hospitalization).    Will check CVP off PICC.  Will hold diuretics today.  Continue antibiotics.    Needs discussion of code/hospice, will involve palliative care.   [name] 03/17/2014 8:01 AM

## 2014-03-17 NOTE — ED Notes (Signed)
Patient transported to CT 

## 2014-03-17 NOTE — Progress Notes (Signed)
CRITICAL VALUE ALERT  Critical value received:  Blood culture. Gram positive cocci in clusters, both sets.   Date of notification:  03/17/14  Time of notification:  0957  Critical value read back:Yes.    Nurse who received alert:  Caryl Asp, RN  MD notified (1st page):  N/a. Dr. Dyann Kief at the bedside when lab called  Time of first page:  n/a  MD notified (2nd page):  Time of second page:  Responding MD:  Dr. Dyann Kief  Time MD responded:  [phone]

## 2014-03-17 NOTE — Progress Notes (Signed)
[facility]  Patient Status: Active with [facility] prior to this admission  [facility] is providing the following services:  HHRN and [facility] team for home Milrinone. [facility] hospital team will follow and support transition at time of DC as deemed appropriate by HF team.   If patient discharges after hours, please call [phone].   Larry Sierras 03/17/2014, 5:51 PM

## 2014-03-17 NOTE — Care Management Note (Addendum)
    Page 1 of 2   04/06/2014     7:54:05 AM CARE MANAGEMENT NOTE 04/06/2014  Patient:  Tyler Christensen, Tyler Christensen   Account Number:  [id number]  Date Initiated:  03/17/2014  Documentation initiated by:  Elissa Hefty  Subjective/Objective Assessment:   adm w sepsis     Action/Plan:   lives w fam, pcp dr Orpah Greek williams, act w [facility] for home milrinone   Anticipated DC Date:  04/02/2014   Anticipated DC Plan:  [place]  CM consult      Choice offered to / List presented to:     DME arranged  [facility]      DME agency  [place] arranged  HH-1 RN  [place].   Status of service:  In process, will continue to follow Medicare Important Message given?  YES (If response is "NO", the following Medicare IM given date fields will be blank) Date Medicare IM given:  03/19/2014 Medicare IM given by:  Elissa Hefty Date Additional Medicare IM given:  04/05/2014 Additional Medicare IM given by:  [facility] Westyn Driggers  Discharge Disposition:  [place]  Per UR Regulation:  Reviewed for med. necessity/level of care/duration of stay  If discussed at [place] of Stay Meetings, dates discussed:   03/25/2014  03/27/2014    Comments:  04/05/14 CM spoke with [place] in [place] and Rn to come to pt room to hook up milrinone at 12:00.  CM called DME rep, to deliver O2 to room prior to discharge.  Pt set up with [facility] for [facility].  No other CM needs were communicated. Mariane Masters, BSN, [place].  03/31/14 1015 Additional Medicare Important Message given.  Possible D/C home with Home Health services tomorrow. Camellia J. Clydene Laming, [place], [place], [place] [phone].  03/28/14 1015 Additional Medicare Important Message given.  Possible D/C home with Home Health services today. Camellia J. Clydene Laming, [place], [place], [place] [phone].  9/28 1122 debbie dowell rn,bsn spoke w debbie taylor w  [facility] and pt gets hhrn and home iv milrinone.

## 2014-03-18 ENCOUNTER — Inpatient Hospital Stay (HOSPITAL_COMMUNITY): Payer: Medicare Other

## 2014-03-18 DIAGNOSIS — R1900 Intra-abdominal and pelvic swelling, mass and lump, unspecified site: Secondary | ICD-10-CM

## 2014-03-18 DIAGNOSIS — Z7189 Other specified counseling: Secondary | ICD-10-CM

## 2014-03-18 DIAGNOSIS — I4891 Unspecified atrial fibrillation: Secondary | ICD-10-CM

## 2014-03-18 DIAGNOSIS — I5022 Chronic systolic (congestive) heart failure: Secondary | ICD-10-CM

## 2014-03-18 LAB — GLUCOSE, CAPILLARY
GLUCOSE-CAPILLARY: 130 mg/dL — AB (ref 70–99)
Glucose-Capillary: 168 mg/dL — ABNORMAL HIGH (ref 70–99)
Glucose-Capillary: 77 mg/dL (ref 70–99)
Glucose-Capillary: 163 mg/dL — ABNORMAL HIGH (ref 70–99)

## 2014-03-18 LAB — BASIC METABOLIC PANEL
Anion gap: 14 (ref 5–15)
BUN: 35 mg/dL — AB (ref 6–23)
CHLORIDE: 100 meq/L (ref 96–112)
CO2: 22 mEq/L (ref 19–32)
Calcium: 8.4 mg/dL (ref 8.4–10.5)
Creatinine, Ser: 1.61 mg/dL — ABNORMAL HIGH (ref 0.50–1.35)
GFR calc non Af Amer: 39 mL/min — ABNORMAL LOW (ref >90)
GFR, EST AFRICAN AMERICAN: 45 mL/min — AB (ref >90)
Glucose, Bld: 152 mg/dL — ABNORMAL HIGH (ref 70–99)
Potassium: 4.2 mEq/L (ref 3.7–5.3)
Sodium: 136 mEq/L — ABNORMAL LOW (ref 137–147)

## 2014-03-18 LAB — PROTIME-INR
INR: 2.9 — ABNORMAL HIGH (ref 0.00–1.49)
PROTHROMBIN TIME: 30.3 s — AB (ref 11.6–15.2)

## 2014-03-18 LAB — URINE CULTURE
COLONY COUNT: NO GROWTH
Culture: NO GROWTH

## 2014-03-18 MED ORDER — SIMETHICONE 40 MG/0.6ML PO SUSP
40.0000 mg | Freq: Four times a day (QID) | ORAL | Status: DC | PRN
  Filled 2014-03-18: qty 0.6

## 2014-03-18 MED ORDER — WARFARIN SODIUM 4 MG PO TABS
4.0000 mg | ORAL_TABLET | Freq: Once | ORAL | Status: AC
  Administered 2014-03-18: 4 mg via ORAL
  Filled 2014-03-18 (×2): qty 1

## 2014-03-18 NOTE — Consult Note (Signed)
[name]ious [facility]  Date of Admission:  03/16/2014  Date of Consult:  03/18/2014  Reason for Consult: Staph bacteremia Referring Physician: CHAMP  Impression/Recommendation Staph bacteremia CHF (home milrinone) CKD III R retroperitoneal mass  Would Check repeat BCx Consider TEE (although I am not sure that his respiratory status would make him a good candidate).  Will need repeat central line. Consider placing when repeat BCx are NGTD.   Thank you so much for this interesting consult,   [name] (pager) [phone] [email or web address]  Tyler Christensen is an 80 y.o. male.  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 He was found to have erythema at his [facility] site (placed 9-15). His PIC was removed and he was started on vanco/cefepime. He continues have fever to 104 in hospital. He had CT in hospital notable for PNA at lung bases.    Past Medical History  Diagnosis Date  . HTN (hypertension)   . High cholesterol   . CHF (congestive heart failure)   . BBBB (bilateral bundle branch block)   . LBBB (left bundle branch block)   . Seizures 06/11/2012    new onset/notes (06/11/2012)  . SOB (shortness of breath)     "sometimes when I lay down;; related to not taking my RX" (06/11/2012)  . Myocardial infarction     06/10/2012  . Atrial fibrillation   . Stroke   . Atrial thrombus     left  . Pulmonary HTN     Past Surgical History  Procedure Laterality Date  . Throat surgery  1942    "and nose" (06/11/2012); ?T&A  . Inguinal hernia repair  ~ 2008    "both sides" (06/11/2012)  . Tee without cardioversion  06/29/2012    Procedure: TRANSESOPHAGEAL ECHOCARDIOGRAM (TEE);  Surgeon: Jolaine Artist, MD;  Location: [facility] ENDOSCOPY;  Service: Cardiovascular;  Laterality: N/A;  will need a spanish interpreter Interpreter will be here at 1330-Hope  spoke w/ [place]  . Cardioversion N/A 08/14/2012    Procedure: CARDIOVERSION;  Surgeon: Jolaine Artist, MD;  Location: [facility] ENDOSCOPY;  Service: Cardiovascular;  Laterality: N/A;  . Tee without cardioversion N/A 11/12/2013    Procedure: TRANSESOPHAGEAL ECHOCARDIOGRAM (TEE);  Surgeon: Candee Furbish, MD;  Location: [facility] ENDOSCOPY;  Service: Cardiovascular;  Laterality: N/A;  . Tee without cardioversion N/A 11/19/2013    Procedure: TRANSESOPHAGEAL ECHOCARDIOGRAM (TEE);  Surgeon: Jolaine Artist, MD;  Location: [facility] ENDOSCOPY;  Service: Cardiovascular;  Laterality: N/A;  . Cardioversion N/A 12/27/2013    Procedure: CARDIOVERSION;  Surgeon: Jolaine Artist, MD;  Location: [facility] ENDOSCOPY;  Service: Cardiovascular;  Laterality: N/A;     Allergies  Allergen Reactions  . Lisinopril Rash    Medications:  Scheduled: . amiodarone  200 mg Oral BID  . atorvastatin  20 mg Oral q1800  . budesonide (PULMICORT) nebulizer solution  0.25 mg Nebulization BID  . ceFEPime (MAXIPIME) IV  1 g Intravenous Q24H  . feeding supplement (ENSURE COMPLETE)  237 mL Oral TID BM  . levalbuterol  0.63 mg Nebulization Q6H  . loratadine  10 mg Oral Daily  . magnesium oxide  400 mg Oral Daily  . potassium chloride SA  20 mEq Oral Daily  . sildenafil  20 mg Oral TID  . sodium chloride  3 mL Intravenous Q12H  . vancomycin  1,000 mg Intravenous Q24H  .  warfarin  4 mg Oral ONCE-1800  . Warfarin - Pharmacist Dosing Inpatient   Does not apply q1800    Abtx:  Anti-infectives   Start     Dose/Rate Route Frequency Ordered Stop   03/17/14 1600  vancomycin (VANCOCIN) IVPB 1000 mg/200 mL premix     1,000 mg 200 mL/hr over 60 Minutes Intravenous Every 24 hours 03/16/14 1719     03/17/14 1600  ceFEPIme (MAXIPIME) 1 g in dextrose 5 % 50 mL IVPB     1 g 100 mL/hr over 30 Minutes Intravenous Every 24 hours 03/16/14 1719     03/16/14 1615  vancomycin (VANCOCIN) 1,250 mg in sodium chloride 0.9 % 250 mL IVPB     1,250 mg 166.7 mL/hr  over 90 Minutes Intravenous  Once 03/16/14 1609 03/16/14 1826   03/16/14 1615  ceFEPIme (MAXIPIME) 1 g in dextrose 5 % 50 mL IVPB     1 g 100 mL/hr over 30 Minutes Intravenous  Once 03/16/14 1609 03/16/14 1748      Total days of antibiotics: 2 (vanco/cefepime)          Social History:  reports that he has never smoked. He has never used smokeless tobacco. He reports that he does not drink alcohol or use illicit drugs.  History reviewed. No pertinent family history.  General ROS: occas cough, no cp, no sob, 82% on RA and comfortable, good apetite. see HPI.   Blood pressure 111/54, pulse 96, temperature 97.6 F (36.4 C), temperature source Oral, resp. rate 27, height $RemoveBe'5\' 8"'PXWEbTOHG$  (1.727 m), weight 75.7 kg (166 lb 14.2 oz), SpO2 92.00%. General appearance: alert, cooperative and no distress Eyes: negative findings: pupils equal, round, reactive to light and accomodation Throat: normal findings: oropharynx pink & moist without lesions or evidence of thrush Neck: no adenopathy, no JVD and supple, symmetrical, trachea midline Lungs: rhonchi bibasilar Heart: regular rate and rhythm Abdomen: normal findings: bowel sounds normal, soft, non-tender and distended Extremities: edema 2-3+ edema. RUE is dressed, mild proximal erythema.  and varicose veins noted   Results for orders placed during the hospital encounter of 03/16/14 (from the past 48 hour(s))  CULTURE, BLOOD (ROUTINE X 2)     Status: None   Collection Time    03/16/14  4:15 PM      Result Value Ref Range   Specimen Description BLOOD BLOOD LEFT FOREARM     Special Requests BOTTLES DRAWN AEROBIC AND ANAEROBIC 10CC EACH     Culture  Setup Time       Value: 03/17/2014 00:28     Performed at [facility]   Culture       Value: STAPHYLOCOCCUS AUREUS     Note: RIFAMPIN AND GENTAMICIN SHOULD NOT BE USED AS SINGLE DRUGS FOR TREATMENT OF STAPH INFECTIONS.     Note: Gram Stain Report Called to,Read Back By and Verified With: J. Alvera Singh @  10AM 9.28.15 DESIS Culture results may be compromised due to an excessive volume of blood received in culture bottles.     Performed at [facility]   Report Status PENDING    CBC WITH DIFFERENTIAL     Status: Abnormal   Collection Time    03/16/14  4:15 PM      Result Value Ref Range   WBC 25.7 (*) 4.0 - 10.5 K/uL   Comment: WHITE COUNT CONFIRMED ON SMEAR   RBC 3.74 (*) 4.22 - 5.81 MIL/uL   Hemoglobin 11.9 (*) 13.0 - 17.0 g/dL  HCT 35.2 (*) 39.0 - 52.0 %   MCV 94.1  78.0 - 100.0 fL   MCH 31.8  26.0 - 34.0 pg   MCHC 33.8  30.0 - 36.0 g/dL   RDW 15.5  11.5 - 15.5 %   Platelets 281  150 - 400 K/uL   Neutrophils Relative % 95 (*) 43 - 77 %   Lymphocytes Relative 1 (*) 12 - 46 %   Monocytes Relative 4  3 - 12 %   Eosinophils Relative 0  0 - 5 %   Basophils Relative 0  0 - 1 %   Neutro Abs 24.4 (*) 1.7 - 7.7 K/uL   Lymphs Abs 0.3 (*) 0.7 - 4.0 K/uL   Monocytes Absolute 1.0  0.1 - 1.0 K/uL   Eosinophils Absolute 0.0  0.0 - 0.7 K/uL   Basophils Absolute 0.0  0.0 - 0.1 K/uL   WBC Morphology INCREASED BANDS (>20% BANDS)    COMPREHENSIVE METABOLIC PANEL     Status: Abnormal   Collection Time    03/16/14  4:15 PM      Result Value Ref Range   Sodium 141  137 - 147 mEq/L   Potassium 3.8  3.7 - 5.3 mEq/L   Chloride 98  96 - 112 mEq/L   CO2 29  19 - 32 mEq/L   Glucose, Bld 135 (*) 70 - 99 mg/dL   BUN 27 (*) 6 - 23 mg/dL   Creatinine, Ser 1.97 (*) 0.50 - 1.35 mg/dL   Calcium 9.0  8.4 - 10.5 mg/dL   Total Protein 7.3  6.0 - 8.3 g/dL   Albumin 3.4 (*) 3.5 - 5.2 g/dL   AST 29  0 - 37 U/L   ALT 18  0 - 53 U/L   Alkaline Phosphatase 107  39 - 117 U/L   Total Bilirubin 0.9  0.3 - 1.2 mg/dL   GFR calc non Af Amer 31 (*) >90 mL/min   GFR calc Af Amer 35 (*) >90 mL/min   Comment: (NOTE)     The eGFR has been calculated using the CKD EPI equation.     This calculation has not been validated in all clinical situations.     eGFR's persistently <90 mL/min signify possible Chronic  Kidney     Disease.   Anion gap 14  5 - 15  I-STAT CG4 LACTIC ACID, ED     Status: None   Collection Time    03/16/14  4:29 PM      Result Value Ref Range   Lactic Acid, Venous 1.52  0.5 - 2.2 mmol/L  CULTURE, BLOOD (ROUTINE X 2)     Status: None   Collection Time    03/16/14  4:37 PM      Result Value Ref Range   Specimen Description BLOOD LEFT HAND     Special Requests BOTTLES DRAWN AEROBIC AND ANAEROBIC 5CC EACH     Culture  Setup Time       Value: 03/17/2014 00:28     Performed at [facility]   Culture       Value: STAPHYLOCOCCUS AUREUS     Note: Gram Stain Report Called to,Read Back By and Verified With: Valente David @ 10AM 9.28.15 DESIS     Performed at [facility]   Report Status PENDING    URINALYSIS, ROUTINE W REFLEX MICROSCOPIC     Status: Abnormal   Collection Time    03/16/14  4:59 PM  Result Value Ref Range   Color, Urine AMBER (*) YELLOW   Comment: BIOCHEMICALS MAY BE AFFECTED BY COLOR   APPearance CLOUDY (*) CLEAR   Specific Gravity, Urine 1.013  1.005 - 1.030   pH 5.0  5.0 - 8.0   Glucose, UA NEGATIVE  NEGATIVE mg/dL   Hgb urine dipstick NEGATIVE  NEGATIVE   Bilirubin Urine NEGATIVE  NEGATIVE   Ketones, ur NEGATIVE  NEGATIVE mg/dL   Protein, ur 30 (*) NEGATIVE mg/dL   Urobilinogen, UA 0.2  0.0 - 1.0 mg/dL   Nitrite NEGATIVE  NEGATIVE   Leukocytes, UA TRACE (*) NEGATIVE  URINE CULTURE     Status: None   Collection Time    03/16/14  4:59 PM      Result Value Ref Range   Specimen Description URINE, CLEAN CATCH     Special Requests NONE     Culture  Setup Time       Value: 03/17/2014 01:31     Performed at [facility]       Value: NO GROWTH     Performed at [facility]   Culture       Value: NO GROWTH     Performed at [facility]   Report Status 03/18/2014 FINAL    URINE MICROSCOPIC-ADD ON     Status: None   Collection Time    03/16/14  4:59 PM      Result Value Ref Range   Squamous  Epithelial / LPF RARE  RARE   WBC, UA 0-2  <3 WBC/hpf  PROTIME-INR     Status: Abnormal   Collection Time    03/16/14  9:16 PM      Result Value Ref Range   Prothrombin Time 35.5 (*) 11.6 - 15.2 seconds   INR 3.55 (*) 0.00 - 1.49  LIPASE, BLOOD     Status: None   Collection Time    03/16/14  9:16 PM      Result Value Ref Range   Lipase 11  11 - 59 U/L  PROTIME-INR     Status: Abnormal   Collection Time    03/17/14  4:42 AM      Result Value Ref Range   Prothrombin Time 41.4 (*) 11.6 - 15.2 seconds   INR 4.32 (*) 0.00 - 1.49  COMPREHENSIVE METABOLIC PANEL     Status: Abnormal   Collection Time    03/17/14  4:42 AM      Result Value Ref Range   Sodium 141  137 - 147 mEq/L   Potassium 3.7  3.7 - 5.3 mEq/L   Chloride 102  96 - 112 mEq/L   CO2 25  19 - 32 mEq/L   Glucose, Bld 141 (*) 70 - 99 mg/dL   BUN 30 (*) 6 - 23 mg/dL   Creatinine, Ser 2.19 (*) 0.50 - 1.35 mg/dL   Calcium 8.0 (*) 8.4 - 10.5 mg/dL   Total Protein 6.2  6.0 - 8.3 g/dL   Albumin 2.8 (*) 3.5 - 5.2 g/dL   AST 51 (*) 0 - 37 U/L   ALT 31  0 - 53 U/L   Alkaline Phosphatase 106  39 - 117 U/L   Total Bilirubin 0.7  0.3 - 1.2 mg/dL   GFR calc non Af Amer 27 (*) >90 mL/min   GFR calc Af Amer 31 (*) >90 mL/min   Comment: (NOTE)     The eGFR has been calculated using  the CKD EPI equation.     This calculation has not been validated in all clinical situations.     eGFR's persistently <90 mL/min signify possible Chronic Kidney     Disease.   Anion gap 14  5 - 15  CBC WITH DIFFERENTIAL     Status: Abnormal   Collection Time    03/17/14  4:42 AM      Result Value Ref Range   WBC 25.6 (*) 4.0 - 10.5 K/uL   RBC 3.31 (*) 4.22 - 5.81 MIL/uL   Hemoglobin 10.0 (*) 13.0 - 17.0 g/dL   HCT 31.5 (*) 39.0 - 52.0 %   MCV 95.2  78.0 - 100.0 fL   MCH 30.2  26.0 - 34.0 pg   MCHC 31.7  30.0 - 36.0 g/dL   RDW 15.6 (*) 11.5 - 15.5 %   Platelets 235  150 - 400 K/uL   Neutrophils Relative % 93 (*) 43 - 77 %   Neutro Abs 23.8 (*)  1.7 - 7.7 K/uL   Lymphocytes Relative 2 (*) 12 - 46 %   Lymphs Abs 0.4 (*) 0.7 - 4.0 K/uL   Monocytes Relative 5  3 - 12 %   Monocytes Absolute 1.4 (*) 0.1 - 1.0 K/uL   Eosinophils Relative 0  0 - 5 %   Eosinophils Absolute 0.0  0.0 - 0.7 K/uL   Basophils Relative 0  0 - 1 %   Basophils Absolute 0.0  0.0 - 0.1 K/uL  MRSA PCR SCREENING     Status: None   Collection Time    03/17/14  6:22 AM      Result Value Ref Range   MRSA by PCR NEGATIVE  NEGATIVE   Comment:            The GeneXpert MRSA Assay (FDA     approved for NASAL specimens     only), is one component of a     comprehensive MRSA colonization     surveillance program. It is not     intended to diagnose MRSA     infection nor to guide or     monitor treatment for     MRSA infections.  GLUCOSE, CAPILLARY     Status: Abnormal   Collection Time    03/17/14  6:38 AM      Result Value Ref Range   Glucose-Capillary 128 (*) 70 - 99 mg/dL   Comment 1 Capillary Sample    GLUCOSE, CAPILLARY     Status: Abnormal   Collection Time    03/17/14 11:56 AM      Result Value Ref Range   Glucose-Capillary 100 (*) 70 - 99 mg/dL  GLUCOSE, CAPILLARY     Status: Abnormal   Collection Time    03/17/14  5:55 PM      Result Value Ref Range   Glucose-Capillary 190 (*) 70 - 99 mg/dL   Comment 1 Capillary Sample    GLUCOSE, CAPILLARY     Status: Abnormal   Collection Time    03/17/14 11:43 PM      Result Value Ref Range   Glucose-Capillary 185 (*) 70 - 99 mg/dL  PROTIME-INR     Status: Abnormal   Collection Time    03/18/14  3:18 AM      Result Value Ref Range   Prothrombin Time 30.3 (*) 11.6 - 15.2 seconds   INR 2.90 (*) 0.00 - 7.82  BASIC METABOLIC PANEL     Status: Abnormal  Collection Time    03/18/14  3:18 AM      Result Value Ref Range   Sodium 136 (*) 137 - 147 mEq/L   Potassium 4.2  3.7 - 5.3 mEq/L   Chloride 100  96 - 112 mEq/L   CO2 22  19 - 32 mEq/L   Glucose, Bld 152 (*) 70 - 99 mg/dL   BUN 35 (*) 6 - 23 mg/dL    Creatinine, Ser 1.61 (*) 0.50 - 1.35 mg/dL   Calcium 8.4  8.4 - 10.5 mg/dL   GFR calc non Af Amer 39 (*) >90 mL/min   GFR calc Af Amer 45 (*) >90 mL/min   Comment: (NOTE)     The eGFR has been calculated using the CKD EPI equation.     This calculation has not been validated in all clinical situations.     eGFR's persistently <90 mL/min signify possible Chronic Kidney     Disease.   Anion gap 14  5 - 15  GLUCOSE, CAPILLARY     Status: Abnormal   Collection Time    03/18/14  6:22 AM      Result Value Ref Range   Glucose-Capillary 130 (*) 70 - 99 mg/dL  GLUCOSE, CAPILLARY     Status: None   Collection Time    03/18/14 11:41 AM      Result Value Ref Range   Glucose-Capillary 77  70 - 99 mg/dL  GLUCOSE, CAPILLARY     Status: Abnormal   Collection Time    03/18/14 11:59 AM      Result Value Ref Range   Glucose-Capillary 168 (*) 70 - 99 mg/dL   Comment 1 Notify RN        Component Value Date/Time   SDES URINE, CLEAN CATCH 03/16/2014 1659   SPECREQUEST NONE 03/16/2014 1659   CULT  Value: NO GROWTH Performed at [facility] 03/16/2014 1659   REPTSTATUS 03/18/2014 FINAL 03/16/2014 1659   Ct Abdomen Pelvis Wo Contrast  03/17/2014   CLINICAL DATA:  Abdominal pain.  Nausea and vomiting.  EXAM: CT ABDOMEN AND PELVIS WITHOUT CONTRAST  TECHNIQUE: Multidetector CT imaging of the abdomen and pelvis was performed following the standard protocol without IV contrast.  COMPARISON:  03/02/2014  FINDINGS: Technically limited study due to motion artifact. Infiltration and atelectasis in the lung bases. This is progressing since previous study. Changes may indicate pneumonia. Diffuse cardiac enlargement.  The unenhanced appearance of the liver, spleen, gallbladder, pancreas, adrenal glands, inferior vena cava, and retroperitoneal lymph nodes is unremarkable. Diffuse calcification of the abdominal aorta with congenital small diameter. Exophytic lesions arising from both kidneys, largest on the left  measuring 5.1 cm diameter. These are likely cysts. No hydronephrosis in either kidney. Heterogeneous fat density mass in the right side of the retroperitoneum posterior to the vena cava and adjacent to the psoas muscle. This lesion measures about 5.3 x 3.4 cm. Appearance suggests liposarcoma. Stomach, small bowel, and colon are not abnormally distended. Small esophageal hiatal hernia. No free air or free fluid in the abdomen.  Pelvis: Lipoma in the inferior left flank muscles. Prostate gland is enlarged. Bladder wall is not thickened. Appendix is not identified but no inflammatory changes are suggested in the right lower quadrant. No evidence of diverticulitis. Scattered diverticula are present in the sigmoid colon. Degenerative changes in the spine. No destructive bone lesions appreciated.  IMPRESSION: Increasing infiltration and atelectasis in the lung bases suggesting possible pneumonia. Otherwise no significant change since previous study.  Again demonstrated is a heterogeneous fat density mass in the right retroperitoneum which may indicate liposarcoma. Additional incidental findings as discussed.   Electronically Signed   By: Lucienne Capers M.D.   On: 03/17/2014 03:51   Dg Chest Portable 1 View  03/16/2014   CLINICAL DATA:  Fever.  CHF.  Fluids given.  EXAM: PORTABLE CHEST - 1 VIEW  COMPARISON:  03/16/2014  FINDINGS: Cardiac pacemaker. Left PICC catheter is unchanged in position. Shallow inspiration. Cardiac enlargement with increased pulmonary vascularity and perihilar interstitial edema. Edema pattern is slightly progressed since previous study. No blunting of costophrenic angles. No pneumothorax.  IMPRESSION: Cardiac enlargement with pulmonary vascular congestion and increasing interstitial edema since previous study.   Electronically Signed   By: Lucienne Capers M.D.   On: 03/16/2014 21:26   Dg Chest Port 1 View  03/16/2014   CLINICAL DATA:  Fever.  EXAM: PORTABLE CHEST - 1 VIEW  COMPARISON:   March 13, 2014.  FINDINGS: Stable cardiomegaly. Left-sided pacemaker is unchanged in position. Right-sided PICC line is noted with distal tip in the expected position the SVC. Mild central pulmonary vascular congestion and perihilar edema is noted. No pneumothorax or pleural effusion is noted. Bony thorax is intact.  IMPRESSION: Mild central pulmonary vascular congestion and mild perihilar edema is noted bilaterally.   Electronically Signed   By: Sabino Dick M.D.   On: 03/16/2014 17:38   Dg Abd Portable 1v  03/16/2014   CLINICAL DATA:  Nausea/vomiting  EXAM: PORTABLE ABDOMEN - 1 VIEW  COMPARISON:  03/06/2014  FINDINGS: Nonspecific but nonobstructive bowel gas pattern.  Stomach is mild distended.  Degenerative changes of the lumbar spine.  IMPRESSION: Mild gastric distention.  Otherwise unremarkable abdominal radiograph.   Electronically Signed   By: Julian Hy M.D.   On: 03/16/2014 21:42   Recent Results (from the past 240 hour(s))  CULTURE, BLOOD (ROUTINE X 2)     Status: None   Collection Time    03/16/14  4:15 PM      Result Value Ref Range Status   Specimen Description BLOOD BLOOD LEFT FOREARM   Final   Special Requests BOTTLES DRAWN AEROBIC AND ANAEROBIC 10CC EACH   Final   Culture  Setup Time     Final   Value: 03/17/2014 00:28     Performed at [facility]   Culture     Final   Value: STAPHYLOCOCCUS AUREUS     Note: RIFAMPIN AND GENTAMICIN SHOULD NOT BE USED AS SINGLE DRUGS FOR TREATMENT OF STAPH INFECTIONS.     Note: Gram Stain Report Called to,Read Back By and Verified With: J. Alvera Singh @ 10AM 9.28.15 DESIS Culture results may be compromised due to an excessive volume of blood received in culture bottles.     Performed at [facility]   Report Status PENDING   Incomplete  CULTURE, BLOOD (ROUTINE X 2)     Status: None   Collection Time    03/16/14  4:37 PM      Result Value Ref Range Status   Specimen Description BLOOD LEFT HAND   Final   Special Requests  BOTTLES DRAWN AEROBIC AND ANAEROBIC [facility] EACH   Final   Culture  Setup Time     Final   Value: 03/17/2014 00:28     Performed at [facility]   Culture     Final   Value: STAPHYLOCOCCUS AUREUS     Note: Gram Stain Report Called to,Read Back By and  Verified With: Valente David @ 10AM 9.28.15 DESIS     Performed at [facility]   Report Status PENDING   Incomplete  URINE CULTURE     Status: None   Collection Time    03/16/14  4:59 PM      Result Value Ref Range Status   Specimen Description URINE, CLEAN CATCH   Final   Special Requests NONE   Final   Culture  Setup Time     Final   Value: 03/17/2014 01:31     Performed at [place] [facility]     Final   Value: NO GROWTH     Performed at [facility]   Culture     Final   Value: NO GROWTH     Performed at [facility]   Report Status 03/18/2014 FINAL   Final  MRSA PCR SCREENING     Status: None   Collection Time    03/17/14  6:22 AM      Result Value Ref Range Status   MRSA by PCR NEGATIVE  NEGATIVE Final   Comment:            The GeneXpert MRSA Assay (FDA     approved for NASAL specimens     only), is one component of a     comprehensive MRSA colonization     surveillance program. It is not     intended to diagnose MRSA     infection nor to guide or     monitor treatment for     MRSA infections.      03/18/2014, 1:54 PM     LOS: 2 days

## 2014-03-18 NOTE — Progress Notes (Signed)
Patient scheduled for US guided paracentesis. Upon reviewing ultrasound images no ascites is seen. Procedure was cancelled. This has been discussed today with the patient utilizing an interpreter.   Tsosie Billing PA-C Interventional Radiology  03/18/14  4:13 PM

## 2014-03-18 NOTE — Progress Notes (Signed)
ANTICOAGULATION CONSULT NOTE - Follow Up Pharmacy Consult for Coumadin Indication: atrial fibrillation  Allergies  Allergen Reactions  . Lisinopril Rash    Patient Measurements: Height: 5\' 8"  (172.7 cm) Weight: 166 lb 14.2 oz (75.7 kg) IBW/kg (Calculated) : 68.4  Vital Signs: Temp: 97.8 F (36.6 C) (09/29 0736) Temp src: Oral (09/29 0736) BP: 98/52 mmHg (09/29 0736) Pulse Rate: 95 (09/29 0736)  Labs:  Recent Labs  03/16/14 1615 03/16/14 2116 03/17/14 0442 03/18/14 0318  HGB 11.9*  --  10.0*  --   HCT 35.2*  --  31.5*  --   PLT 281  --  235  --   LABPROT  --  35.5* 41.4* 30.3*  INR  --  3.55* 4.32* 2.90*  CREATININE 1.97*  --  2.19* 1.61*    Estimated Creatinine Clearance: 36 ml/min (by C-G formula based on Cr of 1.61).   Medical History: Past Medical History  Diagnosis Date  . HTN (hypertension)   . High cholesterol   . CHF (congestive heart failure)   . BBBB (bilateral bundle branch block)   . LBBB (left bundle branch block)   . Seizures 06/11/2012    new onset/notes (06/11/2012)  . SOB (shortness of breath)     "sometimes when I lay down;; related to not taking my RX" (06/11/2012)  . Myocardial infarction     06/10/2012  . Atrial fibrillation   . Stroke   . Atrial thrombus     left  . Pulmonary HTN     Assessment: Tyler Christensen is an 80 y.o. male admitted on 03/16/2014 presenting with sepsis likely 2/2 to infected PICC line & possible PNA.  Patient has a PMH of PAF.  Pharmacy as been consulted to dose coumadin.  INR today is supratherapeutic at 4.32 and risen despite holding of warfarin yesterday.  Patient is also currently on amiodarone.  2.5 mg of Vit. K was given 9/28.  H/H low but stable.  No s/sx of bleeding.    Coag: afib, INR down after vitamin K.   PTA 4mg  daily except Friday and Sunday takes 2mg   Goal of Therapy:  INR 2-3   Plan:  - Warfarin 4 mg today - Monitor daily INR - Monitor for s/sx of bleeding  Thank you for allowing pharmacy  to be a part of this patients care team.  Rowe Robert Pharm.D., BCPS, AQ-Cardiology Clinical Pharmacist 03/18/2014 8:31 AM Pager: [phone] Phone: [phone]

## 2014-03-18 NOTE — Progress Notes (Signed)
Advanced Heart Failure Rounding Note   Subjective:    [name] is a 80 y.o. male w/ PMHx significant for NICM with EF of 20% on chronic milrinone, HTN, pulm HTN, chronic renal insufficiency (baseline CR 1.8), retroperitoneal mass and PAF.  Just recently discharged 9/18 after AKI. Discharged weight was 160 lbs. Called last week about PICC line being out 7 cm and was scheduled for PICC exchange which he underwent (9/24).  Presented to the ED 9/27 with fevers 103 and erythema around PICC. Concern for sepsis and PICC exchanged. CT abdomen (9/28): increasing infiltration and atelectasis in the lung bases possible PNA.   PICC discontinued yesterday d/t blood cultures gram + cocci in clusters in both sets. Remains on milrinone 0.375 mcg. Renal function trending down 1.61.  Diuretics on hold.    Objective:   Weight Range:  Vital Signs:   Temp:  [97.3 F (36.3 C)-98.8 F (37.1 C)] 98.5 F (36.9 C) (09/29 0400) Pulse Rate:  [91-102] 96 (09/29 0400) Resp:  [19-47] 20 (09/29 0400) BP: (83-122)/(42-62) 103/62 mmHg (09/29 0400) SpO2:  [89 %-99 %] 89 % (09/29 0400) Weight:  [166 lb 14.2 oz (75.7 kg)] 166 lb 14.2 oz (75.7 kg) (09/29 0500) Last BM Date: 03/17/14  Weight change: Filed Weights   03/16/14 1646 03/17/14 0604 03/18/14 0500  Weight: 163 lb (73.936 kg) 166 lb 0.1 oz (75.3 kg) 166 lb 14.2 oz (75.7 kg)    Intake/Output:   Intake/Output Summary (Last 24 hours) at 03/18/14 0704 Last data filed at 03/18/14 0400  Gross per 24 hour  Intake 284.93 ml  Output    150 ml  Net 134.93 ml     Physical Exam: General:  Elderly appearing. No resp difficulty, on 2L [place] HEENT: normal Neck: supple. JVP 7-8 . Carotids 2+ bilat; no bruits. No lymphadenopathy or thryomegaly appreciated. Cor: PMI laterally discplaced. Distant regular Lungs: diminished in the bases. Abdomen: tender, +++ distended. No hepatosplenomegaly. No bruits or masses. Good bowel sounds. Extremities: no cyanosis, clubbing,  rash, edema, R arm erythema and warm to touch and edematous Neuro: alert & orientedx3, cranial nerves grossly intact. moves all 4 extremities w/o difficulty. Affect pleasant  Telemetry: V paced 90s  Labs: Basic Metabolic Panel:  Recent Labs Lab 03/16/14 1615 03/17/14 0442 03/18/14 0318  NA 141 141 136*  K 3.8 3.7 4.2  CL 98 102 100  CO2 29 25 22   GLUCOSE 135* 141* 152*  BUN 27* 30* 35*  CREATININE 1.97* 2.19* 1.61*  CALCIUM 9.0 8.0* 8.4    Liver Function Tests:  Recent Labs Lab 03/16/14 1615 03/17/14 0442  AST 29 51*  ALT 18 31  ALKPHOS 107 106  BILITOT 0.9 0.7  PROT 7.3 6.2  ALBUMIN 3.4* 2.8*    Recent Labs Lab 03/16/14 2116  LIPASE 11   No results found for this basename: AMMONIA,  in the last 168 hours  CBC:  Recent Labs Lab 03/16/14 1615 03/17/14 0442  WBC 25.7* 25.6*  NEUTROABS 24.4* 23.8*  HGB 11.9* 10.0*  HCT 35.2* 31.5*  MCV 94.1 95.2  PLT 281 235    Cardiac Enzymes: No results found for this basename: CKTOTAL, CKMB, CKMBINDEX, TROPONINI,  in the last 168 hours  BNP: BNP (last 3 results)  Recent Labs  11/07/13 0708 03/01/14 1959 03/08/14 2230  PROBNP 9356.0* 11880.0* 15334.0*     Other results:  EKG:   Imaging: Ct Abdomen Pelvis Wo Contrast  03/17/2014   CLINICAL DATA:  Abdominal pain.  Nausea  and vomiting.  EXAM: CT ABDOMEN AND PELVIS WITHOUT CONTRAST  TECHNIQUE: Multidetector CT imaging of the abdomen and pelvis was performed following the standard protocol without IV contrast.  COMPARISON:  03/02/2014  FINDINGS: Technically limited study due to motion artifact. Infiltration and atelectasis in the lung bases. This is progressing since previous study. Changes may indicate pneumonia. Diffuse cardiac enlargement.  The unenhanced appearance of the liver, spleen, gallbladder, pancreas, adrenal glands, inferior vena cava, and retroperitoneal lymph nodes is unremarkable. Diffuse calcification of the abdominal aorta with congenital  small diameter. Exophytic lesions arising from both kidneys, largest on the left measuring 5.1 cm diameter. These are likely cysts. No hydronephrosis in either kidney. Heterogeneous fat density mass in the right side of the retroperitoneum posterior to the vena cava and adjacent to the psoas muscle. This lesion measures about 5.3 x 3.4 cm. Appearance suggests liposarcoma. Stomach, small bowel, and colon are not abnormally distended. Small esophageal hiatal hernia. No free air or free fluid in the abdomen.  Pelvis: Lipoma in the inferior left flank muscles. Prostate gland is enlarged. Bladder wall is not thickened. Appendix is not identified but no inflammatory changes are suggested in the right lower quadrant. No evidence of diverticulitis. Scattered diverticula are present in the sigmoid colon. Degenerative changes in the spine. No destructive bone lesions appreciated.  IMPRESSION: Increasing infiltration and atelectasis in the lung bases suggesting possible pneumonia. Otherwise no significant change since previous study. Again demonstrated is a heterogeneous fat density mass in the right retroperitoneum which may indicate liposarcoma. Additional incidental findings as discussed.   Electronically Signed   By: Lucienne Capers M.D.   On: 03/17/2014 03:51   Dg Chest Portable 1 View  03/16/2014   CLINICAL DATA:  Fever.  CHF.  Fluids given.  EXAM: PORTABLE CHEST - 1 VIEW  COMPARISON:  03/16/2014  FINDINGS: Cardiac pacemaker. Left PICC catheter is unchanged in position. Shallow inspiration. Cardiac enlargement with increased pulmonary vascularity and perihilar interstitial edema. Edema pattern is slightly progressed since previous study. No blunting of costophrenic angles. No pneumothorax.  IMPRESSION: Cardiac enlargement with pulmonary vascular congestion and increasing interstitial edema since previous study.   Electronically Signed   By: Lucienne Capers M.D.   On: 03/16/2014 21:26   Dg Chest Port 1  View  03/16/2014   CLINICAL DATA:  Fever.  EXAM: PORTABLE CHEST - 1 VIEW  COMPARISON:  March 13, 2014.  FINDINGS: Stable cardiomegaly. Left-sided pacemaker is unchanged in position. Right-sided PICC line is noted with distal tip in the expected position the SVC. Mild central pulmonary vascular congestion and perihilar edema is noted. No pneumothorax or pleural effusion is noted. Bony thorax is intact.  IMPRESSION: Mild central pulmonary vascular congestion and mild perihilar edema is noted bilaterally.   Electronically Signed   By: Sabino Dick M.D.   On: 03/16/2014 17:38   Dg Abd Portable 1v  03/16/2014   CLINICAL DATA:  Nausea/vomiting  EXAM: PORTABLE ABDOMEN - 1 VIEW  COMPARISON:  03/06/2014  FINDINGS: Nonspecific but nonobstructive bowel gas pattern.  Stomach is mild distended.  Degenerative changes of the lumbar spine.  IMPRESSION: Mild gastric distention.  Otherwise unremarkable abdominal radiograph.   Electronically Signed   By: Julian Hy M.D.   On: 03/16/2014 21:42     Medications:     Scheduled Medications: . amiodarone  200 mg Oral BID  . atorvastatin  20 mg Oral q1800  . budesonide (PULMICORT) nebulizer solution  0.25 mg Nebulization BID  . ceFEPime (MAXIPIME)  IV  1 g Intravenous Q24H  . feeding supplement (ENSURE COMPLETE)  237 mL Oral TID BM  . levalbuterol  0.63 mg Nebulization Q6H  . loratadine  10 mg Oral Daily  . magnesium oxide  400 mg Oral Daily  . potassium chloride SA  20 mEq Oral Daily  . sildenafil  20 mg Oral TID  . sodium chloride  3 mL Intravenous Q12H  . vancomycin  1,000 mg Intravenous Q24H  . Warfarin - Pharmacist Dosing Inpatient   Does not apply q1800    Infusions: . 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 is well known to the HF team and was just admitted for AKI and abdominal distention. He went home on 9/18 and called the HF clinic last week d/t PICC being out 7 cm. Underwent exchange 9/24 and subsequently presented to the ED on 9/27 with fevers and erythema around PICC line. He also has possible PNA noted on CT.   PICC is out with GPCs in clusters in blood.    Consult Palliative care to discuss code status and prognosis.   Concern for possible PNA. He is on cefepime and Vanc. Managed by primary team.   Length of Stay: 2 Rande Brunt NP-C 03/18/2014, 7:04 AM  Advanced Heart Failure Team Pager [phone] (M-F; 7a - 4p)  Please contact [facility] for night-coverage after hours (4p -7a ) and weekends on [email or web address]  Patient seen with NP, agree with the above note. Patient looking better this morning, afebrile.  WBCs still up.  Cellulitis at PICC site +/- HCAP (recent hospitalization).    Continue antibiotics, PICC out.  Holding diuretics for now, continuing milrinone.   Needs discussion of code/hospice, will involve palliative care.  Wants to speak with Dr Haroldine Laws who knows him well.   [name] 03/18/2014

## 2014-03-18 NOTE — Progress Notes (Addendum)
[facility] PROGRESS NOTE Interim History: Tyler Christensen is a 80 y.o. male w/ PMHx significant for NICM with EF of 20% on chronic milrinone, HTN, pulm HTN, chronic renal insufficiency (baseline CR 1.8), retroperitoneal mass and PAF. Presented to the ED 9/27 with fevers 103 and erythema around PICC. Concern for sepsis and PICC exchanged and pending blood cultures. CT abdomen (9/28): increasing infiltration and atelectasis in the lung bases possible PNA. Renal function trending up, Cr 2.19. CBC 25.6.  Discussion regarding resuscitation and code status sustained, as per patient wishes will change code status to DNR.   Filed Weights   03/16/14 1646 03/17/14 0604 03/18/14 0500  Weight: 73.936 kg (163 lb) 75.3 kg (166 lb 0.1 oz) 75.7 kg (166 lb 14.2 oz)        Intake/Output Summary (Last 24 hours) at 03/18/14 0841 Last data filed at 03/18/14 0400  Gross per 24 hour  Intake 281.93 ml  Output    150 ml  Net 131.93 ml     Assessment/Plan: 1-Severe sepsis: due to HCAP and  Gram positive cocci septicemia -will continue vancomycin and cefepime  -discontinued PICC on 9/28 -central assess holiday due to septicemia -follow blood cx's -will continue pulmicort BID and flutter valve  2-gram positive cocci septicemia: concerns for staph infection coming from infected PICC line. -continue current antibiotics coverage and follow Blood cx's for speciation and sensitivity -Picc line removed  3-acute on chronic systolic heart failure: continue milrinone drip. -continue holding diuretics  -cardiology heart failure team on board, will follow rec's  4-HCAP: recent hospitalization and infiltrates seen on CT scan -continue current IV antibiotics (vancomycin and cefepime Day #2) -follow cx's -continue flutter valve and pulmicort  5-Acute respiratory failure with hypoxia: secondary to #3 and #4 -treatment as mentioned above -continue oxygen supplementation  6-Nausea & vomiting: neg  troponin. -continue PRN antiemetics  -full liquid diet for now  7-CKD (chronic kidney disease) stage 3, GFR 30-59 ml/min: with mild elevation on Cr, most likely from hypotension and decrease perfusion. -Cr is better today (9/29) 1.61 -continue holding diuretics for now -follow BMET  8-coagulopathy: INR 4.3 -after holding coumadin and low dose Vit K; coagulopathy is now reversed -INR 2.9 -pharmacy will continue dosing coumadin (appreciate their assistance)  9-PAF: continue coumadin per pharmacy. -INR 2.9 -continue amiodarone  10-pulmonary HTN: continue sildenafil   11-albumin 2.8 and inadequate PO intake: mild protein calorie malnutrition  -will continue ensure  12-diarrhea: probably associated to current antibiotics; will check C. Diff by PCR (Given diarrhea, elevated WBC's (that could be explained by HCAP/bacteremia) and distended abdomen.  13-abd distension: with concerns for ascitis on physical exam. -will get [name] abd and paracentesis  Code Status: DNR Family Communication: no family at bedside Disposition Plan: continue acute management in stepdown; remove PICC, continue IV broad spectrum antibiotics.   Consultants:  Cardiology   Procedures: ECHO: last Echo in May 2015 EF 15-25%, diffuse hypokinesis and mild mitral regurgitation  Antibiotics:  Cefepime  Vancomycin  HPI/Subjective: Alert, awake, oriented x3, in no acute distress; afebrile. reprots 4 liquid BM and is complaining of worsening abdominal distension. Breathing is ok and mainly impaired due to abd distension according to patient. Denies CP, Nausea and vomiting.  Objective: Filed Vitals:   03/18/14 0248 03/18/14 0400 03/18/14 0500 03/18/14 0736  BP:  103/62 97/44 98/52   Pulse:  96 88 95  Temp:  98.5 F (36.9 C)  97.8 F (36.6 C)  TempSrc:  Oral  Oral  Resp:  20 17  23  Height:      Weight:   75.7 kg (166 lb 14.2 oz)   SpO2: 95% 89% 94% 92%     Exam:  General: Alert, awake, oriented x3, in  no acute distress; afebrile. reports 4 liquid BM and is complaining of worsening abdominal distension. Breathing is ok and mainly impaired due to abd distension according to patient. Denies CP, Nausea and vomiting.  HEENT: No bruits, no goiter. Mild JVD Heart: regular rate, no rubs or gallops. Trace edema LE bilaterally, no cyanosis Lungs: decrease BS at bases bilaterally, no wheezing; positive scattered rhonchi.  Abdomen: Soft, mild tenderness on mid epigastric area, worsening distension. Positive fluid wave.  Skin: RUE is less erythematous, no signs of induration; PICC line removed. Neuro: Grossly intact, nonfocal.   Data Reviewed: Basic Metabolic Panel:  Recent Labs Lab 03/16/14 1615 03/17/14 0442 03/18/14 0318  NA 141 141 136*  K 3.8 3.7 4.2  CL 98 102 100  CO2 29 25 22   GLUCOSE 135* 141* 152*  BUN 27* 30* 35*  CREATININE 1.97* 2.19* 1.61*  CALCIUM 9.0 8.0* 8.4   Liver Function Tests:  Recent Labs Lab 03/16/14 1615 03/17/14 0442  AST 29 51*  ALT 18 31  ALKPHOS 107 106  BILITOT 0.9 0.7  PROT 7.3 6.2  ALBUMIN 3.4* 2.8*    Recent Labs Lab 03/16/14 2116  LIPASE 11   CBC:  Recent Labs Lab 03/16/14 1615 03/17/14 0442  WBC 25.7* 25.6*  NEUTROABS 24.4* 23.8*  HGB 11.9* 10.0*  HCT 35.2* 31.5*  MCV 94.1 95.2  PLT 281 235   BNP (last 3 results)  Recent Labs  11/07/13 0708 03/01/14 1959 03/08/14 2230  PROBNP 9356.0* 11880.0* 15334.0*   CBG:  Recent Labs Lab 03/17/14 0638 03/17/14 1156 03/17/14 1755 03/17/14 2343 03/18/14 0622  GLUCAP 128* 100* 190* 185* 130*    Recent Results (from the past 240 hour(s))  CULTURE, BLOOD (ROUTINE X 2)     Status: None   Collection Time    03/16/14  4:15 PM      Result Value Ref Range Status   Specimen Description BLOOD BLOOD LEFT FOREARM   Final   Special Requests BOTTLES DRAWN AEROBIC AND ANAEROBIC 10CC EACH   Final   Culture  Setup Time     Final   Value: 03/17/2014 00:28     Performed at [facility]   Culture     Final   Value: GRAM POSITIVE COCCI IN CLUSTERS     Note: Gram Stain Report Called to,Read Back By and Verified With: Valente David @ 10AM 9.28.15 DESIS Culture results may be compromised due to an excessive volume of blood received in culture bottles.     Performed at [facility]   Report Status PENDING   Incomplete  CULTURE, BLOOD (ROUTINE X 2)     Status: None   Collection Time    03/16/14  4:37 PM      Result Value Ref Range Status   Specimen Description BLOOD LEFT HAND   Final   Special Requests BOTTLES DRAWN AEROBIC AND ANAEROBIC [facility] EACH   Final   Culture  Setup Time     Final   Value: 03/17/2014 00:28     Performed at [facility]   Culture     Final   Value: GRAM POSITIVE COCCI IN CLUSTERS     Note: Gram Stain Report Called to,Read Back By and Verified With: Valente David @ 10AM 9.28.15  DESIS     Performed at [facility]   Report Status PENDING   Incomplete  MRSA PCR SCREENING     Status: None   Collection Time    03/17/14  6:22 AM      Result Value Ref Range Status   MRSA by PCR NEGATIVE  NEGATIVE Final   Comment:            The GeneXpert MRSA Assay (FDA     approved for NASAL specimens     only), is one component of a     comprehensive MRSA colonization     surveillance program. It is not     intended to diagnose MRSA     infection nor to guide or     monitor treatment for     MRSA infections.    Studies: Ct Abdomen Pelvis Wo Contrast  03/17/2014   CLINICAL DATA:  Abdominal pain.  Nausea and vomiting.  EXAM: CT ABDOMEN AND PELVIS WITHOUT CONTRAST  TECHNIQUE: Multidetector CT imaging of the abdomen and pelvis was performed following the standard protocol without IV contrast.  COMPARISON:  03/02/2014  FINDINGS: Technically limited study due to motion artifact. Infiltration and atelectasis in the lung bases. This is progressing since previous study. Changes may indicate pneumonia. Diffuse cardiac enlargement.  The unenhanced  appearance of the liver, spleen, gallbladder, pancreas, adrenal glands, inferior vena cava, and retroperitoneal lymph nodes is unremarkable. Diffuse calcification of the abdominal aorta with congenital small diameter. Exophytic lesions arising from both kidneys, largest on the left measuring 5.1 cm diameter. These are likely cysts. No hydronephrosis in either kidney. Heterogeneous fat density mass in the right side of the retroperitoneum posterior to the vena cava and adjacent to the psoas muscle. This lesion measures about 5.3 x 3.4 cm. Appearance suggests liposarcoma. Stomach, small bowel, and colon are not abnormally distended. Small esophageal hiatal hernia. No free air or free fluid in the abdomen.  Pelvis: Lipoma in the inferior left flank muscles. Prostate gland is enlarged. Bladder wall is not thickened. Appendix is not identified but no inflammatory changes are suggested in the right lower quadrant. No evidence of diverticulitis. Scattered diverticula are present in the sigmoid colon. Degenerative changes in the spine. No destructive bone lesions appreciated.  IMPRESSION: Increasing infiltration and atelectasis in the lung bases suggesting possible pneumonia. Otherwise no significant change since previous study. Again demonstrated is a heterogeneous fat density mass in the right retroperitoneum which may indicate liposarcoma. Additional incidental findings as discussed.   Electronically Signed   By: Lucienne Capers M.D.   On: 03/17/2014 03:51   Dg Chest Portable 1 View  03/16/2014   CLINICAL DATA:  Fever.  CHF.  Fluids given.  EXAM: PORTABLE CHEST - 1 VIEW  COMPARISON:  03/16/2014  FINDINGS: Cardiac pacemaker. Left PICC catheter is unchanged in position. Shallow inspiration. Cardiac enlargement with increased pulmonary vascularity and perihilar interstitial edema. Edema pattern is slightly progressed since previous study. No blunting of costophrenic angles. No pneumothorax.  IMPRESSION: Cardiac  enlargement with pulmonary vascular congestion and increasing interstitial edema since previous study.   Electronically Signed   By: Lucienne Capers M.D.   On: 03/16/2014 21:26   Dg Chest Port 1 View  03/16/2014   CLINICAL DATA:  Fever.  EXAM: PORTABLE CHEST - 1 VIEW  COMPARISON:  March 13, 2014.  FINDINGS: Stable cardiomegaly. Left-sided pacemaker is unchanged in position. Right-sided PICC line is noted with distal tip in the expected position the SVC. Mild central  pulmonary vascular congestion and perihilar edema is noted. No pneumothorax or pleural effusion is noted. Bony thorax is intact.  IMPRESSION: Mild central pulmonary vascular congestion and mild perihilar edema is noted bilaterally.   Electronically Signed   By: Sabino Dick M.D.   On: 03/16/2014 17:38   Dg Abd Portable 1v  03/16/2014   CLINICAL DATA:  Nausea/vomiting  EXAM: PORTABLE ABDOMEN - 1 VIEW  COMPARISON:  03/06/2014  FINDINGS: Nonspecific but nonobstructive bowel gas pattern.  Stomach is mild distended.  Degenerative changes of the lumbar spine.  IMPRESSION: Mild gastric distention.  Otherwise unremarkable abdominal radiograph.   Electronically Signed   By: Julian Hy M.D.   On: 03/16/2014 21:42    Scheduled Meds: . amiodarone  200 mg Oral BID  . atorvastatin  20 mg Oral q1800  . budesonide (PULMICORT) nebulizer solution  0.25 mg Nebulization BID  . ceFEPime (MAXIPIME) IV  1 g Intravenous Q24H  . feeding supplement (ENSURE COMPLETE)  237 mL Oral TID BM  . levalbuterol  0.63 mg Nebulization Q6H  . loratadine  10 mg Oral Daily  . magnesium oxide  400 mg Oral Daily  . potassium chloride SA  20 mEq Oral Daily  . sildenafil  20 mg Oral TID  . sodium chloride  3 mL Intravenous Q12H  . vancomycin  1,000 mg Intravenous Q24H  . warfarin  4 mg Oral ONCE-1800  . Warfarin - Pharmacist Dosing Inpatient   Does not apply q1800   Continuous Infusions: . milrinone 0.375 mcg/kg/min (03/18/14 0604)   Time: > 30  minutes  Tyler Christensen  [facility] Pager [phone]. If 8PM-8AM, please contact night-coverage at [email or web address], password [facility] 03/18/2014, 8:41 AM  LOS: 2 days

## 2014-03-19 DIAGNOSIS — J189 Pneumonia, unspecified organism: Secondary | ICD-10-CM

## 2014-03-19 DIAGNOSIS — R7881 Bacteremia: Secondary | ICD-10-CM

## 2014-03-19 DIAGNOSIS — A4901 Methicillin susceptible Staphylococcus aureus infection, unspecified site: Secondary | ICD-10-CM

## 2014-03-19 DIAGNOSIS — I059 Rheumatic mitral valve disease, unspecified: Secondary | ICD-10-CM

## 2014-03-19 DIAGNOSIS — A4101 Sepsis due to Methicillin susceptible Staphylococcus aureus: Secondary | ICD-10-CM

## 2014-03-19 LAB — BASIC METABOLIC PANEL
ANION GAP: 12 (ref 5–15)
BUN: 41 mg/dL — ABNORMAL HIGH (ref 6–23)
CO2: 26 meq/L (ref 19–32)
Calcium: 8.5 mg/dL (ref 8.4–10.5)
Chloride: 98 mEq/L (ref 96–112)
Creatinine, Ser: 1.54 mg/dL — ABNORMAL HIGH (ref 0.50–1.35)
GFR calc Af Amer: 48 mL/min — ABNORMAL LOW (ref >90)
GFR calc non Af Amer: 41 mL/min — ABNORMAL LOW (ref >90)
Glucose, Bld: 127 mg/dL — ABNORMAL HIGH (ref 70–99)
POTASSIUM: 4.5 meq/L (ref 3.7–5.3)
SODIUM: 136 meq/L — AB (ref 137–147)

## 2014-03-19 LAB — CBC WITH DIFFERENTIAL/PLATELET
BASOS PCT: 0 % (ref 0–1)
Basophils Absolute: 0 10*3/uL (ref 0.0–0.1)
Eosinophils Absolute: 0.2 10*3/uL (ref 0.0–0.7)
Eosinophils Relative: 2 % (ref 0–5)
HEMATOCRIT: 31.7 % — AB (ref 39.0–52.0)
HEMOGLOBIN: 10.2 g/dL — AB (ref 13.0–17.0)
LYMPHS PCT: 5 % — AB (ref 12–46)
Lymphs Abs: 0.7 10*3/uL (ref 0.7–4.0)
MCH: 31.4 pg (ref 26.0–34.0)
MCHC: 32.2 g/dL (ref 30.0–36.0)
MCV: 97.5 fL (ref 78.0–100.0)
MONO ABS: 1.3 10*3/uL — AB (ref 0.1–1.0)
MONOS PCT: 9 % (ref 3–12)
Neutro Abs: 11.8 10*3/uL — ABNORMAL HIGH (ref 1.7–7.7)
Neutrophils Relative %: 84 % — ABNORMAL HIGH (ref 43–77)
Platelets: 251 10*3/uL (ref 150–400)
RBC: 3.25 MIL/uL — ABNORMAL LOW (ref 4.22–5.81)
RDW: 15.6 % — ABNORMAL HIGH (ref 11.5–15.5)
WBC: 14 10*3/uL — ABNORMAL HIGH (ref 4.0–10.5)

## 2014-03-19 LAB — PROTIME-INR
INR: 1.86 — ABNORMAL HIGH (ref 0.00–1.49)
Prothrombin Time: 21.4 seconds — ABNORMAL HIGH (ref 11.6–15.2)

## 2014-03-19 LAB — GLUCOSE, CAPILLARY
GLUCOSE-CAPILLARY: 161 mg/dL — AB (ref 70–99)
Glucose-Capillary: 127 mg/dL — ABNORMAL HIGH (ref 70–99)
Glucose-Capillary: 132 mg/dL — ABNORMAL HIGH (ref 70–99)
Glucose-Capillary: 153 mg/dL — ABNORMAL HIGH (ref 70–99)
Glucose-Capillary: 220 mg/dL — ABNORMAL HIGH (ref 70–99)

## 2014-03-19 LAB — CULTURE, BLOOD (ROUTINE X 2)

## 2014-03-19 LAB — MAGNESIUM: Magnesium: 2.1 mg/dL (ref 1.5–2.5)

## 2014-03-19 MED ORDER — WARFARIN SODIUM 6 MG PO TABS
6.0000 mg | ORAL_TABLET | Freq: Once | ORAL | Status: AC
  Administered 2014-03-19: 6 mg via ORAL
  Filled 2014-03-19: qty 1

## 2014-03-19 MED ORDER — TORSEMIDE 20 MG PO TABS
20.0000 mg | ORAL_TABLET | Freq: Every day | ORAL | Status: DC
  Administered 2014-03-19: 20 mg via ORAL
  Filled 2014-03-19 (×2): qty 1

## 2014-03-19 NOTE — Progress Notes (Signed)
ANTICOAGULATION/ANTIBIOTIC CONSULT NOTE - Follow Up Consult  Pharmacy Consult for Coumadin and Cefepime Indication: atrial fibrillation and bacteremia  Allergies  Allergen Reactions  . Lisinopril Rash    Patient Measurements: Height: 5\' 8"  (172.7 cm) Weight: 169 lb 1.5 oz (76.7 kg) IBW/kg (Calculated) : 68.4  Vital Signs: Temp: 98.6 F (37 C) (09/30 1200) Temp src: Oral (09/30 1200) BP: 97/54 mmHg (09/30 1200) Pulse Rate: 89 (09/30 1200)  Labs:  Recent Labs  03/16/14 1615  03/17/14 0442 03/18/14 0318 03/19/14 0343  HGB 11.9*  --  10.0*  --  10.2*  HCT 35.2*  --  31.5*  --  31.7*  PLT 281  --  235  --  251  LABPROT  --   < > 41.4* 30.3* 21.4*  INR  --   < > 4.32* 2.90* 1.86*  CREATININE 1.97*  --  2.19* 1.61* 1.54*  < > = values in this interval not displayed.  Estimated Creatinine Clearance: 37.6 ml/min (by C-G formula based on Cr of 1.54).  Assessment: 81yom continues on coumadin for afib. INR was elevated on admit and he received vitamin k 2.5mg  x 1. INR down to 2.9 yesterday and coumadin resumed. INR continues to fall today to 1.86. Will probably require higher doses initially to overcome vitamin k resistance. Home dose: 4mg  daily except 2mg  Friday and Sunday  Also continue on day #3 vancomycin and cefepime for staph bacteremia. Cultures have grown out MSSA so ID plans to d/c vanc, continue cefepime through day #8 and then switch to ancef. Renal function is improving - cefepime dose is appropriate.  9/27 Vanc>>9/30 9/27 Cefepime>>  9/27 blood x2>> MSSA 9/27 urine>>negative 9/29 blood x1>>ngtd  Goal of Therapy:  INR 2-3 Monitor platelets by anticoagulation protocol: Yes   Plan:  1) Continue cefepime 1g IV q24 2) Increase coumadin to 6mg  x 1   Deboraha Sprang 03/19/2014,1:56 PM

## 2014-03-19 NOTE — Progress Notes (Signed)
[facility] PROGRESS NOTE  Tyler Christensen GMW:[id number] DOB: September 01, 1932 DOA: 03/16/2014 PCP: Harvie Junior, MD  Assessment/Plan: #1 severe sepsis Multifactorial secondary to MSSA bacteremia and probable HCAP. repeat blood cultures pending. Initial PICC line was discontinued on 03/17/2014. Sensitivities c/w MSSA. Patient is currently afebrile. Continue empiric IV vancomycin IV cefepime. Continue flutter valve. Continue Pulmicort, scheduled nebulizers. ID following and appreciate input and recommendations.  #2 MSSA bacteremia Likely secondary to PICC line infection. PICC line was discontinued 03/17/2014. Repeat blood cultures pending. Once repeat blood cultures are negative and final and continued treatment is desired will need to place another PICC line at that time. ID following and appreciate input and recommendations. Patient is too tenuous for TEE. Could possibly narrowed on antibiotics however will defer to ID.  #3 probable HCAP Afebrile. WBC is trending down. Blood cultures positive for MSSA bacteremia. Continue empiric IV vancomycin and IV cefepime. ID following.  #4 acute respiratory failure with hypoxia Secondary to acute on chronic CHF exacerbation and normal HCAP. currently afebrile. Patient currently on milnirone. Torsemide resume today per heart failure team. Continue empiric IV vancomycin IV cefepime. Follow.  #5 acute on chronic systolic heart failure Patient's current weight is 169 pounds. Discharge weight on 9/18 was 160 pounds. I/O = +365/24hrs. Patient currently on milrinone. Torsemide resume today per heart failure team. Heart failure team following and appreciate input and recommendations.  #6 nausea and emesis Cardiac enzymes negative. Continue full liquid diet. Anti-emetics as needed.  #7 chronic kidney disease stage III Elevated creatinine was likely secondary to prerenal azotemia secondary to hypotension and decreased perfusion. Diuretics were held. Renal  function is improved and trended down to 1.54 today. Torsemide has been resumed per heart failure team. Follow renal function closely.  #8 coagulopathy Status post reversal with vitamin K and holding Coumadin. INR now at 1.86. Coumadin dosing per pharmacy.  #9 paroxysmal atrial fibrillation Currently rate controlled on amiodarone. INR is 1.86. Coumadin per pharmacy.  #10 pulmonary hypertension Continue slidenafil.  #11 mild protein calorie malnutrition Continue ensure.  #12 diarrhea C. difficile PCR pending. WBC trending down. Abdomen was distended however no ascites noted on abdominal ultrasound. Supportive care.  #13 abdominal distention Abdominal ultrasound negative for ascites and a such paracentesis was canceled. Follow.  #14 prophylaxis SCDs until INR is greater than 2 then Coumadin for DVT prophylaxis.  Code Status: Full Family Communication: Updated patient no family at bedside. Disposition Plan: Pending meeting with heart failure team, Dr. Haroldine Laws and and Dr. Olevia Bowens to discuss possible palliative/hospice.   Consultants:  Cardiology: Dr. Wynonia Lawman 03/16/2014  Infectious diseases: Dr. Johnnye Sima 03/18/2014  Procedures: ECHO: last Echo in May 2015  EF 15-25%, diffuse hypokinesis and mild mitral regurgitation Abdominal ultrasound 03/18/2014  Removal 03/17/2014   Antibiotics:  IV vancomycin 09/28/20515  IV cefepime 03/17/2014  HPI/Subjective: Patient states some SOB. Some nausea, no emesis.  Objective: Filed Vitals:   03/19/14 0800  BP: 102/54  Pulse: 91  Temp: 98.2 F (36.8 C)  Resp: 26    Intake/Output Summary (Last 24 hours) at 03/19/14 0954 Last data filed at 03/19/14 0814  Gross per 24 hour  Intake    675 ml  Output    750 ml  Net    -75 ml   Filed Weights   03/17/14 0604 03/18/14 0500 03/19/14 0500  Weight: 75.3 kg (166 lb 0.1 oz) 75.7 kg (166 lb 14.2 oz) 76.7 kg (169 lb 1.5 oz)    Exam:   General:  NAD  Cardiovascular:  RRR  Respiratory: CTAB with minimal wheezing  Abdomen: soft, distended, +BS, NT  Musculoskeletal: No c/c/ 2-3 + BLE edema  Data Reviewed: Basic Metabolic Panel:  Recent Labs Lab 03/16/14 1615 03/17/14 0442 03/18/14 0318 03/19/14 0343  NA 141 141 136* 136*  K 3.8 3.7 4.2 4.5  CL 98 102 100 98  CO2 29 25 22 26   GLUCOSE 135* 141* 152* 127*  BUN 27* 30* 35* 41*  CREATININE 1.97* 2.19* 1.61* 1.54*  CALCIUM 9.0 8.0* 8.4 8.5  MG  --   --   --  2.1   Liver Function Tests:  Recent Labs Lab 03/16/14 1615 03/17/14 0442  AST 29 51*  ALT 18 31  ALKPHOS 107 106  BILITOT 0.9 0.7  PROT 7.3 6.2  ALBUMIN 3.4* 2.8*    Recent Labs Lab 03/16/14 2116  LIPASE 11   No results found for this basename: AMMONIA,  in the last 168 hours CBC:  Recent Labs Lab 03/16/14 1615 03/17/14 0442 03/19/14 0343  WBC 25.7* 25.6* 14.0*  NEUTROABS 24.4* 23.8* 11.8*  HGB 11.9* 10.0* 10.2*  HCT 35.2* 31.5* 31.7*  MCV 94.1 95.2 97.5  PLT 281 235 251   Cardiac Enzymes: No results found for this basename: CKTOTAL, CKMB, CKMBINDEX, TROPONINI,  in the last 168 hours BNP (last 3 results)  Recent Labs  11/07/13 0708 03/01/14 1959 03/08/14 2230  PROBNP 9356.0* 11880.0* 15334.0*   CBG:  Recent Labs Lab 03/18/14 1141 03/18/14 1159 03/18/14 1610 03/19/14 0002 03/19/14 0641  GLUCAP 77 168* 163* 220* 132*    Recent Results (from the past 240 hour(s))  CULTURE, BLOOD (ROUTINE X 2)     Status: None   Collection Time    03/16/14  4:15 PM      Result Value Ref Range Status   Specimen Description BLOOD BLOOD LEFT FOREARM   Final   Special Requests BOTTLES DRAWN AEROBIC AND ANAEROBIC 10CC EACH   Final   Culture  Setup Time     Final   Value: 03/17/2014 00:28     Performed at [facility]   Culture     Final   Value: STAPHYLOCOCCUS AUREUS     Note: RIFAMPIN AND GENTAMICIN SHOULD NOT BE USED AS SINGLE DRUGS FOR TREATMENT OF STAPH INFECTIONS.     Note: Gram Stain Report Called  to,Read Back By and Verified With: J. Alvera Singh @ 10AM 9.28.15 DESIS Culture results may be compromised due to an excessive volume of blood received in culture bottles.     Performed at [facility]   Report Status 03/19/2014 FINAL   Final   Organism ID, Bacteria STAPHYLOCOCCUS AUREUS   Final  CULTURE, BLOOD (ROUTINE X 2)     Status: None   Collection Time    03/16/14  4:37 PM      Result Value Ref Range Status   Specimen Description BLOOD LEFT HAND   Final   Special Requests BOTTLES DRAWN AEROBIC AND ANAEROBIC [facility] EACH   Final   Culture  Setup Time     Final   Value: 03/17/2014 00:28     Performed at [facility]   Culture     Final   Value: STAPHYLOCOCCUS AUREUS     Note: SUSCEPTIBILITIES PERFORMED ON PREVIOUS CULTURE WITHIN THE LAST 5 DAYS.     Note: Gram Stain Report Called to,Read Back By and Verified With: Valente David @ 10AM 9.28.15 DESIS     Performed at [facility]  Report Status 03/19/2014 FINAL   Final  URINE CULTURE     Status: None   Collection Time    03/16/14  4:59 PM      Result Value Ref Range Status   Specimen Description URINE, CLEAN CATCH   Final   Special Requests NONE   Final   Culture  Setup Time     Final   Value: 03/17/2014 01:31     Performed at [place]     Final   Value: NO GROWTH     Performed at [facility]   Culture     Final   Value: NO GROWTH     Performed at [facility]   Report Status 03/18/2014 FINAL   Final  MRSA PCR SCREENING     Status: None   Collection Time    03/17/14  6:22 AM      Result Value Ref Range Status   MRSA by PCR NEGATIVE  NEGATIVE Final   Comment:            The GeneXpert MRSA Assay (FDA     approved for NASAL specimens     only), is one component of a     comprehensive MRSA colonization     surveillance program. It is not     intended to diagnose MRSA     infection nor to guide or     monitor treatment for     MRSA infections.  CULTURE, BLOOD  (ROUTINE X 2)     Status: None   Collection Time    03/18/14  4:50 PM      Result Value Ref Range Status   Specimen Description BLOOD RIGHT ARM   Final   Special Requests BOTTLES DRAWN AEROBIC ONLY 2CC   Final   Culture  Setup Time     Final   Value: 03/18/2014 22:28     Performed at [facility]   Culture     Final   Value:        BLOOD CULTURE RECEIVED NO GROWTH TO DATE CULTURE WILL BE HELD FOR 5 DAYS BEFORE ISSUING A FINAL NEGATIVE REPORT     Performed at [facility]   Report Status PENDING   Incomplete     Studies: US Abdomen Limited  03/18/2014   CLINICAL DATA:  80 year old male with abdominal distension and clinical suggestion of ascites. Staph bacteremia.  EXAM: LIMITED ABDOMEN ULTRASOUND FOR ASCITES  TECHNIQUE: Limited ultrasound survey for ascites was performed in all four abdominal quadrants.  COMPARISON:  03/02/2014 CT  FINDINGS: There is no evidence of ascites.  IMPRESSION: No evidence of ascites.   Electronically Signed   By: Hassan Rowan M.D.   On: 03/18/2014 15:31    Scheduled Meds: . amiodarone  200 mg Oral BID  . atorvastatin  20 mg Oral q1800  . budesonide (PULMICORT) nebulizer solution  0.25 mg Nebulization BID  . ceFEPime (MAXIPIME) IV  1 g Intravenous Q24H  . feeding supplement (ENSURE COMPLETE)  237 mL Oral TID BM  . levalbuterol  0.63 mg Nebulization Q6H  . loratadine  10 mg Oral Daily  . magnesium oxide  400 mg Oral Daily  . potassium chloride SA  20 mEq Oral Daily  . sildenafil  20 mg Oral TID  . sodium chloride  3 mL Intravenous Q12H  . torsemide  20 mg Oral Daily  . vancomycin  1,000 mg Intravenous Q24H  . Warfarin -  Pharmacist Dosing Inpatient   Does not apply q1800   Continuous Infusions: . milrinone 0.375 mcg/kg/min (03/19/14 0844)    Principal Problem:   Severe sepsis Active Problems:   MSSA (methicillin susceptible Staphylococcus aureus) septicemia/MSSA bacteremia   Acute on chronic systolic heart failure   Acute respiratory  failure with hypoxia   HTN (hypertension)   Atrial fibrillation   HLD (hyperlipidemia)   Long-term (current) use of anticoagulants   ICD (implantable cardioverter-defibrillator), biventricular, in situ   CKD (chronic kidney disease) stage 3, GFR 30-59 ml/min   Sepsis   Nausea & vomiting   HCAP (healthcare-associated pneumonia)    Time spent: 93 mins    [facility] MD [facility] Pager [phone]. If 7PM-7AM, please contact night-coverage at [email or web address], password [email or web address] 03/19/2014, 9:54 AM  LOS: 3 days

## 2014-03-19 NOTE — Progress Notes (Signed)
INFECTIOUS DISEASE PROGRESS NOTE  ID: Tyler Christensen is a 80 y.o. male with  Principal Problem:   Severe sepsis Active Problems:   HTN (hypertension)   Atrial fibrillation   HLD (hyperlipidemia)   Long-term (current) use of anticoagulants   ICD (implantable cardioverter-defibrillator), biventricular, in situ   Acute on chronic systolic heart failure   CKD (chronic kidney disease) stage 3, GFR 30-59 ml/min   Sepsis   Acute respiratory failure with hypoxia   Nausea & vomiting   MSSA (methicillin susceptible Staphylococcus aureus) septicemia/MSSA bacteremia   HCAP (healthcare-associated pneumonia)  Subjective: Eating well, c/o hunger.  Abtx:  Anti-infectives   Start     Dose/Rate Route Frequency Ordered Stop   03/17/14 1600  vancomycin (VANCOCIN) IVPB 1000 mg/200 mL premix     1,000 mg 200 mL/hr over 60 Minutes Intravenous Every 24 hours 03/16/14 1719     03/17/14 1600  ceFEPIme (MAXIPIME) 1 g in dextrose 5 % 50 mL IVPB     1 g 100 mL/hr over 30 Minutes Intravenous Every 24 hours 03/16/14 1719     03/16/14 1615  vancomycin (VANCOCIN) 1,250 mg in sodium chloride 0.9 % 250 mL IVPB     1,250 mg 166.7 mL/hr over 90 Minutes Intravenous  Once 03/16/14 1609 03/16/14 1826   03/16/14 1615  ceFEPIme (MAXIPIME) 1 g in dextrose 5 % 50 mL IVPB     1 g 100 mL/hr over 30 Minutes Intravenous  Once 03/16/14 1609 03/16/14 1748      Medications:  Scheduled: . amiodarone  200 mg Oral BID  . atorvastatin  20 mg Oral q1800  . budesonide (PULMICORT) nebulizer solution  0.25 mg Nebulization BID  . ceFEPime (MAXIPIME) IV  1 g Intravenous Q24H  . feeding supplement (ENSURE COMPLETE)  237 mL Oral TID BM  . levalbuterol  0.63 mg Nebulization Q6H  . loratadine  10 mg Oral Daily  . magnesium oxide  400 mg Oral Daily  . potassium chloride SA  20 mEq Oral Daily  . sildenafil  20 mg Oral TID  . sodium chloride  3 mL Intravenous Q12H  . torsemide  20 mg Oral Daily  . vancomycin  1,000 mg  Intravenous Q24H  . Warfarin - Pharmacist Dosing Inpatient   Does not apply q1800    Objective: Vital signs in last 24 hours: Temp:  [98 F (36.7 C)-98.6 F (37 C)] 98.6 F (37 C) (09/30 1200) Pulse Rate:  [88-100] 89 (09/30 1200) Resp:  [21-29] 21 (09/30 1200) BP: (96-119)/(46-62) 97/54 mmHg (09/30 1200) SpO2:  [85 %-95 %] 95 % (09/30 1200) FiO2 (%):  [2 %] 2 % (09/30 1200) Weight:  [76.7 kg (169 lb 1.5 oz)] 76.7 kg (169 lb 1.5 oz) (09/30 0500)   General appearance: alert, cooperative and no distress Resp: clear to auscultation bilaterally Cardio: regular rate and rhythm GI: normal findings: bowel sounds normal and soft, non-tender and abnormal findings:  distended  Lab Results  Recent Labs  03/17/14 0442 03/18/14 0318 03/19/14 0343  WBC 25.6*  --  14.0*  HGB 10.0*  --  10.2*  HCT 31.5*  --  31.7*  NA 141 136* 136*  K 3.7 4.2 4.5  CL 102 100 98  CO2 25 22 26   BUN 30* 35* 41*  CREATININE 2.19* 1.61* 1.54*   Liver Panel  Recent Labs  03/16/14 1615 03/17/14 0442  PROT 7.3 6.2  ALBUMIN 3.4* 2.8*  AST 29 51*  ALT 18 31  ALKPHOS 107  106  BILITOT 0.9 0.7   Sedimentation Rate No results found for this basename: ESRSEDRATE,  in the last 72 hours C-Reactive Protein No results found for this basename: CRP,  in the last 72 hours  Microbiology: Recent Results (from the past 240 hour(s))  CULTURE, BLOOD (ROUTINE X 2)     Status: None   Collection Time    03/16/14  4:15 PM      Result Value Ref Range Status   Specimen Description BLOOD BLOOD LEFT FOREARM   Final   Special Requests BOTTLES DRAWN AEROBIC AND ANAEROBIC 10CC EACH   Final   Culture  Setup Time     Final   Value: 03/17/2014 00:28     Performed at [facility]   Culture     Final   Value: STAPHYLOCOCCUS AUREUS     Note: RIFAMPIN AND GENTAMICIN SHOULD NOT BE USED AS SINGLE DRUGS FOR TREATMENT OF STAPH INFECTIONS.     Note: Gram Stain Report Called to,Read Back By and Verified With: J. Alvera Singh  @ 10AM 9.28.15 DESIS Culture results may be compromised due to an excessive volume of blood received in culture bottles.     Performed at [facility]   Report Status 03/19/2014 FINAL   Final   Organism ID, Bacteria STAPHYLOCOCCUS AUREUS   Final  CULTURE, BLOOD (ROUTINE X 2)     Status: None   Collection Time    03/16/14  4:37 PM      Result Value Ref Range Status   Specimen Description BLOOD LEFT HAND   Final   Special Requests BOTTLES DRAWN AEROBIC AND ANAEROBIC [facility] EACH   Final   Culture  Setup Time     Final   Value: 03/17/2014 00:28     Performed at [facility]   Culture     Final   Value: STAPHYLOCOCCUS AUREUS     Note: SUSCEPTIBILITIES PERFORMED ON PREVIOUS CULTURE WITHIN THE LAST 5 DAYS.     Note: Gram Stain Report Called to,Read Back By and Verified With: Valente David @ 10AM 9.28.15 DESIS     Performed at [facility]   Report Status 03/19/2014 FINAL   Final  URINE CULTURE     Status: None   Collection Time    03/16/14  4:59 PM      Result Value Ref Range Status   Specimen Description URINE, CLEAN CATCH   Final   Special Requests NONE   Final   Culture  Setup Time     Final   Value: 03/17/2014 01:31     Performed at [place]     Final   Value: NO GROWTH     Performed at [facility]   Culture     Final   Value: NO GROWTH     Performed at [facility]   Report Status 03/18/2014 FINAL   Final  MRSA PCR SCREENING     Status: None   Collection Time    03/17/14  6:22 AM      Result Value Ref Range Status   MRSA by PCR NEGATIVE  NEGATIVE Final   Comment:            The GeneXpert MRSA Assay (FDA     approved for NASAL specimens     only), is one component of a     comprehensive MRSA colonization     surveillance program. It is not  intended to diagnose MRSA     infection nor to guide or     monitor treatment for     MRSA infections.  CULTURE, BLOOD (ROUTINE X 2)     Status: None   Collection Time      03/18/14  4:50 PM      Result Value Ref Range Status   Specimen Description BLOOD RIGHT ARM   Final   Special Requests BOTTLES DRAWN AEROBIC ONLY 2CC   Final   Culture  Setup Time     Final   Value: 03/18/2014 22:28     Performed at [facility]   Culture     Final   Value:        BLOOD CULTURE RECEIVED NO GROWTH TO DATE CULTURE WILL BE HELD FOR 5 DAYS BEFORE ISSUING A FINAL NEGATIVE REPORT     Performed at [facility]   Report Status PENDING   Incomplete    Studies/Results: US Abdomen Limited  03/18/2014   CLINICAL DATA:  80 year old male with abdominal distension and clinical suggestion of ascites. Staph bacteremia.  EXAM: LIMITED ABDOMEN ULTRASOUND FOR ASCITES  TECHNIQUE: Limited ultrasound survey for ascites was performed in all four abdominal quadrants.  COMPARISON:  03/02/2014 CT  FINDINGS: There is no evidence of ascites.  IMPRESSION: No evidence of ascites.   Electronically Signed   By: Hassan Rowan M.D.   On: 03/18/2014 15:31     Assessment/Plan: Staph bacteremia  CHF (home milrinone)  CKD III  R retroperitoneal mass  Total days of antibiotics: 3 vanco/cefepime Afebrile, WBC improving. Cr improved as well.  Stop vanco Check TTE Would stop cefepime at day 8, change to ancef Provided his TTE does not show abnormality (PLEASE CALL IF IT DOES), plan to give him 2 weeks of anbx with repeat BCx 1 week after he completes his therapy.  Available if questions         [name] Infectious Diseases (pager) [phone] [email or web address] 03/19/2014, 1:22 PM  LOS: 3 days

## 2014-03-19 NOTE — Progress Notes (Signed)
Advanced Heart Failure Rounding Note   Subjective:    Tyler Christensen is a 80 y.o. male w/ PMHx significant for NICM with EF of 20% on chronic milrinone, HTN, pulm HTN, chronic renal insufficiency (baseline CR 1.8), retroperitoneal mass and PAF.  Just recently discharged 9/18 after AKI. Discharged weight was 160 lbs. Called last week about PICC line being out 7 cm and was scheduled for PICC exchange which he underwent (9/24).  Presented to the ED 9/27 with fevers 103 and erythema around PICC. Concern for sepsis and PICC exchanged. CT abdomen (9/28): increasing infiltration and atelectasis in the lung bases possible PNA.   PICC discontinued 9/27 d/t blood cultures gram + cocci in clusters in both sets. Remains on milrinone 0.375 mcg. Renal function trending down.  Diuretics on hold. CT with LLL PNA.   Feels better. Now afebrile. Bcx 2/2 with MSSA. Weight climbing up slowly. Ab u/s without ascites.    Objective:   Weight Range:  Vital Signs:   Temp:  [97.6 F (36.4 C)-98.6 F (37 C)] 98.2 F (36.8 C) (09/30 0800) Pulse Rate:  [88-100] 91 (09/30 0800) Resp:  [23-29] 26 (09/30 0800) BP: (96-119)/(43-62) 102/54 mmHg (09/30 0800) SpO2:  [89 %-95 %] 95 % (09/30 0800) Weight:  [76.7 kg (169 lb 1.5 oz)] 76.7 kg (169 lb 1.5 oz) (09/30 0500) Last BM Date: 03/17/14  Weight change: Filed Weights   03/17/14 0604 03/18/14 0500 03/19/14 0500  Weight: 75.3 kg (166 lb 0.1 oz) 75.7 kg (166 lb 14.2 oz) 76.7 kg (169 lb 1.5 oz)    Intake/Output:   Intake/Output Summary (Last 24 hours) at 03/19/14 0913 Last data filed at 03/19/14 0814  Gross per 24 hour  Intake    915 ml  Output    750 ml  Net    165 ml     Physical Exam: General:  Elderly appearing. No resp difficulty, on 2L [place] HEENT: normal Neck: supple. JVP 7-8 . Carotids 2+ bilat; no bruits. No lymphadenopathy or thryomegaly appreciated. Cor: PMI laterally discplaced. Distant regular Lungs: diminished in the bases. Abdomen: tender, +++  distended. No hepatosplenomegaly. No bruits or masses. Good bowel sounds. Extremities: no cyanosis, clubbing, rash, edema, R arm erythema and warm to touch and edematous Neuro: alert & orientedx3, cranial nerves grossly intact. moves all 4 extremities w/o difficulty. Affect pleasant  Telemetry: V paced 90s  Labs: Basic Metabolic Panel:  Recent Labs Lab 03/16/14 1615 03/17/14 0442 03/18/14 0318 03/19/14 0343  NA 141 141 136* 136*  K 3.8 3.7 4.2 4.5  CL 98 102 100 98  CO2 29 25 22 26   GLUCOSE 135* 141* 152* 127*  BUN 27* 30* 35* 41*  CREATININE 1.97* 2.19* 1.61* 1.54*  CALCIUM 9.0 8.0* 8.4 8.5  MG  --   --   --  2.1    Liver Function Tests:  Recent Labs Lab 03/16/14 1615 03/17/14 0442  AST 29 51*  ALT 18 31  ALKPHOS 107 106  BILITOT 0.9 0.7  PROT 7.3 6.2  ALBUMIN 3.4* 2.8*    Recent Labs Lab 03/16/14 2116  LIPASE 11   No results found for this basename: AMMONIA,  in the last 168 hours  CBC:  Recent Labs Lab 03/16/14 1615 03/17/14 0442 03/19/14 0343  WBC 25.7* 25.6* 14.0*  NEUTROABS 24.4* 23.8* 11.8*  HGB 11.9* 10.0* 10.2*  HCT 35.2* 31.5* 31.7*  MCV 94.1 95.2 97.5  PLT 281 235 251    Cardiac Enzymes: No results found for this  basename: CKTOTAL, CKMB, CKMBINDEX, TROPONINI,  in the last 168 hours  BNP: BNP (last 3 results)  Recent Labs  11/07/13 0708 03/01/14 1959 03/08/14 2230  PROBNP 9356.0* 11880.0* 15334.0*     Other results:  EKG:   Imaging: US Abdomen Limited  03/18/2014   CLINICAL DATA:  80 year old male with abdominal distension and clinical suggestion of ascites. Staph bacteremia.  EXAM: LIMITED ABDOMEN ULTRASOUND FOR ASCITES  TECHNIQUE: Limited ultrasound survey for ascites was performed in all four abdominal quadrants.  COMPARISON:  03/02/2014 CT  FINDINGS: There is no evidence of ascites.  IMPRESSION: No evidence of ascites.   Electronically Signed   By: Hassan Rowan M.D.   On: 03/18/2014 15:31     Medications:      Scheduled Medications: . amiodarone  200 mg Oral BID  . atorvastatin  20 mg Oral q1800  . budesonide (PULMICORT) nebulizer solution  0.25 mg Nebulization BID  . ceFEPime (MAXIPIME) IV  1 g Intravenous Q24H  . feeding supplement (ENSURE COMPLETE)  237 mL Oral TID BM  . levalbuterol  0.63 mg Nebulization Q6H  . loratadine  10 mg Oral Daily  . magnesium oxide  400 mg Oral Daily  . potassium chloride SA  20 mEq Oral Daily  . sildenafil  20 mg Oral TID  . sodium chloride  3 mL Intravenous Q12H  . vancomycin  1,000 mg Intravenous Q24H  . Warfarin - Pharmacist Dosing Inpatient   Does not apply q1800    Infusions: . 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 Macaraeg is well known to the HF team and was just admitted for AKI and abdominal distention. He went home on 9/18 and called the HF clinic last week d/t PICC being out 7 cm. Underwent exchange 9/24 and subsequently presented to the ED on 9/27 with fevers and erythema around PICC line. Bcx 2/2 MSSA. He also has PNA noted on CT.   Bcx 2/2 for MSSA. ID has seen. Infectious symptoms improving. Renal function better. He remains very weak. No appetite. I discussed his situation with him through Dr. Venetia Constable on the phone. Mr. Herendeen says his functional capacity at home is very limited and he is worried he is going to die. He does not want to go back home at this point.  Dr. Venetia Constable will be back in the hospital tomorrow and we have decided that we will all meet together in person and talk about options. [place]  may be option - though he would need discontinuation of milrinone .  Agree with ID. He is too tenuous for TEE currently. Will restart torsemide carefully. Continue broad spectrum abx and milrinone.   Glori Bickers  MD   03/19/2014

## 2014-03-19 NOTE — Progress Notes (Signed)
  Echocardiogram 2D Echocardiogram has been performed.  Tyler Christensen Tyler Christensen 03/19/2014, 10:03 AM

## 2014-03-20 ENCOUNTER — Inpatient Hospital Stay (HOSPITAL_COMMUNITY): Payer: Medicare Other

## 2014-03-20 DIAGNOSIS — A4101 Sepsis due to Methicillin susceptible Staphylococcus aureus: Principal | ICD-10-CM

## 2014-03-20 DIAGNOSIS — J9601 Acute respiratory failure with hypoxia: Secondary | ICD-10-CM

## 2014-03-20 DIAGNOSIS — N183 Chronic kidney disease, stage 3 (moderate): Secondary | ICD-10-CM

## 2014-03-20 DIAGNOSIS — I5021 Acute systolic (congestive) heart failure: Secondary | ICD-10-CM

## 2014-03-20 DIAGNOSIS — R1900 Intra-abdominal and pelvic swelling, mass and lump, unspecified site: Secondary | ICD-10-CM

## 2014-03-20 DIAGNOSIS — R6520 Severe sepsis without septic shock: Secondary | ICD-10-CM

## 2014-03-20 DIAGNOSIS — B9561 Methicillin susceptible Staphylococcus aureus infection as the cause of diseases classified elsewhere: Secondary | ICD-10-CM

## 2014-03-20 DIAGNOSIS — I270 Primary pulmonary hypertension: Secondary | ICD-10-CM

## 2014-03-20 DIAGNOSIS — R7881 Bacteremia: Secondary | ICD-10-CM

## 2014-03-20 DIAGNOSIS — A419 Sepsis, unspecified organism: Secondary | ICD-10-CM

## 2014-03-20 LAB — GLUCOSE, CAPILLARY
GLUCOSE-CAPILLARY: 119 mg/dL — AB (ref 70–99)
Glucose-Capillary: 121 mg/dL — ABNORMAL HIGH (ref 70–99)

## 2014-03-20 LAB — CBC WITH DIFFERENTIAL/PLATELET
Basophils Absolute: 0 10*3/uL (ref 0.0–0.1)
Basophils Relative: 0 % (ref 0–1)
Eosinophils Absolute: 0.2 10*3/uL (ref 0.0–0.7)
Eosinophils Relative: 1 % (ref 0–5)
HCT: 30.4 % — ABNORMAL LOW (ref 39.0–52.0)
Hemoglobin: 9.9 g/dL — ABNORMAL LOW (ref 13.0–17.0)
LYMPHS ABS: 1 10*3/uL (ref 0.7–4.0)
LYMPHS PCT: 8 % — AB (ref 12–46)
MCH: 30.7 pg (ref 26.0–34.0)
MCHC: 32.6 g/dL (ref 30.0–36.0)
MCV: 94.1 fL (ref 78.0–100.0)
Monocytes Absolute: 1.1 10*3/uL — ABNORMAL HIGH (ref 0.1–1.0)
Monocytes Relative: 8 % (ref 3–12)
Neutro Abs: 10.5 10*3/uL — ABNORMAL HIGH (ref 1.7–7.7)
Neutrophils Relative %: 83 % — ABNORMAL HIGH (ref 43–77)
PLATELETS: 266 10*3/uL (ref 150–400)
RBC: 3.23 MIL/uL — AB (ref 4.22–5.81)
RDW: 15.6 % — ABNORMAL HIGH (ref 11.5–15.5)
WBC: 12.8 10*3/uL — AB (ref 4.0–10.5)

## 2014-03-20 LAB — BASIC METABOLIC PANEL
Anion gap: 13 (ref 5–15)
BUN: 41 mg/dL — ABNORMAL HIGH (ref 6–23)
CO2: 27 mEq/L (ref 19–32)
Calcium: 8.4 mg/dL (ref 8.4–10.5)
Chloride: 98 mEq/L (ref 96–112)
Creatinine, Ser: 1.53 mg/dL — ABNORMAL HIGH (ref 0.50–1.35)
GFR, EST AFRICAN AMERICAN: 48 mL/min — AB (ref >90)
GFR, EST NON AFRICAN AMERICAN: 42 mL/min — AB (ref >90)
Glucose, Bld: 146 mg/dL — ABNORMAL HIGH (ref 70–99)
POTASSIUM: 4.7 meq/L (ref 3.7–5.3)
SODIUM: 138 meq/L (ref 137–147)

## 2014-03-20 LAB — CLOSTRIDIUM DIFFICILE BY PCR: Toxigenic C. Difficile by PCR: NEGATIVE

## 2014-03-20 LAB — PROTIME-INR
INR: 2.24 — AB (ref 0.00–1.49)
Prothrombin Time: 24.8 seconds — ABNORMAL HIGH (ref 11.6–15.2)

## 2014-03-20 MED ORDER — TORSEMIDE 20 MG PO TABS
20.0000 mg | ORAL_TABLET | Freq: Two times a day (BID) | ORAL | Status: DC
  Administered 2014-03-20 – 2014-03-21 (×3): 20 mg via ORAL
  Filled 2014-03-20 (×5): qty 1

## 2014-03-20 MED ORDER — WARFARIN SODIUM 4 MG PO TABS
4.0000 mg | ORAL_TABLET | Freq: Once | ORAL | Status: AC
  Administered 2014-03-20: 4 mg via ORAL
  Filled 2014-03-20: qty 1

## 2014-03-20 MED ORDER — SODIUM CHLORIDE 0.9 % IV SOLN
INTRAVENOUS | Status: DC
  Administered 2014-03-25 – 2014-03-31 (×5): via INTRAVENOUS

## 2014-03-20 NOTE — Progress Notes (Signed)
ANTICOAGULATION/ANTIBIOTIC CONSULT NOTE - Follow Up Consult  Pharmacy Consult for Coumadin and Cefepime Indication: atrial fibrillation and bacteremia  Allergies  Allergen Reactions  . Lisinopril Rash    Patient Measurements: Height: 5\' 8"  (172.7 cm) Weight: 170 lb 3.1 oz (77.2 kg) IBW/kg (Calculated) : 68.4  Vital Signs: Temp: 97.9 F (36.6 C) (10/01 0849) Temp src: Oral (10/01 0447) BP: 96/52 mmHg (10/01 0849) Pulse Rate: 84 (10/01 0400)  Labs:  Recent Labs  03/18/14 0318 03/19/14 0343 03/20/14 0330  HGB  --  10.2* 9.9*  HCT  --  31.7* 30.4*  PLT  --  251 266  LABPROT 30.3* 21.4* 24.8*  INR 2.90* 1.86* 2.24*  CREATININE 1.61* 1.54* 1.53*    Estimated Creatinine Clearance: 37.9 ml/min (by C-G formula based on Cr of 1.53).  Assessment: 81yom continues on coumadin for afib - on amio. INR was elevated on admit and he received vitamin k 2.5mg  x 1. Coumadin resumed  INR 2.24 will restart home dose.  Home dose: 4mg  daily except 2mg  Friday and Sunday  Also continue  cefepime for staph bacteremia / HACP, s/p vancomycin for 3 days. Cultures have grown out MSSA so ID states continue cefepime through day #8 and then switch to ancef. Renal function is improving - cefepime dose is appropriate.  9/27 Vanc>>9/30 9/27 Cefepime>>  9/27 blood x2>> MSSA 9/27 urine>>negative 9/29 blood x1>>ngtd  Goal of Therapy:  INR 2-3 Monitor platelets by anticoagulation protocol: Yes   Plan:  1) Continue cefepime 1g IV q24 2) coumadin 4mg  x1   Bonnita Nasuti Pharm.D. CPP, BCPS Clinical Pharmacist [phone] 03/20/2014 9:04 AM

## 2014-03-20 NOTE — Progress Notes (Signed)
Advanced Heart Failure Rounding Note   Subjective:    [name] is a 80 y.o. male w/ PMHx significant for NICM with EF of 20% on chronic milrinone, HTN, pulm HTN, chronic renal insufficiency (baseline CR 1.8), retroperitoneal mass and PAF.  Just recently discharged 9/18 after AKI. Discharged weight was 160 lbs. Called last week about PICC line being out 7 cm and was scheduled for PICC exchange which he underwent (9/24).  Presented to the ED 9/27 with fevers 103 and erythema around PICC. Concern for sepsis and PICC exchanged. CT abdomen (9/28): increasing infiltration and atelectasis in the lung bases possible PNA.   PICC discontinued 9/27. BCX MSSA x 2..  Echo EF 20%. No obvious vegetation. Surveillance bcx remain negative.   Remains on milrinone 0.375 mcg. Renal function trending down.  Diuretics restarted 9/30. CT with LLL PNA.  Vanc stopped. Remains on cefipime. Feels weak. Weight up another pound.    Objective:   Weight Range:  Vital Signs:   Temp:  [98 F (36.7 C)-98.6 F (37 C)] 98.3 F (36.8 C) (10/01 0447) Pulse Rate:  [83-123] 84 (10/01 0400) Resp:  [20-34] 23 (10/01 0447) BP: (87-115)/(45-62) 91/45 mmHg (10/01 0447) SpO2:  [85 %-100 %] 99 % (10/01 0702) FiO2 (%):  [2 %] 2 % (09/30 1200) Weight:  [77.2 kg (170 lb 3.1 oz)] 77.2 kg (170 lb 3.1 oz) (10/01 0528) Last BM Date: 03/19/14  Weight change: Filed Weights   03/18/14 0500 03/19/14 0500 03/20/14 0528  Weight: 75.7 kg (166 lb 14.2 oz) 76.7 kg (169 lb 1.5 oz) 77.2 kg (170 lb 3.1 oz)    Intake/Output:   Intake/Output Summary (Last 24 hours) at 03/20/14 0743 Last data filed at 03/20/14 0700  Gross per 24 hour  Intake    120 ml  Output   1325 ml  Net  -1205 ml     Physical Exam: General:  Elderly appearing. No resp difficulty, on 2L [place] HEENT: normal Neck: supple. JVP 7-8 . Carotids 2+ bilat; no bruits. No lymphadenopathy or thryomegaly appreciated. Cor: PMI laterally dis placed. Distant regular Lungs:  diminished in the bases. Abdomen: tender, + distended. No hepatosplenomegaly. No bruits or masses. Good bowel sounds. Extremities: no cyanosis, clubbing, rash, edema, R arm erythema and warm to touch and edematous Neuro: alert & orientedx3, cranial nerves grossly intact. moves all 4 extremities w/o difficulty. Affect pleasant  Telemetry: V paced 90s  Labs: Basic Metabolic Panel:  Recent Labs Lab 03/16/14 1615 03/17/14 0442 03/18/14 0318 03/19/14 0343 03/20/14 0330  NA 141 141 136* 136* 138  K 3.8 3.7 4.2 4.5 4.7  CL 98 102 100 98 98  CO2 29 25 22 26 27   GLUCOSE 135* 141* 152* 127* 146*  BUN 27* 30* 35* 41* 41*  CREATININE 1.97* 2.19* 1.61* 1.54* 1.53*  CALCIUM 9.0 8.0* 8.4 8.5 8.4  MG  --   --   --  2.1  --     Liver Function Tests:  Recent Labs Lab 03/16/14 1615 03/17/14 0442  AST 29 51*  ALT 18 31  ALKPHOS 107 106  BILITOT 0.9 0.7  PROT 7.3 6.2  ALBUMIN 3.4* 2.8*    Recent Labs Lab 03/16/14 2116  LIPASE 11   No results found for this basename: AMMONIA,  in the last 168 hours  CBC:  Recent Labs Lab 03/16/14 1615 03/17/14 0442 03/19/14 0343 03/20/14 0330  WBC 25.7* 25.6* 14.0* 12.8*  NEUTROABS 24.4* 23.8* 11.8* 10.5*  HGB 11.9* 10.0* 10.2* 9.9*  HCT 35.2* 31.5* 31.7* 30.4*  MCV 94.1 95.2 97.5 94.1  PLT 281 235 251 266    Cardiac Enzymes: No results found for this basename: CKTOTAL, CKMB, CKMBINDEX, TROPONINI,  in the last 168 hours  BNP: BNP (last 3 results)  Recent Labs  11/07/13 0708 03/01/14 1959 03/08/14 2230  PROBNP 9356.0* 11880.0* 15334.0*     Other results:  EKG:   Imaging: US Abdomen Limited  03/18/2014   CLINICAL DATA:  80 year old male with abdominal distension and clinical suggestion of ascites. Staph bacteremia.  EXAM: LIMITED ABDOMEN ULTRASOUND FOR ASCITES  TECHNIQUE: Limited ultrasound survey for ascites was performed in all four abdominal quadrants.  COMPARISON:  03/02/2014 CT  FINDINGS: There is no evidence of  ascites.  IMPRESSION: No evidence of ascites.   Electronically Signed   By: Hassan Rowan M.D.   On: 03/18/2014 15:31     Medications:     Scheduled Medications: . amiodarone  200 mg Oral BID  . atorvastatin  20 mg Oral q1800  . budesonide (PULMICORT) nebulizer solution  0.25 mg Nebulization BID  . ceFEPime (MAXIPIME) IV  1 g Intravenous Q24H  . feeding supplement (ENSURE COMPLETE)  237 mL Oral TID BM  . levalbuterol  0.63 mg Nebulization Q6H  . loratadine  10 mg Oral Daily  . magnesium oxide  400 mg Oral Daily  . potassium chloride SA  20 mEq Oral Daily  . sildenafil  20 mg Oral TID  . sodium chloride  3 mL Intravenous Q12H  . torsemide  20 mg Oral Daily  . Warfarin - Pharmacist Dosing Inpatient   Does not apply q1800    Infusions: . sodium chloride 2 mL/hr (03/20/14 0130)  . 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 He went home on 9/18 and called the HF clinic last week d/t PICC being out 7 cm. Underwent exchange 9/24 and subsequently presented to the ED on 9/27 with fevers and erythema around PICC line. Bcx 2/2 MSSA. He also has PNA noted on CT.   Bcx 2/2 for MSSA. ID has seen. Infectious symptoms improving. Renal function better. He remains very weak. No appetite. I discussed his situation with him and his wife with the gracious assistance of Dr. Venetia Constable. He is not interested in Hospice at this point and wants to continue home milrinone. He understands that  his time may be very limited - in terms of weeks or months.   Will need to replace PICC line once surveillance cultures are finalized. Continue abx per ID. Increase demadex to bid.   Will discuss case with Sharyn Lull bednar to see if she can help with Palliative Services.   Glori Bickers  MD   03/20/2014

## 2014-03-20 NOTE — Progress Notes (Signed)
[facility] PROGRESS NOTE Interim History: [name] is a 80 y.o. male w/ PMHx significant for NICM with EF of 20% on chronic milrinone, HTN, pulm HTN, chronic renal insufficiency (baseline CR 1.8), retroperitoneal mass and PAF.Presented to the ED 9/27 with fevers 103 and erythema around PICC. Concern for sepsis and PICC exchanged. PICC discontinued 9/27 d/t blood cultures Bcx 2/2 with MSSA.   Filed Weights   03/18/14 0500 03/19/14 0500 03/20/14 0528  Weight: 75.7 kg (166 lb 14.2 oz) 76.7 kg (169 lb 1.5 oz) 77.2 kg (170 lb 3.1 oz)        Intake/Output Summary (Last 24 hours) at 03/20/14 0815 Last data filed at 03/20/14 0700  Gross per 24 hour  Intake    120 ml  Output   1125 ml  Net  -1005 ml     Assessment/Plan: Severe sepsis due to MSSA bacteremia and HCAP: - Multifactorial secondary to MSSA bacteremia (due to PICC line) and probable HCAP. repeat blood cultures negative till date. - Initial PICC line was discontinued on 03/17/2014.  - Started on vanc and cefepime, Vanc d/c,Sensitivities c/w MSSA. - Patient is currently afebrile.  - ID consulted. TTE does not show abnormality  - Repeat blood cultures negative - he desire to continue treatment with Milrinone and bacteremia,  will need to place another PICC. - cont cefepime for 2 weeks and repeat blood cultures in 1 week after completing treatment.  Acute respiratory failure with hypoxia due to acute on chronic systolic heart failure - Secondary to acute on chronic CHF exacerbation and  HCAP. Currently afebrile. Patient currently on milnirone.  - On Torsemide resume per heart failure team. Continue empiric  IV cefepime. - Weight has worsen today. Advance heart failure team on board.  Nausea and emesis  - due to acute decompensated HF. - Cardiac enzymes negative.  Acute on chronic kidney disease stage III  - Elevated creatinine was likely secondary  Heart failure. - Diuretics were held. Renal function is improved and  trended down to 1.5 today  Coagulopathy  Status post reversal with vitamin K and holding Coumadin. INR now at 2.2. Coumadin dosing per pharmacy.  paroxysmal atrial fibrillation  Currently rate controlled on amiodarone. INR is 1.86  pulmonary hypertension  Continue slidenafil.  Severe protein caloric malnutrition: - cont ensure.  Diarrhea  C. difficile PCR pending. WBC trending down.  Code Status: full Family Communication: wife  Disposition Plan: inpatient   Consultants:  Cardiology  ID  Procedures: ECHO: pending  Antibiotics:  Vanc 9.28.2015>>>9.30.2015  cefepime 9.28.2015>>  HPI/Subjective: Feels slightly better  Objective: Filed Vitals:   03/20/14 0400 03/20/14 0447 03/20/14 0528 03/20/14 0702  BP: 102/51 91/45    Pulse: 84     Temp:  98.3 F (36.8 C)    TempSrc:  Oral    Resp: 24 23    Height:      Weight:   77.2 kg (170 lb 3.1 oz)   SpO2: 96% 99%  99%     Exam:  General: Alert, awake, oriented x3, in no acute distress.  HEENT: No bruits, no goiter. +JVD Heart: Regular rate and rhythm. Lungs: Good air movement, clear Abdomen: Soft, nontender, distended abdomen positive bowel sounds.     Data Reviewed: Basic Metabolic Panel:  Recent Labs Lab 03/16/14 1615 03/17/14 0442 03/18/14 0318 03/19/14 0343 03/20/14 0330  NA 141 141 136* 136* 138  K 3.8 3.7 4.2 4.5 4.7  CL 98 102 100 98 98  CO2 29 25  22 26 27   GLUCOSE 135* 141* 152* 127* 146*  BUN 27* 30* 35* 41* 41*  CREATININE 1.97* 2.19* 1.61* 1.54* 1.53*  CALCIUM 9.0 8.0* 8.4 8.5 8.4  MG  --   --   --  2.1  --    Liver Function Tests:  Recent Labs Lab 03/16/14 1615 03/17/14 0442  AST 29 51*  ALT 18 31  ALKPHOS 107 106  BILITOT 0.9 0.7  PROT 7.3 6.2  ALBUMIN 3.4* 2.8*    Recent Labs Lab 03/16/14 2116  LIPASE 11   No results found for this basename: AMMONIA,  in the last 168 hours CBC:  Recent Labs Lab 03/16/14 1615 03/17/14 0442 03/19/14 0343 03/20/14 0330    WBC 25.7* 25.6* 14.0* 12.8*  NEUTROABS 24.4* 23.8* 11.8* 10.5*  HGB 11.9* 10.0* 10.2* 9.9*  HCT 35.2* 31.5* 31.7* 30.4*  MCV 94.1 95.2 97.5 94.1  PLT 281 235 251 266   Cardiac Enzymes: No results found for this basename: CKTOTAL, CKMB, CKMBINDEX, TROPONINI,  in the last 168 hours BNP (last 3 results)  Recent Labs  11/07/13 0708 03/01/14 1959 03/08/14 2230  PROBNP 9356.0* 11880.0* 15334.0*   CBG:  Recent Labs Lab 03/19/14 1302 03/19/14 1726 03/19/14 2037 03/19/14 2351 03/20/14 0553  GLUCAP 127* 161* 153* 119* 121*    Recent Results (from the past 240 hour(s))  CULTURE, BLOOD (ROUTINE X 2)     Status: None   Collection Time    03/16/14  4:15 PM      Result Value Ref Range Status   Specimen Description BLOOD BLOOD LEFT FOREARM   Final   Special Requests BOTTLES DRAWN AEROBIC AND ANAEROBIC 10CC EACH   Final   Culture  Setup Time     Final   Value: 03/17/2014 00:28     Performed at [facility]   Culture     Final   Value: STAPHYLOCOCCUS AUREUS     Note: RIFAMPIN AND GENTAMICIN SHOULD NOT BE USED AS SINGLE DRUGS FOR TREATMENT OF STAPH INFECTIONS.     Note: Gram Stain Report Called to,Read Back By and Verified With: J. Alvera Singh @ 10AM 9.28.15 DESIS Culture results may be compromised due to an excessive volume of blood received in culture bottles.     Performed at [facility]   Report Status 03/19/2014 FINAL   Final   Organism ID, Bacteria STAPHYLOCOCCUS AUREUS   Final  CULTURE, BLOOD (ROUTINE X 2)     Status: None   Collection Time    03/16/14  4:37 PM      Result Value Ref Range Status   Specimen Description BLOOD LEFT HAND   Final   Special Requests BOTTLES DRAWN AEROBIC AND ANAEROBIC [facility] EACH   Final   Culture  Setup Time     Final   Value: 03/17/2014 00:28     Performed at [facility]   Culture     Final   Value: STAPHYLOCOCCUS AUREUS     Note: SUSCEPTIBILITIES PERFORMED ON PREVIOUS CULTURE WITHIN THE LAST 5 DAYS.     Note: Gram Stain  Report Called to,Read Back By and Verified With: Valente David @ 10AM 9.28.15 DESIS     Performed at [facility]   Report Status 03/19/2014 FINAL   Final  URINE CULTURE     Status: None   Collection Time    03/16/14  4:59 PM      Result Value Ref Range Status   Specimen Description URINE, CLEAN  CATCH   Final   Special Requests NONE   Final   Culture  Setup Time     Final   Value: 03/17/2014 01:31     Performed at [facility]     Final   Value: NO GROWTH     Performed at [facility]   Culture     Final   Value: NO GROWTH     Performed at [facility]   Report Status 03/18/2014 FINAL   Final  MRSA PCR SCREENING     Status: None   Collection Time    03/17/14  6:22 AM      Result Value Ref Range Status   MRSA by PCR NEGATIVE  NEGATIVE Final   Comment:            The GeneXpert MRSA Assay (FDA     approved for NASAL specimens     only), is one component of a     comprehensive MRSA colonization     surveillance program. It is not     intended to diagnose MRSA     infection nor to guide or     monitor treatment for     MRSA infections.  CULTURE, BLOOD (ROUTINE X 2)     Status: None   Collection Time    03/18/14  4:50 PM      Result Value Ref Range Status   Specimen Description BLOOD RIGHT ARM   Final   Special Requests BOTTLES DRAWN AEROBIC ONLY 2CC   Final   Culture  Setup Time     Final   Value: 03/18/2014 22:28     Performed at [facility]   Culture     Final   Value:        BLOOD CULTURE RECEIVED NO GROWTH TO DATE CULTURE WILL BE HELD FOR 5 DAYS BEFORE ISSUING A FINAL NEGATIVE REPORT     Performed at [facility]   Report Status PENDING   Incomplete     Studies: US Abdomen Limited  03/18/2014   CLINICAL DATA:  80 year old male with abdominal distension and clinical suggestion of ascites. Staph bacteremia.  EXAM: LIMITED ABDOMEN ULTRASOUND FOR ASCITES  TECHNIQUE: Limited ultrasound survey for ascites was  performed in all four abdominal quadrants.  COMPARISON:  03/02/2014 CT  FINDINGS: There is no evidence of ascites.  IMPRESSION: No evidence of ascites.   Electronically Signed   By: Hassan Rowan M.D.   On: 03/18/2014 15:31    Scheduled Meds: . amiodarone  200 mg Oral BID  . atorvastatin  20 mg Oral q1800  . budesonide (PULMICORT) nebulizer solution  0.25 mg Nebulization BID  . ceFEPime (MAXIPIME) IV  1 g Intravenous Q24H  . feeding supplement (ENSURE COMPLETE)  237 mL Oral TID BM  . levalbuterol  0.63 mg Nebulization Q6H  . loratadine  10 mg Oral Daily  . magnesium oxide  400 mg Oral Daily  . potassium chloride SA  20 mEq Oral Daily  . sildenafil  20 mg Oral TID  . sodium chloride  3 mL Intravenous Q12H  . torsemide  20 mg Oral BID  . Warfarin - Pharmacist Dosing Inpatient   Does not apply q1800   Continuous Infusions: . sodium chloride 2 mL/hr (03/20/14 0130)  . milrinone 0.375 mcg/kg/min (03/20/14 0544)     Tyler Christensen  [facility] Pager [phone] If 8PM-8AM, please contact night-coverage at [email or web address], password [facility] 03/20/2014, 8:15  AM  LOS: 4 days     **Disclaimer: This note may have been dictated with voice recognition software. Similar sounding words can inadvertently be transcribed and this note may contain transcription errors which may not have been corrected upon publication of note.**

## 2014-03-21 DIAGNOSIS — I5023 Acute on chronic systolic (congestive) heart failure: Secondary | ICD-10-CM

## 2014-03-21 LAB — BASIC METABOLIC PANEL
Anion gap: 13 (ref 5–15)
BUN: 38 mg/dL — AB (ref 6–23)
CHLORIDE: 97 meq/L (ref 96–112)
CO2: 27 mEq/L (ref 19–32)
Calcium: 8.1 mg/dL — ABNORMAL LOW (ref 8.4–10.5)
Creatinine, Ser: 1.4 mg/dL — ABNORMAL HIGH (ref 0.50–1.35)
GFR calc non Af Amer: 46 mL/min — ABNORMAL LOW (ref >90)
GFR, EST AFRICAN AMERICAN: 54 mL/min — AB (ref >90)
Glucose, Bld: 132 mg/dL — ABNORMAL HIGH (ref 70–99)
Potassium: 3.9 mEq/L (ref 3.7–5.3)
SODIUM: 137 meq/L (ref 137–147)

## 2014-03-21 LAB — PROTIME-INR
INR: 2.88 — ABNORMAL HIGH (ref 0.00–1.49)
Prothrombin Time: 30.2 seconds — ABNORMAL HIGH (ref 11.6–15.2)

## 2014-03-21 LAB — GLUCOSE, CAPILLARY: Glucose-Capillary: 178 mg/dL — ABNORMAL HIGH (ref 70–99)

## 2014-03-21 MED ORDER — FUROSEMIDE 10 MG/ML IJ SOLN
80.0000 mg | Freq: Every day | INTRAMUSCULAR | Status: AC
  Administered 2014-03-21 – 2014-03-22 (×2): 80 mg via INTRAVENOUS
  Filled 2014-03-21 (×2): qty 8

## 2014-03-21 MED ORDER — FUROSEMIDE 10 MG/ML IJ SOLN
80.0000 mg | Freq: Once | INTRAMUSCULAR | Status: AC
  Administered 2014-03-21: 80 mg via INTRAVENOUS
  Filled 2014-03-21: qty 8

## 2014-03-21 MED ORDER — WARFARIN SODIUM 2 MG PO TABS
2.0000 mg | ORAL_TABLET | Freq: Once | ORAL | Status: AC
  Administered 2014-03-21: 2 mg via ORAL
  Filled 2014-03-21: qty 1

## 2014-03-21 MED ORDER — POTASSIUM CHLORIDE CRYS ER 20 MEQ PO TBCR: 20.0000 meq | EXTENDED_RELEASE_TABLET | Freq: Once | ORAL | Status: DC

## 2014-03-21 MED ORDER — POTASSIUM CHLORIDE CRYS ER 20 MEQ PO TBCR
40.0000 meq | EXTENDED_RELEASE_TABLET | Freq: Once | ORAL | Status: AC
  Administered 2014-03-21: 40 meq via ORAL
  Filled 2014-03-21: qty 2

## 2014-03-21 NOTE — Progress Notes (Signed)
ANTICOAGULATION CONSULT NOTE - Follow Up Consult  Pharmacy Consult for Coumadin  Indication: atrial fibrillation   Allergies  Allergen Reactions  . Lisinopril Rash    Patient Measurements: Height: 5\' 8"  (172.7 cm) Weight: 178 lb (80.74 kg) IBW/kg (Calculated) : 68.4  Vital Signs: Temp: 98.3 F (36.8 C) (10/02 0815) Temp Source: Oral (10/02 0815) BP: 97/44 mmHg (10/02 0815) Pulse Rate: 90 (10/02 0815)  Labs:  Recent Labs  03/19/14 0343 03/20/14 0330 03/21/14 0255  HGB 10.2* 9.9*  --   HCT 31.7* 30.4*  --   PLT 251 266  --   LABPROT 21.4* 24.8* 30.2*  INR 1.86* 2.24* 2.88*  CREATININE 1.54* 1.53* 1.40*    Estimated Creatinine Clearance: 41.4 ml/min (by C-G formula based on Cr of 1.4).  Assessment: 81yom continues on coumadin for afib - on amio. INR was elevated on admit and he received vitamin K 2.5mg  x 1. Coumadin resumed,  INR 1.86>>2.24>>2.88 after 6mg  and 4mg  doses. Hgb low stable, no s/sx bleeding. Home dose: 4mg  daily except 2mg  Friday and Sunday  Goal of Therapy:  INR 2-3 Monitor platelets by anticoagulation protocol: Yes   Plan:  1) Coumadin 2mg  x1 2) Daily INR, monitor Hgb  Tyler Christensen E. Shykeria Sakamoto, Pharm.D Clinical Pharmacy Resident Pager: [phone] 03/21/2014 10:46 AM

## 2014-03-21 NOTE — Progress Notes (Addendum)
[facility] PROGRESS NOTE Interim History: [name] is a 80 y.o. male w/ PMHx significant for NICM with EF of 20% on chronic milrinone, HTN, pulm HTN, chronic renal insufficiency (baseline CR 1.8), retroperitoneal mass and PAF.Presented to the ED 9/27 with fevers 103 and erythema around PICC. Concern for sepsis and PICC exchanged. PICC discontinued 9/27 d/t blood cultures Bcx 2/2 with MSSA.   Filed Weights   03/19/14 0500 03/20/14 0528 03/21/14 0413  Weight: 76.7 kg (169 lb 1.5 oz) 77.2 kg (170 lb 3.1 oz) 80.74 kg (178 lb)        Intake/Output Summary (Last 24 hours) at 03/21/14 0758 Last data filed at 03/21/14 0500  Gross per 24 hour  Intake   1479 ml  Output   1101 ml  Net    378 ml     Assessment/Plan: Severe sepsis due to MSSA bacteremia and HCAP: - Multifactorial secondary to MSSA bacteremia (due to PICC line) and probable HCAP. repeat blood cultures negative till date. - Initial PICC line was discontinued on 03/17/2014. Will re-insert PICC line on .10.5.2015 - Started on vanc and cefepime, Vanc d/c,Sensitivities c/w MSSA. - Patient is currently afebrile.  - ID consulted. TTE does not show abnormality  - cont cefepime for 2 weeks ( 5/14 days) and repeat blood cultures in 1 week after completing treatment. - 2 watery BM this am. Initially check for c.dif and was neg. ? Due to antibiotics. - Check a CBC. - he desire to continue treatment with Milrinone and bacteremia,  will need to place another PICC.  Acute respiratory failure with hypoxia due to acute on chronic systolic heart failure - Secondary to acute on chronic CHF exacerbation and  HCAP. Currently afebrile. Patient currently on milnirone.  - On Torsemide resume per heart failure team.  - Weight cont to worsen. Advance heart failure team on board.  Nausea and emesis  - due to acute decompensated HF. - Cardiac enzymes negative.  Acute on chronic kidney disease stage III  - Elevated creatinine was likely  secondary  Heart failure. - Renal function cont to improve.  Coagulopathy  - Status post reversal with vitamin K and holding Coumadin. INR now at 2.2. Coumadin dosing per pharmacy.  Paroxysmal atrial fibrillation  - Currently rate controlled on amiodarone. INR is therapeutic cont per pharmacy.  pulmonary hypertension  Continue slidenafil.   Severe protein caloric malnutrition: - cont ensure.  Diarrhea  C. difficile PCR pending. WBC trending down.  Code Status: full Family Communication: wife  Disposition Plan: inpatient   Consultants:  Cardiology  ID  Procedures: ECHO: pending  Antibiotics:  Vanc 9.28.2015>>>9.30.2015  cefepime 9.28.2015>>  HPI/Subjective: Had a good night sleep. 2 watery BM this am. Relates his abdomen is improved.  Objective: Filed Vitals:   03/21/14 0000 03/21/14 0145 03/21/14 0413 03/21/14 0600  BP: 96/47  94/40 88/43  Pulse: 89  82 80  Temp: 98.3 F (36.8 C)  98.7 F (37.1 C)   TempSrc: Oral  Oral   Resp: 25  19 26   Height:      Weight:   80.74 kg (178 lb)   SpO2: 97% 98% 95% 89%     Exam:  General: Alert, awake, oriented x3, in no acute distress.  HEENT: No bruits, no goiter. +JVD Heart: Regular rate and rhythm. Lungs: Good air movement, clear Abdomen: Soft, nontender, distended abdomen positive bowel sounds.     Data Reviewed: Basic Metabolic Panel:  Recent Labs Lab 03/17/14 0442 03/18/14 0318 03/19/14  0343 03/20/14 0330 03/21/14 0255  NA 141 136* 136* 138 137  K 3.7 4.2 4.5 4.7 3.9  CL 102 100 98 98 97  CO2 25 22 26 27 27   GLUCOSE 141* 152* 127* 146* 132*  BUN 30* 35* 41* 41* 38*  CREATININE 2.19* 1.61* 1.54* 1.53* 1.40*  CALCIUM 8.0* 8.4 8.5 8.4 8.1*  MG  --   --  2.1  --   --    Liver Function Tests:  Recent Labs Lab 03/16/14 1615 03/17/14 0442  AST 29 51*  ALT 18 31  ALKPHOS 107 106  BILITOT 0.9 0.7  PROT 7.3 6.2  ALBUMIN 3.4* 2.8*    Recent Labs Lab 03/16/14 2116  LIPASE 11   No  results found for this basename: AMMONIA,  in the last 168 hours CBC:  Recent Labs Lab 03/16/14 1615 03/17/14 0442 03/19/14 0343 03/20/14 0330  WBC 25.7* 25.6* 14.0* 12.8*  NEUTROABS 24.4* 23.8* 11.8* 10.5*  HGB 11.9* 10.0* 10.2* 9.9*  HCT 35.2* 31.5* 31.7* 30.4*  MCV 94.1 95.2 97.5 94.1  PLT 281 235 251 266   Cardiac Enzymes: No results found for this basename: CKTOTAL, CKMB, CKMBINDEX, TROPONINI,  in the last 168 hours BNP (last 3 results)  Recent Labs  11/07/13 0708 03/01/14 1959 03/08/14 2230  PROBNP 9356.0* 11880.0* 15334.0*   CBG:  Recent Labs Lab 03/19/14 1302 03/19/14 1726 03/19/14 2037 03/19/14 2351 03/20/14 0553  GLUCAP 127* 161* 153* 119* 121*    Recent Results (from the past 240 hour(s))  CULTURE, BLOOD (ROUTINE X 2)     Status: None   Collection Time    03/16/14  4:15 PM      Result Value Ref Range Status   Specimen Description BLOOD BLOOD LEFT FOREARM   Final   Special Requests BOTTLES DRAWN AEROBIC AND ANAEROBIC 10CC EACH   Final   Culture  Setup Time     Final   Value: 03/17/2014 00:28     Performed at [facility]   Culture     Final   Value: STAPHYLOCOCCUS AUREUS     Note: RIFAMPIN AND GENTAMICIN SHOULD NOT BE USED AS SINGLE DRUGS FOR TREATMENT OF STAPH INFECTIONS.     Note: Gram Stain Report Called to,Read Back By and Verified With: J. Alvera Singh @ 10AM 9.28.15 DESIS Culture results may be compromised due to an excessive volume of blood received in culture bottles.     Performed at [facility]   Report Status 03/19/2014 FINAL   Final   Organism ID, Bacteria STAPHYLOCOCCUS AUREUS   Final  CULTURE, BLOOD (ROUTINE X 2)     Status: None   Collection Time    03/16/14  4:37 PM      Result Value Ref Range Status   Specimen Description BLOOD LEFT HAND   Final   Special Requests BOTTLES DRAWN AEROBIC AND ANAEROBIC [facility] EACH   Final   Culture  Setup Time     Final   Value: 03/17/2014 00:28     Performed at [facility]    Culture     Final   Value: STAPHYLOCOCCUS AUREUS     Note: SUSCEPTIBILITIES PERFORMED ON PREVIOUS CULTURE WITHIN THE LAST 5 DAYS.     Note: Gram Stain Report Called to,Read Back By and Verified With: Valente David @ 10AM 9.28.15 DESIS     Performed at [facility]   Report Status 03/19/2014 FINAL   Final  URINE CULTURE  Status: None   Collection Time    03/16/14  4:59 PM      Result Value Ref Range Status   Specimen Description URINE, CLEAN CATCH   Final   Special Requests NONE   Final   Culture  Setup Time     Final   Value: 03/17/2014 01:31     Performed at [place]     Final   Value: NO GROWTH     Performed at [facility]   Culture     Final   Value: NO GROWTH     Performed at [facility]   Report Status 03/18/2014 FINAL   Final  MRSA PCR SCREENING     Status: None   Collection Time    03/17/14  6:22 AM      Result Value Ref Range Status   MRSA by PCR NEGATIVE  NEGATIVE Final   Comment:            The GeneXpert MRSA Assay (FDA     approved for NASAL specimens     only), is one component of a     comprehensive MRSA colonization     surveillance program. It is not     intended to diagnose MRSA     infection nor to guide or     monitor treatment for     MRSA infections.  CULTURE, BLOOD (ROUTINE X 2)     Status: None   Collection Time    03/18/14  4:50 PM      Result Value Ref Range Status   Specimen Description BLOOD RIGHT ARM   Final   Special Requests BOTTLES DRAWN AEROBIC ONLY 2CC   Final   Culture  Setup Time     Final   Value: 03/18/2014 22:28     Performed at [facility]   Culture     Final   Value:        BLOOD CULTURE RECEIVED NO GROWTH TO DATE CULTURE WILL BE HELD FOR 5 DAYS BEFORE ISSUING A FINAL NEGATIVE REPORT     Performed at [facility]   Report Status PENDING   Incomplete  CLOSTRIDIUM DIFFICILE BY PCR     Status: None   Collection Time    03/19/14  6:00 PM      Result Value Ref  Range Status   C difficile by pcr NEGATIVE  NEGATIVE Final     Studies: No results found.  Scheduled Meds: . amiodarone  200 mg Oral BID  . atorvastatin  20 mg Oral q1800  . budesonide (PULMICORT) nebulizer solution  0.25 mg Nebulization BID  . ceFEPime (MAXIPIME) IV  1 g Intravenous Q24H  . feeding supplement (ENSURE COMPLETE)  237 mL Oral TID BM  . levalbuterol  0.63 mg Nebulization Q6H  . loratadine  10 mg Oral Daily  . magnesium oxide  400 mg Oral Daily  . potassium chloride SA  20 mEq Oral Daily  . sildenafil  20 mg Oral TID  . sodium chloride  3 mL Intravenous Q12H  . torsemide  20 mg Oral BID  . Warfarin - Pharmacist Dosing Inpatient   Does not apply q1800   Continuous Infusions: . sodium chloride 2 mL/hr (03/20/14 0130)  . milrinone 0.375 mcg/kg/min (03/21/14 0736)     Charlynne Cousins  [facility] Pager [phone] If 8PM-8AM, please contact night-coverage at [email or web address], password [facility] 03/21/2014, 7:58 AM  LOS: 5 days     **  Disclaimer: This note may have been dictated with voice recognition software. Similar sounding words can inadvertently be transcribed and this note may contain transcription errors which may not have been corrected upon publication of note.**

## 2014-03-21 NOTE — Progress Notes (Signed)
Advanced Heart Failure Rounding Note   Subjective:    [name] is a 80 y.o. male w/ PMHx significant for NICM with EF of 20% on chronic milrinone, HTN, pulm HTN, chronic renal insufficiency (baseline CR 1.8), retroperitoneal mass and PAF.  Just recently discharged 9/18 after AKI. Discharged weight was 160 lbs. Called last week about PICC line being out 7 cm and was scheduled for PICC exchange which he underwent (9/24).  Presented to the ED 9/27 with fevers 103 and erythema around PICC. Concern for sepsis and PICC exchanged. CT abdomen (9/28): increasing infiltration and atelectasis in the lung bases possible PNA. PICC discontinued 9/27. BCX MSSA x 2. Echo EF 20%. No obvious vegetation. Surveillance bcx remain negative.   Remains on milrinone 0.375 mcg. Renal function trending down.  Diuretics increased yesterday. CT with LLL PNA.  Remains on cefipime. Feels weak. Reportedly weight up 8 pounds overnight. (we reweighed and weight is 170 which is up 7 pounds from baseline)  Long discussion with him and his wife yesterday about prognosis and EOL issues. He wants to continue with aggressive care for now including inotropes. Not interested in SNF or Hospice.   Objective:   Weight Range:  Vital Signs:   Temp:  [98 F (36.7 C)-98.7 F (37.1 C)] 98.3 F (36.8 C) (10/02 0815) Pulse Rate:  [80-95] 90 (10/02 0815) Resp:  [19-36] 27 (10/02 0815) BP: (88-108)/(40-63) 97/44 mmHg (10/02 0815) SpO2:  [87 %-100 %] 87 % (10/02 0815) Weight:  [80.74 kg (178 lb)] 80.74 kg (178 lb) (10/02 0413) Last BM Date: 03/20/14  Weight change: Filed Weights   03/19/14 0500 03/20/14 0528 03/21/14 0413  Weight: 76.7 kg (169 lb 1.5 oz) 77.2 kg (170 lb 3.1 oz) 80.74 kg (178 lb)    Intake/Output:   Intake/Output Summary (Last 24 hours) at 03/21/14 1025 Last data filed at 03/21/14 0500  Gross per 24 hour  Intake   1089 ml  Output   1101 ml  Net    -12 ml     Physical Exam: General:  Elderly appearing. No  resp difficulty, on 2L [place] HEENT: normal Neck: supple. JVP 7-8 . Carotids 2+ bilat; no bruits. No lymphadenopathy or thryomegaly appreciated. Cor: PMI laterally displaced placed. Distant regular Lungs: diminished in the bases. Abdomen: tender, + distended. No hepatosplenomegaly. No bruits or masses. Good bowel sounds. Extremities: no cyanosis, clubbing, rash, 2+ edema, R arm erythema and warm to touch and edematous Neuro: alert & orientedx3, cranial nerves grossly intact. moves all 4 extremities w/o difficulty. Affect pleasant  Telemetry: V paced 90s  Labs: Basic Metabolic Panel:  Recent Labs Lab 03/17/14 0442 03/18/14 0318 03/19/14 0343 03/20/14 0330 03/21/14 0255  NA 141 136* 136* 138 137  K 3.7 4.2 4.5 4.7 3.9  CL 102 100 98 98 97  CO2 25 22 26 27 27   GLUCOSE 141* 152* 127* 146* 132*  BUN 30* 35* 41* 41* 38*  CREATININE 2.19* 1.61* 1.54* 1.53* 1.40*  CALCIUM 8.0* 8.4 8.5 8.4 8.1*  MG  --   --  2.1  --   --     Liver Function Tests:  Recent Labs Lab 03/16/14 1615 03/17/14 0442  AST 29 51*  ALT 18 31  ALKPHOS 107 106  BILITOT 0.9 0.7  PROT 7.3 6.2  ALBUMIN 3.4* 2.8*    Recent Labs Lab 03/16/14 2116  LIPASE 11   No results found for this basename: AMMONIA,  in the last 168 hours  CBC:  Recent Labs  Lab 03/16/14 1615 03/17/14 0442 03/19/14 0343 03/20/14 0330  WBC 25.7* 25.6* 14.0* 12.8*  NEUTROABS 24.4* 23.8* 11.8* 10.5*  HGB 11.9* 10.0* 10.2* 9.9*  HCT 35.2* 31.5* 31.7* 30.4*  MCV 94.1 95.2 97.5 94.1  PLT 281 235 251 266    Cardiac Enzymes: No results found for this basename: CKTOTAL, CKMB, CKMBINDEX, TROPONINI,  in the last 168 hours  BNP: BNP (last 3 results)  Recent Labs  11/07/13 0708 03/01/14 1959 03/08/14 2230  PROBNP 9356.0* 11880.0* 15334.0*     Other results:  EKG:   Imaging: No results found.   Medications:     Scheduled Medications: . amiodarone  200 mg Oral BID  . atorvastatin  20 mg Oral q1800  . budesonide  (PULMICORT) nebulizer solution  0.25 mg Nebulization BID  . ceFEPime (MAXIPIME) IV  1 g Intravenous Q24H  . feeding supplement (ENSURE COMPLETE)  237 mL Oral TID BM  . levalbuterol  0.63 mg Nebulization Q6H  . loratadine  10 mg Oral Daily  . magnesium oxide  400 mg Oral Daily  . potassium chloride SA  20 mEq Oral Daily  . sildenafil  20 mg Oral TID  . sodium chloride  3 mL Intravenous Q12H  . torsemide  20 mg Oral BID  . Warfarin - Pharmacist Dosing Inpatient   Does not apply q1800    Infusions: . sodium chloride 2 mL/hr (03/20/14 0130)  . 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 is well known to the HF team and was just admitted for AKI and abdominal distention. He went home on 9/18 and called the HF clinic last week d/t PICC being out 7 cm. Underwent exchange 9/24 and subsequently presented to the ED on 9/27 with fevers and erythema around PICC line. Bcx 2/2 MSSA. He also has PNA noted on CT.   Bcx 2/2 for MSSA. ID has seen. Infectious symptoms improving. Will need to replace PICC line once surveillance cultures are finalized. Will likely place power picc in RIJ line on Monday with IR, Continue abx per ID.  He is volume overloaded. Will give lasix 80IV bid today and place TED hose.   Plan will be d/c home early next week with milrinone.   Will discuss case with Rosanne Sack to see if she can help with Palliative Services.   Tyler Bickers  MD   03/21/2014

## 2014-03-21 NOTE — Evaluation (Signed)
Physical Therapy Evaluation Patient Details Name: Tyler Christensen MRN: [id number] DOB: 1932-07-28 Today's Date: 03/21/2014   History of Present Illness  Pt is a 80 y.o. 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 Patient states that he's unable to take anything because of N/V. Patient states his abdominal discomfort also has increased from prior with increasing abdominal distention. In the ER patient was found to be febrile with temperatures around 103F and was initially mildly hypotensive. Chest x-ray shows congestive pattern. Patient on exam has erythema around his right-sided upper extremity PICC line. PICC line was placed in September and patient states that since, it is erythema.  Clinical Impression  Pt admitted with the above. Pt currently with functional limitations due to the deficits listed below (see PT Problem List). At the time of PT eval pt was able to perform functional mobility and ambulation with min guard assist. Interpreter utilized via phone throughout session. Pt will benefit from skilled PT to increase their independence and safety with mobility to allow discharge to the venue listed below.       Follow Up Recommendations Home health PT;Supervision for mobility/OOB    Equipment Recommendations  Rolling walker with 5" wheels    Recommendations for Other Services       Precautions / Restrictions Precautions Precautions: Fall Precaution Comments: Requires interpreter  Restrictions Weight Bearing Restrictions: No      Mobility  Bed Mobility               General bed mobility comments: Pt sitting in recliner upon PT arrival  Transfers Overall transfer level: Needs assistance Equipment used: Rolling walker (2 wheeled) Transfers: Sit to/from Stand Sit to Stand: Min  guard         General transfer comment: VC's for hand placement on seated surface for safety.   Ambulation/Gait Ambulation/Gait assistance: Min guard Ambulation Distance (Feet): 125 Feet Assistive device: Rolling walker (2 wheeled) Gait Pattern/deviations: Step-through pattern;Decreased stride length;Trunk flexed Gait velocity: Decreased Gait velocity interpretation: Below normal speed for age/gender General Gait Details: Pt fatigues after ~60 feet and asks to turn around to head back to the room. Pt overall steady with RW, no physical assist required.   Stairs            Wheelchair Mobility    Modified Rankin (Stroke Patients Only)       Balance Overall balance assessment: Needs assistance Sitting-balance support: Feet supported;No upper extremity supported Sitting balance-Leahy Scale: Good     Standing balance support: Bilateral upper extremity supported;During functional activity Standing balance-Leahy Scale: Poor Standing balance comment: Pt requires UE support for dynamic standing balance.                              Pertinent Vitals/Pain Pain Assessment: No/denies pain    Home Living Family/patient expects to be discharged to:: Private residence Living Arrangements: Spouse/significant other;Other relatives Available Help at Discharge: Family;Available 24 hours/day Type of Home: House Home Access: Level entry     Home Layout: One level Home Equipment: None      Prior Function Level of Independence: Independent               Hand Dominance        Extremity/Trunk Assessment   Upper Extremity  Assessment: Defer to OT evaluation           Lower Extremity Assessment: Generalized weakness      Cervical / Trunk Assessment: Kyphotic  Communication   Communication: Prefers language other than [place];Interpreter utilized  Cognition Arousal/Alertness: Awake/alert Behavior During Therapy: WFL for tasks  assessed/performed Overall Cognitive Status: Within Functional Limits for tasks assessed                      General Comments      Exercises        Assessment/Plan    PT Assessment Patient needs continued PT services  PT Diagnosis Difficulty walking;Generalized weakness   PT Problem List Decreased strength;Decreased range of motion;Decreased activity tolerance;Decreased balance;Decreased mobility;Decreased knowledge of use of DME;Decreased safety awareness;Decreased knowledge of precautions  PT Treatment Interventions DME instruction;Gait training;Functional mobility training;Therapeutic activities;Therapeutic exercise;Neuromuscular re-education;Patient/family education   PT Goals (Current goals can be found in the Care Plan section) Acute Rehab PT Goals Patient Stated Goal: To return home with his family.  PT Goal Formulation: With patient Time For Goal Achievement: 03/28/14 Potential to Achieve Goals: Good    Frequency Min 3X/week   Barriers to discharge        Co-evaluation               End of Session Equipment Utilized During Treatment: Gait belt Activity Tolerance: Patient limited by fatigue Patient left: in chair;with call bell/phone within reach Nurse Communication: Mobility status         Time: 4818-5631 PT Time Calculation (min): 35 min   Charges:   PT Evaluation $Initial PT Evaluation Tier I: 1 Procedure PT Treatments $Gait Training: 8-22 mins $Therapeutic Activity: 8-22 mins   PT G Codes:          Rolinda Roan 03/21/2014, 5:12 PM  Rolinda Roan, PT, DPT Acute Rehabilitation Services Pager: [phone]

## 2014-03-22 DIAGNOSIS — I482 Chronic atrial fibrillation: Secondary | ICD-10-CM

## 2014-03-22 DIAGNOSIS — J189 Pneumonia, unspecified organism: Secondary | ICD-10-CM

## 2014-03-22 LAB — CBC
HCT: 31.6 % — ABNORMAL LOW (ref 39.0–52.0)
Hemoglobin: 10.4 g/dL — ABNORMAL LOW (ref 13.0–17.0)
MCH: 31.5 pg (ref 26.0–34.0)
MCHC: 32.9 g/dL (ref 30.0–36.0)
MCV: 95.8 fL (ref 78.0–100.0)
PLATELETS: 336 10*3/uL (ref 150–400)
RBC: 3.3 MIL/uL — AB (ref 4.22–5.81)
RDW: 15.7 % — AB (ref 11.5–15.5)
WBC: 14.2 10*3/uL — ABNORMAL HIGH (ref 4.0–10.5)

## 2014-03-22 LAB — BASIC METABOLIC PANEL
Anion gap: 11 (ref 5–15)
BUN: 37 mg/dL — AB (ref 6–23)
CALCIUM: 8.2 mg/dL — AB (ref 8.4–10.5)
CO2: 32 mEq/L (ref 19–32)
Chloride: 97 mEq/L (ref 96–112)
Creatinine, Ser: 1.37 mg/dL — ABNORMAL HIGH (ref 0.50–1.35)
GFR calc Af Amer: 55 mL/min — ABNORMAL LOW (ref >90)
GFR calc non Af Amer: 47 mL/min — ABNORMAL LOW (ref >90)
GLUCOSE: 131 mg/dL — AB (ref 70–99)
Potassium: 4 mEq/L (ref 3.7–5.3)
Sodium: 140 mEq/L (ref 137–147)

## 2014-03-22 LAB — CLOSTRIDIUM DIFFICILE BY PCR: Toxigenic C. Difficile by PCR: NEGATIVE

## 2014-03-22 LAB — PROTIME-INR
INR: 3.31 — ABNORMAL HIGH (ref 0.00–1.49)
Prothrombin Time: 33.6 seconds — ABNORMAL HIGH (ref 11.6–15.2)

## 2014-03-22 MED ORDER — METOLAZONE 2.5 MG PO TABS
2.5000 mg | ORAL_TABLET | Freq: Once | ORAL | Status: AC
  Administered 2014-03-22: 2.5 mg via ORAL
  Filled 2014-03-22: qty 1

## 2014-03-22 MED ORDER — RISAQUAD PO CAPS
2.0000 | ORAL_CAPSULE | Freq: Every day | ORAL | Status: DC
  Administered 2014-03-22 – 2014-04-05 (×15): 2 via ORAL
  Filled 2014-03-22 (×15): qty 2

## 2014-03-22 MED ORDER — LEVALBUTEROL HCL 0.63 MG/3ML IN NEBU
0.6300 mg | INHALATION_SOLUTION | RESPIRATORY_TRACT | Status: DC | PRN
  Administered 2014-03-23 – 2014-03-24 (×2): 0.63 mg via RESPIRATORY_TRACT
  Filled 2014-03-22: qty 3

## 2014-03-22 MED ORDER — FUROSEMIDE 10 MG/ML IJ SOLN
80.0000 mg | Freq: Two times a day (BID) | INTRAMUSCULAR | Status: DC
  Administered 2014-03-22 – 2014-03-23 (×2): 80 mg via INTRAVENOUS
  Filled 2014-03-22 (×3): qty 8

## 2014-03-22 NOTE — Progress Notes (Signed)
ANTICOAGULATION CONSULT NOTE - Follow Up Consult  Pharmacy Consult for Coumadin  Indication: atrial fibrillation   Allergies  Allergen Reactions  . Lisinopril Rash    Patient Measurements: Height: 5\' 8"  (172.7 cm) Weight: 170 lb 13.7 oz (77.5 kg) IBW/kg (Calculated) : 68.4  Vital Signs: Temp: 97.3 F (36.3 C) (10/03 1610) Temp Source: Oral (10/03 1610) BP: 112/48 mmHg (10/03 1610) Pulse Rate: 91 (10/03 1610)  Labs:  Recent Labs  03/20/14 0330 03/21/14 0255 03/22/14 0253  HGB 9.9*  --  10.4*  HCT 30.4*  --  31.6*  PLT 266  --  336  LABPROT 24.8* 30.2* 33.6*  INR 2.24* 2.88* 3.31*  CREATININE 1.53* 1.40* 1.37*    Estimated Creatinine Clearance: 42.3 ml/min (by C-G formula based on Cr of 1.37).  Assessment: 81yom continues on coumadin for afib - on amio. INR was elevated on admit and he received vitamin K 2.5mg  x 1. Coumadin resumed,  INR 1.86>>2.24>>2.88 > 3.3 He seems to be very sensitive to even small doses of warfarin . Hgb low stable, no s/sx bleeding. Home dose: 4mg  daily except 2mg  Friday and Sunday  Goal of Therapy:  INR 2-3 Monitor platelets by anticoagulation protocol: Yes   Plan:  1) Coumadin hold today 2) Daily INR, monitor Hgb  Bonnita Nasuti Pharm.D. CPP, BCPS Clinical Pharmacist [phone] 03/22/2014 4:43 PM

## 2014-03-22 NOTE — Progress Notes (Signed)
[facility] PROGRESS NOTE Interim History: Tyler Christensen is a 80 y.o. male w/ PMHx significant for NICM with EF of 20% on chronic milrinone, HTN, pulm HTN, chronic renal insufficiency (baseline CR 1.8), retroperitoneal mass and PAF.Presented to the ED 9/27 with fevers 103 and erythema around PICC. Concern for sepsis and PICC exchanged. PICC discontinued 9/27 d/t blood cultures Bcx 2/2 with MSSA.   Filed Weights   03/20/14 0528 03/21/14 0413 03/22/14 0300  Weight: 77.2 kg (170 lb 3.1 oz) 80.74 kg (178 lb) 77.5 kg (170 lb 13.7 oz)        Intake/Output Summary (Last 24 hours) at 03/22/14 0818 Last data filed at 03/22/14 0700  Gross per 24 hour  Intake    912 ml  Output   1800 ml  Net   -888 ml     Assessment/Plan: Severe sepsis due to MSSA bacteremia and HCAP: - Multifactorial secondary to MSSA bacteremia (due to PICC line) and probable HCAP. repeat blood cultures negative till date. - Initial PICC line was discontinued on 03/17/2014. Will re-insert PICC line on .10.5.2015 - Started on vanc and cefepime, Vanc d/c,Sensitivities c/w MSSA. - Patient is currently afebrile.  - ID consulted. TTE does not show abnormality  - cont cefepime for 2 weeks ( 5/14 days) and repeat blood cultures in 1 week after completing treatment. - 5 watery BM in the last 24hrs. Initially c.dif  was neg. Recheck c.dif start probiotics - Check a CBC. - he desire to continue treatment with Milrinone and bacteremia,  will need to place another PICC.  Acute respiratory failure with hypoxia due to acute on chronic systolic heart failure - Secondary to acute on chronic CHF exacerbation and  HCAP. Currently afebrile. Patient currently on milnirone.  - On Torsemide resume per heart failure team. Moderate UOP. - Weight has improved as above. Advance heart failure team on board.  Nausea and emesis  - due to acute decompensated HF and sepsis, now resolved - Cardiac enzymes negative.  Acute on chronic kidney  disease stage III  - Elevated creatinine was likely secondary  Heart failure. - Renal function cont to improve with HF treatment.  Coagulopathy  - Coumadin per pharmacy. INR now at 3.3. Coumadin dosing per pharmacy.  Paroxysmal atrial fibrillation  - Currently rate controlled on amiodarone. INR is therapeutic cont per pharmacy.  pulmonary hypertension  Continue slidenafil.   Severe protein caloric malnutrition: - cont ensure.  Diarrhea  C. difficile PCR pending. WBC trending down.  Code Status: full Family Communication: wife  Disposition Plan: inpatient   Consultants:  Cardiology  ID  Procedures: ECHO: pending  Antibiotics:  Vanc 9.28.2015>>>9.30.2015  cefepime 9.28.2015>>  HPI/Subjective: Had a good night sleep. Relates he cont to feel better 5 watery BM in the last 24hrs  Objective: Filed Vitals:   03/22/14 0205 03/22/14 0300 03/22/14 0400 03/22/14 0759  BP:   95/48 107/53  Pulse:  35  91  Temp:  98.3 F (36.8 C)  98.7 F (37.1 C)  TempSrc:  Oral  Oral  Resp:  18 21 22   Height:      Weight:  77.5 kg (170 lb 13.7 oz)    SpO2: 95% 99% 99% 100%     Exam:  General: Alert, awake, oriented x3, in no acute distress.  HEENT: No bruits, no goiter. +JVD Heart: Regular rate and rhythm.3+ edema Lungs: Good air movement, clear Abdomen: Soft, nontender, distended abdomen positive bowel sounds.     Data Reviewed: Basic Metabolic Panel:  Recent Labs Lab 03/18/14 0318 03/19/14 0343 03/20/14 0330 03/21/14 0255 03/22/14 0253  NA 136* 136* 138 137 140  K 4.2 4.5 4.7 3.9 4.0  CL 100 98 98 97 97  CO2 22 26 27 27  32  GLUCOSE 152* 127* 146* 132* 131*  BUN 35* 41* 41* 38* 37*  CREATININE 1.61* 1.54* 1.53* 1.40* 1.37*  CALCIUM 8.4 8.5 8.4 8.1* 8.2*  MG  --  2.1  --   --   --    Liver Function Tests:  Recent Labs Lab 03/16/14 1615 03/17/14 0442  AST 29 51*  ALT 18 31  ALKPHOS 107 106  BILITOT 0.9 0.7  PROT 7.3 6.2  ALBUMIN 3.4* 2.8*     Recent Labs Lab 03/16/14 2116  LIPASE 11   No results found for this basename: AMMONIA,  in the last 168 hours CBC:  Recent Labs Lab 03/16/14 1615 03/17/14 0442 03/19/14 0343 03/20/14 0330 03/22/14 0253  WBC 25.7* 25.6* 14.0* 12.8* 14.2*  NEUTROABS 24.4* 23.8* 11.8* 10.5*  --   HGB 11.9* 10.0* 10.2* 9.9* 10.4*  HCT 35.2* 31.5* 31.7* 30.4* 31.6*  MCV 94.1 95.2 97.5 94.1 95.8  PLT 281 235 251 266 336   Cardiac Enzymes: No results found for this basename: CKTOTAL, CKMB, CKMBINDEX, TROPONINI,  in the last 168 hours BNP (last 3 results)  Recent Labs  11/07/13 0708 03/01/14 1959 03/08/14 2230  PROBNP 9356.0* 11880.0* 15334.0*   CBG:  Recent Labs Lab 03/19/14 1726 03/19/14 2037 03/19/14 2351 03/20/14 0553 03/21/14 1703  GLUCAP 161* 153* 119* 121* 178*    Recent Results (from the past 240 hour(s))  CULTURE, BLOOD (ROUTINE X 2)     Status: None   Collection Time    03/16/14  4:15 PM      Result Value Ref Range Status   Specimen Description BLOOD BLOOD LEFT FOREARM   Final   Special Requests BOTTLES DRAWN AEROBIC AND ANAEROBIC 10CC EACH   Final   Culture  Setup Time     Final   Value: 03/17/2014 00:28     Performed at [facility]   Culture     Final   Value: STAPHYLOCOCCUS AUREUS     Note: RIFAMPIN AND GENTAMICIN SHOULD NOT BE USED AS SINGLE DRUGS FOR TREATMENT OF STAPH INFECTIONS.     Note: Gram Stain Report Called to,Read Back By and Verified With: J. Alvera Singh @ 10AM 9.28.15 DESIS Culture results may be compromised due to an excessive volume of blood received in culture bottles.     Performed at [facility]   Report Status 03/19/2014 FINAL   Final   Organism ID, Bacteria STAPHYLOCOCCUS AUREUS   Final  CULTURE, BLOOD (ROUTINE X 2)     Status: None   Collection Time    03/16/14  4:37 PM      Result Value Ref Range Status   Specimen Description BLOOD LEFT HAND   Final   Special Requests BOTTLES DRAWN AEROBIC AND ANAEROBIC [facility] EACH   Final    Culture  Setup Time     Final   Value: 03/17/2014 00:28     Performed at [facility]   Culture     Final   Value: STAPHYLOCOCCUS AUREUS     Note: SUSCEPTIBILITIES PERFORMED ON PREVIOUS CULTURE WITHIN THE LAST 5 DAYS.     Note: Gram Stain Report Called to,Read Back By and Verified With: Valente David @ 10AM 9.28.15 DESIS     Performed at [facility]   Report Status 03/19/2014 FINAL   Final  URINE CULTURE     Status: None   Collection Time    03/16/14  4:59 PM      Result Value Ref Range Status   Specimen Description URINE, CLEAN CATCH   Final   Special Requests NONE   Final   Culture  Setup Time     Final   Value: 03/17/2014 01:31     Performed at [place]     Final   Value: NO GROWTH     Performed at [facility]   Culture     Final   Value: NO GROWTH     Performed at [facility]   Report Status 03/18/2014 FINAL   Final  MRSA PCR SCREENING     Status: None   Collection Time    03/17/14  6:22 AM      Result Value Ref Range Status   MRSA by PCR NEGATIVE  NEGATIVE Final   Comment:            The GeneXpert MRSA Assay (FDA     approved for NASAL specimens     only), is one component of a     comprehensive MRSA colonization     surveillance program. It is not     intended to diagnose MRSA     infection nor to guide or     monitor treatment for     MRSA infections.  CULTURE, BLOOD (ROUTINE X 2)     Status: None   Collection Time    03/18/14  4:50 PM      Result Value Ref Range Status   Specimen Description BLOOD RIGHT ARM   Final   Special Requests BOTTLES DRAWN AEROBIC ONLY 2CC   Final   Culture  Setup Time     Final   Value: 03/18/2014 22:28     Performed at [facility]   Culture     Final   Value:        BLOOD CULTURE RECEIVED NO GROWTH TO DATE CULTURE WILL BE HELD FOR 5 DAYS BEFORE ISSUING A FINAL NEGATIVE REPORT     Performed at [facility]   Report Status PENDING   Incomplete   CLOSTRIDIUM DIFFICILE BY PCR     Status: None   Collection Time    03/19/14  6:00 PM      Result Value Ref Range Status   C difficile by pcr NEGATIVE  NEGATIVE Final     Studies: No results found.  Scheduled Meds: . amiodarone  200 mg Oral BID  . atorvastatin  20 mg Oral q1800  . budesonide (PULMICORT) nebulizer solution  0.25 mg Nebulization BID  . ceFEPime (MAXIPIME) IV  1 g Intravenous Q24H  . feeding supplement (ENSURE COMPLETE)  237 mL Oral TID BM  . furosemide  80 mg Intravenous Daily  . levalbuterol  0.63 mg Nebulization Q6H  . loratadine  10 mg Oral Daily  . magnesium oxide  400 mg Oral Daily  . potassium chloride SA  20 mEq Oral Daily  . sildenafil  20 mg Oral TID  . sodium chloride  3 mL Intravenous Q12H  . Warfarin - Pharmacist Dosing Inpatient   Does not apply q1800   Continuous Infusions: . sodium chloride 2 mL/hr (03/20/14 0130)  . milrinone 0.375 mcg/kg/min (03/21/14 1929)     Charlynne Cousins  [facility] Pager  [phone] If 8PM-8AM, please contact night-coverage at [email or web address], password [facility] 03/22/2014, 8:18 AM  LOS: 6 days     **Disclaimer: This note may have been dictated with voice recognition software. Similar sounding words can inadvertently be transcribed and this note may contain transcription errors which may not have been corrected upon publication of note.**

## 2014-03-22 NOTE — Progress Notes (Addendum)
Advanced Heart Failure Rounding Note   Subjective:    [name] is a 80 y.o. male w/ PMHx significant for NICM with EF of 20% on chronic milrinone, HTN, pulm HTN, chronic renal insufficiency (baseline CR 1.8), retroperitoneal mass and PAF.  Just recently discharged 9/18 after AKI. Discharged weight was 160 lbs. Called last week about PICC line being out 7 cm and was scheduled for PICC exchange which he underwent (9/24).  Presented to the ED 9/27 with fevers 103 and erythema around PICC. Concern for sepsis and PICC exchanged. CT abdomen (9/28): increasing infiltration and atelectasis in the lung bases possible PNA. PICC discontinued 9/27. BCX MSSA x 2. Echo EF 20%. No obvious vegetation. Surveillance bcx remain negative.   Remains on milrinone 0.375 mcg. Renal function backed to baseline. IV diuretics restarted yesterday. Weight stable at 170 (which is up 7 pounds from baseline. Continues on cefipime for PNA and MSSA bacteremia. Feels good. No dyspnea.   Long discussion with him and his wife about prognosis and EOL issues. He wants to continue with aggressive care for now including inotropes. Not interested in SNF or Hospice.   Objective:   Weight Range:  Vital Signs:   Temp:  [97.5 F (36.4 C)-98.7 F (37.1 C)] 98.7 F (37.1 C) (10/03 0759) Pulse Rate:  [35-100] 91 (10/03 0759) Resp:  [16-32] 22 (10/03 0759) BP: (95-115)/(39-74) 107/53 mmHg (10/03 0759) SpO2:  [92 %-100 %] 95 % (10/03 0833) FiO2 (%):  [28 %] 28 % (10/03 0205) Weight:  [77.5 kg (170 lb 13.7 oz)] 77.5 kg (170 lb 13.7 oz) (10/03 0300) Last BM Date: 03/21/14  Weight change: Filed Weights   03/20/14 0528 03/21/14 0413 03/22/14 0300  Weight: 77.2 kg (170 lb 3.1 oz) 80.74 kg (178 lb) 77.5 kg (170 lb 13.7 oz)    Intake/Output:   Intake/Output Summary (Last 24 hours) at 03/22/14 1019 Last data filed at 03/22/14 1000  Gross per 24 hour  Intake    742 ml  Output   1675 ml  Net   -933 ml     Physical  Exam: General:  Elderly appearing. No resp difficulty, on 2L [place] HEENT: normal Neck: supple. JVP 7-8 . Carotids 2+ bilat; no bruits. No lymphadenopathy or thryomegaly appreciated. Cor: PMI laterally displaced placed. Distant regular Lungs: diminished in the bases. Abdomen: tender, + distended. No hepatosplenomegaly. No bruits or masses. Good bowel sounds. Extremities: no cyanosis, clubbing, rash, 2+ edema, R arm erythema and warm to touch and edematous Neuro: alert & orientedx3, cranial nerves grossly intact. moves all 4 extremities w/o difficulty. Affect pleasant  Telemetry: V paced 90s  Labs: Basic Metabolic Panel:  Recent Labs Lab 03/18/14 0318 03/19/14 0343 03/20/14 0330 03/21/14 0255 03/22/14 0253  NA 136* 136* 138 137 140  K 4.2 4.5 4.7 3.9 4.0  CL 100 98 98 97 97  CO2 22 26 27 27  32  GLUCOSE 152* 127* 146* 132* 131*  BUN 35* 41* 41* 38* 37*  CREATININE 1.61* 1.54* 1.53* 1.40* 1.37*  CALCIUM 8.4 8.5 8.4 8.1* 8.2*  MG  --  2.1  --   --   --     Liver Function Tests:  Recent Labs Lab 03/16/14 1615 03/17/14 0442  AST 29 51*  ALT 18 31  ALKPHOS 107 106  BILITOT 0.9 0.7  PROT 7.3 6.2  ALBUMIN 3.4* 2.8*    Recent Labs Lab 03/16/14 2116  LIPASE 11   No results found for this basename: AMMONIA,  in the  last 168 hours  CBC:  Recent Labs Lab 03/16/14 1615 03/17/14 0442 03/19/14 0343 03/20/14 0330 03/22/14 0253  WBC 25.7* 25.6* 14.0* 12.8* 14.2*  NEUTROABS 24.4* 23.8* 11.8* 10.5*  --   HGB 11.9* 10.0* 10.2* 9.9* 10.4*  HCT 35.2* 31.5* 31.7* 30.4* 31.6*  MCV 94.1 95.2 97.5 94.1 95.8  PLT 281 235 251 266 336    Cardiac Enzymes: No results found for this basename: CKTOTAL, CKMB, CKMBINDEX, TROPONINI,  in the last 168 hours  BNP: BNP (last 3 results)  Recent Labs  11/07/13 0708 03/01/14 1959 03/08/14 2230  PROBNP 9356.0* 11880.0* 15334.0*     Other results:  EKG:   Imaging: No results found.   Medications:     Scheduled  Medications: . acidophilus  2 capsule Oral Daily  . amiodarone  200 mg Oral BID  . atorvastatin  20 mg Oral q1800  . budesonide (PULMICORT) nebulizer solution  0.25 mg Nebulization BID  . ceFEPime (MAXIPIME) IV  1 g Intravenous Q24H  . feeding supplement (ENSURE COMPLETE)  237 mL Oral TID BM  . levalbuterol  0.63 mg Nebulization Q6H  . loratadine  10 mg Oral Daily  . magnesium oxide  400 mg Oral Daily  . potassium chloride SA  20 mEq Oral Daily  . sildenafil  20 mg Oral TID  . sodium chloride  3 mL Intravenous Q12H  . Warfarin - Pharmacist Dosing Inpatient   Does not apply q1800    Infusions: . sodium chloride 2 mL/hr (03/20/14 0130)  . 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 is well known to the HF team and was just admitted for AKI and abdominal distention. He went home on 9/18 and called the HF clinic last week d/t PICC being out 7 cm. Underwent exchange 9/24 and subsequently presented to the ED on 9/27 with fevers and erythema around PICC line. Bcx 2/2 MSSA. He also has PNA noted on CT.   Bcx 2/2 for MSSA. ID has seen. Infectious symptoms improving. Will need to replace PICC line once surveillance cultures are finalized. Will likely place power picc in RIJ line on Monday with IR, Continue abx per ID (thanks). WBC trending back up. No fever. Will need to watch closely.   He is volume overloaded. Will continue lasix 80IV bid. One dose metolazone  INR 3.3. Pharmacy adjusting (appreciate their assistance)  Plan will be d/c home early  next week with milrinone.   Will discuss case with Rosanne Sack to see if she can help with Palliative Services.   Glori Bickers  MD   03/22/2014

## 2014-03-23 LAB — CBC
HCT: 34 % — ABNORMAL LOW (ref 39.0–52.0)
HEMOGLOBIN: 11.3 g/dL — AB (ref 13.0–17.0)
MCH: 30.9 pg (ref 26.0–34.0)
MCHC: 33.2 g/dL (ref 30.0–36.0)
MCV: 92.9 fL (ref 78.0–100.0)
Platelets: 399 10*3/uL (ref 150–400)
RBC: 3.66 MIL/uL — ABNORMAL LOW (ref 4.22–5.81)
RDW: 15.9 % — ABNORMAL HIGH (ref 11.5–15.5)
WBC: 14 10*3/uL — ABNORMAL HIGH (ref 4.0–10.5)

## 2014-03-23 LAB — PROTIME-INR
INR: 2.73 — ABNORMAL HIGH (ref 0.00–1.49)
PROTHROMBIN TIME: 28.9 s — AB (ref 11.6–15.2)

## 2014-03-23 LAB — BASIC METABOLIC PANEL
Anion gap: 10 (ref 5–15)
BUN: 37 mg/dL — AB (ref 6–23)
CHLORIDE: 90 meq/L — AB (ref 96–112)
CO2: 36 mEq/L — ABNORMAL HIGH (ref 19–32)
Calcium: 8.7 mg/dL (ref 8.4–10.5)
Creatinine, Ser: 1.48 mg/dL — ABNORMAL HIGH (ref 0.50–1.35)
GFR calc Af Amer: 50 mL/min — ABNORMAL LOW (ref >90)
GFR calc non Af Amer: 43 mL/min — ABNORMAL LOW (ref >90)
Glucose, Bld: 156 mg/dL — ABNORMAL HIGH (ref 70–99)
POTASSIUM: 3.4 meq/L — AB (ref 3.7–5.3)
SODIUM: 136 meq/L — AB (ref 137–147)

## 2014-03-23 MED ORDER — TORSEMIDE 20 MG PO TABS
40.0000 mg | ORAL_TABLET | Freq: Two times a day (BID) | ORAL | Status: DC
  Administered 2014-03-23 – 2014-03-25 (×4): 40 mg via ORAL
  Filled 2014-03-23 (×6): qty 2

## 2014-03-23 MED ORDER — POTASSIUM CHLORIDE CRYS ER 20 MEQ PO TBCR
20.0000 meq | EXTENDED_RELEASE_TABLET | Freq: Two times a day (BID) | ORAL | Status: DC
  Administered 2014-03-23 – 2014-03-25 (×5): 20 meq via ORAL
  Filled 2014-03-23 (×7): qty 1

## 2014-03-23 MED ORDER — WARFARIN SODIUM 2 MG PO TABS
2.0000 mg | ORAL_TABLET | Freq: Once | ORAL | Status: AC
  Administered 2014-03-23: 2 mg via ORAL
  Filled 2014-03-23: qty 1

## 2014-03-23 MED ORDER — POTASSIUM CHLORIDE CRYS ER 20 MEQ PO TBCR
40.0000 meq | EXTENDED_RELEASE_TABLET | Freq: Once | ORAL | Status: AC
  Administered 2014-03-23: 40 meq via ORAL
  Filled 2014-03-23: qty 2

## 2014-03-23 NOTE — Progress Notes (Signed)
Advanced Heart Failure Rounding Note   Subjective:    [name] is a 80 y.o. male w/ PMHx significant for NICM with EF of 20% on chronic milrinone, HTN, pulm HTN, chronic renal insufficiency (baseline CR 1.8), retroperitoneal mass and PAF.  Just recently discharged 9/18 after AKI. Discharged weight was 160 lbs. Called last week about PICC line being out 7 cm and was scheduled for PICC exchange which he underwent (9/24).  Presented to the ED 9/27 with fevers 103 and erythema around PICC. Concern for sepsis and PICC exchanged. CT abdomen (9/28): increasing infiltration and atelectasis in the lung bases possible PNA. PICC discontinued 9/27. BCX MSSA x 2. Echo EF 20%. No obvious vegetation. Surveillance bcx remain negative.   Remains on milrinone 0.375 mcg. Renal function backed to baseline. IV diuretics continued yesterday. Weight stable at 170 (which is up 7 pounds from baseline. )Continues on cefipime for PNA and MSSA bacteremia. Feels good. No dyspnea. Stronger. Able to stand up easily and walk.   Long discussion with him and his wife about prognosis and EOL issues. He wants to continue with aggressive care for now including inotropes. Not interested in SNF or Hospice.   Objective:   Weight Range:  Vital Signs:   Temp:  [97.3 F (36.3 C)-98.5 F (36.9 C)] 97.4 F (36.3 C) (10/04 0802) Pulse Rate:  [78-91] 91 (10/04 1055) Resp:  [14-25] 17 (10/04 1055) BP: (101-119)/(48-55) 101/49 mmHg (10/04 1055) SpO2:  [82 %-98 %] 93 % (10/04 1055) Weight:  [77.3 kg (170 lb 6.7 oz)] 77.3 kg (170 lb 6.7 oz) (10/04 0500) Last BM Date: 03/22/14  Weight change: Filed Weights   03/21/14 0413 03/22/14 0300 03/23/14 0500  Weight: 80.74 kg (178 lb) 77.5 kg (170 lb 13.7 oz) 77.3 kg (170 lb 6.7 oz)    Intake/Output:   Intake/Output Summary (Last 24 hours) at 03/23/14 1122 Last data filed at 03/23/14 0848  Gross per 24 hour  Intake    730 ml  Output   2526 ml  Net  -1796 ml     Physical  Exam: General:  Elderly appearing. No resp difficulty, on 2L [place] HEENT: normal Neck: supple. JVP 7-8 . Carotids 2+ bilat; no bruits. No lymphadenopathy or thryomegaly appreciated. Cor: PMI laterally displaced placed. Distant regular Lungs: diminished in the bases. Abdomen: tender, + distended. No hepatosplenomegaly. No bruits or masses. Good bowel sounds. Extremities: no cyanosis, clubbing, rash, 2+ edema, R arm erythema and warm to touch and edematous Neuro: alert & orientedx3, cranial nerves grossly intact. moves all 4 extremities w/o difficulty. Affect pleasant  Telemetry: V paced 90s  Labs: Basic Metabolic Panel:  Recent Labs Lab 03/19/14 0343 03/20/14 0330 03/21/14 0255 03/22/14 0253 03/23/14 0247  NA 136* 138 137 140 136*  K 4.5 4.7 3.9 4.0 3.4*  CL 98 98 97 97 90*  CO2 26 27 27  32 36*  GLUCOSE 127* 146* 132* 131* 156*  BUN 41* 41* 38* 37* 37*  CREATININE 1.54* 1.53* 1.40* 1.37* 1.48*  CALCIUM 8.5 8.4 8.1* 8.2* 8.7  MG 2.1  --   --   --   --     Liver Function Tests:  Recent Labs Lab 03/16/14 1615 03/17/14 0442  AST 29 51*  ALT 18 31  ALKPHOS 107 106  BILITOT 0.9 0.7  PROT 7.3 6.2  ALBUMIN 3.4* 2.8*    Recent Labs Lab 03/16/14 2116  LIPASE 11   No results found for this basename: AMMONIA,  in the last 168  hours  CBC:  Recent Labs Lab 03/16/14 1615 03/17/14 0442 03/19/14 0343 03/20/14 0330 03/22/14 0253 03/23/14 0247  WBC 25.7* 25.6* 14.0* 12.8* 14.2* 14.0*  NEUTROABS 24.4* 23.8* 11.8* 10.5*  --   --   HGB 11.9* 10.0* 10.2* 9.9* 10.4* 11.3*  HCT 35.2* 31.5* 31.7* 30.4* 31.6* 34.0*  MCV 94.1 95.2 97.5 94.1 95.8 92.9  PLT 281 235 251 266 336 399    Cardiac Enzymes: No results found for this basename: CKTOTAL, CKMB, CKMBINDEX, TROPONINI,  in the last 168 hours  BNP: BNP (last 3 results)  Recent Labs  11/07/13 0708 03/01/14 1959 03/08/14 2230  PROBNP 9356.0* 11880.0* 15334.0*     Other results:  EKG:   Imaging: No results  found.   Medications:     Scheduled Medications: . acidophilus  2 capsule Oral Daily  . amiodarone  200 mg Oral BID  . atorvastatin  20 mg Oral q1800  . budesonide (PULMICORT) nebulizer solution  0.25 mg Nebulization BID  . ceFEPime (MAXIPIME) IV  1 g Intravenous Q24H  . feeding supplement (ENSURE COMPLETE)  237 mL Oral TID BM  . furosemide  80 mg Intravenous BID  . loratadine  10 mg Oral Daily  . magnesium oxide  400 mg Oral Daily  . potassium chloride SA  20 mEq Oral Daily  . sildenafil  20 mg Oral TID  . sodium chloride  3 mL Intravenous Q12H  . warfarin  2 mg Oral ONCE-1800  . Warfarin - Pharmacist Dosing Inpatient   Does not apply q1800    Infusions: . sodium chloride 2 mL/hr (03/20/14 0130)  . 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 is well known to the HF team and was just admitted for AKI and abdominal distention. He went home on 9/18 and called the HF clinic last week d/t PICC being out 7 cm. Underwent exchange 9/24 and subsequently presented to the ED on 9/27 with fevers and erythema around PICC line. Bcx 2/2 MSSA. He also has PNA noted on CT.   Bcx 2/2 for MSSA. ID has seen. Infectious symptoms improving. Will need to replace PICC line once surveillance cultures are finalized. Will likely place power picc in RIJ line on Monday with IR Continue abx per ID (thanks). WBC stabilized today. No fever. Will need to watch closely.   Volume status improved. Switch back to torsemide. Try 40 bid   INR 2.7. Pharmacy  adjusting (appreciate their assistance)  Plan will be d/c home early next week with milrinone.   Will discuss case with Tyler Christensen to see if she can help with Palliative Services.   Tyler Bickers  MD   03/23/2014

## 2014-03-23 NOTE — Progress Notes (Signed)
[facility] PROGRESS NOTE Interim History: Tyler Christensen is a 80 y.o. male w/ PMHx significant for NICM with EF of 20% on chronic milrinone, HTN, pulm HTN, chronic renal insufficiency (baseline CR 1.8), retroperitoneal mass and PAF.Presented to the ED 9/27 with fevers 103 and erythema around PICC. Concern for sepsis.PICC discontinued 9/27 d/t blood cultures Bcx 2/2 with MSSA. CXR showed possible HCAP, started on vanc and cefepime, then de-escalated to cefepime TTE negative ID recommended to cont Cefepime for 2 weeks. New picc line on 10.5.2015.  Filed Weights   03/21/14 0413 03/22/14 0300 03/23/14 0500  Weight: 80.74 kg (178 lb) 77.5 kg (170 lb 13.7 oz) 77.3 kg (170 lb 6.7 oz)        Intake/Output Summary (Last 24 hours) at 03/23/14 0714 Last data filed at 03/23/14 0600  Gross per 24 hour  Intake    970 ml  Output   2726 ml  Net  -1756 ml     Assessment/Plan: Severe sepsis due to MSSA bacteremia and HCAP: - Multifactorial secondary to MSSA bacteremia (due to PICC line) and probable HCAP. repeat blood cultures negative till date. - Insert PICC line on .10.5.2015 - Started on vanc and cefepime, Vanc d/c,Sensitivities c/w MSSA. - ID consulted. TTE does not show abnormality  - cont cefepime for 2 weeks ( 6/14 days) and repeat blood cultures in 1 week after completing treatment. - C. Dif negative.  Acute respiratory failure with hypoxia due to acute on chronic systolic heart failure - Secondary to acute on chronic CHF exacerbation and  HCAP.  - Patient currently on milnirone.  - On Torsemide and metolazone per heart failure team. Improved UOP - Advance heart failure team on board.  Nausea and emesis  - due to acute decompensated HF and sepsis, now resolved - Cardiac enzymes negative.  Acute on chronic kidney disease stage III  - Elevated creatinine was likely secondary  Heart failure. - Renal function cont to improve with HF treatment.  Coagulopathy  - Coumadin per  pharmacy. INR now at 3.3. Coumadin dosing per pharmacy.  Paroxysmal atrial fibrillation  - Currently rate controlled on amiodarone. INR is therapeutic cont per pharmacy.  pulmonary hypertension  Continue slidenafil.   Severe protein caloric malnutrition: - cont ensure.  Diarrhea  C. difficile PCR pending. WBC trending down.  Code Status: full Family Communication: wife  Disposition Plan: inpatient   Consultants:  Cardiology  ID  Procedures: ECHO: pending  Antibiotics:  Vanc 9.28.2015>>>9.30.2015  cefepime 9.28.2015>>  HPI/Subjective: Had a good night sleep. Feels better than he has felt in the last couple of days.   Objective: Filed Vitals:   03/22/14 1953 03/23/14 0500 03/23/14 0600 03/23/14 0605  BP:  105/50    Pulse:  82 78 80  Temp:  98.2 F (36.8 C)    TempSrc:  Oral    Resp:  20 21 14   Height:      Weight:  77.3 kg (170 lb 6.7 oz)    SpO2: 93% 90% 82% 93%     Exam:  General: Alert, awake, oriented x3, in no acute distress.  HEENT: +JVD Heart: Regular rate and rhythm.3+ edema Lungs: Good air movement, clear Abdomen: Soft, nontender, distended abdomen positive bowel sounds.     Data Reviewed: Basic Metabolic Panel:  Recent Labs Lab 03/19/14 0343 03/20/14 0330 03/21/14 0255 03/22/14 0253 03/23/14 0247  NA 136* 138 137 140 136*  K 4.5 4.7 3.9 4.0 3.4*  CL 98 98 97 97 90*  CO2  26 27 27  32 36*  GLUCOSE 127* 146* 132* 131* 156*  BUN 41* 41* 38* 37* 37*  CREATININE 1.54* 1.53* 1.40* 1.37* 1.48*  CALCIUM 8.5 8.4 8.1* 8.2* 8.7  MG 2.1  --   --   --   --    Liver Function Tests:  Recent Labs Lab 03/16/14 1615 03/17/14 0442  AST 29 51*  ALT 18 31  ALKPHOS 107 106  BILITOT 0.9 0.7  PROT 7.3 6.2  ALBUMIN 3.4* 2.8*    Recent Labs Lab 03/16/14 2116  LIPASE 11   No results found for this basename: AMMONIA,  in the last 168 hours CBC:  Recent Labs Lab 03/16/14 1615 03/17/14 0442 03/19/14 0343 03/20/14 0330  03/22/14 0253 03/23/14 0247  WBC 25.7* 25.6* 14.0* 12.8* 14.2* 14.0*  NEUTROABS 24.4* 23.8* 11.8* 10.5*  --   --   HGB 11.9* 10.0* 10.2* 9.9* 10.4* 11.3*  HCT 35.2* 31.5* 31.7* 30.4* 31.6* 34.0*  MCV 94.1 95.2 97.5 94.1 95.8 92.9  PLT 281 235 251 266 336 399   Cardiac Enzymes: No results found for this basename: CKTOTAL, CKMB, CKMBINDEX, TROPONINI,  in the last 168 hours BNP (last 3 results)  Recent Labs  11/07/13 0708 03/01/14 1959 03/08/14 2230  PROBNP 9356.0* 11880.0* 15334.0*   CBG:  Recent Labs Lab 03/19/14 1726 03/19/14 2037 03/19/14 2351 03/20/14 0553 03/21/14 1703  GLUCAP 161* 153* 119* 121* 178*    Recent Results (from the past 240 hour(s))  CULTURE, BLOOD (ROUTINE X 2)     Status: None   Collection Time    03/16/14  4:15 PM      Result Value Ref Range Status   Specimen Description BLOOD BLOOD LEFT FOREARM   Final   Special Requests BOTTLES DRAWN AEROBIC AND ANAEROBIC 10CC EACH   Final   Culture  Setup Time     Final   Value: 03/17/2014 00:28     Performed at [facility]   Culture     Final   Value: STAPHYLOCOCCUS AUREUS     Note: RIFAMPIN AND GENTAMICIN SHOULD NOT BE USED AS SINGLE DRUGS FOR TREATMENT OF STAPH INFECTIONS.     Note: Gram Stain Report Called to,Read Back By and Verified With: J. Alvera Singh @ 10AM 9.28.15 DESIS Culture results may be compromised due to an excessive volume of blood received in culture bottles.     Performed at [facility]   Report Status 03/19/2014 FINAL   Final   Organism ID, Bacteria STAPHYLOCOCCUS AUREUS   Final  CULTURE, BLOOD (ROUTINE X 2)     Status: None   Collection Time    03/16/14  4:37 PM      Result Value Ref Range Status   Specimen Description BLOOD LEFT HAND   Final   Special Requests BOTTLES DRAWN AEROBIC AND ANAEROBIC [facility] EACH   Final   Culture  Setup Time     Final   Value: 03/17/2014 00:28     Performed at [facility]   Culture     Final   Value: STAPHYLOCOCCUS AUREUS      Note: SUSCEPTIBILITIES PERFORMED ON PREVIOUS CULTURE WITHIN THE LAST 5 DAYS.     Note: Gram Stain Report Called to,Read Back By and Verified With: Valente David @ 10AM 9.28.15 DESIS     Performed at [facility]   Report Status 03/19/2014 FINAL   Final  URINE CULTURE     Status: None   Collection Time  03/16/14  4:59 PM      Result Value Ref Range Status   Specimen Description URINE, CLEAN CATCH   Final   Special Requests NONE   Final   Culture  Setup Time     Final   Value: 03/17/2014 01:31     Performed at [facility]     Final   Value: NO GROWTH     Performed at [facility]   Culture     Final   Value: NO GROWTH     Performed at [facility]   Report Status 03/18/2014 FINAL   Final  MRSA PCR SCREENING     Status: None   Collection Time    03/17/14  6:22 AM      Result Value Ref Range Status   MRSA by PCR NEGATIVE  NEGATIVE Final   Comment:            The GeneXpert MRSA Assay (FDA     approved for NASAL specimens     only), is one component of a     comprehensive MRSA colonization     surveillance program. It is not     intended to diagnose MRSA     infection nor to guide or     monitor treatment for     MRSA infections.  CULTURE, BLOOD (ROUTINE X 2)     Status: None   Collection Time    03/18/14  4:50 PM      Result Value Ref Range Status   Specimen Description BLOOD RIGHT ARM   Final   Special Requests BOTTLES DRAWN AEROBIC ONLY 2CC   Final   Culture  Setup Time     Final   Value: 03/18/2014 22:28     Performed at [facility]   Culture     Final   Value:        BLOOD CULTURE RECEIVED NO GROWTH TO DATE CULTURE WILL BE HELD FOR 5 DAYS BEFORE ISSUING A FINAL NEGATIVE REPORT     Performed at [facility]   Report Status PENDING   Incomplete  CLOSTRIDIUM DIFFICILE BY PCR     Status: None   Collection Time    03/19/14  6:00 PM      Result Value Ref Range Status   C difficile by pcr NEGATIVE  NEGATIVE  Final  CLOSTRIDIUM DIFFICILE BY PCR     Status: None   Collection Time    03/22/14  2:29 PM      Result Value Ref Range Status   C difficile by pcr NEGATIVE  NEGATIVE Final     Studies: No results found.  Scheduled Meds: . acidophilus  2 capsule Oral Daily  . amiodarone  200 mg Oral BID  . atorvastatin  20 mg Oral q1800  . budesonide (PULMICORT) nebulizer solution  0.25 mg Nebulization BID  . ceFEPime (MAXIPIME) IV  1 g Intravenous Q24H  . feeding supplement (ENSURE COMPLETE)  237 mL Oral TID BM  . furosemide  80 mg Intravenous BID  . loratadine  10 mg Oral Daily  . magnesium oxide  400 mg Oral Daily  . potassium chloride SA  20 mEq Oral Daily  . sildenafil  20 mg Oral TID  . sodium chloride  3 mL Intravenous Q12H  . Warfarin - Pharmacist Dosing Inpatient   Does not apply q1800   Continuous Infusions: . sodium chloride 2 mL/hr (03/20/14 0130)  . milrinone  0.375 mcg/kg/min (03/23/14 0050)     Tyler Christensen  [facility] Pager [phone] If 8PM-8AM, please contact night-coverage at [email or web address], password [facility] 03/23/2014, 7:14 AM  LOS: 7 days     **Disclaimer: This note may have been dictated with voice recognition software. Similar sounding words can inadvertently be transcribed and this note may contain transcription errors which may not have been corrected upon publication of note.**

## 2014-03-23 NOTE — Progress Notes (Signed)
03/23/14 1400 Per MD okay to leave IV. Patient to have PICC line placed 03/24/14

## 2014-03-23 NOTE — Progress Notes (Signed)
ANTICOAGULATION CONSULT NOTE - Follow Up Consult  Pharmacy Consult for Coumadin  Indication: atrial fibrillation   Allergies  Allergen Reactions  . Lisinopril Rash    Patient Measurements: Height: 5\' 8"  (172.7 cm) Weight: 170 lb 6.7 oz (77.3 kg) IBW/kg (Calculated) : 68.4  Vital Signs: Temp: 97.4 F (36.3 C) (10/04 0802) Temp Source: Oral (10/04 0802) BP: 119/55 mmHg (10/04 0802) Pulse Rate: 85 (10/04 0802)  Labs:  Recent Labs  03/21/14 0255 03/22/14 0253 03/23/14 0247  HGB  --  10.4* 11.3*  HCT  --  31.6* 34.0*  PLT  --  336 399  LABPROT 30.2* 33.6* 28.9*  INR 2.88* 3.31* 2.73*  CREATININE 1.40* 1.37* 1.48*    Estimated Creatinine Clearance: 39.2 ml/min (by C-G formula based on Cr of 1.48).  Assessment: Tyler Christensen continues on coumadin for afib - on amio. INR was elevated on admit and he received vitamin K 2.5mg  x 1. Coumadin resumed,  INR 1.86>>2.24>>2.88 > 3.3>2.7 ( after holding dose last pm) He seems to be very sensitive to even small doses of warfarin . Hgb stable, no s/sx bleeding. Home dose: 4mg  daily except 2mg  Friday and Sunday - will need lower dose at d/c  Goal of Therapy:  INR 2-3 Monitor platelets by anticoagulation protocol: Yes   Plan:  1) Coumadin 2mg  x1  today 2) Daily INR, monitor Hgb  Bonnita Nasuti Pharm.D. CPP, BCPS Clinical Pharmacist [phone] 03/23/2014 10:41 AM

## 2014-03-24 ENCOUNTER — Inpatient Hospital Stay (HOSPITAL_COMMUNITY): Payer: Medicare Other

## 2014-03-24 LAB — CULTURE, BLOOD (ROUTINE X 2): Culture: NO GROWTH

## 2014-03-24 LAB — PROTIME-INR
INR: 2.38 — AB (ref 0.00–1.49)
PROTHROMBIN TIME: 26 s — AB (ref 11.6–15.2)

## 2014-03-24 LAB — BASIC METABOLIC PANEL
ANION GAP: 11 (ref 5–15)
BUN: 42 mg/dL — AB (ref 6–23)
CO2: 36 mEq/L — ABNORMAL HIGH (ref 19–32)
Calcium: 9.1 mg/dL (ref 8.4–10.5)
Chloride: 91 mEq/L — ABNORMAL LOW (ref 96–112)
Creatinine, Ser: 1.67 mg/dL — ABNORMAL HIGH (ref 0.50–1.35)
GFR, EST AFRICAN AMERICAN: 43 mL/min — AB (ref >90)
GFR, EST NON AFRICAN AMERICAN: 37 mL/min — AB (ref >90)
Glucose, Bld: 180 mg/dL — ABNORMAL HIGH (ref 70–99)
POTASSIUM: 4.1 meq/L (ref 3.7–5.3)
Sodium: 138 mEq/L (ref 137–147)

## 2014-03-24 MED ORDER — HEPARIN SOD (PORK) LOCK FLUSH 100 UNIT/ML IV SOLN
INTRAVENOUS | Status: AC
  Filled 2014-03-24: qty 5

## 2014-03-24 MED ORDER — MORPHINE SULFATE 2 MG/ML IJ SOLN
2.0000 mg | Freq: Once | INTRAMUSCULAR | Status: AC
  Administered 2014-03-24: 2 mg via INTRAVENOUS

## 2014-03-24 MED ORDER — LIDOCAINE HCL 1 % IJ SOLN
INTRAMUSCULAR | Status: AC
  Filled 2014-03-24: qty 20

## 2014-03-24 MED ORDER — MORPHINE SULFATE 2 MG/ML IJ SOLN
INTRAMUSCULAR | Status: AC
  Filled 2014-03-24: qty 1

## 2014-03-24 MED ORDER — CEFAZOLIN SODIUM-DEXTROSE 2-3 GM-% IV SOLR
2.0000 g | Freq: Two times a day (BID) | INTRAVENOUS | Status: DC
  Administered 2014-03-24: 2 g via INTRAVENOUS
  Filled 2014-03-24 (×2): qty 50

## 2014-03-24 MED ORDER — FENTANYL CITRATE 0.05 MG/ML IJ SOLN
INTRAMUSCULAR | Status: AC
  Filled 2014-03-24: qty 2

## 2014-03-24 MED ORDER — NITROGLYCERIN 0.4 MG SL SUBL
SUBLINGUAL_TABLET | SUBLINGUAL | Status: AC
  Administered 2014-03-24: 0.4 mg
  Filled 2014-03-24: qty 3

## 2014-03-24 MED ORDER — MIDAZOLAM HCL 2 MG/2ML IJ SOLN
INTRAMUSCULAR | Status: AC
  Filled 2014-03-24: qty 4

## 2014-03-24 MED ORDER — SODIUM CHLORIDE 0.9 % IJ SOLN
10.0000 mL | INTRAMUSCULAR | Status: DC | PRN
  Administered 2014-03-24 – 2014-04-05 (×18): 10 mL

## 2014-03-24 MED ORDER — ALBUTEROL SULFATE (2.5 MG/3ML) 0.083% IN NEBU
INHALATION_SOLUTION | RESPIRATORY_TRACT | Status: AC
  Filled 2014-03-24: qty 3

## 2014-03-24 MED ORDER — FUROSEMIDE 10 MG/ML IJ SOLN
80.0000 mg | Freq: Once | INTRAMUSCULAR | Status: AC
  Administered 2014-03-24: 80 mg via INTRAVENOUS

## 2014-03-24 MED ORDER — FENTANYL CITRATE 0.05 MG/ML IJ SOLN
INTRAMUSCULAR | Status: DC | PRN
  Administered 2014-03-24: 25 ug via INTRAVENOUS

## 2014-03-24 MED ORDER — FUROSEMIDE 10 MG/ML IJ SOLN
INTRAMUSCULAR | Status: AC
  Filled 2014-03-24: qty 8

## 2014-03-24 MED ORDER — SODIUM CHLORIDE 0.9 % IJ SOLN
10.0000 mL | Freq: Two times a day (BID) | INTRAMUSCULAR | Status: DC
  Administered 2014-04-01 – 2014-04-04 (×6): 10 mL

## 2014-03-24 MED ORDER — MIDAZOLAM HCL 2 MG/2ML IJ SOLN
INTRAMUSCULAR | Status: DC | PRN
  Administered 2014-03-24: 1 mg via INTRAVENOUS

## 2014-03-24 MED ORDER — WARFARIN SODIUM 3 MG PO TABS
3.0000 mg | ORAL_TABLET | Freq: Once | ORAL | Status: AC
  Administered 2014-03-24: 3 mg via ORAL
  Filled 2014-03-24: qty 1

## 2014-03-24 MED ORDER — CEFAZOLIN SODIUM-DEXTROSE 2-3 GM-% IV SOLR
2.0000 g | Freq: Two times a day (BID) | INTRAVENOUS | Status: DC
  Administered 2014-03-25 – 2014-04-01 (×15): 2 g via INTRAVENOUS
  Filled 2014-03-24 (×17): qty 50

## 2014-03-24 NOTE — Progress Notes (Signed)
Received call from [facility] in IR saying that patient was having trouble breathing and oxygen saturation 95% on non-rebreather.  Dr. Aileen Fass paged and came to patient's bedside.  See new orders.  Will continue to monitor patient.

## 2014-03-24 NOTE — Progress Notes (Signed)
Pt's 10am meds given using interpreter phone to answer all of pt's questions regarding meds.  Pt was concerned about taking all meds on an empty stomach.  Pt. NPO for PICC placement later today in IR.  Explained to pt that he needed heart meds and diuretics. Pt given half can of ensure with meds so he "would not be sick".  All questions answered to pt satisfaction using interpreter.

## 2014-03-24 NOTE — Progress Notes (Signed)
Advanced Heart Failure Rounding Note   Subjective:    [name] is a 80 y.o. male w/ PMHx significant for NICM with EF of 20% on chronic milrinone, HTN, pulm HTN, chronic renal insufficiency (baseline CR 1.8), retroperitoneal mass and PAF.  Just recently discharged 9/18 after AKI. Discharged weight was 160 lbs. Called last week about PICC line being out 7 cm and was scheduled for PICC exchange which he underwent (9/24).  Presented to the ED 9/27 with fevers 103 and erythema around PICC. Concern for sepsis and PICC exchanged. CT abdomen (9/28): increasing infiltration and atelectasis in the lung bases possible PNA. PICC discontinued 9/27. BCX MSSA x 2. Echo EF 20%. No obvious vegetation. Surveillance bcx remain negative.   Remains on milrinone 0.375 mcg. Renal function backed to baseline. IV diuretics continued yesterday. Weight stable at 167 (which is up 4 pounds from baseline) Continues on cefipime for PNA and MSSA bacteremia.   Denies SOB.   Long discussion with him and his wife about prognosis and EOL issues. He wants to continue with aggressive care for now including inotropes. Not interested in SNF or Hospice.  Creatinine 1.4> 1.6  Objective:   Weight Range:  Vital Signs:   Temp:  [97.3 F (36.3 C)-97.9 F (36.6 C)] 97.4 F (36.3 C) (10/05 0300) Pulse Rate:  [81-91] 86 (10/05 0300) Resp:  [17-30] 19 (10/05 0300) BP: (94-119)/(38-55) 97/55 mmHg (10/05 0300) SpO2:  [92 %-99 %] 96 % (10/05 0300) Weight:  [167 lb 12.8 oz (76.114 kg)] 167 lb 12.8 oz (76.114 kg) (10/05 0300) Last BM Date: 03/23/14  Weight change: Filed Weights   03/22/14 0300 03/23/14 0500 03/24/14 0300  Weight: 170 lb 13.7 oz (77.5 kg) 170 lb 6.7 oz (77.3 kg) 167 lb 12.8 oz (76.114 kg)    Intake/Output:   Intake/Output Summary (Last 24 hours) at 03/24/14 0732 Last data filed at 03/24/14 0600  Gross per 24 hour  Intake    248 ml  Output   1650 ml  Net  -1402 ml     Physical Exam: General:  Elderly  appearing. No resp difficulty, on 2L [place] Sitting in chair.  HEENT: normal Neck: supple. JVP 5-6 Carotids 2+ bilat; no bruits. No lymphadenopathy or thryomegaly appreciated. Cor: PMI laterally displaced + S3 placed. Distant regular Lungs: diminished in the bases. Abdomen: tender, + distended. No hepatosplenomegaly. No bruits or masses. Good bowel sounds. Extremities: no cyanosis, clubbing, rash, 2+ edema, R arm erythema and warm to touch and edematous Neuro: alert & orientedx3, cranial nerves grossly intact. moves all 4 extremities w/o difficulty. Affect pleasant  Telemetry: V paced 90s  Labs: Basic Metabolic Panel:  Recent Labs Lab 03/19/14 0343 03/20/14 0330 03/21/14 0255 03/22/14 0253 03/23/14 0247 03/24/14 0230  NA 136* 138 137 140 136* 138  K 4.5 4.7 3.9 4.0 3.4* 4.1  CL 98 98 97 97 90* 91*  CO2 26 27 27  32 36* 36*  GLUCOSE 127* 146* 132* 131* 156* 180*  BUN 41* 41* 38* 37* 37* 42*  CREATININE 1.54* 1.53* 1.40* 1.37* 1.48* 1.67*  CALCIUM 8.5 8.4 8.1* 8.2* 8.7 9.1  MG 2.1  --   --   --   --   --     Liver Function Tests: No results found for this basename: AST, ALT, ALKPHOS, BILITOT, PROT, ALBUMIN,  in the last 168 hours No results found for this basename: LIPASE, AMYLASE,  in the last 168 hours No results found for this basename: AMMONIA,  in the  last 168 hours  CBC:  Recent Labs Lab 03/19/14 0343 03/20/14 0330 03/22/14 0253 03/23/14 0247  WBC 14.0* 12.8* 14.2* 14.0*  NEUTROABS 11.8* 10.5*  --   --   HGB 10.2* 9.9* 10.4* 11.3*  HCT 31.7* 30.4* 31.6* 34.0*  MCV 97.5 94.1 95.8 92.9  PLT 251 266 336 399    Cardiac Enzymes: No results found for this basename: CKTOTAL, CKMB, CKMBINDEX, TROPONINI,  in the last 168 hours  BNP: BNP (last 3 results)  Recent Labs  11/07/13 0708 03/01/14 1959 03/08/14 2230  PROBNP 9356.0* 11880.0* 15334.0*     Other results:  EKG:   Imaging: No results found.   Medications:     Scheduled Medications: .  acidophilus  2 capsule Oral Daily  . amiodarone  200 mg Oral BID  . atorvastatin  20 mg Oral q1800  . budesonide (PULMICORT) nebulizer solution  0.25 mg Nebulization BID  . ceFEPime (MAXIPIME) IV  1 g Intravenous Q24H  . feeding supplement (ENSURE COMPLETE)  237 mL Oral TID BM  . loratadine  10 mg Oral Daily  . magnesium oxide  400 mg Oral Daily  . potassium chloride SA  20 mEq Oral BID  . sildenafil  20 mg Oral TID  . sodium chloride  3 mL Intravenous Q12H  . torsemide  40 mg Oral BID  . Warfarin - Pharmacist Dosing Inpatient   Does not apply q1800    Infusions: . sodium chloride 2 mL/hr (03/20/14 0130)  . 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 Michalik is well known to the HF team and was just admitted for AKI and abdominal distention. He went home on 9/18 and called the HF clinic last week d/t PICC being out 7 cm. Underwent exchange 9/24 and subsequently presented to the ED on 9/27 with fevers and erythema around PICC line. Bcx 2/2 MSSA. He also has PNA noted on CT.   Bcx 2/2 for MSSA. ID has seen. Infectious symptoms improving.  Place power picc in RIJ line today  IR Continue abx per ID (thanks). . No fever. Will need to watch closely.   Volume status improved. Continue Torsemide. 40 mg twice a day.   INR 2.38 Pharmacy adjusting (appreciate their assistance)  Anticipate d/c tomorrow. [facility] to resume care for home milrinone.   Will discuss case with Rosanne Sack to see if she can help with Palliative Services.   CLEGG,[name]  NP-C   03/24/2014   Patient seen and examined with Darrick Grinder, NP. We discussed all aspects of the encounter. I agree with the assessment and plan as stated above.   Will place RIJ line today with IR. Continue diuretics. Can go to tele. Hopefully home in am.  Benay Spice 9:33 AM

## 2014-03-24 NOTE — Progress Notes (Signed)
ANTICOAGULATION CONSULT NOTE - Follow Up Consult  Pharmacy Consult for Coumadin  Indication: atrial fibrillation   Allergies  Allergen Reactions  . Lisinopril Rash    Patient Measurements: Height: 5\' 8"  (172.7 cm) Weight: 167 lb 12.8 oz (76.114 kg) IBW/kg (Calculated) : 68.4  Vital Signs: Temp: 97.4 F (36.3 C) (10/05 0300) Temp Source: Oral (10/05 0300) BP: 97/55 mmHg (10/05 0300) Pulse Rate: 86 (10/05 0300)  Labs:  Recent Labs  03/22/14 0253 03/23/14 0247 03/24/14 0230  HGB 10.4* 11.3*  --   HCT 31.6* 34.0*  --   PLT 336 399  --   LABPROT 33.6* 28.9* 26.0*  INR 3.31* 2.73* 2.38*  CREATININE 1.37* 1.48* 1.67*    Estimated Creatinine Clearance: 34.7 ml/min (by C-G formula based on Cr of 1.67).  Assessment: 81yom continues on coumadin for afib - on amio. INR was elevated on admit and he received vitamin K 2.5mg  x 1. Coumadin resumed,  INR now trending down (2.3 today). He seems to be very sensitive to even small doses of warfarin . Hgb stable, no s/sx bleeding. For PICC today.  Home dose: 4mg  daily except 2mg  Friday and Sunday - will need lower dose at d/c  Goal of Therapy:  INR 2-3 Monitor platelets by anticoagulation protocol: Yes   Plan:  1) Coumadin 3mg  tonight 2) Daily INR, monitor Hgb  Erin Hearing PharmD., BCPS Clinical Pharmacist Pager [phone] 03/24/2014 12:04 PM

## 2014-03-24 NOTE — Sedation Documentation (Signed)
Patient working hard breathing. Patient moved over to bed and set up.

## 2014-03-24 NOTE — Sedation Documentation (Signed)
Patient was anxious and SOB and meds were given and noe patient is resting comfortably.

## 2014-03-24 NOTE — Procedures (Signed)
Procedure:  Right IJ vein tunneled CVC placement Access:  Right IJ vein 6 Fr DL Power line placed via right IJ vein.  Tip at cavoatrial junction.  OK to use.

## 2014-03-24 NOTE — Progress Notes (Signed)
BP 99/53  Pulse 104  Temp(Src) 97.6 F (36.4 C) (Oral)  Resp 28  Ht 5\' 8"  (1.727 m)  Wt 76.1 kg (167 lb 12.3 oz)  BMI 25.52 kg/m2  SpO2 89% Was called by the nurse as the patient was desaturating after central line was placed by interventional radiologist. He had to be put in the supine position for central line placement. His respiratory distress and hypoxia by sitting the patient upright, he continues to have labored breathing using accessory muscles. Crackles in all lung exam, was coughing a little bit of pink frothy sputum. Place him on a nonrebreather mask and saturations remained at 89%. He was given Lasix, nitroglycerin sublingual, morphine and Xopenex treatment. His tachycardia improved his saturations returned to 95%. Place him on a nasal cannula. We'll monitor strict I.'s and O.'s will need a basic metabolic panel in the morning.

## 2014-03-24 NOTE — H&P (Signed)
Tyler Christensen is an 80 y.o. male.   Chief Complaint: Heart failure HPI: Patient here for tunneled CV access for chronic milrinone therapy.  Past Medical History  Diagnosis Date  . HTN (hypertension)   . High cholesterol   . CHF (congestive heart failure)   . BBBB (bilateral bundle branch block)   . LBBB (left bundle branch block)   . Seizures 06/11/2012    new onset/notes (06/11/2012)  . SOB (shortness of breath)     "sometimes when I lay down;; related to not taking my RX" (06/11/2012)  . Myocardial infarction     06/10/2012  . Atrial fibrillation   . Stroke   . Atrial thrombus     left  . Pulmonary HTN     Past Surgical History  Procedure Laterality Date  . Throat surgery  1942    "and nose" (06/11/2012); ?T&A  . Inguinal hernia repair  ~ 2008    "both sides" (06/11/2012)  . Tee without cardioversion  06/29/2012    Procedure: TRANSESOPHAGEAL ECHOCARDIOGRAM (TEE);  Surgeon: Jolaine Artist, MD;  Location: [facility] ENDOSCOPY;  Service: Cardiovascular;  Laterality: N/A;  will need a spanish interpreter Interpreter will be here at 1330-Hope spoke w/ [place]  . Cardioversion N/A 08/14/2012    Procedure: CARDIOVERSION;  Surgeon: Jolaine Artist, MD;  Location: [facility] ENDOSCOPY;  Service: Cardiovascular;  Laterality: N/A;  . Tee without cardioversion N/A 11/12/2013    Procedure: TRANSESOPHAGEAL ECHOCARDIOGRAM (TEE);  Surgeon: Candee Furbish, MD;  Location: [facility] ENDOSCOPY;  Service: Cardiovascular;  Laterality: N/A;  . Tee without cardioversion N/A 11/19/2013    Procedure: TRANSESOPHAGEAL ECHOCARDIOGRAM (TEE);  Surgeon: Jolaine Artist, MD;  Location: [facility] ENDOSCOPY;  Service: Cardiovascular;  Laterality: N/A;  . Cardioversion N/A 12/27/2013    Procedure: CARDIOVERSION;  Surgeon: Jolaine Artist, MD;  Location: [facility] ENDOSCOPY;  Service: Cardiovascular;  Laterality: N/A;    History reviewed. No pertinent family history. Social History:  reports that he has never smoked. He has never used  smokeless tobacco. He reports that he does not drink alcohol or use illicit drugs.  Allergies:  Allergies  Allergen Reactions  . Lisinopril Rash    Medications Prior to Admission  Medication Sig Dispense Refill  . amiodarone (PACERONE) 200 MG tablet Take 1 tablet (200 mg total) by mouth 2 (two) times daily.  60 tablet  6  . atorvastatin (LIPITOR) 20 MG tablet Take 1 tablet (20 mg total) by mouth daily at 6 PM.  30 tablet  6  . furosemide (LASIX) 40 MG tablet Take 40 mg by mouth 3 (three) times daily.       Marland Kitchen loratadine (CLARITIN) 10 MG tablet Take 10 mg by mouth daily.      . magnesium oxide (MAG-OX) 400 MG tablet Take 400 mg by mouth daily.      . metolazone (ZAROXOLYN) 2.5 MG tablet Take 2.5 mg by mouth daily.      Marland Kitchen oxyCODONE 10 MG TABS Take 1 tablet (10 mg total) by mouth every 4 (four) hours as needed for severe pain.  30 tablet  0  . potassium chloride SA (K-DUR,KLOR-CON) 20 MEQ tablet Take 1 tablet (20 mEq total) by mouth daily.  30 tablet  3  . senna-docusate (SENOKOT-S) 8.6-50 MG per tablet Take 1 tablet by mouth at bedtime.  30 tablet  3  . sildenafil (REVATIO) 20 MG tablet Take 1 tablet (20 mg total) by mouth 3 (three) times daily.  90 tablet  3  . torsemide (  DEMADEX) 20 MG tablet Take 1 tablet (20 mg total) by mouth daily.  30 tablet  3  . traMADol (ULTRAM) 50 MG tablet Take 50 mg by mouth every 12 (twelve) hours as needed for moderate pain.      Marland Kitchen warfarin (COUMADIN) 4 MG tablet Take 2-4 mg by mouth daily. 66m daily except Friday and _0 /04/15  2:47 AM      Result Value Ref Range   Prothrombin Time 28.9 (*) 11.6 - 15.2 seconds   INR 2.73 (*) 0.00 - 11.61 BASIC METABOLIC PANEL     Status: Abnormal   Collection Time    03/23/14  2:47 AM      Result Value Ref Range   Sodium 136 (*) 137 - 147 mEq/L   Potassium 3.4 (*) 3.7 - 5.3 mEq/L   Chloride 90 (*) 96 - 112 mEq/L   CO2 36 (*) 19 - 32 mEq/L   Glucose, Bld 156 (*) 70 - 99 mg/dL   BUN 37 (*) 6 - 23 mg/dL   Creatinine, Ser 1.48 (*) 0.50 - 1.35 mg/dL   Calcium 8.7  8.4 - 10.5 mg/dL   GFR calc non Af Amer 43 (*) >90 mL/min   GFR calc Af Amer 50 (*) >90 mL/min   Comment: (NOTE)     The eGFR has been calculated using the CKD EPI equation.     This calculation has not been validated in all clinical situations.     eGFR's persistently <90 mL/min signify possible Chronic Kidney     Disease.   Anion gap 10  5 - 15  CBC     Status: Abnormal   Collection Time    03/23/14  2:47 AM      Result Value Ref Range   WBC 14.0 (*) 4.0 - 10.5 K/uL   RBC 3.66 (*) 4.22 - 5.81 MIL/uL   Hemoglobin 11.3 (*) 13.0 - 17.0 g/dL   HCT 34.0 (*) 39.0 - 52.0 %   MCV 92.9  78.0 - 100.0 fL   MCH 30.9  26.0 - 34.0 pg   MCHC 33.2  30.0 - 36.0 g/dL   RDW 15.9 (*) 11.5 - 15.5 %   Platelets 399  150 - 400 K/uL  PROTIME-INR     Status: Abnormal   Collection Time    03/24/14  2:30 AM      Result Value Ref Range   Prothrombin Time 26.0 (*) 11.6 - 15.2 seconds   INR 2.38 (*) 0.00 - 10.96 BASIC METABOLIC PANEL     Status: Abnormal   Collection Time    03/24/14  2:30 AM      Result Value Ref Range   Sodium 138  137 - 147 mEq/L   Potassium 4.1  3.7 - 5.3 mEq/L   Comment: DELTA CHECK NOTED   Chloride 91 (*) 96 - 112 mEq/L   CO2 36 (*) 19 -  32 mEq/L   Glucose, Bld 180 (*) 70 - 99 mg/dL   BUN 42 (*) 6 - 23 mg/dL   Creatinine, Ser 1.67 (*) 0.50 - 1.35 mg/dL   Calcium 9.1  8.4 - 10.5 mg/dL   GFR calc non Af Amer 37 (*) >90 mL/min   GFR calc Af Amer 43 (*) >90 mL/min   Comment: (NOTE)     The  eGFR has been calculated using the CKD EPI equation.     This calculation has not been validated in all clinical situations.     eGFR's persistently <90 mL/min signify possible Chronic Kidney     Disease.   Anion gap 11  5 - 15   No results found.  Review of Systems  Respiratory: Negative for shortness of breath.   Cardiovascular: Negative for chest pain.    Blood pressure 130/70, pulse 116, temperature 97.6 F (36.4 C), temperature source Oral, resp. rate 30, height _0  (1.727 m), weight 167 lb 12.3 oz (76.1 kg), SpO2 94.00%. Physical Exam  Cardiovascular: Normal rate, regular rhythm and normal heart sounds.   Respiratory: Breath sounds normal.     Assessment/Plan For tunneled CVC placement today.  Patient seen and consented via Spanish interpretor.  Shatasia Cutshaw T 03/24/2014, 1:57 PM

## 2014-03-24 NOTE — Progress Notes (Signed)
[facility] PROGRESS NOTE Interim History: MR. Mccauley is a 80 y.o. male w/ PMHx significant for NICM with EF of 20% on chronic milrinone, HTN, pulm HTN, chronic renal insufficiency (baseline CR 1.8), retroperitoneal mass and PAF.Presented to the ED 9/27 with fevers 103 and erythema around PICC. Concern for sepsis.PICC discontinued 9/27 d/t blood cultures Bcx 2/2 with MSSA. CXR showed possible HCAP, started on vanc and cefepime, then de-escalated to cefepime TTE negative ID recommended to cont Cefepime for 2 weeks. New picc line on 10.5.2015.  Filed Weights   03/22/14 0300 03/23/14 0500 03/24/14 0300  Weight: 77.5 kg (170 lb 13.7 oz) 77.3 kg (170 lb 6.7 oz) 76.114 kg (167 lb 12.8 oz)        Intake/Output Summary (Last 24 hours) at 03/24/14 0750 Last data filed at 03/24/14 0600  Gross per 24 hour  Intake    248 ml  Output   1650 ml  Net  -1402 ml     Assessment/Plan: Severe sepsis due to MSSA bacteremia and HCAP: - Multifactorial secondary to MSSA bacteremia (due to PICC line) and probable HCAP. - Insert PICC line on 10.5.2015 - Sensitive to cefepime MSSA - ID consulted. TTE does not show abnormality  - cont cefepime for 2 weeks, last dose of cefepime 10.12.2015 and repeat blood cultures in 1 week after completing treatment. - Long discussion with pt and Cardiologist he desire to continue treatment with Milrinone and bacteremia, is not ready to move toward comfort care despite poor prognosis.  Acute respiratory failure with hypoxia due to acute on chronic systolic heart failure - Secondary to acute on chronic CHF exacerbation and  HCAP.  - Patient currently on milirone and torsemide. - On Torsemide and metolazone per heart failure team. Worsening renal function. - Advance heart failure team on board.  Nausea and emesis  - due to acute decompensated HF and sepsis, now resolved - Cardiac enzymes negative.  Acute on chronic kidney disease stage III  - Elevated creatinine  was likely secondary  Heart failure. - Renal function has worsen.  Paroxysmal atrial fibrillation  - Currently rate controlled on amiodarone. INR is therapeutic cont per pharmacy.  pulmonary hypertension  Continue slidenafil.   Severe protein caloric malnutrition: - cont ensure.  Diarrhea  C. difficile PCR pending. WBC trending down.  Code Status: full Family Communication: wife  Disposition Plan: inpatient   Consultants:  Cardiology  ID  Procedures: ECHO:does not show abnormality    Antibiotics:  Vanc 9.28.2015>>>9.30.2015  cefepime 9.28.2015>>  HPI/Subjective: His breathing cont to improve. Has not drank water. No abdominal pain.  Objective: Filed Vitals:   03/23/14 1656 03/23/14 1929 03/23/14 2300 03/24/14 0300  BP: 94/45 100/49 94/38 97/55   Pulse: 91 81 81 86  Temp: 97.5 F (36.4 C) 97.5 F (36.4 C) 97.3 F (36.3 C) 97.4 F (36.3 C)  TempSrc: Oral Oral Oral Oral  Resp: 30 27 23 19   Height:      Weight:    76.114 kg (167 lb 12.8 oz)  SpO2: 95% 99% 94% 96%     Exam:  General: Alert, awake, oriented x3, in no acute distress.  HEENT: +JVD Heart: Regular rate and rhythm.3+ edema Lungs: Good air movement, clear Abdomen: Soft, nontender, distended abdomen positive bowel sounds.     Data Reviewed: Basic Metabolic Panel:  Recent Labs Lab 03/19/14 0343 03/20/14 0330 03/21/14 0255 03/22/14 0253 03/23/14 0247 03/24/14 0230  NA 136* 138 137 140 136* 138  K 4.5 4.7 3.9 4.0 3.4*  4.1  CL 98 98 97 97 90* 91*  CO2 26 27 27  32 36* 36*  GLUCOSE 127* 146* 132* 131* 156* 180*  BUN 41* 41* 38* 37* 37* 42*  CREATININE 1.54* 1.53* 1.40* 1.37* 1.48* 1.67*  CALCIUM 8.5 8.4 8.1* 8.2* 8.7 9.1  MG 2.1  --   --   --   --   --    Liver Function Tests: No results found for this basename: AST, ALT, ALKPHOS, BILITOT, PROT, ALBUMIN,  in the last 168 hours No results found for this basename: LIPASE, AMYLASE,  in the last 168 hours No results found for this  basename: AMMONIA,  in the last 168 hours CBC:  Recent Labs Lab 03/19/14 0343 03/20/14 0330 03/22/14 0253 03/23/14 0247  WBC 14.0* 12.8* 14.2* 14.0*  NEUTROABS 11.8* 10.5*  --   --   HGB 10.2* 9.9* 10.4* 11.3*  HCT 31.7* 30.4* 31.6* 34.0*  MCV 97.5 94.1 95.8 92.9  PLT 251 266 336 399   Cardiac Enzymes: No results found for this basename: CKTOTAL, CKMB, CKMBINDEX, TROPONINI,  in the last 168 hours BNP (last 3 results)  Recent Labs  11/07/13 0708 03/01/14 1959 03/08/14 2230  PROBNP 9356.0* 11880.0* 15334.0*   CBG:  Recent Labs Lab 03/19/14 1726 03/19/14 2037 03/19/14 2351 03/20/14 0553 03/21/14 1703  GLUCAP 161* 153* 119* 121* 178*    Recent Results (from the past 240 hour(s))  CULTURE, BLOOD (ROUTINE X 2)     Status: None   Collection Time    03/16/14  4:15 PM      Result Value Ref Range Status   Specimen Description BLOOD BLOOD LEFT FOREARM   Final   Special Requests BOTTLES DRAWN AEROBIC AND ANAEROBIC 10CC EACH   Final   Culture  Setup Time     Final   Value: 03/17/2014 00:28     Performed at [facility]   Culture     Final   Value: STAPHYLOCOCCUS AUREUS     Note: RIFAMPIN AND GENTAMICIN SHOULD NOT BE USED AS SINGLE DRUGS FOR TREATMENT OF STAPH INFECTIONS.     Note: Gram Stain Report Called to,Read Back By and Verified With: J. Alvera Singh @ 10AM 9.28.15 DESIS Culture results may be compromised due to an excessive volume of blood received in culture bottles.     Performed at [facility]   Report Status 03/19/2014 FINAL   Final   Organism ID, Bacteria STAPHYLOCOCCUS AUREUS   Final  CULTURE, BLOOD (ROUTINE X 2)     Status: None   Collection Time    03/16/14  4:37 PM      Result Value Ref Range Status   Specimen Description BLOOD LEFT HAND   Final   Special Requests BOTTLES DRAWN AEROBIC AND ANAEROBIC [facility] EACH   Final   Culture  Setup Time     Final   Value: 03/17/2014 00:28     Performed at [facility]   Culture     Final   Value:  STAPHYLOCOCCUS AUREUS     Note: SUSCEPTIBILITIES PERFORMED ON PREVIOUS CULTURE WITHIN THE LAST 5 DAYS.     Note: Gram Stain Report Called to,Read Back By and Verified With: Valente David @ 10AM 9.28.15 DESIS     Performed at [facility]   Report Status 03/19/2014 FINAL   Final  URINE CULTURE     Status: None   Collection Time    03/16/14  4:59 PM  Result Value Ref Range Status   Specimen Description URINE, CLEAN CATCH   Final   Special Requests NONE   Final   Culture  Setup Time     Final   Value: 03/17/2014 01:31     Performed at [place]     Final   Value: NO GROWTH     Performed at [facility]   Culture     Final   Value: NO GROWTH     Performed at [facility]   Report Status 03/18/2014 FINAL   Final  MRSA PCR SCREENING     Status: None   Collection Time    03/17/14  6:22 AM      Result Value Ref Range Status   MRSA by PCR NEGATIVE  NEGATIVE Final   Comment:            The GeneXpert MRSA Assay (FDA     approved for NASAL specimens     only), is one component of a     comprehensive MRSA colonization     surveillance program. It is not     intended to diagnose MRSA     infection nor to guide or     monitor treatment for     MRSA infections.  CULTURE, BLOOD (ROUTINE X 2)     Status: None   Collection Time    03/18/14  4:50 PM      Result Value Ref Range Status   Specimen Description BLOOD RIGHT ARM   Final   Special Requests BOTTLES DRAWN AEROBIC ONLY 2CC   Final   Culture  Setup Time     Final   Value: 03/18/2014 22:28     Performed at [facility]   Culture     Final   Value:        BLOOD CULTURE RECEIVED NO GROWTH TO DATE CULTURE WILL BE HELD FOR 5 DAYS BEFORE ISSUING A FINAL NEGATIVE REPORT     Performed at [facility]   Report Status PENDING   Incomplete  CLOSTRIDIUM DIFFICILE BY PCR     Status: None   Collection Time    03/19/14  6:00 PM      Result Value Ref Range Status   C difficile by  pcr NEGATIVE  NEGATIVE Final  CLOSTRIDIUM DIFFICILE BY PCR     Status: None   Collection Time    03/22/14  2:29 PM      Result Value Ref Range Status   C difficile by pcr NEGATIVE  NEGATIVE Final     Studies: No results found.  Scheduled Meds: . acidophilus  2 capsule Oral Daily  . amiodarone  200 mg Oral BID  . atorvastatin  20 mg Oral q1800  . budesonide (PULMICORT) nebulizer solution  0.25 mg Nebulization BID  . ceFEPime (MAXIPIME) IV  1 g Intravenous Q24H  . feeding supplement (ENSURE COMPLETE)  237 mL Oral TID BM  . loratadine  10 mg Oral Daily  . magnesium oxide  400 mg Oral Daily  . potassium chloride SA  20 mEq Oral BID  . sildenafil  20 mg Oral TID  . sodium chloride  3 mL Intravenous Q12H  . torsemide  40 mg Oral BID  . Warfarin - Pharmacist Dosing Inpatient   Does not apply q1800   Continuous Infusions: . sodium chloride 2 mL/hr (03/20/14 0130)  . milrinone 0.375 mcg/kg/min (03/24/14 0007)     FELIZ  Marguarite Arbour  [facility] Pager [phone] If 8PM-8AM, please contact night-coverage at [email or web address], password [facility] 03/24/2014, 7:50 AM  LOS: 8 days     **Disclaimer: This note may have been dictated with voice recognition software. Similar sounding words can inadvertently be transcribed and this note may contain transcription errors which may not have been corrected upon publication of note.**

## 2014-03-24 NOTE — Sedation Documentation (Signed)
Changed oxygen to a 100% NRB for sat below 90%.

## 2014-03-24 NOTE — Progress Notes (Signed)
PT Cancellation Note  Patient Details Name: Tyler Christensen MRN: [id number] DOB: [birth year]   Cancelled Treatment:    Reason Eval/Treat Not Completed: Patient at procedure or test/unavailable. Pt off floor getting PICC line. Will follow.    Blondell Reveal Kistler 03/24/2014, 1:45 PM [phone]

## 2014-03-25 DIAGNOSIS — A419 Sepsis, unspecified organism: Secondary | ICD-10-CM

## 2014-03-25 DIAGNOSIS — I4891 Unspecified atrial fibrillation: Secondary | ICD-10-CM

## 2014-03-25 DIAGNOSIS — I5022 Chronic systolic (congestive) heart failure: Secondary | ICD-10-CM

## 2014-03-25 DIAGNOSIS — I10 Essential (primary) hypertension: Secondary | ICD-10-CM

## 2014-03-25 LAB — BASIC METABOLIC PANEL
Anion gap: 13 (ref 5–15)
BUN: 51 mg/dL — AB (ref 6–23)
CO2: 37 mEq/L — ABNORMAL HIGH (ref 19–32)
CREATININE: 1.99 mg/dL — AB (ref 0.50–1.35)
Calcium: 8.8 mg/dL (ref 8.4–10.5)
Chloride: 92 mEq/L — ABNORMAL LOW (ref 96–112)
GFR calc Af Amer: 35 mL/min — ABNORMAL LOW (ref >90)
GFR calc non Af Amer: 30 mL/min — ABNORMAL LOW (ref >90)
Glucose, Bld: 172 mg/dL — ABNORMAL HIGH (ref 70–99)
Potassium: 3.5 mEq/L — ABNORMAL LOW (ref 3.7–5.3)
Sodium: 142 mEq/L (ref 137–147)

## 2014-03-25 LAB — PROTIME-INR
INR: 2.9 — AB (ref 0.00–1.49)
PROTHROMBIN TIME: 30.3 s — AB (ref 11.6–15.2)

## 2014-03-25 MED ORDER — WARFARIN SODIUM 2 MG PO TABS
2.0000 mg | ORAL_TABLET | Freq: Once | ORAL | Status: AC
  Administered 2014-03-25: 2 mg via ORAL
  Filled 2014-03-25: qty 1

## 2014-03-25 MED ORDER — FUROSEMIDE 10 MG/ML IJ SOLN
80.0000 mg | Freq: Two times a day (BID) | INTRAMUSCULAR | Status: DC
  Administered 2014-03-25 – 2014-03-29 (×8): 80 mg via INTRAVENOUS
  Filled 2014-03-25 (×9): qty 8

## 2014-03-25 NOTE — Progress Notes (Signed)
Advanced Heart Failure Rounding Note   Subjective:    [name] is a 80 y.o. male w/ PMHx significant for NICM with EF of 20% on chronic milrinone, HTN, pulm HTN, chronic renal insufficiency (baseline CR 1.8), retroperitoneal mass and PAF.  Just recently discharged 9/18 after AKI. Discharged weight was 160 lbs. Called last week about PICC line being out 7 cm and was scheduled for PICC exchange which he underwent (9/24).  Presented to the ED 9/27 with fevers 103 and erythema around PICC. Concern for sepsis and PICC exchanged. CT abdomen (9/28): increasing infiltration and atelectasis in the lung bases possible PNA. PICC discontinued 9/27. BCX MSSA x 2. Echo EF 20%. No obvious vegetation. Surveillance bcx remain negative.   Remains on milrinone 0.375 mcg. Yesterday he developed acute r respiratory distress after power picc place. Recived IV lasix, morphine, nitro, and xopnex. He improved back to his baseline.   Renal function elevated. Weight stable at 165 (which is up 2 pounds from baseline) Continues on cefipime for PNA and MSSA bacteremia.   Denies dyspnea/orthopnea.   Long discussion with him and his wife about prognosis and EOL issues. He wants to continue with aggressive care for now including inotropes. Not interested in SNF or Hospice.  Creatinine 1.4> 1.6>1.9  Objective:   Weight Range:  Vital Signs:   Temp:  [97.2 F (36.2 C)-97.6 F (36.4 C)] 97.2 F (36.2 C) (10/06 0509) Pulse Rate:  [82-116] 84 (10/06 0509) Resp:  [18-30] 18 (10/06 0509) BP: (95-137)/(41-81) 95/42 mmHg (10/06 0509) SpO2:  [87 %-100 %] 99 % (10/06 0509) Weight:  [165 lb 3.2 oz (74.934 kg)-167 lb 12.3 oz (76.1 kg)] 165 lb 3.2 oz (74.934 kg) (10/06 0509) Last BM Date: 03/23/14  Weight change: Filed Weights   03/24/14 0300 03/24/14 1031 03/25/14 0509  Weight: 167 lb 12.8 oz (76.114 kg) 167 lb 12.3 oz (76.1 kg) 165 lb 3.2 oz (74.934 kg)    Intake/Output:   Intake/Output Summary (Last 24 hours) at  03/25/14 0826 Last data filed at 03/25/14 0431  Gross per 24 hour  Intake 735.87 ml  Output   1300 ml  Net -564.13 ml     Physical Exam: General:  Elderly appearing. No resp difficulty, on 2L [place] Sitting in chair.  HEENT: normal Neck: supple. JVP 5-6 Carotids 2+ bilat; no bruits. No lymphadenopathy or thryomegaly appreciated.  Cor: PMI laterally displaced + S3 R upper chest PICC placed. Distant regular Lungs: diminished in the bases. Abdomen: tender, + distended. No hepatosplenomegaly. No bruits or masses. Good bowel sounds. Extremities: no cyanosis, clubbing, rash, trace  edema,  Neuro: alert & orientedx3, cranial nerves grossly intact. moves all 4 extremities w/o difficulty. Affect pleasant  Telemetry: V paced 90s  Labs: Basic Metabolic Panel:  Recent Labs Lab 03/19/14 0343  03/21/14 0255 03/22/14 0253 03/23/14 0247 03/24/14 0230 03/25/14 0535  NA 136*  < > 137 140 136* 138 142  K 4.5  < > 3.9 4.0 3.4* 4.1 3.5*  CL 98  < > 97 97 90* 91* 92*  CO2 26  < > 27 32 36* 36* 37*  GLUCOSE 127*  < > 132* 131* 156* 180* 172*  BUN 41*  < > 38* 37* 37* 42* 51*  CREATININE 1.54*  < > 1.40* 1.37* 1.48* 1.67* 1.99*  CALCIUM 8.5  < > 8.1* 8.2* 8.7 9.1 8.8  MG 2.1  --   --   --   --   --   --   < > =  values in this interval not displayed.  Liver Function Tests: No results found for this basename: AST, ALT, ALKPHOS, BILITOT, PROT, ALBUMIN,  in the last 168 hours No results found for this basename: LIPASE, AMYLASE,  in the last 168 hours No results found for this basename: AMMONIA,  in the last 168 hours  CBC:  Recent Labs Lab 03/19/14 0343 03/20/14 0330 03/22/14 0253 03/23/14 0247  WBC 14.0* 12.8* 14.2* 14.0*  NEUTROABS 11.8* 10.5*  --   --   HGB 10.2* 9.9* 10.4* 11.3*  HCT 31.7* 30.4* 31.6* 34.0*  MCV 97.5 94.1 95.8 92.9  PLT 251 266 336 399    Cardiac Enzymes: No results found for this basename: CKTOTAL, CKMB, CKMBINDEX, TROPONINI,  in the last 168 hours  BNP: BNP  (last 3 results)  Recent Labs  11/07/13 0708 03/01/14 1959 03/08/14 2230  PROBNP 9356.0* 11880.0* 15334.0*     Other results:  EKG:   Imaging: Ir Fluoro Guide Cv Line Right  03/24/2014   CLINICAL DATA:  Chronic heart failure and need for tunneled central venous catheter for chronic milrinone infusion therapy.  EXAM: TUNNELED CENTRAL VENOUS CATHETER PLACEMENT WITH ULTRASOUND AND FLUOROSCOPIC GUIDANCE  ANESTHESIA/SEDATION: 1.0 mg IV Versed; 50 mcg IV Fentanyl.  Total Moderate Sedation Time  35 minutes.  MEDICATIONS: No additional medications.  FLUOROSCOPY TIME:  12 seconds.  PROCEDURE: The procedure, risks, benefits, and alternatives were explained to the patient. Questions regarding the procedure were encouraged and answered. The patient understands and consents to the procedure.  The right neck and chest were prepped with chlorhexidine in a sterile fashion, and a sterile drape was applied covering the operative field. Maximum barrier sterile technique with sterile gowns and gloves were used for the procedure. Local anesthesia was provided with 1% lidocaine.  Ultrasound was used to confirm patency of the right internal jugular vein. After creating a small venotomy incision, a 21 gauge needle was advanced into the right internal jugular vein under direct, real-time ultrasound guidance. Ultrasound image documentation was performed. After securing guidewire access, an 6 French peel-away sheath was placed. A wire was kinked to measure appropriate catheter length.  A 6 Fr dual lumen Power Line tunneled catheter measuring 25 cm from tip to cuff was chosen for placement. This was tunneled in a retrograde fashion from the chest wall to the venotomy incision.  The catheter was then placed through the sheath and the sheath removed. Final catheter positioning was confirmed and documented with a fluoroscopic spot image. The catheter was aspirated and flushed with saline  The venotomy incision was closed with  subcuticular 4-0 Vicryl. Dermabond was applied to the incision. The catheter exit site was secured with 0-Prolene retention sutures.  COMPLICATIONS: None.  No pneumothorax.  FINDINGS: After catheter placement, the tip lies at the cavoatrial junction. The catheter aspirates normally and is ready for immediate use.  IMPRESSION: Placement of tunneled central venous catheter via the right internal jugular vein. The catheter tip lies at the cavoatrial junction. The catheter is ready for immediate use.   Electronically Signed   By: Aletta Edouard M.D.   On: 03/24/2014 16:56   Ir US Guide Vasc Access Right  03/24/2014   CLINICAL DATA:  Chronic heart failure and need for tunneled central venous catheter for chronic milrinone infusion therapy.  EXAM: TUNNELED CENTRAL VENOUS CATHETER PLACEMENT WITH ULTRASOUND AND FLUOROSCOPIC GUIDANCE  ANESTHESIA/SEDATION: 1.0 mg IV Versed; 50 mcg IV Fentanyl.  Total Moderate Sedation Time  35 minutes.  MEDICATIONS: No additional  medications.  FLUOROSCOPY TIME:  12 seconds.  PROCEDURE: The procedure, risks, benefits, and alternatives were explained to the patient. Questions regarding the procedure were encouraged and answered. The patient understands and consents to the procedure.  The right neck and chest were prepped with chlorhexidine in a sterile fashion, and a sterile drape was applied covering the operative field. Maximum barrier sterile technique with sterile gowns and gloves were used for the procedure. Local anesthesia was provided with 1% lidocaine.  Ultrasound was used to confirm patency of the right internal jugular vein. After creating a small venotomy incision, a 21 gauge needle was advanced into the right internal jugular vein under direct, real-time ultrasound guidance. Ultrasound image documentation was performed. After securing guidewire access, an 6 French peel-away sheath was placed. A wire was kinked to measure appropriate catheter length.  A 6 Fr dual lumen Power  Line tunneled catheter measuring 25 cm from tip to cuff was chosen for placement. This was tunneled in a retrograde fashion from the chest wall to the venotomy incision.  The catheter was then placed through the sheath and the sheath removed. Final catheter positioning was confirmed and documented with a fluoroscopic spot image. The catheter was aspirated and flushed with saline  The venotomy incision was closed with subcuticular 4-0 Vicryl. Dermabond was applied to the incision. The catheter exit site was secured with 0-Prolene retention sutures.  COMPLICATIONS: None.  No pneumothorax.  FINDINGS: After catheter placement, the tip lies at the cavoatrial junction. The catheter aspirates normally and is ready for immediate use.  IMPRESSION: Placement of tunneled central venous catheter via the right internal jugular vein. The catheter tip lies at the cavoatrial junction. The catheter is ready for immediate use.   Electronically Signed   By: Aletta Edouard M.D.   On: 03/24/2014 16:56   Dg Chest Port 1 View  03/24/2014   CLINICAL DATA:  Shortness of breath. History of hypertension and CHF. History of atrial fibrillation. Subsequent encounter.  EXAM: PORTABLE CHEST - 1 VIEW  COMPARISON:  03/16/2014.  FINDINGS: Right IJ sheath in cardiac pacer in stable position. Cardiomegaly with pulmonary vascular prominence diffuse bilateral pulmonary infiltrates noted consistent congestive heart failure with pulmonary edema. Similar findings noted on prior study. Small pleural effusions cannot be excluded. No pneumothorax.  IMPRESSION: 1. Right IJ sheath in stable position. 2. Congestive heart failure with pulmonary edema, no significant change from prior exam .   Electronically Signed   By: [place]   On: 03/24/2014 16:23     Medications:     Scheduled Medications: . acidophilus  2 capsule Oral Daily  . amiodarone  200 mg Oral BID  . atorvastatin  20 mg Oral q1800  . budesonide (PULMICORT) nebulizer solution   0.25 mg Nebulization BID  .  ceFAZolin (ANCEF) IV  2 g Intravenous Q12H  . feeding supplement (ENSURE COMPLETE)  237 mL Oral TID BM  . loratadine  10 mg Oral Daily  . magnesium oxide  400 mg Oral Daily  . potassium chloride SA  20 mEq Oral BID  . sildenafil  20 mg Oral TID  . sodium chloride  10-40 mL Intracatheter Q12H  . sodium chloride  3 mL Intravenous Q12H  . torsemide  40 mg Oral BID  . Warfarin - Pharmacist Dosing Inpatient   Does not apply q1800    Infusions: . sodium chloride 2 mL/hr (03/20/14 0130)  . 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 is well known to the HF team and was just admitted for AKI and abdominal distention. He went home on 9/18 and called the HF clinic last week d/t PICC being out 7 cm. Underwent exchange 9/24 and subsequently presented to the ED on 9/27 with fevers and erythema around PICC line. Bcx 2/2 MSSA. He also has PNA noted on CT.   Bcx 2/2 for MSSA. ID has seen. Infectious symptoms improving.  Place power picc in RIJ line today  IR Continue abx per ID (thanks).. No fever. Will need to watch closely.   Volume status stable.  Continue Torsemide  40 mg twice a day. Renal function up but given IV lasix yesterday.   INR 2.0 Pharmacy adjusting (appreciate their assistance)  Anticipate d/c tomorrow. [facility] to resume care for home milrinone.   Will discuss case with Rosanne Sack to see if she can help  with Palliative Services.   CLEGG,[name]  NP-C  03/25/2014   Patient seen and examined with Darrick Grinder, NP. We discussed all aspects of the encounter. I agree with the assessment and plan as stated above.   He is improving but still with volume on board. Continue diuresis.   Daniel Bensimhon,MD 1:55 PM

## 2014-03-25 NOTE — Progress Notes (Signed)
NUTRITION FOLLOW-UP  INTERVENTION: Continue Ensure Complete po TID, each supplement provides 350 kcal and 13 grams of protein  NUTRITION DIAGNOSIS: Inadequate oral intake related to heart failure as evidenced by reported intake less than estimated needs; ongoing.   Goal: Pt to meet >/= 90% of their estimated nutrition needs; not met.    Monitor:  Weight trend, po intake, acceptance of supplements, labs  ASSESSMENT: 80 y.o. 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   Spoke with pt via interpreter. Per pt he is eating fine. He is eating soups mostly. Explained that pt needs more nutrition. Pt reports consuming his ensures.  Per MD note anticipate d/c home tomorrow, pt not interested in SNF or hospice.   Labs: Potassium low BUN and Creatinine are elevated   Height: Ht Readings from Last 1 Encounters:  03/17/14 $RemoveB'5\' 8"'[id number]$  (1.727 m)    Weight: Wt Readings from Last 1 Encounters:  03/25/14 165 lb 3.2 oz (74.934 kg)    BMI:  Body mass index is 25.12 kg/(m^2).  Estimated Nutritional Needs: Kcal: 1850-2100 Protein: 90-100 g Fluid: 1.9-2.1 L/day  Skin: intact  Diet Order: Sodium Restricted Meal Completion: </= 25%   Intake/Output Summary (Last 24 hours) at 03/25/14 0912 Last data filed at 03/25/14 0844  Gross per 24 hour  Intake 845.87 ml  Output   1150 ml  Net -304.13 ml    Last BM: 10/6  Labs:   Recent Labs Lab 03/19/14 0343  03/23/14 0247 03/24/14 0230 03/25/14 0535  NA 136*  < > 136* 138 142  K 4.5  < > 3.4* 4.1 3.5*  CL 98  < > 90* 91* 92*  CO2 26  < > 36* 36* 37*  BUN 41*  < > 37* 42* 51*  CREATININE 1.54*  < > 1.48* 1.67* 1.99*  CALCIUM 8.5  < > 8.7 9.1 8.8  MG 2.1  --   --   --   --   GLUCOSE 127*  < > 156* 180* 172*  < > = values in this  interval not displayed.  CBG (last 3)  No results found for this basename: GLUCAP,  in the last 72 hours  Scheduled Meds: . acidophilus  2 capsule Oral Daily  . amiodarone  200 mg Oral BID  . atorvastatin  20 mg Oral q1800  . budesonide (PULMICORT) nebulizer solution  0.25 mg Nebulization BID  .  ceFAZolin (ANCEF) IV  2 g Intravenous Q12H  . feeding supplement (ENSURE COMPLETE)  237 mL Oral TID BM  . loratadine  10 mg Oral Daily  . magnesium oxide  400 mg Oral Daily  . potassium chloride SA  20 mEq Oral BID  . sildenafil  20 mg Oral TID  . sodium chloride  10-40 mL Intracatheter Q12H  . sodium chloride  3 mL Intravenous Q12H  . torsemide  40 mg Oral BID  . Warfarin - Pharmacist Dosing Inpatient   Does not apply q1800    Continuous Infusions: . sodium chloride 2 mL/hr (03/20/14 0130)  . milrinone 0.375 mcg/kg/min (03/25/14 0852)    [place], [place], [place] Pager [phone] After Hours Pager

## 2014-03-25 NOTE — Progress Notes (Signed)
[facility] PROGRESS NOTE Interim History: MR. Peters is a 80 y.o. male w/ PMHx significant for NICM with EF of 20% on chronic milrinone, HTN, pulm HTN, chronic renal insufficiency (baseline CR 1.8), retroperitoneal mass and PAF.Presented to the ED 9/27 with fevers 103 and erythema around PICC. Concern for sepsis.PICC discontinued 9/27 d/t blood cultures Bcx 2/2 with MSSA. CXR showed possible HCAP, started on vanc and cefepime, then de-escalated to cefepime TTE negative ID recommended to cont Cefepime for 2 weeks. New picc line on 10.5.2015. He completed his HCAP treatment will de-escalte to ancef.  Filed Weights   03/24/14 0300 03/24/14 1031 03/25/14 0509  Weight: 76.114 kg (167 lb 12.8 oz) 76.1 kg (167 lb 12.3 oz) 74.934 kg (165 lb 3.2 oz)        Intake/Output Summary (Last 24 hours) at 03/25/14 9518 Last data filed at 03/25/14 0431  Gross per 24 hour  Intake 735.87 ml  Output   1300 ml  Net -564.13 ml     Assessment/Plan: Severe sepsis due to MSSA bacteremia and HCAP: - Multifactorial secondary to MSSA bacteremia (due to PICC line) and probable HCAP. - Insert PICC line on 10.5.2015 - Sensitive to cefepime MSSA, he has completed his HCAP antibiotic regimen will de-escalte to ancef. - ID consulted. TTE does not show abnormality  - cont Ancef 10.12.2015 and repeat blood cultures in 1 week after completing treatment.  Acute respiratory failure with hypoxia due to acute on chronic systolic heart failure: - had an episode of respiratory distress after central line on 10.6.2015. - Secondary to acute on chronic CHF exacerbation and  HCAP.  - Patient currently on milirone and torsemide. - On Torsemide and metolazone per heart failure team. Worsening renal function. - Advance heart failure team on board.  Nausea and emesis  - due to acute decompensated HF and sepsis, now resolved  Acute on chronic kidney disease stage III  - Elevated creatinine was likely secondary  Heart  failure. - Renal function has worsen. Received extra dose of lasix due to respiratory distress.  Paroxysmal atrial fibrillation  - Currently rate controlled on amiodarone. INR is therapeutic cont per pharmacy.  pulmonary hypertension  Continue slidenafil.   Severe protein caloric malnutrition: - cont ensure.  Diarrhea  - C. difficile PCR negative. - WBC trending down.  Code Status: full Family Communication: wife  Disposition Plan: inpatient   Consultants:  Cardiology  ID  Procedures: ECHO:does not show abnormality    Antibiotics:  Vanc 9.28.2015>>>9.30.2015  cefepime 9.28.2015>>  HPI/Subjective: His breathing cont to improve. Has not drank water. No abdominal pain.  Objective: Filed Vitals:   03/24/14 2001 03/24/14 2046 03/25/14 0255 03/25/14 0509  BP: 108/69  98/45 95/42  Pulse: 94  99 84  Temp: 97.4 F (36.3 C)   97.2 F (36.2 C)  TempSrc: Oral   Oral  Resp: 18  18 18   Height:      Weight:    74.934 kg (165 lb 3.2 oz)  SpO2: 98% 98% 99% 99%     Exam:  General: Alert, awake, oriented x3, in no acute distress.  HEENT: +JVD Heart: Regular rate and rhythm.3+ edema Lungs: Good air movement, clear Abdomen: Soft, nontender, distended abdomen positive bowel sounds.     Data Reviewed: Basic Metabolic Panel:  Recent Labs Lab 03/19/14 0343  03/21/14 0255 03/22/14 0253 03/23/14 0247 03/24/14 0230 03/25/14 0535  NA 136*  < > 137 140 136* 138 142  K 4.5  < > 3.9 4.0  3.4* 4.1 3.5*  CL 98  < > 97 97 90* 91* 92*  CO2 26  < > 27 32 36* 36* 37*  GLUCOSE 127*  < > 132* 131* 156* 180* 172*  BUN 41*  < > 38* 37* 37* 42* 51*  CREATININE 1.54*  < > 1.40* 1.37* 1.48* 1.67* 1.99*  CALCIUM 8.5  < > 8.1* 8.2* 8.7 9.1 8.8  MG 2.1  --   --   --   --   --   --   < > = values in this interval not displayed. Liver Function Tests: No results found for this basename: AST, ALT, ALKPHOS, BILITOT, PROT, ALBUMIN,  in the last 168 hours No results found for this  basename: LIPASE, AMYLASE,  in the last 168 hours No results found for this basename: AMMONIA,  in the last 168 hours CBC:  Recent Labs Lab 03/19/14 0343 03/20/14 0330 03/22/14 0253 03/23/14 0247  WBC 14.0* 12.8* 14.2* 14.0*  NEUTROABS 11.8* 10.5*  --   --   HGB 10.2* 9.9* 10.4* 11.3*  HCT 31.7* 30.4* 31.6* 34.0*  MCV 97.5 94.1 95.8 92.9  PLT 251 266 336 399   Cardiac Enzymes: No results found for this basename: CKTOTAL, CKMB, CKMBINDEX, TROPONINI,  in the last 168 hours BNP (last 3 results)  Recent Labs  11/07/13 0708 03/01/14 1959 03/08/14 2230  PROBNP 9356.0* 11880.0* 15334.0*   CBG:  Recent Labs Lab 03/19/14 1726 03/19/14 2037 03/19/14 2351 03/20/14 0553 03/21/14 1703  GLUCAP 161* 153* 119* 121* 178*    Recent Results (from the past 240 hour(s))  CULTURE, BLOOD (ROUTINE X 2)     Status: None   Collection Time    03/16/14  4:15 PM      Result Value Ref Range Status   Specimen Description BLOOD BLOOD LEFT FOREARM   Final   Special Requests BOTTLES DRAWN AEROBIC AND ANAEROBIC 10CC EACH   Final   Culture  Setup Time     Final   Value: 03/17/2014 00:28     Performed at [facility]   Culture     Final   Value: STAPHYLOCOCCUS AUREUS     Note: RIFAMPIN AND GENTAMICIN SHOULD NOT BE USED AS SINGLE DRUGS FOR TREATMENT OF STAPH INFECTIONS.     Note: Gram Stain Report Called to,Read Back By and Verified With: J. Alvera Singh @ 10AM 9.28.15 DESIS Culture results may be compromised due to an excessive volume of blood received in culture bottles.     Performed at [facility]   Report Status 03/19/2014 FINAL   Final   Organism ID, Bacteria STAPHYLOCOCCUS AUREUS   Final  CULTURE, BLOOD (ROUTINE X 2)     Status: None   Collection Time    03/16/14  4:37 PM      Result Value Ref Range Status   Specimen Description BLOOD LEFT HAND   Final   Special Requests BOTTLES DRAWN AEROBIC AND ANAEROBIC [facility] EACH   Final   Culture  Setup Time     Final   Value:  03/17/2014 00:28     Performed at [facility]   Culture     Final   Value: STAPHYLOCOCCUS AUREUS     Note: SUSCEPTIBILITIES PERFORMED ON PREVIOUS CULTURE WITHIN THE LAST 5 DAYS.     Note: Gram Stain Report Called to,Read Back By and Verified With: Valente David @ 10AM 9.28.15 DESIS     Performed at [place]  Status 03/19/2014 FINAL   Final  URINE CULTURE     Status: None   Collection Time    03/16/14  4:59 PM      Result Value Ref Range Status   Specimen Description URINE, CLEAN CATCH   Final   Special Requests NONE   Final   Culture  Setup Time     Final   Value: 03/17/2014 01:31     Performed at [place]     Final   Value: NO GROWTH     Performed at [facility]   Culture     Final   Value: NO GROWTH     Performed at [facility]   Report Status 03/18/2014 FINAL   Final  MRSA PCR SCREENING     Status: None   Collection Time    03/17/14  6:22 AM      Result Value Ref Range Status   MRSA by PCR NEGATIVE  NEGATIVE Final   Comment:            The GeneXpert MRSA Assay (FDA     approved for NASAL specimens     only), is one component of a     comprehensive MRSA colonization     surveillance program. It is not     intended to diagnose MRSA     infection nor to guide or     monitor treatment for     MRSA infections.  CULTURE, BLOOD (ROUTINE X 2)     Status: None   Collection Time    03/18/14  4:50 PM      Result Value Ref Range Status   Specimen Description BLOOD RIGHT ARM   Final   Special Requests BOTTLES DRAWN AEROBIC ONLY 2CC   Final   Culture  Setup Time     Final   Value: 03/18/2014 22:28     Performed at [facility]   Culture     Final   Value: NO GROWTH 5 DAYS     Performed at [facility]   Report Status 03/24/2014 FINAL   Final  CLOSTRIDIUM DIFFICILE BY PCR     Status: None   Collection Time    03/19/14  6:00 PM      Result Value Ref Range Status   C difficile by pcr  NEGATIVE  NEGATIVE Final  CLOSTRIDIUM DIFFICILE BY PCR     Status: None   Collection Time    03/22/14  2:29 PM      Result Value Ref Range Status   C difficile by pcr NEGATIVE  NEGATIVE Final     Studies: Ir Fluoro Guide Cv Line Right  03/24/2014   CLINICAL DATA:  Chronic heart failure and need for tunneled central venous catheter for chronic milrinone infusion therapy.  EXAM: TUNNELED CENTRAL VENOUS CATHETER PLACEMENT WITH ULTRASOUND AND FLUOROSCOPIC GUIDANCE  ANESTHESIA/SEDATION: 1.0 mg IV Versed; 50 mcg IV Fentanyl.  Total Moderate Sedation Time  35 minutes.  MEDICATIONS: No additional medications.  FLUOROSCOPY TIME:  12 seconds.  PROCEDURE: The procedure, risks, benefits, and alternatives were explained to the patient. Questions regarding the procedure were encouraged and answered. The patient understands and consents to the procedure.  The right neck and chest were prepped with chlorhexidine in a sterile fashion, and a sterile drape was applied covering the operative field. Maximum barrier sterile technique with sterile gowns and gloves were used for the procedure. Local anesthesia was provided with  1% lidocaine.  Ultrasound was used to confirm patency of the right internal jugular vein. After creating a small venotomy incision, a 21 gauge needle was advanced into the right internal jugular vein under direct, real-time ultrasound guidance. Ultrasound image documentation was performed. After securing guidewire access, an 6 French peel-away sheath was placed. A wire was kinked to measure appropriate catheter length.  A 6 Fr dual lumen Power Line tunneled catheter measuring 25 cm from tip to cuff was chosen for placement. This was tunneled in a retrograde fashion from the chest wall to the venotomy incision.  The catheter was then placed through the sheath and the sheath removed. Final catheter positioning was confirmed and documented with a fluoroscopic spot image. The catheter was aspirated and flushed  with saline  The venotomy incision was closed with subcuticular 4-0 Vicryl. Dermabond was applied to the incision. The catheter exit site was secured with 0-Prolene retention sutures.  COMPLICATIONS: None.  No pneumothorax.  FINDINGS: After catheter placement, the tip lies at the cavoatrial junction. The catheter aspirates normally and is ready for immediate use.  IMPRESSION: Placement of tunneled central venous catheter via the right internal jugular vein. The catheter tip lies at the cavoatrial junction. The catheter is ready for immediate use.   Electronically Signed   By: Aletta Edouard M.D.   On: 03/24/2014 16:56   Ir US Guide Vasc Access Right  03/24/2014   CLINICAL DATA:  Chronic heart failure and need for tunneled central venous catheter for chronic milrinone infusion therapy.  EXAM: TUNNELED CENTRAL VENOUS CATHETER PLACEMENT WITH ULTRASOUND AND FLUOROSCOPIC GUIDANCE  ANESTHESIA/SEDATION: 1.0 mg IV Versed; 50 mcg IV Fentanyl.  Total Moderate Sedation Time  35 minutes.  MEDICATIONS: No additional medications.  FLUOROSCOPY TIME:  12 seconds.  PROCEDURE: The procedure, risks, benefits, and alternatives were explained to the patient. Questions regarding the procedure were encouraged and answered. The patient understands and consents to the procedure.  The right neck and chest were prepped with chlorhexidine in a sterile fashion, and a sterile drape was applied covering the operative field. Maximum barrier sterile technique with sterile gowns and gloves were used for the procedure. Local anesthesia was provided with 1% lidocaine.  Ultrasound was used to confirm patency of the right internal jugular vein. After creating a small venotomy incision, a 21 gauge needle was advanced into the right internal jugular vein under direct, real-time ultrasound guidance. Ultrasound image documentation was performed. After securing guidewire access, an 6 French peel-away sheath was placed. A wire was kinked to measure  appropriate catheter length.  A 6 Fr dual lumen Power Line tunneled catheter measuring 25 cm from tip to cuff was chosen for placement. This was tunneled in a retrograde fashion from the chest wall to the venotomy incision.  The catheter was then placed through the sheath and the sheath removed. Final catheter positioning was confirmed and documented with a fluoroscopic spot image. The catheter was aspirated and flushed with saline  The venotomy incision was closed with subcuticular 4-0 Vicryl. Dermabond was applied to the incision. The catheter exit site was secured with 0-Prolene retention sutures.  COMPLICATIONS: None.  No pneumothorax.  FINDINGS: After catheter placement, the tip lies at the cavoatrial junction. The catheter aspirates normally and is ready for immediate use.  IMPRESSION: Placement of tunneled central venous catheter via the right internal jugular vein. The catheter tip lies at the cavoatrial junction. The catheter is ready for immediate use.   Electronically Signed   By: Eulas Post  Kathlene Cote M.D.   On: 03/24/2014 16:56   Dg Chest Port 1 View  03/24/2014   CLINICAL DATA:  Shortness of breath. History of hypertension and CHF. History of atrial fibrillation. Subsequent encounter.  EXAM: PORTABLE CHEST - 1 VIEW  COMPARISON:  03/16/2014.  FINDINGS: Right IJ sheath in cardiac pacer in stable position. Cardiomegaly with pulmonary vascular prominence diffuse bilateral pulmonary infiltrates noted consistent congestive heart failure with pulmonary edema. Similar findings noted on prior study. Small pleural effusions cannot be excluded. No pneumothorax.  IMPRESSION: 1. Right IJ sheath in stable position. 2. Congestive heart failure with pulmonary edema, no significant change from prior exam .   Electronically Signed   By: [place]   On: 03/24/2014 16:23    Scheduled Meds: . acidophilus  2 capsule Oral Daily  . amiodarone  200 mg Oral BID  . atorvastatin  20 mg Oral q1800  . budesonide  (PULMICORT) nebulizer solution  0.25 mg Nebulization BID  .  ceFAZolin (ANCEF) IV  2 g Intravenous Q12H  . feeding supplement (ENSURE COMPLETE)  237 mL Oral TID BM  . loratadine  10 mg Oral Daily  . magnesium oxide  400 mg Oral Daily  . potassium chloride SA  20 mEq Oral BID  . sildenafil  20 mg Oral TID  . sodium chloride  10-40 mL Intracatheter Q12H  . sodium chloride  3 mL Intravenous Q12H  . torsemide  40 mg Oral BID  . Warfarin - Pharmacist Dosing Inpatient   Does not apply q1800   Continuous Infusions: . sodium chloride 2 mL/hr (03/20/14 0130)  . milrinone 0.375 mcg/kg/min (03/24/14 2008)     Charlynne Cousins  [facility] Pager [phone] If 8PM-8AM, please contact night-coverage at [email or web address], password [facility] 03/25/2014, 8:23 AM  LOS: 9 days

## 2014-03-25 NOTE — Progress Notes (Signed)
Physical Therapy Treatment Patient Details Name: Tyler Christensen MRN: [id number] DOB: 1932-09-26 Today's Date: 03/25/2014    History of Present Illness Pt is a 80 y.o. 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 Patient states that he's unable to take anything because of N/V. Patient states his abdominal discomfort also has increased from prior with increasing abdominal distention. In the ER patient was found to be febrile with temperatures around 103F and was initially mildly hypotensive. Chest x-ray shows congestive pattern. Patient on exam has erythema around his right-sided upper extremity PICC line. PICC line was placed in September and patient states that since, it is erythema.    PT Comments    Interpreter utilized throughout session. Pt progressing towards physical therapy goals. Pt able to show increased independence and safety with transfers, and improved ambulation distance to 150 feet. Continue to feel that pt is appropriate for d/c home with supervision. Pt states that his son is taking 15 weeks off of work to help care for him at home. Will continue to follow and progress as able per POC.   Follow Up Recommendations  Home health PT;Supervision for mobility/OOB     Equipment Recommendations  Rolling walker with 5" wheels    Recommendations for Other Services       Precautions / Restrictions Precautions Precautions: Fall Precaution Comments: Requires interpreter  Restrictions Weight Bearing Restrictions: No    Mobility  Bed Mobility               General bed mobility comments: Pt sitting in recliner upon PT arrival  Transfers Overall transfer level: Needs assistance Equipment used: Rolling walker (2 wheeled) Transfers: Sit to/from Stand;Stand Pivot Transfers Sit to  Stand: Supervision Stand pivot transfers: Min guard       General transfer comment: VC's for hand placement on seated surface for safety. Is able to perform SPT to [facility] without AD.  Ambulation/Gait Ambulation/Gait assistance: Min guard Ambulation Distance (Feet): 150 Feet Assistive device: Rolling walker (2 wheeled) Gait Pattern/deviations: Step-through pattern;Decreased stride length;Trunk flexed;Narrow base of support Gait velocity: Decreased Gait velocity interpretation: Below normal speed for age/gender General Gait Details: No physical assist required. Pt was cued for walker positioning and general safety awareness. Pt reports no SOB or pain during ambulation.    Stairs            Wheelchair Mobility    Modified Rankin (Stroke Patients Only)       Balance Overall balance assessment: Needs assistance Sitting-balance support: Feet supported;No upper extremity supported Sitting balance-Leahy Scale: Good     Standing balance support: No upper extremity supported;During functional activity Standing balance-Leahy Scale: Fair Standing balance comment: Pt able to perform peri-care independently without UE support.                    Cognition Arousal/Alertness: Awake/alert Behavior During Therapy: WFL for tasks assessed/performed (easily irritated but not agitated) Overall Cognitive Status: Within Functional Limits for tasks assessed                      Exercises      General Comments        Pertinent Vitals/Pain Pain Assessment: No/denies pain    Home Living  Prior Function            PT Goals (current goals can now be found in the care plan section) Acute Rehab PT Goals Patient Stated Goal: To return home with his family.  PT Goal Formulation: With patient Time For Goal Achievement: 03/28/14 Potential to Achieve Goals: Good Progress towards PT goals: Progressing toward goals    Frequency  Min  3X/week    PT Plan Current plan remains appropriate    Co-evaluation             End of Session Equipment Utilized During Treatment: Gait belt;Oxygen Activity Tolerance: Patient limited by fatigue Patient left: in chair;with call bell/phone within reach     Time: 1520-1608 PT Time Calculation (min): 48 min  Charges:  $Gait Training: 8-22 mins $Therapeutic Activity: 23-37 mins                    G Codes:      Rolinda Roan 2014/03/29, 4:29 PM  Rolinda Roan, PT, DPT Acute Rehabilitation Services Pager: [phone]

## 2014-03-25 NOTE — Progress Notes (Signed)
ANTICOAGULATION CONSULT NOTE - Follow Up Consult  Pharmacy Consult for Coumadin  Indication: atrial fibrillation   Allergies  Allergen Reactions  . Lisinopril Rash    Patient Measurements: Height: 5\' 8"  (172.7 cm) Weight: 165 lb 3.2 oz (74.934 kg) (scale c) IBW/kg (Calculated) : 68.4  Vital Signs: Temp: 98.4 F (36.9 C) (10/06 0846) Temp Source: Oral (10/06 0846) BP: 90/48 mmHg (10/06 0846) Pulse Rate: 84 (10/06 0846)  Labs:  Recent Labs  03/23/14 0247 03/24/14 0230 03/25/14 0535  HGB 11.3*  --   --   HCT 34.0*  --   --   PLT 399  --   --   LABPROT 28.9* 26.0* 30.3*  INR 2.73* 2.38* 2.90*  CREATININE 1.48* 1.67* 1.99*    Estimated Creatinine Clearance: 29.1 ml/min (by C-G formula based on Cr of 1.99).  Assessment: 81yom continues on coumadin for afib - on amio. INR was elevated on admit and he received vitamin K 2.5mg  x 1. Coumadin resumed,  INR 2.73>>2.38>>2.9. Patient sensitive to even small doses of warfarin . Hgb stable, no s/sx bleeding.  Home dose: 4mg  daily except 2mg  Friday and Sunday - will need lower dose at d/c.  Goal of Therapy:  INR 2-3 Monitor platelets by anticoagulation protocol: Yes   Plan:  1) Coumadin 2 mg x1 2) Daily INR, monitor Hgb  Megan E. Supple, Pharm.D Clinical Pharmacy Resident Pager: [phone] 03/25/2014 11:10 AM

## 2014-03-26 ENCOUNTER — Encounter: Payer: Self-pay | Admitting: Internal Medicine

## 2014-03-26 LAB — BASIC METABOLIC PANEL
Anion gap: 10 (ref 5–15)
BUN: 49 mg/dL — AB (ref 6–23)
CALCIUM: 8.8 mg/dL (ref 8.4–10.5)
CO2: 39 meq/L — AB (ref 19–32)
Chloride: 92 mEq/L — ABNORMAL LOW (ref 96–112)
Creatinine, Ser: 1.93 mg/dL — ABNORMAL HIGH (ref 0.50–1.35)
GFR calc Af Amer: 36 mL/min — ABNORMAL LOW (ref >90)
GFR calc non Af Amer: 31 mL/min — ABNORMAL LOW (ref >90)
GLUCOSE: 127 mg/dL — AB (ref 70–99)
Potassium: 3.5 mEq/L — ABNORMAL LOW (ref 3.7–5.3)
Sodium: 141 mEq/L (ref 137–147)

## 2014-03-26 LAB — PROTIME-INR
INR: 2.8 — ABNORMAL HIGH (ref 0.00–1.49)
Prothrombin Time: 29.5 seconds — ABNORMAL HIGH (ref 11.6–15.2)

## 2014-03-26 MED ORDER — WARFARIN SODIUM 2 MG PO TABS
2.0000 mg | ORAL_TABLET | Freq: Once | ORAL | Status: AC
  Administered 2014-03-26: 2 mg via ORAL
  Filled 2014-03-26: qty 1

## 2014-03-26 MED ORDER — POTASSIUM CHLORIDE CRYS ER 20 MEQ PO TBCR
40.0000 meq | EXTENDED_RELEASE_TABLET | Freq: Two times a day (BID) | ORAL | Status: DC
  Administered 2014-03-26 – 2014-04-05 (×21): 40 meq via ORAL
  Filled 2014-03-26 (×24): qty 2

## 2014-03-26 MED ORDER — METOLAZONE 2.5 MG PO TABS
2.5000 mg | ORAL_TABLET | Freq: Once | ORAL | Status: AC
  Administered 2014-03-26: 2.5 mg via ORAL
  Filled 2014-03-26: qty 1

## 2014-03-26 NOTE — Progress Notes (Signed)
Advanced Heart Failure Rounding Note   Subjective:    [name] is a 80 y.o. male w/ PMHx significant for NICM with EF of 20% on chronic milrinone, HTN, pulm HTN, chronic renal insufficiency (baseline CR 1.8), retroperitoneal mass and PAF.  Just recently discharged 9/18 after AKI. Discharged weight was 160 lbs. Called last week about PICC line being out 7 cm and was scheduled for PICC exchange which he underwent (9/24).  Presented to the ED 9/27 with fevers 103 and erythema around PICC. Concern for sepsis and PICC exchanged. CT abdomen (9/28): increasing infiltration and atelectasis in the lung bases possible PNA. PICC discontinued 9/27. BCX MSSA x 2. Echo EF 20%. No obvious vegetation. Surveillance bcx remain negative.   Remains on milrinone 0.375 mcg. Yesterday he developed acute r respiratory distress after power picc place. Recived IV lasix, morphine, nitro, and xopnex. He improved back to his baseline.   Renal function unchanged. Weight down 2 pounds. Continues on cefipime for PNA and MSSA bacteremia.   Denies dyspnea/orthopnea.    Long discussion with him and his wife about prognosis and EOL issues. He wants to continue with aggressive care for now including inotropes. Not interested in SNF or Hospice.  Creatinine 1.4> 1.6>1.9>1.93  Objective:   Weight Range:  Vital Signs:   Temp:  [98 F (36.7 C)-98.6 F (37 C)] 98 F (36.7 C) (10/07 0557) Pulse Rate:  [79-91] 79 (10/07 0557) Resp:  [18] 18 (10/07 0557) BP: (90-94)/(43-56) 92/43 mmHg (10/07 0557) SpO2:  [98 %-99 %] 99 % (10/07 0557) Weight:  [163 lb 2.3 oz (74 kg)] 163 lb 2.3 oz (74 kg) (10/07 0557) Last BM Date: 03/25/14  Weight change: Filed Weights   03/24/14 1031 03/25/14 0509 03/26/14 0557  Weight: 167 lb 12.3 oz (76.1 kg) 165 lb 3.2 oz (74.934 kg) 163 lb 2.3 oz (74 kg)    Intake/Output:   Intake/Output Summary (Last 24 hours) at 03/26/14 0851 Last data filed at 03/25/14 2000  Gross per 24 hour  Intake 1018.8  ml  Output   1000 ml  Net   18.8 ml     Physical Exam: General:  Elderly appearing. No resp difficulty, on 2L [place] Sitting in chair.  HEENT: normal Neck: supple. JVP 8-9 Carotids 2+ bilat; no bruits. No lymphadenopathy or thryomegaly appreciated.  Cor: PMI laterally displaced + S3 R upper chest PICC placed. Distant regular Lungs: diminished in the bases. Abdomen: tender, + distended. No hepatosplenomegaly. No bruits or masses. Good bowel sounds. Extremities: no cyanosis, clubbing, rash, R and LLE 2-3+ edmea. With ted hose on.   Neuro: alert & orientedx3, cranial nerves grossly intact. moves all 4 extremities w/o difficulty. Affect pleasant  Telemetry: V paced 90s  Labs: Basic Metabolic Panel:  Recent Labs Lab 03/22/14 0253 03/23/14 0247 03/24/14 0230 03/25/14 0535 03/26/14 0500  NA 140 136* 138 142 141  K 4.0 3.4* 4.1 3.5* 3.5*  CL 97 90* 91* 92* 92*  CO2 32 36* 36* 37* 39*  GLUCOSE 131* 156* 180* 172* 127*  BUN 37* 37* 42* 51* 49*  CREATININE 1.37* 1.48* 1.67* 1.99* 1.93*  CALCIUM 8.2* 8.7 9.1 8.8 8.8    Liver Function Tests: No results found for this basename: AST, ALT, ALKPHOS, BILITOT, PROT, ALBUMIN,  in the last 168 hours No results found for this basename: LIPASE, AMYLASE,  in the last 168 hours No results found for this basename: AMMONIA,  in the last 168 hours  CBC:  Recent Labs Lab 03/20/14 0330  03/22/14 0253 03/23/14 0247  WBC 12.8* 14.2* 14.0*  NEUTROABS 10.5*  --   --   HGB 9.9* 10.4* 11.3*  HCT 30.4* 31.6* 34.0*  MCV 94.1 95.8 92.9  PLT 266 336 399    Cardiac Enzymes: No results found for this basename: CKTOTAL, CKMB, CKMBINDEX, TROPONINI,  in the last 168 hours  BNP: BNP (last 3 results)  Recent Labs  11/07/13 0708 03/01/14 1959 03/08/14 2230  PROBNP 9356.0* 11880.0* 15334.0*     Other results:  EKG:   Imaging: Ir Fluoro Guide Cv Line Right  03/24/2014   CLINICAL DATA:  Chronic heart failure and need for tunneled central  venous catheter for chronic milrinone infusion therapy.  EXAM: TUNNELED CENTRAL VENOUS CATHETER PLACEMENT WITH ULTRASOUND AND FLUOROSCOPIC GUIDANCE  ANESTHESIA/SEDATION: 1.0 mg IV Versed; 50 mcg IV Fentanyl.  Total Moderate Sedation Time  35 minutes.  MEDICATIONS: No additional medications.  FLUOROSCOPY TIME:  12 seconds.  PROCEDURE: The procedure, risks, benefits, and alternatives were explained to the patient. Questions regarding the procedure were encouraged and answered. The patient understands and consents to the procedure.  The right neck and chest were prepped with chlorhexidine in a sterile fashion, and a sterile drape was applied covering the operative field. Maximum barrier sterile technique with sterile gowns and gloves were used for the procedure. Local anesthesia was provided with 1% lidocaine.  Ultrasound was used to confirm patency of the right internal jugular vein. After creating a small venotomy incision, a 21 gauge needle was advanced into the right internal jugular vein under direct, real-time ultrasound guidance. Ultrasound image documentation was performed. After securing guidewire access, an 6 French peel-away sheath was placed. A wire was kinked to measure appropriate catheter length.  A 6 Fr dual lumen Power Line tunneled catheter measuring 25 cm from tip to cuff was chosen for placement. This was tunneled in a retrograde fashion from the chest wall to the venotomy incision.  The catheter was then placed through the sheath and the sheath removed. Final catheter positioning was confirmed and documented with a fluoroscopic spot image. The catheter was aspirated and flushed with saline  The venotomy incision was closed with subcuticular 4-0 Vicryl. Dermabond was applied to the incision. The catheter exit site was secured with 0-Prolene retention sutures.  COMPLICATIONS: None.  No pneumothorax.  FINDINGS: After catheter placement, the tip lies at the cavoatrial junction. The catheter aspirates  normally and is ready for immediate use.  IMPRESSION: Placement of tunneled central venous catheter via the right internal jugular vein. The catheter tip lies at the cavoatrial junction. The catheter is ready for immediate use.   Electronically Signed   By: Aletta Edouard M.D.   On: 03/24/2014 16:56   Ir US Guide Vasc Access Right  03/24/2014   CLINICAL DATA:  Chronic heart failure and need for tunneled central venous catheter for chronic milrinone infusion therapy.  EXAM: TUNNELED CENTRAL VENOUS CATHETER PLACEMENT WITH ULTRASOUND AND FLUOROSCOPIC GUIDANCE  ANESTHESIA/SEDATION: 1.0 mg IV Versed; 50 mcg IV Fentanyl.  Total Moderate Sedation Time  35 minutes.  MEDICATIONS: No additional medications.  FLUOROSCOPY TIME:  12 seconds.  PROCEDURE: The procedure, risks, benefits, and alternatives were explained to the patient. Questions regarding the procedure were encouraged and answered. The patient understands and consents to the procedure.  The right neck and chest were prepped with chlorhexidine in a sterile fashion, and a sterile drape was applied covering the operative field. Maximum barrier sterile technique with sterile gowns and gloves were used  for the procedure. Local anesthesia was provided with 1% lidocaine.  Ultrasound was used to confirm patency of the right internal jugular vein. After creating a small venotomy incision, a 21 gauge needle was advanced into the right internal jugular vein under direct, real-time ultrasound guidance. Ultrasound image documentation was performed. After securing guidewire access, an 6 French peel-away sheath was placed. A wire was kinked to measure appropriate catheter length.  A 6 Fr dual lumen Power Line tunneled catheter measuring 25 cm from tip to cuff was chosen for placement. This was tunneled in a retrograde fashion from the chest wall to the venotomy incision.  The catheter was then placed through the sheath and the sheath removed. Final catheter positioning was  confirmed and documented with a fluoroscopic spot image. The catheter was aspirated and flushed with saline  The venotomy incision was closed with subcuticular 4-0 Vicryl. Dermabond was applied to the incision. The catheter exit site was secured with 0-Prolene retention sutures.  COMPLICATIONS: None.  No pneumothorax.  FINDINGS: After catheter placement, the tip lies at the cavoatrial junction. The catheter aspirates normally and is ready for immediate use.  IMPRESSION: Placement of tunneled central venous catheter via the right internal jugular vein. The catheter tip lies at the cavoatrial junction. The catheter is ready for immediate use.   Electronically Signed   By: Aletta Edouard M.D.   On: 03/24/2014 16:56   Dg Chest Port 1 View  03/24/2014   CLINICAL DATA:  Shortness of breath. History of hypertension and CHF. History of atrial fibrillation. Subsequent encounter.  EXAM: PORTABLE CHEST - 1 VIEW  COMPARISON:  03/16/2014.  FINDINGS: Right IJ sheath in cardiac pacer in stable position. Cardiomegaly with pulmonary vascular prominence diffuse bilateral pulmonary infiltrates noted consistent congestive heart failure with pulmonary edema. Similar findings noted on prior study. Small pleural effusions cannot be excluded. No pneumothorax.  IMPRESSION: 1. Right IJ sheath in stable position. 2. Congestive heart failure with pulmonary edema, no significant change from prior exam .   Electronically Signed   By: [place]   On: 03/24/2014 16:23     Medications:     Scheduled Medications: . acidophilus  2 capsule Oral Daily  . amiodarone  200 mg Oral BID  . atorvastatin  20 mg Oral q1800  . budesonide (PULMICORT) nebulizer solution  0.25 mg Nebulization BID  .  ceFAZolin (ANCEF) IV  2 g Intravenous Q12H  . feeding supplement (ENSURE COMPLETE)  237 mL Oral TID BM  . furosemide  80 mg Intravenous BID  . loratadine  10 mg Oral Daily  . magnesium oxide  400 mg Oral Daily  . potassium chloride SA  20  mEq Oral BID  . sildenafil  20 mg Oral TID  . sodium chloride  10-40 mL Intracatheter Q12H  . sodium chloride  3 mL Intravenous Q12H  . Warfarin - Pharmacist Dosing Inpatient   Does not apply q1800    Infusions: . sodium chloride 10 mL/hr at 03/25/14 2202  . 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 Conwell is well  known to the HF team and was just admitted for AKI and abdominal distention. He went home on 9/18 and called the HF clinic last week d/t PICC being out 7 cm. Underwent exchange 9/24 and subsequently presented to the ED on 9/27 with fevers and erythema around PICC line. Bcx 2/2 MSSA. He also has PNA noted on CT.   Bcx 2/2 for MSSA. ID has seen. Infectious symptoms improving.  Place power picc in RIJ line today  IR Continue abx per ID (thanks). No fever. Will need to watch closely.   Volume status remains elevated. Continue IV lasix for now. Add 2.5 mg metolazone.  Renal function up but given IV lasix.    INR 2.8 Pharmacy adjusting (appreciate their assistance)  [facility] to resume care for home milrinone.   Will discuss case with Tyler Christensen to see if she can help with Palliative Services.   Tyler Christensen,[name]  NP-C  03/26/2014  Patient seen and examined with Darrick Grinder, NP. We discussed all aspects of the encounter. I agree with the assessment and plan as stated above.   Volume status remains elevated. Agree with continuing IV diuresis and metolazone.    Daniel Bensimhon,MD 6:05 PM

## 2014-03-26 NOTE — Progress Notes (Signed)
[facility] PROGRESS NOTE Interim History: MR. Heinbaugh is a 80 y.o. male w/ PMHx significant for NICM with EF of 20% on chronic milrinone, HTN, pulm HTN, chronic renal insufficiency (baseline CR 1.8), retroperitoneal mass and PAF.Presented to the ED 9/27 with fevers 103 and erythema around PICC. Concern for sepsis.PICC discontinued 9/27 d/t blood cultures Bcx 2/2 with MSSA. CXR showed possible HCAP, started on vanc and cefepime, then de-escalated to cefepime TTE negative ID recommended to cont Cefepime for 2 weeks. New picc line on 10.5.2015. He completed his HCAP treatment will de-escalte to ancef.  Filed Weights   03/24/14 1031 03/25/14 0509 03/26/14 0557  Weight: 76.1 kg (167 lb 12.3 oz) 74.934 kg (165 lb 3.2 oz) 74 kg (163 lb 2.3 oz)        Intake/Output Summary (Last 24 hours) at 03/26/14 2259 Last data filed at 03/26/14 2133  Gross per 24 hour  Intake 1014.07 ml  Output   2328 ml  Net -1313.93 ml     Assessment/Plan: Severe sepsis due to MSSA bacteremia and HCAP: - Multifactorial secondary to MSSA bacteremia (due to PICC line) and probable HCAP. - Insert PICC line on 10.5.2015 - Sensitive to cefepime MSSA, he has completed his HCAP antibiotic regimen will de-escalte to ancef. - ID consulted. TTE does not show valvular abnormality  - cont Ancef until 10.12.2015 and repeat blood cultures in 1 week after completing treatment.  Acute respiratory failure with hypoxia due to acute on chronic systolic heart failure: - had an episode of respiratory distress after central line on 10.6.2015. - Secondary to acute on chronic CHF exacerbation and  HCAP.  - Patient currently on milrinone, metolazone and torsemide. -still with elevated volume status - Advance heart failure team on board; will follow rec's  Nausea and emesis  - due to acute decompensated HF and sepsis, now resolved -according to patient appetite better now that he is not vomiting  Acute on chronic kidney disease  stage III  - Elevated creatinine was likely secondary  Heart failure. - Renal function has worsen. Received extra dose of lasix due to respiratory distress. -current Cr 1.93, will monitor closley  Paroxysmal atrial fibrillation  -Currently rate controlled on amiodarone. INR is therapeutic cont dose per pharmacy.  pulmonary hypertension  -Continue slidenafil.   Severe protein caloric malnutrition: - cont ensure. -patient encourage to increase PO intake  Diarrhea  - C. difficile PCR negative. - WBC down from almost 30,000 on admission . -stool is more form now  Code Status: full Family Communication: no family at bedside Disposition Plan: inpatient   Consultants:  Cardiology  ID  Procedures: ECHO: does not show valvular abnormality   Antibiotics:  Vanc 9.28.2015>>>9.30.2015  cefepime 9.28.2015>>03-25-14  Ancef until 03/31/14  HPI/Subjective: His breathing cont to improve. No fever. Denies CP. Patient is feeling stronger.  Objective: Filed Vitals:   03/26/14 0930 03/26/14 1340 03/26/14 2020 03/26/14 2133  BP:  99/40  89/38  Pulse:  84 82 82  Temp:  97.8 F (36.6 C)  98.3 F (36.8 C)  TempSrc:  Oral  Oral  Resp:  18 16 16   Height:      Weight:      SpO2: 97% 99% 98% 97%     Exam:  General: Alert, awake, oriented x3, in no acute distress. Feeling better and breathing easier HEENT: +JVD Heart: Regular rate and rhythm. 2-3+ edema Lungs: fair air movement, no wheezing; scattered rhonchi Abdomen: Soft, nontender, distended abdomen positive bowel sounds.  Data Reviewed: Basic Metabolic Panel:  Recent Labs Lab 03/22/14 0253 03/23/14 0247 03/24/14 0230 03/25/14 0535 03/26/14 0500  NA 140 136* 138 142 141  K 4.0 3.4* 4.1 3.5* 3.5*  CL 97 90* 91* 92* 92*  CO2 32 36* 36* 37* 39*  GLUCOSE 131* 156* 180* 172* 127*  BUN 37* 37* 42* 51* 49*  CREATININE 1.37* 1.48* 1.67* 1.99* 1.93*  CALCIUM 8.2* 8.7 9.1 8.8 8.8   CBC:  Recent Labs Lab  03/20/14 0330 03/22/14 0253 03/23/14 0247  WBC 12.8* 14.2* 14.0*  NEUTROABS 10.5*  --   --   HGB 9.9* 10.4* 11.3*  HCT 30.4* 31.6* 34.0*  MCV 94.1 95.8 92.9  PLT 266 336 399   Cardiac Enzymes: No results found for this basename: CKTOTAL, CKMB, CKMBINDEX, TROPONINI,  in the last 168 hours BNP (last 3 results)  Recent Labs  11/07/13 0708 03/01/14 1959 03/08/14 2230  PROBNP 9356.0* 11880.0* 15334.0*   CBG:  Recent Labs Lab 03/19/14 2351 03/20/14 0553 03/21/14 1703  GLUCAP 119* 121* 178*    Recent Results (from the past 240 hour(s))  MRSA PCR SCREENING     Status: None   Collection Time    03/17/14  6:22 AM      Result Value Ref Range Status   MRSA by PCR NEGATIVE  NEGATIVE Final   Comment:            The GeneXpert MRSA Assay (FDA     approved for NASAL specimens     only), is one component of a     comprehensive MRSA colonization     surveillance program. It is not     intended to diagnose MRSA     infection nor to guide or     monitor treatment for     MRSA infections.  CULTURE, BLOOD (ROUTINE X 2)     Status: None   Collection Time    03/18/14  4:50 PM      Result Value Ref Range Status   Specimen Description BLOOD RIGHT ARM   Final   Special Requests BOTTLES DRAWN AEROBIC ONLY 2CC   Final   Culture  Setup Time     Final   Value: 03/18/2014 22:28     Performed at [facility]   Culture     Final   Value: NO GROWTH 5 DAYS     Performed at [facility]   Report Status 03/24/2014 FINAL   Final  CLOSTRIDIUM DIFFICILE BY PCR     Status: None   Collection Time    03/19/14  6:00 PM      Result Value Ref Range Status   C difficile by pcr NEGATIVE  NEGATIVE Final  CLOSTRIDIUM DIFFICILE BY PCR     Status: None   Collection Time    03/22/14  2:29 PM      Result Value Ref Range Status   C difficile by pcr NEGATIVE  NEGATIVE Final     Studies: No results found.  Scheduled Meds: . acidophilus  2 capsule Oral Daily  . amiodarone  200 mg  Oral BID  . atorvastatin  20 mg Oral q1800  . budesonide (PULMICORT) nebulizer solution  0.25 mg Nebulization BID  .  ceFAZolin (ANCEF) IV  2 g Intravenous Q12H  . feeding supplement (ENSURE COMPLETE)  237 mL Oral TID BM  . furosemide  80 mg Intravenous BID  . loratadine  10 mg Oral Daily  . magnesium oxide  400 mg Oral Daily  . potassium chloride SA  40 mEq Oral BID  . sildenafil  20 mg Oral TID  . sodium chloride  10-40 mL Intracatheter Q12H  . sodium chloride  3 mL Intravenous Q12H  . Warfarin - Pharmacist Dosing Inpatient   Does not apply q1800   Continuous Infusions: . sodium chloride 10 mL/hr at 03/26/14 2014  . milrinone 0.375 mcg/kg/min (03/26/14 1214)    Time < 30 minutes  Barton Dubois  [facility] Pager [phone] If 8PM-8AM, please contact night-coverage at [email or web address], password [facility] 03/26/2014, 10:59 PM  LOS: 10 days

## 2014-03-26 NOTE — Progress Notes (Signed)
ANTICOAGULATION CONSULT NOTE - Follow Up Consult  Pharmacy Consult for Coumadin  Indication: atrial fibrillation   Allergies  Allergen Reactions  . Lisinopril Rash    Patient Measurements: Height: 5\' 8"  (172.7 cm) Weight: 163 lb 2.3 oz (74 kg) IBW/kg (Calculated) : 68.4  Vital Signs: Temp: 98 F (36.7 C) (10/07 0557) Temp Source: Oral (10/07 0557) BP: 92/43 mmHg (10/07 0557) Pulse Rate: 79 (10/07 0557)  Labs:  Recent Labs  03/24/14 0230 03/25/14 0535 03/26/14 0500  LABPROT 26.0* 30.3* 29.5*  INR 2.38* 2.90* 2.80*  CREATININE 1.67* 1.99* 1.93*    Estimated Creatinine Clearance: 30 ml/min (by C-G formula based on Cr of 1.93).  Assessment: 81yom continues on coumadin for afib - on amio. INR was elevated on admit and he received vitamin K 2.5mg  x 1. Coumadin resumed,  INR 2.38>>2.9>>2.8. Patient sensitive to small doses of warfarin . Hgb stable, no s/sx bleeding.  Home dose: 4mg  daily except 2mg  Friday and Sunday - will need lower dose at d/c.  Goal of Therapy:  INR 2-3 Monitor platelets by anticoagulation protocol: Yes   Plan:  1) Coumadin 2 mg x1 2) Daily INR, monitor Hgb  Megan E. Supple, Pharm.D Clinical Pharmacy Resident Pager: [phone] 03/26/2014 8:55 AM

## 2014-03-27 LAB — PROTIME-INR
INR: 2.61 — ABNORMAL HIGH (ref 0.00–1.49)
Prothrombin Time: 27.9 seconds — ABNORMAL HIGH (ref 11.6–15.2)

## 2014-03-27 LAB — BASIC METABOLIC PANEL
Anion gap: 11 (ref 5–15)
BUN: 47 mg/dL — AB (ref 6–23)
CHLORIDE: 93 meq/L — AB (ref 96–112)
CO2: 37 meq/L — AB (ref 19–32)
Calcium: 8.6 mg/dL (ref 8.4–10.5)
Creatinine, Ser: 1.79 mg/dL — ABNORMAL HIGH (ref 0.50–1.35)
GFR calc Af Amer: 40 mL/min — ABNORMAL LOW (ref >90)
GFR calc non Af Amer: 34 mL/min — ABNORMAL LOW (ref >90)
GLUCOSE: 115 mg/dL — AB (ref 70–99)
POTASSIUM: 3.6 meq/L — AB (ref 3.7–5.3)
Sodium: 141 mEq/L (ref 137–147)

## 2014-03-27 MED ORDER — METOLAZONE 2.5 MG PO TABS
2.5000 mg | ORAL_TABLET | Freq: Once | ORAL | Status: AC
  Administered 2014-03-27: 2.5 mg via ORAL
  Filled 2014-03-27: qty 1

## 2014-03-27 MED ORDER — WARFARIN SODIUM 2 MG PO TABS
2.0000 mg | ORAL_TABLET | Freq: Once | ORAL | Status: AC
  Administered 2014-03-27: 2 mg via ORAL
  Filled 2014-03-27: qty 1

## 2014-03-27 NOTE — Discharge Instructions (Signed)
Insuficiencia cardaca (Heart Failure) La insuficiencia cardaca es una afeccin en la que el corazn tiene dificultad para bombear la [place]. El corazn no bombea sangre de [place] eficiente para que el organismo pueda funcionar bien. En algunos casos de insuficiencia cardaca, los lquidos vuelven a los pulmones, o puede haber hinchazn (edema) en la zona inferior de las piernas. La insuficiencia cardaca por lo general es una enfermedad de larga duracin (crnica). Es importante que se cuide mucho y que siga el plan de tratamiento que le indique su mdico. CAUSAS  Algunas enfermedades y condiciones pueden causar insuficiencia cardaca. Estas incluyen lo siguiente:  Presin arterial elevada (hipertensin). La hipertensin hace que el msculo cardaco trabaje ms de lo normal. Cuando la presin en los vasos sanguneos es alta, el corazn tiene que bombear (contraerse) con ms fuerza para Armed forces technical officer la sangre por todo el cuerpo. La hipertensin arterial hace que con el tiempo el corazn se vuelva rgido y dbil.  Enfermedad arterial coronaria (EAC). La enfermedad arterial coronaria es la acumulacin de colesterol y grasa (placa) en las arterias del corazn. La obstruccin de las arterias priva al msculo cardaco de sangre y oxgeno. Esto ocasiona dolor en el pecho y puede conducir a un infarto. La hipertensin arterial tambin favorece la enfermedad arterial coronaria.  Ataque cardaco (infarto de miocardio). El ataque cardaco se produce cuando se obstruye una o ms arterias del corazn. La prdida de oxgeno daa el tejido muscular cardaco. Cuando esto ocurre, una parte del msculo cardaco muere. El tejido daado no se contrae bien y Public house manager la capacidad del corazn para Chiropodist.  Vlvulas cardacas anormales. Cuando las vlvulas cardacas no se abren y cierran como corresponde, pueden causar insuficiencia cardaca. Esto hace que el msculo cardaco tenga que bombear con ms intensidad  para que la [place].  Enfermedad del msculo cardaco (miocardiopata o miocarditis). En esta enfermedad, el msculo cardaco est daado por diversas causas. Entre ellas se encuentran el consumo de alcohol o drogas, las infecciones o pueden ser causas desconocidas. Todas estas causas aumentan el riesgo de insuficiencia cardaca.  Enfermedad pulmonar. Enfermedad pulmonar que hace que el corazn se esfuerce ms porque los pulmones no funcionan correctamente. Esto puede hacer que el corazn se tensione, y causar insuficiencia.  Diabetes. La diabetes aumenta el riesgo de insuficiencia cardaca. El nivel elevado de azcar en la sangre contribuye a aumentar los [place] de grasas (lpidos) en la [place]. La diabetes tambin favorece que lentamente se daen los pequeos vasos sanguneos que transportan nutrientes importantes al el msculo cardaco. Cuando el corazn no obtiene oxgeno y alimento suficiente, se debilita y se torna rgido. Por lo tanto no se contrae de Set designer.  Hay otras enfermedades y condiciones que pueden contribuir a la insuficiencia cardaca. Entre ellas se incluyen el ritmo cardaco anormal, los problemas de tiroides y los recuentos bajos de glbulos rojos (anemia). Determinadas conductas perjudiciales aumentan el riesgo de insuficiencia cardaca, por ejemplo:  Tener sobrepeso.  Fumar o mascar tabaco.  Consumir alimentos ricos en grasas y colesterol.  Abusar de las drogas ilcitas o del alcohol.  La falta de actividad fsica. SNTOMAS  Los sntomas pueden variar y ser difciles de Hydrographic surveyor. Los sntomas pueden incluir:  Falta de aire al realizar actividades como subir escaleras.  Tos persistente.  Hinchazn de los pies, tobillos, piernas o abdomen.  Aumento de peso sin motivo.  Dificultad para respirar al estar acostado (ortopnea).  Despertarse con la necesidad de sentarse y tomar aire.  Latidos cardacos rpidos.  Fatiga y prdida de  Teacher, early years/pre.  Mareos, o sensacin de desmayo o desvanecimiento.  Prdida del apetito.  Nuseas.  Orina con ms frecuencia durante la noche (nocturia). DIAGNSTICO  El diagnstico se realiza segn la historia clnica, los sntomas, el examen fsico y las pruebas diagnsticas. Las pruebas diagnsticas son:  Lawyer.  Electrocardiograma.  Radiografa de trax.  Anlisis de [place].  Prueba de esfuerzo.  Angiografa cardaca.  Gammagrafa. TRATAMIENTO  El tratamiento est destinado al control de los sntomas. Podr ser necesario que tome medicamentos, realice cambios en su estilo de vida o se someta a una intervencin [place] para tratar la insuficiencia cardaca.  Los medicamentos para el tratamiento de la insuficiencia cardaca son:  Inhibidores de la enzima convertidora de angiotensina (IECA). Este tipo de [employer] bloquea los efectos de una protena llamada enzima convertidora de angiotensina. Los inhibidores de la ECA Engineer, agricultural (dilatan) los vasos sanguneos y ayudan a Equities trader la presin arterial.  Bloqueadores de los receptores de angiotensina [place]). Este tipo de medicamento bloquea las acciones de una protena de la sangre llamada angiotensina. Los bloqueadores de los receptores de angiotensina dilatan los vasos sanguneos y [place] a reducir la presin arterial.  Medicamentos para eliminar los lquidos (diurticos). Los diurticos [employer] los riones eliminen la sal y el agua de la [place]. El lquido en exceso es eliminado con la [place]. Esta eliminacin del exceso de lquido disminuye el volumen de sangre que el corazn bombea.  Betabloqueantes. Los betabloqueantes evitan que el corazn palpite demasiado rpido y mejoran la fuerza del msculo cardaco.  Digitlicos. Aumentan la fuerza de los latidos cardacos.  Los [employer] un estilo de vida saludable incluyen:  Science writer y Theatre manager un peso saludable.  Dejar de fumar o mascar tabaco.  Consumir alimentos  cardiosaludables.  Limitar o evitar el alcohol.  Dejar de consumir drogas ilcitas.  Hacer actividad fsica segn las indicaciones de su mdico.  Los tratamientos quirrgicos para la insuficiencia cardaca pueden ser:  Un procedimiento para abrir las arterias obstruidas, reparacin de vlvulas cardacas daadas o la extirpacin del tejido muscular cardaco daado.  La colocacin de un marcapasos para ayudar al funcionamiento del msculo cardaco y para controlar ciertos ritmos cardacos anormales.  Un desfibrilador cardioversor interno para tratar determinados ritmos cardacos graves anormales.  Un dispositivo de asistencia ventricular izquierda (DAVI) para ayudar a que el corazn [employer]. [place] los medicamentos solamente como se lo haya indicado el mdico. Los medicamentos son importantes para reducir la carga de trabajo del corazn, disminuir la progresin de la insuficiencia cardaca y Teacher, English as a foreign language los sntomas.  No deje de tomar los medicamentos, excepto si se lo indica su mdico.  No se saltee ninguna dosis de los medicamentos.  Pida una nueva receta antes de quedarse sin medicamentos. Es necesario que tome sus medicamentos todos [place].  Haga actividad fsica moderada si se lo indica su mdico. La actividad fsica moderada puede beneficiar a Psychologist, clinical. Los ancianos y las personas con insuficiencia cardaca grave deben consultarle a su mdico qu actividades fsicas son recomendables para ellos.  Consuma alimentos cardiosaludables. Los alimentos no deben incluir grasas trans y deben tener bajo contenido de grasas saturadas, colesterol y sal (sodio). Las opciones saludables incluyen frutas frescas o congeladas y verduras, pescado, carnes magras, legumbres, productos lcteos descremados o bajos en grasa y cereales integrales o alimentos con alto contenido de [place]. Hable con un nutricionista para aprender ms sobre los alimentos  cardiosaludables.  Limite el  sodio si se lo indica su mdico. La restriccin de sodio puede reducir los sntomas de insuficiencia cardaca en [employer]. Hable con un nutricionista para aprender ms sobre los condimentos cardiosaludables.  Use mtodos de coccin saludables. Los mtodos de coccin saludables incluyen asar, Interior and spatial designer, hervir, Teacher, music, cocer al vapor o IT consultant. Hable con un nutricionista para aprender ms acerca de los mtodos de coccin saludables.  Limite los lquidos si se lo indica su mdico. La restriccin de lquidos puede reducir los sntomas de insuficiencia cardaca en [employer].  Controle su peso a diario. Es importante que se pese todos los das para reconocer anticipadamente cuando hay exceso de lquido. Hgalo todas las maanas despus de orinar y antes de Merchandiser, retail. Use la misma ropa cada vez que se pese. Anote su [employer]. Lleve a su mdico el registro de su peso.  Controle y registre su presin arterial si se lo indica su mdico.  Contrlese el pulso si se lo indica su mdico.  Pierda peso si se lo indica su mdico. La prdida de peso puede reducir los sntomas de insuficiencia cardaca en Psychologist, clinical.  Deje de fumar o mascar tabaco. La nicotina hace que el corazn trabaje ms y [employer] vasos sanguneos. No utilice chicles ni parches de nicotina antes de hablar con su mdico.  Concurra a todas las visitas de control como se lo haya indicado el mdico. Esto es importante.  Limite el consumo de alcohol a no ms de 1 medida por da en las mujeres no embarazadas y 2 medidas en los hombres. Una medida equivale a 12onzas de cerveza, 5onzas de vino o 1onzas de bebidas alcohlicas de alta graduacin. Beber ms puede ser daino para el corazn. Informe a su mdico si bebe alcohol varias veces a la semana. Hable con su mdico acerca de si el alcohol es seguro para usted. Si el corazn ya ha sufrido un dao por el alcohol o sufre una  insuficiencia cardaca grave, debe dejar de consumirlo completamente.  Deje de consumir drogas ilcitas.  [place]. Esto es especialmente importante prevenir las infecciones respiratorias con vacunas actualizadas contra el neumococo y la gripe.  Controle otros problemas de [place], como hipertensin, diabetes, enfermedad de la tiroides o ritmos cardacos anormales segn las indicaciones de su mdico.  Aprenda a Engineer, maintenance (IT).  Planifique perodos de descanso cuando se sienta fatigado.  Aprenda estrategias para manejar las altas temperaturas. Si el clima es extremadamente caluroso:  Evite la actividad fsica intensa.  Use el aire acondicionado o los ventiladores o busque un lugar ms fresco.  Evite la cafena y el alcohol.  Use ropa holgada, ligera y de colores claros.  Aprenda estrategias para manejar el fro. Si el clima es extremadamente fro:  Evite la actividad fsica intensa.  Vstase con varias capas de ropa.  Use mitones o guantes, un sombrero y [place] bufanda cuando salga.  Evite el alcohol.  Reciba asesoramiento y 1 si lo necesita.  Busque programas de rehabilitacin y participe en ellos para mantener o mejorar su independencia y su calidad de vida. SOLICITE ATENCIN MDICA SI:   Aumenta de peso, 3 libras (1,4 kg) en un da o 5 libras (2,3 Kg) en una semana.  Nota que le falta el aire y esto no es habitual en usted.  No puede participar en sus actividades fsicas habituales.  Se cansa con facilidad.  Tose ms de lo normal, especialmente al realizar actividad fsica.  Observa que sus manos,  pies, tobillos o abdomen se le hinchan o estn ms hinchados que lo habitual.  Le cuesta dormir debido a que Film/video editor.  Siente que el corazn late rpido (palpitaciones).  Siente mareos o sensacin de desvanecimiento al ponerse de pie. SOLICITE ATENCIN MDICA DE INMEDIATO SI:   Tiene dificultad para respirar.  Hay una  modificacin en el estado mental, como disminucin de la lucidez o dificultad para concentrarse.  Siente dolor o Adult nurse.  Se desmaya una vez (sncope). ASEGRESE DE QUE:   Comprende estas instrucciones.  Controlar su afeccin.  Recibir ayuda de inmediato si no mejora o si empeora. Document Released: 06/06/2005 Document Revised: 10/21/2013 [facility] Patient Information 2015 [place], [place]. This information is not intended to replace advice given to you by your health care provider. Make sure you discuss any questions you have with your health care provider.

## 2014-03-27 NOTE — Progress Notes (Signed)
[facility] PROGRESS NOTE Interim History: [name] is a 80 y.o. male w/ PMHx significant for NICM with EF of 20% on chronic milrinone, HTN, pulm HTN, chronic renal insufficiency (baseline CR 1.8), retroperitoneal mass and PAF.Presented to the ED 9/27 with fevers 103 and erythema around PICC. Concern for sepsis.PICC discontinued 9/27 d/t blood cultures Bcx 2/2 with MSSA. CXR showed possible HCAP, started on vanc and cefepime, then de-escalated to cefepime TTE negative ID recommended to cont Cefepime for 2 weeks. New picc line on 10.5.2015. He completed his HCAP treatment will de-escalte to ancef.  Filed Weights   03/25/14 0509 03/26/14 0557 03/27/14 0548  Weight: 74.934 kg (165 lb 3.2 oz) 74 kg (163 lb 2.3 oz) 73.5 kg (162 lb 0.6 oz)        Intake/Output Summary (Last 24 hours) at 03/27/14 1739 Last data filed at 03/27/14 1541  Gross per 24 hour  Intake 1384.07 ml  Output   2326 ml  Net -941.93 ml     Assessment/Plan: Severe sepsis due to MSSA bacteremia and HCAP: -Multifactorial secondary to MSSA bacteremia (due to PICC line) and probable HCAP. -Insert PICC line on 10.5.2015 -ensitive to cefepime MSSA, he has completed his HCAP antibiotic regimen will de-escalte to ancef. -ID consulted. TTE does not show valvular abnormality  -cont Ancef until 10.12.2015 and repeat blood cultures in 1 week after completing treatment.  Acute respiratory failure with hypoxia due to acute on chronic systolic heart failure: - had an episode of respiratory distress after central line on 10.6.2015. - Secondary to acute on chronic CHF exacerbation and  HCAP.  - Patient currently on milrinone, metolazone and torsemide. -still with elevated volume status; will continue current regimen and reevaluate volume in am - Advance heart failure team on board; will follow rec's -weight 162 pounds  Nausea and emesis  - due to acute decompensated HF and sepsis, now resolved -according to patient appetite  better now that he is not vomiting  Acute on chronic kidney disease stage III  - Elevated creatinine was likely secondary  Heart failure. - Renal function has worsen. Received extra dose of lasix due to respiratory distress. -current Cr 1.79 (10/8), will monitor closley  Paroxysmal atrial fibrillation  -Currently rate controlled on amiodarone. INR is therapeutic cont dose per pharmacy.  pulmonary hypertension  -Continue sildenafil.   Severe protein caloric malnutrition: - cont ensure. -patient encourage to increase PO intake  Diarrhea  - C. difficile PCR negative. - WBC down from almost 30,000 on admission . -stool is more form now  Code Status: full Family Communication: no family at bedside Disposition Plan: inpatient   Consultants:  Cardiology  ID  Procedures: ECHO: does not show valvular abnormality   Antibiotics:  Vanc 9.28.2015>>>9.30.2015  cefepime 9.28.2015>>03-25-14  Ancef until 03/31/14  HPI/Subjective: His breathing cont to improve. No fever. Denies CP. Patient endorses appetite is better  Objective: Filed Vitals:   03/27/14 0548 03/27/14 0815 03/27/14 1005 03/27/14 1440  BP: 93/40  107/50 93/45  Pulse: 79  78 81  Temp: 97.5 F (36.4 C)   97.9 F (36.6 C)  TempSrc: Oral   Oral  Resp: 19     Height:      Weight: 73.5 kg (162 lb 0.6 oz)     SpO2: 93% 97%  99%     Exam:  General: Alert, awake, oriented x3, in no acute distress. Feeling better and breathing much easier HEENT: +JVD; but lower today Heart: Regular rate and rhythm. 2++ edema  Lungs: fair air movement, no wheezing; scattered rhonchi; no frank crackles seen Abdomen: Soft, nontender, distended abdomen positive bowel sounds.    Data Reviewed: Basic Metabolic Panel:  Recent Labs Lab 03/23/14 0247 03/24/14 0230 03/25/14 0535 03/26/14 0500 03/27/14 0529  NA 136* 138 142 141 141  K 3.4* 4.1 3.5* 3.5* 3.6*  CL 90* 91* 92* 92* 93*  CO2 36* 36* 37* 39* 37*  GLUCOSE 156*  180* 172* 127* 115*  BUN 37* 42* 51* 49* 47*  CREATININE 1.48* 1.67* 1.99* 1.93* 1.79*  CALCIUM 8.7 9.1 8.8 8.8 8.6   CBC:  Recent Labs Lab 03/22/14 0253 03/23/14 0247  WBC 14.2* 14.0*  HGB 10.4* 11.3*  HCT 31.6* 34.0*  MCV 95.8 92.9  PLT 336 399   BNP (last 3 results)  Recent Labs  11/07/13 0708 03/01/14 1959 03/08/14 2230  PROBNP 9356.0* 11880.0* 15334.0*   CBG:  Recent Labs Lab 03/21/14 1703  GLUCAP 178*    Recent Results (from the past 240 hour(s))  CULTURE, BLOOD (ROUTINE X 2)     Status: None   Collection Time    03/18/14  4:50 PM      Result Value Ref Range Status   Specimen Description BLOOD RIGHT ARM   Final   Special Requests BOTTLES DRAWN AEROBIC ONLY 2CC   Final   Culture  Setup Time     Final   Value: 03/18/2014 22:28     Performed at [facility]   Culture     Final   Value: NO GROWTH 5 DAYS     Performed at [facility]   Report Status 03/24/2014 FINAL   Final  CLOSTRIDIUM DIFFICILE BY PCR     Status: None   Collection Time    03/19/14  6:00 PM      Result Value Ref Range Status   C difficile by pcr NEGATIVE  NEGATIVE Final  CLOSTRIDIUM DIFFICILE BY PCR     Status: None   Collection Time    03/22/14  2:29 PM      Result Value Ref Range Status   C difficile by pcr NEGATIVE  NEGATIVE Final     Studies: No results found.  Scheduled Meds: . acidophilus  2 capsule Oral Daily  . amiodarone  200 mg Oral BID  . atorvastatin  20 mg Oral q1800  . budesonide (PULMICORT) nebulizer solution  0.25 mg Nebulization BID  .  ceFAZolin (ANCEF) IV  2 g Intravenous Q12H  . feeding supplement (ENSURE COMPLETE)  237 mL Oral TID BM  . furosemide  80 mg Intravenous BID  . loratadine  10 mg Oral Daily  . magnesium oxide  400 mg Oral Daily  . potassium chloride SA  40 mEq Oral BID  . sildenafil  20 mg Oral TID  . sodium chloride  10-40 mL Intracatheter Q12H  . sodium chloride  3 mL Intravenous Q12H  . warfarin  2 mg Oral ONCE-1800  .  Warfarin - Pharmacist Dosing Inpatient   Does not apply q1800   Continuous Infusions: . sodium chloride 10 mL/hr at 03/26/14 2014  . milrinone 0.375 mcg/kg/min (03/27/14 1154)    Time < 30 minutes  Barton Dubois  [facility] Pager [phone] If 8PM-8AM, please contact night-coverage at [email or web address], password [facility] 03/27/2014, 5:39 PM  LOS: 11 days

## 2014-03-27 NOTE — Telephone Encounter (Signed)
Open in error

## 2014-03-27 NOTE — Progress Notes (Signed)
ANTICOAGULATION CONSULT NOTE - Follow Up Consult  Pharmacy Consult for Coumadin  Indication: atrial fibrillation   Allergies  Allergen Reactions  . Lisinopril Rash    Patient Measurements: Height: 5\' 8"  (172.7 cm) Weight: 162 lb 0.6 oz (73.5 kg) IBW/kg (Calculated) : 68.4  Vital Signs: Temp: 97.5 F (36.4 C) (10/08 0548) Temp Source: Oral (10/08 0548) BP: 107/50 mmHg (10/08 1005) Pulse Rate: 78 (10/08 1005)  Labs:  Recent Labs  03/25/14 0535 03/26/14 0500 03/27/14 0529  LABPROT 30.3* 29.5* 27.9*  INR 2.90* 2.80* 2.61*  CREATININE 1.99* 1.93* 1.79*    Estimated Creatinine Clearance: 32.4 ml/min (by C-G formula based on Cr of 1.79).  Assessment: 81yom continues on coumadin for afib - on amio. INR was elevated on admit and he received vitamin K 2.5mg  x 1. Coumadin resumed,  INR within goal 2-3 for past 5 days. INR 2.61 today. Hgb stable, no s/sx bleeding.  Home dose: 4mg  daily except 2mg  Friday and Sunday - will need lower dose at d/c--has been therapeutic on 2-3mg  daily in hospital.  Goal of Therapy:  INR 2-3 Monitor platelets by anticoagulation protocol: Yes   Plan:  1) Coumadin 2 mg x1 2) Daily INR, monitor Hgb  Megan E. Supple, Pharm.D Clinical Pharmacy Resident Pager: [phone] 03/27/2014 11:22 AM

## 2014-03-27 NOTE — Progress Notes (Signed)
Advanced Heart Failure Rounding Note   Subjective:    [name] is a 80 y.o. male w/ PMHx significant for NICM with EF of 20% on chronic milrinone, HTN, pulm HTN, chronic renal insufficiency (baseline CR 1.8), retroperitoneal mass and PAF.  Just recently discharged 9/18 after AKI. Discharged weight was 160 lbs. Called last week about PICC line being out 7 cm and was scheduled for PICC exchange which he underwent (9/24).  Presented to the ED 9/27 with fevers 103 and erythema around PICC. Concern for sepsis and PICC exchanged. CT abdomen (9/28): increasing infiltration and atelectasis in the lung bases possible PNA. PICC discontinued 9/27. BCX MSSA x 2. Echo EF 20%. No obvious vegetation. Surveillance bcx remain negative.   Remains on milrinone 0.375 mcg. Yesterday he developed acute r respiratory distress after power picc place. Recived IV lasix, morphine, nitro, and xopnex. He improved back to his baseline.   Renal function trending down.  Weight down 1 pounds Continues on cefipime for PNA and MSSA bacteremia.   Denies dyspnea/orthopnea.    Long discussion with him and his wife about prognosis and EOL issues. He wants to continue with aggressive care for now including inotropes. Not interested in SNF or Hospice.  Creatinine 1.93>1.6>1.79  Objective:   Weight Range:  Vital Signs:   Temp:  [97.5 F (36.4 C)-98.3 F (36.8 C)] 97.5 F (36.4 C) (10/08 0548) Pulse Rate:  [78-84] 78 (10/08 1005) Resp:  [16-19] 19 (10/08 0548) BP: (89-107)/(38-50) 107/50 mmHg (10/08 1005) SpO2:  [93 %-99 %] 97 % (10/08 0815) Weight:  [162 lb 0.6 oz (73.5 kg)] 162 lb 0.6 oz (73.5 kg) (10/08 0548) Last BM Date: 03/27/14  Weight change: Filed Weights   03/25/14 0509 03/26/14 0557 03/27/14 0548  Weight: 165 lb 3.2 oz (74.934 kg) 163 lb 2.3 oz (74 kg) 162 lb 0.6 oz (73.5 kg)    Intake/Output:   Intake/Output Summary (Last 24 hours) at 03/27/14 1112 Last data filed at 03/27/14 1014  Gross per 24 hour   Intake 1484.07 ml  Output   3377 ml  Net -1892.93 ml     Physical Exam: General:  Elderly appearing. No resp difficulty, on 2L [place] Sitting in chair.  HEENT: normal Neck: supple. JVP 8-9 Carotids 2+ bilat; no bruits. No lymphadenopathy or thryomegaly appreciated.  Cor: PMI laterally displaced + S3 R upper chest PICC placed. Distant regular Lungs: Lungs clear. Abdomen: nontender, + distended. No hepatosplenomegaly. No bruits or masses. Good bowel sounds. Extremities: no cyanosis, clubbing, rash, R and LLE 2-3+ edema. With ted hose on.   Neuro: alert & orientedx3, cranial nerves grossly intact. moves all 4 extremities w/o difficulty. Affect pleasant  Telemetry: V paced 90s  Labs: Basic Metabolic Panel:  Recent Labs Lab 03/23/14 0247 03/24/14 0230 03/25/14 0535 03/26/14 0500 03/27/14 0529  NA 136* 138 142 141 141  K 3.4* 4.1 3.5* 3.5* 3.6*  CL 90* 91* 92* 92* 93*  CO2 36* 36* 37* 39* 37*  GLUCOSE 156* 180* 172* 127* 115*  BUN 37* 42* 51* 49* 47*  CREATININE 1.48* 1.67* 1.99* 1.93* 1.79*  CALCIUM 8.7 9.1 8.8 8.8 8.6    Liver Function Tests: No results found for this basename: AST, ALT, ALKPHOS, BILITOT, PROT, ALBUMIN,  in the last 168 hours No results found for this basename: LIPASE, AMYLASE,  in the last 168 hours No results found for this basename: AMMONIA,  in the last 168 hours  CBC:  Recent Labs Lab 03/22/14 0253 03/23/14 0247  WBC 14.2* 14.0*  HGB 10.4* 11.3*  HCT 31.6* 34.0*  MCV 95.8 92.9  PLT 336 399    Cardiac Enzymes: No results found for this basename: CKTOTAL, CKMB, CKMBINDEX, TROPONINI,  in the last 168 hours  BNP: BNP (last 3 results)  Recent Labs  11/07/13 0708 03/01/14 1959 03/08/14 2230  PROBNP 9356.0* 11880.0* 15334.0*     Other results:  EKG:   Imaging: No results found.   Medications:     Scheduled Medications: . acidophilus  2 capsule Oral Daily  . amiodarone  200 mg Oral BID  . atorvastatin  20 mg Oral q1800  .  budesonide (PULMICORT) nebulizer solution  0.25 mg Nebulization BID  .  ceFAZolin (ANCEF) IV  2 g Intravenous Q12H  . feeding supplement (ENSURE COMPLETE)  237 mL Oral TID BM  . furosemide  80 mg Intravenous BID  . loratadine  10 mg Oral Daily  . magnesium oxide  400 mg Oral Daily  . potassium chloride SA  40 mEq Oral BID  . sildenafil  20 mg Oral TID  . sodium chloride  10-40 mL Intracatheter Q12H  . sodium chloride  3 mL Intravenous Q12H  . Warfarin - Pharmacist Dosing Inpatient   Does not apply q1800    Infusions: . sodium chloride 10 mL/hr at 03/26/14 2014  . 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 is well known to the HF team and was just admitted for AKI and abdominal distention. He went home on 9/18 and called the HF clinic last week d/t PICC being out 7 cm. Underwent exchange 9/24 and subsequently presented to the ED on 9/27 with fevers and erythema around PICC line. Bcx 2/2 MSSA. He also has PNA noted on CT.   Bcx 2/2 for MSSA. ID has seen. Infectious symptoms improving.  Has  power picc in RIJ line. Continue abx per ID (thanks). No fever. Will need to watch closely.   Volume status improving. Continue IV lasix and 2.5 mg metolazone daily.  Renal function trending down. .   INR 2.6 Pharmacy adjusting (appreciate their assistance)  [facility] to resume care for home milrinone. Has follow up in HF clinic 10/13 9:45   Will discuss case with Rosanne Sack to see if she can help with Palliative Services.   CLEGG,[name]  NP-C  03/27/2014  Patient seen with NP, agree with the above note.  He remains volume overloaded on exam.  Would continue diuresis today with Lasix IV and 1 dose metolazone 2.5.  Creatinine is holding stable.  Reassess volume tomorrow.   [name] 03/27/2014 11:54 AM

## 2014-03-27 NOTE — Progress Notes (Signed)
Physical Therapy Treatment Patient Details Name: Tyler Christensen MRN: [id number] DOB: [birth year] Today's Date: 03/27/2014    History of Present Illness Pt is a 80 y.o. 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 Patient states that he's unable to take anything because of N/V. Patient states his abdominal discomfort also has increased from prior with increasing abdominal distention. In the ER patient was found to be febrile with temperatures around 103F and was initially mildly hypotensive. Chest x-ray shows congestive pattern. Patient on exam has erythema around his right-sided upper extremity PICC line. PICC line was placed in September and patient states that since, it is erythema.    PT Comments    Pt progressing towards physical therapy goals. Focus of session was therapeutic exercise and developing HEP for d/c. Pt anticipating d/c tomorrow. Feel that he continues to be appropriate for HHPT follow up. Will continue to follow.   Follow Up Recommendations  Home health PT;Supervision for mobility/OOB     Equipment Recommendations  Rolling walker with 5" wheels    Recommendations for Other Services       Precautions / Restrictions Precautions Precautions: Fall Precaution Comments: Requires interpreter  Restrictions Weight Bearing Restrictions: No    Mobility  Bed Mobility               General bed mobility comments: Pt sitting in recliner upon PT arrival  Transfers                    Ambulation/Gait                 Stairs            Wheelchair Mobility    Modified Rankin (Stroke Patients Only)       Balance                                    Cognition Arousal/Alertness: Awake/alert Behavior During Therapy: WFL for tasks  assessed/performed Overall Cognitive Status: Within Functional Limits for tasks assessed                      Exercises General Exercises - Lower Extremity Ankle Circles/Pumps: 20 reps Quad Sets: 15 reps Gluteal Sets: 15 reps Long Arc Quad: 15 reps Hip ABduction/ADduction: 15 reps Straight Leg Raises: 15 reps    General Comments        Pertinent Vitals/Pain Pain Assessment: No/denies pain    Home Living                      Prior Function            PT Goals (current goals can now be found in the care plan section) Acute Rehab PT Goals Patient Stated Goal: To return home with his family.  PT Goal Formulation: With patient Time For Goal Achievement: 03/28/14 Potential to Achieve Goals: Good Progress towards PT goals: Progressing toward goals    Frequency  Min 3X/week    PT Plan Current plan remains appropriate    Co-evaluation             End of Session Equipment Utilized During Treatment: Oxygen Activity Tolerance: Patient limited by fatigue Patient left: in chair;with call  bell/phone within reach;with family/visitor present     Time: 3159-4585 PT Time Calculation (min): 21 min  Charges:  $Therapeutic Exercise: 8-22 mins                    G Codes:      Rolinda Roan 04-11-2014, 5:30 PM  Rolinda Roan, PT, DPT Acute Rehabilitation Services Pager: [phone]

## 2014-03-28 LAB — BASIC METABOLIC PANEL
ANION GAP: 12 (ref 5–15)
BUN: 38 mg/dL — ABNORMAL HIGH (ref 6–23)
CALCIUM: 8.8 mg/dL (ref 8.4–10.5)
CO2: 39 meq/L — AB (ref 19–32)
Chloride: 93 mEq/L — ABNORMAL LOW (ref 96–112)
Creatinine, Ser: 1.65 mg/dL — ABNORMAL HIGH (ref 0.50–1.35)
GFR calc Af Amer: 44 mL/min — ABNORMAL LOW (ref >90)
GFR calc non Af Amer: 38 mL/min — ABNORMAL LOW (ref >90)
Glucose, Bld: 123 mg/dL — ABNORMAL HIGH (ref 70–99)
POTASSIUM: 3.9 meq/L (ref 3.7–5.3)
SODIUM: 144 meq/L (ref 137–147)

## 2014-03-28 LAB — PROTIME-INR
INR: 2.24 — AB (ref 0.00–1.49)
Prothrombin Time: 24.8 seconds — ABNORMAL HIGH (ref 11.6–15.2)

## 2014-03-28 MED ORDER — WARFARIN SODIUM 3 MG PO TABS
3.0000 mg | ORAL_TABLET | Freq: Once | ORAL | Status: AC
  Administered 2014-03-28: 3 mg via ORAL
  Filled 2014-03-28 (×3): qty 1

## 2014-03-28 MED ORDER — METOLAZONE 2.5 MG PO TABS
2.5000 mg | ORAL_TABLET | Freq: Every day | ORAL | Status: DC
  Administered 2014-03-28: 2.5 mg via ORAL
  Filled 2014-03-28 (×2): qty 1

## 2014-03-28 NOTE — Progress Notes (Signed)
ANTICOAGULATION CONSULT NOTE - Follow Up Consult  Pharmacy Consult for Coumadin  Indication: atrial fibrillation   Allergies  Allergen Reactions  . Lisinopril Rash    Patient Measurements: Height: 5\' 8"  (172.7 cm) Weight: 164 lb 6.4 oz (74.571 kg) (scale c) IBW/kg (Calculated) : 68.4  Vital Signs: Temp: 97.7 F (36.5 C) (10/09 0600) Temp Source: Oral (10/09 0600) BP: 91/45 mmHg (10/09 0600) Pulse Rate: 78 (10/09 0600)  Labs:  Recent Labs  03/26/14 0500 03/27/14 0529 03/28/14 0430  LABPROT 29.5* 27.9* 24.8*  INR 2.80* 2.61* 2.24*  CREATININE 1.93* 1.79* 1.65*    Estimated Creatinine Clearance: 35.1 ml/min (by C-G formula based on Cr of 1.65).  Assessment: 81yom continues on coumadin for afib - on amio. INR was elevated on admit and he received vitamin K 2.5mg  x 1. Coumadin resumed,  INR within goal 2-3 for past 5 days. INR 2.24 today. No s/sx bleeding.  Home dose: 4mg  daily except 2mg  Friday and Sunday - will need lower dose at d/c--has been therapeutic on 2-3mg  daily in hospital.  Goal of Therapy:  INR 2-3 Monitor platelets by anticoagulation protocol: Yes   Plan:  1) Coumadin 3 mg x1 2) Daily INR  Megan E. Supple, Pharm.D Clinical Pharmacy Resident Pager: [phone] 03/28/2014 9:37 AM

## 2014-03-28 NOTE — Progress Notes (Signed)
Advanced Heart Failure Rounding Note   Subjective:    MR. Noguez is a 80 y.o. male w/ PMHx significant for NICM with EF of 20% on chronic milrinone, HTN, pulm HTN, chronic renal insufficiency (baseline CR 1.8), retroperitoneal mass and PAF.  Just recently discharged 9/18 after AKI. Discharged weight was 160 lbs. Called last week about PICC line being out 7 cm and was scheduled for PICC exchange which he underwent (9/24).  Presented to the ED 9/27 with fevers 103 and erythema around PICC. Concern for sepsis and PICC exchanged. CT abdomen (9/28): increasing infiltration and atelectasis in the lung bases possible PNA. PICC discontinued 9/27. BCX MSSA x 2. Echo EF 20%. No obvious vegetation. Surveillance bcx remain negative.   Remains on milrinone 0.375 mcg. Yesterday he developed acute r respiratory distress after power picc place. Recived IV lasix, morphine, nitro, and xopnex. He improved back to his baseline.   Renal function trending down.  Weight down 1 pounds Continues on cefipime for PNA and MSSA bacteremia.   Denies dyspnea/orthopnea.    Long discussion with him and his wife about prognosis and EOL issues. He wants to continue with aggressive care for now including inotropes. Not interested in SNF or Hospice.  Creatinine 1.93>1.6>1.79  Objective:   Weight Range:  Vital Signs:   Temp:  [97.7 F (36.5 C)-98 F (36.7 C)] 97.9 F (36.6 C) (10/09 1349) Pulse Rate:  [78-91] 79 (10/09 1349) Resp:  [20] 20 (10/09 1349) BP: (91-105)/(45-52) 105/52 mmHg (10/09 1349) SpO2:  [93 %-97 %] 95 % (10/09 1349) Weight:  [74.571 kg (164 lb 6.4 oz)] 74.571 kg (164 lb 6.4 oz) (10/09 0600) Last BM Date: 03/28/14  Weight change: Filed Weights   03/26/14 0557 03/27/14 0548 03/28/14 0600  Weight: 74 kg (163 lb 2.3 oz) 73.5 kg (162 lb 0.6 oz) 74.571 kg (164 lb 6.4 oz)    Intake/Output:   Intake/Output Summary (Last 24 hours) at 03/28/14 1505 Last data filed at 03/28/14 1257  Gross per 24 hour   Intake 1086.67 ml  Output   2452 ml  Net -1365.33 ml     Physical Exam: General:  Elderly appearing. No resp difficulty, on 2L [place] Sitting in chair.  HEENT: normal Neck: supple. JVP 8-9 Carotids 2+ bilat; no bruits. No lymphadenopathy or thryomegaly appreciated.  Cor: PMI laterally displaced + S3 R upper chest PICC placed. Distant regular Lungs: Lungs clear. Abdomen: nontender, + distended. No hepatosplenomegaly. No bruits or masses. Good bowel sounds. Extremities: no cyanosis, clubbing, rash, R and LLE 2-3+ edema. With ted hose on.   Neuro: alert & orientedx3, cranial nerves grossly intact. moves all 4 extremities w/o difficulty. Affect pleasant  Telemetry: V paced 90s  Labs: Basic Metabolic Panel:  Recent Labs Lab 03/24/14 0230 03/25/14 0535 03/26/14 0500 03/27/14 0529 03/28/14 0430  NA 138 142 141 141 144  K 4.1 3.5* 3.5* 3.6* 3.9  CL 91* 92* 92* 93* 93*  CO2 36* 37* 39* 37* 39*  GLUCOSE 180* 172* 127* 115* 123*  BUN 42* 51* 49* 47* 38*  CREATININE 1.67* 1.99* 1.93* 1.79* 1.65*  CALCIUM 9.1 8.8 8.8 8.6 8.8    Liver Function Tests: No results found for this basename: AST, ALT, ALKPHOS, BILITOT, PROT, ALBUMIN,  in the last 168 hours No results found for this basename: LIPASE, AMYLASE,  in the last 168 hours No results found for this basename: AMMONIA,  in the last 168 hours  CBC:  Recent Labs Lab 03/22/14 0253 03/23/14 0247  WBC 14.2* 14.0*  HGB 10.4* 11.3*  HCT 31.6* 34.0*  MCV 95.8 92.9  PLT 336 399    Cardiac Enzymes: No results found for this basename: CKTOTAL, CKMB, CKMBINDEX, TROPONINI,  in the last 168 hours  BNP: BNP (last 3 results)  Recent Labs  11/07/13 0708 03/01/14 1959 03/08/14 2230  PROBNP 9356.0* 11880.0* 15334.0*     Other results:  EKG:   Imaging: No results found.   Medications:     Scheduled Medications: . acidophilus  2 capsule Oral Daily  . amiodarone  200 mg Oral BID  . atorvastatin  20 mg Oral q1800  .  budesonide (PULMICORT) nebulizer solution  0.25 mg Nebulization BID  .  ceFAZolin (ANCEF) IV  2 g Intravenous Q12H  . feeding supplement (ENSURE COMPLETE)  237 mL Oral TID BM  . furosemide  80 mg Intravenous BID  . loratadine  10 mg Oral Daily  . magnesium oxide  400 mg Oral Daily  . potassium chloride SA  40 mEq Oral BID  . sildenafil  20 mg Oral TID  . sodium chloride  10-40 mL Intracatheter Q12H  . sodium chloride  3 mL Intravenous Q12H  . warfarin  3 mg Oral ONCE-1800  . Warfarin - Pharmacist Dosing Inpatient   Does not apply q1800    Infusions: . sodium chloride 10 mL/hr at 03/27/14 2150  . 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 Kunath is well known to the HF team and was just admitted for AKI and abdominal distention. He went home on 9/18 and called the HF clinic last week d/t PICC being out 7 cm. Underwent exchange 9/24 and subsequently presented to the ED on 9/27 with fevers and erythema around PICC line. Bcx 2/2 MSSA. He also has PNA noted on CT.   Bcx 2/2 for MSSA. ID has seen. Infectious symptoms improving.  Has  power picc in RIJ line. Continue abx per ID (thanks). He finishes 10/12.  He is peeing and weight down but still has 2-3+ LE edema into thigh and JVP up. Need to continue diuresis. Continue with lasix 80 IV BID and add metolazone 2.5 daily. Can increase to bid as needed. Renal function continues to improve. Would keep through the weekend.    INR 2.2 Pharmacy adjusting (appreciate their assistance)  [facility] to resume care for home milrinone. Has follow up in HF clinic 10/13 9:45   Will discuss case with Rosanne Sack to see if she can help with Palliative Services.    Glori Bickers MD 03/28/2014 3:05 PM

## 2014-03-28 NOTE — Progress Notes (Signed)
[facility] PROGRESS NOTE Interim History: Tyler Christensen is a 80 y.o. male w/ PMHx significant for NICM with EF of 20% on chronic milrinone, HTN, pulm HTN, chronic renal insufficiency (baseline CR 1.8), retroperitoneal mass and PAF.Presented to the ED 9/27 with fevers 103 and erythema around PICC. Concern for sepsis.PICC discontinued 9/27 d/t blood cultures Bcx 2/2 with MSSA. CXR showed possible HCAP, started on vanc and cefepime, then de-escalated to cefepime TTE negative ID recommended to cont Cefepime for 2 weeks. New picc line on 10.5.2015. He completed his HCAP treatment will de-escalte to ancef.  Filed Weights   03/26/14 0557 03/27/14 0548 03/28/14 0600  Weight: 74 kg (163 lb 2.3 oz) 73.5 kg (162 lb 0.6 oz) 74.571 kg (164 lb 6.4 oz)        Intake/Output Summary (Last 24 hours) at 03/28/14 1818 Last data filed at 03/28/14 1700  Gross per 24 hour  Intake 506.67 ml  Output   2527 ml  Net -2020.33 ml     Assessment/Plan: Severe sepsis due to MSSA bacteremia and HCAP: -Multifactorial secondary to MSSA bacteremia (due to PICC line) and probable HCAP. -Insert PICC line on 10.5.2015 -ensitive to cefepime MSSA, he has completed his HCAP antibiotic regimen will de-escalte to ancef. -ID consulted. TTE does not show valvular abnormality  -cont Ancef until 10.12.2015 and repeat blood cultures in 1 week after completing treatment.  Acute respiratory failure with hypoxia due to acute on chronic systolic heart failure: - had an episode of respiratory distress after central line on 10.6.2015. - Secondary to acute on chronic CHF exacerbation and  HCAP.  - Patient currently on milrinone, metolazone and furosemide. -still with elevated volume status; will continue current regimen over the weekend and reevaluate volume  - Advance heart failure team on board; will follow rec's -continue to have good urine output  Nausea and emesis  -due to acute decompensated HF and sepsis, now  resolved -according to patient appetite better now that he is not vomiting  Acute on chronic kidney disease stage III  - Elevated creatinine was likely secondary  Heart failure. - Renal function has worsen. Received extra dose of lasix due to respiratory distress. -current Cr 1.69 (10/8), will monitor closley  Paroxysmal atrial fibrillation  -Currently rate controlled on amiodarone. INR is therapeutic cont dose per pharmacy.  pulmonary hypertension  -Continue sildenafil.   Severe protein caloric malnutrition: - cont ensure. -patient encourage to increase PO intake  Diarrhea  - C. difficile PCR negative. - WBC down from almost 30,000 on admission . -stool is more form now  Code Status: full Family Communication: no family at bedside Disposition Plan: inpatient   Consultants:  Cardiology  ID  Procedures: ECHO: does not show valvular abnormality   Antibiotics:  Vanc 9.28.2015>>>9.30.2015  cefepime 9.28.2015>>03-25-14  Ancef until 03/31/14  HPI/Subjective: His breathing cont to improved. No fever. Denies CP. Patient endorses appetite is also better and he feels stronger. Still with fluid overload on exam (2-3++ LE edema and positive JVD).  Objective: Filed Vitals:   03/28/14 0118 03/28/14 0600 03/28/14 1008 03/28/14 1349  BP: 102/50 91/45 100/50 105/52  Pulse: 83 78 86 79  Temp: 98 F (36.7 C) 97.7 F (36.5 C) 98 F (36.7 C) 97.9 F (36.6 C)  TempSrc: Oral Oral Oral Oral  Resp: 20 20  20   Height:      Weight:  74.571 kg (164 lb 6.4 oz)    SpO2: 97% 93% 93% 95%     Exam:  General: Alert, awake, oriented x3, in no acute distress. Feeling better and breathing a lot easier HEENT: +JVD; but lower today Heart: Regular rate and rhythm. 2-3++ edema Lungs: fair air movement, no wheezing; scattered rhonchi; no frank crackles seen Abdomen: Soft, nontender, distended abdomen positive bowel sounds.    Data Reviewed: Basic Metabolic Panel:  Recent Labs Lab  03/24/14 0230 03/25/14 0535 03/26/14 0500 03/27/14 0529 03/28/14 0430  NA 138 142 141 141 144  K 4.1 3.5* 3.5* 3.6* 3.9  CL 91* 92* 92* 93* 93*  CO2 36* 37* 39* 37* 39*  GLUCOSE 180* 172* 127* 115* 123*  BUN 42* 51* 49* 47* 38*  CREATININE 1.67* 1.99* 1.93* 1.79* 1.65*  CALCIUM 9.1 8.8 8.8 8.6 8.8   CBC:  Recent Labs Lab 03/22/14 0253 03/23/14 0247  WBC 14.2* 14.0*  HGB 10.4* 11.3*  HCT 31.6* 34.0*  MCV 95.8 92.9  PLT 336 399   BNP (last 3 results)  Recent Labs  11/07/13 0708 03/01/14 1959 03/08/14 2230  PROBNP 9356.0* 11880.0* 15334.0*    Recent Results (from the past 240 hour(s))  CLOSTRIDIUM DIFFICILE BY PCR     Status: None   Collection Time    03/19/14  6:00 PM      Result Value Ref Range Status   C difficile by pcr NEGATIVE  NEGATIVE Final  CLOSTRIDIUM DIFFICILE BY PCR     Status: None   Collection Time    03/22/14  2:29 PM      Result Value Ref Range Status   C difficile by pcr NEGATIVE  NEGATIVE Final     Studies: No results found.  Scheduled Meds: . acidophilus  2 capsule Oral Daily  . amiodarone  200 mg Oral BID  . atorvastatin  20 mg Oral q1800  . budesonide (PULMICORT) nebulizer solution  0.25 mg Nebulization BID  .  ceFAZolin (ANCEF) IV  2 g Intravenous Q12H  . feeding supplement (ENSURE COMPLETE)  237 mL Oral TID BM  . furosemide  80 mg Intravenous BID  . loratadine  10 mg Oral Daily  . magnesium oxide  400 mg Oral Daily  . metolazone  2.5 mg Oral Daily  . potassium chloride SA  40 mEq Oral BID  . sildenafil  20 mg Oral TID  . sodium chloride  10-40 mL Intracatheter Q12H  . sodium chloride  3 mL Intravenous Q12H  . warfarin  3 mg Oral ONCE-1800  . Warfarin - Pharmacist Dosing Inpatient   Does not apply q1800   Continuous Infusions: . sodium chloride 10 mL/hr at 03/27/14 2150  . milrinone 0.375 mcg/kg/min (03/28/14 1316)    Time < 30 minutes  Barton Dubois  [facility] Pager [phone] If 8PM-8AM, please contact  night-coverage at [email or web address], password [facility] 03/28/2014, 6:18 PM  LOS: 12 days

## 2014-03-29 LAB — BASIC METABOLIC PANEL
Anion gap: 11 (ref 5–15)
BUN: 36 mg/dL — ABNORMAL HIGH (ref 6–23)
CO2: 36 mEq/L — ABNORMAL HIGH (ref 19–32)
Calcium: 8.7 mg/dL (ref 8.4–10.5)
Chloride: 91 mEq/L — ABNORMAL LOW (ref 96–112)
Creatinine, Ser: 1.65 mg/dL — ABNORMAL HIGH (ref 0.50–1.35)
GFR, EST AFRICAN AMERICAN: 44 mL/min — AB (ref >90)
GFR, EST NON AFRICAN AMERICAN: 38 mL/min — AB (ref >90)
Glucose, Bld: 201 mg/dL — ABNORMAL HIGH (ref 70–99)
Potassium: 4 mEq/L (ref 3.7–5.3)
Sodium: 138 mEq/L (ref 137–147)

## 2014-03-29 LAB — PROTIME-INR
INR: 2.04 — ABNORMAL HIGH (ref 0.00–1.49)
PROTHROMBIN TIME: 23 s — AB (ref 11.6–15.2)

## 2014-03-29 MED ORDER — WARFARIN SODIUM 3 MG PO TABS
3.0000 mg | ORAL_TABLET | Freq: Once | ORAL | Status: AC
  Administered 2014-03-29: 3 mg via ORAL
  Filled 2014-03-29: qty 1

## 2014-03-29 MED ORDER — METOLAZONE 5 MG PO TABS
5.0000 mg | ORAL_TABLET | Freq: Two times a day (BID) | ORAL | Status: DC
  Administered 2014-03-29 – 2014-03-31 (×6): 5 mg via ORAL
  Filled 2014-03-29 (×10): qty 1

## 2014-03-29 MED ORDER — FUROSEMIDE 10 MG/ML IJ SOLN
120.0000 mg | Freq: Two times a day (BID) | INTRAVENOUS | Status: DC
  Administered 2014-03-29 – 2014-03-30 (×3): 120 mg via INTRAVENOUS
  Filled 2014-03-29 (×6): qty 12

## 2014-03-29 MED ORDER — METOLAZONE 5 MG PO TABS
5.0000 mg | ORAL_TABLET | Freq: Two times a day (BID) | ORAL | Status: DC
  Filled 2014-03-29 (×2): qty 1

## 2014-03-29 NOTE — Progress Notes (Signed)
[facility] PROGRESS NOTE Interim History: Tyler Christensen is a 80 y.o. male w/ PMHx significant for NICM with EF of 20% on chronic milrinone, HTN, pulm HTN, chronic renal insufficiency (baseline CR 1.8), retroperitoneal mass and PAF.Presented to the ED 9/27 with fevers 103 and erythema around PICC. Concern for sepsis.PICC discontinued 9/27 d/t blood cultures Bcx 2/2 with MSSA. CXR showed possible HCAP, started on vanc and cefepime, then de-escalated to cefepime TTE negative ID recommended to cont Cefepime for 2 weeks. New picc line on 10.5.2015. He completed his HCAP treatment will de-escalte to ancef.  Filed Weights   03/27/14 0548 03/28/14 0600 03/29/14 0613  Weight: 73.5 kg (162 lb 0.6 oz) 74.571 kg (164 lb 6.4 oz) 75.388 kg (166 lb 3.2 oz)        Intake/Output Summary (Last 24 hours) at 03/29/14 1437 Last data filed at 03/29/14 1231  Gross per 24 hour  Intake   1029 ml  Output   2202 ml  Net  -1173 ml     Assessment/Plan: Severe sepsis due to MSSA bacteremia and HCAP: -Multifactorial secondary to MSSA bacteremia (due to PICC line) and probable HCAP. -Insert PICC line on 10.5.2015 -ensitive to cefepime MSSA, he has completed his HCAP antibiotic regimen will de-escalte to ancef. -ID consulted. TTE does not show valvular abnormality  -cont Ancef until 10.12.2015 and repeat blood cultures in 1 week after completing treatment.  Acute respiratory failure with hypoxia due to acute on chronic systolic heart failure: - had an episode of respiratory distress after central line on 10.6.2015. - Secondary to acute on chronic CHF exacerbation and  HCAP.  - Patient currently on milrinone, metolazone and furosemide. -still with elevated volume status; will increase lasix to 120mg  BID and reevaluate volume  - Advance heart failure team on board; will follow rec's -continue to have good urine output  Nausea and emesis  -due to acute decompensated HF and sepsis, now resolved -according to  patient appetite better now that he is not vomiting  Acute on chronic kidney disease stage III  - Elevated creatinine was likely secondary  Heart failure. - Renal function has worsen. Received extra dose of lasix due to respiratory distress. -current Cr 1.69 (10/10), will monitor closley  Paroxysmal atrial fibrillation  -Currently rate controlled on amiodarone. INR is therapeutic cont dose per pharmacy.  pulmonary hypertension  -Continue sildenafil.   Severe protein caloric malnutrition: - cont ensure. -patient encourage to increase PO intake  Diarrhea  - C. difficile PCR negative. - WBC down from almost 30,000 on admission . -stool is more form now  Code Status: full Family Communication: no family at bedside Disposition Plan: inpatient   Consultants:  Cardiology  ID  Procedures: ECHO: does not show valvular abnormality   Antibiotics:  Vanc 9.28.2015>>>9.30.2015  cefepime 9.28.2015>>03-25-14  Ancef until 03/31/14  HPI/Subjective: His breathing cont to improved. No fever. Denies CP. Patient endorses appetite is also better and he feels stronger. Still with fluid overload on exam (2-3++ LE edema and positive JVD); weight is also up 2 pounds  Objective: Filed Vitals:   03/29/14 0118 03/29/14 0613 03/29/14 0933 03/29/14 1003  BP: 101/50 101/53  107/56  Pulse: 91 85  84  Temp: 96.7 F (35.9 C) 97.6 F (36.4 C)  98 F (36.7 C)  TempSrc: Oral Oral  Oral  Resp: 18 18  18   Height:      Weight:  75.388 kg (166 lb 3.2 oz)    SpO2: 99% 99% 93% 98%  Exam:  General: Alert, awake, oriented x3, in no acute distress. Feeling better and breathing ok HEENT: +JVD; no bruits Heart: Regular rate and rhythm. 2-3++ edema Lungs: fair air movement, no wheezing; scattered rhonchi; no frank crackles seen Abdomen: Soft, nontender, distended abdomen positive bowel sounds.    Data Reviewed: Basic Metabolic Panel:  Recent Labs Lab 03/25/14 0535 03/26/14 0500  03/27/14 0529 03/28/14 0430 03/29/14 0410  NA 142 141 141 144 138  K 3.5* 3.5* 3.6* 3.9 4.0  CL 92* 92* 93* 93* 91*  CO2 37* 39* 37* 39* 36*  GLUCOSE 172* 127* 115* 123* 201*  BUN 51* 49* 47* 38* 36*  CREATININE 1.99* 1.93* 1.79* 1.65* 1.65*  CALCIUM 8.8 8.8 8.6 8.8 8.7   CBC:  Recent Labs Lab 03/23/14 0247  WBC 14.0*  HGB 11.3*  HCT 34.0*  MCV 92.9  PLT 399   BNP (last 3 results)  Recent Labs  11/07/13 0708 03/01/14 1959 03/08/14 2230  PROBNP 9356.0* 11880.0* 15334.0*    Recent Results (from the past 240 hour(s))  CLOSTRIDIUM DIFFICILE BY PCR     Status: None   Collection Time    03/19/14  6:00 PM      Result Value Ref Range Status   C difficile by pcr NEGATIVE  NEGATIVE Final  CLOSTRIDIUM DIFFICILE BY PCR     Status: None   Collection Time    03/22/14  2:29 PM      Result Value Ref Range Status   C difficile by pcr NEGATIVE  NEGATIVE Final     Studies: No results found.  Scheduled Meds: . acidophilus  2 capsule Oral Daily  . amiodarone  200 mg Oral BID  . atorvastatin  20 mg Oral q1800  . budesonide (PULMICORT) nebulizer solution  0.25 mg Nebulization BID  .  ceFAZolin (ANCEF) IV  2 g Intravenous Q12H  . feeding supplement (ENSURE COMPLETE)  237 mL Oral TID BM  . furosemide  120 mg Intravenous BID  . loratadine  10 mg Oral Daily  . magnesium oxide  400 mg Oral Daily  . metolazone  5 mg Oral BID  . potassium chloride SA  40 mEq Oral BID  . sildenafil  20 mg Oral TID  . sodium chloride  10-40 mL Intracatheter Q12H  . sodium chloride  3 mL Intravenous Q12H  . warfarin  3 mg Oral ONCE-1800  . Warfarin - Pharmacist Dosing Inpatient   Does not apply q1800   Continuous Infusions: . sodium chloride 10 mL/hr at 03/27/14 2150  . milrinone 0.375 mcg/kg/min (03/29/14 1312)    Time < 30 minutes  Barton Dubois  [facility] Pager [phone] If 8PM-8AM, please contact night-coverage at [email or web address], password [facility] 03/29/2014, 2:37 PM  LOS: 13  days

## 2014-03-29 NOTE — Progress Notes (Signed)
Dr. Dyann Kief made aware of low BP: 96/56 automatic, 98/58 manual. Patient is asymptomatic.  No orders given. Continue to monitor patient BP.

## 2014-03-29 NOTE — Progress Notes (Signed)
ANTICOAGULATION CONSULT NOTE - Follow Up Consult  Pharmacy Consult for Coumadin  Indication: atrial fibrillation   Allergies  Allergen Reactions  . Lisinopril Rash    Patient Measurements: Height: 5\' 8"  (172.7 cm) Weight: 166 lb 3.2 oz (75.388 kg) (scale C) IBW/kg (Calculated) : 68.4  Vital Signs: Temp: 98 F (36.7 C) (10/10 1003) Temp Source: Oral (10/10 1003) BP: 107/56 mmHg (10/10 1003) Pulse Rate: 84 (10/10 1003)  Labs:  Recent Labs  03/27/14 0529 03/28/14 0430 03/29/14 0410  LABPROT 27.9* 24.8* 23.0*  INR 2.61* 2.24* 2.04*  CREATININE 1.79* 1.65* 1.65*    Estimated Creatinine Clearance: 35.1 ml/min (by C-G formula based on Cr of 1.65).  Assessment: 81yom continues on coumadin for afib - on amio. INR was elevated on admit 9/27 and increased to 4.32 on 9/28. He received vitamin K 2.5mg  x 1 on 9/28. Coumadin resumed 9/29,  INR within goal 2-3 for past 7 days. INR 2.04 today (INR trend 2.8>2.61<2.24>2.04 today on mostly 2mg  daily except 3mg  doses 10/5 & 10/9 ). No s/sx bleeding. Amiodarone continues as on PTA. MD notes currently rate controlled on amiodarone.   Home dose: 4mg  daily except 2mg  Friday and Sunday - will need lower dose at d/c--has been therapeutic on 2-3mg  daily in hospital.  Goal of Therapy:  INR 2-3 Monitor platelets by anticoagulation protocol: Yes   Plan:  1) Coumadin 3 mg x1 2) Daily INR  Nicole Cella, RPh Clinical Pharmacist Pager: [phone] 03/29/2014 1:48 PM

## 2014-03-29 NOTE — Progress Notes (Signed)
Advanced Heart Failure Rounding Note   Subjective:    [name] is a 80 y.o. male w/ PMHx significant for NICM with EF of 20% on chronic milrinone, HTN, pulm HTN, chronic renal insufficiency (baseline CR 1.8), retroperitoneal mass and PAF.  Just recently discharged 9/18 after AKI. Discharged weight was 160 lbs. Called last week about PICC line being out 7 cm and was scheduled for PICC exchange which he underwent (9/24).  Presented to the ED 9/27 with fevers 103 and erythema around PICC. Concern for sepsis and PICC exchanged. CT abdomen (9/28): increasing infiltration and atelectasis in the lung bases possible PNA. PICC discontinued 9/27. BCX MSSA x 2. Echo EF 20%. No obvious vegetation. Surveillance bcx remain negative.   Remains on milrinone 0.375 mcg. Yesterday he developed acute r respiratory distress after power picc place. Recived IV lasix, morphine, nitro, and xopnex. He improved back to his baseline.   Renal function stable.  Weight up 2 pounds despite addition of metolazone.  Continues on cefipime for PNA and MSSA bacteremia.   Denies dyspnea/orthopnea.   Minimal low back pain.   Long discussion with him and his wife about prognosis and EOL issues. He wants to continue with aggressive care for now including inotropes. Not interested in SNF or Hospice.  Creatinine 1.93>1.6>1.79-> 1.65-> 1.65  Objective:   Weight Range:  Vital Signs:   Temp:  [96.7 F (35.9 C)-98 F (36.7 C)] 97.6 F (36.4 C) (10/10 0613) Pulse Rate:  [79-91] 85 (10/10 0613) Resp:  [18-20] 18 (10/10 0613) BP: (97-105)/(50-63) 101/53 mmHg (10/10 0613) SpO2:  [93 %-100 %] 99 % (10/10 0613) Weight:  [75.388 kg (166 lb 3.2 oz)] 75.388 kg (166 lb 3.2 oz) (10/10 0613) Last BM Date: 03/28/14  Weight change: Filed Weights   03/27/14 0548 03/28/14 0600 03/29/14 0613  Weight: 73.5 kg (162 lb 0.6 oz) 74.571 kg (164 lb 6.4 oz) 75.388 kg (166 lb 3.2 oz)    Intake/Output:   Intake/Output Summary (Last 24 hours) at  03/29/14 0921 Last data filed at 03/29/14 0617  Gross per 24 hour  Intake   1029 ml  Output   2101 ml  Net  -1072 ml     Physical Exam: General:  Elderly appearing. No resp difficulty, on 2L [place] Sitting in chair.  HEENT: normal Neck: supple. JVP 8-9 Carotids 2+ bilat; no bruits. No lymphadenopathy or thryomegaly appreciated.  Cor: PMI laterally displaced + S3 R upper chest PICC placed. Distant regular Lungs: Lungs clear. Abdomen: nontender, + distended. No hepatosplenomegaly. No bruits or masses. Good bowel sounds. Extremities: no cyanosis, clubbing, rash, R and LLE 2-3+ edema. With ted hose on.   Neuro: alert & orientedx3, cranial nerves grossly intact. moves all 4 extremities w/o difficulty. Affect pleasant  Telemetry: V paced 90s  Labs: Basic Metabolic Panel:  Recent Labs Lab 03/25/14 0535 03/26/14 0500 03/27/14 0529 03/28/14 0430 03/29/14 0410  NA 142 141 141 144 138  K 3.5* 3.5* 3.6* 3.9 4.0  CL 92* 92* 93* 93* 91*  CO2 37* 39* 37* 39* 36*  GLUCOSE 172* 127* 115* 123* 201*  BUN 51* 49* 47* 38* 36*  CREATININE 1.99* 1.93* 1.79* 1.65* 1.65*  CALCIUM 8.8 8.8 8.6 8.8 8.7    Liver Function Tests: No results found for this basename: AST, ALT, ALKPHOS, BILITOT, PROT, ALBUMIN,  in the last 168 hours No results found for this basename: LIPASE, AMYLASE,  in the last 168 hours No results found for this basename: AMMONIA,  in the  last 168 hours  CBC:  Recent Labs Lab 03/23/14 0247  WBC 14.0*  HGB 11.3*  HCT 34.0*  MCV 92.9  PLT 399    Cardiac Enzymes: No results found for this basename: CKTOTAL, CKMB, CKMBINDEX, TROPONINI,  in the last 168 hours  BNP: BNP (last 3 results)  Recent Labs  11/07/13 0708 03/01/14 1959 03/08/14 2230  PROBNP 9356.0* 11880.0* 15334.0*     Other results:  EKG:   Imaging: No results found.   Medications:     Scheduled Medications: . acidophilus  2 capsule Oral Daily  . amiodarone  200 mg Oral BID  . atorvastatin   20 mg Oral q1800  . budesonide (PULMICORT) nebulizer solution  0.25 mg Nebulization BID  .  ceFAZolin (ANCEF) IV  2 g Intravenous Q12H  . feeding supplement (ENSURE COMPLETE)  237 mL Oral TID BM  . furosemide  80 mg Intravenous BID  . loratadine  10 mg Oral Daily  . magnesium oxide  400 mg Oral Daily  . metolazone  2.5 mg Oral Daily  . potassium chloride SA  40 mEq Oral BID  . sildenafil  20 mg Oral TID  . sodium chloride  10-40 mL Intracatheter Q12H  . sodium chloride  3 mL Intravenous Q12H  . Warfarin - Pharmacist Dosing Inpatient   Does not apply q1800    Infusions: . sodium chloride 10 mL/hr at 03/27/14 2150  . 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 is well known to the HF team and was just admitted for AKI and abdominal distention. He went home on 9/18 and called the HF clinic last week d/t PICC being out 7 cm. Underwent exchange 9/24 and subsequently presented to the ED on 9/27 with fevers and erythema around PICC line. Bcx 2/2 MSSA. He also has PNA noted on CT.   Bcx 2/2 for MSSA. ID has seen. Infectious symptoms improving.  Has  power picc in RIJ line. Continue abx per ID (thanks). He finishes 10/12.  Still with significant edema despite addition of metolazone. Increase IV lasix to 120 BID. Increase metolazone. Watch renal function closely. Shooting for d/c Monday.   INR 2.0 Pharmacy adjusting (appreciate their assistance)  [facility] to resume  care for home milrinone. Has follow up in HF clinic 10/13 9:45 (will need to change given delayed d/c)  Will discuss case with Tyler Christensen to see if she can help with Palliative Services.    Tyler Bickers MD 03/29/2014 9:21 AM

## 2014-03-30 LAB — BASIC METABOLIC PANEL
Anion gap: 16 — ABNORMAL HIGH (ref 5–15)
BUN: 38 mg/dL — AB (ref 6–23)
CALCIUM: 8.7 mg/dL (ref 8.4–10.5)
CO2: 35 meq/L — AB (ref 19–32)
CREATININE: 1.63 mg/dL — AB (ref 0.50–1.35)
Chloride: 89 mEq/L — ABNORMAL LOW (ref 96–112)
GFR calc Af Amer: 45 mL/min — ABNORMAL LOW (ref >90)
GFR, EST NON AFRICAN AMERICAN: 38 mL/min — AB (ref >90)
GLUCOSE: 191 mg/dL — AB (ref 70–99)
Potassium: 3.6 mEq/L — ABNORMAL LOW (ref 3.7–5.3)
Sodium: 140 mEq/L (ref 137–147)

## 2014-03-30 LAB — CBC
HEMATOCRIT: 31.5 % — AB (ref 39.0–52.0)
Hemoglobin: 10.1 g/dL — ABNORMAL LOW (ref 13.0–17.0)
MCH: 31.4 pg (ref 26.0–34.0)
MCHC: 32.1 g/dL (ref 30.0–36.0)
MCV: 97.8 fL (ref 78.0–100.0)
PLATELETS: 312 10*3/uL (ref 150–400)
RBC: 3.22 MIL/uL — ABNORMAL LOW (ref 4.22–5.81)
RDW: 16.2 % — AB (ref 11.5–15.5)
WBC: 14.2 10*3/uL — ABNORMAL HIGH (ref 4.0–10.5)

## 2014-03-30 LAB — PROTIME-INR
INR: 2.1 — ABNORMAL HIGH (ref 0.00–1.49)
Prothrombin Time: 23.6 seconds — ABNORMAL HIGH (ref 11.6–15.2)

## 2014-03-30 MED ORDER — WARFARIN SODIUM 3 MG PO TABS
3.0000 mg | ORAL_TABLET | Freq: Every day | ORAL | Status: DC
  Administered 2014-03-30: 3 mg via ORAL
  Filled 2014-03-30 (×2): qty 1

## 2014-03-30 NOTE — Progress Notes (Signed)
Blister on left shin and blanchable reddened area (5cm x 3cm) on top of left foot noted during assessment, as well as severe swelling.  Did not place compression stockings back on. Made Dr. Dyann Kief aware. Will discontinue compression stockings and ontinue to monitor for now.

## 2014-03-30 NOTE — Progress Notes (Signed)
Advanced Heart Failure Rounding Note   Subjective:    Tyler Christensen is a 80 y.o. male w/ PMHx significant for NICM with EF of 20% on chronic milrinone, HTN, pulm HTN, chronic renal insufficiency (baseline CR 1.8), retroperitoneal mass and PAF.  Just recently discharged 9/18 after AKI. Discharged weight was 160 lbs. Called last week about PICC line being out 7 cm and was scheduled for PICC exchange which he underwent (9/24).  Presented to the ED 9/27 with fevers 103 and erythema around PICC. Concern for sepsis and PICC exchanged. CT abdomen (9/28): increasing infiltration and atelectasis in the lung bases possible PNA. PICC discontinued 9/27. BCX MSSA x 2. Echo EF 20%. No obvious vegetation. Surveillance bcx remain negative.   Remains on milrinone 0.375 mcg. Yesterday he developed acute r respiratory distress after power picc place. Recived IV lasix, morphine, nitro, and xopnex. He improved back to his baseline.   Feels ok. No dyspnea. Compression stockings removed due to tissue injury on L foot. Lasix and metolazone increased yesterday. Weight down one pound.   Long discussion with him and his wife about prognosis and EOL issues. He wants to continue with aggressive care for now including inotropes. Not interested in SNF or Hospice.  Creatinine 1.93>1.6>1.79-> 1.65-> 1.65> 1.63  Objective:   Weight Range:  Vital Signs:   Temp:  [97.4 F (36.3 C)-98 F (36.7 C)] 97.8 F (36.6 C) (10/11 0533) Pulse Rate:  [79-96] 79 (10/11 0533) Resp:  [18] 18 (10/11 0533) BP: (96-107)/(46-60) 97/60 mmHg (10/11 0533) SpO2:  [96 %-100 %] 98 % (10/11 0655) Weight:  [75.116 kg (165 lb 9.6 oz)] 75.116 kg (165 lb 9.6 oz) (10/11 0533) Last BM Date: 03/29/14  Weight change: Filed Weights   03/28/14 0600 03/29/14 0613 03/30/14 0533  Weight: 74.571 kg (164 lb 6.4 oz) 75.388 kg (166 lb 3.2 oz) 75.116 kg (165 lb 9.6 oz)    Intake/Output:   Intake/Output Summary (Last 24 hours) at 03/30/14 0942 Last data  filed at 03/30/14 0537  Gross per 24 hour  Intake 1430.73 ml  Output   2452 ml  Net -1021.27 ml     Physical Exam: General:  Elderly appearing. No resp difficulty, on 2L [place] Sitting in chair.  HEENT: normal Neck: supple. JVP 8-9 Carotids 2+ bilat; no bruits. No lymphadenopathy or thryomegaly appreciated.  Cor: PMI laterally displaced + S3 R upper chest PICC placed. Distant regular Lungs: Lungs clear. Abdomen: nontender, + distended. No hepatosplenomegaly. No bruits or masses. Good bowel sounds. Extremities: no cyanosis, clubbing, rash, R and LLE 2-3+ edema. Ulcer on L shin  5cm area of erythema on dorsum of left foot Neuro: alert & orientedx3, cranial nerves grossly intact. moves all 4 extremities w/o difficulty. Affect pleasant  Telemetry: V paced 90s  Labs: Basic Metabolic Panel:  Recent Labs Lab 03/26/14 0500 03/27/14 0529 03/28/14 0430 03/29/14 0410 03/30/14 0500  NA 141 141 144 138 140  K 3.5* 3.6* 3.9 4.0 3.6*  CL 92* 93* 93* 91* 89*  CO2 39* 37* 39* 36* 35*  GLUCOSE 127* 115* 123* 201* 191*  BUN 49* 47* 38* 36* 38*  CREATININE 1.93* 1.79* 1.65* 1.65* 1.63*  CALCIUM 8.8 8.6 8.8 8.7 8.7    Liver Function Tests: No results found for this basename: AST, ALT, ALKPHOS, BILITOT, PROT, ALBUMIN,  in the last 168 hours No results found for this basename: LIPASE, AMYLASE,  in the last 168 hours No results found for this basename: AMMONIA,  in the last 168  hours  CBC:  Recent Labs Lab 03/30/14 0500  WBC 14.2*  HGB 10.1*  HCT 31.5*  MCV 97.8  PLT 312    Cardiac Enzymes: No results found for this basename: CKTOTAL, CKMB, CKMBINDEX, TROPONINI,  in the last 168 hours  BNP: BNP (last 3 results)  Recent Labs  11/07/13 0708 03/01/14 1959 03/08/14 2230  PROBNP 9356.0* 11880.0* 15334.0*     Other results:  EKG:   Imaging: No results found.   Medications:     Scheduled Medications: . acidophilus  2 capsule Oral Daily  . amiodarone  200 mg Oral BID   . atorvastatin  20 mg Oral q1800  . budesonide (PULMICORT) nebulizer solution  0.25 mg Nebulization BID  .  ceFAZolin (ANCEF) IV  2 g Intravenous Q12H  . feeding supplement (ENSURE COMPLETE)  237 mL Oral TID BM  . furosemide  120 mg Intravenous BID  . loratadine  10 mg Oral Daily  . magnesium oxide  400 mg Oral Daily  . metolazone  5 mg Oral BID  . potassium chloride SA  40 mEq Oral BID  . sildenafil  20 mg Oral TID  . sodium chloride  10-40 mL Intracatheter Q12H  . sodium chloride  3 mL Intravenous Q12H  . Warfarin - Pharmacist Dosing Inpatient   Does not apply q1800    Infusions: . sodium chloride 10 mL/hr at 03/29/14 1656  . 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 Eads is well known to the HF team and was just admitted for AKI and abdominal distention. He went home on 9/18 and called the HF clinic last week d/t PICC being out 7 cm. Underwent exchange 9/24 and subsequently presented to the ED on 9/27 with fevers and erythema around PICC line. Bcx 2/2 MSSA. He also has PNA noted on CT.   Bcx 2/2 for MSSA. ID has seen. Infectious symptoms improving.  Has  power picc in RIJ line. Continue abx per ID (thanks). He finishes 10/12.  Still with significant edema despite increased lasix and metolazone. Will continue at higher doses. Renal function stable. Compression hose removed due to tissue injury on LLE/foot. Will follow closely. Consider coban wraps in next  day or two.  INR 2.1 Pharmacy adjusting (appreciate their assistance)  [facility] to resume care for home milrinone. Has follow up in HF clinic 10/13 9:45 (will need to change given delayed d/c)  Will discuss case with Rosanne Sack to see if she can help with Palliative Services.    Glori Bickers MD 03/30/2014 9:42 AM

## 2014-03-30 NOTE — Progress Notes (Signed)
[facility] PROGRESS NOTE Interim History: [name] is a 80 y.o. male w/ PMHx significant for NICM with EF of 20% on chronic milrinone, HTN, pulm HTN, chronic renal insufficiency (baseline CR 1.8), retroperitoneal mass and PAF.Presented to the ED 9/27 with fevers 103 and erythema around PICC. Concern for sepsis.PICC discontinued 9/27 d/t blood cultures Bcx 2/2 with MSSA. CXR showed possible HCAP, started on vanc and cefepime, then de-escalated to cefepime TTE negative ID recommended to cont Cefepime for 2 weeks. New picc line on 10.5.2015. He completed his HCAP treatment will de-escalte to ancef.  Filed Weights   03/28/14 0600 03/29/14 0613 03/30/14 0533  Weight: 74.571 kg (164 lb 6.4 oz) 75.388 kg (166 lb 3.2 oz) 75.116 kg (165 lb 9.6 oz)        Intake/Output Summary (Last 24 hours) at 03/30/14 1046 Last data filed at 03/30/14 0537  Gross per 24 hour  Intake 1430.73 ml  Output   2251 ml  Net -820.27 ml     Assessment/Plan: Severe sepsis due to MSSA bacteremia and HCAP: -Multifactorial secondary to MSSA bacteremia (due to PICC line) and probable HCAP. -Insert PICC line on 10.5.2015 -ensitive to cefepime MSSA, he has completed his HCAP antibiotic regimen will de-escalte to ancef. -ID consulted. TTE does not show valvular abnormality  -cont Ancef until 10.12.2015 and repeat blood cultures in 1 week after completing treatment.  Acute respiratory failure with hypoxia due to acute on chronic systolic heart failure: - had an episode of respiratory distress after central line on 10.6.2015. - Secondary to acute on chronic CHF exacerbation and  HCAP.  - Patient currently on milrinone, metolazone and furosemide. -still with elevated volume status; will increase lasix to 120mg  BID and follow volume; off 1 pound overnight  - Advance heart failure team on board; will follow rec's -continue to have good urine output  Nausea and emesis  -due to acute decompensated HF and sepsis, now  resolved -according to patient appetite better now that he is not vomiting  Acute on chronic kidney disease stage III  - Elevated creatinine was likely secondary  Heart failure. - Renal function has worsen. Received extra dose of lasix due to respiratory distress. -current Cr 1.65 (10/11), will monitor closley  Paroxysmal atrial fibrillation  -Currently rate controlled on amiodarone. INR is therapeutic cont dose per pharmacy.  pulmonary hypertension  -Continue sildenafil.   Severe protein caloric malnutrition: - cont ensure. -patient encourage to increase PO intake  Diarrhea  - C. difficile PCR negative. - WBC down from almost 30,000 on admission . -stool is more form now  Left leg skin lesion: due to use of stockings and severe swelling -will discontinue stockings -follow lesion and if needed will call wound care -keep legs elevated -might need coban wrap therapy  Code Status: full Family Communication: no family at bedside Disposition Plan: inpatient   Consultants:  Cardiology  ID  Procedures: ECHO: does not show valvular abnormality   Antibiotics:  Vanc 9.28.2015>>>9.30.2015  cefepime 9.28.2015>>03-25-14  Ancef until 03/31/14  HPI/Subjective: No fever. Denies CP. Still with fluid overload on exam (2-3++ LE edema and positive JVD); weight is down 1 pound  Objective: Filed Vitals:   03/29/14 2008 03/29/14 2206 03/30/14 0533 03/30/14 0655  BP:  101/49 97/60   Pulse:  96 79   Temp:  97.4 F (36.3 C) 97.8 F (36.6 C)   TempSrc:  Oral Oral   Resp:  18 18   Height:      Weight:  75.116 kg (165 lb 9.6 oz)   SpO2: 96% 98% 98% 98%     Exam:  General: Alert, awake, oriented x3, in no acute distress. Feeling better and breathing ok HEENT: +JVD; no bruits Heart: Regular rate and rhythm. 2-3++ edema Lungs: fair air movement, no wheezing; scattered rhonchi; no frank crackles seen Abdomen: Soft, nontender, distended abdomen positive bowel sounds.  . Skin: red lesion (affecting plantar aspect of his foot), also with blister on his left shin. Right foot with mild plantar redness. All of them to stocking socks and swelling   Data Reviewed: Basic Metabolic Panel:  Recent Labs Lab 03/26/14 0500 03/27/14 0529 03/28/14 0430 03/29/14 0410 03/30/14 0500  NA 141 141 144 138 140  K 3.5* 3.6* 3.9 4.0 3.6*  CL 92* 93* 93* 91* 89*  CO2 39* 37* 39* 36* 35*  GLUCOSE 127* 115* 123* 201* 191*  BUN 49* 47* 38* 36* 38*  CREATININE 1.93* 1.79* 1.65* 1.65* 1.63*  CALCIUM 8.8 8.6 8.8 8.7 8.7   CBC:  Recent Labs Lab 03/30/14 0500  WBC 14.2*  HGB 10.1*  HCT 31.5*  MCV 97.8  PLT 312   BNP (last 3 results)  Recent Labs  11/07/13 0708 03/01/14 1959 03/08/14 2230  PROBNP 9356.0* 11880.0* 15334.0*    Recent Results (from the past 240 hour(s))  CLOSTRIDIUM DIFFICILE BY PCR     Status: None   Collection Time    03/22/14  2:29 PM      Result Value Ref Range Status   C difficile by pcr NEGATIVE  NEGATIVE Final     Studies: No results found.  Scheduled Meds: . acidophilus  2 capsule Oral Daily  . amiodarone  200 mg Oral BID  . atorvastatin  20 mg Oral q1800  . budesonide (PULMICORT) nebulizer solution  0.25 mg Nebulization BID  .  ceFAZolin (ANCEF) IV  2 g Intravenous Q12H  . feeding supplement (ENSURE COMPLETE)  237 mL Oral TID BM  . furosemide  120 mg Intravenous BID  . loratadine  10 mg Oral Daily  . magnesium oxide  400 mg Oral Daily  . metolazone  5 mg Oral BID  . potassium chloride SA  40 mEq Oral BID  . sildenafil  20 mg Oral TID  . sodium chloride  10-40 mL Intracatheter Q12H  . sodium chloride  3 mL Intravenous Q12H  . Warfarin - Pharmacist Dosing Inpatient   Does not apply q1800   Continuous Infusions: . sodium chloride 10 mL/hr at 03/29/14 1656  . milrinone 0.375 mcg/kg/min (03/29/14 1312)    Time < 30 minutes  Barton Dubois  [facility] Pager [phone] If 8PM-8AM, please contact night-coverage  at [email or web address], password [facility] 03/30/2014, 10:46 AM  LOS: 14 days

## 2014-03-30 NOTE — Progress Notes (Signed)
ANTICOAGULATION CONSULT NOTE - Follow Up Consult  Pharmacy Consult for Coumadin  Indication: atrial fibrillation   Allergies  Allergen Reactions  . Lisinopril Rash    Patient Measurements: Height: 5\' 8"  (172.7 cm) Weight: 165 lb 9.6 oz (75.116 kg) IBW/kg (Calculated) : 68.4  Vital Signs: Temp: 96.9 F (36.1 C) (10/11 1201) Temp Source: Oral (10/11 1201) BP: 103/51 mmHg (10/11 1201) Pulse Rate: 82 (10/11 1201)  Labs:  Recent Labs  03/28/14 0430 03/29/14 0410 03/30/14 0500  HGB  --   --  10.1*  HCT  --   --  31.5*  PLT  --   --  312  LABPROT 24.8* 23.0* 23.6*  INR 2.24* 2.04* 2.10*  CREATININE 1.65* 1.65* 1.63*    Estimated Creatinine Clearance: 35.6 ml/min (by C-G formula based on Cr of 1.63).  Assessment: INR today = 2.10 in this 80yo male who continues on his chronic coumadin for h/o afib. Also continues on amiodarone (on PTA). INR was elevated on admit 9/27 and increased to 4.32 on 9/28. He received vitamin K 2.5mg  x 1 on 9/28. Coumadin resumed 9/29,  INR within goal 2-3 for >7 days on 2- 3 mg daily. No s/sx bleeding. CBC:  H/H low/stable and pltc wnl.. MD notes currently rate controlled on amiodarone.   Home dose: 4mg  daily except 2mg  Friday and Sunday - will need lower dose at d/c--has been therapeutic on 2-3mg  daily in hospital but INR decreased to low end of therapeutic range with mostly 2mg  doses.  Appears 3mg  daily may be best to keep INR 2-3.   Goal of Therapy:  INR 2-3 Monitor platelets by anticoagulation protocol: Yes   Plan:  1) Coumadin 3 mg daily 2) Daily INR  Nicole Cella, RPh Clinical Pharmacist Pager: [phone] 03/30/2014 1:53 PM

## 2014-03-31 DIAGNOSIS — I272 Other secondary pulmonary hypertension: Secondary | ICD-10-CM

## 2014-03-31 DIAGNOSIS — E785 Hyperlipidemia, unspecified: Secondary | ICD-10-CM

## 2014-03-31 LAB — BASIC METABOLIC PANEL
Anion gap: 12 (ref 5–15)
BUN: 39 mg/dL — AB (ref 6–23)
CALCIUM: 9.1 mg/dL (ref 8.4–10.5)
CO2: 39 mEq/L — ABNORMAL HIGH (ref 19–32)
Chloride: 90 mEq/L — ABNORMAL LOW (ref 96–112)
Creatinine, Ser: 1.62 mg/dL — ABNORMAL HIGH (ref 0.50–1.35)
GFR calc Af Amer: 45 mL/min — ABNORMAL LOW (ref >90)
GFR, EST NON AFRICAN AMERICAN: 39 mL/min — AB (ref >90)
Glucose, Bld: 127 mg/dL — ABNORMAL HIGH (ref 70–99)
Potassium: 3.4 mEq/L — ABNORMAL LOW (ref 3.7–5.3)
Sodium: 141 mEq/L (ref 137–147)

## 2014-03-31 LAB — PROTIME-INR
INR: 1.97 — ABNORMAL HIGH (ref 0.00–1.49)
Prothrombin Time: 22.4 seconds — ABNORMAL HIGH (ref 11.6–15.2)

## 2014-03-31 MED ORDER — POTASSIUM CHLORIDE CRYS ER 20 MEQ PO TBCR
20.0000 meq | EXTENDED_RELEASE_TABLET | Freq: Once | ORAL | Status: AC
  Administered 2014-03-31: 20 meq via ORAL

## 2014-03-31 MED ORDER — WARFARIN SODIUM 4 MG PO TABS
4.0000 mg | ORAL_TABLET | Freq: Once | ORAL | Status: AC
  Administered 2014-03-31: 4 mg via ORAL
  Filled 2014-03-31: qty 1

## 2014-03-31 MED ORDER — FUROSEMIDE 10 MG/ML IJ SOLN
80.0000 mg | Freq: Three times a day (TID) | INTRAMUSCULAR | Status: DC
  Administered 2014-03-31 – 2014-04-01 (×4): 80 mg via INTRAVENOUS
  Filled 2014-03-31 (×6): qty 8

## 2014-03-31 NOTE — Consult Note (Addendum)
WOC wound consult note Reason for Consult: Consult requested to assess redness to left foot and determine if compression wraps could be used. Wound type: Left calf with 4X3cm area of erythremia, no open wound or drainage.  Left foot with 4X4cm area with same appearance.  Generalized edema to legs.  Family members at bedside; they translate that patient did not have these areas prior to admission and he has not used leg wraps in the past.   Dressing procedure/placement/frequency: If Una boots or Profore wraps are desired for compression therapy to control edema, these sites would not be a barrier.  Please order if desired; ortho tech applies Una boots and pt would need follow-up after discharge; either in a clinic or from home health. Please re-consult if further assistance is needed.  Thank-you,  Julien Girt MSN, [place], [place], [place], [place]

## 2014-03-31 NOTE — Progress Notes (Signed)
Advanced Heart Failure Rounding Note   Subjective:    Tyler Christensen is a 80 y.o. male w/ PMHx significant for NICM with EF of 20% on chronic milrinone, HTN, pulm HTN, chronic renal insufficiency (baseline CR 1.8), retroperitoneal mass and PAF.  Just recently discharged 9/18 after AKI. Discharged weight was 160 lbs. Called last week about PICC line being out 7 cm and was scheduled for PICC exchange which he underwent (9/24).  Presented to the ED 9/27 with fevers 103 and erythema around PICC. Concern for sepsis and PICC exchanged. CT abdomen (9/28): increasing infiltration and atelectasis in the lung bases possible PNA. PICC discontinued 9/27. BCX MSSA x 2. Echo EF 20%. No obvious vegetation. Surveillance bcx remain negative.   Remains on milrinone 0.375 mcg. Yesterday he developed acute r respiratory distress after power picc place. Recived IV lasix, morphine, nitro, and xopnex. He improved back to his baseline.   Feels ok. No dyspnea. Compression stockings removed due to tissue injury on L foot. Remains on lasix and metolazone. 24 hr I/O -1.7 liters. No weight on chart.   Long discussion with him and his wife about prognosis and EOL issues. He wants to continue with aggressive care for now including inotropes. Not interested in SNF or Hospice.  Creatinine 1.93>1.6>1.79-> 1.65-> 1.65> 1.63>1.62  Objective:   Weight Range:  Vital Signs:   Temp:  [96.9 F (36.1 C)-98.6 F (37 C)] 98.2 F (36.8 C) (10/12 0515) Pulse Rate:  [51-90] 51 (10/12 0515) Resp:  [16-20] 18 (10/12 0515) BP: (91-109)/(36-51) 102/36 mmHg (10/12 0515) SpO2:  [93 %-99 %] 99 % (10/12 0743) Last BM Date: 03/29/14  Weight change: Filed Weights   03/28/14 0600 03/29/14 0613 03/30/14 0533  Weight: 164 lb 6.4 oz (74.571 kg) 166 lb 3.2 oz (75.388 kg) 165 lb 9.6 oz (75.116 kg)    Intake/Output:   Intake/Output Summary (Last 24 hours) at 03/31/14 0906 Last data filed at 03/31/14 1191  Gross per 24 hour  Intake 1123.9 ml   Output   3151 ml  Net -2027.1 ml     Physical Exam: General:  Elderly appearing. No resp difficulty, on 2L [place] Sitting in chair.  HEENT: normal Neck: supple. JVP 9 Carotids 2+ bilat; no bruits. No lymphadenopathy or thryomegaly appreciated.  Cor: PMI laterally displaced + S3 R upper chest PICC placed. Distant regular Lungs: Lungs clear. Abdomen: nontender, + distended. No hepatosplenomegaly. No bruits or masses. Good bowel sounds. Extremities: no cyanosis, clubbing, rash, R and LLE 2-3+ edema. Ulcer on L shin  5cm area of erythema on dorsum of left foot Neuro: alert & orientedx3, cranial nerves grossly intact. moves all 4 extremities w/o difficulty. Affect pleasant  Telemetry: V paced 90s  Labs: Basic Metabolic Panel:  Recent Labs Lab 03/27/14 0529 03/28/14 0430 03/29/14 0410 03/30/14 0500 03/31/14 0450  NA 141 144 138 140 141  K 3.6* 3.9 4.0 3.6* 3.4*  CL 93* 93* 91* 89* 90*  CO2 37* 39* 36* 35* 39*  GLUCOSE 115* 123* 201* 191* 127*  BUN 47* 38* 36* 38* 39*  CREATININE 1.79* 1.65* 1.65* 1.63* 1.62*  CALCIUM 8.6 8.8 8.7 8.7 9.1    Liver Function Tests: No results found for this basename: AST, ALT, ALKPHOS, BILITOT, PROT, ALBUMIN,  in the last 168 hours No results found for this basename: LIPASE, AMYLASE,  in the last 168 hours No results found for this basename: AMMONIA,  in the last 168 hours  CBC:  Recent Labs Lab 03/30/14 0500  WBC  14.2*  HGB 10.1*  HCT 31.5*  MCV 97.8  PLT 312    Cardiac Enzymes: No results found for this basename: CKTOTAL, CKMB, CKMBINDEX, TROPONINI,  in the last 168 hours  BNP: BNP (last 3 results)  Recent Labs  11/07/13 0708 03/01/14 1959 03/08/14 2230  PROBNP 9356.0* 11880.0* 15334.0*     Other results:  EKG:   Imaging: No results found.   Medications:     Scheduled Medications: . acidophilus  2 capsule Oral Daily  . amiodarone  200 mg Oral BID  . atorvastatin  20 mg Oral q1800  . budesonide (PULMICORT)  nebulizer solution  0.25 mg Nebulization BID  .  ceFAZolin (ANCEF) IV  2 g Intravenous Q12H  . feeding supplement (ENSURE COMPLETE)  237 mL Oral TID BM  . furosemide  120 mg Intravenous BID  . loratadine  10 mg Oral Daily  . magnesium oxide  400 mg Oral Daily  . metolazone  5 mg Oral BID  . potassium chloride SA  40 mEq Oral BID  . sildenafil  20 mg Oral TID  . sodium chloride  10-40 mL Intracatheter Q12H  . sodium chloride  3 mL Intravenous Q12H  . warfarin  3 mg Oral q1800  . Warfarin - Pharmacist Dosing Inpatient   Does not apply q1800    Infusions: . sodium chloride 10 mL/hr at 03/29/14 1656  . 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 is well known to the HF team and was just admitted for AKI and abdominal distention. He went home on 9/18 and called the HF clinic last week d/t PICC being out 7 cm. Underwent exchange 9/24 and subsequently presented to the ED on 9/27 with fevers and erythema around PICC line. Bcx 2/2 MSSA. He also has PNA noted on CT.   Bcx 2/2 for MSSA. ID has seen. Infectious symptoms improving.  Has  power picc in RIJ line. Continue abx per ID (thanks) last dose today.   Still with significant edema despite increased lasix and metolazone. Will increase lasix and continue metolazone. Supplement K+. Renal function stable. Compression hose removed due to tissue injury on LLE/foot. Consider coban wraps in next day or two.  INR 2.1  Pharmacy adjusting (appreciate their assistance)  [facility] to resume care for home milrinone. Very concerned that patient is not going to be able to remain at home comfortably. Dr. Haroldine Laws had talk with him and his wife about SNF/Hospice and he is not interested currently and wants to continue with aggressive care.   Junie Bame B NP-C 03/31/2014 9:06 AM  Patient seen and examined with Junie Bame, NP. We discussed all aspects of the encounter. I agree with the assessment and plan as stated above.   Edema improving more slowly than I expected. Will increase lasix to q8. Have Wound Care look at left foot. If OK we will place Coban wraps. Trying to get him home soon. INR ok. AF rate stable.   Deonte Otting,MD 9:30 AM

## 2014-03-31 NOTE — Progress Notes (Signed)
ANTICOAGULATION CONSULT NOTE - Follow Up Consult  Pharmacy Consult for Coumadin  Indication: atrial fibrillation   Allergies  Allergen Reactions  . Lisinopril Rash    Patient Measurements: Height: 5\' 8"  (172.7 cm) Weight: 164 lb 10.9 oz (74.7 kg) IBW/kg (Calculated) : 68.4  Vital Signs: Temp: 98.2 F (36.8 C) (10/12 0515) Temp Source: Oral (10/12 0515) BP: 102/36 mmHg (10/12 0515) Pulse Rate: 51 (10/12 0515)  Labs:  Recent Labs  03/29/14 0410 03/30/14 0500 03/31/14 0450  HGB  --  10.1*  --   HCT  --  31.5*  --   PLT  --  312  --   LABPROT 23.0* 23.6* 22.4*  INR 2.04* 2.10* 1.97*  CREATININE 1.65* 1.63* 1.62*    Estimated Creatinine Clearance: 35.8 ml/min (by C-G formula based on Cr of 1.62).  Assessment: 80yo male continues on chronic coumadin for h/o afib. Also continues on amiodarone (on PTA). INR was elevated on admit 9/27 and increased to 4.32 on 9/28. He received vitamin K 2.5mg  x 1 on 9/28. Coumadin resumed 9/29,  INR within goal for >7 days on 2- 3 mg daily, however, INR has dropped some today to 1.97.  Home dose: 4mg  daily except 2mg  Friday and Sunday   Goal of Therapy:  INR 2-3 Monitor platelets by anticoagulation protocol: Yes   Plan:  1) Increase coumadin to 4mg  x 1 2) INR in AM  Nena Jordan, PharmD, BCPS 03/31/2014 1:47 PM

## 2014-03-31 NOTE — Progress Notes (Signed)
Pt has been told not to get up without calling for help. Attempted to get up from recliner, but guided himself to the floor using his recliner for support. Did not seem to have hit his head.Full body assessment performed with out deviations noted.Neuro intact. Kathline Magic, NP notified @ [phone] with order for Neuro Q 4 hour x 48 hours. Pt remains off tele. Patient refuses to sleep in bed therefore chair alarm placed. Will notify family @ end of shift.

## 2014-03-31 NOTE — Progress Notes (Signed)
Physical Therapy Treatment Patient Details Name: Tyler Christensen MRN: [id number] DOB: [birth year] Today's Date: 03/31/2014    History of Present Illness Pt is a 80 y.o. 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 Patient states that he's unable to take anything because of N/V. Patient states his abdominal discomfort also has increased from prior with increasing abdominal distention. In the ER patient was found to be febrile with temperatures around 103F and was initially mildly hypotensive. Chest x-ray shows congestive pattern. Patient on exam has erythema around his right-sided upper extremity PICC line. PICC line was placed in September and patient states that since, it is erythema.    PT Comments    Pt indicates no pain after falling this am.  Pt able to ambulate with RW and MinG - S level of A throughout.  Pt's O2 sats 91% on 2L O2 after ambulating 200'.  Pt demos good awareness of safety this am by waiting for PT to attend to lines and requesting call button be taped to arm of recliner.  Will continue to follow.    Follow Up Recommendations  Home health PT;Supervision for mobility/OOB     Equipment Recommendations  Rolling walker with 5" wheels    Recommendations for Other Services       Precautions / Restrictions Precautions Precautions: Fall Precaution Comments: Requires interpreter  Restrictions Weight Bearing Restrictions: No    Mobility  Bed Mobility               General bed mobility comments: pt sitting in recliner.    Transfers Overall transfer level: Needs assistance Equipment used: Rolling walker (2 wheeled) Transfers: Sit to/from Stand Sit to Stand: Supervision         General transfer comment: pt demos good use of UEs and waits for PT to attend to lines.     Ambulation/Gait Ambulation/Gait assistance: Min guard Ambulation Distance (Feet): 200 Feet Assistive device: Rolling walker (2 wheeled) Gait Pattern/deviations: Step-through pattern;Decreased stride length;Trunk flexed     General Gait Details: pt moves slowly, but demos good use of RW.  pt on 2L O2 throughout mobility with sats 91% after amb.     Stairs            Wheelchair Mobility    Modified Rankin (Stroke Patients Only)       Balance                                    Cognition Arousal/Alertness: Awake/alert Behavior During Therapy: WFL for tasks assessed/performed Overall Cognitive Status: Within Functional Limits for tasks assessed                      Exercises      General Comments        Pertinent Vitals/Pain Pain Assessment: No/denies pain    Home Living                      Prior Function            PT Goals (current goals can now be found in the care plan section) Acute Rehab PT Goals Patient Stated Goal: To return home with his family.  PT Goal Formulation: With patient Time  For Goal Achievement: 04/04/14 Potential to Achieve Goals: Good Progress towards PT goals: Progressing toward goals    Frequency  Min 3X/week    PT Plan Current plan remains appropriate    Co-evaluation             End of Session Equipment Utilized During Treatment: Gait belt;Oxygen Activity Tolerance: Patient tolerated treatment well Patient left: in chair;with call bell/phone within reach;with chair alarm set     Time: 0755-0820 PT Time Calculation (min): 25 min  Charges:  $Gait Training: 23-37 mins                    G CodesCatarina Hartshorn, [place] 03/31/2014, 8:32 AM

## 2014-03-31 NOTE — Progress Notes (Signed)
[facility] PROGRESS NOTE Interim History: [name] is a 80 y.o. male w/ PMHx significant for NICM with EF of 20% on chronic milrinone, HTN, pulm HTN, chronic renal insufficiency (baseline CR 1.8), retroperitoneal mass and PAF.Presented to the ED 9/27 with fevers 103 and erythema around PICC. Concern for sepsis.PICC discontinued 9/27 d/t blood cultures Bcx 2/2 with MSSA. CXR showed possible HCAP, started on vanc and cefepime, then de-escalated to cefepime TTE negative ID recommended to cont Cefepime for 2 weeks. New picc line on 10.5.2015. He completed his HCAP treatment will de-escalte to ancef.  Filed Weights   03/29/14 0613 03/30/14 0533 03/31/14 0940  Weight: 75.388 kg (166 lb 3.2 oz) 75.116 kg (165 lb 9.6 oz) 74.7 kg (164 lb 10.9 oz)        Intake/Output Summary (Last 24 hours) at 03/31/14 1807 Last data filed at 03/31/14 1529  Gross per 24 hour  Intake 1723.9 ml  Output   3050 ml  Net -1326.1 ml     Assessment/Plan: Severe sepsis due to MSSA bacteremia and HCAP: -Multifactorial secondary to MSSA bacteremia (due to PICC line) and probable HCAP. -Inserted PICC line on 10.5.2015 -ensitive to cefepime MSSA, he has completed his HCAP antibiotic regimen will de-escalte to ancef. -ID consulted. TTE does not show valvular abnormality  -last day of Ancef and will repeat blood cultures in 1 week after completing treatment. -has remained afebrile  Acute respiratory failure with hypoxia due to acute on chronic systolic heart failure: -Secondary to acute on chronic CHF exacerbation and  HCAP.  -Patient currently on milrinone, metolazone and furosemide. -still with elevated volume status; will change lasix to 80mg  TID now, continue metolazone and follow volume;  -Advance heart failure team on board; will follow rec's -continue to have good urine output  Nausea and emesis  -due to acute decompensated HF and sepsis, now resolved -according to patient appetite better now that he  is not vomiting  Acute on chronic kidney disease stage III  - Elevated creatinine was likely secondary  Heart failure. - Renal function has worsen. Received extra dose of lasix due to respiratory distress. -current Cr 1.63 (10/12), will monitor closley  Paroxysmal atrial fibrillation  -Currently rate controlled on amiodarone. INR is therapeutic cont dose per pharmacy.  pulmonary hypertension  -Continue sildenafil.   Severe protein caloric malnutrition: - cont ensure. -patient encourage to increase PO intake  Diarrhea  - C. difficile PCR negative. - WBC down from almost 30,000 on admission . -stool is more form now  Left leg skin lesion: due to use of stockings and severe swelling -will discontinue stockings -follow lesion and if needed will call wound care -keep legs elevated -might need coban wrap therapy -will follow wound care  Code Status: full Family Communication: no family at bedside Disposition Plan: inpatient   Consultants:  Cardiology  ID  Procedures: ECHO: does not show valvular abnormality   Antibiotics:  Vanc 9.28.2015>>>9.30.2015  cefepime 9.28.2015>>03-25-14  Ancef until 03/31/14  HPI/Subjective: No fever. Denies CP. Still with fluid overload on exam (2-3++ LE edema and positive JVD)  Objective: Filed Vitals:   03/31/14 0515 03/31/14 0743 03/31/14 0940 03/31/14 1506  BP: 102/36   97/71  Pulse: 51   84  Temp: 98.2 F (36.8 C)   98 F (36.7 C)  TempSrc: Oral   Oral  Resp: 18   20  Height:      Weight:   74.7 kg (164 lb 10.9 oz)   SpO2: 95% 99%  99%     Exam:  General: Alert, awake, oriented x3, in no acute distress. Feeling better and breathing ok HEENT: +JVD; no bruits Heart: Regular rate and rhythm. 2-3++ edema Lungs: fair air movement, no wheezing; scattered rhonchi; no frank crackles seen Abdomen: Soft, nontender, distended abdomen positive bowel sounds. . Skin: red lesion (affecting plantar aspect of his foot), also with  blister on his left shin. Right foot with mild plantar redness. All of them to stocking socks and swelling   Data Reviewed: Basic Metabolic Panel:  Recent Labs Lab 03/27/14 0529 03/28/14 0430 03/29/14 0410 03/30/14 0500 03/31/14 0450  NA 141 144 138 140 141  K 3.6* 3.9 4.0 3.6* 3.4*  CL 93* 93* 91* 89* 90*  CO2 37* 39* 36* 35* 39*  GLUCOSE 115* 123* 201* 191* 127*  BUN 47* 38* 36* 38* 39*  CREATININE 1.79* 1.65* 1.65* 1.63* 1.62*  CALCIUM 8.6 8.8 8.7 8.7 9.1   CBC:  Recent Labs Lab 03/30/14 0500  WBC 14.2*  HGB 10.1*  HCT 31.5*  MCV 97.8  PLT 312   BNP (last 3 results)  Recent Labs  11/07/13 0708 03/01/14 1959 03/08/14 2230  PROBNP 9356.0* 11880.0* 15334.0*    Recent Results (from the past 240 hour(s))  CLOSTRIDIUM DIFFICILE BY PCR     Status: None   Collection Time    03/22/14  2:29 PM      Result Value Ref Range Status   C difficile by pcr NEGATIVE  NEGATIVE Final     Studies: No results found.  Scheduled Meds: . acidophilus  2 capsule Oral Daily  . amiodarone  200 mg Oral BID  . atorvastatin  20 mg Oral q1800  . budesonide (PULMICORT) nebulizer solution  0.25 mg Nebulization BID  .  ceFAZolin (ANCEF) IV  2 g Intravenous Q12H  . feeding supplement (ENSURE COMPLETE)  237 mL Oral TID BM  . furosemide  80 mg Intravenous TID PC  . loratadine  10 mg Oral Daily  . magnesium oxide  400 mg Oral Daily  . metolazone  5 mg Oral BID  . potassium chloride SA  40 mEq Oral BID  . sildenafil  20 mg Oral TID  . sodium chloride  10-40 mL Intracatheter Q12H  . sodium chloride  3 mL Intravenous Q12H  . warfarin  4 mg Oral ONCE-1800  . Warfarin - Pharmacist Dosing Inpatient   Does not apply q1800   Continuous Infusions: . sodium chloride 10 mL/hr at 03/29/14 1656  . milrinone 0.375 mcg/kg/min (03/31/14 0530)    Time < 30 minutes  Barton Dubois  [facility] Pager [phone] If 8PM-8AM, please contact night-coverage at [email or web address], password  [facility] 03/31/2014, 6:07 PM  LOS: 15 days

## 2014-04-01 ENCOUNTER — Inpatient Hospital Stay (HOSPITAL_COMMUNITY): Payer: Medicare Other

## 2014-04-01 DIAGNOSIS — Z7189 Other specified counseling: Secondary | ICD-10-CM

## 2014-04-01 LAB — PROTIME-INR
INR: 1.84 — ABNORMAL HIGH (ref 0.00–1.49)
PROTHROMBIN TIME: 21.3 s — AB (ref 11.6–15.2)

## 2014-04-01 LAB — BASIC METABOLIC PANEL
ANION GAP: 11 (ref 5–15)
BUN: 40 mg/dL — AB (ref 6–23)
CHLORIDE: 92 meq/L — AB (ref 96–112)
CO2: 39 mEq/L — ABNORMAL HIGH (ref 19–32)
CREATININE: 1.56 mg/dL — AB (ref 0.50–1.35)
Calcium: 9 mg/dL (ref 8.4–10.5)
GFR calc non Af Amer: 41 mL/min — ABNORMAL LOW (ref >90)
GFR, EST AFRICAN AMERICAN: 47 mL/min — AB (ref >90)
Glucose, Bld: 122 mg/dL — ABNORMAL HIGH (ref 70–99)
Potassium: 3.9 mEq/L (ref 3.7–5.3)
Sodium: 142 mEq/L (ref 137–147)

## 2014-04-01 MED ORDER — TORSEMIDE 20 MG PO TABS
40.0000 mg | ORAL_TABLET | Freq: Two times a day (BID) | ORAL | Status: DC
  Administered 2014-04-01: 40 mg via ORAL
  Filled 2014-04-01 (×4): qty 2

## 2014-04-01 MED ORDER — METOLAZONE 2.5 MG PO TABS
2.5000 mg | ORAL_TABLET | Freq: Every day | ORAL | Status: DC
  Administered 2014-04-01: 2.5 mg via ORAL
  Filled 2014-04-01 (×2): qty 1

## 2014-04-01 MED ORDER — WARFARIN SODIUM 5 MG PO TABS
5.0000 mg | ORAL_TABLET | Freq: Once | ORAL | Status: AC
  Administered 2014-04-01: 5 mg via ORAL
  Filled 2014-04-01: qty 1

## 2014-04-01 NOTE — Progress Notes (Addendum)
Advanced Heart Failure Rounding Note   Subjective:    [name] is a 80 y.o. male w/ PMHx significant for NICM with EF of 20% on chronic milrinone, HTN, pulm HTN, chronic renal insufficiency (baseline CR 1.8), retroperitoneal mass and PAF.  Just recently discharged 9/18 after AKI. Discharged weight was 160 lbs. Called last week about PICC line being out 7 cm and was scheduled for PICC exchange which he underwent (9/24).  Presented to the ED 9/27 with fevers 103 and erythema around PICC. Concern for sepsis and PICC exchanged. CT abdomen (9/28): increasing infiltration and atelectasis in the lung bases possible PNA. PICC discontinued 9/27. BCX MSSA x 2. Echo EF 20%. No obvious vegetation. Surveillance bcx remain negative.   Remains on milrinone 0.375 mcg.  Feels ok. No dyspnea. Compression stockings removed due to tissue injury on L foot. Seen by Wound Care. Ok with UNA boots.   Lasix dose increased yesterday. Weight unchanged. [name] reported at 79 this am but now back in mid 90s.    Creatinine 1.93>1.6>1.79-> 1.65-> 1.65> 1.63>1.62>1.56  Objective:   Weight Range:  Vital Signs:   Temp:  [98 F (36.7 C)-98.2 F (36.8 C)] 98 F (36.7 C) (10/13 0545) Pulse Rate:  [82-86] 86 (10/13 0840) Resp:  [18-20] 18 (10/13 0840) BP: (79-106)/(41-77) 106/77 mmHg (10/13 0840) SpO2:  [99 %-100 %] 99 % (10/13 0840) Weight:  [74.7 kg (164 lb 10.9 oz)-74.798 kg (164 lb 14.4 oz)] 74.798 kg (164 lb 14.4 oz) (10/13 0545) Last BM Date: 03/31/14  Weight change: Filed Weights   03/30/14 0533 03/31/14 0940 04/01/14 0545  Weight: 75.116 kg (165 lb 9.6 oz) 74.7 kg (164 lb 10.9 oz) 74.798 kg (164 lb 14.4 oz)    Intake/Output:   Intake/Output Summary (Last 24 hours) at 04/01/14 0843 Last data filed at 04/01/14 0830  Gross per 24 hour  Intake   1683 ml  Output   2200 ml  Net   -517 ml     Physical Exam: General:  Elderly appearing. No resp difficulty, on 2L Chauncey Sitting in chair.  HEENT: normal Neck:  supple. JVP 9 Carotids 2+ bilat; no bruits. No lymphadenopathy or thryomegaly appreciated.  Cor: PMI laterally displaced + S3 R upper chest PICC placed. Distant regular Lungs: Lungs clear. Abdomen: nontender, + distended. No hepatosplenomegaly. No bruits or masses. Good bowel sounds. Extremities: no cyanosis, clubbing, rash, R and LLE 2+ edema. Ulcer on L shin  5cm area of erythema on dorsum of left foot Neuro: alert & orientedx3, cranial nerves grossly intact. moves all 4 extremities w/o difficulty. Affect pleasant  Telemetry: V paced 90s  Labs: Basic Metabolic Panel:  Recent Labs Lab 03/28/14 0430 03/29/14 0410 03/30/14 0500 03/31/14 0450 04/01/14 0425  NA 144 138 140 141 142  K 3.9 4.0 3.6* 3.4* 3.9  CL 93* 91* 89* 90* 92*  CO2 39* 36* 35* 39* 39*  GLUCOSE 123* 201* 191* 127* 122*  BUN 38* 36* 38* 39* 40*  CREATININE 1.65* 1.65* 1.63* 1.62* 1.56*  CALCIUM 8.8 8.7 8.7 9.1 9.0    Liver Function Tests: No results found for this basename: AST, ALT, ALKPHOS, BILITOT, PROT, ALBUMIN,  in the last 168 hours No results found for this basename: LIPASE, AMYLASE,  in the last 168 hours No results found for this basename: AMMONIA,  in the last 168 hours  CBC:  Recent Labs Lab 03/30/14 0500  WBC 14.2*  HGB 10.1*  HCT 31.5*  MCV 97.8  PLT 312  Cardiac Enzymes: No results found for this basename: CKTOTAL, CKMB, CKMBINDEX, TROPONINI,  in the last 168 hours  BNP: BNP (last 3 results)  Recent Labs  11/07/13 0708 03/01/14 1959 03/08/14 2230  PROBNP 9356.0* 11880.0* 15334.0*     Other results:  EKG:   Imaging: No results found.   Medications:     Scheduled Medications: . acidophilus  2 capsule Oral Daily  . amiodarone  200 mg Oral BID  . atorvastatin  20 mg Oral q1800  . budesonide (PULMICORT) nebulizer solution  0.25 mg Nebulization BID  .  ceFAZolin (ANCEF) IV  2 g Intravenous Q12H  . feeding supplement (ENSURE COMPLETE)  237 mL Oral TID BM  .  furosemide  80 mg Intravenous TID PC  . loratadine  10 mg Oral Daily  . magnesium oxide  400 mg Oral Daily  . metolazone  5 mg Oral BID  . potassium chloride SA  40 mEq Oral BID  . sildenafil  20 mg Oral TID  . sodium chloride  10-40 mL Intracatheter Q12H  . sodium chloride  3 mL Intravenous Q12H  . Warfarin - Pharmacist Dosing Inpatient   Does not apply q1800    Infusions: . sodium chloride 10 mL/hr at 03/31/14 2257  . 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 is well known to the HF team and was just admitted for AKI and abdominal distention. He went home on 9/18 and called the HF clinic last week d/t PICC being out 7 cm. Underwent exchange 9/24 and subsequently presented to the ED on 9/27 with fevers and erythema around PICC line. Bcx 2/2 MSSA. He also has PNA noted on CT.   Bcx 2/2 for MSSA. ID has seen. Finished abx 10/12 Has  power picc in RIJ line.  Still with significant edema despite increased lasix and metolazone.  Not sure there is much more we can do to promote diuresis. Place UNA boots. Will change to po demadex 40 po bid and see how he responds. Continue metolazone 2.5 daily. Hopefully we can find a regimen that we can d/c him home on soon.   INR 1.8 Pharmacy adjusting (appreciate their assistance)  [facility] to resume care for home milrinone. Very concerned that patient is not going to be able to remain at home comfortably. Dr. Haroldine Laws had talk  with him and his wife about SNF/Hospice and he is not interested currently and wants to continue with aggressive care.   Glori Bickers MD  04/01/2014 8:43 AM

## 2014-04-01 NOTE — Progress Notes (Signed)
ANTICOAGULATION CONSULT NOTE - Follow Up Consult  Pharmacy Consult for Coumadin  Indication: atrial fibrillation   Allergies  Allergen Reactions  . Lisinopril Rash    Patient Measurements: Height: 5\' 8"  (172.7 cm) Weight: 164 lb 14.4 oz (74.798 kg) (scale C) IBW/kg (Calculated) : 68.4  Vital Signs: Temp: 98 F (36.7 C) (10/13 0545) Temp Source: Oral (10/13 0545) BP: 106/77 mmHg (10/13 0840) Pulse Rate: 86 (10/13 0840)  Labs:  Recent Labs  03/30/14 0500 03/31/14 0450 04/01/14 0425  HGB 10.1*  --   --   HCT 31.5*  --   --   PLT 312  --   --   LABPROT 23.6* 22.4* 21.3*  INR 2.10* 1.97* 1.84*  CREATININE 1.63* 1.62* 1.56*    Estimated Creatinine Clearance: 37.1 ml/min (by C-G formula based on Cr of 1.56).  Assessment: 80yo male continues on chronic coumadin for h/o afib. Also continues on amiodarone (on PTA). INR was elevated on admit 9/27 and increased to 4.32 on 9/28. He received vitamin K 2.5mg  x 1 on 9/28. Coumadin resumed 9/29,  INR within goal for >7 days on 2- 3 mg daily, however, INR has dropped over last several days. Renal function is improving and diuretics being changed to po torsemide  Home dose: 4mg  daily except 2mg  Friday and Sunday   Goal of Therapy:  INR 2-3 Monitor platelets by anticoagulation protocol: Yes   Plan:  1) Increase coumadin to 5mg  x 1 2) INR in AM  Nena Jordan, PharmD, BCPS 04/01/2014 10:59 AM

## 2014-04-01 NOTE — Progress Notes (Signed)
NUTRITION FOLLOW-UP  INTERVENTION: Continue Ensure Complete/Ensure Pudding po TID  NUTRITION DIAGNOSIS: Inadequate oral intake related to heart failure as evidenced by reported intake less than estimated needs; ongoing.   Goal: Pt to meet >/= 90% of their estimated nutrition needs; being met  Monitor:  Weight trend, po intake, acceptance of supplements, labs  ASSESSMENT: 80 y.o. 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  Per nursing notes pt has been eating 75 to 100% of most meals the past few days. Per RN, pt has been accepting Ensure supplements TID but, pt prefers chocolate so, if chocolate Ensure Complete is unavailable pt is being given chocolate Ensure Pudding; pt has been very compliant. RN reports pt is eating fairly well.  Pt's weight is stable.  -9.6 L fluid balance since admission Labs: BUN and Creatinine are elevated. Decreased GFR.    Height: Ht Readings from Last 1 Encounters:  03/17/14 $RemoveB'5\' 8"'[id number]$  (1.727 m)    Weight: Wt Readings from Last 1 Encounters:  04/01/14 164 lb 14.4 oz (74.798 kg)  03/28/14 164 lb 03/07/14 160 lb  BMI:  Body mass index is 25.08 kg/(m^2).  Estimated Nutritional Needs: Kcal: 1850-2100 Protein: 90-100 g Fluid: 1.9-2.1 L/day  Skin: open wound on left leg; +2 RLE and LLE edema  Diet Order: Sodium Restricted Meal Completion: 75-100%   Intake/Output Summary (Last 24 hours) at 04/01/14 1519 Last data filed at 04/01/14 1351  Gross per 24 hour  Intake    963 ml  Output   1800 ml  Net   -837 ml    Last BM: 10/13  Labs:   Recent Labs Lab 03/30/14 0500 03/31/14 0450 04/01/14 0425  NA 140 141 142  K 3.6* 3.4* 3.9  CL 89* 90* 92*  CO2 35* 39* 39*  BUN 38* 39* 40*  CREATININE 1.63* 1.62* 1.56*  CALCIUM 8.7 9.1 9.0  GLUCOSE  191* 127* 122*    CBG (last 3)  No results found for this basename: GLUCAP,  in the last 72 hours  Scheduled Meds: . acidophilus  2 capsule Oral Daily  . amiodarone  200 mg Oral BID  . atorvastatin  20 mg Oral q1800  . budesonide (PULMICORT) nebulizer solution  0.25 mg Nebulization BID  . feeding supplement (ENSURE COMPLETE)  237 mL Oral TID BM  . loratadine  10 mg Oral Daily  . magnesium oxide  400 mg Oral Daily  . metolazone  2.5 mg Oral Daily  . potassium chloride SA  40 mEq Oral BID  . sildenafil  20 mg Oral TID  . sodium chloride  10-40 mL Intracatheter Q12H  . sodium chloride  3 mL Intravenous Q12H  . torsemide  40 mg Oral BID  . warfarin  5 mg Oral ONCE-1800  . Warfarin - Pharmacist Dosing Inpatient   Does not apply q1800    Continuous Infusions: . sodium chloride 10 mL/hr at 03/31/14 2257  . milrinone 0.375 mcg/kg/min (04/01/14 0727)    Pryor Ochoa RD, LDN Inpatient Clinical Dietitian Pager: [phone] After Hours Pager: [phone]

## 2014-04-01 NOTE — Progress Notes (Signed)
[facility] PROGRESS NOTE Interim History: Tyler Christensen is a 80 y.o. male w/ PMHx significant for NICM with EF of 20% on chronic milrinone, HTN, pulm HTN, chronic renal insufficiency (baseline CR 1.8), retroperitoneal mass and PAF.Presented to the ED 9/27 with fevers 103 and erythema around PICC. Concern for sepsis.PICC discontinued 9/27 d/t blood cultures Bcx 2/2 with MSSA. CXR showed possible HCAP, started on vanc and cefepime, then de-escalated to cefepime TTE negative ID recommended to cont Cefepime for 2 weeks. New picc line on 10.5.2015. He completed his HCAP treatment and MSSA bacteremia treatment. Adjusting regimen to control volume before he is discharge. HF team on board will follow recommendation.  Filed Weights   03/30/14 0533 03/31/14 0940 04/01/14 0545  Weight: 75.116 kg (165 lb 9.6 oz) 74.7 kg (164 lb 10.9 oz) 74.798 kg (164 lb 14.4 oz)        Intake/Output Summary (Last 24 hours) at 04/01/14 1320 Last data filed at 04/01/14 0830  Gross per 24 hour  Intake   1083 ml  Output   2050 ml  Net   -967 ml     Assessment/Plan: Severe sepsis due to MSSA bacteremia and HCAP: -Multifactorial secondary to MSSA bacteremia (due to PICC line) and probable HCAP. -Re-Inserted PICC line on 10.5.2015 -ID consulted. TTE does not show valvular abnormality  -has now completed antibiotic regimen for HCAP and MSSA bacteremia on 10/12; needs repeat blood cultures in 1 week (04/07/14) -has remained afebrile  Acute respiratory failure with hypoxia due to acute on chronic systolic heart failure: -Secondary to acute on chronic CHF exacerbation and  HCAP.  -Patient currently on milrinone, metolazone and furosemide. -still with elevated volume status; will add unna boots, start demadex 40mg  BID and continue metolazone daily  -follow volume and hopefully home soon  -Advance heart failure team on board; will follow rec's  Nausea and emesis  -due to acute decompensated HF and sepsis, now  resolved -according to patient appetite better now that he is not vomiting  Acute on chronic kidney disease stage III  -Elevated creatinine was likely secondary Heart failure. -Renal function has worsen. Received extra dose of lasix due to respiratory distress. -current Cr 1.56 (10/13), will monitor closley  Paroxysmal atrial fibrillation  -Currently rate controlled on amiodarone. INR is therapeutic cont dose per pharmacy.  pulmonary hypertension  -Continue sildenafil.   Severe protein caloric malnutrition: - cont ensure. -patient encourage to increase PO intake  Diarrhea  - C. difficile PCR negative. - WBC down from almost 30,000 on admission . -stool is more form now  Left leg skin lesion: due to use of stockings and severe swelling -stocking hoses discontinued; will now apply UNA Boots -no open skin or drainage -keep legs elevated  Code Status: full Family Communication: no family at bedside Disposition Plan: inpatient   Consultants:  Cardiology  ID  Procedures: ECHO: does not show valvular abnormality   Antibiotics:  Vanc 9.28.2015>>>9.30.2015  cefepime 9.28.2015>>03-25-14  Ancef until 03/31/14  HPI/Subjective: No fever. Denies CP. Still with fluid overload on exam (2-3++ LE edema and positive JVD)  Objective: Filed Vitals:   04/01/14 0129 04/01/14 0140 04/01/14 0545 04/01/14 0840  BP: 89/70 89/45 79/54  106/77  Pulse: 83  84 86  Temp: 98.2 F (36.8 C)  98 F (36.7 C)   TempSrc: Oral  Oral   Resp: 18  18 18   Height:      Weight:   74.798 kg (164 lb 14.4 oz)   SpO2: 99%  100%  99%     Exam:  General: Alert, awake, oriented x3, in no acute distress. Feeling better and breathing ok HEENT: +JVD; no bruits Heart: Regular rate and rhythm. 2-3++ edema Lungs: fair air movement, no wheezing; scattered rhonchi; no frank crackles seen Abdomen: Soft, nontender, distended abdomen positive bowel sounds. . Skin: red lesion (affecting plantar aspect of his  foot), also with blister on his left shin. Right foot with mild plantar redness. All of them to stocking socks and swelling   Data Reviewed: Basic Metabolic Panel:  Recent Labs Lab 03/28/14 0430 03/29/14 0410 03/30/14 0500 03/31/14 0450 04/01/14 0425  NA 144 138 140 141 142  K 3.9 4.0 3.6* 3.4* 3.9  CL 93* 91* 89* 90* 92*  CO2 39* 36* 35* 39* 39*  GLUCOSE 123* 201* 191* 127* 122*  BUN 38* 36* 38* 39* 40*  CREATININE 1.65* 1.65* 1.63* 1.62* 1.56*  CALCIUM 8.8 8.7 8.7 9.1 9.0   CBC:  Recent Labs Lab 03/30/14 0500  WBC 14.2*  HGB 10.1*  HCT 31.5*  MCV 97.8  PLT 312   BNP (last 3 results)  Recent Labs  11/07/13 0708 03/01/14 1959 03/08/14 2230  PROBNP 9356.0* 11880.0* 15334.0*    Recent Results (from the past 240 hour(s))  CLOSTRIDIUM DIFFICILE BY PCR     Status: None   Collection Time    03/22/14  2:29 PM      Result Value Ref Range Status   C difficile by pcr NEGATIVE  NEGATIVE Final     Studies: No results found.  Scheduled Meds: . acidophilus  2 capsule Oral Daily  . amiodarone  200 mg Oral BID  . atorvastatin  20 mg Oral q1800  . budesonide (PULMICORT) nebulizer solution  0.25 mg Nebulization BID  . feeding supplement (ENSURE COMPLETE)  237 mL Oral TID BM  . loratadine  10 mg Oral Daily  . magnesium oxide  400 mg Oral Daily  . metolazone  2.5 mg Oral Daily  . potassium chloride SA  40 mEq Oral BID  . sildenafil  20 mg Oral TID  . sodium chloride  10-40 mL Intracatheter Q12H  . sodium chloride  3 mL Intravenous Q12H  . torsemide  40 mg Oral BID  . warfarin  5 mg Oral ONCE-1800  . Warfarin - Pharmacist Dosing Inpatient   Does not apply q1800   Continuous Infusions: . sodium chloride 10 mL/hr at 03/31/14 2257  . milrinone 0.375 mcg/kg/min (04/01/14 0727)    Time < 30 minutes  Barton Dubois  [facility] Pager [phone] If 8PM-8AM, please contact night-coverage at [email or web address], password [facility] 04/01/2014, 1:20 PM  LOS: 16 days

## 2014-04-01 NOTE — Progress Notes (Addendum)
Paged cardiologist about blood pressure of 79/54.  Will re-check and await call back.  Recheck at 0655 = 96/50

## 2014-04-01 NOTE — Progress Notes (Signed)
Orthopedic Tech Progress Note Patient Details:  Tyler Christensen Mar 03, 1933 [id number]  Ortho Devices Type of Ortho Device: Haematologist Ortho Device/Splint Location: (B) LE Ortho Device/Splint Interventions: Ordered;Application   Braulio Bosch 04/01/2014, 4:24 PM

## 2014-04-02 LAB — BASIC METABOLIC PANEL
ANION GAP: 11 (ref 5–15)
BUN: 41 mg/dL — ABNORMAL HIGH (ref 6–23)
CALCIUM: 9 mg/dL (ref 8.4–10.5)
CO2: 39 meq/L — AB (ref 19–32)
Chloride: 92 mEq/L — ABNORMAL LOW (ref 96–112)
Creatinine, Ser: 1.58 mg/dL — ABNORMAL HIGH (ref 0.50–1.35)
GFR calc Af Amer: 46 mL/min — ABNORMAL LOW (ref >90)
GFR, EST NON AFRICAN AMERICAN: 40 mL/min — AB (ref >90)
Glucose, Bld: 120 mg/dL — ABNORMAL HIGH (ref 70–99)
Potassium: 3.7 mEq/L (ref 3.7–5.3)
Sodium: 142 mEq/L (ref 137–147)

## 2014-04-02 LAB — PROTIME-INR
INR: 1.67 — AB (ref 0.00–1.49)
Prothrombin Time: 19.7 seconds — ABNORMAL HIGH (ref 11.6–15.2)

## 2014-04-02 MED ORDER — WARFARIN SODIUM 5 MG PO TABS
5.0000 mg | ORAL_TABLET | Freq: Once | ORAL | Status: AC
  Administered 2014-04-02: 5 mg via ORAL
  Filled 2014-04-02: qty 1

## 2014-04-02 MED ORDER — METOLAZONE 5 MG PO TABS
5.0000 mg | ORAL_TABLET | Freq: Every day | ORAL | Status: DC
  Administered 2014-04-02: 5 mg via ORAL
  Filled 2014-04-02 (×2): qty 1

## 2014-04-02 MED ORDER — FUROSEMIDE 10 MG/ML IJ SOLN
80.0000 mg | Freq: Two times a day (BID) | INTRAMUSCULAR | Status: DC
  Administered 2014-04-02 – 2014-04-03 (×3): 80 mg via INTRAVENOUS
  Filled 2014-04-02 (×4): qty 8

## 2014-04-02 NOTE — Progress Notes (Signed)
[id number] Heart Failure Rounding Note   Subjective:    Tyler Christensen is a 80 y.o. male w/ PMHx significant for NICM with EF of 20% on chronic milrinone, HTN, pulm HTN, chronic renal insufficiency (baseline CR 1.8), retroperitoneal mass and PAF.  Just recently discharged 9/18 after AKI. Discharged weight was 160 lbs. Called last week about PICC line being out 7 cm and was scheduled for PICC exchange which he underwent (9/24).  Presented to the ED 9/27 with fevers 103 and erythema around PICC. Concern for sepsis and PICC exchanged. CT abdomen (9/28): increasing infiltration and atelectasis in the lung bases possible PNA. PICC discontinued 9/27. BCX MSSA x 2. Echo EF 20%. No obvious vegetation. Surveillance bcx remain negative.   Remains on milrinone 0.375 mcg. Volume status remains elevated and weight up 4 lbs, 24 hr I/O -1.3 liters.  Denies abdominal pain or SOB. INR trending down.   Creatinine 1.58  Objective:   Weight Range:  Vital Signs:   Temp:  [98.3 F (36.8 C)-98.4 F (36.9 C)] 98.4 F (36.9 C) (10/14 0506) Pulse Rate:  [80-84] 84 (10/14 0506) Resp:  [18] 18 (10/14 0506) BP: (91-97)/(47-54) 97/54 mmHg (10/14 0506) SpO2:  [96 %-99 %] 96 % (10/14 0506) Weight:  [168 lb 4.8 oz (76.34 kg)] 168 lb 4.8 oz (76.34 kg) (10/14 0506) Last BM Date: 04/01/14  Weight change: Filed Weights   03/31/14 0940 04/01/14 0545 04/02/14 0506  Weight: 164 lb 10.9 oz (74.7 kg) 164 lb 14.4 oz (74.798 kg) 168 lb 4.8 oz (76.34 kg)    Intake/Output:   Intake/Output Summary (Last 24 hours) at 04/02/14 0908 Last data filed at 04/02/14 0507  Gross per 24 hour  Intake    480 ml  Output   2150 ml  Net  -1670 ml     Physical Exam: General:  Elderly appearing. No resp difficulty, on 2L [place] Sitting in chair.  HEENT: normal Neck: supple. JVP 9 Carotids 2+ bilat; no bruits. No lymphadenopathy or thryomegaly appreciated.  Cor: PMI laterally displaced + S3 R upper chest PICC placed. Distant  regular Lungs: Lungs clear. Abdomen: nontender, + distended. No hepatosplenomegaly. No bruits or masses. Good bowel sounds. Extremities: no cyanosis, clubbing, rash, R and LLE 2+ edema. Ulcer on L shin  5cm area of erythema on dorsum of left foot, UNNA boots intact Neuro: alert & orientedx3, cranial nerves grossly intact. moves all 4 extremities w/o difficulty. Affect pleasant  Telemetry: V paced 90s  Labs: Basic Metabolic Panel:  Recent Labs Lab 03/29/14 0410 03/30/14 0500 03/31/14 0450 04/01/14 0425 04/02/14 0601  NA 138 140 141 142 142  K 4.0 3.6* 3.4* 3.9 3.7  CL 91* 89* 90* 92* 92*  CO2 36* 35* 39* 39* 39*  GLUCOSE 201* 191* 127* 122* 120*  BUN 36* 38* 39* 40* 41*  CREATININE 1.65* 1.63* 1.62* 1.56* 1.58*  CALCIUM 8.7 8.7 9.1 9.0 9.0    Liver Function Tests: No results found for this basename: AST, ALT, ALKPHOS, BILITOT, PROT, ALBUMIN,  in the last 168 hours No results found for this basename: LIPASE, AMYLASE,  in the last 168 hours No results found for this basename: AMMONIA,  in the last 168 hours  CBC:  Recent Labs Lab 03/30/14 0500  WBC 14.2*  HGB 10.1*  HCT 31.5*  MCV 97.8  PLT 312    Cardiac Enzymes: No results found for this basename: CKTOTAL, CKMB, CKMBINDEX, TROPONINI,  in the last 168 hours  BNP: BNP (last 3 results)  Recent Labs  11/07/13 0708 03/01/14 1959 03/08/14 2230  PROBNP 9356.0* 11880.0* 15334.0*     Other results:  EKG:   Imaging: No results found.   Medications:     Scheduled Medications: . acidophilus  2 capsule Oral Daily  . amiodarone  200 mg Oral BID  . atorvastatin  20 mg Oral q1800  . budesonide (PULMICORT) nebulizer solution  0.25 mg Nebulization BID  . feeding supplement (ENSURE COMPLETE)  237 mL Oral TID BM  . loratadine  10 mg Oral Daily  . magnesium oxide  400 mg Oral Daily  . metolazone  2.5 mg Oral Daily  . potassium chloride SA  40 mEq Oral BID  . sildenafil  20 mg Oral TID  . sodium chloride   10-40 mL Intracatheter Q12H  . sodium chloride  3 mL Intravenous Q12H  . torsemide  40 mg Oral BID  . Warfarin - Pharmacist Dosing Inpatient   Does not apply q1800    Infusions: . sodium chloride 10 mL/hr at 03/31/14 2257  . 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 He went home on 9/18 and called the HF clinic last week d/t PICC being out 7 cm. Underwent exchange 9/24 and subsequently presented to the ED on 9/27 with fevers and erythema around PICC line. Bcx 2/2 MSSA. He also has PNA noted on CT.   Bcx 2/2 for MSSA. ID has seen. Finished abx 10/12 Has  power picc in RIJ line.  Still with significant edema. Will stop torsemide and give lasix 80 mg IV BID with metolazone 5 mg today. Not sure there is much more we can do to promote diuresis. Hopefully we can find a regimen that we can d/c him home on soon. He has been getting a lot of ensure and eating soup. Discussed fluid restriction and sodium restrictions.    INR 1.67 and has been trending down, likely to Vitamin K in Ensure. Will stop.   [facility] to resume care for home milrinone. Very concerned that patient is not going to be able to remain at home comfortably. Dr. Haroldine Laws had talk with him and his wife about SNF/Hospice and he is not interested currently and wants to continue with aggressive care.   Tyler Christensen  NP_C 04/02/2014 9:08 AM   Patient seen and examined with Tyler Bame, NP. We discussed all aspects of the encounter. I agree with the assessment and plan as stated above.   Agree with above. Volume status continues to be major issue. Long talk about need for volume restriction. Agree with IV lasix. Legs wrapped.   Daniel Bensimhon,MD 11:17 AM

## 2014-04-02 NOTE — Progress Notes (Signed)
Physical Therapy Treatment Patient Details Name: Johnell Bas MRN: [id number] DOB: 19-May-1933 Today's Date: 04/02/2014    History of Present Illness Pt is a 80 y.o. 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 Patient states that he's unable to take anything because of N/V. Patient states his abdominal discomfort also has increased from prior with increasing abdominal distention. In the ER patient was found to be febrile with temperatures around 103F and was initially mildly hypotensive. Chest x-ray shows congestive pattern. Patient on exam has erythema around his right-sided upper extremity PICC line. PICC line was placed in September and patient states that since, it is erythema.    PT Comments    Able to provide some basic education re: acceptable O2 sat levels with amb; continues to progress with gait steadiness; Higher supplemental O2 requirement with amb than last session  Follow Up Recommendations  Home health PT;Supervision for mobility/OOB     Equipment Recommendations  Rolling walker with 5" wheels    Recommendations for Other Services       Precautions / Restrictions Precautions Precautions: Fall Precaution Comments: Requires interpreter  Restrictions Weight Bearing Restrictions: No    Mobility  Bed Mobility               General bed mobility comments: pt sitting in recliner.    Transfers Overall transfer level: Needs assistance Equipment used: Rolling walker (2 wheeled) Transfers: Sit to/from Stand Sit to Stand: Supervision         General transfer comment: pt demos good use of UEs and waits for PT to attend to lines.    Ambulation/Gait Ambulation/Gait assistance: Min guard (without physical contact) Ambulation Distance (Feet): 200 Feet Assistive device:  Rolling walker (2 wheeled) Gait Pattern/deviations: Step-through pattern;Trunk flexed Gait velocity: Decreased   General Gait Details: pt moves slowly, but demos good use of RW.  pt on 4L O2 to maintain acceptable O2 sat levels throughout mobility with sats 91% after amb.     Stairs            Wheelchair Mobility    Modified Rankin (Stroke Patients Only)       Balance                                    Cognition Arousal/Alertness: Awake/alert Behavior During Therapy: WFL for tasks assessed/performed Overall Cognitive Status: Within Functional Limits for tasks assessed                      Exercises      General Comments        Pertinent Vitals/Pain Pain Assessment: No/denies pain    Home Living                      Prior Function            PT Goals (current goals can now be found in the care plan section) Acute Rehab PT Goals Patient Stated Goal: To return home with his family.  PT Goal Formulation: With patient Time For Goal Achievement: 04/04/14 Potential to Achieve Goals: Good Progress towards PT goals: Progressing toward goals    Frequency  Min 3X/week    PT Plan Current plan remains appropriate  Co-evaluation             End of Session Equipment Utilized During Treatment: Gait belt;Oxygen Activity Tolerance: Patient tolerated treatment well Patient left: in chair;with call bell/phone within reach;with chair alarm set     Time: 7353-2992 PT Time Calculation (min): 21 min  Charges:  $Gait Training: 8-22 mins                    G Codes:      Quin Hoop 04/02/2014, 5:02 PM  Roney Marion, [place] Pager [phone] Office [phone]

## 2014-04-02 NOTE — Progress Notes (Signed)
ANTICOAGULATION CONSULT NOTE - Follow Up Consult  Pharmacy Consult for Coumadin  Indication: atrial fibrillation   Allergies  Allergen Reactions  . Lisinopril Rash    Patient Measurements: Height: 5\' 8"  (172.7 cm) Weight: 168 lb 4.8 oz (76.34 kg) (scale C) IBW/kg (Calculated) : 68.4  Vital Signs: Temp: 98.4 F (36.9 C) (10/14 0506) Temp Source: Oral (10/14 0506) BP: 97/54 mmHg (10/14 0506) Pulse Rate: 84 (10/14 0506)  Labs:  Recent Labs  03/31/14 0450 04/01/14 0425 04/02/14 0601  LABPROT 22.4* 21.3* 19.7*  INR 1.97* 1.84* 1.67*  CREATININE 1.62* 1.56* 1.58*    Estimated Creatinine Clearance: 36.7 ml/min (by C-G formula based on Cr of 1.58).  Assessment: 80yo male continues on chronic coumadin for h/o afib. Also continues on amiodarone (on PTA). INR was elevated on admit 9/27 and increased to 4.32 on 9/28. He received vitamin K 2.5mg  x 1 on 9/28. Coumadin resumed 9/29,  INR within goal for >7 days on 2- 3 mg daily, however, INR has dropped over last several days. Most recently INR 1.84>>1.67 after 4 and 5mg  doses give. Patient not reporting diarrhea or eating dark greens, but was recently started on Ensure which may be contributing to drop in INR. Will give 5mg  today and d/c Ensure.    Home dose: 4mg  daily except 2mg  Friday and Sunday   Goal of Therapy:  INR 2-3 Monitor platelets by anticoagulation protocol: Yes   Plan:  1) Coumadin 5mg  x 1 2) INR in AM  Megan E. Supple, Pharm.D Clinical Pharmacy Resident Pager: [phone] 04/02/2014 9:22 AM

## 2014-04-02 NOTE — Progress Notes (Addendum)
[facility] PROGRESS NOTE Interim History: Tyler Christensen is a 80 y.o. male w/ PMHx significant for NICM with EF of 20% on chronic milrinone, HTN, pulm HTN, chronic renal insufficiency (baseline CR 1.8), liposarcoma  and PAF. Recently discharged 9/18 after AKI. Discharged weight was 160 lbs. Last week about PICC line being out 7 cm and was scheduled for PICC exchange which he underwent (9/24). Presented to the ED 9/27 with fevers 103 and erythema around PICC. Concern for sepsis and PICC exchanged. CT abdomen (9/28): increasing infiltration and atelectasis in the lung bases possible PNA. PICC discontinued 9/27. BCX MSSA x 2. Echo EF 20%. No obvious vegetation. Surveillance bcx remain negative. Completed course antibiotic in house 12.12.2015.Remains on milrinone 0.375 mcg. Volume status remains elevated and weight up 4 lbs, 24 hr I/O -1.3 liters.   Filed Weights   03/31/14 0940 04/01/14 0545 04/02/14 0506  Weight: 74.7 kg (164 lb 10.9 oz) 74.798 kg (164 lb 14.4 oz) 76.34 kg (168 lb 4.8 oz)        Intake/Output Summary (Last 24 hours) at 04/02/14 1118 Last data filed at 04/02/14 0948  Gross per 24 hour  Intake    720 ml  Output   2301 ml  Net  -1581 ml     Assessment/Plan: Severe sepsis due to MSSA bacteremia and HCAP:  -Multifactorial secondary to MSSA bacteremia (due to PICC line) and probable HCAP.  -Re-Inserted PICC line on 10.5.2015  -ID consulted. TTE does not show valvular abnormality  -has now completed antibiotic regimen for HCAP and MSSA bacteremia on 10/12; needs repeat blood cultures in 1 week (04/07/14)   Acute respiratory failure with hypoxia due to acute on chronic systolic heart failure:  - Secondary to acute on chronic CHF exacerbation and HCAP.  - Patient currently on milrinone, metolazone and furosemide.  - Still with elevated volume status; will add unna boots,on demadex 40mg  BID and  metolazone daily  - Advance heart failure team on board; will follow rec's    Nausea and emesis  - Due to acute decompensated HF and sepsis, now resolved  - According to patient appetite better now that he is not vomiting   Acute on chronic kidney disease stage III  - Elevated creatinine was likely secondary Heart failure, now improved - Current Cr 1.56 (10/13), will monitor closley   Paroxysmal atrial fibrillation  -Currently rate controlled on amiodarone.  - INR subtherapeutic.  pulmonary hypertension  - Continue sildenafil.   Severe protein caloric malnutrition:  - Cont ensure.  - Patient encourage to increase PO intake   Diarrhea  - C. difficile PCR negative.  - WBC down from almost 30,000 on admission .  -stool is more form now   Left leg skin lesion:  - Stocking hoses discontinued; will now apply UNA Boots  - No open skin or drainage  - Keep legs elevated  Code Status: full  Family Communication: wife  Disposition Plan: inpatient    Consultants:  ID  cardiology  Procedures: ECHO: does not show abnormality  Antibiotics:  HPI/Subjective: Feels better.  Objective: Filed Vitals:   04/01/14 0840 04/01/14 2106 04/01/14 2139 04/02/14 0506  BP: 106/77  91/47 97/54  Pulse: 86  80 84  Temp:   98.3 F (36.8 C) 98.4 F (36.9 C)  TempSrc:   Oral Oral  Resp: 18  18 18   Height:      Weight:    76.34 kg (168 lb 4.8 oz)  SpO2: 99% 99% 97% 96%  Exam:  General: Alert, awake, oriented x3, in no acute distress.  HEENT: No bruits, no goiter.  Heart: Regular rate and rhythm. Lungs: Good air movement, clear Abdomen: Soft, nontender, distended, positive bowel sounds.    Data Reviewed: Basic Metabolic Panel:  Recent Labs Lab 03/29/14 0410 03/30/14 0500 03/31/14 0450 04/01/14 0425 04/02/14 0601  NA 138 140 141 142 142  K 4.0 3.6* 3.4* 3.9 3.7  CL 91* 89* 90* 92* 92*  CO2 36* 35* 39* 39* 39*  GLUCOSE 201* 191* 127* 122* 120*  BUN 36* 38* 39* 40* 41*  CREATININE 1.65* 1.63* 1.62* 1.56* 1.58*  CALCIUM 8.7 8.7 9.1 9.0  9.0   Liver Function Tests: No results found for this basename: AST, ALT, ALKPHOS, BILITOT, PROT, ALBUMIN,  in the last 168 hours No results found for this basename: LIPASE, AMYLASE,  in the last 168 hours No results found for this basename: AMMONIA,  in the last 168 hours CBC:  Recent Labs Lab 03/30/14 0500  WBC 14.2*  HGB 10.1*  HCT 31.5*  MCV 97.8  PLT 312   Cardiac Enzymes: No results found for this basename: CKTOTAL, CKMB, CKMBINDEX, TROPONINI,  in the last 168 hours BNP (last 3 results)  Recent Labs  11/07/13 0708 03/01/14 1959 03/08/14 2230  PROBNP 9356.0* 11880.0* 15334.0*   CBG: No results found for this basename: GLUCAP,  in the last 168 hours  No results found for this or any previous visit (from the past 240 hour(s)).   Studies: No results found.  Scheduled Meds: . acidophilus  2 capsule Oral Daily  . amiodarone  200 mg Oral BID  . atorvastatin  20 mg Oral q1800  . budesonide (PULMICORT) nebulizer solution  0.25 mg Nebulization BID  . furosemide  80 mg Intravenous BID  . loratadine  10 mg Oral Daily  . magnesium oxide  400 mg Oral Daily  . metolazone  5 mg Oral Daily  . potassium chloride SA  40 mEq Oral BID  . sildenafil  20 mg Oral TID  . sodium chloride  10-40 mL Intracatheter Q12H  . sodium chloride  3 mL Intravenous Q12H  . warfarin  5 mg Oral ONCE-1800  . Warfarin - Pharmacist Dosing Inpatient   Does not apply q1800   Continuous Infusions: . sodium chloride 10 mL/hr at 03/31/14 2257  . milrinone 0.375 mcg/kg/min (04/02/14 9735)     Charlynne Cousins  [facility] Pager [phone] If 8PM-8AM, please contact night-coverage at [email or web address], password [facility] 04/02/2014, 11:18 AM  LOS: 17 days

## 2014-04-03 LAB — BASIC METABOLIC PANEL
ANION GAP: 15 (ref 5–15)
BUN: 44 mg/dL — AB (ref 6–23)
CO2: 35 mEq/L — ABNORMAL HIGH (ref 19–32)
Calcium: 9.1 mg/dL (ref 8.4–10.5)
Chloride: 90 mEq/L — ABNORMAL LOW (ref 96–112)
Creatinine, Ser: 1.65 mg/dL — ABNORMAL HIGH (ref 0.50–1.35)
GFR, EST AFRICAN AMERICAN: 44 mL/min — AB (ref >90)
GFR, EST NON AFRICAN AMERICAN: 38 mL/min — AB (ref >90)
Glucose, Bld: 164 mg/dL — ABNORMAL HIGH (ref 70–99)
Potassium: 3.6 mEq/L — ABNORMAL LOW (ref 3.7–5.3)
Sodium: 140 mEq/L (ref 137–147)

## 2014-04-03 LAB — PROTIME-INR
INR: 1.86 — AB (ref 0.00–1.49)
Prothrombin Time: 21.6 seconds — ABNORMAL HIGH (ref 11.6–15.2)

## 2014-04-03 MED ORDER — METOLAZONE 5 MG PO TABS
5.0000 mg | ORAL_TABLET | Freq: Two times a day (BID) | ORAL | Status: DC
  Administered 2014-04-03 – 2014-04-04 (×3): 5 mg via ORAL
  Filled 2014-04-03 (×5): qty 1

## 2014-04-03 MED ORDER — TORSEMIDE 20 MG PO TABS
80.0000 mg | ORAL_TABLET | Freq: Two times a day (BID) | ORAL | Status: DC
  Administered 2014-04-03 – 2014-04-05 (×4): 80 mg via ORAL
  Filled 2014-04-03 (×6): qty 4

## 2014-04-03 MED ORDER — WARFARIN SODIUM 5 MG PO TABS
5.0000 mg | ORAL_TABLET | Freq: Once | ORAL | Status: AC
  Administered 2014-04-03: 5 mg via ORAL
  Filled 2014-04-03: qty 1

## 2014-04-03 MED ORDER — INFLUENZA VAC SPLIT QUAD 0.5 ML IM SUSY
0.5000 mL | PREFILLED_SYRINGE | INTRAMUSCULAR | Status: AC
  Administered 2014-04-04: 0.5 mL via INTRAMUSCULAR
  Filled 2014-04-03: qty 0.5

## 2014-04-03 MED ORDER — POTASSIUM CHLORIDE CRYS ER 20 MEQ PO TBCR
40.0000 meq | EXTENDED_RELEASE_TABLET | Freq: Once | ORAL | Status: AC
  Administered 2014-04-03: 40 meq via ORAL

## 2014-04-03 NOTE — Progress Notes (Addendum)
[facility] PROGRESS NOTE Interim History: [name] is a 80 y.o. male w/ PMHx significant for NICM with EF of 20% on chronic milrinone, HTN, pulm HTN, chronic renal insufficiency (baseline CR 1.8), liposarcoma  and PAF. Recently discharged 9/18 after AKI. Discharged weight was 160 lbs. Last week about PICC line being out 7 cm and was scheduled for PICC exchange which he underwent (9/24). Presented to the ED 9/27 with fevers 103 and erythema around PICC. Concern for sepsis and PICC exchanged. CT abdomen (9/28): increasing infiltration and atelectasis in the lung bases possible PNA. PICC discontinued 9/27. BCX MSSA x 2. Echo EF 20%. No obvious vegetation. Surveillance bcx remain negative. Completed course antibiotic in house 12.12.2015.Remains on milrinone 0.375 mcg. Volume status remains elevated and weight up 4 lbs, 24 hr I/O -1.3 liters.   Filed Weights   04/01/14 0545 04/02/14 0506 04/03/14 0602  Weight: 74.798 kg (164 lb 14.4 oz) 76.34 kg (168 lb 4.8 oz) 75.4 kg (166 lb 3.6 oz)        Intake/Output Summary (Last 24 hours) at 04/03/14 1103 Last data filed at 04/03/14 0801  Gross per 24 hour  Intake    564 ml  Output   2251 ml  Net  -1687 ml     Assessment/Plan: Acute respiratory failure with hypoxia due to acute on chronic systolic heart failure:  Severe sepsis due to MSSA bacteremia and HCAP:  -Multifactorial secondary to MSSA bacteremia (due to PICC line) and probable HCAP.  -Re-Inserted PICC line on 10.5.2015  -ID consulted. TTE does not show valvular abnormality  -has now completed antibiotic regimen.  Acute respiratory failure with hypoxia due to acute on chronic systolic heart failure:  - Secondary to acute on chronic CHF exacerbation and HCAP.  - Patient currently on milrinone, metolazone and furosemide.  - Still with elevated volume status; will add unna boots,on demadex 80mg  BID and metolazone daily  - Advance heart failure team on board; will follow rec's    Acute on chronic kidney disease stage III  - Elevated creatinine was likely secondary Heart failure. - mild elevation in Cr. monitor  Paroxysmal atrial fibrillation  -Currently rate controlled on amiodarone.  - INR subtherapeutic.   Left leg skin lesion:  - Stocking hoses discontinued; will now apply UNA Boots  - No open skin or drainage  - Keep legs elevated   Code Status: full  Family Communication: wife  Disposition Plan: inpatient    Consultants:  ID  cardiology  Procedures: ECHO: does not show abnormality  Antibiotics:  HPI/Subjective: Feels better.  Objective: Filed Vitals:   04/03/14 0218 04/03/14 0602 04/03/14 0801 04/03/14 0917  BP: 97/43 99/51 104/55   Pulse: 84 89 92   Temp: 97.6 F (36.4 C) 98.5 F (36.9 C) 97.3 F (36.3 C)   TempSrc: Oral Oral Oral   Resp: 20 18 18    Height:      Weight:  75.4 kg (166 lb 3.6 oz)    SpO2: 94% 94% 94% 91%     Exam:  General: Alert, awake, oriented x3, in no acute distress.  HEENT: No bruits, no goiter.  Heart: Regular rate and rhythm. Lungs: Good air movement, clear Abdomen: Soft, nontender, distended, positive bowel sounds.    Data Reviewed: Basic Metabolic Panel:  Recent Labs Lab 03/30/14 0500 03/31/14 0450 04/01/14 0425 04/02/14 0601 04/03/14 0536  NA 140 141 142 142 140  K 3.6* 3.4* 3.9 3.7 3.6*  CL 89* 90* 92* 92* 90*  CO2  35* 39* 39* 39* 35*  GLUCOSE 191* 127* 122* 120* 164*  BUN 38* 39* 40* 41* 44*  CREATININE 1.63* 1.62* 1.56* 1.58* 1.65*  CALCIUM 8.7 9.1 9.0 9.0 9.1   Liver Function Tests: No results found for this basename: AST, ALT, ALKPHOS, BILITOT, PROT, ALBUMIN,  in the last 168 hours No results found for this basename: LIPASE, AMYLASE,  in the last 168 hours No results found for this basename: AMMONIA,  in the last 168 hours CBC:  Recent Labs Lab 03/30/14 0500  WBC 14.2*  HGB 10.1*  HCT 31.5*  MCV 97.8  PLT 312   Cardiac Enzymes: No results found for this  basename: CKTOTAL, CKMB, CKMBINDEX, TROPONINI,  in the last 168 hours BNP (last 3 results)  Recent Labs  11/07/13 0708 03/01/14 1959 03/08/14 2230  PROBNP 9356.0* 11880.0* 15334.0*   CBG: No results found for this basename: GLUCAP,  in the last 168 hours  No results found for this or any previous visit (from the past 240 hour(s)).   Studies: No results found.  Scheduled Meds: . acidophilus  2 capsule Oral Daily  . amiodarone  200 mg Oral BID  . atorvastatin  20 mg Oral q1800  . budesonide (PULMICORT) nebulizer solution  0.25 mg Nebulization BID  . [START ON 04/04/2014] Influenza vac split quadrivalent PF  0.5 mL Intramuscular Tomorrow-1000  . loratadine  10 mg Oral Daily  . magnesium oxide  400 mg Oral Daily  . metolazone  5 mg Oral Daily  . potassium chloride SA  40 mEq Oral BID  . sildenafil  20 mg Oral TID  . sodium chloride  10-40 mL Intracatheter Q12H  . sodium chloride  3 mL Intravenous Q12H  . torsemide  80 mg Oral BID  . Warfarin - Pharmacist Dosing Inpatient   Does not apply q1800   Continuous Infusions: . sodium chloride 10 mL/hr at 03/31/14 2257  . milrinone 0.375 mcg/kg/min (04/03/14 1000)     Charlynne Cousins  [facility] Pager [phone] If 8PM-8AM, please contact night-coverage at [email or web address], password [facility] 04/03/2014, 11:03 AM  LOS: 18 days

## 2014-04-03 NOTE — Progress Notes (Signed)
ANTICOAGULATION CONSULT NOTE - Follow Up Consult  Pharmacy Consult for Coumadin  Indication: atrial fibrillation   Allergies  Allergen Reactions  . Lisinopril Rash    Patient Measurements: Height: 5\' 8"  (172.7 cm) Weight: 166 lb 3.6 oz (75.4 kg) IBW/kg (Calculated) : 68.4  Vital Signs: Temp: 97.3 F (36.3 C) (10/15 0801) Temp Source: Oral (10/15 0801) BP: 104/55 mmHg (10/15 0801) Pulse Rate: 92 (10/15 0801)  Labs:  Recent Labs  04/01/14 0425 04/02/14 0601 04/03/14 0536  LABPROT 21.3* 19.7* 21.6*  INR 1.84* 1.67* 1.86*  CREATININE 1.56* 1.58* 1.65*    Estimated Creatinine Clearance: 35.1 ml/min (by C-G formula based on Cr of 1.65).  Assessment: 80yo male continues on chronic coumadin for h/o afib. Also continues on amiodarone (on PTA). INR within goal for >7 days on 2- 3 mg daily, however, INR has dropped over last several days. Most recently INR 1.84>>1.67>>1.86 after 4 and 5mg  doses give. Patient not reporting diarrhea or eating dark greens, but was recently started on Ensure TID which  contributed to drop in INR.  Home dose: 4mg  daily except 2mg  Friday and Sunday   Goal of Therapy:  INR 2-3 Monitor platelets by anticoagulation protocol: Yes   Plan:  1) Coumadin 5mg  x 1 2) INR in AM  Megan E. Supple, Pharm.D Clinical Pharmacy Resident Pager: [phone] 04/03/2014 11:18 AM

## 2014-04-03 NOTE — Progress Notes (Signed)
[id number] Heart Failure Rounding Note   Subjective:    [name] is a 80 y.o. male w/ PMHx significant for NICM with EF of 20% on chronic milrinone, HTN, pulm HTN, chronic renal insufficiency (baseline CR 1.8), retroperitoneal mass and PAF.  Just recently discharged 9/18 after AKI. Discharged weight was 160 lbs. Called last week about PICC line being out 7 cm and was scheduled for PICC exchange which he underwent (9/24).  Presented to the ED 9/27 with fevers 103 and erythema around PICC. Concern for sepsis and PICC exchanged. CT abdomen (9/28): increasing infiltration and atelectasis in the lung bases possible PNA. PICC discontinued 9/27. BCX MSSA x 2. Echo EF 20%. No obvious vegetation. Surveillance bcx remain negative.   Remains on milrinone 0.375 mcg. Started lasix 80 mg IV BID yesterday with 5 mg metolazone. Weight down 2 lbs and 24 hr I/O -1.1 liters. Denies SOB. +DOE  Creatinine 1.65  Objective:   Weight Range:  Vital Signs:   Temp:  [97.3 F (36.3 C)-98.6 F (37 C)] 97.3 F (36.3 C) (10/15 0801) Pulse Rate:  [84-92] 92 (10/15 0801) Resp:  [15-20] 18 (10/15 0801) BP: (97-104)/(41-55) 104/55 mmHg (10/15 0801) SpO2:  [94 %-99 %] 94 % (10/15 0801) Weight:  [166 lb 3.6 oz (75.4 kg)] 166 lb 3.6 oz (75.4 kg) (10/15 0602) Last BM Date: 04/01/14  Weight change: Filed Weights   04/01/14 0545 04/02/14 0506 04/03/14 0602  Weight: 164 lb 14.4 oz (74.798 kg) 168 lb 4.8 oz (76.34 kg) 166 lb 3.6 oz (75.4 kg)    Intake/Output:   Intake/Output Summary (Last 24 hours) at 04/03/14 0838 Last data filed at 04/03/14 0700  Gross per 24 hour  Intake    804 ml  Output   1952 ml  Net  -1148 ml     Physical Exam: General:  Elderly appearing. No resp difficulty, on 2L [place] Sitting in chair.  HEENT: normal Neck: supple. JVP 9 Carotids 2+ bilat; no bruits. No lymphadenopathy or thryomegaly appreciated.  Cor: PMI laterally displaced + S3 R upper chest PICC placed. Distant  regular Lungs: Lungs clear. Abdomen: nontender, + distended. No hepatosplenomegaly. No bruits or masses. Good bowel sounds. Extremities: no cyanosis, clubbing, rash, R and LLE 2+ edema. Ulcer on L shin  5cm area of erythema on dorsum of left foot, UNNA boots intact Neuro: alert & orientedx3, cranial nerves grossly intact. moves all 4 extremities w/o difficulty. Affect pleasant  Telemetry: V paced 90s  Labs: Basic Metabolic Panel:  Recent Labs Lab 03/30/14 0500 03/31/14 0450 04/01/14 0425 04/02/14 0601 04/03/14 0536  NA 140 141 142 142 140  K 3.6* 3.4* 3.9 3.7 3.6*  CL 89* 90* 92* 92* 90*  CO2 35* 39* 39* 39* 35*  GLUCOSE 191* 127* 122* 120* 164*  BUN 38* 39* 40* 41* 44*  CREATININE 1.63* 1.62* 1.56* 1.58* 1.65*  CALCIUM 8.7 9.1 9.0 9.0 9.1    Liver Function Tests: No results found for this basename: AST, ALT, ALKPHOS, BILITOT, PROT, ALBUMIN,  in the last 168 hours No results found for this basename: LIPASE, AMYLASE,  in the last 168 hours No results found for this basename: AMMONIA,  in the last 168 hours  CBC:  Recent Labs Lab 03/30/14 0500  WBC 14.2*  HGB 10.1*  HCT 31.5*  MCV 97.8  PLT 312    Cardiac Enzymes: No results found for this basename: CKTOTAL, CKMB, CKMBINDEX, TROPONINI,  in the last 168 hours  BNP: BNP (last 3 results)  Recent Labs  11/07/13 0708 03/01/14 1959 03/08/14 2230  PROBNP 9356.0* 11880.0* 15334.0*     Other results:  EKG:   Imaging: No results found.   Medications:     Scheduled Medications: . acidophilus  2 capsule Oral Daily  . amiodarone  200 mg Oral BID  . atorvastatin  20 mg Oral q1800  . budesonide (PULMICORT) nebulizer solution  0.25 mg Nebulization BID  . furosemide  80 mg Intravenous BID  . loratadine  10 mg Oral Daily  . magnesium oxide  400 mg Oral Daily  . metolazone  5 mg Oral Daily  . potassium chloride SA  40 mEq Oral BID  . sildenafil  20 mg Oral TID  . sodium chloride  10-40 mL Intracatheter  Q12H  . sodium chloride  3 mL Intravenous Q12H  . Warfarin - Pharmacist Dosing Inpatient   Does not apply q1800    Infusions: . sodium chloride 10 mL/hr at 03/31/14 2257  . 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 is well known to the HF team and was just admitted for AKI and abdominal distention. He went home on 9/18 and called the HF clinic last week d/t PICC being out 7 cm. Underwent exchange 9/24 and subsequently presented to the ED on 9/27 with fevers and erythema around PICC line. Bcx 2/2 MSSA. He also has PNA noted on CT.   Bcx 2/2 for MSSA. ID has seen. Finished abx 10/12 Has  power picc in RIJ line.  Still with significant edema and are having trouble with diuresis. Will change to torsemide 80 mg BID and try to send home tomorrow. Continue metolazone 5 mg daily. Before going home will need a translator to explain medication changes. He is not having much SOB and he is not open to palliative care/hospice. Will send home and see if he can manage.  Discussed fluid restriction and sodium restrictions.   [facility] to resume care for home milrinone. Very concerned that patient is not going to be able to remain at home comfortably. Dr. Haroldine Laws had talk with him and his wife about SNF/Hospice and he is not interested currently and wants to continue with aggressive care.   Rande Tyler Christensen NP_C 04/03/2014 8:38 AM   Patient seen and examined with Junie Bame,  NP. We discussed all aspects of the encounter. I agree with the assessment and plan as stated above.   Volume status remains an issue. Not sure we can get him much better. Would switch to po demadex 80 bid. Continue milrinone (patient refuses Hospice). Possibly home in am.   Benay Spice 7:17 PM

## 2014-04-04 DIAGNOSIS — A4101 Sepsis due to Methicillin susceptible Staphylococcus aureus: Secondary | ICD-10-CM | POA: Diagnosis not present

## 2014-04-04 LAB — BASIC METABOLIC PANEL
Anion gap: 15 (ref 5–15)
BUN: 49 mg/dL — AB (ref 6–23)
CALCIUM: 9.1 mg/dL (ref 8.4–10.5)
CO2: 34 meq/L — AB (ref 19–32)
CREATININE: 1.79 mg/dL — AB (ref 0.50–1.35)
Chloride: 93 mEq/L — ABNORMAL LOW (ref 96–112)
GFR calc Af Amer: 40 mL/min — ABNORMAL LOW (ref >90)
GFR calc non Af Amer: 34 mL/min — ABNORMAL LOW (ref >90)
GLUCOSE: 139 mg/dL — AB (ref 70–99)
Potassium: 3.9 mEq/L (ref 3.7–5.3)
Sodium: 142 mEq/L (ref 137–147)

## 2014-04-04 LAB — PROTIME-INR
INR: 2.1 — ABNORMAL HIGH (ref 0.00–1.49)
Prothrombin Time: 23.7 seconds — ABNORMAL HIGH (ref 11.6–15.2)

## 2014-04-04 MED ORDER — TORSEMIDE 20 MG PO TABS: 80.0000 mg | ORAL_TABLET | Freq: Two times a day (BID) | ORAL | Status: DC

## 2014-04-04 MED ORDER — METOLAZONE 5 MG PO TABS: 5.0000 mg | ORAL_TABLET | Freq: Two times a day (BID) | ORAL | Status: DC

## 2014-04-04 MED ORDER — METOLAZONE 5 MG PO TABS: 5.0000 mg | ORAL_TABLET | Freq: Every day | ORAL | Status: DC

## 2014-04-04 MED ORDER — WARFARIN SODIUM 4 MG PO TABS
4.0000 mg | ORAL_TABLET | Freq: Once | ORAL | Status: AC
  Administered 2014-04-04: 4 mg via ORAL
  Filled 2014-04-04: qty 1

## 2014-04-04 NOTE — Progress Notes (Signed)
Per physician, pt will be discharge with Milrinone to be admin at home.  The Home Health nurse will need to be notified when he is ready to be discharged so that IV pump can be arranged for pt.

## 2014-04-04 NOTE — Progress Notes (Signed)
Patient evaluated for community based chronic disease management services with [facility] as a benefit of patient's [employer]. Spoke with patient via interpreter at bedside to explain [facility] services.  Services have been accepted with written consent.  His wife, April Holding, is his authorized contact.  Patient goes to the CHF clinic for management.  [facility] will support ongoing goals of care management and community resource needs as appropriate.  Patient will receive a post discharge transition of care call and will be evaluated for monthly home visits for assessments and disease process education.  Left contact information and [facility] literature at bedside. Made Inpatient Case Manager aware that [facility] following. Of note, [facility] Management services does not replace or interfere with any services that are arranged by inpatient case management or social work.  For additional questions or referrals please contact Corliss Blacker BSN RN [facility] Liaison at [phone].

## 2014-04-04 NOTE — Progress Notes (Signed)
[id number] Heart Failure Rounding Note   Subjective:    Tyler Christensen is a 80 y.o. male w/ PMHx significant for NICM with EF of 20% on chronic milrinone, HTN, pulm HTN, chronic renal insufficiency (baseline CR 1.8), retroperitoneal mass and PAF.  Just recently discharged 9/18 after AKI. Discharged weight was 160 lbs. Called last week about PICC line being out 7 cm and was scheduled for PICC exchange which he underwent (9/24).  Presented to the ED 9/27 with fevers 103 and erythema around PICC. Concern for sepsis and PICC exchanged. CT abdomen (9/28): increasing infiltration and atelectasis in the lung bases possible PNA. PICC discontinued 9/27. BCX MSSA x 2. Echo EF 20%. No obvious vegetation. Surveillance bcx remain negative.   Remains on milrinone 0.375 mcg. Switched to torsemide 80 mg BID and metolazone 5 mg BID. WEight down 3 lbs and 24 hr I/O -2.7 liters.    Creatinine 1.65  Objective:   Weight Range:  Vital Signs:   Temp:  [97.6 F (36.4 C)-97.9 F (36.6 C)] 97.9 F (36.6 C) (10/16 0641) Pulse Rate:  [87-94] 90 (10/16 0641) Resp:  [18-20] 20 (10/16 0641) BP: (96-113)/(43-62) 102/46 mmHg (10/16 0641) SpO2:  [91 %-96 %] 96 % (10/16 0731) Weight:  [163 lb 14.4 oz (74.345 kg)] 163 lb 14.4 oz (74.345 kg) (10/16 0641) Last BM Date: 04/01/14  Weight change: Filed Weights   04/02/14 0506 04/03/14 0602 04/04/14 0641  Weight: 168 lb 4.8 oz (76.34 kg) 166 lb 3.6 oz (75.4 kg) 163 lb 14.4 oz (74.345 kg)    Intake/Output:   Intake/Output Summary (Last 24 hours) at 04/04/14 0818 Last data filed at 04/04/14 0655  Gross per 24 hour  Intake    314 ml  Output   2650 ml  Net  -2336 ml     Physical Exam: General:  Elderly appearing. No resp difficulty, on 2L [place] Sitting in chair.  HEENT: normal Neck: supple. JVP 9 Carotids 2+ bilat; no bruits. No lymphadenopathy or thryomegaly appreciated.  Cor: PMI laterally displaced + S3 R upper chest PICC placed. Distant regular Lungs: Lungs  clear. Abdomen: nontender, + distended. No hepatosplenomegaly. No bruits or masses. Good bowel sounds. Extremities: no cyanosis, clubbing, rash, R and LLE 2+ edema. Ulcer on L shin  5cm area of erythema on dorsum of left foot, UNNA boots intact Neuro: alert & orientedx3, cranial nerves grossly intact. moves all 4 extremities w/o difficulty. Affect pleasant  Telemetry: V paced 90s  Labs: Basic Metabolic Panel:  Recent Labs Lab 03/31/14 0450 04/01/14 0425 04/02/14 0601 04/03/14 0536 04/04/14 0505  NA 141 142 142 140 142  K 3.4* 3.9 3.7 3.6* 3.9  CL 90* 92* 92* 90* 93*  CO2 39* 39* 39* 35* 34*  GLUCOSE 127* 122* 120* 164* 139*  BUN 39* 40* 41* 44* 49*  CREATININE 1.62* 1.56* 1.58* 1.65* 1.79*  CALCIUM 9.1 9.0 9.0 9.1 9.1    Liver Function Tests: No results found for this basename: AST, ALT, ALKPHOS, BILITOT, PROT, ALBUMIN,  in the last 168 hours No results found for this basename: LIPASE, AMYLASE,  in the last 168 hours No results found for this basename: AMMONIA,  in the last 168 hours  CBC:  Recent Labs Lab 03/30/14 0500  WBC 14.2*  HGB 10.1*  HCT 31.5*  MCV 97.8  PLT 312    Cardiac Enzymes: No results found for this basename: CKTOTAL, CKMB, CKMBINDEX, TROPONINI,  in the last 168 hours  BNP: BNP (last 3 results)  Recent  Labs  11/07/13 0708 03/01/14 1959 03/08/14 2230  PROBNP 9356.0* 11880.0* 15334.0*     Other results:  EKG:   Imaging: No results found.   Medications:     Scheduled Medications: . acidophilus  2 capsule Oral Daily  . amiodarone  200 mg Oral BID  . atorvastatin  20 mg Oral q1800  . budesonide (PULMICORT) nebulizer solution  0.25 mg Nebulization BID  . Influenza vac split quadrivalent PF  0.5 mL Intramuscular Tomorrow-1000  . loratadine  10 mg Oral Daily  . magnesium oxide  400 mg Oral Daily  . metolazone  5 mg Oral BID  . potassium chloride SA  40 mEq Oral BID  . sildenafil  20 mg Oral TID  . sodium chloride  10-40 mL  Intracatheter Q12H  . sodium chloride  3 mL Intravenous Q12H  . torsemide  80 mg Oral BID  . Warfarin - Pharmacist Dosing Inpatient   Does not apply q1800    Infusions: . sodium chloride 10 mL/hr at 03/31/14 2257  . 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 is well known to the HF team and was just admitted for AKI and abdominal distention. He went home on 9/18 and called the HF clinic last week d/t PICC being out 7 cm. Underwent exchange 9/24 and subsequently presented to the ED on 9/27 with fevers and erythema around PICC line. Bcx 2/2 MSSA. He also has PNA noted on CT.   Bcx 2/2 for MSSA. ID has seen. Finished abx 10/12 Has  power picc in RIJ line.  Still with significant edema and are having trouble with diuresis. Will change to torsemide 80 mg BID and try to send home tomorrow. Continue metolazone 5 mg daily. Before going home will need a translator to explain medication changes. He is not having much SOB and he is not open to palliative care/hospice. Will send home and see if he can manage.  Discussed fluid restriction and sodium restrictions.   [facility] to resume care for home milrinone. Very concerned that patient is not going to be able to remain at home comfortably. Dr. Haroldine Laws had talk with him and his wife about SNF/Hospice and he is not interested currently and wants to continue with aggressive care.   Rande Brunt NP_C 04/04/2014 8:18 AM   Patient seen and examined with Junie Bame, NP. We discussed all aspects of the encounter. I agree with the assessment and plan as stated above.   Good diuresis on above regimen. Ok to send home today with close f/u in HF clinic. Would use demadex 80 bid and metolazone 5mg  daily. Suspect we will have to cut this back soon but should be ok for next few days.   Mckaila Duffus,MD 9:00 AM

## 2014-04-04 NOTE — Discharge Summary (Addendum)
Physician Discharge Summary  Tyler Christensen DVV:[id number] DOB: 1932-10-01 DOA: 03/16/2014  PCP: Harvie Junior, MD  Admit date: 03/16/2014 Discharge date: 04/04/2014  Time spent: 40  minutes  Recommendations for Outpatient Follow-up:  1. Follow up with Heart clinic in 1 week.check B-met. 2. Needs repeated BC on 10.19.2015.  BNP    Component Value Date/Time   PROBNP 15334.0* 03/08/2014 2230   Filed Weights   04/02/14 0506 04/03/14 0602 04/04/14 0641  Weight: 76.34 kg (168 lb 4.8 oz) 75.4 kg (166 lb 3.6 oz) 74.345 kg (163 lb 14.4 oz)     Discharge Diagnoses:  Principal Problem:   Severe sepsis Active Problems:   HTN (hypertension)   Atrial fibrillation   HLD (hyperlipidemia)   Long-term (current) use of anticoagulants   ICD (implantable cardioverter-defibrillator), biventricular, in situ   Acute on chronic systolic heart failure   CKD (chronic kidney disease) stage 3, GFR 30-59 ml/min   Sepsis   Acute respiratory failure with hypoxia   Nausea & vomiting   MSSA (methicillin susceptible Staphylococcus aureus) septicemia/MSSA bacteremia   HCAP (healthcare-associated pneumonia)   Discharge Condition: guarded  Diet recommendation: low sodium fluid restricted   History of present illness:  80 y.o. 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 Patient has been having fever last 3 days with increasing shortness of breath and nausea vomiting. Patient states that he's unable to take anything because of nausea vomiting. Patient states his abdominal discomfort also has increased from prior with increasing abdominal distention. In the ER patient was found to be febrile with temperatures around 103F and patient was initially mild hypotensive and was given 2 L normal saline bolus  following which patient pressure improved. Chest x-ray shows congestive pattern and UA was unremarkable. Patient on exam has erythema around his right-sided upper extremity PICC line. PICC line was placed in September and patient states that since it is erythematous   Hospital Course:  Severe sepsis due to MSSA bacteremia and HCAP:  -Multifactorial secondary to MSSA bacteremia (due to PICC line) and probable HCAP. D/c PICC line. - BC showed Sensitive to cefepime MSSA, repeated BC remained negative. -Re-Inserted PICC line on 10.5.2015  -ID consulted. TTE does not show valvular abnormality  -Has now completed antibiotic regimen for HCAP and MSSA bacteremia on 10/12; needs repeat blood cultures in 1 week (04/07/14).  Acute respiratory failure with hypoxia due to acute on chronic systolic heart failure:  - Secondary to acute on chronic CHF exacerbation and HCAP. - Advance heart failure team on board. - he was diuresed with torsemide and metolazone. Was hard to diuresed - Patient currently on milrinone, metolazone and torsemide. - Still overloaded, will go home on torsemide 80 BID and metolazone 5 QD. - Had Long discussion with him and his wife yesterday about prognosis and EOL issues. He wants to continue with aggressive care for now including inotropes.  - pt desats with ambulation will go home on oxygen.  Nausea and emesis  - Due to acute decompensated HF and sepsis, now resolved  - According to patient appetite better now that he is not vomiting   Acute on chronic kidney disease stage III  - On admission Cr was 2.9, with diuresed it slightly improved to 1.5-1.7 - will need outpatient follow up.  Paroxysmal atrial fibrillation  - Currently rate  controlled on amiodarone.  - INR subtherapeutic.   pulmonary hypertension  - Continue sildenafil.   Severe protein caloric malnutrition:  - Patient encourage to increase PO intake   Diarrhea  - C. difficile PCR negative.  - WBC down from  almost 30,000 on admission .  -stool is more form now   Left leg skin lesion:  - Stocking hoses discontinued; will now apply [employer]s  - No open skin or drainage  - Keep legs elevated   Procedures:  Ct abd  ABD x-ray  Ct abd and pelvis Echo 9.30.2015: The estimated ejection fraction was in the range of 20% to 25%. Wall motion was normal; there were no regional wall motion abnormalities. Doppler parameters are consistent with a reversible restrictive pattern, indicative of decreased left ventricular diastolic compliance and/or increased left atrial pressure (grade 3 diastolic dysfunction). Doppler parameters are consistent with elevated ventricular end-diastolic filling pressure.   Consultations:  Advance heart failure team  ID  Discharge Exam: Filed Vitals:   04/04/14 0641  BP: 102/46  Pulse: 90  Temp: 97.9 F (36.6 C)  Resp: 20    General: A&O x3 Cardiovascular: RRR, +JVD, 3+ edema Respiratory: good air movement CTA B/L  Discharge Instructions      Discharge Instructions   Diet - low sodium heart healthy    Complete by:  As directed      Increase activity slowly    Complete by:  As directed             Medication List    STOP taking these medications       furosemide 40 MG tablet  Commonly known as:  LASIX      TAKE these medications       amiodarone 200 MG tablet  Commonly known as:  PACERONE  Take 1 tablet (200 mg total) by mouth 2 (two) times daily.     atorvastatin 20 MG tablet  Commonly known as:  LIPITOR  Take 1 tablet (20 mg total) by mouth daily at 6 PM.     loratadine 10 MG tablet  Commonly known as:  CLARITIN  Take 10 mg by mouth daily.     magnesium oxide 400 MG tablet  Commonly known as:  MAG-OX  Take 400 mg by mouth daily.     metolazone 5 MG tablet  Commonly known as:  ZAROXOLYN  Take 1 tablet (5 mg total) by mouth daily.     milrinone 20 MG/100ML Soln infusion  Commonly known as:  PRIMACOR  Inject 26.775 mcg/min  into the vein continuous.     Oxycodone HCl 10 MG Tabs  Take 1 tablet (10 mg total) by mouth every 4 (four) hours as needed for severe pain.     potassium chloride SA 20 MEQ tablet  Commonly known as:  K-DUR,KLOR-CON  Take 1 tablet (20 mEq total) by mouth daily.     senna-docusate 8.6-50 MG per tablet  Commonly known as:  Senokot-S  Take 1 tablet by mouth at bedtime.     sildenafil 20 MG tablet  Commonly known as:  REVATIO  Take 1 tablet (20 mg total) by mouth 3 (three) times daily.     torsemide 20 MG tablet  Commonly known as:  DEMADEX  Take 4 tablets (80 mg total) by mouth 2 (two) times daily.     traMADol 50 MG tablet  Commonly known as:  ULTRAM  Take 50 mg by mouth every 12 (twelve) hours as needed for moderate  pain.     warfarin 4 MG tablet  Commonly known as:  COUMADIN  Take 2-4 mg by mouth daily. $RemoveBefo'4mg'BUdYSrAjMJY$  daily except Friday and Sunday takes $RemoveBefo'2mg'GnOYtBGBzaK$        Allergies  Allergen Reactions  . Lisinopril Rash   Follow-up Information   Follow up with Glori Bickers, MD On 04/01/2014. (at 09:45)    Specialty:  Cardiology   Contact information:   [address] [place] [address] [phone]        The results of significant diagnostics from this hospitalization (including imaging, microbiology, ancillary and laboratory) are listed below for reference.    Significant Diagnostic Studies: Ct Abdomen Pelvis Wo Contrast  03/17/2014   CLINICAL DATA:  Abdominal pain.  Nausea and vomiting.  EXAM: CT ABDOMEN AND PELVIS WITHOUT CONTRAST  TECHNIQUE: Multidetector CT imaging of the abdomen and pelvis was performed following the standard protocol without IV contrast.  COMPARISON:  03/02/2014  FINDINGS: Technically limited study due to motion artifact. Infiltration and atelectasis in the lung bases. This is progressing since previous study. Changes may indicate pneumonia. Diffuse cardiac enlargement.  The unenhanced appearance of the liver, spleen, gallbladder,  pancreas, adrenal glands, inferior vena cava, and retroperitoneal lymph nodes is unremarkable. Diffuse calcification of the abdominal aorta with congenital small diameter. Exophytic lesions arising from both kidneys, largest on the left measuring 5.1 cm diameter. These are likely cysts. No hydronephrosis in either kidney. Heterogeneous fat density mass in the right side of the retroperitoneum posterior to the vena cava and adjacent to the psoas muscle. This lesion measures about 5.3 x 3.4 cm. Appearance suggests liposarcoma. Stomach, small bowel, and colon are not abnormally distended. Small esophageal hiatal hernia. No free air or free fluid in the abdomen.  Pelvis: Lipoma in the inferior left flank muscles. Prostate gland is enlarged. Bladder wall is not thickened. Appendix is not identified but no inflammatory changes are suggested in the right lower quadrant. No evidence of diverticulitis. Scattered diverticula are present in the sigmoid colon. Degenerative changes in the spine. No destructive bone lesions appreciated.  IMPRESSION: Increasing infiltration and atelectasis in the lung bases suggesting possible pneumonia. Otherwise no significant change since previous study. Again demonstrated is a heterogeneous fat density mass in the right retroperitoneum which may indicate liposarcoma. Additional incidental findings as discussed.   Electronically Signed   By: Lucienne Capers M.D.   On: 03/17/2014 03:51   Dg Chest 2 View  03/13/2014   CLINICAL DATA:  Evaluate PICC line location.  EXAM: CHEST  2 VIEW  COMPARISON:  Chest radiograph 03/08/2014  FINDINGS: Interval retraction of right upper extremity PICC line with tip projecting at the central right brachiocephalic vein. Multi lead pacer apparatus overlies the left hemi thorax, leads are stable in position. Stable cardiomegaly. Persistent elevation right hemidiaphragm. Unchanged bilateral lower lung heterogeneous pulmonary opacities. No definite pleural effusion  or pneumothorax. Regional skeleton is unremarkable.  IMPRESSION: Interval retraction of right upper extremity PICC line with tip projecting over the central right brachiocephalic vein.  Cardiomegaly with mild interstitial pulmonary edema.   Electronically Signed   By: Lovey Newcomer M.D.   On: 03/13/2014 22:01   Dg Chest 2 View  03/08/2014   CLINICAL DATA:  Cough.  EXAM: CHEST  2 VIEW  COMPARISON:  Chest radiograph performed 03/06/2014  FINDINGS: The lungs are well-aerated. Vascular congestion is noted, with increased interstitial markings, raising concern for mild interstitial edema. No pleural effusion or pneumothorax is seen.  The heart is  mildly enlarged. A pacemaker/AICD is noted at the left chest wall, with leads ending at the right atrium, right ventricle and coronary sinus. No acute osseous abnormalities are seen. A right PICC is noted ending about the distal SVC.  IMPRESSION: Vascular congestion and mild cardiomegaly, with increased interstitial markings, raising concern for mild interstitial edema.   Electronically Signed   By: Garald Balding M.D.   On: 03/08/2014 23:48   Dg Chest 2 View  03/06/2014   CLINICAL DATA:  Accidental right PICC line removal by the patient  EXAM: CHEST  2 VIEW  COMPARISON:  Portable chest x-ray of March 01, 2014  FINDINGS: The right-sided PICC line is no longer evident. There is no pneumothorax or pleural effusion. The pulmonary interstitial markings are mildly prominent but stable. There is stable atelectasis in the right middle lobe medially. The cardiac silhouette is top-normal in size. The pulmonary vascularity is prominent centrally. The permanent pacemaker-defibrillator is unchanged in position. The bony thorax is unremarkable.  IMPRESSION: 1. There is no evidence of a post PICC line removal complication. 2. Low-grade compensated CHF with mild interstitial edema.   Electronically Signed   By: David  [place]   On: 03/06/2014 16:58   Dg Abd 1 View  03/06/2014    CLINICAL DATA:  Abdominal pain and distention.  EXAM: ABDOMEN - 1 VIEW  COMPARISON:  06/22/2012  FINDINGS: Air-filled loops of small enlarged bowel are noted. There are no dilated loops of small bowel to suggest obstruction. No abnormal abdominal or pelvic calcifications noted.  IMPRESSION: 1. Nonobstructive bowel gas pattern.   Electronically Signed   By: Kerby Moors M.D.   On: 03/06/2014 10:10   US Abdomen Limited  03/18/2014   CLINICAL DATA:  80 year old male with abdominal distension and clinical suggestion of ascites. Staph bacteremia.  EXAM: LIMITED ABDOMEN ULTRASOUND FOR ASCITES  TECHNIQUE: Limited ultrasound survey for ascites was performed in all four abdominal quadrants.  COMPARISON:  03/02/2014 CT  FINDINGS: There is no evidence of ascites.  IMPRESSION: No evidence of ascites.   Electronically Signed   By: Hassan Rowan M.D.   On: 03/18/2014 15:31   Ir Fluoro Guide Cv Line Right  03/24/2014   CLINICAL DATA:  Chronic heart failure and need for tunneled central venous catheter for chronic milrinone infusion therapy.  EXAM: TUNNELED CENTRAL VENOUS CATHETER PLACEMENT WITH ULTRASOUND AND FLUOROSCOPIC GUIDANCE  ANESTHESIA/SEDATION: 1.0 mg IV Versed; 50 mcg IV Fentanyl.  Total Moderate Sedation Time  35 minutes.  MEDICATIONS: No additional medications.  FLUOROSCOPY TIME:  12 seconds.  PROCEDURE: The procedure, risks, benefits, and alternatives were explained to the patient. Questions regarding the procedure were encouraged and answered. The patient understands and consents to the procedure.  The right neck and chest were prepped with chlorhexidine in a sterile fashion, and a sterile drape was applied covering the operative field. Maximum barrier sterile technique with sterile gowns and gloves were used for the procedure. Local anesthesia was provided with 1% lidocaine.  Ultrasound was used to confirm patency of the right internal jugular vein. After creating a small venotomy incision, a 21 gauge needle was  advanced into the right internal jugular vein under direct, real-time ultrasound guidance. Ultrasound image documentation was performed. After securing guidewire access, an 6 French peel-away sheath was placed. A wire was kinked to measure appropriate catheter length.  A 6 Fr dual lumen Power Line tunneled catheter measuring 25 cm from tip to cuff was chosen for placement. This was tunneled in a retrograde  fashion from the chest wall to the venotomy incision.  The catheter was then placed through the sheath and the sheath removed. Final catheter positioning was confirmed and documented with a fluoroscopic spot image. The catheter was aspirated and flushed with saline  The venotomy incision was closed with subcuticular 4-0 Vicryl. Dermabond was applied to the incision. The catheter exit site was secured with 0-Prolene retention sutures.  COMPLICATIONS: None.  No pneumothorax.  FINDINGS: After catheter placement, the tip lies at the cavoatrial junction. The catheter aspirates normally and is ready for immediate use.  IMPRESSION: Placement of tunneled central venous catheter via the right internal jugular vein. The catheter tip lies at the cavoatrial junction. The catheter is ready for immediate use.   Electronically Signed   By: Aletta Edouard M.D.   On: 03/24/2014 16:56   Ir Fluoro Guide Cv Line Right  03/14/2014   CLINICAL DATA:  80 year old gentleman with chronic no lower known requirement. He has been referred for replacement of right withdrawn PICC  EXAM: IR RIGHT FLOURO GUIDE CV LINE  Date: 03/14/2014  ANESTHESIA/SEDATION: The patient's vital signs were monitored continuously by radiology nursing throughout the course of the procedure.  Total sedation time: 0 minutes  FLUOROSCOPY TIME:  42 seconds  TECHNIQUE: The risks and benefits of the procedure were discussed with the patient with the assistance of a translator given the patient's primary language is [place]. The patient was offered a full informed  consent. He wishes to proceed.  The right upper arm, including the indwelling PICC, was prepped with chlorhexidine, and draped in the usual sterile fashion using maximum barrier technique (cap and mask, sterile gown, sterile gloves, large sterile sheet, hand hygiene and cutaneous antiseptic). Local anesthesia was attained by infiltration with 1% lidocaine with epinephrine.  The indwelling PICC was withdrawn to the subclavian/axillary vein under fluoroscopy and then the catheter was cut at the skin. A 0.018 wire was advanced under fluoroscopy into the heart. The rib catheter will remnant was then removed over the wire.  Peel-away sheath was advanced over the wire and into the vein, and then the 0.018 wire was used to measure the length of the require PICC. 35 cm was estimated.  The new 35 cm double-lumen power injectable PICC was advanced with a coaxial wire to the right atrium. Approximately 1 cm of catheter was present between the skin and the hub of the catheter. The catheter was aspirated and flushed with saline.  The catheter was then sutured in position with 2 Prolene sutures.  The patient tolerated the procedure well and remained hemodynamically stable throughout.  COMPLICATIONS: None.  The patient tolerated the procedure well.  IMPRESSION: Status post exchange of right upper extremity PICC, which had been withdrawn. A new, power injectable, double-lumen PICC measuring 35 cm was placed, with the tip at the right atrium. The catheter is ready for use.  Approximately 1 cm catheter is present between the skin and the hub of the catheter, where the bacteriostatic dressing pad was placed.  Signed,  Dulcy Fanny. Earleen Newport, DO  [facility]  [facility]   Electronically Signed   By: Corrie Mckusick O.D.   On: 03/14/2014 11:19   Ir US Guide Vasc Access Right  03/24/2014   CLINICAL DATA:  Chronic heart failure and need for tunneled central venous catheter for chronic milrinone  infusion therapy.  EXAM: TUNNELED CENTRAL VENOUS CATHETER PLACEMENT WITH ULTRASOUND AND FLUOROSCOPIC GUIDANCE  ANESTHESIA/SEDATION: 1.0 mg IV Versed; 50 mcg IV  Fentanyl.  Total Moderate Sedation Time  35 minutes.  MEDICATIONS: No additional medications.  FLUOROSCOPY TIME:  12 seconds.  PROCEDURE: The procedure, risks, benefits, and alternatives were explained to the patient. Questions regarding the procedure were encouraged and answered. The patient understands and consents to the procedure.  The right neck and chest were prepped with chlorhexidine in a sterile fashion, and a sterile drape was applied covering the operative field. Maximum barrier sterile technique with sterile gowns and gloves were used for the procedure. Local anesthesia was provided with 1% lidocaine.  Ultrasound was used to confirm patency of the right internal jugular vein. After creating a small venotomy incision, a 21 gauge needle was advanced into the right internal jugular vein under direct, real-time ultrasound guidance. Ultrasound image documentation was performed. After securing guidewire access, an 6 French peel-away sheath was placed. A wire was kinked to measure appropriate catheter length.  A 6 Fr dual lumen Power Line tunneled catheter measuring 25 cm from tip to cuff was chosen for placement. This was tunneled in a retrograde fashion from the chest wall to the venotomy incision.  The catheter was then placed through the sheath and the sheath removed. Final catheter positioning was confirmed and documented with a fluoroscopic spot image. The catheter was aspirated and flushed with saline  The venotomy incision was closed with subcuticular 4-0 Vicryl. Dermabond was applied to the incision. The catheter exit site was secured with 0-Prolene retention sutures.  COMPLICATIONS: None.  No pneumothorax.  FINDINGS: After catheter placement, the tip lies at the cavoatrial junction. The catheter aspirates normally and is ready for immediate  use.  IMPRESSION: Placement of tunneled central venous catheter via the right internal jugular vein. The catheter tip lies at the cavoatrial junction. The catheter is ready for immediate use.   Electronically Signed   By: Aletta Edouard M.D.   On: 03/24/2014 16:56   Dg Chest Port 1 View  03/24/2014   CLINICAL DATA:  Shortness of breath. History of hypertension and CHF. History of atrial fibrillation. Subsequent encounter.  EXAM: PORTABLE CHEST - 1 VIEW  COMPARISON:  03/16/2014.  FINDINGS: Right IJ sheath in cardiac pacer in stable position. Cardiomegaly with pulmonary vascular prominence diffuse bilateral pulmonary infiltrates noted consistent congestive heart failure with pulmonary edema. Similar findings noted on prior study. Small pleural effusions cannot be excluded. No pneumothorax.  IMPRESSION: 1. Right IJ sheath in stable position. 2. Congestive heart failure with pulmonary edema, no significant change from prior exam .   Electronically Signed   By: [place]   On: 03/24/2014 16:23   Dg Chest Portable 1 View  03/16/2014   CLINICAL DATA:  Fever.  CHF.  Fluids given.  EXAM: PORTABLE CHEST - 1 VIEW  COMPARISON:  03/16/2014  FINDINGS: Cardiac pacemaker. Left PICC catheter is unchanged in position. Shallow inspiration. Cardiac enlargement with increased pulmonary vascularity and perihilar interstitial edema. Edema pattern is slightly progressed since previous study. No blunting of costophrenic angles. No pneumothorax.  IMPRESSION: Cardiac enlargement with pulmonary vascular congestion and increasing interstitial edema since previous study.   Electronically Signed   By: Lucienne Capers M.D.   On: 03/16/2014 21:26   Dg Chest Port 1 View  03/16/2014   CLINICAL DATA:  Fever.  EXAM: PORTABLE CHEST - 1 VIEW  COMPARISON:  March 13, 2014.  FINDINGS: Stable cardiomegaly. Left-sided pacemaker is unchanged in position. Right-sided PICC line is noted with distal tip in the expected position the SVC. Mild  central pulmonary vascular  congestion and perihilar edema is noted. No pneumothorax or pleural effusion is noted. Bony thorax is intact.  IMPRESSION: Mild central pulmonary vascular congestion and mild perihilar edema is noted bilaterally.   Electronically Signed   By: Sabino Dick M.D.   On: 03/16/2014 17:38   Dg Abd Portable 1v  03/16/2014   CLINICAL DATA:  Nausea/vomiting  EXAM: PORTABLE ABDOMEN - 1 VIEW  COMPARISON:  03/06/2014  FINDINGS: Nonspecific but nonobstructive bowel gas pattern.  Stomach is mild distended.  Degenerative changes of the lumbar spine.  IMPRESSION: Mild gastric distention.  Otherwise unremarkable abdominal radiograph.   Electronically Signed   By: Julian Hy M.D.   On: 03/16/2014 21:42    Microbiology: No results found for this or any previous visit (from the past 240 hour(s)).   Labs: Basic Metabolic Panel:  Recent Labs Lab 03/31/14 0450 04/01/14 0425 04/02/14 0601 04/03/14 0536 04/04/14 0505  NA 141 142 142 140 142  K 3.4* 3.9 3.7 3.6* 3.9  CL 90* 92* 92* 90* 93*  CO2 39* 39* 39* 35* 34*  GLUCOSE 127* 122* 120* 164* 139*  BUN 39* 40* 41* 44* 49*  CREATININE 1.62* 1.56* 1.58* 1.65* 1.79*  CALCIUM 9.1 9.0 9.0 9.1 9.1   Liver Function Tests: No results found for this basename: AST, ALT, ALKPHOS, BILITOT, PROT, ALBUMIN,  in the last 168 hours No results found for this basename: LIPASE, AMYLASE,  in the last 168 hours No results found for this basename: AMMONIA,  in the last 168 hours CBC:  Recent Labs Lab 03/30/14 0500  WBC 14.2*  HGB 10.1*  HCT 31.5*  MCV 97.8  PLT 312   Cardiac Enzymes: No results found for this basename: CKTOTAL, CKMB, CKMBINDEX, TROPONINI,  in the last 168 hours BNP: BNP (last 3 results)  Recent Labs  11/07/13 0708 03/01/14 1959 03/08/14 2230  PROBNP 9356.0* 11880.0* 15334.0*   CBG: No results found for this basename: GLUCAP,  in the last 168 hours     Signed:  Charlynne Cousins  [facility] 04/04/2014, 8:51 AM

## 2014-04-04 NOTE — Progress Notes (Signed)
ANTICOAGULATION CONSULT NOTE - Follow Up Consult  Pharmacy Consult for Coumadin  Indication: atrial fibrillation   Allergies  Allergen Reactions  . Lisinopril Rash    Patient Measurements: Height: 5\' 8"  (172.7 cm) Weight: 163 lb 14.4 oz (74.345 kg) IBW/kg (Calculated) : 68.4  Vital Signs: Temp: 97.9 F (36.6 C) (10/16 0641) Temp Source: Oral (10/16 0641) BP: 102/46 mmHg (10/16 0641) Pulse Rate: 90 (10/16 0641)  Labs:  Recent Labs  04/02/14 0601 04/03/14 0536 04/04/14 0505  LABPROT 19.7* 21.6* 23.7*  INR 1.67* 1.86* 2.10*  CREATININE 1.58* 1.65* 1.79*    Estimated Creatinine Clearance: 32.4 ml/min (by C-G formula based on Cr of 1.79).  Assessment: 80yo male continues on chronic coumadin for h/o afib. Also continues on amiodarone (on PTA). INR within goal for >7 days on 2- 3 mg daily, however, INR continues to improve and is now therapeutic; INR 1.84>>1.67>>1.86>2.1. Patient not reporting diarrhea or eating dark greens, but was recently started on Ensure TID which  contributed to drop in INR.  Home dose: 4mg  daily except 2mg  Friday and Sunday   Goal of Therapy:  INR 2-3 Monitor platelets by anticoagulation protocol: Yes   Plan:  1) Coumadin 4mg  x 1 2) INR in AM 3) Resume home dose at discharge starting tomorrow  Erin Hearing PharmD., BCPS Clinical Pharmacist Pager [phone] 04/04/2014 9:01 AM

## 2014-04-05 ENCOUNTER — Other Ambulatory Visit: Payer: Self-pay | Admitting: Internal Medicine

## 2014-04-05 LAB — BASIC METABOLIC PANEL
Anion gap: 13 (ref 5–15)
BUN: 57 mg/dL — ABNORMAL HIGH (ref 6–23)
CO2: 37 mEq/L — ABNORMAL HIGH (ref 19–32)
Calcium: 9 mg/dL (ref 8.4–10.5)
Chloride: 91 mEq/L — ABNORMAL LOW (ref 96–112)
Creatinine, Ser: 1.92 mg/dL — ABNORMAL HIGH (ref 0.50–1.35)
GFR calc Af Amer: 37 mL/min — ABNORMAL LOW (ref >90)
GFR calc non Af Amer: 32 mL/min — ABNORMAL LOW (ref >90)
GLUCOSE: 139 mg/dL — AB (ref 70–99)
POTASSIUM: 3.2 meq/L — AB (ref 3.7–5.3)
Sodium: 141 mEq/L (ref 137–147)

## 2014-04-05 LAB — PROTIME-INR
INR: 2.43 — ABNORMAL HIGH (ref 0.00–1.49)
Prothrombin Time: 26.6 seconds — ABNORMAL HIGH (ref 11.6–15.2)

## 2014-04-05 MED ORDER — HEPARIN SOD (PORK) LOCK FLUSH 100 UNIT/ML IV SOLN
250.0000 [IU] | INTRAVENOUS | Status: AC | PRN
  Administered 2014-04-05: 250 [IU]

## 2014-04-05 NOTE — Progress Notes (Signed)
Pt discharged to home. Pt. Is alert and oriented. Pt is hemodynamically stable. [facility] nurse verbalized to RN that patient's medication was connected and patient was ready to d/c. Oxygen brought to patient for discharge. Discharge plan appropriate and in place.

## 2014-04-05 NOTE — Progress Notes (Signed)
Day charge spoke with niece concerning AVS. RN forgot to have patient sign it. Niece stated that she would come back and signed the AVS after dinner. Niece has yet to come by. Night charge notified.

## 2014-04-05 NOTE — Progress Notes (Signed)
[facility] PROGRESS NOTE Interim History: Tyler Christensen is a 80 y.o. male w/ PMHx significant for NICM with EF of 20% on chronic milrinone, HTN, pulm HTN, chronic renal insufficiency (baseline CR 1.8), liposarcoma  and PAF. Recently discharged 9/18 after AKI. Discharged weight was 160 lbs. Last week about PICC line being out 7 cm and was scheduled for PICC exchange which he underwent (9/24). Presented to the ED 9/27 with fevers 103 and erythema around PICC. Concern for sepsis and PICC exchanged. CT abdomen (9/28): increasing infiltration and atelectasis in the lung bases possible PNA. PICC discontinued 9/27. BCX MSSA x 2. Echo EF 20%. No obvious vegetation. Surveillance bcx remain negative. Completed course antibiotic in house 12.12.2015.Remains on milrinone 0.375 mcg. Volume status remains elevated and weight up 4 lbs, 24 hr I/O -1.3 liters.   Filed Weights   04/03/14 0602 04/04/14 0641 04/05/14 0453  Weight: 75.4 kg (166 lb 3.6 oz) 74.345 kg (163 lb 14.4 oz) 74.934 kg (165 lb 3.2 oz)        Intake/Output Summary (Last 24 hours) at 04/05/14 0951 Last data filed at 04/05/14 0856  Gross per 24 hour  Intake   1151 ml  Output    925 ml  Net    226 ml     Assessment/Plan: Acute respiratory failure with hypoxia due to acute on chronic systolic heart failure:  - Secondary to acute on chronic CHF exacerbation. - Patient currently on milrinone, metolazone and torsemide, hard to diurese. - Still overloaded, cr increase today, hold diuretics. Resume as an outpatient on 10.18.2015. - home today with wife, refused SNF and hospice will need to readdress treatment manegement as an outpatient.    Code Status: full  Family Communication: wife  Disposition Plan: inpatient    Consultants:  ID  cardiology  Procedures: ECHO: does not show abnormality  Antibiotics:  HPI/Subjective: Feels better.  Objective: Filed Vitals:   04/04/14 2129 04/05/14 0453 04/05/14 0825 04/05/14 0843    BP:  96/44 95/46   Pulse: 82 82 85   Temp:  97.2 F (36.2 C)    TempSrc:  Axillary    Resp: 20 18 18    Height:      Weight:  74.934 kg (165 lb 3.2 oz)    SpO2: 94% 97%  96%     Exam:  General: Alert, awake, oriented x3, in no acute distress.  HEENT: No bruits, no goiter.  Heart: Regular rate and rhythm. Lungs: Good air movement, clear Abdomen: Soft, nontender, distended, positive bowel sounds.    Data Reviewed: Basic Metabolic Panel:  Recent Labs Lab 04/01/14 0425 04/02/14 0601 04/03/14 0536 04/04/14 0505 04/05/14 0520  NA 142 142 140 142 141  K 3.9 3.7 3.6* 3.9 3.2*  CL 92* 92* 90* 93* 91*  CO2 39* 39* 35* 34* 37*  GLUCOSE 122* 120* 164* 139* 139*  BUN 40* 41* 44* 49* 57*  CREATININE 1.56* 1.58* 1.65* 1.79* 1.92*  CALCIUM 9.0 9.0 9.1 9.1 9.0   Liver Function Tests: No results found for this basename: AST, ALT, ALKPHOS, BILITOT, PROT, ALBUMIN,  in the last 168 hours No results found for this basename: LIPASE, AMYLASE,  in the last 168 hours No results found for this basename: AMMONIA,  in the last 168 hours CBC:  Recent Labs Lab 03/30/14 0500  WBC 14.2*  HGB 10.1*  HCT 31.5*  MCV 97.8  PLT 312   Cardiac Enzymes: No results found for this basename: CKTOTAL, CKMB, CKMBINDEX, TROPONINI,  in  the last 168 hours BNP (last 3 results)  Recent Labs  11/07/13 0708 03/01/14 1959 03/08/14 2230  PROBNP 9356.0* 11880.0* 15334.0*   CBG: No results found for this basename: GLUCAP,  in the last 168 hours  No results found for this or any previous visit (from the past 240 hour(s)).   Studies: No results found.  Scheduled Meds: . acidophilus  2 capsule Oral Daily  . amiodarone  200 mg Oral BID  . atorvastatin  20 mg Oral q1800  . budesonide (PULMICORT) nebulizer solution  0.25 mg Nebulization BID  . loratadine  10 mg Oral Daily  . magnesium oxide  400 mg Oral Daily  . potassium chloride SA  40 mEq Oral BID  . sildenafil  20 mg Oral TID  . sodium  chloride  10-40 mL Intracatheter Q12H  . sodium chloride  3 mL Intravenous Q12H  . Warfarin - Pharmacist Dosing Inpatient   Does not apply q1800   Continuous Infusions: . sodium chloride 10 mL/hr at 03/31/14 2257  . milrinone 0.375 mcg/kg/min (04/05/14 0628)     Charlynne Cousins  [facility] Pager [phone] If 8PM-8AM, please contact night-coverage at [email or web address], password [facility] 04/05/2014, 9:51 AM  LOS: 20 days

## 2014-04-05 NOTE — Progress Notes (Signed)
SATURATION QUALIFICATIONS: (This note is used to comply with regulatory documentation for home oxygen)  Patient Saturations on Room Air at Rest = 88%  Patient Saturations on Room Air while Ambulating = 80%  Patient Saturations on 3Liters of oxygen while Ambulating = 91%  Please briefly explain why patient needs home oxygen:  Patient is 88% at rest without o2 and drops down to 80 when ambulated. Patient o2 stat only came back up when he was put back on o2. Will need o2 when patient leaves. MD has seen patient and notified concerning o2 stats.

## 2014-04-05 NOTE — Progress Notes (Signed)
[id number] Heart Failure Rounding Note   Subjective:    [name] is a 80 y.o. male w/ PMHx significant for NICM with EF of 20% on chronic milrinone, HTN, pulm HTN, chronic renal insufficiency (baseline CR 1.8), retroperitoneal mass and PAF.  Just recently discharged 9/18 after AKI. Discharged weight was 160 lbs. Called last week about PICC line being out 7 cm and was scheduled for PICC exchange which he underwent (9/24).  Presented to the ED 9/27 with fevers 103 and erythema around PICC. Concern for sepsis and PICC exchanged. CT abdomen (9/28): increasing infiltration and atelectasis in the lung bases possible PNA. PICC discontinued 9/27. BCX MSSA x 2. Echo EF 20%. No obvious vegetation. Surveillance bcx remain negative.   Remains on milrinone 0.375 mcg. Switched to torsemide 80 mg BID and metolazone 5 mg BID. WEight up 2 lbs. I/Os even but creatinine up    Objective:   Weight Range:  Vital Signs:   Temp:  [96.6 F (35.9 C)-97.8 F (36.6 C)] 97.2 F (36.2 C) (10/17 0453) Pulse Rate:  [81-92] 85 (10/17 0825) Resp:  [18-20] 18 (10/17 0825) BP: (81-105)/(44-56) 95/46 mmHg (10/17 0825) SpO2:  [94 %-97 %] 97 % (10/17 0453) Weight:  [74.934 kg (165 lb 3.2 oz)] 74.934 kg (165 lb 3.2 oz) (10/17 0453) Last BM Date: 04/01/14  Weight change: Filed Weights   04/03/14 0602 04/04/14 0641 04/05/14 0453  Weight: 75.4 kg (166 lb 3.6 oz) 74.345 kg (163 lb 14.4 oz) 74.934 kg (165 lb 3.2 oz)    Intake/Output:   Intake/Output Summary (Last 24 hours) at 04/05/14 0828 Last data filed at 04/05/14 0458  Gross per 24 hour  Intake   1141 ml  Output   1025 ml  Net    116 ml     Physical Exam: General:  Elderly appearing. No resp difficulty, on 2L [place] Sitting in chair.  HEENT: normal Neck: supple. JVP 7 Carotids 2+ bilat; no bruits. No lymphadenopathy or thryomegaly appreciated.  Cor: PMI laterally displaced + S3 R upper chest PICC placed. Distant regular Lungs: Lungs clear. Abdomen:  nontender,  No hepatosplenomegaly. No bruits or masses. Good bowel sounds. Extremities: no cyanosis, clubbing, rash, R and LLE 2+ edema. UNNA boots intact Neuro: alert & orientedx3, cranial nerves grossly intact. moves all 4 extremities w/o difficulty. Affect pleasant  Telemetry: V paced 90s  Labs: Basic Metabolic Panel:  Recent Labs Lab 04/01/14 0425 04/02/14 0601 04/03/14 0536 04/04/14 0505 04/05/14 0520  NA 142 142 140 142 141  K 3.9 3.7 3.6* 3.9 3.2*  CL 92* 92* 90* 93* 91*  CO2 39* 39* 35* 34* 37*  GLUCOSE 122* 120* 164* 139* 139*  BUN 40* 41* 44* 49* 57*  CREATININE 1.56* 1.58* 1.65* 1.79* 1.92*  CALCIUM 9.0 9.0 9.1 9.1 9.0    Liver Function Tests: No results found for this basename: AST, ALT, ALKPHOS, BILITOT, PROT, ALBUMIN,  in the last 168 hours No results found for this basename: LIPASE, AMYLASE,  in the last 168 hours No results found for this basename: AMMONIA,  in the last 168 hours  CBC:  Recent Labs Lab 03/30/14 0500  WBC 14.2*  HGB 10.1*  HCT 31.5*  MCV 97.8  PLT 312    Cardiac Enzymes: No results found for this basename: CKTOTAL, CKMB, CKMBINDEX, TROPONINI,  in the last 168 hours  BNP: BNP (last 3 results)  Recent Labs  11/07/13 0708 03/01/14 1959 03/08/14 2230  PROBNP 9356.0* 11880.0* 15334.0*     Other  results:  EKG:   Imaging: No results found.   Medications:     Scheduled Medications: . acidophilus  2 capsule Oral Daily  . amiodarone  200 mg Oral BID  . atorvastatin  20 mg Oral q1800  . budesonide (PULMICORT) nebulizer solution  0.25 mg Nebulization BID  . loratadine  10 mg Oral Daily  . magnesium oxide  400 mg Oral Daily  . metolazone  5 mg Oral BID  . potassium chloride SA  40 mEq Oral BID  . sildenafil  20 mg Oral TID  . sodium chloride  10-40 mL Intracatheter Q12H  . sodium chloride  3 mL Intravenous Q12H  . torsemide  80 mg Oral BID  . Warfarin - Pharmacist Dosing Inpatient   Does not apply q1800     Infusions: . sodium chloride 10 mL/hr at 03/31/14 2257  . 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 I think we should hold today and send home on demadex 80 daily and metolazone 5 daily. We will follow closely in HF clinic  Daniel Bensimhon,MD 8:28 AM

## 2014-04-05 NOTE — Progress Notes (Signed)
ANTICOAGULATION CONSULT NOTE - Follow Up Consult  Pharmacy Consult for Coumadin Indication: atrial fibrillation  Allergies  Allergen Reactions  . Lisinopril Rash    Patient Measurements: Height: 5\' 8"  (172.7 cm) Weight: 165 lb 3.2 oz (74.934 kg) (scale c) IBW/kg (Calculated) : 68.4 Heparin Dosing Weight:   Vital Signs: Temp: 97.2 F (36.2 C) (10/17 0453) Temp Source: Axillary (10/17 0453) BP: 103/53 mmHg (10/17 1051) Pulse Rate: 86 (10/17 1051)  Labs:  Recent Labs  04/03/14 0536 04/04/14 0505 04/05/14 0520  LABPROT 21.6* 23.7* 26.6*  INR 1.86* 2.10* 2.43*  CREATININE 1.65* 1.79* 1.92*    Estimated Creatinine Clearance: 30.2 ml/min (by C-G formula based on Cr of 1.92).   Medications:  Scheduled:  . acidophilus  2 capsule Oral Daily  . amiodarone  200 mg Oral BID  . atorvastatin  20 mg Oral q1800  . budesonide (PULMICORT) nebulizer solution  0.25 mg Nebulization BID  . loratadine  10 mg Oral Daily  . magnesium oxide  400 mg Oral Daily  . potassium chloride SA  40 mEq Oral BID  . sildenafil  20 mg Oral TID  . sodium chloride  10-40 mL Intracatheter Q12H  . sodium chloride  3 mL Intravenous Q12H  . Warfarin - Pharmacist Dosing Inpatient   Does not apply q1800    Assessment: 80yo male with AFib.  INR 2.43, plan d/c home today on previous home dose of 4mg  daily x 2mg  Fri, Sun.  No bleeding noted.  Goal of Therapy:  INR 2-3 Monitor platelets by anticoagulation protocol: Yes   Plan:  Coumadin 4mg  if still here this PM   Gracy Bruins, PharmD Clinical Pharmacist [facility]

## 2014-04-07 ENCOUNTER — Ambulatory Visit (INDEPENDENT_AMBULATORY_CARE_PROVIDER_SITE_OTHER): Payer: Medicare Other | Admitting: Cardiology

## 2014-04-07 ENCOUNTER — Telehealth (HOSPITAL_COMMUNITY): Payer: Self-pay | Admitting: Vascular Surgery

## 2014-04-07 DIAGNOSIS — I4891 Unspecified atrial fibrillation: Secondary | ICD-10-CM

## 2014-04-07 LAB — POCT INR: INR: 5.1

## 2014-04-07 LAB — PROTIME-INR: INR: 4 — AB (ref <=1.1)

## 2014-04-07 NOTE — Telephone Encounter (Signed)
Spoke w/[name] she states o2 sat dropped while was up to ambulate but recovered to 93% with rest, she states she spent about 2 hours at pt's home going over how to refill oxygen tanks and organizing meds for him, she states all of his meds have been placed in a 4 times a day pill box, she states his abd and ankles are swollen wt 162 lb today, denies extra SOB however she states when he came back from the bathroom he seemed out of breathe to her, she states he is requesting a hospital bed and an PT eval gave verbal orders for both, she states pt feels comfortable today just tired, he is scheduled to see [name] on Thursday and she will be back to see him again on Wed on Fri for milrinone bag changes

## 2014-04-07 NOTE — Telephone Encounter (Signed)
Nurse called pt O2 stat 76% on 2 liters of O2, PT WAS SENT HOME WITH A BOOT... Nurse feels the pt needs compression stockings not the boot.. Please advise

## 2014-04-09 ENCOUNTER — Ambulatory Visit (INDEPENDENT_AMBULATORY_CARE_PROVIDER_SITE_OTHER): Payer: Medicare Other | Admitting: Cardiology

## 2014-04-09 DIAGNOSIS — I4891 Unspecified atrial fibrillation: Secondary | ICD-10-CM

## 2014-04-09 LAB — POCT INR: INR: 3.7

## 2014-04-10 ENCOUNTER — Encounter (HOSPITAL_COMMUNITY): Payer: Self-pay

## 2014-04-10 ENCOUNTER — Ambulatory Visit (HOSPITAL_COMMUNITY)
Admit: 2014-04-10 | Discharge: 2014-04-10 | Disposition: A | Discharge status: Home / Self Care | Payer: Medicare Other | Attending: Internal Medicine | Admitting: Internal Medicine

## 2014-04-10 ENCOUNTER — Encounter (HOSPITAL_COMMUNITY): Payer: Medicare Other

## 2014-04-10 VITALS — BP 102/72 | HR 71 | Resp 20 | Wt 157.5 lb

## 2014-04-10 DIAGNOSIS — I10 Essential (primary) hypertension: Secondary | ICD-10-CM

## 2014-04-10 DIAGNOSIS — G40909 Epilepsy, unspecified, not intractable, without status epilepticus: Secondary | ICD-10-CM | POA: Diagnosis not present

## 2014-04-10 DIAGNOSIS — I452 Bifascicular block: Secondary | ICD-10-CM | POA: Diagnosis not present

## 2014-04-10 DIAGNOSIS — Z7901 Long term (current) use of anticoagulants: Secondary | ICD-10-CM | POA: Insufficient documentation

## 2014-04-10 DIAGNOSIS — I480 Paroxysmal atrial fibrillation: Secondary | ICD-10-CM | POA: Diagnosis not present

## 2014-04-10 DIAGNOSIS — Z8673 Personal history of transient ischemic attack (TIA), and cerebral infarction without residual deficits: Secondary | ICD-10-CM | POA: Diagnosis not present

## 2014-04-10 DIAGNOSIS — I5022 Chronic systolic (congestive) heart failure: Secondary | ICD-10-CM | POA: Insufficient documentation

## 2014-04-10 DIAGNOSIS — I272 Other secondary pulmonary hypertension: Secondary | ICD-10-CM | POA: Insufficient documentation

## 2014-04-10 DIAGNOSIS — I252 Old myocardial infarction: Secondary | ICD-10-CM | POA: Insufficient documentation

## 2014-04-10 DIAGNOSIS — Z66 Do not resuscitate: Secondary | ICD-10-CM | POA: Insufficient documentation

## 2014-04-10 DIAGNOSIS — I447 Left bundle-branch block, unspecified: Secondary | ICD-10-CM | POA: Insufficient documentation

## 2014-04-10 DIAGNOSIS — N183 Chronic kidney disease, stage 3 unspecified: Secondary | ICD-10-CM

## 2014-04-10 DIAGNOSIS — I129 Hypertensive chronic kidney disease with stage 1 through stage 4 chronic kidney disease, or unspecified chronic kidney disease: Secondary | ICD-10-CM | POA: Diagnosis not present

## 2014-04-10 DIAGNOSIS — Z79891 Long term (current) use of opiate analgesic: Secondary | ICD-10-CM | POA: Insufficient documentation

## 2014-04-10 DIAGNOSIS — Z79899 Other long term (current) drug therapy: Secondary | ICD-10-CM | POA: Insufficient documentation

## 2014-04-10 DIAGNOSIS — I2721 Secondary pulmonary arterial hypertension: Secondary | ICD-10-CM

## 2014-04-10 MED ORDER — SORBITOL 70 % SOLN: 30.0000 mL | Freq: Every day | Status: DC | PRN

## 2014-04-10 NOTE — Progress Notes (Addendum)
Patient ID: Tyler Christensen, male   DOB: 13-Dec-1932, 80 y.o.   MRN: [id number]  Interpreter: Tyler Christensen  [employer]  HPI: Tyler Christensen is a 80 y.o. [name] (non-english speaking) w/ PMHx significant for chronic systolic CHF (EF 54%), NICM, LBBB, PAF (on coumadin), HTN, HLD, CVA, Pulmonary Hypertension and h/o seizure d/o (2/2 CVA 05/2012).   Admitted to [facility] 06/2012 with acute decompensated heart failure EF 20%. As noted below, also diagnosed with severe pulmonary hypertension (PAPs ~ 90) from initial RHC and placed on Milrinone and Revatio (eventually transitioned to tadalafil 40) with marked clinical response and decrease in PA pressures. VQ low probability for pulmonary embolus.  Discharged on Milrinone at 0.375 mcg via PICC. Discharge Weight 146 pounds. Attempted to wean milrinone in 9/14 but failed due to worsening HF and PAH. Milrinone restarted.  Admitted to the hospital 5/21-11/22/13. Found to have staph aureus bacteremia treated with IV abx. TEE negative for endocarditis. Also found to retroperitoneal mass and biopsy revealed benign lesion. He had evidence of low output thought to be due to atrial fibrillation and milrinone increased. Attempted TEE and DC-CV but had LAA clot so deferred cardioversion.  [facility] Follow up for Heart Failure: Feeling a little better. Weight at home 156-163 lbs. Reports breathing is somewhat better and still gets SOB sometimes. Denies CP, LE edema or PND. +orthopnea (3 pillows). Taking all medications. Following a low salt diet and drinking less than 2L a day. Eating soup at home that he reports is broth and does not have salt in it, he makes from scratch. Having trouble with bowel movements and think he is having constipation. No belly pain. No extra diuretics. Walking a little bit in the house.   NO ACE-I 2/2 rash   08/14/12 S/P successful DC-CV of AF 10/26/12 S/P BiV-ICD implant 01/07/13 ECHO EF 20%, restrictive diastolic function, mildly decreased RV size  and systolic fxn, mild to moderate MR, PA systolic pressure 59 mmHg  02/21/13 ECHO EF 20% PA pressure mean 59 11/12/13: ECHO EF 20-25%, trivial AI,     RHC 02/21/13 (off milrinone) RA = 9 with v-waves to 20  RV = 72/8/20  PA = 69/27 (44)  PCW = 18-20  Fick cardiac output/index = 2.7/1.5  PVR = 9.6 woods  FA sat = 96%  PA sat = 48%, 52%  Labs      (06/25/13) K 4.2 Creatinine 1.58 Magnesium 2.0     (07/16/13) K 4.2, Cr 1.67    (2/15) creatinine 1.97 => 1.94, K 4.6, HCT 39.4    (4/15) K 4.5, creatinine 1.56, pro-BNP 6912    (4/15) K 3.9, creatinine 2.39, mag 2.1     (09/2013) K 3.7, creatinine 2.03     11/26/13 K 4.6 cr 2.8 hgb 13.2     12/11/13 K 3.4 Creatinine 2.14  Magnesium 2.1     12/19/13: K+ 3.4, creatinine 2.42, BUN 57     01/14/14: K 3.8, creatinine 2.45, BUN 45, mag 2.1    SH: Lives with his wife here in [place].   ROS: All systems negative except as listed in HPI, PMH and Problem List.  Past Medical History  Diagnosis Date  . HTN (hypertension)   . High cholesterol   . CHF (congestive heart failure)   . BBBB (bilateral bundle branch block)   . LBBB (left bundle branch block)   . Seizures 06/11/2012    new onset/notes (06/11/2012)  . SOB (shortness of breath)     "sometimes when  I lay down;; related to not taking my RX" (06/11/2012)  . Myocardial infarction     06/10/2012  . Atrial fibrillation   . Stroke   . Atrial thrombus     left  . Pulmonary HTN     Current Outpatient Prescriptions  Medication Sig Dispense Refill  . amiodarone (PACERONE) 200 MG tablet Take 1 tablet (200 mg total) by mouth 2 (two) times daily.  60 tablet  6  . atorvastatin (LIPITOR) 20 MG tablet Take 1 tablet (20 mg total) by mouth daily at 6 PM.  30 tablet  6  . loratadine (CLARITIN) 10 MG tablet Take 10 mg by mouth daily.      . magnesium oxide (MAG-OX) 400 MG tablet Take 400 mg by mouth daily.      . metolazone (ZAROXOLYN) 5 MG tablet Take 1 tablet (5 mg total) by mouth daily.  60 tablet  0   . milrinone (PRIMACOR) 20 MG/100ML SOLN infusion Inject 26.775 mcg/min into the vein continuous.  100 mL  6  . oxyCODONE 10 MG TABS Take 1 tablet (10 mg total) by mouth every 4 (four) hours as needed for severe pain.  30 tablet  0  . potassium chloride SA (K-DUR,KLOR-CON) 20 MEQ tablet Take 1 tablet (20 mEq total) by mouth daily.  30 tablet  3  . senna-docusate (SENOKOT-S) 8.6-50 MG per tablet Take 1 tablet by mouth at bedtime.  30 tablet  3  . sildenafil (REVATIO) 20 MG tablet Take 1 tablet (20 mg total) by mouth 3 (three) times daily.  90 tablet  3  . torsemide (DEMADEX) 20 MG tablet Take 4 tablets (80 mg total) by mouth 2 (two) times daily.  60 tablet  0  . traMADol (ULTRAM) 50 MG tablet Take 50 mg by mouth every 12 (twelve) hours as needed for moderate pain.      Marland Kitchen warfarin (COUMADIN) 4 MG tablet Take 2-4 mg by mouth daily. 4mg  daily except Friday and Sunday takes 2mg       . sorbitol 70 % SOLN Take 30 mLs by mouth daily as needed for moderate constipation.  500 mL  3   No current facility-administered medications for this encounter.    Filed Vitals:   04/10/14 1042  BP: 102/72  Pulse: 71  Resp: 20  Weight: 157 lb 8 oz (71.442 kg)  SpO2: 98%   PHYSICAL EXAM: General:  Elderly, chronically ill appearing. NAD.No resp difficulty; interpreter present HEENT: normal Neck: supple. JVD 7-8; Carotids 2+ bilaterally; no bruits. No lymphadenopathy or thryomegaly appreciated. RIJ 2L Picc in chest Cor: PMI laterally displaced. Regular rhythm and rate. No s3 or murmur.  Lungs: clear Abdomen: soft, nontender, + distended. No hepatosplenomegaly. No bruits or masses. Good bowel sounds. Extremities: no cyanosis, clubbing, rash. No ankle edema.   Neuro: alert & orientedx3, cranial nerves grossly intact. Moves all 4 extremities w/o difficulty. Affect pleasant.   ASSESSMENT & PLAN:  1. Chronic systolic CHF: NICM, EF 95%  s/p CRT-D ; Translator Present  - Reviewed patients discharge summary and  patient was just in the hospital for A/C HF and sepsis. His volume was difficult to manage and on discharge was still having issues. Patient not interested in palliative care or hospice at this time. He is a DNR.  - NYHA III symptoms and volume status stable. Will continue torsemide 80 mg BID and metolazone 5 mg daily. Renal function is up, however he reports yesterday weight was up 5 lbs and today he  feels great. It is going to be difficult to manage his fluid and keep his kidneys ok.  - Continue milrinone 0.375 mcg through PICC and will get weekly labs.   - Not on BB with history of low output and milrinone.  - He is not on ACE-I due to rash and no longer on ARB or spironolactone with renal function.l - K+ low from labs take extra 40 meq today.  - Discussed the importance of daily weights, a low sodium diet, and fluid restriction (less than 2 L a day). Instructed to call the HF clinic if weight increases more than 3 lbs overnight or 5 lbs in a week.  2. Paroxysmal atrial fibrillation/flutter: Cardioverted back to NSR 12/27/13. Appears to be in NSR on amiodraone. Will continue amio 200 mg BID. Continue coumadin. 3. Pulmonary arterial hypertension: Noted on RHC 02/21/13.  Continue revatio 20 mg TID 40 mg and milrinone.  4. CKD stage III: Baseline Creatinine 2.2-2.6. Continue weekly labs with [facility].   F/U 1 month  Rande Brunt  NP-C   04/10/2014

## 2014-04-10 NOTE — Patient Instructions (Addendum)
Haciendo bien.  Envie una receta para el estrenimeimento a Museum/gallery exhibitions officer. Yo solo quiero que used tome, segun sea necesario.   Seguimiento en 1 mesa

## 2014-04-11 ENCOUNTER — Telehealth (HOSPITAL_COMMUNITY): Payer: Self-pay | Admitting: *Deleted

## 2014-04-11 ENCOUNTER — Encounter (HOSPITAL_COMMUNITY): Payer: Self-pay | Admitting: *Deleted

## 2014-04-11 ENCOUNTER — Encounter (HOSPITAL_COMMUNITY): Payer: Self-pay | Admitting: Anesthesiology

## 2014-04-11 ENCOUNTER — Other Ambulatory Visit (HOSPITAL_COMMUNITY): Payer: Self-pay | Admitting: *Deleted

## 2014-04-11 ENCOUNTER — Ambulatory Visit (INDEPENDENT_AMBULATORY_CARE_PROVIDER_SITE_OTHER): Payer: Medicare Other | Admitting: Interventional Cardiology

## 2014-04-11 DIAGNOSIS — I480 Paroxysmal atrial fibrillation: Secondary | ICD-10-CM

## 2014-04-11 LAB — POCT INR: INR: 2.4

## 2014-04-11 NOTE — Telephone Encounter (Signed)
Called weight is 157 lbs and bp is 92/70 before taking medications today and is concerned about taking torsemide 80 mg BID and metolazone. Told to only give 80 mg today and tomorrow. If when she comes back on Sunday and weight remains stable and no signs of SOB told to only take torsemide again 80 mg and call on Monday and we may set up sliding scale diuretics. Discussed with Junie Bame NP-C

## 2014-04-15 ENCOUNTER — Telehealth (HOSPITAL_COMMUNITY): Payer: Self-pay | Admitting: Vascular Surgery

## 2014-04-15 ENCOUNTER — Ambulatory Visit (INDEPENDENT_AMBULATORY_CARE_PROVIDER_SITE_OTHER): Payer: Medicare Other | Admitting: Cardiovascular Disease

## 2014-04-15 DIAGNOSIS — I5022 Chronic systolic (congestive) heart failure: Secondary | ICD-10-CM

## 2014-04-15 DIAGNOSIS — I4891 Unspecified atrial fibrillation: Secondary | ICD-10-CM

## 2014-04-15 LAB — POCT INR: INR: 1.4

## 2014-04-15 MED ORDER — TORSEMIDE 20 MG PO TABS: 80.0000 mg | ORAL_TABLET | Freq: Two times a day (BID) | ORAL | Status: DC

## 2014-04-15 NOTE — Telephone Encounter (Signed)
new prescription of  Torsemide  with new dosage pt has no more torsemide

## 2014-04-16 ENCOUNTER — Encounter: Payer: Self-pay | Admitting: Internal Medicine

## 2014-04-16 ENCOUNTER — Telehealth (HOSPITAL_COMMUNITY): Payer: Self-pay | Admitting: *Deleted

## 2014-04-16 MED ORDER — POTASSIUM CHLORIDE CRYS ER 20 MEQ PO TBCR: 40.0000 meq | EXTENDED_RELEASE_TABLET | Freq: Two times a day (BID) | ORAL | Status: DC

## 2014-04-16 NOTE — Telephone Encounter (Signed)
Amiee, RN with [facility] called to report pt's labs, bun 62 cr 1.8 K 2.4 she states pt is taking KCL 20 meq daily, metolazone 5 mg daily and Torsemide 80 mg Twice daily (occ. Only takes 80 mg daily if wt or BP low), discussed w/[name] Ninfa Meeker, NP will have pt take k-dur 40 meq tid today then 40 meq bid starting tomorrow, Leota Sauers is aware and agreeable and will advise pt

## 2014-04-21 ENCOUNTER — Telehealth (HOSPITAL_COMMUNITY): Payer: Self-pay | Admitting: *Deleted

## 2014-04-21 ENCOUNTER — Ambulatory Visit (INDEPENDENT_AMBULATORY_CARE_PROVIDER_SITE_OTHER): Payer: Medicare Other | Admitting: Pharmacist

## 2014-04-21 DIAGNOSIS — I5022 Chronic systolic (congestive) heart failure: Secondary | ICD-10-CM

## 2014-04-21 DIAGNOSIS — I4891 Unspecified atrial fibrillation: Secondary | ICD-10-CM

## 2014-04-21 LAB — POCT INR: INR: 2.3

## 2014-04-21 MED ORDER — POTASSIUM CHLORIDE CRYS ER 20 MEQ PO TBCR: 40.0000 meq | EXTENDED_RELEASE_TABLET | Freq: Two times a day (BID) | ORAL | Status: DC

## 2014-04-21 MED ORDER — TORSEMIDE 20 MG PO TABS: 80.0000 mg | ORAL_TABLET | Freq: Every day | ORAL | Status: DC

## 2014-04-21 MED ORDER — METOLAZONE 5 MG PO TABS: 5.0000 mg | ORAL_TABLET | ORAL | Status: DC

## 2014-04-21 NOTE — Telephone Encounter (Signed)
Received pt's labs from [facility] that were drawn on 10/31, K 2.8 bun 58 cr 1.58, spoke w/Amiee she states pt's wt has dropped from a baseline of 162 last week to 150.6 on 10/29 after getting an enema and wt has continued to stay at 150, she states his BP has been running 90s-110s over 50-60s, pt denies dizziness, she reports he has no edema, abd distention or SOB and his lungs are CTA, she states he has only been taking Torsemide 80 mg daily and KCL 40 meq daily and Metolazone 5 mg daily, discussed w/[name] Ninfa Meeker, NP will have pt leave Torsemide at 80 mg daily, increase KCL to 40 meq Twice daily and decrease Metolazone to 5 mg twice a week on Sunday and Thursday, Amiee is aware and agreeable and will arrange pt's pill box accordingly she will repeat bmet on Wed

## 2014-04-24 ENCOUNTER — Telehealth (HOSPITAL_COMMUNITY): Payer: Self-pay | Admitting: Vascular Surgery

## 2014-04-25 ENCOUNTER — Telehealth (HOSPITAL_COMMUNITY): Payer: Self-pay | Admitting: Vascular Surgery

## 2014-04-25 DIAGNOSIS — I5022 Chronic systolic (congestive) heart failure: Secondary | ICD-10-CM

## 2014-04-25 MED ORDER — POTASSIUM CHLORIDE CRYS ER 20 MEQ PO TBCR: EXTENDED_RELEASE_TABLET | ORAL | Status: DC

## 2014-04-25 MED ORDER — MAGNESIUM OXIDE 400 MG PO TABS: 400.0000 mg | ORAL_TABLET | Freq: Two times a day (BID) | ORAL | Status: AC

## 2014-04-25 MED ORDER — TORSEMIDE 20 MG PO TABS: 80.0000 mg | ORAL_TABLET | Freq: Every day | ORAL | Status: DC

## 2014-04-25 NOTE — Telephone Encounter (Signed)
k 2.9 bun 47 cr 1.55 mag 1.5, per Junie Bame, NP increase KCL to 60 meq in AM and 40 meq PM and increase mag-ox 400 mg Twice daily, Amiee aware and will adjust pt's meds

## 2014-04-25 NOTE — Telephone Encounter (Signed)
Nurase called pt potassium is still low.. Please advise nurse on what you want her to do with pt meds.Marland Kitchen

## 2014-04-28 NOTE — Telephone Encounter (Signed)
OPEN IN ERROR 

## 2014-04-29 ENCOUNTER — Ambulatory Visit (INDEPENDENT_AMBULATORY_CARE_PROVIDER_SITE_OTHER): Payer: Medicare Other | Admitting: Pharmacist

## 2014-04-29 DIAGNOSIS — I4891 Unspecified atrial fibrillation: Secondary | ICD-10-CM

## 2014-04-29 LAB — POCT INR: INR: 2.1

## 2014-04-30 ENCOUNTER — Telehealth (HOSPITAL_COMMUNITY): Payer: Self-pay | Admitting: Cardiology

## 2014-04-30 ENCOUNTER — Encounter: Payer: Self-pay | Admitting: Internal Medicine

## 2014-04-30 NOTE — Telephone Encounter (Signed)
Received lab results from 04/29/14 k 2.5 Pt currently taking potassium 40 bid Per vo Junie Bame, NP pt should take additional 40 meq on top of 40/40 regimen Should increase potassium to 60 mEg in the Am and 40 mEg in the PM starting 05/01/14  [name] Anngraves,RN with [facility] aware [phone]

## 2014-05-02 ENCOUNTER — Telehealth: Payer: Self-pay | Admitting: Cardiology

## 2014-05-02 NOTE — Telephone Encounter (Signed)
   Was contacted by home health nurse, [name], regarding critical lab values with potassium of 2.5. Patient was also hypokalemic on 04/29/14. At that time, he was instructed to increase his potassium supplementation to 60 mEq in the a.m. and 40 mEq in the p.m.His home health nurse is skeptical as to whether or not he's actually been taking the medications she has been leaving for him and his pillbox. She reports that his volume status is stable. Baseline weight is 162 pounds. His weight today was 158 pounds. I have recommended that he hold his torsemide tomorrow morning. She was instructed to have him take an additional 60 mEq tonight of potassium and 40 mEq of potassium in the a.m. Recheck potassium as well as a magnesium tomorrow at noon. Lab values will be reported to the on call provider tomorrow afternoon. The home health nurse verbalized understanding.  Addy Mcmannis 05/02/2014

## 2014-05-03 ENCOUNTER — Telehealth: Payer: Self-pay | Admitting: Physician Assistant

## 2014-05-03 NOTE — Telephone Encounter (Signed)
HHRN called. Patient has not been getting K+ as scheduled (pills missing from pill box). See phone note from 11/13. He got his Torsemide anyway this AM. Will give K+ 60 mEq bid today. Hold Torsemide tomorrow AM. Get BMET and Mg 2+ in AM. Signed,  Richardson Dopp, PA-C   05/03/2014 9:53 AM

## 2014-05-06 ENCOUNTER — Ambulatory Visit (INDEPENDENT_AMBULATORY_CARE_PROVIDER_SITE_OTHER): Payer: Medicare Other | Admitting: Cardiovascular Disease

## 2014-05-06 DIAGNOSIS — I4891 Unspecified atrial fibrillation: Secondary | ICD-10-CM

## 2014-05-06 LAB — POCT INR: INR: 1.8

## 2014-05-07 NOTE — Telephone Encounter (Signed)
This encounter was opened in error.   SIMMONS, BRITTAINY 05/07/2014

## 2014-05-08 ENCOUNTER — Telehealth (HOSPITAL_COMMUNITY): Payer: Self-pay

## 2014-05-08 NOTE — Telephone Encounter (Signed)
error 

## 2014-05-09 ENCOUNTER — Telehealth (HOSPITAL_COMMUNITY): Payer: Self-pay | Admitting: Vascular Surgery

## 2014-05-09 NOTE — Telephone Encounter (Signed)
Left message to call back  

## 2014-05-09 NOTE — Telephone Encounter (Signed)
Nurse from [facility].. One night this week pt sat up on the side of the bed unable to breath.. Nurse wants to know if he has a night like that again can they get a order to give him a extra fluid pill... FYI.. PT does not have an appetite he is forcing himself to eat.. Please advise

## 2014-05-13 ENCOUNTER — Ambulatory Visit (INDEPENDENT_AMBULATORY_CARE_PROVIDER_SITE_OTHER): Payer: Medicare Other | Admitting: Interventional Cardiology

## 2014-05-13 DIAGNOSIS — I4891 Unspecified atrial fibrillation: Secondary | ICD-10-CM

## 2014-05-13 LAB — POCT INR: INR: 1.5

## 2014-05-13 NOTE — Telephone Encounter (Signed)
Will address at [place] 11/25

## 2014-05-14 ENCOUNTER — Encounter (HOSPITAL_COMMUNITY): Payer: Self-pay

## 2014-05-14 ENCOUNTER — Ambulatory Visit (HOSPITAL_COMMUNITY)
Admission: RE | Admit: 2014-05-14 | Discharge: 2014-05-14 | Disposition: A | Discharge status: Home / Self Care | Payer: Medicare Other | Source: Ambulatory Visit | Attending: Cardiology | Admitting: Cardiology

## 2014-05-14 ENCOUNTER — Telehealth (HOSPITAL_COMMUNITY): Payer: Self-pay

## 2014-05-14 ENCOUNTER — Other Ambulatory Visit: Payer: Self-pay | Admitting: Internal Medicine

## 2014-05-14 VITALS — BP 100/54 | HR 66 | Resp 18 | Wt 151.0 lb

## 2014-05-14 DIAGNOSIS — I2721 Secondary pulmonary arterial hypertension: Secondary | ICD-10-CM

## 2014-05-14 DIAGNOSIS — I272 Other secondary pulmonary hypertension: Secondary | ICD-10-CM | POA: Diagnosis not present

## 2014-05-14 DIAGNOSIS — Z7901 Long term (current) use of anticoagulants: Secondary | ICD-10-CM | POA: Diagnosis not present

## 2014-05-14 DIAGNOSIS — I5022 Chronic systolic (congestive) heart failure: Secondary | ICD-10-CM | POA: Diagnosis present

## 2014-05-14 DIAGNOSIS — I480 Paroxysmal atrial fibrillation: Secondary | ICD-10-CM | POA: Insufficient documentation

## 2014-05-14 DIAGNOSIS — N183 Chronic kidney disease, stage 3 (moderate): Secondary | ICD-10-CM | POA: Insufficient documentation

## 2014-05-14 MED ORDER — SPIRONOLACTONE 25 MG PO TABS: 12.5000 mg | ORAL_TABLET | Freq: Every day | ORAL | Status: DC

## 2014-05-14 MED ORDER — POTASSIUM CHLORIDE CRYS ER 20 MEQ PO TBCR: 60.0000 meq | EXTENDED_RELEASE_TABLET | Freq: Two times a day (BID) | ORAL | Status: DC

## 2014-05-14 NOTE — Telephone Encounter (Signed)
[name] updated on today's visit and all changes made with medications as well as follow up appointment.    Tyler Christensen

## 2014-05-14 NOTE — Progress Notes (Addendum)
Patient ID: Tyler Christensen, male   DOB: 07-May-1933, 80 y.o.   MRN: [id number]  Interpreter: Lily Lovings  [employer]  HPI: Tyler Christensen is a 80 y.o. [name] (non-english speaking) w/ PMHx significant for chronic systolic CHF (EF 66%), NICM, LBBB, PAF (on coumadin), HTN, HLD, CVA, Pulmonary Hypertension and h/o seizure d/o (2/2 CVA 05/2012).   Admitted to [facility] 06/2012 with acute decompensated heart failure EF 20%. As noted below, also diagnosed with severe pulmonary hypertension (PAPs ~ 90) from initial RHC and placed on Milrinone and Revatio (eventually transitioned to tadalafil 40) with marked clinical response and decrease in PA pressures. VQ low probability for pulmonary embolus.  Discharged on Milrinone at 0.375 mcg via PICC. Discharge Weight 146 pounds. Attempted to wean milrinone in 9/14 but failed due to worsening HF and PAH. Milrinone restarted.  Admitted to the hospital 5/21-11/22/13. Found to have staph aureus bacteremia treated with IV abx. TEE negative for endocarditis. Also found to retroperitoneal mass and biopsy revealed benign lesion. He had evidence of low output thought to be due to atrial fibrillation and milrinone increased. Attempted TEE and DC-CV but had LAA clot so deferred cardioversion.  Follow up for Heart Failure: Since last visit he has had many medication issues especially with potassium and [name] the [facility] has been helping fill his medication boxes. Denies CP, orthopnea or PND. Reports minimal LE edema. Difficult to quantify symptoms reports he is "only SOB when he does not pee a lot, but then says he is peeing a lot." Pretty much stays in his house. Weight at home 150 lbs. Has no appetite. Reports has issues with stomach big and feels like taking too many pills. No constipation. Eating low sodium and drinking less than 2L a day.    NO ACE-I 2/2 rash   08/14/12 S/P successful DC-CV of AF 10/26/12 S/P BiV-ICD implant 01/07/13 ECHO EF 20%, restrictive diastolic function, mildly decreased RV  size and systolic fxn, mild to moderate MR, PA systolic pressure 59 mmHg  02/21/13 ECHO EF 20% PA pressure mean 59 11/12/13: ECHO EF 20-25%, trivial AI,     RHC 02/21/13 (off milrinone) RA = 9 with v-waves to 20  RV = 72/8/20  PA = 69/27 (44)  PCW = 18-20  Fick cardiac output/index = 2.7/1.5  PVR = 9.6 woods  FA sat = 96%  PA sat = 48%, 52%  Labs      (06/25/13) K 4.2 Creatinine 1.58 Magnesium 2.0     (07/16/13) K 4.2, Cr 1.67    (2/15) creatinine 1.97 => 1.94, K 4.6, HCT 39.4    (4/15) K 4.5, creatinine 1.56, pro-BNP 6912    (4/15) K 3.9, creatinine 2.39, mag 2.1     (09/2013) K 3.7, creatinine 2.03     11/26/13 K 4.6 cr 2.8 hgb 13.2     12/11/13 K 3.4 Creatinine 2.14  Magnesium 2.1     12/19/13: K+ 3.4, creatinine 2.42, BUN 57     01/14/14: K 3.8, creatinine 2.45, BUN 45, mag 2.1    SH: Lives with his wife here in [place].   ROS: All systems negative except as listed in HPI, PMH and Problem List.  Past Medical History  Diagnosis Date  . HTN (hypertension)   . High cholesterol   . CHF (congestive heart failure)   . BBBB (bilateral bundle branch block)   . LBBB (left bundle branch block)   . Seizures 06/11/2012    new onset/notes (06/11/2012)  . SOB (shortness of  breath)     "sometimes when I lay down;; related to not taking my RX" (06/11/2012)  . Myocardial infarction     06/10/2012  . Atrial fibrillation   . Stroke   . Atrial thrombus     left  . Pulmonary HTN     Current Outpatient Prescriptions  Medication Sig Dispense Refill  . amiodarone (PACERONE) 200 MG tablet Take 1 tablet (200 mg total) by mouth 2 (two) times daily. 60 tablet 6  . atorvastatin (LIPITOR) 20 MG tablet Take 1 tablet (20 mg total) by mouth daily at 6 PM. 30 tablet 6  . loratadine (CLARITIN) 10 MG tablet Take 10 mg by mouth daily.    . magnesium oxide (MAG-OX) 400 MG tablet Take 1 tablet (400 mg total) by mouth 2 (two) times daily. 60 tablet 3  . metolazone (ZAROXOLYN) 5 MG tablet Take 1 tablet (5 mg  total) by mouth 2 (two) times a week. Every Sunday and Thursday 60 tablet 0  . milrinone (PRIMACOR) 20 MG/100ML SOLN infusion Inject 26.775 mcg/min into the vein continuous. 100 mL 6  . oxyCODONE 10 MG TABS Take 1 tablet (10 mg total) by mouth every 4 (four) hours as needed for severe pain. 30 tablet 0  . potassium chloride SA (K-DUR,KLOR-CON) 20 MEQ tablet Take 3 tabs in AM and 2 tabs in PM 150 tablet 3  . senna-docusate (SENOKOT-S) 8.6-50 MG per tablet Take 1 tablet by mouth at bedtime. 30 tablet 3  . sildenafil (REVATIO) 20 MG tablet Take 1 tablet (20 mg total) by mouth 3 (three) times daily. 90 tablet 3  . sorbitol 70 % SOLN Take 30 mLs by mouth daily as needed for moderate constipation. 500 mL 3  . torsemide (DEMADEX) 20 MG tablet Take 4 tablets (80 mg total) by mouth daily. 120 tablet 3  . traMADol (ULTRAM) 50 MG tablet Take 50 mg by mouth every 12 (twelve) hours as needed for moderate pain.    Marland Kitchen warfarin (COUMADIN) 4 MG tablet Take 2-4 mg by mouth daily. 4mg  daily except Friday and Sunday takes 2mg     . warfarin (COUMADIN) 4 MG tablet TAKE AS DIRECTED BY COUMADIN CLINIC 35 tablet 0   No current facility-administered medications for this encounter.    Filed Vitals:   05/14/14 1053  BP: 100/54  Pulse: 66  Resp: 18  Weight: 151 lb (68.493 kg)  SpO2: 100%   PHYSICAL EXAM: General:  Elderly, chronically ill appearing. NAD.No resp difficulty; interpreter present HEENT: normal Neck: supple. JVD 8; Carotids 2+ bilaterally; no bruits. No lymphadenopathy or thryomegaly appreciated. RIJ 2L Picc in chest Cor: PMI laterally displaced. Regular rhythm and rate. No s3 or murmur.  Lungs: clear Abdomen: soft, nontender, mildy distended. No hepatosplenomegaly. No bruits or masses. Good bowel sounds. Extremities: no cyanosis, clubbing, rash. 1-2+ bilateral edema.   Neuro: alert & orientedx3, cranial nerves grossly intact. Moves all 4 extremities w/o difficulty. Affect pleasant.   ASSESSMENT &  PLAN:  1. Chronic systolic CHF: NICM, EF 41%  s/p CRT-D ; Translator Present  -  Patient not interested in palliative care or hospice at this time. He is a DNR.  - NYHA III symptoms and volume status stable. Will continue torsemide 80 mg BID and metolazone 5 mg every Sunday and Thursday. [name] from [facility] is his nurse and fills his pill box. - Continue milrinone 0.375 mcg through PICC and will get weekly labs.   - Not on BB with history of low output and  milrinone.  - He is not on ACE-I due to rash and no longer on ARB or spironolactone with renal function. - K+ low from labs yesterday gave 40 meq today in clinic and then will increase potassium to 60 meq BID.  - Start spironolactone 12.5 mg daily to help with potassium.   - Discussed the importance of daily weights, a low sodium diet, and fluid restriction (less than 2 L a day). Instructed to call the HF clinic if weight increases more than 3 lbs overnight or 5 lbs in a week.  2. Paroxysmal atrial fibrillation/flutter: Cardioverted back to NSR 12/27/13. Appears to be in NSR on amiodraone. Will continue amio 200 mg BID. Continue coumadin. 3. Pulmonary arterial hypertension: Noted on RHC 02/21/13.  Continue revatio 20 mg TID 40 mg and milrinone.  4. CKD stage III: Baseline Creatinine 2.2-2.6. Creatinine improving and was 1.3 today. Will continue to follow.   Patient is having early satiety and is end stage HF. Will stop any non-essential medications (atorvastain and loratadine). Discussed with patient the need to allow [name] HHRN to fill pill boxes and to not take anything extra. Will call [name] with all of the changes today.    F/U 1 month  Rande Brunt  NP-C   05/14/2014  Patient seen and examined with Junie Bame, NP. We discussed all aspects of the encounter. I agree with the assessment and plan as stated above. Continues with end-stage HF and PAH but overall relatively stable. Volume status about as good as we can get it. Will continue current  regimen. Reinforced need for daily weights and reviewed use of sliding scale diuretics. Will continue to follow closely via [facility] and HF Clinic.   Jaliya Siegmann,MD 12:44 AM

## 2014-05-14 NOTE — Patient Instructions (Signed)
PARAR Lipitor.  PARADA Loratidine.  Aumentar el potasio a tres Merchandiser, retail.  Empezar espironolactona medio tableta ( 12,5 mg) Dollene Cleveland al da.  Siga en nuestra oficina en un mes.  Feliz Da de [place] Pert!

## 2014-05-20 ENCOUNTER — Telehealth (HOSPITAL_COMMUNITY): Payer: Self-pay | Admitting: Vascular Surgery

## 2014-05-20 ENCOUNTER — Ambulatory Visit (INDEPENDENT_AMBULATORY_CARE_PROVIDER_SITE_OTHER): Payer: Medicare Other | Admitting: Cardiovascular Disease

## 2014-05-20 DIAGNOSIS — I4891 Unspecified atrial fibrillation: Secondary | ICD-10-CM

## 2014-05-20 LAB — POCT INR: INR: 1.5

## 2014-05-20 NOTE — Telephone Encounter (Signed)
[name] called.. PT Potassium 2.4 pt missed 4 evening doses and 4 evening Coumidin doses ..the patient has also been having belly pain.. He believe its medicine... Please advise

## 2014-05-21 NOTE — Telephone Encounter (Signed)
Returned call to [name] who confirms patient does not want to take pills anymore.  States he is more frequently throwing pills away in trash can to make it look like he is taking them.  States his stomach hurts all the time taking medications without any appetite on empty stomach.  Do not think change in medication form will matter since he is not eating with these meds.  Also refusing to take coumadin.  Asked [name] to return tomorrow to try to talk with him about any suggestions he may have to help his appetite or if he would be willing to take something to help coat stomach if MD approved.  Asked her to also get BMET rechecked.  Renee Pain

## 2014-05-25 ENCOUNTER — Other Ambulatory Visit (HOSPITAL_COMMUNITY): Payer: Self-pay | Admitting: Internal Medicine

## 2014-05-25 DIAGNOSIS — I5022 Chronic systolic (congestive) heart failure: Secondary | ICD-10-CM

## 2014-05-27 ENCOUNTER — Other Ambulatory Visit (HOSPITAL_COMMUNITY): Payer: Self-pay | Admitting: Internal Medicine

## 2014-05-27 ENCOUNTER — Telehealth (HOSPITAL_COMMUNITY): Payer: Self-pay | Admitting: *Deleted

## 2014-05-27 ENCOUNTER — Ambulatory Visit (INDEPENDENT_AMBULATORY_CARE_PROVIDER_SITE_OTHER): Payer: Medicare Other | Admitting: Cardiovascular Disease

## 2014-05-27 DIAGNOSIS — I4891 Unspecified atrial fibrillation: Secondary | ICD-10-CM

## 2014-05-27 LAB — POCT INR: INR: 2.5

## 2014-05-27 MED ORDER — SPIRONOLACTONE 25 MG PO TABS: 25.0000 mg | ORAL_TABLET | Freq: Every day | ORAL | Status: DC

## 2014-05-27 NOTE — Telephone Encounter (Signed)
Amiee, RN called to report pt's K is 2.7 today, she states she has spoken with pt and he states he has been taking KCL, per Junie Bame, NP increase Arlyce Harman to 25 mg daily and take an extra 60 meq KCL today, Amiee is aware and will inform pt

## 2014-05-28 ENCOUNTER — Encounter: Payer: Self-pay | Admitting: Internal Medicine

## 2014-05-29 ENCOUNTER — Encounter: Payer: Self-pay | Admitting: Internal Medicine

## 2014-05-29 ENCOUNTER — Encounter (HOSPITAL_COMMUNITY): Payer: Self-pay | Admitting: Internal Medicine

## 2014-05-31 ENCOUNTER — Encounter (HOSPITAL_COMMUNITY): Payer: Self-pay | Admitting: Emergency Medicine

## 2014-05-31 ENCOUNTER — Emergency Department (HOSPITAL_COMMUNITY): Payer: Medicare Other

## 2014-05-31 ENCOUNTER — Emergency Department (HOSPITAL_COMMUNITY)
Admission: EM | Admit: 2014-05-31 | Discharge: 2014-05-31 | Disposition: A | Discharge status: Home / Self Care | Payer: Medicare Other | Attending: Emergency Medicine | Admitting: Emergency Medicine

## 2014-05-31 DIAGNOSIS — Z79899 Other long term (current) drug therapy: Secondary | ICD-10-CM | POA: Insufficient documentation

## 2014-05-31 DIAGNOSIS — R109 Unspecified abdominal pain: Secondary | ICD-10-CM

## 2014-05-31 DIAGNOSIS — Z86718 Personal history of other venous thrombosis and embolism: Secondary | ICD-10-CM | POA: Insufficient documentation

## 2014-05-31 DIAGNOSIS — N289 Disorder of kidney and ureter, unspecified: Secondary | ICD-10-CM | POA: Diagnosis not present

## 2014-05-31 DIAGNOSIS — K5900 Constipation, unspecified: Secondary | ICD-10-CM | POA: Diagnosis not present

## 2014-05-31 DIAGNOSIS — I4891 Unspecified atrial fibrillation: Secondary | ICD-10-CM | POA: Insufficient documentation

## 2014-05-31 DIAGNOSIS — I10 Essential (primary) hypertension: Secondary | ICD-10-CM | POA: Insufficient documentation

## 2014-05-31 DIAGNOSIS — I509 Heart failure, unspecified: Secondary | ICD-10-CM | POA: Diagnosis not present

## 2014-05-31 DIAGNOSIS — R110 Nausea: Secondary | ICD-10-CM | POA: Insufficient documentation

## 2014-05-31 DIAGNOSIS — Z8673 Personal history of transient ischemic attack (TIA), and cerebral infarction without residual deficits: Secondary | ICD-10-CM | POA: Diagnosis not present

## 2014-05-31 DIAGNOSIS — I252 Old myocardial infarction: Secondary | ICD-10-CM | POA: Insufficient documentation

## 2014-05-31 DIAGNOSIS — E876 Hypokalemia: Secondary | ICD-10-CM | POA: Insufficient documentation

## 2014-05-31 DIAGNOSIS — Z7901 Long term (current) use of anticoagulants: Secondary | ICD-10-CM | POA: Insufficient documentation

## 2014-05-31 LAB — CBC WITH DIFFERENTIAL/PLATELET
Basophils Absolute: 0 10*3/uL (ref 0.0–0.1)
Basophils Relative: 0 % (ref 0–1)
EOS PCT: 2 % (ref 0–5)
Eosinophils Absolute: 0.2 10*3/uL (ref 0.0–0.7)
HEMATOCRIT: 34.7 % — AB (ref 39.0–52.0)
HEMOGLOBIN: 11.7 g/dL — AB (ref 13.0–17.0)
LYMPHS PCT: 10 % — AB (ref 12–46)
Lymphs Abs: 1 10*3/uL (ref 0.7–4.0)
MCH: 28.7 pg (ref 26.0–34.0)
MCHC: 33.7 g/dL (ref 30.0–36.0)
MCV: 85.3 fL (ref 78.0–100.0)
MONO ABS: 1 10*3/uL (ref 0.1–1.0)
MONOS PCT: 9 % (ref 3–12)
Neutro Abs: 8.4 10*3/uL — ABNORMAL HIGH (ref 1.7–7.7)
Neutrophils Relative %: 79 % — ABNORMAL HIGH (ref 43–77)
Platelets: 327 10*3/uL (ref 150–400)
RBC: 4.07 MIL/uL — ABNORMAL LOW (ref 4.22–5.81)
RDW: 14.3 % (ref 11.5–15.5)
WBC: 10.6 10*3/uL — ABNORMAL HIGH (ref 4.0–10.5)

## 2014-05-31 LAB — URINALYSIS, ROUTINE W REFLEX MICROSCOPIC
Bilirubin Urine: NEGATIVE
Glucose, UA: NEGATIVE mg/dL
Hgb urine dipstick: NEGATIVE
Ketones, ur: NEGATIVE mg/dL
Nitrite: NEGATIVE
PH: 7.5 (ref 5.0–8.0)
Protein, ur: NEGATIVE mg/dL
Specific Gravity, Urine: 1.01 (ref 1.005–1.030)
Urobilinogen, UA: 0.2 mg/dL (ref 0.0–1.0)

## 2014-05-31 LAB — URINE MICROSCOPIC-ADD ON

## 2014-05-31 LAB — COMPREHENSIVE METABOLIC PANEL
ALT: 78 U/L — ABNORMAL HIGH (ref 0–53)
ANION GAP: 18 — AB (ref 5–15)
AST: 46 U/L — AB (ref 0–37)
Albumin: 3.6 g/dL (ref 3.5–5.2)
Alkaline Phosphatase: 160 U/L — ABNORMAL HIGH (ref 39–117)
BILIRUBIN TOTAL: 0.4 mg/dL (ref 0.3–1.2)
BUN: 66 mg/dL — AB (ref 6–23)
CHLORIDE: 85 meq/L — AB (ref 96–112)
CO2: 29 meq/L (ref 19–32)
CREATININE: 2.57 mg/dL — AB (ref 0.50–1.35)
Calcium: 9.3 mg/dL (ref 8.4–10.5)
GFR calc Af Amer: 26 mL/min — ABNORMAL LOW (ref >90)
GFR calc non Af Amer: 22 mL/min — ABNORMAL LOW (ref >90)
GLUCOSE: 131 mg/dL — AB (ref 70–99)
Potassium: 2.7 mEq/L — CL (ref 3.7–5.3)
Sodium: 132 mEq/L — ABNORMAL LOW (ref 137–147)
Total Protein: 7.7 g/dL (ref 6.0–8.3)

## 2014-05-31 LAB — LIPASE, BLOOD: Lipase: 61 U/L — ABNORMAL HIGH (ref 11–59)

## 2014-05-31 LAB — PROTIME-INR
INR: 1.94 — ABNORMAL HIGH (ref 0.00–1.49)
Prothrombin Time: 22.3 seconds — ABNORMAL HIGH (ref 11.6–15.2)

## 2014-05-31 MED ORDER — SENNOSIDES-DOCUSATE SODIUM 8.6-50 MG PO TABS: 2.0000 | ORAL_TABLET | Freq: Every day | ORAL | Status: DC

## 2014-05-31 MED ORDER — IOHEXOL 300 MG/ML  SOLN
25.0000 mL | INTRAMUSCULAR | Status: AC
  Administered 2014-05-31: 25 mL via ORAL

## 2014-05-31 MED ORDER — POTASSIUM CHLORIDE 10 MEQ/100ML IV SOLN
10.0000 meq | INTRAVENOUS | Status: AC
  Administered 2014-05-31 (×2): 10 meq via INTRAVENOUS
  Filled 2014-05-31 (×2): qty 100

## 2014-05-31 MED ORDER — DISPOSABLE ENEMA 19-7 GM/118ML RE ENEM: 1.0000 | ENEMA | Freq: Once | RECTAL | Status: DC

## 2014-05-31 MED ORDER — FLEET ENEMA 7-19 GM/118ML RE ENEM
1.0000 | ENEMA | Freq: Once | RECTAL | Status: AC
  Administered 2014-05-31: 1 via RECTAL
  Filled 2014-05-31: qty 1

## 2014-05-31 MED ORDER — SODIUM CHLORIDE 0.9 % IV BOLUS (SEPSIS)
500.0000 mL | Freq: Once | INTRAVENOUS | Status: AC
  Administered 2014-05-31: 500 mL via INTRAVENOUS

## 2014-05-31 NOTE — Discharge Instructions (Signed)
Estreimiento (Constipation) Estreimiento significa que una persona tiene menos de tres evacuaciones en una semana, dificultad para defecar, o que las heces son secas, duras, o ms grandes que lo normal. A medida que envejecemos el estreimiento es ms comn. Si intenta curar el estreimiento con medicamentos que producen la evacuacin de las heces (laxantes), el problema puede empeorar. El uso prolongado de laxantes puede hacer que los msculos del colon se debiliten. Una dieta baja en fibra, no tomar suficientes lquidos y el uso de ciertos medicamentos pueden empeorar el estreimiento.  CAUSAS   Ciertos medicamentos, como los antidepresivos, analgsicos, suplementos de hierro, anticidos y diurticos.  Algunas enfermedades, como la diabetes, el sndrome del colon irritable, enfermedad de la tiroides, o depresin.  No beber suficiente agua.  No consumir suficientes alimentos ricos en fibra.  Situaciones de estrs o viajes.  Falta de actividad fsica o de ejercicio.  Ignorar la necesidad sbita de defecar.  Uso en exceso de laxantes. SIGNOS Y SNTOMAS   Defecar menos de tres veces por semana.  Dificultad para defecar.  Tener las heces secas y duras, o ms grandes que las normales.  Sensacin de estar lleno o hinchado.  Dolor en la parte baja del abdomen.  No sentir alivio despus de defecar. DIAGNSTICO  El mdico le har una historia clnica y un examen fsico. Pueden hacerle exmenes adicionales para el estreimiento grave. Estos estudios pueden ser:  Un radiografa con enema de bario para examinar el recto, el colon y, en algunos casos, el intestino delgado.  Una sigmoidoscopia para examinar el colon inferior.  Una colonoscopia para examinar todo el colon. TRATAMIENTO  El tratamiento depender de la gravedad del estreimiento y de la causa. Algunos tratamientos nutricionales son beber ms lquidos y comer ms alimentos ricos en fibra. El cambio en el estilo de vida  incluye hacer ejercicios de manera regular. Si estas recomendaciones para realizar cambios en la dieta y en el estilo de vida no ayudan, el mdico le puede indicar el uso de laxantes de venta libre para ayudarlo a defecar. Los medicamentos recetados se pueden prescribir si los medicamentos de venta libre no lo ayudan.  INSTRUCCIONES PARA EL CUIDADO EN EL HOGAR   Consuma alimentos con alto contenido de fibra, como frutas, vegetales, cereales integrales y porotos.  Limite los alimentos procesados ricos en grasas y azcar, como las papas fritas, hamburguesas, galletas, dulces y refrescos.  Puede agregar un suplemento de fibra a su dieta si no obtiene lo suficiente de los alimentos.  Beba suficiente lquido para mantener la orina clara o de color amarillo plido.  Haga ejercicio regularmente o segn las indicaciones del mdico.  Vaya al bao cuando sienta la necesidad de ir. No se aguante las ganas.  Tome solo medicamentos de venta libre o recetados, segn las indicaciones del mdico. No tome otros medicamentos para el estreimiento sin consultarlo antes con su mdico. SOLICITE ATENCIN MDICA DE INMEDIATO SI:   Observa sangre brillante en las heces.  El estreimiento dura ms de 4 das o empeora.  Siente dolor abdominal o rectal.  Las heces son delgadas como un lpiz.  Pierde peso de manera inexplicable. ASEGRESE DE QUE:   Comprende estas instrucciones.  Controlar su afeccin.  Recibir ayuda de inmediato si no mejora o si empeora. Document Released: 06/26/2007 Document Revised: 06/11/2013 ExitCare Patient Information 2015 [employer]. This information is not intended to replace advice given to you by your health care provider. Make sure you discuss any questions you have with your health   care provider.  Hipokalemia ( Hypokalemia) Hipokalemia significa que el nivel de potasio en sangre es menor que lo normal. El potasio es un electrolito que ayuda a regular la cantidad de  lquido del organismo. Tambin estimula la contraccin muscular y ayuda a que la funcin muscular sea la [place]. La Delorise Shiner del potasio del organismo se encuentra dentro de las clulas y slo una pequea cantidad en la sangre. Debido a que la cantidad en la sangre es muy pequea, pequeos cambios en la sangre pueden poner en peligro la vida. CAUSAS  Antibiticos.  Diarrea o vmitos.  El uso excesivo de laxantes, lo que puede causar diarrea.  Enfermedad renal crnica.  Uso de diurticos.  Trastornos de Youth worker (bulimia).  Bajos niveles de magnesio.  Sudoracin abundante. [place].  Estreimiento.  Fatiga.  Calambres musculares.  Confusin mental.  Latidos cardacos salteados o irregulares (palpitaciones).  Hormigueo o adormecimiento. DIAGNSTICO  El mdico puede diagnosticar hipokalemia por los anlisis de [place]. Adems para controlar sus niveles de potasio, el mdico podr ordenar otros anlisis de laboratorio. TRATAMIENTO La hipokalemia puede tratarse con suplementos de potasio por va oral o realizando ajustes en sus medicamentos habituales. Si sus niveles de potasio son muy bajos, ser necesario que lo reciba a travs de una vena (IV) y se lo controle en el hospital. Ardelia Mems dieta rica en potasio tambin puede ser de [place]. Los alimentos ricos en potasio son:  Clayburn Pert secos, como cacahuetes y pistachos.  Semillas, como semillas de girasol y de Scientific laboratory technician.  Porotos, guisantes secos y lentejas.  Granos enteros y panes y cereales con salvado.  Lambert Mody y vegetales frescos como damascos, avocado, bananas, meln, kiwi, naranjas, esprragos y patatas.  Jugos de naranja y tomates.  Carnes rojas.  Yogur con frutas. INSTRUCCIONES PARA EL CUIDADO EN EL HOGAR  Tome todos los [employer] indic el mdico.  Siga una dieta saludable e incluya alimentos nutritivos como frutas, vegetales, nueces, granos enteros y carnes [place].  Si est  tomando laxantes, asegrese de seguir las instrucciones del envase. SOLICITE ATENCIN MDICA SI:  La debilidad empeora.  Siente que el corazn late fuerte o est acelerado.  Vomita o tiene diarrea.  Tiene problemas para mantener su nivel de glucosa en el rango normal. SOLICITE ATENCIN MDICA DE INMEDIATO SI:  Siente dolor en el pecho, le falta de aire o se siente mareado.  Vomita o tiene diarrea durante ms de 2 das.  Se desmaya. ASEGRESE DE QUE:   Comprende estas instrucciones.  Controlar su afeccin.  Recibir ayuda de inmediato si no mejora o si empeora. Document Released: 06/06/2005 Document Revised: 03/27/2013 [facility] Patient Information 2015 [place], [place]. This information is not intended to replace advice given to you by your health care provider. Make sure you discuss any questions you have with your health care provider.

## 2014-05-31 NOTE — ED Notes (Signed)
Pt had a small BM

## 2014-05-31 NOTE — ED Notes (Signed)
Pt is aware he needs a urine sample 

## 2014-05-31 NOTE — ED Provider Notes (Signed)
As seemed care of the patient on sign out. On my initial exam the patient continued to complain of abdominal pain. I reviewed the CT findings with the patient, answered, he received enema. Patient had a bowel movement, stated that he felt improved. No new complaints. Patient was discharged with new bowel regimen, to follow-up with his primary care physician.   Carmin Muskrat, MD 05/31/14 [phone]

## 2014-05-31 NOTE — ED Provider Notes (Signed)
CSN: [id number]     Arrival date & time 05/31/14  6160 History   First MD Initiated Contact with Patient 05/31/14 0431     Chief Complaint  Patient presents with  . Constipation     (Consider location/radiation/quality/duration/timing/severity/associated sxs/prior Treatment) HPI Patient presents with 7 days of diffuse abdominal pain. States she has not had a bowel movement for 7 days. He's had nausea but denies any vomiting. He's had no fever or chills. Patient states he is taking no over-the-counter medication constipation. Denies any urinary symptoms. Past Medical History  Diagnosis Date  . HTN (hypertension)   . High cholesterol   . CHF (congestive heart failure)   . BBBB (bilateral bundle branch block)   . LBBB (left bundle branch block)   . Seizures 06/11/2012    new onset/notes (06/11/2012)  . SOB (shortness of breath)     "sometimes when I lay down;; related to not taking my RX" (06/11/2012)  . Myocardial infarction     06/10/2012  . Atrial fibrillation   . Stroke   . Atrial thrombus     left  . Pulmonary HTN    Past Surgical History  Procedure Laterality Date  . Throat surgery  1942    "and nose" (06/11/2012); ?T&A  . Inguinal hernia repair  ~ 2008    "both sides" (06/11/2012)  . Tee without cardioversion  06/29/2012    Procedure: TRANSESOPHAGEAL ECHOCARDIOGRAM (TEE);  Surgeon: Jolaine Artist, MD;  Location: [facility] ENDOSCOPY;  Service: Cardiovascular;  Laterality: N/A;  will need a spanish interpreter Interpreter will be here at 1330-Hope spoke w/ [place]  . Cardioversion N/A 08/14/2012    Procedure: CARDIOVERSION;  Surgeon: Jolaine Artist, MD;  Location: [facility] ENDOSCOPY;  Service: Cardiovascular;  Laterality: N/A;  . Tee without cardioversion N/A 11/12/2013    Procedure: TRANSESOPHAGEAL ECHOCARDIOGRAM (TEE);  Surgeon: Candee Furbish, MD;  Location: [facility] ENDOSCOPY;  Service: Cardiovascular;  Laterality: N/A;  . Tee without cardioversion N/A 11/19/2013    Procedure:  TRANSESOPHAGEAL ECHOCARDIOGRAM (TEE);  Surgeon: Jolaine Artist, MD;  Location: [facility] ENDOSCOPY;  Service: Cardiovascular;  Laterality: N/A;  . Cardioversion N/A 12/27/2013    Procedure: CARDIOVERSION;  Surgeon: Jolaine Artist, MD;  Location: [facility] ENDOSCOPY;  Service: Cardiovascular;  Laterality: N/A;  . Left and right heart catheterization with coronary angiogram N/A 07/02/2012    Procedure: LEFT AND RIGHT HEART CATHETERIZATION WITH CORONARY ANGIOGRAM;  Surgeon: Jolaine Artist, MD;  Location: [facility] CATH LAB;  Service: Cardiovascular;  Laterality: N/A;  . Right heart catheterization N/A 07/06/2012    Procedure: RIGHT HEART CATH;  Surgeon: Jolaine Artist, MD;  Location: [facility] CATH LAB;  Service: Cardiovascular;  Laterality: N/A;  . Bi-ventricular implantable cardioverter defibrillator N/A 10/26/2012    Procedure: BI-VENTRICULAR IMPLANTABLE CARDIOVERTER DEFIBRILLATOR  (CRT-D);  Surgeon: Evans Lance, MD;  Location: [facility] CATH LAB;  Service: Cardiovascular;  Laterality: N/A;   No family history on file. History  Substance Use Topics  . Smoking status: Never Smoker   . Smokeless tobacco: Never Used  . Alcohol Use: No    Review of Systems  Constitutional: Negative for fever and chills.  Respiratory: Negative for cough and shortness of breath.   Cardiovascular: Negative for chest pain.  Gastrointestinal: Positive for nausea, abdominal pain, constipation and abdominal distention. Negative for vomiting and diarrhea.  Genitourinary: Negative for dysuria, frequency and hematuria.  Musculoskeletal: Negative for myalgias, back pain, neck pain and neck stiffness.  Skin: Negative for rash and wound.  Neurological: Negative  for dizziness, weakness, light-headedness, numbness and headaches.  All other systems reviewed and are negative.     Allergies  Lisinopril  Home Medications   Prior to Admission medications   Medication Sig Start Date End Date Taking? Authorizing Provider  amiodarone  (PACERONE) 200 MG tablet Take 1 tablet (200 mg total) by mouth 2 (two) times daily. 11/22/13   [name] D Ninfa Meeker, NP  magnesium oxide (MAG-OX) 400 MG tablet Take 1 tablet (400 mg total) by mouth 2 (two) times daily. 04/25/14   Jolaine Artist, MD  metolazone (ZAROXOLYN) 5 MG tablet Take 1 tablet (5 mg total) by mouth 2 (two) times a week. Every Sunday and Thursday 04/21/14   [name] D Ninfa Meeker, NP  milrinone (PRIMACOR) 20 MG/100ML SOLN infusion Inject 26.775 mcg/min into the vein continuous. 11/22/13   [name] D Ninfa Meeker, NP  oxyCODONE 10 MG TABS Take 1 tablet (10 mg total) by mouth every 4 (four) hours as needed for severe pain. 03/07/14   Rande Brunt, NP  potassium chloride SA (K-DUR,KLOR-CON) 20 MEQ tablet Take 3 tablets (60 mEq total) by mouth 2 (two) times daily. 05/14/14   Rande Brunt, NP  senna-docusate (SENOKOT-S) 8.6-50 MG per tablet Take 1 tablet by mouth at bedtime. 03/07/14   Rande Brunt, NP  sildenafil (REVATIO) 20 MG tablet Take 1 tablet (20 mg total) by mouth 3 (three) times daily. 03/07/14   Rande Brunt, NP  sorbitol 70 % SOLN Take 30 mLs by mouth daily as needed for moderate constipation. 04/10/14   Rande Brunt, NP  spironolactone (ALDACTONE) 25 MG tablet Take 1 tablet (25 mg total) by mouth daily. 05/27/14   Jolaine Artist, MD  torsemide (DEMADEX) 20 MG tablet Take 4 tablets (80 mg total) by mouth daily. 04/25/14   Jolaine Artist, MD  torsemide (DEMADEX) 20 MG tablet TAKE 4 TABLETS BY MOUTH TWICE DAILY 05/27/14   Jolaine Artist, MD  traMADol (ULTRAM) 50 MG tablet Take 50 mg by mouth every 12 (twelve) hours as needed for moderate pain.    Historical Provider, MD  warfarin (COUMADIN) 4 MG tablet Take 2-4 mg by mouth daily. 4mg  daily except Friday and Sunday takes 2mg     Historical Provider, MD  warfarin (COUMADIN) 4 MG tablet TAKE AS DIRECTED BY COUMADIN CLINIC 05/14/14   Shaune Pascal Bensimhon, MD   BP 99/46 mmHg  Pulse 79  Temp(Src) 98 F (36.7 C) (Oral)  Resp 24  SpO2 97% Physical  Exam  Constitutional: He is oriented to person, place, and time. He appears well-developed and well-nourished. No distress.  HENT:  Head: Normocephalic and atraumatic.  Mouth/Throat: Oropharynx is clear and moist.  Eyes: EOM are normal. Pupils are equal, round, and reactive to light.  Neck: Normal range of motion. Neck supple.  Cardiovascular: Normal rate and regular rhythm.   Pulmonary/Chest: Effort normal and breath sounds normal. No respiratory distress. He has no wheezes. He has no rales.  Abdominal: Soft. Bowel sounds are normal. He exhibits distension. There is tenderness (mild diffuse abdominal tenderness.). There is no rebound and no guarding.  Musculoskeletal: Normal range of motion. He exhibits no edema or tenderness.  Neurological: He is alert and oriented to person, place, and time.  Skin: Skin is warm and dry. No rash noted. No erythema.  Psychiatric: He has a normal mood and affect. His behavior is normal.  Nursing note and vitals reviewed.   ED Course  Procedures (including critical care time) Labs Review Labs  Reviewed  CBC WITH DIFFERENTIAL - Abnormal; Notable for the following:    WBC 10.6 (*)    RBC 4.07 (*)    Hemoglobin 11.7 (*)    HCT 34.7 (*)    Neutrophils Relative % 79 (*)    Neutro Abs 8.4 (*)    Lymphocytes Relative 10 (*)    All other components within normal limits  COMPREHENSIVE METABOLIC PANEL - Abnormal; Notable for the following:    Sodium 132 (*)    Potassium 2.7 (*)    Chloride 85 (*)    Glucose, Bld 131 (*)    BUN 66 (*)    Creatinine, Ser 2.57 (*)    AST 46 (*)    ALT 78 (*)    Alkaline Phosphatase 160 (*)    GFR calc non Af Amer 22 (*)    GFR calc Af Amer 26 (*)    Anion gap 18 (*)    All other components within normal limits  LIPASE, BLOOD - Abnormal; Notable for the following:    Lipase 61 (*)    All other components within normal limits  URINALYSIS, ROUTINE W REFLEX MICROSCOPIC    Imaging Review No results found.   EKG  Interpretation None      MDM   Final diagnoses:  Abdominal pain   Signed out to oncoming emergency physician pending CT of the abdomen. Suspect constipation though given the patient's age will get CT scan to rule out abdominal pathology.     Julianne Rice, MD 05/31/14 [phone]

## 2014-05-31 NOTE — ED Notes (Signed)
Pt. reports constipation for 3 days with mid abdominal pain /nausea , denies fever or chills.

## 2014-06-03 ENCOUNTER — Telehealth: Payer: Self-pay | Admitting: Nurse Practitioner

## 2014-06-03 ENCOUNTER — Ambulatory Visit (INDEPENDENT_AMBULATORY_CARE_PROVIDER_SITE_OTHER): Payer: Medicare Other | Admitting: Cardiology

## 2014-06-03 DIAGNOSIS — I4891 Unspecified atrial fibrillation: Secondary | ICD-10-CM

## 2014-06-03 LAB — POCT INR: INR: 1.7

## 2014-06-03 NOTE — Telephone Encounter (Signed)
Home health RN called to report labs from earlier today.  BUN/Creat stable c/w labs on 12/2.  K+ remains low @ 2.7.  I rec that he take his usual PM dose of KCl 60 meq now.  Then take 40 meq in 2 hrs and 40 more meq 2 after after that.  Tomorrow, he is to begin taking 60 meq TID.  F/U BMET on 10/17.  Home health RN to contact pt with changes.

## 2014-06-04 ENCOUNTER — Encounter: Payer: Self-pay | Admitting: Internal Medicine

## 2014-06-04 ENCOUNTER — Ambulatory Visit (INDEPENDENT_AMBULATORY_CARE_PROVIDER_SITE_OTHER): Payer: Medicare Other | Admitting: *Deleted

## 2014-06-04 ENCOUNTER — Telehealth: Payer: Self-pay | Admitting: Cardiology

## 2014-06-04 DIAGNOSIS — I5022 Chronic systolic (congestive) heart failure: Secondary | ICD-10-CM

## 2014-06-04 DIAGNOSIS — I429 Cardiomyopathy, unspecified: Secondary | ICD-10-CM

## 2014-06-04 DIAGNOSIS — I428 Other cardiomyopathies: Secondary | ICD-10-CM

## 2014-06-04 DIAGNOSIS — Z95810 Presence of automatic (implantable) cardiac defibrillator: Secondary | ICD-10-CM

## 2014-06-04 LAB — MDC_IDC_ENUM_SESS_TYPE_INCLINIC
Battery Remaining Longevity: 78 mo
Brady Statistic AP VS Percent: 0.02 %
Brady Statistic AS VP Percent: 96.62 %
Brady Statistic AS VS Percent: 2.7 %
Brady Statistic RV Percent Paced: 96.71 %
Date Time Interrogation Session: 20151216160639
HIGH POWER IMPEDANCE MEASURED VALUE: 190 Ohm
HIGH POWER IMPEDANCE MEASURED VALUE: 89 Ohm
Lead Channel Impedance Value: 342 Ohm
Lead Channel Impedance Value: 494 Ohm
Lead Channel Impedance Value: 513 Ohm
Lead Channel Pacing Threshold Amplitude: 0.5 V
Lead Channel Pacing Threshold Amplitude: 0.625 V
Lead Channel Pacing Threshold Pulse Width: 0.4 ms
Lead Channel Pacing Threshold Pulse Width: 0.4 ms
Lead Channel Sensing Intrinsic Amplitude: 10.875 mV
Lead Channel Sensing Intrinsic Amplitude: 3.25 mV
Lead Channel Sensing Intrinsic Amplitude: 6.625 mV
Lead Channel Setting Pacing Amplitude: 1.5 V
Lead Channel Setting Pacing Amplitude: 2.25 V
Lead Channel Setting Pacing Pulse Width: 0.4 ms
MDC IDC MSMT BATTERY VOLTAGE: 2.96 V
MDC IDC MSMT LEADCHNL LV IMPEDANCE VALUE: 836 Ohm
MDC IDC MSMT LEADCHNL RA IMPEDANCE VALUE: 532 Ohm
MDC IDC MSMT LEADCHNL RA SENSING INTR AMPL: 1.375 mV
MDC IDC MSMT LEADCHNL RV PACING THRESHOLD AMPLITUDE: 0.875 V
MDC IDC MSMT LEADCHNL RV PACING THRESHOLD PULSEWIDTH: 0.4 ms
MDC IDC SET LEADCHNL LV PACING AMPLITUDE: 1.75 V
MDC IDC SET LEADCHNL LV PACING PULSEWIDTH: 0.4 ms
MDC IDC SET LEADCHNL RV SENSING SENSITIVITY: 0.3 mV
MDC IDC SET ZONE DETECTION INTERVAL: 300 ms
MDC IDC STAT BRADY AP VP PERCENT: 0.66 %
MDC IDC STAT BRADY RA PERCENT PACED: 0.68 %
Zone Setting Detection Interval: 350 ms
Zone Setting Detection Interval: 360 ms
Zone Setting Detection Interval: 450 ms

## 2014-06-04 NOTE — Progress Notes (Signed)
CRT-D device check in office due to RV defib impedance alert---changed max defib impedance from 100 to 130ohms. Thresholds and sensing consistent with previous device measurements. Other lead impedance trends stable over time. No mode switch episodes recorded. Pt was shocked for 3 separate episodes---all appropriately terminated w/ 1 shock---pt does not recall any episode. Patient bi-ventricularly pacing 99.9% of the time. Device programmed with appropriate safety margins. Heart failure diagnostics reviewed and trends are stable for patient---OptiVol up late Sept through late Oct. Estimated longevity 6.53yrs. ROV w/ Dr. Lovena Le 09/02/14.

## 2014-06-04 NOTE — Telephone Encounter (Signed)
[facility] nurse, along w/ interpreter, stated pt's device is beeping about 2x every 10 hrs.  Appt made w/ device clinic today @3 :00.

## 2014-06-04 NOTE — Telephone Encounter (Signed)
Pt nurse called an stated that pt told her that his device is beeping.

## 2014-06-05 ENCOUNTER — Emergency Department (HOSPITAL_COMMUNITY): Payer: Medicare Other

## 2014-06-05 ENCOUNTER — Encounter (HOSPITAL_COMMUNITY): Payer: Self-pay | Admitting: *Deleted

## 2014-06-05 ENCOUNTER — Inpatient Hospital Stay (HOSPITAL_COMMUNITY)
Admission: EM | Admit: 2014-06-05 | Discharge: 2014-06-10 | DRG: 292 | Disposition: A | Discharge status: Home Health | Payer: Medicare Other | Attending: Internal Medicine | Admitting: Internal Medicine

## 2014-06-05 DIAGNOSIS — I272 Other secondary pulmonary hypertension: Secondary | ICD-10-CM | POA: Diagnosis not present

## 2014-06-05 DIAGNOSIS — I4891 Unspecified atrial fibrillation: Secondary | ICD-10-CM | POA: Diagnosis present

## 2014-06-05 DIAGNOSIS — D72829 Elevated white blood cell count, unspecified: Secondary | ICD-10-CM | POA: Diagnosis not present

## 2014-06-05 DIAGNOSIS — Z8249 Family history of ischemic heart disease and other diseases of the circulatory system: Secondary | ICD-10-CM | POA: Diagnosis not present

## 2014-06-05 DIAGNOSIS — Z888 Allergy status to other drugs, medicaments and biological substances status: Secondary | ICD-10-CM | POA: Diagnosis not present

## 2014-06-05 DIAGNOSIS — Z7901 Long term (current) use of anticoagulants: Secondary | ICD-10-CM | POA: Diagnosis not present

## 2014-06-05 DIAGNOSIS — I10 Essential (primary) hypertension: Secondary | ICD-10-CM | POA: Diagnosis present

## 2014-06-05 DIAGNOSIS — Z8673 Personal history of transient ischemic attack (TIA), and cerebral infarction without residual deficits: Secondary | ICD-10-CM | POA: Diagnosis not present

## 2014-06-05 DIAGNOSIS — I480 Paroxysmal atrial fibrillation: Secondary | ICD-10-CM | POA: Diagnosis not present

## 2014-06-05 DIAGNOSIS — R0600 Dyspnea, unspecified: Secondary | ICD-10-CM

## 2014-06-05 DIAGNOSIS — Z91128 Patient's intentional underdosing of medication regimen for other reason: Secondary | ICD-10-CM | POA: Diagnosis not present

## 2014-06-05 DIAGNOSIS — Z23 Encounter for immunization: Secondary | ICD-10-CM | POA: Diagnosis not present

## 2014-06-05 DIAGNOSIS — Z95810 Presence of automatic (implantable) cardiac defibrillator: Secondary | ICD-10-CM

## 2014-06-05 DIAGNOSIS — I5023 Acute on chronic systolic (congestive) heart failure: Secondary | ICD-10-CM | POA: Diagnosis present

## 2014-06-05 DIAGNOSIS — I447 Left bundle-branch block, unspecified: Secondary | ICD-10-CM | POA: Diagnosis present

## 2014-06-05 DIAGNOSIS — Z9981 Dependence on supplemental oxygen: Secondary | ICD-10-CM

## 2014-06-05 DIAGNOSIS — R1900 Intra-abdominal and pelvic swelling, mass and lump, unspecified site: Secondary | ICD-10-CM | POA: Diagnosis present

## 2014-06-05 DIAGNOSIS — Z66 Do not resuscitate: Secondary | ICD-10-CM | POA: Diagnosis present

## 2014-06-05 DIAGNOSIS — I428 Other cardiomyopathies: Secondary | ICD-10-CM | POA: Diagnosis not present

## 2014-06-05 DIAGNOSIS — I252 Old myocardial infarction: Secondary | ICD-10-CM

## 2014-06-05 DIAGNOSIS — E876 Hypokalemia: Secondary | ICD-10-CM | POA: Diagnosis not present

## 2014-06-05 DIAGNOSIS — I5043 Acute on chronic combined systolic (congestive) and diastolic (congestive) heart failure: Principal | ICD-10-CM

## 2014-06-05 DIAGNOSIS — I5021 Acute systolic (congestive) heart failure: Secondary | ICD-10-CM

## 2014-06-05 DIAGNOSIS — R0682 Tachypnea, not elsewhere classified: Secondary | ICD-10-CM | POA: Diagnosis present

## 2014-06-05 DIAGNOSIS — R609 Edema, unspecified: Secondary | ICD-10-CM

## 2014-06-05 DIAGNOSIS — E871 Hypo-osmolality and hyponatremia: Secondary | ICD-10-CM | POA: Diagnosis present

## 2014-06-05 DIAGNOSIS — E780 Pure hypercholesterolemia: Secondary | ICD-10-CM | POA: Diagnosis not present

## 2014-06-05 DIAGNOSIS — I509 Heart failure, unspecified: Secondary | ICD-10-CM

## 2014-06-05 DIAGNOSIS — R079 Chest pain, unspecified: Secondary | ICD-10-CM

## 2014-06-05 DIAGNOSIS — I270 Primary pulmonary hypertension: Secondary | ICD-10-CM

## 2014-06-05 LAB — HEPATIC FUNCTION PANEL
ALBUMIN: 3.9 g/dL (ref 3.5–5.2)
ALK PHOS: 202 U/L — AB (ref 39–117)
ALT: 93 U/L — ABNORMAL HIGH (ref 0–53)
AST: 57 U/L — ABNORMAL HIGH (ref 0–37)
Bilirubin, Direct: <0.2 mg/dL (ref 0.0–0.3)
TOTAL PROTEIN: 8.1 g/dL (ref 6.0–8.3)
Total Bilirubin: 0.5 mg/dL (ref 0.3–1.2)

## 2014-06-05 LAB — PROTIME-INR
INR: 1.94 — ABNORMAL HIGH (ref 0.00–1.49)
Prothrombin Time: 22.3 seconds — ABNORMAL HIGH (ref 11.6–15.2)

## 2014-06-05 LAB — CBC
HCT: 36.9 % — ABNORMAL LOW (ref 39.0–52.0)
Hemoglobin: 12 g/dL — ABNORMAL LOW (ref 13.0–17.0)
MCH: 28 pg (ref 26.0–34.0)
MCHC: 32.5 g/dL (ref 30.0–36.0)
MCV: 86.2 fL (ref 78.0–100.0)
Platelets: 360 10*3/uL (ref 150–400)
RBC: 4.28 MIL/uL (ref 4.22–5.81)
RDW: 14.8 % (ref 11.5–15.5)
WBC: 16.8 10*3/uL — ABNORMAL HIGH (ref 4.0–10.5)

## 2014-06-05 LAB — DIFFERENTIAL
Basophils Absolute: 0 10*3/uL (ref 0.0–0.1)
Basophils Relative: 0 % (ref 0–1)
Eosinophils Absolute: 0.1 10*3/uL (ref 0.0–0.7)
Eosinophils Relative: 0 % (ref 0–5)
Lymphocytes Relative: 6 % — ABNORMAL LOW (ref 12–46)
Lymphs Abs: 1 10*3/uL (ref 0.7–4.0)
Monocytes Absolute: 1.1 10*3/uL — ABNORMAL HIGH (ref 0.1–1.0)
Monocytes Relative: 7 % (ref 3–12)
Neutro Abs: 14.5 10*3/uL — ABNORMAL HIGH (ref 1.7–7.7)
Neutrophils Relative %: 87 % — ABNORMAL HIGH (ref 43–77)

## 2014-06-05 LAB — BASIC METABOLIC PANEL
Anion gap: 19 — ABNORMAL HIGH (ref 5–15)
BUN: 67 mg/dL — ABNORMAL HIGH (ref 6–23)
CO2: 22 mEq/L (ref 19–32)
Calcium: 9.6 mg/dL (ref 8.4–10.5)
Chloride: 87 mEq/L — ABNORMAL LOW (ref 96–112)
Creatinine, Ser: 1.96 mg/dL — ABNORMAL HIGH (ref 0.50–1.35)
GFR calc Af Amer: 36 mL/min — ABNORMAL LOW (ref >90)
GFR calc non Af Amer: 31 mL/min — ABNORMAL LOW (ref >90)
Glucose, Bld: 207 mg/dL — ABNORMAL HIGH (ref 70–99)
Potassium: 4.8 mEq/L (ref 3.7–5.3)
Sodium: 128 mEq/L — ABNORMAL LOW (ref 137–147)

## 2014-06-05 LAB — PRO B NATRIURETIC PEPTIDE: Pro B Natriuretic peptide (BNP): 19533 pg/mL — ABNORMAL HIGH (ref 0–450)

## 2014-06-05 LAB — D-DIMER, QUANTITATIVE (NOT AT ARMC): D-Dimer, Quant: 0.59 ug/mL-FEU — ABNORMAL HIGH (ref 0.00–0.48)

## 2014-06-05 LAB — MRSA PCR SCREENING: MRSA by PCR: NEGATIVE

## 2014-06-05 LAB — CARBOXYHEMOGLOBIN
CARBOXYHEMOGLOBIN: 1.1 % (ref 0.5–1.5)
Methemoglobin: 0.8 % (ref 0.0–1.5)
O2 Saturation: 45.2 %
Total hemoglobin: 12.1 g/dL — ABNORMAL LOW (ref 13.5–18.0)

## 2014-06-05 LAB — I-STAT TROPONIN, ED: Troponin i, poc: 0.03 ng/mL (ref 0.00–0.08)

## 2014-06-05 LAB — LACTIC ACID, PLASMA: LACTIC ACID, VENOUS: 2.8 mmol/L — AB (ref 0.5–2.2)

## 2014-06-05 MED ORDER — MILRINONE IN DEXTROSE 20 MG/100ML IV SOLN: 0.3750 ug/kg/min | INTRAVENOUS | Status: DC

## 2014-06-05 MED ORDER — ONDANSETRON HCL 4 MG/2ML IJ SOLN: 4.0000 mg | Freq: Four times a day (QID) | INTRAMUSCULAR | Status: DC | PRN

## 2014-06-05 MED ORDER — WARFARIN - PHARMACIST DOSING INPATIENT
Freq: Every day | Status: DC
  Administered 2014-06-06: 18:00:00

## 2014-06-05 MED ORDER — MAGNESIUM OXIDE 400 (241.3 MG) MG PO TABS
400.0000 mg | ORAL_TABLET | Freq: Two times a day (BID) | ORAL | Status: DC
  Administered 2014-06-05 – 2014-06-10 (×10): 400 mg via ORAL
  Filled 2014-06-05 (×11): qty 1

## 2014-06-05 MED ORDER — SODIUM CHLORIDE 0.9 % IV SOLN: 250.0000 mL | INTRAVENOUS | Status: DC | PRN

## 2014-06-05 MED ORDER — FUROSEMIDE 10 MG/ML IJ SOLN
120.0000 mg | Freq: Two times a day (BID) | INTRAVENOUS | Status: DC
  Administered 2014-06-06 – 2014-06-07 (×4): 120 mg via INTRAVENOUS
  Filled 2014-06-05 (×5): qty 12

## 2014-06-05 MED ORDER — POTASSIUM CHLORIDE CRYS ER 20 MEQ PO TBCR
60.0000 meq | EXTENDED_RELEASE_TABLET | Freq: Two times a day (BID) | ORAL | Status: DC
  Administered 2014-06-05 – 2014-06-07 (×5): 60 meq via ORAL
  Filled 2014-06-05 (×7): qty 3

## 2014-06-05 MED ORDER — SODIUM CHLORIDE 0.9 % IJ SOLN
3.0000 mL | Freq: Two times a day (BID) | INTRAMUSCULAR | Status: DC
  Administered 2014-06-05 – 2014-06-09 (×7): 3 mL via INTRAVENOUS

## 2014-06-05 MED ORDER — SORBITOL 70 % SOLN
30.0000 mL | Freq: Every day | Status: DC | PRN
  Filled 2014-06-05: qty 30

## 2014-06-05 MED ORDER — SODIUM CHLORIDE 0.9 % IJ SOLN: 3.0000 mL | INTRAMUSCULAR | Status: DC | PRN

## 2014-06-05 MED ORDER — METOLAZONE 5 MG PO TABS
5.0000 mg | ORAL_TABLET | ORAL | Status: DC
  Administered 2014-06-05: 5 mg via ORAL
  Filled 2014-06-05 (×2): qty 1

## 2014-06-05 MED ORDER — ACETAMINOPHEN 325 MG PO TABS: 650.0000 mg | ORAL_TABLET | ORAL | Status: DC | PRN

## 2014-06-05 MED ORDER — SILDENAFIL CITRATE 20 MG PO TABS
20.0000 mg | ORAL_TABLET | Freq: Three times a day (TID) | ORAL | Status: DC
  Administered 2014-06-05 – 2014-06-10 (×15): 20 mg via ORAL
  Filled 2014-06-05 (×17): qty 1

## 2014-06-05 MED ORDER — SPIRONOLACTONE 25 MG PO TABS
25.0000 mg | ORAL_TABLET | Freq: Every day | ORAL | Status: DC
  Administered 2014-06-06 – 2014-06-10 (×5): 25 mg via ORAL
  Filled 2014-06-05 (×6): qty 1

## 2014-06-05 MED ORDER — SODIUM CHLORIDE 0.9 % IV SOLN
INTRAVENOUS | Status: DC
  Administered 2014-06-05: 18:00:00 via INTRAVENOUS

## 2014-06-05 MED ORDER — WARFARIN SODIUM 4 MG PO TABS
4.0000 mg | ORAL_TABLET | Freq: Once | ORAL | Status: AC
  Administered 2014-06-05: 4 mg via ORAL
  Filled 2014-06-05: qty 1

## 2014-06-05 MED ORDER — SENNOSIDES-DOCUSATE SODIUM 8.6-50 MG PO TABS
2.0000 | ORAL_TABLET | Freq: Every day | ORAL | Status: DC
  Administered 2014-06-06 – 2014-06-10 (×5): 2 via ORAL
  Filled 2014-06-05 (×5): qty 2

## 2014-06-05 MED ORDER — MILRINONE IN DEXTROSE 20 MG/100ML IV SOLN
0.3750 ug/kg/min | INTRAVENOUS | Status: DC
  Administered 2014-06-05 – 2014-06-06 (×2): 0.375 ug/kg/min via INTRAVENOUS
  Filled 2014-06-05 (×2): qty 100

## 2014-06-05 MED ORDER — AMIODARONE HCL 200 MG PO TABS
200.0000 mg | ORAL_TABLET | Freq: Two times a day (BID) | ORAL | Status: DC
  Administered 2014-06-05 – 2014-06-10 (×10): 200 mg via ORAL
  Filled 2014-06-05 (×11): qty 1

## 2014-06-05 MED ORDER — TRAMADOL HCL 50 MG PO TABS: 50.0000 mg | ORAL_TABLET | Freq: Two times a day (BID) | ORAL | Status: DC | PRN

## 2014-06-05 MED ORDER — FUROSEMIDE 10 MG/ML IJ SOLN
40.0000 mg | Freq: Once | INTRAMUSCULAR | Status: DC
  Filled 2014-06-05: qty 4

## 2014-06-05 MED ORDER — FUROSEMIDE 10 MG/ML IJ SOLN
80.0000 mg | Freq: Once | INTRAMUSCULAR | Status: AC
  Administered 2014-06-05: 80 mg via INTRAVENOUS
  Filled 2014-06-05: qty 8

## 2014-06-05 NOTE — Progress Notes (Signed)
*  Preliminary Results* Right lower extremity venous duplex completed. Right lower extremity is negative for deep vein thrombosis. There is no evidence of right Baker's cyst.  06/05/2014 9:53 AM  Maudry Mayhew, RVT, RDCS, RDMS

## 2014-06-05 NOTE — Progress Notes (Signed)
eLink Physician-Brief Progress Note Patient Name: Yaakov Saindon DOB: September 17, 1932 MRN: [id number]   Date of Service  06/05/2014  HPI/Events of Note  Acute on chronic CHF exacerbation admitted by heart failure service  VSS O2 saturation 100% Stable on camera check, breathing comfortably  eICU Interventions  No eICU intervention     Intervention Category Evaluation Type: New Patient Evaluation  Mohmed Farver 06/05/2014, 3:56 PM

## 2014-06-05 NOTE — Progress Notes (Signed)
[facility]  Patient Status: Active patient with [facility] prior to admission  [facility] is providing the following services: [place] for home Milrinone.  If patient discharges after hours, please call [phone].   Larry Sierras 06/05/2014, 1:37 PM

## 2014-06-05 NOTE — ED Notes (Signed)
Attempted report x1. 

## 2014-06-05 NOTE — Progress Notes (Signed)
ANTICOAGULATION CONSULT NOTE - Initial Consult  Pharmacy Consult for Warfarin Indication: atrial fibrillation  Allergies  Allergen Reactions  . Lisinopril Rash  Patient Measurements: Height: 5\' 6"  (167.6 cm) Weight: 151 lb 10.8 oz (68.8 kg) IBW/kg (Calculated) : 63.8 Vital Signs: BP: 101/59 mmHg (12/17 1500) Pulse Rate: 71 (12/17 1500)  Labs:  Recent Labs  06/03/14 06/05/14 0324  HGB  --  12.0*  HCT  --  36.9*  PLT  --  360  LABPROT  --  22.3*  INR 1.7 1.94*  CREATININE  --  1.96*    Estimated Creatinine Clearance: 27.6 mL/min (by C-G formula based on Cr of 1.96).   Medical History: Past Medical History  Diagnosis Date  . HTN (hypertension)   . High cholesterol   . CHF (congestive heart failure)   . BBBB (bilateral bundle branch block)   . LBBB (left bundle branch block)   . Seizures 06/11/2012    new onset/notes (06/11/2012)  . SOB (shortness of breath)     "sometimes when I lay down;; related to not taking my RX" (06/11/2012)  . Myocardial infarction     06/10/2012  . Atrial fibrillation   . Stroke   . Atrial thrombus     left  . Pulmonary HTN     Medications:  Prescriptions prior to admission  Medication Sig Dispense Refill Last Dose  . amiodarone (PACERONE) 200 MG tablet Take 1 tablet (200 mg total) by mouth 2 (two) times daily. 60 tablet 6 06/04/2014 at Unknown time  . magnesium oxide (MAG-OX) 400 MG tablet Take 1 tablet (400 mg total) by mouth 2 (two) times daily. 60 tablet 3 06/04/2014 at Unknown time  . metolazone (ZAROXOLYN) 5 MG tablet Take 1 tablet (5 mg total) by mouth 2 (two) times a week. Every Sunday and Thursday 60 tablet 0 06/04/2014 at Unknown time  . potassium chloride SA (K-DUR,KLOR-CON) 20 MEQ tablet Take 3 tablets (60 mEq total) by mouth 2 (two) times daily. 180 tablet 3 06/04/2014 at Unknown time  . senna-docusate (SENOKOT-S) 8.6-50 MG per tablet Take 2 tablets by mouth daily. 20 tablet 0 06/04/2014 at Unknown time  . sildenafil  (REVATIO) 20 MG tablet Take 1 tablet (20 mg total) by mouth 3 (three) times daily. 90 tablet 3 06/04/2014 at Unknown time  . sorbitol 70 % SOLN Take 30 mLs by mouth daily as needed for moderate constipation. 500 mL 3 06/04/2014 at Unknown time  . spironolactone (ALDACTONE) 25 MG tablet Take 1 tablet (25 mg total) by mouth daily. 30 tablet 3 06/04/2014 at Unknown time  . torsemide (DEMADEX) 20 MG tablet Take 4 tablets (80 mg total) by mouth daily. 120 tablet 3 06/04/2014 at Unknown time  . torsemide (DEMADEX) 20 MG tablet TAKE 4 TABLETS BY MOUTH TWICE DAILY 240 tablet 3 06/04/2014 at Unknown time  . traMADol (ULTRAM) 50 MG tablet TAKE 1 TABLET BY MOUTH TWICE DAILY AS NEEDED FOR PAIN 60 tablet 0 06/04/2014 at Unknown time  . warfarin (COUMADIN) 4 MG tablet Take 4 mg by mouth daily.   06/04/2014 at Unknown time  . milrinone (PRIMACOR) 20 MG/100ML SOLN infusion Inject 26.775 mcg/min into the vein continuous. 100 mL 6 continuous  . sodium phosphate (FLEET) enema Place 1 enema rectally once. follow package directions 135 mL 0 unknown  . warfarin (COUMADIN) 4 MG tablet Take 2-4 mg by mouth daily. 4mg  daily except Friday and Sunday takes 2mg    Taking  . warfarin (COUMADIN) 4 MG tablet TAKE  AS DIRECTED BY COUMADIN CLINIC 35 tablet 0 Taking    Assessment: 80 year old male with acute on chronic CHF admitted by the HF service to resume chronic warfarin for atrial fibrillation.   Anticoagulation: INR 1.94 on admission. H/H is within normal limits. Platelets are 360. No bleeding reported.   Home regimen was 4mg  po daily with last dose on 06/04/14.   Goal of Therapy:  INR 2-3 Monitor platelets by anticoagulation protocol: Yes   Plan:  Warfarin 4mg  po x1 tonight. Daily PT/INR.   Sloan Leiter, PharmD, BCPS Clinical Pharmacist [phone] 06/05/2014,5:09 PM

## 2014-06-05 NOTE — ED Notes (Signed)
Patient states the pain in the right side of the chest started around 7:00pm last night with SOB.  Denies fever and cough

## 2014-06-05 NOTE — ED Notes (Signed)
Pt returned from [name] without distress. Placed back on monitor.

## 2014-06-05 NOTE — ED Notes (Signed)
Patient transported to Ultrasound in wheelchair bc pt can only sit up on edge of bed and feet below.

## 2014-06-05 NOTE — ED Notes (Signed)
Dr. Nanavati at bedside 

## 2014-06-05 NOTE — ED Notes (Signed)
Warm blanket given; no distress or new needs.

## 2014-06-05 NOTE — ED Provider Notes (Signed)
CSN: [id number]     Arrival date & time 06/05/14  0302 History   First MD Initiated Contact with Patient 06/05/14 0505     Chief Complaint  Patient presents with  . Chest Pain  . Shortness of Breath     (Consider location/radiation/quality/duration/timing/severity/associated sxs/prior Treatment) Patient is a 80 y.o. male presenting with chest pain and shortness of breath. The history is provided by the patient. A language interpreter was used (via [employer]).  Chest Pain Pain location:  R chest Pain radiates to:  R arm Pain severity:  Moderate Associated symptoms: nausea and shortness of breath   Associated symptoms: no abdominal pain, no cough, no fever and not vomiting   Associated symptoms comment:  Right sided chest pain that started last night around 7:00 p.m. The pain has improved and is only a small pain now. He had SOB that is significant since yesterday as well, and the SOB continues without improvement. He reports increased swelling in his legs and tightness of abdomen. No fevers. He has had nausea without vomiting since chest pain began. Shortness of Breath Associated symptoms: chest pain   Associated symptoms: no abdominal pain, no cough, no fever and no vomiting     Past Medical History  Diagnosis Date  . HTN (hypertension)   . High cholesterol   . CHF (congestive heart failure)   . BBBB (bilateral bundle branch block)   . LBBB (left bundle branch block)   . Seizures 06/11/2012    new onset/notes (06/11/2012)  . SOB (shortness of breath)     "sometimes when I lay down;; related to not taking my RX" (06/11/2012)  . Myocardial infarction     06/10/2012  . Atrial fibrillation   . Stroke   . Atrial thrombus     left  . Pulmonary HTN    Past Surgical History  Procedure Laterality Date  . Throat surgery  1942    "and nose" (06/11/2012); ?T&A  . Inguinal hernia repair  ~ 2008    "both sides" (06/11/2012)  . Tee without cardioversion  06/29/2012   Procedure: TRANSESOPHAGEAL ECHOCARDIOGRAM (TEE);  Surgeon: Jolaine Artist, MD;  Location: [facility] ENDOSCOPY;  Service: Cardiovascular;  Laterality: N/A;  will need a spanish interpreter Interpreter will be here at 1330-Hope spoke w/ [place]  . Cardioversion N/A 08/14/2012    Procedure: CARDIOVERSION;  Surgeon: Jolaine Artist, MD;  Location: [facility] ENDOSCOPY;  Service: Cardiovascular;  Laterality: N/A;  . Tee without cardioversion N/A 11/12/2013    Procedure: TRANSESOPHAGEAL ECHOCARDIOGRAM (TEE);  Surgeon: Candee Furbish, MD;  Location: [facility] ENDOSCOPY;  Service: Cardiovascular;  Laterality: N/A;  . Tee without cardioversion N/A 11/19/2013    Procedure: TRANSESOPHAGEAL ECHOCARDIOGRAM (TEE);  Surgeon: Jolaine Artist, MD;  Location: [facility] ENDOSCOPY;  Service: Cardiovascular;  Laterality: N/A;  . Cardioversion N/A 12/27/2013    Procedure: CARDIOVERSION;  Surgeon: Jolaine Artist, MD;  Location: [facility] ENDOSCOPY;  Service: Cardiovascular;  Laterality: N/A;  . Left and right heart catheterization with coronary angiogram N/A 07/02/2012    Procedure: LEFT AND RIGHT HEART CATHETERIZATION WITH CORONARY ANGIOGRAM;  Surgeon: Jolaine Artist, MD;  Location: [facility] CATH LAB;  Service: Cardiovascular;  Laterality: N/A;  . Right heart catheterization N/A 07/06/2012    Procedure: RIGHT HEART CATH;  Surgeon: Jolaine Artist, MD;  Location: [facility] CATH LAB;  Service: Cardiovascular;  Laterality: N/A;  . Bi-ventricular implantable cardioverter defibrillator N/A 10/26/2012    Procedure: BI-VENTRICULAR IMPLANTABLE CARDIOVERTER DEFIBRILLATOR  (CRT-D);  Surgeon: Evans Lance,  MD;  Location: [facility] CATH LAB;  Service: Cardiovascular;  Laterality: N/A;   No family history on file. History  Substance Use Topics  . Smoking status: Never Smoker   . Smokeless tobacco: Never Used  . Alcohol Use: No    Review of Systems  Constitutional: Negative for fever and chills.  HENT: Negative.  Negative for congestion.   Respiratory: Positive for  shortness of breath. Negative for cough.   Cardiovascular: Positive for chest pain and leg swelling.  Gastrointestinal: Positive for nausea. Negative for vomiting and abdominal pain.  Musculoskeletal: Negative.   Skin: Negative.   Neurological: Negative.   Hematological: Bruises/bleeds easily.  Psychiatric/Behavioral: Negative for confusion.      Allergies  Lisinopril  Home Medications   Prior to Admission medications   Medication Sig Start Date End Date Taking? Authorizing Provider  amiodarone (PACERONE) 200 MG tablet Take 1 tablet (200 mg total) by mouth 2 (two) times daily. 11/22/13  Yes [name] D Clegg, NP  magnesium oxide (MAG-OX) 400 MG tablet Take 1 tablet (400 mg total) by mouth 2 (two) times daily. 04/25/14  Yes Jolaine Artist, MD  metolazone (ZAROXOLYN) 5 MG tablet Take 1 tablet (5 mg total) by mouth 2 (two) times a week. Every Sunday and Thursday 04/21/14  Yes [name] D Clegg, NP  potassium chloride SA (K-DUR,KLOR-CON) 20 MEQ tablet Take 3 tablets (60 mEq total) by mouth 2 (two) times daily. 05/14/14  Yes Rande Brunt, NP  senna-docusate (SENOKOT-S) 8.6-50 MG per tablet Take 2 tablets by mouth daily. 05/31/14  Yes Carmin Muskrat, MD  sildenafil (REVATIO) 20 MG tablet Take 1 tablet (20 mg total) by mouth 3 (three) times daily. 03/07/14  Yes Rande Brunt, NP  sorbitol 70 % SOLN Take 30 mLs by mouth daily as needed for moderate constipation. 04/10/14  Yes Rande Brunt, NP  spironolactone (ALDACTONE) 25 MG tablet Take 1 tablet (25 mg total) by mouth daily. 05/27/14  Yes Jolaine Artist, MD  torsemide (DEMADEX) 20 MG tablet Take 4 tablets (80 mg total) by mouth daily. 04/25/14  Yes Jolaine Artist, MD  torsemide (DEMADEX) 20 MG tablet TAKE 4 TABLETS BY MOUTH TWICE DAILY 05/27/14  Yes Jolaine Artist, MD  traMADol (ULTRAM) 50 MG tablet TAKE 1 TABLET BY MOUTH TWICE DAILY AS NEEDED FOR PAIN 06/02/14  Yes Jolaine Artist, MD  warfarin (COUMADIN) 4 MG tablet Take 4 mg by mouth  daily.   Yes Historical Provider, MD  milrinone (PRIMACOR) 20 MG/100ML SOLN infusion Inject 26.775 mcg/min into the vein continuous. 11/22/13   [name] D Ninfa Meeker, NP  sodium phosphate (FLEET) enema Place 1 enema rectally once. follow package directions 05/31/14   Carmin Muskrat, MD  warfarin (COUMADIN) 4 MG tablet Take 2-4 mg by mouth daily. 4mg  daily except Friday and Sunday takes 2mg     Historical Provider, MD  warfarin (COUMADIN) 4 MG tablet TAKE AS DIRECTED BY COUMADIN CLINIC 05/14/14   Shaune Pascal Bensimhon, MD   BP 95/60 mmHg  Pulse 73  Temp(Src) 97.5 F (36.4 C) (Oral)  Resp 19  Ht 5\' 6"  (1.676 m)  Wt 151 lb (68.493 kg)  BMI 24.38 kg/m2  SpO2 100% Physical Exam  Constitutional: He is oriented to person, place, and time. He appears well-developed and well-nourished.  Elderly male patient, refusing to recline, sitting straight up, NAD.  HENT:  Head: Normocephalic.  Eyes: Conjunctivae are normal.  Neck: Normal range of motion.  Cardiovascular: Normal rate.   No murmur  heard. Pulmonary/Chest: He exhibits no tenderness.  Prolonged expirations. Rales bilaterally.  Abdominal: Soft. There is no tenderness.  Musculoskeletal: He exhibits edema.  Neurological: He is alert and oriented to person, place, and time.  Skin: Skin is warm and dry.  Psychiatric: He has a normal mood and affect.    ED Course  Procedures (including critical care time) Labs Review Labs Reviewed  CBC - Abnormal; Notable for the following:    WBC 16.8 (*)    Hemoglobin 12.0 (*)    HCT 36.9 (*)    All other components within normal limits  BASIC METABOLIC PANEL - Abnormal; Notable for the following:    Sodium 128 (*)    Chloride 87 (*)    Glucose, Bld 207 (*)    BUN 67 (*)    Creatinine, Ser 1.96 (*)    GFR calc non Af Amer 31 (*)    GFR calc Af Amer 36 (*)    Anion gap 19 (*)    All other components within normal limits  PRO B NATRIURETIC PEPTIDE - Abnormal; Notable for the following:    Pro B Natriuretic  peptide (BNP) 19533.0 (*)    All other components within normal limits  PROTIME-INR - Abnormal; Notable for the following:    Prothrombin Time 22.3 (*)    INR 1.94 (*)    All other components within normal limits  DIFFERENTIAL - Abnormal; Notable for the following:    Neutrophils Relative % 87 (*)    Neutro Abs 14.5 (*)    Lymphocytes Relative 6 (*)    Monocytes Absolute 1.1 (*)    All other components within normal limits  I-STAT TROPOININ, ED    Imaging Review Dg Chest Port 1 View  06/05/2014   CLINICAL DATA:  Chest pain  EXAM: PORTABLE CHEST - 1 VIEW  COMPARISON:  03/24/2014  FINDINGS: Unchanged orientation of biventricular ICD/ pacer leads from the left. There is a right IJ catheter, tip at the upper right atrium.  Unchanged cardiomegaly. Stable aortic and hilar contours. There is pulmonary venous congestion with interstitial coarsening and few faint Kerley. Lines Previously noted pleural effusions have resolved. No pneumothorax.  IMPRESSION: Mild CHF.   Electronically Signed   By: Jorje Guild M.D.   On: 06/05/2014 05:13     EKG Interpretation   Date/Time:  Thursday June 05 2014 03:13:50 EST Ventricular Rate:  78 PR Interval:  159 QRS Duration: 198 QT Interval:  528 QTC Calculation: 602 R Axis:   -113 Text Interpretation:  Sinus rhythm Nonspecific IVCD with LAD non specific  ST and T wave changes No acute changes Confirmed by Kathrynn Humble, MD, ANKIT  ([id number]) on 06/05/2014 3:40:18 AM      MDM   Final diagnoses:  Chest pain    1. Dyspnea 2. CHF 3. H/o Pulm. HTN on milrinone  Discussed with cardiology who will see the patient in the ED. Anticipate admission.    Dewaine Oats, PA-C 06/05/14 0600  Varney Biles, MD 06/06/14 [phone]

## 2014-06-05 NOTE — ED Notes (Signed)
Patient presents stating about 1 hour PTA he started with right sided chest pain that radiates to his right arm.  States he has been SOB and nauseated.

## 2014-06-05 NOTE — ED Notes (Addendum)
Cardiology at bedside. Interpreter phone used

## 2014-06-05 NOTE — ED Notes (Addendum)
#  [id number] Int used to do triage.

## 2014-06-05 NOTE — ED Notes (Signed)
MD alerted cards has not come yet.

## 2014-06-05 NOTE — ED Notes (Signed)
Spoke to advanced home care nurse regarding pt admission, states she will follow up with pt once he gets to room

## 2014-06-05 NOTE — H&P (Signed)
Patient ID: Tyler Christensen MRN: [id number], DOB/AGE: Oct 24, 1932   Admit date: 06/05/2014   Primary Physician: Harvie Junior, MD Primary Cardiologist: Dr. Haroldine Laws  Pt. 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 HTN (hypertension)   . High cholesterol   . CHF (congestive heart failure)   . BBBB (bilateral bundle branch block)   . LBBB (left bundle branch block)   . Seizures 06/11/2012    new onset/notes (06/11/2012)  . SOB (shortness of breath)     "sometimes when I lay down;; related to not taking my RX" (06/11/2012)  . Myocardial infarction     06/10/2012  . Atrial fibrillation   . Stroke   . Atrial thrombus     left  . Pulmonary HTN     Past Surgical History  Procedure Laterality Date  . Throat surgery  1942    "and nose" (06/11/2012); ?T&A  . Inguinal hernia repair  ~ 2008    "both sides" (06/11/2012)  . Tee without cardioversion  06/29/2012    Procedure: TRANSESOPHAGEAL ECHOCARDIOGRAM (TEE);  Surgeon: Jolaine Artist, MD;  Location: [facility] ENDOSCOPY;  Service: Cardiovascular;  Laterality: N/A;  will need a spanish interpreter Interpreter will be here at 1330-Hope spoke w/ [place]  . Cardioversion N/A 08/14/2012    Procedure: CARDIOVERSION;  Surgeon: Jolaine Artist, MD;  Location: [facility] ENDOSCOPY;  Service: Cardiovascular;  Laterality: N/A;  . Tee without cardioversion N/A 11/12/2013    Procedure: TRANSESOPHAGEAL ECHOCARDIOGRAM (TEE);  Surgeon: Candee Furbish, MD;  Location: [facility] ENDOSCOPY;  Service: Cardiovascular;  Laterality: N/A;  . Tee without cardioversion N/A 11/19/2013    Procedure: TRANSESOPHAGEAL ECHOCARDIOGRAM (TEE);  Surgeon: Jolaine Artist, MD;  Location: [facility] ENDOSCOPY;   Service: Cardiovascular;  Laterality: N/A;  . Cardioversion N/A 12/27/2013    Procedure: CARDIOVERSION;  Surgeon: Jolaine Artist, MD;  Location: [facility] ENDOSCOPY;  Service: Cardiovascular;  Laterality: N/A;  . Left and right heart catheterization with coronary angiogram N/A 07/02/2012    Procedure: LEFT AND RIGHT HEART CATHETERIZATION WITH CORONARY ANGIOGRAM;  Surgeon: Jolaine Artist, MD;  Location: [facility] CATH LAB;  Service: Cardiovascular;  Laterality: N/A;  . Right heart catheterization N/A 07/06/2012    Procedure: RIGHT HEART CATH;  Surgeon: Jolaine Artist, MD;  Location: [facility] CATH LAB;  Service: Cardiovascular;  Laterality: N/A;  . Bi-ventricular implantable cardioverter defibrillator N/A 10/26/2012    Procedure: BI-VENTRICULAR IMPLANTABLE CARDIOVERTER DEFIBRILLATOR  (CRT-D);  Surgeon: Evans Lance, MD;  Location: [facility] CATH LAB;  Service: Cardiovascular;  Laterality: N/A;     Allergies  Allergies  Allergen Reactions  . 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 Patient is also a DO NOT RESUSCITATE, and does not wish for palliative care. He underwent a right heart catheterization on 07/02/2012 which showed cardiac index 1.1, cardiac output 1.9, PCV 15, RV 94/10/13. He was placed on Rivatio and IV Primacor. Repeat right heart cath on 07/06/2012 showed significant improvement. He had V/Q scan showed low probability for pulmonary  emboli. We have attempted to wean milrinone in 9/14 but failed due to worsening HF and PAH and milrinone was restarted. He was admitted in hospital in May 2015 for staph aureus bacteremia treated with IV antibiotics. TEE was negative for endocarditis. He was found to have a retroperitoneal mass, biopsy revealed benign lesion. He had  evidence of low output due to atrial fibrillation and Primacor was increased. TEE was obtained in June 2015, he was unable to be cardioverted at the time due to significant left atrial clot burden. He was placed on systemic anticoagulation therapy and underwent cardioversion in July 2015. Patient has been on high-dose Demadex 80 mg twice a day at home along with twice weekly metolazone. He is also on home oxygen 24 hours day. His last echocardiogram on 03/19/2014 showed EF 71-06%, grade 3 diastolic dysfunction, moderate MR/TR, PA peak pressure 76. His last Medtronic Optivol interrogation was on the night 06/04/2014, which surprisingly showed patient has been stable for heart failure perspective.  Patient presented to [facility] on 06/05/2014 with complaint of worsening shortness of breath. He has not been active at all at home and cannot walk very far due to shortness of breath. He denies any recent fever or cough, however does endorse chronic chill. He also have significant diffuse abdominal tenderness, lower extremity edema, and increasing shortness of breath since the morning of 12/16. He states he has not urinated much for the past 2 days. He sleep at elevated angle chronically, and denies any recent paroxysmal nocturnal dyspnea. On arrival to [facility] ED, his blood pressure was 110/69. O2 saturation 96% on room air. Heart rate of 70s. Significant laboratory finding include sodium 128, potassium 3.8, creatinine 1.96, proBNP 19,500, POC troponin negative, white blood cell count 16.8, hemoglobin 12.0. INR 1.94. Chest x-ray was consistent for mild heart failure. D-dimer is mildly elevated at 0.59. Lower extremity venous Doppler was obtained which was negative for DVT. Cardiology has been consulted for heart failure.  Home Medications  Prior to Admission medications   Medication Sig Start Date End Date Taking? Authorizing Provider  amiodarone (PACERONE) 200 MG tablet Take 1 tablet (200 mg total) by  mouth 2 (two) times daily. 11/22/13  Yes [name] D Clegg, NP  magnesium oxide (MAG-OX) 400 MG tablet Take 1 tablet (400 mg total) by mouth 2 (two) times daily. 04/25/14  Yes Jolaine Artist, MD  metolazone (ZAROXOLYN) 5 MG tablet Take 1 tablet (5 mg total) by mouth 2 (two) times a week. Every Sunday and Thursday 04/21/14  Yes [name] D Clegg, NP  potassium chloride SA (K-DUR,KLOR-CON) 20 MEQ tablet Take 3 tablets (60 mEq total) by mouth 2 (two) times daily. 05/14/14  Yes Rande Brunt, NP  senna-docusate (SENOKOT-S) 8.6-50 MG per tablet Take 2 tablets by mouth daily. 05/31/14  Yes Carmin Muskrat, MD  sildenafil (REVATIO) 20 MG tablet Take 1 tablet (20 mg total) by mouth 3 (three) times daily. 03/07/14  Yes Rande Brunt, NP  sorbitol 70 % SOLN Take 30 mLs by mouth daily as needed for moderate constipation. 04/10/14  Yes Rande Brunt, NP  spironolactone (ALDACTONE) 25 MG tablet Take 1 tablet (25 mg total) by mouth daily. 05/27/14  Yes Jolaine Artist, MD  torsemide (DEMADEX) 20 MG tablet Take 4 tablets (80 mg total) by mouth daily. 04/25/14  Yes Jolaine Artist, MD  torsemide (DEMADEX) 20 MG tablet TAKE 4 TABLETS BY MOUTH TWICE DAILY 05/27/14  Yes Jolaine Artist, MD  traMADol (  ULTRAM) 50 MG tablet TAKE 1 TABLET BY MOUTH TWICE DAILY AS NEEDED FOR PAIN 06/02/14  Yes Jolaine Artist, MD  warfarin (COUMADIN) 4 MG tablet Take 4 mg by mouth daily.   Yes Historical Provider, MD  milrinone (PRIMACOR) 20 MG/100ML SOLN infusion Inject 26.775 mcg/min into the vein continuous. 11/22/13   [name] D Ninfa Meeker, NP  sodium phosphate (FLEET) enema Place 1 enema rectally once. follow package directions 05/31/14   Carmin Muskrat, MD  warfarin (COUMADIN) 4 MG tablet Take 2-4 mg by mouth daily. 4mg  daily except Friday and Sunday takes 2mg     Historical Provider, MD  warfarin (COUMADIN) 4 MG tablet TAKE AS DIRECTED BY COUMADIN CLINIC 05/14/14   Jolaine Artist, MD    Family History  No family history on file.  Social  History  History   Social History  . Marital Status: Divorced    Spouse Name: N/A    Number of Children: N/A  . Years of Education: N/A   Occupational History  . Not on file.   Social History Main Topics  . Smoking status: Never Smoker   . Smokeless tobacco: Never Used  . Alcohol Use: No  . Drug Use: No  . Sexual Activity: No   Other Topics Concern  . Not on file   Social History Narrative     Review of Systems General:  No fever, night sweats or weight changes. +chill Cardiovascular:  No chest pain. +dyspnea on exertion, edema, orthopnea Dermatological: No rash, lesions/masses Respiratory: No cough +dyspnea Urologic: No hematuria, dysuria Abdominal:   No nausea, vomiting, diarrhea, bright red blood per rectum, melena, or hematemesis. Abdominal distention and pain. Neurologic:  No visual changes, wkns, changes in mental status. All other systems reviewed and are otherwise negative except as noted above.  Physical Exam  Blood pressure 98/63, pulse 69, temperature 97.5 F (36.4 C), temperature source Oral, resp. rate 19, height 5\' 6"  (1.676 m), weight 151 lb (68.493 kg), SpO2 94 %.  General: Pleasant, NAD Psych: Normal affect. Neuro: Alert and oriented X 3. Moves all extremities spontaneously. HEENT: Normal  Neck: Supple without bruits. Mild JVD Lungs:  Resp regular and unlabored, largely clear, mild rale in bilateral base Heart: RRR no + s3 Abdomen: Soft, BS + x 4. Mildly distended, diffusely tender Extremities: No clubbing, cyanosis. DP/PT/Radials 2+ and equal bilaterally. 2+ pitting edema in bilateral LE. Very cool  Labs  Troponin (Point of Care Test)  Recent Labs  06/05/14 0334  TROPIPOC 0.03   No results for input(s): CKTOTAL, CKMB, TROPONINI in the last 72 hours. Lab Results  Component Value Date   WBC 16.8* 06/05/2014   HGB 12.0* 06/05/2014   HCT 36.9* 06/05/2014   MCV 86.2 06/05/2014   PLT 360 06/05/2014    Recent Labs Lab 06/05/14 0324  06/05/14 0624  NA 128*  --   K 4.8  --   CL 87*  --   CO2 22  --   BUN 67*  --   CREATININE 1.96*  --   CALCIUM 9.6  --   PROT  --  8.1  BILITOT  --  0.5  ALKPHOS  --  202*  ALT  --  93*  AST  --  57*  GLUCOSE 207*  --    Lab Results  Component Value Date   CHOL 128 06/12/2012   HDL 27* 06/12/2012   LDLCALC 78 06/12/2012   TRIG 113 06/12/2012   Lab Results  Component Value Date  DDIMER 0.59* 06/05/2014     Radiology/Studies  Ct Abdomen Pelvis Wo Contrast  05/31/2014   CLINICAL DATA:  80 year old male with 3 day history of mid abdominal pain, nausea and constipation  EXAM: CT ABDOMEN AND PELVIS WITHOUT CONTRAST  TECHNIQUE: Multidetector CT imaging of the abdomen and pelvis was performed following the standard protocol without IV contrast.  COMPARISON:  Prior CT abdomen/ pelvis 03/17/2014  FINDINGS: Lower Chest: Dependent atelectasis in the bilateral lower lobes. In the periphery of the left lower lobe atelectasis is somewhat rounded and almost nodular in appearance measuring up to 10 mm. Similarly, there is more focal consolidation in the most inferior aspect of the left lower lobe in the costophrenic sulcus posteriorly. Marked cardiomegaly. Incompletely imaged biventricular cardiac rhythm maintenance device. No pericardial effusion. Small hiatal hernia.  Abdomen: Unenhanced CT was performed per clinician order. Lack of IV contrast limits sensitivity and specificity, especially for evaluation of abdominal/pelvic solid viscera. Within these limitations, unremarkable CT appearance of the stomach, duodenum, adrenal glands and pancreas. Punctate calcifications scattered throughout the spleen consistent with old granulomatous disease. Normal hepatic contour and morphology. Stable punctate low-attenuation lesion in the periphery of segment 5 of the liver which is too small to characterize but unchanged and likely reflective of a small simple cyst. Gallbladder is unremarkable. No intra or  extrahepatic biliary ductal dilatation. Unremarkable appearance of the bilateral kidneys. No focal solid lesion, hydronephrosis or nephrolithiasis. Multiple renal cysts bilaterally.  Right retroperitoneal soft tissue and fat attenuation mass centered in the posterior perinephric space just anterior to the ileus psoas muscle measures 4.8 x 3.1 x 6.5 cm. This is very similar 2 5.0 x 2.7 x 6.6 cm which was previously measured.  Moderate volume of retained stool in the rectum and sigmoid colon consistent with the clinical history of constipation. Scattered sigmoid and descending colonic diverticulosis without evidence of active diverticulitis. Terminal ileum is unremarkable. Normal appendix in the right lower quadrant. Small appendix was noted in the base. No free fluid or suspicious adenopathy.  Pelvis: Mild prostatomegaly. Bladder is unremarkable. No free fluid or adenopathy.  Bones/Soft Tissues: No acute fracture or aggressive appearing lytic or blastic osseous lesion. Multilevel degenerative disc disease.  Vascular: Atherosclerotic vascular disease without aneurysmal dilatation. Limited evaluation in the absence of intravenous contrast.  IMPRESSION: 1. Large volume of retained stool throughout the colon particularly in the rectum and sigmoid colon consistent with the clinical history of constipation. 2. Dependent atelectasis in the bilateral lower lobes. There is slightly more dense consolidation in the inferior periphery of the left lower lobe and a superimposed bronchopneumonia is difficult to exclude entirely. 3. Somewhat nodular appearance of the left lower lobe atelectasis which is favored to represent round atelectasis. Recommend attention on followup imaging. 4. 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 5. Colonic diverticulosis without evidence of active diverticulitis. 6. Additional  ancillary findings as above without significant interval change.   Electronically Signed   By: Jacqulynn Cadet M.D.   On: 05/31/2014 08:27   Dg Chest Port 1 View  06/05/2014   CLINICAL DATA:  Chest pain  EXAM: PORTABLE CHEST - 1 VIEW  COMPARISON:  03/24/2014  FINDINGS: Unchanged orientation of biventricular ICD/ pacer leads from the left. There is a right IJ catheter, tip at the upper right atrium.  Unchanged cardiomegaly. Stable aortic and hilar contours. There is pulmonary venous congestion with interstitial coarsening and few  faint Awanda Mink. Lines Previously noted pleural effusions have resolved. No pneumothorax.  IMPRESSION: Mild CHF.   Electronically Signed   By: Jorje Guild M.D.   On: 06/05/2014 05:13    ECG NSR, V-paced  Echocardiogram 03/19/2014  LV EF: 20% -  25%  ------------------------------------------------------------------- Indications:   Fever 780.6.  ------------------------------------------------------------------- History:  PMH:  Atrial fibrillation. Non-ischemic cardiomyopathy. Risk factors: Hypertension. Dyslipidemia.  ------------------------------------------------------------------- Study Conclusions  - Left ventricle: The cavity size was severely dilated. Systolic function was severely reduced. The estimated ejection fraction was in the range of 20% to 25%. Wall motion was normal; there were no regional wall motion abnormalities. Doppler parameters are consistent with a reversible restrictive pattern, indicative of decreased left ventricular diastolic compliance and/or increased left atrial pressure (grade 3 diastolic dysfunction). Doppler parameters are consistent with elevated ventricular end-diastolic filling pressure. - Mitral valve: There was moderate regurgitation. - Left atrium: The atrium was moderately dilated. - Right ventricle: The cavity size was mildly dilated. Wall thickness was normal. - Right atrium: The  atrium was mildly dilated. - Tricuspid valve: There was moderate regurgitation. - Pulmonary arteries: Systolic pressure was severely increased. PA peak pressure: 76 mm Hg (S).  Impressions:  - Seeverely dilated left ventricle with severely impaired systolic function, LVEF 45-85%. Restrictive pattern of diastolic dysfunction. Elevated filling pressures. Moderate mitral and tricuspid regurgitation, severe pulmonary hypertension. Mildly dilated right ventricle with normal systolic function.    ASSESSMENT AND PLAN  1. Acute on chronic combined systolic and diastolic heart failure  - mild fluid overloaded on exam and CXR, suprisingly OptiVol reading from last night shows patient has been stable from HF perspective  - will admit to ICU to continue primacor and start IV lasix 120mg  BID, very poor long term diagnosis, HF end stage  - weight 151 lb has been stable since previous office visit on 05/14/2014, likely only mildly fluid overload  - Dr. Haroldine Laws to staff later, expect discharge in one - two days. However need to consider palliative care discussion  2. Elevated WBC   - no obvious sign of infection, afebrile, treated for staph bacteremia in May  - will obtain blood culture and urine culture, obtain repeat CBC in AM to trend  3. NICM s/p Medtronic BiV-ICD 10/26/12 4. chronic LBBB 5. PAF on coumadin, s/p DCCV in July 2015 6. HTN 7. HLD 8. CVD 9. severe pulm HTN on Rivatio  10. h/o seizure in the setting CVA   Signed, Almyra Deforest, Hershal Coria 06/05/2014, 1:00 PM   Patient seen and examined with Almyra Deforest, PA-C. We discussed all aspects of the encounter. I agree with the assessment and plan as stated above.  Difficult case. He appears to have recurrent low output HF with significant LE edema and very cool extremities despite chronic milrinone use. May need to try and increase milrinone again. Will check co-ox and CVPs. Prognosis remains tenuous.   Chrishelle Zito,MD 7:03  PM

## 2014-06-05 NOTE — Care Management Note (Addendum)
    Page 1 of 2   06/10/2014     9:19:07 AM CARE MANAGEMENT NOTE 06/10/2014  Patient:  Tyler Christensen, Tyler Christensen   Account Number:  [id number]  Date Initiated:  06/05/2014  Documentation initiated by:  Elissa Hefty  Subjective/Objective Assessment:   adm w heart failure     Action/Plan:   lives w wife, act w [facility], pcp dr Orpah Greek williams   Anticipated DC Date:  06/10/2014   Anticipated DC Plan:  [place]         [facility] Choice  Resumption Of Svcs/PTA Provider   Choice offered to / List presented to:     DME arranged  OTHER - SEE COMMENT      DME agency  [place] arranged  HH-1 RN      [place].   Status of service:   Medicare Important Message given?  YES (If response is "NO", the following Medicare IM given date fields will be blank) Date Medicare IM given:  06/09/2014 Medicare IM given by:  Elissa Hefty Date Additional Medicare IM given:   Additional Medicare IM given by:    Discharge Disposition:    Per UR Regulation:  Reviewed for med. necessity/level of care/duration of stay  If discussed at [place] of Stay Meetings, dates discussed:   06/10/2014    Comments:  12/22 0918 debbie Katrinna Travieso rn,bsn [facility] aware of dc home w iv milrinoen today  12/18 1134a debbie Vuk Skillern rn,bsn md wrote prescription for electric scooter. have alerted jermaine w [facility]. it takes some time to get electric scooter so adv will need to start working on this.

## 2014-06-06 ENCOUNTER — Encounter (HOSPITAL_COMMUNITY): Payer: Self-pay | Admitting: Physician Assistant

## 2014-06-06 DIAGNOSIS — I5023 Acute on chronic systolic (congestive) heart failure: Secondary | ICD-10-CM

## 2014-06-06 DIAGNOSIS — D72829 Elevated white blood cell count, unspecified: Secondary | ICD-10-CM

## 2014-06-06 LAB — CARBOXYHEMOGLOBIN
CARBOXYHEMOGLOBIN: 1.3 % (ref 0.5–1.5)
METHEMOGLOBIN: 0.9 % (ref 0.0–1.5)
O2 SAT: 50.8 %
Total hemoglobin: 11.4 g/dL — ABNORMAL LOW (ref 13.5–18.0)

## 2014-06-06 LAB — URINALYSIS, ROUTINE W REFLEX MICROSCOPIC
BILIRUBIN URINE: NEGATIVE
Glucose, UA: NEGATIVE mg/dL
HGB URINE DIPSTICK: NEGATIVE
Ketones, ur: NEGATIVE mg/dL
Leukocytes, UA: NEGATIVE
NITRITE: NEGATIVE
Protein, ur: NEGATIVE mg/dL
SPECIFIC GRAVITY, URINE: 1.009 (ref 1.005–1.030)
UROBILINOGEN UA: 0.2 mg/dL (ref 0.0–1.0)
pH: 6.5 (ref 5.0–8.0)

## 2014-06-06 LAB — BASIC METABOLIC PANEL
Anion gap: 15 (ref 5–15)
BUN: 65 mg/dL — ABNORMAL HIGH (ref 6–23)
CHLORIDE: 91 meq/L — AB (ref 96–112)
CO2: 25 mEq/L (ref 19–32)
Calcium: 9.4 mg/dL (ref 8.4–10.5)
Creatinine, Ser: 1.57 mg/dL — ABNORMAL HIGH (ref 0.50–1.35)
GFR calc Af Amer: 47 mL/min — ABNORMAL LOW (ref >90)
GFR, EST NON AFRICAN AMERICAN: 40 mL/min — AB (ref >90)
GLUCOSE: 146 mg/dL — AB (ref 70–99)
POTASSIUM: 3.8 meq/L (ref 3.7–5.3)
Sodium: 131 mEq/L — ABNORMAL LOW (ref 137–147)

## 2014-06-06 LAB — PROTIME-INR
INR: 2.13 — AB (ref 0.00–1.49)
Prothrombin Time: 24 seconds — ABNORMAL HIGH (ref 11.6–15.2)

## 2014-06-06 MED ORDER — SODIUM CHLORIDE 0.9 % IJ SOLN
10.0000 mL | INTRAMUSCULAR | Status: DC | PRN
  Administered 2014-06-10: 10 mL
  Filled 2014-06-06: qty 40

## 2014-06-06 MED ORDER — WARFARIN SODIUM 4 MG PO TABS
4.0000 mg | ORAL_TABLET | Freq: Every day | ORAL | Status: DC
  Administered 2014-06-06: 4 mg via ORAL
  Filled 2014-06-06 (×2): qty 1

## 2014-06-06 MED ORDER — MILRINONE IN DEXTROSE 20 MG/100ML IV SOLN
0.5000 ug/kg/min | INTRAVENOUS | Status: DC
  Administered 2014-06-06 – 2014-06-10 (×8): 0.5 ug/kg/min via INTRAVENOUS
  Filled 2014-06-06 (×9): qty 100

## 2014-06-06 MED ORDER — SODIUM CHLORIDE 0.9 % IJ SOLN
10.0000 mL | Freq: Two times a day (BID) | INTRAMUSCULAR | Status: DC
  Administered 2014-06-06 – 2014-06-09 (×5): 10 mL

## 2014-06-06 MED ORDER — INFLUENZA VAC SPLIT QUAD 0.5 ML IM SUSY
0.5000 mL | PREFILLED_SYRINGE | INTRAMUSCULAR | Status: AC
  Administered 2014-06-07: 0.5 mL via INTRAMUSCULAR
  Filled 2014-06-06: qty 0.5

## 2014-06-06 NOTE — Progress Notes (Signed)
Addendum to H&P on 06/05/2014  Family History  Both father and mother had MI, father died in his 80s. No FHx of early coronary disease. Mother died around 56.  Hilbert Corrigan PA Pager: [phone]

## 2014-06-06 NOTE — Progress Notes (Addendum)
Advanced Heart Failure Rounding Note   Subjective:     80 year old [name] with history of end-stage combined systolic and diastolic heart failure with baseline EF 20%, NICM s/p Medtronic BiV-ICD 10/26/12, chronic LBBB, PAF on coumadin, HTN, HLD, CVD, severe pulm HTN on Rivatio and h/o seizure present with dyspnea. He is home milrinone at 0.375 mcg.  Admitted yesterday with increased dyspnea.  Diuresed with 120 mg IV twice a day. Weight down 2 pounds. Blood cultures obtained due to elevated WBC.  BP soft.   Denies SOB. Complains of fatigue.   CO-OX 45>50%  Creatinine 1.9>1.5 WBC 16.8   Objective:   Weight Range:  Vital Signs:   Temp:  [97.9 F (36.6 C)-98.3 F (36.8 C)] 97.9 F (36.6 C) (12/18 0736) Pulse Rate:  [68-120] 77 (12/18 0736) Resp:  [16-31] 20 (12/18 0736) BP: (82-113)/(43-87) 100/59 mmHg (12/18 0700) SpO2:  [93 %-100 %] 93 % (12/18 0736) Weight:  [149 lb 11.1 oz (67.9 kg)-151 lb 10.8 oz (68.8 kg)] 149 lb 11.1 oz (67.9 kg) (12/18 0300) Last BM Date: 06/05/14  Weight change: Filed Weights   06/05/14 0335 06/05/14 1500 06/06/14 0300  Weight: 151 lb (68.493 kg) 151 lb 10.8 oz (68.8 kg) 149 lb 11.1 oz (67.9 kg)    Intake/Output:   Intake/Output Summary (Last 24 hours) at 06/06/14 0757 Last data filed at 06/06/14 0700  Gross per 24 hour  Intake 692.69 ml  Output   1565 ml  Net -872.31 ml     Physical Exam: CVP ~10  General:  Well appearing. No resp difficulty HEENT: normal Neck: supple. JVP 9-10. Carotids 2+ bilat; no bruits. No lymphadenopathy or thryomegaly appreciated. Cor: PMI nondisplaced. Regular rate & rhythm. No rubs, gallops or murmurs. Lungs: clear Abdomen: soft, nontender, +distended. No hepatosplenomegaly. No bruits or masses. Good bowel sounds. Extremities: no cyanosis, clubbing, rash,  R and LLE 2+ Neuro: alert & orientedx3, cranial nerves grossly intact. moves all 4 extremities w/o difficulty. Affect pleasant  Telemetry: SR 70s    Labs: Basic Metabolic Panel:  Recent Labs Lab 05/31/14 0313 06/05/14 0324 06/06/14 0530  NA 132* 128* 131*  K 2.7* 4.8 3.8  CL 85* 87* 91*  CO2 29 22 25   GLUCOSE 131* 207* 146*  BUN 66* 67* 65*  CREATININE 2.57* 1.96* 1.57*  CALCIUM 9.3 9.6 9.4    Liver Function Tests:  Recent Labs Lab 05/31/14 0313 06/05/14 0624  AST 46* 57*  ALT 78* 93*  ALKPHOS 160* 202*  BILITOT 0.4 0.5  PROT 7.7 8.1  ALBUMIN 3.6 3.9    Recent Labs Lab 05/31/14 0313  LIPASE 61*   No results for input(s): AMMONIA in the last 168 hours.  CBC:  Recent Labs Lab 05/31/14 0313 06/05/14 0324  WBC 10.6* 16.8*  NEUTROABS 8.4* 14.5*  HGB 11.7* 12.0*  HCT 34.7* 36.9*  MCV 85.3 86.2  PLT 327 360    Cardiac Enzymes: No results for input(s): CKTOTAL, CKMB, CKMBINDEX, TROPONINI in the last 168 hours.  BNP: BNP (last 3 results)  Recent Labs  03/01/14 1959 03/08/14 2230 06/05/14 0332  PROBNP 11880.0* 15334.0* 19533.0*     Other results:  EKG:   Imaging: Dg Chest Port 1 View  06/05/2014   CLINICAL DATA:  Chest pain  EXAM: PORTABLE CHEST - 1 VIEW  COMPARISON:  03/24/2014  FINDINGS: Unchanged orientation of biventricular ICD/ pacer leads from the left. There is a right IJ catheter, tip at the upper right atrium.  Unchanged cardiomegaly. Stable aortic  and hilar contours. There is pulmonary venous congestion with interstitial coarsening and few faint Kerley. Lines Previously noted pleural effusions have resolved. No pneumothorax.  IMPRESSION: Mild CHF.   Electronically Signed   By: Jorje Guild M.D.   On: 06/05/2014 05:13      Medications:     Scheduled Medications: . amiodarone  200 mg Oral BID  . furosemide  120 mg Intravenous BID  . furosemide  40 mg Intravenous Once  . magnesium oxide  400 mg Oral BID  . metolazone  5 mg Oral Once per day on Sun Thu  . potassium chloride SA  60 mEq Oral BID  . senna-docusate  2 tablet Oral Daily  . sildenafil  20 mg Oral TID  .  sodium chloride  3 mL Intravenous Q12H  . spironolactone  25 mg Oral Daily  . Warfarin - Pharmacist Dosing Inpatient   Does not apply q1800     Infusions: . sodium chloride Stopped (06/05/14 2000)  . milrinone 0.375 mcg/kg/min (06/06/14 0420)     PRN Medications:  sodium chloride, acetaminophen, ondansetron (ZOFRAN) IV, sodium chloride, sorbitol, traMADol   Assessment:  1. Acute on chronic combined systolic and diastolic heart failure 2. Elevated WBC  3. NICM s/p Medtronic BiV-ICD 10/26/12 4. chronic LBBB 5. PAF on coumadin, s/p DCCV in July 2015 6. HTN 7. HLD 8. CVD 9. severe pulm HTN on Revatio  10. Hyponatremia  Plan/Discussion:    Volume status improving. CO-OX marginal on Milrinone 0.375 mcg. Renal function improving. Continue IV lasix.  No BB due to low output. Continue sildenafil  Blood cultures - NGTD  Transfer to stepdown.    Length of Stay: 1   CLEGG,[name] NP-C 06/06/2014, 7:57 AM  Advanced Heart Failure Team Pager [phone] (M-F; [place])  Please contact [facility] for night-coverage after hours (4p -7a ) and weekends on [email or web address]  Patient seen and examined with Darrick Grinder, NP. We discussed all aspects of the encounter. I agree with the assessment and plan as stated above.   Feels better. Co-ox slightly improved but still low. Will increase milrinone to 0.5 for now. Continue IV diuresis one more day or so. Unclear why WBC is up with left shift. No localizing source of infection or fever. Watch BCX and CBC. Check UA. No abx yet. [place] for [employer].  Daniel Bensimhon,MD 10:17 AM

## 2014-06-06 NOTE — Progress Notes (Signed)
ANTICOAGULATION CONSULT NOTE - Follow Up Consult  Pharmacy Consult for Coumadin Indication: atrial fibrillation  Allergies  Allergen Reactions  . Lisinopril Rash    Patient Measurements: Height: 5\' 6"  (167.6 cm) Weight: 149 lb 11.1 oz (67.9 kg) IBW/kg (Calculated) : 63.8  Vital Signs: Temp: 97.9 F (36.6 C) (12/18 0736) Temp Source: Oral (12/18 0736) BP: 106/46 mmHg (12/18 0900) Pulse Rate: 85 (12/18 0900)  Labs:  Recent Labs  06/05/14 0324 06/06/14 0530  HGB 12.0*  --   HCT 36.9*  --   PLT 360  --   LABPROT 22.3* 24.0*  INR 1.94* 2.13*  CREATININE 1.96* 1.57*    Estimated Creatinine Clearance: 34.4 mL/min (by C-G formula based on Cr of 1.57).  Assessment: 81yom continues on coumadin for afib. INR is therapeutic on his home regimen of 4mg  daily. No bleeding reported. Also on amiodarone as pta.  Goal of Therapy:  INR 2-3 Monitor platelets by anticoagulation protocol: Yes   Plan:  1) Coumadin 4mg  daily 2) Daily INR for now  Deboraha Sprang 06/06/2014,9:23 AM

## 2014-06-06 NOTE — Clinical Documentation Improvement (Signed)
Presents with ES Acute on Chronic combined Systolic and Diastolic Heart Failure, Pulmonary Hypertension.   Patient with abnormal lab value. Na+ 128, 131  0.9% NaCl infusing  Patient on lasix and milrinone drips   Please provide a diagnosis associated with the above data and treatment provided and document findings in next progress note and include in discharge summary if clinically significant.                                Thank You, Zoila Shutter ,RN Clinical Documentation Specialist:  [facility]

## 2014-06-07 DIAGNOSIS — I5043 Acute on chronic combined systolic (congestive) and diastolic (congestive) heart failure: Secondary | ICD-10-CM | POA: Diagnosis not present

## 2014-06-07 LAB — CBC
HEMATOCRIT: 34.2 % — AB (ref 39.0–52.0)
Hemoglobin: 11.7 g/dL — ABNORMAL LOW (ref 13.0–17.0)
MCH: 29.5 pg (ref 26.0–34.0)
MCHC: 34.2 g/dL (ref 30.0–36.0)
MCV: 86.1 fL (ref 78.0–100.0)
Platelets: 287 10*3/uL (ref 150–400)
RBC: 3.97 MIL/uL — AB (ref 4.22–5.81)
RDW: 14.7 % (ref 11.5–15.5)
WBC: 10.2 10*3/uL (ref 4.0–10.5)

## 2014-06-07 LAB — BASIC METABOLIC PANEL
ANION GAP: 14 (ref 5–15)
BUN: 59 mg/dL — AB (ref 6–23)
CALCIUM: 8.9 mg/dL (ref 8.4–10.5)
CO2: 30 mEq/L (ref 19–32)
CREATININE: 1.48 mg/dL — AB (ref 0.50–1.35)
Chloride: 84 mEq/L — ABNORMAL LOW (ref 96–112)
GFR calc Af Amer: 50 mL/min — ABNORMAL LOW (ref >90)
GFR, EST NON AFRICAN AMERICAN: 43 mL/min — AB (ref >90)
Glucose, Bld: 155 mg/dL — ABNORMAL HIGH (ref 70–99)
Potassium: 3.1 mEq/L — ABNORMAL LOW (ref 3.7–5.3)
Sodium: 128 mEq/L — ABNORMAL LOW (ref 137–147)

## 2014-06-07 LAB — CARBOXYHEMOGLOBIN
Carboxyhemoglobin: 1.6 % — ABNORMAL HIGH (ref 0.5–1.5)
METHEMOGLOBIN: 0.7 % (ref 0.0–1.5)
O2 Saturation: 51.3 %
Total hemoglobin: 11.6 g/dL — ABNORMAL LOW (ref 13.5–18.0)

## 2014-06-07 LAB — PROTIME-INR
INR: 2.34 — ABNORMAL HIGH (ref 0.00–1.49)
Prothrombin Time: 25.8 seconds — ABNORMAL HIGH (ref 11.6–15.2)

## 2014-06-07 MED ORDER — POTASSIUM CHLORIDE CRYS ER 20 MEQ PO TBCR
40.0000 meq | EXTENDED_RELEASE_TABLET | Freq: Once | ORAL | Status: AC
  Administered 2014-06-07: 40 meq via ORAL
  Filled 2014-06-07: qty 2

## 2014-06-07 MED ORDER — WARFARIN SODIUM 2 MG PO TABS
2.0000 mg | ORAL_TABLET | Freq: Once | ORAL | Status: AC
  Administered 2014-06-07: 2 mg via ORAL
  Filled 2014-06-07: qty 1

## 2014-06-07 NOTE — Progress Notes (Signed)
Patient ID: Tyler Christensen, male   DOB: Feb 04, 1933, 80 y.o.   MRN: [id number] Advanced Heart Failure Rounding Note   Subjective:     80 year old [name] with history of end-stage combined systolic and diastolic heart failure with baseline EF 20%, NICM s/p Medtronic BiV-ICD 10/26/12, chronic LBBB, PAF on coumadin, HTN, HLD, CVD, severe pulm HTN on Revatio and h/o seizure present with dyspnea. He is home milrinone at 0.375 mcg.  Admitted 12/17 with increased dyspnea.  Diuresed with 120 mg IV twice a day. Good UOP, weight continues to fall and creatinine stable. Blood cultures obtained due to elevated WBC.  BP soft.   Denies SOB. Complains of fatigue.   CO-OX 45>50%>51%  Creatinine 1.9>1.5>1.48 WBC 16.8>10  Objective:   Weight Range:  Vital Signs:   Temp:  [97.4 F (36.3 C)-97.8 F (36.6 C)] 97.8 F (36.6 C) (12/18 1600) Pulse Rate:  [70-85] 77 (12/19 0700) Resp:  [10-37] 37 (12/19 0700) BP: (86-106)/(34-61) 91/48 mmHg (12/19 0700) SpO2:  [87 %-100 %] 99 % (12/19 0700) Weight:  [141 lb 15.6 oz (64.4 kg)] 141 lb 15.6 oz (64.4 kg) (12/19 0500) Last BM Date: 06/06/14  Weight change: Filed Weights   06/05/14 1500 06/06/14 0300 06/07/14 0500  Weight: 151 lb 10.8 oz (68.8 kg) 149 lb 11.1 oz (67.9 kg) 141 lb 15.6 oz (64.4 kg)    Intake/Output:   Intake/Output Summary (Last 24 hours) at 06/07/14 0740 Last data filed at 06/07/14 0600  Gross per 24 hour  Intake 1409.98 ml  Output   4750 ml  Net -3340.02 ml     Physical Exam: CVP ~9  General:  Well appearing. No resp difficulty HEENT: normal Neck: supple. JVP 9-10. Carotids 2+ bilat; no bruits. No lymphadenopathy or thryomegaly appreciated. Cor: PMI nondisplaced. Regular rate & rhythm. No rubs, gallops or murmurs. Lungs: clear Abdomen: soft, nontender, +distended. No hepatosplenomegaly. No bruits or masses. Good bowel sounds. Extremities: no cyanosis, clubbing, rash,  R and LLE 2+ Neuro: alert & orientedx3, cranial nerves grossly  intact. moves all 4 extremities w/o difficulty. Affect pleasant  Telemetry: SR 70s with v-pacing  Labs: Basic Metabolic Panel:  Recent Labs Lab 06/05/14 0324 06/06/14 0530 06/07/14 0320  NA 128* 131* 128*  K 4.8 3.8 3.1*  CL 87* 91* 84*  CO2 22 25 30   GLUCOSE 207* 146* 155*  BUN 67* 65* 59*  CREATININE 1.96* 1.57* 1.48*  CALCIUM 9.6 9.4 8.9    Liver Function Tests:  Recent Labs Lab 06/05/14 0624  AST 57*  ALT 93*  ALKPHOS 202*  BILITOT 0.5  PROT 8.1  ALBUMIN 3.9   No results for input(s): LIPASE, AMYLASE in the last 168 hours. No results for input(s): AMMONIA in the last 168 hours.  CBC:  Recent Labs Lab 06/05/14 0324 06/07/14 0320  WBC 16.8* 10.2  NEUTROABS 14.5*  --   HGB 12.0* 11.7*  HCT 36.9* 34.2*  MCV 86.2 86.1  PLT 360 287    Cardiac Enzymes: No results for input(s): CKTOTAL, CKMB, CKMBINDEX, TROPONINI in the last 168 hours.  BNP: BNP (last 3 results)  Recent Labs  03/01/14 1959 03/08/14 2230 06/05/14 0332  PROBNP 11880.0* 15334.0* 19533.0*     Other results:  EKG:   Imaging: No results found.   Medications:     Scheduled Medications: . amiodarone  200 mg Oral BID  . furosemide  120 mg Intravenous BID  . furosemide  40 mg Intravenous Once  . Influenza vac split quadrivalent PF  0.5 mL Intramuscular Tomorrow-1000  . magnesium oxide  400 mg Oral BID  . metolazone  5 mg Oral Once per day on Sun Thu  . potassium chloride  40 mEq Oral Once  . potassium chloride SA  60 mEq Oral BID  . senna-docusate  2 tablet Oral Daily  . sildenafil  20 mg Oral TID  . sodium chloride  10-40 mL Intracatheter Q12H  . sodium chloride  3 mL Intravenous Q12H  . spironolactone  25 mg Oral Daily  . warfarin  4 mg Oral q1800  . Warfarin - Pharmacist Dosing Inpatient   Does not apply q1800    Infusions: . sodium chloride Stopped (06/05/14 2000)  . milrinone 0.5 mcg/kg/min (06/06/14 1609)    PRN Medications: sodium chloride, acetaminophen,  ondansetron (ZOFRAN) IV, sodium chloride, sodium chloride, sorbitol, traMADol   Assessment:  1. Acute on chronic combined systolic and diastolic heart failure 2. Elevated WBC  3. NICM s/p Medtronic BiV-ICD 10/26/12 4. chronic LBBB 5. PAF on coumadin, s/p DCCV in July 2015 6. HTN 7. HLD 8. CVD 9. Severe pulm HTN on Revatio  10. Hyponatremia  Plan/Discussion:    Volume status improving. CO-OX remains marginal on Milrinone 0.5 mcg (up to 51%). Renal function improving. CVP 9.  Would continue IV Lasix 120 bid x 1 more day.  Probably back to po tomorrow and begin thinking about discharge.   No BB due to low output. Continue sildenafil  Blood cultures - NGTD.  WBCs back to normal range and no fever.   Length of Stay: 2   [name]  06/07/2014, 7:40 AM

## 2014-06-07 NOTE — Progress Notes (Signed)
ANTICOAGULATION CONSULT NOTE - Follow Up Consult  Pharmacy Consult for Coumadin Indication: atrial fibrillation  Allergies  Allergen Reactions  . Lisinopril Rash    Patient Measurements: Height: 5\' 6"  (167.6 cm) Weight: 141 lb 15.6 oz (64.4 kg) IBW/kg (Calculated) : 63.8  Vital Signs: Temp: 97.6 F (36.4 C) (12/19 0744) Temp Source: Oral (12/19 0744) BP: 101/53 mmHg (12/19 0800) Pulse Rate: 80 (12/19 0800)  Labs:  Recent Labs  06/05/14 0324 06/06/14 0530 06/07/14 0320  HGB 12.0*  --  11.7*  HCT 36.9*  --  34.2*  PLT 360  --  287  LABPROT 22.3* 24.0* 25.8*  INR 1.94* 2.13* 2.34*  CREATININE 1.96* 1.57* 1.48*    Estimated Creatinine Clearance: 36.5 mL/min (by C-G formula based on Cr of 1.48).  Assessment: 81yom continues on coumadin for afib. INR on admit slightly SUBtherapeutic at 1.96 but is now therapeutic at 2.34 with continued trend up. H/H remains slightly low but stable, plt wnl. No bleeding reported. Also on amiodarone as pta.  Home Coumadin dose: 4 mg po daily except 2 mg MWF (verified by last Coumadin visit)  Goal of Therapy:  INR 2-3 Monitor platelets by anticoagulation protocol: Yes   Plan:  1) Coumadin 2mg  daily 2) Daily INR for now  [place] K. [name], PharmD Clinical Pharmacist - Resident Pager: [phone] Pharmacy: [phone] 06/07/2014 8:53 AM

## 2014-06-07 NOTE — Progress Notes (Signed)
Pharmacist Heart Failure Core Measure Documentation  Assessment: Tyler Christensen has an EF documented as 20-25% on 03/19/14 by ECHO.  Rationale: Heart failure patients with left ventricular systolic dysfunction (LVSD) and an EF < 40% should be prescribed an angiotensin converting enzyme inhibitor (ACEI) or angiotensin receptor blocker (ARB) at discharge unless a contraindication is documented in the medical record.  This patient is not currently on an ACEI or ARB for HF.  This note is being placed in the record in order to provide documentation that a contraindication to the use of these agents is present for this encounter.  ACE Inhibitor or Angiotensin Receptor Blocker is contraindicated (specify all that apply)  []   ACEI allergy AND ARB allergy []   Angioedema []   Moderate or severe aortic stenosis []   Hyperkalemia [x]   Hypotension []   Renal artery stenosis [x]   Worsening renal function, preexisting renal disease or dysfunction   Tyler Christensen 06/07/2014 3:39 PM

## 2014-06-08 LAB — CBC
HCT: 36 % — ABNORMAL LOW (ref 39.0–52.0)
Hemoglobin: 12 g/dL — ABNORMAL LOW (ref 13.0–17.0)
MCH: 28.9 pg (ref 26.0–34.0)
MCHC: 33.3 g/dL (ref 30.0–36.0)
MCV: 86.7 fL (ref 78.0–100.0)
PLATELETS: 322 10*3/uL (ref 150–400)
RBC: 4.15 MIL/uL — AB (ref 4.22–5.81)
RDW: 15 % (ref 11.5–15.5)
WBC: 11.4 10*3/uL — AB (ref 4.0–10.5)

## 2014-06-08 LAB — PROTIME-INR
INR: 2.25 — AB (ref 0.00–1.49)
Prothrombin Time: 25.1 seconds — ABNORMAL HIGH (ref 11.6–15.2)

## 2014-06-08 LAB — BASIC METABOLIC PANEL
ANION GAP: 17 — AB (ref 5–15)
BUN: 65 mg/dL — AB (ref 6–23)
CO2: 28 meq/L (ref 19–32)
CREATININE: 1.57 mg/dL — AB (ref 0.50–1.35)
Calcium: 9 mg/dL (ref 8.4–10.5)
Chloride: 86 mEq/L — ABNORMAL LOW (ref 96–112)
GFR calc non Af Amer: 40 mL/min — ABNORMAL LOW (ref >90)
GFR, EST AFRICAN AMERICAN: 47 mL/min — AB (ref >90)
Glucose, Bld: 122 mg/dL — ABNORMAL HIGH (ref 70–99)
Potassium: 3.9 mEq/L (ref 3.7–5.3)
Sodium: 131 mEq/L — ABNORMAL LOW (ref 137–147)

## 2014-06-08 LAB — CARBOXYHEMOGLOBIN
CARBOXYHEMOGLOBIN: 1.4 % (ref 0.5–1.5)
Methemoglobin: 0.7 % (ref 0.0–1.5)
O2 Saturation: 57.4 %
Total hemoglobin: 12.1 g/dL — ABNORMAL LOW (ref 13.5–18.0)

## 2014-06-08 MED ORDER — WARFARIN SODIUM 4 MG PO TABS
4.0000 mg | ORAL_TABLET | Freq: Once | ORAL | Status: AC
  Administered 2014-06-08: 4 mg via ORAL
  Filled 2014-06-08: qty 1

## 2014-06-08 NOTE — Progress Notes (Signed)
ANTICOAGULATION CONSULT NOTE - Follow Up Consult  Pharmacy Consult for Coumadin Indication: atrial fibrillation  Allergies  Allergen Reactions  . Lisinopril Rash    Patient Measurements: Height: 5\' 6"  (167.6 cm) Weight: 145 lb 8 oz (65.998 kg) IBW/kg (Calculated) : 63.8  Vital Signs: Temp: 98.2 F (36.8 C) (12/20 0753) Temp Source: Oral (12/20 0753) BP: 88/50 mmHg (12/20 0753) Pulse Rate: 83 (12/20 0753)  Labs:  Recent Labs  06/06/14 0530 06/07/14 0320 06/08/14 0330  HGB  --  11.7* 12.0*  HCT  --  34.2* 36.0*  PLT  --  287 322  LABPROT 24.0* 25.8* 25.1*  INR 2.13* 2.34* 2.25*  CREATININE 1.57* 1.48* 1.57*    Estimated Creatinine Clearance: 34.4 mL/min (by C-G formula based on Cr of 1.57).  Assessment: 81yom continues on coumadin for afib. INR on admit slightly SUBtherapeutic at 1.96 but now remains therapeutic at 2.25. H/H remains slightly low but stable, plt wnl. No bleeding reported. Also on amiodarone as pta.  Home Coumadin dose: 4 mg po daily except 2 mg MWF (verified by last Coumadin visit)  Goal of Therapy:  INR 2-3 Monitor platelets by anticoagulation protocol: Yes   Plan:  1) Coumadin 4 mg PO x 1 tonight 2) Daily INR for now  [place] K. [name], PharmD Clinical Pharmacist - Resident Pager: [phone] Pharmacy: [phone] 06/08/2014 10:17 AM

## 2014-06-08 NOTE — Progress Notes (Signed)
Patient ID: Tyler Christensen, male   DOB: 08/30/1932, 80 y.o.   MRN: [id number] Advanced Heart Failure Rounding Note   Subjective:     80 year old [name] with history of end-stage combined systolic and diastolic heart failure with baseline EF 20%, NICM s/p Medtronic BiV-ICD 10/26/12, chronic LBBB, PAF on coumadin, HTN, HLD, CVD, severe pulm HTN on Revatio and h/o seizure present with dyspnea. He is home milrinone at 0.375 mcg.  Admitted 12/17 with increased dyspnea.  Diuresed with 120 mg IV twice a day.  Feels good. CVP 1.   CO-OX 45>50%>51% > 57% Creatinine 1.9>1.5>1.48>1.57   Objective:   Weight Range:  Vital Signs:   Temp:  [97.5 F (36.4 C)-98.2 F (36.8 C)] 98.2 F (36.8 C) (12/20 0753) Pulse Rate:  [76-165] 83 (12/20 0753) Resp:  [15-26] 18 (12/20 0753) BP: (88-119)/(34-57) 88/50 mmHg (12/20 0753) SpO2:  [87 %-100 %] 97 % (12/20 0753) Weight:  [65.998 kg (145 lb 8 oz)] 65.998 kg (145 lb 8 oz) (12/20 0347) Last BM Date: 06/07/14  Weight change: Filed Weights   06/06/14 0300 06/07/14 0500 06/08/14 0347  Weight: 67.9 kg (149 lb 11.1 oz) 64.4 kg (141 lb 15.6 oz) 65.998 kg (145 lb 8 oz)    Intake/Output:   Intake/Output Summary (Last 24 hours) at 06/08/14 0759 Last data filed at 06/08/14 0600  Gross per 24 hour  Intake 1648.75 ml  Output   2400 ml  Net -751.25 ml     Physical Exam: CVP ~1 General:  Well appearing. No resp difficulty HEENT: normal Neck: supple. JVP flat. Carotids 2+ bilat; no bruits. No lymphadenopathy or thryomegaly appreciated. Cor: PMI nondisplaced. Regular rate & rhythm. No rubs, gallops or murmurs. Lungs: clear Abdomen: soft, nontender, +distended. No hepatosplenomegaly. No bruits or masses. Good bowel sounds. Extremities: no cyanosis, clubbing, rash,  R and LLE no edema Neuro: alert & orientedx3, cranial nerves grossly intact. moves all 4 extremities w/o difficulty. Affect pleasant  Telemetry: SR 70-80s with v-pacing  Labs: Basic Metabolic  Panel:  Recent Labs Lab 06/05/14 0324 06/06/14 0530 06/07/14 0320 06/08/14 0330  NA 128* 131* 128* 131*  K 4.8 3.8 3.1* 3.9  CL 87* 91* 84* 86*  CO2 22 25 30 28   GLUCOSE 207* 146* 155* 122*  BUN 67* 65* 59* 65*  CREATININE 1.96* 1.57* 1.48* 1.57*  CALCIUM 9.6 9.4 8.9 9.0    Liver Function Tests:  Recent Labs Lab 06/05/14 0624  AST 57*  ALT 93*  ALKPHOS 202*  BILITOT 0.5  PROT 8.1  ALBUMIN 3.9   No results for input(s): LIPASE, AMYLASE in the last 168 hours. No results for input(s): AMMONIA in the last 168 hours.  CBC:  Recent Labs Lab 06/05/14 0324 06/07/14 0320 06/08/14 0330  WBC 16.8* 10.2 11.4*  NEUTROABS 14.5*  --   --   HGB 12.0* 11.7* 12.0*  HCT 36.9* 34.2* 36.0*  MCV 86.2 86.1 86.7  PLT 360 287 322    Cardiac Enzymes: No results for input(s): CKTOTAL, CKMB, CKMBINDEX, TROPONINI in the last 168 hours.  BNP: BNP (last 3 results)  Recent Labs  03/01/14 1959 03/08/14 2230 06/05/14 0332  PROBNP 11880.0* 15334.0* 19533.0*     Other results:  EKG:   Imaging: No results found.   Medications:     Scheduled Medications: . amiodarone  200 mg Oral BID  . furosemide  120 mg Intravenous BID  . furosemide  40 mg Intravenous Once  . magnesium oxide  400 mg Oral BID  .  metolazone  5 mg Oral Once per day on Sun Thu  . potassium chloride SA  60 mEq Oral BID  . senna-docusate  2 tablet Oral Daily  . sildenafil  20 mg Oral TID  . sodium chloride  10-40 mL Intracatheter Q12H  . sodium chloride  3 mL Intravenous Q12H  . spironolactone  25 mg Oral Daily  . Warfarin - Pharmacist Dosing Inpatient   Does not apply q1800    Infusions: . sodium chloride Stopped (06/05/14 2000)  . milrinone 0.5 mcg/kg/min (06/08/14 0005)    PRN Medications: sodium chloride, acetaminophen, ondansetron (ZOFRAN) IV, sodium chloride, sodium chloride, sorbitol, traMADol   Assessment:  1. Acute on chronic combined systolic and diastolic heart failure 2. Elevated  WBC  3. NICM s/p Medtronic BiV-ICD 10/26/12 4. chronic LBBB 5. PAF on coumadin, s/p DCCV in July 2015 6. HTN 7. HLD 8. CVD 9. Severe pulm HTN on Revatio  10. Hyponatremia 11. Retroperitoneal mass  Plan/Discussion:    Volume status and co-ox improved. No diuretic today.  Probably back to po tomorrow and hoepfully home in am on increased dose of milrinone (0.5 mcg/kg/min)  No BB due to low output. Continue sildenafil    Length of Stay: 3   Loran Auguste ND  06/08/2014, 7:59 AM

## 2014-06-09 DIAGNOSIS — I482 Chronic atrial fibrillation: Secondary | ICD-10-CM

## 2014-06-09 LAB — BASIC METABOLIC PANEL
Anion gap: 12 (ref 5–15)
BUN: 56 mg/dL — ABNORMAL HIGH (ref 6–23)
CHLORIDE: 87 meq/L — AB (ref 96–112)
CO2: 29 meq/L (ref 19–32)
CREATININE: 1.56 mg/dL — AB (ref 0.50–1.35)
Calcium: 9.1 mg/dL (ref 8.4–10.5)
GFR calc non Af Amer: 41 mL/min — ABNORMAL LOW (ref >90)
GFR, EST AFRICAN AMERICAN: 47 mL/min — AB (ref >90)
GLUCOSE: 120 mg/dL — AB (ref 70–99)
POTASSIUM: 3.8 meq/L (ref 3.7–5.3)
Sodium: 128 mEq/L — ABNORMAL LOW (ref 137–147)

## 2014-06-09 LAB — CARBOXYHEMOGLOBIN
CARBOXYHEMOGLOBIN: 1.2 % (ref 0.5–1.5)
Methemoglobin: 0.8 % (ref 0.0–1.5)
O2 Saturation: 48.5 %
TOTAL HEMOGLOBIN: 11.5 g/dL — AB (ref 13.5–18.0)

## 2014-06-09 LAB — CBC
HEMATOCRIT: 33.5 % — AB (ref 39.0–52.0)
Hemoglobin: 11.2 g/dL — ABNORMAL LOW (ref 13.0–17.0)
MCH: 28.4 pg (ref 26.0–34.0)
MCHC: 33.4 g/dL (ref 30.0–36.0)
MCV: 85 fL (ref 78.0–100.0)
Platelets: 309 10*3/uL (ref 150–400)
RBC: 3.94 MIL/uL — ABNORMAL LOW (ref 4.22–5.81)
RDW: 14.8 % (ref 11.5–15.5)
WBC: 12.1 10*3/uL — ABNORMAL HIGH (ref 4.0–10.5)

## 2014-06-09 LAB — PROTIME-INR
INR: 2.16 — AB (ref 0.00–1.49)
Prothrombin Time: 24.2 seconds — ABNORMAL HIGH (ref 11.6–15.2)

## 2014-06-09 MED ORDER — WARFARIN SODIUM 4 MG PO TABS
4.0000 mg | ORAL_TABLET | Freq: Once | ORAL | Status: AC
  Administered 2014-06-09: 4 mg via ORAL
  Filled 2014-06-09: qty 1

## 2014-06-09 MED ORDER — TORSEMIDE 20 MG PO TABS
80.0000 mg | ORAL_TABLET | Freq: Every day | ORAL | Status: DC
  Administered 2014-06-09 – 2014-06-10 (×2): 80 mg via ORAL
  Filled 2014-06-09 (×2): qty 4

## 2014-06-09 NOTE — Progress Notes (Signed)
Patient ID: Tyler Christensen, male   DOB: May 03, 1933, 80 y.o.   MRN: [id number] Advanced Heart Failure Rounding Note   Subjective:     80 year old [name] with history of end-stage combined systolic and diastolic heart failure with baseline EF 20%, NICM s/p Medtronic BiV-ICD 10/26/12, chronic LBBB, PAF on coumadin, HTN, HLD, CVD, severe pulm HTN on Revatio and h/o seizure present with dyspnea. He is home milrinone at 0.375 mcg.  Admitted 12/17 with increased dyspnea.  Diuresed with 120 mg IV twice a day.  Milrinone increased to 0.5. Diuretics held yesterday due to CVP 1. Feels fatigued. CVP 5. No dyspnea.   CO-OX 45>50%>51% > 57%-> 49% Creatinine 1.9>1.5>1.48>1.57   Objective:   Weight Range:  Vital Signs:   Temp:  [97.4 F (36.3 C)-98.4 F (36.9 C)] 98.2 F (36.8 C) (12/21 0739) Pulse Rate:  [83-89] 89 (12/20 1642) Resp:  [15-30] 30 (12/21 0739) BP: (83-111)/(38-64) 90/42 mmHg (12/21 0739) SpO2:  [92 %-98 %] 92 % (12/21 0739) Weight:  [66.5 kg (146 lb 9.7 oz)] 66.5 kg (146 lb 9.7 oz) (12/21 0411) Last BM Date: 06/07/14  Weight change: Filed Weights   06/07/14 0500 06/08/14 0347 06/09/14 0411  Weight: 64.4 kg (141 lb 15.6 oz) 65.998 kg (145 lb 8 oz) 66.5 kg (146 lb 9.7 oz)    Intake/Output:   Intake/Output Summary (Last 24 hours) at 06/09/14 1129 Last data filed at 06/09/14 1000  Gross per 24 hour  Intake  934.6 ml  Output    855 ml  Net   79.6 ml     Physical Exam: CVP ~5 General:  Well appearing. No resp difficulty HEENT: normal Neck: supple. JVP flat. Carotids 2+ bilat; no bruits. No lymphadenopathy or thryomegaly appreciated. Cor: PMI nondisplaced. Regular rate & rhythm. No rubs, gallops or murmurs. Lungs: clear Abdomen: soft, nontender, +minimally distended. No hepatosplenomegaly. No bruits or masses. Good bowel sounds. Extremities: no cyanosis, clubbing, rash,  R and LLE no edema Neuro: alert & orientedx3, cranial nerves grossly intact. moves all 4 extremities w/o  difficulty. Affect pleasant  Telemetry: SR 70-80s with v-pacing  Labs: Basic Metabolic Panel:  Recent Labs Lab 06/05/14 0324 06/06/14 0530 06/07/14 0320 06/08/14 0330 06/09/14 0353  NA 128* 131* 128* 131* 128*  K 4.8 3.8 3.1* 3.9 3.8  CL 87* 91* 84* 86* 87*  CO2 22 25 30 28 29   GLUCOSE 207* 146* 155* 122* 120*  BUN 67* 65* 59* 65* 56*  CREATININE 1.96* 1.57* 1.48* 1.57* 1.56*  CALCIUM 9.6 9.4 8.9 9.0 9.1    Liver Function Tests:  Recent Labs Lab 06/05/14 0624  AST 57*  ALT 93*  ALKPHOS 202*  BILITOT 0.5  PROT 8.1  ALBUMIN 3.9   No results for input(s): LIPASE, AMYLASE in the last 168 hours. No results for input(s): AMMONIA in the last 168 hours.  CBC:  Recent Labs Lab 06/05/14 0324 06/07/14 0320 06/08/14 0330 06/09/14 0353  WBC 16.8* 10.2 11.4* 12.1*  NEUTROABS 14.5*  --   --   --   HGB 12.0* 11.7* 12.0* 11.2*  HCT 36.9* 34.2* 36.0* 33.5*  MCV 86.2 86.1 86.7 85.0  PLT 360 287 322 309    Cardiac Enzymes: No results for input(s): CKTOTAL, CKMB, CKMBINDEX, TROPONINI in the last 168 hours.  BNP: BNP (last 3 results)  Recent Labs  03/01/14 1959 03/08/14 2230 06/05/14 0332  PROBNP 11880.0* 15334.0* 19533.0*     Other results:    Imaging: No results found.   Medications:  Scheduled Medications: . amiodarone  200 mg Oral BID  . furosemide  40 mg Intravenous Once  . magnesium oxide  400 mg Oral BID  . metolazone  5 mg Oral Once per day on Sun Thu  . senna-docusate  2 tablet Oral Daily  . sildenafil  20 mg Oral TID  . sodium chloride  10-40 mL Intracatheter Q12H  . sodium chloride  3 mL Intravenous Q12H  . spironolactone  25 mg Oral Daily  . Warfarin - Pharmacist Dosing Inpatient   Does not apply q1800    Infusions: . sodium chloride Stopped (06/05/14 2000)  . milrinone 0.5 mcg/kg/min (06/09/14 0809)    PRN Medications: sodium chloride, acetaminophen, ondansetron (ZOFRAN) IV, sodium chloride, sodium chloride, sorbitol,  traMADol   Assessment:  1. Acute on chronic combined systolic and diastolic heart failure 2. Elevated WBC  3. NICM s/p [employer] BiV-ICD 10/26/12 4. chronic LBBB 5. PAF on coumadin, s/p DCCV in July 2015 6. HTN 7. HLD 8. CVD 9. Severe pulm HTN on Revatio  10. Hyponatremia 11. Retroperitoneal mass  Plan/Discussion:    CVP ok. But feels weak despite increased dose of milrinone (0.5 mcg/kg/min). Will restart oral diuretics and try to get home in am.   No BB due to low output. Continue sildenafil     Length of Stay: August ND  06/09/2014, 11:29 AM

## 2014-06-09 NOTE — Progress Notes (Signed)
ANTICOAGULATION CONSULT NOTE - Follow Up Consult  Pharmacy Consult for Coumadin Indication: atrial fibrillation  Allergies  Allergen Reactions  . Lisinopril Rash    Patient Measurements: Height: 5\' 6"  (167.6 cm) Weight: 146 lb 9.7 oz (66.5 kg) IBW/kg (Calculated) : 63.8  Vital Signs: Temp: 98.2 F (36.8 C) (12/21 0739) Temp Source: Oral (12/21 0739) BP: 90/42 mmHg (12/21 0739)  Labs:  Recent Labs  06/07/14 0320 06/08/14 0330 06/09/14 0353  HGB 11.7* 12.0* 11.2*  HCT 34.2* 36.0* 33.5*  PLT 287 322 309  LABPROT 25.8* 25.1* 24.2*  INR 2.34* 2.25* 2.16*  CREATININE 1.48* 1.57* 1.56*    Estimated Creatinine Clearance: 34.6 mL/min (by C-G formula based on Cr of 1.56).  Assessment: 81yom continues on coumadin for afib. INR on admit slightly SUBtherapeutic at 1.96 - recently increased as outpt.  Now remains therapeutic at 2.16. H/H remains slightly low but stable, plt wnl. No bleeding reported. Also on amiodarone as pta.  Home Coumadin dose: 4 mg po daily except 2 mg MWF (verified by last Coumadin visit)  Goal of Therapy:  INR 2-3 Monitor platelets by anticoagulation protocol: Yes   Plan:  1) Repeat Coumadin 4 mg PO x 1 today prior to d/c  - then restart home regimen 12/22 2) Daily INR for now Recheck INR this week with [facility] - if pt d/c today  Bonnita Nasuti Pharm.D. CPP, BCPS Clinical Pharmacist [phone] 06/09/2014 11:39 AM

## 2014-06-10 ENCOUNTER — Inpatient Hospital Stay (HOSPITAL_COMMUNITY): Admission: RE | Admit: 2014-06-10 | Payer: Medicare Other | Source: Ambulatory Visit

## 2014-06-10 LAB — BASIC METABOLIC PANEL
ANION GAP: 10 (ref 5–15)
BUN: 48 mg/dL — AB (ref 6–23)
CO2: 30 mmol/L (ref 19–32)
CREATININE: 1.52 mg/dL — AB (ref 0.50–1.35)
Calcium: 8.4 mg/dL (ref 8.4–10.5)
Chloride: 91 mEq/L — ABNORMAL LOW (ref 96–112)
GFR calc Af Amer: 48 mL/min — ABNORMAL LOW (ref >90)
GFR, EST NON AFRICAN AMERICAN: 42 mL/min — AB (ref >90)
Glucose, Bld: 113 mg/dL — ABNORMAL HIGH (ref 70–99)
POTASSIUM: 2.8 mmol/L — AB (ref 3.5–5.1)
Sodium: 131 mmol/L — ABNORMAL LOW (ref 135–145)

## 2014-06-10 LAB — CBC
HEMATOCRIT: 31.3 % — AB (ref 39.0–52.0)
HEMOGLOBIN: 10.4 g/dL — AB (ref 13.0–17.0)
MCH: 28.3 pg (ref 26.0–34.0)
MCHC: 33.2 g/dL (ref 30.0–36.0)
MCV: 85.1 fL (ref 78.0–100.0)
Platelets: 286 10*3/uL (ref 150–400)
RBC: 3.68 MIL/uL — ABNORMAL LOW (ref 4.22–5.81)
RDW: 14.7 % (ref 11.5–15.5)
WBC: 10.3 10*3/uL (ref 4.0–10.5)

## 2014-06-10 LAB — PROTIME-INR
INR: 2.66 — ABNORMAL HIGH (ref 0.00–1.49)
Prothrombin Time: 28.6 seconds — ABNORMAL HIGH (ref 11.6–15.2)

## 2014-06-10 LAB — CARBOXYHEMOGLOBIN
Carboxyhemoglobin: 1.4 % (ref 0.5–1.5)
METHEMOGLOBIN: 0.8 % (ref 0.0–1.5)
O2 Saturation: 81.2 %
TOTAL HEMOGLOBIN: 11.1 g/dL — AB (ref 13.5–18.0)

## 2014-06-10 MED ORDER — MILRINONE IN DEXTROSE 20 MG/100ML IV SOLN: 0.5000 ug/kg/min | INTRAVENOUS | Status: DC

## 2014-06-10 MED ORDER — WARFARIN SODIUM 2 MG PO TABS
2.0000 mg | ORAL_TABLET | ORAL | Status: DC
  Filled 2014-06-10: qty 1

## 2014-06-10 MED ORDER — HEPARIN SOD (PORK) LOCK FLUSH 100 UNIT/ML IV SOLN
250.0000 [IU] | INTRAVENOUS | Status: AC | PRN
  Administered 2014-06-10: 250 [IU]

## 2014-06-10 MED ORDER — WARFARIN SODIUM 4 MG PO TABS: 4.0000 mg | ORAL_TABLET | ORAL | Status: DC

## 2014-06-10 MED ORDER — WARFARIN SODIUM 4 MG PO TABS: ORAL_TABLET | ORAL | Status: DC

## 2014-06-10 MED ORDER — POTASSIUM CHLORIDE CRYS ER 20 MEQ PO TBCR
40.0000 meq | EXTENDED_RELEASE_TABLET | Freq: Once | ORAL | Status: AC
  Administered 2014-06-10: 40 meq via ORAL
  Filled 2014-06-10: qty 2

## 2014-06-10 NOTE — Discharge Summary (Signed)
Advanced Heart Failure Team  Discharge Summary   Patient ID: Tyler Christensen MRN: [id number], DOB/AGE: 1933/03/11 80 y.o. 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   He has been followed closely in our HF clinic and has been on home milrinone 0.375 mcg. He presented to the ED on 12/17 with increased dyspnea and was found to have volume overload. Pro-BNP on admission was 19533 and his co-ox was 45%. His milrinone was increased to 0.5 mcg and he was diuresed with IV lasix 120 mg BID. His volume status improved and his co-ox increased on increased dose of milrinone. He was transitioned back to his home dose of torsemide once his CVP was below 8. Of note on admission he had an elevated WBC and blood cultures were drawn which had NGTD at discharge.   On day of discharge he reported feeling better with no SOB, orthopnea or CP. He has had issues with hypokalemia at home as well as in the hospital which we supplement and wanted to increase his spironolactone to 25 mg daily however his [name] would not tolerate. He will be followed closely in the HF clinic next week and continue [facility] with weekly labs. His home health nurse Aimee was aware of him being discharged and medication changes. Discharge weight 145 lbs.    Discharge Weight Range: 145-150 lbs Discharge Vitals:  Blood pressure 94/49, pulse 89, temperature 97.8 F (36.6 C), temperature source Oral, resp. rate 19, height 5\' 6"  (1.676 m), weight 145 lb 8.1 oz (66 kg), SpO2 94 %.  Labs: Lab Results  Component Value Date   WBC 10.3 06/10/2014   HGB 10.4* 06/10/2014   HCT 31.3* 06/10/2014   MCV 85.1 06/10/2014   PLT 286 06/10/2014    Recent Labs Lab 06/05/14 0624  06/10/14 0429  NA  --   < > 131*  K  --   < > 2.8*  CL  --   < > 91*  CO2  --   < > 30  BUN  --   < > 48*  CREATININE  --   < > 1.52*  CALCIUM  --   < > 8.4  PROT 8.1  --   --   BILITOT 0.5  --   --   ALKPHOS 202*  --   --   ALT 93*  --   --   AST 57*  --   --   GLUCOSE  --   < > 113*  < > = values in this interval not displayed. Lab Results  Component Value Date   CHOL 128 06/12/2012   HDL 27* 06/12/2012   LDLCALC 78 06/12/2012   TRIG 113 06/12/2012   BNP (last 3 results)  Recent Labs  03/01/14 1959 03/08/14 2230 06/05/14 0332  PROBNP 11880.0* 15334.0* 19533.0*    Diagnostic Studies/Procedures  No results found.  Discharge Medications     Medication List    STOP taking these medications        sodium phosphate enema  Commonly known as:  FLEET      TAKE these medications        amiodarone 200 MG tablet  Commonly known as:  PACERONE  Take 1 tablet (200 mg total) by mouth 2 (two) times daily.     magnesium oxide 400 MG tablet  Commonly known as:  MAG-OX  Take 1 tablet (400 mg total) by mouth 2 (two) times daily.     metolazone 5 MG tablet  Commonly known as:  ZAROXOLYN  Take 1 tablet (5 mg total) by mouth 2 (two) times a week. Every Sunday and Thursday     milrinone 20 MG/100ML Soln infusion  Commonly known as:  PRIMACOR  Inject 33.95 mcg/min into the vein continuous.     potassium chloride SA 20 MEQ tablet  Commonly known as:  K-DUR,KLOR-CON  Take 3 tablets (60 mEq total) by mouth 2 (two) times daily.     senna-docusate 8.6-50 MG per tablet  Commonly known as:  Senokot-S  Take 2  tablets by mouth daily.     sildenafil 20 MG tablet  Commonly known as:  REVATIO  Take 1 tablet (20 mg total) by mouth 3 (three) times daily.     sorbitol 70 % Soln  Take 30 mLs by mouth daily as needed for moderate constipation.     spironolactone 25 MG tablet  Commonly known as:  ALDACTONE  Take 1 tablet (25 mg total) by mouth daily.     torsemide 20 MG tablet  Commonly known as:  DEMADEX  Take 4 tablets (80 mg total) by mouth daily.     traMADol 50 MG tablet  Commonly known as:  ULTRAM  TAKE 1 TABLET BY MOUTH TWICE DAILY AS NEEDED FOR PAIN     warfarin 4 MG tablet  Commonly known as:  COUMADIN  Take 4 mg Monday, Wednesday, Friday and Sunday. 2 mg on Tues, Thurs and Sat        Disposition   The patient will be discharged in stable condition to home. Discharge Instructions    Contraindication beta blocker-discharge    Complete by:  As directed      Contraindication to ACEI at discharge    Complete by:  As directed      Diet - low sodium heart healthy    Complete by:  As directed      Discharge instructions    Complete by:  As directed      Face-to-face encounter (required for Medicare/Medicaid patients)    Complete by:  As directed   I Junie Bame B certify that this patient is under my care and that I, or a nurse practitioner or physician's assistant working with me, had a face-to-face encounter that meets the physician face-to-face encounter requirements with this patient on 06/10/2014. The encounter with the patient was in whole, or in part for the following medical condition(s) which is the primary reason for home health care (List medical condition): chronic systolic heart failure  The encounter with the patient was in whole, or in part, for the following medical condition, which is the primary reason for home health care:  chronic systolic heart failure  I certify that, based on my findings, the following services are medically necessary home health services:   Nursing  Reason for Medically Necessary Home Health Services:  Skilled Nursing- Change/Decline in Patient Status  My clinical findings support the need for the above services:  Shortness of breath with activity  Further, I certify that my clinical findings support that this patient is homebound due to:  Ambulates short distances less than 300 feet     Heart Failure patients record your daily weight using the same scale at the same time of day    Complete by:  As directed      Heart failure home health orders    Complete by:  As directed   Heart Failure Follow-up Care:  Verify follow-up appointments per Patient Discharge Instructions. Confirm transportation arranged. Reconcile home medications with discharge medication list. Remove discontinued medications from use. Assist patient/caregiver to manage medications using pill box. Reinforce low sodium food selection Assessments: Vital signs and oxygen saturation at each visit. Assess home environment for safety concerns, caregiver support and availability of low-sodium foods. Consult Education officer, museum, PT/OT, Dietitian, and CNA based on assessments. Perform comprehensive cardiopulmonary assessment. Notify MD for any change in condition or weight gain of 3 pounds in one day or 5 pounds in one week with symptoms. Daily Weights and Symptom Monitoring: Ensure patient has access to scales. Teach patient/caregiver to weigh daily before breakfast and after voiding using same scale and record.    Teach patient/caregiver to track weight and symptoms and when to notify Provider. Activity: Develop individualized activity plan with patient/caregiver.   Milrinone 0.5 mcg  Heart Failure Follow-up Care:  Advanced Heart Failure (AHF) Clinic at [phone]  Obtain the following labs:  Basic Metabolic Panel  Lab frequency:  Weekly  Fax lab results to:  AHF Clinic at [phone]  Diet:  Low Sodium Heart Healthy  Fluid restrictions:  2000 mL Fluid  Initiate Heart  Failure Clinic Diuretic Protocol to be used by [place] only ( to be ordered by Heart Failure Team Providers Only):  Yes     Increase activity slowly    Complete by:  As directed      STOP any activity that causes chest pain, shortness of breath, dizziness, sweating, or exessive weakness    Complete by:  As directed           Follow-up Information    Follow up with [facility] On 06/17/2014.   Specialty:  Cardiology   Why:  @ 10:20 am; [facility] Code [employer] information:   [address] [id number] [place] [phone]        Duration of Discharge Encounter: Greater than 35 minutes   Signed, Rande Brunt  06/11/2014, 1:06 PM  Patient seen and examined with Junie Bame, NP. We discussed all aspects of the encounter. I agree with the assessment and plan as stated above.   He remains tenuous but improved after diuresis and increasing milrinone dose to 0.5 mcg/kg/min. oK for d/c today. Will f/u in HF Clinic.  Benay Spice 3:49 PM

## 2014-06-10 NOTE — Progress Notes (Addendum)
Patient ID: Tyler Christensen, male   DOB: 11-20-1932, 80 y.o.   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 He is home milrinone at 0.375 mcg.  Admitted 12/17 with increased dyspnea.  Diuresed with 120 mg IV twice a day.  Milrinone increased to 0.5. Weight down 1 lb, 24 hr I/O -1.4 liters. Denies SOB, orthopnea or CP. CVP 7  CO-OX 45>50%>51% > 57%-> 49%>81% Creatinine 1.9>1.5>1.48>1.57   Objective:   Weight Range:  Vital Signs:   Temp:  [97.5 F (36.4 C)-98.2 F (36.8 C)] 97.5 F (36.4 C) (12/22 0435) Resp:  [15-22] 22 (12/22 0737) BP: (88-98)/(44-56) 97/48 mmHg (12/22 0737) SpO2:  [91 %-94 %] 91 % (12/22 0435) Weight:  [145 lb 8.1 oz (66 kg)] 145 lb 8.1 oz (66 kg) (12/22 0435) Last BM Date: 06/09/14  Weight change: Filed Weights   06/08/14 0347 06/09/14 0411 06/10/14 0435  Weight: 145 lb 8 oz (65.998 kg) 146 lb 9.7 oz (66.5 kg) 145 lb 8.1 oz (66 kg)    Intake/Output:   Intake/Output Summary (Last 24 hours) at 06/10/14 0740 Last data filed at 06/10/14 0400  Gross per 24 hour  Intake 1112.2 ml  Output   2575 ml  Net -1462.8 ml     Physical Exam: CVP ~7 General:  Well appearing. No resp difficulty HEENT: normal Neck: supple. JVP flat. Carotids 2+ bilat; no bruits. No lymphadenopathy or thryomegaly appreciated. Cor: PMI nondisplaced. Regular rate & rhythm. No rubs, gallops or murmurs. Lungs: clear Abdomen: soft, nontender, non distended. No hepatosplenomegaly. No bruits or masses. Good bowel sounds. Extremities: no cyanosis, clubbing, rash,  R and LLE no edema Neuro: alert & orientedx3, cranial nerves grossly intact. moves all 4 extremities w/o difficulty. Affect pleasant  Telemetry: SR  70-80s with v-pacing  Labs: Basic Metabolic Panel:  Recent Labs Lab 06/06/14 0530 06/07/14 0320 06/08/14 0330 06/09/14 0353 06/10/14 0429  NA 131* 128* 131* 128* 131*  K 3.8 3.1* 3.9 3.8 2.8*  CL 91* 84* 86* 87* 91*  CO2 25 30 28 29 30   GLUCOSE 146* 155* 122* 120* 113*  BUN 65* 59* 65* 56* 48*  CREATININE 1.57* 1.48* 1.57* 1.56* 1.52*  CALCIUM 9.4 8.9 9.0 9.1 8.4    Liver Function Tests:  Recent Labs Lab 06/05/14 0624  AST 57*  ALT 93*  ALKPHOS 202*  BILITOT 0.5  PROT 8.1  ALBUMIN 3.9   No results for input(s): LIPASE, AMYLASE in the last 168 hours. No results for input(s): AMMONIA in the last 168 hours.  CBC:  Recent Labs Lab 06/05/14 0324 06/07/14 0320 06/08/14 0330 06/09/14 0353 06/10/14 0429  WBC 16.8* 10.2 11.4* 12.1* 10.3  NEUTROABS 14.5*  --   --   --   --   HGB 12.0* 11.7* 12.0* 11.2* 10.4*  HCT 36.9* 34.2* 36.0* 33.5* 31.3*  MCV 86.2 86.1 86.7 85.0 85.1  PLT 360 287 322 309 286    Cardiac Enzymes: No results for input(s): CKTOTAL, CKMB, CKMBINDEX, TROPONINI in the last 168 hours.  BNP: BNP (last 3 results)  Recent Labs  03/01/14 1959 03/08/14 2230 06/05/14 0332  PROBNP 11880.0* 15334.0* 19533.0*     Other results:    Imaging: No results found.  Medications:     Scheduled Medications: . amiodarone  200 mg Oral BID  . magnesium oxide  400 mg Oral BID  . metolazone  5 mg Oral Once per day on Sun Thu  . potassium chloride  40 mEq Oral Once  . senna-docusate  2 tablet Oral Daily  . sildenafil  20 mg Oral TID  . sodium chloride  10-40 mL Intracatheter Q12H  . sodium chloride  3 mL Intravenous Q12H  . spironolactone  25 mg Oral Daily  . torsemide  80 mg Oral Daily  . warfarin  2 mg Oral Q T,Th,Sat-1800  . [START ON 06/11/2014] warfarin  4 mg Oral Q M,W,F,Su-1800  . Warfarin - Pharmacist Dosing Inpatient   Does not apply q1800    Infusions: . sodium chloride Stopped (06/05/14 2000)  . milrinone 0.5 mcg/kg/min  (06/10/14 0441)    PRN Medications: sodium chloride, acetaminophen, ondansetron (ZOFRAN) IV, sodium chloride, sodium chloride, sorbitol, traMADol   Assessment:  1. Acute on chronic combined systolic and diastolic heart failure 2. Elevated WBC  3. NICM s/p Medtronic BiV-ICD 10/26/12 4. chronic LBBB 5. PAF on coumadin, s/p DCCV in July 2015 6. HTN 7. HLD 8. CVD 9. Severe pulm HTN on Revatio  10. Hyponatremia/hypokalemia 11. Retroperitoneal mass  Plan/Discussion:    Stable overnight. Feels much better this am and CVP now 7. K+ low, 2.8. Will replace. Will send home today with close follow up in the HF clinic. Send home on torsemide 80 mg daily.   No BB due to low output. Continue sildenafil    Length of Stay: 5 Rande Brunt NP-C 06/10/2014, 7:40 AM  Patient seen and examined with Junie Bame, NP. We discussed all aspects of the encounter. I agree with the assessment and plan as stated above.   He can go home today on higher dose of milrinone (0.5). Will supp K+ prior to d/c. He tends to be noncompliant with Kcl at home so will increase spiro to 25 daily to help. See back in HF Clinic next week.   Daniel Bensimhon,MD 9:55 AM

## 2014-06-10 NOTE — Progress Notes (Signed)
ANTICOAGULATION CONSULT NOTE - Follow Up Consult  Pharmacy Consult for Coumadin Indication: atrial fibrillation  Allergies  Allergen Reactions  . Lisinopril Rash    Patient Measurements: Height: 5\' 6"  (167.6 cm) Weight: 145 lb 8.1 oz (66 kg) IBW/kg (Calculated) : 63.8  Vital Signs: Temp: 97.5 F (36.4 C) (12/22 0435) Temp Source: Oral (12/22 0435) BP: 88/44 mmHg (12/22 0435)  Labs:  Recent Labs  06/08/14 0330 06/09/14 0353 06/10/14 0429  HGB 12.0* 11.2* 10.4*  HCT 36.0* 33.5* 31.3*  PLT 322 309 286  LABPROT 25.1* 24.2* 28.6*  INR 2.25* 2.16* 2.66*  CREATININE 1.57* 1.56* 1.52*    Estimated Creatinine Clearance: 35.6 mL/min (by C-G formula based on Cr of 1.52).  Assessment: 81yom continues on coumadin for afib. INR on admit slightly SUBtherapeutic at 1.96 - recently increased as outpt.  Now remains therapeutic at 2.66. H/H remains slightly low but stable, plt wnl. No bleeding reported. Also on amiodarone as pta.  Home Coumadin dose: 4 mg po daily except 2 mg MWF (verified by last Coumadin visit)  Goal of Therapy:  INR 2-3 Monitor platelets by anticoagulation protocol: Yes   Plan:  1)  restart home regimen 12/22 for plans for d/c  2) Daily INR for now Recheck INR this week with [facility] - if pt d/c today  Bonnita Nasuti Pharm.D. CPP, BCPS Clinical Pharmacist [phone] 06/10/2014 7:33 AM

## 2014-06-10 NOTE — Progress Notes (Signed)
06/10/2014 12:05 PM   Discharge instructions given written in [place] and [place]. Translator used for instructions. Verified understanding. 2nd port on PICC heparin locked. Home Mil. Gtt started via home pump. All belongings with Pt awaiting ride home. Claressa Hughley, Carolynn Comment

## 2014-06-11 ENCOUNTER — Telehealth (HOSPITAL_COMMUNITY): Payer: Self-pay | Admitting: Vascular Surgery

## 2014-06-11 MED ORDER — POTASSIUM CHLORIDE CRYS ER 20 MEQ PO TBCR: 60.0000 meq | EXTENDED_RELEASE_TABLET | Freq: Two times a day (BID) | ORAL | Status: DC

## 2014-06-11 NOTE — Telephone Encounter (Signed)
Pt need new prescription w/ New dosage for POTASSIUM

## 2014-06-12 ENCOUNTER — Other Ambulatory Visit (HOSPITAL_COMMUNITY): Payer: Self-pay | Admitting: Adult Health

## 2014-06-12 ENCOUNTER — Other Ambulatory Visit (HOSPITAL_COMMUNITY): Payer: Self-pay | Admitting: Internal Medicine

## 2014-06-12 DIAGNOSIS — I5022 Chronic systolic (congestive) heart failure: Secondary | ICD-10-CM

## 2014-06-12 LAB — CULTURE, BLOOD (ROUTINE X 2)
CULTURE: NO GROWTH
Culture: NO GROWTH

## 2014-06-12 LAB — POCT INR: INR: 2.2

## 2014-06-14 ENCOUNTER — Other Ambulatory Visit (HOSPITAL_COMMUNITY): Payer: Self-pay | Admitting: Anesthesiology

## 2014-06-16 ENCOUNTER — Encounter: Payer: Self-pay | Admitting: Internal Medicine

## 2014-06-16 ENCOUNTER — Ambulatory Visit (INDEPENDENT_AMBULATORY_CARE_PROVIDER_SITE_OTHER): Payer: Medicare Other

## 2014-06-16 DIAGNOSIS — I4891 Unspecified atrial fibrillation: Secondary | ICD-10-CM

## 2014-06-17 ENCOUNTER — Ambulatory Visit (INDEPENDENT_AMBULATORY_CARE_PROVIDER_SITE_OTHER): Payer: Medicare Other | Admitting: Cardiology

## 2014-06-17 ENCOUNTER — Ambulatory Visit (HOSPITAL_COMMUNITY): Admit: 2014-06-17 | Payer: Medicare Other

## 2014-06-17 DIAGNOSIS — I4891 Unspecified atrial fibrillation: Secondary | ICD-10-CM

## 2014-06-17 LAB — POCT INR: INR: 1.8

## 2014-06-19 ENCOUNTER — Encounter (HOSPITAL_COMMUNITY): Payer: Self-pay

## 2014-06-19 ENCOUNTER — Ambulatory Visit (HOSPITAL_COMMUNITY)
Admission: RE | Admit: 2014-06-19 | Discharge: 2014-06-19 | Disposition: A | Discharge status: Home / Self Care | Payer: Medicare Other | Source: Ambulatory Visit | Attending: Cardiology | Admitting: Cardiology

## 2014-06-19 VITALS — BP 90/48 | HR 85 | Wt 152.8 lb

## 2014-06-19 DIAGNOSIS — I5022 Chronic systolic (congestive) heart failure: Secondary | ICD-10-CM | POA: Diagnosis not present

## 2014-06-19 DIAGNOSIS — I270 Primary pulmonary hypertension: Secondary | ICD-10-CM

## 2014-06-19 DIAGNOSIS — I2721 Secondary pulmonary arterial hypertension: Secondary | ICD-10-CM

## 2014-06-19 DIAGNOSIS — N183 Chronic kidney disease, stage 3 unspecified: Secondary | ICD-10-CM

## 2014-06-19 DIAGNOSIS — I129 Hypertensive chronic kidney disease with stage 1 through stage 4 chronic kidney disease, or unspecified chronic kidney disease: Secondary | ICD-10-CM | POA: Insufficient documentation

## 2014-06-19 DIAGNOSIS — Z7901 Long term (current) use of anticoagulants: Secondary | ICD-10-CM | POA: Diagnosis not present

## 2014-06-19 DIAGNOSIS — R21 Rash and other nonspecific skin eruption: Secondary | ICD-10-CM | POA: Insufficient documentation

## 2014-06-19 DIAGNOSIS — I480 Paroxysmal atrial fibrillation: Secondary | ICD-10-CM | POA: Diagnosis not present

## 2014-06-19 DIAGNOSIS — Z79899 Other long term (current) drug therapy: Secondary | ICD-10-CM | POA: Diagnosis not present

## 2014-06-19 DIAGNOSIS — I272 Other secondary pulmonary hypertension: Secondary | ICD-10-CM | POA: Insufficient documentation

## 2014-06-19 DIAGNOSIS — K5900 Constipation, unspecified: Secondary | ICD-10-CM | POA: Insufficient documentation

## 2014-06-19 DIAGNOSIS — I252 Old myocardial infarction: Secondary | ICD-10-CM | POA: Insufficient documentation

## 2014-06-19 DIAGNOSIS — Z66 Do not resuscitate: Secondary | ICD-10-CM | POA: Insufficient documentation

## 2014-06-19 MED ORDER — DOCUSATE SODIUM 100 MG PO CAPS: 100.0000 mg | ORAL_CAPSULE | Freq: Two times a day (BID) | ORAL | Status: DC

## 2014-06-19 MED ORDER — BISACODYL 5 MG PO TBEC: 5.0000 mg | DELAYED_RELEASE_TABLET | Freq: Every day | ORAL | Status: DC | PRN

## 2014-06-19 NOTE — Patient Instructions (Signed)
START Colace 100 twice a day And Bisacodyl 5 mg daily as needed for consatpation   Your physician recommends that you schedule a follow-up appointment in:  2 weeks  Do the following things EVERYDAY: 1) Weigh yourself in the morning before breakfast. Write it down and keep it in a log. 2) Take your medicines as prescribed 3) Eat low salt foods-Limit salt (sodium) to 2000 mg per day.  4) Stay as active as you can everyday 5) Limit all fluids for the day to less than 2 liters 6)

## 2014-06-21 NOTE — Progress Notes (Signed)
Patient ID: Tyler Christensen, male   DOB: 09/07/1932, 81 y.o.   MRN: [id number]  [facility]  HPI: [name] is a 81 y.o. [name] (non-english speaking) w/ PMHx significant for chronic systolic CHF (EF 50%), NICM, LBBB, PAF (on coumadin), HTN, HLD, CVA, Pulmonary Hypertension and h/o seizure d/o (2/2 CVA 05/2012).   Admitted to [facility] 06/2012 with acute decompensated heart failure EF 20%. As noted below, also diagnosed with severe pulmonary hypertension (PAPs ~ 90) from initial RHC and placed on Milrinone and Revatio (eventually transitioned to tadalafil 40) with marked clinical response and decrease in PA pressures. VQ low probability for pulmonary embolus.  Discharged on Milrinone at 0.375 mcg via PICC. Discharge Weight 146 pounds. Attempted to wean milrinone in 9/14 but failed due to worsening HF and PAH. Milrinone restarted.  Admitted to the hospital 5/21-11/22/13. Found to have staph aureus bacteremia treated with IV abx. TEE negative for endocarditis. Also found to retroperitoneal mass and biopsy revealed benign lesion. He had evidence of low output thought to be due to atrial fibrillation and milrinone increased. Attempted TEE and DC-CV but had LAA clot so deferred cardioversion.  He was admitted in 12/15 with acute on chronic systolic CHF.  He was diuresed with IV Lasix and milrinone was increased to 0.5 mcg/kg/min.  Currently, he is doing ok.  Weight is stable, staying in the 145-147 range on home scale.  He is able to walk short distances on flat ground without dyspnea.  He does have orthopnea, this has been chronic.  He has some abdominal discomfort and is constipated.  He is uncomfortable after eating.     NO ACE-I 2/2 rash   08/14/12 S/P successful DC-CV of AF 10/26/12 S/P BiV-ICD implant 01/07/13 ECHO EF 20%, restrictive diastolic function, mildly decreased RV size and systolic fxn, mild to moderate MR, PA systolic pressure 59 mmHg  02/21/13 ECHO EF 20% PA pressure mean 59 11/12/13: ECHO EF 20-25%, trivial  AI,    RHC 02/21/13 (off milrinone) RA = 9 with v-waves to 20  RV = 72/8/20  PA = 69/27 (44)  PCW = 18-20  Fick cardiac output/index = 2.7/1.5  PVR = 9.6 woods  FA sat = 96%  PA sat = 48%, 52%  Labs      (06/25/13) K 4.2 Creatinine 1.58 Magnesium 2.0     (07/16/13) K 4.2, Cr 1.67    (2/15) creatinine 1.97 => 1.94, K 4.6, HCT 39.4    (4/15) K 4.5, creatinine 1.56, pro-BNP 6912    (4/15) K 3.9, creatinine 2.39, mag 2.1     (09/2013) K 3.7, creatinine 2.03     11/26/13 K 4.6 cr 2.8 hgb 13.2     12/11/13 K 3.4 Creatinine 2.14  Magnesium 2.1     12/19/13: K+ 3.4, creatinine 2.42, BUN 57     01/14/14: K 3.8, creatinine 2.45, BUN 45, mag 2.1    12/15 K 3.6, creatinine 1.68, HCT 34.4    SH: Lives with his wife here in [place].   ROS: All systems negative except as listed in HPI, PMH and Problem List.  Past Medical History  Diagnosis Date  . HTN (hypertension)   . High cholesterol   . CHF (congestive heart failure)   . BBBB (bilateral bundle branch block)   . LBBB (left bundle branch block)   . Seizures 06/11/2012    new onset/notes (06/11/2012)  . SOB (shortness of breath)     "sometimes when I lay down;; related to not taking  my RX" (06/11/2012)  . Myocardial infarction     06/10/2012  . Atrial fibrillation   . Stroke   . Atrial thrombus     left  . Pulmonary HTN     Current Outpatient Prescriptions  Medication Sig Dispense Refill  . amiodarone (PACERONE) 200 MG tablet TAKE 1 TABLET BY MOUTH TWICE DAILY 60 tablet 6  . magnesium oxide (MAG-OX) 400 MG tablet Take 1 tablet (400 mg total) by mouth 2 (two) times daily. 60 tablet 3  . metolazone (ZAROXOLYN) 5 MG tablet Take 1 tablet (5 mg total) by mouth 2 (two) times a week. Every Sunday and Thursday (Patient taking differently: Take 5 mg by mouth daily. Every Sunday and Thursday) 60 tablet 0  . milrinone (PRIMACOR) 20 MG/100ML SOLN infusion Inject 33.95 mcg/min into the vein continuous. 100 mL 0  . potassium chloride SA  (K-DUR,KLOR-CON) 20 MEQ tablet Take 3 tablets (60 mEq total) by mouth 2 (two) times daily. 180 tablet 3  . sildenafil (REVATIO) 20 MG tablet Take 1 tablet (20 mg total) by mouth 3 (three) times daily. 90 tablet 3  . sorbitol 70 % SOLN Take 30 mLs by mouth daily as needed for moderate constipation. 500 mL 3  . spironolactone (ALDACTONE) 25 MG tablet Take 1 tablet (25 mg total) by mouth daily. 30 tablet 3  . torsemide (DEMADEX) 20 MG tablet Take 4 tablets (80 mg total) by mouth daily. 120 tablet 3  . traMADol (ULTRAM) 50 MG tablet TAKE 1 TABLET BY MOUTH TWICE DAILY AS NEEDED FOR PAIN 60 tablet 0  . warfarin (COUMADIN) 4 MG tablet Take 4 mg Monday, Wednesday, Friday and Sunday. 2 mg on Tues, Thurs and Sat 45 tablet 3  . bisacodyl (BISACODYL) 5 MG EC tablet Take 1 tablet (5 mg total) by mouth daily as needed for moderate constipation. 30 tablet 0  . docusate sodium (COLACE) 100 MG capsule Take 1 capsule (100 mg total) by mouth 2 (two) times daily. 10 capsule 0   No current facility-administered medications for this encounter.    Filed Vitals:   06/19/14 1207  BP: 90/48  Pulse: 85  Weight: 152 lb 12.8 oz (69.31 kg)  SpO2: 89%   PHYSICAL EXAM: General:  Elderly, chronically ill appearing. NAD.No resp difficulty; interpreter present HEENT: normal Neck: supple. JVD 7-8; Carotids 2+ bilaterally; no bruits. No lymphadenopathy or thryomegaly appreciated.  Cor: PMI laterally displaced. Regular rhythm and rate. No s3 or murmur.  Lungs: clear Abdomen: soft, nontender, mildy distended. No hepatosplenomegaly. No bruits or masses. Good bowel sounds. Extremities: no cyanosis, clubbing, rash. 1+ edema 1/2 up lower legs bilaterally.   Neuro: alert & orientedx3, cranial nerves grossly intact. Moves all 4 extremities w/o difficulty. Affect pleasant.   ASSESSMENT & PLAN:  1. Chronic systolic CHF: NICM, EF 06% s/p Medtr[employer] CRT-D; patient is on milrinone, recently increased to 0.5 mcg/kg/min.  He is not  interested in palliative care or hospice at this time. He is a DNR. NYHA III symptoms and volume status stable.  - Will continue torsemide 80 mg daily and metolazone 5 mg every Sunday and Thursday. [name] from [facility] is his nurse and fills his pill box. - Continue milrinone 0.5 mcg through PICC and will get weekly labs.   - Not on BB with history of low output and milrinone.  - He is not on ACE-I due to rash and no longer on ARB or spironolactone with renal function. - Continue current spironolactone.   - Discussed the  importance of daily weights, a low sodium diet, and fluid restriction (less than 2 L a day). Instructed to call the HF clinic if weight increases more than 3 lbs overnight or 5 lbs in a week.  2. Paroxysmal atrial fibrillation/flutter: Cardioverted back to NSR 12/27/13. Appears to be in NSR on amiodraone. Will continue amio 200 mg BID. Continue coumadin.  Will need to follow LFTs and TFTs.  3. Pulmonary arterial hypertension: Noted on RHC 02/21/13.  Continue revatio 20 mg TID 40 mg and milrinone.  4. CKD stage III: Creatinine improved on milrinone.  5. Abdominal discomfort: I suspect that some of this is due to right heart failure.  However, he is also constipated.  Will start bid Colace + dulcolax prn.   Tyler Ringel,MD 06/21/2014

## 2014-06-24 ENCOUNTER — Ambulatory Visit (HOSPITAL_COMMUNITY): Admission: RE | Admit: 2014-06-24 | Payer: Medicare Other | Source: Ambulatory Visit

## 2014-06-24 ENCOUNTER — Ambulatory Visit (INDEPENDENT_AMBULATORY_CARE_PROVIDER_SITE_OTHER): Payer: Medicare Other | Admitting: Pharmacist

## 2014-06-24 DIAGNOSIS — I4891 Unspecified atrial fibrillation: Secondary | ICD-10-CM

## 2014-06-24 LAB — POCT INR: INR: 1.7

## 2014-06-25 ENCOUNTER — Telehealth (HOSPITAL_COMMUNITY): Payer: Self-pay

## 2014-06-25 MED ORDER — SPIRONOLACTONE 25 MG PO TABS: 25.0000 mg | ORAL_TABLET | Freq: Two times a day (BID) | ORAL | Status: DC

## 2014-06-25 NOTE — Telephone Encounter (Signed)
Doroteo Bradford pharmacist resident with [facility] spoke with patient's home health nurse [name] concerning K of 2.8, per Dr. Haroldine Laws STOP potassium and increase spironolactone to 25mg  twice daily.  RN verbalized understanding and ensured to forward info to patient and adjust med box in home accordingly.  Rx updated to preferred pharmacy electronically.  Renee Pain

## 2014-07-01 ENCOUNTER — Encounter: Payer: Self-pay | Admitting: Internal Medicine

## 2014-07-01 ENCOUNTER — Ambulatory Visit (INDEPENDENT_AMBULATORY_CARE_PROVIDER_SITE_OTHER): Payer: Medicare Other | Admitting: Cardiovascular Disease

## 2014-07-01 DIAGNOSIS — I4891 Unspecified atrial fibrillation: Secondary | ICD-10-CM

## 2014-07-01 LAB — POCT INR: INR: 2.1

## 2014-07-04 ENCOUNTER — Inpatient Hospital Stay (HOSPITAL_COMMUNITY)
Admission: EM | Admit: 2014-07-04 | Discharge: 2014-07-10 | DRG: 391 | Disposition: A | Discharge status: Home Health | Payer: Medicare Other | Attending: Internal Medicine | Admitting: Internal Medicine

## 2014-07-04 ENCOUNTER — Ambulatory Visit (HOSPITAL_COMMUNITY): Admission: RE | Admit: 2014-07-04 | Payer: Medicare Other | Source: Ambulatory Visit

## 2014-07-04 ENCOUNTER — Emergency Department (HOSPITAL_COMMUNITY): Payer: Medicare Other

## 2014-07-04 ENCOUNTER — Encounter (HOSPITAL_COMMUNITY): Payer: Self-pay | Admitting: *Deleted

## 2014-07-04 DIAGNOSIS — I129 Hypertensive chronic kidney disease with stage 1 through stage 4 chronic kidney disease, or unspecified chronic kidney disease: Secondary | ICD-10-CM | POA: Diagnosis present

## 2014-07-04 DIAGNOSIS — Z95810 Presence of automatic (implantable) cardiac defibrillator: Secondary | ICD-10-CM | POA: Diagnosis present

## 2014-07-04 DIAGNOSIS — I4891 Unspecified atrial fibrillation: Secondary | ICD-10-CM | POA: Diagnosis present

## 2014-07-04 DIAGNOSIS — I272 Other secondary pulmonary hypertension: Secondary | ICD-10-CM | POA: Diagnosis present

## 2014-07-04 DIAGNOSIS — R1909 Other intra-abdominal and pelvic swelling, mass and lump: Secondary | ICD-10-CM | POA: Diagnosis present

## 2014-07-04 DIAGNOSIS — I248 Other forms of acute ischemic heart disease: Secondary | ICD-10-CM | POA: Diagnosis present

## 2014-07-04 DIAGNOSIS — R109 Unspecified abdominal pain: Secondary | ICD-10-CM | POA: Diagnosis present

## 2014-07-04 DIAGNOSIS — Z79899 Other long term (current) drug therapy: Secondary | ICD-10-CM

## 2014-07-04 DIAGNOSIS — I252 Old myocardial infarction: Secondary | ICD-10-CM

## 2014-07-04 DIAGNOSIS — J9610 Chronic respiratory failure, unspecified whether with hypoxia or hypercapnia: Secondary | ICD-10-CM | POA: Diagnosis present

## 2014-07-04 DIAGNOSIS — Z8614 Personal history of Methicillin resistant Staphylococcus aureus infection: Secondary | ICD-10-CM

## 2014-07-04 DIAGNOSIS — I509 Heart failure, unspecified: Secondary | ICD-10-CM

## 2014-07-04 DIAGNOSIS — E876 Hypokalemia: Secondary | ICD-10-CM

## 2014-07-04 DIAGNOSIS — I10 Essential (primary) hypertension: Secondary | ICD-10-CM | POA: Diagnosis present

## 2014-07-04 DIAGNOSIS — Z95828 Presence of other vascular implants and grafts: Secondary | ICD-10-CM

## 2014-07-04 DIAGNOSIS — D649 Anemia, unspecified: Secondary | ICD-10-CM | POA: Diagnosis present

## 2014-07-04 DIAGNOSIS — Z8249 Family history of ischemic heart disease and other diseases of the circulatory system: Secondary | ICD-10-CM

## 2014-07-04 DIAGNOSIS — Z888 Allergy status to other drugs, medicaments and biological substances status: Secondary | ICD-10-CM

## 2014-07-04 DIAGNOSIS — R1900 Intra-abdominal and pelvic swelling, mass and lump, unspecified site: Secondary | ICD-10-CM | POA: Diagnosis present

## 2014-07-04 DIAGNOSIS — E871 Hypo-osmolality and hyponatremia: Secondary | ICD-10-CM | POA: Diagnosis not present

## 2014-07-04 DIAGNOSIS — R7989 Other specified abnormal findings of blood chemistry: Secondary | ICD-10-CM

## 2014-07-04 DIAGNOSIS — K5900 Constipation, unspecified: Secondary | ICD-10-CM | POA: Diagnosis not present

## 2014-07-04 DIAGNOSIS — E785 Hyperlipidemia, unspecified: Secondary | ICD-10-CM | POA: Diagnosis present

## 2014-07-04 DIAGNOSIS — Z7901 Long term (current) use of anticoagulants: Secondary | ICD-10-CM

## 2014-07-04 DIAGNOSIS — Z66 Do not resuscitate: Secondary | ICD-10-CM | POA: Diagnosis present

## 2014-07-04 DIAGNOSIS — R112 Nausea with vomiting, unspecified: Secondary | ICD-10-CM | POA: Diagnosis present

## 2014-07-04 DIAGNOSIS — R569 Unspecified convulsions: Secondary | ICD-10-CM | POA: Diagnosis present

## 2014-07-04 DIAGNOSIS — I5022 Chronic systolic (congestive) heart failure: Secondary | ICD-10-CM | POA: Diagnosis present

## 2014-07-04 DIAGNOSIS — I5023 Acute on chronic systolic (congestive) heart failure: Secondary | ICD-10-CM | POA: Diagnosis present

## 2014-07-04 DIAGNOSIS — I428 Other cardiomyopathies: Secondary | ICD-10-CM | POA: Diagnosis present

## 2014-07-04 DIAGNOSIS — I452 Bifascicular block: Secondary | ICD-10-CM | POA: Diagnosis present

## 2014-07-04 DIAGNOSIS — N184 Chronic kidney disease, stage 4 (severe): Secondary | ICD-10-CM | POA: Diagnosis present

## 2014-07-04 DIAGNOSIS — I480 Paroxysmal atrial fibrillation: Secondary | ICD-10-CM | POA: Diagnosis present

## 2014-07-04 DIAGNOSIS — Z9981 Dependence on supplemental oxygen: Secondary | ICD-10-CM

## 2014-07-04 DIAGNOSIS — Z8673 Personal history of transient ischemic attack (TIA), and cerebral infarction without residual deficits: Secondary | ICD-10-CM

## 2014-07-04 DIAGNOSIS — I959 Hypotension, unspecified: Secondary | ICD-10-CM | POA: Diagnosis present

## 2014-07-04 DIAGNOSIS — E780 Pure hypercholesterolemia: Secondary | ICD-10-CM | POA: Diagnosis present

## 2014-07-04 DIAGNOSIS — R778 Other specified abnormalities of plasma proteins: Secondary | ICD-10-CM | POA: Diagnosis present

## 2014-07-04 LAB — COMPREHENSIVE METABOLIC PANEL
ALT: 134 U/L — AB (ref 0–53)
AST: 71 U/L — AB (ref 0–37)
Albumin: 3.4 g/dL — ABNORMAL LOW (ref 3.5–5.2)
Alkaline Phosphatase: 167 U/L — ABNORMAL HIGH (ref 39–117)
Anion gap: 12 (ref 5–15)
BUN: 38 mg/dL — ABNORMAL HIGH (ref 6–23)
CHLORIDE: 93 meq/L — AB (ref 96–112)
CO2: 28 mmol/L (ref 19–32)
Calcium: 9.1 mg/dL (ref 8.4–10.5)
Creatinine, Ser: 1.54 mg/dL — ABNORMAL HIGH (ref 0.50–1.35)
GFR calc Af Amer: 48 mL/min — ABNORMAL LOW (ref >90)
GFR calc non Af Amer: 41 mL/min — ABNORMAL LOW (ref >90)
Glucose, Bld: 122 mg/dL — ABNORMAL HIGH (ref 70–99)
POTASSIUM: 3 mmol/L — AB (ref 3.5–5.1)
Sodium: 133 mmol/L — ABNORMAL LOW (ref 135–145)
Total Bilirubin: 0.8 mg/dL (ref 0.3–1.2)
Total Protein: 6.8 g/dL (ref 6.0–8.3)

## 2014-07-04 LAB — CBC WITH DIFFERENTIAL/PLATELET
BASOS ABS: 0 10*3/uL (ref 0.0–0.1)
Basophils Relative: 0 % (ref 0–1)
Eosinophils Absolute: 0.1 10*3/uL (ref 0.0–0.7)
Eosinophils Relative: 1 % (ref 0–5)
HCT: 34 % — ABNORMAL LOW (ref 39.0–52.0)
HEMOGLOBIN: 11.6 g/dL — AB (ref 13.0–17.0)
LYMPHS ABS: 0.9 10*3/uL (ref 0.7–4.0)
LYMPHS PCT: 7 % — AB (ref 12–46)
MCH: 27.9 pg (ref 26.0–34.0)
MCHC: 34.1 g/dL (ref 30.0–36.0)
MCV: 81.7 fL (ref 78.0–100.0)
Monocytes Absolute: 0.9 10*3/uL (ref 0.1–1.0)
Monocytes Relative: 7 % (ref 3–12)
NEUTROS ABS: 10.8 10*3/uL — AB (ref 1.7–7.7)
Neutrophils Relative %: 85 % — ABNORMAL HIGH (ref 43–77)
Platelets: 303 10*3/uL (ref 150–400)
RBC: 4.16 MIL/uL — ABNORMAL LOW (ref 4.22–5.81)
RDW: 15.5 % (ref 11.5–15.5)
WBC: 12.7 10*3/uL — AB (ref 4.0–10.5)

## 2014-07-04 LAB — PROTIME-INR
INR: 2.09 — ABNORMAL HIGH (ref 0.00–1.49)
Prothrombin Time: 23.6 seconds — ABNORMAL HIGH (ref 11.6–15.2)

## 2014-07-04 LAB — LIPASE, BLOOD: Lipase: 33 U/L (ref 11–59)

## 2014-07-04 LAB — TROPONIN I: Troponin I: 0.04 ng/mL — ABNORMAL HIGH (ref <0.031)

## 2014-07-04 LAB — I-STAT TROPONIN, ED: TROPONIN I, POC: 0.02 ng/mL (ref 0.00–0.08)

## 2014-07-04 MED ORDER — POTASSIUM CHLORIDE CRYS ER 20 MEQ PO TBCR
40.0000 meq | EXTENDED_RELEASE_TABLET | Freq: Once | ORAL | Status: AC
  Administered 2014-07-05: 40 meq via ORAL
  Filled 2014-07-04: qty 2

## 2014-07-04 MED ORDER — IOHEXOL 300 MG/ML  SOLN: 100.0000 mL | Freq: Once | INTRAMUSCULAR | Status: AC | PRN

## 2014-07-04 MED ORDER — ONDANSETRON HCL 4 MG/2ML IJ SOLN
INTRAMUSCULAR | Status: AC
  Administered 2014-07-04: 4 mg
  Filled 2014-07-04: qty 2

## 2014-07-04 MED ORDER — IOHEXOL 300 MG/ML  SOLN
25.0000 mL | INTRAMUSCULAR | Status: AC
  Administered 2014-07-04: 25 mL via ORAL

## 2014-07-04 MED ORDER — IOHEXOL 300 MG/ML  SOLN
25.0000 mL | Freq: Once | INTRAMUSCULAR | Status: DC | PRN
Start: 2014-07-04 — End: 2014-07-04

## 2014-07-04 MED ORDER — HYDROMORPHONE HCL 1 MG/ML IJ SOLN
1.0000 mg | Freq: Once | INTRAMUSCULAR | Status: AC
  Administered 2014-07-04: 1 mg via INTRAVENOUS
  Filled 2014-07-04: qty 1

## 2014-07-04 MED ORDER — ONDANSETRON HCL 4 MG/2ML IJ SOLN: 4.0000 mg | Freq: Once | INTRAMUSCULAR | Status: AC

## 2014-07-04 MED ORDER — ONDANSETRON HCL 4 MG/2ML IJ SOLN
4.0000 mg | Freq: Once | INTRAMUSCULAR | Status: DC
  Filled 2014-07-04: qty 2

## 2014-07-04 NOTE — ED Notes (Signed)
Spoke with CT about Patient needing to have PICC line assessed before use.

## 2014-07-04 NOTE — Plan of Care (Signed)
Spoke with ED regarding the patient who has a positive borderline troponin and is on milrinone and has renal failure. Recommended observation overnight under hospitalist/ED care with cycling the troponin and if no delta and epigastric pain resolves with bowel regimen, can discharge to follow up with primary cardiologist. If patient has different outcome, can cardiac evaluation can be performed in the morning.

## 2014-07-04 NOTE — ED Provider Notes (Signed)
CSN: [id number]     Arrival date & time 07/04/14  1840 History   First MD Initiated Contact with Patient 07/04/14 1914     Chief Complaint  Patient presents with  . Abdominal Pain  . Constipation     (Consider location/radiation/quality/duration/timing/severity/associated sxs/prior Treatment) Patient is a 81 y.o. male presenting with abdominal pain and constipation. The history is provided by the patient.  Abdominal Pain Pain location:  Generalized Pain quality: bloating   Pain quality: not aching   Pain radiates to:  Does not radiate Pain severity:  Moderate Onset quality:  Gradual Timing:  Constant Progression:  Worsening Chronicity:  Recurrent Relieved by:  Nothing Worsened by:  Nothing tried Associated symptoms: constipation (last 6 days)   Associated symptoms: no cough, no diarrhea, no fever and no shortness of breath   Constipation Associated symptoms: abdominal pain   Associated symptoms: no diarrhea and no fever     Past Medical History  Diagnosis Date  . HTN (hypertension)   . High cholesterol   . CHF (congestive heart failure)   . BBBB (bilateral bundle branch block)   . LBBB (left bundle branch block)   . Seizures 06/11/2012    new onset/notes (06/11/2012)  . SOB (shortness of breath)     "sometimes when I lay down;; related to not taking my RX" (06/11/2012)  . Myocardial infarction     06/10/2012  . Atrial fibrillation   . Stroke   . Atrial thrombus     left  . Pulmonary HTN    Past Surgical History  Procedure Laterality Date  . Throat surgery  1942    "and nose" (06/11/2012); ?T&A  . Inguinal hernia repair  ~ 2008    "both sides" (06/11/2012)  . Tee without cardioversion  06/29/2012    Procedure: TRANSESOPHAGEAL ECHOCARDIOGRAM (TEE);  Surgeon: Jolaine Artist, MD;  Location: [facility] ENDOSCOPY;  Service: Cardiovascular;  Laterality: N/A;  will need a spanish interpreter Interpreter will be here at 1330-Hope spoke w/ [place]  . Cardioversion N/A  08/14/2012    Procedure: CARDIOVERSION;  Surgeon: Jolaine Artist, MD;  Location: [facility] ENDOSCOPY;  Service: Cardiovascular;  Laterality: N/A;  . Tee without cardioversion N/A 11/12/2013    Procedure: TRANSESOPHAGEAL ECHOCARDIOGRAM (TEE);  Surgeon: Candee Furbish, MD;  Location: [facility] ENDOSCOPY;  Service: Cardiovascular;  Laterality: N/A;  . Tee without cardioversion N/A 11/19/2013    Procedure: TRANSESOPHAGEAL ECHOCARDIOGRAM (TEE);  Surgeon: Jolaine Artist, MD;  Location: [facility] ENDOSCOPY;  Service: Cardiovascular;  Laterality: N/A;  . Cardioversion N/A 12/27/2013    Procedure: CARDIOVERSION;  Surgeon: Jolaine Artist, MD;  Location: [facility] ENDOSCOPY;  Service: Cardiovascular;  Laterality: N/A;  . Left and right heart catheterization with coronary angiogram N/A 07/02/2012    Procedure: LEFT AND RIGHT HEART CATHETERIZATION WITH CORONARY ANGIOGRAM;  Surgeon: Jolaine Artist, MD;  Location: [facility] CATH LAB;  Service: Cardiovascular;  Laterality: N/A;  . Right heart catheterization N/A 07/06/2012    Procedure: RIGHT HEART CATH;  Surgeon: Jolaine Artist, MD;  Location: [facility] CATH LAB;  Service: Cardiovascular;  Laterality: N/A;  . Bi-ventricular implantable cardioverter defibrillator N/A 10/26/2012    Procedure: BI-VENTRICULAR IMPLANTABLE CARDIOVERTER DEFIBRILLATOR  (CRT-D);  Surgeon: Evans Lance, MD;  Location: [facility] CATH LAB;  Service: Cardiovascular;  Laterality: N/A;   Family History  Problem Relation Age of Onset  . Heart attack      father had MI in his 34s, no fhx of early heart problem before age 26  . Heart  attack      mother died of MI around 10-12-98   History  Substance Use Topics  . Smoking status: Never Smoker   . Smokeless tobacco: Never Used  . Alcohol Use: No    Review of Systems  Constitutional: Negative for fever.  Respiratory: Negative for cough and shortness of breath.   Gastrointestinal: Positive for abdominal pain and constipation (last 6 days). Negative for diarrhea.  All other  systems reviewed and are negative.     Allergies  Lisinopril  Home Medications   Prior to Admission medications   Medication Sig Start Date End Date Taking? Authorizing Provider  amiodarone (PACERONE) 200 MG tablet TAKE 1 TABLET BY MOUTH TWICE DAILY 06/12/14   Jolaine Artist, MD  bisacodyl (BISACODYL) 5 MG EC tablet Take 1 tablet (5 mg total) by mouth daily as needed for moderate constipation. 06/19/14   Larey Dresser, MD  docusate sodium (COLACE) 100 MG capsule Take 1 capsule (100 mg total) by mouth 2 (two) times daily. 06/19/14   Larey Dresser, MD  magnesium oxide (MAG-OX) 400 MG tablet Take 1 tablet (400 mg total) by mouth 2 (two) times daily. 04/25/14   Jolaine Artist, MD  metolazone (ZAROXOLYN) 5 MG tablet Take 1 tablet (5 mg total) by mouth 2 (two) times a week. Every 2022-10-12 and Thursday Patient taking differently: Take 5 mg by mouth daily. Every 12-Oct-2022 and Thursday 04/21/14   [name] D Clegg, NP  milrinone (PRIMACOR) 20 MG/100ML SOLN infusion Inject 33.95 mcg/min into the vein continuous. 06/10/14   Rande Brunt, NP  sildenafil (REVATIO) 20 MG tablet Take 1 tablet (20 mg total) by mouth 3 (three) times daily. 03/07/14   Rande Brunt, NP  sorbitol 70 % SOLN Take 30 mLs by mouth daily as needed for moderate constipation. 04/10/14   Rande Brunt, NP  spironolactone (ALDACTONE) 25 MG tablet Take 1 tablet (25 mg total) by mouth 2 (two) times daily. 06/25/14   Jolaine Artist, MD  torsemide (DEMADEX) 20 MG tablet Take 4 tablets (80 mg total) by mouth daily. 04/25/14   Jolaine Artist, MD  traMADol (ULTRAM) 50 MG tablet TAKE 1 TABLET BY MOUTH TWICE DAILY AS NEEDED FOR PAIN 06/02/14   Jolaine Artist, MD  warfarin (COUMADIN) 4 MG tablet Take 4 mg Monday, Wednesday, Friday and October 12, 2022. 2 mg on Tues, Thurs and Sat 06/10/14   Rande Brunt, NP   BP 104/56 mmHg  Pulse 87  Temp(Src) 97.9 F (36.6 C) (Oral)  Resp 28  SpO2 91% Physical Exam  Constitutional: He is oriented to  person, place, and time. He appears well-developed and well-nourished. No distress.  HENT:  Head: Normocephalic and atraumatic.  Mouth/Throat: No oropharyngeal exudate.  Eyes: EOM are normal. Pupils are equal, round, and reactive to light.  Neck: Normal range of motion. Neck supple.  Cardiovascular: Normal rate and regular rhythm.  Exam reveals no friction rub.   No murmur heard. Pulmonary/Chest: Effort normal and breath sounds normal. No respiratory distress. He has no wheezes. He has no rales.  Abdominal: He exhibits distension. There is tenderness. There is no rebound.  Musculoskeletal: Normal range of motion. He exhibits no edema.  Neurological: He is alert and oriented to person, place, and time.  Skin: He is not diaphoretic.  Nursing note and vitals reviewed.   ED Course  Procedures (including critical care time) Labs Review Labs Reviewed  CBC WITH DIFFERENTIAL  COMPREHENSIVE METABOLIC PANEL  LIPASE, BLOOD  PROTIME-INR  TROPONIN I  Randolm Idol, ED    Imaging Review Dg Chest Portable 1 View  07/05/2014   CLINICAL DATA:  Port-A-Cath in place.  Vomiting for 4 days.  EXAM: PORTABLE CHEST - 1 VIEW  COMPARISON:  06/05/2014  FINDINGS: Tip of the right central line in the atrial caval junction. Multi lead left-sided pacemaker remains in place. Lower lung volumes from prior. Equivocal increase in cardiomegaly versus differences in technique. There is worsening vascular congestion and pulmonary edema. Questionable blunting of right costophrenic angle effusion. No pneumothorax.  IMPRESSION: 1. Tip of the right central line at the atrial caval junction. 2. Findings consistent with worsening CHF.   Electronically Signed   By: Jeb Levering M.D.   On: 07/05/2014 00:12     EKG Interpretation   Date/Time:  Friday July 04 2014 19:05:58 EST Ventricular Rate:  89 PR Interval:  110 QRS Duration: 206 QT Interval:  548 QTC Calculation: 666 R Axis:   -118 Text Interpretation:   Atrial-sensed ventricular-paced rhythm Biventricular  pacemaker detected Abnormal ECG Similar to prior Confirmed by Mingo Amber  MD,  [place] ([id number]) on 07/04/2014 7:47:24 PM      MDM   Final diagnoses:  Abdominal swelling  Portacath in place    81 year old male with history of heart failure on continuous milrinone drip presents with abdominal bloating and constipation. Last bowel movement 6 days ago. No vomiting. He states worsening abdominal swelling and pain. No diarrhea. On exam he has a distended abdomen with diffuse pain. He has soft stool in the rectal vault, no concern for impaction. We'll scan to look for possible obstruction. Troponin mildly elevated - here for abdominal complaints. I spoke with Cardiology concerning the troponin, the fellow recommended serial troponins with medicine admit. Dr. Christy Gentles to f/u with patient's CT scan.   Evelina Bucy, MD 07/05/14 [phone]

## 2014-07-04 NOTE — ED Notes (Signed)
IV Team at the bedside. 

## 2014-07-04 NOTE — ED Notes (Signed)
IV Team approved use if chest Xray shows PICC is in SVC.

## 2014-07-04 NOTE — ED Notes (Addendum)
Pt reports having abd pain and constipation x 5 days. Heart pt, was given a prescription laxative which he took and no relief. Pt is on milrinone drip.

## 2014-07-04 NOTE — ED Notes (Signed)
X-ray at bedside

## 2014-07-05 ENCOUNTER — Emergency Department (HOSPITAL_COMMUNITY): Payer: Medicare Other

## 2014-07-05 ENCOUNTER — Encounter (HOSPITAL_COMMUNITY): Payer: Self-pay | Admitting: Radiology

## 2014-07-05 DIAGNOSIS — I5022 Chronic systolic (congestive) heart failure: Secondary | ICD-10-CM

## 2014-07-05 DIAGNOSIS — R7989 Other specified abnormal findings of blood chemistry: Secondary | ICD-10-CM

## 2014-07-05 DIAGNOSIS — I480 Paroxysmal atrial fibrillation: Secondary | ICD-10-CM | POA: Diagnosis present

## 2014-07-05 DIAGNOSIS — E785 Hyperlipidemia, unspecified: Secondary | ICD-10-CM | POA: Diagnosis present

## 2014-07-05 DIAGNOSIS — Z7901 Long term (current) use of anticoagulants: Secondary | ICD-10-CM

## 2014-07-05 DIAGNOSIS — I272 Other secondary pulmonary hypertension: Secondary | ICD-10-CM | POA: Diagnosis present

## 2014-07-05 DIAGNOSIS — I252 Old myocardial infarction: Secondary | ICD-10-CM | POA: Diagnosis not present

## 2014-07-05 DIAGNOSIS — Z8673 Personal history of transient ischemic attack (TIA), and cerebral infarction without residual deficits: Secondary | ICD-10-CM | POA: Diagnosis not present

## 2014-07-05 DIAGNOSIS — E871 Hypo-osmolality and hyponatremia: Secondary | ICD-10-CM | POA: Diagnosis not present

## 2014-07-05 DIAGNOSIS — D649 Anemia, unspecified: Secondary | ICD-10-CM | POA: Diagnosis present

## 2014-07-05 DIAGNOSIS — R109 Unspecified abdominal pain: Secondary | ICD-10-CM

## 2014-07-05 DIAGNOSIS — Z9981 Dependence on supplemental oxygen: Secondary | ICD-10-CM | POA: Diagnosis not present

## 2014-07-05 DIAGNOSIS — R569 Unspecified convulsions: Secondary | ICD-10-CM | POA: Diagnosis present

## 2014-07-05 DIAGNOSIS — N184 Chronic kidney disease, stage 4 (severe): Secondary | ICD-10-CM | POA: Diagnosis present

## 2014-07-05 DIAGNOSIS — I10 Essential (primary) hypertension: Secondary | ICD-10-CM

## 2014-07-05 DIAGNOSIS — I248 Other forms of acute ischemic heart disease: Secondary | ICD-10-CM | POA: Diagnosis present

## 2014-07-05 DIAGNOSIS — R1900 Intra-abdominal and pelvic swelling, mass and lump, unspecified site: Secondary | ICD-10-CM | POA: Insufficient documentation

## 2014-07-05 DIAGNOSIS — R1909 Other intra-abdominal and pelvic swelling, mass and lump: Secondary | ICD-10-CM | POA: Diagnosis present

## 2014-07-05 DIAGNOSIS — I959 Hypotension, unspecified: Secondary | ICD-10-CM | POA: Diagnosis present

## 2014-07-05 DIAGNOSIS — Z8614 Personal history of Methicillin resistant Staphylococcus aureus infection: Secondary | ICD-10-CM | POA: Diagnosis not present

## 2014-07-05 DIAGNOSIS — I270 Primary pulmonary hypertension: Secondary | ICD-10-CM

## 2014-07-05 DIAGNOSIS — I5023 Acute on chronic systolic (congestive) heart failure: Secondary | ICD-10-CM | POA: Diagnosis present

## 2014-07-05 DIAGNOSIS — J9610 Chronic respiratory failure, unspecified whether with hypoxia or hypercapnia: Secondary | ICD-10-CM | POA: Diagnosis present

## 2014-07-05 DIAGNOSIS — R778 Other specified abnormalities of plasma proteins: Secondary | ICD-10-CM | POA: Diagnosis present

## 2014-07-05 DIAGNOSIS — Z79899 Other long term (current) drug therapy: Secondary | ICD-10-CM | POA: Diagnosis not present

## 2014-07-05 DIAGNOSIS — K5900 Constipation, unspecified: Secondary | ICD-10-CM | POA: Diagnosis present

## 2014-07-05 DIAGNOSIS — E876 Hypokalemia: Secondary | ICD-10-CM | POA: Diagnosis present

## 2014-07-05 DIAGNOSIS — Z8249 Family history of ischemic heart disease and other diseases of the circulatory system: Secondary | ICD-10-CM | POA: Diagnosis not present

## 2014-07-05 DIAGNOSIS — I452 Bifascicular block: Secondary | ICD-10-CM | POA: Diagnosis present

## 2014-07-05 DIAGNOSIS — I129 Hypertensive chronic kidney disease with stage 1 through stage 4 chronic kidney disease, or unspecified chronic kidney disease: Secondary | ICD-10-CM | POA: Diagnosis present

## 2014-07-05 DIAGNOSIS — R112 Nausea with vomiting, unspecified: Secondary | ICD-10-CM | POA: Diagnosis present

## 2014-07-05 DIAGNOSIS — Z66 Do not resuscitate: Secondary | ICD-10-CM | POA: Diagnosis present

## 2014-07-05 DIAGNOSIS — E780 Pure hypercholesterolemia: Secondary | ICD-10-CM | POA: Diagnosis present

## 2014-07-05 DIAGNOSIS — I428 Other cardiomyopathies: Secondary | ICD-10-CM | POA: Diagnosis present

## 2014-07-05 DIAGNOSIS — Z888 Allergy status to other drugs, medicaments and biological substances status: Secondary | ICD-10-CM | POA: Diagnosis not present

## 2014-07-05 DIAGNOSIS — Z95810 Presence of automatic (implantable) cardiac defibrillator: Secondary | ICD-10-CM | POA: Diagnosis not present

## 2014-07-05 LAB — CBC
HEMATOCRIT: 34.7 % — AB (ref 39.0–52.0)
Hemoglobin: 11.6 g/dL — ABNORMAL LOW (ref 13.0–17.0)
MCH: 27.8 pg (ref 26.0–34.0)
MCHC: 33.4 g/dL (ref 30.0–36.0)
MCV: 83.2 fL (ref 78.0–100.0)
Platelets: 306 10*3/uL (ref 150–400)
RBC: 4.17 MIL/uL — AB (ref 4.22–5.81)
RDW: 15.6 % — ABNORMAL HIGH (ref 11.5–15.5)
WBC: 14.3 10*3/uL — ABNORMAL HIGH (ref 4.0–10.5)

## 2014-07-05 LAB — MRSA PCR SCREENING: MRSA by PCR: NEGATIVE

## 2014-07-05 LAB — CARBOXYHEMOGLOBIN
Carboxyhemoglobin: 1.2 % (ref 0.5–1.5)
METHEMOGLOBIN: 0.9 % (ref 0.0–1.5)
O2 SAT: 81.5 %
TOTAL HEMOGLOBIN: 10.6 g/dL — AB (ref 13.5–18.0)

## 2014-07-05 LAB — BASIC METABOLIC PANEL
Anion gap: 7 (ref 5–15)
BUN: 38 mg/dL — AB (ref 6–23)
CALCIUM: 9.2 mg/dL (ref 8.4–10.5)
CHLORIDE: 90 meq/L — AB (ref 96–112)
CO2: 33 mmol/L — ABNORMAL HIGH (ref 19–32)
Creatinine, Ser: 1.61 mg/dL — ABNORMAL HIGH (ref 0.50–1.35)
GFR calc Af Amer: 45 mL/min — ABNORMAL LOW (ref >90)
GFR, EST NON AFRICAN AMERICAN: 39 mL/min — AB (ref >90)
Glucose, Bld: 165 mg/dL — ABNORMAL HIGH (ref 70–99)
Potassium: 2.8 mmol/L — ABNORMAL LOW (ref 3.5–5.1)
Sodium: 130 mmol/L — ABNORMAL LOW (ref 135–145)

## 2014-07-05 LAB — PROTIME-INR
INR: 2.18 — ABNORMAL HIGH (ref 0.00–1.49)
PROTHROMBIN TIME: 24.4 s — AB (ref 11.6–15.2)

## 2014-07-05 LAB — MAGNESIUM: Magnesium: 1.9 mg/dL (ref 1.5–2.5)

## 2014-07-05 MED ORDER — LACTULOSE 10 GM/15ML PO SOLN
10.0000 g | Freq: Once | ORAL | Status: AC
  Administered 2014-07-05: 10 g via ORAL
  Filled 2014-07-05: qty 15

## 2014-07-05 MED ORDER — IOHEXOL 350 MG/ML SOLN
80.0000 mL | Freq: Once | INTRAVENOUS | Status: AC | PRN
  Administered 2014-07-05: 80 mL via INTRAVENOUS

## 2014-07-05 MED ORDER — POTASSIUM CHLORIDE CRYS ER 20 MEQ PO TBCR: 30.0000 meq | EXTENDED_RELEASE_TABLET | ORAL | Status: DC

## 2014-07-05 MED ORDER — POTASSIUM CHLORIDE 20 MEQ/15ML (10%) PO SOLN
ORAL | Status: AC
  Administered 2014-07-05: 30 meq via ORAL
  Filled 2014-07-05: qty 30

## 2014-07-05 MED ORDER — POTASSIUM CHLORIDE 20 MEQ/15ML (10%) PO SOLN
30.0000 meq | Freq: Once | ORAL | Status: AC
  Administered 2014-07-05: 30 meq via ORAL

## 2014-07-05 MED ORDER — AMIODARONE HCL 200 MG PO TABS
200.0000 mg | ORAL_TABLET | Freq: Two times a day (BID) | ORAL | Status: DC
  Administered 2014-07-05: 200 mg via ORAL
  Filled 2014-07-05 (×2): qty 1

## 2014-07-05 MED ORDER — AMIODARONE HCL 200 MG PO TABS
200.0000 mg | ORAL_TABLET | Freq: Every day | ORAL | Status: DC
  Administered 2014-07-06 – 2014-07-10 (×5): 200 mg via ORAL
  Filled 2014-07-05 (×5): qty 1

## 2014-07-05 MED ORDER — PANTOPRAZOLE SODIUM 40 MG IV SOLR
40.0000 mg | INTRAVENOUS | Status: DC
  Administered 2014-07-05 – 2014-07-09 (×5): 40 mg via INTRAVENOUS
  Filled 2014-07-05 (×7): qty 40

## 2014-07-05 MED ORDER — SORBITOL 70 % SOLN
30.0000 mL | Freq: Every day | Status: DC | PRN
  Administered 2014-07-05: 30 mL via ORAL
  Filled 2014-07-05: qty 30

## 2014-07-05 MED ORDER — WARFARIN SODIUM 4 MG PO TABS: 4.0000 mg | ORAL_TABLET | Freq: Once | ORAL | Status: DC

## 2014-07-05 MED ORDER — OXYCODONE HCL 5 MG PO TABS: 5.0000 mg | ORAL_TABLET | ORAL | Status: DC | PRN

## 2014-07-05 MED ORDER — MAGNESIUM OXIDE 400 MG PO TABS
400.0000 mg | ORAL_TABLET | Freq: Two times a day (BID) | ORAL | Status: DC
  Administered 2014-07-05 – 2014-07-10 (×11): 400 mg via ORAL
  Filled 2014-07-05 (×12): qty 1

## 2014-07-05 MED ORDER — BISACODYL 5 MG PO TBEC
5.0000 mg | DELAYED_RELEASE_TABLET | Freq: Every day | ORAL | Status: DC | PRN
  Filled 2014-07-05: qty 1

## 2014-07-05 MED ORDER — ACETAMINOPHEN 650 MG RE SUPP: 650.0000 mg | Freq: Four times a day (QID) | RECTAL | Status: DC | PRN

## 2014-07-05 MED ORDER — TRAMADOL HCL 50 MG PO TABS: 50.0000 mg | ORAL_TABLET | Freq: Two times a day (BID) | ORAL | Status: DC | PRN

## 2014-07-05 MED ORDER — ONDANSETRON HCL 4 MG PO TABS: 4.0000 mg | ORAL_TABLET | Freq: Four times a day (QID) | ORAL | Status: DC | PRN

## 2014-07-05 MED ORDER — BISACODYL 10 MG RE SUPP: 10.0000 mg | Freq: Every day | RECTAL | Status: DC | PRN

## 2014-07-05 MED ORDER — WARFARIN SODIUM 4 MG PO TABS: 4.0000 mg | ORAL_TABLET | ORAL | Status: DC

## 2014-07-05 MED ORDER — MINERAL OIL RE ENEM
1.0000 | ENEMA | Freq: Once | RECTAL | Status: AC
  Administered 2014-07-05: 1 via RECTAL
  Filled 2014-07-05: qty 1

## 2014-07-05 MED ORDER — WARFARIN - PHARMACIST DOSING INPATIENT
Freq: Every day | Status: DC
  Administered 2014-07-07 – 2014-07-08 (×2)

## 2014-07-05 MED ORDER — ALUM & MAG HYDROXIDE-SIMETH 200-200-20 MG/5ML PO SUSP: 30.0000 mL | Freq: Four times a day (QID) | ORAL | Status: DC | PRN

## 2014-07-05 MED ORDER — HYDROMORPHONE HCL 1 MG/ML IJ SOLN: 0.5000 mg | INTRAMUSCULAR | Status: DC | PRN

## 2014-07-05 MED ORDER — SODIUM CHLORIDE 0.9 % IJ SOLN
3.0000 mL | INTRAMUSCULAR | Status: DC | PRN
  Filled 2014-07-05: qty 3

## 2014-07-05 MED ORDER — MILRINONE IN DEXTROSE 20 MG/100ML IV SOLN
0.5000 ug/kg/min | INTRAVENOUS | Status: DC
  Administered 2014-07-05 – 2014-07-07 (×5): 0.5 ug/kg/min via INTRAVENOUS
  Filled 2014-07-05 (×4): qty 100

## 2014-07-05 MED ORDER — WARFARIN SODIUM 4 MG PO TABS
4.0000 mg | ORAL_TABLET | Freq: Once | ORAL | Status: AC
  Administered 2014-07-05: 4 mg via ORAL
  Filled 2014-07-05: qty 1

## 2014-07-05 MED ORDER — SILDENAFIL CITRATE 20 MG PO TABS
20.0000 mg | ORAL_TABLET | Freq: Three times a day (TID) | ORAL | Status: DC
  Administered 2014-07-05 – 2014-07-10 (×16): 20 mg via ORAL
  Filled 2014-07-05 (×18): qty 1

## 2014-07-05 MED ORDER — METOLAZONE 5 MG PO TABS
5.0000 mg | ORAL_TABLET | Freq: Every day | ORAL | Status: DC
  Administered 2014-07-05 – 2014-07-07 (×3): 5 mg via ORAL
  Filled 2014-07-05 (×4): qty 1

## 2014-07-05 MED ORDER — SODIUM CHLORIDE 0.9 % IV SOLN: 250.0000 mL | INTRAVENOUS | Status: DC | PRN

## 2014-07-05 MED ORDER — MILRINONE IN DEXTROSE 20 MG/100ML IV SOLN
0.2500 ug/kg/min | INTRAVENOUS | Status: DC
  Filled 2014-07-05: qty 100

## 2014-07-05 MED ORDER — MILK AND MOLASSES ENEMA
1.0000 | Freq: Every day | RECTAL | Status: DC | PRN
  Filled 2014-07-05: qty 250

## 2014-07-05 MED ORDER — ASPIRIN EC 325 MG PO TBEC
325.0000 mg | DELAYED_RELEASE_TABLET | Freq: Every day | ORAL | Status: DC
  Administered 2014-07-05 – 2014-07-10 (×6): 325 mg via ORAL
  Filled 2014-07-05 (×6): qty 1

## 2014-07-05 MED ORDER — SENNA 8.6 MG PO TABS
2.0000 | ORAL_TABLET | Freq: Two times a day (BID) | ORAL | Status: DC
  Administered 2014-07-05 – 2014-07-10 (×11): 17.2 mg via ORAL
  Filled 2014-07-05 (×12): qty 2

## 2014-07-05 MED ORDER — TORSEMIDE 20 MG PO TABS
80.0000 mg | ORAL_TABLET | Freq: Every day | ORAL | Status: DC
  Administered 2014-07-05 – 2014-07-07 (×3): 80 mg via ORAL
  Filled 2014-07-05 (×4): qty 4

## 2014-07-05 MED ORDER — SODIUM CHLORIDE 0.9 % IJ SOLN
3.0000 mL | Freq: Two times a day (BID) | INTRAMUSCULAR | Status: DC
  Administered 2014-07-05 – 2014-07-06 (×3): 3 mL via INTRAVENOUS
  Administered 2014-07-07: 10 mL via INTRAVENOUS
  Administered 2014-07-08 – 2014-07-09 (×2): 3 mL via INTRAVENOUS
  Administered 2014-07-09: 10 mL via INTRAVENOUS

## 2014-07-05 MED ORDER — POTASSIUM CHLORIDE CRYS ER 20 MEQ PO TBCR
30.0000 meq | EXTENDED_RELEASE_TABLET | Freq: Once | ORAL | Status: AC
  Administered 2014-07-05: 30 meq via ORAL
  Filled 2014-07-05 (×2): qty 1

## 2014-07-05 MED ORDER — MILRINONE IN DEXTROSE 20 MG/100ML IV SOLN: 0.5000 ug/kg/min | INTRAVENOUS | Status: DC

## 2014-07-05 MED ORDER — WARFARIN SODIUM 2 MG PO TABS
2.0000 mg | ORAL_TABLET | ORAL | Status: DC
  Filled 2014-07-05: qty 1

## 2014-07-05 MED ORDER — ACETAMINOPHEN 325 MG PO TABS: 650.0000 mg | ORAL_TABLET | Freq: Four times a day (QID) | ORAL | Status: DC | PRN

## 2014-07-05 MED ORDER — ONDANSETRON HCL 4 MG/2ML IJ SOLN: 4.0000 mg | Freq: Four times a day (QID) | INTRAMUSCULAR | Status: DC | PRN

## 2014-07-05 MED ORDER — DOCUSATE SODIUM 100 MG PO CAPS
100.0000 mg | ORAL_CAPSULE | Freq: Two times a day (BID) | ORAL | Status: DC
  Administered 2014-07-05 – 2014-07-10 (×11): 100 mg via ORAL
  Filled 2014-07-05 (×13): qty 1

## 2014-07-05 MED ORDER — SPIRONOLACTONE 25 MG PO TABS
25.0000 mg | ORAL_TABLET | Freq: Two times a day (BID) | ORAL | Status: DC
  Administered 2014-07-05 – 2014-07-10 (×11): 25 mg via ORAL
  Filled 2014-07-05 (×14): qty 1

## 2014-07-05 NOTE — ED Notes (Signed)
Per Larene Beach, Flow Mgr, pt is in for bed request.

## 2014-07-05 NOTE — Progress Notes (Addendum)
PROGRESS NOTE    Tyler Christensen OIZ:[id number] DOB: 04-23-1933 DOA: 07/04/2014 PCP: Harvie Junior, MD  HPI/Brief narrative 81 year old Spanish-speaking male patient with PMHx significant for chronic systolic CHF (EF 33%)-AS milrinone drip, NICM, LBBB, PAF (on coumadin), HTN, HLD, CVA, severe Pulmonary Hypertension, h/o seizure d/o (2/2 CVA 05/2012), Staphylococcus bacteremia treated with IV antibiotics in May 2015, TEE negative for endocarditis, retroperitoneal mass and biopsy revealed benign lesion, stage III chronic kidney disease , chronic constipation, chronic respiratory failure on home oxygen & DNR, presented with abdominal pain and distention of 5-6 days duration, constipation, nausea and intermittent bilious/nonbloody emesis, chronic unchanged dyspnea and no chest pain. CT abdomen revealed no signs of obstruction but showed enlarging retroperitoneal mass and increased stool in the vault. No cough or fever reported. Cardiology consulted.   Assessment/Plan:  1. Abdominal pain: Likely secondary to constipation and less likely secondary to enlarging retroperitoneal mass. Status post enema 1 in the ED and patient had small hard stool and flatus. Pain better. Aggressive bowel regimen. 2. Chronic systolic CHF/NICM/severe pulmonary hypertension/minimally elevated troponin: Seems volume overloaded. Requested cardiology for formal consultation to manage complex cardiac issues. Continue amiodarone, metolazone, Sildinafil, Aldactone, torsemide and milrinone. Elevated troponin likely secondary to demand ischemia. 3. Hypokalemia: Replace and follow BMP. 4. Stage III chronic kidney disease: Creatinine at baseline. 5. Chronic anemia: Stable. 6. PAF: Currently in sinus rhythm. Anticoagulated with Coumadin-continue per pharmacy. 7. History of CVA: Anticoagulated with Coumadin. 8. Nausea and vomiting: Probably related to problem #1. Treatment for constipation. When necessary antiemetics. Full liquid diet  and advance diet as tolerated. 9. Essential hypertension: Soft blood pressures. Monitor closely. 10. History of Staphylococcus aureus bacteremia: Treated 11. Enlarging right-sided retroperitoneal mass: As per previous records, biopsy was benign in the past.? Need for further workup i.e. repeat biopsy. Patient will not be a candidate for any aggressive surgical intervention.  12. DNR   Code Status: DO NOT RESUSCITATE - confirmed through interpreter. Family Communication: None at bedside. Disposition Plan: Home when medically stable.   Consultants:  Cardiology  Procedures:  None  Antibiotics:  None   Subjective: Patient was interviewed through telephone interpreter. Abdominal pain and distention feel slightly better after enema when he had small hard stools and flatus. Intermittent nausea and nonbloody emesis. Chronic unchanged dyspnea. No chest pain, cough or fever.  Objective: Filed Vitals:   07/05/14 0700 07/05/14 0730 07/05/14 0800 07/05/14 0814  BP: 111/60 115/60 104/57   Pulse:  83 77   Temp:      TempSrc:      Resp:   25   SpO2:  98% 98% 97%   temperature: 97.21F.  Intake/Output Summary (Last 24 hours) at 07/05/14 0837 Last data filed at 07/05/14 0359  Gross per 24 hour  Intake      0 ml  Output    250 ml  Net   -250 ml   There were no vitals filed for this visit.   Exam:  General exam: Elderly frail male patient sitting up on gurney in no obvious distress. Respiratory system: Diminished breath sounds in the bases with few bibasal fine crackles. Rest of lung fields clear to auscultation. No increased work of breathing. Cardiovascular system: S1 & S2 heard, RRR. No murmurs, gallops, clicks. JVD +. 1+ bilateral leg edema.. Gastrointestinal system: Abdomen is distended mildly, minimal tenderness without peritoneal signs, tympanitic. No organomegaly or masses appreciated. Normal bowel sounds heard. Central nervous system: Alert and oriented. No focal  neurological deficits. Extremities: Symmetric 5 x  5 power.   Data Reviewed: Basic Metabolic Panel:  Recent Labs Lab 07/04/14 1937 07/05/14 0506  NA 133*  --   K 3.0*  --   CL 93*  --   CO2 28  --   GLUCOSE 122*  --   BUN 38*  --   CREATININE 1.54*  --   CALCIUM 9.1  --   MG  --  1.9   Liver Function Tests:  Recent Labs Lab 07/04/14 1937  AST 71*  ALT 134*  ALKPHOS 167*  BILITOT 0.8  PROT 6.8  ALBUMIN 3.4*    Recent Labs Lab 07/04/14 1937  LIPASE 33   No results for input(s): AMMONIA in the last 168 hours. CBC:  Recent Labs Lab 07/04/14 1937  WBC 12.7*  NEUTROABS 10.8*  HGB 11.6*  HCT 34.0*  MCV 81.7  PLT 303   Cardiac Enzymes:  Recent Labs Lab 07/04/14 1959  TROPONINI 0.04*   BNP (last 3 results)  Recent Labs  03/01/14 1959 03/08/14 2230 06/05/14 0332  PROBNP 11880.0* 15334.0* 19533.0*   CBG: No results for input(s): GLUCAP in the last 168 hours.  No results found for this or any previous visit (from the past 240 hour(s)).       Studies: Ct Abdomen Pelvis W Contrast  07/05/2014   CLINICAL DATA:  Acute onset of generalized abdominal pain and constipation. Abdominal swelling. Initial encounter.  EXAM: CT ABDOMEN AND PELVIS WITH CONTRAST  TECHNIQUE: Multidetector CT imaging of the abdomen and pelvis was performed using the standard protocol following bolus administration of intravenous contrast.  CONTRAST:  80 mL of Omnipaque 300 IV contrast  COMPARISON:  CT of the abdomen and pelvis from 05/31/2014  FINDINGS: Mild bibasilar airspace opacities may reflect atelectasis or possibly mild pneumonia. Cardiomegaly is noted. Pacemaker/AICD leads are partially imaged. Diffuse coronary artery calcifications are seen.  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 It has increased in size from May of 2015, at which time it measured approximately 4.8 cm, though it  has changed only minimally from the recent prior study. It measures approximately 6.3 x 4.9 x 3.0 cm.  Numerous calcified granulomata are seen within the spleen. The liver is unremarkable in appearance. The gallbladder is grossly unremarkable, with minimally increased attenuation possibly reflecting vicarious excretion of contrast from a prior study. The pancreas and adrenal glands are grossly unremarkable.  Scattered bilateral renal cysts measure up to 5.3 cm in size. Nonspecific perinephric stranding and fluid are seen. The kidneys are otherwise grossly unremarkable. There is no evidence of hydronephrosis. No renal or ureteral stones are seen.  No free fluid is identified. The small bowel is unremarkable in appearance. The stomach is within normal limits. No acute vascular abnormalities are seen. Relatively diffuse calcification is seen along the abdominal aorta and its branches.  The appendix is normal in caliber, without evidence of appendicitis. The cecum is flipped anteriorly. Scattered diverticulosis is noted along the descending and proximal sigmoid colon. The sigmoid colon is somewhat redundant. The rectum is partially distended with stool, measuring 6.5 cm in transverse dimension.  The bladder is mildly distended and grossly unremarkable. The prostate is borderline enlarged, measuring 4.8 cm in transverse dimension. No inguinal lymphadenopathy is seen.  No acute osseous abnormalities are identified.  IMPRESSION: 1. Mild bibasilar airspace opacities may reflect atelectasis or possibly mild pneumonia. 2. 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 It has increased in size from 4.8 cm in May 2015, now measuring 6.3 x 4.9 x 3.0 cm. This is only minimally changed in size from the recent prior study. Would correlate clinically as to whether this has been formally diagnosed. 3. Scattered bilateral renal cysts noted. 4. Cardiomegaly noted.   Diffuse coronary artery calcifications seen. 5. Relatively diffuse calcification along the abdominal aorta and its branches. 6. Scattered diverticulosis along the descending and proximal sigmoid colon. Rectum partially distended with stool, measuring 6.5 cm in transverse dimension. 7. Borderline enlarged prostate.  These results were called by telephone at the time of interpretation on 07/05/2014 at 1:55 am to Dr. Christy Gentles, who verbally acknowledged these results.   Electronically Signed   By: Garald Balding M.D.   On: 07/05/2014 01:57   Dg Chest Portable 1 View  07/05/2014   CLINICAL DATA:  Port-A-Cath in place.  Vomiting for 4 days.  EXAM: PORTABLE CHEST - 1 VIEW  COMPARISON:  06/05/2014  FINDINGS: Tip of the right central line in the atrial caval junction. Multi lead left-sided pacemaker remains in place. Lower lung volumes from prior. Equivocal increase in cardiomegaly versus differences in technique. There is worsening vascular congestion and pulmonary edema. Questionable blunting of right costophrenic angle effusion. No pneumothorax.  IMPRESSION: 1. Tip of the right central line at the atrial caval junction. 2. Findings consistent with worsening CHF.   Electronically Signed   By: Jeb Levering M.D.   On: 07/05/2014 00:12        Scheduled Meds: . amiodarone  200 mg Oral BID  . aspirin EC  325 mg Oral Daily  . docusate sodium  100 mg Oral BID  . magnesium oxide  400 mg Oral BID  . metolazone  5 mg Oral Daily  . ondansetron (ZOFRAN) IV  4 mg Intravenous Once  . sildenafil  20 mg Oral TID  . sodium chloride  3 mL Intravenous Q12H  . spironolactone  25 mg Oral BID  . torsemide  80 mg Oral Daily  . warfarin  2 mg Oral Q T,Th,Sat-1800  . warfarin  4 mg Oral ONCE-1800  . [START ON 07/06/2014] warfarin  4 mg Oral Q M,W,F,Su-1800  . Warfarin - Pharmacist Dosing Inpatient   Does not apply q1800   Continuous Infusions: . sodium chloride    . milrinone    . milrinone    . milrinone       Active Problems:   Acute On Chronic systolic congestive heart failure, NYHA class 2 EF 20 % 12.23.2013   HTN (hypertension)   Atrial fibrillation   HLD (hyperlipidemia)   Long-term (current) use of anticoagulants   ICD (implantable cardioverter-defibrillator), biventricular, in situ   Retroperitoneal mass   Elevated troponin   Abdominal pain   Pulmonary HTN    Time spent: 40 minutes.    Vernell Leep, MD, FACP, FHM. [facility] Pager [phone]  If 7PM-7AM, please contact night-coverage [email or web address] Password [email or web address] 07/05/2014, 8:37 AM    LOS: 1 day

## 2014-07-05 NOTE — ED Notes (Signed)
MD Wickline at bedside. 

## 2014-07-05 NOTE — H&P (Signed)
[facility] Admission History and Physical       Tyler Christensen XBM:[id number] DOB: 02-Apr-1933 DOA: 07/04/2014  Referring physician: EDP PCP: Harvie Junior, MD  Specialists:   Chief Complaint: ABD Pain  HPI: Tyler Christensen is a 81 y.o. Spanish speaking only male with a history of Chronic Systolic CHF with EF =02% on a Milrinone drip, CAD, S/P Biventricular Pacer, Pulmonary HTN, and Paroxysmal Atrial Fibrillation on Coumadin rx, Hyperlipidemia, and previous CVA who presents to the ED with complaints of increased ABD pain with Distention x 5 -6 days. He reports having constipation x 6 days and nausea with vomiting.   He denies having any chest pain.   He reports having SOB.  He was evaluated in the ED and his labs revealed an elevated troponin and Cardiology was consulted, and a CT scan of the ABD revealed No signs of Obstruction, an known but enlarging Retroperitoneal Mass, and increased Stool in the vault, as well as signs of mild pneumonia.     The history was obtained through interview of the patient with the assistance of the Telephone Translation Line for Spanish, and the medical records and report from the EDPs.     Review of Systems:  Constitutional: No Weight Loss, No Weight Gain, Night Sweats, Fevers, Chills, Dizziness, Fatigue, +Generalized Weakness HEENT: No Headaches, Difficulty Swallowing,Tooth/Dental Problems,Sore Throat,  No Sneezing, Rhinitis, Ear Ache, Nasal Congestion, or Post Nasal Drip,  Cardio-vascular:  No Chest pain, Orthopnea, PND, Edema in Lower Extremities, Anasarca, Dizziness, Palpitations  Resp: No Dyspnea, No DOE, No Productive Cough, No Non-Productive Cough, No Hemoptysis, No Wheezing.    GI: No Heartburn, Indigestion, +Abdominal Pain, +Nausea, +Vomiting, Diarrhea,+Constipation,  Hematemesis, Hematochezia, Melena, Change in Bowel Habits, +Loss of Appetite  GU: No Dysuria, Change in Color of Urine, No Urgency or Frequency, No Flank pain.  Musculoskeletal:  No Joint Pain or Swelling, No Decreased Range of Motion, No Back Pain.  Neurologic: No Syncope, No Seizures, Muscle Weakness, Paresthesia, Vision Disturbance or Loss, No Diplopia, No Vertigo, No Difficulty Walking,  Skin: No Rash or Lesions. Psych: No Change in Mood or Affect, No Depression or Anxiety, No Memory loss, No Confusion, or Hallucinations   Past Medical History  Diagnosis Date  . HTN (hypertension)   . High cholesterol   . CHF (congestive heart failure)   . BBBB (bilateral bundle branch block)   . LBBB (left bundle branch block)   . Seizures 06/11/2012    new onset/notes (06/11/2012)  . SOB (shortness of breath)     "sometimes when I lay down;; related to not taking my RX" (06/11/2012)  . Myocardial infarction     06/10/2012  . Atrial fibrillation   . Stroke   . Atrial thrombus     left  . Pulmonary HTN   . Atrial fibrillation 06/12/2012     Past Surgical History  Procedure Laterality Date  . Throat surgery  1942    "and nose" (06/11/2012); ?T&A  . Inguinal hernia repair  ~ 2008    "both sides" (06/11/2012)  . Tee without cardioversion  06/29/2012    Procedure: TRANSESOPHAGEAL ECHOCARDIOGRAM (TEE);  Surgeon: Jolaine Artist, MD;  Location: [facility] ENDOSCOPY;  Service: Cardiovascular;  Laterality: N/A;  will need a spanish interpreter Interpreter will be here at 1330-Hope spoke w/ [place]  . Cardioversion N/A 08/14/2012    Procedure: CARDIOVERSION;  Surgeon: Jolaine Artist, MD;  Location: [facility] ENDOSCOPY;  Service: Cardiovascular;  Laterality: N/A;  . Tee without cardioversion N/A 11/12/2013  Procedure: TRANSESOPHAGEAL ECHOCARDIOGRAM (TEE);  Surgeon: Candee Furbish, MD;  Location: [facility] ENDOSCOPY;  Service: Cardiovascular;  Laterality: N/A;  . Tee without cardioversion N/A 11/19/2013    Procedure: TRANSESOPHAGEAL ECHOCARDIOGRAM (TEE);  Surgeon: Jolaine Artist, MD;  Location: [facility] ENDOSCOPY;  Service: Cardiovascular;  Laterality: N/A;  . Cardioversion N/A 12/27/2013     Procedure: CARDIOVERSION;  Surgeon: Jolaine Artist, MD;  Location: [facility] ENDOSCOPY;  Service: Cardiovascular;  Laterality: N/A;  . Left and right heart catheterization with coronary angiogram N/A 07/02/2012    Procedure: LEFT AND RIGHT HEART CATHETERIZATION WITH CORONARY ANGIOGRAM;  Surgeon: Jolaine Artist, MD;  Location: [facility] CATH LAB;  Service: Cardiovascular;  Laterality: N/A;  . Right heart catheterization N/A 07/06/2012    Procedure: RIGHT HEART CATH;  Surgeon: Jolaine Artist, MD;  Location: [facility] CATH LAB;  Service: Cardiovascular;  Laterality: N/A;  . Bi-ventricular implantable cardioverter defibrillator N/A 10/26/2012    Procedure: BI-VENTRICULAR IMPLANTABLE CARDIOVERTER DEFIBRILLATOR  (CRT-D);  Surgeon: Evans Lance, MD;  Location: [facility] CATH LAB;  Service: Cardiovascular;  Laterality: N/A;      Prior to Admission medications   Medication Sig Start Date End Date Taking? Authorizing Provider  amiodarone (PACERONE) 200 MG tablet TAKE 1 TABLET BY MOUTH TWICE DAILY 06/12/14  Yes Jolaine Artist, MD  bisacodyl (BISACODYL) 5 MG EC tablet Take 1 tablet (5 mg total) by mouth daily as needed for moderate constipation. 06/19/14  Yes Larey Dresser, MD  docusate sodium (COLACE) 100 MG capsule Take 1 capsule (100 mg total) by mouth 2 (two) times daily. 06/19/14  Yes Larey Dresser, MD  magnesium oxide (MAG-OX) 400 MG tablet Take 1 tablet (400 mg total) by mouth 2 (two) times daily. 04/25/14  Yes Jolaine Artist, MD  metolazone (ZAROXOLYN) 5 MG tablet Take 1 tablet (5 mg total) by mouth 2 (two) times a week. Every Sunday and Thursday Patient taking differently: Take 5 mg by mouth daily. Every Sunday and Thursday 04/21/14  Yes [name] D Clegg, NP  sildenafil (REVATIO) 20 MG tablet Take 1 tablet (20 mg total) by mouth 3 (three) times daily. 03/07/14  Yes Rande Brunt, NP  sorbitol 70 % SOLN Take 30 mLs by mouth daily as needed for moderate constipation. 04/10/14  Yes Rande Brunt, NP    spironolactone (ALDACTONE) 25 MG tablet Take 1 tablet (25 mg total) by mouth 2 (two) times daily. 06/25/14  Yes Jolaine Artist, MD  torsemide (DEMADEX) 20 MG tablet Take 4 tablets (80 mg total) by mouth daily. 04/25/14  Yes Jolaine Artist, MD  traMADol (ULTRAM) 50 MG tablet TAKE 1 TABLET BY MOUTH TWICE DAILY AS NEEDED FOR PAIN 06/02/14  Yes Jolaine Artist, MD  warfarin (COUMADIN) 4 MG tablet Take 4 mg Monday, Wednesday, Friday and Sunday. 2 mg on Tues, Thurs and Sat 06/10/14  Yes Rande Brunt, NP  milrinone [facility]) 20 MG/100ML SOLN infusion Inject 33.95 mcg/min into the vein continuous. 06/10/14   Rande Brunt, NP     Allergies  Allergen Reactions  . Lisinopril Rash     Social History:  reports that he has never smoked. He has never used smokeless tobacco. He reports that he does not drink alcohol or use illicit drugs.     Family History  Problem Relation Age of Onset  . Heart attack      father had MI in his 74s, no fhx of early heart problem before age 25  . Heart attack  mother died of MI around 76      Physical Exam:  GEN:  Pleasant Elderly ill Appearing Elderly  81 y.o. Hispanic male examined  and in Discomfort but no acute distress; cooperative with exam Filed Vitals:   07/05/14 0145 07/05/14 0200 07/05/14 0345 07/05/14 0350  BP: 117/53 111/60 101/48 106/59  Pulse: 84 81 76 82  Temp:      TempSrc:      Resp: 19 20  18   SpO2: 94% 92% 99% 98%   Blood pressure 106/59, pulse 82, temperature 97.9 F (36.6 C), temperature source Oral, resp. rate 18, SpO2 98 %. PSYCH: He is alert and oriented x4; does not appear anxious does not appear depressed; affect is normal HEENT: Normocephalic and Atraumatic, Mucous membranes pink; PERRLA; EOM intact; Fundi:  Benign;  No scleral icterus, Nares: Patent, Oropharynx: Clear,    Neck:  FROM, No Cervical Lymphadenopathy nor Thyromegaly or Carotid Bruit; No JVD; Breasts:: Not examined CHEST WALL: No tenderness CHEST:  Normal respiration, clear to auscultation bilaterally HEART: Regular rate and rhythm; no murmurs rubs or gallops BACK: No kyphosis or scoliosis; No CVA tenderness ABDOMEN: Positive but Decreased Bowel Sounds, Distended, Non-Tender on Palpation, Scaphoid, Obese, No Masses, No Organomegaly. Rectal Exam: Not done EXTREMITIES: No Cyanosis, Clubbing, or Edema; No Ulcerations. Genitalia: not examined PULSES: 2+ and symmetric SKIN: Normal hydration no rash or ulceration CNS:  Alert and Oriented x 4, No Focal Deficits.  Vascular: pulses palpable throughout    Labs on Admission:  Basic Metabolic Panel:  Recent Labs Lab 07/04/14 1937  NA 133*  K 3.0*  CL 93*  CO2 28  GLUCOSE 122*  BUN 38*  CREATININE 1.54*  CALCIUM 9.1   Liver Function Tests:  Recent Labs Lab 07/04/14 1937  AST 71*  ALT 134*  ALKPHOS 167*  BILITOT 0.8  PROT 6.8  ALBUMIN 3.4*    Recent Labs Lab 07/04/14 1937  LIPASE 33   No results for input(s): AMMONIA in the last 168 hours. CBC:  Recent Labs Lab 07/04/14 1937  WBC 12.7*  NEUTROABS 10.8*  HGB 11.6*  HCT 34.0*  MCV 81.7  PLT 303   Cardiac Enzymes:  Recent Labs Lab 07/04/14 1959  TROPONINI 0.04*    BNP (last 3 results)  Recent Labs  03/01/14 1959 03/08/14 2230 06/05/14 0332  PROBNP 11880.0* 15334.0* 19533.0*   CBG: No results for input(s): GLUCAP in the last 168 hours.  Radiological Exams on Admission: Ct Abdomen Pelvis W Contrast  07/05/2014   CLINICAL DATA:  Acute onset of generalized abdominal pain and constipation. Abdominal swelling. Initial encounter.  EXAM: CT ABDOMEN AND PELVIS WITH CONTRAST  TECHNIQUE: Multidetector CT imaging of the abdomen and pelvis was performed using the standard protocol following bolus administration of intravenous contrast.  CONTRAST:  80 mL of Omnipaque 300 IV contrast  COMPARISON:  CT of the abdomen and pelvis from 05/31/2014  FINDINGS: Mild bibasilar airspace opacities may reflect atelectasis or  possibly mild pneumonia. Cardiomegaly is noted. Pacemaker/AICD leads are partially imaged. Diffuse coronary artery calcifications are seen.  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 It has increased in size from May of 2015, at which time it measured approximately 4.8 cm, though it has changed only minimally from the recent prior study. It measures approximately 6.3 x 4.9 x 3.0 cm.  Numerous calcified granulomata are seen within the spleen. The liver is unremarkable in appearance. The gallbladder  is grossly unremarkable, with minimally increased attenuation possibly reflecting vicarious excretion of contrast from a prior study. The pancreas and adrenal glands are grossly unremarkable.  Scattered bilateral renal cysts measure up to 5.3 cm in size. Nonspecific perinephric stranding and fluid are seen. The kidneys are otherwise grossly unremarkable. There is no evidence of hydronephrosis. No renal or ureteral stones are seen.  No free fluid is identified. The small bowel is unremarkable in appearance. The stomach is within normal limits. No acute vascular abnormalities are seen. Relatively diffuse calcification is seen along the abdominal aorta and its branches.  The appendix is normal in caliber, without evidence of appendicitis. The cecum is flipped anteriorly. Scattered diverticulosis is noted along the descending and proximal sigmoid colon. The sigmoid colon is somewhat redundant. The rectum is partially distended with stool, measuring 6.5 cm in transverse dimension.  The bladder is mildly distended and grossly unremarkable. The prostate is borderline enlarged, measuring 4.8 cm in transverse dimension. No inguinal lymphadenopathy is seen.  No acute osseous abnormalities are identified.  IMPRESSION: 1. Mild bibasilar airspace opacities may reflect atelectasis or possibly mild pneumonia. 2. 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 It has increased in size from 4.8 cm in May 2015, now measuring 6.3 x 4.9 x 3.0 cm. This is only minimally changed in size from the recent prior study. Would correlate clinically as to whether this has been formally diagnosed. 3. Scattered bilateral renal cysts noted. 4. Cardiomegaly noted.  Diffuse coronary artery calcifications seen. 5. Relatively diffuse calcification along the abdominal aorta and its branches. 6. Scattered diverticulosis along the descending and proximal sigmoid colon. Rectum partially distended with stool, measuring 6.5 cm in transverse dimension. 7. Borderline enlarged prostate.  These results were called by telephone at the time of interpretation on 07/05/2014 at 1:55 am to Dr. Christy Gentles, who verbally acknowledged these results.   Electronically Signed   By: Garald Balding M.D.   On: 07/05/2014 01:57   Dg Chest Portable 1 View  07/05/2014   CLINICAL DATA:  Port-A-Cath in place.  Vomiting for 4 days.  EXAM: PORTABLE CHEST - 1 VIEW  COMPARISON:  06/05/2014  FINDINGS: Tip of the right central line in the atrial caval junction. Multi lead left-sided pacemaker remains in place. Lower lung volumes from prior. Equivocal increase in cardiomegaly versus differences in technique. There is worsening vascular congestion and pulmonary edema. Questionable blunting of right costophrenic angle effusion. No pneumothorax.  IMPRESSION: 1. Tip of the right central line at the atrial caval junction. 2. Findings consistent with worsening CHF.   Electronically Signed   By: Jeb Levering M.D.   On: 07/05/2014 00:12     EKG: Independently reviewed. Biventricular Pacing,  LBBB, rate 89   Assessment/Plan:   81 y.o. male with   Active Problems:   1.    Elevated troponin   Cardiac Monitoring   Cycle Troponins   Cards saw in ED and will Follow     2.    Abdominal pain- Non-Obstructive on CT scan, most Likely due  to constipation:  Increased Stool on CT scan, and Known Retroperitoneal Mass   Laxitive Rx of Lactulose x1 oral and Mineral Oil Fleets Enema x1   Start Miralax BID    Monitor for reults and Improvement or Worsening         3.   Chronic systolic congestive heart failure, NYHA class 2 EF 20 % 12.23.2013/ ICD (implantable cardioverter-defibrillator), biventricular, in  situ   Continue Milrinone Drip, Demadex, and Zaroxylyn RX   Monitor I/Os and weights   Notify CHF Team in AM     4.   HTN (hypertension)   Continue Demadex, Zaroxyln      5.   Paroxsymal Atrial fibrillation-   Monitor Rhythm   Continue Amiodarone Rx   Continue Coumadin Rx    Pharmacy adjustement PRN       6.   HLD (hyperlipidemia)   Continue      7.   Long-term (current) use of anticoagulants   Continue Coumadin Rx    Pharmacy Adjustment PRN     8.   Retroperitoneal mass   Seen on CT scan as early as 10/2013     9.   Pulmonary HTN   Continue Revatio TID    10.  DVT Prophylaxis   On Coumadin    Code Status:     FULL CODE Family Communication:    No Family at Bedside Disposition Plan:       Observation Stepdown Unit  Time spent:  [place] [facility] Pager [phone]   If [place] Please Contact the Day Rounding Team MD for [facility]  If 7PM-7AM, Please Contact Night-Floor Coverage  [email or web address] Password [facility] 07/05/2014, 4:27 AM

## 2014-07-05 NOTE — ED Notes (Signed)
Jenkins, MD at bedside.  

## 2014-07-05 NOTE — ED Notes (Signed)
PT HAD BM IN [facility]

## 2014-07-05 NOTE — ED Notes (Signed)
Pt assisted to the bedside commode

## 2014-07-05 NOTE — Progress Notes (Signed)
ANTICOAGULATION CONSULT NOTE - Initial Consult  Pharmacy Consult for Coumadin Indication: atrial fibrillation  Allergies  Allergen Reactions  . Lisinopril Rash     Vital Signs: Temp: 97.9 F (36.6 C) (01/15 1909) Temp Source: Oral (01/15 1909) BP: 106/59 mmHg (01/16 0350) Pulse Rate: 82 (01/16 0350)  Labs:  Recent Labs  07/04/14 1937 07/04/14 1959  HGB 11.6*  --   HCT 34.0*  --   PLT 303  --   LABPROT  --  23.6*  INR  --  2.09*  CREATININE 1.54*  --   TROPONINI  --  0.04*     Medical History: Past Medical History  Diagnosis Date  . HTN (hypertension)   . High cholesterol   . CHF (congestive heart failure)   . BBBB (bilateral bundle branch block)   . LBBB (left bundle branch block)   . Seizures 06/11/2012    new onset/notes (06/11/2012)  . SOB (shortness of breath)     "sometimes when I lay down;; related to not taking my RX" (06/11/2012)  . Myocardial infarction     06/10/2012  . Atrial fibrillation   . Stroke   . Atrial thrombus     left  . Pulmonary HTN   . Atrial fibrillation 06/12/2012      Assessment: 81yo male known to be medically noncompliant c/o abdominal pain and constipation x5d, to be admitted for observation, to continue Coumadin for Afib (current INR at goal); also to continue milrinone for CHF.  Goal of Therapy:  INR 2-3   Plan:  Will continue home Coumadin dosing of 4mg  MWFSun and 2mg  TTSat and monitor INR for dose adjustments; will continue home milrinone dose of 33.95 mcg/min.  Wynona Neat, PharmD, BCPS  07/05/2014,4:21 AM

## 2014-07-05 NOTE — Consult Note (Signed)
Primary cardiologist: Dr Haroldine Laws Consulting cardiologist: Dr Carlyle Dolly  Clinical Summary Tyler Christensen is a 81 y.o.male spanish speaking male with history of chronic systolic HF LVEF 79% on home milrinone, NICM, PAF on coumadin, HTN, HL, CVA, pulm HTN on revatio, DNR status, and seizure disorder admitted with progressing abdominal pain and constipation. Reports recent nausea and vomiting. No chest pain. Stable SOB.   - has not been on beta blocker due to low output CHF. Allergy to ACE-I, has not been on ARB or aldactone due to poor renal function.  From last heart failure note 06/19/14 noted to have some abdominal discomfort at that time and constipation.   02/2014 Echo LVEF 20-25%, restrictive diastolic dysfunction, mod MR, PASP 76, RV mildly dilated with normal function CXR cardiomegaly, worsening vaculcar congestion.  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 Scattered diverticulosis, rectum distended with stool measuing 6.5 cm.  Lipase 33, Na 133, K 3.0, Cl 93, Cr 1.54 (stable), AST 71, ALT 134, Alk phos 167 (liver enzymes trending up) Hgb 11.6, Plt 303, trop 0.04, Mg, INR 2.18, Mg 1.9 EKG A sensed V paced  RHC off milrinone 02/21/13: CI 1.5, PWCP 20, mean PA 44  Allergies  Allergen Reactions  . Lisinopril Rash    Medications Scheduled Medications: . amiodarone  200 mg Oral BID  . aspirin EC  325 mg Oral Daily  . docusate sodium  100 mg Oral BID  . magnesium oxide  400 mg Oral BID  . metolazone  5 mg Oral Daily  . pantoprazole (PROTONIX) IV  40 mg Intravenous Q24H  . potassium chloride  30 mEq Oral Q4H  . senna  2 tablet Oral BID  . sildenafil  20 mg Oral TID  . sodium chloride  3 mL Intravenous Q12H  . spironolactone  25 mg Oral BID  . torsemide  80 mg Oral Daily  . warfarin  4 mg Oral ONCE-1800  . Warfarin - Pharmacist Dosing Inpatient   Does not apply q1800      Infusions: . sodium chloride    . milrinone       PRN Medications:  sodium chloride, acetaminophen **OR** acetaminophen, alum & mag hydroxide-simeth, bisacodyl, HYDROmorphone (DILAUDID) injection, milk and molasses, ondansetron **OR** ondansetron (ZOFRAN) IV, oxyCODONE, sodium chloride, sorbitol   Past Medical History  Diagnosis Date  . HTN (hypertension)   . High cholesterol   . CHF (congestive heart failure)   . BBBB (bilateral bundle Inmer Nix block)   . LBBB (left bundle Tyler Christensen block)   . Seizures 06/11/2012    new onset/notes (06/11/2012)  . SOB (shortness of breath)     "sometimes when I lay down;; related to not taking my RX" (06/11/2012)  . Myocardial infarction     06/10/2012  . Atrial fibrillation   . Stroke   . Atrial thrombus     left  . Pulmonary HTN   . Atrial fibrillation 06/12/2012    Past Surgical History  Procedure Laterality Date  . Throat surgery  1942    "and nose" (06/11/2012); ?T&A  . Inguinal hernia repair  ~ 2008    "both sides" (06/11/2012)  . Tee without cardioversion  06/29/2012    Procedure: TRANSESOPHAGEAL ECHOCARDIOGRAM (TEE);  Surgeon: Jolaine Artist, MD;  Location: [facility] ENDOSCOPY;  Service: Cardiovascular;  Laterality: N/A;  will need a spanish interpreter Interpreter will be here at 1330-Tyler Christensen  . Cardioversion  N/A 08/14/2012    Procedure: CARDIOVERSION;  Surgeon: Daniel R Bensimhon, MD;  Location: [facility] ENDOSCOPY;  Service: Cardiovascular;  Laterality: N/A;  . Tee without cardioversion N/A 11/12/2013    Procedure: TRANSESOPHAGEAL ECHOCARDIOGRAM (TEE);  Surgeon: Mark Skains, MD;  Location: [facility] ENDOSCOPY;  Service: Cardiovascular;  Laterality: N/A;  . Tee without cardioversion N/A 11/19/2013    Procedure: TRANSESOPHAGEAL ECHOCARDIOGRAM (TEE);  Surgeon: Daniel R Bensimhon, MD;  Location: [facility] ENDOSCOPY;  Service: Cardiovascular;  Laterality: N/A;  . Cardioversion N/A 12/27/2013    Procedure: CARDIOVERSION;  Surgeon: Daniel R  Bensimhon, MD;  Location: [facility] ENDOSCOPY;  Service: Cardiovascular;  Laterality: N/A;  . Left and right heart catheterization with coronary angiogram N/A 07/02/2012    Procedure: LEFT AND RIGHT HEART CATHETERIZATION WITH CORONARY ANGIOGRAM;  Surgeon: Daniel R Bensimhon, MD;  Location: [facility] CATH LAB;  Service: Cardiovascular;  Laterality: N/A;  . Right heart catheterization N/A 07/06/2012    Procedure: RIGHT HEART CATH;  Surgeon: Daniel R Bensimhon, MD;  Location: [facility] CATH LAB;  Service: Cardiovascular;  Laterality: N/A;  . Bi-ventricular implantable cardioverter defibrillator N/A 10/26/2012    Procedure: BI-VENTRICULAR IMPLANTABLE CARDIOVERTER DEFIBRILLATOR  (CRT-D);  Surgeon: Gregg W Taylor, MD;  Location: [facility] CATH LAB;  Service: Cardiovascular;  Laterality: N/A;    Family History  Problem Relation Age of Onset  . Heart attack      father had MI in his 70s, no fhx of early heart problem before age 60  . Heart attack      mother died of MI around 2000    Social History Mr. Moser reports that he has never smoked. He has never used smokeless tobacco. Mr. Bluemel reports that he does not drink alcohol.  Review of Systems CONSTITUTIONAL: No weight loss, fever, chills, weakness or fatigue.  HEENT: Eyes: No visual loss, blurred vision, double vision or yellow sclerae. No hearing loss, sneezing, congestion, runny nose or sore throat.  SKIN: No rash or itching.  CARDIOVASCULAR: No chest pain, chest pressure or chest discomfort. No palpitations or edema.  RESPIRATORY: No shortness of breath, cough or sputum.  GASTROINTESTINAL: per HPI GENITOURINARY: no polyuria, no dysuria NEUROLOGICAL: No headache, dizziness, syncope, paralysis, ataxia, numbness or tingling in the extremities. No change in bowel or bladder control.  MUSCULOSKELETAL: No muscle, back pain, joint pain or stiffness.  HEMATOLOGIC: No anemia, bleeding or bruising.  LYMPHATICS: No enlarged nodes. No history of splenectomy.  PSYCHIATRIC: No  history of depression or anxiety.      Physical Examination Blood pressure 137/58, pulse 84, temperature 97.9 F (36.6 C), temperature source Oral, resp. rate 24, weight 152 lb 12.5 oz (69.3 kg), SpO2 91 %.  Intake/Output Summary (Last 24 hours) at 07/05/14 1439 Last data filed at 07/05/14 1131  Gross per 24 hour  Intake      3 ml  Output    250 ml  Net   -247 ml    HEENT: sclera clear  Cardiovascular: RRR, no m/r/g, no JVD  Respiratory: CTAB  GI: abdomen distended, NT  MSK: 1+ bilateral LE edema  Neuro: no focal deficits  Psych: appropriate affect   Lab Results  Basic Metabolic Panel:  Recent Labs Lab 07/04/14 1937 07/05/14 0506 07/05/14 0836  NA 133*  --  130*  K 3.0*  --  2.8*  CL 93*  --  90*  CO2 28  --  33*  GLUCOSE 122*  --  165*  BUN 38*  --  38*  CREATININE 1.54*  --    1.61*  CALCIUM 9.1  --  9.2  MG  --  1.9  --     Liver Function Tests:  Recent Labs Lab 07/04/14 1937  AST 71*  ALT 134*  ALKPHOS 167*  BILITOT 0.8  PROT 6.8  ALBUMIN 3.4*    CBC:  Recent Labs Lab 07/04/14 1937 07/05/14 0836  WBC 12.7* 14.3*  NEUTROABS 10.8*  --   HGB 11.6* 11.6*  HCT 34.0* 34.7*  MCV 81.7 83.2  PLT 303 306    Cardiac Enzymes:  Recent Labs Lab 07/04/14 1959  TROPONINI 0.04*    BNP: Invalid input(s): POCBNP   Impression/Recommendations 1. Abdominial pain - noted in prior CHF clinic notes, appears to have progressed since New Years even visit. CT scan with some evidence of constipation that may be cause. He also has low output CHF on milrinone, noted liver enzymes trending up as well as hyponatremia unclear if congestive gastropathy/hepatopathy could also be playing a role. Follow symptoms as constipation is treated.  - continue milrinone at 0.5, we will ask CHF to take a look at patient and help decide. Will repeat echo to look for progression of dysfunction.   2. Chronic systolic HF - limited medical therapy as described above,  continue home milrinone. - repeat echo. Will obtain Co-ox off picc line per CHF request - does not appear significantly volume overloaded, mild LE edema described in prior notes as well, likely chronic.   3. Pulmonary HTN - continue revatio  4. Constipation - per primary team  5. Transaminitis - possible related to congestion from CHF, he is also on amiodarone. - will decrease amio to 253m daily for now. He is followed closely by CHF clinic, will ask there thoughts.  6. Elevated troponin - just above detection level, non-specific in setting of his CHF. EKG is V-paced.Cycle enzymes.   7. Afib - continue current meds  8. Retroperitoneal mass - noted on prior CT's, appears stable in size.   JCarlyle Dolly M.D

## 2014-07-05 NOTE — Progress Notes (Signed)
Utilization Review completed.  

## 2014-07-05 NOTE — ED Notes (Signed)
MD Mingo Amber called CT.

## 2014-07-05 NOTE — Progress Notes (Signed)
[facility]  Patient Status: Active (receiving services up to time of hospitalization)  [facility] is providing the following services: RN and Home Infusion Services (teaching and education will be done by nurse in the home with patient and caregiver)  If patient discharges after hours, please call [phone].   Tyler Christensen 07/05/2014, 10:42 AM

## 2014-07-05 NOTE — ED Provider Notes (Signed)
Pt stable CT findings noted Will admit D/w dr Hiram Comber jenkins BP 111/60 mmHg  Pulse 81  Temp(Src) 97.9 F (36.6 C) (Oral)  Resp 20  SpO2 92%   Sharyon Cable, MD 07/05/14 [phone]

## 2014-07-05 NOTE — ED Notes (Signed)
Called cardiology to discuss pt milrinone dosage

## 2014-07-05 NOTE — ED Notes (Signed)
hospitalist notified of pt abnormal labs. He advises that he will review and address the abnormalities

## 2014-07-06 DIAGNOSIS — I5023 Acute on chronic systolic (congestive) heart failure: Secondary | ICD-10-CM

## 2014-07-06 DIAGNOSIS — I482 Chronic atrial fibrillation: Secondary | ICD-10-CM

## 2014-07-06 DIAGNOSIS — R1084 Generalized abdominal pain: Secondary | ICD-10-CM

## 2014-07-06 LAB — BASIC METABOLIC PANEL
Anion gap: 9 (ref 5–15)
BUN: 42 mg/dL — AB (ref 6–23)
CHLORIDE: 91 meq/L — AB (ref 96–112)
CO2: 29 mmol/L (ref 19–32)
Calcium: 8.9 mg/dL (ref 8.4–10.5)
Creatinine, Ser: 1.63 mg/dL — ABNORMAL HIGH (ref 0.50–1.35)
GFR, EST AFRICAN AMERICAN: 45 mL/min — AB (ref >90)
GFR, EST NON AFRICAN AMERICAN: 38 mL/min — AB (ref >90)
GLUCOSE: 144 mg/dL — AB (ref 70–99)
Potassium: 3.3 mmol/L — ABNORMAL LOW (ref 3.5–5.1)
Sodium: 129 mmol/L — ABNORMAL LOW (ref 135–145)

## 2014-07-06 LAB — PROTIME-INR
INR: 2.55 — ABNORMAL HIGH (ref 0.00–1.49)
PROTHROMBIN TIME: 27.6 s — AB (ref 11.6–15.2)

## 2014-07-06 LAB — TSH: TSH: 10.167 u[IU]/mL — ABNORMAL HIGH (ref 0.350–4.500)

## 2014-07-06 MED ORDER — SORBITOL 70 % PO SOLN
30.0000 mL | Freq: Once | ORAL | Status: AC
  Administered 2014-07-06: 30 mL via ORAL
  Filled 2014-07-06: qty 30

## 2014-07-06 MED ORDER — WARFARIN SODIUM 2 MG PO TABS
2.0000 mg | ORAL_TABLET | Freq: Once | ORAL | Status: AC
  Administered 2014-07-06: 2 mg via ORAL
  Filled 2014-07-06: qty 1

## 2014-07-06 MED ORDER — MILK AND MOLASSES ENEMA: 1.0000 | Freq: Every day | RECTAL | Status: DC | PRN

## 2014-07-06 MED ORDER — POTASSIUM CHLORIDE CRYS ER 20 MEQ PO TBCR
40.0000 meq | EXTENDED_RELEASE_TABLET | Freq: Once | ORAL | Status: AC
  Administered 2014-07-06: 40 meq via ORAL
  Filled 2014-07-06: qty 2

## 2014-07-06 MED ORDER — POLYETHYLENE GLYCOL 3350 17 G PO PACK
17.0000 g | PACK | Freq: Two times a day (BID) | ORAL | Status: DC
  Administered 2014-07-06 – 2014-07-10 (×9): 17 g via ORAL
  Filled 2014-07-06 (×10): qty 1

## 2014-07-06 NOTE — Progress Notes (Signed)
ANTICOAGULATION CONSULT NOTE - Follow Up Consult  Pharmacy Consult for Coumadin Indication: atrial fibrillation  Allergies  Allergen Reactions  . Lisinopril Rash    Patient Measurements: Height: 5\' 8"  (172.7 cm) Weight: 148 lb 11.2 oz (67.45 kg) IBW/kg (Calculated) : 68.4  Vital Signs: Temp: 97.9 F (36.6 C) (01/17 0800) Temp Source: Oral (01/17 0800) BP: 107/50 mmHg (01/17 0700) Pulse Rate: 80 (01/17 0700)  Labs:  Recent Labs  07/04/14 1937 07/04/14 1959 07/05/14 0506 07/05/14 0836 07/06/14 0423  HGB 11.6*  --   --  11.6*  --   HCT 34.0*  --   --  34.7*  --   PLT 303  --   --  306  --   LABPROT  --  23.6* 24.4*  --  27.6*  INR  --  2.09* 2.18*  --  2.55*  CREATININE 1.54*  --   --  1.61* 1.63*  TROPONINI  --  0.04*  --   --   --     Estimated Creatinine Clearance: 35.1 mL/min (by C-G formula based on Cr of 1.63).  Assessment: 81yo male known to be medically noncompliant c/o abdominal pain and constipation x5d, to be admitted for observation, to continue Coumadin for Afib (current INR at goal); also to continue milrinone for CHF. INR therapeutic today at 2.55 but increased from 2.18 yesterday.  PTA Coumadin 4mg  daily exc 2mg  TTS  Goal of Therapy:  INR 2-3 Monitor platelets by anticoagulation protocol: Yes   Plan:  Coumadin 2mg  PO x 1 tonight Monitor daily INR, CBC, s/s of bleed F/U WBC trend, Cards consult re milrinone  Yulieth Carrender J 07/06/2014,8:39 AM

## 2014-07-06 NOTE — Progress Notes (Signed)
Advanced Heart Failure Rounding Note   Subjective:    Ab pain and distension improving. Breathing ok. CVP 7-8. Co-ox good. Renal function stable.  Weight at baseline.  Still feels somewhat constipated.   CT ab shows slowly enlarging retroperitoneal mass   Objective:   Weight Range:  Vital Signs:   Temp:  [97.3 F (36.3 C)-98.2 F (36.8 C)] 97.9 F (36.6 C) (01/17 0800) Pulse Rate:  [67-84] 81 (01/17 1000) Resp:  [12-31] 17 (01/17 1000) BP: (82-137)/(42-63) 110/56 mmHg (01/17 1000) SpO2:  [91 %-100 %] 100 % (01/17 1000) Weight:  [67 kg (147 lb 11.3 oz)-67.8 kg (149 lb 7.6 oz)] 67.45 kg (148 lb 11.2 oz) (01/17 0300)    Weight change: Filed Weights   07/05/14 1509 07/05/14 1800 07/06/14 0300  Weight: 67 kg (147 lb 11.3 oz) 67.8 kg (149 lb 7.6 oz) 67.45 kg (148 lb 11.2 oz)    Intake/Output:   Intake/Output Summary (Last 24 hours) at 07/06/14 1128 Last data filed at 07/06/14 1000  Gross per 24 hour  Intake 437.65 ml  Output    875 ml  Net -437.35 ml     Physical Exam: General: Elderly, chronically ill appearing. NAD.No resp difficulty; interpreter present HEENT: normal Neck: supple. JVD 7-8; Carotids 2+ bilaterally; no bruits. No lymphadenopathy or thryomegaly appreciated.  Cor: PMI laterally displaced. Regular rhythm and rate. No s3 or murmur.  Lungs: clear Abdomen: soft, nontender, mildy distended. No hepatosplenomegaly. No bruits or masses. Good bowel sounds. Extremities: no cyanosis, clubbing, rash. Trace-1+ edema  up lower legs bilaterally.  Neuro: alert & orientedx3, cranial nerves grossly intact. Moves all 4 extremities w/o difficulty. Affect pleasant.  Telemetry: SR  Labs: Basic Metabolic Panel:  Recent Labs Lab 07/04/14 1937 07/05/14 0506 07/05/14 0836 07/06/14 0423  NA 133*  --  130* 129*  K 3.0*  --  2.8* 3.3*  CL 93*  --  90* 91*  CO2 28  --  33* 29  GLUCOSE 122*  --  165* 144*  BUN 38*  --  38* 42*  CREATININE 1.54*  --  1.61* 1.63*   CALCIUM 9.1  --  9.2 8.9  MG  --  1.9  --   --     Liver Function Tests:  Recent Labs Lab 07/04/14 1937  AST 71*  ALT 134*  ALKPHOS 167*  BILITOT 0.8  PROT 6.8  ALBUMIN 3.4*    Recent Labs Lab 07/04/14 1937  LIPASE 33   No results for input(s): AMMONIA in the last 168 hours.  CBC:  Recent Labs Lab 07/04/14 1937 07/05/14 0836  WBC 12.7* 14.3*  NEUTROABS 10.8*  --   HGB 11.6* 11.6*  HCT 34.0* 34.7*  MCV 81.7 83.2  PLT 303 306    Cardiac Enzymes:  Recent Labs Lab 07/04/14 1959  TROPONINI 0.04*    BNP: BNP (last 3 results)  Recent Labs  03/01/14 1959 03/08/14 2230 06/05/14 0332  PROBNP 11880.0* 15334.0* 19533.0*     Other results:  Imaging: Ct Abdomen Pelvis W Contrast  07/05/2014   CLINICAL DATA:  Acute onset of generalized abdominal pain and constipation. Abdominal swelling. Initial encounter.  EXAM: CT ABDOMEN AND PELVIS WITH CONTRAST  TECHNIQUE: Multidetector CT imaging of the abdomen and pelvis was performed using the standard protocol following bolus administration of intravenous contrast.  CONTRAST:  80 mL of Omnipaque 300 IV contrast  COMPARISON:  CT of the abdomen and pelvis from 05/31/2014  FINDINGS: Mild bibasilar airspace opacities may reflect atelectasis  or possibly mild pneumonia. Cardiomegaly is noted. Pacemaker/AICD leads are partially imaged. Diffuse coronary artery calcifications are seen.  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 It has increased in size from May of 2015, at which time it measured approximately 4.8 cm, though it has changed only minimally from the recent prior study. It measures approximately 6.3 x 4.9 x 3.0 cm.  Numerous calcified granulomata are seen within the spleen. The liver is unremarkable in appearance. The gallbladder is grossly unremarkable, with minimally increased attenuation possibly reflecting vicarious excretion  of contrast from a prior study. The pancreas and adrenal glands are grossly unremarkable.  Scattered bilateral renal cysts measure up to 5.3 cm in size. Nonspecific perinephric stranding and fluid are seen. The kidneys are otherwise grossly unremarkable. There is no evidence of hydronephrosis. No renal or ureteral stones are seen.  No free fluid is identified. The small bowel is unremarkable in appearance. The stomach is within normal limits. No acute vascular abnormalities are seen. Relatively diffuse calcification is seen along the abdominal aorta and its branches.  The appendix is normal in caliber, without evidence of appendicitis. The cecum is flipped anteriorly. Scattered diverticulosis is noted along the descending and proximal sigmoid colon. The sigmoid colon is somewhat redundant. The rectum is partially distended with stool, measuring 6.5 cm in transverse dimension.  The bladder is mildly distended and grossly unremarkable. The prostate is borderline enlarged, measuring 4.8 cm in transverse dimension. No inguinal lymphadenopathy is seen.  No acute osseous abnormalities are identified.  IMPRESSION: 1. Mild bibasilar airspace opacities may reflect atelectasis or possibly mild pneumonia. 2. 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 It has increased in size from 4.8 cm in May 2015, now measuring 6.3 x 4.9 x 3.0 cm. This is only minimally changed in size from the recent prior study. Would correlate clinically as to whether this has been formally diagnosed. 3. Scattered bilateral renal cysts noted. 4. Cardiomegaly noted.  Diffuse coronary artery calcifications seen. 5. Relatively diffuse calcification along the abdominal aorta and its branches. 6. Scattered diverticulosis along the descending and proximal sigmoid colon. Rectum partially distended with stool, measuring 6.5 cm in transverse dimension. 7. Borderline enlarged prostate.   These results were called by telephone at the time of interpretation on 07/05/2014 at 1:55 am to Dr. Christy Gentles, who verbally acknowledged these results.   Electronically Signed   By: Garald Balding M.D.   On: 07/05/2014 01:57   Dg Chest Portable 1 View  07/05/2014   CLINICAL DATA:  Port-A-Cath in place.  Vomiting for 4 days.  EXAM: PORTABLE CHEST - 1 VIEW  COMPARISON:  06/05/2014  FINDINGS: Tip of the right central line in the atrial caval junction. Multi lead left-sided pacemaker remains in place. Lower lung volumes from prior. Equivocal increase in cardiomegaly versus differences in technique. There is worsening vascular congestion and pulmonary edema. Questionable blunting of right costophrenic angle effusion. No pneumothorax.  IMPRESSION: 1. Tip of the right central line at the atrial caval junction. 2. Findings consistent with worsening CHF.   Electronically Signed   By: Jeb Levering M.D.   On: 07/05/2014 00:12      Medications:     Scheduled Medications: . amiodarone  200 mg Oral Daily  . aspirin EC  325 mg Oral Daily  . docusate sodium  100 mg Oral BID  . magnesium oxide  400 mg  Oral BID  . metolazone  5 mg Oral Daily  . pantoprazole (PROTONIX) IV  40 mg Intravenous Q24H  . polyethylene glycol  17 g Oral BID  . senna  2 tablet Oral BID  . sildenafil  20 mg Oral TID  . sodium chloride  3 mL Intravenous Q12H  . spironolactone  25 mg Oral BID  . torsemide  80 mg Oral Daily  . warfarin  2 mg Oral ONCE-1800  . Warfarin - Pharmacist Dosing Inpatient   Does not apply q1800     Infusions: . milrinone 0.5 mcg/kg/min (07/06/14 0811)     PRN Medications:  sodium chloride, acetaminophen **OR** acetaminophen, alum & mag hydroxide-simeth, bisacodyl, HYDROmorphone (DILAUDID) injection, milk and molasses, ondansetron **OR** ondansetron (ZOFRAN) IV, oxyCODONE, sodium chloride, sorbitol   Assessment:   1. Ab distension/constipation 2. Chronic systolic HF (EF 97%) on home  milrinone 3. PAH 4. CKD, stage III-IV 5. PAF in NSR on amio 6. Retroperitoneal mass. 7. Hypokalemia  Plan/Discussion:    Belly feels much better today. CVP and co-ox stable. Will give a dose of sorbitol today. Continue milrinone and home torsemide regimen. Supp K+. Hopefully home soon.   We have always though mass is liposarcoma but he has had biopsy and it was felt to be benign. i suspect biospy sample may have been inadequate. In either case he is not candidate for aggressive therapy for mass due to his end-stage HF.  Length of Stay: 2   Glori Bickers MD 07/06/2014, 11:28 AM  Advanced Heart Failure Team Pager [phone] (M-F; [place])  Please contact [facility] for night-coverage after hours (4p -7a ) and weekends on [email or web address]

## 2014-07-06 NOTE — Progress Notes (Signed)
PROGRESS NOTE    Tyler Christensen ZDG:[id number] DOB: June 02, 1933 DOA: 07/04/2014 PCP: Harvie Junior, MD  HPI/Brief narrative 81 year old Spanish-speaking male patient with PMHx significant for chronic systolic CHF (EF 95%)-JO milrinone drip, NICM, LBBB, PAF (on coumadin), HTN, HLD, CVA, severe Pulmonary Hypertension, h/o seizure d/o (2/2 CVA 05/2012), Staphylococcus bacteremia treated with IV antibiotics in May 2015, TEE negative for endocarditis, retroperitoneal mass and biopsy revealed benign lesion, stage III chronic kidney disease , chronic constipation, chronic respiratory failure on home oxygen & DNR, presented with abdominal pain and distention of 5-6 days duration, constipation, nausea and intermittent bilious/nonbloody emesis, chronic unchanged dyspnea and no chest pain. CT abdomen revealed no signs of obstruction but showed enlarging retroperitoneal mass and increased stool in the vault. No cough or fever reported. Cardiology consulted.   Assessment/Plan:  1. Abdominal pain: Likely secondary to constipation and less likely secondary to enlarging retroperitoneal mass. Status post enema 1 in the ED 1/16 and patient had small hard stool and flatus. Aggressive bowel regimen. Pain resolved. Flatus++. No bM since ED. No N/V. Tolerating full liquids > will advance to solid food. Other DD for pain- passive hepatic congestion from CHF. 2. Chronic systolic CHF/NICM/severe pulmonary hypertension/minimally elevated troponin: Seems volume overloaded. Continue amiodarone, metolazone, Sildinafil, Aldactone, torsemide and milrinone. Elevated troponin likely secondary to demand ischemia. Cardiology input appreciated- Mx per Cards. 3. Hypokalemia: Better. Replace and follow BMP. 4. Stage III chronic kidney disease: Creatinine at baseline. 5. Chronic anemia: Stable. 6. PAF: Currently in V Paced rythm. Anticoagulated with Coumadin-continue per pharmacy. 7. History of CVA: Anticoagulated with  Coumadin. 8. Nausea and vomiting: Probably related to problem #1. Treatment for constipation. When necessary antiemetics. Improved > advance diet. 9. Essential hypertension: Soft blood pressures. Monitor closely. 10. History of Staphylococcus aureus bacteremia: Treated 11. Enlarging right-sided retroperitoneal mass: As per previous records, biopsy was benign in the past.? Need for further workup i.e. repeat biopsy. Patient will not be a candidate for any aggressive surgical intervention.  12. DNR 13. Hyponatremia: related to CHF   Code Status: DO NOT RESUSCITATE  Family Communication: None at bedside. Disposition Plan: Home when medically stable.   Consultants:  Cardiology  Procedures:  None  Antibiotics:  None   Subjective: Pain resolved. Flatus++. No bM since ED. No N/V. Feels much better. 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  Objective: Filed Vitals:   07/06/14 0500 07/06/14 0600 07/06/14 0700 07/06/14 0800  BP: 106/60 89/52 107/50   Pulse: 84 74 80   Temp:    97.9 F (36.6 C)  TempSrc:    Oral  Resp: 12 12 16    Height:      Weight:      SpO2: 99% 100% 93%     Intake/Output Summary (Last 24 hours) at 07/06/14 0811 Last data filed at 07/06/14 0700  Gross per 24 hour  Intake 406.45 ml  Output    675 ml  Net -268.55 ml   Filed Weights   07/05/14 1509 07/05/14 1800 07/06/14 0300  Weight: 67 kg (147 lb 11.3 oz) 67.8 kg (149 lb 7.6 oz) 67.45 kg (148 lb 11.2 oz)     Exam:  General exam: Elderly frail male patient lying comfortably propped up in bed in no obvious distress. Respiratory system: Diminished breath sounds in the bases with few bibasal fine crackles. Rest of lung fields clear to auscultation. No increased work of breathing. Cardiovascular system: S1 & S2 heard, RRR. No murmurs, gallops, clicks. JVD +. 1+ bilateral leg edema.. Gastrointestinal  system: Abdomen is distended mildly, soft, min tender RUQ without peritoneal signs. No organomegaly  or masses appreciated. Normal bowel sounds heard. Central nervous system: Alert and oriented. No focal neurological deficits. Extremities: Symmetric 5 x 5 power.   Data Reviewed: Basic Metabolic Panel:  Recent Labs Lab 07/04/14 1937 07/05/14 0506 07/05/14 0836 07/06/14 0423  NA 133*  --  130* 129*  K 3.0*  --  2.8* 3.3*  CL 93*  --  90* 91*  CO2 28  --  33* 29  GLUCOSE 122*  --  165* 144*  BUN 38*  --  38* 42*  CREATININE 1.54*  --  1.61* 1.63*  CALCIUM 9.1  --  9.2 8.9  MG  --  1.9  --   --    Liver Function Tests:  Recent Labs Lab 07/04/14 1937  AST 71*  ALT 134*  ALKPHOS 167*  BILITOT 0.8  PROT 6.8  ALBUMIN 3.4*    Recent Labs Lab 07/04/14 1937  LIPASE 33   No results for input(s): AMMONIA in the last 168 hours. CBC:  Recent Labs Lab 07/04/14 1937 07/05/14 0836  WBC 12.7* 14.3*  NEUTROABS 10.8*  --   HGB 11.6* 11.6*  HCT 34.0* 34.7*  MCV 81.7 83.2  PLT 303 306   Cardiac Enzymes:  Recent Labs Lab 07/04/14 1959  TROPONINI 0.04*   BNP (last 3 results)  Recent Labs  03/01/14 1959 03/08/14 2230 06/05/14 0332  PROBNP 11880.0* 15334.0* 19533.0*   CBG: No results for input(s): GLUCAP in the last 168 hours.  Recent Results (from the past 240 hour(s))  MRSA PCR Screening     Status: None   Collection Time: 07/05/14  6:09 PM  Result Value Ref Range Status   MRSA by PCR NEGATIVE NEGATIVE Final    Comment:        The GeneXpert MRSA Assay (FDA approved for NASAL specimens only), is one component of a comprehensive MRSA colonization surveillance program. It is not intended to diagnose MRSA infection nor to guide or monitor treatment for MRSA infections.          Studies: Ct Abdomen Pelvis W Contrast  07/05/2014   CLINICAL DATA:  Acute onset of generalized abdominal pain and constipation. Abdominal swelling. Initial encounter.  EXAM: CT ABDOMEN AND PELVIS WITH CONTRAST  TECHNIQUE: Multidetector CT imaging of the abdomen and pelvis  was performed using the standard protocol following bolus administration of intravenous contrast.  CONTRAST:  80 mL of Omnipaque 300 IV contrast  COMPARISON:  CT of the abdomen and pelvis from 05/31/2014  FINDINGS: Mild bibasilar airspace opacities may reflect atelectasis or possibly mild pneumonia. Cardiomegaly is noted. Pacemaker/AICD leads are partially imaged. Diffuse coronary artery calcifications are seen.  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 It has increased in size from May of 2015, at which time it measured approximately 4.8 cm, though it has changed only minimally from the recent prior study. It measures approximately 6.3 x 4.9 x 3.0 cm.  Numerous calcified granulomata are seen within the spleen. The liver is unremarkable in appearance. The gallbladder is grossly unremarkable, with minimally increased attenuation possibly reflecting vicarious excretion of contrast from a prior study. The pancreas and adrenal glands are grossly unremarkable.  Scattered bilateral renal cysts measure up to 5.3 cm in size. Nonspecific perinephric stranding and fluid are seen. The kidneys are otherwise grossly unremarkable. There is no evidence of hydronephrosis. No  renal or ureteral stones are seen.  No free fluid is identified. The small bowel is unremarkable in appearance. The stomach is within normal limits. No acute vascular abnormalities are seen. Relatively diffuse calcification is seen along the abdominal aorta and its branches.  The appendix is normal in caliber, without evidence of appendicitis. The cecum is flipped anteriorly. Scattered diverticulosis is noted along the descending and proximal sigmoid colon. The sigmoid colon is somewhat redundant. The rectum is partially distended with stool, measuring 6.5 cm in transverse dimension.  The bladder is mildly distended and grossly unremarkable. The prostate is  borderline enlarged, measuring 4.8 cm in transverse dimension. No inguinal lymphadenopathy is seen.  No acute osseous abnormalities are identified.  IMPRESSION: 1. Mild bibasilar airspace opacities may reflect atelectasis or possibly mild pneumonia. 2. 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 It has increased in size from 4.8 cm in May 2015, now measuring 6.3 x 4.9 x 3.0 cm. This is only minimally changed in size from the recent prior study. Would correlate clinically as to whether this has been formally diagnosed. 3. Scattered bilateral renal cysts noted. 4. Cardiomegaly noted.  Diffuse coronary artery calcifications seen. 5. Relatively diffuse calcification along the abdominal aorta and its branches. 6. Scattered diverticulosis along the descending and proximal sigmoid colon. Rectum partially distended with stool, measuring 6.5 cm in transverse dimension. 7. Borderline enlarged prostate.  These results were called by telephone at the time of interpretation on 07/05/2014 at 1:55 am to Dr. Christy Gentles, who verbally acknowledged these results.   Electronically Signed   By: Garald Balding M.D.   On: 07/05/2014 01:57   Dg Chest Portable 1 View  07/05/2014   CLINICAL DATA:  Port-A-Cath in place.  Vomiting for 4 days.  EXAM: PORTABLE CHEST - 1 VIEW  COMPARISON:  06/05/2014  FINDINGS: Tip of the right central line in the atrial caval junction. Multi lead left-sided pacemaker remains in place. Lower lung volumes from prior. Equivocal increase in cardiomegaly versus differences in technique. There is worsening vascular congestion and pulmonary edema. Questionable blunting of right costophrenic angle effusion. No pneumothorax.  IMPRESSION: 1. Tip of the right central line at the atrial caval junction. 2. Findings consistent with worsening CHF.   Electronically Signed   By: Jeb Levering M.D.   On: 07/05/2014 00:12        Scheduled Meds: .  amiodarone  200 mg Oral Daily  . aspirin EC  325 mg Oral Daily  . docusate sodium  100 mg Oral BID  . magnesium oxide  400 mg Oral BID  . metolazone  5 mg Oral Daily  . pantoprazole (PROTONIX) IV  40 mg Intravenous Q24H  . potassium chloride  40 mEq Oral Once  . senna  2 tablet Oral BID  . sildenafil  20 mg Oral TID  . sodium chloride  3 mL Intravenous Q12H  . spironolactone  25 mg Oral BID  . torsemide  80 mg Oral Daily  . Warfarin - Pharmacist Dosing Inpatient   Does not apply q1800   Continuous Infusions: . milrinone 0.5 mcg/kg/min (07/05/14 2306)    Active Problems:   Acute On Chronic systolic congestive heart failure, NYHA class 2 EF 20 % 12.23.2013   HTN (hypertension)   Atrial fibrillation   HLD (hyperlipidemia)   Long-term (current) use of anticoagulants   ICD (implantable cardioverter-defibrillator), biventricular, in situ   Retroperitoneal mass   Elevated troponin   Abdominal pain  Pulmonary HTN    Time spent: 40 minutes.    Vernell Leep, MD, FACP, FHM. [facility] Pager [phone]  If 7PM-7AM, please contact night-coverage [email or web address] Password [email or web address] 07/06/2014, 8:11 AM    LOS: 2 days

## 2014-07-07 DIAGNOSIS — I059 Rheumatic mitral valve disease, unspecified: Secondary | ICD-10-CM

## 2014-07-07 DIAGNOSIS — R1900 Intra-abdominal and pelvic swelling, mass and lump, unspecified site: Secondary | ICD-10-CM

## 2014-07-07 DIAGNOSIS — K5900 Constipation, unspecified: Principal | ICD-10-CM

## 2014-07-07 LAB — CBC
HEMATOCRIT: 31.4 % — AB (ref 39.0–52.0)
Hemoglobin: 10.4 g/dL — ABNORMAL LOW (ref 13.0–17.0)
MCH: 26.9 pg (ref 26.0–34.0)
MCHC: 33.1 g/dL (ref 30.0–36.0)
MCV: 81.3 fL (ref 78.0–100.0)
PLATELETS: 302 10*3/uL (ref 150–400)
RBC: 3.86 MIL/uL — AB (ref 4.22–5.81)
RDW: 15.8 % — AB (ref 11.5–15.5)
WBC: 11.3 10*3/uL — ABNORMAL HIGH (ref 4.0–10.5)

## 2014-07-07 LAB — PROTIME-INR
INR: 2.83 — ABNORMAL HIGH (ref 0.00–1.49)
Prothrombin Time: 30 seconds — ABNORMAL HIGH (ref 11.6–15.2)

## 2014-07-07 LAB — BASIC METABOLIC PANEL
Anion gap: 9 (ref 5–15)
BUN: 47 mg/dL — ABNORMAL HIGH (ref 6–23)
CALCIUM: 8.8 mg/dL (ref 8.4–10.5)
CO2: 32 mmol/L (ref 19–32)
CREATININE: 1.96 mg/dL — AB (ref 0.50–1.35)
Chloride: 88 mEq/L — ABNORMAL LOW (ref 96–112)
GFR calc Af Amer: 36 mL/min — ABNORMAL LOW (ref >90)
GFR, EST NON AFRICAN AMERICAN: 31 mL/min — AB (ref >90)
Glucose, Bld: 143 mg/dL — ABNORMAL HIGH (ref 70–99)
Potassium: 3.1 mmol/L — ABNORMAL LOW (ref 3.5–5.1)
SODIUM: 129 mmol/L — AB (ref 135–145)

## 2014-07-07 MED ORDER — MILRINONE IN DEXTROSE 20 MG/100ML IV SOLN
0.5000 ug/kg/min | INTRAVENOUS | Status: DC
  Administered 2014-07-07 – 2014-07-10 (×7): 0.5 ug/kg/min via INTRAVENOUS
  Filled 2014-07-07 (×8): qty 100

## 2014-07-07 MED ORDER — POTASSIUM CHLORIDE CRYS ER 20 MEQ PO TBCR
40.0000 meq | EXTENDED_RELEASE_TABLET | ORAL | Status: AC
  Administered 2014-07-07 (×2): 40 meq via ORAL
  Filled 2014-07-07 (×2): qty 2

## 2014-07-07 MED ORDER — WARFARIN SODIUM 2 MG PO TABS
2.0000 mg | ORAL_TABLET | Freq: Once | ORAL | Status: AC
  Administered 2014-07-07: 2 mg via ORAL
  Filled 2014-07-07: qty 1

## 2014-07-07 MED ORDER — FLEET ENEMA 7-19 GM/118ML RE ENEM
1.0000 | ENEMA | Freq: Every day | RECTAL | Status: DC | PRN
  Administered 2014-07-07: 1 via RECTAL
  Filled 2014-07-07 (×2): qty 1

## 2014-07-07 NOTE — Progress Notes (Signed)
  Echocardiogram 2D Echocardiogram has been performed.  Tyler Christensen [name] 07/07/2014, 3:27 PM

## 2014-07-07 NOTE — Progress Notes (Signed)
Advanced Heart Failure Rounding Note   Subjective:    Ab pain and distension persist. No BM despite sorbitol. Continue milrinone 0.375 mcg.  Still constipated.   CT ab shows slowly enlarging retroperitoneal mass  Creatinine 1.6>1.96    Objective:   Weight Range:  Vital Signs:   Temp:  [97.6 F (36.4 C)-98.4 F (36.9 C)] 98.3 F (36.8 C) (01/18 0407) Pulse Rate:  [46-141] 75 (01/18 0700) Resp:  [10-25] 13 (01/18 0700) BP: (85-115)/(33-64) 92/38 mmHg (01/18 0700) SpO2:  [68 %-100 %] 95 % (01/18 0700) Weight:  [149 lb 12.8 oz (67.949 kg)] 149 lb 12.8 oz (67.949 kg) (01/18 0407) Last BM Date: 07/06/14  Weight change: Filed Weights   07/05/14 1800 07/06/14 0300 07/07/14 0407  Weight: 149 lb 7.6 oz (67.8 kg) 148 lb 11.2 oz (67.45 kg) 149 lb 12.8 oz (67.949 kg)    Intake/Output:   Intake/Output Summary (Last 24 hours) at 07/07/14 0805 Last data filed at 07/07/14 0700  Gross per 24 hour  Intake  239.2 ml  Output   1525 ml  Net -1285.8 ml     Physical Exam: General: Elderly, chronically ill appearing. NAD. Sitting on the side of the bed. No resp difficulty; interpreter present, Millie RN HEENT: normal Neck: supple. JVD 7-8; Carotids 2+ bilaterally; no bruits. No lymphadenopathy or thryomegaly appreciated.  Cor: PMI laterally displaced. Regular rhythm and rate. No s3 or murmur.  Lungs: clear Abdomen: soft, nontender, mildy distended. No hepatosplenomegaly. No bruits or masses. Good bowel sounds. Extremities: no cyanosis, clubbing, rash. Trace-edema  up lower legs bilaterally.  Neuro: alert & orientedx3, cranial nerves grossly intact. Moves all 4 extremities w/o difficulty. Affect pleasant.  Telemetry: SR  Labs: Basic Metabolic Panel:  Recent Labs Lab 07/04/14 1937 07/05/14 0506 07/05/14 0836 07/06/14 0423 07/07/14 0500  NA 133*  --  130* 129* 129*  K 3.0*  --  2.8* 3.3* 3.1*  CL 93*  --  90* 91* 88*  CO2 28  --  33* 29 32  GLUCOSE 122*  --  165* 144*  143*  BUN 38*  --  38* 42* 47*  CREATININE 1.54*  --  1.61* 1.63* 1.96*  CALCIUM 9.1  --  9.2 8.9 8.8  MG  --  1.9  --   --   --     Liver Function Tests:  Recent Labs Lab 07/04/14 1937  AST 71*  ALT 134*  ALKPHOS 167*  BILITOT 0.8  PROT 6.8  ALBUMIN 3.4*    Recent Labs Lab 07/04/14 1937  LIPASE 33   No results for input(s): AMMONIA in the last 168 hours.  CBC:  Recent Labs Lab 07/04/14 1937 07/05/14 0836 07/07/14 0500  WBC 12.7* 14.3* 11.3*  NEUTROABS 10.8*  --   --   HGB 11.6* 11.6* 10.4*  HCT 34.0* 34.7* 31.4*  MCV 81.7 83.2 81.3  PLT 303 306 302    Cardiac Enzymes:  Recent Labs Lab 07/04/14 1959  TROPONINI 0.04*    BNP: BNP (last 3 results)  Recent Labs  03/01/14 1959 03/08/14 2230 06/05/14 0332  PROBNP 11880.0* 15334.0* 19533.0*     Other results:  Imaging: No results found.   Medications:     Scheduled Medications: . amiodarone  200 mg Oral Daily  . aspirin EC  325 mg Oral Daily  . docusate sodium  100 mg Oral BID  . magnesium oxide  400 mg Oral BID  . metolazone  5 mg Oral Daily  . pantoprazole (PROTONIX)  IV  40 mg Intravenous Q24H  . polyethylene glycol  17 g Oral BID  . potassium chloride  40 mEq Oral Q4H  . senna  2 tablet Oral BID  . sildenafil  20 mg Oral TID  . sodium chloride  3 mL Intravenous Q12H  . spironolactone  25 mg Oral BID  . torsemide  80 mg Oral Daily  . Warfarin - Pharmacist Dosing Inpatient   Does not apply q1800    Infusions: . milrinone 0.5 mcg/kg/min (07/07/14 0435)    PRN Medications: sodium chloride, acetaminophen **OR** acetaminophen, alum & mag hydroxide-simeth, bisacodyl, HYDROmorphone (DILAUDID) injection, ondansetron **OR** ondansetron (ZOFRAN) IV, oxyCODONE, sodium chloride, sodium phosphate, sorbitol   Assessment:   1. Ab distension/constipation 2. Chronic systolic HF (EF 51%) on home milrinone 3. PAH 4. CKD, stage III-IV 5. PAF in NSR on amio 6. Retroperitoneal mass. 7.  Hypokalemia  Plan/Discussion:    Still constipated.  CVP and co-ox stable. Will give fleet enema. Continue milrinone and home torsemide regimen. Supp K+. Ask cardiac rehab to evaluate.  Hopefully home soon with [facility] to resume care.   We have always thought mass is liposarcoma but he has had biopsy and it was felt to be benign. ? suspect biospy sample may have been inadequate. In either case he is not candidate for aggressive therapy for mass due to his end-stage HF.  Length of Stay: 3   CLEGG,[name] NP-C  07/07/2014, 8:05 AM  Advanced Heart Failure Team Pager [phone] (M-F; [place])   Please contact [facility] for night-coverage after hours (4p -7a ) and weekends on [email or web address]  Patient seen and examined with Darrick Grinder, NP. We discussed all aspects of the encounter. I agree with the assessment and plan as stated above.   HF stable. Ab distension actually doesn't seem to bad compared to baseline. Given his discomfort agree with trying enema. Hopefully home soon.   Ian Cavey,MD 10:11 AM

## 2014-07-07 NOTE — Progress Notes (Signed)
PT Cancellation Note  Patient Details Name: Tyler Christensen MRN: [id number] DOB: 1932/10/02   Cancelled Treatment:    Reason Eval/Treat Not Completed: Patient declined, no reason specified.  Hungry and dinner just arrive.  Also stated he just went to the bathroom.  Will see tomorrow in am if able.07/07/2014  Donnella Sham, [place] [phone]  (pager)   Telesia Ates, Tessie Fass 07/07/2014, 5:46 PM

## 2014-07-07 NOTE — Progress Notes (Signed)
ANTICOAGULATION CONSULT NOTE - Follow Up Consult  Pharmacy Consult for Coumadin Indication: atrial fibrillation  Allergies  Allergen Reactions  . Lisinopril Rash    Patient Measurements: Height: 5\' 8"  (172.7 cm) Weight: 149 lb 12.8 oz (67.949 kg) IBW/kg (Calculated) : 68.4  Vital Signs: Temp: 97.8 F (36.6 C) (01/18 0844) Temp Source: Oral (01/18 0844) BP: 96/78 mmHg (01/18 1100) Pulse Rate: 76 (01/18 1100)  Labs:  Recent Labs  07/04/14 1937  07/04/14 1959 07/05/14 0506 07/05/14 0836 07/06/14 0423 07/07/14 0500  HGB 11.6*  --   --   --  11.6*  --  10.4*  HCT 34.0*  --   --   --  34.7*  --  31.4*  PLT 303  --   --   --  306  --  302  LABPROT  --   < > 23.6* 24.4*  --  27.6* 30.0*  INR  --   < > 2.09* 2.18*  --  2.55* 2.83*  CREATININE 1.54*  --   --   --  1.61* 1.63* 1.96*  TROPONINI  --   --  0.04*  --   --   --   --   < > = values in this interval not displayed.  Estimated Creatinine Clearance: 29.4 mL/min (by C-G formula based on Cr of 1.96).  Assessment: Tyler Christensen well known to the HF service/pharmacy admitted with abdominal pain 2/2 his peritoneal mass. On coumadin pta for afib which was resumed. INR therapeutic on admission at 2.09. He missed a dose 1/15 however INR continues to trend up to 2.83 today. Of note, he was on amiodarone 200mg  bid pta, but now just 200mg  daily.  Home coumadin dose: 4mg  daily except 2mg  TTS - last taken 1/14  Goal of Therapy:  INR 2-3 Monitor platelets by anticoagulation protocol: Yes   Plan:  1) Repeat coumadin 2mg  x 1 given uptrend in INR 2) INR in AM  Deboraha Sprang 07/07/2014,11:04 AM

## 2014-07-07 NOTE — Care Management Note (Addendum)
    Page 1 of 1   07/10/2014     10:03:39 AM CARE MANAGEMENT NOTE 07/10/2014  Patient:  Tyler Christensen, Tyler Christensen   Account Number:  [id number]  Date Initiated:  07/07/2014  Documentation initiated by:  Elissa Hefty  Subjective/Objective Assessment:   adm w elev troponin     Action/Plan:   lives w fam, pcp dr Orpah Greek williams   Anticipated DC Date:  07/10/2014   Anticipated DC Plan:  [place]         [facility] Choice  Resumption Of Svcs/PTA Provider   Choice offered to / List presented to:          [facility] arranged  HH-1 RN  [place].   Status of service:   Medicare Important Message given?  YES (If response is "NO", the following Medicare IM given date fields will be blank) Date Medicare IM given:  07/07/2014 Medicare IM given by:  Elissa Hefty Date Additional Medicare IM given:  07/10/2014 Additional Medicare IM given by:  Elissa Hefty  Discharge Disposition:  [place]  Per UR Regulation:  Reviewed for med. necessity/level of care/duration of stay  If discussed at [place] of Stay Meetings, dates discussed:   07/10/2014    Comments:  1/21  1002 debbie Howell [place] rn,bsn pt for dc home. have alerted pam w [facility] of prob dc today.  1/18 1011 debbie Tyauna Lacaze rn,bsn [facility] aware of pt's hosp.

## 2014-07-07 NOTE — Progress Notes (Signed)
PROGRESS NOTE    Tyler Christensen BOF:[id number] DOB: [birth year] DOA: 07/04/2014 PCP: Harvie Junior, MD  HPI/Brief narrative 81 year old Spanish-speaking male patient with PMHx significant for chronic systolic CHF (EF 77%)-OE milrinone drip, NICM, LBBB, PAF (on coumadin), HTN, HLD, CVA, severe Pulmonary Hypertension, h/o seizure d/o (2/2 CVA 05/2012), Staphylococcus bacteremia treated with IV antibiotics in May 2015, TEE negative for endocarditis, retroperitoneal mass and biopsy revealed benign lesion, stage III chronic kidney disease , chronic constipation, chronic respiratory failure on home oxygen & DNR, presented with abdominal pain and distention of 5-6 days duration, constipation, nausea and intermittent bilious/nonbloody emesis, chronic unchanged dyspnea and no chest pain. CT abdomen revealed no signs of obstruction but showed enlarging retroperitoneal mass and increased stool in the vault. No cough or fever reported. Cardiology consulted.   Assessment/Plan:  1. Abdominal pain: Likely secondary to constipation and less likely secondary to enlarging retroperitoneal mass. Status post enema 1 in the ED 1/16 and patient had small hard stool and flatus. Aggressive bowel regimen. Pain resolved. Flatus++. No bM since ED. No N/V. Tolerating solid food/poor appetite. Other DD for pain- passive hepatic congestion from CHF. Fleet enema 1/18. 2. Chronic systolic CHF/NICM/severe pulmonary hypertension/minimally elevated troponin: Seems volume overloaded. Continue amiodarone, metolazone, Sildinafil, Aldactone, torsemide and milrinone. Elevated troponin likely secondary to demand ischemia. Cardiology input appreciated- Mx per Cards. Ideal candidate for hospice- defer to Cards. 3. Hypokalemia: Replacing and follow BMP. 4. Stage III chronic kidney disease: Creatinine slightly worse that yesterday- ? Cardiorenal/diuresis. Follow BMP. 5. Chronic anemia: Hb dropped by 1 gm without overt bleeding. FU CBC in  am. 6. PAF: Currently in V Paced rythm. Anticoagulated with Coumadin-continue per pharmacy. 7. History of CVA: Anticoagulated with Coumadin. 8. Nausea and vomiting: Probably related to problem #1. Treatment for constipation. When necessary antiemetics. Improved > advance diet. 9. Essential hypertension: Soft blood pressures. Monitor closely. 10. History of Staphylococcus aureus bacteremia: Treated 11. Enlarging right-sided retroperitoneal mass: As per previous records, biopsy was benign in the past. 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 Inadequate Bx sample). Not candidate for aggressive eval or Mx d/t end stage HF. 12. DNR 13. Hyponatremia: related to CHF   Code Status: DO NOT RESUSCITATE  Family Communication: None at bedside. Disposition Plan: Home when medically stable.   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 No BM since ED. Abd discomfort. Flatus++. Poor appetite but tolerating solids. Denies SOB.  Objective: Filed Vitals:   07/07/14 0510 07/07/14 0524 07/07/14 0600 07/07/14 0700  BP: 85/34 97/41 94/35  92/38  Pulse: 72 84 80 75  Temp:      TempSrc:      Resp: 12 25 10 13   Height:      Weight:      SpO2: 96% 97% 94% 95%    Intake/Output Summary (Last 24 hours) at 07/07/14 0755 Last data filed at 07/07/14 0700  Gross per 24 hour  Intake  249.6 ml  Output   1525 ml  Net -1275.4 ml   Filed Weights   07/05/14 1800 07/06/14 0300 07/07/14 0407  Weight: 67.8 kg (149 lb 7.6 oz) 67.45 kg (148 lb 11.2 oz) 67.949 kg (149 lb 12.8 oz)     Exam:  General exam: Elderly frail male patient sitting up at edge of bed in no obvious distress. Respiratory system: clear to auscultation. No increased work of breathing. Cardiovascular system: S1 & S2 heard, RRR. No murmurs,  gallops, clicks. JVD +. 1+ bilateral leg edema. Tele: V Paced rhythm. Gastrointestinal system:  Abdomen is distended mildly, soft, non tender. No organomegaly or masses appreciated. Normal bowel sounds heard. Central nervous system: Alert and oriented. No focal neurological deficits. Extremities: Symmetric 5 x 5 power.   Data Reviewed: Basic Metabolic Panel:  Recent Labs Lab 07/04/14 1937 07/05/14 0506 07/05/14 0836 07/06/14 0423 07/07/14 0500  NA 133*  --  130* 129* 129*  K 3.0*  --  2.8* 3.3* 3.1*  CL 93*  --  90* 91* 88*  CO2 28  --  33* 29 32  GLUCOSE 122*  --  165* 144* 143*  BUN 38*  --  38* 42* 47*  CREATININE 1.54*  --  1.61* 1.63* 1.96*  CALCIUM 9.1  --  9.2 8.9 8.8  MG  --  1.9  --   --   --    Liver Function Tests:  Recent Labs Lab 07/04/14 1937  AST 71*  ALT 134*  ALKPHOS 167*  BILITOT 0.8  PROT 6.8  ALBUMIN 3.4*    Recent Labs Lab 07/04/14 1937  LIPASE 33   No results for input(s): AMMONIA in the last 168 hours. CBC:  Recent Labs Lab 07/04/14 1937 07/05/14 0836 07/07/14 0500  WBC 12.7* 14.3* 11.3*  NEUTROABS 10.8*  --   --   HGB 11.6* 11.6* 10.4*  HCT 34.0* 34.7* 31.4*  MCV 81.7 83.2 81.3  PLT 303 306 302   Cardiac Enzymes:  Recent Labs Lab 07/04/14 1959  TROPONINI 0.04*   BNP (last 3 results)  Recent Labs  03/01/14 1959 03/08/14 2230 06/05/14 0332  PROBNP 11880.0* 15334.0* 19533.0*   CBG: No results for input(s): GLUCAP in the last 168 hours.  Recent Results (from the past 240 hour(s))  MRSA PCR Screening     Status: None   Collection Time: 07/05/14  6:09 PM  Result Value Ref Range Status   MRSA by PCR NEGATIVE NEGATIVE Final    Comment:        The GeneXpert MRSA Assay (FDA approved for NASAL specimens only), is one component of a comprehensive MRSA colonization surveillance program. It is not intended to diagnose MRSA infection nor to guide or monitor treatment for MRSA infections.          Studies: No results found.      Scheduled Meds: . amiodarone  200 mg Oral Daily  . aspirin EC  325  mg Oral Daily  . docusate sodium  100 mg Oral BID  . magnesium oxide  400 mg Oral BID  . metolazone  5 mg Oral Daily  . pantoprazole (PROTONIX) IV  40 mg Intravenous Q24H  . polyethylene glycol  17 g Oral BID  . potassium chloride  40 mEq Oral Q4H  . senna  2 tablet Oral BID  . sildenafil  20 mg Oral TID  . sodium chloride  3 mL Intravenous Q12H  . spironolactone  25 mg Oral BID  . torsemide  80 mg Oral Daily  . Warfarin - Pharmacist Dosing Inpatient   Does not apply q1800   Continuous Infusions: . milrinone 0.5 mcg/kg/min (07/07/14 0435)    Active Problems:   Acute On Chronic systolic congestive heart failure, NYHA class 2 EF 20 % 12.23.2013   HTN (hypertension)   Atrial fibrillation   HLD (hyperlipidemia)   Long-term (current) use of anticoagulants   ICD (implantable cardioverter-defibrillator), biventricular, in situ   Retroperitoneal mass   Elevated troponin  Abdominal pain   Pulmonary HTN    Time spent: 40 minutes.    Vernell Leep, MD, FACP, FHM. [facility] Pager [phone]  If 7PM-7AM, please contact night-coverage [email or web address] Password [email or web address] 07/07/2014, 7:55 AM    LOS: 3 days

## 2014-07-07 NOTE — Progress Notes (Signed)
   07/07/14 1100  Clinical Encounter Type  Visited With Patient;Health care provider  Visit Type Initial   Chaplain was referred to patient via spiritual care consult. Consult indicated that patient would like to create or update an advanced directive. Patient was notified that the patient speaks spanish primarily. A nurse on the unit who is spanish speaking provided translation between me and the patient. Patient indicated that he thinks he already has an advanced directive. Chaplain checked the patient's chart and there is no advanced directive on file for patient at this hospital. Patient is going to speak to his wife and if possible have a copy of advanced directive brought here. If patient does not have an advanced directive, chaplain left a spanish copy of the advanced directive form in case the patient would like to complete a new document. Chaplain will follow up as needed. Tyler Christensen, Claudius Sis, Chaplain  11:14 AM

## 2014-07-08 LAB — T3: T3, Total: 61.6 ng/dl — ABNORMAL LOW (ref 80.0–204.0)

## 2014-07-08 LAB — CARBOXYHEMOGLOBIN
CARBOXYHEMOGLOBIN: 1.4 % (ref 0.5–1.5)
Methemoglobin: 0.7 % (ref 0.0–1.5)
O2 Saturation: 79.2 %
TOTAL HEMOGLOBIN: 10.3 g/dL — AB (ref 13.5–18.0)

## 2014-07-08 LAB — CBC
HEMATOCRIT: 30.3 % — AB (ref 39.0–52.0)
Hemoglobin: 10.2 g/dL — ABNORMAL LOW (ref 13.0–17.0)
MCH: 27.2 pg (ref 26.0–34.0)
MCHC: 33.7 g/dL (ref 30.0–36.0)
MCV: 80.8 fL (ref 78.0–100.0)
Platelets: 280 10*3/uL (ref 150–400)
RBC: 3.75 MIL/uL — AB (ref 4.22–5.81)
RDW: 15.7 % — ABNORMAL HIGH (ref 11.5–15.5)
WBC: 10.6 10*3/uL — AB (ref 4.0–10.5)

## 2014-07-08 LAB — BASIC METABOLIC PANEL
Anion gap: 13 (ref 5–15)
BUN: 53 mg/dL — AB (ref 6–23)
CALCIUM: 8.6 mg/dL (ref 8.4–10.5)
CO2: 28 mmol/L (ref 19–32)
CREATININE: 1.79 mg/dL — AB (ref 0.50–1.35)
Chloride: 88 mEq/L — ABNORMAL LOW (ref 96–112)
GFR calc Af Amer: 40 mL/min — ABNORMAL LOW (ref >90)
GFR, EST NON AFRICAN AMERICAN: 34 mL/min — AB (ref >90)
GLUCOSE: 173 mg/dL — AB (ref 70–99)
POTASSIUM: 2.9 mmol/L — AB (ref 3.5–5.1)
SODIUM: 129 mmol/L — AB (ref 135–145)

## 2014-07-08 LAB — PROTIME-INR
INR: 2.74 — ABNORMAL HIGH (ref 0.00–1.49)
Prothrombin Time: 29.2 seconds — ABNORMAL HIGH (ref 11.6–15.2)

## 2014-07-08 LAB — T4: T4, Total: 5.6 ug/dL (ref 4.5–12.0)

## 2014-07-08 MED ORDER — POTASSIUM CHLORIDE CRYS ER 20 MEQ PO TBCR
30.0000 meq | EXTENDED_RELEASE_TABLET | ORAL | Status: AC
  Administered 2014-07-08 (×2): 30 meq via ORAL
  Filled 2014-07-08 (×4): qty 1

## 2014-07-08 MED ORDER — WARFARIN SODIUM 2 MG PO TABS
2.0000 mg | ORAL_TABLET | Freq: Once | ORAL | Status: AC
  Administered 2014-07-08: 2 mg via ORAL
  Filled 2014-07-08: qty 1

## 2014-07-08 MED ORDER — POTASSIUM CHLORIDE CRYS ER 20 MEQ PO TBCR
30.0000 meq | EXTENDED_RELEASE_TABLET | Freq: Once | ORAL | Status: AC
  Administered 2014-07-08: 30 meq via ORAL
  Filled 2014-07-08 (×2): qty 1

## 2014-07-08 NOTE — Evaluation (Signed)
Physical Therapy Evaluation Patient Details Name: Tyler Christensen MRN: [id number] DOB: 03/01/1933 Today's Date: 07/08/2014   History of Present Illness  Pt adm with abdominal pain and heart failure. Pt also with retroperitoneal mass. Abdominal pain likely due to constipation. PMH - systolic CHF (EF 62%), NICM, LBBB, PAF (on coumadin), HTN, HLD, CVA, severe Pulmonary Hypertension, h/o seizure d/o (2/2 CVA 05/2012  Clinical Impression  Pt admitted with above diagnosis. Pt currently with functional limitations due to the deficits listed below (see PT Problem List).  Pt will benefit from skilled PT to increase their independence and safety with mobility to allow discharge to the venue listed below.  Expect pt will be able to return home with family.     Follow Up Recommendations No PT follow up    Equipment Recommendations  Other (comment) (to be assessed)    Recommendations for Other Services       Precautions / Restrictions Precautions Precautions: Fall      Mobility  Bed Mobility               General bed mobility comments: Pt sitting EOB.  Transfers Overall transfer level: Needs assistance Equipment used: None Transfers: Sit to/from Stand Sit to Stand: Min guard         General transfer comment: Assist for balance  Ambulation/Gait Ambulation/Gait assistance: Min guard Ambulation Distance (Feet): 185 Feet Assistive device: None (IV pole) Gait Pattern/deviations: Step-through pattern;Decreased step length - right;Decreased step length - left     General Gait Details: Pt with slightly unsteady gait.  Stairs            Wheelchair Mobility    Modified Rankin (Stroke Patients Only)       Balance Overall balance assessment: Needs assistance Sitting-balance support: No upper extremity supported;Feet supported Sitting balance-Leahy Scale: Normal     Standing balance support: No upper extremity supported Standing balance-Leahy Scale: Fair                                Pertinent Vitals/Pain Pain Assessment: No/denies pain    Home Living Family/patient expects to be discharged to:: Private residence Living Arrangements: Spouse/significant other Available Help at Discharge: Family;Available 24 hours/day Type of Home: House Home Access: Level entry     Home Layout: One level Home Equipment: None      Prior Function Level of Independence: Independent               Hand Dominance   Dominant Hand: Right    Extremity/Trunk Assessment   Upper Extremity Assessment: Overall WFL for tasks assessed           Lower Extremity Assessment: Generalized weakness         Communication   Communication: Prefers language other than English;Interpreter utilized (phone interpreter)  Cognition Arousal/Alertness: Awake/alert Behavior During Therapy: WFL for tasks assessed/performed Overall Cognitive Status: Within Functional Limits for tasks assessed                      General Comments      Exercises        Assessment/Plan    PT Assessment Patient needs continued PT services  PT Diagnosis Difficulty walking;Generalized weakness   PT Problem List Decreased strength;Decreased activity tolerance;Decreased balance;Decreased mobility;Cardiopulmonary status limiting activity  PT Treatment Interventions DME instruction;Balance training;Gait training;Functional mobility training;Therapeutic activities;Therapeutic exercise;Patient/family education   PT Goals (Current goals can be found in the  Care Plan section) Acute Rehab PT Goals Patient Stated Goal: return home PT Goal Formulation: With patient Time For Goal Achievement: 07/15/14 Potential to Achieve Goals: Good    Frequency Min 3X/week   Barriers to discharge        Co-evaluation               End of Session   Activity Tolerance: Patient tolerated treatment well Patient left: in chair;with call bell/phone within reach;with chair alarm  set Nurse Communication: Mobility status         Time: 3300-7622 PT Time Calculation (min) (ACUTE ONLY): 22 min   Charges:   PT Evaluation $Initial PT Evaluation Tier I: 1 Procedure PT Treatments $Gait Training: 8-22 mins   PT G Codes:        Shadara Lopez 2014-07-20, 1:33 PM  [employer] PT [phone]

## 2014-07-08 NOTE — Progress Notes (Signed)
PROGRESS NOTE    Tyler Christensen YBO:[id number] DOB: 1932/11/14 DOA: 07/04/2014 PCP: Harvie Junior, MD  HPI/Brief narrative 81 year old Spanish-speaking male patient with PMHx significant for chronic systolic CHF (EF 27%)-PO milrinone drip, NICM, LBBB, PAF (on coumadin), HTN, HLD, CVA, severe Pulmonary Hypertension, h/o seizure d/o (2/2 CVA 05/2012), Staphylococcus bacteremia treated with IV antibiotics in May 2015, TEE negative for endocarditis, retroperitoneal mass and biopsy revealed benign lesion, stage III chronic kidney disease , chronic constipation, chronic respiratory failure on home oxygen & DNR, presented with abdominal pain and distention of 5-6 days duration, constipation, nausea and intermittent bilious/nonbloody emesis, chronic unchanged dyspnea and no chest pain. CT abdomen revealed no signs of obstruction but showed enlarging retroperitoneal mass and increased stool in the vault. No cough or fever reported. Cardiology following. Abdominal pain probably more due to constipation-improving with bowel regimen and enemas. Replacing potassium aggressively. Possible DC in 1-2 days.   Assessment/Plan:  1. Abdominal pain: Likely secondary to constipation and less likely secondary to enlarging retroperitoneal mass. Status post enema 1 on 1/16 & 1/18 - good effect. Aggressive bowel regimen. Pain improved. Tolerating solid food/poor appetite. Other DD for pain- passive hepatic congestion from CHF. Fleet enema 1/18. 2. Chronic systolic CHF/NICM/severe pulmonary hypertension/minimally elevated troponin: Seems volume overloaded. Continue amiodarone, metolazone, Sildinafil, Aldactone, torsemide and milrinone. Elevated troponin likely secondary to demand ischemia. Cardiology input appreciated- Mx per Cards. Ideal candidate for hospice- defer to Cards. 3. Hypokalemia: Low despite daily replacements. Replacing aggressively today and follow BMP. 4. Stage III chronic kidney disease: Creatinine slightly  better that yesterday- ? Cardiorenal/diuresis. Follow BMP. 5. Chronic anemia: Hb dropped by 1 gm day before yesterday but stable from yesterday. FU CBC in am. 6. PAF: Currently in V Paced rythm. Anticoagulated with Coumadin-continue per pharmacy. 7. History of CVA: Anticoagulated with Coumadin. 8. Nausea and vomiting: Probably related to problem #1. Treatment for constipation. When necessary antiemetics. Resolved. 9. Essential hypertension: Soft blood pressures. Monitor closely. 10. History of Staphylococcus aureus bacteremia: Treated 11. Enlarging right-sided retroperitoneal mass: As per previous records, biopsy was benign in the past. 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 Inadequate Bx sample). Not candidate for aggressive eval or Mx d/t end stage HF. 12. DNR 13. Hyponatremia: related to CHF 14. Chronic hypotension: Likely related to end-stage CHF. Asymptomatic.   Code Status: DO NOT RESUSCITATE  Family Communication: None at bedside. Disposition Plan: Home when medically stable- possibly DC 1-2 days..   Consultants:  Cardiology  Procedures:  None  Antibiotics:  None   Subjective: Large BM post enema 1/18. Abdominal discomfort improved. Denies dyspnea. Wants to and when he can go home.  Objective: Filed Vitals:   07/07/14 1935 07/08/14 0300 07/08/14 0500 07/08/14 0716  BP: 98/47 75/42  80/44  Pulse: 79 73  76  Temp: 98.1 F (36.7 C) 98.3 F (36.8 C)    TempSrc: Oral Oral    Resp: 25 18  20   Height:      Weight:   65.545 kg (144 lb 8 oz)   SpO2: 92% 91%  89%    Intake/Output Summary (Last 24 hours) at 07/08/14 0818 Last data filed at 07/08/14 0700  Gross per 24 hour  Intake 955.75 ml  Output   2725 ml  Net -1769.25 ml   Filed Weights   07/06/14 0300 07/07/14 0407 07/08/14 0500  Weight: 67.45 kg (148 lb 11.2 oz) 67.949 kg (149 lb 12.8 oz) 65.545 kg (144 lb 8 oz)     Exam:  General exam: Elderly frail male patient sitting up at edge of  bed in no obvious distress. Respiratory system: clear to auscultation. No increased work of breathing. Cardiovascular system: S1 & S2 heard, RRR. No murmurs, gallops, clicks. JVD +. 1+ bilateral leg edema. Tele: V Paced rhythm. Gastrointestinal system: Abdomen is distended mildly, soft, non tender. No organomegaly or masses appreciated. Normal bowel sounds heard. Central nervous system: Alert and oriented. No focal neurological deficits. Extremities: Symmetric 5 x 5 power.   Data Reviewed: Basic Metabolic Panel:  Recent Labs Lab 07/04/14 1937 07/05/14 0506 07/05/14 0836 07/06/14 0423 07/07/14 0500 07/08/14 0526  NA 133*  --  130* 129* 129* 129*  K 3.0*  --  2.8* 3.3* 3.1* 2.9*  CL 93*  --  90* 91* 88* 88*  CO2 28  --  33* 29 32 28  GLUCOSE 122*  --  165* 144* 143* 173*  BUN 38*  --  38* 42* 47* 53*  CREATININE 1.54*  --  1.61* 1.63* 1.96* 1.79*  CALCIUM 9.1  --  9.2 8.9 8.8 8.6  MG  --  1.9  --   --   --   --    Liver Function Tests:  Recent Labs Lab 07/04/14 1937  AST 71*  ALT 134*  ALKPHOS 167*  BILITOT 0.8  PROT 6.8  ALBUMIN 3.4*    Recent Labs Lab 07/04/14 1937  LIPASE 33   No results for input(s): AMMONIA in the last 168 hours. CBC:  Recent Labs Lab 07/04/14 1937 07/05/14 0836 07/07/14 0500 07/08/14 0526  WBC 12.7* 14.3* 11.3* 10.6*  NEUTROABS 10.8*  --   --   --   HGB 11.6* 11.6* 10.4* 10.2*  HCT 34.0* 34.7* 31.4* 30.3*  MCV 81.7 83.2 81.3 80.8  PLT 303 306 302 280   Cardiac Enzymes:  Recent Labs Lab 07/04/14 1959  TROPONINI 0.04*   BNP (last 3 results)  Recent Labs  03/01/14 1959 03/08/14 2230 06/05/14 0332  PROBNP 11880.0* 15334.0* 19533.0*   CBG: No results for input(s): GLUCAP in the last 168 hours.  Recent Results (from the past 240 hour(s))  MRSA PCR Screening     Status: None   Collection Time: 07/05/14  6:09 PM  Result Value Ref Range Status   MRSA by PCR NEGATIVE NEGATIVE Final    Comment:        The GeneXpert MRSA  Assay (FDA approved for NASAL specimens only), is one component of a comprehensive MRSA colonization surveillance program. It is not intended to diagnose MRSA infection nor to guide or monitor treatment for MRSA infections.          Studies: No results found.      Scheduled Meds: . amiodarone  200 mg Oral Daily  . aspirin EC  325 mg Oral Daily  . docusate sodium  100 mg Oral BID  . magnesium oxide  400 mg Oral BID  . pantoprazole (PROTONIX) IV  40 mg Intravenous Q24H  . polyethylene glycol  17 g Oral BID  . potassium chloride  30 mEq Oral Q4H  . potassium chloride  30 mEq Oral Once  . senna  2 tablet Oral BID  . sildenafil  20 mg Oral TID  . sodium chloride  3 mL Intravenous Q12H  . spironolactone  25 mg Oral BID  . warfarin  2 mg Oral ONCE-1800  . Warfarin - Pharmacist Dosing Inpatient   Does not apply q1800   Continuous Infusions: . milrinone 0.5 mcg/kg/min (07/07/14  1431)    Active Problems:   Acute On Chronic systolic congestive heart failure, NYHA class 2 EF 20 % 12.23.2013   HTN (hypertension)   Atrial fibrillation   HLD (hyperlipidemia)   Long-term (current) use of anticoagulants   ICD (implantable cardioverter-defibrillator), biventricular, in situ   Retroperitoneal mass   Elevated troponin   Abdominal pain   Pulmonary HTN    Time spent: 40 minutes.    Vernell Leep, MD, FACP, FHM. [facility] Pager [phone]  If 7PM-7AM, please contact night-coverage [email or web address] Password [email or web address] 07/08/2014, 8:18 AM    LOS: 4 days

## 2014-07-08 NOTE — Progress Notes (Signed)
ANTICOAGULATION CONSULT NOTE - Follow Up Consult  Pharmacy Consult for Coumadin Indication: atrial fibrillation  Allergies  Allergen Reactions  . Lisinopril Rash    Patient Measurements: Height: 5\' 8"  (172.7 cm) Weight: 144 lb 8 oz (65.545 kg) IBW/kg (Calculated) : 68.4  Vital Signs: Temp: 98.3 F (36.8 C) (01/19 0300) Temp Source: Oral (01/19 0300) BP: 80/44 mmHg (01/19 0716) Pulse Rate: 76 (01/19 0716)  Labs:  Recent Labs  07/05/14 0836 07/06/14 0423 07/07/14 0500 07/08/14 0526  HGB 11.6*  --  10.4* 10.2*  HCT 34.7*  --  31.4* 30.3*  PLT 306  --  302 280  LABPROT  --  27.6* 30.0* 29.2*  INR  --  2.55* 2.83* 2.74*  CREATININE 1.61* 1.63* 1.96* 1.79*    Estimated Creatinine Clearance: 31 mL/min (by C-G formula based on Cr of 1.79).  Assessment: 81yom well known to the HF service/pharmacy admitted with abdominal pain 2/2 his peritoneal mass. On coumadin pta for afib which was resumed. INR therapeutic on admission at 2.09. He missed a dose 1/15 but INR therapeutic today 2.74. Of note, he was on amiodarone 200mg  bid pta, but now just 200mg  daily.  Home coumadin dose: 4mg  daily except 2mg  TTS - last taken 1/14  Goal of Therapy:  INR 2-3 Monitor platelets by anticoagulation protocol: Yes   Plan:  1) Repeat Coumadin 2mg  x 1, consistent with home dosing. 2) INR in AM  [place] Crane, Vena [place], BCPS  Clinical Pharmacist Pager [phone]  07/08/2014 8:04 AM

## 2014-07-08 NOTE — Progress Notes (Signed)
Advanced Heart Failure Rounding Note   Subjective:    Ab pain and distension improved. BM yesterday. Weight down 5 pounds.  K remains low. Continue milrinone 0.5 mcg.  Overall feeling better. Denies SOB  CT ab shows slowly enlarging retroperitoneal mass  Creatinine 1.6>1.96 > 1.79  CO-OX 79% K 2.9    Objective:   Weight Range:  Vital Signs:   Temp:  [97.8 F (36.6 C)-98.3 F (36.8 C)] 98.3 F (36.8 C) (01/19 0300) Pulse Rate:  [69-93] 76 (01/19 0716) Resp:  [14-30] 20 (01/19 0716) BP: (75-116)/(40-78) 80/44 mmHg (01/19 0716) SpO2:  [82 %-100 %] 89 % (01/19 0716) Weight:  [144 lb 8 oz (65.545 kg)] 144 lb 8 oz (65.545 kg) (01/19 0500) Last BM Date: 07/07/14  Weight change: Filed Weights   07/06/14 0300 07/07/14 0407 07/08/14 0500  Weight: 148 lb 11.2 oz (67.45 kg) 149 lb 12.8 oz (67.949 kg) 144 lb 8 oz (65.545 kg)    Intake/Output:   Intake/Output Summary (Last 24 hours) at 07/08/14 0739 Last data filed at 07/08/14 0700  Gross per 24 hour  Intake 966.15 ml  Output   3350 ml  Net -2383.85 ml     Physical Exam: CVP 5  General: Elderly, chronically ill appearing. NAD. Sitting on the side of the bed. No resp difficulty; interpreter present, Millie RN HEENT: normal Neck: supple. JVD 5-6; Carotids 2+ bilaterally; no bruits. No lymphadenopathy or thryomegaly appreciated.  Cor: PMI laterally displaced. Regular rhythm and rate. No s3 or murmur.  Lungs: clear Abdomen: soft, nontender, mildy distended. No hepatosplenomegaly. No bruits or masses. Good bowel sounds. Extremities: no cyanosis, clubbing, rash. RLE LLE 1-2+ edmea.   Neuro: alert & orientedx3, cranial nerves grossly intact. Moves all 4 extremities w/o difficulty. Affect pleasant.  Telemetry: SR  Labs: Basic Metabolic Panel:  Recent Labs Lab 07/04/14 1937 07/05/14 0506 07/05/14 0836 07/06/14 0423 07/07/14 0500 07/08/14 0526  NA 133*  --  130* 129* 129* 129*  K 3.0*  --  2.8* 3.3* 3.1* 2.9*  CL  93*  --  90* 91* 88* 88*  CO2 28  --  33* 29 32 28  GLUCOSE 122*  --  165* 144* 143* 173*  BUN 38*  --  38* 42* 47* 53*  CREATININE 1.54*  --  1.61* 1.63* 1.96* 1.79*  CALCIUM 9.1  --  9.2 8.9 8.8 8.6  MG  --  1.9  --   --   --   --     Liver Function Tests:  Recent Labs Lab 07/04/14 1937  AST 71*  ALT 134*  ALKPHOS 167*  BILITOT 0.8  PROT 6.8  ALBUMIN 3.4*    Recent Labs Lab 07/04/14 1937  LIPASE 33   No results for input(s): AMMONIA in the last 168 hours.  CBC:  Recent Labs Lab 07/04/14 1937 07/05/14 0836 07/07/14 0500 07/08/14 0526  WBC 12.7* 14.3* 11.3* 10.6*  NEUTROABS 10.8*  --   --   --   HGB 11.6* 11.6* 10.4* 10.2*  HCT 34.0* 34.7* 31.4* 30.3*  MCV 81.7 83.2 81.3 80.8  PLT 303 306 302 280    Cardiac Enzymes:  Recent Labs Lab 07/04/14 1959  TROPONINI 0.04*    BNP: BNP (last 3 results)  Recent Labs  03/01/14 1959 03/08/14 2230 06/05/14 0332  PROBNP 11880.0* 15334.0* 19533.0*     Other results:  Imaging: No results found.   Medications:     Scheduled Medications: . amiodarone  200 mg Oral Daily  .  aspirin EC  325 mg Oral Daily  . docusate sodium  100 mg Oral BID  . magnesium oxide  400 mg Oral BID  . metolazone  5 mg Oral Daily  . pantoprazole (PROTONIX) IV  40 mg Intravenous Q24H  . polyethylene glycol  17 g Oral BID  . potassium chloride  30 mEq Oral Q4H  . senna  2 tablet Oral BID  . sildenafil  20 mg Oral TID  . sodium chloride  3 mL Intravenous Q12H  . spironolactone  25 mg Oral BID  . torsemide  80 mg Oral Daily  . Warfarin - Pharmacist Dosing Inpatient   Does not apply q1800    Infusions: . milrinone 0.5 mcg/kg/min (07/07/14 1431)    PRN Medications: sodium chloride, acetaminophen **OR** acetaminophen, alum & mag hydroxide-simeth, bisacodyl, HYDROmorphone (DILAUDID) injection, ondansetron **OR** ondansetron (ZOFRAN) IV, oxyCODONE, sodium chloride, sodium phosphate, sorbitol   Assessment:   1. Ab  distension/constipation 2. Chronic systolic HF (EF 88%) on home milrinone 3. PAH 4. CKD, stage III-IV 5. PAF in NSR on amio 6. Retroperitoneal mass. 7. Hypokalemia 8. Hyponatremia  Plan/Discussion:    CVP and co-ox stable. Will give fleet enema. CVP 5. Hold torsemide and metolazone today. Continue milrinone 0.5 mcg. Supp K+. Weight down 5 pounds. Overall down 8 pounds. Add ted hose.    Hopefully home tomorrow. soon with [facility] to resume care.   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 ? suspect biospy sample may have been inadequate. In either case he is not candidate for aggressive therapy for mass due to his end-stage HF.   Length of Stay: 4   CLEGG,[name] NP-C  07/08/2014, 7:39 AM  Advanced Heart Failure Team Pager [phone] (M-F; [place])   Please contact [facility] for night-coverage after hours (4p -7a ) and weekends on [email or web address]  Patient seen and examined with Darrick Grinder, NP. We discussed all aspects of the encounter. I agree with the assessment and plan as stated above.   Stable from HF perspective. Ab pain/constipation improved. K low. Will supp K today. Probably home tomorrow.  Djuana Littleton,MD 9:05 AM

## 2014-07-09 DIAGNOSIS — E876 Hypokalemia: Secondary | ICD-10-CM

## 2014-07-09 LAB — BASIC METABOLIC PANEL
ANION GAP: 10 (ref 5–15)
BUN: 44 mg/dL — ABNORMAL HIGH (ref 6–23)
CO2: 29 mmol/L (ref 19–32)
CREATININE: 1.48 mg/dL — AB (ref 0.50–1.35)
Calcium: 9.5 mg/dL (ref 8.4–10.5)
Chloride: 89 mEq/L — ABNORMAL LOW (ref 96–112)
GFR calc Af Amer: 50 mL/min — ABNORMAL LOW (ref >90)
GFR calc non Af Amer: 43 mL/min — ABNORMAL LOW (ref >90)
Glucose, Bld: 247 mg/dL — ABNORMAL HIGH (ref 70–99)
POTASSIUM: 4.4 mmol/L (ref 3.5–5.1)
SODIUM: 128 mmol/L — AB (ref 135–145)

## 2014-07-09 LAB — PROTIME-INR
INR: 2.81 — ABNORMAL HIGH (ref 0.00–1.49)
PROTHROMBIN TIME: 29.8 s — AB (ref 11.6–15.2)

## 2014-07-09 LAB — MAGNESIUM: Magnesium: 1.4 mg/dL — ABNORMAL LOW (ref 1.5–2.5)

## 2014-07-09 MED ORDER — POTASSIUM CHLORIDE 10 MEQ/50ML IV SOLN
10.0000 meq | INTRAVENOUS | Status: DC
  Filled 2014-07-09: qty 50

## 2014-07-09 MED ORDER — SODIUM CHLORIDE 0.9 % IV SOLN
2.0000 g | Freq: Once | INTRAVENOUS | Status: AC
  Administered 2014-07-09: 2 g via INTRAVENOUS
  Filled 2014-07-09: qty 20

## 2014-07-09 MED ORDER — TORSEMIDE 20 MG PO TABS
60.0000 mg | ORAL_TABLET | Freq: Once | ORAL | Status: AC
  Administered 2014-07-09: 60 mg via ORAL
  Filled 2014-07-09: qty 3

## 2014-07-09 MED ORDER — POTASSIUM CHLORIDE CRYS ER 20 MEQ PO TBCR
30.0000 meq | EXTENDED_RELEASE_TABLET | ORAL | Status: DC
  Administered 2014-07-09: 30 meq via ORAL
  Filled 2014-07-09 (×6): qty 1

## 2014-07-09 MED ORDER — POTASSIUM CHLORIDE CRYS ER 20 MEQ PO TBCR
40.0000 meq | EXTENDED_RELEASE_TABLET | ORAL | Status: DC
  Administered 2014-07-09 (×2): 40 meq via ORAL
  Filled 2014-07-09 (×5): qty 2

## 2014-07-09 MED ORDER — WARFARIN SODIUM 4 MG PO TABS
4.0000 mg | ORAL_TABLET | Freq: Once | ORAL | Status: AC
  Administered 2014-07-09: 4 mg via ORAL
  Filled 2014-07-09: qty 1

## 2014-07-09 MED ORDER — MAGNESIUM SULFATE 2 GM/50ML IV SOLN
2.0000 g | Freq: Once | INTRAVENOUS | Status: AC
  Administered 2014-07-09: 2 g via INTRAVENOUS
  Filled 2014-07-09: qty 50

## 2014-07-09 NOTE — Progress Notes (Signed)
[facility] PROGRESS NOTE  Tyler Christensen JJH:[id number] DOB: May 24, 1933 DOA: 07/04/2014 PCP: Harvie Junior, MD  Assessment/Plan: 81 year old Spanish-speaking male patient with PMHx significant for chronic systolic CHF (EF 81%)-EH milrinone drip, NICM, LBBB, PAF (on coumadin), HTN, HLD, CVA, severe Pulmonary Hypertension, h/o seizure d/o (2/2 CVA 05/2012), Staphylococcus bacteremia treated with IV antibiotics in May 2015, TEE negative for endocarditis, retroperitoneal mass and biopsy revealed benign lesion, stage III chronic kidney disease , chronic constipation, chronic respiratory failure on home oxygen & DNR, presented with abdominal pain and distention of 5-6 days duration, constipation, nausea and intermittent bilious/nonbloody emesis, chronic unchanged dyspnea and no chest pain. CT abdomen revealed no signs of obstruction but showed enlarging retroperitoneal mass and increased stool in the vault. No cough or fever reported. Cardiology following. Abdominal pain probably more due to constipation-improving with bowel regimen and enemas. Replacing potassium aggressively.   1. Abdominal pain: Likely secondary to constipation and less likely secondary to enlarging retroperitoneal mass. Status post enema 1 on 1/16 & 1/18 - good effect. Aggressive bowel regimen. Pain improved. Tolerating solid food/poor appetite. Other DD for pain- passive hepatic congestion from CHF. Fleet enema 1/18. 2. Chronic systolic CHF/NICM/severe pulmonary hypertension/minimally elevated troponin: Continue amiodarone, metolazone, Sildinafil, Aldactone, torsemide and milrinone. Elevated troponin likely secondary to demand ischemia. Cardiology input appreciated- Mx per Cards. - Ideal candidate for hospice- defer to Cards. 3. Hypokalemia, hypo Mg: Low despite daily replacements. Replacing aggressively today and follow BMP. 4. Stage III chronic kidney disease: Creatinine slightly better that yesterday- ? Cardiorenal/diuresis.  Follow BMP. 5. Chronic anemia: Hb dropped by 1 gm day before yesterday but stable from yesterday. FU CBC in am. 6. PAF: Currently in V Paced rythm. Anticoagulated with Coumadin-continue per pharmacy. 7. History of CVA: Anticoagulated with Coumadin. 8. Nausea and vomiting: Probably related to problem #1. Treatment for constipation. When necessary antiemetics. Resolved. 9. Essential hypertension: Soft blood pressures. Monitor closely. 10. History of Staphylococcus aureus bacteremia: Treated 11. Enlarging right-sided retroperitoneal mass: As per previous records, biopsy was benign in the past. 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 Inadequate Bx sample). Not candidate for aggressive eval or Mx d/t end stage HF. 12. Hyponatremia: related to CHF 13. Chronic hypotension: Likely related to end-stage CHF. Asymptomatic  DNR  Code Status: full Family Communication:  D/w patient (indicate person spoken with, relationship, and if by phone, the number) Disposition Plan: home 24-48 hrs    Consultants:  Cardiology   Procedures:  none  Antibiotics:  noene (indicate start date, and stop date if known)  HPI/Subjective: alert  Objective: Filed Vitals:   07/09/14 0815  BP: 106/80  Pulse:   Temp: 98 F (36.7 C)  Resp:     Intake/Output Summary (Last 24 hours) at 07/09/14 1117 Last data filed at 07/09/14 1029  Gross per 24 hour  Intake  764.6 ml  Output   1350 ml  Net -585.4 ml   Filed Weights   07/07/14 0407 07/08/14 0500 07/09/14 0405  Weight: 67.949 kg (149 lb 12.8 oz) 65.545 kg (144 lb 8 oz) 68.1 kg (150 lb 2.1 oz)    Exam:   General:  alert  Cardiovascular: s1,s2 rrr  Respiratory: few crackles LL  Abdomen: soft, nt,nd   Musculoskeletal: midl edema   Data Reviewed: Basic Metabolic Panel:  Recent Labs Lab 07/05/14 0506 07/05/14 0836 07/06/14 0423 07/07/14 0500 07/08/14 0526 07/09/14 0510  NA  --  130* 129* 129* 129* 135  K  --  2.8* 3.3*  3.1*  2.9* 2.6*  CL  --  90* 91* 88* 88* 105  CO2  --  33* 29 32 28 26  GLUCOSE  --  165* 144* 143* 173* 129*  BUN  --  38* 42* 47* 53* 37*  CREATININE  --  1.61* 1.63* 1.96* 1.79* 1.25  CALCIUM  --  9.2 8.9 8.8 8.6 6.5*  MG 1.9  --   --   --   --  1.4*   Liver Function Tests:  Recent Labs Lab 07/04/14 1937  AST 71*  ALT 134*  ALKPHOS 167*  BILITOT 0.8  PROT 6.8  ALBUMIN 3.4*    Recent Labs Lab 07/04/14 1937  LIPASE 33   No results for input(s): AMMONIA in the last 168 hours. CBC:  Recent Labs Lab 07/04/14 1937 07/05/14 0836 07/07/14 0500 07/08/14 0526  WBC 12.7* 14.3* 11.3* 10.6*  NEUTROABS 10.8*  --   --   --   HGB 11.6* 11.6* 10.4* 10.2*  HCT 34.0* 34.7* 31.4* 30.3*  MCV 81.7 83.2 81.3 80.8  PLT 303 306 302 280   Cardiac Enzymes:  Recent Labs Lab 07/04/14 1959  TROPONINI 0.04*   BNP (last 3 results)  Recent Labs  03/01/14 1959 03/08/14 2230 06/05/14 0332  PROBNP 11880.0* 15334.0* 19533.0*   CBG: No results for input(s): GLUCAP in the last 168 hours.  Recent Results (from the past 240 hour(s))  MRSA PCR Screening     Status: None   Collection Time: 07/05/14  6:09 PM  Result Value Ref Range Status   MRSA by PCR NEGATIVE NEGATIVE Final    Comment:        The GeneXpert MRSA Assay (FDA approved for NASAL specimens only), is one component of a comprehensive MRSA colonization surveillance program. It is not intended to diagnose MRSA infection nor to guide or monitor treatment for MRSA infections.      Studies: No results found.  Scheduled Meds: . amiodarone  200 mg Oral Daily  . aspirin EC  325 mg Oral Daily  . calcium gluconate  2 g Intravenous Once  . docusate sodium  100 mg Oral BID  . magnesium oxide  400 mg Oral BID  . magnesium sulfate 1 - 4 g bolus IVPB  2 g Intravenous Once  . pantoprazole (PROTONIX) IV  40 mg Intravenous Q24H  . polyethylene glycol  17 g Oral BID  . potassium chloride  40 mEq Oral Q4H  . senna  2 tablet  Oral BID  . sildenafil  20 mg Oral TID  . sodium chloride  3 mL Intravenous Q12H  . spironolactone  25 mg Oral BID  . warfarin  4 mg Oral ONCE-1800  . Warfarin - Pharmacist Dosing Inpatient   Does not apply q1800   Continuous Infusions: . milrinone 0.5 mcg/kg/min (07/09/14 0700)    Active Problems:   Acute On Chronic systolic congestive heart failure, NYHA class 2 EF 20 % 12.23.2013   HTN (hypertension)   Atrial fibrillation   HLD (hyperlipidemia)   Long-term (current) use of anticoagulants   ICD (implantable cardioverter-defibrillator), biventricular, in situ   Retroperitoneal mass   Elevated troponin   Abdominal pain   Pulmonary HTN    Time spent: >35 minutes     Kinnie Feil  [facility] Pager [phone]. If 7PM-7AM, please contact night-coverage at [email or web address], password [facility] 07/09/2014, 11:17 AM  LOS: 5 days

## 2014-07-09 NOTE — Progress Notes (Signed)
CARDIAC REHAB PHASE I   PRE:  Rate/Rhythm: 80 pacing    BP: sitting 96/55    SaO2: 95 3L  MODE:  Ambulation: 170 ft   POST:  Rate/Rhythm: 88 pacing    BP: sitting 108/48     SaO2: 86-88 3L  Pt DOE with multiple rest steps, rest every 30 ft. Sts he feels "normal" and sometimes he walks farther than this, sometimes he walks shorter distances. Return to recliner. SaO2 low on 3L. Will f/u as time permits. [place], [place], ACSM 07/09/2014 2:47 PM

## 2014-07-09 NOTE — Progress Notes (Addendum)
Advanced Heart Failure Rounding Note   Subjective:    Ab pain and distension improved. BM today.  Last night with orthopnea. Feels more SOB today. CVP only 4-5 however. K supplemented today.    Creatinine 1.6>1.96 > 1.25 CO-OX 79% K 2.6    Objective:   Weight Range:  Vital Signs:   Temp:  [97.4 F (36.3 C)-98 F (36.7 C)] 97.4 F (36.3 C) (01/20 1123) Pulse Rate:  [78-99] 78 (01/20 0405) Resp:  [13-22] 22 (01/20 0405) BP: (90-122)/(53-80) 100/53 mmHg (01/20 1123) SpO2:  [95 %-100 %] 99 % (01/20 1123) Weight:  [68.1 kg (150 lb 2.1 oz)] 68.1 kg (150 lb 2.1 oz) (01/20 0405) Last BM Date: 07/07/14  Weight change: Filed Weights   07/07/14 0407 07/08/14 0500 07/09/14 0405  Weight: 67.949 kg (149 lb 12.8 oz) 65.545 kg (144 lb 8 oz) 68.1 kg (150 lb 2.1 oz)    Intake/Output:   Intake/Output Summary (Last 24 hours) at 07/09/14 1415 Last data filed at 07/09/14 1300  Gross per 24 hour  Intake 1244.6 ml  Output   1450 ml  Net -205.4 ml     Physical Exam: CVP 4-5  General: Elderly, chronically ill appearing. NAD. Sitting in chair. No resp difficulty; interpreter present HEENT: normal Neck: supple. JVD 5-; Carotids 2+ bilaterally; no bruits. No lymphadenopathy or thryomegaly appreciated.  Cor: PMI laterally displaced. Regular rhythm and rate. No s3 or murmur.  Lungs: clear Abdomen: soft, nontender, mildy distended. No hepatosplenomegaly. No bruits or masses. Good bowel sounds. Extremities: no cyanosis, clubbing, rash. RLE LLE 1-2+ edmea.   Neuro: alert & orientedx3, cranial nerves grossly intact. Moves all 4 extremities w/o difficulty. Affect pleasant.  Telemetry: SR  Labs: Basic Metabolic Panel:  Recent Labs Lab 07/05/14 0506 07/05/14 0836 07/06/14 0423 07/07/14 0500 07/08/14 0526 07/09/14 0510  NA  --  130* 129* 129* 129* 135  K  --  2.8* 3.3* 3.1* 2.9* 2.6*  CL  --  90* 91* 88* 88* 105  CO2  --  33* 29 32 28 26  GLUCOSE  --  165* 144* 143* 173* 129*   BUN  --  38* 42* 47* 53* 37*  CREATININE  --  1.61* 1.63* 1.96* 1.79* 1.25  CALCIUM  --  9.2 8.9 8.8 8.6 6.5*  MG 1.9  --   --   --   --  1.4*    Liver Function Tests:  Recent Labs Lab 07/04/14 1937  AST 71*  ALT 134*  ALKPHOS 167*  BILITOT 0.8  PROT 6.8  ALBUMIN 3.4*    Recent Labs Lab 07/04/14 1937  LIPASE 33   No results for input(s): AMMONIA in the last 168 hours.  CBC:  Recent Labs Lab 07/04/14 1937 07/05/14 0836 07/07/14 0500 07/08/14 0526  WBC 12.7* 14.3* 11.3* 10.6*  NEUTROABS 10.8*  --   --   --   HGB 11.6* 11.6* 10.4* 10.2*  HCT 34.0* 34.7* 31.4* 30.3*  MCV 81.7 83.2 81.3 80.8  PLT 303 306 302 280    Cardiac Enzymes:  Recent Labs Lab 07/04/14 1959  TROPONINI 0.04*    BNP: BNP (last 3 results)  Recent Labs  03/01/14 1959 03/08/14 2230 06/05/14 0332  PROBNP 11880.0* 15334.0* 19533.0*     Other results:  Imaging: No results found.   Medications:     Scheduled Medications: . amiodarone  200 mg Oral Daily  . aspirin EC  325 mg Oral Daily  . docusate sodium  100 mg Oral  BID  . magnesium oxide  400 mg Oral BID  . pantoprazole (PROTONIX) IV  40 mg Intravenous Q24H  . polyethylene glycol  17 g Oral BID  . potassium chloride  40 mEq Oral Q4H  . senna  2 tablet Oral BID  . sildenafil  20 mg Oral TID  . sodium chloride  3 mL Intravenous Q12H  . spironolactone  25 mg Oral BID  . warfarin  4 mg Oral ONCE-1800  . Warfarin - Pharmacist Dosing Inpatient   Does not apply q1800    Infusions: . milrinone 0.5 mcg/kg/min (07/09/14 0700)    PRN Medications: sodium chloride, acetaminophen **OR** acetaminophen, alum & mag hydroxide-simeth, bisacodyl, HYDROmorphone (DILAUDID) injection, ondansetron **OR** ondansetron (ZOFRAN) IV, oxyCODONE, sodium chloride, sodium phosphate, sorbitol   Assessment:   1. Ab distension/constipation 2. Chronic systolic HF (EF 26%) on home milrinone 3. PAH 4. CKD, stage III-IV 5. PAF in NSR on amio 6.  Retroperitoneal mass. 7. Hypokalemia 8. Hyponatremia 9. Hypocalcemia  Plan/Discussion:    More SOB today but CVP and co-ox stable. Will restart demadex. K has been supped. Rechecking this afternoon. Supping calcium as well.   Hopefully home tomorrow. soon with [facility] to resume care. Has refused Hospice in past as [facility] services very helpful to him and dose not want to lose them.   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 ? suspect biospy sample may have been inadequate. In either case he is not candidate for aggressive therapy for mass due to his end-stage HF.   Length of Stay: 5   Glori Bickers MD  07/09/2014, 2:15 PM  Advanced Heart Failure Team Pager [phone] (M-F; 7a - 4p)

## 2014-07-09 NOTE — Progress Notes (Signed)
Pt unplugged telemetry cord and appeared very frustrated. Interpretor line used. Pt expressed dissatisfaction with RN unable to speak spanish. This RN informed pt through interpretor this was the way of communication at night. Pt denied any needs when interpretor asked pt of any pain, if he was hot or cold. Interpretor communicated to pt to point to his phone if this RN was unable to meet his needs and the interpretor line would be used.

## 2014-07-09 NOTE — Progress Notes (Signed)
CRITICAL VALUE ALERT  Critical value received:  Potassium 2.6  Date of notification:  07/09/2014  Time of notification:  7670  Critical value read back:Yes.    Nurse who received alert:  Hadassah Pais RN BSN  MD notified (1st page):  Forrest Moron NP  Time of first page:  [phone]  MD notified (2nd page):  Time of second page:  Responding MD:  Forrest Moron NP  Time MD responded:  [phone]

## 2014-07-09 NOTE — Progress Notes (Signed)
ANTICOAGULATION CONSULT NOTE - Follow Up Consult  Pharmacy Consult for Coumadin Indication: atrial fibrillation  Allergies  Allergen Reactions  . Lisinopril Rash    Patient Measurements: Height: 5\' 8"  (172.7 cm) Weight: 150 lb 2.1 oz (68.1 kg) IBW/kg (Calculated) : 68.4  Vital Signs: Temp: 98 F (36.7 C) (01/20 0815) Temp Source: Oral (01/20 0815) BP: 106/80 mmHg (01/20 0815) Pulse Rate: 78 (01/20 0405)  Labs:  Recent Labs  07/07/14 0500 07/08/14 0526 07/09/14 0510  HGB 10.4* 10.2*  --   HCT 31.4* 30.3*  --   PLT 302 280  --   LABPROT 30.0* 29.2* 29.8*  INR 2.83* 2.74* 2.81*  CREATININE 1.96* 1.79* 1.25    Estimated Creatinine Clearance: 46.2 mL/min (by C-G formula based on Cr of 1.25).  Assessment: 81yom well known to the HF service/pharmacy admitted with abdominal pain 2/2 his peritoneal mass. On coumadin pta for afib which was resumed. INR therapeutic on admission at 2.09. He missed a dose 1/15 but INR therapeutic today 2.81. Of note, he was on amiodarone 200mg  bid pta, but now just 200mg  daily.  Home coumadin dose: 4mg  daily except 2mg  TTS - last taken 1/14 PTA  Goal of Therapy:  INR 2-3 Monitor platelets by anticoagulation protocol: Yes   Plan:  1) Coumadin 4 mg x 1, consistent with home dosing. 2) INR in AM  [place] Crane, [place]ena [place], BCPS  Clinical Pharmacist Pager [phone]  07/09/2014 10:31 AM

## 2014-07-10 DIAGNOSIS — I481 Persistent atrial fibrillation: Secondary | ICD-10-CM

## 2014-07-10 LAB — PROTIME-INR
INR: 2.21 — AB (ref 0.00–1.49)
PROTHROMBIN TIME: 24.7 s — AB (ref 11.6–15.2)

## 2014-07-10 LAB — BASIC METABOLIC PANEL
Anion gap: 9 (ref 5–15)
BUN: 44 mg/dL — ABNORMAL HIGH (ref 6–23)
CALCIUM: 8.9 mg/dL (ref 8.4–10.5)
CO2: 32 mmol/L (ref 19–32)
CREATININE: 1.51 mg/dL — AB (ref 0.50–1.35)
Chloride: 90 mEq/L — ABNORMAL LOW (ref 96–112)
GFR, EST AFRICAN AMERICAN: 49 mL/min — AB (ref >90)
GFR, EST NON AFRICAN AMERICAN: 42 mL/min — AB (ref >90)
Glucose, Bld: 124 mg/dL — ABNORMAL HIGH (ref 70–99)
Potassium: 3.8 mmol/L (ref 3.5–5.1)
SODIUM: 131 mmol/L — AB (ref 135–145)

## 2014-07-10 MED ORDER — TORSEMIDE 20 MG PO TABS
60.0000 mg | ORAL_TABLET | Freq: Every day | ORAL | Status: DC
  Administered 2014-07-10: 60 mg via ORAL
  Filled 2014-07-10: qty 3

## 2014-07-10 MED ORDER — POTASSIUM CHLORIDE CRYS ER 20 MEQ PO TBCR: 20.0000 meq | EXTENDED_RELEASE_TABLET | Freq: Every day | ORAL | Status: DC

## 2014-07-10 MED ORDER — ACETAMINOPHEN 325 MG PO TABS: 650.0000 mg | ORAL_TABLET | Freq: Four times a day (QID) | ORAL | Status: AC | PRN

## 2014-07-10 MED ORDER — PANTOPRAZOLE SODIUM 40 MG PO TBEC
40.0000 mg | DELAYED_RELEASE_TABLET | Freq: Every day | ORAL | Status: DC
  Administered 2014-07-10: 40 mg via ORAL
  Filled 2014-07-10: qty 1

## 2014-07-10 MED ORDER — WARFARIN SODIUM 4 MG PO TABS
4.0000 mg | ORAL_TABLET | Freq: Once | ORAL | Status: DC
  Filled 2014-07-10: qty 1

## 2014-07-10 MED ORDER — SENNA 8.6 MG PO TABS: 2.0000 | ORAL_TABLET | Freq: Two times a day (BID) | ORAL | Status: DC

## 2014-07-10 MED ORDER — POTASSIUM CHLORIDE CRYS ER 20 MEQ PO TBCR
20.0000 meq | EXTENDED_RELEASE_TABLET | Freq: Every day | ORAL | Status: DC
  Administered 2014-07-10: 20 meq via ORAL

## 2014-07-10 MED ORDER — AMIODARONE HCL 200 MG PO TABS: 200.0000 mg | ORAL_TABLET | Freq: Every day | ORAL | Status: DC

## 2014-07-10 NOTE — Progress Notes (Signed)
ANTICOAGULATION CONSULT NOTE - Follow Up Consult  Pharmacy Consult for Coumadin Indication: atrial fibrillation  Allergies  Allergen Reactions  . Lisinopril Rash    Patient Measurements: Height: 5\' 8"  (172.7 cm) Weight: 148 lb 9.4 oz (67.4 kg) IBW/kg (Calculated) : 68.4  Vital Signs: Temp: 98.6 F (37 C) (01/21 0335) Temp Source: Oral (01/21 0335) BP: 89/38 mmHg (01/21 0348)  Labs:  Recent Labs  07/08/14 0526 07/09/14 0510 07/09/14 1342 07/10/14 0415  HGB 10.2*  --   --   --   HCT 30.3*  --   --   --   PLT 280  --   --   --   LABPROT 29.2* 29.8*  --  24.7*  INR 2.74* 2.81*  --  2.21*  CREATININE 1.79* 1.25 1.48* 1.51*    Estimated Creatinine Clearance: 37.8 mL/min (by C-G formula based on Cr of 1.51).  Assessment: Tyler Christensen well known to the HF service/pharmacy admitted with abdominal pain 2/2 his peritoneal mass. On coumadin pta for afib which was resumed. INR therapeutic on admission at 2.09. He missed a dose 1/15 but INR therapeutic today 2.21. Of note, he was on amiodarone 200mg  bid pta, but now just 200mg  daily.  Home coumadin dose: 4mg  daily except 2mg  TTS - last taken 1/14 PTA  Goal of Therapy:  INR 2-3 Monitor platelets by anticoagulation protocol: Yes   Plan:  1) Coumadin 4 mg x 1, at discharge can likely resume previous home dose. 2) INR in AM  [place] Crane, Vena [place], BCPS  Clinical Pharmacist Pager [phone]  07/10/2014 8:16 AM

## 2014-07-10 NOTE — Consult Note (Signed)
Referral received for [facility] Care Management to continue community support.  Patient was previously active with [facility] Nurse and progressed fairly well with ongoing support from [facility]. Patient is also active with the [facility] Clinic.  Met with the patient at bedside and communication through [facility] at  [phone]  ID# [phone] for [place].  Patient states, "I will do whatever my doctors ask me too and I will continue to work with your nurse as well but, I have [name] [facility] as my nurse that comes and see me and give me medicine. But, I will do whatever is best for me."  Explained the benefits of community care management and for disease management. Patient agreed to the restart of services but wish to have the information in the packet in [place].  [facility] care management can deliver the information to his home.  Patient's telephone contacts confirmed.  [facility] will provide post hospital telephone calls and assess for home visits. Of note, [facility] Care management does not replace or interfere with any services arranged for this patient.  For questions, please call Natividad Brood, RN, BSN, [facility] Liaison at [phone].

## 2014-07-10 NOTE — Discharge Instructions (Signed)
Insuficiencia cardaca  (Heart Failure)  Insuficiencia cardaca significa que el corazn tiene problemas para bombear la [place]. Esto dificulta el buen funcionamiento del organismo. La insuficiencia cardaca es una enfermedad de larga duracin (crnica). Es importante que se cuide mucho y que siga el plan de tratamiento que le indique el mdico. CUIDADOS EN EL [employer] medicamentos para el corazn tal como se los prescribi el mdico.  No deje de tomar medicamentos excepto que su mdico lo indique  No se saltee ninguna dosis del medicamento.  Provase de los medicamentos antes de que se acaben.  Tome medicamentos slo como lo indique su mdico o Development worker, international aid.  Permanezca activo si el mdico lo indica. Las Financial trader y los que tengan insuficiencia cardaca grave deben hablar con el mdico acerca de la actividad fsica.  Consuma alimentos saludables para el corazn. Elija alimentos que no contengan grasas trans y sean bajas en grasas saturadas, colesterol y sal (sodio). Esto incluye frutas frescas o congeladas y vegetales, pescado, carnes [place], productos lcteos sin grasa o bajos en grasa, granos enteros y alimentos ricos en fibra. Las lentejas, arvejas y frijoles (legumbres) son tambin buenas opciones.  Limitar el consumo de sal segn lo aconsejado por su mdico.  Cocine en forma saludable. Prepare los alimentos asados, a la parrilla, al horno, hervidos, al vapor o salteados.  Limite los lquidos segn lo aconsejado por su mdico.  Controle su peso todas las [place]. Hgalo despus de hacer pis (orinar) y antes de tomar el desayuno. Anote su peso para llevarlo a la consulta con el mdico.  Tmese la presin arterial y antela, si su mdico se lo indica.  Pregunte al mdico como controlarse el pulso. Controle su pulso segn las indicaciones.  Baje de peso si el mdico se lo indica.  Deje de fumar o mascar tabaco. No use goma de Higher education careers adviser o parches para dejar de fumar sin  la aprobacin de su mdico.  Programe y concurra a las citas con el mdico segn lo indicado.  Las mujeres no embarazadas no deben tomar ms de 1 bebida al [employer]. Los hombres no deben tomar ms de 2 bebidas al [employer]. Hable con su mdico acerca de su consumo de alcohol.  No consuma drogas.  [place] (immunizaciones).  Controle sus enfermedades segn lo indicado por su mdico.  Aprenda a Engineer, maintenance (IT).  Descanse cuando se sienta cansado.  Si hace mucho calor en el exterior:  Evite las actividades intensas.  Utilice aire acondicionado o ventiladores, o pngase en un lugar ms fresco.  Evite la cafena y el alcohol.  Use ropa holgada, ligera y de colores claros.  Si hace mucho fro en el exterior:  Evite las actividades intensas.  Vstase con ropa en capas.  Use mitones o guantes, un sombrero y [place] bufanda cuando salga.  Evite el alcohol.  Aprenda todo sobre la insuficiencia cardaca y [place] apoyo si lo necesita.  Obtenga ayuda para mantener o mejorar su calidad de vida y su capacidad para cuidarse a s mismo, si lo necesita. SOLICITE AYUDA SI:   Aumenta 3 libras (1,4 kg) o ms en un da o 5 libras (2,3 Kg) en una semana.  Le falta el aire ms que lo habitual.  No puede hacer sus actividades habituales.  Se cansa con facilidad.  Tose ms de lo normal, especialmente al realizar actividad fsica.  Observa que se le hinchan o le aumenta la hinchazn (inflamacin) en reas como las [place],  los pies, los tobillos o el vientre (abdomen).  Le cuesta dormir debido a que Film/video editor.  Siente que el corazn palpita rpido (palpitaciones).  Siente mareos o vahdos al pararse. SOLICITE AYUDA DE INMEDIATO SI:   Tiene dificultad para respirar.  Hay un cambio en su estado mental, como estar menos alerta o no poder concentrarse.  Siente dolor u opresin en el pecho.  Se desmaya. ASEGRESE DE QUE:   Comprende estas  instrucciones.  Controlar su enfermedad.  Solicitar ayuda de inmediato si no mejora o si empeora. Document Released: 09/02/2008 Document Revised: 10/01/2012 [facility] Patient Information 2015 [place], [place]. This information is not intended to replace advice given to you by your health care provider. Make sure you discuss any questions you have with your health care provider.

## 2014-07-10 NOTE — Discharge Summary (Signed)
Physician Discharge Summary  Tyler Christensen OVF:[id number] DOB: 02-May-1933 DOA: 07/04/2014  PCP: Harvie Junior, MD  Admit date: 07/04/2014 Discharge date: 07/10/2014  Time spent: >35 minutes  Recommendations for Outpatient Follow-up:  F/u with heart failure as scheduled Home care to resume  Discharge Diagnoses:  Active Problems:   Acute On Chronic systolic congestive heart failure, NYHA class 2 EF 20 % 12.23.2013   HTN (hypertension)   Atrial fibrillation   HLD (hyperlipidemia)   Long-term (current) use of anticoagulants   ICD (implantable cardioverter-defibrillator), biventricular, in situ   Retroperitoneal mass   Elevated troponin   Abdominal pain   Pulmonary HTN   Discharge Condition: stable   Diet recommendation: low sodium   Filed Weights   07/08/14 0500 07/09/14 0405 07/10/14 0346  Weight: 65.545 kg (144 lb 8 oz) 68.1 kg (150 lb 2.1 oz) 67.4 kg (148 lb 9.4 oz)    History of present illness:  81 year old Spanish-speaking male patient with PMHx significant for chronic systolic CHF (EF 84%)-ZY milrinone drip, NICM, LBBB, PAF (on coumadin), HTN, HLD, CVA, severe Pulmonary Hypertension, h/o seizure d/o (2/2 CVA 05/2012), Staphylococcus bacteremia treated with IV antibiotics in May 2015, TEE negative for endocarditis, retroperitoneal mass and biopsy revealed benign lesion, stage III chronic kidney disease , chronic constipation, chronic respiratory failure on home oxygen & DNR, presented with abdominal pain and distention of 5-6 days duration, constipation, nausea and intermittent bilious/nonbloody emesis, chronic unchanged dyspnea and no chest pain. CT abdomen revealed no signs of obstruction but showed enlarging retroperitoneal mass and increased stool in the vault. No cough or fever reported. Cardiology following. Abdominal pain probably more due to constipation-improving with bowel regimen and enemas. Replacing potassium aggressively.    Hospital Course:  1. Abdominal pain:  Likely secondary to constipation and less likely secondary to enlarging retroperitoneal mass. Status post enema 1 on 1/16 & 1/18 - good effect. Aggressive bowel regimen. Pain improved. Tolerating solid food/poor appetite. Other DD for pain- passive hepatic congestion from CHF. 2. Chronic systolic CHF/NICM/severe pulmonary hypertension/minimally elevated troponin: Continue amiodarone (decreased to 200 mg per cards), Sildinafil, Aldactone, torsemide and milrinone. Elevated troponin likely secondary to demand ischemia. Cardiology input appreciated- Mx per Cards. - Ideal candidate for hospice, but patient refused. 3. Hypokalemia, hypo Mg: cont PO replacement, recheck labs with cards on the next appointment  4. Stage III chronic kidney disease: Creatinine slightly better that yesterday- ? Cardiorenal/diuresis. Follow BMP. 5. Chronic anemia: stable  6. PAF: Currently in V Paced rythm. Anticoagulated with Coumadin 7. History of CVA: Anticoagulated with Coumadin. 8. Nausea and vomiting: Probably related to problem #1. Treatment for constipation. When necessary antiemetics. Resolved. 9. Essential hypertension: soft BP likely chronic  10. History of Staphylococcus aureus bacteremia: Treated 11. Enlarging right-sided retroperitoneal mass: As per previous records, biopsy was benign in the past. 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 Inadequate Bx sample). Not candidate for aggressive eval or Mx d/t end stage HF. 12. Hyponatremia: related to CHF 13. Chronic hypotension: Likely related to end-stage CHF. Asymptomatic  DNR  Patient is being d/c home in stable condition   Procedures:  none (i.e. Studies not automatically included, echos, thoracentesis, etc; not x-rays)  Consultations:  Cardiology   Discharge Exam: Filed Vitals:   07/10/14 1000  BP: 98/67  Pulse:   Temp:   Resp: 20    General: alert Cardiovascular: s1,s2 rrr Respiratory: CTA BL  Discharge  Instructions  Discharge Instructions    Diet - low sodium heart healthy  Complete by:  As directed      Discharge instructions    Complete by:  As directed   Please follow up with heart failure team as scheduled     Increase activity slowly    Complete by:  As directed             Medication List    STOP taking these medications        metolazone 5 MG tablet  Commonly known as:  ZAROXOLYN      TAKE these medications        acetaminophen 325 MG tablet  Commonly known as:  TYLENOL  Take 2 tablets (650 mg total) by mouth every 6 (six) hours as needed for mild pain (or Fever >/= 101).     amiodarone 200 MG tablet  Commonly known as:  PACERONE  Take 1 tablet (200 mg total) by mouth daily.     bisacodyl 5 MG EC tablet  Commonly known as:  bisacodyl  Take 1 tablet (5 mg total) by mouth daily as needed for moderate constipation.     docusate sodium 100 MG capsule  Commonly known as:  COLACE  Take 1 capsule (100 mg total) by mouth 2 (two) times daily.     magnesium oxide 400 MG tablet  Commonly known as:  MAG-OX  Take 1 tablet (400 mg total) by mouth 2 (two) times daily.     milrinone 20 MG/100ML Soln infusion  Commonly known as:  PRIMACOR  Inject 33.95 mcg/min into the vein continuous.     potassium chloride SA 20 MEQ tablet  Commonly known as:  K-DUR,KLOR-CON  Take 1 tablet (20 mEq total) by mouth daily.     senna 8.6 MG Tabs tablet  Commonly known as:  SENOKOT  Take 2 tablets (17.2 mg total) by mouth 2 (two) times daily.     sildenafil 20 MG tablet  Commonly known as:  REVATIO  Take 1 tablet (20 mg total) by mouth 3 (three) times daily.     sorbitol 70 % Soln  Take 30 mLs by mouth daily as needed for moderate constipation.     spironolactone 25 MG tablet  Commonly known as:  ALDACTONE  Take 1 tablet (25 mg total) by mouth 2 (two) times daily.     torsemide 20 MG tablet  Commonly known as:  DEMADEX  Take 4 tablets (80 mg total) by mouth daily.      traMADol 50 MG tablet  Commonly known as:  ULTRAM  TAKE 1 TABLET BY MOUTH TWICE DAILY AS NEEDED FOR PAIN     warfarin 4 MG tablet  Commonly known as:  COUMADIN  Take 4 mg Monday, Wednesday, Friday and Sunday. 2 mg on Tues, Thurs and Sat       Allergies  Allergen Reactions  . Lisinopril Rash       Follow-up Information    Follow up with Glori Bickers, MD On 07/17/2014.   Specialty:  Cardiology   Why:  at 11:40 [place] information:   [address] [place] [address] [phone]        The results of significant diagnostics from this hospitalization (including imaging, microbiology, ancillary and laboratory) are listed below for reference.    Significant Diagnostic Studies: Ct Abdomen Pelvis W Contrast  07/05/2014   CLINICAL DATA:  Acute onset of generalized abdominal pain and constipation. Abdominal swelling. Initial encounter.  EXAM: CT ABDOMEN AND PELVIS WITH CONTRAST  TECHNIQUE: Multidetector CT imaging of the abdomen and pelvis was performed using the standard protocol following bolus administration of intravenous contrast.  CONTRAST:  80 mL of Omnipaque 300 IV contrast  COMPARISON:  CT of the abdomen and pelvis from 05/31/2014  FINDINGS: Mild bibasilar airspace opacities may reflect atelectasis or possibly mild pneumonia. Cardiomegaly is noted. Pacemaker/AICD leads are partially imaged. Diffuse coronary artery calcifications are seen.  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 It has increased in size from May of 2015, at which time it measured approximately 4.8 cm, though it has changed only minimally from the recent prior study. It measures approximately 6.3 x 4.9 x 3.0 cm.  Numerous calcified granulomata are seen within the spleen. The liver is unremarkable in appearance. The gallbladder is grossly unremarkable, with minimally increased  attenuation possibly reflecting vicarious excretion of contrast from a prior study. The pancreas and adrenal glands are grossly unremarkable.  Scattered bilateral renal cysts measure up to 5.3 cm in size. Nonspecific perinephric stranding and fluid are seen. The kidneys are otherwise grossly unremarkable. There is no evidence of hydronephrosis. No renal or ureteral stones are seen.  No free fluid is identified. The small bowel is unremarkable in appearance. The stomach is within normal limits. No acute vascular abnormalities are seen. Relatively diffuse calcification is seen along the abdominal aorta and its branches.  The appendix is normal in caliber, without evidence of appendicitis. The cecum is flipped anteriorly. Scattered diverticulosis is noted along the descending and proximal sigmoid colon. The sigmoid colon is somewhat redundant. The rectum is partially distended with stool, measuring 6.5 cm in transverse dimension.  The bladder is mildly distended and grossly unremarkable. The prostate is borderline enlarged, measuring 4.8 cm in transverse dimension. No inguinal lymphadenopathy is seen.  No acute osseous abnormalities are identified.  IMPRESSION: 1. Mild bibasilar airspace opacities may reflect atelectasis or possibly mild pneumonia. 2. 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 It has increased in size from 4.8 cm in May 2015, now measuring 6.3 x 4.9 x 3.0 cm. This is only minimally changed in size from the recent prior study. Would correlate clinically as to whether this has been formally diagnosed. 3. Scattered bilateral renal cysts noted. 4. Cardiomegaly noted.  Diffuse coronary artery calcifications seen. 5. Relatively diffuse calcification along the abdominal aorta and its branches. 6. Scattered diverticulosis along the descending and proximal sigmoid colon. Rectum partially distended with stool, measuring 6.5 cm in  transverse dimension. 7. Borderline enlarged prostate.  These results were called by telephone at the time of interpretation on 07/05/2014 at 1:55 am to Dr. Christy Gentles, who verbally acknowledged these results.   Electronically Signed   By: Garald Balding M.D.   On: 07/05/2014 01:57   Dg Chest Portable 1 View  07/05/2014   CLINICAL DATA:  Port-A-Cath in place.  Vomiting for 4 days.  EXAM: PORTABLE CHEST - 1 VIEW  COMPARISON:  06/05/2014  FINDINGS: Tip of the right central line in the atrial caval junction. Multi lead left-sided pacemaker remains in place. Lower lung volumes from prior. Equivocal increase in cardiomegaly versus differences in technique. There is worsening vascular congestion and pulmonary edema. Questionable blunting of right costophrenic angle effusion. No pneumothorax.  IMPRESSION: 1. Tip of the right central line at the atrial caval junction. 2. Findings consistent with worsening CHF.   Electronically Signed   By: Threasa Beards  Ehinger M.D.   On: 07/05/2014 00:12    Microbiology: Recent Results (from the past 240 hour(s))  MRSA PCR Screening     Status: None   Collection Time: 07/05/14  6:09 PM  Result Value Ref Range Status   MRSA by PCR NEGATIVE NEGATIVE Final    Comment:        The GeneXpert MRSA Assay (FDA approved for NASAL specimens only), is one component of a comprehensive MRSA colonization surveillance program. It is not intended to diagnose MRSA infection nor to guide or monitor treatment for MRSA infections.      Labs: Basic Metabolic Panel:  Recent Labs Lab 07/05/14 0506  07/07/14 0500 07/08/14 0526 07/09/14 0510 07/09/14 1342 07/10/14 0415  NA  --   < > 129* 129* 135 128* 131*  K  --   < > 3.1* 2.9* 2.6* 4.4 3.8  CL  --   < > 88* 88* 105 89* 90*  CO2  --   < > 32 28 26 29  32  GLUCOSE  --   < > 143* 173* 129* 247* 124*  BUN  --   < > 47* 53* 37* 44* 44*  CREATININE  --   < > 1.96* 1.79* 1.25 1.48* 1.51*  CALCIUM  --   < > 8.8 8.6 6.5* 9.5 8.9  MG 1.9   --   --   --  1.4*  --   --   < > = values in this interval not displayed. Liver Function Tests:  Recent Labs Lab 07/04/14 1937  AST 71*  ALT 134*  ALKPHOS 167*  BILITOT 0.8  PROT 6.8  ALBUMIN 3.4*    Recent Labs Lab 07/04/14 1937  LIPASE 33   No results for input(s): AMMONIA in the last 168 hours. CBC:  Recent Labs Lab 07/04/14 1937 07/05/14 0836 07/07/14 0500 07/08/14 0526  WBC 12.7* 14.3* 11.3* 10.6*  NEUTROABS 10.8*  --   --   --   HGB 11.6* 11.6* 10.4* 10.2*  HCT 34.0* 34.7* 31.4* 30.3*  MCV 81.7 83.2 81.3 80.8  PLT 303 306 302 280   Cardiac Enzymes:  Recent Labs Lab 07/04/14 1959  TROPONINI 0.04*   BNP: BNP (last 3 results)  Recent Labs  03/01/14 1959 03/08/14 2230 06/05/14 0332  PROBNP 11880.0* 15334.0* 19533.0*   CBG: No results for input(s): GLUCAP in the last 168 hours.     SignedKinnie Feil  [facility] 07/10/2014, 11:24 AM

## 2014-07-10 NOTE — Progress Notes (Addendum)
RN contacted by [facility] to arrange for family to bring patient's home IV pump and O2 to hospital for his impending discharge.  RN (via interpreter) asked patient to have family bring necessary equipment.  Patient said his sons will be at hospital to pick him up and they have to get time off work to do so and he will not have them go to his home to get equipment, "they are not my slaves."  RN then contacted patient's home health RN, [name], and she volunteered to go to patient's home to get pump, if someone was there to let her in.  Patient became quite upset at this suggestion and demanded that this nurse be relieved of her duty as his Therapist, sports.  Agricultural consultant and AD made aware of situation.  Patient medically ready for DC and orders for DC received.  [facility] RN and home RN to collaborate on a solution. Tyler Christensen, Ardeth Sportsman  Patient's son brought patient's IV pump from home.

## 2014-07-10 NOTE — Progress Notes (Signed)
Physical Therapy Treatment Patient Details Name: Tyler Christensen MRN: [id number] DOB: [birth year] Today's Date: 07/10/2014    History of Present Illness Pt adm with abdominal pain and heart failure. Pt also with retroperitoneal mass. Abdominal pain likely due to constipation. PMH - systolic CHF (EF 37%), NICM, LBBB, PAF (on coumadin), HTN, HLD, CVA, severe Pulmonary Hypertension, h/o seizure d/o (2/2 CVA 05/2012    PT Comments    Patient progressing with activity tolerance and mobility. Patient ambulated on room air with 3 standing rest breaks and oxygen saturations >90%.  Patient also able to perform various transfers (increased effort to get up from toilet). Designer, television/film set used to communicate by nursing at start of session.   Follow Up Recommendations  No PT follow up     Equipment Recommendations  None recommended by PT    Recommendations for Other Services       Precautions / Restrictions Precautions Precautions: Fall    Mobility  Bed Mobility Overal bed mobility: Modified Independent             General bed mobility comments: increased time to perform  Transfers Overall transfer level: Needs assistance Equipment used: None Transfers: Sit to/from Stand Sit to Stand: Supervision         General transfer comment: Increased effort to get up from the toilet, no difficulty getting up from the bed  Ambulation/Gait Ambulation/Gait assistance: Supervision Ambulation Distance (Feet): 200 Feet Assistive device: None (IV pole) Gait Pattern/deviations: Step-through pattern;Decreased step length - right;Decreased step length - left;Trunk flexed     General Gait Details: Ambualted on room air with 3 standing rest breaks, SpO2 spot checked during ambulation, >90% on room air.    Stairs            Wheelchair Mobility    Modified Rankin (Stroke Patients Only)       Balance Overall balance assessment: Needs assistance Sitting-balance support: No upper  extremity supported Sitting balance-Leahy Scale: Normal     Standing balance support: No upper extremity supported Standing balance-Leahy Scale: Fair                      Cognition Arousal/Alertness: Awake/alert Behavior During Therapy: WFL for tasks assessed/performed Overall Cognitive Status: Within Functional Limits for tasks assessed                      Exercises      General Comments General comments (skin integrity, edema, etc.): patient able to perform self care tasks, hygiene, multiple transfers from various surfaces.        Pertinent Vitals/Pain Pain Assessment: No/denies pain    Home Living                      Prior Function            PT Goals (current goals can now be found in the care plan section) Acute Rehab PT Goals Patient Stated Goal: return home PT Goal Formulation: With patient Time For Goal Achievement: 07/15/14 Potential to Achieve Goals: Good Progress towards PT goals: Progressing toward goals    Frequency  Min 3X/week    PT Plan Current plan remains appropriate    Co-evaluation             End of Session   Activity Tolerance: Patient tolerated treatment well Patient left: in chair;with call bell/phone within reach    Time: 0927-0953 PT Time Calculation (min) (ACUTE ONLY): 26 min  Charges:  $Gait Training: 8-22 mins $Therapeutic Activity: 8-22 mins                    G CodesDuncan Dull 2014/08/07, 10:05 AM Alben Deeds, PT DPT  [phone]

## 2014-07-10 NOTE — Progress Notes (Addendum)
DC orders received.  Patient stable with no S/S of distress.  Via interpreter, medication and discharge information reviewed with patient.  Patient awaiting [place] RN to hook up to home pump and then patient to be [place] with son. Thekla Colborn, Ardeth Sportsman  Pt D/C'd home with son.

## 2014-07-10 NOTE — Progress Notes (Signed)
Advanced Heart Failure Rounding Note   Subjective:    Ab pain and distension improved. BM today.  Yesterday he received 60 mg po torsemide. Weight down 2 pounds.   Overall feeling much better. Denies SOB.   Creatinine 1.5  K 3.8   Objective:   Weight Range:  Vital Signs:   Temp:  [97.4 F (36.3 C)-98.6 F (37 C)] 98.6 F (37 C) (01/21 0335) Pulse Rate:  [78] 78 (01/20 1600) Resp:  [17-38] 18 (01/21 0600) BP: (83-106)/(35-80) 89/38 mmHg (01/21 0348) SpO2:  [91 %-99 %] 95 % (01/21 0348) Weight:  [148 lb 9.4 oz (67.4 kg)] 148 lb 9.4 oz (67.4 kg) (01/21 0346) Last BM Date: 07/07/14  Weight change: Filed Weights   07/08/14 0500 07/09/14 0405 07/10/14 0346  Weight: 144 lb 8 oz (65.545 kg) 150 lb 2.1 oz (68.1 kg) 148 lb 9.4 oz (67.4 kg)    Intake/Output:   Intake/Output Summary (Last 24 hours) at 07/10/14 0732 Last data filed at 07/10/14 0600  Gross per 24 hour  Intake 1704.6 ml  Output   2175 ml  Net -470.4 ml     Physical Exam: CVP 6  General: Elderly, chronically ill appearing. NAD. Sitting in chair. No resp difficulty; interpreter present HEENT: normal Neck: supple. JVD 5-; Carotids 2+ bilaterally; no bruits. No lymphadenopathy or thryomegaly appreciated.  Cor: PMI laterally displaced. Regular rhythm and rate. No s3 or murmur.  Lungs: clear Abdomen: soft, nontender, mildy distended. No hepatosplenomegaly. No bruits or masses. Good bowel sounds. Extremities: no cyanosis, clubbing, rash. RLE LLE trace edema.   Neuro: alert & orientedx3, cranial nerves grossly intact. Moves all 4 extremities w/o difficulty. Affect pleasant.  Telemetry: SR  Labs: Basic Metabolic Panel:  Recent Labs Lab 07/05/14 0506  07/07/14 0500 07/08/14 0526 07/09/14 0510 07/09/14 1342 07/10/14 0415  NA  --   < > 129* 129* 135 128* 131*  K  --   < > 3.1* 2.9* 2.6* 4.4 3.8  CL  --   < > 88* 88* 105 89* 90*  CO2  --   < > 32 28 26 29  32  GLUCOSE  --   < > 143* 173* 129* 247*  124*  BUN  --   < > 47* 53* 37* 44* 44*  CREATININE  --   < > 1.96* 1.79* 1.25 1.48* 1.51*  CALCIUM  --   < > 8.8 8.6 6.5* 9.5 8.9  MG 1.9  --   --   --  1.4*  --   --   < > = values in this interval not displayed.  Liver Function Tests:  Recent Labs Lab 07/04/14 1937  AST 71*  ALT 134*  ALKPHOS 167*  BILITOT 0.8  PROT 6.8  ALBUMIN 3.4*    Recent Labs Lab 07/04/14 1937  LIPASE 33   No results for input(s): AMMONIA in the last 168 hours.  CBC:  Recent Labs Lab 07/04/14 1937 07/05/14 0836 07/07/14 0500 07/08/14 0526  WBC 12.7* 14.3* 11.3* 10.6*  NEUTROABS 10.8*  --   --   --   HGB 11.6* 11.6* 10.4* 10.2*  HCT 34.0* 34.7* 31.4* 30.3*  MCV 81.7 83.2 81.3 80.8  PLT 303 306 302 280    Cardiac Enzymes:  Recent Labs Lab 07/04/14 1959  TROPONINI 0.04*    BNP: BNP (last 3 results)  Recent Labs  03/01/14 1959 03/08/14 2230 06/05/14 0332  PROBNP 11880.0* 15334.0* 19533.0*     Other results:  Imaging: No  results found.   Medications:     Scheduled Medications: . amiodarone  200 mg Oral Daily  . aspirin EC  325 mg Oral Daily  . docusate sodium  100 mg Oral BID  . magnesium oxide  400 mg Oral BID  . pantoprazole (PROTONIX) IV  40 mg Intravenous Q24H  . polyethylene glycol  17 g Oral BID  . senna  2 tablet Oral BID  . sildenafil  20 mg Oral TID  . sodium chloride  3 mL Intravenous Q12H  . spironolactone  25 mg Oral BID  . Warfarin - Pharmacist Dosing Inpatient   Does not apply q1800    Infusions: . milrinone 0.5 mcg/kg/min (07/10/14 0415)    PRN Medications: sodium chloride, acetaminophen **OR** acetaminophen, alum & mag hydroxide-simeth, bisacodyl, HYDROmorphone (DILAUDID) injection, ondansetron **OR** ondansetron (ZOFRAN) IV, oxyCODONE, sodium chloride, sodium phosphate, sorbitol   Assessment:   1. Ab distension/constipation 2. Chronic systolic HF (EF 78%) on home milrinone 3. PAH 4. CKD, stage III-IV 5. PAF in NSR on amio 6.  Retroperitoneal mass. 7. Hypokalemia 8. Hyponatremia 9. Hypocalcemia  Plan/Discussion:    Volume status improved. Give 60 mg torsemide daily then tomorrow restart 80 mg torsemide. Continue spiro.   Abdominal pain resolved.   Home today with [facility] to resume care. Has refused Hospice in past as [facility] services very helpful to him and dose not want to lose them.   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 ? suspect biospy sample may have been inadequate. In either case he is not candidate for aggressive therapy for mass due to his end-stage HF.    D/C Meds Torsemide 80 mg daily begin 07/11/14 Spironolactone 25 mg twice a day Potassium 20 meq daily Milrinone 0.5 mcg Amiodarone 200 mg daily.  Coumadin 4 mg Mon-Wed-Fri-Sun Coumadin 2 mg Tue-Thur-Sat  Length of Stay: 6   CLEGG,[name] NP-C  07/10/2014, 7:32 AM  Advanced Heart Failure Team Pager [phone] (M-F; 7a - 4p)    Patient seen and examined with Darrick Grinder, NP. We discussed all aspects of the encounter. I agree with the assessment and plan as stated above.  Looks good ok to go home today. Resume previous home meds.  Will see in HF Clinic next week. Appreciate [facility].   Neesa Knapik,MD 8:07 AM

## 2014-07-12 LAB — BASIC METABOLIC PANEL
Anion gap: 4 — ABNORMAL LOW (ref 5–15)
BUN: 37 mg/dL — ABNORMAL HIGH (ref 6–23)
CO2: 26 mmol/L (ref 19–32)
Calcium: 6.5 mg/dL — ABNORMAL LOW (ref 8.4–10.5)
Chloride: 105 mmol/L (ref 96–112)
Creatinine, Ser: 1.25 mg/dL (ref 0.50–1.35)
GFR calc Af Amer: 61 mL/min — ABNORMAL LOW (ref >90)
GFR calc non Af Amer: 53 mL/min — ABNORMAL LOW (ref >90)
Glucose, Bld: 129 mg/dL — ABNORMAL HIGH (ref 70–99)
Potassium: 2.6 mmol/L — CL (ref 3.5–5.1)
Sodium: 135 mmol/L (ref 135–145)

## 2014-07-14 ENCOUNTER — Encounter: Payer: Self-pay | Admitting: Internal Medicine

## 2014-07-15 ENCOUNTER — Telehealth (HOSPITAL_COMMUNITY): Payer: Self-pay

## 2014-07-15 ENCOUNTER — Ambulatory Visit (INDEPENDENT_AMBULATORY_CARE_PROVIDER_SITE_OTHER): Payer: Medicare Other | Admitting: Pharmacist Clinician (PhC)/ Clinical Pharmacy Specialist

## 2014-07-15 DIAGNOSIS — I481 Persistent atrial fibrillation: Secondary | ICD-10-CM

## 2014-07-15 DIAGNOSIS — I4819 Other persistent atrial fibrillation: Secondary | ICD-10-CM

## 2014-07-15 LAB — POCT INR: INR: 2.5

## 2014-07-15 NOTE — Telephone Encounter (Signed)
Spoke with [facility] RN [name] about patient since discharge.  States she went to visit him at home and seemed to be in a hurry to leave to run errands with family.  Also believes he is again noncompliant with potassium as it was started back at discharge from hospital (was previously Floyd for noncompliance and Arlyce Harman was increased instead).  Will reinforce importance of compliance and stable potassium serum level.  Potassium today was down to 2.9 (was 3.8 5 days ago at discharge).  Patient to be seen in our clinic this Thursday.  Renee Pain

## 2014-07-17 ENCOUNTER — Ambulatory Visit (HOSPITAL_COMMUNITY)
Admit: 2014-07-17 | Discharge: 2014-07-17 | Disposition: A | Discharge status: Home / Self Care | Payer: Medicare Other | Source: Ambulatory Visit | Attending: Internal Medicine | Admitting: Internal Medicine

## 2014-07-17 VITALS — BP 92/42 | HR 75 | Wt 149.0 lb

## 2014-07-17 DIAGNOSIS — I5022 Chronic systolic (congestive) heart failure: Secondary | ICD-10-CM | POA: Diagnosis not present

## 2014-07-17 DIAGNOSIS — I480 Paroxysmal atrial fibrillation: Secondary | ICD-10-CM | POA: Diagnosis not present

## 2014-07-17 DIAGNOSIS — Z7901 Long term (current) use of anticoagulants: Secondary | ICD-10-CM | POA: Diagnosis not present

## 2014-07-17 DIAGNOSIS — I272 Other secondary pulmonary hypertension: Secondary | ICD-10-CM | POA: Insufficient documentation

## 2014-07-17 DIAGNOSIS — N183 Chronic kidney disease, stage 3 (moderate): Secondary | ICD-10-CM | POA: Insufficient documentation

## 2014-07-17 DIAGNOSIS — R140 Abdominal distension (gaseous): Secondary | ICD-10-CM | POA: Diagnosis not present

## 2014-07-17 DIAGNOSIS — R1900 Intra-abdominal and pelvic swelling, mass and lump, unspecified site: Secondary | ICD-10-CM

## 2014-07-17 LAB — BASIC METABOLIC PANEL
ANION GAP: 10 (ref 5–15)
BUN: 35 mg/dL — AB (ref 6–23)
CHLORIDE: 90 mmol/L — AB (ref 96–112)
CO2: 30 mmol/L (ref 19–32)
Calcium: 8.6 mg/dL (ref 8.4–10.5)
Creatinine, Ser: 1.51 mg/dL — ABNORMAL HIGH (ref 0.50–1.35)
GFR, EST AFRICAN AMERICAN: 49 mL/min — AB (ref >90)
GFR, EST NON AFRICAN AMERICAN: 42 mL/min — AB (ref >90)
GLUCOSE: 121 mg/dL — AB (ref 70–99)
Potassium: 2.4 mmol/L — CL (ref 3.5–5.1)
Sodium: 130 mmol/L — ABNORMAL LOW (ref 135–145)

## 2014-07-17 MED ORDER — SORBITOL 70 % SOLN: 30.0000 mL | Freq: Every day | Status: DC

## 2014-07-17 MED ORDER — SENNOSIDES-DOCUSATE SODIUM 8.6-50 MG PO TABS: 2.0000 | ORAL_TABLET | Freq: Two times a day (BID) | ORAL | Status: DC

## 2014-07-17 MED ORDER — TRAMADOL HCL 50 MG PO TABS: 50.0000 mg | ORAL_TABLET | Freq: Two times a day (BID) | ORAL | Status: DC | PRN

## 2014-07-17 NOTE — Progress Notes (Signed)
Advanced Heart Failure Medication Review by a Pharmacist  Does the patient  feel that his/her medications are working for him/her?  yes  Has the patient been experiencing any side effects to the medications prescribed?  no  Does the patient measure his/her own blood pressure or blood glucose at home?  no   Does the patient have any problems obtaining medications due to transportation or finances?   no  Understanding of regimen: fair Understanding of indications: fair Potential of compliance: fair    Pharmacist comments:  Tyler Christensen is a pleasant 81 yo spanish-speaking M who presents to clinic with his medication bottles and a medication list. He does not have a good understanding of how he takes any of his medications but does have a [place] [facility] nurse ([name]) who fills his pillbox weekly. There were multiple discrepancies in directions between his medication labels and his list. I spoke with [name], [name], about his regimen and she confirmed his medication dosages for me. I have also updated his medication list and re-labeled his medication bottles to reflect the correct current dosages.    Tyler Christensen. [name], PharmD Clinical Pharmacist - Resident Pager: [phone] Pharmacy: [phone] 07/17/2014 12:14 PM

## 2014-07-17 NOTE — Patient Instructions (Signed)
Labs today  Your physician recommends that you schedule a follow-up appointment in: 2 weeks   Do the following things EVERYDAY: 1) Weigh yourself in the morning before breakfast. Write it down and keep it in a log. 2) Take your medicines as prescribed 3) Eat low salt foods-Limit salt (sodium) to 2000 mg per day.  4) Stay as active as you can everyday 5) Limit all fluids for the day to less than 2 liters 6)   

## 2014-07-17 NOTE — Progress Notes (Addendum)
Patient ID: Tyler Christensen, male   DOB: [birth year], 81 y.o.   MRN: [id number]  [facility]  HPI: Tyler Christensen is a 81 y.o. [name] (non-english speaking) w/ PMHx significant for chronic systolic CHF (EF 19%), NICM, LBBB, PAF (on coumadin), HTN, HLD, CVA, Pulmonary Hypertension and h/o seizure d/o (2/2 CVA 05/2012).   Admitted to [facility] 06/2012 with acute decompensated heart failure EF 20%. As noted below, also diagnosed with severe pulmonary hypertension (PAPs ~ 90) from initial RHC and placed on Milrinone and Revatio (eventually transitioned to tadalafil 40) with marked clinical response and decrease in PA pressures. VQ low probability for pulmonary embolus.  Discharged on Milrinone at 0.375 mcg via PICC. Discharge Weight 146 pounds. Attempted to wean milrinone in 9/14 but failed due to worsening HF and PAH. Milrinone restarted.  Admitted to the hospital 5/21-11/22/13. Found to have staph aureus bacteremia treated with IV abx. TEE negative for endocarditis. Also found to retroperitoneal mass and biopsy revealed benign lesion. He had evidence of low output thought to be due to atrial fibrillation and milrinone increased. Attempted TEE and DC-CV but had LAA clot so deferred cardioversion.  He was admitted in 12/15 with acute on chronic systolic CHF.  He was diuresed with IV Lasix and milrinone was increased to 0.5 mcg/kg/min.    Follow-up:  Readmitted last week (1/16) with abdominal distension and pain. Thought to be due to constipation. 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 HF was stable with normal co-ox during admit. Treated with sorbitol. He returns with recurrent ab pain and distension. Feels bad. Having very hard stools and straining with BMs. Much confusion about his meds and discrepancies with his med list. His HHRN [name] actually fills his pill box though. Taking torsemide 80 daily Weight stable at home 145-146.  He is able to walk short  distances on flat ground without dyspnea.    NO ACE-I 2/2 rash   08/14/12 S/P successful DC-CV of AF 10/26/12 S/P BiV-ICD implant 01/07/13 ECHO EF 20%, restrictive diastolic function, mildly decreased RV size and systolic fxn, mild to moderate MR, PA systolic pressure 59 mmHg  02/21/13 ECHO EF 20% PA pressure mean 59 11/12/13: ECHO EF 20-25%, trivial AI,    RHC 02/21/13 (off milrinone) RA = 9 with v-waves to 20  RV = 72/8/20  PA = 69/27 (44)  PCW = 18-20  Fick cardiac output/index = 2.7/1.5  PVR = 9.6 woods  FA sat = 96%  PA sat = 48%, 52%  Labs      (06/25/13) K 4.2 Creatinine 1.58 Magnesium 2.0     (07/16/13) K 4.2, Cr 1.67    (2/15) creatinine 1.97 => 1.94, K 4.6, HCT 39.4    (4/15) K 4.5, creatinine 1.56, pro-BNP 6912    (4/15) K 3.9, creatinine 2.39, mag 2.1     (09/2013) K 3.7, creatinine 2.03     11/26/13 K 4.6 cr 2.8 hgb 13.2     12/11/13 K 3.4 Creatinine 2.14  Magnesium 2.1     12/19/13: K+ 3.4, creatinine 2.42, BUN 57     01/14/14: K 3.8, creatinine 2.45, BUN 45, mag 2.1    12/15 K 3.6, creatinine 1.68, HCT 34.4    SH: Lives with his wife here in [place].   ROS: All systems negative except as listed in HPI, PMH and Problem List.  Past Medical History  Diagnosis Date  . HTN (hypertension)   . High cholesterol   .  CHF (congestive heart failure)   . BBBB (bilateral bundle branch block)   . LBBB (left bundle branch block)   . Seizures 06/11/2012    new onset/notes (06/11/2012)  . SOB (shortness of breath)     "sometimes when I lay down;; related to not taking my RX" (06/11/2012)  . Myocardial infarction     06/10/2012  . Atrial fibrillation   . Stroke   . Atrial thrombus     left  . Pulmonary HTN   . Atrial fibrillation 06/12/2012    Current Outpatient Prescriptions  Medication Sig Dispense Refill  . amiodarone (PACERONE) 200 MG tablet Take 1 tablet (200 mg total) by mouth daily. 30 tablet 0  . bisacodyl (BISACODYL) 5 MG EC tablet Take 1 tablet (5 mg total) by  mouth daily as needed for moderate constipation. 30 tablet 0  . magnesium oxide (MAG-OX) 400 MG tablet Take 1 tablet (400 mg total) by mouth 2 (two) times daily. (Patient taking differently: Take 400 mg by mouth daily. ) 60 tablet 3  . metolazone (ZAROXOLYN) 2.5 MG tablet Take 2.5 mg by mouth as directed.    . milrinone (PRIMACOR) 20 MG/100ML SOLN infusion Inject 33.95 mcg/min into the vein continuous. 100 mL 0  . Oxycodone HCl 10 MG TABS Take 10 mg by mouth every 4 (four) hours as needed (pain).    . potassium chloride SA (K-DUR,KLOR-CON) 20 MEQ tablet Take 1 tablet (20 mEq total) by mouth daily. (Patient taking differently: Take 60 mEq by mouth 2 (two) times daily. ) 40 tablet 0  . senna (SENOKOT) 8.6 MG TABS tablet Take 2 tablets (17.2 mg total) by mouth 2 (two) times daily. 120 each 0  . sildenafil (REVATIO) 20 MG tablet Take 1 tablet (20 mg total) by mouth 3 (three) times daily. 90 tablet 3  . spironolactone (ALDACTONE) 25 MG tablet Take 1 tablet (25 mg total) by mouth 2 (two) times daily. 60 tablet 3  . torsemide (DEMADEX) 20 MG tablet Take 4 tablets (80 mg total) by mouth daily. (Patient taking differently: Take 80 mg by mouth 2 (two) times daily. ) 120 tablet 3  . traMADol (ULTRAM) 50 MG tablet TAKE 1 TABLET BY MOUTH TWICE DAILY AS NEEDED FOR PAIN 60 tablet 0  . warfarin (COUMADIN) 4 MG tablet Take 4 mg Monday, Wednesday, Friday and Sunday. 2 mg on Tues, Thurs and Sat 45 tablet 3  . acetaminophen (TYLENOL) 325 MG tablet Take 2 tablets (650 mg total) by mouth every 6 (six) hours as needed for mild pain (or Fever >/= 101). (Patient not taking: Reported on 07/17/2014)    . docusate sodium (COLACE) 100 MG capsule Take 1 capsule (100 mg total) by mouth 2 (two) times daily. (Patient not taking: Reported on 07/17/2014) 10 capsule 0  . sorbitol 70 % SOLN Take 30 mLs by mouth daily as needed for moderate constipation. (Patient not taking: Reported on 07/17/2014) 500 mL 3   No current  facility-administered medications for this encounter.    Filed Vitals:   07/17/14 1042  BP: 92/42  Pulse: 75  Weight: 149 lb (67.586 kg)  SpO2: 93%   PHYSICAL EXAM: General:  Elderly, chronically ill appearing. NAD.No resp difficulty; interpreter present HEENT: normal Neck: supple. JVD 7-8; Carotids 2+ bilaterally; no bruits. No lymphadenopathy or thryomegaly appreciated.  Cor: PMI laterally displaced. Regular rhythm and rate. No s3 or murmur.  Lungs: clear Abdomen: soft, nontender, mildy distended. No hepatosplenomegaly. No bruits or masses. Good bowel sounds. Extremities:  no cyanosis, clubbing, rash. 1+ edema 1/2 up lower legs bilaterally.   Neuro: alert & orientedx3, cranial nerves grossly intact. Moves all 4 extremities w/o difficulty. Affect pleasant.   ASSESSMENT & PLAN:  1. Chronic systolic CHF: NICM, EF 01% s/p Medtronic CRT-D; patient is on milrinone, recently increased to 0.5 mcg/kg/min.  He is not interested in palliative care or hospice at this time. He is a DNR. NYHA III symptoms and volume status stable.  -  Based on his med bottles it says he is taking torsemide 80 bid but we confirmed with his [facility] that he is taking it correctly and is only taking 80 daily - Continue milrinone 0.5 mcg through PICC and will get weekly labs.   - Not on BB with history of low output and milrinone.  - He is not on ACE-I due to rash and no longer on ARB or spironolactone with renal function. - Continue current spironolactone to help with hypokalemia  - Discussed the importance of daily weights, a low sodium diet, and fluid restriction (less than 2 L a day). Instructed to call the HF clinic if weight increases more than 3 lbs overnight or 5 lbs in a week.  2. Paroxysmal atrial fibrillation/flutter: Cardioverted back to NSR 12/27/13. Appears to be in NSR on amiodraone. Will continue amio 200 mg BID. Continue coumadin.  Will need to follow LFTs and TFTs.  3. Pulmonary arterial hypertension:  Noted on RHC 02/21/13.  Continue revatio 20 mg TID 40 mg and milrinone.  4. CKD stage III: Creatinine improved on milrinone. Will recheck.  5. Abdominal discomfort/distension: I suspect most of this is constipation. Will add daily sorbitol and change to Senna-S 6. Medication issues - we spent extensive time with our pharmacist today reviewing his meds and will contact [facility] to do med rec.   Total time spent 45 minutes. Over half that time spent discussing above.   Benay Spice 07/17/2014

## 2014-07-18 ENCOUNTER — Encounter: Payer: Self-pay | Admitting: Internal Medicine

## 2014-07-18 ENCOUNTER — Ambulatory Visit (INDEPENDENT_AMBULATORY_CARE_PROVIDER_SITE_OTHER): Payer: Medicare Other | Admitting: Internal Medicine

## 2014-07-18 ENCOUNTER — Encounter (HOSPITAL_COMMUNITY): Payer: Medicare Other

## 2014-07-18 VITALS — BP 108/58 | HR 81 | Ht 68.0 in | Wt 148.4 lb

## 2014-07-18 DIAGNOSIS — I5022 Chronic systolic (congestive) heart failure: Secondary | ICD-10-CM

## 2014-07-18 DIAGNOSIS — I472 Ventricular tachycardia, unspecified: Secondary | ICD-10-CM | POA: Insufficient documentation

## 2014-07-18 DIAGNOSIS — I428 Other cardiomyopathies: Secondary | ICD-10-CM

## 2014-07-18 DIAGNOSIS — I480 Paroxysmal atrial fibrillation: Secondary | ICD-10-CM

## 2014-07-18 DIAGNOSIS — I429 Cardiomyopathy, unspecified: Secondary | ICD-10-CM

## 2014-07-18 DIAGNOSIS — Z95810 Presence of automatic (implantable) cardiac defibrillator: Secondary | ICD-10-CM

## 2014-07-18 LAB — MDC_IDC_ENUM_SESS_TYPE_INCLINIC
Battery Remaining Longevity: 70 mo
Battery Voltage: 2.99 V
Brady Statistic AP VP Percent: 0.83 %
Brady Statistic AP VS Percent: 0.02 %
Brady Statistic AS VP Percent: 96.3 %
Brady Statistic AS VS Percent: 2.85 %
Brady Statistic RA Percent Paced: 0.85 %
Date Time Interrogation Session: 20160129102136
HIGH POWER IMPEDANCE MEASURED VALUE: 77 Ohm
HighPow Impedance: 190 Ohm
Lead Channel Impedance Value: 456 Ohm
Lead Channel Impedance Value: 456 Ohm
Lead Channel Impedance Value: 779 Ohm
Lead Channel Pacing Threshold Amplitude: 1.125 V
Lead Channel Pacing Threshold Pulse Width: 0.4 ms
Lead Channel Sensing Intrinsic Amplitude: 3.125 mV
Lead Channel Setting Pacing Amplitude: 1.5 V
Lead Channel Setting Pacing Pulse Width: 0.4 ms
MDC IDC MSMT LEADCHNL LV PACING THRESHOLD AMPLITUDE: 0.75 V
MDC IDC MSMT LEADCHNL LV PACING THRESHOLD PULSEWIDTH: 0.4 ms
MDC IDC MSMT LEADCHNL RA IMPEDANCE VALUE: 532 Ohm
MDC IDC MSMT LEADCHNL RA PACING THRESHOLD AMPLITUDE: 0.5 V
MDC IDC MSMT LEADCHNL RA SENSING INTR AMPL: 3.125 mV
MDC IDC MSMT LEADCHNL RV IMPEDANCE VALUE: 380 Ohm
MDC IDC MSMT LEADCHNL RV PACING THRESHOLD PULSEWIDTH: 0.4 ms
MDC IDC MSMT LEADCHNL RV SENSING INTR AMPL: 6.5 mV
MDC IDC MSMT LEADCHNL RV SENSING INTR AMPL: 6.5 mV
MDC IDC SET LEADCHNL LV PACING AMPLITUDE: 2 V
MDC IDC SET LEADCHNL LV PACING PULSEWIDTH: 0.6 ms
MDC IDC SET LEADCHNL RV PACING AMPLITUDE: 2.25 V
MDC IDC SET LEADCHNL RV SENSING SENSITIVITY: 0.3 mV
MDC IDC SET ZONE DETECTION INTERVAL: 450 ms
MDC IDC STAT BRADY RV PERCENT PACED: 96.37 %
Zone Setting Detection Interval: 300 ms
Zone Setting Detection Interval: 350 ms
Zone Setting Detection Interval: 360 ms

## 2014-07-18 NOTE — Patient Instructions (Addendum)
Remote monitoring is used to monitor your Pacemaker of ICD from home. This monitoring reduces the number of office visits required to check your device to one time per year. It allows [name] to keep an eye on the functioning of your device to ensure it is working properly. You are scheduled for a device check from home on 10/20/14. You may send your transmission at any time that day. If you have a wireless device, the transmission will be sent automatically. After your physician reviews your transmission, you will receive a postcard with your next transmission date.  Your physician wants you to follow-up in: 1 year with Dr. Lovena Le. You will receive a reminder letter in the mail two months in advance. If you don't receive a letter, please call our office to schedule the follow-up appointment.  Your physician recommends that you continue on your current medications as directed. Please refer to the Current Medication list given to you today.

## 2014-07-18 NOTE — Progress Notes (Signed)
HPI Tyler Christensen returns today for followup. He is a pleasant 81 yo man with severe ionotropic dependent chronic systolic heart failure, with severe LV dysfunction and LBBB who underwent insertion of a BiV ICD 2 years ago. In the interim, he has improved. He remains on IV ionotropic support and has been unable to be weaned off of his ionotropic support. He denies chest pain. He has class 2b-3a heart failure symptoms. No edema. He does have what looks like ascites. Allergies  Allergen Reactions  . Lisinopril Rash     Current Outpatient Prescriptions  Medication Sig Dispense Refill  . acetaminophen (TYLENOL) 325 MG tablet Take 2 tablets (650 mg total) by mouth every 6 (six) hours as needed for mild pain (or Fever >/= 101).    Marland Kitchen amiodarone (PACERONE) 200 MG tablet Take 1 tablet (200 mg total) by mouth daily. 30 tablet 0  . magnesium oxide (MAG-OX) 400 MG tablet Take 1 tablet (400 mg total) by mouth 2 (two) times daily. 60 tablet 3  . metolazone (ZAROXOLYN) 2.5 MG tablet Take 2.5 mg by mouth 2 (two) times a week. Take Sunday and Thursday    . milrinone (PRIMACOR) 20 MG/100ML SOLN infusion Inject 33.95 mcg/min into the vein continuous. 100 mL 0  . Oxycodone HCl 10 MG TABS Take 10 mg by mouth every 4 (four) hours as needed (pain).    . potassium chloride SA (K-DUR,KLOR-CON) 20 MEQ tablet Take 1 tablet (20 mEq total) by mouth daily. 40 tablet 0  . senna-docusate (SENOKOT-S) 8.6-50 MG per tablet Take 2 tablets by mouth 2 (two) times daily. 60 tablet 3  . sildenafil (REVATIO) 20 MG tablet Take 1 tablet (20 mg total) by mouth 3 (three) times daily. 90 tablet 3  . sorbitol 70 % SOLN Take 30 mLs by mouth daily. 500 mL 3  . spironolactone (ALDACTONE) 25 MG tablet Take 1 tablet (25 mg total) by mouth 2 (two) times daily. 60 tablet 3  . torsemide (DEMADEX) 20 MG tablet Take 4 tablets (80 mg total) by mouth daily. 120 tablet 3  . traMADol (ULTRAM) 50 MG tablet Take 1 tablet (50 mg total) by mouth 2 (two) times  daily as needed. for pain 60 tablet 0  . warfarin (COUMADIN) 4 MG tablet Take 4 mg Monday, Wednesday, Friday and Sunday. 2 mg on Tues, Thurs and Sat 45 tablet 3   No current facility-administered medications for this visit.     Past Medical History  Diagnosis Date  . HTN (hypertension)   . High cholesterol   . CHF (congestive heart failure)   . BBBB (bilateral bundle branch block)   . LBBB (left bundle branch block)   . Seizures 06/11/2012    new onset/notes (06/11/2012)  . SOB (shortness of breath)     "sometimes when I lay down;; related to not taking my RX" (06/11/2012)  . Myocardial infarction     12 /22/2013  . Atrial fibrillation   . Stroke   . Atrial thrombus     left  . Pulmonary HTN   . Atrial fibrillation 06/12/2012    ROS:   All systems reviewed and negative except as noted in the HPI.   Past Surgical History  Procedure Laterality Date  . Throat surgery  1942    "and nose" (06/11/2012); ?T&A  . Inguinal hernia repair  ~ 2008    "both sides" (06/11/2012)  . Tee without cardioversion  06/29/2012    Procedure: TRANSESOPHAGEAL ECHOCARDIOGRAM (TEE);  Surgeon: Shaune Pascal Bensimhon,  MD;  Location: [place];  Service: Cardiovascular;  Laterality: N/A;  will need a spanish interpreter Interpreter will be here at 1330-Hope spoke w/ [place]  . Cardioversion N/A 08/14/2012    Procedure: CARDIOVERSION;  Surgeon: Jolaine Artist, MD;  Location: [facility] ENDOSCOPY;  Service: Cardiovascular;  Laterality: N/A;  . Tee without cardioversion N/A 11/12/2013    Procedure: TRANSESOPHAGEAL ECHOCARDIOGRAM (TEE);  Surgeon: Candee Furbish, MD;  Location: [facility] ENDOSCOPY;  Service: Cardiovascular;  Laterality: N/A;  . Tee without cardioversion N/A 11/19/2013    Procedure: TRANSESOPHAGEAL ECHOCARDIOGRAM (TEE);  Surgeon: Jolaine Artist, MD;  Location: [facility] ENDOSCOPY;  Service: Cardiovascular;  Laterality: N/A;  . Cardioversion N/A 12/27/2013    Procedure: CARDIOVERSION;  Surgeon: Jolaine Artist,  MD;  Location: [facility] ENDOSCOPY;  Service: Cardiovascular;  Laterality: N/A;  . Left and right heart catheterization with coronary angiogram N/A 07/02/2012    Procedure: LEFT AND RIGHT HEART CATHETERIZATION WITH CORONARY ANGIOGRAM;  Surgeon: Jolaine Artist, MD;  Location: [facility] CATH LAB;  Service: Cardiovascular;  Laterality: N/A;  . Right heart catheterization N/A 07/06/2012    Procedure: RIGHT HEART CATH;  Surgeon: Jolaine Artist, MD;  Location: [facility] CATH LAB;  Service: Cardiovascular;  Laterality: N/A;  . Bi-ventricular implantable cardioverter defibrillator N/A 10/26/2012    Procedure: BI-VENTRICULAR IMPLANTABLE CARDIOVERTER DEFIBRILLATOR  (CRT-D);  Surgeon: Evans Lance, MD;  Location: [facility] CATH LAB;  Service: Cardiovascular;  Laterality: N/A;     Family History  Problem Relation Age of Onset  . Heart attack      father had MI in his 28s, no fhx of early heart problem before age 58  . Heart attack      mother died of MI around 15     History   Social History  . Marital Status: Divorced    Spouse Name: N/A    Number of Children: N/A  . Years of Education: N/A   Occupational History  . Not on file.   Social History Main Topics  . Smoking status: Never Smoker   . Smokeless tobacco: Never Used  . Alcohol Use: No  . Drug Use: No  . Sexual Activity: No   Other Topics Concern  . Not on file   Social History Narrative     BP 108/58 mmHg  Pulse 81  Ht 5\' 8"  (1.727 m)  Wt 148 lb 6.4 oz (67.314 kg)  BMI 22.57 kg/m2  Physical Exam:  Well appearing 81 yo man, NAD HEENT: Unremarkable Neck:  6 cm JVD, no thyromegally Back:  No CVA tenderness Lungs:  Clear with no wheezes, rales, or rhonchi HEART:  Regular rate rhythm, no murmurs, no rubs, no clicks Abd:  soft, positive bowel sounds, no organomegally, no rebound, no guarding Ext:  2 plus pulses, no edema, no cyanosis, no clubbing Skin:  No rashes no nodules Neuro:  CN II through XII intact, motor grossly  intact  DEVICE  Normal device function.  See PaceArt for details.   Assess/Plan:

## 2014-07-18 NOTE — Assessment & Plan Note (Signed)
His Medtronic BiV ICD is working normally. Will recheck in several months. 

## 2014-07-18 NOTE — Assessment & Plan Note (Signed)
He is currently maintaining NSR on amio. He will continue his sytemic anti-coagulation.

## 2014-07-18 NOTE — Assessment & Plan Note (Signed)
His symptoms are well compensated. He will continue his current meds and ionotropic support under the direction of our heart failure team.

## 2014-07-18 NOTE — Assessment & Plan Note (Signed)
He has had 3 episodes of VT in the past 3-4 months. All successfully terminated with ICD shock after failed ATP. He will continue amiodarone. I considered uptitration to 400 mg daily and would recommend this if he has another episode in the next 2 months.

## 2014-07-22 ENCOUNTER — Telehealth (HOSPITAL_COMMUNITY): Payer: Self-pay | Admitting: Vascular Surgery

## 2014-07-22 ENCOUNTER — Ambulatory Visit (INDEPENDENT_AMBULATORY_CARE_PROVIDER_SITE_OTHER): Payer: Medicare Other | Admitting: Cardiology

## 2014-07-22 DIAGNOSIS — I4891 Unspecified atrial fibrillation: Secondary | ICD-10-CM

## 2014-07-22 LAB — POCT INR: INR: 2.6

## 2014-07-22 MED ORDER — SPIRONOLACTONE 25 MG PO TABS: 50.0000 mg | ORAL_TABLET | Freq: Every day | ORAL | Status: DC

## 2014-07-22 NOTE — Telephone Encounter (Signed)
Spoke w/Tyler Christensen she states pt missed meds on Fri and Sat, she also states he has not been taking his PM Arlyce Harman, discussed w/Dr Bensimhon he would like pt to take 80 meq of KCL today and change Spiro to 50 mg in AM, she will notify pt

## 2014-07-22 NOTE — Telephone Encounter (Signed)
[facility] pt potassium 2.6, pt did nt take meds 2 days last week, please give nurse a call to let her know how much potassium he needs to take going forward.. Please advise

## 2014-07-28 ENCOUNTER — Encounter: Payer: Self-pay | Admitting: Internal Medicine

## 2014-07-29 ENCOUNTER — Ambulatory Visit (INDEPENDENT_AMBULATORY_CARE_PROVIDER_SITE_OTHER): Payer: Medicare Other | Admitting: Cardiology

## 2014-07-29 DIAGNOSIS — I4891 Unspecified atrial fibrillation: Secondary | ICD-10-CM

## 2014-07-29 LAB — POCT INR: INR: 1.7

## 2014-07-31 ENCOUNTER — Ambulatory Visit (HOSPITAL_COMMUNITY)
Admission: RE | Admit: 2014-07-31 | Discharge: 2014-07-31 | Disposition: A | Discharge status: Home / Self Care | Payer: Medicare Other | Source: Ambulatory Visit | Attending: Cardiology | Admitting: Cardiology

## 2014-07-31 VITALS — BP 104/52 | HR 83 | Wt 144.4 lb

## 2014-07-31 DIAGNOSIS — Z8673 Personal history of transient ischemic attack (TIA), and cerebral infarction without residual deficits: Secondary | ICD-10-CM | POA: Diagnosis not present

## 2014-07-31 DIAGNOSIS — N183 Chronic kidney disease, stage 3 (moderate): Secondary | ICD-10-CM | POA: Diagnosis not present

## 2014-07-31 DIAGNOSIS — R21 Rash and other nonspecific skin eruption: Secondary | ICD-10-CM | POA: Diagnosis not present

## 2014-07-31 DIAGNOSIS — R1084 Generalized abdominal pain: Secondary | ICD-10-CM

## 2014-07-31 DIAGNOSIS — I480 Paroxysmal atrial fibrillation: Secondary | ICD-10-CM | POA: Diagnosis not present

## 2014-07-31 DIAGNOSIS — I5022 Chronic systolic (congestive) heart failure: Secondary | ICD-10-CM | POA: Diagnosis present

## 2014-07-31 DIAGNOSIS — R112 Nausea with vomiting, unspecified: Secondary | ICD-10-CM

## 2014-07-31 DIAGNOSIS — I252 Old myocardial infarction: Secondary | ICD-10-CM | POA: Insufficient documentation

## 2014-07-31 DIAGNOSIS — Z79899 Other long term (current) drug therapy: Secondary | ICD-10-CM | POA: Diagnosis not present

## 2014-07-31 DIAGNOSIS — E780 Pure hypercholesterolemia: Secondary | ICD-10-CM | POA: Diagnosis not present

## 2014-07-31 DIAGNOSIS — Z66 Do not resuscitate: Secondary | ICD-10-CM | POA: Diagnosis not present

## 2014-07-31 DIAGNOSIS — I272 Other secondary pulmonary hypertension: Secondary | ICD-10-CM | POA: Diagnosis not present

## 2014-07-31 DIAGNOSIS — G40909 Epilepsy, unspecified, not intractable, without status epilepticus: Secondary | ICD-10-CM | POA: Insufficient documentation

## 2014-07-31 DIAGNOSIS — E876 Hypokalemia: Secondary | ICD-10-CM | POA: Insufficient documentation

## 2014-07-31 DIAGNOSIS — R188 Other ascites: Secondary | ICD-10-CM

## 2014-07-31 DIAGNOSIS — I129 Hypertensive chronic kidney disease with stage 1 through stage 4 chronic kidney disease, or unspecified chronic kidney disease: Secondary | ICD-10-CM | POA: Insufficient documentation

## 2014-07-31 MED ORDER — METOCLOPRAMIDE HCL 10 MG PO TABS: 10.0000 mg | ORAL_TABLET | Freq: Two times a day (BID) | ORAL | Status: AC

## 2014-07-31 MED ORDER — POTASSIUM CHLORIDE CRYS ER 20 MEQ PO TBCR: 20.0000 meq | EXTENDED_RELEASE_TABLET | Freq: Two times a day (BID) | ORAL | Status: DC

## 2014-07-31 NOTE — Patient Instructions (Addendum)
Take Reglan 10mg  tablet twice daily.  We will schedule you for gastric emptying study.   August 19, 2014 @ 8:45am  We will refer you to a gastroenterologist specialist (Dr. Hilarie Fredrickson).   INCREASE Potassium 20 MEQ twice a day  Your physician recommends that you schedule a follow-up appointment in: 1 month

## 2014-07-31 NOTE — Addendum Note (Signed)
Encounter addended by: Kerry Dory, CMA on: 07/31/2014  1:52 PM<BR>     Documentation filed: Visit Diagnoses, Orders, Dx Association, Patient Instructions Section

## 2014-07-31 NOTE — Addendum Note (Signed)
Encounter addended by: Renee Pain, RN on: 07/31/2014  1:22 PM<BR>     Documentation filed: Visit Diagnoses, Dx Association, Patient Instructions Section, Orders

## 2014-07-31 NOTE — Progress Notes (Signed)
Patient ID: Ercil Cassis, male   DOB: 31-May-1933, 81 y.o.   MRN: [id number]  [employer]  HPI: Mr. Piedra is a 81 y.o. [name] (non-english speaking) w/ PMHx significant for chronic systolic CHF (EF 23%), NICM, LBBB, PAF (on coumadin), HTN, HLD, CVA, Pulmonary Hypertension and h/o seizure d/o (2/2 CVA 05/2012).   Admitted to [facility] 06/2012 with acute decompensated heart failure EF 20%. As noted below, also diagnosed with severe pulmonary hypertension (PAPs ~ 90) from initial RHC and placed on Milrinone and Revatio (eventually transitioned to tadalafil 40) with marked clinical response and decrease in PA pressures. VQ low probability for pulmonary embolus.  Discharged on Milrinone at 0.375 mcg via PICC. Discharge Weight 146 pounds. Attempted to wean milrinone in 9/14 but failed due to worsening HF and PAH. Milrinone restarted.  Admitted to the hospital 5/21-11/22/13. Found to have staph aureus bacteremia treated with IV abx. TEE negative for endocarditis. Also found to retroperitoneal mass and biopsy revealed benign lesion. He had evidence of low output thought to be due to atrial fibrillation and milrinone increased. Attempted TEE and DC-CV but had LAA clot so deferred cardioversion.  He was admitted in 12/15 with acute on chronic systolic CHF.  He was diuresed with IV Lasix and milrinone was increased to 0.5 mcg/kg/min.    Admitted 1/16 readmitted last week (1/16) with abdominal distension and pain. Thought to be due to constipation. 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 HF was stable with normal co-ox during admit. Treated with sorbitol.  Follow-up:   Here for routine f/u/ We saw him two weeks ago and main complaint was ab pain and distension. Bowel regimen changed to include daily sorbitol and senna-s. Weight down 4 pounds. He is able to walk short distances on flat ground without dyspnea. K has been running low (apparently he often skips  Kcl pills). Arlyce Harman increased to 50mg  daily last week. Main complaint continues to be related to his stomach. Appetite is worse and frequently nauseated. After eating gets dry heaves that last about an hour. Bowel movements improved. Now fairly regular every other day. No diarrhea. No orthopnea or PND. Leg edema much improved.   NO ACE-I 2/2 rash   08/14/12 S/P successful DC-CV of AF 10/26/12 S/P BiV-ICD implant 01/07/13 ECHO EF 20%, restrictive diastolic function, mildly decreased RV size and systolic fxn, mild to moderate MR, PA systolic pressure 59 mmHg  02/21/13 ECHO EF 20% PA pressure mean 59 11/12/13: ECHO EF 20-25%, trivial AI,    RHC 02/21/13 (off milrinone) RA = 9 with v-waves to 20  RV = 72/8/20  PA = 69/27 (44)  PCW = 18-20  Fick cardiac output/index = 2.7/1.5  PVR = 9.6 woods  FA sat = 96%  PA sat = 48%, 52%  Labs      (06/25/13) K 4.2 Creatinine 1.58 Magnesium 2.0     (07/16/13) K 4.2, Cr 1.67    (2/15) creatinine 1.97 => 1.94, K 4.6, HCT 39.4    (4/15) K 4.5, creatinine 1.56, pro-BNP 6912    (4/15) K 3.9, creatinine 2.39, mag 2.1     (09/2013) K 3.7, creatinine 2.03     11/26/13 K 4.6 cr 2.8 hgb 13.2     12/11/13 K 3.4 Creatinine 2.14  Magnesium 2.1     12/19/13: K+ 3.4, creatinine 2.42, BUN 57     01/14/14: K 3.8, creatinine 2.45, BUN 45, mag 2.1    12/15 K 3.6, creatinine  1.68, HCT 34.4     07/17/14: K 2.4 creatinine 1.51    SH: Lives with his wife here in [place].   ROS: All systems negative except as listed in HPI, PMH and Problem List.  Past Medical History  Diagnosis Date  . HTN (hypertension)   . High cholesterol   . CHF (congestive heart failure)   . BBBB (bilateral bundle branch block)   . LBBB (left bundle branch block)   . Seizures 06/11/2012    new onset/notes (06/11/2012)  . SOB (shortness of breath)     "sometimes when I lay down;; related to not taking my RX" (06/11/2012)  . Myocardial infarction     06/10/2012  . Atrial fibrillation   . Stroke   .  Atrial thrombus     left  . Pulmonary HTN   . Atrial fibrillation 06/12/2012    Current Outpatient Prescriptions  Medication Sig Dispense Refill  . acetaminophen (TYLENOL) 325 MG tablet Take 2 tablets (650 mg total) by mouth every 6 (six) hours as needed for mild pain (or Fever >/= 101).    Marland Kitchen amiodarone (PACERONE) 200 MG tablet Take 1 tablet (200 mg total) by mouth daily. 30 tablet 0  . magnesium oxide (MAG-OX) 400 MG tablet Take 1 tablet (400 mg total) by mouth 2 (two) times daily. 60 tablet 3  . metolazone (ZAROXOLYN) 2.5 MG tablet Take 2.5 mg by mouth 2 (two) times a week. Take Sunday and Thursday    . milrinone (PRIMACOR) 20 MG/100ML SOLN infusion Inject 33.95 mcg/min into the vein continuous. 100 mL 0  . Oxycodone HCl 10 MG TABS Take 10 mg by mouth every 4 (four) hours as needed (pain).    . potassium chloride SA (K-DUR,KLOR-CON) 20 MEQ tablet Take 1 tablet (20 mEq total) by mouth daily. 40 tablet 0  . senna-docusate (SENOKOT-S) 8.6-50 MG per tablet Take 2 tablets by mouth 2 (two) times daily. 60 tablet 3  . sildenafil (REVATIO) 20 MG tablet Take 1 tablet (20 mg total) by mouth 3 (three) times daily. 90 tablet 3  . sorbitol 70 % SOLN Take 30 mLs by mouth daily. 500 mL 3  . spironolactone (ALDACTONE) 25 MG tablet Take 2 tablets (50 mg total) by mouth daily. 60 tablet 3  . torsemide (DEMADEX) 20 MG tablet Take 4 tablets (80 mg total) by mouth daily. 120 tablet 3  . traMADol (ULTRAM) 50 MG tablet Take 1 tablet (50 mg total) by mouth 2 (two) times daily as needed. for pain 60 tablet 0  . warfarin (COUMADIN) 4 MG tablet Take 4 mg Monday, Wednesday, Friday and Sunday. 2 mg on Tues, Thurs and Sat 45 tablet 3   No current facility-administered medications for this encounter.    Filed Vitals:   07/31/14 1241  BP: 104/52  Pulse: 83  Weight: 144 lb 6.4 oz (65.499 kg)  SpO2: 93%   PHYSICAL EXAM: General:  Elderly, NAD.No resp difficulty; interpreter present Neta Ehlers) HEENT:  normal Neck: supple. JVD 6; Carotids 2+ bilaterally; no bruits. No lymphadenopathy or thryomegaly appreciated.  Cor: PMI laterally displaced. Regular rhythm and rate. No s3 or murmur.  Lungs: clear Abdomen: soft, nontender, mildy distended. No hepatosplenomegaly. No bruits or masses. Good bowel sounds. Extremities: no cyanosis, clubbing, rash. no edema .   Neuro: alert & orientedx3, cranial nerves grossly intact. Moves all 4 extremities w/o difficulty. Affect pleasant.   ASSESSMENT & PLAN:  1. Chronic systolic CHF: NICM, EF 65% s/p [employer] CRT-D; patient is  on milrinone, recently increased to 0.5 mcg/kg/min. HF now much more stable. Volume status looks good.   He is not interested in palliative care or hospice at this time. He is a DNR. NYHA III symptoms and volume status stable.  - Continue milrinone 0.5 mcg through PICC and will get weekly labs.   - Not on BB with history of low output and milrinone.  - He is not on ACE-I due to rash and no longer on ARB or spironolactone with renal function. - Continue current spironolactone to help with hypokalemia  - Discussed the importance of daily weights, a low sodium diet, and fluid restriction (less than 2 L a day). Instructed to call the HF clinic if weight increases more than 3 lbs overnight or 5 lbs in a week.  2. Paroxysmal atrial fibrillation/flutter: Cardioverted back to NSR 12/27/13. Appears to be in NSR on amiodraone. Will continue amio 200 mg BID. Continue coumadin.  Will need to follow LFTs and TFTs.  3. Pulmonary arterial hypertension: Noted on RHC 02/21/13.  Continue revatio 20 mg TID 40 mg and milrinone.  4. CKD stage III: Creatinine improved on milrinone. Will recheck.  5. Abdominal discomfort/distension: This persists. Will start reglan 10 mg bid. Get gastric emptying study and refer to GI.  6. Hypokalemia: Arlyce Harman increased will recheck BMET   Benay Spice 07/31/2014

## 2014-08-03 LAB — POCT INR: INR: 1.7

## 2014-08-04 ENCOUNTER — Telehealth (HOSPITAL_COMMUNITY): Payer: Self-pay

## 2014-08-04 ENCOUNTER — Ambulatory Visit (INDEPENDENT_AMBULATORY_CARE_PROVIDER_SITE_OTHER): Payer: Medicare Other | Admitting: Cardiology

## 2014-08-04 DIAGNOSIS — I4891 Unspecified atrial fibrillation: Secondary | ICD-10-CM

## 2014-08-04 NOTE — Telephone Encounter (Signed)
Called [facility] RN [name] regarding recen  lab work.  Per [name] Clegg NP-C, advised to take extra 69meq potassium today for K of 2.9 on 08/03/14. [place] RN voiced understanding and will let patient know. Informed that patient is still not compliant with medications.  During her visit with him yesterday she arrived to the home around 3:00pm.  Patient had not taken any of his medications for that day and had drank multiple cans of Crush Ethelle Lyon. Ensured that she reinforced diet, fluid restriction, and medication management importance with patient.  Renee Pain

## 2014-08-12 ENCOUNTER — Ambulatory Visit (INDEPENDENT_AMBULATORY_CARE_PROVIDER_SITE_OTHER): Payer: Medicare Other | Admitting: Pharmacist Clinician (PhC)/ Clinical Pharmacy Specialist

## 2014-08-12 DIAGNOSIS — I4891 Unspecified atrial fibrillation: Secondary | ICD-10-CM

## 2014-08-12 LAB — POCT INR: INR: 2

## 2014-08-13 ENCOUNTER — Encounter: Payer: Self-pay | Admitting: Internal Medicine

## 2014-08-13 ENCOUNTER — Emergency Department (HOSPITAL_COMMUNITY): Payer: Medicare Other

## 2014-08-13 ENCOUNTER — Inpatient Hospital Stay (HOSPITAL_COMMUNITY)
Admission: EM | Admit: 2014-08-13 | Discharge: 2014-08-17 | DRG: 292 | Disposition: A | Discharge status: Home Health | Payer: Medicare Other | Attending: Internal Medicine | Admitting: Internal Medicine

## 2014-08-13 ENCOUNTER — Encounter (HOSPITAL_COMMUNITY): Payer: Self-pay | Admitting: Emergency Medicine

## 2014-08-13 DIAGNOSIS — I480 Paroxysmal atrial fibrillation: Secondary | ICD-10-CM | POA: Diagnosis present

## 2014-08-13 DIAGNOSIS — G40909 Epilepsy, unspecified, not intractable, without status epilepticus: Secondary | ICD-10-CM | POA: Diagnosis present

## 2014-08-13 DIAGNOSIS — I129 Hypertensive chronic kidney disease with stage 1 through stage 4 chronic kidney disease, or unspecified chronic kidney disease: Secondary | ICD-10-CM | POA: Diagnosis present

## 2014-08-13 DIAGNOSIS — R42 Dizziness and giddiness: Secondary | ICD-10-CM | POA: Diagnosis not present

## 2014-08-13 DIAGNOSIS — E780 Pure hypercholesterolemia: Secondary | ICD-10-CM | POA: Diagnosis present

## 2014-08-13 DIAGNOSIS — K5900 Constipation, unspecified: Secondary | ICD-10-CM | POA: Diagnosis not present

## 2014-08-13 DIAGNOSIS — I272 Other secondary pulmonary hypertension: Secondary | ICD-10-CM | POA: Diagnosis present

## 2014-08-13 DIAGNOSIS — I509 Heart failure, unspecified: Secondary | ICD-10-CM | POA: Insufficient documentation

## 2014-08-13 DIAGNOSIS — Z8673 Personal history of transient ischemic attack (TIA), and cerebral infarction without residual deficits: Secondary | ICD-10-CM

## 2014-08-13 DIAGNOSIS — I252 Old myocardial infarction: Secondary | ICD-10-CM

## 2014-08-13 DIAGNOSIS — N183 Chronic kidney disease, stage 3 unspecified: Secondary | ICD-10-CM | POA: Diagnosis present

## 2014-08-13 DIAGNOSIS — N184 Chronic kidney disease, stage 4 (severe): Secondary | ICD-10-CM | POA: Diagnosis present

## 2014-08-13 DIAGNOSIS — Z888 Allergy status to other drugs, medicaments and biological substances status: Secondary | ICD-10-CM

## 2014-08-13 DIAGNOSIS — R1909 Other intra-abdominal and pelvic swelling, mass and lump: Secondary | ICD-10-CM | POA: Diagnosis present

## 2014-08-13 DIAGNOSIS — Z7901 Long term (current) use of anticoagulants: Secondary | ICD-10-CM

## 2014-08-13 DIAGNOSIS — Z66 Do not resuscitate: Secondary | ICD-10-CM | POA: Diagnosis present

## 2014-08-13 DIAGNOSIS — I2721 Secondary pulmonary arterial hypertension: Secondary | ICD-10-CM | POA: Diagnosis present

## 2014-08-13 DIAGNOSIS — E871 Hypo-osmolality and hyponatremia: Secondary | ICD-10-CM | POA: Diagnosis present

## 2014-08-13 DIAGNOSIS — Z8249 Family history of ischemic heart disease and other diseases of the circulatory system: Secondary | ICD-10-CM

## 2014-08-13 DIAGNOSIS — I959 Hypotension, unspecified: Secondary | ICD-10-CM | POA: Diagnosis present

## 2014-08-13 DIAGNOSIS — I5043 Acute on chronic combined systolic (congestive) and diastolic (congestive) heart failure: Secondary | ICD-10-CM | POA: Diagnosis present

## 2014-08-13 DIAGNOSIS — I452 Bifascicular block: Secondary | ICD-10-CM | POA: Diagnosis present

## 2014-08-13 DIAGNOSIS — Z95810 Presence of automatic (implantable) cardiac defibrillator: Secondary | ICD-10-CM

## 2014-08-13 DIAGNOSIS — E876 Hypokalemia: Secondary | ICD-10-CM | POA: Diagnosis not present

## 2014-08-13 LAB — BASIC METABOLIC PANEL
ANION GAP: 13 (ref 5–15)
BUN: 44 mg/dL — AB (ref 6–23)
CHLORIDE: 90 mmol/L — AB (ref 96–112)
CO2: 26 mmol/L (ref 19–32)
Calcium: 8.9 mg/dL (ref 8.4–10.5)
Creatinine, Ser: 1.37 mg/dL — ABNORMAL HIGH (ref 0.50–1.35)
GFR, EST AFRICAN AMERICAN: 55 mL/min — AB (ref >90)
GFR, EST NON AFRICAN AMERICAN: 47 mL/min — AB (ref >90)
Glucose, Bld: 153 mg/dL — ABNORMAL HIGH (ref 70–99)
Potassium: 3.5 mmol/L (ref 3.5–5.1)
SODIUM: 129 mmol/L — AB (ref 135–145)

## 2014-08-13 LAB — URINALYSIS, ROUTINE W REFLEX MICROSCOPIC
Bilirubin Urine: NEGATIVE
GLUCOSE, UA: NEGATIVE mg/dL
Hgb urine dipstick: NEGATIVE
Ketones, ur: NEGATIVE mg/dL
Leukocytes, UA: NEGATIVE
Nitrite: NEGATIVE
PH: 7 (ref 5.0–8.0)
Protein, ur: NEGATIVE mg/dL
Specific Gravity, Urine: 1.008 (ref 1.005–1.030)
Urobilinogen, UA: 0.2 mg/dL (ref 0.0–1.0)

## 2014-08-13 LAB — I-STAT TROPONIN, ED: Troponin i, poc: 0.01 ng/mL (ref 0.00–0.08)

## 2014-08-13 LAB — CBC
HCT: 37.6 % — ABNORMAL LOW (ref 39.0–52.0)
HEMOGLOBIN: 12.4 g/dL — AB (ref 13.0–17.0)
MCH: 26.9 pg (ref 26.0–34.0)
MCHC: 33 g/dL (ref 30.0–36.0)
MCV: 81.6 fL (ref 78.0–100.0)
Platelets: 359 10*3/uL (ref 150–400)
RBC: 4.61 MIL/uL (ref 4.22–5.81)
RDW: 18.6 % — ABNORMAL HIGH (ref 11.5–15.5)
WBC: 12.8 10*3/uL — ABNORMAL HIGH (ref 4.0–10.5)

## 2014-08-13 LAB — PROTIME-INR
INR: 1.75 — AB (ref 0.00–1.49)
PROTHROMBIN TIME: 20.6 s — AB (ref 11.6–15.2)

## 2014-08-13 LAB — BRAIN NATRIURETIC PEPTIDE: B Natriuretic Peptide: 2249.6 pg/mL — ABNORMAL HIGH (ref 0.0–100.0)

## 2014-08-13 MED ORDER — MILRINONE IN DEXTROSE 20 MG/100ML IV SOLN: 0.5000 ug/kg/min | INTRAVENOUS | Status: DC

## 2014-08-13 MED ORDER — SODIUM CHLORIDE 0.9 % IV SOLN: 250.0000 mL | INTRAVENOUS | Status: DC | PRN

## 2014-08-13 MED ORDER — OXYCODONE HCL 5 MG PO TABS: 10.0000 mg | ORAL_TABLET | ORAL | Status: DC | PRN

## 2014-08-13 MED ORDER — ACETAMINOPHEN 325 MG PO TABS: 650.0000 mg | ORAL_TABLET | Freq: Four times a day (QID) | ORAL | Status: DC | PRN

## 2014-08-13 MED ORDER — SODIUM CHLORIDE 0.9 % IJ SOLN: 3.0000 mL | INTRAMUSCULAR | Status: DC | PRN

## 2014-08-13 MED ORDER — SILDENAFIL CITRATE 20 MG PO TABS
20.0000 mg | ORAL_TABLET | Freq: Three times a day (TID) | ORAL | Status: DC
  Administered 2014-08-14 – 2014-08-17 (×12): 20 mg via ORAL
  Filled 2014-08-13 (×13): qty 1

## 2014-08-13 MED ORDER — POTASSIUM CHLORIDE CRYS ER 20 MEQ PO TBCR
20.0000 meq | EXTENDED_RELEASE_TABLET | Freq: Two times a day (BID) | ORAL | Status: DC
  Administered 2014-08-14 (×3): 20 meq via ORAL
  Filled 2014-08-13 (×5): qty 1

## 2014-08-13 MED ORDER — SORBITOL 70 % SOLN
30.0000 mL | Freq: Every day | Status: DC
  Administered 2014-08-14 – 2014-08-17 (×3): 30 mL via ORAL
  Filled 2014-08-13 (×4): qty 30

## 2014-08-13 MED ORDER — TRAMADOL HCL 50 MG PO TABS: 50.0000 mg | ORAL_TABLET | Freq: Two times a day (BID) | ORAL | Status: DC | PRN

## 2014-08-13 MED ORDER — SENNOSIDES-DOCUSATE SODIUM 8.6-50 MG PO TABS
2.0000 | ORAL_TABLET | Freq: Two times a day (BID) | ORAL | Status: DC
  Administered 2014-08-14 – 2014-08-17 (×7): 2 via ORAL
  Filled 2014-08-13 (×9): qty 2

## 2014-08-13 MED ORDER — MILRINONE IN DEXTROSE 20 MG/100ML IV SOLN
0.5000 ug/kg/min | INTRAVENOUS | Status: DC
  Administered 2014-08-14 – 2014-08-17 (×8): 0.5 ug/kg/min via INTRAVENOUS
  Filled 2014-08-13 (×9): qty 100

## 2014-08-13 MED ORDER — METOCLOPRAMIDE HCL 10 MG PO TABS
10.0000 mg | ORAL_TABLET | Freq: Two times a day (BID) | ORAL | Status: DC
  Administered 2014-08-14 – 2014-08-17 (×8): 10 mg via ORAL
  Filled 2014-08-13 (×9): qty 1

## 2014-08-13 MED ORDER — SENNA 8.6 MG PO TABS
2.0000 | ORAL_TABLET | Freq: Two times a day (BID) | ORAL | Status: DC
  Administered 2014-08-14 – 2014-08-17 (×8): 17.2 mg via ORAL
  Filled 2014-08-13 (×8): qty 2

## 2014-08-13 MED ORDER — SODIUM CHLORIDE 0.9 % IJ SOLN
3.0000 mL | Freq: Two times a day (BID) | INTRAMUSCULAR | Status: DC
  Administered 2014-08-14 – 2014-08-17 (×7): 3 mL via INTRAVENOUS

## 2014-08-13 MED ORDER — FUROSEMIDE 10 MG/ML IJ SOLN
120.0000 mg | Freq: Two times a day (BID) | INTRAVENOUS | Status: DC
  Administered 2014-08-14 – 2014-08-16 (×6): 120 mg via INTRAVENOUS
  Filled 2014-08-13 (×8): qty 12

## 2014-08-13 MED ORDER — ONDANSETRON HCL 4 MG/2ML IJ SOLN
4.0000 mg | Freq: Four times a day (QID) | INTRAMUSCULAR | Status: DC | PRN
  Administered 2014-08-15: 4 mg via INTRAVENOUS
  Filled 2014-08-13: qty 2

## 2014-08-13 MED ORDER — FUROSEMIDE 10 MG/ML IJ SOLN
40.0000 mg | Freq: Once | INTRAMUSCULAR | Status: AC
  Administered 2014-08-13: 40 mg via INTRAVENOUS
  Filled 2014-08-13: qty 4

## 2014-08-13 MED ORDER — MAGNESIUM OXIDE 400 (241.3 MG) MG PO TABS
400.0000 mg | ORAL_TABLET | Freq: Two times a day (BID) | ORAL | Status: DC
  Administered 2014-08-14 – 2014-08-17 (×8): 400 mg via ORAL
  Filled 2014-08-13 (×9): qty 1

## 2014-08-13 MED ORDER — AMIODARONE HCL 200 MG PO TABS
200.0000 mg | ORAL_TABLET | Freq: Every day | ORAL | Status: DC
  Administered 2014-08-14 – 2014-08-17 (×4): 200 mg via ORAL
  Filled 2014-08-13 (×4): qty 1

## 2014-08-13 MED ORDER — SPIRONOLACTONE 50 MG PO TABS
50.0000 mg | ORAL_TABLET | Freq: Every day | ORAL | Status: DC
  Administered 2014-08-14 – 2014-08-17 (×4): 50 mg via ORAL
  Filled 2014-08-13 (×4): qty 1

## 2014-08-13 MED ORDER — ACETAMINOPHEN 325 MG PO TABS
650.0000 mg | ORAL_TABLET | ORAL | Status: DC | PRN
  Filled 2014-08-13 (×2): qty 2

## 2014-08-13 NOTE — ED Notes (Signed)
Pt sitting on side of bed, no distress noted at this time. Pt given phone, speaking to family.

## 2014-08-13 NOTE — ED Notes (Signed)
Cardiology at bedside.

## 2014-08-13 NOTE — ED Notes (Signed)
Attempted to call report

## 2014-08-13 NOTE — H&P (Signed)
Tyler Christensen is an 81 y.o. male.    Chief Complaint: shortness of breath Primary Cardiologist: Dr. Haroldine Laws HPI: [name] is a 81 yo man with PMH of hypertension, chronic systolic HF with last known EF 20% with LBBB, pulmonary hypertension on revatio, hypertension, seizure disorder related to CVA 12/13, paroxysmal atrial fibrillation treated with warfarin who has a dry weight of 146 lbs and is on milrinone 0.5 mcg/kg/min who was recently admitted week of 1/16 with abdominal distension and pain who represents today with gradually increased shortness of breath. He denies difficulty with taking medications, he hasn't run out or had difficulty with his milrinone with a home health RN that checks on him but he's had some increased abdominal fullness, and increased SOB, dyspnea, PND and orthopnea. Some mild edema in his legs. He's also had significantly decreased UOP. He received lasix in the ER with some UOP. His weight really hasn't increased - nadir of 145 lbs, 146 lbs this AM.     Past Medical History  Diagnosis Date  . HTN (hypertension)   . High cholesterol   . CHF (congestive heart failure)   . BBBB (bilateral bundle branch block)   . LBBB (left bundle branch block)   . Seizures 06/11/2012    new onset/notes (06/11/2012)  . SOB (shortness of breath)     "sometimes when I lay down;; related to not taking my RX" (06/11/2012)  . Myocardial infarction     06/10/2012  . Atrial fibrillation   . Stroke   . Atrial thrombus     left  . Pulmonary HTN   . Atrial fibrillation 06/12/2012    Past Surgical History  Procedure Laterality Date  . Throat surgery  1942    "and nose" (06/11/2012); ?T&A  . Inguinal hernia repair  ~ 2008    "both sides" (06/11/2012)  . Tee without cardioversion  06/29/2012    Procedure: TRANSESOPHAGEAL ECHOCARDIOGRAM (TEE);  Surgeon: Jolaine Artist, MD;  Location: [facility] ENDOSCOPY;  Service: Cardiovascular;  Laterality: N/A;  will need a spanish  interpreter Interpreter will be here at 1330-Hope spoke w/ [place]  . Cardioversion N/A 08/14/2012    Procedure: CARDIOVERSION;  Surgeon: Jolaine Artist, MD;  Location: [facility] ENDOSCOPY;  Service: Cardiovascular;  Laterality: N/A;  . Tee without cardioversion N/A 11/12/2013    Procedure: TRANSESOPHAGEAL ECHOCARDIOGRAM (TEE);  Surgeon: Candee Furbish, MD;  Location: [facility] ENDOSCOPY;  Service: Cardiovascular;  Laterality: N/A;  . Tee without cardioversion N/A 11/19/2013    Procedure: TRANSESOPHAGEAL ECHOCARDIOGRAM (TEE);  Surgeon: Jolaine Artist, MD;  Location: [facility] ENDOSCOPY;  Service: Cardiovascular;  Laterality: N/A;  . Cardioversion N/A 12/27/2013    Procedure: CARDIOVERSION;  Surgeon: Jolaine Artist, MD;  Location: [facility] ENDOSCOPY;  Service: Cardiovascular;  Laterality: N/A;  . Left and right heart catheterization with coronary angiogram N/A 07/02/2012    Procedure: LEFT AND RIGHT HEART CATHETERIZATION WITH CORONARY ANGIOGRAM;  Surgeon: Jolaine Artist, MD;  Location[facility] CATH LAB;  Service: Cardiovascular;  Laterality: N/A;  . Right heart catheterization N/A 07/06/2012    Procedure: RIGHT HEART CATH;  Surgeon: Jolaine Artist, MD;  Location: [facility] CATH LAB;  Service: Cardiovascular;  Laterality: N/A;  . Bi-ventricular implantable cardioverter defibrillator N/A 10/26/2012    Procedure: BI-VENTRICULAR IMPLANTABLE CARDIOVERTER DEFIBRILLATOR  (CRT-D);  Surgeon: Evans Lance, MD;  Location: [facility] CATH LAB;  Service: Cardiovascular;  Laterality: N/A;    Family History  Problem Relation Age of Onset  . Heart attack  father had MI in his 15s, no fhx of early heart problem before age 43  . Heart attack      mother died of MI around 24   Social History:  reports that he has never smoked. He has never used smokeless tobacco. He reports that he does not drink alcohol or use illicit drugs.  Allergies:  Allergies  Allergen Reactions  . Lisinopril Rash     (Not in a hospital admission)  Results  for orders placed or performed during the hospital encounter of 08/13/14 (from the past 48 hour(s))  CBC     Status: Abnormal   Collection Time: 08/13/14  6:54 PM  Result Value Ref Range   WBC 12.8 (H) 4.0 - 10.5 K/uL   RBC 4.61 4.22 - 5.81 MIL/uL   Hemoglobin 12.4 (L) 13.0 - 17.0 g/dL   HCT 37.6 (L) 39.0 - 52.0 %   MCV 81.6 78.0 - 100.0 fL   MCH 26.9 26.0 - 34.0 pg   MCHC 33.0 30.0 - 36.0 g/dL   RDW 18.6 (H) 11.5 - 15.5 %   Platelets 359 150 - 400 K/uL  Basic metabolic panel     Status: Abnormal   Collection Time: 08/13/14  6:54 PM  Result Value Ref Range   Sodium 129 (L) 135 - 145 mmol/L   Potassium 3.5 3.5 - 5.1 mmol/L   Chloride 90 (L) 96 - 112 mmol/L   CO2 26 19 - 32 mmol/L   Glucose, Bld 153 (H) 70 - 99 mg/dL   BUN 44 (H) 6 - 23 mg/dL   Creatinine, Ser 1.37 (H) 0.50 - 1.35 mg/dL   Calcium 8.9 8.4 - 10.5 mg/dL   GFR calc non Af Amer 47 (L) >90 mL/min   GFR calc Af Amer 55 (L) >90 mL/min    Comment: (NOTE) The eGFR has been calculated using the CKD EPI equation. This calculation has not been validated in all clinical situations. eGFR's persistently <90 mL/min signify possible Chronic Kidney Disease.    Anion gap 13 5 - 15  Brain natriuretic peptide     Status: Abnormal   Collection Time: 08/13/14  7:21 PM  Result Value Ref Range   B Natriuretic Peptide 2249.6 (H) 0.0 - 100.0 pg/mL  Protime-INR     Status: Abnormal   Collection Time: 08/13/14  7:21 PM  Result Value Ref Range   Prothrombin Time 20.6 (H) 11.6 - 15.2 seconds   INR 1.75 (H) 0.00 - 1.49  I-stat troponin, ED (not at [facility])     Status: None   Collection Time: 08/13/14  7:25 PM  Result Value Ref Range   Troponin i, poc 0.01 0.00 - 0.08 ng/mL   Comment 3            Comment: Due to the release kinetics of cTnI, a negative result within the first hours of the onset of symptoms does not rule out myocardial infarction with certainty. If myocardial infarction is still suspected, repeat the test at appropriate  intervals.    Dg Chest 2 View  08/13/2014   CLINICAL DATA:  Chest pressure short of breath since last night.  EXAM: CHEST  2 VIEW  COMPARISON:  Chest radiograph 07/04/2014  FINDINGS: Left-sided pacemaker overlies normal cardiac silhouette. There is fine interstitial pattern which is improved from comparison exam. Small bilateral pleural effusions present. No pneumothorax. Right central venous line is noted.  IMPRESSION: 1. Improvement in interstitial edema pattern. 2. Small effusions.   Electronically Signed  By: Suzy Bouchard M.D.   On: 08/13/2014 19:39    Review of Systems  Constitutional: Positive for malaise/fatigue. Negative for fever, chills, weight loss and diaphoresis.  HENT: Negative for tinnitus.   Eyes: Negative for double vision and photophobia.  Respiratory: Positive for shortness of breath. Negative for hemoptysis and sputum production.   Cardiovascular: Positive for chest pain, orthopnea and PND.  Gastrointestinal: Positive for abdominal pain. Negative for nausea, vomiting and diarrhea.  Genitourinary: Negative for dysuria and hematuria.  Musculoskeletal: Negative for myalgias and neck pain.  Neurological: Positive for weakness. Negative for dizziness, tingling, tremors and headaches.  Endo/Heme/Allergies: Negative for polydipsia. Bruises/bleeds easily.  Psychiatric/Behavioral: Negative for depression, suicidal ideas and substance abuse.    Blood pressure 106/70, pulse 86, temperature 98 F (36.7 C), temperature source Oral, resp. rate 15, SpO2 96 %. Physical Exam  Nursing note and vitals reviewed. Constitutional: He is oriented to person, place, and time. He appears distressed.  Thin man, appears stated age, appears in mild distress, mildly tachypneic  HENT:  Head: Normocephalic and atraumatic.  Nose: Nose normal.  Mouth/Throat: No oropharyngeal exudate.  Eyes: Conjunctivae and EOM are normal. Pupils are equal, round, and reactive to light. No scleral icterus.   Neck: Normal range of motion. Neck supple. JVD present.  jvp midneck with slight HJR  Cardiovascular: Normal rate, regular rhythm and intact distal pulses.  Exam reveals no gallop.   No murmur heard. Respiratory: Effort normal. He has rales.  Occasional rales at the bases that clear superiorly  GI: Soft. Bowel sounds are normal. He exhibits distension. There is no tenderness.  Musculoskeletal: Normal range of motion. He exhibits edema. He exhibits no tenderness.  Trace edema  Neurological: He is alert and oriented to person, place, and time. No cranial nerve deficit. Coordination normal.  Skin: Skin is dry. No rash noted. He is not diaphoretic. No erythema.  Lukewarm extremities  Psychiatric: He has a normal mood and affect. His behavior is normal. Judgment and thought content normal.   labs reviewed; wbc 12.8/h/h 12.4/37.6, plt 359, na 129, K 3.5, bun/cr 44/1.37, INR 1.75, BNP 2500s, Trop 0.01, chest x-ray Improved from previous, slight pulmonary congestion, ECG: A-sensed, biv paced   Assessment/Plan Problem List Acute on chronic systolic/diastolic heart failure paroxysmal atrial fibrillation on chronic anticoagulation Chronic kidney disease stage III Elevated BNP Pulmonary hypertension Chest Pain  [name] is a 81 yo man with PMH of hypertension, chronic systolic HF with last known EF 20% with LBBB, pulmonary hypertension on revatio, hypertension, seizure disorder related to CVA 12/13, paroxysmal atrial fibrillation treated with warfarin here with increased shortness of breath consistent with worsening heart failure. I favor a diagnosis of worsening heart failure vs. Worsening pulmonary hypertension. Perhaps both. Either way, gentle diuresis is in store. He has class IV NYHA HF requiring milrinone. He ultimately may be progressing with his HF. Will attempt to diuresis for now. Also with slight leukocytosis but not infected on clinical exam. He is known DNR and he and I had a  similar discuss confirming his wishes (DNR).  - admit to telemetry, trend biomarkers - 120 mg IV lasix now and bid written for - continue milrinon 0.5 mcg/kg/min - continue spironolactone 50 mg daily - warfarin daily, pharmacy to assist with dosing - continue revatio 20 mg tid for PHTN - obtain urinalysis  - no acei-/arb given allergy/hypotension/renal failure - no bb given hypotension/decompensation/prior worsening  Aleanna Menge 08/13/2014, 11:00 PM

## 2014-08-13 NOTE — ED Notes (Signed)
Pt reports chest pressure and sob since last night. Pt is taking Milrinone. Denies fever/chills.

## 2014-08-13 NOTE — ED Provider Notes (Signed)
CSN: [id number]     Arrival date & time 08/13/14  1836 History   None    Chief Complaint  Patient presents with  . Chest Pain   (Consider location/radiation/quality/duration/timing/severity/associated sxs/prior Treatment) HPI Comments: 81 yo M hx of HTN, HLD, CHF with EF 15% with Biventricular defibrillator, CAD, atrial fibrillation on anticoagulation, CVA, pulmonary HTN, presents with CC SOB.  Pt states symptoms started last evening.  He complains of dyspnea at rest, worse with exertion.  Denies any new weight gain.  Denies fever, chills, cough, CP or discomfort, cough, abdominal pain, nausea, vomiting, diarrhea, rash, myalgias, or any other symptoms.  Pt has been taking medications as prescribed.  Denies diet change.  No other concerns.   The history is provided by the patient. A language interpreter was used.    Past Medical History  Diagnosis Date  . HTN (hypertension)   . High cholesterol   . CHF (congestive heart failure)   . BBBB (bilateral bundle branch block)   . LBBB (left bundle branch block)   . Seizures 06/11/2012    new onset/notes (06/11/2012)  . SOB (shortness of breath)     "sometimes when I lay down;; related to not taking my RX" (06/11/2012)  . Myocardial infarction     06/10/2012  . Atrial fibrillation   . Stroke   . Atrial thrombus     left  . Pulmonary HTN   . Atrial fibrillation 06/12/2012   Past Surgical History  Procedure Laterality Date  . Throat surgery  1940/10/17    "and nose" (06/11/2012); ?T&A  . Inguinal hernia repair  ~ Oct 18, 2006    "both sides" (06/11/2012)  . Tee without cardioversion  06/29/2012    Procedure: TRANSESOPHAGEAL ECHOCARDIOGRAM (TEE);  Surgeon: Jolaine Artist, MD;  Location: [facility] ENDOSCOPY;  Service: Cardiovascular;  Laterality: N/A;  will need a spanish interpreter Interpreter will be here at 1330-Hope spoke w/ [place]  . Cardioversion N/A 08/14/2012    Procedure: CARDIOVERSION;  Surgeon: Jolaine Artist, MD;  Location: [facility] ENDOSCOPY;   Service: Cardiovascular;  Laterality: N/A;  . Tee without cardioversion N/A 11/12/2013    Procedure: TRANSESOPHAGEAL ECHOCARDIOGRAM (TEE);  Surgeon: Candee Furbish, MD;  Location: [facility] ENDOSCOPY;  Service: Cardiovascular;  Laterality: N/A;  . Tee without cardioversion N/A 11/19/2013    Procedure: TRANSESOPHAGEAL ECHOCARDIOGRAM (TEE);  Surgeon: Jolaine Artist, MD;  Location: [facility] ENDOSCOPY;  Service: Cardiovascular;  Laterality: N/A;  . Cardioversion N/A 12/27/2013    Procedure: CARDIOVERSION;  Surgeon: Jolaine Artist, MD;  Location: [facility] ENDOSCOPY;  Service: Cardiovascular;  Laterality: N/A;  . Left and right heart catheterization with coronary angiogram N/A 07/02/2012    Procedure: LEFT AND RIGHT HEART CATHETERIZATION WITH CORONARY ANGIOGRAM;  Surgeon: Jolaine Artist, MD;  Location: [facility] CATH LAB;  Service: Cardiovascular;  Laterality: N/A;  . Right heart catheterization N/A 07/06/2012    Procedure: RIGHT HEART CATH;  Surgeon: Jolaine Artist, MD;  Location: [facility] CATH LAB;  Service: Cardiovascular;  Laterality: N/A;  . Bi-ventricular implantable cardioverter defibrillator N/A 10/26/2012    Procedure: BI-VENTRICULAR IMPLANTABLE CARDIOVERTER DEFIBRILLATOR  (CRT-D);  Surgeon: Evans Lance, MD;  Location: [facility] CATH LAB;  Service: Cardiovascular;  Laterality: N/A;   Family History  Problem Relation Age of Onset  . Heart attack      father had MI in his 67s, no fhx of early heart problem before age 52  . Heart attack      mother died of MI around 1998-10-18   History  Substance Use Topics  . Smoking status: Never Smoker   . Smokeless tobacco: Never Used  . Alcohol Use: No    Review of Systems  Constitutional: Negative for fever and chills.  Respiratory: Positive for shortness of breath. Negative for cough.   Cardiovascular: Negative for chest pain.  Gastrointestinal: Negative for nausea, vomiting, abdominal pain, diarrhea and constipation.  Musculoskeletal: Negative for myalgias.  Skin: Negative for  rash.  Neurological: Negative for dizziness, weakness, light-headedness, numbness and headaches.  Hematological: Negative for adenopathy. Does not bruise/bleed easily.  All other systems reviewed and are negative.     Allergies  Lisinopril  Home Medications   Prior to Admission medications   Medication Sig Start Date End Date Taking? Authorizing Provider  acetaminophen (TYLENOL) 325 MG tablet Take 2 tablets (650 mg total) by mouth every 6 (six) hours as needed for mild pain (or Fever >/= 101). 07/10/14   Kinnie Feil, MD  amiodarone (PACERONE) 200 MG tablet Take 1 tablet (200 mg total) by mouth daily. 07/10/14   Kinnie Feil, MD  magnesium oxide (MAG-OX) 400 MG tablet Take 1 tablet (400 mg total) by mouth 2 (two) times daily. 04/25/14   Jolaine Artist, MD  metoCLOPramide (REGLAN) 10 MG tablet Take 1 tablet (10 mg total) by mouth 2 (two) times daily. 07/31/14   Jolaine Artist, MD  metolazone (ZAROXOLYN) 2.5 MG tablet Take 2.5 mg by mouth 2 (two) times a week. Take Sunday and Thursday    Historical Provider, MD  milrinone (PRIMACOR) 20 MG/100ML SOLN infusion Inject 33.95 mcg/min into the vein continuous. 06/10/14   Rande Brunt, NP  Oxycodone HCl 10 MG TABS Take 10 mg by mouth every 4 (four) hours as needed (pain).    Historical Provider, MD  potassium chloride SA (K-DUR,KLOR-CON) 20 MEQ tablet Take 1 tablet (20 mEq total) by mouth 2 (two) times daily. 07/31/14   Shaune Pascal Bensimhon, MD  senna-docusate (SENOKOT-S) 8.6-50 MG per tablet Take 2 tablets by mouth 2 (two) times daily. 07/17/14   Jolaine Artist, MD  sildenafil (REVATIO) 20 MG tablet Take 1 tablet (20 mg total) by mouth 3 (three) times daily. 03/07/14   Rande Brunt, NP  sorbitol 70 % SOLN Take 30 mLs by mouth daily. 07/17/14   Jolaine Artist, MD  spironolactone (ALDACTONE) 25 MG tablet Take 2 tablets (50 mg total) by mouth daily. 07/22/14   Jolaine Artist, MD  torsemide (DEMADEX) 20 MG tablet Take 4 tablets (80  mg total) by mouth daily. 04/25/14   Jolaine Artist, MD  traMADol (ULTRAM) 50 MG tablet Take 1 tablet (50 mg total) by mouth 2 (two) times daily as needed. for pain 07/17/14   Jolaine Artist, MD  warfarin (COUMADIN) 4 MG tablet Take 4 mg Monday, Wednesday, Friday and Sunday. 2 mg on Tues, Thurs and Sat 06/10/14   Rande Brunt, NP   BP 108/53 mmHg  Pulse 99  Temp(Src) 98 F (36.7 C) (Oral)  Resp 20  SpO2 93% Physical Exam  Constitutional: He is oriented to person, place, and time. He appears well-developed and well-nourished.  HENT:  Head: Normocephalic and atraumatic.  Right Ear: External ear normal.  Left Ear: External ear normal.  Mouth/Throat: Oropharynx is clear and moist.  Eyes: Conjunctivae and EOM are normal. Pupils are equal, round, and reactive to light.  Neck: Normal range of motion. Neck supple. JVD present.  Cardiovascular: Normal rate, regular rhythm, normal heart sounds and intact  distal pulses.   Pulmonary/Chest: Effort normal. No respiratory distress. He has no wheezes. He has rales. He exhibits no tenderness.  Bibasilar crackles.   Abdominal: Soft. Bowel sounds are normal. He exhibits no distension and no mass. There is no tenderness. There is no rebound and no guarding.  Musculoskeletal: Normal range of motion.  Neurological: He is alert and oriented to person, place, and time.  Skin: Skin is warm and dry.  Nursing note and vitals reviewed.   ED Course  Procedures (including critical care time) Labs Review Labs Reviewed  CBC - Abnormal; Notable for the following:    WBC 12.8 (*)    Hemoglobin 12.4 (*)    HCT 37.6 (*)    RDW 18.6 (*)    All other components within normal limits  BASIC METABOLIC PANEL - Abnormal; Notable for the following:    Sodium 129 (*)    Chloride 90 (*)    Glucose, Bld 153 (*)    BUN 44 (*)    Creatinine, Ser 1.37 (*)    GFR calc non Af Amer 47 (*)    GFR calc Af Amer 55 (*)    All other components within normal limits   BRAIN NATRIURETIC PEPTIDE - Abnormal; Notable for the following:    B Natriuretic Peptide 2249.6 (*)    All other components within normal limits  PROTIME-INR - Abnormal; Notable for the following:    Prothrombin Time 20.6 (*)    INR 1.75 (*)    All other components within normal limits  URINALYSIS, ROUTINE W REFLEX MICROSCOPIC  BASIC METABOLIC PANEL  I-STAT TROPOININ, ED    Imaging Review Dg Chest 2 View  08/13/2014   CLINICAL DATA:  Chest pressure short of breath since last night.  EXAM: CHEST  2 VIEW  COMPARISON:  Chest radiograph 07/04/2014  FINDINGS: Left-sided pacemaker overlies normal cardiac silhouette. There is fine interstitial pattern which is improved from comparison exam. Small bilateral pleural effusions present. No pneumothorax. Right central venous line is noted.  IMPRESSION: 1. Improvement in interstitial edema pattern. 2. Small effusions.   Electronically Signed   By: Suzy Bouchard M.D.   On: 08/13/2014 19:39     EKG Interpretation   Date/Time:  Wednesday August 13 2014 18:41:52 EST Ventricular Rate:  100 PR Interval:  134 QRS Duration: 184 QT Interval:  446 QTC Calculation: 575 R Axis:   -138 Text Interpretation:  Atrial-sensed ventricular-paced rhythm Biventricular  pacemaker detected Abnormal ECG Confirmed by Jeneen Rinks  MD, [place] ([id number]) on  08/13/2014 7:28:45 PM      MDM   Final diagnoses:  Congestive heart failure, unspecified congestive heart failure chronicity, unspecified congestive heart failure type   81 yo M hx of HTN, HLD, CHF with EF 15% with Biventricular defibrillator, CAD, atrial fibrillation on anticoagulation, CVA, pulmonary HTN, presents with CC SOB.  Physical exam as above.  VS WNL.  Pt appears dyspneic at rest, but does not appear toxic looking.  Some fluid overload appreciated with some JVD, and bibasilar crackles.  Pt oxygenating well however.    EKG appears similar to prior, paced rhythm.  Troponin was WNL.  BNP was elevated to  2249, with no priors.  CBC with mild leukocytosis, mild normocytic anemia.  Pt hyponatremic as well 129, hypochloremic 90 similar to prior. Also chronic elevation in BUN/Cr, without acute change present.    CXR with some interstitial edema, small effusions.    Question exacerbation of chronic heart failure.  Pt given 40 mg  Lasix in ED with good diuresis.   Cardiology consulted.  Will admit pt to heart failure service.  Pt understands and agrees with plan.   Sinda Du  Discussed pt with my attending Dr. Jeneen Rinks.     Sinda Du, MD 08/14/14 0923  Tanna Furry, MD 08/23/14 [phone]

## 2014-08-14 DIAGNOSIS — I252 Old myocardial infarction: Secondary | ICD-10-CM | POA: Diagnosis not present

## 2014-08-14 DIAGNOSIS — I129 Hypertensive chronic kidney disease with stage 1 through stage 4 chronic kidney disease, or unspecified chronic kidney disease: Secondary | ICD-10-CM | POA: Diagnosis present

## 2014-08-14 DIAGNOSIS — I272 Other secondary pulmonary hypertension: Secondary | ICD-10-CM | POA: Diagnosis present

## 2014-08-14 DIAGNOSIS — I959 Hypotension, unspecified: Secondary | ICD-10-CM | POA: Diagnosis present

## 2014-08-14 DIAGNOSIS — K5900 Constipation, unspecified: Secondary | ICD-10-CM | POA: Diagnosis not present

## 2014-08-14 DIAGNOSIS — I452 Bifascicular block: Secondary | ICD-10-CM | POA: Diagnosis present

## 2014-08-14 DIAGNOSIS — Z8249 Family history of ischemic heart disease and other diseases of the circulatory system: Secondary | ICD-10-CM | POA: Diagnosis not present

## 2014-08-14 DIAGNOSIS — R42 Dizziness and giddiness: Secondary | ICD-10-CM | POA: Diagnosis not present

## 2014-08-14 DIAGNOSIS — E876 Hypokalemia: Secondary | ICD-10-CM | POA: Diagnosis not present

## 2014-08-14 DIAGNOSIS — N184 Chronic kidney disease, stage 4 (severe): Secondary | ICD-10-CM | POA: Diagnosis present

## 2014-08-14 DIAGNOSIS — E780 Pure hypercholesterolemia: Secondary | ICD-10-CM | POA: Diagnosis present

## 2014-08-14 DIAGNOSIS — G40909 Epilepsy, unspecified, not intractable, without status epilepticus: Secondary | ICD-10-CM | POA: Diagnosis present

## 2014-08-14 DIAGNOSIS — Z8673 Personal history of transient ischemic attack (TIA), and cerebral infarction without residual deficits: Secondary | ICD-10-CM | POA: Diagnosis not present

## 2014-08-14 DIAGNOSIS — I480 Paroxysmal atrial fibrillation: Secondary | ICD-10-CM | POA: Diagnosis present

## 2014-08-14 DIAGNOSIS — Z888 Allergy status to other drugs, medicaments and biological substances status: Secondary | ICD-10-CM | POA: Diagnosis not present

## 2014-08-14 DIAGNOSIS — I5043 Acute on chronic combined systolic (congestive) and diastolic (congestive) heart failure: Principal | ICD-10-CM

## 2014-08-14 DIAGNOSIS — Z7901 Long term (current) use of anticoagulants: Secondary | ICD-10-CM | POA: Diagnosis not present

## 2014-08-14 DIAGNOSIS — Z66 Do not resuscitate: Secondary | ICD-10-CM | POA: Diagnosis present

## 2014-08-14 DIAGNOSIS — R1909 Other intra-abdominal and pelvic swelling, mass and lump: Secondary | ICD-10-CM | POA: Diagnosis present

## 2014-08-14 DIAGNOSIS — Z95810 Presence of automatic (implantable) cardiac defibrillator: Secondary | ICD-10-CM | POA: Diagnosis not present

## 2014-08-14 DIAGNOSIS — E871 Hypo-osmolality and hyponatremia: Secondary | ICD-10-CM | POA: Diagnosis present

## 2014-08-14 LAB — BASIC METABOLIC PANEL
Anion gap: 13 (ref 5–15)
BUN: 42 mg/dL — ABNORMAL HIGH (ref 6–23)
CO2: 26 mmol/L (ref 19–32)
Calcium: 8.7 mg/dL (ref 8.4–10.5)
Chloride: 90 mmol/L — ABNORMAL LOW (ref 96–112)
Creatinine, Ser: 1.31 mg/dL (ref 0.50–1.35)
GFR, EST AFRICAN AMERICAN: 58 mL/min — AB (ref >90)
GFR, EST NON AFRICAN AMERICAN: 50 mL/min — AB (ref >90)
GLUCOSE: 150 mg/dL — AB (ref 70–99)
POTASSIUM: 2.8 mmol/L — AB (ref 3.5–5.1)
SODIUM: 129 mmol/L — AB (ref 135–145)

## 2014-08-14 LAB — CARBOXYHEMOGLOBIN
Carboxyhemoglobin: 1.5 % (ref 0.5–1.5)
Methemoglobin: 0.9 % (ref 0.0–1.5)
O2 SAT: 59.2 %
TOTAL HEMOGLOBIN: 10.5 g/dL — AB (ref 13.5–18.0)

## 2014-08-14 LAB — PROTIME-INR
INR: 1.95 — ABNORMAL HIGH (ref 0.00–1.49)
PROTHROMBIN TIME: 22.4 s — AB (ref 11.6–15.2)

## 2014-08-14 MED ORDER — SODIUM CHLORIDE 0.9 % IJ SOLN
10.0000 mL | INTRAMUSCULAR | Status: DC | PRN
  Administered 2014-08-14 (×2): 20 mL
  Administered 2014-08-15 – 2014-08-16 (×3): 10 mL
  Administered 2014-08-17: 20 mL
  Filled 2014-08-14 (×5): qty 40

## 2014-08-14 MED ORDER — WARFARIN SODIUM 4 MG PO TABS
4.0000 mg | ORAL_TABLET | Freq: Once | ORAL | Status: AC
  Administered 2014-08-14: 4 mg via ORAL
  Filled 2014-08-14: qty 1

## 2014-08-14 MED ORDER — WARFARIN SODIUM 4 MG PO TABS
4.0000 mg | ORAL_TABLET | ORAL | Status: AC
  Administered 2014-08-14: 4 mg via ORAL
  Filled 2014-08-14: qty 1

## 2014-08-14 MED ORDER — POTASSIUM CHLORIDE CRYS ER 20 MEQ PO TBCR
40.0000 meq | EXTENDED_RELEASE_TABLET | ORAL | Status: AC
  Administered 2014-08-14 (×2): 40 meq via ORAL
  Filled 2014-08-14 (×2): qty 2

## 2014-08-14 MED ORDER — POTASSIUM CHLORIDE CRYS ER 20 MEQ PO TBCR
40.0000 meq | EXTENDED_RELEASE_TABLET | ORAL | Status: AC
  Administered 2014-08-14 (×3): 40 meq via ORAL
  Filled 2014-08-14 (×4): qty 2

## 2014-08-14 MED ORDER — WARFARIN - PHARMACIST DOSING INPATIENT
Freq: Every day | Status: DC
  Administered 2014-08-15: 18:00:00

## 2014-08-14 NOTE — Clinical Documentation Improvement (Signed)
Presents with Acute on Chronic Systolic/Diastolic Heart Failure with CKD4 and Pulmonary Hypertension.   There is an assumed link between hypertension and CKD  There is no assumption made between Hypertension and Heart Disease  Please clarify if your patient has;  Hypertensive Heart and CKD with CHF  Hypertensive CKD with CHF as documented  Cardiorenal Syndrome  Other Condition  Cannot Clinically Determine  Please document findings in next progress note and include in discharge summary if applicable.  Thank You, Zoila Shutter ,RN Clinical Documentation Specialist:  [facility] [employer]

## 2014-08-14 NOTE — Progress Notes (Signed)
UR completed 

## 2014-08-14 NOTE — Progress Notes (Signed)
[facility]  Patient Status: Active pt with [facility] prior to hospital admission  [facility] is providing the following services: [place] for home Milrinone.  [facility] hospital team will follow pt and support transition home as ordered by HF Team.  If patient discharges after hours, please call [phone].   Tyler Christensen 08/14/2014, 8:12 AM

## 2014-08-14 NOTE — Progress Notes (Signed)
Advanced Heart Failure Rounding Note   Subjective:    Admitted last night with recurrent HF.   Weight down 1 pound with IV lasix. Feeling a little better. Ab sore and feels constipated.     Objective:   Weight Range:  Vital Signs:   Temp:  [97.6 F (36.4 C)-98 F (36.7 C)] 97.6 F (36.4 C) (02/25 0425) Pulse Rate:  [80-99] 83 (02/25 0425) Resp:  [15-31] 20 (02/25 0425) BP: (94-109)/(53-86) 95/61 mmHg (02/25 0425) SpO2:  [91 %-100 %] 97 % (02/25 0425) Weight:  [139 lb 4.8 oz (63.186 kg)-140 lb 9.6 oz (63.776 kg)] 139 lb 4.8 oz (63.186 kg) (02/25 0025) Last BM Date: 08/13/14  Weight change: Filed Weights   08/13/14 2252 08/14/14 0025  Weight: 140 lb 9.6 oz (63.776 kg) 139 lb 4.8 oz (63.186 kg)    Intake/Output:   Intake/Output Summary (Last 24 hours) at 08/14/14 0940 Last data filed at 08/14/14 0933  Gross per 24 hour  Intake 1140.88 ml  Output    525 ml  Net 615.88 ml     Physical Exam: General: Elderly, chronically ill appearing. NAD. Sitting in chair. No resp difficulty; interpreter present HEENT: normal Neck: supple. JVP to jaw; Carotids 2+ bilaterally; no bruits. No lymphadenopathy or thryomegaly appreciated.  Cor: PMI laterally displaced. Regular rhythm and rate. No s3 or murmur.  Lungs: clear Abdomen: soft, nontender, mildy distended. No hepatosplenomegaly. No bruits or masses. Good bowel sounds. Extremities: no cyanosis, clubbing, rash. RLE LLE trace edema.   Neuro: alert & orientedx3, cranial nerves grossly intact. Moves all 4 extremities w/o difficulty. Affect pleasant.  Telemetry: SR  Labs: Basic Metabolic Panel:  Recent Labs Lab 08/13/14 1854 08/14/14 0337  NA 129* 129*  K 3.5 2.8*  CL 90* 90*  CO2 26 26  GLUCOSE 153* 150*  BUN 44* 42*  CREATININE 1.37* 1.31  CALCIUM 8.9 8.7    Liver Function Tests: No results for input(s): AST, ALT, ALKPHOS, BILITOT, PROT, ALBUMIN in the last 168 hours. No results for input(s): LIPASE, AMYLASE  in the last 168 hours. No results for input(s): AMMONIA in the last 168 hours.  CBC:  Recent Labs Lab 08/13/14 1854  WBC 12.8*  HGB 12.4*  HCT 37.6*  MCV 81.6  PLT 359    Cardiac Enzymes: No results for input(s): CKTOTAL, CKMB, CKMBINDEX, TROPONINI in the last 168 hours.  BNP: BNP (last 3 results)  Recent Labs  08/13/14 1921  BNP 2249.6*    ProBNP (last 3 results)  Recent Labs  03/01/14 1959 03/08/14 2230 06/05/14 0332  PROBNP 11880.0* 15334.0* 19533.0*      Other results:    Imaging: Dg Chest 2 View  08/13/2014   CLINICAL DATA:  Chest pressure short of breath since last night.  EXAM: CHEST  2 VIEW  COMPARISON:  Chest radiograph 07/04/2014  FINDINGS: Left-sided pacemaker overlies normal cardiac silhouette. There is fine interstitial pattern which is improved from comparison exam. Small bilateral pleural effusions present. No pneumothorax. Right central venous line is noted.  IMPRESSION: 1. Improvement in interstitial edema pattern. 2. Small effusions.   Electronically Signed   By: Suzy Bouchard M.D.   On: 08/13/2014 19:39      Medications:     Scheduled Medications: . amiodarone  200 mg Oral Daily  . furosemide  120 mg Intravenous BID  . magnesium oxide  400 mg Oral BID  . metoCLOPramide  10 mg Oral BID WC  . potassium chloride SA  20 mEq Oral  BID  . potassium chloride  40 mEq Oral Q4H  . senna  2 tablet Oral BID  . senna-docusate  2 tablet Oral BID  . sildenafil  20 mg Oral TID  . sodium chloride  3 mL Intravenous Q12H  . sorbitol  30 mL Oral Daily  . spironolactone  50 mg Oral Daily  . Warfarin - Pharmacist Dosing Inpatient   Does not apply q1800     Infusions: . milrinone 0.5 mcg/kg/min (08/14/14 0800)     PRN Medications:  sodium chloride, acetaminophen, ondansetron (ZOFRAN) IV, oxyCODONE, sodium chloride, traMADol   Assessment:   1. Acute on chronic systolic HF (EF 51%) on home milrinone 2. Ab distension/constipation  3.  PAH 4. CKD, stage III-IV 5. PAF in NSR on amio 6. Retroperitoneal mass. 7. Hypokalemia 8. Hyponatremia 9. Hypocalcemia 10. DNR   Plan/Discussion:     He continues to deteriorate with end-stage biventricular failure. Weight is down but neck veins remain elevated. Continue IV diuresis for one more day. Supp K+. Treat with sorbitol for constipation. Check co-ox.    Length of Stay: 1   Glori Bickers MD 08/14/2014, 9:40 AM  Advanced Heart Failure Team Pager [phone] (M-F; 7a - 4p)  Please contact [facility] for night-coverage after hours (4p -7a ) and weekends on [email or web address]

## 2014-08-14 NOTE — Progress Notes (Signed)
ANTICOAGULATION CONSULT NOTE - Follow Up Consult  Pharmacy Consult for Coumadin Indication: afib and LA thrombus  Allergies  Allergen Reactions  . Lisinopril Rash    Patient Measurements: Height: 5\' 8"  (172.7 cm) Weight: 139 lb 4.8 oz (63.186 kg) (scale b) IBW/kg (Calculated) : 68.4  Vital Signs: Temp: 97.6 F (36.4 C) (02/25 0425) Temp Source: Oral (02/25 0425) BP: 102/55 mmHg (02/25 1037) Pulse Rate: 83 (02/25 1037)  Labs:  Recent Labs  08/12/14 08/13/14 1854 08/13/14 1921 08/14/14 0337  HGB  --  12.4*  --   --   HCT  --  37.6*  --   --   PLT  --  359  --   --   LABPROT  --   --  20.6*  --   INR 2.0  --  1.75*  --   CREATININE  --  1.37*  --  1.31    Estimated Creatinine Clearance: 40.9 mL/min (by C-G formula based on Cr of 1.31).  Assessment: 81yom on coumadin pta for afib and LA thrombus, being admitted with HF exacerbation. INR on admission was below goal at 1.75 and 4mg  was given. Today's INR is still slightly below goal at 1.95.  Home dose: 4mg  MWFSun, 2mg  TTSat  Goal of Therapy:  INR 2-3 Monitor platelets by anticoagulation protocol: Yes   Plan:  1) Repeat coumadin 4mg  x 1 tonight 2) Daily INR  Deboraha Sprang 08/14/2014,3:07 PM

## 2014-08-14 NOTE — Progress Notes (Signed)
Patient arrived to unit from ED via stretcher. Patient alert, oriented and ambulatory with minimal assistance. Admission weight, vitals and assessment completed. Patient currently resting comfortably, chair alarm in use. Will continue to monitor. Tresa Endo

## 2014-08-14 NOTE — Plan of Care (Signed)
Problem: Phase I Progression Outcomes Goal: EF % per last Echo/documented,Core Reminder form on chart Outcome: Completed/Met Date Met:  08/14/14 06/2014 EF 15%

## 2014-08-14 NOTE — Progress Notes (Signed)
ANTICOAGULATION CONSULT NOTE - Initial Consult  Pharmacy Consult for  Coumadin Indication: h/o paroxysmal atrial fibrillation on chronic anticoagulation (coumadin)  Allergies  Allergen Reactions  . Lisinopril Rash    Patient Measurements: Height: 5' 8.11" (173 cm) (recorded on 07/18/14) Weight: 140 lb 9.6 oz (63.776 kg) IBW/kg (Calculated) : 68.65 Heparin Dosing Weight: 63.8kg  Vital Signs: Temp: 98 F (36.7 C) (02/24 1851) Temp Source: Oral (02/24 1851) BP: 105/86 mmHg (02/24 2330) Pulse Rate: 88 (02/24 2330)  Labs:  Recent Labs  08/12/14 08/13/14 1854 08/13/14 1921  HGB  --  12.4*  --   HCT  --  37.6*  --   PLT  --  359  --   LABPROT  --   --  20.6*  INR 2.0  --  1.75*  CREATININE  --  1.37*  --     Estimated Creatinine Clearance: 39.5 mL/min (by C-G formula based on Cr of 1.37).   Medical History: Past Medical History  Diagnosis Date  . HTN (hypertension)   . High cholesterol   . CHF (congestive heart failure)   . BBBB (bilateral bundle branch block)   . LBBB (left bundle branch block)   . Seizures 06/11/2012    new onset/notes (06/11/2012)  . SOB (shortness of breath)     "sometimes when I lay down;; related to not taking my RX" (06/11/2012)  . Myocardial infarction     06/10/2012  . Atrial fibrillation   . Stroke   . Atrial thrombus     left  . Pulmonary HTN   . Atrial fibrillation 06/12/2012    Medications:  Prescriptions prior to admission  Medication Sig Dispense Refill Last Dose  . acetaminophen (TYLENOL) 325 MG tablet Take 2 tablets (650 mg total) by mouth every 6 (six) hours as needed for mild pain (or Fever >/= 101).   unknown  . amiodarone (PACERONE) 200 MG tablet Take 1 tablet (200 mg total) by mouth daily. 30 tablet 0 08/12/2014 at Unknown time  . magnesium oxide (MAG-OX) 400 MG tablet Take 1 tablet (400 mg total) by mouth 2 (two) times daily. 60 tablet 3 08/12/2014 at Unknown time  . metoCLOPramide (REGLAN) 10 MG tablet Take 1 tablet  (10 mg total) by mouth 2 (two) times daily. 60 tablet 3 unknown  . metolazone (ZAROXOLYN) 2.5 MG tablet Take 2.5 mg by mouth 2 (two) times a week. Take Sunday and Thursday   08/10/2014  . milrinone (PRIMACOR) 20 MG/100ML SOLN infusion Inject 33.95 mcg/min into the vein continuous. 100 mL 0 08/13/2014 at Unknown time  . Oxycodone HCl 10 MG TABS Take 10 mg by mouth every 4 (four) hours as needed (pain).   08/12/2014 at Unknown time  . potassium chloride SA (K-DUR,KLOR-CON) 20 MEQ tablet Take 1 tablet (20 mEq total) by mouth 2 (two) times daily. 40 tablet 0 08/12/2014 at Unknown time  . senna (SENOKOT) 8.6 MG TABS tablet Take 2 tablets by mouth 2 (two) times daily.  0 08/12/2014 at Unknown time  . senna-docusate (SENOKOT-S) 8.6-50 MG per tablet Take 2 tablets by mouth 2 (two) times daily. 60 tablet 3 08/12/2014 at Unknown time  . sildenafil (REVATIO) 20 MG tablet Take 1 tablet (20 mg total) by mouth 3 (three) times daily. 90 tablet 3 08/12/2014 at Unknown time  . sorbitol 70 % SOLN Take 30 mLs by mouth daily. 500 mL 3 unknown  . spironolactone (ALDACTONE) 25 MG tablet Take 2 tablets (50 mg total) by mouth daily. [place]  tablet 3 08/12/2014 at Unknown time  . torsemide (DEMADEX) 20 MG tablet Take 4 tablets (80 mg total) by mouth daily. 120 tablet 3 08/12/2014 at Unknown time  . traMADol (ULTRAM) 50 MG tablet Take 1 tablet (50 mg total) by mouth 2 (two) times daily as needed. for pain 60 tablet 0 08/12/2014 at Unknown time  . warfarin (COUMADIN) 4 MG tablet Take 4 mg Monday, Wednesday, Friday and Sunday. 2 mg on Tues, Thurs and Sat 45 tablet 3 08/12/2014 at Unknown time    Assessment: 81 y.o male with h/o atrial fibrillation (PAF), on chronic coumadin PTA.  INR = 1.75 on admit today which is subtherapeutic. He presented today with gradually increased shortness of breath. Admitted with acute on chronic systolic/diastolic heart failure. No bleeding reported.  Hgb low at 12.4 and pltc wnl at 359K.   PTA/home dose of  coumadin is 4 mg every MWFSun and 2mg  every TTSat, with last dose taken PTA on 08/12/14.  Coumadin 2mg  dose was due last night.  Will give 4mg  now.   Goal of Therapy:  INR 2-3 Monitor platelets by anticoagulation protocol: Yes   Plan:  Give coumadin 4mg  x1 now.  Daily PT/INR Monitor for sign and symptoms of bleeding.  Thank you for allowing pharmacy to be part of this patients care team.. Nicole Cella, [place] Pharmacist Pager: [phone] 08/14/2014,12:31 AM

## 2014-08-15 DIAGNOSIS — N183 Chronic kidney disease, stage 3 (moderate): Secondary | ICD-10-CM

## 2014-08-15 DIAGNOSIS — I5023 Acute on chronic systolic (congestive) heart failure: Secondary | ICD-10-CM

## 2014-08-15 LAB — PROTIME-INR
INR: 2.07 — AB (ref 0.00–1.49)
PROTHROMBIN TIME: 23.5 s — AB (ref 11.6–15.2)

## 2014-08-15 LAB — BASIC METABOLIC PANEL
Anion gap: 9 (ref 5–15)
BUN: 45 mg/dL — ABNORMAL HIGH (ref 6–23)
CALCIUM: 8.5 mg/dL (ref 8.4–10.5)
CO2: 27 mmol/L (ref 19–32)
Chloride: 92 mmol/L — ABNORMAL LOW (ref 96–112)
Creatinine, Ser: 1.32 mg/dL (ref 0.50–1.35)
GFR calc Af Amer: 58 mL/min — ABNORMAL LOW (ref >90)
GFR calc non Af Amer: 50 mL/min — ABNORMAL LOW (ref >90)
Glucose, Bld: 134 mg/dL — ABNORMAL HIGH (ref 70–99)
Potassium: 3.6 mmol/L (ref 3.5–5.1)
SODIUM: 128 mmol/L — AB (ref 135–145)

## 2014-08-15 MED ORDER — ALPRAZOLAM 0.25 MG PO TABS
0.2500 mg | ORAL_TABLET | Freq: Every evening | ORAL | Status: DC | PRN
  Administered 2014-08-15: 0.25 mg via ORAL
  Filled 2014-08-15: qty 1

## 2014-08-15 MED ORDER — WARFARIN SODIUM 2 MG PO TABS
2.0000 mg | ORAL_TABLET | Freq: Once | ORAL | Status: AC
  Administered 2014-08-15: 2 mg via ORAL
  Filled 2014-08-15: qty 1

## 2014-08-15 MED ORDER — POTASSIUM CHLORIDE CRYS ER 20 MEQ PO TBCR
40.0000 meq | EXTENDED_RELEASE_TABLET | Freq: Three times a day (TID) | ORAL | Status: DC
  Administered 2014-08-15 – 2014-08-16 (×4): 40 meq via ORAL
  Filled 2014-08-15 (×6): qty 2

## 2014-08-15 MED ORDER — METOLAZONE 5 MG PO TABS
5.0000 mg | ORAL_TABLET | Freq: Once | ORAL | Status: AC
  Administered 2014-08-15: 5 mg via ORAL
  Filled 2014-08-15: qty 1

## 2014-08-15 NOTE — Progress Notes (Signed)
ANTICOAGULATION CONSULT NOTE - Follow Up Consult  Pharmacy Consult for Coumadin Indication: afib and LA thrombus  Allergies  Allergen Reactions  . Lisinopril Rash    Patient Measurements: Height: 5\' 8"  (172.7 cm) Weight: 138 lb 6.4 oz (62.778 kg) IBW/kg (Calculated) : 68.4  Vital Signs: Temp: 97.5 F (36.4 C) (02/26 0500) Temp Source: Oral (02/26 0500) BP: 116/69 mmHg (02/26 0749) Pulse Rate: 97 (02/26 0749)  Labs:  Recent Labs  08/13/14 1854 08/13/14 1921 08/14/14 0337 08/14/14 1455 08/15/14 0455  HGB 12.4*  --   --   --   --   HCT 37.6*  --   --   --   --   PLT 359  --   --   --   --   LABPROT  --  20.6*  --  22.4* 23.5*  INR  --  1.75*  --  1.95* 2.07*  CREATININE 1.37*  --  1.31  --  1.32    Estimated Creatinine Clearance: 40.3 mL/min (by C-G formula based on Cr of 1.32).  Assessment: 81yom admitted 08/13/2014  coumadin pta for afib and LA thrombus, being admitted with HF exacerbation. INR on admission was below goal at 1.75 pharmacy consulted to dose warfarin.  PMH: HTN, HLD, CHF, seizures, afib, LA thrombus, pulm HTN  Anticoagulation: coumadin pta for afib/LA thrombus, INR 1.75 on admit - INR now at goal Home dose: 4mg  MWFSun, 2mg  TTSat  Cardiovascular: EF 15% on home milrinone - admit w/ volume overload - on IV lasix 120mg  bid, metolazone 5 today Continue amiodarone, revatio, spiro PTA: tors 80 daily Admit wt 140, Today's wt 138  Gastrointestinal / Nutrition: metoclopramide  Nephrology:  K 3.6 on 20 bid + replace 40 q4h x 3, on po magox  Goal of Therapy:  INR 2-3 Monitor platelets by anticoagulation protocol: Yes   Plan:  1) Repeat coumadin 2 mg x 1 tonight 2) Daily INR  Thank you for allowing pharmacy to be a part of this patients care team.  Rowe Robert Pharm.D., BCPS, AQ-Cardiology Clinical Pharmacist 08/15/2014 9:31 AM Pager: [phone] Phone: [phone]

## 2014-08-15 NOTE — Evaluation (Signed)
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 PT is signing off. Thank you for this referral.     Follow Up Recommendations No PT follow up;Supervision - Intermittent    Equipment Recommendations  None recommended by PT    Recommendations for Other Services       Precautions / Restrictions        Mobility  Bed Mobility                  Transfers Overall transfer level: Modified independent Equipment used: Rolling walker (2 wheeled)                Ambulation/Gait Ambulation/Gait assistance: Modified independent (Device/Increase time) Ambulation Distance (Feet): 300 Feet Assistive device: Rolling walker (2 wheeled) Gait Pattern/deviations: Step-through pattern;Trunk flexed   Gait velocity interpretation: Below normal speed for age/gender General Gait Details: frequent standing rest breaks  Stairs            Wheelchair Mobility    Modified Rankin (Stroke Patients Only)       Balance Overall balance assessment: No apparent balance deficits (not formally assessed) (denies falls or near falls)                                           Pertinent Vitals/Pain Sa)2 on 3L 92-97% whle walking  Pain Assessment: No/denies pain    Home Living Family/patient expects to be discharged to:: Private residence Living Arrangements: Children Available Help at Discharge: Family;Available 24 hours/day Type of Home: House Home Access: Level entry     Home Layout: One level Home  Equipment: Walker - 2 wheels      Prior Function Level of Independence: Needs assistance   Gait / Transfers Assistance Needed: modified independent with RW  ADL's / Homemaking Assistance Needed: homemaking assist        Hand Dominance   Dominant Hand: Right    Extremity/Trunk Assessment   Upper Extremity Assessment: Generalized weakness           Lower Extremity Assessment: Generalized weakness      Cervical / Trunk Assessment: Kyphotic  Communication   Communication: Prefers language other than English (Spanish; PT able to speak Spanish)  Cognition Arousal/Alertness: Awake/alert Behavior During Therapy: WFL for tasks assessed/performed Overall Cognitive Status: Within Functional Limits for tasks assessed                      General Comments      Exercises        Assessment/Plan    PT Assessment Patent does not need any further PT services  PT Diagnosis Generalized weakness   PT Problem List    PT Treatment Interventions     PT Goals (Current goals can be found in the Care Plan section) Acute Rehab PT Goals PT Goal Formulation: All assessment and education complete, DC therapy    Frequency     Barriers to discharge        Co-evaluation  End of Session Equipment Utilized During Treatment: Gait belt;Oxygen Activity Tolerance: Patient tolerated treatment well Patient left: in chair;with call bell/phone within reach;with nursing/sitter in room;with chair alarm set           Time: 5486-2824 PT Time Calculation (min) (ACUTE ONLY): 24 min   Charges:   PT Evaluation $Initial PT Evaluation Tier I: 1 Procedure PT Treatments $Gait Training: 8-22 mins   PT G Codes:        Genevra Orne 09/05/2014, 4:45 PM Pager [phone]

## 2014-08-15 NOTE — Progress Notes (Signed)
Notified Dr. Radford Pax that patient was complaining of shortness of breath but also may be experiencing some anxiety for oxygen saturations were at 99%. Orders given for Xanax at night PRN. Administered medication as ordered. Will continue to monitor patient to end of shift.

## 2014-08-15 NOTE — Care Management Note (Signed)
    Page 1 of 1   08/17/2014     3:33:23 PM CARE MANAGEMENT NOTE 08/17/2014  Patient:  Tyler Christensen, Tyler Christensen   Account Number:  [id number]  Date Initiated:  08/15/2014  Documentation initiated by:  AMERSON,JULIE  Subjective/Objective Assessment:   Pt adm on 08/13/14 with CHF, SOB.  PTA, pt resides at home with family.  He speaks little/no english.  He is on home Milrinone, and is active with [facility].     Action/Plan:   Will follow for dc needs as pt progresses. [facility] aware of pt admission.   Anticipated DC Date:  08/19/2014   Anticipated DC Plan:  [place]  CM consult      Choice offered to / List presented to:          [facility] arranged  HH-1 RN      [place].   Status of service:  Completed, signed off Medicare Important Message given?   (If response is "NO", the following Medicare IM given date fields will be blank) Date Medicare IM given:   Medicare IM given by:   Date Additional Medicare IM given:   Additional Medicare IM given by:    Discharge Disposition:  [place]  Per UR Regulation:  Reviewed for med. necessity/level of care/duration of stay  If discussed at [place] of Stay Meetings, dates discussed:    Comments:  08/17/14 13:00 Cm met with pt in room and discussed discharge planning through [employer].  Pt's wife and [facility] RN will be in room between 16-17:00 to hook up milrinone.  Cm called [facility] rep, Lecretia to coordinate.  CM requested resumption orders.  No other CM needs were communicated.  Mariane Masters, BSN, [place].

## 2014-08-15 NOTE — Progress Notes (Addendum)
Advanced Heart Failure Rounding Note   Subjective:    Feeling better. Weight down 1 pound. Breathing better. Ab pain improved after BM x 3. Co-ox 59%    Objective:   Weight Range:  Vital Signs:   Temp:  [97.4 F (36.3 C)-97.7 F (36.5 C)] 97.5 F (36.4 C) (02/26 0500) Pulse Rate:  [83-100] 97 (02/26 0749) Resp:  [18-22] 22 (02/26 0500) BP: (100-116)/(55-83) 116/69 mmHg (02/26 0749) SpO2:  [93 %-100 %] 99 % (02/26 0749) Weight:  [62.778 kg (138 lb 6.4 oz)] 62.778 kg (138 lb 6.4 oz) (02/26 0749) Last BM Date: 08/14/14  Weight change: Filed Weights   08/13/14 2252 08/14/14 0025 08/15/14 0749  Weight: 63.776 kg (140 lb 9.6 oz) 63.186 kg (139 lb 4.8 oz) 62.778 kg (138 lb 6.4 oz)    Intake/Output:   Intake/Output Summary (Last 24 hours) at 08/15/14 0962 Last data filed at 08/15/14 0600  Gross per 24 hour  Intake 2831.2 ml  Output   1465 ml  Net 1366.2 ml     Physical Exam: General: Elderly, chronically ill appearing Sitting in chair. NAD.No resp difficulty;  HEENT: normal Neck: supple. JVP to jaw; Carotids 2+ bilaterally; no bruits. No lymphadenopathy or thryomegaly appreciated.  Cor: PMI laterally displaced. Regular rhythm and rate. No s3 or murmur.  Lungs: clear Abdomen: soft, nontender, mildy distended. No hepatosplenomegaly. No bruits or masses. Good bowel sounds. Extremities: no cyanosis, clubbing, rash. RLE LLE 2+ edema.   Neuro: alert & orientedx3, cranial nerves grossly intact. Moves all 4 extremities w/o difficulty. Affect pleasant.  Telemetry: SR  Labs: Basic Metabolic Panel:  Recent Labs Lab 08/13/14 1854 08/14/14 0337 08/15/14 0455  NA 129* 129* 128*  K 3.5 2.8* 3.6  CL 90* 90* 92*  CO2 26 26 27   GLUCOSE 153* 150* 134*  BUN 44* 42* 45*  CREATININE 1.37* 1.31 1.32  CALCIUM 8.9 8.7 8.5    Liver Function Tests: No results for input(s): AST, ALT, ALKPHOS, BILITOT, PROT, ALBUMIN in the last 168 hours. No results for input(s): LIPASE,  AMYLASE in the last 168 hours. No results for input(s): AMMONIA in the last 168 hours.  CBC:  Recent Labs Lab 08/13/14 1854  WBC 12.8*  HGB 12.4*  HCT 37.6*  MCV 81.6  PLT 359    Cardiac Enzymes: No results for input(s): CKTOTAL, CKMB, CKMBINDEX, TROPONINI in the last 168 hours.  BNP: BNP (last 3 results)  Recent Labs  08/13/14 1921  BNP 2249.6*    ProBNP (last 3 results)  Recent Labs  03/01/14 1959 03/08/14 2230 06/05/14 0332  PROBNP 11880.0* 15334.0* 19533.0*      Other results:    Imaging: Dg Chest 2 View  08/13/2014   CLINICAL DATA:  Chest pressure short of breath since last night.  EXAM: CHEST  2 VIEW  COMPARISON:  Chest radiograph 07/04/2014  FINDINGS: Left-sided pacemaker overlies normal cardiac silhouette. There is fine interstitial pattern which is improved from comparison exam. Small bilateral pleural effusions present. No pneumothorax. Right central venous line is noted.  IMPRESSION: 1. Improvement in interstitial edema pattern. 2. Small effusions.   Electronically Signed   By: Suzy Bouchard M.D.   On: 08/13/2014 19:39     Medications:     Scheduled Medications: . amiodarone  200 mg Oral Daily  . furosemide  120 mg Intravenous BID  . magnesium oxide  400 mg Oral BID  . metoCLOPramide  10 mg Oral BID WC  . metolazone  5 mg Oral Once  .  potassium chloride SA  20 mEq Oral BID  . senna  2 tablet Oral BID  . senna-docusate  2 tablet Oral BID  . sildenafil  20 mg Oral TID  . sodium chloride  3 mL Intravenous Q12H  . sorbitol  30 mL Oral Daily  . spironolactone  50 mg Oral Daily  . Warfarin - Pharmacist Dosing Inpatient   Does not apply q1800    Infusions: . milrinone 0.5 mcg/kg/min (08/14/14 2228)    PRN Medications: sodium chloride, acetaminophen, ALPRAZolam, ondansetron (ZOFRAN) IV, oxyCODONE, sodium chloride, sodium chloride, traMADol   Assessment:   1. Acute on chronic systolic HF (EF 53%) on home milrinone 2. Ab  distension/constipation 3. PAH 4. CKD, stage III-IV 5. PAF in NSR on amio 6. Retroperitoneal mass. 7. Hypokalemia 8. Hyponatremia 9. Hypocalcemia 10. DNR   Plan/Discussion:     Improving with IV lasix. Renal function and co-ox stable. Continue milrinone. IV diuresis for at least one more day. Add metolazone and TED hose. Ambulate. Continue to supp K+   Continue amio and coumadin for AF. In NSR. INR looks good.    Length of Stay: 2 Glori Bickers MD 08/15/2014, 8:28 AM  Advanced Heart Failure Team Pager [phone] (M-F; 7a - 4p)  Please contact [facility] for night-coverage after hours (4p -7a ) and weekends on [email or web address]

## 2014-08-16 DIAGNOSIS — I272 Other secondary pulmonary hypertension: Secondary | ICD-10-CM

## 2014-08-16 DIAGNOSIS — I480 Paroxysmal atrial fibrillation: Secondary | ICD-10-CM

## 2014-08-16 LAB — BASIC METABOLIC PANEL WITH GFR
Anion gap: 8 (ref 5–15)
BUN: 49 mg/dL — ABNORMAL HIGH (ref 6–23)
CO2: 28 mmol/L (ref 19–32)
Calcium: 8.8 mg/dL (ref 8.4–10.5)
Chloride: 91 mmol/L — ABNORMAL LOW (ref 96–112)
Creatinine, Ser: 1.44 mg/dL — ABNORMAL HIGH (ref 0.50–1.35)
GFR calc Af Amer: 52 mL/min — ABNORMAL LOW
GFR calc non Af Amer: 45 mL/min — ABNORMAL LOW
Glucose, Bld: 139 mg/dL — ABNORMAL HIGH (ref 70–99)
Potassium: 4.8 mmol/L (ref 3.5–5.1)
Sodium: 127 mmol/L — ABNORMAL LOW (ref 135–145)

## 2014-08-16 LAB — PROTIME-INR
INR: 2.54 — ABNORMAL HIGH (ref 0.00–1.49)
Prothrombin Time: 27.5 s — ABNORMAL HIGH (ref 11.6–15.2)

## 2014-08-16 MED ORDER — TORSEMIDE 20 MG PO TABS
80.0000 mg | ORAL_TABLET | Freq: Every day | ORAL | Status: DC
  Administered 2014-08-17: 80 mg via ORAL
  Filled 2014-08-16: qty 4

## 2014-08-16 MED ORDER — WARFARIN SODIUM 1 MG PO TABS
1.0000 mg | ORAL_TABLET | Freq: Once | ORAL | Status: AC
  Administered 2014-08-16: 1 mg via ORAL
  Filled 2014-08-16: qty 1

## 2014-08-16 NOTE — Progress Notes (Signed)
ANTICOAGULATION CONSULT NOTE - Follow Up Consult  Pharmacy Consult for Coumadin Indication: afib and LA thrombus  Allergies  Allergen Reactions  . Lisinopril Rash    Patient Measurements: Height: 5\' 8"  (172.7 cm) Weight: 142 lb (64.411 kg) (scale b) IBW/kg (Calculated) : 68.4  Vital Signs: Temp: 98.5 F (36.9 C) (02/27 0611) Temp Source: Oral (02/27 0611) BP: 92/54 mmHg (02/27 0611) Pulse Rate: 83 (02/27 0611)  Labs:  Recent Labs  08/13/14 1854  08/14/14 0337 08/14/14 1455 08/15/14 0455 08/16/14 0433  HGB 12.4*  --   --   --   --   --   HCT 37.6*  --   --   --   --   --   PLT 359  --   --   --   --   --   LABPROT  --   < >  --  22.4* 23.5* 27.5*  INR  --   < >  --  1.95* 2.07* 2.54*  CREATININE 1.37*  --  1.31  --  1.32 1.44*  < > = values in this interval not displayed.  Estimated Creatinine Clearance: 37.9 mL/min (by C-G formula based on Cr of 1.44).  Assessment: 81yom admitted 08/13/2014  coumadin pta for afib and LA thrombus, being admitted with HF exacerbation. INR on admission was below goal at 1.75, pharmacy consulted to dose warfarin. INR is now at goal at 2.54 but has trended up significantly from 2.07 yesterday (increased dose given on 2/25). No reported significant s/s bleeding.   Home dose: 4mg  MWFSun, 2mg  TTSat  Goal of Therapy:  INR 2-3 Monitor platelets by anticoagulation protocol: Yes   Plan:  1) Coumadin 1 mg PO x 1 tonight 2) Daily INR 3) Monitor CBC and for s/s bleeding  Thank you for allowing pharmacy to be a part of this patients care team.  Ruta Hinds. [name], PharmD, [place] Clinical Pharmacist - Resident Pager: [phone] Pharmacy: [phone] 08/16/2014 10:46 AM

## 2014-08-16 NOTE — Progress Notes (Signed)
Advanced Heart Failure Rounding Note   Subjective:    Felt dizzy this am. Weak.  Diuresing but weight recorded as up 4 pounds. (doubt accurate). Denies dyspnea.  Renal function relatively stable.     Objective:   Weight Range:  Vital Signs:   Temp:  [97.7 F (36.5 C)-98.5 F (36.9 C)] 98.5 F (36.9 C) (02/27 0611) Pulse Rate:  [72-85] 83 (02/27 0611) Resp:  [14-20] 18 (02/27 0611) BP: (90-101)/(49-54) 92/54 mmHg (02/27 0611) SpO2:  [96 %-100 %] 100 % (02/27 0611) Weight:  [64.411 kg (142 lb)] 64.411 kg (142 lb) (02/27 0611) Last BM Date: 08/14/14  Weight change: Filed Weights   08/14/14 0025 08/15/14 0749 08/16/14 0611  Weight: 63.186 kg (139 lb 4.8 oz) 62.778 kg (138 lb 6.4 oz) 64.411 kg (142 lb)    Intake/Output:   Intake/Output Summary (Last 24 hours) at 08/16/14 4917 Last data filed at 08/16/14 0600  Gross per 24 hour  Intake 1305.96 ml  Output   1575 ml  Net -269.04 ml     Physical Exam: General: Elderly, chronically ill appearing Sitting on side of bed. NAD.No resp difficulty;  HEENT: normal Neck: supple. JVP to jaw; Carotids 2+ bilaterally; no bruits. No lymphadenopathy or thryomegaly appreciated.  Cor: PMI laterally displaced. Regular rhythm and rate. No s3 or murmur.  Lungs: clear Abdomen: soft, nontender, mildy distended. No hepatosplenomegaly. No bruits or masses. Good bowel sounds. Extremities: no cyanosis, clubbing, rash. RLE LLE 1+ edema.   Neuro: alert & orientedx3, cranial nerves grossly intact. Moves all 4 extremities w/o difficulty. Affect pleasant.  Telemetry: SR  Labs: Basic Metabolic Panel:  Recent Labs Lab 08/13/14 1854 08/14/14 0337 08/15/14 0455 08/16/14 0433  NA 129* 129* 128* 127*  K 3.5 2.8* 3.6 4.8  CL 90* 90* 92* 91*  CO2 26 26 27 28   GLUCOSE 153* 150* 134* 139*  BUN 44* 42* 45* 49*  CREATININE 1.37* 1.31 1.32 1.44*  CALCIUM 8.9 8.7 8.5 8.8    Liver Function Tests: No results for input(s): AST, ALT, ALKPHOS,  BILITOT, PROT, ALBUMIN in the last 168 hours. No results for input(s): LIPASE, AMYLASE in the last 168 hours. No results for input(s): AMMONIA in the last 168 hours.  CBC:  Recent Labs Lab 08/13/14 1854  WBC 12.8*  HGB 12.4*  HCT 37.6*  MCV 81.6  PLT 359    Cardiac Enzymes: No results for input(s): CKTOTAL, CKMB, CKMBINDEX, TROPONINI in the last 168 hours.  BNP: BNP (last 3 results)  Recent Labs  08/13/14 1921  BNP 2249.6*    ProBNP (last 3 results)  Recent Labs  03/01/14 1959 03/08/14 2230 06/05/14 0332  PROBNP 11880.0* 15334.0* 19533.0*      Other results:    Imaging: No results found.   Medications:     Scheduled Medications: . amiodarone  200 mg Oral Daily  . furosemide  120 mg Intravenous BID  . magnesium oxide  400 mg Oral BID  . metoCLOPramide  10 mg Oral BID WC  . potassium chloride SA  40 mEq Oral TID  . senna  2 tablet Oral BID  . senna-docusate  2 tablet Oral BID  . sildenafil  20 mg Oral TID  . sodium chloride  3 mL Intravenous Q12H  . sorbitol  30 mL Oral Daily  . spironolactone  50 mg Oral Daily  . Warfarin - Pharmacist Dosing Inpatient   Does not apply q1800    Infusions: . milrinone 0.5 mcg/kg/min (08/15/14 2250)  PRN Medications: sodium chloride, acetaminophen, ALPRAZolam, ondansetron (ZOFRAN) IV, oxyCODONE, sodium chloride, sodium chloride, traMADol    Assessment:   1. Acute on chronic systolic HF (EF 19%) on home milrinone 2. Ab distension/constipation 3. PAH 4. CKD, stage III-IV 5. PAF in NSR on amio 6. Retroperitoneal mass. 7. Hypokalemia 8. Hyponatremia 9. Hypocalcemia 10. DNR   Plan/Discussion:     Dizzy this am. Renal function and co-ox stable. Continue milrinone. Stop IV lasix. Switch back to po torsemide.  Continue amio and coumadin for AF. In NSR. INR looks good.   Possibly home in am.    Length of Stay: 3 Glori Bickers MD 08/16/2014, 8:33 AM  Advanced Heart Failure Team Pager  [phone] (M-F; [place])  Please contact [facility] for night-coverage after hours (4p -7a ) and weekends on [email or web address]

## 2014-08-17 LAB — BASIC METABOLIC PANEL
ANION GAP: 10 (ref 5–15)
BUN: 55 mg/dL — ABNORMAL HIGH (ref 6–23)
CALCIUM: 8.6 mg/dL (ref 8.4–10.5)
CO2: 31 mmol/L (ref 19–32)
Chloride: 86 mmol/L — ABNORMAL LOW (ref 96–112)
Creatinine, Ser: 1.49 mg/dL — ABNORMAL HIGH (ref 0.50–1.35)
GFR calc Af Amer: 50 mL/min — ABNORMAL LOW (ref >90)
GFR calc non Af Amer: 43 mL/min — ABNORMAL LOW (ref >90)
Glucose, Bld: 135 mg/dL — ABNORMAL HIGH (ref 70–99)
Potassium: 4.3 mmol/L (ref 3.5–5.1)
Sodium: 127 mmol/L — ABNORMAL LOW (ref 135–145)

## 2014-08-17 LAB — PROTIME-INR
INR: 2.54 — ABNORMAL HIGH (ref 0.00–1.49)
PROTHROMBIN TIME: 27.5 s — AB (ref 11.6–15.2)

## 2014-08-17 MED ORDER — SODIUM CHLORIDE 0.9 % IV SOLN: 250.0000 mL | INTRAVENOUS | Status: DC | PRN

## 2014-08-17 NOTE — Progress Notes (Signed)
ANTICOAGULATION CONSULT NOTE - Follow Up Consult  Pharmacy Consult for Coumadin Indication: afib and LA thrombus  Allergies  Allergen Reactions  . Lisinopril Rash    Patient Measurements: Height: 5\' 8"  (172.7 cm) Weight: 140 lb 9.6 oz (63.776 kg) (Scale B) IBW/kg (Calculated) : 68.4  Vital Signs: Temp: 98.3 F (36.8 C) (02/28 0530) Temp Source: Oral (02/28 0530) BP: 82/51 mmHg (02/28 0530) Pulse Rate: 83 (02/28 0530)  Labs:  Recent Labs  08/15/14 0455 08/16/14 0433 08/17/14 0420  LABPROT 23.5* 27.5* 27.5*  INR 2.07* 2.54* 2.54*  CREATININE 1.32 1.44* 1.49*    Estimated Creatinine Clearance: 36.3 mL/min (by C-G formula based on Cr of 1.49).  Assessment: 81yom admitted 08/13/2014  coumadin pta for afib and LA thrombus, being admitted with HF exacerbation. INR on admission was below goal at 1.75, pharmacy consulted to dose warfarin. INR remains at goal at 2.54 (increased dose given on 2/25, lower dose given on 2/27 after steep trend up). No reported significant s/s bleeding.   Home dose: 4mg  MWFSun, 2mg  TTSat  Goal of Therapy:  INR 2-3 Monitor platelets by anticoagulation protocol: Yes   Plan:  - Patient is being discharged home today so would recommend resumption of home regimen - Recommend outpatient INR follow up within a week of discharge  Thank you for allowing pharmacy to be a part of this patients care team.  Ruta Hinds. [name], PharmD, [place] Clinical Pharmacist - Resident Pager: [phone] Pharmacy: [phone] 08/17/2014 12:07 PM

## 2014-08-17 NOTE — Discharge Summary (Signed)
CARDIOLOGY DISCHARGE SUMMARY   Patient ID: Tyler Christensen MRN: [id number] DOB/AGE: 07-11-1932 81 y.o.  Admit date: 08/13/2014 Discharge date: 08/17/2014  PCP: Harvie Junior, MD Primary Cardiologist: Dr. Haroldine Laws  Primary Discharge Diagnosis:  Acute on chronic systolic and diastolic CHF, Class 3 - weight at discharge 140 pounds  Secondary Discharge Diagnosis:  Active Problems:   PAH (pulmonary artery hypertension)   Long-term (current) use of anticoagulants   CKD (chronic kidney disease) stage 3, GFR 30-59 ml/min   Hyponatremia   Hypokalemia  Procedures: CXR  Hospital Course: Tyler Christensen is a 81 y.o. male with a history of CHF, EF 15% January 2016, pulmonary hypertension and PAF on warfarin. He is on home milrinone. He noted increasing shortness of breath and orthopnea as well as some edema. He came to the emergency room and was admitted for further evaluation and treatment.  His BNP was elevated over 2000. Cardiac enzymes were checked and were negative. He was diuresed with IV Lasix and his respiratory status improved. At discharge, his weight is 140 pounds.  His renal function was followed closely during his hospital stay, discharge labs are below. His BUN and creatinine increased slightly with diuresis but are consistent with previous values.  He was noted to have hyponatremia, which has been present since December 2015. He became hypokalemic with diuresis, and his potassium was supplemented.  He developed dizziness, felt secondary to medications and his Lasix was held. On 02/28, he was seen by Dr. Haroldine Laws and all data were reviewed. He is back on oral torsemide and feels that he is breathing better. His weight is at baseline and he is no longer constipated. No further inpatient workup is indicated and he is considered stable for discharge, to have an early follow-up in the heart failure clinic next week.  Labs:   Lab Results  Component Value Date   WBC 12.8*  08/13/2014   HGB 12.4* 08/13/2014   HCT 37.6* 08/13/2014   MCV 81.6 08/13/2014   PLT 359 08/13/2014     Recent Labs Lab 08/17/14 0420  NA 127*  K 4.3  CL 86*  CO2 31  BUN 55*  CREATININE 1.49*  CALCIUM 8.6  GLUCOSE 135*   B NATRIURETIC PEPTIDE  Date/Time Value Ref Range Status  08/13/2014 07:21 PM 2249.6* 0.0 - 100.0 pg/mL Final    Recent Labs  08/17/14 0420  INR 2.54*      Radiology: Dg Chest 2 View  08/13/2014   CLINICAL DATA:  Chest pressure short of breath since last night.  EXAM: CHEST  2 VIEW  COMPARISON:  Chest radiograph 07/04/2014  FINDINGS: Left-sided pacemaker overlies normal cardiac silhouette. There is fine interstitial pattern which is improved from comparison exam. Small bilateral pleural effusions present. No pneumothorax. Right central venous line is noted.  IMPRESSION: 1. Improvement in interstitial edema pattern. 2. Small effusions.   Electronically Signed   By: Suzy Bouchard M.D.   On: 08/13/2014 19:39   EKG: 08/13/2014 AV paced  FOLLOW UP PLANS AND APPOINTMENTS Allergies  Allergen Reactions  . Lisinopril Rash     Medication List    TAKE these medications        acetaminophen 325 MG tablet  Commonly known as:  TYLENOL  Take 2 tablets (650 mg total) by mouth every 6 (six) hours as needed for mild pain (or Fever >/= 101).     amiodarone 200 MG tablet  Commonly known as:  PACERONE  Take 1 tablet (200 mg total)  by mouth daily.     magnesium oxide 400 MG tablet  Commonly known as:  MAG-OX  Take 1 tablet (400 mg total) by mouth 2 (two) times daily.     metoCLOPramide 10 MG tablet  Commonly known as:  REGLAN  Take 1 tablet (10 mg total) by mouth 2 (two) times daily.     metolazone 2.5 MG tablet  Commonly known as:  ZAROXOLYN  Take 2.5 mg by mouth 2 (two) times a week. Take Sunday and Thursday     milrinone 20 MG/100ML Soln infusion  Commonly known as:  PRIMACOR  Inject 33.95 mcg/min into the vein continuous.     Oxycodone HCl 10 MG  Tabs  Take 10 mg by mouth every 4 (four) hours as needed (pain).     potassium chloride SA 20 MEQ tablet  Commonly known as:  K-DUR,KLOR-CON  Take 1 tablet (20 mEq total) by mouth 2 (two) times daily.     senna 8.6 MG Tabs tablet  Commonly known as:  SENOKOT  Take 2 tablets by mouth 2 (two) times daily.     senna-docusate 8.6-50 MG per tablet  Commonly known as:  Senokot-S  Take 2 tablets by mouth 2 (two) times daily.     sildenafil 20 MG tablet  Commonly known as:  REVATIO  Take 1 tablet (20 mg total) by mouth 3 (three) times daily.     sorbitol 70 % Soln  Take 30 mLs by mouth daily.     spironolactone 25 MG tablet  Commonly known as:  ALDACTONE  Take 2 tablets (50 mg total) by mouth daily.     torsemide 20 MG tablet  Commonly known as:  DEMADEX  Take 4 tablets (80 mg total) by mouth daily.     traMADol 50 MG tablet  Commonly known as:  ULTRAM  Take 1 tablet (50 mg total) by mouth 2 (two) times daily as needed. for pain     warfarin 4 MG tablet  Commonly known as:  COUMADIN  Take 4 mg Monday, Wednesday, Friday and Sunday. 2 mg on Tues, Thurs and Sat        Discharge Instructions    (HEART FAILURE PATIENTS) Call MD:  Anytime you have any of the following symptoms: 1) 3 pound weight gain in 24 hours or 5 pounds in 1 week 2) shortness of breath, with or without a dry hacking cough 3) swelling in the hands, feet or stomach 4) if you have to sleep on extra pillows at night in order to breathe.    Complete by:  As directed      Diet - low sodium heart healthy    Complete by:  As directed      Increase activity slowly    Complete by:  As directed           Follow-up Information    Follow up with [facility].   Why:  The office will call.    Contact information:   [address] [place] [address]       BRING ALL MEDICATIONS WITH YOU TO FOLLOW UP APPOINTMENTS  Time spent with patient to include physician time: 34  min Signed: Rosaria Ferries, PA-C 08/17/2014, 10:12 AM Co-Sign MD  Patient seen and examined with Rosaria Ferries, PA-C. We discussed all aspects of the encounter. I agree with the assessment and plan as stated above. He is improved with diuresis. Ready for d/c. See my rounding  for more details.  Benay Spice 5:21 PM

## 2014-08-17 NOTE — Progress Notes (Signed)
Advanced Heart Failure Rounding Note   Subjective:    Lasix held yesterday due to dizziness. Now back on oral torsemide. Weight stable at 140. Renal function stable.   Feels good. Wants to go home   Objective:   Weight Range:  Vital Signs:   Temp:  [97.5 F (36.4 C)-98.3 F (36.8 C)] 98.3 F (36.8 C) (02/28 0530) Pulse Rate:  [80-85] 83 (02/28 0530) Resp:  [18] 18 (02/28 0530) BP: (82-96)/(48-64) 82/51 mmHg (02/28 0530) SpO2:  [90 %-95 %] 95 % (02/28 0530) Weight:  [63.776 kg (140 lb 9.6 oz)] 63.776 kg (140 lb 9.6 oz) (02/28 0530) Last BM Date: 08/14/14  Weight change: Filed Weights   08/15/14 0749 08/16/14 0611 08/17/14 0530  Weight: 62.778 kg (138 lb 6.4 oz) 64.411 kg (142 lb) 63.776 kg (140 lb 9.6 oz)    Intake/Output:   Intake/Output Summary (Last 24 hours) at 08/17/14 2694 Last data filed at 08/17/14 8546  Gross per 24 hour  Intake  949.4 ml  Output   1925 ml  Net -975.6 ml     Physical Exam: General: Elderly, chronically ill appearing Sitting on side of bed. NAD.No resp difficulty;  HEENT: normal Neck: supple. JVP to jaw; Carotids 2+ bilaterally; no bruits. No lymphadenopathy or thryomegaly appreciated.  Cor: PMI laterally displaced. Regular rhythm and rate. No s3 or murmur.  Lungs: clear Abdomen: soft, nontender, mildy distended. No hepatosplenomegaly. No bruits or masses. Good bowel sounds. Extremities: no cyanosis, clubbing, rash. RLE LLE 1+ edema.   Neuro: alert & orientedx3, cranial nerves grossly intact. Moves all 4 extremities w/o difficulty. Affect pleasant.  Telemetry: SR  Labs: Basic Metabolic Panel:  Recent Labs Lab 08/13/14 1854 08/14/14 0337 08/15/14 0455 08/16/14 0433 08/17/14 0420  NA 129* 129* 128* 127* 127*  K 3.5 2.8* 3.6 4.8 4.3  CL 90* 90* 92* 91* 86*  CO2 26 26 27 28 31   GLUCOSE 153* 150* 134* 139* 135*  BUN 44* 42* 45* 49* 55*  CREATININE 1.37* 1.31 1.32 1.44* 1.49*  CALCIUM 8.9 8.7 8.5 8.8 8.6    Liver Function  Tests: No results for input(s): AST, ALT, ALKPHOS, BILITOT, PROT, ALBUMIN in the last 168 hours. No results for input(s): LIPASE, AMYLASE in the last 168 hours. No results for input(s): AMMONIA in the last 168 hours.  CBC:  Recent Labs Lab 08/13/14 1854  WBC 12.8*  HGB 12.4*  HCT 37.6*  MCV 81.6  PLT 359    Cardiac Enzymes: No results for input(s): CKTOTAL, CKMB, CKMBINDEX, TROPONINI in the last 168 hours.  BNP: BNP (last 3 results)  Recent Labs  08/13/14 1921  BNP 2249.6*    ProBNP (last 3 results)  Recent Labs  03/01/14 1959 03/08/14 2230 06/05/14 0332  PROBNP 11880.0* 15334.0* 19533.0*      Other results:    Imaging: No results found.   Medications:     Scheduled Medications: . amiodarone  200 mg Oral Daily  . magnesium oxide  400 mg Oral BID  . metoCLOPramide  10 mg Oral BID WC  . senna  2 tablet Oral BID  . senna-docusate  2 tablet Oral BID  . sildenafil  20 mg Oral TID  . sodium chloride  3 mL Intravenous Q12H  . sorbitol  30 mL Oral Daily  . spironolactone  50 mg Oral Daily  . torsemide  80 mg Oral Daily  . Warfarin - Pharmacist Dosing Inpatient   Does not apply q1800    Infusions: . milrinone  0.5 mcg/kg/min (08/17/14 0551)    PRN Medications: sodium chloride, acetaminophen, ALPRAZolam, ondansetron (ZOFRAN) IV, oxyCODONE, sodium chloride, sodium chloride, traMADol    Assessment:   1. Acute on chronic systolic HF (EF 79%) on home milrinone 2. Ab distension/constipation 3. PAH 4. CKD, stage III-IV 5. PAF in NSR on amio 6. Retroperitoneal mass. 7. Hypokalemia 8. Hyponatremia 9. Hypocalcemia 10. DNR   Plan/Discussion:     Much better this am. Renal function stable. Weight at baseline. No longer constipated.   Can go home today on previous home regimen. Will see in HF Clinic next week.    Length of Stay: 4 Glori Bickers MD 08/17/2014, 9:22 AM  Advanced Heart Failure Team Pager [phone] (M-F; 7a - 4p)  Please  contact [facility] for night-coverage after hours (4p -7a ) and weekends on [email or web address]

## 2014-08-17 NOTE — Progress Notes (Addendum)
Discharge instructions given to the patient and family.Interpreter services used. Patient and family verbalized understanding.[facility] RN started the home milrinone dose and patient discharged per order

## 2014-08-17 NOTE — Plan of Care (Signed)
Problem: Discharge Progression Outcomes Goal: If EF < 40% ACEI/ARB addressed at discharge Outcome: Not Applicable Date Met:  05/63/72 See allergy

## 2014-08-19 ENCOUNTER — Ambulatory Visit (HOSPITAL_COMMUNITY): Admission: RE | Admit: 2014-08-19 | Payer: Medicare Other | Source: Ambulatory Visit

## 2014-08-19 ENCOUNTER — Emergency Department (HOSPITAL_COMMUNITY): Payer: Medicare Other

## 2014-08-19 ENCOUNTER — Encounter (HOSPITAL_COMMUNITY): Payer: Self-pay | Admitting: Emergency Medicine

## 2014-08-19 ENCOUNTER — Inpatient Hospital Stay (HOSPITAL_COMMUNITY)
Admission: EM | Admit: 2014-08-19 | Discharge: 2014-08-23 | DRG: 292 | Disposition: A | Discharge status: Home / Self Care | Payer: Medicare Other | Attending: Internal Medicine | Admitting: Internal Medicine

## 2014-08-19 ENCOUNTER — Ambulatory Visit (INDEPENDENT_AMBULATORY_CARE_PROVIDER_SITE_OTHER): Payer: Medicare Other | Admitting: Cardiovascular Disease

## 2014-08-19 DIAGNOSIS — E780 Pure hypercholesterolemia: Secondary | ICD-10-CM | POA: Diagnosis present

## 2014-08-19 DIAGNOSIS — I428 Other cardiomyopathies: Secondary | ICD-10-CM

## 2014-08-19 DIAGNOSIS — Z95810 Presence of automatic (implantable) cardiac defibrillator: Secondary | ICD-10-CM | POA: Diagnosis present

## 2014-08-19 DIAGNOSIS — N183 Chronic kidney disease, stage 3 unspecified: Secondary | ICD-10-CM | POA: Diagnosis present

## 2014-08-19 DIAGNOSIS — I452 Bifascicular block: Secondary | ICD-10-CM | POA: Diagnosis present

## 2014-08-19 DIAGNOSIS — Z888 Allergy status to other drugs, medicaments and biological substances status: Secondary | ICD-10-CM

## 2014-08-19 DIAGNOSIS — Z8673 Personal history of transient ischemic attack (TIA), and cerebral infarction without residual deficits: Secondary | ICD-10-CM

## 2014-08-19 DIAGNOSIS — Z7189 Other specified counseling: Secondary | ICD-10-CM

## 2014-08-19 DIAGNOSIS — E876 Hypokalemia: Secondary | ICD-10-CM | POA: Diagnosis present

## 2014-08-19 DIAGNOSIS — I129 Hypertensive chronic kidney disease with stage 1 through stage 4 chronic kidney disease, or unspecified chronic kidney disease: Secondary | ICD-10-CM | POA: Diagnosis present

## 2014-08-19 DIAGNOSIS — I272 Other secondary pulmonary hypertension: Secondary | ICD-10-CM | POA: Diagnosis present

## 2014-08-19 DIAGNOSIS — I447 Left bundle-branch block, unspecified: Secondary | ICD-10-CM | POA: Diagnosis present

## 2014-08-19 DIAGNOSIS — I2721 Secondary pulmonary arterial hypertension: Secondary | ICD-10-CM | POA: Diagnosis present

## 2014-08-19 DIAGNOSIS — R0602 Shortness of breath: Secondary | ICD-10-CM

## 2014-08-19 DIAGNOSIS — E871 Hypo-osmolality and hyponatremia: Secondary | ICD-10-CM | POA: Diagnosis present

## 2014-08-19 DIAGNOSIS — I5023 Acute on chronic systolic (congestive) heart failure: Secondary | ICD-10-CM | POA: Diagnosis present

## 2014-08-19 DIAGNOSIS — K5900 Constipation, unspecified: Secondary | ICD-10-CM | POA: Diagnosis present

## 2014-08-19 DIAGNOSIS — Z7901 Long term (current) use of anticoagulants: Secondary | ICD-10-CM

## 2014-08-19 DIAGNOSIS — E785 Hyperlipidemia, unspecified: Secondary | ICD-10-CM | POA: Diagnosis present

## 2014-08-19 DIAGNOSIS — N184 Chronic kidney disease, stage 4 (severe): Secondary | ICD-10-CM | POA: Diagnosis present

## 2014-08-19 DIAGNOSIS — I480 Paroxysmal atrial fibrillation: Secondary | ICD-10-CM | POA: Diagnosis present

## 2014-08-19 DIAGNOSIS — I252 Old myocardial infarction: Secondary | ICD-10-CM

## 2014-08-19 DIAGNOSIS — I5043 Acute on chronic combined systolic (congestive) and diastolic (congestive) heart failure: Principal | ICD-10-CM | POA: Diagnosis present

## 2014-08-19 DIAGNOSIS — I4891 Unspecified atrial fibrillation: Secondary | ICD-10-CM

## 2014-08-19 DIAGNOSIS — I255 Ischemic cardiomyopathy: Secondary | ICD-10-CM | POA: Diagnosis present

## 2014-08-19 DIAGNOSIS — G40909 Epilepsy, unspecified, not intractable, without status epilepticus: Secondary | ICD-10-CM | POA: Diagnosis present

## 2014-08-19 DIAGNOSIS — Z66 Do not resuscitate: Secondary | ICD-10-CM | POA: Diagnosis present

## 2014-08-19 DIAGNOSIS — R1900 Intra-abdominal and pelvic swelling, mass and lump, unspecified site: Secondary | ICD-10-CM | POA: Diagnosis present

## 2014-08-19 LAB — CBC WITH DIFFERENTIAL/PLATELET
BASOS PCT: 0 % (ref 0–1)
Basophils Absolute: 0 10*3/uL (ref 0.0–0.1)
Eosinophils Absolute: 0.2 10*3/uL (ref 0.0–0.7)
Eosinophils Relative: 1 % (ref 0–5)
HCT: 32.1 % — ABNORMAL LOW (ref 39.0–52.0)
Hemoglobin: 10.8 g/dL — ABNORMAL LOW (ref 13.0–17.0)
Lymphocytes Relative: 5 % — ABNORMAL LOW (ref 12–46)
Lymphs Abs: 0.8 10*3/uL (ref 0.7–4.0)
MCH: 26.5 pg (ref 26.0–34.0)
MCHC: 33.6 g/dL (ref 30.0–36.0)
MCV: 78.9 fL (ref 78.0–100.0)
Monocytes Absolute: 1.2 10*3/uL — ABNORMAL HIGH (ref 0.1–1.0)
Monocytes Relative: 8 % (ref 3–12)
NEUTROS ABS: 12.7 10*3/uL — AB (ref 1.7–7.7)
NEUTROS PCT: 86 % — AB (ref 43–77)
Platelets: 396 10*3/uL (ref 150–400)
RBC: 4.07 MIL/uL — ABNORMAL LOW (ref 4.22–5.81)
RDW: 18.4 % — ABNORMAL HIGH (ref 11.5–15.5)
WBC: 14.9 10*3/uL — ABNORMAL HIGH (ref 4.0–10.5)

## 2014-08-19 LAB — I-STAT CHEM 8, ED
BUN: 45 mg/dL — ABNORMAL HIGH (ref 6–23)
CHLORIDE: 84 mmol/L — AB (ref 96–112)
Calcium, Ion: 1.04 mmol/L — ABNORMAL LOW (ref 1.13–1.30)
Creatinine, Ser: 1.7 mg/dL — ABNORMAL HIGH (ref 0.50–1.35)
Glucose, Bld: 111 mg/dL — ABNORMAL HIGH (ref 70–99)
HEMATOCRIT: 39 % (ref 39.0–52.0)
HEMOGLOBIN: 13.3 g/dL (ref 13.0–17.0)
Potassium: 3.2 mmol/L — ABNORMAL LOW (ref 3.5–5.1)
Sodium: 126 mmol/L — ABNORMAL LOW (ref 135–145)
TCO2: 25 mmol/L (ref 0–100)

## 2014-08-19 LAB — I-STAT TROPONIN, ED: TROPONIN I, POC: 0.02 ng/mL (ref 0.00–0.08)

## 2014-08-19 LAB — BRAIN NATRIURETIC PEPTIDE: B NATRIURETIC PEPTIDE 5: 1755.9 pg/mL — AB (ref 0.0–100.0)

## 2014-08-19 LAB — POCT INR: INR: 2.5

## 2014-08-19 NOTE — ED Provider Notes (Signed)
CSN: [id number]     Arrival date & time 08/19/14  2147 History  This chart was scribed for Kalman Drape, MD by Rayfield Citizen, ED Scribe. This patient was seen in room B14C/B14C and the patient's care was started at 11:16 PM.    Chief Complaint  Patient presents with  . Shortness of Breath   Patient is a 81 y.o. male presenting with shortness of breath. The history is provided by the patient. A language interpreter was used.  Shortness of Breath Associated symptoms: abdominal pain and chest pain   Associated symptoms: no cough, no fever and no vomiting      HPI Comments: Tyler Christensen is a 81 y.o. male with past medical history of HTN, HLD, CHF, BBBB, LBBB, MI, a-fib, atrial thrombus, stroke who presents to the Emergency Department complaining of moderate, "dull, tight" substernal chest pain, SOB, and confusion beginning this morning. Patient explains that he was seen here last week and discharged two days PTA for the same symptoms; he is back in the ED tonight because they have returned. He acknowledges that he has gained 5lbs (140 to 145lbs) since discharge two days PTA. He reports mild abdominal discomfort. He denies fevers, cough, nausea, vomiting, recent swelling in his lower extremities.   Patient reports that he is compliant with his Coumadin. He lives at home with his children.  Past Medical History  Diagnosis Date  . HTN (hypertension)   . High cholesterol   . CHF (congestive heart failure)   . BBBB (bilateral bundle branch block)   . LBBB (left bundle branch block)   . Seizures 06/11/2012    new onset/notes (06/11/2012)  . SOB (shortness of breath)     "sometimes when I lay down;; related to not taking my RX" (06/11/2012)  . Myocardial infarction     06/10/2012  . Atrial fibrillation   . Stroke   . Atrial thrombus     left  . Pulmonary HTN   . Atrial fibrillation 06/12/2012   Past Surgical History  Procedure Laterality Date  . Throat surgery  1942    "and nose"  (06/11/2012); ?T&A  . Inguinal hernia repair  ~ 2008    "both sides" (06/11/2012)  . Tee without cardioversion  06/29/2012    Procedure: TRANSESOPHAGEAL ECHOCARDIOGRAM (TEE);  Surgeon: Jolaine Artist, MD;  Location: [facility] ENDOSCOPY;  Service: Cardiovascular;  Laterality: N/A;  will need a spanish interpreter Interpreter will be here at 1330-Hope spoke w/ [place]  . Cardioversion N/A 08/14/2012    Procedure: CARDIOVERSION;  Surgeon: Jolaine Artist, MD;  Location: [facility] ENDOSCOPY;  Service: Cardiovascular;  Laterality: N/A;  . Tee without cardioversion N/A 11/12/2013    Procedure: TRANSESOPHAGEAL ECHOCARDIOGRAM (TEE);  Surgeon: Candee Furbish, MD;  Location: [facility] ENDOSCOPY;  Service: Cardiovascular;  Laterality: N/A;  . Tee without cardioversion N/A 11/19/2013    Procedure: TRANSESOPHAGEAL ECHOCARDIOGRAM (TEE);  Surgeon: Jolaine Artist, MD;  Location[facility] ENDOSCOPY;  Service: Cardiovascular;  Laterality: N/A;  . Cardioversion N/A 12/27/2013    Procedure: CARDIOVERSION;  Surgeon: Jolaine Artist, MD;  Location: [facility] ENDOSCOPY;  Service: Cardiovascular;  Laterality: N/A;  . Left and right heart catheterization with coronary angiogram N/A 07/02/2012    Procedure: LEFT AND RIGHT HEART CATHETERIZATION WITH CORONARY ANGIOGRAM;  Surgeon: Jolaine Artist, MD;  Location: [facility] CATH LAB;  Service: Cardiovascular;  Laterality: N/A;  . Right heart catheterization N/A 07/06/2012    Procedure: RIGHT HEART CATH;  Surgeon: Jolaine Artist, MD;  Location: [facility]  CATH LAB;  Service: Cardiovascular;  Laterality: N/A;  . Bi-ventricular implantable cardioverter defibrillator N/A 10/26/2012    Procedure: BI-VENTRICULAR IMPLANTABLE CARDIOVERTER DEFIBRILLATOR  (CRT-D);  Surgeon: Evans Lance, MD;  Location: [facility] CATH LAB;  Service: Cardiovascular;  Laterality: N/A;   Family History  Problem Relation Age of Onset  . Heart attack      father had MI in his 12s, no fhx of early heart problem before age 79  . Heart attack       mother died of MI around 27-Sep-1998   History  Substance Use Topics  . Smoking status: Never Smoker   . Smokeless tobacco: Never Used  . Alcohol Use: No    Review of Systems  Constitutional: Positive for unexpected weight change. Negative for fever.  Respiratory: Positive for shortness of breath. Negative for cough.   Cardiovascular: Positive for chest pain. Negative for leg swelling.  Gastrointestinal: Positive for abdominal pain. Negative for nausea and vomiting.  Psychiatric/Behavioral: Positive for confusion.  All other systems reviewed and are negative.   Allergies  Lisinopril  Home Medications   Prior to Admission medications   Medication Sig Start Date End Date Taking? Authorizing Provider  acetaminophen (TYLENOL) 325 MG tablet Take 2 tablets (650 mg total) by mouth every 6 (six) hours as needed for mild pain (or Fever >/= 101). 07/10/14   Kinnie Feil, MD  amiodarone (PACERONE) 200 MG tablet Take 1 tablet (200 mg total) by mouth daily. 07/10/14   Kinnie Feil, MD  magnesium oxide (MAG-OX) 400 MG tablet Take 1 tablet (400 mg total) by mouth 2 (two) times daily. 04/25/14   Jolaine Artist, MD  metoCLOPramide (REGLAN) 10 MG tablet Take 1 tablet (10 mg total) by mouth 2 (two) times daily. 07/31/14   Jolaine Artist, MD  metolazone (ZAROXOLYN) 2.5 MG tablet Take 2.5 mg by mouth 2 (two) times a week. Take 09-27-2022 and Thursday    Historical Provider, MD  milrinone (PRIMACOR) 20 MG/100ML SOLN infusion Inject 33.95 mcg/min into the vein continuous. 06/10/14   Rande Brunt, NP  Oxycodone HCl 10 MG TABS Take 10 mg by mouth every 4 (four) hours as needed (pain).    Historical Provider, MD  potassium chloride SA (K-DUR,KLOR-CON) 20 MEQ tablet Take 1 tablet (20 mEq total) by mouth 2 (two) times daily. 07/31/14   Jolaine Artist, MD  senna (SENOKOT) 8.6 MG TABS tablet Take 2 tablets by mouth 2 (two) times daily. 07/10/14   Historical Provider, MD  senna-docusate (SENOKOT-S) 8.6-50 MG  per tablet Take 2 tablets by mouth 2 (two) times daily. 07/17/14   Jolaine Artist, MD  sildenafil (REVATIO) 20 MG tablet Take 1 tablet (20 mg total) by mouth 3 (three) times daily. 03/07/14   Rande Brunt, NP  sorbitol 70 % SOLN Take 30 mLs by mouth daily. 07/17/14   Jolaine Artist, MD  spironolactone (ALDACTONE) 25 MG tablet Take 2 tablets (50 mg total) by mouth daily. 07/22/14   Jolaine Artist, MD  torsemide (DEMADEX) 20 MG tablet Take 4 tablets (80 mg total) by mouth daily. 04/25/14   Jolaine Artist, MD  traMADol (ULTRAM) 50 MG tablet Take 1 tablet (50 mg total) by mouth 2 (two) times daily as needed. for pain 07/17/14   Jolaine Artist, MD  warfarin (COUMADIN) 4 MG tablet Take 4 mg Monday, Wednesday, Friday and Sep 27, 2022. 2 mg on Cain Saupe and Sat 06/10/14   Rande Brunt, NP  BP 99/54 mmHg  Pulse 80  Temp(Src) 98.1 F (36.7 C) (Oral)  Resp 18  Ht 5\' 8"  (1.727 m)  Wt 145 lb (65.772 kg)  BMI 22.05 kg/m2  SpO2 100% Physical Exam  Constitutional: He is oriented to person, place, and time. He appears well-developed and well-nourished. No distress.  HENT:  Head: Normocephalic and atraumatic.  Mouth/Throat: Oropharynx is clear and moist. No oropharyngeal exudate.  Eyes: EOM are normal. Pupils are equal, round, and reactive to light.  Neck: Normal range of motion. Neck supple.  Cardiovascular: Normal rate, regular rhythm and normal heart sounds.  Exam reveals no gallop and no friction rub.   No murmur heard. Pulmonary/Chest: Effort normal. No respiratory distress. He has no wheezes. He has rales (Mild rales in bases).  Abdominal: Soft. There is no tenderness. There is no rebound and no guarding.  Musculoskeletal: Normal range of motion. He exhibits no edema.  Neurological: He is alert and oriented to person, place, and time.  Skin: Skin is warm and dry. No rash noted.  Psychiatric: He has a normal mood and affect. His behavior is normal.  Nursing note and vitals  reviewed.   ED Course  Procedures   DIAGNOSTIC STUDIES: Oxygen Saturation is 95% on [place] 4L/min, adequate by my interpretation.    COORDINATION OF CARE: 11:29 PM Discussed treatment plan with pt at bedside and pt agreed to plan.   Labs Review Labs Reviewed  CBC WITH DIFFERENTIAL/PLATELET - Abnormal; Notable for the following:    WBC 14.9 (*)    RBC 4.07 (*)    Hemoglobin 10.8 (*)    HCT 32.1 (*)    RDW 18.4 (*)    Neutrophils Relative % 86 (*)    Neutro Abs 12.7 (*)    Lymphocytes Relative 5 (*)    Monocytes Absolute 1.2 (*)    All other components within normal limits  BRAIN NATRIURETIC PEPTIDE - Abnormal; Notable for the following:    B Natriuretic Peptide 1755.9 (*)    All other components within normal limits  I-STAT CHEM 8, ED - Abnormal; Notable for the following:    Sodium 126 (*)    Potassium 3.2 (*)    Chloride 84 (*)    BUN 45 (*)    Creatinine, Ser 1.70 (*)    Glucose, Bld 111 (*)    Calcium, Ion 1.04 (*)    All other components within normal limits  BASIC METABOLIC PANEL  I-STAT TROPOININ, ED    Imaging Review No results found.   EKG Interpretation   Date/Time:  Tuesday August 19 2014 21:54:32 EST Ventricular Rate:  83 PR Interval:  152 QRS Duration: 186 QT Interval:  492 QTC Calculation: 578 R Axis:   -93 Text Interpretation:  Electronic ventricular pacemaker No significant  change since last tracing Confirmed by Verneal Wiers  MD, Gisselle Galvis ([id number]) on 08/19/2014  11:44:55 PM      MDM   Final diagnoses:  SOB (shortness of breath)   81 year old male with history of congestive heart failure, EF of 15% who presents with chest pain and shortness of breath.  Patient was discharged for same 2 days ago.  Patient reports that since this morning.  He has been more confused.  Patient speaks only Spanish, interpreter phone used.  Even with the interpreter, patient often does not answer questions correctly.  Plan for chest x-ray, labs, EKG, and we'll discuss with  cardiology   I personally performed the services described in this documentation, which was scribed  in my presence. The recorded information has been reviewed and is accurate.      Kalman Drape, MD 08/20/14 [phone]

## 2014-08-19 NOTE — ED Notes (Addendum)
Pt to ED  With c/o shortness of breath since this am also c/o right chest pain.  Pt has a port where he is currently receiving IV meds  For unknown reason. Pt is on home 02 at [place] phone used in triage

## 2014-08-20 DIAGNOSIS — I252 Old myocardial infarction: Secondary | ICD-10-CM | POA: Diagnosis not present

## 2014-08-20 DIAGNOSIS — I452 Bifascicular block: Secondary | ICD-10-CM | POA: Diagnosis present

## 2014-08-20 DIAGNOSIS — I272 Other secondary pulmonary hypertension: Secondary | ICD-10-CM | POA: Diagnosis present

## 2014-08-20 DIAGNOSIS — I519 Heart disease, unspecified: Secondary | ICD-10-CM | POA: Diagnosis not present

## 2014-08-20 DIAGNOSIS — G40909 Epilepsy, unspecified, not intractable, without status epilepticus: Secondary | ICD-10-CM | POA: Diagnosis present

## 2014-08-20 DIAGNOSIS — E871 Hypo-osmolality and hyponatremia: Secondary | ICD-10-CM | POA: Diagnosis present

## 2014-08-20 DIAGNOSIS — R0602 Shortness of breath: Secondary | ICD-10-CM | POA: Diagnosis present

## 2014-08-20 DIAGNOSIS — K5900 Constipation, unspecified: Secondary | ICD-10-CM | POA: Diagnosis present

## 2014-08-20 DIAGNOSIS — I5043 Acute on chronic combined systolic (congestive) and diastolic (congestive) heart failure: Secondary | ICD-10-CM | POA: Diagnosis present

## 2014-08-20 DIAGNOSIS — I480 Paroxysmal atrial fibrillation: Secondary | ICD-10-CM | POA: Diagnosis present

## 2014-08-20 DIAGNOSIS — I5023 Acute on chronic systolic (congestive) heart failure: Secondary | ICD-10-CM | POA: Diagnosis not present

## 2014-08-20 DIAGNOSIS — Z8673 Personal history of transient ischemic attack (TIA), and cerebral infarction without residual deficits: Secondary | ICD-10-CM | POA: Diagnosis not present

## 2014-08-20 DIAGNOSIS — Z66 Do not resuscitate: Secondary | ICD-10-CM | POA: Diagnosis present

## 2014-08-20 DIAGNOSIS — E876 Hypokalemia: Secondary | ICD-10-CM | POA: Diagnosis present

## 2014-08-20 DIAGNOSIS — Z7901 Long term (current) use of anticoagulants: Secondary | ICD-10-CM | POA: Diagnosis not present

## 2014-08-20 DIAGNOSIS — R1900 Intra-abdominal and pelvic swelling, mass and lump, unspecified site: Secondary | ICD-10-CM | POA: Diagnosis present

## 2014-08-20 DIAGNOSIS — Z888 Allergy status to other drugs, medicaments and biological substances status: Secondary | ICD-10-CM | POA: Diagnosis not present

## 2014-08-20 DIAGNOSIS — I5020 Unspecified systolic (congestive) heart failure: Secondary | ICD-10-CM | POA: Insufficient documentation

## 2014-08-20 DIAGNOSIS — N184 Chronic kidney disease, stage 4 (severe): Secondary | ICD-10-CM | POA: Diagnosis present

## 2014-08-20 DIAGNOSIS — I255 Ischemic cardiomyopathy: Secondary | ICD-10-CM | POA: Diagnosis present

## 2014-08-20 DIAGNOSIS — I129 Hypertensive chronic kidney disease with stage 1 through stage 4 chronic kidney disease, or unspecified chronic kidney disease: Secondary | ICD-10-CM | POA: Diagnosis present

## 2014-08-20 DIAGNOSIS — E785 Hyperlipidemia, unspecified: Secondary | ICD-10-CM | POA: Diagnosis present

## 2014-08-20 DIAGNOSIS — I4891 Unspecified atrial fibrillation: Secondary | ICD-10-CM

## 2014-08-20 DIAGNOSIS — E780 Pure hypercholesterolemia: Secondary | ICD-10-CM | POA: Diagnosis present

## 2014-08-20 LAB — BASIC METABOLIC PANEL
ANION GAP: 16 — AB (ref 5–15)
ANION GAP: 9 (ref 5–15)
Anion gap: 7 (ref 5–15)
BUN: 46 mg/dL — AB (ref 6–23)
BUN: 47 mg/dL — ABNORMAL HIGH (ref 6–23)
BUN: 48 mg/dL — AB (ref 6–23)
CALCIUM: 8.5 mg/dL (ref 8.4–10.5)
CHLORIDE: 84 mmol/L — AB (ref 96–112)
CHLORIDE: 84 mmol/L — AB (ref 96–112)
CO2: 26 mmol/L (ref 19–32)
CO2: 31 mmol/L (ref 19–32)
CO2: 32 mmol/L (ref 19–32)
CREATININE: 1.58 mg/dL — AB (ref 0.50–1.35)
Calcium: 8.3 mg/dL — ABNORMAL LOW (ref 8.4–10.5)
Calcium: 8.5 mg/dL (ref 8.4–10.5)
Chloride: 86 mmol/L — ABNORMAL LOW (ref 96–112)
Creatinine, Ser: 1.59 mg/dL — ABNORMAL HIGH (ref 0.50–1.35)
Creatinine, Ser: 1.72 mg/dL — ABNORMAL HIGH (ref 0.50–1.35)
GFR calc Af Amer: 42 mL/min — ABNORMAL LOW (ref >90)
GFR calc Af Amer: 46 mL/min — ABNORMAL LOW (ref >90)
GFR calc non Af Amer: 36 mL/min — ABNORMAL LOW (ref >90)
GFR calc non Af Amer: 40 mL/min — ABNORMAL LOW (ref >90)
GFR, EST AFRICAN AMERICAN: 46 mL/min — AB (ref >90)
GFR, EST NON AFRICAN AMERICAN: 40 mL/min — AB (ref >90)
GLUCOSE: 101 mg/dL — AB (ref 70–99)
GLUCOSE: 151 mg/dL — AB (ref 70–99)
Glucose, Bld: 172 mg/dL — ABNORMAL HIGH (ref 70–99)
Potassium: 2.8 mmol/L — ABNORMAL LOW (ref 3.5–5.1)
Potassium: 3.2 mmol/L — ABNORMAL LOW (ref 3.5–5.1)
Potassium: 3.3 mmol/L — ABNORMAL LOW (ref 3.5–5.1)
SODIUM: 126 mmol/L — AB (ref 135–145)
SODIUM: 123 mmol/L — AB (ref 135–145)
Sodium: 126 mmol/L — ABNORMAL LOW (ref 135–145)

## 2014-08-20 LAB — PROTIME-INR
INR: 1.84 — ABNORMAL HIGH (ref 0.00–1.49)
Prothrombin Time: 21.4 seconds — ABNORMAL HIGH (ref 11.6–15.2)

## 2014-08-20 MED ORDER — POTASSIUM CHLORIDE 10 MEQ/100ML IV SOLN
10.0000 meq | Freq: Once | INTRAVENOUS | Status: AC
  Administered 2014-08-20: 10 meq via INTRAVENOUS
  Filled 2014-08-20: qty 100

## 2014-08-20 MED ORDER — SODIUM CHLORIDE 0.9 % IJ SOLN
10.0000 mL | INTRAMUSCULAR | Status: DC | PRN
  Administered 2014-08-21 – 2014-08-22 (×3): 10 mL
  Administered 2014-08-23: 20 mL
  Filled 2014-08-20 (×4): qty 40

## 2014-08-20 MED ORDER — SORBITOL 70 % SOLN
30.0000 mL | Freq: Every day | Status: DC | PRN
  Filled 2014-08-20: qty 30

## 2014-08-20 MED ORDER — WARFARIN SODIUM 2 MG PO TABS
2.0000 mg | ORAL_TABLET | ORAL | Status: DC
  Administered 2014-08-21 – 2014-08-23 (×2): 2 mg via ORAL
  Filled 2014-08-20 (×2): qty 1

## 2014-08-20 MED ORDER — POTASSIUM CHLORIDE CRYS ER 20 MEQ PO TBCR
40.0000 meq | EXTENDED_RELEASE_TABLET | Freq: Once | ORAL | Status: AC
  Administered 2014-08-20: 40 meq via ORAL
  Filled 2014-08-20: qty 2

## 2014-08-20 MED ORDER — ACETAMINOPHEN 325 MG PO TABS
325.0000 mg | ORAL_TABLET | Freq: Four times a day (QID) | ORAL | Status: DC | PRN
  Administered 2014-08-23: 325 mg via ORAL
  Filled 2014-08-20: qty 1

## 2014-08-20 MED ORDER — MILRINONE IN DEXTROSE 20 MG/100ML IV SOLN
0.5000 ug/kg/min | INTRAVENOUS | Status: DC
  Administered 2014-08-20 – 2014-08-23 (×9): 0.5 ug/kg/min via INTRAVENOUS
  Filled 2014-08-20 (×9): qty 100

## 2014-08-20 MED ORDER — SODIUM CHLORIDE 0.9 % IJ SOLN
10.0000 mL | Freq: Two times a day (BID) | INTRAMUSCULAR | Status: DC
  Administered 2014-08-20 – 2014-08-22 (×6): 10 mL

## 2014-08-20 MED ORDER — METOCLOPRAMIDE HCL 10 MG PO TABS
10.0000 mg | ORAL_TABLET | Freq: Two times a day (BID) | ORAL | Status: DC
  Administered 2014-08-20 – 2014-08-23 (×7): 10 mg via ORAL
  Filled 2014-08-20 (×8): qty 1

## 2014-08-20 MED ORDER — SPIRONOLACTONE 50 MG PO TABS
50.0000 mg | ORAL_TABLET | Freq: Every day | ORAL | Status: DC
  Administered 2014-08-20 – 2014-08-23 (×4): 50 mg via ORAL
  Filled 2014-08-20 (×4): qty 1

## 2014-08-20 MED ORDER — AMIODARONE HCL 200 MG PO TABS
200.0000 mg | ORAL_TABLET | Freq: Every day | ORAL | Status: DC
  Administered 2014-08-20 – 2014-08-23 (×4): 200 mg via ORAL
  Filled 2014-08-20 (×4): qty 1

## 2014-08-20 MED ORDER — WARFARIN - PHARMACIST DOSING INPATIENT
Freq: Every day | Status: DC
  Administered 2014-08-20 – 2014-08-21 (×2)

## 2014-08-20 MED ORDER — WARFARIN SODIUM 4 MG PO TABS
4.0000 mg | ORAL_TABLET | ORAL | Status: DC
  Administered 2014-08-20 – 2014-08-22 (×2): 4 mg via ORAL
  Filled 2014-08-20 (×2): qty 1

## 2014-08-20 MED ORDER — MAGNESIUM OXIDE 400 (241.3 MG) MG PO TABS
400.0000 mg | ORAL_TABLET | Freq: Two times a day (BID) | ORAL | Status: DC
  Administered 2014-08-20 – 2014-08-23 (×8): 400 mg via ORAL
  Filled 2014-08-20 (×9): qty 1

## 2014-08-20 MED ORDER — SILDENAFIL CITRATE 20 MG PO TABS
20.0000 mg | ORAL_TABLET | Freq: Three times a day (TID) | ORAL | Status: DC
  Administered 2014-08-20 – 2014-08-23 (×10): 20 mg via ORAL
  Filled 2014-08-20 (×19): qty 1

## 2014-08-20 MED ORDER — SENNOSIDES-DOCUSATE SODIUM 8.6-50 MG PO TABS
1.0000 | ORAL_TABLET | Freq: Two times a day (BID) | ORAL | Status: DC | PRN
Start: 2014-08-20 — End: 2014-08-23
  Filled 2014-08-20: qty 1

## 2014-08-20 MED ORDER — FUROSEMIDE 10 MG/ML IJ SOLN
120.0000 mg | Freq: Two times a day (BID) | INTRAVENOUS | Status: DC
  Administered 2014-08-20 – 2014-08-22 (×5): 120 mg via INTRAVENOUS
  Filled 2014-08-20 (×6): qty 12

## 2014-08-20 MED ORDER — SORBITOL 70 % PO SOLN
30.0000 mL | Freq: Every day | ORAL | Status: DC | PRN
  Filled 2014-08-20: qty 30

## 2014-08-20 MED ORDER — POLYETHYLENE GLYCOL 3350 17 G PO PACK
17.0000 g | PACK | Freq: Every day | ORAL | Status: DC
  Administered 2014-08-20 – 2014-08-23 (×4): 17 g via ORAL
  Filled 2014-08-20 (×4): qty 1

## 2014-08-20 NOTE — Progress Notes (Signed)
Mr Tyler Christensen is well known to HF team and followed closely in the HF clinic due to end stage HF.   Admitted with volume overload and nausea.    NYHA IVD on home milrinone 0.5 mcg via PICC. WBC on admit 14.9. Blood cultures obtained. Afebrile.  Diuresing with IV lasix. Weight up 5-8 pounds. We are supplementing potassium . BNP lower from last time.   Unfortunately he continues to decline despite maximum milrinone.   Dr Haroldine Laws to evaluate in am.   Anaiah Mcmannis NP-C  9:16 AM

## 2014-08-20 NOTE — Progress Notes (Signed)
ANTICOAGULATION/INOTROPE CONSULT NOTE - Initial Consult  Pharmacy Consult for Coumadin and milrinone Indication: atrial fibrillation and heart failure  Allergies  Allergen Reactions  . Lisinopril Rash    Patient Measurements: Height: 5\' 8"  (172.7 cm) Weight: 145 lb (65.772 kg) IBW/kg (Calculated) : 68.4  Vital Signs: Temp: 98.1 F (36.7 C) (03/01 2218) Temp Source: Oral (03/01 2218) BP: 98/56 mmHg (03/02 0030) Pulse Rate: 76 (03/02 0200)  Labs:  Recent Labs  08/17/14 0420 08/19/14 08/19/14 2218 08/19/14 2223 08/19/14 2340  HGB  --   --  10.8* 13.3  --   HCT  --   --  32.1* 39.0  --   PLT  --   --  396  --   --   LABPROT 27.5*  --   --   --   --   INR 2.54* 2.5  --   --   --   CREATININE 1.49*  --   --  1.70* 1.72*    Estimated Creatinine Clearance: 32.4 mL/min (by C-G formula based on Cr of 1.72).   Medical History: Past Medical History  Diagnosis Date  . HTN (hypertension)   . High cholesterol   . CHF (congestive heart failure)   . BBBB (bilateral bundle branch block)   . LBBB (left bundle branch block)   . Seizures 06/11/2012    new onset/notes (06/11/2012)  . SOB (shortness of breath)     "sometimes when I lay down;; related to not taking my RX" (06/11/2012)  . Myocardial infarction     06/10/2012  . Atrial fibrillation   . Stroke   . Atrial thrombus     left  . Pulmonary HTN   . Atrial fibrillation 06/12/2012      Assessment: 81yo male discharged 2/28 after stay for acute on chronic CHF now c/o SOB since am as well as CP, admitted for recurrent decompensation, to continue Coumadin for Afib and milrinone for CHF; INR was 2.5 yesterday at outpt anticoag visit.  Goal of Therapy:  INR 2-3   Plan:  Will continue home Coumadin dose of 4mg  MWFSun and 2mg  TTSat and monitor INR for dose adjustments; will continue home milrinone infusion of 0.5 mcg/kg/min and follow w/ cards.  Wynona Neat, PharmD, BCPS  08/20/2014,3:22 AM

## 2014-08-20 NOTE — Progress Notes (Signed)
Patient transported to [facility] by stretcher from the ED at 0330. Patient had no complaints of pain. He said he was cold -- given heated blankets.  Hooked him up to IVPB lasix as well as his milrinone drip.  BP seems to be low at 86 systolic but will continue to check q4h as he is on the milrinone drip. Will continue to monitor for changes and the urine output.

## 2014-08-20 NOTE — Progress Notes (Signed)
[facility]  Patient Status: Active pt with [facility] prior to this readmission  [facility] is providing the following services: HHRN and Home Infusion Pharmacy team for home milrinone. [facility] hospital team will follow [name] while here to support transition home when deemed appropriate.  If patient discharges after hours, please call [phone].   Tyler Christensen 08/20/2014, 12:53 PM

## 2014-08-20 NOTE — H&P (Addendum)
RFA: decompensated Class IV heart failure  HPI: [name] is a 81yoM with hx of ischemic cardiomyopathy s/p biv-icd, class IV HF on milrinone, pulmonary hypertension on revatio, CVA with consequent seizure disorder, paf on coumadin, recent admission for decompensated HF from 79/24/16 - 08/17/14 who presents with abdominal fullness, constipation, severe orthopnea (needs to sit upright), and dyspnea with any exertion.  Mr. Mccombie states he has been adherent to his medical regimen since discharge two days prior, but he has already gained 5 pounds.  Of note, he lives at home with his wife.  12 point ROS is otherwise unremarkable, specifically denies fevers, dysuria  PMHx: Past Medical History  Diagnosis Date  . HTN (hypertension)   . High cholesterol   . CHF (congestive heart failure)   . BBBB (bilateral bundle branch block)   . LBBB (left bundle branch block)   . Seizures 06/11/2012    new onset/notes (06/11/2012)  . SOB (shortness of breath)     "sometimes when I lay down;; related to not taking my RX" (06/11/2012)  . Myocardial infarction     06/10/2012  . Atrial fibrillation   . Stroke   . Atrial thrombus     left  . Pulmonary HTN   . Atrial fibrillation 06/12/2012   Meds: No current facility-administered medications on file prior to encounter.   Current Outpatient Prescriptions on File Prior to Encounter  Medication Sig Dispense Refill  . acetaminophen (TYLENOL) 325 MG tablet Take 2 tablets (650 mg total) by mouth every 6 (six) hours as needed for mild pain (or Fever >/= 101).    Marland Kitchen amiodarone (PACERONE) 200 MG tablet Take 1 tablet (200 mg total) by mouth daily. 30 tablet 0  . magnesium oxide (MAG-OX) 400 MG tablet Take 1 tablet (400 mg total) by mouth 2 (two) times daily. 60 tablet 3  . metoCLOPramide (REGLAN) 10 MG tablet Take 1 tablet (10 mg total) by mouth 2 (two) times daily. 60 tablet 3  . metolazone (ZAROXOLYN) 2.5 MG tablet Take 2.5 mg by mouth 2 (two) times a week. Take  Sunday and Thursday    . milrinone (PRIMACOR) 20 MG/100ML SOLN infusion Inject 33.95 mcg/min into the vein continuous. 100 mL 0  . Oxycodone HCl 10 MG TABS Take 10 mg by mouth every 4 (four) hours as needed (pain).    . potassium chloride SA (K-DUR,KLOR-CON) 20 MEQ tablet Take 1 tablet (20 mEq total) by mouth 2 (two) times daily. 40 tablet 0  . senna (SENOKOT) 8.6 MG TABS tablet Take 2 tablets by mouth 2 (two) times daily.  0  . senna-docusate (SENOKOT-S) 8.6-50 MG per tablet Take 2 tablets by mouth 2 (two) times daily. 60 tablet 3  . sildenafil (REVATIO) 20 MG tablet Take 1 tablet (20 mg total) by mouth 3 (three) times daily. 90 tablet 3  . sorbitol 70 % SOLN Take 30 mLs by mouth daily. 500 mL 3  . spironolactone (ALDACTONE) 25 MG tablet Take 2 tablets (50 mg total) by mouth daily. 60 tablet 3  . torsemide (DEMADEX) 20 MG tablet Take 4 tablets (80 mg total) by mouth daily. 120 tablet 3  . traMADol (ULTRAM) 50 MG tablet Take 1 tablet (50 mg total) by mouth 2 (two) times daily as needed. for pain 60 tablet 0  . warfarin (COUMADIN) 4 MG tablet Take 4 mg Monday, Wednesday, Friday and Sunday. 2 mg on Tues, Thurs and Sat 45 tablet 3   Fam Hx: Family History  Problem Relation  Age of Onset  . Heart attack      father had MI in his 49s, no fhx of early heart problem before age 18  . Heart attack      mother died of MI around 70   Soc Hx: History   Social History  . Marital Status: Divorced    Spouse Name: N/A  . Number of Children: N/A  . Years of Education: N/A   Occupational History  . Not on file.   Social History Main Topics  . Smoking status: Never Smoker   . Smokeless tobacco: Never Used  . Alcohol Use: No  . Drug Use: No  . Sexual Activity: No   Other Topics Concern  . Not on file   Social History Narrative    Allergy: Allergies  Allergen Reactions  . Lisinopril Rash   Physical Exam: BP 98/56 mmHg  Pulse 76  Temp(Src) 98.1 F (36.7 C) (Oral)  Resp 20  Ht 5\' 8"   (1.727 m)  Wt 65.772 kg (145 lb)  BMI 22.05 kg/m2  SpO2 96% 81yoM, frail, sitting upright, breathing comfortably JVP to mandible sitting upright RRR, s1, s2, no mrg Distended abdomen, soft Bilateral edema AAO, spanish speaking, no gross focal deficit  Labs: BNP 1755.0  WBC 14.9 HC 32  Na 125 K 3.3 Cr 1.72  ECG: 08/19/14 A-S, LV paced  Impression/Plan 81yoM admitted with end-stage HF on milrinone, PAH on revatio, who presents with recurrent HF exacerbation after unable to tolerate succeed with outpatient diuretic regimen.  Likely with gut edema from increased R sided pressures.  Plan: Decompensated acute on chronic systolic HF - Strict I/Os - Lasix 120 IV bid - c/w milrinone @ 0.5 mcg/kg/min - c/w aldactone 50 qd - attempt to diurese to 140 pounds  PHTN - c/w sildinafeil 20 tid for pHTN  Sz D/o -c/w Reglan 10 bid  Constipation, ?ogilvie's from severe LV dysfunction and pHTN - c/w mg oxid 400 bid - aggressive bowel regimen - hold narcotic prns  Afib -  c/w warfarin per pharm - c/w amio  Leukocytosis: - stress response from end stage heart failure vs. Smoldering infection - given likely ascites, would be concerned for bacterial gut translocation, will check surveilance cultures.  Denies any symptoms of infection.  Hypokalemia - Aggressive K repletion, K > 4  Goals of Care: - pt wishes to be DNAR  Terressa Koyanagi, MD Cardiology Fellow

## 2014-08-20 NOTE — Progress Notes (Signed)
[phone] Paged NP [name] Clegg regarding pt potassium level 2.8.

## 2014-08-21 DIAGNOSIS — I5023 Acute on chronic systolic (congestive) heart failure: Secondary | ICD-10-CM

## 2014-08-21 LAB — BASIC METABOLIC PANEL
ANION GAP: 12 (ref 5–15)
BUN: 40 mg/dL — ABNORMAL HIGH (ref 6–23)
CO2: 29 mmol/L (ref 19–32)
Calcium: 8.6 mg/dL (ref 8.4–10.5)
Chloride: 87 mmol/L — ABNORMAL LOW (ref 96–112)
Creatinine, Ser: 1.38 mg/dL — ABNORMAL HIGH (ref 0.50–1.35)
GFR, EST AFRICAN AMERICAN: 55 mL/min — AB (ref >90)
GFR, EST NON AFRICAN AMERICAN: 47 mL/min — AB (ref >90)
Glucose, Bld: 123 mg/dL — ABNORMAL HIGH (ref 70–99)
POTASSIUM: 3.5 mmol/L (ref 3.5–5.1)
Sodium: 128 mmol/L — ABNORMAL LOW (ref 135–145)

## 2014-08-21 LAB — PROTIME-INR
INR: 1.9 — ABNORMAL HIGH (ref 0.00–1.49)
Prothrombin Time: 22 seconds — ABNORMAL HIGH (ref 11.6–15.2)

## 2014-08-21 MED ORDER — ALTEPLASE 100 MG IV SOLR
2.0000 mg | Freq: Once | INTRAVENOUS | Status: AC
  Administered 2014-08-21: 2 mg
  Filled 2014-08-21 (×2): qty 2

## 2014-08-21 NOTE — Progress Notes (Signed)
ANTICOAGULATION/INOTROPE CONSULT NOTE - Initial Consult  Pharmacy Consult for Coumadin and milrinone Indication: atrial fibrillation and heart failure  Allergies  Allergen Reactions  . Lisinopril Rash    Patient Measurements: Height: 5\' 8"  (172.7 cm) Weight: 135 lb 4.8 oz (61.372 kg) (C) IBW/kg (Calculated) : 68.4  Vital Signs: Temp: 97.9 F (36.6 C) (03/03 1300) Temp Source: Oral (03/03 1300) BP: 88/45 mmHg (03/03 1300) Pulse Rate: 77 (03/03 1300)  Labs:  Recent Labs  08/19/14 08/19/14 2218 08/19/14 2223  08/20/14 0508 08/20/14 1430 08/21/14 0545  HGB  --  10.8* 13.3  --   --   --   --   HCT  --  32.1* 39.0  --   --   --   --   PLT  --  396  --   --   --   --   --   LABPROT  --   --   --   --  21.4*  --  22.0*  INR 2.5  --   --   --  1.84*  --  1.90*  CREATININE  --   --  1.70*  < > 1.59* 1.58* 1.38*  < > = values in this interval not displayed.  Estimated Creatinine Clearance: 37.7 mL/min (by C-G formula based on Cr of 1.38).   Medical History: Past Medical History  Diagnosis Date  . HTN (hypertension)   . High cholesterol   . CHF (congestive heart failure)   . BBBB (bilateral bundle branch block)   . LBBB (left bundle branch block)   . Seizures 06/11/2012    new onset/notes (06/11/2012)  . SOB (shortness of breath)     "sometimes when I lay down;; related to not taking my RX" (06/11/2012)  . Myocardial infarction     06/10/2012  . Atrial fibrillation   . Stroke   . Atrial thrombus     left  . Pulmonary HTN   . Atrial fibrillation 06/12/2012      Assessment: 81yo male discharged 2/28 after stay for acute on chronic CHF now c/o SOB since am as well as CP, admitted for recurrent decompensation, to continue Coumadin for Afib and milrinone for HF; INR was 2.5 at outpt anticoag visit but 1.84 on admission 3/2 continued home dose INR increased over night 1.9 - continue home dose.  Goal of Therapy:  INR 2-3   Plan:  Home Coumadin dose of 4mg  MWFSun  and 2mg  TTSat   monitor INR for dose adjustments;  will continue home milrinone infusion of 0.5 mcg/kg/min and follow w/ cards.  Bonnita Nasuti Pharm.D. CPP, BCPS Clinical Pharmacist [phone] 08/21/2014 3:54 PM

## 2014-08-21 NOTE — Progress Notes (Signed)
Advanced Heart Failure Rounding Note   Subjective:    Feels better. No further CP or SOB. Insists he is taking all of his meds at home despite what [facility] says.     Objective:   Weight Range:  Vital Signs:   Temp:  [97.9 F (36.6 C)-98.8 F (37.1 C)] 97.9 F (36.6 C) (03/03 1300) Pulse Rate:  [71-80] 77 (03/03 1300) Resp:  [18] 18 (03/03 1300) BP: (83-100)/(45-55) 88/45 mmHg (03/03 1300) SpO2:  [95 %-99 %] 95 % (03/03 0700) Weight:  [135 lb 4.8 oz (61.372 kg)] 135 lb 4.8 oz (61.372 kg) (03/03 0600) Last BM Date: 08/18/14  Weight change: Filed Weights   08/19/14 2214 08/20/14 0421 08/21/14 0600  Weight: 145 lb (65.772 kg) 135 lb 9.6 oz (61.508 kg) 135 lb 4.8 oz (61.372 kg)    Intake/Output:   Intake/Output Summary (Last 24 hours) at 08/21/14 1755 Last data filed at 08/21/14 1630  Gross per 24 hour  Intake 1749.5 ml  Output   2225 ml  Net -475.5 ml     Physical Exam: General: Elderly, chronically ill appearing lying flat in bed. NAD.No resp difficulty;  HEENT: normal Neck: supple. JVP8; Carotids 2+ bilaterally; no bruits. No lymphadenopathy or thryomegaly appreciated.  Cor: PMI laterally displaced. Regular rhythm and rate. No s3 or murmur.  Lungs: clear Abdomen: soft, nontender, mildy distended. No hepatosplenomegaly. No bruits or masses. Good bowel sounds. Extremities: no cyanosis, clubbing, rash. RLE LLE trace edema.   Neuro: alert & orientedx3, cranial nerves grossly intact. Moves all 4 extremities w/o difficulty. Affect pleasant.  Telemetry: SR  Labs: Basic Metabolic Panel:  Recent Labs Lab 08/17/14 0420 08/19/14 2223 08/19/14 2340 08/20/14 0508 08/20/14 1430 08/21/14 0545  NA 127* 126* 126* 126* 123* 128*  K 4.3 3.2* 3.3* 2.8* 3.2* 3.5  CL 86* 84* 84* 86* 84* 87*  CO2 31  --  26 31 32 29  GLUCOSE 135* 111* 101* 151* 172* 123*  BUN 55* 45* 48* 47* 46* 40*  CREATININE 1.49* 1.70* 1.72* 1.59* 1.58* 1.38*  CALCIUM 8.6  --  8.5 8.5 8.3* 8.6     Liver Function Tests: No results for input(s): AST, ALT, ALKPHOS, BILITOT, PROT, ALBUMIN in the last 168 hours. No results for input(s): LIPASE, AMYLASE in the last 168 hours. No results for input(s): AMMONIA in the last 168 hours.  CBC:  Recent Labs Lab 08/19/14 2218 08/19/14 2223  WBC 14.9*  --   NEUTROABS 12.7*  --   HGB 10.8* 13.3  HCT 32.1* 39.0  MCV 78.9  --   PLT 396  --     Cardiac Enzymes: No results for input(s): CKTOTAL, CKMB, CKMBINDEX, TROPONINI in the last 168 hours.  BNP: BNP (last 3 results)  Recent Labs  08/13/14 1921 08/19/14 2218  BNP 2249.6* 1755.9*    ProBNP (last 3 results)  Recent Labs  03/01/14 1959 03/08/14 2230 06/05/14 0332  PROBNP 11880.0* 15334.0* 19533.0*      Other results:    Imaging: Dg Chest 2 View  08/20/2014   CLINICAL DATA:  Acute onset of substernal chest pain and shortness of breath. Initial encounter.  EXAM: CHEST  2 VIEW  COMPARISON:  Chest radiograph performed 08/13/2014  FINDINGS: The lungs are well-aerated. Vascular congestion is noted, with bilateral central airspace opacities, concerning for pulmonary edema. No definite pleural effusion or pneumothorax is seen.  The heart is enlarged. A pacemaker/AICD is noted at the left chest wall, with leads ending at the right atrium,  right ventricle and coronary sinus. A right-sided tunneled catheter is seen ending within the right atrium. No acute osseous abnormalities are seen.  IMPRESSION: Vascular congestion and cardiomegaly, with bilateral central airspace opacities, concerning for pulmonary edema.   Electronically Signed   By: Garald Balding M.D.   On: 08/20/2014 01:02      Medications:     Scheduled Medications: . amiodarone  200 mg Oral Daily  . furosemide  120 mg Intravenous BID  . magnesium oxide  400 mg Oral BID  . metoCLOPramide  10 mg Oral BID  . polyethylene glycol  17 g Oral Daily  . sildenafil  20 mg Oral TID  . sodium chloride  10-40 mL  Intracatheter Q12H  . spironolactone  50 mg Oral Daily  . warfarin  2 mg Oral Q T,Th,Sat-1800  . warfarin  4 mg Oral Q M,W,F,Su-1800  . Warfarin - Pharmacist Dosing Inpatient   Does not apply q1800     Infusions: . milrinone 0.5 mcg/kg/min (08/21/14 1026)     PRN Medications:  acetaminophen, senna-docusate, sodium chloride, sorbitol   Assessment:   1. Acute on chronic systolic HF (EF 91%) on home milrinone 2. Ab distension/constipation 3. PAH 4. CKD, stage III-IV 5. PAF in NSR on amio 6. Retroperitoneal mass. 7. Hypokalemia 8. Hyponatremia 9. Hypocalcemia 10. DNR   Plan/Discussion:    He is improved with mild diuresis. He is adamant that he is taking all his meds despite what [facility] states. He feels his family is taking good care of him at home and is not interested in Hospice.   Will resume po diuretics. Possibly home in am.    Length of Stay: 1   Glori Bickers MD 08/21/2014, 5:55 PM  Advanced Heart Failure Team Pager [phone] (M-F; [place])  Please contact [facility] for night-coverage after hours (4p -7a ) and weekends on [email or web address]

## 2014-08-22 LAB — PROTIME-INR
INR: 2.05 — ABNORMAL HIGH (ref 0.00–1.49)
PROTHROMBIN TIME: 23.3 s — AB (ref 11.6–15.2)

## 2014-08-22 LAB — BASIC METABOLIC PANEL
ANION GAP: 11 (ref 5–15)
BUN: 37 mg/dL — ABNORMAL HIGH (ref 6–23)
CHLORIDE: 86 mmol/L — AB (ref 96–112)
CO2: 29 mmol/L (ref 19–32)
Calcium: 8.7 mg/dL (ref 8.4–10.5)
Creatinine, Ser: 1.46 mg/dL — ABNORMAL HIGH (ref 0.50–1.35)
GFR calc non Af Amer: 44 mL/min — ABNORMAL LOW (ref >90)
GFR, EST AFRICAN AMERICAN: 51 mL/min — AB (ref >90)
Glucose, Bld: 109 mg/dL — ABNORMAL HIGH (ref 70–99)
POTASSIUM: 3.4 mmol/L — AB (ref 3.5–5.1)
SODIUM: 126 mmol/L — AB (ref 135–145)

## 2014-08-22 MED ORDER — TORSEMIDE 20 MG PO TABS
80.0000 mg | ORAL_TABLET | Freq: Every day | ORAL | Status: DC
  Administered 2014-08-23: 80 mg via ORAL
  Filled 2014-08-22: qty 4

## 2014-08-22 MED ORDER — WARFARIN VIDEO: Freq: Once | Status: DC

## 2014-08-22 NOTE — Progress Notes (Signed)
Advanced Heart Failure Rounding Note   Subjective:    Feels weak. Mild ab pain. No further CP or SOB. Insists he is taking all of his meds at home despite what [facility] says.  Weight and renal function stable.   Objective:   Weight Range:  Vital Signs:   Temp:  [97.5 F (36.4 C)-98 F (36.7 C)] 97.9 F (36.6 C) (03/04 0700) Pulse Rate:  [77-83] 78 (03/04 0700) Resp:  [18] 18 (03/04 0500) BP: (88-109)/(45-56) 97/52 mmHg (03/04 0700) SpO2:  [96 %-100 %] 96 % (03/04 0700) Weight:  [61.961 kg (136 lb 9.6 oz)] 61.961 kg (136 lb 9.6 oz) (03/04 0500) Last BM Date: 08/20/14  Weight change: Filed Weights   08/20/14 0421 08/21/14 0600 08/22/14 0500  Weight: 61.508 kg (135 lb 9.6 oz) 61.372 kg (135 lb 4.8 oz) 61.961 kg (136 lb 9.6 oz)    Intake/Output:   Intake/Output Summary (Last 24 hours) at 08/22/14 1126 Last data filed at 08/22/14 1111  Gross per 24 hour  Intake 2196.03 ml  Output   1550 ml  Net 646.03 ml     Physical Exam: General: Elderly, chronically ill appearing lying flat in bed. NAD.No resp difficulty;  HEENT: normal Neck: supple. JVP8; Carotids 2+ bilaterally; no bruits. No lymphadenopathy or thryomegaly appreciated.  Cor: PMI laterally displaced. Regular rhythm and rate. No s3 or murmur.  Lungs: clear Abdomen: soft, mildly tender in epigastrum, mildy distended. No hepatosplenomegaly. No bruits or masses. Good bowel sounds. Extremities: no cyanosis, clubbing, rash. RLE LLE trace edema.   Neuro: alert & orientedx3, cranial nerves grossly intact. Moves all 4 extremities w/o difficulty. Affect pleasant.  Telemetry: SR  Labs: Basic Metabolic Panel:  Recent Labs Lab 08/19/14 2340 08/20/14 0508 08/20/14 1430 08/21/14 0545 08/22/14 0518  NA 126* 126* 123* 128* 126*  K 3.3* 2.8* 3.2* 3.5 3.4*  CL 84* 86* 84* 87* 86*  CO2 26 31 32 29 29  GLUCOSE 101* 151* 172* 123* 109*  BUN 48* 47* 46* 40* 37*  CREATININE 1.72* 1.59* 1.58* 1.38* 1.46*  CALCIUM 8.5 8.5  8.3* 8.6 8.7    Liver Function Tests: No results for input(s): AST, ALT, ALKPHOS, BILITOT, PROT, ALBUMIN in the last 168 hours. No results for input(s): LIPASE, AMYLASE in the last 168 hours. No results for input(s): AMMONIA in the last 168 hours.  CBC:  Recent Labs Lab 08/19/14 2218 08/19/14 2223  WBC 14.9*  --   NEUTROABS 12.7*  --   HGB 10.8* 13.3  HCT 32.1* 39.0  MCV 78.9  --   PLT 396  --     Cardiac Enzymes: No results for input(s): CKTOTAL, CKMB, CKMBINDEX, TROPONINI in the last 168 hours.  BNP: BNP (last 3 results)  Recent Labs  08/13/14 1921 08/19/14 2218  BNP 2249.6* 1755.9*    ProBNP (last 3 results)  Recent Labs  03/01/14 1959 03/08/14 2230 06/05/14 0332  PROBNP 11880.0* 15334.0* 19533.0*      Other results:    Imaging: No results found.   Medications:     Scheduled Medications: . amiodarone  200 mg Oral Daily  . furosemide  120 mg Intravenous BID  . magnesium oxide  400 mg Oral BID  . metoCLOPramide  10 mg Oral BID  . polyethylene glycol  17 g Oral Daily  . sildenafil  20 mg Oral TID  . sodium chloride  10-40 mL Intracatheter Q12H  . spironolactone  50 mg Oral Daily  . warfarin  2 mg Oral Q  T,Th,Sat-1800  . warfarin  4 mg Oral Q M,W,F,Su-1800  . Warfarin - Pharmacist Dosing Inpatient   Does not apply q1800    Infusions: . milrinone 0.5 mcg/kg/min (08/22/14 0713)    PRN Medications: acetaminophen, senna-docusate, sodium chloride, sorbitol   Assessment:   1. Acute on chronic systolic HF (EF 83%) on home milrinone 2. Ab distension/constipation 3. PAH 4. CKD, stage III-IV 5. PAF in NSR on amio 6. Retroperitoneal mass. 7. Hypokalemia 8. Hyponatremia 9. Hypocalcemia 10. DNR   Plan/Discussion:    He is improved with mild diuresis. He is adamant that he is taking all his meds despite what [facility] states. He feels his family is taking good care of him at home and is not interested in Hospice. However we have struggled  to keep him out of hospital  He doesn't feel well enough to go home today. Will resume po diuretics. Possibly home in am.    Length of Stay: 2   Glori Bickers MD 08/22/2014, 11:26 AM  Advanced Heart Failure Team Pager [phone] (M-F; [place])  Please contact [facility] for night-coverage after hours (4p -7a ) and weekends on [email or web address]

## 2014-08-22 NOTE — Progress Notes (Addendum)
CARE MANAGEMENT NOTE 08/22/2014  Patient:  Tyler Christensen, Tyler Christensen   Account Number:  [id number]  Date Initiated:  08/22/2014  Documentation initiated by:  [facility]  Subjective/Objective Assessment:   CHF     Action/Plan:   Anticipated DC Date:     Anticipated DC Plan:  [place]  CM consult      [place] [facility] Choice  HOME HEALTH  Resumption Of Svcs/PTA Provider   Choice offered to / List presented to:  C-1 Patient        [place] arranged  HH-1 RN      [place].   Status of service:  In process, will continue to follow Medicare Important Message given?  YES (If response is "NO", the following Medicare IM given date fields will be blank) Date Medicare IM given:  08/22/2014 Medicare IM given by:  [facility] Date Additional Medicare IM given:   Additional Medicare IM given by:    Discharge Disposition:  [place]  Per UR Regulation:    If discussed at Long Length of Stay Meetings, dates discussed:    Comments:  08/22/2014 1730 NCM spoke to pt and brother, Tyler Christensen at bedside to translate. Pt states he is active with [facility] for Milrinone gtt. He has RW, and hospital bed at home. Pt states he will discuss with MD about getting electric wheelchair. States getting around is difficult and he is not strong enough to roll manual wheelchair. Waiting final dc plan for HH.  Jonnie Finner RN CCM Case Mgmt phone [phone]

## 2014-08-23 DIAGNOSIS — E871 Hypo-osmolality and hyponatremia: Secondary | ICD-10-CM | POA: Diagnosis present

## 2014-08-23 DIAGNOSIS — I480 Paroxysmal atrial fibrillation: Secondary | ICD-10-CM

## 2014-08-23 DIAGNOSIS — E876 Hypokalemia: Secondary | ICD-10-CM

## 2014-08-23 DIAGNOSIS — I519 Heart disease, unspecified: Secondary | ICD-10-CM

## 2014-08-23 DIAGNOSIS — N184 Chronic kidney disease, stage 4 (severe): Secondary | ICD-10-CM

## 2014-08-23 DIAGNOSIS — Z66 Do not resuscitate: Secondary | ICD-10-CM

## 2014-08-23 LAB — PROTIME-INR
INR: 2.22 — AB (ref 0.00–1.49)
Prothrombin Time: 24.8 seconds — ABNORMAL HIGH (ref 11.6–15.2)

## 2014-08-23 MED ORDER — MILRINONE IN DEXTROSE 20 MG/100ML IV SOLN: 0.5000 ug/kg/min | INTRAVENOUS | Status: DC

## 2014-08-23 MED ORDER — POTASSIUM CHLORIDE 20 MEQ/15ML (10%) PO SOLN
40.0000 meq | Freq: Once | ORAL | Status: AC
  Administered 2014-08-23: 40 meq via ORAL
  Filled 2014-08-23: qty 30

## 2014-08-23 MED ORDER — POLYETHYLENE GLYCOL 3350 17 G PO PACK: 17.0000 g | PACK | Freq: Every day | ORAL | Status: AC

## 2014-08-23 NOTE — Progress Notes (Signed)
SUBJECTIVE: Wants to go home. Denies chest pain and says breathing is good.     Intake/Output Summary (Last 24 hours) at 08/23/14 1251 Last data filed at 08/23/14 1005  Gross per 24 hour  Intake 1406.19 ml  Output   1375 ml  Net  31.19 ml    Current Facility-Administered Medications  Medication Dose Route Frequency Provider Last Rate Last Dose  . acetaminophen (TYLENOL) tablet 325 mg  325 mg Oral Q6H PRN Elsie Amis, MD      . amiodarone (PACERONE) tablet 200 mg  200 mg Oral Daily Elsie Amis, MD   200 mg at 08/23/14 1009  . magnesium oxide (MAG-OX) tablet 400 mg  400 mg Oral BID Elsie Amis, MD   400 mg at 08/23/14 1010  . metoCLOPramide (REGLAN) tablet 10 mg  10 mg Oral BID Elsie Amis, MD   10 mg at 08/23/14 1009  . milrinone (PRIMACOR) 20 MG/100ML (0.2 mg/mL) infusion  0.5 mcg/kg/min Intravenous Continuous Rogue Bussing, RPH 9.9 mL/hr at 08/23/14 0115 0.5 mcg/kg/min at 08/23/14 0115  . polyethylene glycol (MIRALAX / GLYCOLAX) packet 17 g  17 g Oral Daily Elsie Amis, MD   17 g at 08/23/14 1010  . potassium chloride (KLOR-CON) packet 40 mEq  40 mEq Oral Once Herminio Commons, MD      . senna-docusate (Senokot-S) tablet 1 tablet  1 tablet Oral BID PRN Elsie Amis, MD      . sildenafil (REVATIO) tablet 20 mg  20 mg Oral TID Elsie Amis, MD   20 mg at 08/23/14 1208  . sodium chloride 0.9 % injection 10-40 mL  10-40 mL Intracatheter Q12H Jolaine Artist, MD   10 mL at 08/22/14 2200  . sodium chloride 0.9 % injection 10-40 mL  10-40 mL Intracatheter PRN Jolaine Artist, MD   20 mL at 08/23/14 0517  . sorbitol 70 % solution 30 mL  30 mL Oral Daily PRN Jolaine Artist, MD      . spironolactone (ALDACTONE) tablet 50 mg  50 mg Oral Daily Elsie Amis, MD   50 mg at 08/23/14 1009  . torsemide (DEMADEX) tablet 80 mg  80 mg Oral Daily Jolaine Artist, MD   80 mg at 08/23/14 1010  . warfarin (COUMADIN) tablet 2 mg  2 mg Oral Q  T,Th,Sat-1800 Rogue Bussing, Milo   2 mg at 08/21/14 1712  . warfarin (COUMADIN) tablet 4 mg  4 mg Oral Q M,W,F,Su-1800 Rogue Bussing, RPH   4 mg at 08/22/14 1752  . warfarin (COUMADIN) video   Does not apply Once Tegan K Magsam, RPH      . Warfarin - Pharmacist Dosing Inpatient   Does not apply [address] Rogue Bussing, [facility]        Filed Vitals:   08/22/14 1354 08/22/14 2114 08/23/14 0616 08/23/14 0741  BP: 89/51 91/49 86/41    Pulse: 79 83 83   Temp: 97.6 F (36.4 C) 98 F (36.7 C) 98.4 F (36.9 C)   TempSrc: Oral Oral Oral   Resp: 20 18 19    Height:      Weight:    135 lb 12.9 oz (61.6 kg)  SpO2: 100% 96% 98%     PHYSICAL EXAM General: Elderly, chronically ill appearing sitting up at side of bed. NAD. No resp difficulty. HEENT: normal Neck: supple. No JVD or thryomegaly appreciated.  Cor: PMI laterally displaced.  Regular rhythm and rate. No s3 or murmur.  Lungs: clear Abdomen: soft, mildy distended. Extremities: no cyanosis, clubbing, rash. RLE LLE trace edema.   Neuro: Alert. No gross abnormalities. Affect pleasant.  TELEMETRY: Reviewed telemetry pt in V-paced rhythm.  LABS: Basic Metabolic Panel:  Recent Labs  08/21/14 0545 08/22/14 0518  NA 128* 126*  K 3.5 3.4*  CL 87* 86*  CO2 29 29  GLUCOSE 123* 109*  BUN 40* 37*  CREATININE 1.38* 1.46*  CALCIUM 8.6 8.7   Liver Function Tests: No results for input(s): AST, ALT, ALKPHOS, BILITOT, PROT, ALBUMIN in the last 72 hours. No results for input(s): LIPASE, AMYLASE in the last 72 hours. CBC: No results for input(s): WBC, NEUTROABS, HGB, HCT, MCV, PLT in the last 72 hours. Cardiac Enzymes: No results for input(s): CKTOTAL, CKMB, CKMBINDEX, TROPONINI in the last 72 hours. BNP: Invalid input(s): POCBNP D-Dimer: No results for input(s): DDIMER in the last 72 hours. Hemoglobin A1C: No results for input(s): HGBA1C in the last 72 hours. Fasting Lipid Panel: No results for input(s): CHOL, HDL,  LDLCALC, TRIG, CHOLHDL, LDLDIRECT in the last 72 hours. Thyroid Function Tests: No results for input(s): TSH, T4TOTAL, T3FREE, THYROIDAB in the last 72 hours.  Invalid input(s): FREET3 Anemia Panel: No results for input(s): VITAMINB12, FOLATE, FERRITIN, TIBC, IRON, RETICCTPCT in the last 72 hours.  RADIOLOGY: Dg Chest 2 View  08/20/2014   CLINICAL DATA:  Acute onset of substernal chest pain and shortness of breath. Initial encounter.  EXAM: CHEST  2 VIEW  COMPARISON:  Chest radiograph performed 08/13/2014  FINDINGS: The lungs are well-aerated. Vascular congestion is noted, with bilateral central airspace opacities, concerning for pulmonary edema. No definite pleural effusion or pneumothorax is seen.  The heart is enlarged. A pacemaker/AICD is noted at the left chest wall, with leads ending at the right atrium, right ventricle and coronary sinus. A right-sided tunneled catheter is seen ending within the right atrium. No acute osseous abnormalities are seen.  IMPRESSION: Vascular congestion and cardiomegaly, with bilateral central airspace opacities, concerning for pulmonary edema.   Electronically Signed   By: Garald Balding M.D.   On: 08/20/2014 01:02   Dg Chest 2 View  08/13/2014   CLINICAL DATA:  Chest pressure short of breath since last night.  EXAM: CHEST  2 VIEW  COMPARISON:  Chest radiograph 07/04/2014  FINDINGS: Left-sided pacemaker overlies normal cardiac silhouette. There is fine interstitial pattern which is improved from comparison exam. Small bilateral pleural effusions present. No pneumothorax. Right central venous line is noted.  IMPRESSION: 1. Improvement in interstitial edema pattern. 2. Small effusions.   Electronically Signed   By: Suzy Bouchard M.D.   On: 08/13/2014 19:39      ASSESSMENT AND PLAN: 1. Acute on chronic systolic HF (EF 51%) on home milrinone (end stage) 2. Ab distension/constipation 3. PAH 4. CKD, stage III-IV 5. PAF in NSR on amio 6. Retroperitoneal  mass. 7. Hypokalemia 8. Hyponatremia 9. Hypocalcemia 10. DNR  Reportedly not interested in hospice. Eager to go home today. Will give some potassium repletement. On oral diuretics.  Will discharge later today.   Kate Sable, M.D., F.A.C.C.

## 2014-08-23 NOTE — Progress Notes (Signed)
CARE MANAGEMENT NOTE 08/23/2014  Patient:  Tyler Christensen, Tyler Christensen   Account Number:  [id number]  Date Initiated:  08/22/2014  Documentation initiated by:  [facility]  Subjective/Objective Assessment:   CHF     Action/Plan:   discharge plannng   Anticipated DC Date:  08/23/2014   Anticipated DC Plan:  [place]  CM consult      [facility] Choice  HOME HEALTH  Resumption Of Svcs/PTA Provider   Choice offered to / List presented to:  C-1 Patient        [place] arranged  HH-1 RN      [place].   Status of service:  Completed, signed off Medicare Important Message given?  YES (If response is "NO", the following Medicare IM given date fields will be blank) Date Medicare IM given:  08/22/2014 Medicare IM given by:  [facility] Date Additional Medicare IM given:   Additional Medicare IM given by:    Discharge Disposition:  [place]  Per UR Regulation:    If discussed at Long Length of Stay Meetings, dates discussed:    Comments:  08/23/14 14:00 Cm called [facility] rep, Lecretia to please arrange for [facility] to come to pt's room to hook up milrinone pump. Lecretia arraanging but states RN may not get to room til well after 18:00.  Pt and RN made aware.  NO other CM needs were communicated.  Mariane Masters, BSN, [place].  08/22/2014 1730 NCM spoke to pt and brother, [name] at bedside to translate. Pt states he is active with [facility] for Milrinone gtt. He has RW, and hospital bed at home. Pt states he will discuss with MD about getting electric wheelchair. States getting around is difficult and he is not strong enough to roll manual wheelchair. Waiting final dc plan for HH. Jonnie Finner RN CCM Case Mgmt phone [phone]

## 2014-08-23 NOTE — Discharge Instructions (Signed)
Weigh daily Call [phone] if weight climbs more than 3 pounds in a day or 5 pounds in a week. No salt to very little salt in your diet.  No more than 2000 mg in a day. Call if increased shortness of breath or increased swelling.   continue Milrinone   Continue medications   Keep follow up appointment  We will schedule coumadin clinic appt in 2 weeks the office will call.

## 2014-08-23 NOTE — Progress Notes (Signed)
[place] for Coumadin  Indication: atrial fibrillation  Allergies  Allergen Reactions  . Lisinopril Rash    Patient Measurements: Height: 5\' 8"  (172.7 cm) Weight: 135 lb 12.9 oz (61.6 kg) (scale C) IBW/kg (Calculated) : 68.4  Vital Signs: Temp: 98.4 F (36.9 C) (03/05 0616) Temp Source: Oral (03/05 0616) BP: 86/41 mmHg (03/05 0616) Pulse Rate: 83 (03/05 0616)  Labs:  Recent Labs  08/20/14 1430 08/21/14 0545 08/22/14 0518 08/23/14 0517  LABPROT  --  22.0* 23.3* 24.8*  INR  --  1.90* 2.05* 2.22*  CREATININE 1.58* 1.38* 1.46*  --     Estimated Creatinine Clearance: 35.7 mL/min (by C-G formula based on Cr of 1.46).   Medical History: Past Medical History  Diagnosis Date  . HTN (hypertension)   . High cholesterol   . CHF (congestive heart failure)   . BBBB (bilateral bundle branch block)   . LBBB (left bundle branch block)   . Seizures 06/11/2012    new onset/notes (06/11/2012)  . SOB (shortness of breath)     "sometimes when I lay down;; related to not taking my RX" (06/11/2012)  . Myocardial infarction     06/10/2012  . Atrial fibrillation   . Stroke   . Atrial thrombus     left  . Pulmonary HTN   . Atrial fibrillation 06/12/2012      Assessment: 81yo male discharged 2/28 after stay for acute on chronic CHF to continue Coumadin for Afib and milrinone for HF; INR is therapeutic at 2.2 on continued home dose. No bleeding issues noted.  Goal of Therapy:  INR 2-3   Plan:  Home Coumadin dose of 4mg  MWFSun and 2mg  TTSat  monitor INR for dose adjustments  Erin Hearing PharmD., BCPS Clinical Pharmacist Pager [phone] 08/23/2014 8:30 AM

## 2014-08-23 NOTE — Discharge Summary (Signed)
Physician Discharge Summary       Patient ID: Tyler Christensen MRN: [id number] DOB/AGE: Jan 08, 1933 81 y.o.  Admit date: 08/19/2014 Discharge date: 08/23/2014 Primary Cardiologist:Dr. Bensimhon  Discharge Diagnoses:  Principal Problem:   Acute on chronic systolic and diastolic heart failure, NYHA class 3 Active Problems:   Left bundle branch block   NICM (nonischemic cardiomyopathy)   HLD (hyperlipidemia)   PAH (pulmonary artery hypertension)   Long-term (current) use of anticoagulants   ICD (implantable cardioverter-defibrillator), biventricular, in situ   Acute on chronic systolic heart failure   CKD (chronic kidney disease) stage 3, GFR 30-59 ml/min   DNR (do not resuscitate) discussion   Paroxysmal atrial fibrillation   Hypokalemia   Hyposmolality and/or hyponatremia   Discharged Condition: fair  Weight at discharge 135.12  Procedures: none  Hospital Course: 81yoM with hx of ischemic cardiomyopathy s/p biv-icd, class IV HF on milrinone, pulmonary hypertension on revatio, CVA with consequent seizure disorder, paf on coumadin, recent admission for decompensated HF from 08/13/14 - 08/17/14 who presents with abdominal fullness, constipation, severe orthopnea (needs to sit upright), and dyspnea with any exertion.  Tyler Christensen states he has been adherent to his medical regimen since discharge two days prior, but he has already gained 5 pounds.  Of note, he lives at home with his wife.  Pt not interested in Hospice.  He is a DNR.  He was constipated and miralax added to meds.  He improved with mild diuresing.    He was placed back on po diuretics and his IV milrinone was continued throughout stay and was placed back on home bag prior to discharge.   His wt is listed above on admit 145 lbs.  He continues with decreased Na, and his K+ was up and down during stay.  INR therapeutic at discharge.  Maintained SR.  Blood cultures were done results pending done for leukocytosis with possibly smoldering  infection- remained afebrile.    By Sat the 5th pt expressed desire to go home, his breathing was stable.  He rec'd K+ replacement prior to discharge.     Consults: cardiology  Significant Diagnostic Studies:  BMET    Component Value Date/Time   NA 126* 08/22/2014 0518   K 3.4* 08/22/2014 0518   CL 86* 08/22/2014 0518   CO2 29 08/22/2014 0518   GLUCOSE 109* 08/22/2014 0518   BUN 37* 08/22/2014 0518   CREATININE 1.46* 08/22/2014 0518   CALCIUM 8.7 08/22/2014 0518   GFRNONAA 44* 08/22/2014 0518   GFRAA 51* 08/22/2014 0518   K+ was 3.2 on admit CBC    Component Value Date/Time   WBC 14.9* 08/19/2014 2218   RBC 4.07* 08/19/2014 2218   HGB 13.3 08/19/2014 2223   HCT 39.0 08/19/2014 2223   PLT 396 08/19/2014 2218   MCV 78.9 08/19/2014 2218   MCH 26.5 08/19/2014 2218   MCHC 33.6 08/19/2014 2218   RDW 18.4* 08/19/2014 2218   LYMPHSABS 0.8 08/19/2014 2218   MONOABS 1.2* 08/19/2014 2218   EOSABS 0.2 08/19/2014 2218   BASOSABS 0.0 08/19/2014 2218   Troponin 0.02  BNP 1755  INR at discharge 2.22  CHEST 2 VIEW COMPARISON: Chest radiograph performed 08/13/2014 FINDINGS: The lungs are well-aerated. Vascular congestion is noted, with bilateral central airspace opacities, concerning for pulmonary edema. No definite pleural effusion or pneumothorax is seen.  The heart is enlarged. A pacemaker/AICD is noted at the left chest wall, with leads ending at the right atrium, right ventricle and coronary sinus. A  right-sided tunneled catheter is seen ending within the right atrium. No acute osseous abnormalities are seen. IMPRESSION: Vascular congestion and cardiomegaly, with bilateral central airspace opacities, concerning for pulmonary edema.     Discharge Exam: Blood pressure 86/41, pulse 83, temperature 98.4 F (36.9 C), temperature source Oral, resp. rate 19, height 5\' 8"  (1.727 m), weight 135 lb 12.9 oz (61.6 kg), SpO2 98 %.  Disposition: 06-Home-Health Care  Svc     Medication List    STOP taking these medications        senna-docusate 8.6-50 MG per tablet  Commonly known as:  Senokot-S      TAKE these medications        acetaminophen 325 MG tablet  Commonly known as:  TYLENOL  Take 2 tablets (650 mg total) by mouth every 6 (six) hours as needed for mild pain (or Fever >/= 101).     amiodarone 200 MG tablet  Commonly known as:  PACERONE  Take 1 tablet (200 mg total) by mouth daily.     atorvastatin 20 MG tablet  Commonly known as:  LIPITOR  Take 20 mg by mouth daily.     magnesium oxide 400 MG tablet  Commonly known as:  MAG-OX  Take 1 tablet (400 mg total) by mouth 2 (two) times daily.     metoCLOPramide 10 MG tablet  Commonly known as:  REGLAN  Take 1 tablet (10 mg total) by mouth 2 (two) times daily.     metolazone 2.5 MG tablet  Commonly known as:  ZAROXOLYN  Take 2.5 mg by mouth 2 (two) times a week. Take Sunday and Thursday     milrinone 20 MG/100ML Soln infusion  Commonly known as:  PRIMACOR  Inject 33.95 mcg/min into the vein continuous.     Oxycodone HCl 10 MG Tabs  Take 10 mg by mouth every 4 (four) hours as needed (pain).     polyethylene glycol packet  Commonly known as:  MIRALAX / GLYCOLAX  Take 17 g by mouth daily.     potassium chloride SA 20 MEQ tablet  Commonly known as:  K-DUR,KLOR-CON  Take 1 tablet (20 mEq total) by mouth 2 (two) times daily.     senna 8.6 MG Tabs tablet  Commonly known as:  SENOKOT  Take 2 tablets by mouth 2 (two) times daily.     sildenafil 20 MG tablet  Commonly known as:  REVATIO  Take 1 tablet (20 mg total) by mouth 3 (three) times daily.     sorbitol 70 % Soln  Take 30 mLs by mouth daily.     spironolactone 25 MG tablet  Commonly known as:  ALDACTONE  Take 2 tablets (50 mg total) by mouth daily.     torsemide 20 MG tablet  Commonly known as:  DEMADEX  Take 4 tablets (80 mg total) by mouth daily.     traMADol 50 MG tablet  Commonly known as:  ULTRAM  Take  1 tablet (50 mg total) by mouth 2 (two) times daily as needed. for pain     warfarin 4 MG tablet  Commonly known as:  COUMADIN  Take 4 mg Monday, Wednesday, Friday and Sunday. 2 mg on Tues, Thurs and Sat       Follow-up Information    Follow up with Glori Bickers, MD On 08/28/2014.   Specialty:  Cardiology   Why:  at 11:40am in the [facility]--gate code [id number]-- please bring medications   Contact information:  [place] [address] [phone]       Follow up with [facility].   Why:  Home Health RN   Contact information:   [place] [address] [phone]        Discharge Instructions: Weigh daily Call [phone] if weight climbs more than 3 pounds in a day or 5 pounds in a week. No salt to very little salt in your diet.  No more than 2000 mg in a day. Call if increased shortness of breath or increased swelling.   continue Milrinone   Continue medications   Keep follow up appointment We will schedule coumadin clinic appt in 2 weeks the office will call   Signed: NBVAPO,LIDCV R Nurse Practitioner-Certified [facility]: [facility] 08/23/2014, 2:48 PM  Time spent on discharge : > 30 minutes.

## 2014-08-24 NOTE — Progress Notes (Signed)
Patient discharge to home. Discharged instructions explained to patient's Grandson. Patient's milrinone infusing into Right chest porta cath. Next follow up appointment reviewed with caregiver.

## 2014-08-25 ENCOUNTER — Ambulatory Visit (INDEPENDENT_AMBULATORY_CARE_PROVIDER_SITE_OTHER): Payer: Medicare Other | Admitting: Internal Medicine

## 2014-08-25 DIAGNOSIS — I4891 Unspecified atrial fibrillation: Secondary | ICD-10-CM

## 2014-08-25 LAB — POCT INR: INR: 2.8

## 2014-08-26 LAB — CULTURE, BLOOD (ROUTINE X 2)
CULTURE: NO GROWTH
Culture: NO GROWTH

## 2014-08-27 ENCOUNTER — Telehealth (HOSPITAL_COMMUNITY): Payer: Self-pay | Admitting: Vascular Surgery

## 2014-08-27 NOTE — Telephone Encounter (Signed)
Nurse from [facility] called she wants to talk to someone about pt medicine, pt is not taking all medicines.. Pt bp 80/50  Please advise

## 2014-08-27 NOTE — Telephone Encounter (Signed)
Spoke at length with home health nurse [name] who closely monitors and cares for patient multiple times a week.  She is very concerned for patient and feels he is actively dieing from his heart failure and comorbidities.  Color "looks bad", very gaunt, weight down to 137lbs at home.   [name] spoke with patient and wife and explained that he is terminal and asked about plan of care as he nears final stages.  Patient refuses hospice as he wants to stay with Dr. Haroldine Laws and the heart failure clinic to manage his symptoms.   Nurse also concerned because he is very noncompliant with his medications at home.  For instance, today he had not taken any medications all day, however his BP was 80/50 without any diuretics (should be taking 80mg  torsemide and 50mg  spironolactone once daily).  BP would be dangerously low at this point if he were to actually take these medications, so nurse instructed patient to hold until seen by our clinic tomorrow. Also, patient continues to be non compliant with other medications.  Has become a max assist with minimal exertion/ADL's.  [name] is putting in PT/OT consult order to see if this will help in any way and to see what tools may benefit patient in the home (electric wheelchair, standing recliner, ect.).  Will forward to MD to see if we should maybe remove some medications from his med list that are not absolutely necessary as he does not take them anyway, and focus more on comfort care and symptom management.  [name] with [facility] states she is dedicated to seeing patient more often, 3-4 times a week if necessary to make sure he is comfortable and managed well from a symptom standpoint.  Patient to be seen in HF clinic tomorrow, will have these issues addressed between MD and patient and inform [name] with [facility] of plan of care.  Renee Pain

## 2014-08-28 ENCOUNTER — Ambulatory Visit (HOSPITAL_BASED_OUTPATIENT_CLINIC_OR_DEPARTMENT_OTHER)
Admission: RE | Admit: 2014-08-28 | Discharge: 2014-08-28 | Disposition: A | Discharge status: Home / Self Care | Payer: Medicare Other | Source: Ambulatory Visit | Attending: Internal Medicine | Admitting: Internal Medicine

## 2014-08-28 VITALS — BP 96/42 | HR 73 | Wt 140.4 lb

## 2014-08-28 DIAGNOSIS — I272 Other secondary pulmonary hypertension: Secondary | ICD-10-CM

## 2014-08-28 DIAGNOSIS — I5022 Chronic systolic (congestive) heart failure: Secondary | ICD-10-CM

## 2014-08-28 DIAGNOSIS — G40909 Epilepsy, unspecified, not intractable, without status epilepticus: Secondary | ICD-10-CM | POA: Insufficient documentation

## 2014-08-28 DIAGNOSIS — I252 Old myocardial infarction: Secondary | ICD-10-CM | POA: Insufficient documentation

## 2014-08-28 DIAGNOSIS — Z79899 Other long term (current) drug therapy: Secondary | ICD-10-CM | POA: Insufficient documentation

## 2014-08-28 DIAGNOSIS — I270 Primary pulmonary hypertension: Secondary | ICD-10-CM

## 2014-08-28 DIAGNOSIS — I480 Paroxysmal atrial fibrillation: Secondary | ICD-10-CM

## 2014-08-28 DIAGNOSIS — N183 Chronic kidney disease, stage 3 (moderate): Secondary | ICD-10-CM | POA: Insufficient documentation

## 2014-08-28 DIAGNOSIS — I129 Hypertensive chronic kidney disease with stage 1 through stage 4 chronic kidney disease, or unspecified chronic kidney disease: Secondary | ICD-10-CM | POA: Insufficient documentation

## 2014-08-28 DIAGNOSIS — Z7901 Long term (current) use of anticoagulants: Secondary | ICD-10-CM | POA: Insufficient documentation

## 2014-08-28 DIAGNOSIS — E780 Pure hypercholesterolemia: Secondary | ICD-10-CM

## 2014-08-28 DIAGNOSIS — Z8673 Personal history of transient ischemic attack (TIA), and cerebral infarction without residual deficits: Secondary | ICD-10-CM

## 2014-08-28 DIAGNOSIS — E876 Hypokalemia: Secondary | ICD-10-CM

## 2014-08-28 DIAGNOSIS — R079 Chest pain, unspecified: Secondary | ICD-10-CM | POA: Diagnosis not present

## 2014-08-28 DIAGNOSIS — Z66 Do not resuscitate: Secondary | ICD-10-CM | POA: Insufficient documentation

## 2014-08-28 DIAGNOSIS — R0602 Shortness of breath: Secondary | ICD-10-CM | POA: Diagnosis not present

## 2014-08-28 NOTE — Addendum Note (Signed)
Encounter addended by: Effie Berkshire, RN on: 08/28/2014  1:00 PM<BR>     Documentation filed: Patient Instructions Section

## 2014-08-28 NOTE — Patient Instructions (Signed)
[place] recetas a tienda de suministros mdicos para Electronics engineer y Nurse, children's de compresin .  Arreglar lasix IV en la casa todos los mircoles.  Seguimiento de 4 semanas .

## 2014-08-28 NOTE — Progress Notes (Signed)
Patient ID: Ahmod Gillespie, male   DOB: [birth year], 81 y.o.   MRN: [id number]  [employer]  HPI: Mr. Boehringer is a 81 y.o. [name] (non-english speaking) w/ PMHx significant for chronic systolic CHF (EF 48%), NICM, LBBB, PAF (on coumadin), HTN, HLD, CVA, Pulmonary Hypertension and h/o seizure d/o (2/2 CVA 05/2012).   Admitted to [facility] 06/2012 with acute decompensated heart failure EF 20%. As noted below, also diagnosed with severe pulmonary hypertension (PAPs ~ 90) from initial RHC and placed on Milrinone and Revatio (eventually transitioned to tadalafil 40) with marked clinical response and decrease in PA pressures. VQ low probability for pulmonary embolus.  Discharged on Milrinone at 0.375 mcg via PICC. Discharge Weight 146 pounds. Attempted to wean milrinone in 9/14 but failed due to worsening HF and PAH. Milrinone restarted.  Admitted to the hospital 5/21-11/22/13. Found to have staph aureus bacteremia treated with IV abx. TEE negative for endocarditis. Also found to retroperitoneal mass and biopsy revealed benign lesion. He had evidence of low output thought to be due to atrial fibrillation and milrinone increased. Attempted TEE and DC-CV but had LAA clot so deferred cardioversion.  He was admitted in 12/15 with acute on chronic systolic CHF.  He was diuresed with IV Lasix and milrinone was increased to 0.5 mcg/kg/min.    Admitted 1/16 readmitted last week (1/16) with abdominal distension and pain. Thought to be due to constipation. 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 HF was stable with normal co-ox during admit. Treated with sorbitol.  Admitted again for volume overload and ab pain in 2/16. Diuresed and given sorbitol.   Follow-up:   Here for routine f/u. HHRN very concerned about him. Says he is not taking his medicines routinely. Says he doesn't feel very well. Very fatigued with minimal activity. Still with some ab discomfort and  fullness. Dyspnea comes and goes. No significant edema. Weight stable. Refuses Hospice.   NO ACE-I 2/2 rash   08/14/12 S/P successful DC-CV of AF 10/26/12 S/P BiV-ICD implant 01/07/13 ECHO EF 20%, restrictive diastolic function, mildly decreased RV size and systolic fxn, mild to moderate MR, PA systolic pressure 59 mmHg  02/21/13 ECHO EF 20% PA pressure mean 59 11/12/13: ECHO EF 20-25%, trivial AI,    RHC 02/21/13 (off milrinone) RA = 9 with v-waves to 20  RV = 72/8/20  PA = 69/27 (44)  PCW = 18-20  Fick cardiac output/index = 2.7/1.5  PVR = 9.6 woods  FA sat = 96%  PA sat = 48%, 52%  Labs      (06/25/13) K 4.2 Creatinine 1.58 Magnesium 2.0     (07/16/13) K 4.2, Cr 1.67    (2/15) creatinine 1.97 => 1.94, K 4.6, HCT 39.4    (4/15) K 4.5, creatinine 1.56, pro-BNP 6912    (4/15) K 3.9, creatinine 2.39, mag 2.1     (09/2013) K 3.7, creatinine 2.03     11/26/13 K 4.6 cr 2.8 hgb 13.2     12/11/13 K 3.4 Creatinine 2.14  Magnesium 2.1     12/19/13: K+ 3.4, creatinine 2.42, BUN 57     01/14/14: K 3.8, creatinine 2.45, BUN 45, mag 2.1    12/15 K 3.6, creatinine 1.68, HCT 34.4     07/17/14: K 2.4 creatinine 1.51      08/22/14: K 3.4 creatinine  1.46    SH: Lives with his wife here in [place].   ROS: All systems negative except as listed  in HPI, PMH and Problem List.  Past Medical History  Diagnosis Date  . HTN (hypertension)   . High cholesterol   . CHF (congestive heart failure)   . BBBB (bilateral bundle branch block)   . LBBB (left bundle branch block)   . Seizures 06/11/2012    new onset/notes (06/11/2012)  . SOB (shortness of breath)     "sometimes when I lay down;; related to not taking my RX" (06/11/2012)  . Myocardial infarction     06/10/2012  . Atrial fibrillation   . Stroke   . Atrial thrombus     left  . Pulmonary HTN   . Atrial fibrillation 06/12/2012    Current Outpatient Prescriptions  Medication Sig Dispense Refill  . acetaminophen (TYLENOL) 325 MG tablet Take 2  tablets (650 mg total) by mouth every 6 (six) hours as needed for mild pain (or Fever >/= 101).    Marland Kitchen amiodarone (PACERONE) 200 MG tablet Take 1 tablet (200 mg total) by mouth daily. 30 tablet 0  . magnesium oxide (MAG-OX) 400 MG tablet Take 1 tablet (400 mg total) by mouth 2 (two) times daily. 60 tablet 3  . metoCLOPramide (REGLAN) 10 MG tablet Take 1 tablet (10 mg total) by mouth 2 (two) times daily. 60 tablet 3  . milrinone (PRIMACOR) 20 MG/100ML SOLN infusion Inject 33.95 mcg/min into the vein continuous. 100 mL 0  . polyethylene glycol (MIRALAX / GLYCOLAX) packet Take 17 g by mouth daily. 14 each 6  . potassium chloride SA (K-DUR,KLOR-CON) 20 MEQ tablet Take 1 tablet (20 mEq total) by mouth 2 (two) times daily. 40 tablet 0  . sildenafil (REVATIO) 20 MG tablet Take 1 tablet (20 mg total) by mouth 3 (three) times daily. 90 tablet 3  . sorbitol 70 % SOLN Take 30 mLs by mouth daily. 500 mL 3  . spironolactone (ALDACTONE) 25 MG tablet Take 2 tablets (50 mg total) by mouth daily. 60 tablet 3  . torsemide (DEMADEX) 20 MG tablet Take 4 tablets (80 mg total) by mouth daily. 120 tablet 3  . traMADol (ULTRAM) 50 MG tablet Take 1 tablet (50 mg total) by mouth 2 (two) times daily as needed. for pain 60 tablet 0  . warfarin (COUMADIN) 4 MG tablet Take 4 mg Monday, Wednesday, Friday and Sunday. 2 mg on Tues, Thurs and Sat 45 tablet 3   No current facility-administered medications for this encounter.    Filed Vitals:   08/28/14 1156  BP: 96/42  Pulse: 73  Weight: 140 lb 6.4 oz (63.685 kg)  SpO2: 96%   PHYSICAL EXAM: General:  Elderly, NAD.No resp difficulty; interpreter present Cresenciano Genre) HEENT: normal Neck: supple. JVP 7-8; Carotids 2+ bilaterally; no bruits. No lymphadenopathy or thryomegaly appreciated.  Cor: PMI laterally displaced. Regular rhythm and rate. No s3 or murmur.  Lungs: clear Abdomen: soft, nontender, mildy distended. No hepatosplenomegaly. No bruits or masses. Good bowel  sounds. Extremities: no cyanosis, clubbing, rash. no edema .   Neuro: alert & orientedx3, cranial nerves grossly intact. Moves all 4 extremities w/o difficulty. Affect pleasant.   ASSESSMENT & PLAN:  1. Chronic systolic CHF: NICM, EF 55% s/p Medtronic CRT-D; patient is on milrinone, recently increased to 0.5 mcg/kg/min. Overall relatively stable. NYHA IIIb Volume status stable.  He is not interested in palliative care or hospice at this time. He is a DNR.  - He insists he is taking his medicine. Will give lasix 80 IV once a week on wednesdays with kcl 40  to help maintain volume status - Continue milrinone 0.5 mcg through PICC and will get weekly labs.   - Not on BB with history of low output and milrinone.  - He is not on ACE-I due to rash and no longer on ARB or spironolactone with renal function. - Continue current spironolactone to help with hypokalemia  - Discussed the importance of daily weights, a low sodium diet, and fluid restriction (less than 2 L a day). Instructed to call the HF clinic if weight increases more than 3 lbs overnight or 5 lbs in a week.  2. Paroxysmal atrial fibrillation/flutter: Cardioverted back to NSR 12/27/13. Appears to be in NSR on amiodraone. Will continue amio 200 mg BID. Continue coumadin.  Will need to follow LFTs and TFTs.  3. Pulmonary arterial hypertension: Noted on RHC 02/21/13.  Continue revatio 20 mg TID 40 mg and milrinone.  4. CKD stage III: Creatinine improved on milrinone. Will recheck.  5. Abdominal discomfort/distension: This persists. Will start reglan 10 mg bid. Get gastric emptying study and refer to GI.  6. Hypokalemia: Arlyce Harman increased will recheck BMET  Will help him apply for motorized scooted. Place TED hose.    Benay Spice 08/28/2014

## 2014-08-29 ENCOUNTER — Emergency Department (HOSPITAL_COMMUNITY): Payer: Medicare Other

## 2014-08-29 ENCOUNTER — Inpatient Hospital Stay (HOSPITAL_COMMUNITY)
Admission: EM | Admit: 2014-08-29 | Discharge: 2014-08-31 | DRG: 313 | Disposition: A | Discharge status: Home / Self Care | Payer: Medicare Other | Attending: Internal Medicine | Admitting: Internal Medicine

## 2014-08-29 ENCOUNTER — Encounter (HOSPITAL_COMMUNITY): Payer: Self-pay

## 2014-08-29 DIAGNOSIS — G40909 Epilepsy, unspecified, not intractable, without status epilepticus: Secondary | ICD-10-CM | POA: Diagnosis present

## 2014-08-29 DIAGNOSIS — C480 Malignant neoplasm of retroperitoneum: Secondary | ICD-10-CM | POA: Diagnosis present

## 2014-08-29 DIAGNOSIS — E871 Hypo-osmolality and hyponatremia: Secondary | ICD-10-CM | POA: Diagnosis present

## 2014-08-29 DIAGNOSIS — E876 Hypokalemia: Secondary | ICD-10-CM | POA: Diagnosis present

## 2014-08-29 DIAGNOSIS — I482 Chronic atrial fibrillation: Secondary | ICD-10-CM | POA: Diagnosis not present

## 2014-08-29 DIAGNOSIS — R112 Nausea with vomiting, unspecified: Secondary | ICD-10-CM

## 2014-08-29 DIAGNOSIS — I5022 Chronic systolic (congestive) heart failure: Secondary | ICD-10-CM | POA: Diagnosis present

## 2014-08-29 DIAGNOSIS — I480 Paroxysmal atrial fibrillation: Secondary | ICD-10-CM | POA: Diagnosis present

## 2014-08-29 DIAGNOSIS — Z66 Do not resuscitate: Secondary | ICD-10-CM | POA: Diagnosis present

## 2014-08-29 DIAGNOSIS — I272 Other secondary pulmonary hypertension: Secondary | ICD-10-CM | POA: Diagnosis not present

## 2014-08-29 DIAGNOSIS — I252 Old myocardial infarction: Secondary | ICD-10-CM

## 2014-08-29 DIAGNOSIS — Z7901 Long term (current) use of anticoagulants: Secondary | ICD-10-CM

## 2014-08-29 DIAGNOSIS — I429 Cardiomyopathy, unspecified: Secondary | ICD-10-CM | POA: Diagnosis present

## 2014-08-29 DIAGNOSIS — I129 Hypertensive chronic kidney disease with stage 1 through stage 4 chronic kidney disease, or unspecified chronic kidney disease: Secondary | ICD-10-CM | POA: Diagnosis present

## 2014-08-29 DIAGNOSIS — R072 Precordial pain: Secondary | ICD-10-CM | POA: Diagnosis present

## 2014-08-29 DIAGNOSIS — R079 Chest pain, unspecified: Principal | ICD-10-CM | POA: Diagnosis present

## 2014-08-29 DIAGNOSIS — I5043 Acute on chronic combined systolic (congestive) and diastolic (congestive) heart failure: Secondary | ICD-10-CM | POA: Insufficient documentation

## 2014-08-29 DIAGNOSIS — E785 Hyperlipidemia, unspecified: Secondary | ICD-10-CM | POA: Diagnosis present

## 2014-08-29 DIAGNOSIS — Z79899 Other long term (current) drug therapy: Secondary | ICD-10-CM

## 2014-08-29 DIAGNOSIS — Z8673 Personal history of transient ischemic attack (TIA), and cerebral infarction without residual deficits: Secondary | ICD-10-CM

## 2014-08-29 DIAGNOSIS — R627 Adult failure to thrive: Secondary | ICD-10-CM | POA: Diagnosis present

## 2014-08-29 DIAGNOSIS — N184 Chronic kidney disease, stage 4 (severe): Secondary | ICD-10-CM | POA: Diagnosis present

## 2014-08-29 LAB — CBC WITH DIFFERENTIAL/PLATELET
Basophils Absolute: 0 10*3/uL (ref 0.0–0.1)
Basophils Relative: 0 % (ref 0–1)
EOS ABS: 0.3 10*3/uL (ref 0.0–0.7)
Eosinophils Relative: 2 % (ref 0–5)
HCT: 31.1 % — ABNORMAL LOW (ref 39.0–52.0)
Hemoglobin: 10.2 g/dL — ABNORMAL LOW (ref 13.0–17.0)
LYMPHS ABS: 0.9 10*3/uL (ref 0.7–4.0)
Lymphocytes Relative: 6 % — ABNORMAL LOW (ref 12–46)
MCH: 26 pg (ref 26.0–34.0)
MCHC: 32.8 g/dL (ref 30.0–36.0)
MCV: 79.1 fL (ref 78.0–100.0)
Monocytes Absolute: 1.4 10*3/uL — ABNORMAL HIGH (ref 0.1–1.0)
Monocytes Relative: 10 % (ref 3–12)
Neutro Abs: 11.2 10*3/uL — ABNORMAL HIGH (ref 1.7–7.7)
Neutrophils Relative %: 82 % — ABNORMAL HIGH (ref 43–77)
PLATELETS: 369 10*3/uL (ref 150–400)
RBC: 3.93 MIL/uL — AB (ref 4.22–5.81)
RDW: 18.5 % — ABNORMAL HIGH (ref 11.5–15.5)
WBC: 13.8 10*3/uL — ABNORMAL HIGH (ref 4.0–10.5)

## 2014-08-29 LAB — BASIC METABOLIC PANEL
Anion gap: 12 (ref 5–15)
BUN: 48 mg/dL — ABNORMAL HIGH (ref 6–23)
CO2: 26 mmol/L (ref 19–32)
CREATININE: 1.81 mg/dL — AB (ref 0.50–1.35)
Calcium: 8.5 mg/dL (ref 8.4–10.5)
Chloride: 88 mmol/L — ABNORMAL LOW (ref 96–112)
GFR calc Af Amer: 39 mL/min — ABNORMAL LOW (ref >90)
GFR, EST NON AFRICAN AMERICAN: 34 mL/min — AB (ref >90)
GLUCOSE: 116 mg/dL — AB (ref 70–99)
Potassium: 3.5 mmol/L (ref 3.5–5.1)
Sodium: 126 mmol/L — ABNORMAL LOW (ref 135–145)

## 2014-08-29 LAB — BRAIN NATRIURETIC PEPTIDE: B Natriuretic Peptide: 1894.1 pg/mL — ABNORMAL HIGH (ref 0.0–100.0)

## 2014-08-29 LAB — TROPONIN I
Troponin I: 0.03 ng/mL (ref <0.031)
Troponin I: 0.03 ng/mL (ref <0.031)

## 2014-08-29 LAB — I-STAT TROPONIN, ED: Troponin i, poc: 0.02 ng/mL (ref 0.00–0.08)

## 2014-08-29 LAB — PROTIME-INR
INR: 2.79 — ABNORMAL HIGH (ref 0.00–1.49)
Prothrombin Time: 29.6 seconds — ABNORMAL HIGH (ref 11.6–15.2)

## 2014-08-29 MED ORDER — ASPIRIN 81 MG PO CHEW
324.0000 mg | CHEWABLE_TABLET | Freq: Once | ORAL | Status: AC
  Administered 2014-08-29: 324 mg via ORAL
  Filled 2014-08-29: qty 4

## 2014-08-29 MED ORDER — ONDANSETRON HCL 4 MG/2ML IJ SOLN
4.0000 mg | Freq: Once | INTRAMUSCULAR | Status: AC
  Administered 2014-08-29: 4 mg via INTRAVENOUS
  Filled 2014-08-29: qty 2

## 2014-08-29 MED ORDER — NITROGLYCERIN 0.4 MG SL SUBL: 0.4000 mg | SUBLINGUAL_TABLET | SUBLINGUAL | Status: DC | PRN

## 2014-08-29 MED ORDER — WARFARIN - PHARMACIST DOSING INPATIENT
Freq: Every day | Status: DC
  Administered 2014-08-29: 18:00:00

## 2014-08-29 MED ORDER — TORSEMIDE 20 MG PO TABS
80.0000 mg | ORAL_TABLET | Freq: Every day | ORAL | Status: DC
  Administered 2014-08-29 – 2014-08-31 (×3): 80 mg via ORAL
  Filled 2014-08-29 (×3): qty 4

## 2014-08-29 MED ORDER — MILRINONE IN DEXTROSE 20 MG/100ML IV SOLN
0.5000 ug/kg/min | INTRAVENOUS | Status: DC
  Administered 2014-08-29 – 2014-08-31 (×4): 0.5 ug/kg/min via INTRAVENOUS
  Filled 2014-08-29 (×5): qty 100

## 2014-08-29 MED ORDER — FUROSEMIDE 10 MG/ML IJ SOLN
80.0000 mg | Freq: Once | INTRAMUSCULAR | Status: DC
Start: 2014-08-29 — End: 2014-08-29

## 2014-08-29 MED ORDER — ALPRAZOLAM 0.25 MG PO TABS
0.2500 mg | ORAL_TABLET | Freq: Two times a day (BID) | ORAL | Status: DC | PRN
  Administered 2014-08-30 – 2014-08-31 (×2): 0.25 mg via ORAL
  Filled 2014-08-29 (×2): qty 1

## 2014-08-29 MED ORDER — ENOXAPARIN SODIUM 30 MG/0.3ML ~~LOC~~ SOLN
30.0000 mg | SUBCUTANEOUS | Status: DC
  Administered 2014-08-29: 30 mg via SUBCUTANEOUS
  Filled 2014-08-29 (×2): qty 0.3

## 2014-08-29 MED ORDER — GI COCKTAIL ~~LOC~~
30.0000 mL | Freq: Four times a day (QID) | ORAL | Status: DC | PRN
  Filled 2014-08-29: qty 30

## 2014-08-29 MED ORDER — MORPHINE SULFATE 4 MG/ML IJ SOLN
4.0000 mg | Freq: Once | INTRAMUSCULAR | Status: AC
  Administered 2014-08-29: 4 mg via INTRAVENOUS
  Filled 2014-08-29: qty 1

## 2014-08-29 MED ORDER — SODIUM CHLORIDE 0.9 % IJ SOLN
10.0000 mL | INTRAMUSCULAR | Status: DC | PRN
  Administered 2014-08-30 – 2014-08-31 (×2): 10 mL
  Filled 2014-08-29 (×2): qty 40

## 2014-08-29 MED ORDER — METOCLOPRAMIDE HCL 5 MG PO TABS
5.0000 mg | ORAL_TABLET | Freq: Three times a day (TID) | ORAL | Status: DC
  Administered 2014-08-29 – 2014-08-31 (×7): 5 mg via ORAL
  Filled 2014-08-29 (×2): qty 1
  Filled 2014-08-29: qty 0.5
  Filled 2014-08-29 (×5): qty 1
  Filled 2014-08-29 (×2): qty 0.5
  Filled 2014-08-29: qty 1

## 2014-08-29 MED ORDER — SPIRONOLACTONE 50 MG PO TABS
50.0000 mg | ORAL_TABLET | Freq: Every day | ORAL | Status: DC
  Administered 2014-08-29 – 2014-08-31 (×3): 50 mg via ORAL
  Filled 2014-08-29 (×3): qty 1

## 2014-08-29 MED ORDER — FUROSEMIDE 10 MG/ML IJ SOLN
80.0000 mg | Freq: Once | INTRAMUSCULAR | Status: AC
  Administered 2014-08-29: 80 mg via INTRAVENOUS
  Filled 2014-08-29: qty 8

## 2014-08-29 MED ORDER — AMIODARONE HCL 200 MG PO TABS
200.0000 mg | ORAL_TABLET | Freq: Every day | ORAL | Status: DC
  Administered 2014-08-29 – 2014-08-31 (×3): 200 mg via ORAL
  Filled 2014-08-29 (×3): qty 1

## 2014-08-29 MED ORDER — WARFARIN SODIUM 4 MG PO TABS
4.0000 mg | ORAL_TABLET | Freq: Once | ORAL | Status: AC
  Administered 2014-08-29: 4 mg via ORAL
  Filled 2014-08-29: qty 1

## 2014-08-29 MED ORDER — TRAMADOL HCL 50 MG PO TABS: 50.0000 mg | ORAL_TABLET | Freq: Two times a day (BID) | ORAL | Status: DC | PRN

## 2014-08-29 MED ORDER — SORBITOL 70 % SOLN
30.0000 mL | Freq: Every day | Status: DC
  Administered 2014-08-29 – 2014-08-31 (×3): 30 mL via ORAL
  Filled 2014-08-29 (×3): qty 30

## 2014-08-29 MED ORDER — ONDANSETRON HCL 4 MG/2ML IJ SOLN: 4.0000 mg | Freq: Four times a day (QID) | INTRAMUSCULAR | Status: DC | PRN

## 2014-08-29 MED ORDER — SILDENAFIL CITRATE 20 MG PO TABS
20.0000 mg | ORAL_TABLET | Freq: Three times a day (TID) | ORAL | Status: DC
  Administered 2014-08-29 – 2014-08-31 (×7): 20 mg via ORAL
  Filled 2014-08-29 (×8): qty 1

## 2014-08-29 MED ORDER — ACETAMINOPHEN 325 MG PO TABS: 650.0000 mg | ORAL_TABLET | ORAL | Status: DC | PRN

## 2014-08-29 MED ORDER — MAGNESIUM OXIDE 400 MG PO TABS
400.0000 mg | ORAL_TABLET | Freq: Two times a day (BID) | ORAL | Status: DC
  Administered 2014-08-29 – 2014-08-31 (×5): 400 mg via ORAL
  Filled 2014-08-29 (×6): qty 1

## 2014-08-29 MED ORDER — ZOLPIDEM TARTRATE 5 MG PO TABS: 5.0000 mg | ORAL_TABLET | Freq: Every evening | ORAL | Status: DC | PRN

## 2014-08-29 MED ORDER — POTASSIUM CHLORIDE CRYS ER 20 MEQ PO TBCR
20.0000 meq | EXTENDED_RELEASE_TABLET | Freq: Two times a day (BID) | ORAL | Status: DC
  Administered 2014-08-29 – 2014-08-31 (×5): 20 meq via ORAL
  Filled 2014-08-29 (×5): qty 1

## 2014-08-29 MED ORDER — METOCLOPRAMIDE HCL 10 MG PO TABS
5.0000 mg | ORAL_TABLET | Freq: Three times a day (TID) | ORAL | Status: DC
  Filled 2014-08-29 (×2): qty 0.5

## 2014-08-29 MED ORDER — POTASSIUM CHLORIDE CRYS ER 20 MEQ PO TBCR
40.0000 meq | EXTENDED_RELEASE_TABLET | Freq: Once | ORAL | Status: AC
  Administered 2014-08-29: 40 meq via ORAL
  Filled 2014-08-29: qty 2

## 2014-08-29 MED ORDER — POLYETHYLENE GLYCOL 3350 17 G PO PACK
17.0000 g | PACK | Freq: Every day | ORAL | Status: DC
  Administered 2014-08-29 – 2014-08-31 (×3): 17 g via ORAL
  Filled 2014-08-29 (×3): qty 1

## 2014-08-29 NOTE — ED Provider Notes (Addendum)
CSN: [id number]     Arrival date & time 08/29/14  0354 History   First MD Initiated Contact with Patient 08/29/14 [phone]     Chief Complaint  Patient presents with  . Chest Pain     (Consider location/radiation/quality/duration/timing/severity/associated sxs/prior Treatment) Patient is a 81 y.o. male presenting with chest pain. The history is provided by the patient.  Chest Pain He has a history of nonischemic cardiomyopathy and is maintained on intravenous milrinone. He started having midsternal chest pain tonight with associated dyspnea but no nausea or diaphoresis. He did not take anything to treat his pain. Nothing makes it better nothing makes it worse. He claims compliance with his medications for his heart. He is anticoagulated on warfarin.  Past Medical History  Diagnosis Date  . HTN (hypertension)   . High cholesterol   . CHF (congestive heart failure)   . BBBB (bilateral bundle branch block)   . LBBB (left bundle branch block)   . Seizures 06/11/2012    new onset/notes (06/11/2012)  . SOB (shortness of breath)     "sometimes when I lay down;; related to not taking my RX" (06/11/2012)  . Myocardial infarction     06/10/2012  . Atrial fibrillation   . Stroke   . Atrial thrombus     left  . Pulmonary HTN   . Atrial fibrillation 06/12/2012   Past Surgical History  Procedure Laterality Date  . Throat surgery  1940[birth year]    "and nose" (06/11/2012); ?T&A  . Inguinal hernia repair  ~ September 26, 2006    "both sides" (06/11/2012)  . Tee without cardioversion  06/29/2012    Procedure: TRANSESOPHAGEAL ECHOCARDIOGRAM (TEE);  Surgeon: Jolaine Artist, MD;  Location: [facility] ENDOSCOPY;  Service: Cardiovascular;  Laterality: N/A;  will need a spanish interpreter Interpreter will be here at 1330-Hope spoke w/ [place]  . Cardioversion N/A 08/14/2012    Procedure: CARDIOVERSION;  Surgeon: Jolaine Artist, MD;  Location: [facility] ENDOSCOPY;  Service: Cardiovascular;  Laterality: N/A;  . Tee without  cardioversion N/A 11/12/2013    Procedure: TRANSESOPHAGEAL ECHOCARDIOGRAM (TEE);  Surgeon: Candee Furbish, MD;  Location: [facility] ENDOSCOPY;  Service: Cardiovascular;  Laterality: N/A;  . Tee without cardioversion N/A 11/19/2013    Procedure: TRANSESOPHAGEAL ECHOCARDIOGRAM (TEE);  Surgeon: Jolaine Artist, MD;  Location: [facility] ENDOSCOPY;  Service: Cardiovascular;  Laterality: N/A;  . Cardioversion N/A 12/27/2013    Procedure: CARDIOVERSION;  Surgeon: Jolaine Artist, MD;  Location: [facility] ENDOSCOPY;  Service: Cardiovascular;  Laterality: N/A;  . Left and right heart catheterization with coronary angiogram N/A 07/02/2012    Procedure: LEFT AND RIGHT HEART CATHETERIZATION WITH CORONARY ANGIOGRAM;  Surgeon: Jolaine Artist, MD;  Location: [facility] CATH LAB;  Service: Cardiovascular;  Laterality: N/A;  . Right heart catheterization N/A 07/06/2012    Procedure: RIGHT HEART CATH;  Surgeon: Jolaine Artist, MD;  Location: [facility] CATH LAB;  Service: Cardiovascular;  Laterality: N/A;  . Bi-ventricular implantable cardioverter defibrillator N/A 10/26/2012    Procedure: BI-VENTRICULAR IMPLANTABLE CARDIOVERTER DEFIBRILLATOR  (CRT-D);  Surgeon: Evans Lance, MD;  Location: [facility] CATH LAB;  Service: Cardiovascular;  Laterality: N/A;   Family History  Problem Relation Age of Onset  . Heart attack      father had MI in his 33s, no fhx of early heart problem before age 42  . Heart attack      mother died of MI around 09-26-98   History  Substance Use Topics  . Smoking status: Never Smoker   . Smokeless  tobacco: Never Used  . Alcohol Use: No    Review of Systems  Cardiovascular: Positive for chest pain.  All other systems reviewed and are negative.     Allergies  Lisinopril  Home Medications   Prior to Admission medications   Medication Sig Start Date End Date Taking? Authorizing Provider  acetaminophen (TYLENOL) 325 MG tablet Take 2 tablets (650 mg total) by mouth every 6 (six) hours as needed for mild pain (or Fever  >/= 101). 07/10/14   Kinnie Feil, MD  amiodarone (PACERONE) 200 MG tablet Take 1 tablet (200 mg total) by mouth daily. 07/10/14   Kinnie Feil, MD  magnesium oxide (MAG-OX) 400 MG tablet Take 1 tablet (400 mg total) by mouth 2 (two) times daily. 04/25/14   Jolaine Artist, MD  metoCLOPramide (REGLAN) 10 MG tablet Take 1 tablet (10 mg total) by mouth 2 (two) times daily. 07/31/14   Jolaine Artist, MD  milrinone (PRIMACOR) 20 MG/100ML SOLN infusion Inject 33.95 mcg/min into the vein continuous. 06/10/14   Rande Brunt, NP  polyethylene glycol [facility] / GLYCOLAX) packet Take 17 g by mouth daily. 08/23/14   Isaiah Serge, NP  potassium chloride SA (K-DUR,KLOR-CON) 20 MEQ tablet Take 1 tablet (20 mEq total) by mouth 2 (two) times daily. 07/31/14   Jolaine Artist, MD  sildenafil (REVATIO) 20 MG tablet Take 1 tablet (20 mg total) by mouth 3 (three) times daily. 03/07/14   Rande Brunt, NP  sorbitol 70 % SOLN Take 30 mLs by mouth daily. 07/17/14   Jolaine Artist, MD  spironolactone (ALDACTONE) 25 MG tablet Take 2 tablets (50 mg total) by mouth daily. 07/22/14   Jolaine Artist, MD  torsemide (DEMADEX) 20 MG tablet Take 4 tablets (80 mg total) by mouth daily. 04/25/14   Jolaine Artist, MD  traMADol (ULTRAM) 50 MG tablet Take 1 tablet (50 mg total) by mouth 2 (two) times daily as needed. for pain 07/17/14   Jolaine Artist, MD  warfarin (COUMADIN) 4 MG tablet Take 4 mg Monday, Wednesday, Friday and Sunday. 2 mg on Tues, Thurs and Sat 06/10/14   Rande Brunt, NP   BP 92/71 mmHg  Pulse 89  Temp(Src) 97.7 F (36.5 C) (Oral)  Resp 14  SpO2 93% Physical Exam  Nursing note and vitals reviewed.  81 year old male, resting comfortably and in no acute distress. Vital signs are normal9. Oxygen saturation is 93%, which is normal. Head is normocephalic and atraumatic. PERRLA, EOMI. Oropharynx is clear. Neck is nontender and supple without adenopathy or JVD. Back is nontender and there  is no CVA tenderness. Lungs are clear without rales, wheezes, or rhonchi. Chest is nontender. PICC line is present on the right. Heart has regular rate and rhythm without murmur. Abdomen is soft, flat, nontender without masses or hepatosplenomegaly and peristalsis is normoactive. Extremities have 1+ edema, full range of motion is present. Skin is warm and dry without rash. Neurologic: Mental status is normal, cranial nerves are intact, there are no motor or sensory deficits.  ED Course  Procedures (including critical care time) Labs Review Results for orders placed or performed during the hospital encounter of 95/18/84  Basic metabolic panel  Result Value Ref Range   Sodium 126 (L) 135 - 145 mmol/L   Potassium 3.5 3.5 - 5.1 mmol/L   Chloride 88 (L) 96 - 112 mmol/L   CO2 26 19 - 32 mmol/L   Glucose, Bld 116 (H)  70 - 99 mg/dL   BUN 48 (H) 6 - 23 mg/dL   Creatinine, Ser 1.81 (H) 0.50 - 1.35 mg/dL   Calcium 8.5 8.4 - 10.5 mg/dL   GFR calc non Af Amer 34 (L) >90 mL/min   GFR calc Af Amer 39 (L) >90 mL/min   Anion gap 12 5 - 15  BNP (order ONLY if patient complains of dyspnea/SOB AND you have documented it for THIS visit)  Result Value Ref Range   B Natriuretic Peptide 1894.1 (H) 0.0 - 100.0 pg/mL  Protime-INR (if pt is taking Coumadin)  Result Value Ref Range   Prothrombin Time 29.6 (H) 11.6 - 15.2 seconds   INR 2.79 (H) 0.00 - 1.49  CBC with Differential  Result Value Ref Range   WBC 13.8 (H) 4.0 - 10.5 K/uL   RBC 3.93 (L) 4.22 - 5.81 MIL/uL   Hemoglobin 10.2 (L) 13.0 - 17.0 g/dL   HCT 31.1 (L) 39.0 - 52.0 %   MCV 79.1 78.0 - 100.0 fL   MCH 26.0 26.0 - 34.0 pg   MCHC 32.8 30.0 - 36.0 g/dL   RDW 18.5 (H) 11.5 - 15.5 %   Platelets 369 150 - 400 K/uL   Neutrophils Relative % 82 (H) 43 - 77 %   Neutro Abs 11.2 (H) 1.7 - 7.7 K/uL   Lymphocytes Relative 6 (L) 12 - 46 %   Lymphs Abs 0.9 0.7 - 4.0 K/uL   Monocytes Relative 10 3 - 12 %   Monocytes Absolute 1.4 (H) 0.1 - 1.0 K/uL    Eosinophils Relative 2 0 - 5 %   Eosinophils Absolute 0.3 0.0 - 0.7 K/uL   Basophils Relative 0 0 - 1 %   Basophils Absolute 0.0 0.0 - 0.1 K/uL  I-stat troponin, ED (not at [facility])  Result Value Ref Range   Troponin i, poc 0.02 0.00 - 0.08 ng/mL   Comment 3           Imaging Review Dg Chest Port 1 View  08/29/2014   CLINICAL DATA:  Chest pain.  EXAM: PORTABLE CHEST - 1 VIEW  COMPARISON:  08/19/2014  FINDINGS: Dual lead left-sided pacemaker remains in place. Tip of the right central line in the SVC. Cardiomegaly is unchanged. Pulmonary edema is mildly progressed. No large pleural effusion or pneumothorax. Unchanged osseous structures.  IMPRESSION: Progressive pulmonary edema from prior.  Cardiomegaly is unchanged.   Electronically Signed   By: Jeb Levering M.D.   On: 08/29/2014 04:53     EKG Interpretation   Date/Time:  Friday August 29 2014 04:05:48 EST Ventricular Rate:  86 PR Interval:  167 QRS Duration: 167 QT Interval:  469 QTC Calculation: 561 R Axis:   -163 Text Interpretation:  Atrial-sensed ventricular-paced rhythm No further  analysis attempted due to paced rhythm Baseline wander in lead(s) V1 V2 V3  When compared with ECG of 08/19/2014, No significant change was found  Confirmed by [facility]  MD, Chapel [place]orn ([id number]) on 08/29/2014 4:17:54 AM      MDM   Final diagnoses:  Chest pain, unspecified chest pain type  Acute on chronic combined systolic and diastolic heart failure    Chest pain of uncertain cause. Old records are reviewed and he has had difficult to control heart failure but cardiac catheterization in January 2014 showed no blockages greater than 30%. ECG is paced so I'm unable to make any assessments based on that. He will be given therapeutic trial of nitroglycerin.  With nitroglycerin, there  is slight relief of pain. Chest x-ray appears to show worsening of heart failure although his be in pain is not significantly changed. Case was discussed with his cardiologist,  Dr. Haroldine Laws, who states he is very familiar with the patient. With no significant coronary atherosclerosis, he is at very low risk for ischemic heart pain. Based on chest x-ray, he recommended giving a dose of furosemide and he would be able to follow the patient up in clinic. He is given a dose of furosemide and discharged.  Delora Fuel, MD 09/31/12 1624  Patient states that he cannot go because he is not feeling well. I will ask cardiology to come and evaluate him in person.  Delora Fuel, MD 46/95/07 2257

## 2014-08-29 NOTE — ED Provider Notes (Signed)
Patient signed out by Dr. Roxanne Mins pending cardiology consultation.  Patient with nonischemic cardiomyopathy who presented with chest pain last night. Workup is reassuring. Patient was given Lasix per Dr. Haroldine Laws.  Patient was to be discharged home with close follow-up. However, patient is refusing to leave and states "I feel bad." He states that he will return immediately. For this reason, Dr. Roxanne Mins is requesting formal cardiology evaluation.    1:23 PM Admitted to cardiology  Merryl Hacker, MD 08/29/14 1323

## 2014-08-29 NOTE — ED Notes (Signed)
Suanne Marker PA at bedside

## 2014-08-29 NOTE — H&P (Signed)
Primary MD: Harvie Junior, MD Cardiologist:   Chief Complaint:  HPI:  Tyler Christensen is a 81 y.o. male [name] (non-english speaking) w/ PMHx significant for chronic systolic CHF (EF 75%), NICM, LBBB, PAF (on coumadin), HTN, HLD, CVA, Pulmonary Hypertension and h/o seizure d/o (2/2 CVA 05/2012).   Admitted to [facility] 06/2012 with acute decompensated heart failure EF 20%. As noted below, also diagnosed with severe pulmonary hypertension (PAPs ~ 90) from initial RHC and placed on Milrinone and Revatio (eventually transitioned to tadalafil 40) with marked clinical response and decrease in PA pressures. VQ low probability for pulmonary embolus. Discharged on Milrinone at 0.375 mcg via PICC. Discharge Weight 146 pounds. Attempted to wean milrinone in 9/14 but failed due to worsening HF and PAH. Milrinone restarted.   Admitted to the hospital 5/21-11/22/2013. Found to have staph aureus bacteremia treated with IV abx. TEE negative for endocarditis. Also found to retroperitoneal mass and biopsy revealed benign lesion. He had evidence of low output thought to be due to atrial fibrillation and milrinone increased. Attempted TEE and DC-CV but had LAA clot so deferred cardioversion.   He was admitted in 12/15 with acute on chronic systolic CHF. He was diuresed with IV Lasix and milrinone was increased to 0.5 mcg/kg/min.   Admitted 1/15 with abdominal distension and pain. Thought to be due to constipation. 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 HF was stable with normal co-ox during admit. Treated with sorbitol.   Admitted again for volume overload and ab pain in 07/2014. Diuresed and given sorbitol. Discharge weight 140 lbs  Admitted 3/1-08/23/2014 for heart failure, discharge weight 135 pounds.  Seen in CHF clinic 08/28/2014. HHRN was concerned and said he was not taking medicines routinely. He refused hospice. His weight was 140 lb, 6.4  ounces. He is a DO NOT RESUSCITATE but not interested in palliative care or hospice. The plan was to give him Lasix 80 mg IV once a week on Wednesdays with potassium to help maintain volume status. Not on ace/ARB due to renal function. Not on beta blockers due to low output state and milrinone. Plan was for him to get TED hose placed, apply for a motorized scooter, and consideration is to be given for starting Reglan 10 mg twice a day for GI issues. He is also to get a referral to GI for a gastric emptying study.  He developed chest pain and shortness of breath and came to the emergency room. Currently he is also complaining of nausea. His BNP was elevated and CXR with edema. He was given Lasix 80 mg IV at 6 am, and was for another 80 mg at 10 am. The second dose was held due to [name] 92.   Tyler Christensen states (through interpreter) that the reason he came to the hospital was chest pain, which started last pm. It has improved, but is still there. He has been gagging, but  Review of Systems:     Cardiac Review of Systems: {Y] = yes [ ]  = no  Chest Pain [  y  ]  Resting SOB [ ]  Exertional SOB  Blue.Reese  ]  Orthopnea [ y ]   Pedal Edema [   ]    Palpitations [  ] Syncope  [  ]   Presyncope [   ]  General Review of Systems: [Y] = yes [  ]=no Constitional: recent weight change [  ]; anorexia [  ];  fatigue [  ]; nausea [ y ]; night sweats [  ]; fever [  ]; or chills [  ];                                                                                                                                          Dental: poor dentition[ y ];  Eye : blurred vision [  ]; diplopia [   ]; vision changes [  ];  Amaurosis fugax[  ]; Resp: cough [  ];  wheezing[  ];  hemoptysis[  ]; shortness of breath[ y ]; paroxysmal nocturnal dyspnea[  ]; dyspnea on exertion[  ]; or orthopnea[  ];  GI:  gallstones[  ], vomiting[  ];  dysphagia[  ]; melena[  ];  hematochezia [  ]; heartburn[  ];   Hx of  Colonoscopy[  ]; GU: kidney stones [  ];  hematuria[  ];   dysuria [  ];  nocturia[  ];  history of     obstruction [  ];                 Skin: rash, swelling[  ];, hair loss[  ];  peripheral edema[  ];  or itching[  ]; Musculosketetal: myalgias[  ];  joint swelling[  ];  joint erythema[  ];  joint pain[  ];  back pain[  ];  Heme/Lymph: bruising[  ];  bleeding[  ];  anemia[  ];  Neuro: TIA[  ];  headaches[  ];  stroke[  ];  vertigo[  ];  seizures[  ];   paresthesias[  ];  difficulty walking[  ];  Psych:depression[  ]; anxiety[  ];  Endocrine: diabetes[  ];  thyroid dysfunction[  ];  Immunizations: Flu [  ]; Pneumococcal[  ];  Other:  Past Medical History  Diagnosis Date  . HTN (hypertension)   . High cholesterol   . CHF (congestive heart failure)   . BBBB (bilateral bundle branch block)   . LBBB (left bundle branch block)   . Seizures 06/11/2012    new onset/notes (06/11/2012)  . SOB (shortness of breath)     "sometimes when I lay down;; related to not taking my RX" (06/11/2012)  . Myocardial infarction     06/10/2012  . Atrial fibrillation   . Stroke   . Atrial thrombus     left  . Pulmonary HTN   . Atrial fibrillation 06/12/2012   Past Surgical History  Procedure Laterality Date  . Throat surgery  1942    "and nose" (06/11/2012); ?T&A  . Inguinal hernia repair  ~ 2008    "both sides" (06/11/2012)  . Tee without cardioversion  06/29/2012    Procedure: TRANSESOPHAGEAL ECHOCARDIOGRAM (TEE);  Surgeon: Jolaine Artist, MD;  Location: [facility] ENDOSCOPY;   . Cardioversion N/A 08/14/2012    Procedure: CARDIOVERSION;  Surgeon: Jolaine Artist, MD;  Location: [facility] ENDOSCOPY;  . Tee without cardioversion N/A 11/12/2013    Procedure: TRANSESOPHAGEAL ECHOCARDIOGRAM (TEE);  Surgeon: Candee Furbish, MD;  Location: [facility] ENDOSCOPY;   . Tee without cardioversion N/A 11/19/2013    Procedure: TRANSESOPHAGEAL ECHOCARDIOGRAM (TEE);  Surgeon: Jolaine Artist, MD;  Location: [facility] ENDOSCOPY;   . Cardioversion N/A 12/27/2013    Procedure:  CARDIOVERSION;  Surgeon: Jolaine Artist, MD;  Location: [facility] ENDOSCOPY;   . Left and right heart catheterization with coronary angiogram N/A 07/02/2012    Procedure: LEFT AND RIGHT HEART CATHETERIZATION WITH CORONARY ANGIOGRAM;  Surgeon: Jolaine Artist, MD;  Location: [facility] CATH LAB; Mild non-obstructive CAD, severe PAH, PA = 93/42 (59), severe NICM w/ EF 15%  . Right heart catheterization N/A 07/06/2012    Procedure: RIGHT HEART CATH;  Surgeon: Jolaine Artist, MD; to assess response to milrinone; Moderate PH with profound reduction in PVR with milrinone/PDE-5 inhibitor   . Bi-ventricular implantable cardioverter defibrillator N/A 10/26/2012    Procedure: BI-VENTRICULAR IMPLANTABLE CARDIOVERTER DEFIBRILLATOR  (CRT-D);  Surgeon: Evans Lance, MD;  Medtronic Evera XT CRTD, BiV ICD serial #[id number]     Prior to Admission medications   Medication Sig Start Date End Date Taking? Authorizing Provider  acetaminophen (TYLENOL) 325 MG tablet Take 2 tablets (650 mg total) by mouth every 6 (six) hours as needed for mild pain (or Fever >/= 101). 07/10/14  Yes Kinnie Feil, MD  amiodarone (PACERONE) 200 MG tablet Take 1 tablet (200 mg total) by mouth daily. 07/10/14  Yes Kinnie Feil, MD  magnesium oxide (MAG-OX) 400 MG tablet Take 1 tablet (400 mg total) by mouth 2 (two) times daily. 04/25/14  Yes Jolaine Artist, MD  metoCLOPramide (REGLAN) 10 MG tablet Take 1 tablet (10 mg total) by mouth 2 (two) times daily. 07/31/14  Yes Shaune Pascal Dawon Troop, MD  milrinone [facility]) 20 MG/100ML SOLN infusion Inject 33.95 mcg/min into the vein continuous. 06/10/14  Yes Rande Brunt, NP  polyethylene glycol (MIRALAX / GLYCOLAX) packet Take 17 g by mouth daily. 08/23/14  Yes Isaiah Serge, NP  potassium chloride SA (K-DUR,KLOR-CON) 20 MEQ tablet Take 1 tablet (20 mEq total) by mouth 2 (two) times daily. 07/31/14  Yes Jolaine Artist, MD  sildenafil (REVATIO) 20 MG tablet Take 1 tablet (20 mg total) by mouth 3  (three) times daily. 03/07/14  Yes Rande Brunt, NP  sorbitol 70 % SOLN Take 30 mLs by mouth daily. 07/17/14  Yes Jolaine Artist, MD  spironolactone (ALDACTONE) 25 MG tablet Take 2 tablets (50 mg total) by mouth daily. 07/22/14  Yes Jolaine Artist, MD  torsemide (DEMADEX) 20 MG tablet Take 4 tablets (80 mg total) by mouth daily. 04/25/14  Yes Jolaine Artist, MD  traMADol (ULTRAM) 50 MG tablet Take 1 tablet (50 mg total) by mouth 2 (two) times daily as needed. for pain 07/17/14  Yes Jolaine Artist, MD  warfarin (COUMADIN) 4 MG tablet Take 4 mg Monday, Wednesday, Friday and Sunday. 2 mg on Cain Saupe and Sat 06/10/14  Yes Rande Brunt, NP    Allergies  Allergen Reactions  . Lisinopril Rash    History   Social History  . Marital Status: Divorced    Spouse Name: N/A  . Number of Children: N/A  . Years of Education: N/A   Occupational History  . Retired    Social History Main Topics  . Smoking status: Never Smoker   . Smokeless tobacco: Never Used  .  Alcohol Use: No  . Drug Use: No  . Sexual Activity: No   Other Topics Concern  . Not on file   Social History Narrative   [name], Spanish only (few words of English). Lives with wife.    Family History  Problem Relation Age of Onset  . Heart attack      father had MI in his 4s, no fhx of early heart problem before age 48  . Heart attack      mother died of MI around 33   Family Status  Relation Status Death Age  . Mother Deceased   . Father Deceased     PHYSICAL EXAM: Filed Vitals:   08/29/14 1015  BP: 94/56  Pulse: 77  Temp:   Resp: 22   General:  Well appearing. Moderate respiratory difficulty HEENT: normal Neck: supple. JVD 8-9 cm. + hepatojugular reflux. Carotids 2+ bilat; no bruits. No lymphadenopathy or thryomegaly appreciated. Cor: PMI nondisplaced. Regular rate & rhythm. No rubs. S3 gallop noted, no murmurs. Lungs: decreased BS bases w/ some rales Abdomen: soft, nontender,  nondistended. 1" hepatosplenomegaly. No bruits or masses. Good bowel sounds. Extremities: no cyanosis, clubbing, rash, edema Neuro: alert & oriented x 3, cranial nerves grossly intact. moves all 4 extremities w/o difficulty. Affect pleasant.  ECG: 08/29/2014 SR, vent pacing  Results for orders placed or performed during the hospital encounter of 08/29/14 (from the past 24 hour(s))  Basic metabolic panel     Status: Abnormal   Collection Time: 08/29/14  4:29 AM  Result Value Ref Range   Sodium 126 (L) 135 - 145 mmol/L   Potassium 3.5 3.5 - 5.1 mmol/L   Chloride 88 (L) 96 - 112 mmol/L   CO2 26 19 - 32 mmol/L   Glucose, Bld 116 (H) 70 - 99 mg/dL   BUN 48 (H) 6 - 23 mg/dL   Creatinine, Ser 1.81 (H) 0.50 - 1.35 mg/dL   Calcium 8.5 8.4 - 10.5 mg/dL   GFR calc non Af Amer 34 (L) >90 mL/min   GFR calc Af Amer 39 (L) >90 mL/min   Anion gap 12 5 - 15  BNP (order ONLY if patient complains of dyspnea/SOB AND you have documented it for THIS visit)     Status: Abnormal   Collection Time: 08/29/14  4:29 AM  Result Value Ref Range   B Natriuretic Peptide 1894.1 (H) 0.0 - 100.0 pg/mL  Protime-INR (if pt is taking Coumadin)     Status: Abnormal   Collection Time: 08/29/14  4:29 AM  Result Value Ref Range   Prothrombin Time 29.6 (H) 11.6 - 15.2 seconds   INR 2.79 (H) 0.00 - 1.49  CBC with Differential     Status: Abnormal   Collection Time: 08/29/14  4:29 AM  Result Value Ref Range   WBC 13.8 (H) 4.0 - 10.5 K/uL   RBC 3.93 (L) 4.22 - 5.81 MIL/uL   Hemoglobin 10.2 (L) 13.0 - 17.0 g/dL   HCT 31.1 (L) 39.0 - 52.0 %   MCV 79.1 78.0 - 100.0 fL   MCH 26.0 26.0 - 34.0 pg   MCHC 32.8 30.0 - 36.0 g/dL   RDW 18.5 (H) 11.5 - 15.5 %   Platelets 369 150 - 400 K/uL   Neutrophils Relative % 82 (H) 43 - 77 %   Neutro Abs 11.2 (H) 1.7 - 7.7 K/uL   Lymphocytes Relative 6 (L) 12 - 46 %   Lymphs Abs 0.9 0.7 - 4.0 K/uL  Monocytes Relative 10 3 - 12 %   Monocytes Absolute 1.4 (H) 0.1 - 1.0 K/uL    Eosinophils Relative 2 0 - 5 %   Eosinophils Absolute 0.3 0.0 - 0.7 K/uL   Basophils Relative 0 0 - 1 %   Basophils Absolute 0.0 0.0 - 0.1 K/uL  I-stat troponin, ED (not at [facility])     Status: None   Collection Time: 08/29/14  4:36 AM  Result Value Ref Range   Troponin i, poc 0.02 0.00 - 0.08 ng/mL   Comment 3           Dg Chest Port 1 View 08/29/2014   CLINICAL DATA:  Chest pain.  EXAM: PORTABLE CHEST - 1 VIEW  COMPARISON:  08/19/2014  FINDINGS: Dual lead left-sided pacemaker remains in place. Tip of the right central line in the SVC. Cardiomegaly is unchanged. Pulmonary edema is mildly progressed. No large pleural effusion or pneumothorax. Unchanged osseous structures.  IMPRESSION: Progressive pulmonary edema from prior.  Cardiomegaly is unchanged.   Electronically Signed   By: Jeb Levering M.D.   On: 08/29/2014 04:53   ASSESSMENT: 1. Chest pain 2. Nausea and vomiting 3. Chronic systolic CHF on milrinone 4. Decreased compliance w/ meds due to weakness and poor memory issues. 5. General malaise, possibly adult FTT 6. PAH 7. Retroperitoneal liposarcoma  PLAN/DISCUSSION:  Tyler. Balandran has end-stage CHF and PAH. His condition is deteriorating, despite frequent office visits. The intervals between hospitalizations are decreasing, and he feels bad all the time. Discussion was had with the patient regarding SNF placement vs home care. He prefers home care, but says he feels bad so he will probably come back to the ER today if we discharge him. It is unclear how much of this ER visit is from nausea, so we can give him scheduled medications for the nausea and see if this helps him keep down liquids. Hopefully discharge in a.m. Possibly his family can be involved in the discussion as he needs care during the day that is currently not available to him.  Rosaria Ferries, PA-C 08/29/2014 12:11 PM Beeper [phone]  Patient seen and examined with Rosaria Ferries, PA-C. We discussed all aspects of the  encounter. I agree with the assessment and plan as stated above.   Discussions had through Montez Morita - our HF pharmacist who is closely familiar with his care. He continues to deteriorate from end-stage HF despite high-dose milrinone and HHRN support. I have again recommended SNF or Hospice but he refuses. I thus suggested having him going home today and f/u with me next week as there is little else we have to offer at this point. He feels he is too weak to go home. Will bring in for observation and continue discussions about SNF/Hospice. Resume home meds. Start Regla and PRN zofran.   Riah Kehoe,MD 12:50 PM

## 2014-08-29 NOTE — Discharge Instructions (Signed)
Dolor de pecho (no especfico) (Chest Pain (Nonspecific)) Con frecuencia es difcil dar un diagnstico especfico de la causa del dolor de [place]. Siempre hay una posibilidad de que el dolor podra estar relacionado con algo grave, como un ataque al corazn o un cogulo sanguneo en los pulmones. Debe someterse a controles con el mdico para ms evaluaciones. CAUSAS   Acidez.  Neumona o bronquitis.  Ansiedad o estrs.  Inflamacin de la zona que rodea al corazn (pericarditis) o a los pulmones (pleuritis o pleuresa).  Un cogulo sanguneo en el pulmn.  Colapso de un pulmn (neumotrax), que puede aparecer de [employer] repentina por s solo (neumotrax espontneo) o debido a un traumatismo en el trax.  Culebrilla (virus del herpes zster). La pared torcica est compuesta por huesos, msculos y Database administrator. Cualquiera de estos puede ser la fuente del dolor.  Puede haber una contusin en los huesos debido a una lesin.  Puede haber un esguince en los msculos o el cartlago ocasionado por la tos o por [place].  El cartlago puede verse afectado por una inflamacin y Engineer, production (costocondritis). DIAGNSTICO  Ileene Hutchinson se necesiten anlisis de laboratorio u otros estudios para Animator causa del Social research officer, government. Adems, puede indicarle que se haga una prueba llamada electrocadiograma (ECG) ambulatorio. El ECG registra los patrones de los latidos cardacos durante 24horas. Adems, pueden hacerle otros estudios, por ejemplo:  Ecocardiograma transtorcico (ETT). Durante IT trainer, se usan ondas sonoras para evaluar el flujo de la sangre a travs del corazn.  Ecocardiograma transesofgico (ETE).  Monitoreo cardaco. Permite que el mdico controle la frecuencia y el ritmo cardaco en tiempo real.  Monitor Holter. Es un dispositivo porttil que [employer] latidos cardacos y [place] a Retail buyer las arritmias cardacas. Le permite al [employer] registrar la actividad [place], si es necesario.  Pruebas de estrs por ejercicio o por medicamentos que aceleran los latidos cardacos. TRATAMIENTO   El tratamiento depende de la causa del dolor de [place]. El tratamiento puede incluir:  Inhibidores de la acidez estomacal.  Antiinflamatorios.  Analgsicos para las enfermedades inflamatorias.  Antibiticos, si hay una infeccin.  Podrn aconsejarle que modifique su estilo de vida. Esto incluye dejar de fumar y evitar el alcohol, la cafena y el chocolate.  Pueden aconsejarle que mantenga la cabeza levantada (elevada) cuando duerme. Esto reduce la probabilidad de que el cido retroceda del estmago al esfago. En la [employer], el dolor de pecho no especfico mejorar en el trmino de 2 a 3das, con reposo y [employer].  INSTRUCCIONES PARA EL CUIDADO EN EL HOGAR   Si le prescriben antibiticos, tmelos tal como se le indic. Termnelos aunque comience a sentirse mejor.  [address] no haga actividades fsicas que provoquen dolor de [place]. Contine con las actividades fsicas tal como se le indic  No consuma ningn producto que contenga tabaco, incluidos cigarrillos, tabaco de Higher education careers adviser o cigarrillos electrnicos.  Evite el consumo de alcohol.  Tome los medicamentos solamente como se lo haya indicado el mdico.  Siga las sugerencias del mdico en lo que respecta a las pruebas adicionales, si el dolor de pecho no desaparece.  Concurra a todas las visitas de control programadas. Si no lo hace, podra desarrollar problemas permanentes (crnicos) relacionados con el dolor. Si hay algn problema para concurrir a una cita, llame para reprogramarla. SOLICITE ATENCIN MDICA SI:   El dolor de pecho no desaparece, incluso despus del tratamiento.  Tiene una erupcin cutnea con ampollas en el  pecho.  Jaclynn Guarneri. SOLICITE ATENCIN MDICA DE [facility] SI:   Aumenta el dolor de pecho o este se irradia hacia el  brazo, el cuello, la [place], la espalda o el abdomen.  Le falta el aire.  La tos empeora, o expectora sangre.  Siente dolor intenso en la espalda o el abdomen.  Se siente nauseoso o vomita.  Siente debilidad intensa.  Se desmaya.  Tiene escalofros. Esto es Engineer, maintenance (IT). No espere a ver si el dolor se pasa. Obtenga ayuda mdica de inmediato. Llame a los servicios de emergencia locales (911 en [place]). No conduzca por sus propios medios [employer] hospital. ASEGRESE DE QUE:   Comprende estas instrucciones.  Controlar su afeccin.  Recibir ayuda de inmediato si no mejora o si empeora. Document Released: 06/06/2005 Document Revised: 06/11/2013 [facility] Patient Information 2015 [place]. This information is not intended to replace advice given to you by your health care provider. Make sure you discuss any questions you have with your health care provider.  Insuficiencia cardaca (Heart Failure) La insuficiencia cardaca es una afeccin en la que el corazn tiene dificultad para bombear la [place]. El corazn no bombea sangre de [place] eficiente para que el organismo pueda funcionar bien. En algunos casos de insuficiencia cardaca, los lquidos vuelven a los pulmones, o puede haber hinchazn (edema) en la zona inferior de las piernas. La insuficiencia cardaca por lo general es una enfermedad de larga duracin (crnica). Es importante que se cuide mucho y que siga el plan de tratamiento que le indique su mdico. CAUSAS  Algunas enfermedades y condiciones pueden causar insuficiencia cardaca. Estas incluyen lo siguiente:  Presin arterial elevada (hipertensin). La hipertensin hace que el msculo cardaco trabaje ms de lo normal. Cuando la presin en los vasos sanguneos es alta, el corazn tiene que bombear (contraerse) con ms fuerza para Armed forces technical officer la sangre por todo el cuerpo. La hipertensin arterial hace que con el tiempo el corazn se vuelva rgido y  dbil.  Enfermedad arterial coronaria (EAC). La enfermedad arterial coronaria es la acumulacin de colesterol y grasa (placa) en las arterias del corazn. La obstruccin de las arterias priva al msculo cardaco de sangre y oxgeno. Esto ocasiona dolor en el pecho y puede conducir a un infarto. La hipertensin arterial tambin favorece la enfermedad arterial coronaria.  Ataque cardaco (infarto de miocardio). El ataque cardaco se produce cuando se obstruye una o ms arterias del corazn. La prdida de oxgeno daa el tejido muscular cardaco. Cuando esto ocurre, una parte del msculo cardaco muere. El tejido daado no se contrae bien y Public house manager la capacidad del corazn para Chiropodist.  Vlvulas cardacas anormales. Cuando las vlvulas cardacas no se abren y cierran como corresponde, pueden causar insuficiencia cardaca. Esto hace que el msculo cardaco tenga que bombear con ms intensidad para que la [place].  Enfermedad del msculo cardaco (miocardiopata o miocarditis). En esta enfermedad, el msculo cardaco est daado por diversas causas. Entre ellas se encuentran el consumo de alcohol o drogas, las infecciones o pueden ser causas desconocidas. Todas estas causas aumentan el riesgo de insuficiencia cardaca.  Enfermedad pulmonar. Enfermedad pulmonar que hace que el corazn se esfuerce ms porque los pulmones no funcionan correctamente. Esto puede hacer que el corazn se tensione, y causar insuficiencia.  Diabetes. La diabetes aumenta el riesgo de insuficiencia cardaca. El nivel elevado de azcar en la sangre contribuye a aumentar los [place] de grasas (lpidos) en la [place]. La diabetes tambin favorece que lentamente se daen los  pequeos vasos sanguneos que transportan nutrientes importantes al el msculo cardaco. Cuando el corazn no obtiene oxgeno y alimento suficiente, se debilita y se torna rgido. Por lo tanto no se contrae de Set designer.  Hay otras enfermedades y  condiciones que pueden contribuir a la insuficiencia cardaca. Entre ellas se incluyen el ritmo cardaco anormal, los problemas de tiroides y los recuentos bajos de glbulos rojos (anemia). Determinadas conductas perjudiciales aumentan el riesgo de insuficiencia cardaca, por ejemplo:  Tener sobrepeso.  Fumar o mascar tabaco.  Consumir alimentos ricos en grasas y colesterol.  Abusar de las drogas ilcitas o del alcohol.  La falta de actividad fsica. SNTOMAS  Los sntomas pueden variar y ser difciles de Hydrographic surveyor. Los sntomas pueden incluir:  Falta de aire al realizar actividades como subir escaleras.  Tos persistente.  Hinchazn de los pies, tobillos, piernas o abdomen.  Aumento de peso sin motivo.  Dificultad para respirar al estar acostado (ortopnea).  Despertarse con la necesidad de sentarse y tomar aire.  Latidos cardacos rpidos.  Fatiga y prdida de Teacher, early years/pre.  Mareos, o sensacin de desmayo o desvanecimiento.  Prdida del apetito.  Nuseas.  Orina con ms frecuencia durante la noche (nocturia). DIAGNSTICO  El diagnstico se realiza segn la historia clnica, los sntomas, el examen fsico y las pruebas diagnsticas. Las pruebas diagnsticas son:  Lawyer.  Electrocardiograma.  Radiografa de trax.  Anlisis de [place].  Prueba de esfuerzo.  Angiografa cardaca.  Gammagrafa. TRATAMIENTO  El tratamiento est destinado al control de los sntomas. Podr ser necesario que tome medicamentos, realice cambios en su estilo de vida o se someta a una intervencin [place] para tratar la insuficiencia cardaca.  Los medicamentos para el tratamiento de la insuficiencia cardaca son:  Inhibidores de la enzima convertidora de angiotensina (IECA). Este tipo de [employer] bloquea los efectos de una protena llamada enzima convertidora de angiotensina. Los inhibidores de la ECA Engineer, agricultural (dilatan) los vasos sanguneos y ayudan a Equities trader la presin  arterial.  Bloqueadores de los receptores de angiotensina [place]). Este tipo de medicamento bloquea las acciones de una protena de la sangre llamada angiotensina. Los bloqueadores de los receptores de angiotensina dilatan los vasos sanguneos y [place] a reducir la presin arterial.  Medicamentos para eliminar los lquidos (diurticos). Los diurticos [employer]anto Company los riones eliminen la sal y el agua de la [place]. El lquido en exceso es eliminado con la [place]. Esta eliminacin del exceso de lquido disminuye el volumen de sangre que el corazn bombea.  Betabloqueantes. Los betabloqueantes evitan que el corazn palpite demasiado rpido y mejoran la fuerza del msculo cardaco.  Digitlicos. Aumentan la fuerza de los latidos cardacos.  Los [employer] Energy un estilo de vida saludable incluyen:  Science writer y Theatre manager un peso saludable.  Dejar de fumar o mascar tabaco.  Consumir alimentos cardiosaludables.  Limitar o evitar el alcohol.  Dejar de consumir drogas ilcitas.  Hacer actividad fsica segn las indicaciones de su mdico.  Los tratamientos quirrgicos para la insuficiencia cardaca pueden ser:  Un procedimiento para abrir las arterias obstruidas, reparacin de vlvulas cardacas daadas o la extirpacin del tejido muscular cardaco daado.  La colocacin de un marcapasos para ayudar al funcionamiento del msculo cardaco y para controlar ciertos ritmos cardacos anormales.  Un desfibrilador cardioversor interno para tratar determinados ritmos cardacos graves anormales.  Un dispositivo de asistencia ventricular izquierda (DAVI) para ayudar a que el corazn [employer]. Nahunta los medicamentos solamente como se lo haya indicado el  mdico. Los medicamentos son importantes para reducir la carga de trabajo del corazn, disminuir la progresin de la insuficiencia cardaca y Teacher, English as a foreign language los sntomas.  No deje de tomar los medicamentos, excepto si  se lo indica su mdico.  No se saltee ninguna dosis de los medicamentos.  Pida una nueva receta antes de quedarse sin medicamentos. Es necesario que tome sus medicamentos todos [place].  Haga actividad fsica moderada si se lo indica su mdico. La actividad fsica moderada puede beneficiar a Psychologist, clinical. Los ancianos y las personas con insuficiencia cardaca grave deben consultarle a su mdico qu actividades fsicas son recomendables para ellos.  Consuma alimentos cardiosaludables. Los alimentos no deben incluir grasas trans y deben tener bajo contenido de grasas saturadas, colesterol y sal (sodio). Las opciones saludables incluyen frutas frescas o congeladas y verduras, pescado, carnes magras, legumbres, productos lcteos descremados o bajos en grasa y cereales integrales o alimentos con alto contenido de [place]. Hable con un nutricionista para aprender ms sobre los alimentos cardiosaludables.  Limite el sodio si se lo indica su mdico. La restriccin de sodio puede reducir los sntomas de insuficiencia cardaca en [employer]. Hable con un nutricionista para aprender ms sobre los condimentos cardiosaludables.  Use mtodos de coccin saludables. Los mtodos de coccin saludables incluyen asar, Interior and spatial designer, hervir, Teacher, music, cocer al vapor o IT consultant. Hable con un nutricionista para aprender ms acerca de los mtodos de coccin saludables.  Limite los lquidos si se lo indica su mdico. La restriccin de lquidos puede reducir los sntomas de insuficiencia cardaca en [employer].  Controle su peso a diario. Es importante que se pese todos los das para reconocer anticipadamente cuando hay exceso de lquido. Hgalo todas las maanas despus de orinar y antes de Merchandiser, retail. Use la misma ropa cada vez que se pese. Anote su [employer]ning [employer]. Lleve a su mdico el registro de su peso.  Controle y registre su presin arterial si se lo indica su mdico.  Contrlese el pulso si se lo indica  su mdico.  Pierda peso si se lo indica su mdico. La prdida de peso puede reducir los sntomas de insuficiencia cardaca en Psychologist, clinical.  Deje de fumar o mascar tabaco. La nicotina hace que el corazn trabaje ms y [employer] vasos sanguneos. No utilice chicles ni parches de nicotina antes de hablar con su mdico.  Concurra a todas las visitas de control como se lo haya indicado el mdico. Esto es importante.  Limite el consumo de alcohol a no ms de 1 medida por da en las mujeres no embarazadas y 2 medidas en los hombres. Una medida equivale a 12onzas de cerveza, 5onzas de vino o 1onzas de bebidas alcohlicas de alta graduacin. Beber ms puede ser daino para el corazn. Informe a su mdico si bebe alcohol varias veces a la semana. Hable con su mdico acerca de si el alcohol es seguro para usted. Si el corazn ya ha sufrido un dao por el alcohol o sufre una insuficiencia cardaca grave, debe dejar de consumirlo completamente.  Deje de consumir drogas ilcitas.  [place]. Esto es especialmente importante prevenir las infecciones respiratorias con vacunas actualizadas contra el neumococo y la gripe.  Controle otros problemas de [place], como hipertensin, diabetes, enfermedad de la tiroides o ritmos cardacos anormales segn las indicaciones de su mdico.  Aprenda a Engineer, maintenance (IT).  Planifique perodos de descanso cuando se sienta fatigado.  Aprenda estrategias para manejar las altas temperaturas. Si el clima es  extremadamente caluroso:  Evite la actividad fsica intensa.  Use el aire acondicionado o los ventiladores o busque un lugar ms fresco.  Evite la cafena y el alcohol.  Use ropa holgada, ligera y de colores claros.  Aprenda estrategias para manejar el fro. Si el clima es extremadamente fro:  Evite la actividad fsica intensa.  Vstase con varias capas de ropa.  Use mitones o guantes, un sombrero y [place] bufanda cuando  salga.  Evite el alcohol.  Reciba asesoramiento y 17 si lo necesita.  Busque programas de rehabilitacin y participe en ellos para mantener o mejorar su independencia y su calidad de vida. SOLICITE ATENCIN MDICA SI:   Aumenta de peso, 3 libras (1,4 kg) en un da o 5 libras (2,3 Kg) en una semana.  Nota que le falta el aire y esto no es habitual en usted.  No puede participar en sus actividades fsicas habituales.  Se cansa con facilidad.  Tose ms de lo normal, especialmente al realizar actividad fsica.  Observa que sus manos, pies, tobillos o abdomen se le hinchan o estn ms hinchados que lo habitual.  Le cuesta dormir debido a que Film/video editor.  Siente que el corazn late rpido (palpitaciones).  Siente mareos o sensacin de desvanecimiento al ponerse de pie. SOLICITE ATENCIN MDICA DE INMEDIATO SI:   Tiene dificultad para respirar.  Hay una modificacin en el estado mental, como disminucin de la lucidez o dificultad para concentrarse.  Siente dolor o Adult nurse.  Se desmaya una vez (sncope). ASEGRESE DE QUE:   Comprende estas instrucciones.  Controlar su afeccin.  Recibir ayuda de inmediato si no mejora o si empeora. Document Released: 06/06/2005 Document Revised: 10/21/2013 [facility] Patient Information 2015 [place], [place]. This information is not intended to replace advice given to you by your health care provider. Make sure you discuss any questions you have with your health care provider.

## 2014-08-29 NOTE — ED Notes (Signed)
Dr Roxanne Mins made aware that pt has not urinated yet. States that it is ok to discharge pt.

## 2014-08-29 NOTE — ED Notes (Signed)
Chest xray at the bedside

## 2014-08-29 NOTE — ED Notes (Signed)
Pt here for chest pain, rates 7/10. Pt is heart failure  And on milrinone

## 2014-08-29 NOTE — ED Notes (Signed)
Called Dr. Roxanne Mins and discussed lasix and morphine ordered with low bp. Reviewed labs as well. MD acknowledges and allows administration of both medications.

## 2014-08-29 NOTE — ED Notes (Signed)
Per interpreter phone pt stated that he became short of breath last night around 1900 while lying in bed. Pt stated that he became increasingly short of breath prompting him to come to the hospital. Pt denies taking any medication. Pt stated that he became slightly nauseous but it went away quickly. Pt states that he is having chest pain 7/10 at this time and still feels short of breath.  Pt states he is not as concerned about his chest pain as he is about being short of breath.

## 2014-08-29 NOTE — ED Notes (Signed)
Pt states that he does not want to be discharged and that if he leaves he will come right back. States that does not feel well and is still having "alittle pain". Interpreter phone was used

## 2014-08-29 NOTE — Progress Notes (Signed)
ANTICOAGULATION CONSULT NOTE - Initial Consult  Pharmacy Consult for warfarin Indication: atrial fibrillation  Allergies  Allergen Reactions  . Lisinopril Rash    Patient Measurements:     Vital Signs: Temp: 97.7 F (36.5 C) (03/11 0407) Temp Source: Oral (03/11 0407) BP: 94/56 mmHg (03/11 1015) Pulse Rate: 77 (03/11 1015)  Labs:  Recent Labs  08/29/14 0429  HGB 10.2*  HCT 31.1*  PLT 369  LABPROT 29.6*  INR 2.79*  CREATININE 1.81*    Estimated Creatinine Clearance: 29.8 mL/min (by C-G formula based on Cr of 1.81).   Medical History: Past Medical History  Diagnosis Date  . HTN (hypertension)   . High cholesterol   . CHF (congestive heart failure)   . BBBB (bilateral bundle branch block)   . LBBB (left bundle branch block)   . Seizures 06/11/2012    new onset/notes (06/11/2012)  . SOB (shortness of breath)     "sometimes when I lay down;; related to not taking my RX" (06/11/2012)  . Myocardial infarction     06/10/2012  . Atrial fibrillation   . Stroke   . Atrial thrombus     left  . Pulmonary HTN   . Atrial fibrillation 06/12/2012    Medications:   (Not in a hospital admission)  Assessment: 81 yo M admitted 08/29/2014 with persistent nausea and vomiting.  Pharmacy consulted to continue warfarin for atrial fibrillation.  PMH: HF 20% (dry weight 135 lb), Afib, HTN, HLD, CVA, pulm HTN, h/ seizure  Coag: Afib, h/o CVA on warfarin pta, INR at goal on PTA: 4 mg Monday, Wednesday, Friday and Sunday. 2 mg on Tues, Thurs and Sat  CV: HF, afib, HTN, HLD, h/o CVA [name] 90/50s, HR 60-70s On milrinone @ 0.5, furo 80 iv once, amio, sildenafil, torsemide 80 daily, spiro 50 daily  GI: persistent nausea and vommiting, sorbitol, trial of reglan and GI consult  Goal of Therapy:  INR 2-3 Monitor platelets by anticoagulation protocol: Yes   Plan:  -Warfarin 4 mg today as per home dose -Daily INR  Thank you for allowing pharmacy to be a part of this patients  care team.  Rowe Robert Pharm.D., BCPS, AQ-Cardiology Clinical Pharmacist 08/29/2014 12:58 PM Pager: [phone] Phone: [phone]

## 2014-08-30 DIAGNOSIS — N183 Chronic kidney disease, stage 3 (moderate): Secondary | ICD-10-CM | POA: Diagnosis not present

## 2014-08-30 DIAGNOSIS — E876 Hypokalemia: Secondary | ICD-10-CM | POA: Diagnosis present

## 2014-08-30 DIAGNOSIS — R0602 Shortness of breath: Secondary | ICD-10-CM | POA: Diagnosis present

## 2014-08-30 DIAGNOSIS — N189 Chronic kidney disease, unspecified: Secondary | ICD-10-CM | POA: Diagnosis not present

## 2014-08-30 DIAGNOSIS — I429 Cardiomyopathy, unspecified: Secondary | ICD-10-CM | POA: Diagnosis present

## 2014-08-30 DIAGNOSIS — Z79899 Other long term (current) drug therapy: Secondary | ICD-10-CM | POA: Diagnosis not present

## 2014-08-30 DIAGNOSIS — R1013 Epigastric pain: Secondary | ICD-10-CM | POA: Diagnosis not present

## 2014-08-30 DIAGNOSIS — I272 Other secondary pulmonary hypertension: Secondary | ICD-10-CM | POA: Diagnosis present

## 2014-08-30 DIAGNOSIS — I5043 Acute on chronic combined systolic (congestive) and diastolic (congestive) heart failure: Secondary | ICD-10-CM | POA: Diagnosis not present

## 2014-08-30 DIAGNOSIS — I270 Primary pulmonary hypertension: Secondary | ICD-10-CM | POA: Diagnosis not present

## 2014-08-30 DIAGNOSIS — N179 Acute kidney failure, unspecified: Secondary | ICD-10-CM | POA: Diagnosis not present

## 2014-08-30 DIAGNOSIS — R627 Adult failure to thrive: Secondary | ICD-10-CM | POA: Diagnosis not present

## 2014-08-30 DIAGNOSIS — J9621 Acute and chronic respiratory failure with hypoxia: Secondary | ICD-10-CM | POA: Diagnosis not present

## 2014-08-30 DIAGNOSIS — Z7901 Long term (current) use of anticoagulants: Secondary | ICD-10-CM | POA: Diagnosis not present

## 2014-08-30 DIAGNOSIS — I252 Old myocardial infarction: Secondary | ICD-10-CM | POA: Diagnosis not present

## 2014-08-30 DIAGNOSIS — E871 Hypo-osmolality and hyponatremia: Secondary | ICD-10-CM | POA: Diagnosis present

## 2014-08-30 DIAGNOSIS — G40909 Epilepsy, unspecified, not intractable, without status epilepticus: Secondary | ICD-10-CM | POA: Diagnosis present

## 2014-08-30 DIAGNOSIS — N184 Chronic kidney disease, stage 4 (severe): Secondary | ICD-10-CM | POA: Diagnosis present

## 2014-08-30 DIAGNOSIS — I480 Paroxysmal atrial fibrillation: Secondary | ICD-10-CM | POA: Diagnosis present

## 2014-08-30 DIAGNOSIS — I509 Heart failure, unspecified: Secondary | ICD-10-CM | POA: Diagnosis not present

## 2014-08-30 DIAGNOSIS — Z8673 Personal history of transient ischemic attack (TIA), and cerebral infarction without residual deficits: Secondary | ICD-10-CM | POA: Diagnosis not present

## 2014-08-30 DIAGNOSIS — I481 Persistent atrial fibrillation: Secondary | ICD-10-CM | POA: Diagnosis not present

## 2014-08-30 DIAGNOSIS — E785 Hyperlipidemia, unspecified: Secondary | ICD-10-CM | POA: Diagnosis present

## 2014-08-30 DIAGNOSIS — R079 Chest pain, unspecified: Secondary | ICD-10-CM | POA: Diagnosis present

## 2014-08-30 DIAGNOSIS — Z66 Do not resuscitate: Secondary | ICD-10-CM | POA: Diagnosis present

## 2014-08-30 DIAGNOSIS — I5022 Chronic systolic (congestive) heart failure: Secondary | ICD-10-CM | POA: Diagnosis not present

## 2014-08-30 DIAGNOSIS — I129 Hypertensive chronic kidney disease with stage 1 through stage 4 chronic kidney disease, or unspecified chronic kidney disease: Secondary | ICD-10-CM | POA: Diagnosis present

## 2014-08-30 DIAGNOSIS — C480 Malignant neoplasm of retroperitoneum: Secondary | ICD-10-CM | POA: Diagnosis present

## 2014-08-30 LAB — PROTIME-INR
INR: 3.32 — ABNORMAL HIGH (ref 0.00–1.49)
Prothrombin Time: 33.9 seconds — ABNORMAL HIGH (ref 11.6–15.2)

## 2014-08-30 LAB — TROPONIN I: Troponin I: 0.05 ng/mL — ABNORMAL HIGH (ref <0.031)

## 2014-08-30 MED ORDER — ALBUTEROL SULFATE (2.5 MG/3ML) 0.083% IN NEBU
2.5000 mg | INHALATION_SOLUTION | Freq: Four times a day (QID) | RESPIRATORY_TRACT | Status: DC | PRN
  Administered 2014-08-30: 2.5 mg via RESPIRATORY_TRACT

## 2014-08-30 MED ORDER — ALBUTEROL SULFATE (2.5 MG/3ML) 0.083% IN NEBU
INHALATION_SOLUTION | RESPIRATORY_TRACT | Status: AC
  Filled 2014-08-30: qty 3

## 2014-08-30 NOTE — Progress Notes (Signed)
UR completed 

## 2014-08-30 NOTE — Progress Notes (Signed)
Advanced Heart Failure Rounding Note   Subjective:    Feeling better. Less nauseated. Breathing ok. Drinking lots of fluids. Weight up 1 pound. Creatinine up slightly.    Objective:   Weight Range:  Vital Signs:   Temp:  [97.4 F (36.3 C)-98 F (36.7 C)] 98 F (36.7 C) (03/12 0515) Pulse Rate:  [76-83] 76 (03/12 0515) Resp:  [18] 18 (03/12 0515) BP: (82-111)/(50-59) 82/54 mmHg (03/12 0929) SpO2:  [97 %-100 %] 97 % (03/12 0929) Weight:  [61.1 kg (134 lb 11.2 oz)-61.598 kg (135 lb 12.8 oz)] 61.598 kg (135 lb 12.8 oz) (03/12 0515) Last BM Date: 08/28/14  Weight change: Filed Weights   08/29/14 1428 08/30/14 0515  Weight: 61.1 kg (134 lb 11.2 oz) 61.598 kg (135 lb 12.8 oz)    Intake/Output:   Intake/Output Summary (Last 24 hours) at 08/30/14 1422 Last data filed at 08/30/14 1142  Gross per 24 hour  Intake 3777.29 ml  Output    251 ml  Net 3526.29 ml     Physical Exam: General: Elderly, NAD.No resp difficulty; interpreter present Cresenciano Genre) HEENT: normal Neck: supple. JVP 7-8; Carotids 2+ bilaterally; no bruits. No lymphadenopathy or thryomegaly appreciated.  Cor: PMI laterally displaced. Regular rhythm and rate. + s3  Lungs: clear Abdomen: soft, nontender, mildy distended. No hepatosplenomegaly. No bruits or masses. Good bowel sounds. Extremities: no cyanosis, clubbing, rash. no edema .  Neuro: alert & orientedx3, cranial nerves grossly intact. Moves all 4 extremities w/o difficulty. Affect pleasant.  Telemetry: NSR  Labs: Basic Metabolic Panel:  Recent Labs Lab 08/29/14 0429  NA 126*  K 3.5  CL 88*  CO2 26  GLUCOSE 116*  BUN 48*  CREATININE 1.81*  CALCIUM 8.5    Liver Function Tests: No results for input(s): AST, ALT, ALKPHOS, BILITOT, PROT, ALBUMIN in the last 168 hours. No results for input(s): LIPASE, AMYLASE in the last 168 hours. No results for input(s): AMMONIA in the last 168 hours.  CBC:  Recent Labs Lab 08/29/14 0429  WBC  13.8*  NEUTROABS 11.2*  HGB 10.2*  HCT 31.1*  MCV 79.1  PLT 369    Cardiac Enzymes:  Recent Labs Lab 08/29/14 1526 08/29/14 2029 08/30/14 0500  TROPONINI 0.03 0.03 0.05*    BNP: BNP (last 3 results)  Recent Labs  08/13/14 1921 08/19/14 2218 08/29/14 0429  BNP 2249.6* 1755.9* 1894.1*    ProBNP (last 3 results)  Recent Labs  03/01/14 1959 03/08/14 2230 06/05/14 0332  PROBNP 11880.0* 15334.0* 19533.0*      Other results:    Imaging: Dg Chest Port 1 View  08/29/2014   CLINICAL DATA:  Chest pain.  EXAM: PORTABLE CHEST - 1 VIEW  COMPARISON:  08/19/2014  FINDINGS: Dual lead left-sided pacemaker remains in place. Tip of the right central line in the SVC. Cardiomegaly is unchanged. Pulmonary edema is mildly progressed. No large pleural effusion or pneumothorax. Unchanged osseous structures.  IMPRESSION: Progressive pulmonary edema from prior.  Cardiomegaly is unchanged.   Electronically Signed   By: Jeb Levering M.D.   On: 08/29/2014 04:53      Medications:     Scheduled Medications: . amiodarone  200 mg Oral Daily  . magnesium oxide  400 mg Oral BID  . metoCLOPramide  5 mg Oral TID  . polyethylene glycol  17 g Oral Daily  . potassium chloride SA  20 mEq Oral BID  . sildenafil  20 mg Oral TID  . sorbitol  30 mL Oral Daily  .  spironolactone  50 mg Oral Daily  . torsemide  80 mg Oral Daily  . Warfarin - Pharmacist Dosing Inpatient   Does not apply q1800     Infusions: . milrinone 0.5 mcg/kg/min (08/30/14 0120)     PRN Medications:  acetaminophen, ALPRAZolam, gi cocktail, ondansetron (ZOFRAN) IV, sodium chloride, traMADol, zolpidem   Assessment:   1. Nausea/vomitting 2. Failure to thrive 3. Chronic systolic HF (EF 36%) on home milrinone 4. Acute on CKD, stage III-IV 5. PAF in NSR on amio 6. Retroperitoneal mass. 7. Hypokalemia/hyponatremia 8. PAH 9. Hypocalcemia 10. DNR    Plan/Discussion:    He continues to deteriorate from  end-stage HF despite high-dose milrinone and HHRN support. Readmitted again with n/v and FTT.  I have again recommended SNF or Hospice but he refuses. Will continue reglan and zofran for nausea. Try to get home in am. Watch renal function.   Length of Stay:   Glori Bickers MD 08/30/2014, 2:22 PM  Advanced Heart Failure Team Pager [phone] (M-F; 7a - 4p)  Please contact [facility] for night-coverage after hours (4p -7a ) and weekends on [email or web address]

## 2014-08-30 NOTE — Progress Notes (Signed)
PHARMACY NOTE  Pharmacy Consult :  81 y.o. male is currently on chronic Coumadin for atrial fibrillation .   Dosing Wt :  61.6 kg   Recent Labs  08/29/14 0429 08/30/14 0500  HGB 10.2*  --   HCT 31.1*  --   PLT 369  --   LABPROT 29.6* 33.9*  INR 2.79* 3.32*  CREATININE 1.81*  --    Current Medication[s] Include: Medication PTA: Prescriptions prior to admission  Medication Sig Dispense Refill Last Dose  . acetaminophen (TYLENOL) 325 MG tablet Take 2 tablets (650 mg total) by mouth every 6 (six) hours as needed for mild pain (or Fever >/= 101).   unknown  . amiodarone (PACERONE) 200 MG tablet Take 1 tablet (200 mg total) by mouth daily. 30 tablet 0 unknown  . magnesium oxide (MAG-OX) 400 MG tablet Take 1 tablet (400 mg total) by mouth 2 (two) times daily. 60 tablet 3 unknown  . metoCLOPramide (REGLAN) 10 MG tablet Take 1 tablet (10 mg total) by mouth 2 (two) times daily. 60 tablet 3 unknown  . milrinone (PRIMACOR) 20 MG/100ML SOLN infusion Inject 33.95 mcg/min into the vein continuous. 100 mL 0 08/29/2014 at Unknown time  . polyethylene glycol (MIRALAX / GLYCOLAX) packet Take 17 g by mouth daily. 14 each 6 unknown  . potassium chloride SA (K-DUR,KLOR-CON) 20 MEQ tablet Take 1 tablet (20 mEq total) by mouth 2 (two) times daily. 40 tablet 0 unknown  . sildenafil (REVATIO) 20 MG tablet Take 1 tablet (20 mg total) by mouth 3 (three) times daily. 90 tablet 3 unknown  . sorbitol 70 % SOLN Take 30 mLs by mouth daily. 500 mL 3 unknown  . spironolactone (ALDACTONE) 25 MG tablet Take 2 tablets (50 mg total) by mouth daily. 60 tablet 3 unknown  . torsemide (DEMADEX) 20 MG tablet Take 4 tablets (80 mg total) by mouth daily. 120 tablet 3 unknown  . traMADol (ULTRAM) 50 MG tablet Take 1 tablet (50 mg total) by mouth 2 (two) times daily as needed. for pain 60 tablet 0 unknown  . warfarin (COUMADIN) 4 MG tablet Take 4 mg Monday, Wednesday, Friday and Sunday. 2 mg on Tues,  Thurs and Sat 45 tablet 3 unknown   Scheduled:  Scheduled:  . amiodarone  200 mg Oral Daily  . enoxaparin (LOVENOX) injection  30 mg Subcutaneous Q24H  . magnesium oxide  400 mg Oral BID  . metoCLOPramide  5 mg Oral TID  . polyethylene glycol  17 g Oral Daily  . potassium chloride SA  20 mEq Oral BID  . sildenafil  20 mg Oral TID  . sorbitol  30 mL Oral Daily  . spironolactone  50 mg Oral Daily  . torsemide  80 mg Oral Daily  . Warfarin - Pharmacist Dosing Inpatient   Does not apply q1800   Infusion[s]: Infusions:  . milrinone 0.5 mcg/kg/min (08/30/14 0120)   Assessment :  Today's INR has increased significantly overnight on home Coumadin dose..   INR  3.32  Patient received Lovenox 30 mg sq yesterday with INR of 2.79.  No evidence of bleeding complications observed.  Goal :  TARGET INR 2-3   Plan : 1. Will discontinue Lovenox with INR > 2. 2. Will hold Coumadin dose today with INR > 3. 3. Daily INR's, CBC. Monitor for bleeding complications.   Follow Platelet counts.  Marthenia Rolling, Pharm.D. 08/30/2014  11:38 AM

## 2014-08-31 ENCOUNTER — Emergency Department (HOSPITAL_COMMUNITY): Payer: Medicare Other

## 2014-08-31 ENCOUNTER — Encounter (HOSPITAL_COMMUNITY): Payer: Self-pay | Admitting: *Deleted

## 2014-08-31 ENCOUNTER — Inpatient Hospital Stay (HOSPITAL_COMMUNITY)
Admission: EM | Admit: 2014-08-31 | Discharge: 2014-09-12 | DRG: 291 | Disposition: A | Discharge status: Home / Self Care | Payer: Medicare Other | Attending: Internal Medicine | Admitting: Internal Medicine

## 2014-08-31 DIAGNOSIS — I2510 Atherosclerotic heart disease of native coronary artery without angina pectoris: Secondary | ICD-10-CM | POA: Diagnosis present

## 2014-08-31 DIAGNOSIS — N179 Acute kidney failure, unspecified: Secondary | ICD-10-CM | POA: Diagnosis present

## 2014-08-31 DIAGNOSIS — I5022 Chronic systolic (congestive) heart failure: Secondary | ICD-10-CM

## 2014-08-31 DIAGNOSIS — R569 Unspecified convulsions: Secondary | ICD-10-CM | POA: Diagnosis present

## 2014-08-31 DIAGNOSIS — I130 Hypertensive heart and chronic kidney disease with heart failure and stage 1 through stage 4 chronic kidney disease, or unspecified chronic kidney disease: Principal | ICD-10-CM | POA: Diagnosis present

## 2014-08-31 DIAGNOSIS — I5043 Acute on chronic combined systolic (congestive) and diastolic (congestive) heart failure: Secondary | ICD-10-CM | POA: Insufficient documentation

## 2014-08-31 DIAGNOSIS — I452 Bifascicular block: Secondary | ICD-10-CM | POA: Diagnosis present

## 2014-08-31 DIAGNOSIS — Z9114 Patient's other noncompliance with medication regimen: Secondary | ICD-10-CM | POA: Diagnosis present

## 2014-08-31 DIAGNOSIS — E871 Hypo-osmolality and hyponatremia: Secondary | ICD-10-CM | POA: Diagnosis present

## 2014-08-31 DIAGNOSIS — I272 Other secondary pulmonary hypertension: Secondary | ICD-10-CM | POA: Diagnosis present

## 2014-08-31 DIAGNOSIS — E875 Hyperkalemia: Secondary | ICD-10-CM | POA: Diagnosis present

## 2014-08-31 DIAGNOSIS — E876 Hypokalemia: Secondary | ICD-10-CM | POA: Diagnosis not present

## 2014-08-31 DIAGNOSIS — Z888 Allergy status to other drugs, medicaments and biological substances status: Secondary | ICD-10-CM

## 2014-08-31 DIAGNOSIS — Z66 Do not resuscitate: Secondary | ICD-10-CM | POA: Diagnosis present

## 2014-08-31 DIAGNOSIS — Z95810 Presence of automatic (implantable) cardiac defibrillator: Secondary | ICD-10-CM

## 2014-08-31 DIAGNOSIS — J9620 Acute and chronic respiratory failure, unspecified whether with hypoxia or hypercapnia: Secondary | ICD-10-CM | POA: Diagnosis present

## 2014-08-31 DIAGNOSIS — R1900 Intra-abdominal and pelvic swelling, mass and lump, unspecified site: Secondary | ICD-10-CM | POA: Diagnosis present

## 2014-08-31 DIAGNOSIS — Z8673 Personal history of transient ischemic attack (TIA), and cerebral infarction without residual deficits: Secondary | ICD-10-CM

## 2014-08-31 DIAGNOSIS — I428 Other cardiomyopathies: Secondary | ICD-10-CM | POA: Diagnosis present

## 2014-08-31 DIAGNOSIS — R109 Unspecified abdominal pain: Secondary | ICD-10-CM

## 2014-08-31 DIAGNOSIS — N184 Chronic kidney disease, stage 4 (severe): Secondary | ICD-10-CM | POA: Diagnosis present

## 2014-08-31 DIAGNOSIS — Z8249 Family history of ischemic heart disease and other diseases of the circulatory system: Secondary | ICD-10-CM

## 2014-08-31 DIAGNOSIS — I5023 Acute on chronic systolic (congestive) heart failure: Secondary | ICD-10-CM | POA: Diagnosis not present

## 2014-08-31 DIAGNOSIS — I480 Paroxysmal atrial fibrillation: Secondary | ICD-10-CM | POA: Diagnosis present

## 2014-08-31 DIAGNOSIS — I959 Hypotension, unspecified: Secondary | ICD-10-CM | POA: Diagnosis present

## 2014-08-31 DIAGNOSIS — Z7901 Long term (current) use of anticoagulants: Secondary | ICD-10-CM

## 2014-08-31 HISTORY — DX: Atherosclerotic heart disease of native coronary artery without angina pectoris: I25.10

## 2014-08-31 LAB — PROTIME-INR
INR: 4.31 — AB (ref 0.00–1.49)
Prothrombin Time: 41.6 seconds — ABNORMAL HIGH (ref 11.6–15.2)

## 2014-08-31 LAB — BASIC METABOLIC PANEL
Anion gap: 12 (ref 5–15)
BUN: 58 mg/dL — AB (ref 6–23)
CALCIUM: 9 mg/dL (ref 8.4–10.5)
CHLORIDE: 87 mmol/L — AB (ref 96–112)
CO2: 24 mmol/L (ref 19–32)
CREATININE: 2.08 mg/dL — AB (ref 0.50–1.35)
GFR, EST AFRICAN AMERICAN: 33 mL/min — AB (ref >90)
GFR, EST NON AFRICAN AMERICAN: 29 mL/min — AB (ref >90)
Glucose, Bld: 211 mg/dL — ABNORMAL HIGH (ref 70–99)
Potassium: 6.2 mmol/L (ref 3.5–5.1)
SODIUM: 123 mmol/L — AB (ref 135–145)

## 2014-08-31 LAB — TROPONIN I: Troponin I: 0.03 ng/mL (ref <0.031)

## 2014-08-31 LAB — BRAIN NATRIURETIC PEPTIDE: B NATRIURETIC PEPTIDE 5: 3926.7 pg/mL — AB (ref 0.0–100.0)

## 2014-08-31 LAB — CBC
HCT: 31.9 % — ABNORMAL LOW (ref 39.0–52.0)
Hemoglobin: 10.6 g/dL — ABNORMAL LOW (ref 13.0–17.0)
MCH: 26.4 pg (ref 26.0–34.0)
MCHC: 33.2 g/dL (ref 30.0–36.0)
MCV: 79.4 fL (ref 78.0–100.0)
Platelets: 393 10*3/uL (ref 150–400)
RBC: 4.02 MIL/uL — ABNORMAL LOW (ref 4.22–5.81)
RDW: 18.8 % — AB (ref 11.5–15.5)
WBC: 15.7 10*3/uL — AB (ref 4.0–10.5)

## 2014-08-31 MED ORDER — INSULIN ASPART 100 UNIT/ML IV SOLN
10.0000 [IU] | Freq: Once | INTRAVENOUS | Status: AC
  Administered 2014-08-31: 10 [IU] via INTRAVENOUS
  Filled 2014-08-31: qty 1

## 2014-08-31 MED ORDER — SODIUM CHLORIDE 0.9 % IV SOLN
1.0000 g | Freq: Once | INTRAVENOUS | Status: AC
  Administered 2014-08-31: 1 g via INTRAVENOUS
  Filled 2014-08-31: qty 10

## 2014-08-31 MED ORDER — SODIUM POLYSTYRENE SULFONATE 15 GM/60ML PO SUSP
30.0000 g | Freq: Once | ORAL | Status: AC
  Administered 2014-08-31: 30 g via ORAL
  Filled 2014-08-31: qty 120

## 2014-08-31 MED ORDER — DEXTROSE 50 % IV SOLN
1.0000 | Freq: Once | INTRAVENOUS | Status: AC
  Administered 2014-08-31: 50 mL via INTRAVENOUS
  Filled 2014-08-31: qty 50

## 2014-08-31 NOTE — Progress Notes (Addendum)
PHARMACY NOTE  Pharmacy Consult :  81 y.o. male is currently on Coumadin for atrial fibrillation .   Dosing Wt :  63.3 kg  Hematology :  Recent Labs  08/29/14 0429 08/30/14 0500 08/31/14 0438  HGB 10.2*  --   --   HCT 31.1*  --   --   PLT 369  --   --   LABPROT 29.6* 33.9* 41.6*  INR 2.79* 3.32* 4.31*  CREATININE 1.81*  --   --     Current Medication[s] Include: Medication PTA: Prescriptions prior to admission  Medication Sig Dispense Refill Last Dose  . acetaminophen (TYLENOL) 325 MG tablet Take 2 tablets (650 mg total) by mouth every 6 (six) hours as needed for mild pain (or Fever >/= 101).   unknown  . amiodarone (PACERONE) 200 MG tablet Take 1 tablet (200 mg total) by mouth daily. 30 tablet 0 unknown  . magnesium oxide (MAG-OX) 400 MG tablet Take 1 tablet (400 mg total) by mouth 2 (two) times daily. 60 tablet 3 unknown  . metoCLOPramide (REGLAN) 10 MG tablet Take 1 tablet (10 mg total) by mouth 2 (two) times daily. 60 tablet 3 unknown  . milrinone (PRIMACOR) 20 MG/100ML SOLN infusion Inject 33.95 mcg/min into the vein continuous. 100 mL 0 08/29/2014 at Unknown time  . polyethylene glycol (MIRALAX / GLYCOLAX) packet Take 17 g by mouth daily. 14 each 6 unknown  . potassium chloride SA (K-DUR,KLOR-CON) 20 MEQ tablet Take 1 tablet (20 mEq total) by mouth 2 (two) times daily. 40 tablet 0 unknown  . sildenafil (REVATIO) 20 MG tablet Take 1 tablet (20 mg total) by mouth 3 (three) times daily. 90 tablet 3 unknown  . sorbitol 70 % SOLN Take 30 mLs by mouth daily. 500 mL 3 unknown  . spironolactone (ALDACTONE) 25 MG tablet Take 2 tablets (50 mg total) by mouth daily. 60 tablet 3 unknown  . torsemide (DEMADEX) 20 MG tablet Take 4 tablets (80 mg total) by mouth daily. 120 tablet 3 unknown  . traMADol (ULTRAM) 50 MG tablet Take 1 tablet (50 mg total) by mouth 2 (two) times daily as needed. for pain 60 tablet 0 unknown  . warfarin (COUMADIN) 4 MG tablet Take 4  mg Monday, Wednesday, Friday and Sunday. 2 mg on Tues, Thurs and Sat 45 tablet 3 unknown   Scheduled:  Scheduled:  . amiodarone  200 mg Oral Daily  . magnesium oxide  400 mg Oral BID  . metoCLOPramide  5 mg Oral TID  . polyethylene glycol  17 g Oral Daily  . potassium chloride SA  20 mEq Oral BID  . sildenafil  20 mg Oral TID  . sorbitol  30 mL Oral Daily  . spironolactone  50 mg Oral Daily  . torsemide  80 mg Oral Daily  . Warfarin - Pharmacist Dosing Inpatient   Does not apply q1800   Infusion[s]: Infusions:  . milrinone 0.5 mcg/kg/min (08/31/14 0708)   Assessment :  Today's INR continues to climb since admission and following usual Coumadin dose on Friday of 4 mg..   INR  4.31  No evidence of bleeding complications observed.  Goal :  TARGET INR 2-3   Plan : 1. Will continue to hold Coumadin today. 2. Daily INR's, CBC in AM. Monitor for bleeding complications.   Follow Platelet counts.  Marthenia Rolling, Pharm.D. 08/31/2014  11:17 AM

## 2014-08-31 NOTE — Discharge Summary (Signed)
Physician Discharge Summary     Cardiologist:  Bensimhon  Patient ID: Tyler Christensen MRN: [id number] DOB/AGE: 11/21/1932 81 y.o.  Admit date: 08/29/2014 Discharge date: 08/31/2014  Admission Diagnoses:  Chest Pain  Discharge Diagnoses:  1. Chest pain 2. Nausea and vomiting 3. Chronic systolic CHF on milrinone 4. Decreased compliance w/ meds due to weakness and poor memory issues. 5. General malaise, possibly adult FTT 6. PAH 7. Retroperitoneal liposarcoma   Discharged Condition: stable  Hospital Course:  Tyler Christensen is a 81 y.o. male Tyler Christensen male (non-english speaking) w/ PMHx significant for chronic systolic CHF (EF 01%), NICM, LBBB, PAF (on coumadin), HTN, HLD, CVA, Pulmonary Hypertension and h/o seizure d/o (2/2 CVA 05/2012).   Admitted to [facility] 06/2012 with acute decompensated heart failure EF 20%. As noted below, also diagnosed with severe pulmonary hypertension (PAPs ~ 90) from initial RHC and placed on Milrinone and Revatio (eventually transitioned to tadalafil 40) with marked clinical response and decrease in PA pressures. VQ low probability for pulmonary embolus. Discharged on Milrinone at 0.375 mcg via PICC. Discharge Weight 146 pounds. Attempted to wean milrinone in 9/14 but failed due to worsening HF and PAH. Milrinone restarted.   Admitted to the hospital 5/21-11/22/2013. Found to have staph aureus bacteremia treated with IV abx. TEE negative for endocarditis. Also found to retroperitoneal mass and biopsy revealed benign lesion. He had evidence of low output thought to be due to atrial fibrillation and milrinone increased. Attempted TEE and DC-CV but had LAA clot so deferred cardioversion.   He was admitted in 12/15 with acute on chronic systolic CHF. He was diuresed with IV Lasix and milrinone was increased to 0.5 mcg/kg/min.   Admitted 1/15 with abdominal distension and pain. Thought to be due to constipation. CT showed expanding retroperitoneal mass thought to be liposarcoma  (previously needle biopsy read as benign). HF was stable with normal co-ox during admit. Treated with sorbitol.   Admitted again for volume overload and ab pain in 07/2014. Diuresed and given sorbitol. Discharge weight 140 lbs  Admitted 3/1-08/23/2014 for heart failure, discharge weight 135 pounds.  Seen in CHF clinic 08/28/2014. HHRN was concerned and said he was not taking medicines routinely. He refused hospice. His weight was 140 lb, 6.4 ounces. He is a DO NOT RESUSCITATE but not interested in palliative care or hospice. The plan was to give him Lasix 80 mg IV once a week on Wednesdays with potassium to help maintain volume status. Not on ace/ARB due to renal function. Not on beta blockers due to low output state and milrinone. Plan was for him to get TED hose placed, apply for a motorized scooter, and consideration is to be given for starting Reglan 10 mg twice a day for GI issues. He is also to get a referral to GI for a gastric emptying study.  He developed chest pain and shortness of breath and came to the emergency room. Currently he is also complaining of nausea. His BNP was elevated and CXR with edema. He was given Lasix 80 mg IV at 6 am, and was for another 80 mg at 10 am. The second dose was held due to [name] 92.   [name] states (through interpreter) that the reason he came to the hospital was chest pain, which started last pm. It has improved, but is still there. He has been gagging, but  He was admitted for observation.  He was given one dose of 80mg  IV lasix and then changed to PO torsemide 80mg  daily.  Troponin mildly elevated-0.05.  He continues to deteriorate from end-stage HF despite high-dose milrinone and HHRN support.  SNF or hospice was recommended again but he refuses.   Continue aldactone and demedex. On home milrinone.  The patient was seen by Dr. Johnsie Cancel who felt he was stable for DC home.  Close FU will be arranged.     Consults: Pharmacy  Significant Diagnostic Studies:    PORTABLE CHEST - 1 VIEW  COMPARISON: 08/19/2014  FINDINGS: Dual lead left-sided pacemaker remains in place. Tip of the right central line in the SVC. Cardiomegaly is unchanged. Pulmonary edema is mildly progressed. No large pleural effusion or pneumothorax. Unchanged osseous structures.  IMPRESSION: Progressive pulmonary edema from prior. Cardiomegaly is unchanged.  Treatments: See above.   Discharge Exam: Blood pressure 94/59, pulse 70, temperature 97.5 F (36.4 C), temperature source Oral, resp. rate 16, height 5\' 8"  (1.727 m), weight 139 lb 8.8 oz (63.3 kg), SpO2 99 %.   Disposition: 01-Home or Self Care      Discharge Instructions    Diet - low sodium heart healthy    Complete by:  As directed      Discharge instructions    Complete by:  As directed   Monitor your weight every morning.  If you gain 3 pounds in 24 hours, or 5 pounds in a week, call the office for instructions.     Increase activity slowly    Complete by:  As directed             Medication List    TAKE these medications        acetaminophen 325 MG tablet  Commonly known as:  TYLENOL  Take 2 tablets (650 mg total) by mouth every 6 (six) hours as needed for mild pain (or Fever >/= 101).     amiodarone 200 MG tablet  Commonly known as:  PACERONE  Take 1 tablet (200 mg total) by mouth daily.     magnesium oxide 400 MG tablet  Commonly known as:  MAG-OX  Take 1 tablet (400 mg total) by mouth 2 (two) times daily.     metoCLOPramide 10 MG tablet  Commonly known as:  REGLAN  Take 1 tablet (10 mg total) by mouth 2 (two) times daily.     milrinone 20 MG/100ML Soln infusion  Commonly known as:  PRIMACOR  Inject 33.95 mcg/min into the vein continuous.     polyethylene glycol packet  Commonly known as:  MIRALAX / GLYCOLAX  Take 17 g by mouth daily.     potassium chloride SA 20 MEQ tablet  Commonly known as:  K-DUR,KLOR-CON  Take 1 tablet (20 mEq total) by mouth 2 (two) times daily.      sildenafil 20 MG tablet  Commonly known as:  REVATIO  Take 1 tablet (20 mg total) by mouth 3 (three) times daily.     sorbitol 70 % Soln  Take 30 mLs by mouth daily.     spironolactone 25 MG tablet  Commonly known as:  ALDACTONE  Take 2 tablets (50 mg total) by mouth daily.     torsemide 20 MG tablet  Commonly known as:  DEMADEX  Take 4 tablets (80 mg total) by mouth daily.     traMADol 50 MG tablet  Commonly known as:  ULTRAM  Take 1 tablet (50 mg total) by mouth 2 (two) times daily as needed. for pain     warfarin 4 MG tablet  Commonly known as:  COUMADIN  Take 4 mg Monday, Wednesday, Friday and Sunday. 2 mg on Tues, Thurs and Sat       Follow-up Information    Follow up with Harvie Junior, MD.   Specialty:  Specialist   Contact information:   [place] [address] [phone]       Follow up with Glori Bickers, MD On 09/25/2014.   Specialty:  Cardiology   Why:  12:00 PM   Contact information:   [place] [address] [phone]      Greater than 30 minutes was spent completing the patient's discharge.   SignedTarri Fuller, [place] 08/31/2014, 11:39 AM

## 2014-08-31 NOTE — ED Provider Notes (Signed)
Patient seen/examined in the Emergency Department in conjunction with Resident Physician Provider Proulx Patient reports CP/SOB Exam : pt awake/alert, he is ill appearing and mildly tachypneic Plan: he will need admission.  He is hyperkalemic and this will be treated.     Ripley Fraise, MD 08/31/14 [phone]

## 2014-08-31 NOTE — ED Notes (Signed)
Interpreter phone used for assessment- pt reports that after leaving hospital today he became more short of breath.  Pt also admits to pressure in his chest.  Pt noted to have labored breathing at present- 97%.

## 2014-08-31 NOTE — Care Management Note (Signed)
    Page 1 of 2   08/31/2014     1:58:00 PM CARE MANAGEMENT NOTE 08/31/2014  Patient:  GERRIT, RAFALSKI   Account Number:  [id number]  Date Initiated:  08/31/2014  Documentation initiated by:  [facility]  Subjective/Objective Assessment:   adm: Chest pain     Action/Plan:   discharge planning   Anticipated DC Date:  08/31/2014   Anticipated DC Plan:  [employer]  CM consult      [facility] Choice  Resumption Of Svcs/PTA Provider   Choice offered to / List presented to:     DME arranged  OXYGEN  IV PUMP/EQUIPMENT        HH arranged  HH-1 RN  HH-10 DISEASE MANAGEMENT  IV Antibiotics      [place].   Status of service:  Completed, signed off Medicare Important Message given?   (If response is "NO", the following Medicare IM given date fields will be blank) Date Medicare IM given:   Medicare IM given by:   Date Additional Medicare IM given:   Additional Medicare IM given by:    Discharge Disposition:  [place]  Per UR Regulation:    If discussed at Long Length of Stay Meetings, dates discussed:    Comments:  08/31/14 13:55 CM called [facility] rep, Emma to please arrange for [facility] to come here to hospital room to hook up to milrinone gtt.  CM went to ED as pt states he lost his Oxygen tank in ED but not tank was to be found.  [facility] rep, Jeneen Rinks is going to deliver hoe oxygen tank to room prior to discharge.  No other CM needs were comunicated.  Mariane Masters, BSN, Cm [phone].

## 2014-08-31 NOTE — ED Notes (Signed)
Doctor at bedside.

## 2014-08-31 NOTE — Progress Notes (Signed)
Subjective:  Tyler Christensen nurse intepreted for me  Patient has fatigue and dyspnea but at baseline and wants to go home   Objective:  Filed Vitals:   08/30/14 1538 08/30/14 1900 08/30/14 2026 08/31/14 0528  BP: 98/56  86/48 94/59  Pulse: 80  73 70  Temp: 98.1 F (36.7 C)  97.7 F (36.5 C) 97.5 F (36.4 C)  TempSrc: Oral  Oral   Resp: 18  20 16   Height:      Weight:    63.3 kg (139 lb 8.8 oz)  SpO2: 98% 94% 100% 99%    Intake/Output from previous day:  Intake/Output Summary (Last 24 hours) at 08/31/14 1111 Last data filed at 08/31/14 9326  Gross per 24 hour  Intake 1046.4 ml  Output    301 ml  Net  745.4 ml    Physical Exam: Affect appropriate Elderly Tyler Christensen  HEENT: normal Neck supple with no adenopathy JVP normal no bruits no thyromegaly Lungs clear with no wheezing and good diaphragmatic motion Heart:  S1/S2 MR murmur, no rub, gallop or click PMI enlarged BiV AICD under left clavicle  Abdomen: benighn, BS positve, no tenderness, no AAA no bruit.  No HSM or HJR Distal pulses intact with no bruits Plus one bilateral  edema Neuro non-focal Skin warm and dry No muscular weakness   Lab Results: Basic Metabolic Panel:  Recent Labs  08/29/14 0429  NA 126*  K 3.5  CL 88*  CO2 26  GLUCOSE 116*  BUN 48*  CREATININE 1.81*  CALCIUM 8.5   CBC:  Recent Labs  08/29/14 0429  WBC 13.8*  NEUTROABS 11.2*  HGB 10.2*  HCT 31.1*  MCV 79.1  PLT 369   Cardiac Enzymes:  Recent Labs  08/29/14 1526 08/29/14 2029 08/30/14 0500  TROPONINI 0.03 0.03 0.05*    Imaging: No results found.  Cardiac Studies:  ECG:  A sensed V pacing    Telemetry:  Sinus no VT   Echo:  Study Conclusions  - Left ventricle: The cavity size was moderately dilated. Wall thickness was normal. Systolic function was severely reduced. The estimated ejection fraction was 15%. There is moderate hypokinesis of the inferior myocardium. The study is not technically  sufficient to allow evaluation of LV diastolic function. - Aortic valve: There was trivial regurgitation. - Mitral valve: There was moderate regurgitation. - Left atrium: The atrium was mildly dilated. - Right ventricle: The cavity size was mildly dilated. Systolic function was mildly reduced. - Right atrium: The atrium was mildly dilated. - Tricuspid valve: There was moderate regurgitation. - Pulmonary arteries: PA peak pressure: 85 mm Hg (S).  Medications:   . amiodarone  200 mg Oral Daily  . magnesium oxide  400 mg Oral BID  . metoCLOPramide  5 mg Oral TID  . polyethylene glycol  17 g Oral Daily  . potassium chloride SA  20 mEq Oral BID  . sildenafil  20 mg Oral TID  . sorbitol  30 mL Oral Daily  . spironolactone  50 mg Oral Daily  . torsemide  80 mg Oral Daily  . Warfarin - Pharmacist Dosing Inpatient   Does not apply q1800     . milrinone 0.5 mcg/kg/min (08/31/14 0708)    Assessment/Plan:  CHF:  See note by DB  Yesterday  Refuses palliative care.  Continue aldactone and demedex.  On home milrinone D/C home with close f/u heart failure clinic    Note indicates full code with active defibrillator  Jenkins Rouge 08/31/2014, 11:11 AM

## 2014-08-31 NOTE — ED Provider Notes (Signed)
CSN: [id number]     Arrival date & time 08/31/14  2117 History   First MD Initiated Contact with Patient 08/31/14 2154     Chief Complaint  Patient presents with  . Chest Pain     (Consider location/radiation/quality/duration/timing/severity/associated sxs/prior Treatment) Patient is a 81 y.o. male presenting with shortness of breath.  Shortness of Breath Severity:  Severe Onset quality:  Gradual Duration:  1 day Timing:  Constant Progression:  Worsening Chronicity:  Recurrent Context: not animal exposure and not URI   Relieved by:  Diuretics Worsened by:  Activity Ineffective treatments:  Diuretics Associated symptoms: chest pain   Associated symptoms: no abdominal pain, no cough, no diaphoresis, no fever, no headaches, no rash, no sputum production and no vomiting   Chest pain:    Quality:  Dull   Severity:  Moderate   Onset quality:  Gradual   Duration:  12 hours (12)   Timing:  Constant   Progression:  Worsening   Chronicity:  Recurrent   Past Medical History  Diagnosis Date  . HTN (hypertension)   . High cholesterol   . CHF (congestive heart failure)   . BBBB (bilateral bundle branch block)   . LBBB (left bundle branch block)   . Seizures 06/11/2012    new onset/notes (06/11/2012)  . SOB (shortness of breath)     "sometimes when I lay down;; related to not taking my RX" (06/11/2012)  . Myocardial infarction     06/10/2012  . Atrial fibrillation   . Stroke   . Atrial thrombus     left  . Pulmonary HTN   . Atrial fibrillation 06/12/2012  . Coronary artery disease    Past Surgical History  Procedure Laterality Date  . Throat surgery  1942    "and nose" (06/11/2012); ?T&A  . Inguinal hernia repair  ~ 2008    "both sides" (06/11/2012)  . Tee without cardioversion  06/29/2012    Procedure: TRANSESOPHAGEAL ECHOCARDIOGRAM (TEE);  Surgeon: Jolaine Artist, MD;  Location: [facility] ENDOSCOPY;   . Cardioversion N/A 08/14/2012    Procedure: CARDIOVERSION;  Surgeon:  Jolaine Artist, MD;  Location: [facility] ENDOSCOPY;  . Tee without cardioversion N/A 11/12/2013    Procedure: TRANSESOPHAGEAL ECHOCARDIOGRAM (TEE);  Surgeon: Candee Furbish, MD;  Location: [facility] ENDOSCOPY;   . Tee without cardioversion N/A 11/19/2013    Procedure: TRANSESOPHAGEAL ECHOCARDIOGRAM (TEE);  Surgeon: Jolaine Artist, MD;  Location: [facility] ENDOSCOPY;   . Cardioversion N/A 12/27/2013    Procedure: CARDIOVERSION;  Surgeon: Jolaine Artist, MD;  Location: [facility] ENDOSCOPY;   . Left and right heart catheterization with coronary angiogram N/A 07/02/2012    Procedure: LEFT AND RIGHT HEART CATHETERIZATION WITH CORONARY ANGIOGRAM;  Surgeon: Jolaine Artist, MD;  Location: [place] CATH LAB; Mild non-obstructive CAD, severe PAH, PA = 93/42 (59), severe NICM w/ EF 15%  . Right heart catheterization N/A 07/06/2012    Procedure: RIGHT HEART CATH;  Surgeon: Jolaine Artist, MD; to assess response to milrinone; Moderate PH with profound reduction in PVR with milrinone/PDE-5 inhibitor   . Bi-ventricular implantable cardioverter defibrillator N/A 10/26/2012    Procedure: BI-VENTRICULAR IMPLANTABLE CARDIOVERTER DEFIBRILLATOR  (CRT-D);  Surgeon: Evans Lance, MD;  Medtronic Evera XT CRTD, BiV ICD serial #[id number]     Family History  Problem Relation Age of Onset  . Heart attack      father had MI in his 57s, no fhx of early heart problem before age 67  . Heart attack  mother died of MI around 2000   History  Substance Use Topics  . Smoking status: Never Smoker   . Smokeless tobacco: Never Used  . Alcohol Use: No    Review of Systems  Constitutional: Negative for fever, chills and diaphoresis.  Eyes: Negative for redness.  Respiratory: Positive for shortness of breath. Negative for cough and sputum production.   Cardiovascular: Positive for chest pain.  Gastrointestinal: Negative for nausea, vomiting, abdominal pain and diarrhea.  Genitourinary: Negative for dysuria.  Skin: Negative for rash.   Neurological: Negative for headaches.  All other systems reviewed and are negative.     Allergies  Lisinopril  Home Medications   Prior to Admission medications   Medication Sig Start Date End Date Taking? Authorizing Provider  acetaminophen (TYLENOL) 325 MG tablet Take 2 tablets (650 mg total) by mouth every 6 (six) hours as needed for mild pain (or Fever >/= 101). 07/10/14   Kinnie Feil, MD  amiodarone (PACERONE) 200 MG tablet Take 1 tablet (200 mg total) by mouth daily. 07/10/14   Kinnie Feil, MD  magnesium oxide (MAG-OX) 400 MG tablet Take 1 tablet (400 mg total) by mouth 2 (two) times daily. 04/25/14   Jolaine Artist, MD  metoCLOPramide (REGLAN) 10 MG tablet Take 1 tablet (10 mg total) by mouth 2 (two) times daily. 07/31/14   Jolaine Artist, MD  milrinone (PRIMACOR) 20 MG/100ML SOLN infusion Inject 33.95 mcg/min into the vein continuous. 06/10/14   Rande Brunt, NP  polyethylene glycol [facility] / GLYCOLAX) packet Take 17 g by mouth daily. 08/23/14   Isaiah Serge, NP  potassium chloride SA (K-DUR,KLOR-CON) 20 MEQ tablet Take 1 tablet (20 mEq total) by mouth 2 (two) times daily. 07/31/14   Jolaine Artist, MD  sildenafil (REVATIO) 20 MG tablet Take 1 tablet (20 mg total) by mouth 3 (three) times daily. 03/07/14   Rande Brunt, NP  sorbitol 70 % SOLN Take 30 mLs by mouth daily. 07/17/14   Jolaine Artist, MD  spironolactone (ALDACTONE) 25 MG tablet Take 2 tablets (50 mg total) by mouth daily. 07/22/14   Jolaine Artist, MD  torsemide (DEMADEX) 20 MG tablet Take 4 tablets (80 mg total) by mouth daily. 04/25/14   Jolaine Artist, MD  traMADol (ULTRAM) 50 MG tablet Take 1 tablet (50 mg total) by mouth 2 (two) times daily as needed. for pain 07/17/14   Jolaine Artist, MD  warfarin (COUMADIN) 4 MG tablet Take 4 mg Monday, Wednesday, Friday and Sunday. 2 mg on Tues, Thurs and Sat 06/10/14   Rande Brunt, NP   BP 97/52 mmHg  Pulse 72  Resp 17  Wt 152 lb 11 oz  (69.259 kg)  SpO2 100% Physical Exam  Constitutional: He is oriented to person, place, and time. No distress.  HENT:  Head: Normocephalic and atraumatic.  Eyes: EOM are normal. Pupils are equal, round, and reactive to light.  Neck: Normal range of motion. Neck supple.  Cardiovascular: Normal rate.   Pulmonary/Chest: He is in respiratory distress. He has rales.  Increased WOB and tachypnea   Abdominal: Soft. There is no tenderness.  Musculoskeletal: Normal range of motion.  Neurological: He is alert and oriented to person, place, and time.  Skin: No rash noted. He is not diaphoretic.  Psychiatric: He has a normal mood and affect.    ED Course  Procedures (including critical care time) Labs Review Labs Reviewed  CBC - Abnormal; Notable for  the following:    WBC 15.7 (*)    RBC 4.02 (*)    Hemoglobin 10.6 (*)    HCT 31.9 (*)    RDW 18.8 (*)    All other components within normal limits  BASIC METABOLIC PANEL - Abnormal; Notable for the following:    Sodium 123 (*)    Potassium 6.2 (*)    Chloride 87 (*)    Glucose, Bld 211 (*)    BUN 58 (*)    Creatinine, Ser 2.08 (*)    GFR calc non Af Amer 29 (*)    GFR calc Af Amer 33 (*)    All other components within normal limits  BRAIN NATRIURETIC PEPTIDE - Abnormal; Notable for the following:    B Natriuretic Peptide 3926.7 (*)    All other components within normal limits  TROPONIN I    Imaging Review No results found.   EKG Interpretation   Date/Time:  Sunday August 31 2014 22:01:05 EDT Ventricular Rate:  124 PR Interval:  160 QRS Duration: 210 QT Interval:  420 QTC Calculation: 603 R Axis:   166 Text Interpretation:  Sinus tachycardia paced rhythm noted Consider right  atrial enlargement Nonspecific intraventricular conduction delay  Nonspecific ST depression Artifact in lead(s) I III aVL Confirmed by  Christy Gentles  MD, Elenore Rota ([id number]) on 08/31/2014 10:11:52 PM      MDM   Final diagnoses:  None    81 y/o male  with advanced heart failure (on milrinone gtt at home), CAD,HTN, who was just discharged from the hospital today for a hospitalization for CHF exacerbation. This morning the patient felt that he was ready to go home and left the hospital.  During the hospitalization, the team was working with the patient on goals of care and there have been multiple discussions about possible hospice placement however the patient doesn't desire this yet per notes during hospitalization.  After returning home today, patient has continued to have severe shortness of breath and has also been having chest pain.  The chest pain is right sided, dull, w/ no alleviating/aggravating factors.  His shortness of breath became so severe that he felt he should come back to the ED.  Exam as above, he is tachypneic w/ increased WOB and crackles bilaterally.  He is satting well on RA, his bp is in the 90's/low 100's.    Concern for CHF exacerbation will obtain CXR, cbc/bmp/bnp/trop  First trop neg, bnp w/ worsening of renal function w/ hyperkalemia.  His EKG shows a paced rhythm so it is difficult to assess for T waves changes.  Will give ca gluconate, insulin, kayexalate. Also hyponatremia on bmp.  Have spoken w/ cards attn on call who suggests admission to hosp service.  Have spoken w/ hosp who will eval the patient and arrange for admission to hosp service vs. Critical care. Will hold off on any diuretics or nitro or bipap at this time given soft pressures.    Jarome Matin, MD 09/01/14 9983  Ripley Fraise, MD 09/01/14 2252

## 2014-08-31 NOTE — ED Notes (Signed)
The pt is c/o  Chest pain and sob for 2-3 days.  He has been to   [facility] for the same.  He still has  Old bracelets  hosp on his arm

## 2014-09-01 DIAGNOSIS — R079 Chest pain, unspecified: Secondary | ICD-10-CM | POA: Diagnosis not present

## 2014-09-01 DIAGNOSIS — I481 Persistent atrial fibrillation: Secondary | ICD-10-CM

## 2014-09-01 DIAGNOSIS — I130 Hypertensive heart and chronic kidney disease with heart failure and stage 1 through stage 4 chronic kidney disease, or unspecified chronic kidney disease: Secondary | ICD-10-CM | POA: Diagnosis present

## 2014-09-01 DIAGNOSIS — N183 Chronic kidney disease, stage 3 (moderate): Secondary | ICD-10-CM | POA: Diagnosis not present

## 2014-09-01 DIAGNOSIS — I428 Other cardiomyopathies: Secondary | ICD-10-CM | POA: Diagnosis present

## 2014-09-01 DIAGNOSIS — N184 Chronic kidney disease, stage 4 (severe): Secondary | ICD-10-CM | POA: Diagnosis present

## 2014-09-01 DIAGNOSIS — I452 Bifascicular block: Secondary | ICD-10-CM | POA: Diagnosis present

## 2014-09-01 DIAGNOSIS — J9621 Acute and chronic respiratory failure with hypoxia: Secondary | ICD-10-CM | POA: Diagnosis not present

## 2014-09-01 DIAGNOSIS — I5043 Acute on chronic combined systolic (congestive) and diastolic (congestive) heart failure: Secondary | ICD-10-CM

## 2014-09-01 DIAGNOSIS — R1013 Epigastric pain: Secondary | ICD-10-CM | POA: Diagnosis not present

## 2014-09-01 DIAGNOSIS — E875 Hyperkalemia: Secondary | ICD-10-CM | POA: Diagnosis present

## 2014-09-01 DIAGNOSIS — I272 Other secondary pulmonary hypertension: Secondary | ICD-10-CM | POA: Diagnosis present

## 2014-09-01 DIAGNOSIS — I5023 Acute on chronic systolic (congestive) heart failure: Secondary | ICD-10-CM | POA: Diagnosis present

## 2014-09-01 DIAGNOSIS — Z8249 Family history of ischemic heart disease and other diseases of the circulatory system: Secondary | ICD-10-CM | POA: Diagnosis not present

## 2014-09-01 DIAGNOSIS — I2510 Atherosclerotic heart disease of native coronary artery without angina pectoris: Secondary | ICD-10-CM | POA: Diagnosis present

## 2014-09-01 DIAGNOSIS — N189 Chronic kidney disease, unspecified: Secondary | ICD-10-CM | POA: Diagnosis not present

## 2014-09-01 DIAGNOSIS — E876 Hypokalemia: Secondary | ICD-10-CM | POA: Diagnosis not present

## 2014-09-01 DIAGNOSIS — R1900 Intra-abdominal and pelvic swelling, mass and lump, unspecified site: Secondary | ICD-10-CM | POA: Diagnosis present

## 2014-09-01 DIAGNOSIS — I480 Paroxysmal atrial fibrillation: Secondary | ICD-10-CM | POA: Diagnosis present

## 2014-09-01 DIAGNOSIS — Z8673 Personal history of transient ischemic attack (TIA), and cerebral infarction without residual deficits: Secondary | ICD-10-CM | POA: Diagnosis not present

## 2014-09-01 DIAGNOSIS — J9620 Acute and chronic respiratory failure, unspecified whether with hypoxia or hypercapnia: Secondary | ICD-10-CM | POA: Diagnosis present

## 2014-09-01 DIAGNOSIS — Z66 Do not resuscitate: Secondary | ICD-10-CM | POA: Diagnosis present

## 2014-09-01 DIAGNOSIS — E871 Hypo-osmolality and hyponatremia: Secondary | ICD-10-CM | POA: Diagnosis present

## 2014-09-01 DIAGNOSIS — Z888 Allergy status to other drugs, medicaments and biological substances status: Secondary | ICD-10-CM | POA: Diagnosis not present

## 2014-09-01 DIAGNOSIS — R569 Unspecified convulsions: Secondary | ICD-10-CM | POA: Diagnosis present

## 2014-09-01 DIAGNOSIS — Z7901 Long term (current) use of anticoagulants: Secondary | ICD-10-CM | POA: Diagnosis not present

## 2014-09-01 DIAGNOSIS — Z9114 Patient's other noncompliance with medication regimen: Secondary | ICD-10-CM | POA: Diagnosis present

## 2014-09-01 DIAGNOSIS — I270 Primary pulmonary hypertension: Secondary | ICD-10-CM

## 2014-09-01 DIAGNOSIS — Z95810 Presence of automatic (implantable) cardiac defibrillator: Secondary | ICD-10-CM | POA: Diagnosis not present

## 2014-09-01 DIAGNOSIS — I509 Heart failure, unspecified: Secondary | ICD-10-CM

## 2014-09-01 DIAGNOSIS — I959 Hypotension, unspecified: Secondary | ICD-10-CM | POA: Diagnosis present

## 2014-09-01 DIAGNOSIS — N179 Acute kidney failure, unspecified: Secondary | ICD-10-CM

## 2014-09-01 LAB — COMPREHENSIVE METABOLIC PANEL
ALT: 49 U/L (ref 0–53)
ANION GAP: 9 (ref 5–15)
AST: 50 U/L — ABNORMAL HIGH (ref 0–37)
Albumin: 2.9 g/dL — ABNORMAL LOW (ref 3.5–5.2)
Alkaline Phosphatase: 288 U/L — ABNORMAL HIGH (ref 39–117)
BUN: 57 mg/dL — AB (ref 6–23)
CALCIUM: 8.6 mg/dL (ref 8.4–10.5)
CO2: 28 mmol/L (ref 19–32)
Chloride: 88 mmol/L — ABNORMAL LOW (ref 96–112)
Creatinine, Ser: 1.88 mg/dL — ABNORMAL HIGH (ref 0.50–1.35)
GFR calc Af Amer: 38 mL/min — ABNORMAL LOW (ref >90)
GFR calc non Af Amer: 32 mL/min — ABNORMAL LOW (ref >90)
Glucose, Bld: 118 mg/dL — ABNORMAL HIGH (ref 70–99)
Potassium: 4.3 mmol/L (ref 3.5–5.1)
Sodium: 125 mmol/L — ABNORMAL LOW (ref 135–145)
Total Bilirubin: 0.9 mg/dL (ref 0.3–1.2)
Total Protein: 6.5 g/dL (ref 6.0–8.3)

## 2014-09-01 LAB — APTT: APTT: 63 s — AB (ref 24–37)

## 2014-09-01 LAB — PROTIME-INR
INR: 3.97 — ABNORMAL HIGH (ref 0.00–1.49)
INR: 4.3 — AB (ref 0.00–1.49)
PROTHROMBIN TIME: 41.6 s — AB (ref 11.6–15.2)
Prothrombin Time: 39.1 seconds — ABNORMAL HIGH (ref 11.6–15.2)

## 2014-09-01 LAB — MAGNESIUM: MAGNESIUM: 2.2 mg/dL (ref 1.5–2.5)

## 2014-09-01 LAB — SURGICAL PCR SCREEN
MRSA, PCR: NEGATIVE
Staphylococcus aureus: POSITIVE — AB

## 2014-09-01 MED ORDER — FUROSEMIDE 10 MG/ML IJ SOLN
80.0000 mg | Freq: Once | INTRAMUSCULAR | Status: AC
  Administered 2014-09-01: 80 mg via INTRAVENOUS
  Filled 2014-09-01: qty 8

## 2014-09-01 MED ORDER — ACETAMINOPHEN 325 MG PO TABS: 650.0000 mg | ORAL_TABLET | Freq: Four times a day (QID) | ORAL | Status: DC | PRN

## 2014-09-01 MED ORDER — METOCLOPRAMIDE HCL 10 MG PO TABS
10.0000 mg | ORAL_TABLET | Freq: Two times a day (BID) | ORAL | Status: DC
  Filled 2014-09-01: qty 1

## 2014-09-01 MED ORDER — SPIRONOLACTONE 50 MG PO TABS: 50.0000 mg | ORAL_TABLET | Freq: Every day | ORAL | Status: DC

## 2014-09-01 MED ORDER — TRAMADOL HCL 50 MG PO TABS: 50.0000 mg | ORAL_TABLET | Freq: Two times a day (BID) | ORAL | Status: DC | PRN

## 2014-09-01 MED ORDER — WARFARIN - PHARMACIST DOSING INPATIENT
Freq: Every day | Status: DC
  Administered 2014-09-02 – 2014-09-05 (×4)

## 2014-09-01 MED ORDER — CHLORHEXIDINE GLUCONATE CLOTH 2 % EX PADS
6.0000 | MEDICATED_PAD | Freq: Every day | CUTANEOUS | Status: DC
  Administered 2014-09-01: 6 via TOPICAL

## 2014-09-01 MED ORDER — SORBITOL 70 % SOLN
30.0000 mL | Freq: Every day | Status: DC
  Administered 2014-09-01 – 2014-09-12 (×11): 30 mL via ORAL
  Filled 2014-09-01 (×12): qty 30

## 2014-09-01 MED ORDER — SODIUM CHLORIDE 0.9 % IJ SOLN: 3.0000 mL | INTRAMUSCULAR | Status: DC | PRN

## 2014-09-01 MED ORDER — WARFARIN SODIUM 4 MG PO TABS
4.0000 mg | ORAL_TABLET | ORAL | Status: DC
  Filled 2014-09-01: qty 1

## 2014-09-01 MED ORDER — SODIUM CHLORIDE 0.9 % IV SOLN
250.0000 mL | INTRAVENOUS | Status: DC | PRN
  Administered 2014-09-07: 250 mL via INTRAVENOUS

## 2014-09-01 MED ORDER — SILDENAFIL CITRATE 20 MG PO TABS
20.0000 mg | ORAL_TABLET | Freq: Three times a day (TID) | ORAL | Status: DC
  Administered 2014-09-01 – 2014-09-12 (×34): 20 mg via ORAL
  Filled 2014-09-01 (×43): qty 1

## 2014-09-01 MED ORDER — MAGNESIUM OXIDE 400 (241.3 MG) MG PO TABS
400.0000 mg | ORAL_TABLET | Freq: Two times a day (BID) | ORAL | Status: DC
  Administered 2014-09-01 – 2014-09-12 (×24): 400 mg via ORAL
  Filled 2014-09-01 (×24): qty 1

## 2014-09-01 MED ORDER — DEXTROSE 5 % IV SOLN
0.0000 ug/min | INTRAVENOUS | Status: DC
  Administered 2014-09-01: 7 ug/min via INTRAVENOUS
  Administered 2014-09-01: 2 ug/min via INTRAVENOUS
  Administered 2014-09-01: 7 ug/min via INTRAVENOUS
  Filled 2014-09-01 (×3): qty 4

## 2014-09-01 MED ORDER — MUPIROCIN 2 % EX OINT
1.0000 "application " | TOPICAL_OINTMENT | Freq: Two times a day (BID) | CUTANEOUS | Status: AC
  Administered 2014-09-01 – 2014-09-05 (×10): 1 via NASAL
  Filled 2014-09-01 (×2): qty 22

## 2014-09-01 MED ORDER — MILRINONE IN DEXTROSE 20 MG/100ML IV SOLN: 0.5000 ug/kg/min | INTRAVENOUS | Status: DC

## 2014-09-01 MED ORDER — MILRINONE IN DEXTROSE 20 MG/100ML IV SOLN
0.5000 ug/kg/min | INTRAVENOUS | Status: DC
  Administered 2014-09-01 – 2014-09-12 (×26): 0.5 ug/kg/min via INTRAVENOUS
  Filled 2014-09-01 (×31): qty 100

## 2014-09-01 MED ORDER — ONDANSETRON HCL 4 MG/2ML IJ SOLN: 4.0000 mg | Freq: Four times a day (QID) | INTRAMUSCULAR | Status: DC | PRN

## 2014-09-01 MED ORDER — TORSEMIDE 20 MG PO TABS: 80.0000 mg | ORAL_TABLET | Freq: Every day | ORAL | Status: DC

## 2014-09-01 MED ORDER — SODIUM CHLORIDE 0.9 % IJ SOLN
3.0000 mL | Freq: Two times a day (BID) | INTRAMUSCULAR | Status: DC
  Administered 2014-09-01 – 2014-09-10 (×13): 3 mL via INTRAVENOUS

## 2014-09-01 MED ORDER — POLYETHYLENE GLYCOL 3350 17 G PO PACK
17.0000 g | PACK | Freq: Every day | ORAL | Status: DC
  Administered 2014-09-01 – 2014-09-12 (×9): 17 g via ORAL
  Filled 2014-09-01 (×12): qty 1

## 2014-09-01 MED ORDER — WARFARIN SODIUM 2 MG PO TABS: 2.0000 mg | ORAL_TABLET | ORAL | Status: DC

## 2014-09-01 MED ORDER — ACETAMINOPHEN 325 MG PO TABS
650.0000 mg | ORAL_TABLET | ORAL | Status: DC | PRN
Start: 2014-09-01 — End: 2014-09-12

## 2014-09-01 MED ORDER — AMIODARONE HCL 200 MG PO TABS
200.0000 mg | ORAL_TABLET | Freq: Every day | ORAL | Status: DC
  Administered 2014-09-01 – 2014-09-12 (×12): 200 mg via ORAL
  Filled 2014-09-01 (×13): qty 1

## 2014-09-01 NOTE — ED Notes (Signed)
Cardiology and admitting MD at bedside.

## 2014-09-01 NOTE — Care Management Note (Addendum)
    Page 1 of 2   09/12/2014     4:59:06 PM CARE MANAGEMENT NOTE 09/12/2014  Patient:  Tyler Christensen, Tyler Christensen   Account Number:  [id number]  Date Initiated:  09/01/2014  Documentation initiated by:  MAYO,HENRIETTA  Subjective/Objective Assessment:   dx chest pain/hyperkalemia/worsening cardiorenal syndrome    PCP  York Ram     Action/Plan:   Anticipated DC Date:  09/12/2014   Anticipated DC Plan:  SKILLED NURSING FACILITY  In-house referral  Clinical Social Worker      DC Planning Services  CM consult      [facility]  Resumption Of Svcs/PTA Provider   Choice offered to / List presented to:     DME arranged  IV PUMP/EQUIPMENT  [facility]        HH arranged  HH-1 RN  [place].   Status of service:  Completed, signed off Medicare Important Message given?  YES (If response is "NO", the following Medicare IM given date fields will be blank) Date Medicare IM given:  09/03/2014 Medicare IM given by:  Derita Michelsen Date Additional Medicare IM given:  09/11/2014 Additional Medicare IM given by:  Ellan Lambert  Discharge Disposition:  [place]  Per UR Regulation:  Reviewed for med. necessity/level of care/duration of stay  If discussed at [place] of Stay Meetings, dates discussed:   09/09/2014  09/12/2014  09/11/2014    Comments:  09/12/14 Ellan Lambert, RN, BSN [phone] Pt refusing to go to SNF.  CHF NP, CSW and case manager met with pt, and he is adamant that he is going home.  He states that his wife is quitting her job and going to stay home with him.  [facility] to resume [facility] services as prior to admission; pt connected to milrinone pump prior to dc.  09/09/14 Ellan Lambert, RN, BSN [phone] MD spoke with pt and family last PM; they are agreeable to SNF at [facility] with Milrinone drip.  CSW updated on dc plan.  Tentative dc date 3/24, per MD.  Will follow.  09/04/14 Ellan Lambert, RN, BSN  [phone] Pt seems to be declining since dc of Levophed.  BPs in the 80s; Pt with increased SOB and lasix on hold.  CSW cont to follow for poss placement, if needed, either to SNF or residential Hospice.  Will cont to follow.  09/01/14 1003 Morton MSN BSN CCM Pt is active with [place] for milrinone gtt, has home O2 through [facility].

## 2014-09-01 NOTE — ED Notes (Signed)
Attempted report X1

## 2014-09-01 NOTE — H&P (Signed)
Admit date: 08/31/2014 Referring Physician: Dr. Zenia Resides Primary Cardiologist:  Pierre Bali, MD Chief complaint/reason for admission:Acute CHF  HPI: Tyler Christensen is a 81 y.o. male [name] (non-english speaking) w/ PMHx significant for chronic systolic CHF (EF 71%), NICM, LBBB, PAF (on coumadin), HTN, HLD, CVA, Pulmonary Hypertension and h/o seizure d/o (2/2 CVA 05/2012).   Admitted to [facility] 06/2012 with acute decompensated heart failure EF 20%. As noted below, also diagnosed with severe pulmonary hypertension (PAPs ~ 90) from initial RHC and placed on Milrinone and Revatio (eventually transitioned to tadalafil 40) with marked clinical response and decrease in PA pressures. VQ low probability for pulmonary embolus. Discharged on Milrinone at 0.375 mcg via PICC. Discharge Weight 146 pounds. Attempted to wean milrinone in 9/14 but failed due to worsening HF and PAH. Milrinone restarted.   Admitted to the hospital 5/21-11/22/2013. Found to have staph aureus bacteremia treated with IV abx. TEE negative for endocarditis. Also found to retroperitoneal mass and biopsy revealed benign lesion. He had evidence of low output thought to be due to atrial fibrillation and milrinone increased. Attempted TEE and DC-CV but had LAA clot so deferred cardioversion.   He was admitted in 12/15 with acute on chronic systolic CHF. He was diuresed with IV Lasix and milrinone was increased to 0.5 mcg/kg/min.   Admitted 1/15 with abdominal distension and pain. Thought to be due to constipation. 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 HF was stable with normal co-ox during admit. Treated with sorbitol.   Admitted again for volume overload and ab pain in 07/2014. Diuresed and given sorbitol. Discharge weight 140 lbs  Admitted 3/1-08/23/2014 for heart failure, discharge weight 135 pounds.  Seen in CHF clinic 08/28/2014. HHRN was concerned and said he was not  taking medicines routinely. He refused hospice. His weight was 140 lb, 6.4 ounces. He is a DO NOT RESUSCITATE but not interested in palliative care or hospice. The plan was to give him Lasix 80 mg IV once a week on Wednesdays with potassium to help maintain volume status. Not on ace/ARB due to renal function. Not on beta blockers due to low output state and milrinone. Plan was for him to get TED hose placed, apply for a motorized scooter, and consideration is to be given for starting Reglan 10 mg twice a day for GI issues. He is also to get a referral to GI for a gastric emptying study.  08/29/2014 he developed chest pain and shortness of breath and came to the emergency room.  His BNP was elevated and CXR with edema. He was given Lasix 80 mg IV at 6 am, and was for another 80 mg at 10 am. The second dose was held due to [name] 92. He has refused SNF or Hospice and he has continued to refuse.  He was brought in for observation and started on Reglan for nausea.  Earlier this am he was seen by Dr. Johnsie Cancel and was still complaining of SOB but felt to be at baseline and patient requested to go home.  He again refused palliative care or SNF and he was discharged home.  He started having more chest pain and worsening SOB and came back to the ER tonight.  He is mildly tachypneic on exam.  He is hypotensive on exam with BP 97/52 - 114/53mmHg.  He was initially tachycardic but HR now 70's.  He was found to have hperkalemia with potassium 6.2 and worsening creatinine of 2.08.  BNP increased from earlier at 3926 but chest xray showed partial clearing of edema.  INR supratherapeutic.        PMH:    Past Medical History  Diagnosis Date  . HTN (hypertension)   . High cholesterol   . CHF (congestive heart failure)   . BBBB (bilateral bundle branch block)   . LBBB (left bundle branch block)   . Seizures 06/11/2012    new onset/notes (06/11/2012)  . SOB (shortness of breath)     "sometimes when I lay down;; related to  not taking my RX" (06/11/2012)  . Myocardial infarction     06/10/2012  . Atrial fibrillation   . Stroke   . Atrial thrombus     left  . Pulmonary HTN   . Atrial fibrillation 06/12/2012  . Coronary artery disease     PSH:    Past Surgical History  Procedure Laterality Date  . Throat surgery  1942    "and nose" (06/11/2012); ?T&A  . Inguinal hernia repair  ~ 2008    "both sides" (06/11/2012)  . Tee without cardioversion  06/29/2012    Procedure: TRANSESOPHAGEAL ECHOCARDIOGRAM (TEE);  Surgeon: Jolaine Artist, MD;  Location: [facility] ENDOSCOPY;   . Cardioversion N/A 08/14/2012    Procedure: CARDIOVERSION;  Surgeon: Jolaine Artist, MD;  Location: [facility] ENDOSCOPY;  . Tee without cardioversion N/A 11/12/2013    Procedure: TRANSESOPHAGEAL ECHOCARDIOGRAM (TEE);  Surgeon: Candee Furbish, MD;  Location: [facility] ENDOSCOPY;   . Tee without cardioversion N/A 11/19/2013    Procedure: TRANSESOPHAGEAL ECHOCARDIOGRAM (TEE);  Surgeon: Jolaine Artist, MD;  Location: [facility] ENDOSCOPY;   . Cardioversion N/A 12/27/2013    Procedure: CARDIOVERSION;  Surgeon: Jolaine Artist, MD;  Location: [facility] ENDOSCOPY;   . Left and right heart catheterization with coronary angiogram N/A 07/02/2012    Procedure: LEFT AND RIGHT HEART CATHETERIZATION WITH CORONARY ANGIOGRAM;  Surgeon: Jolaine Artist, MD;  Location: [place] CATH LAB; Mild non-obstructive CAD, severe PAH, PA = 93/42 (59), severe NICM w/ EF 15%  . Right heart catheterization N/A 07/06/2012    Procedure: RIGHT HEART CATH;  Surgeon: Jolaine Artist, MD; to assess response to milrinone; Moderate PH with profound reduction in PVR with milrinone/PDE-5 inhibitor   . Bi-ventricular implantable cardioverter defibrillator N/A 10/26/2012    Procedure: BI-VENTRICULAR IMPLANTABLE CARDIOVERTER DEFIBRILLATOR  (CRT-D);  Surgeon: Evans Lance, MD;  Medtronic Evera XT CRTD, BiV ICD serial #[id number] H      ALLERGIES:   Lisinopril  Prior to Admit Meds:   (Not in a hospital  admission) Family HX:    Family History  Problem Relation Age of Onset  . Heart attack      father had MI in his 69s, no fhx of early heart problem before age 14  . Heart attack      mother died of MI around 28   Social HX:    History   Social History  . Marital Status: Divorced    Spouse Name: N/A  . Number of Children: N/A  . Years of Education: N/A   Occupational History  . Retired    Social History Main Topics  . Smoking status: Never Smoker   . Smokeless tobacco: Never Used  . Alcohol Use: No  . Drug Use: No  . Sexual Activity: No   Other Topics Concern  . Not on file   Social History Narrative   [name], Spanish only (few words of English). Lives with wife.  ROS:  All 11 ROS were addressed and are negative except what is stated in the HPI  PHYSICAL EXAM Filed Vitals:   09/01/14 0030  BP: 103/54  Pulse: 76  Resp: 20   General: Well developed, well nourished, in no acute distress Head: Eyes PERRLA, No xanthomas.   Normal cephalic and atramatic  Lungs:   Crackles at bases. Heart:   HRRR S1 S2 Pulses are 2+ & equal.            No carotid bruit. No JVD.  No abdominal bruits. No femoral bruits. Abdomen: protruberant abdomen, soft, NT Extremities:   No clubbing, cyanosis.  DP +1.  2+ edema LE bilaterally Neuro: Alert and oriented X 3. Psych:  Good affect, responds appropriately   Labs:   Lab Results  Component Value Date   WBC 15.7* 08/31/2014   HGB 10.6* 08/31/2014   HCT 31.9* 08/31/2014   MCV 79.4 08/31/2014   PLT 393 08/31/2014    Recent Labs Lab 08/31/14 2128  NA 123*  K 6.2*  CL 87*  CO2 24  BUN 58*  CREATININE 2.08*  CALCIUM 9.0  GLUCOSE 211*   Lab Results  Component Value Date   TROPONINI 0.03 08/31/2014   No results found for: PTT Lab Results  Component Value Date   INR 4.31* 08/31/2014   INR 3.32* 08/30/2014   INR 2.79* 08/29/2014     Lab Results  Component Value Date   CHOL 128 06/12/2012   Lab Results   Component Value Date   HDL 27* 06/12/2012   Lab Results  Component Value Date   LDLCALC 78 06/12/2012   Lab Results  Component Value Date   TRIG 113 06/12/2012   Lab Results  Component Value Date   CHOLHDL 4.7 06/12/2012   No results found for: LDLDIRECT    Radiology:  Dg Chest Portable 1 View  08/31/2014   CLINICAL DATA:  Chest pain  EXAM: PORTABLE CHEST - 1 VIEW  COMPARISON:  08/29/2014  FINDINGS: Right jugular central line extends to the right atrium. There are intact appearances of the transvenous leads. There is unchanged cardiomegaly and aortic tortuosity. There is partial clearance of alveolar edema compared to the prior study.  IMPRESSION: Improved, with partial clearance of edema since 08/29/2014. Unchanged cardiomegaly.   Electronically Signed   By: Andreas Newport M.D.   On: 08/31/2014 23:14    EKG:  V paced rhythm with widened QRS   ASSESSMENT/PLAN:  1.  Chest pain with negative troponin - most likely secondary to worsening CHF 2.  Acute on chronic end stage CHF on chronic milrinone therapy with multiple readmissions recently for decompensated CHF despite high dose Milrinone and HHRN support.  He has been resistant to Hospice or SNF despite multiple discussions by [facility] team.  His renal function is deteriorating and he has now developed hyperkalemia, c/w worsening cardiorenal syndrome.  Not much more to offer.  We will place him in CCU and continue Milrinone at current rate.  Will start low dose Levophed and diurese with IV Lasix.  I discussed with patient via an interpreter that his health is deteriorating further and there is not much more to offer.  He states that  He is willing to consider Hospice care at this point.  This will be addressed by [facility] team in am.  Will give him Morphine PRN to make comfortable.  He is DNR.   3. Retroperitoneal lipsarcoma 4.  Medical noncompliance with meds due to  weakness and poor memory issues 5.  Severe pulmonary HTN 6.   Acute on chronic renal failure secondary to worsening cardiorenal syndrome 7.  Hyperkalemia with widening of QRS on EKG.  Treated with Calcium/Insulin/D50/Kaexylate.   Stop reglan due to QT prolongation and stop aldactone and potassium due to hyperkalemia 8.  Persistent atrial fibrillation rate controlled on chronic systemic anticoagulation with supratherapeutic INR.  Pharmacy to manage 9.  Hyponatremia secondary to # 2    Sueanne Margarita, MD  09/01/2014  12:47 AM

## 2014-09-01 NOTE — Progress Notes (Signed)
   81 y/o male with end-stage systolic HF and pulmonary HTN. Has had numerous admissions recently for decompensated HF despite high-dose milrinone at 0.60mcg/kg/min. Was discharged yesterday afternoon and readmitted early this am with recurrent HF, hypotension, renal failure and hypokalemia. Placed on levophed and now feels some better with improved diuresis. He has refused Hospice or SNF placement in past despite numerous discussions about EOL issues.    I reviewed all notes from last night, examined him and had a long talk with him through an interpreter Ellwood Sayers, PharmD) who knows his case well. I explained that high-dose milrinone is no longer sufficient to maintain him and he has only improved due to levophed. I explained that this was not a medicine we could get for him outside of an ICU environment and that he was approaching the end of his life. He was clearly not ready to except this although he does realizes that he is dying from his HF.   We discussed options of:  1) Sending patient to [facility] on current dose of milrinone 2) Weaning levophed in house with plan for comfort care measures if he deteriorates on milrinone alone.   He prefers the latter.   We wil start levophed wean tonight and stop in am. If becomes symptomatic will start fentanyl and versed gtts.  I will ask that his family come to the hospital so we can speak to them directly as well.  The patient is critically ill with multiple organ systems failure and requires high complexity decision making for assessment and support, frequent evaluation and titration of therapies, application of advanced monitoring technologies and extensive interpretation of multiple databases.   Critical Care Time devoted to patient care services described in this note is 45 Minutes.    Daniel Bensimhon,MD 6:11 PM

## 2014-09-01 NOTE — Progress Notes (Signed)
eLink Physician-Brief Progress Note Patient Name: Tyler Christensen DOB: 1933/03/09 MRN: [id number]   Date of Service  09/01/2014  HPI/Events of Note  EF 20%, severe PH - on home milrinone CKD with cardiorenal syndrome hyperkalemia  eICU Interventions  potassium & aldactone stopped DNR noted   New ICU patient evaluation: This patient was evaluated by the [facility] team. I have reviewed relevant documentation including care plan & orders.    Intervention Category Evaluation Type: New Patient Evaluation  ALVA,RAKESH V. 09/01/2014, 2:47 AM

## 2014-09-01 NOTE — Progress Notes (Addendum)
ANTICOAGULATION CONSULT NOTE - Initial Consult  Pharmacy Consult for Coumadin Indication: atrial fibrillation and h/o atrial thrombus  Allergies  Allergen Reactions  . Lisinopril Rash    Patient Measurements: Weight: 152 lb 11 oz (69.259 kg)  Vital Signs: Temp: 97.6 F (36.4 C) (03/13 1546) Temp Source: Oral (03/13 1546) BP: 103/54 mmHg (03/14 0030) Pulse Rate: 76 (03/14 0030)  Labs:  Recent Labs  08/29/14 0429  08/29/14 2029 08/30/14 0500 08/31/14 0438 08/31/14 2128  HGB 10.2*  --   --   --   --  10.6*  HCT 31.1*  --   --   --   --  31.9*  PLT 369  --   --   --   --  393  LABPROT 29.6*  --   --  33.9* 41.6*  --   INR 2.79*  --   --  3.32* 4.31*  --   CREATININE 1.81*  --   --   --   --  2.08*  TROPONINI  --   < > 0.03 0.05*  --  0.03  < > = values in this interval not displayed.  Estimated Creatinine Clearance: 27.9 mL/min (by C-G formula based on Cr of 2.08).   Medical History: Past Medical History  Diagnosis Date  . HTN (hypertension)   . High cholesterol   . CHF (congestive heart failure)   . BBBB (bilateral bundle branch block)   . LBBB (left bundle branch block)   . Seizures 06/11/2012    new onset/notes (06/11/2012)  . SOB (shortness of breath)     "sometimes when I lay down;; related to not taking my RX" (06/11/2012)  . Myocardial infarction     06/10/2012  . Atrial fibrillation   . Stroke   . Atrial thrombus     left  . Pulmonary HTN   . Atrial fibrillation 06/12/2012  . Coronary artery disease      Assessment: 81yo male c/o CP and worsening SOB, was discharged 3/13 after stay for same, to continue Coumadin; current INR therapeutic.  Goal of Therapy:  INR 2-3   Plan:  Will continue home Coumadin dose of 4mg  MWFSun and 2mg  TTSat and monitor INR.  Wynona Neat, PharmD, BCPS  09/01/2014,12:44 AM  Addendum INR 4 > than goal  Hold coumadin for now  Bonnita Nasuti Pharm.D. CPP, BCPS Clinical Pharmacist [phone] 09/01/2014 11:48  AM

## 2014-09-02 ENCOUNTER — Encounter: Payer: Medicare Other | Admitting: Internal Medicine

## 2014-09-02 ENCOUNTER — Inpatient Hospital Stay (HOSPITAL_COMMUNITY): Payer: Medicare Other

## 2014-09-02 DIAGNOSIS — N179 Acute kidney failure, unspecified: Secondary | ICD-10-CM

## 2014-09-02 DIAGNOSIS — N189 Chronic kidney disease, unspecified: Secondary | ICD-10-CM

## 2014-09-02 DIAGNOSIS — R1013 Epigastric pain: Secondary | ICD-10-CM

## 2014-09-02 LAB — PROTIME-INR
INR: 2.72 — AB (ref 0.00–1.49)
PROTHROMBIN TIME: 29.1 s — AB (ref 11.6–15.2)

## 2014-09-02 LAB — BASIC METABOLIC PANEL
ANION GAP: 6 (ref 5–15)
BUN: 43 mg/dL — ABNORMAL HIGH (ref 6–23)
CALCIUM: 8.2 mg/dL — AB (ref 8.4–10.5)
CO2: 33 mmol/L — ABNORMAL HIGH (ref 19–32)
CREATININE: 1.35 mg/dL (ref 0.50–1.35)
Chloride: 86 mmol/L — ABNORMAL LOW (ref 96–112)
GFR calc Af Amer: 56 mL/min — ABNORMAL LOW (ref >90)
GFR calc non Af Amer: 48 mL/min — ABNORMAL LOW (ref >90)
Glucose, Bld: 232 mg/dL — ABNORMAL HIGH (ref 70–99)
Potassium: 3.5 mmol/L (ref 3.5–5.1)
Sodium: 125 mmol/L — ABNORMAL LOW (ref 135–145)

## 2014-09-02 LAB — AMYLASE: AMYLASE: 56 U/L (ref 0–105)

## 2014-09-02 LAB — LIPASE, BLOOD: Lipase: 30 U/L (ref 11–59)

## 2014-09-02 MED ORDER — WARFARIN SODIUM 2 MG PO TABS
2.0000 mg | ORAL_TABLET | Freq: Once | ORAL | Status: AC
  Administered 2014-09-02: 2 mg via ORAL
  Filled 2014-09-02: qty 1

## 2014-09-02 MED ORDER — CHLORHEXIDINE GLUCONATE CLOTH 2 % EX PADS: 6.0000 | MEDICATED_PAD | Freq: Every day | CUTANEOUS | Status: DC

## 2014-09-02 MED ORDER — POTASSIUM CHLORIDE CRYS ER 20 MEQ PO TBCR
20.0000 meq | EXTENDED_RELEASE_TABLET | Freq: Once | ORAL | Status: AC
  Administered 2014-09-02: 20 meq via ORAL
  Filled 2014-09-02: qty 1

## 2014-09-02 MED ORDER — METOCLOPRAMIDE HCL 10 MG PO TABS
20.0000 mg | ORAL_TABLET | Freq: Two times a day (BID) | ORAL | Status: DC
  Administered 2014-09-02 – 2014-09-12 (×21): 20 mg via ORAL
  Filled 2014-09-02 (×23): qty 2

## 2014-09-02 NOTE — Progress Notes (Signed)
ANTICOAGULATION CONSULT NOTE - Follow Up Consult  Pharmacy Consult for Coumadin Indication: Afib and h/o atrial thrombus  Allergies  Allergen Reactions  . Lisinopril Rash    Patient Measurements: Height: 5\' 8"  (172.7 cm) Weight: 139 lb 8.8 oz (63.3 kg) IBW/kg (Calculated) : 68.4  Vital Signs: Temp: 96 F (35.6 C) (03/15 1200) Temp Source: Oral (03/15 1200) BP: 90/46 mmHg (03/15 1200) Pulse Rate: 68 (03/15 1200)  Labs:  Recent Labs  08/31/14 2128 09/01/14 0050 09/01/14 0305 09/02/14 0417  HGB 10.6*  --   --   --   HCT 31.9*  --   --   --   PLT 393  --   --   --   APTT  --   --  63*  --   LABPROT  --  41.6* 39.1* 29.1*  INR  --  4.30* 3.97* 2.72*  CREATININE 2.08*  --  1.88* 1.35  TROPONINI 0.03  --   --   --     Estimated Creatinine Clearance: 39.7 mL/min (by C-G formula based on Cr of 1.35).    Assessment: 81yo male presenting to ED evening of 08/30/13 c/o CP and worsening SOB. Discharged earlier that same day for decompensated HF, multiple admissions for the same despite high-dose home milrinone.  INR supratherapeutic at 4.3, warfarin held >> now therapeutic at 2.72.  CBC low but stable, PLT WNL, no bleeding noted.  PTA Coumadin 2mg  TThSat, 4mg  all other days.  Goal of Therapy:  INR 2-3 Monitor platelets by anticoagulation protocol: Yes   Plan:  - Resume warfarin today - 2mg  x1 - Monitor daily INR, CBC, s/sx of bleeding  Drucie Opitz, PharmD Clinical Pharmacy Resident Pager: [phone] 09/02/2014 1:57 PM

## 2014-09-02 NOTE — Progress Notes (Signed)
Advanced Heart Failure Rounding Note   Subjective:    Feels better. Denies dyspnea.  Levophed down to 2 mcg. Urine output good off lasix. Weight and renal function back to baseline.   Still with ab pain.   Objective:   Weight Range:  Vital Signs:   Temp:  [96.1 F (35.6 C)-98.9 F (37.2 C)] 97.6 F (36.4 C) (03/15 0000) Pulse Rate:  [73-87] 75 (03/15 0800) Resp:  [13-32] 24 (03/15 0800) BP: (77-102)/(41-61) 99/47 mmHg (03/15 0800) SpO2:  [88 %-100 %] 100 % (03/15 0800) Weight:  [63.3 kg (139 lb 8.8 oz)] 63.3 kg (139 lb 8.8 oz) (03/15 0500) Last BM Date: 09/01/14  Weight change: Filed Weights   09/01/14 0224 09/01/14 0630 09/02/14 0500  Weight: 66.2 kg (145 lb 15.1 oz) 66.2 kg (145 lb 15.1 oz) 63.3 kg (139 lb 8.8 oz)    Intake/Output:   Intake/Output Summary (Last 24 hours) at 09/02/14 0824 Last data filed at 09/02/14 0800  Gross per 24 hour  Intake 1344.93 ml  Output   2555 ml  Net -1210.07 ml     Physical Exam: General: Elderly. Lying in bed. NAD.No resp difficulty;  HEENT: normal Neck: supple. JVP 6; Carotids 2+ bilaterally; no bruits. No lymphadenopathy or thryomegaly appreciated.  Cor: PMI laterally displaced. Regular rhythm and rate. + s3  Lungs: clear Abdomen: soft, nontender, mildy distended. No hepatosplenomegaly. No bruits or masses. + high-pitched bowel sounds. Extremities: no cyanosis, clubbing, rash. 1+ edema .  Neuro: alert & orientedx3, cranial nerves grossly intact. Moves all 4 extremities w/o difficulty. Affect pleasant.  Telemetry: NSR with v-pacing  Labs: Basic Metabolic Panel:  Recent Labs Lab 08/29/14 0429 08/31/14 2128 09/01/14 0305 09/02/14 0417  NA 126* 123* 125* 125*  K 3.5 6.2* 4.3 3.5  CL 88* 87* 88* 86*  CO2 26 24 28  33*  GLUCOSE 116* 211* 118* 232*  BUN 48* 58* 57* 43*  CREATININE 1.81* 2.08* 1.88* 1.35  CALCIUM 8.5 9.0 8.6 8.2*  MG  --   --  2.2  --     Liver Function Tests:  Recent Labs Lab 09/01/14 0305   AST 50*  ALT 49  ALKPHOS 288*  BILITOT 0.9  PROT 6.5  ALBUMIN 2.9*   No results for input(s): LIPASE, AMYLASE in the last 168 hours. No results for input(s): AMMONIA in the last 168 hours.  CBC:  Recent Labs Lab 08/29/14 0429 08/31/14 2128  WBC 13.8* 15.7*  NEUTROABS 11.2*  --   HGB 10.2* 10.6*  HCT 31.1* 31.9*  MCV 79.1 79.4  PLT 369 393    Cardiac Enzymes:  Recent Labs Lab 08/29/14 1526 08/29/14 2029 08/30/14 0500 08/31/14 2128  TROPONINI 0.03 0.03 0.05* 0.03    BNP: BNP (last 3 results)  Recent Labs  08/19/14 2218 08/29/14 0429 08/31/14 2128  BNP 1755.9* 1894.1* 3926.7*    ProBNP (last 3 results)  Recent Labs  03/01/14 1959 03/08/14 2230 06/05/14 0332  PROBNP 11880.0* 15334.0* 19533.0*      Other results:    Imaging: Dg Chest Portable 1 View  08/31/2014   CLINICAL DATA:  Chest pain  EXAM: PORTABLE CHEST - 1 VIEW  COMPARISON:  08/29/2014  FINDINGS: Right jugular central line extends to the right atrium. There are intact appearances of the transvenous leads. There is unchanged cardiomegaly and aortic tortuosity. There is partial clearance of alveolar edema compared to the prior study.  IMPRESSION: Improved, with partial clearance of edema since 08/29/2014. Unchanged cardiomegaly.   Electronically  Signed   By: Andreas Newport M.D.   On: 08/31/2014 23:14      Medications:     Scheduled Medications: . amiodarone  200 mg Oral Daily  . Chlorhexidine Gluconate Cloth  6 each Topical Daily  . magnesium oxide  400 mg Oral BID  . mupirocin ointment  1 application Nasal BID  . polyethylene glycol  17 g Oral Daily  . sildenafil  20 mg Oral TID  . sodium chloride  3 mL Intravenous Q12H  . sorbitol  30 mL Oral Daily  . Warfarin - Pharmacist Dosing Inpatient   Does not apply q1800     Infusions: . milrinone 0.5 mcg/kg/min (09/02/14 0800)  . norepinephrine (LEVOPHED) Adult infusion 2 mcg/min (09/02/14 0800)     PRN Medications:  sodium  chloride, acetaminophen, acetaminophen, ondansetron (ZOFRAN) IV, sodium chloride, traMADol   Assessment:   1. Acute on hronic systolic HF (EF 86%) on home milrinone 2. Acute on CKD, stage III-IV 3. Hyperkalemia 4. PAF in NSR on amio 5. Retroperitoneal mass. 6. hyponatremia 7. PAH 8. Hypocalcemia 9. DNR   Plan/Discussion:      He is improved on levophed. Renal function better. Volume status improved. Long talk yesterday about disposition. We will stop levophed today and have him go to tele. If deteriorates off of levophed will need comfort care. If stabilizes will likely need to discuss transfer to [facility] on milrinone or [facility] but he does not want to stop milrinone. He is auto diuresing. Will keep diuretics off today. Likely resume in am.   Check KUB today. Resume Reglan at 20 bid. Despite prolonged QT. JT interval is ok.  Continue coumadin.   Length of Stay: 1  Glori Bickers MD 09/02/2014, 8:24 AM  Advanced Heart Failure Team Pager [phone] (M-F; 7a - 4p)  Please contact [facility] for night-coverage after hours (4p -7a ) and weekends on [email or web address]

## 2014-09-03 DIAGNOSIS — I480 Paroxysmal atrial fibrillation: Secondary | ICD-10-CM

## 2014-09-03 DIAGNOSIS — N183 Chronic kidney disease, stage 3 (moderate): Secondary | ICD-10-CM

## 2014-09-03 DIAGNOSIS — J9621 Acute and chronic respiratory failure with hypoxia: Secondary | ICD-10-CM

## 2014-09-03 LAB — PROTIME-INR
INR: 2.18 — ABNORMAL HIGH (ref 0.00–1.49)
Prothrombin Time: 24.5 seconds — ABNORMAL HIGH (ref 11.6–15.2)

## 2014-09-03 LAB — CBC
HCT: 29.7 % — ABNORMAL LOW (ref 39.0–52.0)
HEMOGLOBIN: 9.7 g/dL — AB (ref 13.0–17.0)
MCH: 26.1 pg (ref 26.0–34.0)
MCHC: 32.7 g/dL (ref 30.0–36.0)
MCV: 79.8 fL (ref 78.0–100.0)
Platelets: 320 10*3/uL (ref 150–400)
RBC: 3.72 MIL/uL — AB (ref 4.22–5.81)
RDW: 18.6 % — ABNORMAL HIGH (ref 11.5–15.5)
WBC: 13.2 10*3/uL — AB (ref 4.0–10.5)

## 2014-09-03 LAB — BASIC METABOLIC PANEL
Anion gap: 8 (ref 5–15)
BUN: 40 mg/dL — ABNORMAL HIGH (ref 6–23)
CHLORIDE: 89 mmol/L — AB (ref 96–112)
CO2: 28 mmol/L (ref 19–32)
CREATININE: 1.42 mg/dL — AB (ref 0.50–1.35)
Calcium: 8.7 mg/dL (ref 8.4–10.5)
GFR calc Af Amer: 53 mL/min — ABNORMAL LOW (ref >90)
GFR, EST NON AFRICAN AMERICAN: 45 mL/min — AB (ref >90)
Glucose, Bld: 129 mg/dL — ABNORMAL HIGH (ref 70–99)
Potassium: 3.8 mmol/L (ref 3.5–5.1)
Sodium: 125 mmol/L — ABNORMAL LOW (ref 135–145)

## 2014-09-03 MED ORDER — CHLORHEXIDINE GLUCONATE CLOTH 2 % EX PADS
6.0000 | MEDICATED_PAD | Freq: Every day | CUTANEOUS | Status: AC
  Administered 2014-09-03 – 2014-09-05 (×3): 6 via TOPICAL

## 2014-09-03 MED ORDER — FUROSEMIDE 10 MG/ML IJ SOLN
80.0000 mg | INTRAMUSCULAR | Status: AC
  Administered 2014-09-03 – 2014-09-04 (×2): 80 mg via INTRAVENOUS
  Filled 2014-09-03: qty 8

## 2014-09-03 MED ORDER — METOLAZONE 5 MG PO TABS
5.0000 mg | ORAL_TABLET | Freq: Once | ORAL | Status: AC
  Administered 2014-09-03: 5 mg via ORAL
  Filled 2014-09-03: qty 1

## 2014-09-03 MED ORDER — WARFARIN SODIUM 4 MG PO TABS
4.0000 mg | ORAL_TABLET | Freq: Once | ORAL | Status: AC
  Administered 2014-09-03: 4 mg via ORAL
  Filled 2014-09-03: qty 1

## 2014-09-03 MED ORDER — FUROSEMIDE 10 MG/ML IJ SOLN
80.0000 mg | Freq: Two times a day (BID) | INTRAMUSCULAR | Status: DC
  Filled 2014-09-03 (×2): qty 8

## 2014-09-03 NOTE — Progress Notes (Signed)
ANTICOAGULATION CONSULT NOTE - Follow Up Consult  Pharmacy Consult for Coumadin Indication: Afib and h/o atrial thrombus  Allergies  Allergen Reactions  . Lisinopril Rash    Patient Measurements: Height: 5\' 8"  (172.7 cm) Weight: 137 lb 2 oz (62.2 kg) IBW/kg (Calculated) : 68.4  Vital Signs: Temp: 97.7 F (36.5 C) (03/16 0629) Temp Source: Oral (03/16 0629) BP: 92/46 mmHg (03/16 0629) Pulse Rate: 74 (03/16 0629)  Labs:  Recent Labs  08/31/14 2128  09/01/14 0305 09/02/14 0417 09/03/14 0523  HGB 10.6*  --   --   --  9.7*  HCT 31.9*  --   --   --  29.7*  PLT 393  --   --   --  320  APTT  --   --  63*  --   --   LABPROT  --   < > 39.1* 29.1* 24.5*  INR  --   < > 3.97* 2.72* 2.18*  CREATININE 2.08*  --  1.88* 1.35 1.42*  TROPONINI 0.03  --   --   --   --   < > = values in this interval not displayed.  Estimated Creatinine Clearance: 37.1 mL/min (by C-G formula based on Cr of 1.42).   Assessment: 81yo male presenting to ED evening of 08/30/13 c/o CP and worsening SOB. Discharged earlier that same day for decompensated HF, multiple admissions for the same despite high-dose home milrinone.  INR supratherapeutic at 4.3, warfarin held >> warfarin resumed 3/15 when INR was therapeutic and <3 >> now remains therapeutic at 2.18.  CBC has dropped from 3 days ago, PLT WNL, no bleeding noted.  PTA Coumadin 2mg  TThSat, 4mg  all other days.  Goal of Therapy:  INR 2-3 Monitor platelets by anticoagulation protocol: Yes   Plan:  - Coumadin 4mg  x1 - Monitor daily INR, CBC, s/sx of bleeding  Drucie Opitz, PharmD Clinical Pharmacy Resident Pager: [phone] 09/03/2014 7:35 AM

## 2014-09-03 NOTE — Progress Notes (Signed)
1819 pt no acute distress . Resting comfortably in bed .Legs dangled

## 2014-09-03 NOTE — Progress Notes (Signed)
Advanced Heart Failure Rounding Note   Subjective:    BP stable 90s off levophed.  Urine output ok off diuretics. However much more SOB and orthopneic today. Feels like he is smothering   Objective:   Weight Range:  Vital Signs:   Temp:  [96 F (35.6 C)-98.2 F (36.8 C)] 97.4 F (36.3 C) (03/15 2010) Pulse Rate:  [67-85] 85 (03/15 2010) Resp:  [14-31] 18 (03/15 2010) BP: (82-103)/(45-61) 92/56 mmHg (03/15 2010) SpO2:  [88 %-100 %] 95 % (03/15 2010) Weight:  [62.959 kg (138 lb 12.8 oz)-63.3 kg (139 lb 8.8 oz)] 62.959 kg (138 lb 12.8 oz) (03/15 1500) Last BM Date: 09/01/14  Weight change: Filed Weights   09/01/14 0630 09/02/14 0500 09/02/14 1500  Weight: 66.2 kg (145 lb 15.1 oz) 63.3 kg (139 lb 8.8 oz) 62.959 kg (138 lb 12.8 oz)    Intake/Output:   Intake/Output Summary (Last 24 hours) at 09/03/14 0322 Last data filed at 09/02/14 2217  Gross per 24 hour  Intake  911.5 ml  Output   1025 ml  Net -113.5 ml     Physical Exam: General: Elderly. Sitting up in bed. NAD. +dyspnea HEENT: normal Neck: supple. JVP jaw; Carotids 2+ bilaterally; no bruits. No lymphadenopathy or thryomegaly appreciated.  Cor: PMI laterally displaced. Regular rhythm and rate. + s3  Lungs: clear Abdomen: soft, nontender, mildy distended. No hepatosplenomegaly. No bruits or masses. + high-pitched bowel sounds. Extremities: no cyanosis, clubbing, rash. 3+ edema .  Neuro: alert & orientedx3, cranial nerves grossly intact. Moves all 4 extremities w/o difficulty. Affect pleasant.  Telemetry: NSR with v-pacing  Labs: Basic Metabolic Panel:  Recent Labs Lab 08/29/14 0429 08/31/14 2128 09/01/14 0305 09/02/14 0417  NA 126* 123* 125* 125*  K 3.5 6.2* 4.3 3.5  CL 88* 87* 88* 86*  CO2 26 24 28  33*  GLUCOSE 116* 211* 118* 232*  BUN 48* 58* 57* 43*  CREATININE 1.81* 2.08* 1.88* 1.35  CALCIUM 8.5 9.0 8.6 8.2*  MG  --   --  2.2  --     Liver Function Tests:  Recent Labs Lab 09/01/14 0305   AST 50*  ALT 49  ALKPHOS 288*  BILITOT 0.9  PROT 6.5  ALBUMIN 2.9*    Recent Labs Lab 09/02/14 0417  LIPASE 30  AMYLASE 56   No results for input(s): AMMONIA in the last 168 hours.  CBC:  Recent Labs Lab 08/29/14 0429 08/31/14 2128  WBC 13.8* 15.7*  NEUTROABS 11.2*  --   HGB 10.2* 10.6*  HCT 31.1* 31.9*  MCV 79.1 79.4  PLT 369 393    Cardiac Enzymes:  Recent Labs Lab 08/29/14 1526 08/29/14 2029 08/30/14 0500 08/31/14 2128  TROPONINI 0.03 0.03 0.05* 0.03    BNP: BNP (last 3 results)  Recent Labs  08/19/14 2218 08/29/14 0429 08/31/14 2128  BNP 1755.9* 1894.1* 3926.7*    ProBNP (last 3 results)  Recent Labs  03/01/14 1959 03/08/14 2230 06/05/14 0332  PROBNP 11880.0* 15334.0* 19533.0*      Other results:    Imaging: Dg Abd Portable 1v  09/02/2014   CLINICAL DATA:  Acute abdominal pain.  EXAM: PORTABLE ABDOMEN - 1 VIEW  COMPARISON:  Abdomen and pelvis CT obtained on 07/05/2014.  FINDINGS: Borderline dilated small bowel loops. No gross free peritoneal air. Lumbar and lower thoracic spine degenerative changes. Cardiac pacer and AICD leads.  IMPRESSION: Minimal small bowel ileus or partial obstruction.   Electronically Signed   By: Percell Locus.D.  On: 09/02/2014 11:15     Medications:     Scheduled Medications: . amiodarone  200 mg Oral Daily  . Chlorhexidine Gluconate Cloth  6 each Topical Daily  . magnesium oxide  400 mg Oral BID  . metoCLOPramide  20 mg Oral BID  . mupirocin ointment  1 application Nasal BID  . polyethylene glycol  17 g Oral Daily  . sildenafil  20 mg Oral TID  . sodium chloride  3 mL Intravenous Q12H  . sorbitol  30 mL Oral Daily  . Warfarin - Pharmacist Dosing Inpatient   Does not apply q1800    Infusions: . milrinone 0.5 mcg/kg/min (09/03/14 0102)    PRN Medications: sodium chloride, acetaminophen, acetaminophen, ondansetron (ZOFRAN) IV, sodium chloride, traMADol   Assessment:   1. Acute on  chronic systolic HF (EF 01%) on home milrinone 2. Acute on CKD, stage III-IV 3. Hyperkalemia 4. PAF in NSR on amio 5. Retroperitoneal mass. 6. hyponatremia 7. PAH 8. Hypocalcemia 9. Acute on chronic respiratory failure 10. DNR   Plan/Discussion:    Much worse today with marked volume overload after being off lasix and levophed for 1 day. Will restart IV lasix and metolazone.  KUB suggestive of ileus. Continue Reglan.   Continue coumadin for AF.    Long talk earlier this week about disposition. We will continue milrinone for now. If deteriorates off of levophed will need comfort care. If stabilizes will likely need to discuss transfer to [facility] on milrinone or [facility] but he does not want to stop milrinone. I suspect this will continue to be a challenge.   Length of Stay: 2  Glori Bickers MD 09/03/2014, 3:22 AM  Advanced Heart Failure Team Pager [phone] (M-F; 7a - 4p)  Please contact [facility] for night-coverage after hours (4p -7a ) and weekends on [email or web address]

## 2014-09-03 NOTE — Progress Notes (Signed)
1600 Pt compained of Sob . O2 satuaration on room air = 90"s O2 2l mnc applied for comfort . the patient looking apprehensive . Emotional support provided ,.Seen by  Dr Haroldine Laws  With orderes . Lasix 80 mgs given @1625 

## 2014-09-04 ENCOUNTER — Other Ambulatory Visit (HOSPITAL_COMMUNITY): Payer: Self-pay

## 2014-09-04 LAB — BASIC METABOLIC PANEL
ANION GAP: 14 (ref 5–15)
BUN: 49 mg/dL — ABNORMAL HIGH (ref 6–23)
CHLORIDE: 87 mmol/L — AB (ref 96–112)
CO2: 25 mmol/L (ref 19–32)
Calcium: 8.3 mg/dL — ABNORMAL LOW (ref 8.4–10.5)
Creatinine, Ser: 1.6 mg/dL — ABNORMAL HIGH (ref 0.50–1.35)
GFR calc Af Amer: 46 mL/min — ABNORMAL LOW (ref >90)
GFR calc non Af Amer: 39 mL/min — ABNORMAL LOW (ref >90)
Glucose, Bld: 120 mg/dL — ABNORMAL HIGH (ref 70–99)
POTASSIUM: 3.4 mmol/L — AB (ref 3.5–5.1)
SODIUM: 126 mmol/L — AB (ref 135–145)

## 2014-09-04 LAB — PROTIME-INR
INR: 2.03 — ABNORMAL HIGH (ref 0.00–1.49)
PROTHROMBIN TIME: 23.1 s — AB (ref 11.6–15.2)

## 2014-09-04 LAB — GLUCOSE, CAPILLARY: Glucose-Capillary: 140 mg/dL — ABNORMAL HIGH (ref 70–99)

## 2014-09-04 MED ORDER — FUROSEMIDE 10 MG/ML IJ SOLN
20.0000 mg/h | INTRAMUSCULAR | Status: DC
  Administered 2014-09-04 – 2014-09-07 (×6): 20 mg/h via INTRAVENOUS
  Filled 2014-09-04 (×13): qty 25

## 2014-09-04 MED ORDER — MORPHINE SULFATE 2 MG/ML IJ SOLN
2.0000 mg | INTRAMUSCULAR | Status: DC | PRN
  Administered 2014-09-04 – 2014-09-06 (×3): 2 mg via INTRAVENOUS
  Filled 2014-09-04 (×3): qty 1

## 2014-09-04 MED ORDER — METOLAZONE 5 MG PO TABS
5.0000 mg | ORAL_TABLET | Freq: Once | ORAL | Status: AC
  Administered 2014-09-04: 5 mg via ORAL
  Filled 2014-09-04: qty 1

## 2014-09-04 MED ORDER — MORPHINE SULFATE 2 MG/ML IJ SOLN
1.0000 mg | Freq: Once | INTRAMUSCULAR | Status: AC
  Administered 2014-09-04: 1 mg via INTRAVENOUS
  Filled 2014-09-04: qty 1

## 2014-09-04 MED ORDER — FUROSEMIDE 10 MG/ML IJ SOLN
INTRAMUSCULAR | Status: AC
  Filled 2014-09-04: qty 12

## 2014-09-04 MED ORDER — WARFARIN VIDEO: Freq: Once | Status: DC

## 2014-09-04 MED ORDER — WARFARIN SODIUM 4 MG PO TABS
4.0000 mg | ORAL_TABLET | Freq: Once | ORAL | Status: AC
  Administered 2014-09-04: 4 mg via ORAL
  Filled 2014-09-04: qty 1

## 2014-09-04 MED ORDER — POTASSIUM CHLORIDE CRYS ER 20 MEQ PO TBCR
40.0000 meq | EXTENDED_RELEASE_TABLET | Freq: Once | ORAL | Status: AC
  Administered 2014-09-04: 40 meq via ORAL
  Filled 2014-09-04: qty 2

## 2014-09-04 MED ORDER — FUROSEMIDE 10 MG/ML IJ SOLN
120.0000 mg | Freq: Once | INTRAVENOUS | Status: AC
  Administered 2014-09-04: 120 mg via INTRAVENOUS
  Filled 2014-09-04 (×2): qty 12

## 2014-09-04 NOTE — Discharge Instructions (Signed)

## 2014-09-04 NOTE — Progress Notes (Signed)
1630 seenn by Dr bensimhon with orderscarried out

## 2014-09-04 NOTE — Progress Notes (Signed)
[place] a call and spoken with [name] Cregg re: pt's Bp  : Bps low 70 -80 . Pt sleeping in bed wakeable and appropriate. Easily goes back to sleep . No orders received . Will see him shortly.

## 2014-09-04 NOTE — Progress Notes (Signed)
ANTICOAGULATION CONSULT NOTE - Follow Up Consult  Pharmacy Consult for Coumadin Indication: Afib and h/o atrial thrombus  Allergies  Allergen Reactions  . Lisinopril Rash    Patient Measurements: Height: 5\' 8"  (172.7 cm) Weight: 145 lb 4.5 oz (65.9 kg) IBW/kg (Calculated) : 68.4  Vital Signs: Temp: 98.2 F (36.8 C) (03/17 0800) Temp Source: Oral (03/17 0800) BP: 77/28 mmHg (03/17 0847) Pulse Rate: 74 (03/17 0847)  Labs:  Recent Labs  09/02/14 0417 09/03/14 0523 09/04/14 0518  HGB  --  9.7*  --   HCT  --  29.7*  --   PLT  --  320  --   LABPROT 29.1* 24.5* 23.1*  INR 2.72* 2.18* 2.03*  CREATININE 1.35 1.42* 1.60*    Estimated Creatinine Clearance: 34.9 mL/min (by C-G formula based on Cr of 1.6).   Assessment: 81yo male presenting to ED evening of 08/30/13 c/o CP and worsening SOB. Discharged earlier that same day for decompensated HF, multiple admissions for the same despite high-dose home milrinone.  INR supratherapeutic at 4.3, warfarin held >> warfarin resumed 3/15 when INR was therapeutic and <3 >> now remains therapeutic at 2.03 but still trending down.  H&H 9.7/29.7 on 3/17, PLT WNL, no bleeding noted.  PTA Coumadin 2mg  TThSat, 4mg  all other days.  Goal of Therapy:  INR 2-3 Monitor platelets by anticoagulation protocol: Yes   Plan:  - Coumadin 4mg  x1 - Monitor daily INR, CBC, s/sx of bleeding  Drucie Opitz, PharmD Clinical Pharmacy Resident Pager: [phone] 09/04/2014 9:23 AM

## 2014-09-04 NOTE — Progress Notes (Signed)
1045 BPS=80's  Spoken with [name] Cregg ,[place]  Lasix not to be given

## 2014-09-04 NOTE — Progress Notes (Signed)
Advanced Heart Failure Rounding Note   Subjective:    Very SOB yesterday so IV lasix and metolazone restarted. Got one dose last night without much output.  Today [name] low in 70s so lasix held. Very short of breath. +orthopnea.  Creatinine 1.4->1.6  Objective:   Weight Range:  Vital Signs:   Temp:  [97 F (36.1 C)-98.6 F (37 C)] 98.4 F (36.9 C) (03/17 1330) Pulse Rate:  [70-80] 78 (03/17 1330) Resp:  [16-19] 16 (03/17 1330) BP: (77-96)/(28-59) 88/49 mmHg (03/17 1330) SpO2:  [92 %-100 %] 96 % (03/17 1330) Weight:  [65.9 kg (145 lb 4.5 oz)] 65.9 kg (145 lb 4.5 oz) (03/17 0147) Last BM Date: 09/02/14  Weight change: Filed Weights   09/02/14 1500 09/03/14 0629 09/04/14 0147  Weight: 62.959 kg (138 lb 12.8 oz) 62.2 kg (137 lb 2 oz) 65.9 kg (145 lb 4.5 oz)    Intake/Output:   Intake/Output Summary (Last 24 hours) at 09/04/14 1442 Last data filed at 09/04/14 1400  Gross per 24 hour  Intake 1514.39 ml  Output   2450 ml  Net -935.61 ml     Physical Exam: General: Elderly. Sitting up in chair. NAD. +dyspnea HEENT: normal Neck: supple. JVP jaw; Carotids 2+ bilaterally; no bruits. No lymphadenopathy or thryomegaly appreciated.  Cor: PMI laterally displaced. Regular rhythm and rate. + s3  Lungs: clear Abdomen: soft, nontender, mildy distended. No hepatosplenomegaly. No bruits or masses. + high-pitched bowel sounds. Extremities: no cyanosis, clubbing, rash. 3+ edema .  Neuro: alert & orientedx3, cranial nerves grossly intact. Moves all 4 extremities w/o difficulty. Affect pleasant.  Telemetry: NSR with v-pacing  Labs: Basic Metabolic Panel:  Recent Labs Lab 08/31/14 2128 09/01/14 0305 09/02/14 0417 09/03/14 0523 09/04/14 0518  NA 123* 125* 125* 125* 126*  K 6.2* 4.3 3.5 3.8 3.4*  CL 87* 88* 86* 89* 87*  CO2 24 28 33* 28 25  GLUCOSE 211* 118* 232* 129* 120*  BUN 58* 57* 43* 40* 49*  CREATININE 2.08* 1.88* 1.35 1.42* 1.60*  CALCIUM 9.0 8.6 8.2* 8.7 8.3*  MG   --  2.2  --   --   --     Liver Function Tests:  Recent Labs Lab 09/01/14 0305  AST 50*  ALT 49  ALKPHOS 288*  BILITOT 0.9  PROT 6.5  ALBUMIN 2.9*    Recent Labs Lab 09/02/14 0417  LIPASE 30  AMYLASE 56   No results for input(s): AMMONIA in the last 168 hours.  CBC:  Recent Labs Lab 08/29/14 0429 08/31/14 2128 09/03/14 0523  WBC 13.8* 15.7* 13.2*  NEUTROABS 11.2*  --   --   HGB 10.2* 10.6* 9.7*  HCT 31.1* 31.9* 29.7*  MCV 79.1 79.4 79.8  PLT 369 393 320    Cardiac Enzymes:  Recent Labs Lab 08/29/14 1526 08/29/14 2029 08/30/14 0500 08/31/14 2128  TROPONINI 0.03 0.03 0.05* 0.03    BNP: BNP (last 3 results)  Recent Labs  08/19/14 2218 08/29/14 0429 08/31/14 2128  BNP 1755.9* 1894.1* 3926.7*    ProBNP (last 3 results)  Recent Labs  03/01/14 1959 03/08/14 2230 06/05/14 0332  PROBNP 11880.0* 15334.0* 19533.0*      Other results:    Imaging: No results found.   Medications:     Scheduled Medications: . amiodarone  200 mg Oral Daily  . Chlorhexidine Gluconate Cloth  6 each Topical Daily  . furosemide  80 mg Intravenous Q12H  . magnesium oxide  400 mg Oral BID  .  metoCLOPramide  20 mg Oral BID  . mupirocin ointment  1 application Nasal BID  . polyethylene glycol  17 g Oral Daily  . sildenafil  20 mg Oral TID  . sodium chloride  3 mL Intravenous Q12H  . sorbitol  30 mL Oral Daily  . warfarin  4 mg Oral ONCE-1800  . warfarin   Does not apply Once  . Warfarin - Pharmacist Dosing Inpatient   Does not apply q1800    Infusions: . milrinone 0.5 mcg/kg/min (09/04/14 0642)    PRN Medications: sodium chloride, acetaminophen, acetaminophen, ondansetron (ZOFRAN) IV, sodium chloride, traMADol   Assessment:   1. Acute on chronic systolic HF (EF 70%) on home milrinone 2. Acute on CKD, stage III-IV 3. Hyperkalemia 4. PAF in NSR on amio 5. Retroperitoneal mass. 6. hyponatremia 7. PAH 8. Hypocalcemia 9. Acute on chronic  respiratory failure 10. DNR   Plan/Discussion:    Much worse again  today with marked volume overload and low BP. Will restart IV lasix (120mg  IVP now then 20/hr gtt) and metolazone. If no response will switch to comfort measures.  KUB suggestive of ileus. Continue Reglan.   Continue coumadin for AF.    Long talk earlier this week about disposition. We will continue milrinone for now. If deteriorates off of levophed will need comfort care. If stabilizes will likely need to discuss transfer to [facility] on milrinone or [facility] but he does not want to stop milrinone. I suspect this will continue to be a challenge.   Length of Stay: 3  Glori Bickers MD 09/04/2014, 2:42 PM  Advanced Heart Failure Team Pager [phone] (M-F; [place])  Please contact [facility] for night-coverage after hours (4p -7a ) and weekends on [email or web address]

## 2014-09-04 NOTE — Progress Notes (Signed)
1500 Pr requested for RN  To stay in the room .Obliged 1600 Pt prefer to sit by the doorway . On recliner chair Request done .Emotional support provided

## 2014-09-05 LAB — PROTIME-INR
INR: 1.93 — ABNORMAL HIGH (ref 0.00–1.49)
Prothrombin Time: 22.2 seconds — ABNORMAL HIGH (ref 11.6–15.2)

## 2014-09-05 LAB — BASIC METABOLIC PANEL
Anion gap: 10 (ref 5–15)
BUN: 51 mg/dL — AB (ref 6–23)
CALCIUM: 8.8 mg/dL (ref 8.4–10.5)
CO2: 33 mmol/L — ABNORMAL HIGH (ref 19–32)
CREATININE: 1.52 mg/dL — AB (ref 0.50–1.35)
Chloride: 85 mmol/L — ABNORMAL LOW (ref 96–112)
GFR calc Af Amer: 48 mL/min — ABNORMAL LOW (ref >90)
GFR, EST NON AFRICAN AMERICAN: 42 mL/min — AB (ref >90)
GLUCOSE: 118 mg/dL — AB (ref 70–99)
POTASSIUM: 3.1 mmol/L — AB (ref 3.5–5.1)
Sodium: 128 mmol/L — ABNORMAL LOW (ref 135–145)

## 2014-09-05 LAB — GLUCOSE, CAPILLARY: GLUCOSE-CAPILLARY: 119 mg/dL — AB (ref 70–99)

## 2014-09-05 MED ORDER — POTASSIUM CHLORIDE CRYS ER 20 MEQ PO TBCR
40.0000 meq | EXTENDED_RELEASE_TABLET | Freq: Once | ORAL | Status: AC
  Administered 2014-09-05: 40 meq via ORAL
  Filled 2014-09-05: qty 2

## 2014-09-05 MED ORDER — WARFARIN SODIUM 6 MG PO TABS
6.0000 mg | ORAL_TABLET | Freq: Once | ORAL | Status: AC
  Administered 2014-09-05: 6 mg via ORAL
  Filled 2014-09-05: qty 1

## 2014-09-05 NOTE — Progress Notes (Signed)
Advanced Heart Failure Rounding Note   Subjective:    Lasix gtt at 20 started last night. Feels much better. Remains on milrinone 0.5. Getting intermittent morphine for comfort.   Objective:   Weight Range:  Vital Signs:   Temp:  [97.7 F (36.5 C)-98.6 F (37 C)] 97.7 F (36.5 C) (03/18 1350) Pulse Rate:  [68-79] 74 (03/18 1350) Resp:  [16-20] 20 (03/18 1350) BP: (88-97)/(39-50) 92/44 mmHg (03/18 1350) SpO2:  [92 %-100 %] 96 % (03/18 1350) Weight:  [64.093 kg (141 lb 4.8 oz)] 64.093 kg (141 lb 4.8 oz) (03/18 0645) Last BM Date: 09/04/14  Weight change: Filed Weights   09/03/14 0629 09/04/14 0147 09/05/14 0645  Weight: 62.2 kg (137 lb 2 oz) 65.9 kg (145 lb 4.5 oz) 64.093 kg (141 lb 4.8 oz)    Intake/Output:   Intake/Output Summary (Last 24 hours) at 09/05/14 1517 Last data filed at 09/05/14 1350  Gross per 24 hour  Intake   1110 ml  Output   3450 ml  Net  -2340 ml     Physical Exam: General: Elderly. Sitting up in chair. NAD. +dyspnea HEENT: normal Neck: supple. JVP jaw; Carotids 2+ bilaterally; no bruits. No lymphadenopathy or thryomegaly appreciated.  Cor: PMI laterally displaced. Regular rhythm and rate. + s3  Lungs: clear Abdomen: soft, nontender, mildy distended. No hepatosplenomegaly. No bruits or masses. + high-pitched bowel sounds. Extremities: no cyanosis, clubbing, rash. 3+ edema .  Neuro: alert & orientedx3, cranial nerves grossly intact. Moves all 4 extremities w/o difficulty. Affect pleasant.  Telemetry: NSR with v-pacing  Labs: Basic Metabolic Panel:  Recent Labs Lab 09/01/14 0305 09/02/14 0417 09/03/14 0523 09/04/14 0518 09/05/14 0435  NA 125* 125* 125* 126* 128*  K 4.3 3.5 3.8 3.4* 3.1*  CL 88* 86* 89* 87* 85*  CO2 28 33* 28 25 33*  GLUCOSE 118* 232* 129* 120* 118*  BUN 57* 43* 40* 49* 51*  CREATININE 1.88* 1.35 1.42* 1.60* 1.52*  CALCIUM 8.6 8.2* 8.7 8.3* 8.8  MG 2.2  --   --   --   --     Liver Function Tests:  Recent  Labs Lab 09/01/14 0305  AST 50*  ALT 49  ALKPHOS 288*  BILITOT 0.9  PROT 6.5  ALBUMIN 2.9*    Recent Labs Lab 09/02/14 0417  LIPASE 30  AMYLASE 56   No results for input(s): AMMONIA in the last 168 hours.  CBC:  Recent Labs Lab 08/31/14 2128 09/03/14 0523  WBC 15.7* 13.2*  HGB 10.6* 9.7*  HCT 31.9* 29.7*  MCV 79.4 79.8  PLT 393 320    Cardiac Enzymes:  Recent Labs Lab 08/29/14 1526 08/29/14 2029 08/30/14 0500 08/31/14 2128  TROPONINI 0.03 0.03 0.05* 0.03    BNP: BNP (last 3 results)  Recent Labs  08/19/14 2218 08/29/14 0429 08/31/14 2128  BNP 1755.9* 1894.1* 3926.7*    ProBNP (last 3 results)  Recent Labs  03/01/14 1959 03/08/14 2230 06/05/14 0332  PROBNP 11880.0* 15334.0* 19533.0*      Other results:    Imaging: No results found.   Medications:     Scheduled Medications: . amiodarone  200 mg Oral Daily  . Chlorhexidine Gluconate Cloth  6 each Topical Daily  . magnesium oxide  400 mg Oral BID  . metoCLOPramide  20 mg Oral BID  . mupirocin ointment  1 application Nasal BID  . polyethylene glycol  17 g Oral Daily  . potassium chloride  40 mEq Oral Once  .  sildenafil  20 mg Oral TID  . sodium chloride  3 mL Intravenous Q12H  . sorbitol  30 mL Oral Daily  . warfarin  6 mg Oral ONCE-1800  . warfarin   Does not apply Once  . Warfarin - Pharmacist Dosing Inpatient   Does not apply q1800    Infusions: . furosemide (LASIX) infusion 20 mg/hr (09/05/14 1019)  . milrinone 0.5 mcg/kg/min (09/05/14 1306)    PRN Medications: sodium chloride, acetaminophen, acetaminophen, morphine injection, ondansetron (ZOFRAN) IV, sodium chloride, traMADol   Assessment:   1. Acute on chronic systolic HF (EF 41%) on home milrinone 2. Acute on CKD, stage III-IV 3. Hyperkalemia 4. PAF in NSR on amio 5. Retroperitoneal mass. 6. hyponatremia 7. PAH 8. Hypocalcemia 9. Acute on chronic respiratory failure 10. DNR   Plan/Discussion:     Remains on milrinone. Feels better with lasix gtt but volume status remains elevated. Has required levophed earlier this admit to maintain output. Will continue attempts at diuresis. If decompensated will need to switch to comfort care.   Continue coumadin for AF.    Long talk earlier this week about disposition. We will continue milrinone for now. If deteriorates off of levophed will need comfort care. If stabilizes will likely need to discuss transfer to [facility] on milrinone or [facility] but he does not want to stop milrinone. I suspect this will continue to be a challenge.   Length of Stay: 4  Glori Bickers MD 09/05/2014, 3:17 PM  Advanced Heart Failure Team Pager [phone] (M-F; [place])  Please contact [facility] for night-coverage after hours (4p -7a ) and weekends on [email or web address]

## 2014-09-05 NOTE — Progress Notes (Signed)
ANTICOAGULATION CONSULT NOTE - Follow Up Consult  Pharmacy Consult for Coumadin Indication: Afib and h/o atrial thrombus  Allergies  Allergen Reactions  . Lisinopril Rash    Patient Measurements: Height: 5\' 8"  (172.7 cm) Weight: 141 lb 4.8 oz (64.093 kg) (c scale) IBW/kg (Calculated) : 68.4  Vital Signs: Temp: 97.7 F (36.5 C) (03/18 1350) Temp Source: Axillary (03/18 1350) BP: 92/44 mmHg (03/18 1350) Pulse Rate: 74 (03/18 1350)  Labs:  Recent Labs  09/03/14 0523 09/04/14 0518 09/05/14 0435  HGB 9.7*  --   --   HCT 29.7*  --   --   PLT 320  --   --   LABPROT 24.5* 23.1* 22.2*  INR 2.18* 2.03* 1.93*  CREATININE 1.42* 1.60* 1.52*    Estimated Creatinine Clearance: 35.7 mL/min (by C-G formula based on Cr of 1.52).   Assessment: 81yo male presenting to ED evening of 08/30/13 c/o CP and worsening SOB. Discharged earlier that same day for decompensated HF, multiple admissions for the same despite high-dose home milrinone.  INR supratherapeutic at 4.3, warfarin held >> warfarin resumed 3/15 when INR was therapeutic and <3 >> now SUBtherapeutic at 1.93 with continued trend down. Some of this trend may be related to his increased PO intake. H&H 9.7/29.7 on 3/17, PLT WNL, no bleeding noted.  PTA Coumadin 2mg  TThSat, 4mg  all other days.  Goal of Therapy:  INR 2-3 Monitor platelets by anticoagulation protocol: Yes   Plan:  - Coumadin 6 mg po x1 tonight - Monitor daily INR, CBC, s/sx of bleeding  Dejah Droessler K. [name], PharmD, [place] Clinical Pharmacist - Resident Pager: [phone] Pharmacy: [phone] 09/05/2014 2:25 PM

## 2014-09-06 LAB — CBC
HEMATOCRIT: 28.8 % — AB (ref 39.0–52.0)
Hemoglobin: 9.5 g/dL — ABNORMAL LOW (ref 13.0–17.0)
MCH: 26 pg (ref 26.0–34.0)
MCHC: 33 g/dL (ref 30.0–36.0)
MCV: 78.9 fL (ref 78.0–100.0)
Platelets: 323 10*3/uL (ref 150–400)
RBC: 3.65 MIL/uL — AB (ref 4.22–5.81)
RDW: 18.6 % — AB (ref 11.5–15.5)
WBC: 12.4 10*3/uL — AB (ref 4.0–10.5)

## 2014-09-06 LAB — BASIC METABOLIC PANEL
ANION GAP: 12 (ref 5–15)
BUN: 55 mg/dL — ABNORMAL HIGH (ref 6–23)
CALCIUM: 8.7 mg/dL (ref 8.4–10.5)
CO2: 34 mmol/L — ABNORMAL HIGH (ref 19–32)
Chloride: 80 mmol/L — ABNORMAL LOW (ref 96–112)
Creatinine, Ser: 1.58 mg/dL — ABNORMAL HIGH (ref 0.50–1.35)
GFR, EST AFRICAN AMERICAN: 46 mL/min — AB (ref >90)
GFR, EST NON AFRICAN AMERICAN: 40 mL/min — AB (ref >90)
GLUCOSE: 112 mg/dL — AB (ref 70–99)
Potassium: 2.6 mmol/L — CL (ref 3.5–5.1)
SODIUM: 126 mmol/L — AB (ref 135–145)

## 2014-09-06 LAB — PROTIME-INR
INR: 2.34 — ABNORMAL HIGH (ref 0.00–1.49)
PROTHROMBIN TIME: 25.9 s — AB (ref 11.6–15.2)

## 2014-09-06 MED ORDER — DIPHENHYDRAMINE HCL 25 MG PO CAPS
25.0000 mg | ORAL_CAPSULE | Freq: Three times a day (TID) | ORAL | Status: DC | PRN
  Administered 2014-09-06 – 2014-09-09 (×2): 25 mg via ORAL
  Filled 2014-09-06 (×2): qty 1

## 2014-09-06 MED ORDER — POTASSIUM CHLORIDE CRYS ER 20 MEQ PO TBCR
60.0000 meq | EXTENDED_RELEASE_TABLET | Freq: Once | ORAL | Status: AC
  Administered 2014-09-06: 60 meq via ORAL
  Filled 2014-09-06: qty 3

## 2014-09-06 MED ORDER — WARFARIN SODIUM 3 MG PO TABS
3.0000 mg | ORAL_TABLET | Freq: Once | ORAL | Status: DC
  Filled 2014-09-06: qty 1

## 2014-09-06 MED ORDER — WARFARIN SODIUM 2 MG PO TABS
2.0000 mg | ORAL_TABLET | Freq: Once | ORAL | Status: AC
  Administered 2014-09-06: 2 mg via ORAL
  Filled 2014-09-06: qty 1

## 2014-09-06 MED ORDER — POTASSIUM CHLORIDE ER 10 MEQ PO TBCR: 60.0000 meq | EXTENDED_RELEASE_TABLET | Freq: Two times a day (BID) | ORAL | Status: DC

## 2014-09-06 NOTE — Progress Notes (Signed)
1745 eating well With better appetite

## 2014-09-06 NOTE — Progress Notes (Signed)
Advanced Heart Failure Rounding Note   Subjective:    Lasix gtt at 20 started on 3/17. Feels much better. Remains on milrinone 0.5. Breathing better. Appetite improved.  Objective:   Weight Range:  Vital Signs:   Temp:  [97.4 F (36.3 C)-97.7 F (36.5 C)] 97.4 F (36.3 C) (03/19 0510) Pulse Rate:  [72-77] 72 (03/19 1203) Resp:  [18-20] 18 (03/19 0510) BP: (78-92)/(40-44) 78/40 mmHg (03/19 1203) SpO2:  [96 %-98 %] 97 % (03/19 1203) Weight:  [64.2 kg (141 lb 8.6 oz)] 64.2 kg (141 lb 8.6 oz) (03/19 0510) Last BM Date: 09/04/14  Weight change: Filed Weights   09/04/14 0147 09/05/14 0645 09/06/14 0510  Weight: 65.9 kg (145 lb 4.5 oz) 64.093 kg (141 lb 4.8 oz) 64.2 kg (141 lb 8.6 oz)    Intake/Output:   Intake/Output Summary (Last 24 hours) at 09/06/14 1255 Last data filed at 09/06/14 1213  Gross per 24 hour  Intake 2795.78 ml  Output   3675 ml  Net -879.22 ml     Physical Exam: General: Elderly. Sitting up in chair. NAD. +dyspnea HEENT: normal Neck: supple. JVP 8; Carotids 2+ bilaterally; no bruits. No lymphadenopathy or thryomegaly appreciated.  Cor: PMI laterally displaced. Regular rhythm and rate. + s3  Lungs: clear Abdomen: soft, nontender, mildy distended. No hepatosplenomegaly. No bruits or masses. + high-pitched bowel sounds. Extremities: no cyanosis, clubbing, rash. Tr-1+ edema .  Neuro: alert & orientedx3, cranial nerves grossly intact. Moves all 4 extremities w/o difficulty. Affect pleasant.  Telemetry: NSR with v-pacing  Labs: Basic Metabolic Panel:  Recent Labs Lab 09/01/14 0305 09/02/14 0417 09/03/14 0523 09/04/14 0518 09/05/14 0435 09/06/14 0516  NA 125* 125* 125* 126* 128* 126*  K 4.3 3.5 3.8 3.4* 3.1* 2.6*  CL 88* 86* 89* 87* 85* 80*  CO2 28 33* 28 25 33* 34*  GLUCOSE 118* 232* 129* 120* 118* 112*  BUN 57* 43* 40* 49* 51* 55*  CREATININE 1.88* 1.35 1.42* 1.60* 1.52* 1.58*  CALCIUM 8.6 8.2* 8.7 8.3* 8.8 8.7  MG 2.2  --   --   --   --    --     Liver Function Tests:  Recent Labs Lab 09/01/14 0305  AST 50*  ALT 49  ALKPHOS 288*  BILITOT 0.9  PROT 6.5  ALBUMIN 2.9*    Recent Labs Lab 09/02/14 0417  LIPASE 30  AMYLASE 56   No results for input(s): AMMONIA in the last 168 hours.  CBC:  Recent Labs Lab 08/31/14 2128 09/03/14 0523 09/06/14 0516  WBC 15.7* 13.2* 12.4*  HGB 10.6* 9.7* 9.5*  HCT 31.9* 29.7* 28.8*  MCV 79.4 79.8 78.9  PLT 393 320 323    Cardiac Enzymes:  Recent Labs Lab 08/31/14 2128  TROPONINI 0.03    BNP: BNP (last 3 results)  Recent Labs  08/19/14 2218 08/29/14 0429 08/31/14 2128  BNP 1755.9* 1894.1* 3926.7*    ProBNP (last 3 results)  Recent Labs  03/01/14 1959 03/08/14 2230 06/05/14 0332  PROBNP 11880.0* 15334.0* 19533.0*      Other results:    Imaging: No results found.   Medications:     Scheduled Medications: . amiodarone  200 mg Oral Daily  . Chlorhexidine Gluconate Cloth  6 each Topical Daily  . magnesium oxide  400 mg Oral BID  . metoCLOPramide  20 mg Oral BID  . polyethylene glycol  17 g Oral Daily  . sildenafil  20 mg Oral TID  . sodium chloride  3  mL Intravenous Q12H  . sorbitol  30 mL Oral Daily  . warfarin  2 mg Oral ONCE-1800  . warfarin   Does not apply Once  . Warfarin - Pharmacist Dosing Inpatient   Does not apply q1800    Infusions: . furosemide (LASIX) infusion 20 mg/hr (09/05/14 2349)  . milrinone 0.5 mcg/kg/min (09/06/14 1152)    PRN Medications: sodium chloride, acetaminophen, acetaminophen, morphine injection, ondansetron (ZOFRAN) IV, sodium chloride, traMADol   Assessment:   1. Acute on chronic systolic HF (EF 37%) on home milrinone 2. Acute on CKD, stage III-IV 3. Hyperkalemia 4. PAF in NSR on amio 5. Retroperitoneal mass. 6. hyponatremia 7. PAH 8. Hypocalcemia 9. Acute on chronic respiratory failure 10. DNR   Plan/Discussion:    Remains on milrinone. Feels much better with lasix gtt. Volume  status better. Continue to ask for soup or ice. Has required levophed earlier this admit to maintain output. Will continue lasix gtt one more day. Switch to torsemide tomorrow  If decompensated will need to switch to comfort care.   Continue coumadin for AF.    Long talk earlier this week about disposition. We will continue milrinone for now. If deteriorates off of levophed will need comfort care. If stabilizes will likely need to discuss transfer to [facility] on milrinone or [facility] but he does not want to stop milrinone. I suspect this will continue to be a challenge.   Length of Stay: 5  Glori Bickers MD 09/06/2014, 12:55 PM  Advanced Heart Failure Team Pager [phone] (M-F; 7a - 4p)  Please contact [facility] for night-coverage after hours (4p -7a ) and weekends on [email or web address]

## 2014-09-06 NOTE — Progress Notes (Addendum)
ANTICOAGULATION CONSULT NOTE - Follow Up Consult  Pharmacy Consult for Coumadin Indication: Afib and h/o atrial thrombus  Allergies  Allergen Reactions  . Lisinopril Rash    Patient Measurements: Height: 5\' 8"  (172.7 cm) Weight: 141 lb 8.6 oz (64.2 kg) IBW/kg (Calculated) : 68.4  Vital Signs: Temp: 97.4 F (36.3 C) (03/19 0510) Temp Source: Axillary (03/19 0510) BP: 90/42 mmHg (03/19 0510) Pulse Rate: 77 (03/19 0510)  Labs:  Recent Labs  09/04/14 0518 09/05/14 0435 09/06/14 0516  HGB  --   --  9.5*  HCT  --   --  28.8*  PLT  --   --  323  LABPROT 23.1* 22.2* 25.9*  INR 2.03* 1.93* 2.34*  CREATININE 1.60* 1.52* 1.58*    Estimated Creatinine Clearance: 34.4 mL/min (by C-G formula based on Cr of 1.58).   Assessment: 81yo male presenting to ED evening of 08/30/13 c/o CP and worsening SOB. Discharged earlier that same day for decompensated HF, multiple admissions for the same despite high-dose home milrinone.  INR supratherapeutic at 4.3, warfarin held >> warfarin resumed 3/15 when INR was therapeutic and <3.   INR is therapeutic at 2.34 (increase of 0.41 overnight with 6 mg dose yesterday). Patient's po intake has increased. CBC is low-stable, no bleeding reported.  PTA Coumadin 2mg  TThSat, 4mg  all other days.  Goal of Therapy:  INR 2-3 Monitor platelets by anticoagulation protocol: Yes   Plan:  - Coumadin 2 mg po x1 tonight due to decent rise in INR overnight and in line with home regimen - Monitor daily INR, CBC, s/sx of bleeding  Sloan Leiter, PharmD, BCPS Clinical Pharmacist [phone]  09/06/2014 7:12 AM

## 2014-09-07 LAB — BASIC METABOLIC PANEL
ANION GAP: 9 (ref 5–15)
BUN: 51 mg/dL — ABNORMAL HIGH (ref 6–23)
CHLORIDE: 79 mmol/L — AB (ref 96–112)
CO2: 38 mmol/L — AB (ref 19–32)
Calcium: 8.8 mg/dL (ref 8.4–10.5)
Creatinine, Ser: 1.54 mg/dL — ABNORMAL HIGH (ref 0.50–1.35)
GFR, EST AFRICAN AMERICAN: 48 mL/min — AB (ref >90)
GFR, EST NON AFRICAN AMERICAN: 41 mL/min — AB (ref >90)
GLUCOSE: 124 mg/dL — AB (ref 70–99)
Potassium: 2.8 mmol/L — ABNORMAL LOW (ref 3.5–5.1)
Sodium: 126 mmol/L — ABNORMAL LOW (ref 135–145)

## 2014-09-07 LAB — PROTIME-INR
INR: 2.41 — AB (ref 0.00–1.49)
Prothrombin Time: 26.4 seconds — ABNORMAL HIGH (ref 11.6–15.2)

## 2014-09-07 MED ORDER — SODIUM CHLORIDE 0.9 % IJ SOLN
10.0000 mL | INTRAMUSCULAR | Status: DC | PRN
  Administered 2014-09-07 – 2014-09-08 (×2): 10 mL
  Administered 2014-09-09: 20 mL
  Administered 2014-09-11: 10 mL
  Filled 2014-09-07 (×3): qty 40

## 2014-09-07 MED ORDER — POTASSIUM CHLORIDE CRYS ER 20 MEQ PO TBCR
40.0000 meq | EXTENDED_RELEASE_TABLET | ORAL | Status: AC
  Administered 2014-09-07 (×2): 40 meq via ORAL
  Filled 2014-09-07 (×2): qty 2

## 2014-09-07 MED ORDER — TORSEMIDE 20 MG PO TABS
60.0000 mg | ORAL_TABLET | Freq: Three times a day (TID) | ORAL | Status: DC
  Administered 2014-09-07 – 2014-09-09 (×6): 60 mg via ORAL
  Filled 2014-09-07 (×9): qty 3

## 2014-09-07 MED ORDER — WARFARIN SODIUM 4 MG PO TABS
4.0000 mg | ORAL_TABLET | Freq: Once | ORAL | Status: AC
  Administered 2014-09-07: 4 mg via ORAL
  Filled 2014-09-07: qty 1

## 2014-09-07 NOTE — Progress Notes (Signed)
ANTICOAGULATION CONSULT NOTE - Follow Up Consult  Pharmacy Consult for Coumadin Indication: Afib and h/o atrial thrombus  Allergies  Allergen Reactions  . Lisinopril Rash    Patient Measurements: Height: 5\' 8"  (172.7 cm) Weight: 138 lb 3.2 oz (62.687 kg) IBW/kg (Calculated) : 68.4  Vital Signs: Temp: 98.1 F (36.7 C) (03/20 0428) Temp Source: Oral (03/20 0428) BP: 85/42 mmHg (03/20 0428) Pulse Rate: 63 (03/20 0428)  Labs:  Recent Labs  09/05/14 0435 09/06/14 0516 09/07/14 0442  HGB  --  9.5*  --   HCT  --  28.8*  --   PLT  --  323  --   LABPROT 22.2* 25.9* 26.4*  INR 1.93* 2.34* 2.41*  CREATININE 1.52* 1.58* 1.54*    Estimated Creatinine Clearance: 34.5 mL/min (by C-G formula based on Cr of 1.54).   Assessment: 81yo male presenting to ED evening of 08/30/13 c/o CP and worsening SOB. Discharged earlier that same day for decompensated HF, multiple admissions for the same despite high-dose home milrinone.  INR supratherapeutic at 4.3, warfarin held >> warfarin resumed 3/15 when INR was therapeutic and <3.   INR is therapeutic at 2.41. Patient's po intake has increased. CBC is low-stable, no bleeding reported.  PTA Coumadin 2mg  TThSat, 4mg  all other days.  Goal of Therapy:  INR 2-3 Monitor platelets by anticoagulation protocol: Yes   Plan:  - Coumadin 4 mg po x1 tonight in line with home regimen - Monitor daily INR, CBC, s/sx of bleeding  Sloan Leiter, PharmD, BCPS Clinical Pharmacist [phone]  09/07/2014 8:46 AM

## 2014-09-07 NOTE — Progress Notes (Signed)
Patient ID: Tyler Christensen, male   DOB: 1933/05/19, 81 y.o.   MRN: [id number]     Subjective:    Reports stomach bloating this morning. Breathing is stable.   Objective:   Temp:  [97.8 F (36.6 C)-98.1 F (36.7 C)] 98.1 F (36.7 C) (03/20 0428) Pulse Rate:  [63-73] 72 (03/20 1026) Resp:  [18-148] 18 (03/20 0428) BP: (79-91)/(34-45) 85/34 mmHg (03/20 1026) SpO2:  [93 %-100 %] 97 % (03/20 0428) Weight:  [138 lb 3.2 oz (62.687 kg)] 138 lb 3.2 oz (62.687 kg) (03/20 0428) Last BM Date: 09/04/14  Filed Weights   09/05/14 0645 09/06/14 0510 09/07/14 0428  Weight: 141 lb 4.8 oz (64.093 kg) 141 lb 8.6 oz (64.2 kg) 138 lb 3.2 oz (62.687 kg)    Intake/Output Summary (Last 24 hours) at 09/07/14 1241 Last data filed at 09/07/14 1100  Gross per 24 hour  Intake 1932.1 ml  Output   1925 ml  Net    7.1 ml    Exam:  General: NAD  Resp: crackles bilateral bases  Cardiac: RRR, no m/r/g, no JVD  GI: distended, NT  MSK:: 1-2+ bilateral edema  Neuro: no focal deficits    Lab Results:  Basic Metabolic Panel:  Recent Labs Lab 09/01/14 0305  09/05/14 0435 09/06/14 0516 09/07/14 0442  NA 125*  < > 128* 126* 126*  K 4.3  < > 3.1* 2.6* 2.8*  CL 88*  < > 85* 80* 79*  CO2 28  < > 33* 34* 38*  GLUCOSE 118*  < > 118* 112* 124*  BUN 57*  < > 51* 55* 51*  CREATININE 1.88*  < > 1.52* 1.58* 1.54*  CALCIUM 8.6  < > 8.8 8.7 8.8  MG 2.2  --   --   --   --   < > = values in this interval not displayed.  Liver Function Tests:  Recent Labs Lab 09/01/14 0305  AST 50*  ALT 49  ALKPHOS 288*  BILITOT 0.9  PROT 6.5  ALBUMIN 2.9*    CBC:  Recent Labs Lab 08/31/14 2128 09/03/14 0523 09/06/14 0516  WBC 15.7* 13.2* 12.4*  HGB 10.6* 9.7* 9.5*  HCT 31.9* 29.7* 28.8*  MCV 79.4 79.8 78.9  PLT 393 320 323    Cardiac Enzymes:  Recent Labs Lab 08/31/14 2128  TROPONINI 0.03    BNP:  Recent Labs  03/01/14 1959 03/08/14 2230 06/05/14 0332  PROBNP 11880.0* 15334.0*  19533.0*    Coagulation:  Recent Labs Lab 09/05/14 0435 09/06/14 0516 09/07/14 0442  INR 1.93* 2.34* 2.41*    ECG:   Medications:   Scheduled Medications: . amiodarone  200 mg Oral Daily  . Chlorhexidine Gluconate Cloth  6 each Topical Daily  . magnesium oxide  400 mg Oral BID  . metoCLOPramide  20 mg Oral BID  . polyethylene glycol  17 g Oral Daily  . sildenafil  20 mg Oral TID  . sodium chloride  3 mL Intravenous Q12H  . sorbitol  30 mL Oral Daily  . warfarin  4 mg Oral ONCE-1800  . warfarin   Does not apply Once  . Warfarin - Pharmacist Dosing Inpatient   Does not apply q1800     Infusions: . furosemide (LASIX) infusion 20 mg/hr (09/07/14 0506)  . milrinone 0.5 mcg/kg/min (09/07/14 0336)     PRN Medications:  sodium chloride, acetaminophen, acetaminophen, diphenhydrAMINE, morphine injection, ondansetron (ZOFRAN) IV, sodium chloride, sodium chloride, traMADol     Assessment/Plan  1. Acute on chronic systolic HF - Jan 1914 echo: LVEF 15%, cannot eval diastolic function  - seen by CHF yesterday. Has been on milrinone drip at 0.5 along with lasix drip. From CHF notes plan to change lasix gtt to oral toresmide today. Previously had troubles maintaining blood pressures and required levophed, now off. From notes considerations for comfort care if continues to decline - soft bp's yesterday, systolics in mid 78G today. He is mentating and making urine.  - negative 1.1 liters yesterday, negative 5.4 liters since admission. Cr and BUN trending down - he has been on lasix drip 20mg /hr (total daily 480 mg) . Will change to torsemide 60mg  PO tid to start.  - further discussions about comfort care with CHF team tomorrow. Overall prognosis poor, endstage heart failure not responding to high dose inotropes with persistent hypotension, hyponatremia, renal dysfunction, and volume overload.   2. Afib - on amio and coumadin, continue  3. Hypokalemia - will replace today,  optimize electrolytes while on milrinone  4. DNR    Carlyle Dolly, M.D.

## 2014-09-08 ENCOUNTER — Encounter: Payer: Self-pay | Admitting: Internal Medicine

## 2014-09-08 LAB — BASIC METABOLIC PANEL
ANION GAP: 12 (ref 5–15)
Anion gap: 15 (ref 5–15)
BUN: 52 mg/dL — ABNORMAL HIGH (ref 6–23)
BUN: 58 mg/dL — AB (ref 6–23)
CALCIUM: 8.7 mg/dL (ref 8.4–10.5)
CALCIUM: 8.4 mg/dL (ref 8.4–10.5)
CO2: 32 mmol/L (ref 19–32)
CO2: 32 mmol/L (ref 19–32)
CREATININE: 1.54 mg/dL — AB (ref 0.50–1.35)
CREATININE: 1.83 mg/dL — AB (ref 0.50–1.35)
Chloride: 80 mmol/L — ABNORMAL LOW (ref 96–112)
Chloride: 83 mmol/L — ABNORMAL LOW (ref 96–112)
GFR calc Af Amer: 48 mL/min — ABNORMAL LOW (ref >90)
GFR calc Af Amer: 39 mL/min — ABNORMAL LOW (ref >90)
GFR calc non Af Amer: 41 mL/min — ABNORMAL LOW (ref >90)
GFR calc non Af Amer: 33 mL/min — ABNORMAL LOW (ref >90)
GLUCOSE: 121 mg/dL — AB (ref 70–99)
GLUCOSE: 123 mg/dL — AB (ref 70–99)
Potassium: 2.3 mmol/L — CL (ref 3.5–5.1)
Potassium: 3 mmol/L — ABNORMAL LOW (ref 3.5–5.1)
SODIUM: 127 mmol/L — AB (ref 135–145)
Sodium: 127 mmol/L — ABNORMAL LOW (ref 135–145)

## 2014-09-08 LAB — PROTIME-INR
INR: 2.16 — ABNORMAL HIGH (ref 0.00–1.49)
PROTHROMBIN TIME: 24.3 s — AB (ref 11.6–15.2)

## 2014-09-08 MED ORDER — POTASSIUM CHLORIDE CRYS ER 20 MEQ PO TBCR
20.0000 meq | EXTENDED_RELEASE_TABLET | Freq: Once | ORAL | Status: AC
  Administered 2014-09-08: 20 meq via ORAL
  Filled 2014-09-08: qty 1

## 2014-09-08 MED ORDER — POTASSIUM CHLORIDE CRYS ER 20 MEQ PO TBCR
40.0000 meq | EXTENDED_RELEASE_TABLET | ORAL | Status: AC
  Administered 2014-09-08 (×2): 40 meq via ORAL
  Filled 2014-09-08 (×2): qty 2

## 2014-09-08 MED ORDER — POTASSIUM CHLORIDE CRYS ER 20 MEQ PO TBCR
40.0000 meq | EXTENDED_RELEASE_TABLET | Freq: Once | ORAL | Status: AC
  Administered 2014-09-08: 40 meq via ORAL
  Filled 2014-09-08: qty 2

## 2014-09-08 MED ORDER — POTASSIUM CHLORIDE CRYS ER 20 MEQ PO TBCR
40.0000 meq | EXTENDED_RELEASE_TABLET | Freq: Once | ORAL | Status: AC
Start: 2014-09-08 — End: 2014-09-09
  Administered 2014-09-09: 40 meq via ORAL
  Filled 2014-09-08: qty 2

## 2014-09-08 MED ORDER — WARFARIN SODIUM 4 MG PO TABS
4.0000 mg | ORAL_TABLET | Freq: Once | ORAL | Status: AC
  Administered 2014-09-08: 4 mg via ORAL
  Filled 2014-09-08: qty 1

## 2014-09-08 MED ORDER — ALPRAZOLAM 0.25 MG PO TABS: 0.2500 mg | ORAL_TABLET | Freq: Three times a day (TID) | ORAL | Status: DC | PRN

## 2014-09-08 NOTE — Progress Notes (Signed)
CRITICAL VALUE ALERT  Critical value received: Potassium of 2.3  Date of notification:  09/08/2014  Time of notification:  4270  Critical value read back:Yes.    Nurse who received alert:  Shari Prows., RN  MD notified (1st page):  [phone]   Time of first page:  0555  MD notified (2nd page):  Time of second page:  Responding MD:  Dr. Wynonia Lawman  Time MD responded:  [phone]

## 2014-09-08 NOTE — Progress Notes (Addendum)
Advanced Heart Failure Rounding Note   Subjective:    Yesterday lasix drip stopped and he was started on torsemide. Weight down a pound overnight. (8 pounds total). Breathing is good.   Objective:   Weight Range:  Vital Signs:   Temp:  [97.2 F (36.2 C)-98.5 F (36.9 C)] 98.2 F (36.8 C) (03/21 2109) Pulse Rate:  [69-78] 76 (03/21 2109) Resp:  [18-20] 18 (03/21 2109) BP: (89-106)/(39-53) 95/51 mmHg (03/21 2109) SpO2:  [92 %-99 %] 97 % (03/21 2109) Weight:  [62.324 kg (137 lb 6.4 oz)] 62.324 kg (137 lb 6.4 oz) (03/21 0615) Last BM Date: 09/04/14  Weight change: Filed Weights   09/06/14 0510 09/07/14 0428 09/08/14 0615  Weight: 64.2 kg (141 lb 8.6 oz) 62.687 kg (138 lb 3.2 oz) 62.324 kg (137 lb 6.4 oz)    Intake/Output:   Intake/Output Summary (Last 24 hours) at 09/08/14 2110 Last data filed at 09/08/14 2103  Gross per 24 hour  Intake 2049.6 ml  Output   1800 ml  Net  249.6 ml     Physical Exam: General: Elderly. Sitting up in chair. NAD. +dyspnea HEENT: normal Neck: supple. JVP 7; Carotids 2+ bilaterally; no bruits. No lymphadenopathy or thryomegaly appreciated.  Cor: PMI laterally displaced. Regular rhythm and rate. + s3  Lungs: clear Abdomen: soft, nontender, mildy distended. No hepatosplenomegaly. No bruits or masses. + high-pitched bowel sounds. Extremities: no cyanosis, clubbing, rash. 1+ edema at ankles.  Neuro: alert & orientedx3, cranial nerves grossly intact. Moves all 4 extremities w/o difficulty. Affect pleasant.  Telemetry: NSR with v-pacing  Labs: Basic Metabolic Panel:  Recent Labs Lab 09/04/14 0518 09/05/14 0435 09/06/14 0516 09/07/14 0442 09/08/14 0442  NA 126* 128* 126* 126* 127*  K 3.4* 3.1* 2.6* 2.8* 2.3*  CL 87* 85* 80* 79* 80*  CO2 25 33* 34* 38* 32  GLUCOSE 120* 118* 112* 124* 121*  BUN 49* 51* 55* 51* 52*  CREATININE 1.60* 1.52* 1.58* 1.54* 1.54*  CALCIUM 8.3* 8.8 8.7 8.8 8.7    Liver Function Tests: No results for  input(s): AST, ALT, ALKPHOS, BILITOT, PROT, ALBUMIN in the last 168 hours.  Recent Labs Lab 09/02/14 0417  LIPASE 30  AMYLASE 56   No results for input(s): AMMONIA in the last 168 hours.  CBC:  Recent Labs Lab 09/03/14 0523 09/06/14 0516  WBC 13.2* 12.4*  HGB 9.7* 9.5*  HCT 29.7* 28.8*  MCV 79.8 78.9  PLT 320 323    Cardiac Enzymes: No results for input(s): CKTOTAL, CKMB, CKMBINDEX, TROPONINI in the last 168 hours.  BNP: BNP (last 3 results)  Recent Labs  08/19/14 2218 08/29/14 0429 08/31/14 2128  BNP 1755.9* 1894.1* 3926.7*    ProBNP (last 3 results)  Recent Labs  03/01/14 1959 03/08/14 2230 06/05/14 0332  PROBNP 11880.0* 15334.0* 19533.0*      Other results:    Imaging: No results found.   Medications:     Scheduled Medications: . amiodarone  200 mg Oral Daily  . magnesium oxide  400 mg Oral BID  . metoCLOPramide  20 mg Oral BID  . polyethylene glycol  17 g Oral Daily  . sildenafil  20 mg Oral TID  . sodium chloride  3 mL Intravenous Q12H  . sorbitol  30 mL Oral Daily  . torsemide  60 mg Oral TID  . warfarin   Does not apply Once  . Warfarin - Pharmacist Dosing Inpatient   Does not apply q1800    Infusions: . milrinone 0.5  mcg/kg/min (09/08/14 1837)    PRN Medications: sodium chloride, acetaminophen, acetaminophen, diphenhydrAMINE, morphine injection, ondansetron (ZOFRAN) IV, sodium chloride, sodium chloride, traMADol   Assessment:   1. Acute on chronic systolic HF (EF 68%) on home milrinone 2. Acute on CKD, stage III-IV 3. Hyperkalemia 4. PAF in NSR on amio 5. Retroperitoneal mass. 6. hyponatremia 7. PAH 8. Hypocalcemia 9. Acute on chronic respiratory failure 10. DNR   Plan/Discussion:    Remains on milrinone. Feels much better with lasix gtt. Volume status better. Renal function improved. Back on oral diuretics. Continue coumadin for AF.   His family was in room with him tonight and we had long talk that I don't  think he can be taken care of at home. Have suggested SNF. [facility] is only facility that will take milrinone. Will begin discussions with their facility tomorrow about possibility of transfer there. Supp K+ aggressively.   Length of Stay: 7  Glori Bickers MD 09/08/2014, 9:10 PM  Advanced Heart Failure Team Pager [phone] (M-F; [place])  Please contact [facility] for night-coverage after hours (4p -7a ) and weekends on [email or web address]

## 2014-09-08 NOTE — Progress Notes (Signed)
ANTICOAGULATION CONSULT NOTE - Follow Up Consult  Pharmacy Consult for Coumadin Indication: Afib and h/o atrial thrombus  Allergies  Allergen Reactions  . Lisinopril Rash    Patient Measurements: Height: 5\' 8"  (172.7 cm) Weight: 137 lb 6.4 oz (62.324 kg) (c scale with shoes and sweater and pants and shirt on) IBW/kg (Calculated) : 68.4  Vital Signs: Temp: 98.4 F (36.9 C) (03/21 0615) Temp Source: Oral (03/21 0615) BP: 89/49 mmHg (03/21 0615) Pulse Rate: 73 (03/21 0615)  Labs:  Recent Labs  09/06/14 0516 09/07/14 0442 09/08/14 0442  HGB 9.5*  --   --   HCT 28.8*  --   --   PLT 323  --   --   LABPROT 25.9* 26.4* 24.3*  INR 2.34* 2.41* 2.16*  CREATININE 1.58* 1.54* 1.54*    Estimated Creatinine Clearance: 34.3 mL/min (by C-G formula based on Cr of 1.54).   Assessment: 81yo male presenting to ED evening of 08/30/13 c/o CP and worsening SOB. Discharged earlier that same day for decompensated HF, multiple admissions for the same despite high-dose home milrinone.  INR supratherapeutic at 4.3, warfarin held >> warfarin resumed 3/15 when INR was therapeutic and <3.   INR remains therapeutic at 2.16. CBC is low-stable, no bleeding reported.  PTA Coumadin 2mg  TThSat, 4mg  all other days.  Goal of Therapy:  INR 2-3 Monitor platelets by anticoagulation protocol: Yes   Plan:  - Coumadin 4mg  po x1 tonight (in line with home regimen) - Monitor daily INR, CBC Q72h, s/sx of bleeding  Drucie Opitz, PharmD Clinical Pharmacy Resident Pager: [phone] 09/08/2014 7:08 AM

## 2014-09-09 ENCOUNTER — Encounter (HOSPITAL_COMMUNITY)
Admission: EM | Disposition: A | Discharge status: Home / Self Care | Payer: Medicare Other | Source: Home / Self Care | Attending: Internal Medicine

## 2014-09-09 LAB — CBC
HCT: 30.1 % — ABNORMAL LOW (ref 39.0–52.0)
Hemoglobin: 9.7 g/dL — ABNORMAL LOW (ref 13.0–17.0)
MCH: 25.9 pg — AB (ref 26.0–34.0)
MCHC: 32.2 g/dL (ref 30.0–36.0)
MCV: 80.3 fL (ref 78.0–100.0)
PLATELETS: 331 10*3/uL (ref 150–400)
RBC: 3.75 MIL/uL — AB (ref 4.22–5.81)
RDW: 18.7 % — ABNORMAL HIGH (ref 11.5–15.5)
WBC: 12.5 10*3/uL — ABNORMAL HIGH (ref 4.0–10.5)

## 2014-09-09 LAB — BASIC METABOLIC PANEL
ANION GAP: 12 (ref 5–15)
BUN: 60 mg/dL — ABNORMAL HIGH (ref 6–23)
CHLORIDE: 85 mmol/L — AB (ref 96–112)
CO2: 32 mmol/L (ref 19–32)
CREATININE: 1.76 mg/dL — AB (ref 0.50–1.35)
Calcium: 8.5 mg/dL (ref 8.4–10.5)
GFR calc non Af Amer: 35 mL/min — ABNORMAL LOW (ref >90)
GFR, EST AFRICAN AMERICAN: 41 mL/min — AB (ref >90)
Glucose, Bld: 132 mg/dL — ABNORMAL HIGH (ref 70–99)
POTASSIUM: 2.7 mmol/L — AB (ref 3.5–5.1)
Sodium: 129 mmol/L — ABNORMAL LOW (ref 135–145)

## 2014-09-09 LAB — POTASSIUM: Potassium: 2.6 mmol/L — CL (ref 3.5–5.1)

## 2014-09-09 LAB — MAGNESIUM: Magnesium: 2 mg/dL (ref 1.5–2.5)

## 2014-09-09 LAB — PROTIME-INR
INR: 2.29 — AB (ref 0.00–1.49)
PROTHROMBIN TIME: 25.4 s — AB (ref 11.6–15.2)

## 2014-09-09 SURGERY — LEFT HEART CATHETERIZATION WITH CORONARY ANGIOGRAM

## 2014-09-09 MED ORDER — METOLAZONE 2.5 MG PO TABS
2.5000 mg | ORAL_TABLET | Freq: Once | ORAL | Status: AC
  Administered 2014-09-09: 2.5 mg via ORAL
  Filled 2014-09-09: qty 1

## 2014-09-09 MED ORDER — SPIRONOLACTONE 50 MG PO TABS
50.0000 mg | ORAL_TABLET | Freq: Every day | ORAL | Status: DC
  Administered 2014-09-09 – 2014-09-10 (×2): 50 mg via ORAL
  Filled 2014-09-09 (×3): qty 1

## 2014-09-09 MED ORDER — METOLAZONE 2.5 MG PO TABS
2.5000 mg | ORAL_TABLET | ORAL | Status: DC
  Administered 2014-09-10 – 2014-09-12 (×3): 2.5 mg via ORAL
  Filled 2014-09-09 (×5): qty 1

## 2014-09-09 MED ORDER — POTASSIUM CHLORIDE CRYS ER 20 MEQ PO TBCR
40.0000 meq | EXTENDED_RELEASE_TABLET | Freq: Two times a day (BID) | ORAL | Status: DC
  Administered 2014-09-09 – 2014-09-10 (×3): 40 meq via ORAL
  Filled 2014-09-09 (×4): qty 2

## 2014-09-09 MED ORDER — WARFARIN SODIUM 4 MG PO TABS
4.0000 mg | ORAL_TABLET | Freq: Once | ORAL | Status: AC
  Administered 2014-09-09: 4 mg via ORAL
  Filled 2014-09-09: qty 1

## 2014-09-09 MED ORDER — TORSEMIDE 20 MG PO TABS
60.0000 mg | ORAL_TABLET | Freq: Two times a day (BID) | ORAL | Status: DC
  Administered 2014-09-09 – 2014-09-10 (×2): 60 mg via ORAL
  Filled 2014-09-09 (×4): qty 3

## 2014-09-09 NOTE — Progress Notes (Addendum)
Advanced Heart Failure Rounding Note   Subjective:    Back on torsemide. Weight up a pound overnight. (8 pounds total). Breathing is good. Renal function is stable/   Objective:   Weight Range:  Vital Signs:   Temp:  [98.2 F (36.8 C)-98.5 F (36.9 C)] 98.2 F (36.8 C) (03/22 0541) Pulse Rate:  [76-80] 80 (03/22 0133) Resp:  [18] 18 (03/22 0541) BP: (94-100)/(39-53) 95/41 mmHg (03/22 0541) SpO2:  [93 %-99 %] 93 % (03/22 0541) Weight:  [62.732 kg (138 lb 4.8 oz)] 62.732 kg (138 lb 4.8 oz) (03/22 0541) Last BM Date: 09/07/14  Weight change: Filed Weights   09/07/14 0428 09/08/14 0615 09/09/14 0541  Weight: 62.687 kg (138 lb 3.2 oz) 62.324 kg (137 lb 6.4 oz) 62.732 kg (138 lb 4.8 oz)    Intake/Output:   Intake/Output Summary (Last 24 hours) at 09/09/14 1138 Last data filed at 09/09/14 0924  Gross per 24 hour  Intake 1399.6 ml  Output   1075 ml  Net  324.6 ml     Physical Exam: General: Elderly. Lying in bed. NAD. +dyspnea HEENT: normal Neck: supple. JVP 7; Carotids 2+ bilaterally; no bruits. No lymphadenopathy or thryomegaly appreciated.  Cor: PMI laterally displaced. Regular rhythm and rate. + s3  Lungs: clear Abdomen: soft, nontender, mildy distended. No hepatosplenomegaly. No bruits or masses. + high-pitched bowel sounds. Extremities: no cyanosis, clubbing, rash. Tr- 1+ edema at ankles.  Neuro: alert & orientedx3, cranial nerves grossly intact. Moves all 4 extremities w/o difficulty. Affect pleasant.  Telemetry: NSR with v-pacing  Labs: Basic Metabolic Panel:  Recent Labs Lab 09/06/14 0516 09/07/14 0442 09/08/14 0442 09/08/14 2031 09/09/14 0455  NA 126* 126* 127* 127* 129*  K 2.6* 2.8* 2.3* 3.0* 2.7*  CL 80* 79* 80* 83* 85*  CO2 34* 38* 32 32 32  GLUCOSE 112* 124* 121* 123* 132*  BUN 55* 51* 52* 58* 60*  CREATININE 1.58* 1.54* 1.54* 1.83* 1.76*  CALCIUM 8.7 8.8 8.7 8.4 8.5    Liver Function Tests: No results for input(s): AST, ALT, ALKPHOS,  BILITOT, PROT, ALBUMIN in the last 168 hours. No results for input(s): LIPASE, AMYLASE in the last 168 hours. No results for input(s): AMMONIA in the last 168 hours.  CBC:  Recent Labs Lab 09/03/14 0523 09/06/14 0516 09/09/14 0455  WBC 13.2* 12.4* 12.5*  HGB 9.7* 9.5* 9.7*  HCT 29.7* 28.8* 30.1*  MCV 79.8 78.9 80.3  PLT 320 323 331    Cardiac Enzymes: No results for input(s): CKTOTAL, CKMB, CKMBINDEX, TROPONINI in the last 168 hours.  BNP: BNP (last 3 results)  Recent Labs  08/19/14 2218 08/29/14 0429 08/31/14 2128  BNP 1755.9* 1894.1* 3926.7*    ProBNP (last 3 results)  Recent Labs  03/01/14 1959 03/08/14 2230 06/05/14 0332  PROBNP 11880.0* 15334.0* 19533.0*      Other results:    Imaging: No results found.   Medications:     Scheduled Medications: . amiodarone  200 mg Oral Daily  . magnesium oxide  400 mg Oral BID  . metoCLOPramide  20 mg Oral BID  . polyethylene glycol  17 g Oral Daily  . potassium chloride  40 mEq Oral BID  . sildenafil  20 mg Oral TID  . sodium chloride  3 mL Intravenous Q12H  . sorbitol  30 mL Oral Daily  . torsemide  60 mg Oral TID  . warfarin  4 mg Oral ONCE-1800  . warfarin   Does not apply Once  .  Warfarin - Pharmacist Dosing Inpatient   Does not apply q1800    Infusions: . milrinone 0.5 mcg/kg/min (09/08/14 1837)    PRN Medications: sodium chloride, acetaminophen, acetaminophen, diphenhydrAMINE, morphine injection, ondansetron (ZOFRAN) IV, sodium chloride, sodium chloride, traMADol   Assessment:   1. Acute on chronic systolic HF (EF 30%) on home milrinone 2. Acute on CKD, stage III-IV 3. Hyperkalemia 4. PAF in NSR on amio 5. Retroperitoneal mass. 6. hyponatremia 7. PAH 8. Hypocalcemia 9. Acute on chronic respiratory failure 10. DNR   Plan/Discussion:    Remains on milrinone. Now back on po torsemide. But on higher dose than at home. Will need to keep dry. Volume status ok. Renal function stable.  Will change torsemide to 80 bid and add daily metolazone.  Add back spiro to help preserve K. Continue coumadin for AF.   His family was in room with him last night and we had long talk that I don't think he can be taken care of at home. Have suggested SNF. [facility] only facility that will take milrinone. Will begin discussions with their facility today about possibility of transfer there on Thursday. .   Length of Stay: 8  Glori Bickers MD 09/09/2014, 11:38 AM  Advanced Heart Failure Team Pager [phone] (M-F; [place])  Please contact [facility] for night-coverage after hours (4p -7a ) and weekends on [email or web address]

## 2014-09-09 NOTE — Progress Notes (Signed)
LCSW made aware from CM patient in need of higher level of care at DC, specifically SNF.  LCSW working up patient and covering for unit CSW. Will have full assessment to come. Due to patient being on specific medication, only SNF option is [facility].  Discussed case with Tammy, will get patient permission to fax out information for placement if in agreement.  Passar completed and received. FL2 completed.  Will fax patient out this afternoon. Not medically stable at this time per CM.  Possibly by the end of the week.     [name], MSW Clinical Social Work: Emergency Room [phone]

## 2014-09-09 NOTE — Progress Notes (Addendum)
Clinical Social Work Department CLINICAL SOCIAL WORK PLACEMENT NOTE 09/09/2014  Patient:  Tyler Christensen, Tyler Christensen  Account Number:  [id number] [place] date:  08/31/2014  Clinical Social Worker:  Caleen Essex, LCSW  Date/time:  09/09/2014 12:00 M  Clinical Social Work is seeking post-discharge placement for this patient at the following level of care:   [place]   (*CSW will update this form in Epic as items are completed)   09/09/2014  Patient/family provided with [place] Department of Clinical Social Work's list of facilities offering this level of care within the geographic area requested by the patient (or if unable, by the patient's family).  09/09/2014  Patient/family informed of their freedom to choose among providers that offer the needed level of care, that participate in Medicare, Medicaid or managed care program needed by the patient, have an available bed and are willing to accept the patient.  09/09/2014  Patient/family informed of [facility]' ownership interest in [facility], as well as of the fact that they are under no obligation to receive care at this facility.  PASARR submitted to EDS on 09/09/2014 PASARR number received on 09/09/2014  FL2 transmitted to all facilities in geographic area requested by pt/family on  09/09/2014 FL2 transmitted to all facilities within larger geographic area on   Patient informed that his/her managed care company has contracts with or will negotiate with  certain facilities, including the following:     Patient/family informed of bed offers received:  09/10/2014  Patient chooses bed at [place] Physician recommends and patient chooses bed at    Patient to be transferred to  [place]  09/12/2014 Patient to be transferred to facility by [facility] Patient and family notified of transfer on  Name of family member notified:  09/12/2014 Costella Hatcher  The following physician request were entered in  Epic:   Additional Comments:   [name], MSW Clinical Social Work: Emergency Room [phone]

## 2014-09-09 NOTE — Progress Notes (Signed)
ANTICOAGULATION CONSULT NOTE - Follow Up Consult  Pharmacy Consult for Coumadin Indication: Afib and h/o atrial thrombus  Allergies  Allergen Reactions  . Lisinopril Rash    Patient Measurements: Height: 5\' 8"  (172.7 cm) Weight: 138 lb 4.8 oz (62.732 kg) (Scale B) IBW/kg (Calculated) : 68.4  Vital Signs: Temp: 98.2 F (36.8 C) (03/22 0541) Temp Source: Oral (03/22 0541) BP: 95/41 mmHg (03/22 0541) Pulse Rate: 80 (03/22 0133)  Labs:  Recent Labs  09/07/14 0442 09/08/14 0442 09/08/14 2031 09/09/14 0455  HGB  --   --   --  9.7*  HCT  --   --   --  30.1*  PLT  --   --   --  331  LABPROT 26.4* 24.3*  --  25.4*  INR 2.41* 2.16*  --  2.29*  CREATININE 1.54* 1.54* 1.83* 1.76*    Estimated Creatinine Clearance: 30.2 mL/min (by C-G formula based on Cr of 1.76).   Assessment: 81yo male presenting to ED evening of 08/30/13 c/o CP and worsening SOB. Discharged earlier that same day for decompensated HF, multiple admissions for the same despite high-dose home milrinone.  INR supratherapeutic at 4.3, warfarin held >> warfarin resumed 3/15 when INR was therapeutic and <3.   INR remains therapeutic at 2.29. CBC is low-stable, no bleeding reported.  PTA Coumadin 2mg  TThSat, 4mg  all other days.  Goal of Therapy:  INR 2-3 Monitor platelets by anticoagulation protocol: Yes   Plan:  - Coumadin 4mg  po x1 tonight - Monitor daily INR, CBC Q72h, s/sx of bleeding  Drucie Opitz, PharmD Clinical Pharmacy Resident Pager: [phone] 09/09/2014 7:38 AM

## 2014-09-09 NOTE — Progress Notes (Signed)
Pt. Requesting to sit in recliner chair in the doorway of his room. Pt. Refusing to stay inside room.

## 2014-09-09 NOTE — Progress Notes (Signed)
CRITICAL VALUE ALERT  Critical value received:  K+ 2.7  Date of notification:  09/09/2014  Time of notification:  0715  Critical value read back:Yes.    Nurse who received alert:  Santiago Glad, RN  MD notified (1st page):  [phone]  Time of first page:  0750  MD notified (2nd page):  Time of second page:  Responding MD:    Time MD responded:

## 2014-09-09 NOTE — Progress Notes (Signed)
Clinical Social Work Department BRIEF PSYCHOSOCIAL ASSESSMENT 09/09/2014  Patient:  Tyler Christensen, Tyler Christensen     Account Number:  [id number]     [place] date:  08/31/2014  Clinical Social Worker:  Forest Gleason  Date/Time:  09/09/2014 03:29 PM  Referred by:  Physician  Date Referred:  09/09/2014 Referred for  SNF Placement   Other Referral:   Interview type:  Patient Other interview type:   Used of RN and interpreter due to patient only speaking Spanish    PSYCHOSOCIAL DATA Living Status:  ALONE Admitted from facility:   Level of care:  Independent Living Primary support name:  Tyler Christensen (son) Primary support relationship to patient:  FAMILY Degree of support available:   High level of support, but due to medications needs, patient needing higher level of care at facility    CURRENT CONCERNS Current Concerns  Post-Acute Placement   Other Concerns:    SOCIAL WORK ASSESSMENT / PLAN LCSW made aware through consult that due to specific medicaiton regarding IV, he would need SNF. LCSW utlized interpreter in efforts to communicate with patient about disposition. patient agreeable to SNF, aware of limited options due to type of medicaiton he nis currently on. Patient gives list of family members. LCSW attempted to call family with interpreter line, however unable to reach. Son Tyler Christensen speaks very little English per patient.    Plan:  LCSW faxed out to SNF search in [facility], specifically [facility] [place] [facility] [employer] [facility]. Tammy called and made aware of referral and will follow up on referral with building.  Will continue to work to get in touch with family.  Once medically stable patient to DC to SNF. Most likely will need [facility].   Assessment/plan status:  Psychosocial Support/Ongoing Assessment of Needs Other assessment/ plan:   Information/referral to community resources:    PATIENT'S/FAMILY'S RESPONSE TO PLAN OF CARE: Patient very limited in ability to communicate with LCSW, but agreeable with  interperter present.  Plan is SNF at DC to continue medications.  Patient observed being very tired and lethargic.  Cooperative and compliant with plan.   [name], MSW Clinical Social Work: Emergency Room [phone]

## 2014-09-10 LAB — PROTIME-INR
INR: 2.69 — AB (ref 0.00–1.49)
PROTHROMBIN TIME: 28.8 s — AB (ref 11.6–15.2)

## 2014-09-10 LAB — BASIC METABOLIC PANEL
Anion gap: 9 (ref 5–15)
BUN: 56 mg/dL — ABNORMAL HIGH (ref 6–23)
CALCIUM: 8.4 mg/dL (ref 8.4–10.5)
CO2: 33 mmol/L — ABNORMAL HIGH (ref 19–32)
CREATININE: 1.52 mg/dL — AB (ref 0.50–1.35)
Chloride: 86 mmol/L — ABNORMAL LOW (ref 96–112)
GFR calc Af Amer: 48 mL/min — ABNORMAL LOW (ref >90)
GFR calc non Af Amer: 42 mL/min — ABNORMAL LOW (ref >90)
GLUCOSE: 108 mg/dL — AB (ref 70–99)
Potassium: 2.6 mmol/L — CL (ref 3.5–5.1)
SODIUM: 128 mmol/L — AB (ref 135–145)

## 2014-09-10 MED ORDER — FUROSEMIDE 10 MG/ML IJ SOLN
80.0000 mg | Freq: Once | INTRAMUSCULAR | Status: AC
  Administered 2014-09-10: 80 mg via INTRAVENOUS

## 2014-09-10 MED ORDER — WARFARIN SODIUM 2 MG PO TABS
2.0000 mg | ORAL_TABLET | Freq: Once | ORAL | Status: AC
  Administered 2014-09-10: 2 mg via ORAL
  Filled 2014-09-10: qty 1

## 2014-09-10 MED ORDER — TORSEMIDE 20 MG PO TABS
80.0000 mg | ORAL_TABLET | Freq: Two times a day (BID) | ORAL | Status: DC
  Administered 2014-09-10 – 2014-09-12 (×4): 80 mg via ORAL
  Filled 2014-09-10 (×7): qty 4

## 2014-09-10 MED ORDER — POTASSIUM CHLORIDE CRYS ER 20 MEQ PO TBCR
60.0000 meq | EXTENDED_RELEASE_TABLET | Freq: Once | ORAL | Status: AC
  Administered 2014-09-10: 60 meq via ORAL

## 2014-09-10 MED ORDER — POTASSIUM CHLORIDE CRYS ER 20 MEQ PO TBCR
60.0000 meq | EXTENDED_RELEASE_TABLET | Freq: Four times a day (QID) | ORAL | Status: DC
  Administered 2014-09-10 – 2014-09-11 (×6): 60 meq via ORAL
  Administered 2014-09-12: 40 meq via ORAL
  Administered 2014-09-12: 60 meq via ORAL
  Filled 2014-09-10 (×11): qty 3

## 2014-09-10 MED ORDER — FUROSEMIDE 10 MG/ML IJ SOLN
INTRAMUSCULAR | Status: AC
  Filled 2014-09-10: qty 8

## 2014-09-10 NOTE — Progress Notes (Signed)
CSW (Clinical Education officer, museum) unable to reach family to notify of bed offer from [facility] [place]. Left voicemail/s asking for return phone call.  [place], [place]

## 2014-09-10 NOTE — Progress Notes (Addendum)
Advanced Heart Failure Rounding Note   Subjective:    Back on torsemide. Weight down 2 pounds overnight. (9 pounds total). But says breathing is worse and asking for IV lasix.    Objective:   Weight Range:  Vital Signs:   Temp:  [97.2 F (36.2 C)-98.3 F (36.8 C)] 97.7 F (36.5 C) (03/23 1432) Pulse Rate:  [68-79] 79 (03/23 1720) Resp:  [17-18] 18 (03/23 1432) BP: (89-119)/(47-58) 119/58 mmHg (03/23 1720) SpO2:  [94 %-100 %] 100 % (03/23 1720) Weight:  [61.78 kg (136 lb 3.2 oz)] 61.78 kg (136 lb 3.2 oz) (03/23 0637) Last BM Date: 09/07/14  Weight change: Filed Weights   09/08/14 0615 09/09/14 0541 09/10/14 0637  Weight: 62.324 kg (137 lb 6.4 oz) 62.732 kg (138 lb 4.8 oz) 61.78 kg (136 lb 3.2 oz)    Intake/Output:   Intake/Output Summary (Last 24 hours) at 09/10/14 1739 Last data filed at 09/10/14 1432  Gross per 24 hour  Intake   1503 ml  Output   1300 ml  Net    203 ml     Physical Exam: General: Elderly. Lying in bed. NAD. +dyspnea HEENT: normal Neck: supple. JVP 9 Carotids 2+ bilaterally; no bruits. No lymphadenopathy or thryomegaly appreciated.  Cor: PMI laterally displaced. Regular rhythm and rate. + s3  Lungs: basilar crackles  Abdomen: soft, nontender, mildy distended. No hepatosplenomegaly. No bruits or masses. + high-pitched bowel sounds. Extremities: no cyanosis, clubbing, rash. Tr1+ edema at ankles.  Neuro: alert & orientedx3, cranial nerves grossly intact. Moves all 4 extremities w/o difficulty. Affect pleasant.  Telemetry: NSR with v-pacing  Labs: Basic Metabolic Panel:  Recent Labs Lab 09/07/14 0442 09/08/14 0442 09/08/14 2031 09/09/14 0455 09/09/14 1506 09/10/14 0520  NA 126* 127* 127* 129*  --  128*  K 2.8* 2.3* 3.0* 2.7* 2.6* 2.6*  CL 79* 80* 83* 85*  --  86*  CO2 38* 32 32 32  --  33*  GLUCOSE 124* 121* 123* 132*  --  108*  BUN 51* 52* 58* 60*  --  56*  CREATININE 1.54* 1.54* 1.83* 1.76*  --  1.52*  CALCIUM 8.8 8.7 8.4 8.5  --   8.4  MG  --   --   --   --  2.0  --     Liver Function Tests: No results for input(s): AST, ALT, ALKPHOS, BILITOT, PROT, ALBUMIN in the last 168 hours. No results for input(s): LIPASE, AMYLASE in the last 168 hours. No results for input(s): AMMONIA in the last 168 hours.  CBC:  Recent Labs Lab 09/06/14 0516 09/09/14 0455  WBC 12.4* 12.5*  HGB 9.5* 9.7*  HCT 28.8* 30.1*  MCV 78.9 80.3  PLT 323 331    Cardiac Enzymes: No results for input(s): CKTOTAL, CKMB, CKMBINDEX, TROPONINI in the last 168 hours.  BNP: BNP (last 3 results)  Recent Labs  08/19/14 2218 08/29/14 0429 08/31/14 2128  BNP 1755.9* 1894.1* 3926.7*    ProBNP (last 3 results)  Recent Labs  03/01/14 1959 03/08/14 2230 06/05/14 0332  PROBNP 11880.0* 15334.0* 19533.0*      Other results:    Imaging: No results found.   Medications:     Scheduled Medications: . amiodarone  200 mg Oral Daily  . furosemide      . magnesium oxide  400 mg Oral BID  . metoCLOPramide  20 mg Oral BID  . metolazone  2.5 mg Oral Q24H  . polyethylene glycol  17 g Oral Daily  .  potassium chloride  60 mEq Oral 4 times per day  . sildenafil  20 mg Oral TID  . sodium chloride  3 mL Intravenous Q12H  . sorbitol  30 mL Oral Daily  . spironolactone  50 mg Oral Daily  . torsemide  60 mg Oral BID  . warfarin  2 mg Oral ONCE-1800  . warfarin   Does not apply Once  . Warfarin - Pharmacist Dosing Inpatient   Does not apply q1800    Infusions: . milrinone 0.5 mcg/kg/min (09/10/14 0757)    PRN Medications: sodium chloride, acetaminophen, acetaminophen, diphenhydrAMINE, morphine injection, ondansetron (ZOFRAN) IV, sodium chloride, sodium chloride, traMADol   Assessment:   1. Acute on chronic systolic HF (EF 16%) on home milrinone 2. Acute on CKD, stage III-IV 3. Hyperkalemia 4. PAF in NSR on amio 5. Retroperitoneal mass. 6. hyponatremia 7. PAH 8. Hypocalcemia 9. Acute on chronic respiratory failure 10.  DNR   Plan/Discussion:    Remains on milrinone. Now back on po torsemide. But on higher dose than at home. Volume status looks stable but he says he is more dyspneic and asking for IV lasix. Will give 1 dose IV lasix now and increase torsemide to 80 bid. Continue daily metolazone.  Back on spiro to help preserve K Will supp K+. Continue coumadin for AF.   I think part of his dyspnea may be related to anxiety. Hopefully can get him to [facility] by Friday. They will need ability to give him IV lasix as needed to keep him from getting readmitted quickly.     Length of Stay: 9  Glori Bickers MD 09/10/2014, 5:39 PM  Advanced Heart Failure Team Pager [phone] (M-F; 7a - 4p)  Please contact [facility] for night-coverage after hours (4p -7a ) and weekends on [email or web address]

## 2014-09-10 NOTE — Progress Notes (Signed)
ANTICOAGULATION CONSULT NOTE - Follow Up Consult  Pharmacy Consult for Coumadin Indication: Afib and h/o atrial thrombus  Allergies  Allergen Reactions  . Lisinopril Rash    Patient Measurements: Height: 5\' 8"  (172.7 cm) Weight: 136 lb 3.2 oz (61.78 kg) (c scale; pt has shoes and pants and shirt on) IBW/kg (Calculated) : 68.4  Vital Signs: Temp: 98.3 F (36.8 C) (03/23 0637) Temp Source: Oral (03/23 0637) BP: 92/50 mmHg (03/23 0637) Pulse Rate: 74 (03/23 0637)  Labs:  Recent Labs  09/08/14 0442 09/08/14 2031 09/09/14 0455 09/10/14 0520  HGB  --   --  9.7*  --   HCT  --   --  30.1*  --   PLT  --   --  331  --   LABPROT 24.3*  --  25.4* 28.8*  INR 2.16*  --  2.29* 2.69*  CREATININE 1.54* 1.83* 1.76* 1.52*    Estimated Creatinine Clearance: 34.4 mL/min (by C-G formula based on Cr of 1.52).   Assessment: 81yo male presenting to ED evening of 08/30/13 c/o CP and worsening SOB. Discharged earlier that same day for decompensated HF, multiple admissions for the same despite high-dose home milrinone.  INR supratherapeutic at 4.3, warfarin held >> warfarin resumed 3/15 when INR was therapeutic and <3.   INR remains therapeutic at 2.69. CBC is low-stable, no bleeding reported.  PTA Coumadin 2mg  TThSat, 4mg  all other days.  Goal of Therapy:  INR 2-3 Monitor platelets by anticoagulation protocol: Yes   Plan:  - Coumadin 2mg  po x1 tonight - Monitor daily INR, CBC Q72h, s/sx of bleeding  Drucie Opitz, PharmD Clinical Pharmacy Resident Pager: [phone] 09/10/2014 7:39 AM

## 2014-09-10 NOTE — Progress Notes (Signed)
Critical level K=2.6 relayed to Dr Auburn Bilberry. No orders received.

## 2014-09-11 DIAGNOSIS — E876 Hypokalemia: Secondary | ICD-10-CM

## 2014-09-11 LAB — BASIC METABOLIC PANEL
Anion gap: 13 (ref 5–15)
BUN: 65 mg/dL — AB (ref 6–23)
CO2: 30 mmol/L (ref 19–32)
Calcium: 8.8 mg/dL (ref 8.4–10.5)
Chloride: 85 mmol/L — ABNORMAL LOW (ref 96–112)
Creatinine, Ser: 1.44 mg/dL — ABNORMAL HIGH (ref 0.50–1.35)
GFR calc Af Amer: 52 mL/min — ABNORMAL LOW (ref >90)
GFR, EST NON AFRICAN AMERICAN: 45 mL/min — AB (ref >90)
Glucose, Bld: 134 mg/dL — ABNORMAL HIGH (ref 70–99)
POTASSIUM: 2.6 mmol/L — AB (ref 3.5–5.1)
Sodium: 128 mmol/L — ABNORMAL LOW (ref 135–145)

## 2014-09-11 LAB — PROTIME-INR
INR: 3.03 — ABNORMAL HIGH (ref 0.00–1.49)
PROTHROMBIN TIME: 31.7 s — AB (ref 11.6–15.2)

## 2014-09-11 MED ORDER — SPIRONOLACTONE 50 MG PO TABS
50.0000 mg | ORAL_TABLET | Freq: Two times a day (BID) | ORAL | Status: DC
  Administered 2014-09-11 – 2014-09-12 (×2): 50 mg via ORAL
  Filled 2014-09-11 (×4): qty 1

## 2014-09-11 MED ORDER — WARFARIN SODIUM 2 MG PO TABS
2.0000 mg | ORAL_TABLET | Freq: Once | ORAL | Status: AC
  Administered 2014-09-11: 2 mg via ORAL
  Filled 2014-09-11 (×2): qty 1

## 2014-09-11 NOTE — Progress Notes (Signed)
Advanced Heart Failure Rounding Note   Subjective:    Last night received IV lasix. Weight down 2 pounds overnight. (11 pounds total). Able to rest over night. Comfortable now.    Objective:   Weight Range:  Vital Signs:   Temp:  [97.7 F (36.5 C)-98.4 F (36.9 C)] 98.4 F (36.9 C) (03/24 0400) Pulse Rate:  [75-80] 78 (03/24 0400) Resp:  [18] 18 (03/24 0400) BP: (93-119)/(51-75) 98/54 mmHg (03/24 0400) SpO2:  [94 %-100 %] 97 % (03/24 0400) Weight:  [134 lb 4.2 oz (60.9 kg)] 134 lb 4.2 oz (60.9 kg) (03/24 0400) Last BM Date: 09/10/14  Weight change: Filed Weights   09/09/14 0541 09/10/14 0637 09/11/14 0400  Weight: 138 lb 4.8 oz (62.732 kg) 136 lb 3.2 oz (61.78 kg) 134 lb 4.2 oz (60.9 kg)    Intake/Output:   Intake/Output Summary (Last 24 hours) at 09/11/14 0826 Last data filed at 09/11/14 1287  Gross per 24 hour  Intake 1189.32 ml  Output   1325 ml  Net -135.68 ml     Physical Exam: General: Elderly. Lying in bed. NAD.  HEENT: normal Neck: supple. JVP 7-8Carotids 2+ bilaterally; no bruits. No lymphadenopathy or thryomegaly appreciated.  Cor: PMI laterally displaced. Regular rhythm and rate. + s3  Lungs: basilar crackles  Abdomen: soft, nontender, mildy distended. No hepatosplenomegaly. No bruits or masses. + high-pitched bowel sounds. Extremities: no cyanosis, clubbing, rash. Tr+ edema at ankles.  Neuro: alert & orientedx3, cranial nerves grossly intact. Moves all 4 extremities w/o difficulty. Affect pleasant.  Telemetry: NSR with v-pacing  Labs: Basic Metabolic Panel:  Recent Labs Lab 09/08/14 0442 09/08/14 2031 09/09/14 0455 09/09/14 1506 09/10/14 0520 09/11/14 0525  NA 127* 127* 129*  --  128* 128*  K 2.3* 3.0* 2.7* 2.6* 2.6* 2.6*  CL 80* 83* 85*  --  86* 85*  CO2 32 32 32  --  33* 30  GLUCOSE 121* 123* 132*  --  108* 134*  BUN 52* 58* 60*  --  56* 65*  CREATININE 1.54* 1.83* 1.76*  --  1.52* 1.44*  CALCIUM 8.7 8.4 8.5  --  8.4 8.8  MG  --    --   --  2.0  --   --     Liver Function Tests: No results for input(s): AST, ALT, ALKPHOS, BILITOT, PROT, ALBUMIN in the last 168 hours. No results for input(s): LIPASE, AMYLASE in the last 168 hours. No results for input(s): AMMONIA in the last 168 hours.  CBC:  Recent Labs Lab 09/06/14 0516 09/09/14 0455  WBC 12.4* 12.5*  HGB 9.5* 9.7*  HCT 28.8* 30.1*  MCV 78.9 80.3  PLT 323 331    Cardiac Enzymes: No results for input(s): CKTOTAL, CKMB, CKMBINDEX, TROPONINI in the last 168 hours.  BNP: BNP (last 3 results)  Recent Labs  08/19/14 2218 08/29/14 0429 08/31/14 2128  BNP 1755.9* 1894.1* 3926.7*    ProBNP (last 3 results)  Recent Labs  03/01/14 1959 03/08/14 2230 06/05/14 0332  PROBNP 11880.0* 15334.0* 19533.0*      Other results:    Imaging: No results found.   Medications:     Scheduled Medications: . amiodarone  200 mg Oral Daily  . magnesium oxide  400 mg Oral BID  . metoCLOPramide  20 mg Oral BID  . metolazone  2.5 mg Oral Q24H  . polyethylene glycol  17 g Oral Daily  . potassium chloride  60 mEq Oral 4 times per day  . sildenafil  20 mg Oral TID  . sodium chloride  3 mL Intravenous Q12H  . sorbitol  30 mL Oral Daily  . spironolactone  50 mg Oral Daily  . torsemide  80 mg Oral BID  . warfarin  2 mg Oral ONCE-1800  . warfarin   Does not apply Once  . Warfarin - Pharmacist Dosing Inpatient   Does not apply q1800    Infusions: . milrinone 0.5 mcg/kg/min (09/11/14 0346)    PRN Medications: sodium chloride, acetaminophen, acetaminophen, diphenhydrAMINE, morphine injection, ondansetron (ZOFRAN) IV, sodium chloride, sodium chloride, traMADol   Assessment:   1. Acute on chronic systolic HF (EF 50%) on home milrinone 2. Acute on CKD, stage III-IV 3. Hyperkalemia 4. PAF in NSR on amio 5. Retroperitoneal mass. 6. hyponatremia 7. PAH 8. Hypocalcemia 9. Acute on chronic respiratory failure 10. DNR   Plan/Discussion:   Volume  status improving. Weight down 2 pounds. Continue torsemide 80 mg twice a day.  K still low. Increase spiro to 50 mg twice a day.   Follow renal function closely.   Hopefully to SNF tomorrow.   Length of Stay: 10  CLEGG,[name] NP-C  09/11/2014, 8:26 AM  Advanced Heart Failure Team Pager [phone] (M-F; [place])  Please contact [facility] for night-coverage after hours (4p -7a ) and weekends on [email or web address]  Patient seen and examined with Darrick Grinder, NP. We discussed all aspects of the encounter. I agree with the assessment and plan as stated above.   More comfortable. Trick will be to find the right oral regimen to keep him dry. Will increase torsemide to 80 bid and also increase spiro to 50 bid to help with severe hypokalemia. Will try to get him to [facility] tomorrow if possible  Benay Spice 11:28 AM

## 2014-09-11 NOTE — Progress Notes (Signed)
ANTICOAGULATION CONSULT NOTE - Follow Up Consult  Pharmacy Consult for Coumadin Indication: Afib and h/o atrial thrombus  Allergies  Allergen Reactions  . Lisinopril Rash    Patient Measurements: Height: 5\' 8"  (172.7 cm) Weight: 134 lb 4.2 oz (60.9 kg) IBW/kg (Calculated) : 68.4  Vital Signs: Temp: 98.4 F (36.9 C) (03/24 0400) Temp Source: Oral (03/24 0400) BP: 98/54 mmHg (03/24 0400) Pulse Rate: 78 (03/24 0400)  Labs:  Recent Labs  09/08/14 2031 09/09/14 0455 09/10/14 0520 09/11/14 0525  HGB  --  9.7*  --   --   HCT  --  30.1*  --   --   PLT  --  331  --   --   LABPROT  --  25.4* 28.8* 31.7*  INR  --  2.29* 2.69* 3.03*  CREATININE 1.83* 1.76* 1.52*  --     Estimated Creatinine Clearance: 33.9 mL/min (by C-G formula based on Cr of 1.52).   Assessment: 81yo male presenting to ED evening of 08/30/13 c/o CP and worsening SOB. Discharged earlier that same day for decompensated HF, multiple admissions for the same despite high-dose home milrinone.  INR supratherapeutic at 4.3, warfarin held >> warfarin resumed 3/15 when INR was therapeutic and <3.   INR remains therapeutic today at 3. CBC is low-stable, no bleeding reported. Expected to D/C to SNF today or tomorrow  PTA Coumadin 2mg  TThSat, 4mg  all other days Recommend same dose on D/C with close f/u  Goal of Therapy:  INR 2-3 Monitor platelets by anticoagulation protocol: Yes   Plan:  - Coumadin 2mg  po x1 tonight (in line w/ home dose) - Monitor daily INR, CBC Q72h, s/sx of bleeding  Drucie Opitz, PharmD Clinical Pharmacy Resident Pager: [phone] 09/11/2014 7:35 AM

## 2014-09-11 NOTE — Progress Notes (Signed)
Pt assessed and medications passed using interpretor tonight. Pt with no complaints at this time. Pt requesting to walk in hall at this time. IV pump battery low and needs to be charged. Explained to patient that once battery charge, RN will walk in hallway with him. Pt educated that he should not get up and walk around without assistance. Pt verbalized understanding.

## 2014-09-12 LAB — CBC
HCT: 30.1 % — ABNORMAL LOW (ref 39.0–52.0)
Hemoglobin: 9.6 g/dL — ABNORMAL LOW (ref 13.0–17.0)
MCH: 25.3 pg — ABNORMAL LOW (ref 26.0–34.0)
MCHC: 31.9 g/dL (ref 30.0–36.0)
MCV: 79.2 fL (ref 78.0–100.0)
PLATELETS: 322 10*3/uL (ref 150–400)
RBC: 3.8 MIL/uL — ABNORMAL LOW (ref 4.22–5.81)
RDW: 18.3 % — AB (ref 11.5–15.5)
WBC: 11.5 10*3/uL — AB (ref 4.0–10.5)

## 2014-09-12 LAB — BASIC METABOLIC PANEL
Anion gap: 12 (ref 5–15)
BUN: 65 mg/dL — ABNORMAL HIGH (ref 6–23)
CALCIUM: 8.6 mg/dL (ref 8.4–10.5)
CO2: 29 mmol/L (ref 19–32)
Chloride: 85 mmol/L — ABNORMAL LOW (ref 96–112)
Creatinine, Ser: 1.58 mg/dL — ABNORMAL HIGH (ref 0.50–1.35)
GFR, EST AFRICAN AMERICAN: 46 mL/min — AB (ref >90)
GFR, EST NON AFRICAN AMERICAN: 40 mL/min — AB (ref >90)
Glucose, Bld: 172 mg/dL — ABNORMAL HIGH (ref 70–99)
POTASSIUM: 2.4 mmol/L — AB (ref 3.5–5.1)
SODIUM: 126 mmol/L — AB (ref 135–145)

## 2014-09-12 LAB — PROTIME-INR
INR: 2.95 — AB (ref 0.00–1.49)
Prothrombin Time: 31 seconds — ABNORMAL HIGH (ref 11.6–15.2)

## 2014-09-12 MED ORDER — WARFARIN SODIUM 4 MG PO TABS
4.0000 mg | ORAL_TABLET | Freq: Once | ORAL | Status: DC
  Filled 2014-09-12: qty 1

## 2014-09-12 MED ORDER — TRAMADOL HCL 50 MG PO TABS: 50.0000 mg | ORAL_TABLET | Freq: Two times a day (BID) | ORAL | Status: DC | PRN

## 2014-09-12 MED ORDER — SPIRONOLACTONE 25 MG PO TABS: 50.0000 mg | ORAL_TABLET | Freq: Two times a day (BID) | ORAL | Status: DC

## 2014-09-12 MED ORDER — POTASSIUM CHLORIDE 10 MEQ/100ML IV SOLN
10.0000 meq | INTRAVENOUS | Status: AC
  Administered 2014-09-12 (×3): 10 meq via INTRAVENOUS
  Filled 2014-09-12 (×3): qty 100

## 2014-09-12 MED ORDER — MILRINONE IN DEXTROSE 20 MG/100ML IV SOLN: 0.5000 ug/kg/min | INTRAVENOUS | Status: DC

## 2014-09-12 MED ORDER — POTASSIUM CHLORIDE CRYS ER 20 MEQ PO TBCR: 60.0000 meq | EXTENDED_RELEASE_TABLET | Freq: Two times a day (BID) | ORAL | Status: DC

## 2014-09-12 MED ORDER — TRAMADOL HCL 50 MG PO TABS
50.0000 mg | ORAL_TABLET | Freq: Two times a day (BID) | ORAL | Status: DC | PRN
Start: 2014-09-12 — End: 2014-09-29

## 2014-09-12 MED ORDER — METOLAZONE 5 MG PO TABS: ORAL_TABLET | ORAL | Status: DC

## 2014-09-12 MED ORDER — FUROSEMIDE 40 MG PO TABS
40.0000 mg | ORAL_TABLET | ORAL | Status: AC
  Administered 2014-09-12 (×3): 40 mg via ORAL
  Filled 2014-09-12 (×3): qty 1

## 2014-09-12 MED ORDER — TORSEMIDE 20 MG PO TABS: 80.0000 mg | ORAL_TABLET | Freq: Two times a day (BID) | ORAL | Status: DC

## 2014-09-12 NOTE — Progress Notes (Signed)
Pt attempting to walk in hallway, pt steady on feet.  Pt IV pole states less than 30 min battery left. Pt requesting to sit at nurses station. Interpretor used to explain to patient that he must remain in room in order to charge IV pole. Pt verbalized understanding.

## 2014-09-12 NOTE — Progress Notes (Signed)
I cosign all documentation and medication administration by Tina Griffiths, student RN for this shift.

## 2014-09-12 NOTE — Progress Notes (Signed)
Potassium pill found on floor. This RN spoke with patient using interpretor. Pt states he took 2 potassium pills this morning and does not know how the pill got on the floor. Per interpretor pt states that he will take the pill if he wants to and that this nurse is being too demanding. Pt states he just wants orange juice and water.

## 2014-09-12 NOTE — Progress Notes (Signed)
ANTICOAGULATION CONSULT NOTE - Follow Up Consult  Pharmacy Consult for Coumadin Indication: Afib and h/o atrial thrombus  Allergies  Allergen Reactions  . Lisinopril Rash    Patient Measurements: Height: 5\' 8"  (172.7 cm) Weight: 138 lb (62.596 kg) (scale B) IBW/kg (Calculated) : 68.4  Vital Signs: Temp: 98 F (36.7 C) (03/25 0529) Temp Source: Oral (03/25 0529) BP: 94/35 mmHg (03/25 0529) Pulse Rate: 74 (03/25 0529)  Labs:  Recent Labs  09/10/14 0520 09/11/14 0525 09/12/14 0505  HGB  --   --  9.6*  HCT  --   --  30.1*  PLT  --   --  322  LABPROT 28.8* 31.7* 31.0*  INR 2.69* 3.03* 2.95*  CREATININE 1.52* 1.44* 1.58*    Estimated Creatinine Clearance: 33.6 mL/min (by C-G formula based on Cr of 1.58).   Assessment: 81yo male presenting to ED evening of 08/30/13 c/o CP and worsening SOB. Discharged earlier that same day for decompensated HF, multiple admissions for the same despite high-dose home milrinone.  INR supratherapeutic at 4.3, warfarin held >> warfarin resumed 3/15 when INR was therapeutic and <3.   INR remains therapeutic today at 2.95. CBC is low-stable, no bleeding reported. Expected to D/C to SNF soon  PTA Coumadin 2mg  TThSat, 4mg  all other days Recommend same dose on D/C with close f/u  Goal of Therapy:  INR 2-3 Monitor platelets by anticoagulation protocol: Yes   Plan:  - Coumadin 4mg  po x1 tonight (in line w/ home dose) - Monitor daily INR, CBC Q72h, s/sx of bleeding  Drucie Opitz, PharmD Clinical Pharmacy Resident Pager: [phone] 09/12/2014 10:02 AM

## 2014-09-12 NOTE — Progress Notes (Signed)
CRITICAL VALUE ALERT  Critical value received:  Potassium 2.4  Date of notification:  09/12/2014   Time of notification:  0640   Critical value read back:Yes.    Nurse who received alert:  Ronnette Hila, RN   MD notified (1st page):  Rosaria Ferries  Time of first page:  0645   Responding MD:  Rosaria Ferries  Time MD responded:  06:49, new order for 3 runs of potassium

## 2014-09-12 NOTE — Discharge Summary (Signed)
Advanced Heart Failure Team  Discharge Summary   Patient ID: Tyler Christensen MRN: [id number], DOB/AGE: 07-28-1932 81 y.o. Admit date: 08/31/2014 D/C date:     09/12/2014   Primary Discharge Diagnoses:   1. Acute on chronic systolic HF (EF 73%) on home milrinone 0.5 mcg via PICC. Has [employer] CRT-D  2. Acute on CKD, stage III-IV 3. Hyperkalemia 4. PAF in NSR on amio 200 mg daily. Chronic Coumadin.  5. Retroperitoneal mass. 6. hyponatremia 7. PAH- on revatio  8. Hypocalcemia 9. Acute on chronic respiratory failure 10. DNR   Hospital Course:  Tyler Christensen is a 81 y.o. male [name] (non-english speaking-spanish speaking) w/ PMHx significant for chronic systolic CHF (EF 41%), NICM, LBBB, PAF (on coumadin), HTN, HLD, CVA, Pulmonary Hypertension and h/o seizure d/o (2/2 CVA 05/2012). Has had numerous admissions recently for decompensated HF despite high-dose milrinone at 0.38mcg/kg/min.   He presented to [facility] ED with volume overload, hypotension, and dyspnea after just being discharged on the day before. He required levophed short term due to hypotension. Through his hopsitalization he continued on milrinone at 0.5 mcg. He was diuresed with lasix drip + metolazone. As his volume status impropved he transitioned to torsemide 80 mg twice a day with metolazone 5 mg every Mon-Wed-Fri. Overall he diuresed 14 pounds. Due to ongoing going difficulty with hypokalemia, spironolactone was increased to 50 mg twice a day + K Dur 60 meq twice a day.   He remained in NSR with V pacing. He will continue low dose amiodarone as well as coumadin to maintain INR 2.0-3.0 SNF to check INR weekly adjust accordingly. Lengthy discussion occurred with him about his deteriorating condition and at the conclusion he elected DNR. Also out of facility DNR has been completed.  Due to frequent readmission and the uncertainty of his ability to manage his medications at home, SNF was pursued. He is being discharged to [facility]  in stable condition. He will need BMET every Monday and Friday faxed to HF clinic. He will continued to be followed closely in the HF clinic.     Discharge Weight Range:  Weight 138 pounds.  Discharge Vitals: Blood pressure 94/35, pulse 74, temperature 98 F (36.7 C), temperature source Oral, resp. rate 18, height 5\' 8"  (1.727 m), weight 138 lb (62.596 kg), SpO2 93 %.  Labs: Lab Results  Component Value Date   WBC 11.5* 09/12/2014   HGB 9.6* 09/12/2014   HCT 30.1* 09/12/2014   MCV 79.2 09/12/2014   PLT 322 09/12/2014     Recent Labs Lab 09/12/14 0505  NA 126*  K 2.4*  CL 85*  CO2 29  BUN 65*  CREATININE 1.58*  CALCIUM 8.6  GLUCOSE 172*   Lab Results  Component Value Date   CHOL 128 06/12/2012   HDL 27* 06/12/2012   LDLCALC 78 06/12/2012   TRIG 113 06/12/2012   BNP (last 3 results)  Recent Labs  08/19/14 2218 08/29/14 0429 08/31/14 2128  BNP 1755.9* 1894.1* 3926.7*    ProBNP (last 3 results)  Recent Labs  03/01/14 1959 03/08/14 2230 06/05/14 0332  PROBNP 11880.0* 15334.0* 19533.0*     Diagnostic Studies/Procedures   No results found.  Discharge Medications     Medication List    TAKE these medications        acetaminophen 325 MG tablet  Commonly known as:  TYLENOL  Take 2 tablets (650 mg total) by mouth every 6 (six) hours as needed for mild pain (or Fever >/=  101).     amiodarone 200 MG tablet  Commonly known as:  PACERONE  Take 1 tablet (200 mg total) by mouth daily.     magnesium oxide 400 MG tablet  Commonly known as:  MAG-OX  Take 1 tablet (400 mg total) by mouth 2 (two) times daily.     metoCLOPramide 10 MG tablet  Commonly known as:  REGLAN  Take 1 tablet (10 mg total) by mouth 2 (two) times daily.     metolazone 5 MG tablet  Commonly known as:  ZAROXOLYN  Take 5 mg every Mon-Wed-Fri     milrinone 20 MG/100ML Soln infusion  Commonly known as:  PRIMACOR  Inject 33.95 mcg/min into the vein continuous.     polyethylene  glycol packet  Commonly known as:  MIRALAX / GLYCOLAX  Take 17 g by mouth daily.     potassium chloride SA 20 MEQ tablet  Commonly known as:  K-DUR,KLOR-CON  Take 3 tablets (60 mEq total) by mouth 2 (two) times daily.     sildenafil 20 MG tablet  Commonly known as:  REVATIO  Take 1 tablet (20 mg total) by mouth 3 (three) times daily.     sorbitol 70 % Soln  Take 30 mLs by mouth daily.     spironolactone 25 MG tablet  Commonly known as:  ALDACTONE  Take 2 tablets (50 mg total) by mouth 2 (two) times daily.     torsemide 20 MG tablet  Commonly known as:  DEMADEX  Take 4 tablets (80 mg total) by mouth 2 (two) times daily.     traMADol 50 MG tablet  Commonly known as:  ULTRAM  Take 1 tablet (50 mg total) by mouth 2 (two) times daily as needed for moderate pain.     traMADol 50 MG tablet  Commonly known as:  ULTRAM  Take 1 tablet (50 mg total) by mouth 2 (two) times daily as needed. for pain     warfarin 4 MG tablet  Commonly known as:  COUMADIN  Take 4 mg Monday, Wednesday, Friday and Sunday. 2 mg on Tues, Thurs and Sat        Disposition   The patient will be discharged in stable condition to home. Discharge Instructions    Contraindication to ARB at discharge    Complete by:  As directed      Diet - low sodium heart healthy    Complete by:  As directed      Heart Failure patients record your daily weight using the same scale at the same time of day    Complete by:  As directed      Increase activity slowly    Complete by:  As directed           Follow-up Information    Follow up with Glori Bickers, MD On 09/25/2014.   Specialty:  Cardiology   Why:  at 12:00 noon in the Advanced Heart Failure Clinic--gate code [id number]--please bring all medications   Contact information:   [place] [address] [phone]         Duration of Discharge Encounter: Greater than 35 minutes   Signed, CLEGG,[name] NP-C  09/12/2014, 12:01  PM  Patient seen and examined with Darrick Grinder, NP. We discussed all aspects of the encounter. I agree with the assessment and plan as stated above.   He is ready for d/c today to SNF. Diuretics have been adjusted. Hypokalemia has been am ajor  issue particularly in light of his unwillingness to take KCL supplements. We will supplement prior to discharge and watch closely with routine BMETs.   Benay Spice 12:39 PM

## 2014-09-12 NOTE — Progress Notes (Addendum)
CSW (Clinical Education officer, museum) prepared pt dc packet and placed with shadow chart. CSW arranged non-emergent ambulance transport. Pt family (son Costella Hatcher), pt nurse, and facility informed. Pt notified by MD of dc.  CSW signing off.   ADDENDUM: CSW had in length discussion with pt (at bedside) and pt wife over the phone using [place] phone interpreter. Both pt and pt wife explained that they do not want SNF. They instead want pt to dc home. Pt wife agreeable potentially to SNF but she is wanting pt to dc home and then she would like to take pt to visit [facility] and then they will make their decision. CSW informed pt wife that this is not recommended but is possible. They are aware that hospital stay will make pt Medicare eligible for SNF for 30 days. Throughout discussion, pt declined SNF and stated he would like to dc home. CSW ended conversation with pt and pt wife with plan being for dc home and family to provide transportation. Pt wife asking for CSW to leave facility address at pt bedside. Pt wife can be reached at [phone] or [phone]. All medical team staff members made aware of change in plans. RN made aware that family will need to be contacted at time of dc for transport. Facility and non-emergent ambulance transport notified. CSW signing off.  [place], [place]

## 2014-09-12 NOTE — Progress Notes (Signed)
Patients wife was given discharge in spainish and  She read the instructions ,.Says she understand the orders and the orders  concerning medications.( Tyler Christensen)

## 2014-09-12 NOTE — Progress Notes (Signed)
Pt with 3 potassium pills due this AM. RN went with student RN to room to give pills. Pt took 2 potassium pills and threw third pill in trash. Pt stats he only wants to take 2 pills this morning. Will continue to monitor. Ronnette Hila, RN

## 2014-09-12 NOTE — Progress Notes (Signed)
Advanced Heart Failure Rounding Note   Subjective:   Back on torsemide. Feels better. Weight up 4 pounds? . K down to 2.4 . Getting runs of K + po. Only took part of potassium and threw some in the trash.    Objective:   Weight Range:  Vital Signs:   Temp:  [97.5 F (36.4 C)-98.7 F (37.1 C)] 98 F (36.7 C) (03/25 0529) Pulse Rate:  [73-87] 74 (03/25 0529) Resp:  [18] 18 (03/25 0529) BP: (75-101)/(35-58) 94/35 mmHg (03/25 0529) SpO2:  [93 %-98 %] 93 % (03/25 0529) Weight:  [138 lb (62.596 kg)] 138 lb (62.596 kg) (03/25 0529) Last BM Date: 09/10/14  Weight change: Filed Weights   09/10/14 0637 09/11/14 0400 09/12/14 0529  Weight: 136 lb 3.2 oz (61.78 kg) 134 lb 4.2 oz (60.9 kg) 138 lb (62.596 kg)    Intake/Output:   Intake/Output Summary (Last 24 hours) at 09/12/14 1004 Last data filed at 09/12/14 0606  Gross per 24 hour  Intake   1140 ml  Output    650 ml  Net    490 ml     Physical Exam: General: Elderly. Lying in bed. NAD. +dyspnea HEENT: normal Neck: supple. JVP 6-7 Carotids 2+ bilaterally; no bruits. No lymphadenopathy or thryomegaly appreciated.  Cor: PMI laterally displaced. Regular rhythm and rate. + s3  Lungs: basilar crackles  Abdomen: soft, nontender, non distended. No hepatosplenomegaly. No bruits or masses. + high-pitched bowel sounds. Extremities: no cyanosis, clubbing, rash. Tr-1+ edema at ankles.  Neuro: alert & orientedx3, cranial nerves grossly intact. Moves all 4 extremities w/o difficulty. Affect pleasant.  Telemetry: NSR with v-pacing  Labs: Basic Metabolic Panel:  Recent Labs Lab 09/08/14 2031 09/09/14 0455 09/09/14 1506 09/10/14 0520 09/11/14 0525 09/12/14 0505  NA 127* 129*  --  128* 128* 126*  K 3.0* 2.7* 2.6* 2.6* 2.6* 2.4*  CL 83* 85*  --  86* 85* 85*  CO2 32 32  --  33* 30 29  GLUCOSE 123* 132*  --  108* 134* 172*  BUN 58* 60*  --  56* 65* 65*  CREATININE 1.83* 1.76*  --  1.52* 1.44* 1.58*  CALCIUM 8.4 8.5  --  8.4 8.8  8.6  MG  --   --  2.0  --   --   --     Liver Function Tests: No results for input(s): AST, ALT, ALKPHOS, BILITOT, PROT, ALBUMIN in the last 168 hours. No results for input(s): LIPASE, AMYLASE in the last 168 hours. No results for input(s): AMMONIA in the last 168 hours.  CBC:  Recent Labs Lab 09/06/14 0516 09/09/14 0455 09/12/14 0505  WBC 12.4* 12.5* 11.5*  HGB 9.5* 9.7* 9.6*  HCT 28.8* 30.1* 30.1*  MCV 78.9 80.3 79.2  PLT 323 331 322    Cardiac Enzymes: No results for input(s): CKTOTAL, CKMB, CKMBINDEX, TROPONINI in the last 168 hours.  BNP: BNP (last 3 results)  Recent Labs  08/19/14 2218 08/29/14 0429 08/31/14 2128  BNP 1755.9* 1894.1* 3926.7*    ProBNP (last 3 results)  Recent Labs  03/01/14 1959 03/08/14 2230 06/05/14 0332  PROBNP 11880.0* 15334.0* 19533.0*      Other results:    Imaging: No results found.   Medications:     Scheduled Medications: . amiodarone  200 mg Oral Daily  . magnesium oxide  400 mg Oral BID  . metoCLOPramide  20 mg Oral BID  . metolazone  2.5 mg Oral Q24H  . polyethylene glycol  17  g Oral Daily  . potassium chloride  10 mEq Intravenous Q1 Hr x 3  . potassium chloride  60 mEq Oral 4 times per day  . sildenafil  20 mg Oral TID  . sodium chloride  3 mL Intravenous Q12H  . sorbitol  30 mL Oral Daily  . spironolactone  50 mg Oral BID  . torsemide  80 mg Oral BID  . warfarin   Does not apply Once  . Warfarin - Pharmacist Dosing Inpatient   Does not apply q1800    Infusions: . milrinone 0.5 mcg/kg/min (09/12/14 0753)    PRN Medications: sodium chloride, acetaminophen, acetaminophen, diphenhydrAMINE, morphine injection, ondansetron (ZOFRAN) IV, sodium chloride, sodium chloride, traMADol   Assessment:   1. Acute on chronic systolic HF (EF 60%) on home milrinone 2. Acute on CKD, stage III-IV 3. Hyperkalemia 4. PAF in NSR on amio 5. Retroperitoneal mass. 6. hyponatremia 7. PAH 8. Hypocalcemia 9. Acute on  chronic respiratory failure 10. DNR   Plan/Discussion:    Remains on milrinone. Continue torsemide. Can add metolazone 2.5 mg daily. Continue spiro 50 mg twice a day.   Hard to manage potassium as he is not taking all potassium. Threw some potassium in the trash. We discuss and reinforced medication compliance.    Continue coumadin for AF.   Possible d/c to SNF today.    Length of Stay: 11  CLEGG,[name] NP-C  09/12/2014, 10:04 AM  Advanced Heart Failure Team Pager [phone] (M-F; 7a - 4p)  Please contact [facility] for night-coverage after hours (4p -7a ) and weekends on [email or web address]  Volume status looks god on torsemide 80 bid and spiro 50 bid. Will need metolazone 5mg  every Monday, Wednesday and Friday. K is persistently low and he continues to refuse potassium. Stressed need to take. Will need 60 bid. Check BMET every Monday and Friday for several weeks. Will see back in HF Clinic next week.   Will encourage him to supp potassium (will give 40kcl x 3) prior to d/c to SNF today.   Marquett Bertoli,MD 10:53 AM

## 2014-09-15 ENCOUNTER — Encounter (HOSPITAL_COMMUNITY): Payer: Self-pay | Admitting: Emergency Medicine

## 2014-09-15 ENCOUNTER — Emergency Department (HOSPITAL_COMMUNITY)
Admission: EM | Admit: 2014-09-15 | Discharge: 2014-09-15 | Disposition: A | Discharge status: Home / Self Care | Payer: Medicare Other | Source: Home / Self Care | Attending: Emergency Medicine | Admitting: Emergency Medicine

## 2014-09-15 ENCOUNTER — Emergency Department (HOSPITAL_COMMUNITY): Payer: Medicare Other

## 2014-09-15 ENCOUNTER — Other Ambulatory Visit (HOSPITAL_COMMUNITY): Payer: Self-pay

## 2014-09-15 DIAGNOSIS — I5043 Acute on chronic combined systolic (congestive) and diastolic (congestive) heart failure: Secondary | ICD-10-CM

## 2014-09-15 DIAGNOSIS — D649 Anemia, unspecified: Secondary | ICD-10-CM

## 2014-09-15 DIAGNOSIS — Z7901 Long term (current) use of anticoagulants: Secondary | ICD-10-CM

## 2014-09-15 DIAGNOSIS — N289 Disorder of kidney and ureter, unspecified: Secondary | ICD-10-CM

## 2014-09-15 DIAGNOSIS — E871 Hypo-osmolality and hyponatremia: Secondary | ICD-10-CM

## 2014-09-15 DIAGNOSIS — I5023 Acute on chronic systolic (congestive) heart failure: Secondary | ICD-10-CM | POA: Diagnosis not present

## 2014-09-15 DIAGNOSIS — R0600 Dyspnea, unspecified: Secondary | ICD-10-CM | POA: Diagnosis not present

## 2014-09-15 LAB — CBC
HCT: 33.8 % — ABNORMAL LOW (ref 39.0–52.0)
HEMOGLOBIN: 10.8 g/dL — AB (ref 13.0–17.0)
MCH: 25.4 pg — ABNORMAL LOW (ref 26.0–34.0)
MCHC: 32 g/dL (ref 30.0–36.0)
MCV: 79.3 fL (ref 78.0–100.0)
Platelets: 384 10*3/uL (ref 150–400)
RBC: 4.26 MIL/uL (ref 4.22–5.81)
RDW: 18.4 % — AB (ref 11.5–15.5)
WBC: 15.9 10*3/uL — ABNORMAL HIGH (ref 4.0–10.5)

## 2014-09-15 LAB — BASIC METABOLIC PANEL
Anion gap: 13 (ref 5–15)
BUN: 66 mg/dL — ABNORMAL HIGH (ref 6–23)
CHLORIDE: 89 mmol/L — AB (ref 96–112)
CO2: 24 mmol/L (ref 19–32)
Calcium: 9.4 mg/dL (ref 8.4–10.5)
Creatinine, Ser: 1.93 mg/dL — ABNORMAL HIGH (ref 0.50–1.35)
GFR calc non Af Amer: 31 mL/min — ABNORMAL LOW (ref >90)
GFR, EST AFRICAN AMERICAN: 36 mL/min — AB (ref >90)
Glucose, Bld: 206 mg/dL — ABNORMAL HIGH (ref 70–99)
POTASSIUM: 4.4 mmol/L (ref 3.5–5.1)
Sodium: 126 mmol/L — ABNORMAL LOW (ref 135–145)

## 2014-09-15 LAB — PROTIME-INR
INR: 2.49 — AB (ref 0.00–1.49)
Prothrombin Time: 27.1 seconds — ABNORMAL HIGH (ref 11.6–15.2)

## 2014-09-15 LAB — I-STAT TROPONIN, ED: Troponin i, poc: 0.03 ng/mL (ref 0.00–0.08)

## 2014-09-15 LAB — BRAIN NATRIURETIC PEPTIDE: B Natriuretic Peptide: 2970.9 pg/mL — ABNORMAL HIGH (ref 0.0–100.0)

## 2014-09-15 MED ORDER — LORAZEPAM 1 MG PO TABS: 1.0000 mg | ORAL_TABLET | Freq: Three times a day (TID) | ORAL | Status: DC | PRN

## 2014-09-15 NOTE — ED Provider Notes (Signed)
CSN: [id number]     Arrival date & time 09/15/14  0149 History  This chart was scribed for Delora Fuel, MD by Molli Posey, ED Scribe. This patient was seen in room A09C/A09C and the patient's care was started 2:34 AM.     Chief Complaint  Patient presents with  . Chest Pain   The history is provided by the patient. No language interpreter was used.   HPI Comments: Tyler Christensen is a 81 y.o. male with a history of HTN, CHF, SOB, MI and CAD who presents to the Emergency Department complaining of right sided CP that started PTA. Pt states that he has associated SOB as well. Pt reports no alleviating or modifying factors at this time. He denies nausea, vomiting and fever as symptoms.    Past Medical History  Diagnosis Date  . HTN (hypertension)   . High cholesterol   . CHF (congestive heart failure)   . BBBB (bilateral bundle branch block)   . LBBB (left bundle branch block)   . Seizures 06/11/2012    new onset/notes (06/11/2012)  . SOB (shortness of breath)     "sometimes when I lay down;; related to not taking my RX" (06/11/2012)  . Myocardial infarction     06/10/2012  . Atrial fibrillation   . Stroke   . Atrial thrombus     left  . Pulmonary HTN   . Atrial fibrillation 06/12/2012  . Coronary artery disease    Past Surgical History  Procedure Laterality Date  . Throat surgery  1940[birth year]    "and nose" (06/11/2012); ?T&A  . Inguinal hernia repair  ~ 2006-10-16    "both sides" (06/11/2012)  . Tee without cardioversion  06/29/2012    Procedure: TRANSESOPHAGEAL ECHOCARDIOGRAM (TEE);  Surgeon: Jolaine Artist, MD;  Location: [facility] ENDOSCOPY;   . Cardioversion N/A 08/14/2012    Procedure: CARDIOVERSION;  Surgeon: Jolaine Artist, MD;  Location: [facility] ENDOSCOPY;  . Tee without cardioversion N/A 11/12/2013    Procedure: TRANSESOPHAGEAL ECHOCARDIOGRAM (TEE);  Surgeon: Candee Furbish, MD;  Location: [facility] ENDOSCOPY;   . Tee without cardioversion N/A 11/19/2013    Procedure: TRANSESOPHAGEAL  ECHOCARDIOGRAM (TEE);  Surgeon: Jolaine Artist, MD;  Location: [facility] ENDOSCOPY;   . Cardioversion N/A 12/27/2013    Procedure: CARDIOVERSION;  Surgeon: Jolaine Artist, MD;  Location: [facility] ENDOSCOPY;   . Left and right heart catheterization with coronary angiogram N/A 07/02/2012    Procedure: LEFT AND RIGHT HEART CATHETERIZATION WITH CORONARY ANGIOGRAM;  Surgeon: Jolaine Artist, MD;  Location: [facility] CATH LAB; Mild non-obstructive CAD, severe PAH, PA = 93/42 (59), severe NICM w/ EF 15%  . Right heart catheterization N/A 07/06/2012    Procedure: RIGHT HEART CATH;  Surgeon: Jolaine Artist, MD; to assess response to milrinone; Moderate PH with profound reduction in PVR with milrinone/PDE-5 inhibitor   . Bi-ventricular implantable cardioverter defibrillator N/A 10/26/2012    Procedure: BI-VENTRICULAR IMPLANTABLE CARDIOVERTER DEFIBRILLATOR  (CRT-D);  Surgeon: Evans Lance, MD;  Medtronic Evera XT CRTD, BiV ICD serial #[id number]     Family History  Problem Relation Age of Onset  . Heart attack      father had MI in his 33s, no fhx of early heart problem before age 64  . Heart attack      mother died of MI around 16-Oct-1998   History  Substance Use Topics  . Smoking status: Never Smoker   . Smokeless tobacco: Never Used  . Alcohol Use: No    Review  of Systems  Constitutional: Negative for fever.  Respiratory: Positive for shortness of breath.   Cardiovascular: Positive for chest pain.  Gastrointestinal: Negative for nausea and vomiting.  All other systems reviewed and are negative.     Allergies  Lisinopril  Home Medications   Prior to Admission medications   Medication Sig Start Date End Date Taking? Authorizing Provider  acetaminophen (TYLENOL) 325 MG tablet Take 2 tablets (650 mg total) by mouth every 6 (six) hours as needed for mild pain (or Fever >/= 101). 07/10/14   Kinnie Feil, MD  amiodarone (PACERONE) 200 MG tablet Take 1 tablet (200 mg total) by mouth daily. 07/10/14    Kinnie Feil, MD  magnesium oxide (MAG-OX) 400 MG tablet Take 1 tablet (400 mg total) by mouth 2 (two) times daily. 04/25/14   Jolaine Artist, MD  metoCLOPramide (REGLAN) 10 MG tablet Take 1 tablet (10 mg total) by mouth 2 (two) times daily. 07/31/14   Jolaine Artist, MD  metolazone (ZAROXOLYN) 5 MG tablet Take 5 mg every Mon-Wed-Fri 09/12/14   [name] D Clegg, NP  milrinone (PRIMACOR) 20 MG/100ML SOLN infusion Inject 33.95 mcg/min into the vein continuous. 06/10/14   Rande Brunt, NP  polyethylene glycol [facility] / GLYCOLAX) packet Take 17 g by mouth daily. 08/23/14   Isaiah Serge, NP  potassium chloride SA (K-DUR,KLOR-CON) 20 MEQ tablet Take 3 tablets (60 mEq total) by mouth 2 (two) times daily. 09/12/14   [name] D Ninfa Meeker, NP  sildenafil (REVATIO) 20 MG tablet Take 1 tablet (20 mg total) by mouth 3 (three) times daily. 03/07/14   Rande Brunt, NP  sorbitol 70 % SOLN Take 30 mLs by mouth daily. 07/17/14   Jolaine Artist, MD  spironolactone (ALDACTONE) 25 MG tablet Take 2 tablets (50 mg total) by mouth 2 (two) times daily. 09/12/14   [name] D Ninfa Meeker, NP  torsemide (DEMADEX) 20 MG tablet Take 4 tablets (80 mg total) by mouth 2 (two) times daily. 09/12/14   [name] D Ninfa Meeker, NP  traMADol (ULTRAM) 50 MG tablet Take 1 tablet (50 mg total) by mouth 2 (two) times daily as needed. for pain 09/12/14   Conrad Staunton, NP  warfarin (COUMADIN) 4 MG tablet Take 4 mg Monday, Wednesday, Friday and Sunday. 2 mg on Tues, Thurs and Sat 06/10/14   Rande Brunt, NP   BP 107/53 mmHg  Pulse 92  Temp(Src) 97.7 F (36.5 C) (Oral)  Resp 16  SpO2 96% Physical Exam  Constitutional: He is oriented to person, place, and time. He appears well-developed and well-nourished.  HENT:  Head: Normocephalic and atraumatic.  Eyes: EOM are normal. Pupils are equal, round, and reactive to light. Right eye exhibits no discharge. Left eye exhibits no discharge.  Neck: Normal range of motion. Neck supple. No JVD present.  Cardiovascular:  Normal rate, regular rhythm and normal heart sounds.   No murmur heard. Pulmonary/Chest: Effort normal and breath sounds normal. He has no wheezes. He has no rales. He exhibits no tenderness.  Abdominal: Soft. Bowel sounds are normal. He exhibits no distension and no mass. There is no tenderness.  Musculoskeletal: Normal range of motion. He exhibits no tenderness.  1+ pretibial edema  Lymphadenopathy:    He has no cervical adenopathy.  Neurological: He is alert and oriented to person, place, and time. No cranial nerve deficit. He exhibits normal muscle tone. Coordination normal.  Skin: Skin is warm and dry. No rash noted.  Psychiatric: He has  a normal mood and affect. His behavior is normal. Thought content normal.  Nursing note and vitals reviewed.   ED Course  Procedures   DIAGNOSTIC STUDIES: Oxygen Saturation is 96% on [place], normal by my interpretation.    COORDINATION OF CARE: 2:37 AM Discussed treatment plan with pt at bedside and pt agreed to plan.   Labs Review Results for orders placed or performed during the hospital encounter of 09/15/14  CBC  Result Value Ref Range   WBC 15.9 (H) 4.0 - 10.5 K/uL   RBC 4.26 4.22 - 5.81 MIL/uL   Hemoglobin 10.8 (L) 13.0 - 17.0 g/dL   HCT 33.8 (L) 39.0 - 52.0 %   MCV 79.3 78.0 - 100.0 fL   MCH 25.4 (L) 26.0 - 34.0 pg   MCHC 32.0 30.0 - 36.0 g/dL   RDW 18.4 (H) 11.5 - 15.5 %   Platelets 384 150 - 400 K/uL  Basic metabolic panel  Result Value Ref Range   Sodium 126 (L) 135 - 145 mmol/L   Potassium 4.4 3.5 - 5.1 mmol/L   Chloride 89 (L) 96 - 112 mmol/L   CO2 24 19 - 32 mmol/L   Glucose, Bld 206 (H) 70 - 99 mg/dL   BUN 66 (H) 6 - 23 mg/dL   Creatinine, Ser 1.93 (H) 0.50 - 1.35 mg/dL   Calcium 9.4 8.4 - 10.5 mg/dL   GFR calc non Af Amer 31 (L) >90 mL/min   GFR calc Af Amer 36 (L) >90 mL/min   Anion gap 13 5 - 15  BNP (order ONLY if patient complains of dyspnea/SOB AND you have documented it for THIS visit)  Result Value Ref Range    B Natriuretic Peptide 2970.9 (H) 0.0 - 100.0 pg/mL  Protime-INR  Result Value Ref Range   Prothrombin Time 27.1 (H) 11.6 - 15.2 seconds   INR 2.49 (H) 0.00 - 1.49  I-stat troponin, ED (not at [facility])  Result Value Ref Range   Troponin i, poc 0.03 0.00 - 0.08 ng/mL   Comment 3            Imaging Review Dg Chest 2 View  09/15/2014   CLINICAL DATA:  Increasing shortness of breath.  Chest pain.  EXAM: CHEST  2 VIEW  COMPARISON:  08/31/2014  FINDINGS: Multi lead left-sided pacemaker remains in place. Tip of the right central line in the atrial caval junction. Cardiomegaly is unchanged. Pulmonary edema is unchanged. Trace bilateral pleural effusions. No confluent airspace disease. No pneumothorax.  IMPRESSION: Cardiomegaly and pulmonary edema, unchanged from prior exam. There are trace bilateral pleural effusions.   Electronically Signed   By: Jeb Levering M.D.   On: 09/15/2014 02:30     EKG Interpretation   Date/Time:  Monday September 15 2014 01:56:04 EDT Ventricular Rate:  90 PR Interval:  152 QRS Duration: 190 QT Interval:  468 QTC Calculation: 572 R Axis:   -114 Text Interpretation:  Atrial-sensed ventricular-paced rhythm Abnormal ECG  When compared with ECG of 09/10/2014, No significant change was found  Confirmed by [facility]  MD, Kaseem Vastine ([id number]) on 09/15/2014 2:03:22 AM      MDM   Final diagnoses:  Acute on chronic systolic and diastolic heart failure, NYHA class 4  Hyponatremia  Renal insufficiency  Normocytic anemia  Anticoagulated on warfarin    Patient with known end-stage nonischemic cardiomyopathy maintained on milrinone infusion. He does not appear severely fluid overloaded currently. X-ray shows no change from baseline and BNP has decreased from last  recorded value. There has been a slight increase in creatinine, so I would be hesitant about being more aggressive with diuretics. Because of complexity of case, cardiology was consulted who felt that he could safely go back to  his nursing care facility. At this point, I suspect that some of his dyspnea is anxiety related so he will be given a prescription for lorazepam to see if it gives subjective improvement. He is to follow-up with the heart failure clinic.  I personally performed the services described in this documentation, which was scribed in my presence. The recorded information has been reviewed and is accurate.       Delora Fuel, MD 54/98/26 [id number]

## 2014-09-15 NOTE — ED Notes (Addendum)
Per the interpreter phone, pt is not having any pain at all, pt states "I am just having trouble breathing, I am having difficulty to breathe, I am breathing very badly, what I need is a shot to help me breathe better"

## 2014-09-15 NOTE — Discharge Instructions (Signed)
Insuficiencia cardaca (Heart Failure) La insuficiencia cardaca es una afeccin en la que el corazn tiene dificultad para bombear la [place]. El corazn no bombea sangre de [place] eficiente para que el organismo pueda funcionar bien. En algunos casos de insuficiencia cardaca, los lquidos vuelven a los pulmones, o puede haber hinchazn (edema) en la zona inferior de las piernas. La insuficiencia cardaca por lo general es una enfermedad de larga duracin (crnica). Es importante que se cuide mucho y que siga el plan de tratamiento que le indique su mdico. CAUSAS  Algunas enfermedades y condiciones pueden causar insuficiencia cardaca. Estas incluyen lo siguiente:  Presin arterial elevada (hipertensin). La hipertensin hace que el msculo cardaco trabaje ms de lo normal. Cuando la presin en los vasos sanguneos es alta, el corazn tiene que bombear (contraerse) con ms fuerza para Armed forces technical officer la sangre por todo el cuerpo. La hipertensin arterial hace que con el tiempo el corazn se vuelva rgido y dbil.  Enfermedad arterial coronaria (EAC). La enfermedad arterial coronaria es la acumulacin de colesterol y grasa (placa) en las arterias del corazn. La obstruccin de las arterias priva al msculo cardaco de sangre y oxgeno. Esto ocasiona dolor en el pecho y puede conducir a un infarto. La hipertensin arterial tambin favorece la enfermedad arterial coronaria.  Ataque cardaco (infarto de miocardio). El ataque cardaco se produce cuando se obstruye una o ms arterias del corazn. La prdida de oxgeno daa el tejido muscular cardaco. Cuando esto ocurre, una parte del msculo cardaco muere. El tejido daado no se contrae bien y Public house manager la capacidad del corazn para Chiropodist.  Vlvulas cardacas anormales. Cuando las vlvulas cardacas no se abren y cierran como corresponde, pueden causar insuficiencia cardaca. Esto hace que el msculo cardaco tenga que bombear con ms intensidad  para que la [place].  Enfermedad del msculo cardaco (miocardiopata o miocarditis). En esta enfermedad, el msculo cardaco est daado por diversas causas. Entre ellas se encuentran el consumo de alcohol o drogas, las infecciones o pueden ser causas desconocidas. Todas estas causas aumentan el riesgo de insuficiencia cardaca.  Enfermedad pulmonar. Enfermedad pulmonar que hace que el corazn se esfuerce ms porque los pulmones no funcionan correctamente. Esto puede hacer que el corazn se tensione, y causar insuficiencia.  Diabetes. La diabetes aumenta el riesgo de insuficiencia cardaca. El nivel elevado de azcar en la sangre contribuye a aumentar los [place] de grasas (lpidos) en la [place]. La diabetes tambin favorece que lentamente se daen los pequeos vasos sanguneos que transportan nutrientes importantes al el msculo cardaco. Cuando el corazn no obtiene oxgeno y alimento suficiente, se debilita y se torna rgido. Por lo tanto no se contrae de Set designer.  Hay otras enfermedades y condiciones que pueden contribuir a la insuficiencia cardaca. Entre ellas se incluyen el ritmo cardaco anormal, los problemas de tiroides y los recuentos bajos de glbulos rojos (anemia). Determinadas conductas perjudiciales aumentan el riesgo de insuficiencia cardaca, por ejemplo:  Tener sobrepeso.  Fumar o mascar tabaco.  Consumir alimentos ricos en grasas y colesterol.  Abusar de las drogas ilcitas o del alcohol.  La falta de actividad fsica. SNTOMAS  Los sntomas pueden variar y ser difciles de Hydrographic surveyor. Los sntomas pueden incluir:  Falta de aire al realizar actividades como subir escaleras.  Tos persistente.  Hinchazn de los pies, tobillos, piernas o abdomen.  Aumento de peso sin motivo.  Dificultad para respirar al estar acostado (ortopnea).  Despertarse con la necesidad de sentarse y tomar aire.  Latidos cardacos rpidos.  Fatiga y prdida de  Teacher, early years/pre.  Mareos, o sensacin de desmayo o desvanecimiento.  Prdida del apetito.  Nuseas.  Orina con ms frecuencia durante la noche (nocturia). DIAGNSTICO  El diagnstico se realiza segn la historia clnica, los sntomas, el examen fsico y las pruebas diagnsticas. Las pruebas diagnsticas son:  Lawyer.  Electrocardiograma.  Radiografa de trax.  Anlisis de [place].  Prueba de esfuerzo.  Angiografa cardaca.  Gammagrafa. TRATAMIENTO  El tratamiento est destinado al control de los sntomas. Podr ser necesario que tome medicamentos, realice cambios en su estilo de vida o se someta a una intervencin [place] para tratar la insuficiencia cardaca.  Los medicamentos para el tratamiento de la insuficiencia cardaca son:  Inhibidores de la enzima convertidora de angiotensina (IECA). Este tipo de [employer] bloquea los efectos de una protena llamada enzima convertidora de angiotensina. Los inhibidores de la ECA Engineer, agricultural (dilatan) los vasos sanguneos y ayudan a Equities trader la presin arterial.  Bloqueadores de los receptores de angiotensina [place]). Este tipo de medicamento bloquea las acciones de una protena de la sangre llamada angiotensina. Los bloqueadores de los receptores de angiotensina dilatan los vasos sanguneos y [place] a reducir la presin arterial.  Medicamentos para eliminar los lquidos (diurticos). Los diurticos [employer] los riones eliminen la sal y el agua de la [place]. El lquido en exceso es eliminado con la [place]. Esta eliminacin del exceso de lquido disminuye el volumen de sangre que el corazn bombea.  Betabloqueantes. Los betabloqueantes evitan que el corazn palpite demasiado rpido y mejoran la fuerza del msculo cardaco.  Digitlicos. Aumentan la fuerza de los latidos cardacos.  Los [employer] Energy un estilo de vida saludable incluyen:  Science writer y Theatre manager un peso saludable.  Dejar de fumar o mascar tabaco.  Consumir alimentos  cardiosaludables.  Limitar o evitar el alcohol.  Dejar de consumir drogas ilcitas.  Hacer actividad fsica segn las indicaciones de su mdico.  Los tratamientos quirrgicos para la insuficiencia cardaca pueden ser:  Un procedimiento para abrir las arterias obstruidas, reparacin de vlvulas cardacas daadas o la extirpacin del tejido muscular cardaco daado.  La colocacin de un marcapasos para ayudar al funcionamiento del msculo cardaco y para controlar ciertos ritmos cardacos anormales.  Un desfibrilador cardioversor interno para tratar determinados ritmos cardacos graves anormales.  Un dispositivo de asistencia ventricular izquierda (DAVI) para ayudar a que el corazn [employer]. [place] los medicamentos solamente como se lo haya indicado el mdico. Los medicamentos son importantes para reducir la carga de trabajo del corazn, disminuir la progresin de la insuficiencia cardaca y Teacher, English as a foreign language los sntomas.  No deje de tomar los medicamentos, excepto si se lo indica su mdico.  No se saltee ninguna dosis de los medicamentos.  Pida una nueva receta antes de quedarse sin medicamentos. Es necesario que tome sus medicamentos todos [place].  Haga actividad fsica moderada si se lo indica su mdico. La actividad fsica moderada puede beneficiar a Psychologist, clinical. Los ancianos y las personas con insuficiencia cardaca grave deben consultarle a su mdico qu actividades fsicas son recomendables para ellos.  Consuma alimentos cardiosaludables. Los alimentos no deben incluir grasas trans y deben tener bajo contenido de grasas saturadas, colesterol y sal (sodio). Las opciones saludables incluyen frutas frescas o congeladas y verduras, pescado, carnes magras, legumbres, productos lcteos descremados o bajos en grasa y cereales integrales o alimentos con alto contenido de [place]. Hable con un nutricionista para aprender ms sobre los alimentos  cardiosaludables.  Limite el  sodio si se lo indica su mdico. La restriccin de sodio puede reducir los sntomas de insuficiencia cardaca en [employer]. Hable con un nutricionista para aprender ms sobre los condimentos cardiosaludables.  Use mtodos de coccin saludables. Los mtodos de coccin saludables incluyen asar, Interior and spatial designer, hervir, Teacher, music, cocer al vapor o IT consultant. Hable con un nutricionista para aprender ms acerca de los mtodos de coccin saludables.  Limite los lquidos si se lo indica su mdico. La restriccin de lquidos puede reducir los sntomas de insuficiencia cardaca en [employer].  Controle su peso a diario. Es importante que se pese todos los das para reconocer anticipadamente cuando hay exceso de lquido. Hgalo todas las maanas despus de orinar y antes de Merchandiser, retail. Use la misma ropa cada vez que se pese. Anote su [employer]. Lleve a su mdico el registro de su peso.  Controle y registre su presin arterial si se lo indica su mdico.  Contrlese el pulso si se lo indica su mdico.  Pierda peso si se lo indica su mdico. La prdida de peso puede reducir los sntomas de insuficiencia cardaca en Psychologist, clinical.  Deje de fumar o mascar tabaco. La nicotina hace que el corazn trabaje ms y [employer] vasos sanguneos. No utilice chicles ni parches de nicotina antes de hablar con su mdico.  Concurra a todas las visitas de control como se lo haya indicado el mdico. Esto es importante.  Limite el consumo de alcohol a no ms de 1 medida por da en las mujeres no embarazadas y 2 medidas en los hombres. Una medida equivale a 12onzas de cerveza, 5onzas de vino o 1onzas de bebidas alcohlicas de alta graduacin. Beber ms puede ser daino para el corazn. Informe a su mdico si bebe alcohol varias veces a la semana. Hable con su mdico acerca de si el alcohol es seguro para usted. Si el corazn ya ha sufrido un dao por el alcohol o sufre una  insuficiencia cardaca grave, debe dejar de consumirlo completamente.  Deje de consumir drogas ilcitas.  [place]. Esto es especialmente importante prevenir las infecciones respiratorias con vacunas actualizadas contra el neumococo y la gripe.  Controle otros problemas de [place], como hipertensin, diabetes, enfermedad de la tiroides o ritmos cardacos anormales segn las indicaciones de su mdico.  Aprenda a Engineer, maintenance (IT).  Planifique perodos de descanso cuando se sienta fatigado.  Aprenda estrategias para manejar las altas temperaturas. Si el clima es extremadamente caluroso:  Evite la actividad fsica intensa.  Use el aire acondicionado o los ventiladores o busque un lugar ms fresco.  Evite la cafena y el alcohol.  Use ropa holgada, ligera y de colores claros.  Aprenda estrategias para manejar el fro. Si el clima es extremadamente fro:  Evite la actividad fsica intensa.  Vstase con varias capas de ropa.  Use mitones o guantes, un sombrero y [place] bufanda cuando salga.  Evite el alcohol.  Reciba asesoramiento y 1 si lo necesita.  Busque programas de rehabilitacin y participe en ellos para mantener o mejorar su independencia y su calidad de vida. SOLICITE ATENCIN MDICA SI:   Aumenta de peso, 3 libras (1,4 kg) en un da o 5 libras (2,3 Kg) en una semana.  Nota que le falta el aire y esto no es habitual en usted.  No puede participar en sus actividades fsicas habituales.  Se cansa con facilidad.  Tose ms de lo normal, especialmente al realizar actividad fsica.  Observa que sus manos,  pies, tobillos o abdomen se le hinchan o estn ms hinchados que lo habitual.  Le cuesta dormir debido a que Film/video editor.  Siente que el corazn late rpido (palpitaciones).  Siente mareos o sensacin de desvanecimiento al ponerse de pie. SOLICITE ATENCIN MDICA DE INMEDIATO SI:   Tiene dificultad para respirar.  Hay una  modificacin en el estado mental, como disminucin de la lucidez o dificultad para concentrarse.  Siente dolor o Adult nurse.  Se desmaya una vez (sncope). ASEGRESE DE QUE:   Comprende estas instrucciones.  Controlar su afeccin.  Recibir ayuda de inmediato si no mejora o si empeora. Document Released: 06/06/2005 Document Revised: 10/21/2013 [facility] Patient Information 2015 [place], [place]. This information is not intended to replace advice given to you by your health care provider. Make sure you discuss any questions you have with your health care provider.  Lorazepam tablets Qu es este medicamento? El LORAZEPAM es una benzodiacepina. Se utiliza para tratar la ansiedad. Este medicamento puede ser utilizado para otros usos; si tiene alguna pregunta consulte con su proveedor de atencin mdica o con su farmacutico. MARCAS COMERCIALES DISPONIBLES: Ativan Qu le debo informar a mi profesional de la salud antes de tomar este medicamento? Necesita saber si usted presenta alguno de los siguientes problemas o situaciones: -problema de alcoholismo o drogadiccin -trastorno bipolar, depresin, psicosis u otros problemas de salud mental -glaucoma -enfermedad renal o heptica -enfermedad pulmonar o dificultades respiratorias -miastenia gravis -enfermedad de Parkinson -convulsiones o antecedentes de convulsiones -ideas suicidas -una reaccin alrgica o inusual al lorazepam, a otras benzodiacepinas, alimentos, colorantes o conservantes -si est embarazada o buscando quedar embarazada -si est amamantando a un beb Cmo debo utilizar este medicamento? Tome este medicamento por va oral con un vaso de agua. Siga las instrucciones de la etiqueta del [place]. Si el Transport planner, tmelo con alimentos o con [place]. Tome sus dosis a intervalos regulares. No tome su medicamento con una frecuencia mayor a la indicada. No deje de tomarlo excepto si as  lo indica su mdico o su profesional de [employer]	[place]. Hable con su pediatra para informarse acerca del uso de este medicamento en nios. Puede requerir atencin especial. Sobredosis: Pngase en contacto inmediatamente con un centro toxicolgico o una sala de urgencia si usted cree que haya tomado demasiado medicamento. ATENCIN: [employer] es solo para usted. No comparta este medicamento con nadie. Qu sucede si me olvido de una dosis? Si olvida una dosis, tmela lo antes posible. Si es casi la hora de la prxima dosis, tome slo esa dosis. No tome dosis adicionales o dobles. Qu puede interactuar con este medicamento? -barbitricos para inducir el sueo o para el tratamiento de convulsiones, tal como el fenobarbital -clozapina -medicamentos para la depresin, problemas mentales o trastornos psiquitricos -medicamentos para dormir -fenitona -probenecid -teofilina -cido valproico Puede ser que esta lista no menciona todas las posibles interacciones. Informe a su profesional de [employer]	[place] de [employer] Corporation productos a base de hierbas, medicamentos de [place] o suplementos nutritivos que est tomando. Si usted fuma, consume bebidas alcohlicas o si utiliza drogas ilegales, indqueselo tambin a su profesional de [employer]	[place]. Algunas sustancias pueden interactuar con su medicamento. A qu debo estar atento al usar [employer]? Visite a su mdico o a su profesional de la salud para chequear su evolucin peridicamente. Su cuerpo puede hacerse dependiente del medicamento, por esta razn, pregunte a su mdico o a su profesional de la salud si todava necesita tomarlo. Sin  embargo, si ha venido tomando este medicamento de [employer] regular durante algn tiempo, no deje de tomarlo repentinamente. Debe reducir gradualmente la dosis para no sufrir efectos secundarios severos. Consulte a su mdico o a su profesional de la salud antes de aumentar o reducir la dosis. Aun despus de dejar de tomarlo, los  efectos del medicamento en su cuerpo pueden perdurar [employer]. Puede experimentar somnolencia o mareos. No conduzca ni utilice maquinaria, ni haga nada que Associate Professor en estado de alerta hasta que sepa cmo le afecta este medicamento. Para reducir el riesgo de mareos o [place], no se ponga de pie ni se siente con rapidez, especialmente si es un paciente de edad avanzada. El alcohol puede aumentar su somnolencia y [place]. Evite consumir bebidas alcohlicas. No se trate usted mismo si tiene tos, resfro o Set designer sin Teacher, adult education a su mdico o a su profesional de Technical sales engineer. Algunos ingredientes pueden aumentar los posibles efectos secundarios. Qu efectos secundarios puedo tener al [employer] este medicamento? Efectos secundarios que debe informar a su mdico o a Barrister's clerk de la salud tan pronto como sea posible: -cambios en la visin -confusin -depresin -cambios de humor, excitabilidad o comportamiento agresivo -dificultades con los movimientos, sensacin de vrtigo o movimientos entrecortados -calambres musculares -inquietud -cansancio o debilidad Efectos secundarios que, por lo general, no requieren atencin mdica (debe informarlos a su mdico o a su profesional de la salud si persisten o si son molestos): -estreimiento o diarrea -dificultad para conciliar el sueo, pesadillas -mareos o somnolencia -dolor de cabeza -nuseas, vmito Puede ser que [employer] no menciona todos los posibles efectos secundarios. Comunquese a su mdico por asesoramiento mdico [employer] Inc. Usted puede informar los efectos secundarios a la FDA por telfono al 1-800-FDA-1088. Dnde debo guardar mi medicina? Mantngala fuera del alcance de los nios. Este medicamento puede ser abusado. Mantenga su medicamento en un lugar seguro para protegerlo contra robos. No comparta este medicamento con nadie. Es peligroso vender o Associate Professor y est prohibido por la  ley. Gurdela a [employer], entre 20 y 29 grados C (31 y 53 grados F). Protjala de la luz. Mantenga el envase bien cerrado. Deseche todo el medicamento que no haya utilizado, despus de la fecha de vencimiento. ATENCIN: Este folleto es un resumen. Puede ser que no cubra toda la posible informacin. Si usted tiene preguntas acerca de esta medicina, consulte con su mdico, su farmacutico o su profesional de Technical sales engineer.  2015, [employer]vier/[employer] Standard. (2007-03-30 11:37:00)

## 2014-09-15 NOTE — ED Notes (Signed)
C/o pain to center of chest x 1 hour.  States pain woke him up.  Also c/o sob.  Denies nausea and vomiting.

## 2014-09-15 NOTE — ED Notes (Signed)
Pt verbalized understanding of d/c instructions and has no further questions per the interpreter phone.

## 2014-09-16 ENCOUNTER — Emergency Department (HOSPITAL_COMMUNITY): Payer: Medicare Other

## 2014-09-16 ENCOUNTER — Inpatient Hospital Stay (HOSPITAL_COMMUNITY)
Admission: EM | Admit: 2014-09-16 | Discharge: 2014-09-18 | DRG: 291 | Disposition: A | Discharge status: Home / Self Care | Payer: Medicare Other | Attending: Internal Medicine | Admitting: Internal Medicine

## 2014-09-16 ENCOUNTER — Encounter (HOSPITAL_COMMUNITY): Payer: Self-pay | Admitting: Emergency Medicine

## 2014-09-16 DIAGNOSIS — I429 Cardiomyopathy, unspecified: Secondary | ICD-10-CM

## 2014-09-16 DIAGNOSIS — I2510 Atherosclerotic heart disease of native coronary artery without angina pectoris: Secondary | ICD-10-CM | POA: Diagnosis present

## 2014-09-16 DIAGNOSIS — I272 Other secondary pulmonary hypertension: Secondary | ICD-10-CM | POA: Diagnosis present

## 2014-09-16 DIAGNOSIS — I5023 Acute on chronic systolic (congestive) heart failure: Secondary | ICD-10-CM | POA: Diagnosis present

## 2014-09-16 DIAGNOSIS — N183 Chronic kidney disease, stage 3 unspecified: Secondary | ICD-10-CM | POA: Diagnosis present

## 2014-09-16 DIAGNOSIS — Z95810 Presence of automatic (implantable) cardiac defibrillator: Secondary | ICD-10-CM

## 2014-09-16 DIAGNOSIS — J9620 Acute and chronic respiratory failure, unspecified whether with hypoxia or hypercapnia: Secondary | ICD-10-CM | POA: Diagnosis present

## 2014-09-16 DIAGNOSIS — I480 Paroxysmal atrial fibrillation: Secondary | ICD-10-CM | POA: Diagnosis present

## 2014-09-16 DIAGNOSIS — Z8673 Personal history of transient ischemic attack (TIA), and cerebral infarction without residual deficits: Secondary | ICD-10-CM | POA: Diagnosis not present

## 2014-09-16 DIAGNOSIS — Z7901 Long term (current) use of anticoagulants: Secondary | ICD-10-CM

## 2014-09-16 DIAGNOSIS — N189 Chronic kidney disease, unspecified: Secondary | ICD-10-CM | POA: Diagnosis not present

## 2014-09-16 DIAGNOSIS — I2721 Secondary pulmonary arterial hypertension: Secondary | ICD-10-CM | POA: Diagnosis present

## 2014-09-16 DIAGNOSIS — I428 Other cardiomyopathies: Secondary | ICD-10-CM

## 2014-09-16 DIAGNOSIS — I252 Old myocardial infarction: Secondary | ICD-10-CM

## 2014-09-16 DIAGNOSIS — I69398 Other sequelae of cerebral infarction: Secondary | ICD-10-CM | POA: Diagnosis not present

## 2014-09-16 DIAGNOSIS — Z66 Do not resuscitate: Secondary | ICD-10-CM | POA: Diagnosis present

## 2014-09-16 DIAGNOSIS — I129 Hypertensive chronic kidney disease with stage 1 through stage 4 chronic kidney disease, or unspecified chronic kidney disease: Secondary | ICD-10-CM | POA: Diagnosis present

## 2014-09-16 DIAGNOSIS — R1900 Intra-abdominal and pelvic swelling, mass and lump, unspecified site: Secondary | ICD-10-CM | POA: Diagnosis present

## 2014-09-16 DIAGNOSIS — E875 Hyperkalemia: Secondary | ICD-10-CM | POA: Diagnosis present

## 2014-09-16 DIAGNOSIS — N179 Acute kidney failure, unspecified: Secondary | ICD-10-CM | POA: Diagnosis present

## 2014-09-16 DIAGNOSIS — G40909 Epilepsy, unspecified, not intractable, without status epilepticus: Secondary | ICD-10-CM | POA: Diagnosis present

## 2014-09-16 DIAGNOSIS — E785 Hyperlipidemia, unspecified: Secondary | ICD-10-CM | POA: Diagnosis present

## 2014-09-16 DIAGNOSIS — I509 Heart failure, unspecified: Secondary | ICD-10-CM

## 2014-09-16 DIAGNOSIS — E871 Hypo-osmolality and hyponatremia: Secondary | ICD-10-CM | POA: Diagnosis present

## 2014-09-16 DIAGNOSIS — I447 Left bundle-branch block, unspecified: Secondary | ICD-10-CM | POA: Diagnosis present

## 2014-09-16 DIAGNOSIS — R32 Unspecified urinary incontinence: Secondary | ICD-10-CM | POA: Diagnosis present

## 2014-09-16 DIAGNOSIS — R0600 Dyspnea, unspecified: Secondary | ICD-10-CM | POA: Diagnosis present

## 2014-09-16 DIAGNOSIS — I10 Essential (primary) hypertension: Secondary | ICD-10-CM | POA: Diagnosis present

## 2014-09-16 DIAGNOSIS — Z888 Allergy status to other drugs, medicaments and biological substances status: Secondary | ICD-10-CM

## 2014-09-16 DIAGNOSIS — N184 Chronic kidney disease, stage 4 (severe): Secondary | ICD-10-CM | POA: Diagnosis present

## 2014-09-16 DIAGNOSIS — I5022 Chronic systolic (congestive) heart failure: Secondary | ICD-10-CM

## 2014-09-16 DIAGNOSIS — Z950 Presence of cardiac pacemaker: Secondary | ICD-10-CM

## 2014-09-16 LAB — CBC WITH DIFFERENTIAL/PLATELET
BASOS PCT: 0 % (ref 0–1)
Basophils Absolute: 0 10*3/uL (ref 0.0–0.1)
EOS ABS: 0.1 10*3/uL (ref 0.0–0.7)
Eosinophils Relative: 1 % (ref 0–5)
HCT: 32.8 % — ABNORMAL LOW (ref 39.0–52.0)
HEMOGLOBIN: 10.6 g/dL — AB (ref 13.0–17.0)
LYMPHS PCT: 6 % — AB (ref 12–46)
Lymphs Abs: 0.8 10*3/uL (ref 0.7–4.0)
MCH: 25.8 pg — ABNORMAL LOW (ref 26.0–34.0)
MCHC: 32.3 g/dL (ref 30.0–36.0)
MCV: 79.8 fL (ref 78.0–100.0)
MONO ABS: 1.2 10*3/uL — AB (ref 0.1–1.0)
Monocytes Relative: 8 % (ref 3–12)
NEUTROS ABS: 12.3 10*3/uL — AB (ref 1.7–7.7)
NEUTROS PCT: 85 % — AB (ref 43–77)
PLATELETS: 375 10*3/uL (ref 150–400)
RBC: 4.11 MIL/uL — ABNORMAL LOW (ref 4.22–5.81)
RDW: 18.6 % — ABNORMAL HIGH (ref 11.5–15.5)
WBC: 14.4 10*3/uL — AB (ref 4.0–10.5)

## 2014-09-16 LAB — I-STAT TROPONIN, ED: Troponin i, poc: 0.02 ng/mL (ref 0.00–0.08)

## 2014-09-16 LAB — COMPREHENSIVE METABOLIC PANEL
ALK PHOS: 223 U/L — AB (ref 39–117)
ALT: 42 U/L (ref 0–53)
AST: 35 U/L (ref 0–37)
Albumin: 3.4 g/dL — ABNORMAL LOW (ref 3.5–5.2)
Anion gap: 15 (ref 5–15)
BUN: 79 mg/dL — AB (ref 6–23)
CHLORIDE: 89 mmol/L — AB (ref 96–112)
CO2: 21 mmol/L (ref 19–32)
Calcium: 9.2 mg/dL (ref 8.4–10.5)
Creatinine, Ser: 2.2 mg/dL — ABNORMAL HIGH (ref 0.50–1.35)
GFR, EST AFRICAN AMERICAN: 31 mL/min — AB (ref >90)
GFR, EST NON AFRICAN AMERICAN: 27 mL/min — AB (ref >90)
Glucose, Bld: 183 mg/dL — ABNORMAL HIGH (ref 70–99)
POTASSIUM: 5.2 mmol/L — AB (ref 3.5–5.1)
SODIUM: 125 mmol/L — AB (ref 135–145)
TOTAL PROTEIN: 7.2 g/dL (ref 6.0–8.3)
Total Bilirubin: 0.9 mg/dL (ref 0.3–1.2)

## 2014-09-16 LAB — BRAIN NATRIURETIC PEPTIDE: B NATRIURETIC PEPTIDE 5: 3495.2 pg/mL — AB (ref 0.0–100.0)

## 2014-09-16 LAB — PROTIME-INR
INR: 2.59 — ABNORMAL HIGH (ref 0.00–1.49)
PROTHROMBIN TIME: 28 s — AB (ref 11.6–15.2)

## 2014-09-16 MED ORDER — POLYETHYLENE GLYCOL 3350 17 G PO PACK
17.0000 g | PACK | Freq: Every day | ORAL | Status: DC
  Administered 2014-09-16 – 2014-09-17 (×2): 17 g via ORAL
  Filled 2014-09-16 (×3): qty 1

## 2014-09-16 MED ORDER — ACETAMINOPHEN 325 MG PO TABS: 650.0000 mg | ORAL_TABLET | ORAL | Status: DC | PRN

## 2014-09-16 MED ORDER — MILRINONE IN DEXTROSE 20 MG/100ML IV SOLN
0.5000 ug/kg/min | INTRAVENOUS | Status: DC
  Administered 2014-09-16 – 2014-09-18 (×4): 0.5 ug/kg/min via INTRAVENOUS
  Filled 2014-09-16 (×6): qty 100

## 2014-09-16 MED ORDER — METOLAZONE 5 MG PO TABS
5.0000 mg | ORAL_TABLET | Freq: Every day | ORAL | Status: DC
  Administered 2014-09-16 – 2014-09-17 (×2): 5 mg via ORAL
  Filled 2014-09-16 (×2): qty 1

## 2014-09-16 MED ORDER — SODIUM CHLORIDE 0.9 % IJ SOLN
3.0000 mL | Freq: Two times a day (BID) | INTRAMUSCULAR | Status: DC
  Administered 2014-09-16 – 2014-09-18 (×5): 3 mL via INTRAVENOUS

## 2014-09-16 MED ORDER — TRAMADOL HCL 50 MG PO TABS: 50.0000 mg | ORAL_TABLET | Freq: Four times a day (QID) | ORAL | Status: DC | PRN

## 2014-09-16 MED ORDER — FUROSEMIDE 10 MG/ML IJ SOLN
80.0000 mg | Freq: Two times a day (BID) | INTRAMUSCULAR | Status: DC
  Administered 2014-09-17: 80 mg via INTRAVENOUS
  Filled 2014-09-16 (×4): qty 8

## 2014-09-16 MED ORDER — WARFARIN SODIUM 4 MG PO TABS
4.0000 mg | ORAL_TABLET | Freq: Every day | ORAL | Status: DC
  Administered 2014-09-16: 4 mg via ORAL
  Filled 2014-09-16: qty 1

## 2014-09-16 MED ORDER — WARFARIN - PHARMACIST DOSING INPATIENT: Freq: Every day | Status: DC

## 2014-09-16 MED ORDER — MILRINONE IN DEXTROSE 20 MG/100ML IV SOLN: 0.5000 ug/kg/min | INTRAVENOUS | Status: DC

## 2014-09-16 MED ORDER — WARFARIN SODIUM 4 MG PO TABS: 4.0000 mg | ORAL_TABLET | ORAL | Status: DC

## 2014-09-16 MED ORDER — SORBITOL 70 % SOLN
30.0000 mL | Freq: Every day | Status: DC
  Administered 2014-09-16 – 2014-09-18 (×3): 30 mL via ORAL
  Filled 2014-09-16 (×3): qty 30

## 2014-09-16 MED ORDER — AMIODARONE HCL 200 MG PO TABS
200.0000 mg | ORAL_TABLET | Freq: Every day | ORAL | Status: DC
  Administered 2014-09-16 – 2014-09-18 (×3): 200 mg via ORAL
  Filled 2014-09-16 (×3): qty 1

## 2014-09-16 MED ORDER — LORAZEPAM 1 MG PO TABS: 1.0000 mg | ORAL_TABLET | Freq: Three times a day (TID) | ORAL | Status: DC | PRN

## 2014-09-16 MED ORDER — SODIUM CHLORIDE 0.9 % IV SOLN
250.0000 mL | INTRAVENOUS | Status: DC | PRN
  Administered 2014-09-18: 250 mL via INTRAVENOUS

## 2014-09-16 MED ORDER — ACETAMINOPHEN 325 MG PO TABS: 650.0000 mg | ORAL_TABLET | Freq: Four times a day (QID) | ORAL | Status: DC | PRN

## 2014-09-16 MED ORDER — METOCLOPRAMIDE HCL 10 MG PO TABS
10.0000 mg | ORAL_TABLET | Freq: Two times a day (BID) | ORAL | Status: DC
  Administered 2014-09-16 – 2014-09-18 (×5): 10 mg via ORAL
  Filled 2014-09-16 (×5): qty 1

## 2014-09-16 MED ORDER — FUROSEMIDE 10 MG/ML IJ SOLN
40.0000 mg | Freq: Once | INTRAMUSCULAR | Status: AC
  Administered 2014-09-16: 40 mg via INTRAVENOUS
  Filled 2014-09-16: qty 4

## 2014-09-16 MED ORDER — SILDENAFIL CITRATE 20 MG PO TABS
20.0000 mg | ORAL_TABLET | Freq: Three times a day (TID) | ORAL | Status: DC
  Administered 2014-09-16 – 2014-09-18 (×8): 20 mg via ORAL
  Filled 2014-09-16 (×8): qty 1

## 2014-09-16 MED ORDER — ONDANSETRON HCL 4 MG/2ML IJ SOLN: 4.0000 mg | Freq: Four times a day (QID) | INTRAMUSCULAR | Status: DC | PRN

## 2014-09-16 MED ORDER — SODIUM CHLORIDE 0.9 % IJ SOLN: 3.0000 mL | INTRAMUSCULAR | Status: DC | PRN

## 2014-09-16 MED ORDER — FUROSEMIDE 10 MG/ML IJ SOLN
80.0000 mg | Freq: Once | INTRAMUSCULAR | Status: AC
  Administered 2014-09-17: 80 mg via INTRAVENOUS

## 2014-09-16 MED ORDER — WARFARIN SODIUM 2 MG PO TABS
2.0000 mg | ORAL_TABLET | ORAL | Status: DC
  Filled 2014-09-16: qty 1

## 2014-09-16 NOTE — ED Notes (Signed)
Pt moved to hospital bed.

## 2014-09-16 NOTE — ED Provider Notes (Signed)
CSN: [id number]     Arrival date & time 09/16/14  5621 History  This chart was scribed for Veryl Speak, MD by Randa Evens, ED Scribe. This patient was seen in room A02C/A02C and the patient's care was started at 2:56 AM.    Chief Complaint  Patient presents with  . Shortness of Breath   Patient is a 81 y.o. male presenting with shortness of breath. The history is provided by the patient. A language interpreter was used.  Shortness of Breath Severity:  Moderate Onset quality:  Gradual Duration:  2 days Chronicity:  Recurrent Ineffective treatments:  None tried Associated symptoms: no chest pain    HPI Comments: Tyler Christensen is a 81 y.o. male with PMHx listed below who presents to the Emergency Department complaining of recurrent episode of SOB. Pt reports leg swelling. Pt states that he is seen about 5x per month for an shortness of breath exacerbation. Pt doesn't report any medications PTA. Pt states that he is on continuous milrinone drip.     Cardiologist Dr. Haroldine Laws   Past Medical History  Diagnosis Date  . HTN (hypertension)   . High cholesterol   . CHF (congestive heart failure)   . BBBB (bilateral bundle branch block)   . LBBB (left bundle branch block)   . Seizures 06/11/2012    new onset/notes (06/11/2012)  . SOB (shortness of breath)     "sometimes when I lay down;; related to not taking my RX" (06/11/2012)  . Myocardial infarction     06/10/2012  . Atrial fibrillation   . Stroke   . Atrial thrombus     left  . Pulmonary HTN   . Atrial fibrillation 06/12/2012  . Coronary artery disease    Past Surgical History  Procedure Laterality Date  . Throat surgery  1942    "and nose" (06/11/2012); ?T&A  . Inguinal hernia repair  ~ 2008    "both sides" (06/11/2012)  . Tee without cardioversion  06/29/2012    Procedure: TRANSESOPHAGEAL ECHOCARDIOGRAM (TEE);  Surgeon: Jolaine Artist, MD;  Location: [facility] ENDOSCOPY;   . Cardioversion N/A 08/14/2012    Procedure:  CARDIOVERSION;  Surgeon: Jolaine Artist, MD;  Location: [facility] ENDOSCOPY;  . Tee without cardioversion N/A 11/12/2013    Procedure: TRANSESOPHAGEAL ECHOCARDIOGRAM (TEE);  Surgeon: Candee Furbish, MD;  Location: [facility] ENDOSCOPY;   . Tee without cardioversion N/A 11/19/2013    Procedure: TRANSESOPHAGEAL ECHOCARDIOGRAM (TEE);  Surgeon: Jolaine Artist, MD;  Location: [facility] ENDOSCOPY;   . Cardioversion N/A 12/27/2013    Procedure: CARDIOVERSION;  Surgeon: Jolaine Artist, MD;  Location: [facility] ENDOSCOPY;   . Left and right heart catheterization with coronary angiogram N/A 07/02/2012    Procedure: LEFT AND RIGHT HEART CATHETERIZATION WITH CORONARY ANGIOGRAM;  Surgeon: Jolaine Artist, MD;  Location: [facility] CATH LAB; Mild non-obstructive CAD, severe PAH, PA = 93/42 (59), severe NICM w/ EF 15%  . Right heart catheterization N/A 07/06/2012    Procedure: RIGHT HEART CATH;  Surgeon: Jolaine Artist, MD; to assess response to milrinone; Moderate PH with profound reduction in PVR with milrinone/PDE-5 inhibitor   . Bi-ventricular implantable cardioverter defibrillator N/A 10/26/2012    Procedure: BI-VENTRICULAR IMPLANTABLE CARDIOVERTER DEFIBRILLATOR  (CRT-D);  Surgeon: Evans Lance, MD;  Medtronic Evera XT CRTD, BiV ICD serial #[id number]     Family History  Problem Relation Age of Onset  . Heart attack      father had MI in his 81s, no fhx of early heart problem  before age 31  . Heart attack      mother died of MI around 09/27/1998   History  Substance Use Topics  . Smoking status: Never Smoker   . Smokeless tobacco: Never Used  . Alcohol Use: No    Review of Systems  Respiratory: Positive for shortness of breath.   Cardiovascular: Positive for leg swelling. Negative for chest pain.  All other systems reviewed and are negative.   Allergies  Lisinopril  Home Medications   Prior to Admission medications   Medication Sig Start Date End Date Taking? Authorizing Provider  acetaminophen (TYLENOL) 325 MG tablet  Take 2 tablets (650 mg total) by mouth every 6 (six) hours as needed for mild pain (or Fever >/= 101). 07/10/14   Kinnie Feil, MD  amiodarone (PACERONE) 200 MG tablet Take 1 tablet (200 mg total) by mouth daily. 07/10/14   Kinnie Feil, MD  LORazepam (ATIVAN) 1 MG tablet Take 1 tablet (1 mg total) by mouth 3 (three) times daily as needed for anxiety. 0/32/12   Delora Fuel, MD  magnesium oxide (MAG-OX) 400 MG tablet Take 1 tablet (400 mg total) by mouth 2 (two) times daily. 04/25/14   Jolaine Artist, MD  metoCLOPramide (REGLAN) 10 MG tablet Take 1 tablet (10 mg total) by mouth 2 (two) times daily. 07/31/14   Jolaine Artist, MD  metolazone (ZAROXOLYN) 5 MG tablet Take 5 mg every Mon-Wed-Fri 09/12/14   [name] D Clegg, NP  milrinone (PRIMACOR) 20 MG/100ML SOLN infusion Inject 33.95 mcg/min into the vein continuous. Patient not taking: Reported on 09/15/2014 06/10/14   Rande Brunt, NP  polyethylene glycol [facility] / GLYCOLAX) packet Take 17 g by mouth daily. 08/23/14   Isaiah Serge, NP  potassium chloride SA (K-DUR,KLOR-CON) 20 MEQ tablet Take 3 tablets (60 mEq total) by mouth 2 (two) times daily. 09/12/14   [name] D Ninfa Meeker, NP  sildenafil (REVATIO) 20 MG tablet Take 1 tablet (20 mg total) by mouth 3 (three) times daily. Patient not taking: Reported on 09/15/2014 03/07/14   Rande Brunt, NP  sorbitol 70 % SOLN Take 30 mLs by mouth daily. Patient not taking: Reported on 09/15/2014 07/17/14   Jolaine Artist, MD  spironolactone (ALDACTONE) 25 MG tablet Take 2 tablets (50 mg total) by mouth 2 (two) times daily. 09/12/14   [name] D Ninfa Meeker, NP  torsemide (DEMADEX) 20 MG tablet Take 4 tablets (80 mg total) by mouth 2 (two) times daily. 09/12/14   [name] D Ninfa Meeker, NP  traMADol (ULTRAM) 50 MG tablet Take 1 tablet (50 mg total) by mouth 2 (two) times daily as needed. for pain Patient not taking: Reported on 09/15/2014 09/12/14   Conrad Trail, NP  warfarin (COUMADIN) 4 MG tablet Take 4 mg Monday, Wednesday, Friday and  September 27, 2022. 2 mg on Tues, Thurs and Sat Patient taking differently: Take 4 mg by mouth daily at 6 PM. Take 4 mg Monday, Wednesday, Friday and 09-27-2022. 2 mg on Tues, Thurs and Sat 06/10/14   Rande Brunt, NP   There were no vitals taken for this visit.   Physical Exam  Constitutional: He is oriented to person, place, and time. He appears well-developed and well-nourished. No distress.  HENT:  Head: Normocephalic and atraumatic.  Eyes: Conjunctivae and EOM are normal.  Neck: Neck supple. No tracheal deviation present.  Cardiovascular: Normal rate.   Pulmonary/Chest: Effort normal. No respiratory distress. He has rales.  Rales in bilateral lower bases.  Musculoskeletal: Normal range of motion. He exhibits edema.  2+ pitting edema in lower extremities.   Neurological: He is alert and oriented to person, place, and time.  Skin: Skin is warm and dry.  Psychiatric: He has a normal mood and affect. His behavior is normal.  Nursing note and vitals reviewed.   ED Course  Procedures (including critical care time) DIAGNOSTIC STUDIES: Oxygen Saturation is 99% on RA, normal by my interpretation.    COORDINATION OF CARE: 2:56 AM-Discussed treatment plan with pt at bedside and pt agreed to plan.     Labs Review Labs Reviewed - No data to display  Imaging Review Dg Chest 2 View  09/15/2014   CLINICAL DATA:  Increasing shortness of breath.  Chest pain.  EXAM: CHEST  2 VIEW  COMPARISON:  08/31/2014  FINDINGS: Multi lead left-sided pacemaker remains in place. Tip of the right central line in the atrial caval junction. Cardiomegaly is unchanged. Pulmonary edema is unchanged. Trace bilateral pleural effusions. No confluent airspace disease. No pneumothorax.  IMPRESSION: Cardiomegaly and pulmonary edema, unchanged from prior exam. There are trace bilateral pleural effusions.   Electronically Signed   By: Jeb Levering M.D.   On: 09/15/2014 02:30    ED ECG REPORT   Date: 09/16/2014  Rate: 70   Rhythm: Ventricular paced  QRS Axis: indeterminate  Intervals: normal  ST/T Wave abnormalities: indeterminate  Conduction Disutrbances:none  Narrative Interpretation:   Old EKG Reviewed: unchanged  I have personally reviewed the EKG tracing and agree with the computerized printout as noted.   MDM   Final diagnoses:  None     Patient with history of advanced cardiomyopathy and multiple admissions. He is on a milrinone drip at home. He presents today for evaluation of shortness of breath that appears to be related to an exacerbation of CHF. His BNP is elevated and his chest x-ray reveals increased vascular markings. He was given IV Lasix. I've spoken with Dr. Tommi Rumps from cardiology who has evaluated the patient and will admit him to the cardiology service.   I personally performed the services described in this documentation, which was scribed in my presence. The recorded information has been reviewed and is accurate.       Veryl Speak, MD 09/16/14 [phone]

## 2014-09-16 NOTE — H&P (Signed)
Patient ID: Tyler Christensen MRN: [id number], DOB/AGE: Jun 02, 1933   Admit date: 09/16/2014   Primary Physician: Harvie Junior, MD Primary Cardiologist: Dr. Haroldine Laws  Pt. Profile:  Tyler Christensen is a 81 y.o. male [name] (non-english speaking-spanish speaking) w/ PMHx significant for chronic systolic CHF (EF 76%), NICM, LBBB, PAF (on coumadin), HTN, HLD, CVA, Pulmonary Hypertension and h/o seizure d/o (2/2 CVA 05/2012). Has had numerous admissions recently for decompensated HF (last 3/13-3/25) despite high-dose milrinone at 0.58mcg/kg/min. He was seen the the ER yesterday and discharged. He presents again today with worsened dyspnea.  Problem List  Past Medical History  Diagnosis Date  . HTN (hypertension)   . High cholesterol   . CHF (congestive heart failure)   . BBBB (bilateral bundle branch block)   . LBBB (left bundle branch block)   . Seizures 06/11/2012    new onset/notes (06/11/2012)  . SOB (shortness of breath)     "sometimes when I lay down;; related to not taking my RX" (06/11/2012)  . Myocardial infarction     06/10/2012  . Atrial fibrillation   . Stroke   . Atrial thrombus     left  . Pulmonary HTN   . Atrial fibrillation 06/12/2012  . Coronary artery disease     Past Surgical History  Procedure Laterality Date  . Throat surgery  1942    "and nose" (06/11/2012); ?T&A  . Inguinal hernia repair  ~ 2008    "both sides" (06/11/2012)  . Tee without cardioversion  06/29/2012    Procedure: TRANSESOPHAGEAL ECHOCARDIOGRAM (TEE);  Surgeon: Jolaine Artist, MD;  Location: [facility] ENDOSCOPY;   . Cardioversion N/A 08/14/2012    Procedure: CARDIOVERSION;  Surgeon: Jolaine Artist, MD;  Location: [facility] ENDOSCOPY;  . Tee without cardioversion N/A 11/12/2013    Procedure: TRANSESOPHAGEAL ECHOCARDIOGRAM (TEE);  Surgeon: Candee Furbish, MD;  Location: [facility] ENDOSCOPY;   . Tee without cardioversion N/A 11/19/2013    Procedure: TRANSESOPHAGEAL ECHOCARDIOGRAM (TEE);  Surgeon: Jolaine Artist, MD;  Location: [facility] ENDOSCOPY;   . Cardioversion N/A 12/27/2013    Procedure: CARDIOVERSION;  Surgeon: Jolaine Artist, MD;  Location: [facility] ENDOSCOPY;   . Left and right heart catheterization with coronary angiogram N/A 07/02/2012    Procedure: LEFT AND RIGHT HEART CATHETERIZATION WITH CORONARY ANGIOGRAM;  Surgeon: Jolaine Artist, MD;  Location: [facility] CATH LAB; Mild non-obstructive CAD, severe PAH, PA = 93/42 (59), severe NICM w/ EF 15%  . Right heart catheterization N/A 07/06/2012    Procedure: RIGHT HEART CATH;  Surgeon: Jolaine Artist, MD; to assess response to milrinone; Moderate PH with profound reduction in PVR with milrinone/PDE-5 inhibitor   . Bi-ventricular implantable cardioverter defibrillator N/A 10/26/2012    Procedure: BI-VENTRICULAR IMPLANTABLE CARDIOVERTER DEFIBRILLATOR  (CRT-D);  Surgeon: Evans Lance, MD;  Medtronic Evera XT CRTD, BiV ICD serial #[id number]       Allergies  Allergies  Allergen Reactions  . Lisinopril Rash    HPI  Tyler Christensen is a 81 y.o. male [name] (non-english speaking-spanish speaking) w/ PMHx significant for chronic systolic CHF (EF 24%), NICM, LBBB, PAF (on coumadin), HTN, HLD, CVA, Pulmonary Hypertension and h/o seizure d/o (2/2 CVA 05/2012). Has had numerous admissions recently for decompensated HF (last 3/13-3/25) despite high-dose milrinone at 0.37mcg/kg/min. He was seen the the ER yesterday and discharged. He presents again today with worsened dyspnea.   He reports worsened dyspnea for the past 2 days. Denies worsened abdominal distention or LE edema. No cough, CP, chest  pressure, fevers, chills, sweats. No palpitations. No N/V/D. No GI/GU bleeding. ROS otherwise remarkable for 6 mos of poor appetite. He has been residing at a SNF since discharge. He states they have been taking good care of him there.    On arrival to the ER, he was hemodynamically stable. HR 82, BP 100/60, 96% on RA. ECG AS BiVP. CXR demonstrated vascular  congestion and cardiomegaly. Mild bibasilar airspace opacities appear mildly worsened, raising concern for mildly worsened interstitial edema. Labs were notable for K 5.2, Cr 2.2 (GFR 27), BNP 3495, POC TnI 0.02. BNP was 2970 yesterday and 3926 the day of admission on 08/31/14. Weight on arrival was 137lbs, compared to reported discharge weight of 138lbs. He was given 40mg  IV lasix in the ER. He did put out to that dose although quantification was poor due to incontinence.   Home Medications  Prior to Admission medications   Medication Sig Start Date End Date Taking? Authorizing Provider  acetaminophen (TYLENOL) 325 MG tablet Take 2 tablets (650 mg total) by mouth every 6 (six) hours as needed for mild pain (or Fever >/= 101). 07/10/14  Yes Kinnie Feil, MD  amiodarone (PACERONE) 200 MG tablet Take 1 tablet (200 mg total) by mouth daily. 07/10/14  Yes Kinnie Feil, MD  LORazepam (ATIVAN) 1 MG tablet Take 1 tablet (1 mg total) by mouth 3 (three) times daily as needed for anxiety. 2/35/36  Yes Delora Fuel, MD  magnesium oxide (MAG-OX) 400 MG tablet Take 1 tablet (400 mg total) by mouth 2 (two) times daily. 04/25/14  Yes Jolaine Artist, MD  metoCLOPramide (REGLAN) 10 MG tablet Take 1 tablet (10 mg total) by mouth 2 (two) times daily. 07/31/14  Yes Jolaine Artist, MD  metolazone (ZAROXOLYN) 5 MG tablet Take 5 mg every Mon-Wed-Fri 09/12/14  Yes [name] D Clegg, NP  polyethylene glycol (MIRALAX / GLYCOLAX) packet Take 17 g by mouth daily. 08/23/14  Yes Isaiah Serge, NP  potassium chloride SA (K-DUR,KLOR-CON) 20 MEQ tablet Take 3 tablets (60 mEq total) by mouth 2 (two) times daily. 09/12/14  Yes [name] D Clegg, NP  spironolactone (ALDACTONE) 25 MG tablet Take 2 tablets (50 mg total) by mouth 2 (two) times daily. 09/12/14  Yes [name] D Clegg, NP  torsemide (DEMADEX) 20 MG tablet Take 4 tablets (80 mg total) by mouth 2 (two) times daily. 09/12/14  Yes [name] D Ninfa Meeker, NP  warfarin (COUMADIN) 4 MG tablet Take 4 mg  Monday, Wednesday, Friday and Sunday. 2 mg on Tues, Thurs and Sat Patient taking differently: Take 4 mg by mouth daily at 6 PM. Take 4 mg Monday, Wednesday, Friday and Sunday. 2 mg on Tues, Thurs and Sat 06/10/14  Yes Rande Brunt, NP  milrinone [facility]) 20 MG/100ML SOLN infusion Inject 33.95 mcg/min into the vein continuous. Patient not taking: Reported on 09/15/2014 06/10/14   Rande Brunt, NP  sildenafil (REVATIO) 20 MG tablet Take 1 tablet (20 mg total) by mouth 3 (three) times daily. Patient not taking: Reported on 09/15/2014 03/07/14   Rande Brunt, NP  sorbitol 70 % SOLN Take 30 mLs by mouth daily. Patient not taking: Reported on 09/15/2014 07/17/14   Jolaine Artist, MD  traMADol (ULTRAM) 50 MG tablet Take 1 tablet (50 mg total) by mouth 2 (two) times daily as needed. for pain Patient not taking: Reported on 09/15/2014 09/12/14   Conrad Fayette, NP    Family History  Family History  Problem Relation Age  of Onset  . Heart attack      father had MI in his 26s, no fhx of early heart problem before age 31  . Heart attack      mother died of MI around 92    Social History  History   Social History  . Marital Status: Divorced    Spouse Name: N/A  . Number of Children: N/A  . Years of Education: N/A   Occupational History  . Retired    Social History Main Topics  . Smoking status: Never Smoker   . Smokeless tobacco: Never Used  . Alcohol Use: No  . Drug Use: No  . Sexual Activity: No   Other Topics Concern  . Not on file   Social History Narrative   [name], Spanish only (few words of English). Lives with wife.     Review of Systems General:  No chills, fever, night sweats or weight changes.  Cardiovascular:  See HPI Dermatological: No rash, lesions/masses Respiratory: No cough. + dyspnea Urologic: No hematuria, dysuria Abdominal:   No nausea, vomiting, diarrhea, bright red blood per rectum, melena, or hematemesis Neurologic:  No visual changes, wkns,  changes in mental status. All other systems reviewed and are otherwise negative except as noted above.  Physical Exam  Blood pressure 100/53, pulse 78, resp. rate 28, SpO2 97 %.  General: Pleasant, NAD, frail Psych: Normal affect. Neuro: Alert and oriented Moves all extremities spontaneously. HEENT: Normal  Neck: Supple without bruits. JVP to earlove while seated at 45 degrees. Lungs:  Resp regular and unlabored, CTA. Heart: RRR + S3. 2/6 systolic murmur at USB Abdomen: Soft, non-tender, non-distended, BS + x 4.  Extremities: No clubbing, cyanosis. 2+ LE edema. DP/PT/Radials 2+ and equal bilaterally.  Labs  Troponin [facility] of Care Test)  Recent Labs  09/16/14 0308  TROPIPOC 0.02   No results for input(s): CKTOTAL, CKMB, TROPONINI in the last 72 hours. Lab Results  Component Value Date   WBC 14.4* 09/16/2014   HGB 10.6* 09/16/2014   HCT 32.8* 09/16/2014   MCV 79.8 09/16/2014   PLT 375 09/16/2014    Recent Labs Lab 09/16/14 0250  NA 125*  K 5.2*  CL 89*  CO2 21  BUN 79*  CREATININE 2.20*  CALCIUM 9.2  PROT 7.2  BILITOT 0.9  ALKPHOS 223*  ALT 42  AST 35  GLUCOSE 183*   Lab Results  Component Value Date   CHOL 128 06/12/2012   HDL 27* 06/12/2012   LDLCALC 78 06/12/2012   TRIG 113 06/12/2012   Lab Results  Component Value Date   DDIMER 0.59* 06/05/2014     Radiology/Studies  Dg Chest 2 View  09/16/2014   CLINICAL DATA:  Acute onset of shortness breath and leg swelling. Initial encounter.  EXAM: CHEST  2 VIEW  COMPARISON:  Chest radiograph performed 09/15/2014  FINDINGS: The lungs are well-aerated. Vascular congestion is noted. Mild bibasilar opacities appear mildly worsened, raising concern for mildly worsened interstitial edema. No definite pleural effusion or pneumothorax is seen.  The heart is enlarged. A pacemaker/AICD is noted at the left chest wall, with leads ending at the right atrium, right ventricle and coronary sinus. A right-sided catheter is  noted ending within the proximal right atrium. No acute osseous abnormalities are seen.  IMPRESSION: Vascular congestion and cardiomegaly. Mild bibasilar airspace opacities appear mildly worsened, raising concern for mildly worsened interstitial edema.   Electronically Signed   By: Garald Balding M.D.   On:  09/16/2014 03:32   Dg Chest 2 View  09/15/2014   CLINICAL DATA:  Increasing shortness of breath.  Chest pain.  EXAM: CHEST  2 VIEW  COMPARISON:  08/31/2014  FINDINGS: Multi lead left-sided pacemaker remains in place. Tip of the right central line in the atrial caval junction. Cardiomegaly is unchanged. Pulmonary edema is unchanged. Trace bilateral pleural effusions. No confluent airspace disease. No pneumothorax.  IMPRESSION: Cardiomegaly and pulmonary edema, unchanged from prior exam. There are trace bilateral pleural effusions.   Electronically Signed   By: Jeb Levering M.D.   On: 09/15/2014 02:30   Dg Chest 2 View  08/20/2014   CLINICAL DATA:  Acute onset of substernal chest pain and shortness of breath. Initial encounter.  EXAM: CHEST  2 VIEW  COMPARISON:  Chest radiograph performed 08/13/2014  FINDINGS: The lungs are well-aerated. Vascular congestion is noted, with bilateral central airspace opacities, concerning for pulmonary edema. No definite pleural effusion or pneumothorax is seen.  The heart is enlarged. A pacemaker/AICD is noted at the left chest wall, with leads ending at the right atrium, right ventricle and coronary sinus. A right-sided tunneled catheter is seen ending within the right atrium. No acute osseous abnormalities are seen.  IMPRESSION: Vascular congestion and cardiomegaly, with bilateral central airspace opacities, concerning for pulmonary edema.   Electronically Signed   By: Garald Balding M.D.   On: 08/20/2014 01:02   Dg Chest Portable 1 View  08/31/2014   CLINICAL DATA:  Chest pain  EXAM: PORTABLE CHEST - 1 VIEW  COMPARISON:  08/29/2014  FINDINGS: Right jugular central  line extends to the right atrium. There are intact appearances of the transvenous leads. There is unchanged cardiomegaly and aortic tortuosity. There is partial clearance of alveolar edema compared to the prior study.  IMPRESSION: Improved, with partial clearance of edema since 08/29/2014. Unchanged cardiomegaly.   Electronically Signed   By: Andreas Newport M.D.   On: 08/31/2014 23:14   Dg Chest Port 1 View  08/29/2014   CLINICAL DATA:  Chest pain.  EXAM: PORTABLE CHEST - 1 VIEW  COMPARISON:  08/19/2014  FINDINGS: Dual lead left-sided pacemaker remains in place. Tip of the right central line in the SVC. Cardiomegaly is unchanged. Pulmonary edema is mildly progressed. No large pleural effusion or pneumothorax. Unchanged osseous structures.  IMPRESSION: Progressive pulmonary edema from prior.  Cardiomegaly is unchanged.   Electronically Signed   By: Jeb Levering M.D.   On: 08/29/2014 04:53   Dg Abd Portable 1v  09/02/2014   CLINICAL DATA:  Acute abdominal pain.  EXAM: PORTABLE ABDOMEN - 1 VIEW  COMPARISON:  Abdomen and pelvis CT obtained on 07/05/2014.  FINDINGS: Borderline dilated small bowel loops. No gross free peritoneal air. Lumbar and lower thoracic spine degenerative changes. Cardiac pacer and AICD leads.  IMPRESSION: Minimal small bowel ileus or partial obstruction.   Electronically Signed   By: Claudie Revering M.D.   On: 09/02/2014 11:15    ECG  AS BiVP  ASSESSMENT AND PLAN  Tyler Christensen is a 81 y.o. male [name] (non-english speaking-spanish speaking) w/ PMHx significant for chronic systolic CHF (EF 11%), NICM, LBBB, PAF (on coumadin), HTN, HLD, CVA, Pulmonary Hypertension and h/o seizure d/o (2/2 CVA 05/2012). Has had numerous admissions recently for decompensated HF (last 3/13-3/25) despite high-dose milrinone at 0.76mcg/kg/min. He was seen the the ER yesterday and discharged.   He presents again today with worsened due to acute on chronic systolic HF. He has associated AKI likely  related to renal venous congestion.  Will plan to admit for IV diuresis.  1. Hold K and spiro given AoCKI with elevated K 2. Convert torsemide 80 BID to Lasix 80mg  IV BID 3. Will otherwise continue home meds Signed, Lamar Sprinkles, MD 09/16/2014, 4:58 AM

## 2014-09-16 NOTE — ED Notes (Signed)
Pt has to have his children help him when he gets up or walks around

## 2014-09-16 NOTE — ED Notes (Signed)
Spanish interpretor used to speak with patient. Pt states he is here for sob, denies any chest pain. Pt has catheter in rt chest that is hooked to a pack attached to his waist which he states is a med for his heart but he is "not a medical person, so he is not sure what it is exactly." pt reports that he has been coming for similar issues "5 times a month." pt alert, oriented, nad.

## 2014-09-16 NOTE — ED Notes (Signed)
MD at bedside. 

## 2014-09-16 NOTE — Progress Notes (Addendum)
ANTICOAGULATION CONSULT NOTE - Initial Consult  Pharmacy Consult for Coumadin Indication: atrial fibrillation  Allergies  Allergen Reactions  . Lisinopril Rash     Vital Signs: BP: 100/55 mmHg (03/29 0600) Pulse Rate: 81 (03/29 0600)  Labs:  Recent Labs  09/15/14 0209 09/16/14 0250  HGB 10.8* 10.6*  HCT 33.8* 32.8*  PLT 384 375  LABPROT 27.1* 28.0*  INR 2.49* 2.59*  CREATININE 1.93* 2.20*    Estimated Creatinine Clearance: 24 mL/min (by C-G formula based on Cr of 2.2).   Medical History: Past Medical History  Diagnosis Date  . HTN (hypertension)   . High cholesterol   . CHF (congestive heart failure)   . BBBB (bilateral bundle branch block)   . LBBB (left bundle branch block)   . Seizures 06/11/2012    new onset/notes (06/11/2012)  . SOB (shortness of breath)     "sometimes when I lay down;; related to not taking my RX" (06/11/2012)  . Myocardial infarction     06/10/2012  . Atrial fibrillation   . Stroke   . Atrial thrombus     left  . Pulmonary HTN   . Atrial fibrillation 06/12/2012  . Coronary artery disease      Assessment: 81yo male w/ many hospital admissions c/o SOB x2d, on home milrinone gtt, CXR c/w vascular congestion and cardiomegaly, admitted for acute on chronic heart failure, to continue Coumadin for Afib; current INR therapeutic w/ last Coumadin dose PTA taken 3/28.  Goal of Therapy:  INR 2-3   Plan:  Coumadin 4 mg po daily Daily INR  Wynona Neat, PharmD, BCPS  09/16/2014,6:31 AM

## 2014-09-16 NOTE — ED Notes (Signed)
Cardiology at BS

## 2014-09-16 NOTE — Progress Notes (Signed)
Physical Therapy Evaluation Patient Details Name: Tyler Christensen MRN: [id number] DOB: 11-08-1932 Today's Date: 09/16/2014   History of Present Illness  Tyler Christensen is a 81 y.o. male [name] w/ PMHx significant for chronic systolic CHF (EF 81%), NICM, LBBB, PAF (on coumadin), HTN, HLD, CVA, Pulmonary Hypertension and h/o seizure d/o (2/2 CVA 05/2012). Has had numerous admissions recently for decompensated HF (last 3/13-3/25) He presents again today with worsened dyspnea.  Clinical Impression  Pt admitted with above complications. Pt currently with functional limitations due to the deficits listed below (see PT Problem List).  Family unavailable to confirm history taken via interpreter today. Concerned with patient's ability to care for himself if he does not have 24 hour supervision. Currently requires min assist for mobility. Very pleasant individual and motivated to work with therapy. SpO2 95% on room air although 3/4 dyspnea on exertion, HR in 80s. Pt will benefit from skilled PT to increase their independence and safety with mobility to allow discharge to the venue listed below.       Follow Up Recommendations SNF;Supervision/Assistance - 24 hour (If family decides to take pt home would benefit from HHPT)    Equipment Recommendations  None recommended by PT    Recommendations for Other Services OT consult     Precautions / Restrictions Precautions Precautions: Fall Restrictions Weight Bearing Restrictions: No      Mobility  Bed Mobility Overal bed mobility: Needs Assistance Bed Mobility: Supine to Sit     Supine to sit: Min assist;HOB elevated     General bed mobility comments: Min assist allowing pt to pull through PTs hand and use of rail with cues for technique.   Transfers Overall transfer level: Needs assistance Equipment used: Rolling walker (2 wheeled) Transfers: Sit to/from Stand Sit to Stand: Min guard         General transfer comment: Close guard for safety  performed from lowest bed setting. VC for hand placement, reinforced when attempting to sit in recliner.  Ambulation/Gait Ambulation/Gait assistance: Min assist Ambulation Distance (Feet): 20 Feet Assistive device: Rolling walker (2 wheeled) Gait Pattern/deviations: Step-through pattern;Decreased stride length;Trunk flexed Gait velocity: slow   General Gait Details: Educated on safe DME use with min assist intermittently for walker control in narrow spaces throughout room. Pt fatigues easily. SpO2 95% on room air although 3/4 dyspnea on exertion, HR in 80s. Tactile cues for upright posture frequently.  Stairs            Wheelchair Mobility    Modified Rankin (Stroke Patients Only)       Balance Overall balance assessment: Needs assistance Sitting-balance support: Feet supported;No upper extremity supported Sitting balance-Leahy Scale: Fair     Standing balance support: Bilateral upper extremity supported Standing balance-Leahy Scale: Poor                               Pertinent Vitals/Pain Pain Assessment: No/denies pain    Home Living Family/patient expects to be discharged to:: Private residence Living Arrangements: Children Available Help at Discharge: Family (conflicting reports of 24 hour supervision) Type of Home: House Home Access: Level entry     Home Layout: One level Home Equipment: Walker - 2 wheels      Prior Function Level of Independence: Needs assistance         Comments: Pt with difficulty answering question via interpreter.     Hand Dominance   Dominant Hand: Right  Extremity/Trunk Assessment   Upper Extremity Assessment: Defer to OT evaluation           Lower Extremity Assessment: Generalized weakness         Communication   Communication: Prefers language other than English  Cognition Arousal/Alertness: Awake/alert Behavior During Therapy: WFL for tasks assessed/performed Overall Cognitive Status:  Difficult to assess                      General Comments General comments (skin integrity, edema, etc.): interpreter: [id number] - Condom cath out when PT uncovered pt. RN notified.    Exercises        Assessment/Plan    PT Assessment Patient needs continued PT services  PT Diagnosis Difficulty walking;Abnormality of gait;Generalized weakness   PT Problem List Decreased strength;Decreased balance;Decreased activity tolerance;Decreased mobility;Decreased range of motion;Decreased knowledge of use of DME;Cardiopulmonary status limiting activity  PT Treatment Interventions DME instruction;Gait training;Functional mobility training;Therapeutic activities;Therapeutic exercise;Balance training;Neuromuscular re-education;Patient/family education   PT Goals (Current goals can be found in the Care Plan section) Acute Rehab PT Goals Patient Stated Goal: none stated PT Goal Formulation: With patient Time For Goal Achievement: 09/30/14 Potential to Achieve Goals: Good    Frequency Min 3X/week   Barriers to discharge Decreased caregiver support Conflicting reports of whether or not family is available 24/7    Co-evaluation               End of Session Equipment Utilized During Treatment: Gait belt Activity Tolerance: Patient tolerated treatment well Patient left: in chair;with call bell/phone within reach;with nursing/sitter in room Nurse Communication: Mobility status (SpO2 95% on room air, left on 2L at end of therapy)         Time: 3546-5681 PT Time Calculation (min) (ACUTE ONLY): 34 min   Charges:   PT Evaluation $Initial PT Evaluation Tier I: 1 Procedure PT Treatments $Therapeutic Activity: 8-22 mins   PT G CodesEllouise Newer 09/16/2014, 2:05 PM Camille Bal [place], [place]

## 2014-09-16 NOTE — ED Notes (Signed)
Pt Spanish speaking only- interpreter used for triage.  Pt reports feeling short of breath X 2 days- denies chest pain.  Pt on continuous Milrinone drip.

## 2014-09-16 NOTE — Progress Notes (Signed)
[facility]  Patient Status: Active pt on long term Milrinone prior to admission  [facility] is providing the following services: [facility] and [facility] for home Milrinone and HF management.  [facility] hospital team will follow pt while in patient and support DC plan when known.  If patient discharges after hours, please call [phone].   Tyler Christensen 09/16/2014, 8:10 AM

## 2014-09-16 NOTE — ED Notes (Signed)
Pt stated that he was trying to use the urinal and had difficulties, and accidentally urinated on him self and the blankets.

## 2014-09-16 NOTE — ED Notes (Signed)
PT has compression stockings on

## 2014-09-16 NOTE — Progress Notes (Signed)
   Patient seen and examined.  Dr. Shanda Bumps H&P reviewed.  Patient improved with IV lasix. Long talk about disposition with him and reiterated need for SNF so that he can get IV lasix as needed to keep him out of hospital. He said he is willing to consider. D/w SW team.   Shoot for d/c on Thursday.   Kearra Calkin,MD 8:29 PM

## 2014-09-16 NOTE — ED Notes (Signed)
PT given breakfast tray  

## 2014-09-17 ENCOUNTER — Encounter (HOSPITAL_COMMUNITY): Payer: Self-pay | Admitting: General Practice

## 2014-09-17 LAB — BASIC METABOLIC PANEL
ANION GAP: 11 (ref 5–15)
BUN: 73 mg/dL — ABNORMAL HIGH (ref 6–23)
CO2: 28 mmol/L (ref 19–32)
Calcium: 8.9 mg/dL (ref 8.4–10.5)
Chloride: 88 mmol/L — ABNORMAL LOW (ref 96–112)
Creatinine, Ser: 1.8 mg/dL — ABNORMAL HIGH (ref 0.50–1.35)
GFR, EST AFRICAN AMERICAN: 40 mL/min — AB (ref >90)
GFR, EST NON AFRICAN AMERICAN: 34 mL/min — AB (ref >90)
Glucose, Bld: 122 mg/dL — ABNORMAL HIGH (ref 70–99)
POTASSIUM: 3.7 mmol/L (ref 3.5–5.1)
Sodium: 127 mmol/L — ABNORMAL LOW (ref 135–145)

## 2014-09-17 LAB — PROTIME-INR
INR: 2.57 — ABNORMAL HIGH (ref 0.00–1.49)
Prothrombin Time: 27.8 seconds — ABNORMAL HIGH (ref 11.6–15.2)

## 2014-09-17 MED ORDER — WARFARIN SODIUM 2 MG PO TABS: 2.0000 mg | ORAL_TABLET | ORAL | Status: DC

## 2014-09-17 MED ORDER — WARFARIN SODIUM 4 MG PO TABS
4.0000 mg | ORAL_TABLET | ORAL | Status: AC
  Administered 2014-09-17: 4 mg via ORAL
  Filled 2014-09-17: qty 1

## 2014-09-17 MED ORDER — TORSEMIDE 20 MG PO TABS
80.0000 mg | ORAL_TABLET | Freq: Two times a day (BID) | ORAL | Status: DC
  Administered 2014-09-17 – 2014-09-18 (×3): 80 mg via ORAL
  Filled 2014-09-17 (×3): qty 4

## 2014-09-17 MED ORDER — POTASSIUM CHLORIDE CRYS ER 20 MEQ PO TBCR
40.0000 meq | EXTENDED_RELEASE_TABLET | Freq: Once | ORAL | Status: AC
  Administered 2014-09-17: 40 meq via ORAL
  Filled 2014-09-17: qty 2

## 2014-09-17 MED ORDER — CETYLPYRIDINIUM CHLORIDE 0.05 % MT LIQD
7.0000 mL | Freq: Two times a day (BID) | OROMUCOSAL | Status: DC
  Administered 2014-09-17 – 2014-09-18 (×4): 7 mL via OROMUCOSAL

## 2014-09-17 NOTE — Progress Notes (Signed)
[place] for Coumadin Indication: atrial fibrillation  Allergies  Allergen Reactions  . Lisinopril Rash     Vital Signs: Temp: 98 F (36.7 C) (03/30 0829) Temp Source: Oral (03/30 0829) BP: 118/60 mmHg (03/30 0540) Pulse Rate: 76 (03/30 0517)  Labs:  Recent Labs  09/15/14 0209 09/16/14 0250 09/17/14 0306  HGB 10.8* 10.6*  --   HCT 33.8* 32.8*  --   PLT 384 375  --   LABPROT 27.1* 28.0* 27.8*  INR 2.49* 2.59* 2.57*  CREATININE 1.93* 2.20* 1.80*    Estimated Creatinine Clearance: 29.9 mL/min (by C-G formula based on Cr of 1.8).   Medical History: Past Medical History  Diagnosis Date  . HTN (hypertension)   . High cholesterol   . CHF (congestive heart failure)   . BBBB (bilateral bundle branch block)   . LBBB (left bundle branch block)   . Seizures 06/11/2012    new onset/notes (06/11/2012)  . SOB (shortness of breath)     "sometimes when I lay down;; related to not taking my RX" (06/11/2012)  . Myocardial infarction     06/10/2012  . Atrial fibrillation   . Stroke   . Atrial thrombus     left  . Pulmonary HTN   . Atrial fibrillation 06/12/2012  . Coronary artery disease      Assessment: 81yo male w/ many hospital admissions c/o SOB x2d, on home milrinone gtt, CXR c/w vascular congestion and cardiomegaly, admitted for acute on chronic heart failure, to continue Coumadin for Afib; current INR therapeutic w/ last Coumadin dose PTA taken 3/28.  INR remains therapeutic  Goal of Therapy:  INR 2-3   Plan:  Coumadin 4 mg po daily Daily INR  Thank you. Anette Guarneri, PharmD [phone] 09/17/2014,11:28 AM  __________________________________ Pt is a poor medication historian; therefore, his warfarin med rec is inaccurate.  HHRN does his meds daily.  His PTA warfarin dose is 2mg  TThSat, 4mg  all other days.  Goal of Therapy:  INR 2-3   Plan:  Continue home dose 2mg  TThSat, 4mg  all other days Daily INR, Q72h CBC,  s/sx of bleeding  Drucie Opitz, PharmD Clinical Pharmacy Resident Pager: [phone] 09/17/2014 11:30 AM

## 2014-09-17 NOTE — Progress Notes (Signed)
Interpreter Lesle Chris for [facility] admitting nurse.

## 2014-09-17 NOTE — Plan of Care (Signed)
Problem: Phase I Progression Outcomes Goal: Voiding-avoid urinary catheter unless indicated Outcome: Completed/Met Date Met:  09/17/14 Condom cath Goal: Hemodynamically stable Outcome: Progressing B/P soft continue to monitor

## 2014-09-17 NOTE — Progress Notes (Signed)
Advanced Heart Failure Rounding Note   Subjective:    Feels better. Denies dyspnea or ab pain. Lying flat.     Objective:   Weight Range:  Vital Signs:   Temp:  [97.6 F (36.4 C)-98 F (36.7 C)] 98 F (36.7 C) (03/30 0829) Pulse Rate:  [76-82] 76 (03/30 0517) Resp:  [17-36] 17 (03/30 0540) BP: (83-151)/(38-127) 118/60 mmHg (03/30 0540) SpO2:  [94 %-100 %] 97 % (03/30 0517) Weight:  [63.504 kg (140 lb)] 63.504 kg (140 lb) (03/30 0400)    Weight change: Filed Weights   09/17/14 0400  Weight: 63.504 kg (140 lb)    Intake/Output:   Intake/Output Summary (Last 24 hours) at 09/17/14 1316 Last data filed at 09/17/14 1040  Gross per 24 hour  Intake 523.75 ml  Output   2400 ml  Net -1876.25 ml     Physical Exam: General: Elderly. Lying in bed. NAD. HEENT: normal Neck: supple. JVP 7-8 Carotids 2+ bilaterally; no bruits. No lymphadenopathy or thryomegaly appreciated.  Cor: PMI laterally displaced. Regular rhythm and rate. + s3  Lungs: clear Abdomen: soft, nontender, non distended. No hepatosplenomegaly. No bruits or masses. + high-pitched bowel sounds. Extremities: no cyanosis, clubbing, rash. No edema Neuro: alert & orientedx3, cranial nerves grossly intact. Moves all 4 extremities w/o difficulty. Affect pleasant.  Telemetry: NSR  Labs: Basic Metabolic Panel:  Recent Labs Lab 09/11/14 0525 09/12/14 0505 09/15/14 0209 09/16/14 0250 09/17/14 0306  NA 128* 126* 126* 125* 127*  K 2.6* 2.4* 4.4 5.2* 3.7  CL 85* 85* 89* 89* 88*  CO2 30 29 24 21 28   GLUCOSE 134* 172* 206* 183* 122*  BUN 65* 65* 66* 79* 73*  CREATININE 1.44* 1.58* 1.93* 2.20* 1.80*  CALCIUM 8.8 8.6 9.4 9.2 8.9    Liver Function Tests:  Recent Labs Lab 09/16/14 0250  AST 35  ALT 42  ALKPHOS 223*  BILITOT 0.9  PROT 7.2  ALBUMIN 3.4*   No results for input(s): LIPASE, AMYLASE in the last 168 hours. No results for input(s): AMMONIA in the last 168 hours.  CBC:  Recent Labs Lab  09/12/14 0505 09/15/14 0209 09/16/14 0250  WBC 11.5* 15.9* 14.4*  NEUTROABS  --   --  12.3*  HGB 9.6* 10.8* 10.6*  HCT 30.1* 33.8* 32.8*  MCV 79.2 79.3 79.8  PLT 322 384 375    Cardiac Enzymes: No results for input(s): CKTOTAL, CKMB, CKMBINDEX, TROPONINI in the last 168 hours.  BNP: BNP (last 3 results)  Recent Labs  08/31/14 2128 09/15/14 0209 09/16/14 0250  BNP 3926.7* 2970.9* 3495.2*    ProBNP (last 3 results)  Recent Labs  03/01/14 1959 03/08/14 2230 06/05/14 0332  PROBNP 11880.0* 15334.0* 19533.0*      Other results:  Imaging: Dg Chest 2 View  09/16/2014   CLINICAL DATA:  Acute onset of shortness breath and leg swelling. Initial encounter.  EXAM: CHEST  2 VIEW  COMPARISON:  Chest radiograph performed 09/15/2014  FINDINGS: The lungs are well-aerated. Vascular congestion is noted. Mild bibasilar opacities appear mildly worsened, raising concern for mildly worsened interstitial edema. No definite pleural effusion or pneumothorax is seen.  The heart is enlarged. A pacemaker/AICD is noted at the left chest wall, with leads ending at the right atrium, right ventricle and coronary sinus. A right-sided catheter is noted ending within the proximal right atrium. No acute osseous abnormalities are seen.  IMPRESSION: Vascular congestion and cardiomegaly. Mild bibasilar airspace opacities appear mildly worsened, raising concern for mildly worsened interstitial  edema.   Electronically Signed   By: Garald Balding M.D.   On: 09/16/2014 03:32      Medications:     Scheduled Medications: . amiodarone  200 mg Oral Daily  . antiseptic oral rinse  7 mL Mouth Rinse BID  . furosemide  80 mg Intravenous BID  . metoCLOPramide  10 mg Oral BID  . metolazone  5 mg Oral Daily  . polyethylene glycol  17 g Oral Daily  . sildenafil  20 mg Oral TID  . sodium chloride  3 mL Intravenous Q12H  . sorbitol  30 mL Oral Daily  . [START ON 09/18/2014] warfarin  2 mg Oral Once per day on Tue  Thu Sat  . warfarin  4 mg Oral Once per day on Sun Mon Wed Fri  . Warfarin - Pharmacist Dosing Inpatient   Does not apply q1800     Infusions: . milrinone 0.5 mcg/kg/min (09/17/14 0612)     PRN Medications:  sodium chloride, acetaminophen, LORazepam, ondansetron (ZOFRAN) IV, sodium chloride, traMADol   Assessment:   1. Acute on chronic systolic HF (EF 01%) on home milrinone 2. Acute on CKD, stage III-IV 3. Hyperkalemia 4. PAF in NSR on amio 5. Retroperitoneal mass. 6. hyponatremia 7. PAH 8. Hypocalcemia 9. Acute on chronic respiratory failure 10. DNR  Plan/Discussion:    Volume status and renal function are better. [name] 70-80s. Will stop IV lasix. Resume po torsemide. Will am for d/c to [facility] (on [address]) tomorrow. Suspect he will need IV lasix 2-3x per week to keep him out of hospital.  Can they give this to him at [facility] via his PICC? Will shoot for M/W/F IV lasix.   Length of Stay: 1  Glori Bickers  MD  09/17/2014, 1:16 PM  Advanced Heart Failure Team Pager [phone] (M-F; [place])  Please contact [facility] for night-coverage after hours (4p -7a ) and weekends on [email or web address]

## 2014-09-17 NOTE — Progress Notes (Signed)
Spoke with Cardiology , End, MD concerning patients hypotension and his very high BNP. End, MD gave this RN verbal hold orders to enter for Lasix. Hold lasix for [name] <80. He also gave me the order to go ahead and give Mr. Grigg one of his day doses that he missed. Patients BP works well on his left thigh.  Patients blood pressure tolerated the lasix well.

## 2014-09-17 NOTE — Progress Notes (Signed)
Clinical Social Work Department CLINICAL SOCIAL WORK PLACEMENT NOTE 09/17/2014  Patient:  MANU, RUBEY  Account Number:  [id number] Admit date:  09/16/2014  Clinical Social Worker:  Adair Laundry  Date/time:  09/17/2014 03:50 PM  Clinical Social Work is seeking post-discharge placement for this patient at the following level of care:   SKILLED NURSING   (*CSW will update this form in Epic as items are completed)   09/17/2014  Patient/family provided with [place] Department of Clinical Social Work's list of facilities offering this level of care within the geographic area requested by the patient (or if unable, by the patient's family).  09/17/2014  Patient/family informed of their freedom to choose among providers that offer the needed level of care, that participate in Medicare, Medicaid or managed care program needed by the patient, have an available bed and are willing to accept the patient.  09/17/2014  Patient/family informed of [facility]' ownership interest in [facility], as well as of the fact that they are under no obligation to receive care at this facility.  PASARR submitted to EDS on existing PASARR number received on   FL2 transmitted to all facilities in geographic area requested by pt/family on  09/17/2014 FL2 transmitted to all facilities within larger geographic area on   Patient informed that his/her managed care company has contracts with or will negotiate with  certain facilities, including the following:     Patient/family informed of bed offers received:   Patient chooses bed at  Physician recommends and patient chooses bed at    Patient to be transferred to  on   Patient to be transferred to facility by  Patient and family notified of transfer on  Name of family member notified:    The following physician request were entered in Epic: Physician Request  Please sign FL2.    Additional CommentsBerton Mount,  [place]

## 2014-09-17 NOTE — Clinical Social Work Psychosocial (Signed)
     Clinical Social Work Department BRIEF PSYCHOSOCIAL ASSESSMENT 09/17/2014  Patient:  Tyler Christensen, Tyler Christensen     Account Number:  [id number]     Admit date:  09/16/2014  Clinical Social Worker:  Adair Laundry  Date/Time:  09/17/2014 03:28 PM  Referred by:  Physician  Date Referred:  09/17/2014 Referred for  SNF Placement   Other Referral:   Interview type:  Patient Other interview type:    PSYCHOSOCIAL DATA Living Status:  WIFE Admitted from facility:   Level of care:   Primary support name:  Premitila Odetta Pink Primary support relationship to patient:  SPOUSE Degree of support available:   Pt has good emotional support    CURRENT CONCERNS Current Concerns  Post-Acute Placement   Other Concerns:    SOCIAL WORK ASSESSMENT / PLAN CSW familiar with pt from previous admission. CSW made aware that pt is agreeable for dc to SNF this admission. Using pacific interpreter line, CSW spoke with pt and introduced self. CSW spoke with pt about dc plan and asked if pt was agreeable to dc to SNF at [facility]. Pt confirmed that he would agree to this plan. From previous admission, CSW aware that pt wife wanting to be kept updated on all plans. CSW attempted to call pt wife while pt also present, but there was no answer and CSW unable to leave voicemail. CSW will proceed with sending pt information to SNF but anticipate potential issues if unable to reach pt wife prior to discharge. Will continue to try and will also ask nursing staff to contact CSW if pt wife at bedside.   Assessment/plan status:  Psychosocial Support/Ongoing Assessment of Needs Other assessment/ plan:   Information/referral to community resources:   SNF list previously provided    PATIENTS/FAMILYS RESPONSE TO PLAN OF CARE: Pt is agreeable to SNF at this time but CSW still trying to reach family to confirm.      [place], [place]

## 2014-09-17 NOTE — Progress Notes (Signed)
Asked by Unit CSW to f/u with pt's family this pm re: SNF placement.  CSW spoke with pt, his wife and sons at bedside though telephonic interpreting.  At this time, pt is refusing SNF and his wife/family are supportive of this plan.  Wife believes that pt would try and escape from a facility when she was not there with him.  Pt/family wish to return home with [facility] services in place, and also expressed interest in Adult Day Care programs.  CSW to provide info prior to d/c.

## 2014-09-17 NOTE — Progress Notes (Signed)
[place] for Coumadin Indication: atrial fibrillation  Allergies  Allergen Reactions  . Lisinopril Rash     Vital Signs: Temp: 98 F (36.7 C) (03/30 0829) Temp Source: Oral (03/30 0829) BP: 118/60 mmHg (03/30 0540) Pulse Rate: 76 (03/30 0517)  Labs:  Recent Labs  09/15/14 0209 09/16/14 0250 09/17/14 0306  HGB 10.8* 10.6*  --   HCT 33.8* 32.8*  --   PLT 384 375  --   LABPROT 27.1* 28.0* 27.8*  INR 2.49* 2.59* 2.57*  CREATININE 1.93* 2.20* 1.80*    Estimated Creatinine Clearance: 29.9 mL/min (by C-G formula based on Cr of 1.8).   Medical History: Past Medical History  Diagnosis Date  . HTN (hypertension)   . High cholesterol   . CHF (congestive heart failure)   . BBBB (bilateral bundle branch block)   . LBBB (left bundle branch block)   . Seizures 06/11/2012    new onset/notes (06/11/2012)  . SOB (shortness of breath)     "sometimes when I lay down;; related to not taking my RX" (06/11/2012)  . Myocardial infarction     06/10/2012  . Atrial fibrillation   . Stroke   . Atrial thrombus     left  . Pulmonary HTN   . Atrial fibrillation 06/12/2012  . Coronary artery disease      Assessment: 81yo male w/ many hospital admissions c/o SOB x2d, on home milrinone gtt, CXR c/w vascular congestion and cardiomegaly, admitted for acute on chronic heart failure, to continue Coumadin for Afib; current INR therapeutic w/ last Coumadin dose PTA taken 3/28.  INR remains therapeutic  Goal of Therapy:  INR 2-3   Plan:  Coumadin 4 mg po daily Daily INR  Thank you. Anette Guarneri, PharmD [phone] 09/17/2014,8:53 AM

## 2014-09-18 LAB — BASIC METABOLIC PANEL
Anion gap: 12 (ref 5–15)
BUN: 66 mg/dL — ABNORMAL HIGH (ref 6–23)
CO2: 30 mmol/L (ref 19–32)
CREATININE: 1.66 mg/dL — AB (ref 0.50–1.35)
Calcium: 8.8 mg/dL (ref 8.4–10.5)
Chloride: 82 mmol/L — ABNORMAL LOW (ref 96–112)
GFR calc Af Amer: 44 mL/min — ABNORMAL LOW (ref >90)
GFR, EST NON AFRICAN AMERICAN: 38 mL/min — AB (ref >90)
GLUCOSE: 160 mg/dL — AB (ref 70–99)
Potassium: 3 mmol/L — ABNORMAL LOW (ref 3.5–5.1)
SODIUM: 124 mmol/L — AB (ref 135–145)

## 2014-09-18 LAB — PROTIME-INR
INR: 2.85 — ABNORMAL HIGH (ref 0.00–1.49)
Prothrombin Time: 30.2 seconds — ABNORMAL HIGH (ref 11.6–15.2)

## 2014-09-18 MED ORDER — SPIRONOLACTONE 25 MG PO TABS: 25.0000 mg | ORAL_TABLET | Freq: Two times a day (BID) | ORAL | Status: AC

## 2014-09-18 MED ORDER — MILRINONE IN DEXTROSE 20 MG/100ML IV SOLN: 0.5000 ug/kg/min | INTRAVENOUS | Status: DC

## 2014-09-18 MED ORDER — FUROSEMIDE 10 MG/ML IJ SOLN: 80.0000 mg | INTRAMUSCULAR | Status: DC

## 2014-09-18 MED ORDER — SPIRONOLACTONE 25 MG PO TABS
25.0000 mg | ORAL_TABLET | Freq: Two times a day (BID) | ORAL | Status: DC
  Administered 2014-09-18 (×2): 25 mg via ORAL
  Filled 2014-09-18 (×3): qty 1

## 2014-09-18 MED ORDER — SORBITOL 70 % SOLN: 30.0000 mL | Freq: Every day | Status: AC

## 2014-09-18 MED ORDER — WARFARIN SODIUM 4 MG PO TABS: 4.0000 mg | ORAL_TABLET | Freq: Every day | ORAL | Status: AC

## 2014-09-18 MED ORDER — WARFARIN SODIUM 2 MG PO TABS
2.0000 mg | ORAL_TABLET | ORAL | Status: DC
  Administered 2014-09-18: 2 mg via ORAL
  Filled 2014-09-18: qty 1

## 2014-09-18 MED ORDER — POTASSIUM CHLORIDE CRYS ER 20 MEQ PO TBCR: 40.0000 meq | EXTENDED_RELEASE_TABLET | Freq: Two times a day (BID) | ORAL | Status: DC

## 2014-09-18 MED ORDER — WARFARIN SODIUM 4 MG PO TABS: 4.0000 mg | ORAL_TABLET | ORAL | Status: DC

## 2014-09-18 MED ORDER — SILDENAFIL CITRATE 20 MG PO TABS
20.0000 mg | ORAL_TABLET | Freq: Three times a day (TID) | ORAL | Status: DC
Start: 2014-09-18 — End: 2014-11-06

## 2014-09-18 MED ORDER — TORSEMIDE 20 MG PO TABS: 80.0000 mg | ORAL_TABLET | Freq: Two times a day (BID) | ORAL | Status: DC

## 2014-09-18 MED ORDER — POTASSIUM CHLORIDE CRYS ER 20 MEQ PO TBCR
60.0000 meq | EXTENDED_RELEASE_TABLET | Freq: Two times a day (BID) | ORAL | Status: DC
  Administered 2014-09-18: 60 meq via ORAL
  Filled 2014-09-18: qty 3

## 2014-09-18 NOTE — Progress Notes (Signed)
Patient d/c'd home today to care of his family. He declined SNF placement at [facility].  Information provided to patient/family for the [facility] for Adult Day Care.  Patient discussed with Dr. Haroldine Laws.  Lorie [place]ix. Pauline Good, [place]

## 2014-09-18 NOTE — Progress Notes (Signed)
Patient is active with [facility] as prior to admission; Attending MD please order [facility] for resumption of services at discharge; Aneta Mins [phone]

## 2014-09-18 NOTE — Progress Notes (Signed)
Advanced Heart Failure Rounding Note   Subjective:    Feels better. Denies dyspnea or ab pain.  Now refusing to go to SNF.     Objective:   Weight Range:  Vital Signs:   Temp:  [97.6 F (36.4 C)-98.2 F (36.8 C)] 98 F (36.7 C) (03/31 1206) Pulse Rate:  [72-76] 74 (03/31 1206) Resp:  [12-24] 12 (03/31 1206) BP: (81-121)/(41-80) 100/80 mmHg (03/31 1206) SpO2:  [96 %-100 %] 96 % (03/31 1206) Weight:  [58.968 kg (130 lb)] 58.968 kg (130 lb) (03/31 0700) Last BM Date: 09/18/14  Weight change: Filed Weights   09/17/14 0400 09/18/14 0700  Weight: 63.504 kg (140 lb) 58.968 kg (130 lb)    Intake/Output:   Intake/Output Summary (Last 24 hours) at 09/18/14 1440 Last data filed at 09/18/14 1027  Gross per 24 hour  Intake 1151.5 ml  Output   2075 ml  Net -923.5 ml     Physical Exam: General: Elderly. Sitting on side of bed. NAD. HEENT: normal Neck: supple. JVP 7-8 Carotids 2+ bilaterally; no bruits. No lymphadenopathy or thryomegaly appreciated.  Cor: PMI laterally displaced. Regular rhythm and rate. + s3  Lungs: clear Abdomen: soft, nontender, non distended. No hepatosplenomegaly. No bruits or masses. + high-pitched bowel sounds. Extremities: no cyanosis, clubbing, rash. No edema Neuro: alert & orientedx3, cranial nerves grossly intact. Moves all 4 extremities w/o difficulty. Affect pleasant.  Telemetry: NSR  Labs: Basic Metabolic Panel:  Recent Labs Lab 09/12/14 0505 09/15/14 0209 09/16/14 0250 09/17/14 0306 09/18/14 0445  NA 126* 126* 125* 127* 124*  K 2.4* 4.4 5.2* 3.7 3.0*  CL 85* 89* 89* 88* 82*  CO2 29 24 21 28 30   GLUCOSE 172* 206* 183* 122* 160*  BUN 65* 66* 79* 73* 66*  CREATININE 1.58* 1.93* 2.20* 1.80* 1.66*  CALCIUM 8.6 9.4 9.2 8.9 8.8    Liver Function Tests:  Recent Labs Lab 09/16/14 0250  AST 35  ALT 42  ALKPHOS 223*  BILITOT 0.9  PROT 7.2  ALBUMIN 3.4*   No results for input(s): LIPASE, AMYLASE in the last 168 hours. No  results for input(s): AMMONIA in the last 168 hours.  CBC:  Recent Labs Lab 09/12/14 0505 09/15/14 0209 09/16/14 0250  WBC 11.5* 15.9* 14.4*  NEUTROABS  --   --  12.3*  HGB 9.6* 10.8* 10.6*  HCT 30.1* 33.8* 32.8*  MCV 79.2 79.3 79.8  PLT 322 384 375    Cardiac Enzymes: No results for input(s): CKTOTAL, CKMB, CKMBINDEX, TROPONINI in the last 168 hours.  BNP: BNP (last 3 results)  Recent Labs  08/31/14 2128 09/15/14 0209 09/16/14 0250  BNP 3926.7* 2970.9* 3495.2*    ProBNP (last 3 results)  Recent Labs  03/01/14 1959 03/08/14 2230 06/05/14 0332  PROBNP 11880.0* 15334.0* 19533.0*      Other results:  Imaging: No results found.   Medications:     Scheduled Medications: . amiodarone  200 mg Oral Daily  . antiseptic oral rinse  7 mL Mouth Rinse BID  . metoCLOPramide  10 mg Oral BID  . polyethylene glycol  17 g Oral Daily  . potassium chloride  60 mEq Oral BID  . sildenafil  20 mg Oral TID  . sodium chloride  3 mL Intravenous Q12H  . sorbitol  30 mL Oral Daily  . spironolactone  25 mg Oral BID  . torsemide  80 mg Oral BID  . warfarin  2 mg Oral Once per day on Tue Thu Sat  . [  START ON 09/19/2014] warfarin  4 mg Oral Once per day on Sun Mon Wed Fri  . Warfarin - Pharmacist Dosing Inpatient   Does not apply q1800    Infusions: . milrinone 0.5 mcg/kg/min (09/17/14 1825)    PRN Medications: sodium chloride, acetaminophen, LORazepam, ondansetron (ZOFRAN) IV, sodium chloride, traMADol   Assessment:   1. Acute on chronic systolic HF (EF 59%) on home milrinone 2. Acute on CKD, stage III-IV 3. Hyperkalemia 4. PAF in NSR on amio 5. Retroperitoneal mass. 6. hyponatremia 7. PAH 8. Hypocalcemia 9. Acute on chronic respiratory failure 10. DNR  Plan/Discussion:    Volume status and renal function are better. Now refusing SNF.   Will plan d/c home with  Torsemide 80 bid  Spiro 25 bid Kcl 40 bid Milrinone 0.5  On Monday/Wednesday/Friday  mornings would replace torsemide with lasix 80 IV. D/W [facility].   Length of Stay: 2 Glori Bickers  MD  09/18/2014, 2:40 PM Advanced Heart Failure Team Pager [phone] (M-F; [place])  Please contact [facility] for night-coverage after hours (4p -7a ) and weekends on [email or web address]

## 2014-09-18 NOTE — Progress Notes (Signed)
PT Cancellation Note  Patient Details Name: Tyler Christensen MRN: [id number] DOB: September 10, 1932   Cancelled Treatment:    Reason Eval/Treat Not Completed: Fatigue/lethargy limiting ability to participate;Other (comment) (Multiple attempts, interfered with by having just walked) with nursing then nauseous then meal is there.  Will try later if able.   Ramond Dial 09/18/2014, 1:42 PM   [name], PT MS Acute Rehab Dept. Number: [phone]

## 2014-09-18 NOTE — Discharge Instructions (Signed)
Insuficiencia cardaca  (Heart Failure)  Insuficiencia cardaca significa que el corazn tiene problemas para bombear la [place]. Esto dificulta el buen funcionamiento del organismo. La insuficiencia cardaca es una enfermedad de larga duracin (crnica). Es importante que se cuide mucho y que siga el plan de tratamiento que le indique el mdico. CUIDADOS EN EL [employer] medicamentos para el corazn tal como se los prescribi el mdico.  No deje de tomar medicamentos excepto que su mdico lo indique  No se saltee ninguna dosis del medicamento.  Provase de los medicamentos antes de que se acaben.  Tome medicamentos slo como lo indique su mdico o Development worker, international aid.  Permanezca activo si el mdico lo indica. Las Financial trader y los que tengan insuficiencia cardaca grave deben hablar con el mdico acerca de la actividad fsica.  Consuma alimentos saludables para el corazn. Elija alimentos que no contengan grasas trans y sean bajas en grasas saturadas, colesterol y sal (sodio). Esto incluye frutas frescas o congeladas y vegetales, pescado, carnes [place], productos lcteos sin grasa o bajos en grasa, granos enteros y alimentos ricos en fibra. Las lentejas, arvejas y frijoles (legumbres) son tambin buenas opciones.  Limitar el consumo de sal segn lo aconsejado por su mdico.  Cocine en forma saludable. Prepare los alimentos asados, a la parrilla, al horno, hervidos, al vapor o salteados.  Limite los lquidos segn lo aconsejado por su mdico.  Controle su peso todas las [place]. Hgalo despus de hacer pis (orinar) y antes de tomar el desayuno. Anote su peso para llevarlo a la consulta con el mdico.  Tmese la presin arterial y antela, si su mdico se lo indica.  Pregunte al mdico como controlarse el pulso. Controle su pulso segn las indicaciones.  Baje de peso si el mdico se lo indica.  Deje de fumar o mascar tabaco. No use goma de Higher education careers adviser o parches para dejar de fumar sin  la aprobacin de su mdico.  Programe y concurra a las citas con el mdico segn lo indicado.  Las mujeres no embarazadas no deben tomar ms de 1 bebida al [employer]. Los hombres no deben tomar ms de 2 bebidas al [employer]. Hable con su mdico acerca de su consumo de alcohol.  No consuma drogas.  Lyle (immunizaciones).  Controle sus enfermedades segn lo indicado por su mdico.  Aprenda a Engineer, maintenance (IT).  Descanse cuando se sienta cansado.  Si hace mucho calor en el exterior:  Evite las actividades intensas.  Utilice aire acondicionado o ventiladores, o pngase en un lugar ms fresco.  Evite la cafena y el alcohol.  Use ropa holgada, ligera y de colores claros.  Si hace mucho fro en el exterior:  Evite las actividades intensas.  Vstase con ropa en capas.  Use mitones o guantes, un sombrero y [place] bufanda cuando salga.  Evite el alcohol.  Aprenda todo sobre la insuficiencia cardaca y [place] apoyo si lo necesita.  Obtenga ayuda para mantener o mejorar su calidad de vida y su capacidad para cuidarse a s mismo, si lo necesita. SOLICITE AYUDA SI:   Aumenta 3 libras (1,4 kg) o ms en un da o 5 libras (2,3 Kg) en una semana.  Le falta el aire ms que lo habitual.  No puede hacer sus actividades habituales.  Se cansa con facilidad.  Tose ms de lo normal, especialmente al realizar actividad fsica.  Observa que se le hinchan o le aumenta la hinchazn (inflamacin) en reas como las [place],  los pies, los tobillos o el vientre (abdomen).  Le cuesta dormir debido a que Film/video editor.  Siente que el corazn palpita rpido (palpitaciones).  Siente mareos o vahdos al pararse. SOLICITE AYUDA DE INMEDIATO SI:   Tiene dificultad para respirar.  Hay un cambio en su estado mental, como estar menos alerta o no poder concentrarse.  Siente dolor u opresin en el pecho.  Se desmaya. ASEGRESE DE QUE:   Comprende estas  instrucciones.  Controlar su enfermedad.  Solicitar ayuda de inmediato si no mejora o si empeora. Document Released: 09/02/2008 Document Revised: 10/01/2012 [facility] Patient Information 2015 [place], [place]. This information is not intended to replace advice given to you by your health care provider. Make sure you discuss any questions you have with your health care provider.   Per medication instruction, take torsemide 80mg  BID, however replace every Monday, Wednesday and Friday's morning dose of torsemide with 80mg  IV lasix given by [place]o.

## 2014-09-18 NOTE — Discharge Summary (Signed)
Discharge Summary   Patient ID: Tyler Christensen,  MRN: [id number], DOB/AGE: [birth year] 81 y.o.  Admit date: 09/16/2014 Discharge date: 09/18/2014  Primary Care Provider: Harvie Junior Primary Cardiologist: Dr. Haroldine Laws  Discharge Diagnoses Principal Problem:   Acute on chronic systolic heart failure Active Problems:   HTN (hypertension)   Left bundle branch block   NICM (nonischemic cardiomyopathy)   HLD (hyperlipidemia)   PAH (pulmonary artery hypertension)   Long-term (current) use of anticoagulants   ICD (implantable cardioverter-defibrillator), biventricular, in situ   CKD (chronic kidney disease) stage 3, GFR 30-59 ml/min   Allergies Allergies  Allergen Reactions  . Lisinopril Rash    Hospital Course  [name] is a 81 year old [name] with past medical history significant for chronic systolic heart failure with baseline EF 20%, NICM, chronic LBBB, PAF on Coumadin, HTN, HLD, CVA, pulmonary hypertension and a history of seizure disorder. He has had multiple admissions for decompensated heart failure in the past 6 months. He presented to [facility] ED on 09/16/2014 with worsening dyspnea. He was admitted for IV diuresis.   He was seen by heart failure service on the following day, at which time Dr. Haroldine Laws reiterated the need for skilled nursing facility so he can get IV Lasix as needed to keep him out of the hospital. He wanted to consider it. He was transitioned to PO torsemide on 09/17/2014. Social worker initially arranged for the patient to go to a skilled nursing facility, however he refused SNF on 3/30, his wife/family was supportive of this plan. Patient was seen in the morning of 09/18/2014, his volume status and renal function stable, he is deemed stable for discharge from heart failure perspective.  Medication change include decrease of spironolactone from 50 mg twice a day to 25 mg twice a day. Decrease of KCl 60 meq BID down to 40 meq BID. Further  adjustment of his medication include continuation of previous dose of 80 mg twice a day of torsemide, however replace Monday/Wednesday/Friday morning torsemide with his 80 mg IV Lasix. I will discuss with case manager to confirm with advanced home care to give him IV Lasix at home.   Of note, his discharge weight is unreliable, as his weight on 3/30 was 140 pounds, and his weight on 3/31 was 130 lbs.    Discharge Vitals Blood pressure 100/80, pulse 74, temperature 98 F (36.7 C), temperature source Oral, resp. rate 12, weight 130 lb (58.968 kg), SpO2 96 %.  Filed Weights   09/17/14 0400 09/18/14 0700  Weight: 140 lb (63.504 kg) 130 lb (58.968 kg)    Labs  CBC  Recent Labs  09/16/14 0250  WBC 14.4*  NEUTROABS 12.3*  HGB 10.6*  HCT 32.8*  MCV 79.8  PLT 749   Basic Metabolic Panel  Recent Labs  09/17/14 0306 09/18/14 0445  NA 127* 124*  K 3.7 3.0*  CL 88* 82*  CO2 28 30  GLUCOSE 122* 160*  BUN 73* 66*  CREATININE 1.80* 1.66*  CALCIUM 8.9 8.8   Liver Function Tests  Recent Labs  09/16/14 0250  AST 35  ALT 42  ALKPHOS 223*  BILITOT 0.9  PROT 7.2  ALBUMIN 3.4*    Disposition  Pt is being discharged home today in good condition.  Follow-up Plans & Appointments      Follow-up Information    Follow up with [facility].   Why:  they will provide your home health nurse at your home. [facility] to take care of his IV milrinone and IV lasix. He will be on PO torsemide BID, however his Mon/Wed/Fri morning torsemide will be replaced with 80mg  IV lasix.   Contact information:   [address] [place] [address] [phone]       Follow up with [facility] On 09/25/2014.   Specialty:  Cardiology   Why:  12:00pm. Garage Code: [id number]   Contact information:   [address] [id number] [place] [phone]      Discharge Medications      Medication List    STOP taking these medications        metolazone 5 MG tablet  Commonly known as:  ZAROXOLYN      TAKE these medications        acetaminophen 325 MG tablet  Commonly known as:  TYLENOL  Take 2 tablets (650 mg total) by mouth every 6 (six) hours as needed for mild pain (or Fever >/= 101).     amiodarone 200 MG tablet  Commonly known as:  PACERONE  Take 1 tablet (200 mg total) by mouth daily.     furosemide 10 MG/ML injection  Commonly known as:  LASIX  Inject 8 mLs (80 mg total) into the vein 3 (three) times a week. To replace Mon/Wed/Fri morning dose of torsemide with 80mg  IV lasix.     LORazepam 1 MG tablet  Commonly known as:  ATIVAN  Take 1 tablet (1 mg total) by mouth 3 (three) times daily as needed for anxiety.     magnesium oxide 400 MG tablet  Commonly known as:  MAG-OX  Take 1 tablet (400 mg total) by mouth 2 (two) times daily.     metoCLOPramide 10 MG tablet  Commonly known as:  REGLAN  Take 1 tablet (10 mg total) by mouth 2 (two) times daily.     milrinone 20 MG/100ML Soln infusion  Commonly known as:  PRIMACOR  Inject 33.95 mcg/min into the vein continuous.     polyethylene glycol packet  Commonly known as:  MIRALAX / GLYCOLAX  Take 17 g by mouth daily.     potassium chloride SA 20 MEQ tablet  Commonly known as:  K-DUR,KLOR-CON  Take 2 tablets (40 mEq total) by mouth 2 (two) times daily.     sildenafil 20 MG tablet  Commonly known as:  REVATIO  Take 1 tablet (20 mg total) by mouth 3 (three) times daily.     sorbitol 70 % Soln  Take 30 mLs by mouth daily.     spironolactone 25 MG tablet  Commonly known as:  ALDACTONE  Take 1 tablet (25 mg total) by mouth 2 (two) times daily.     torsemide 20 MG tablet  Commonly known as:  DEMADEX  Take 4 tablets (80 mg total) by mouth 2 (two) times daily. Replace Monday Wednesday and Friday morning demadex with 80mg  IV lasix. [facility] to manage     traMADol 50 MG tablet  Commonly known as:  ULTRAM   Take 1 tablet (50 mg total) by mouth 2 (two) times daily as needed. for pain     warfarin 4 MG tablet  Commonly known as:  COUMADIN  Take 1 tablet (4 mg total) by mouth daily at 6 PM. Take 4 mg Monday, Wednesday, Friday and Sunday. 2 mg on Tues, Thurs and Sat       [facility] to take care of his IV  milrinone and IV lasix. He will be on PO torsemide BID, however his Mon/Wed/Fri morning torsemide will be replaced with 80mg  IV lasix.  Duration of Discharge Encounter   Greater than 30 minutes including physician time.  Hilbert Corrigan PA-C Pager: [phone] 09/18/2014, 4:31 PM

## 2014-09-18 NOTE — Progress Notes (Signed)
ANTICOAGULATION CONSULT NOTE - FOLLOW-UP  Pharmacy Consult for Coumadin Indication: atrial fibrillation  Allergies  Allergen Reactions  . Lisinopril Rash     Vital Signs: Temp: 97.8 F (36.6 C) (03/31 0752) Temp Source: Oral (03/31 0505) BP: 98/61 mmHg (03/31 0752) Pulse Rate: 73 (03/31 0752)  Labs:  Recent Labs  09/16/14 0250 09/17/14 0306 09/18/14 0445  HGB 10.6*  --   --   HCT 32.8*  --   --   PLT 375  --   --   LABPROT 28.0* 27.8* 30.2*  INR 2.59* 2.57* 2.85*  CREATININE 2.20* 1.80* 1.66*    Estimated Creatinine Clearance: 30.1 mL/min (by C-G formula based on Cr of 1.66).   Medical History: Past Medical History  Diagnosis Date  . HTN (hypertension)   . High cholesterol   . CHF (congestive heart failure)   . BBBB (bilateral bundle branch block)   . LBBB (left bundle branch block)   . Seizures 06/11/2012    new onset/notes (06/11/2012)  . SOB (shortness of breath)     "sometimes when I lay down;; related to not taking my RX" (06/11/2012)  . Myocardial infarction     06/10/2012  . Atrial fibrillation   . Stroke   . Atrial thrombus     left  . Pulmonary HTN   . Atrial fibrillation 06/12/2012  . Coronary artery disease      Assessment: 81yo male w/ many hospital admissions c/o SOB x2d, on home milrinone gtt, CXR c/w vascular congestion and cardiomegaly, admitted for acute on chronic heart failure, to continue Coumadin for Afib; current INR remains therapeutic.  CBC low but stable, PLT wnl, no bleeding noted.  INR today 2.85  PTA dose: 2mg  TThSat, 4mg  AOD  Goal of Therapy:  INR 2-3   Plan:  Continue home dose 2mg  TThSat, 4mg  all other days Daily INR, Q72h CBC, s/sx of bleeding  Drucie Opitz, PharmD Clinical Pharmacy Resident Pager: [phone] 09/18/2014 11:27 AM

## 2014-09-21 ENCOUNTER — Inpatient Hospital Stay (HOSPITAL_COMMUNITY)
Admission: EM | Admit: 2014-09-21 | Discharge: 2014-09-29 | DRG: 291 | Disposition: A | Discharge status: Skilled Nursing Facility | Payer: Medicare Other | Attending: Cardiology | Admitting: Cardiology

## 2014-09-21 ENCOUNTER — Emergency Department (HOSPITAL_COMMUNITY): Payer: Medicare Other

## 2014-09-21 ENCOUNTER — Encounter (HOSPITAL_COMMUNITY): Payer: Self-pay | Admitting: *Deleted

## 2014-09-21 DIAGNOSIS — Z91128 Patient's intentional underdosing of medication regimen for other reason: Secondary | ICD-10-CM | POA: Diagnosis present

## 2014-09-21 DIAGNOSIS — I2510 Atherosclerotic heart disease of native coronary artery without angina pectoris: Secondary | ICD-10-CM | POA: Diagnosis present

## 2014-09-21 DIAGNOSIS — R630 Anorexia: Secondary | ICD-10-CM | POA: Diagnosis not present

## 2014-09-21 DIAGNOSIS — I5042 Chronic combined systolic (congestive) and diastolic (congestive) heart failure: Secondary | ICD-10-CM

## 2014-09-21 DIAGNOSIS — D72829 Elevated white blood cell count, unspecified: Secondary | ICD-10-CM | POA: Diagnosis present

## 2014-09-21 DIAGNOSIS — E780 Pure hypercholesterolemia: Secondary | ICD-10-CM | POA: Diagnosis present

## 2014-09-21 DIAGNOSIS — E875 Hyperkalemia: Secondary | ICD-10-CM | POA: Diagnosis present

## 2014-09-21 DIAGNOSIS — R64 Cachexia: Secondary | ICD-10-CM | POA: Diagnosis present

## 2014-09-21 DIAGNOSIS — Z66 Do not resuscitate: Secondary | ICD-10-CM | POA: Diagnosis present

## 2014-09-21 DIAGNOSIS — R079 Chest pain, unspecified: Secondary | ICD-10-CM | POA: Diagnosis not present

## 2014-09-21 DIAGNOSIS — Z95810 Presence of automatic (implantable) cardiac defibrillator: Secondary | ICD-10-CM

## 2014-09-21 DIAGNOSIS — Z7901 Long term (current) use of anticoagulants: Secondary | ICD-10-CM | POA: Diagnosis not present

## 2014-09-21 DIAGNOSIS — I252 Old myocardial infarction: Secondary | ICD-10-CM | POA: Diagnosis not present

## 2014-09-21 DIAGNOSIS — Z9114 Patient's other noncompliance with medication regimen: Secondary | ICD-10-CM | POA: Diagnosis present

## 2014-09-21 DIAGNOSIS — Z8673 Personal history of transient ischemic attack (TIA), and cerebral infarction without residual deficits: Secondary | ICD-10-CM | POA: Diagnosis not present

## 2014-09-21 DIAGNOSIS — I5023 Acute on chronic systolic (congestive) heart failure: Principal | ICD-10-CM | POA: Diagnosis present

## 2014-09-21 DIAGNOSIS — I428 Other cardiomyopathies: Secondary | ICD-10-CM | POA: Diagnosis present

## 2014-09-21 DIAGNOSIS — E876 Hypokalemia: Secondary | ICD-10-CM | POA: Diagnosis not present

## 2014-09-21 DIAGNOSIS — E871 Hypo-osmolality and hyponatremia: Secondary | ICD-10-CM | POA: Diagnosis present

## 2014-09-21 DIAGNOSIS — J9620 Acute and chronic respiratory failure, unspecified whether with hypoxia or hypercapnia: Secondary | ICD-10-CM | POA: Diagnosis present

## 2014-09-21 DIAGNOSIS — Z8249 Family history of ischemic heart disease and other diseases of the circulatory system: Secondary | ICD-10-CM | POA: Diagnosis not present

## 2014-09-21 DIAGNOSIS — I447 Left bundle-branch block, unspecified: Secondary | ICD-10-CM | POA: Diagnosis present

## 2014-09-21 DIAGNOSIS — I272 Other secondary pulmonary hypertension: Secondary | ICD-10-CM | POA: Diagnosis present

## 2014-09-21 DIAGNOSIS — I480 Paroxysmal atrial fibrillation: Secondary | ICD-10-CM | POA: Diagnosis present

## 2014-09-21 DIAGNOSIS — I129 Hypertensive chronic kidney disease with stage 1 through stage 4 chronic kidney disease, or unspecified chronic kidney disease: Secondary | ICD-10-CM | POA: Diagnosis present

## 2014-09-21 DIAGNOSIS — R0602 Shortness of breath: Secondary | ICD-10-CM

## 2014-09-21 DIAGNOSIS — T503X6A Underdosing of electrolytic, caloric and water-balance agents, initial encounter: Secondary | ICD-10-CM | POA: Diagnosis present

## 2014-09-21 DIAGNOSIS — Z79899 Other long term (current) drug therapy: Secondary | ICD-10-CM

## 2014-09-21 DIAGNOSIS — Z515 Encounter for palliative care: Secondary | ICD-10-CM | POA: Diagnosis not present

## 2014-09-21 DIAGNOSIS — J9811 Atelectasis: Secondary | ICD-10-CM | POA: Diagnosis present

## 2014-09-21 DIAGNOSIS — W19XXXA Unspecified fall, initial encounter: Secondary | ICD-10-CM | POA: Diagnosis not present

## 2014-09-21 DIAGNOSIS — N184 Chronic kidney disease, stage 4 (severe): Secondary | ICD-10-CM | POA: Diagnosis present

## 2014-09-21 DIAGNOSIS — F329 Major depressive disorder, single episode, unspecified: Secondary | ICD-10-CM | POA: Diagnosis present

## 2014-09-21 DIAGNOSIS — R0600 Dyspnea, unspecified: Secondary | ICD-10-CM | POA: Diagnosis present

## 2014-09-21 DIAGNOSIS — E785 Hyperlipidemia, unspecified: Secondary | ICD-10-CM | POA: Diagnosis present

## 2014-09-21 DIAGNOSIS — R1909 Other intra-abdominal and pelvic swelling, mass and lump: Secondary | ICD-10-CM | POA: Diagnosis present

## 2014-09-21 LAB — CBC WITH DIFFERENTIAL/PLATELET
BASOS PCT: 0 % (ref 0–1)
Basophils Absolute: 0 10*3/uL (ref 0.0–0.1)
EOS PCT: 0 % (ref 0–5)
Eosinophils Absolute: 0.1 10*3/uL (ref 0.0–0.7)
HCT: 34.9 % — ABNORMAL LOW (ref 39.0–52.0)
Hemoglobin: 11.2 g/dL — ABNORMAL LOW (ref 13.0–17.0)
Lymphocytes Relative: 8 % — ABNORMAL LOW (ref 12–46)
Lymphs Abs: 1.5 10*3/uL (ref 0.7–4.0)
MCH: 25.3 pg — ABNORMAL LOW (ref 26.0–34.0)
MCHC: 32.1 g/dL (ref 30.0–36.0)
MCV: 79 fL (ref 78.0–100.0)
MONOS PCT: 2 % — AB (ref 3–12)
Monocytes Absolute: 0.5 10*3/uL (ref 0.1–1.0)
Neutro Abs: 16.8 10*3/uL — ABNORMAL HIGH (ref 1.7–7.7)
Neutrophils Relative %: 90 % — ABNORMAL HIGH (ref 43–77)
Platelets: 375 10*3/uL (ref 150–400)
RBC: 4.42 MIL/uL (ref 4.22–5.81)
RDW: 18.1 % — ABNORMAL HIGH (ref 11.5–15.5)
WBC: 18.8 10*3/uL — ABNORMAL HIGH (ref 4.0–10.5)

## 2014-09-21 LAB — URINALYSIS, ROUTINE W REFLEX MICROSCOPIC
Bilirubin Urine: NEGATIVE
Glucose, UA: NEGATIVE mg/dL
Hgb urine dipstick: NEGATIVE
KETONES UR: NEGATIVE mg/dL
Leukocytes, UA: NEGATIVE
Nitrite: NEGATIVE
PROTEIN: NEGATIVE mg/dL
Specific Gravity, Urine: 1.008 (ref 1.005–1.030)
Urobilinogen, UA: 0.2 mg/dL (ref 0.0–1.0)
pH: 7 (ref 5.0–8.0)

## 2014-09-21 LAB — BASIC METABOLIC PANEL
ANION GAP: 15 (ref 5–15)
BUN: 85 mg/dL — ABNORMAL HIGH (ref 6–23)
CHLORIDE: 85 mmol/L — AB (ref 96–112)
CO2: 22 mmol/L (ref 19–32)
CREATININE: 2.12 mg/dL — AB (ref 0.50–1.35)
Calcium: 9.2 mg/dL (ref 8.4–10.5)
GFR calc Af Amer: 32 mL/min — ABNORMAL LOW (ref >90)
GFR calc non Af Amer: 28 mL/min — ABNORMAL LOW (ref >90)
Glucose, Bld: 198 mg/dL — ABNORMAL HIGH (ref 70–99)
Potassium: 5.3 mmol/L — ABNORMAL HIGH (ref 3.5–5.1)
SODIUM: 122 mmol/L — AB (ref 135–145)

## 2014-09-21 LAB — PROTIME-INR
INR: 2.9 — ABNORMAL HIGH (ref 0.00–1.49)
Prothrombin Time: 30.6 seconds — ABNORMAL HIGH (ref 11.6–15.2)

## 2014-09-21 LAB — BRAIN NATRIURETIC PEPTIDE: B NATRIURETIC PEPTIDE 5: 2515.7 pg/mL — AB (ref 0.0–100.0)

## 2014-09-21 LAB — TROPONIN I
TROPONIN I: 0.05 ng/mL — AB (ref <0.031)
Troponin I: 0.05 ng/mL — ABNORMAL HIGH (ref <0.031)

## 2014-09-21 MED ORDER — WARFARIN - PHARMACIST DOSING INPATIENT
Freq: Every day | Status: DC
  Administered 2014-09-21 – 2014-09-24 (×4)
  Administered 2014-09-25: 1
  Administered 2014-09-26: 20:00:00
  Administered 2014-09-27: 1
  Administered 2014-09-28: 17:00:00

## 2014-09-21 MED ORDER — FENTANYL CITRATE 0.05 MG/ML IJ SOLN
25.0000 ug | Freq: Once | INTRAMUSCULAR | Status: DC
Start: 2014-09-21 — End: 2014-09-29
  Filled 2014-09-21: qty 2

## 2014-09-21 MED ORDER — SORBITOL 70 % SOLN
30.0000 mL | Freq: Every day | Status: DC
  Administered 2014-09-22 – 2014-09-29 (×8): 30 mL via ORAL
  Filled 2014-09-21 (×8): qty 30

## 2014-09-21 MED ORDER — ONDANSETRON HCL 4 MG/2ML IJ SOLN: 4.0000 mg | Freq: Four times a day (QID) | INTRAMUSCULAR | Status: DC | PRN

## 2014-09-21 MED ORDER — SODIUM CHLORIDE 0.9 % IJ SOLN
3.0000 mL | Freq: Two times a day (BID) | INTRAMUSCULAR | Status: DC
  Administered 2014-09-22 – 2014-09-28 (×9): 3 mL via INTRAVENOUS

## 2014-09-21 MED ORDER — SODIUM CHLORIDE 0.9 % IJ SOLN: 3.0000 mL | INTRAMUSCULAR | Status: DC | PRN

## 2014-09-21 MED ORDER — SILDENAFIL CITRATE 20 MG PO TABS
20.0000 mg | ORAL_TABLET | Freq: Three times a day (TID) | ORAL | Status: DC
  Administered 2014-09-21 – 2014-09-29 (×23): 20 mg via ORAL
  Filled 2014-09-21 (×25): qty 1

## 2014-09-21 MED ORDER — POLYETHYLENE GLYCOL 3350 17 G PO PACK
17.0000 g | PACK | Freq: Every day | ORAL | Status: DC
  Administered 2014-09-22 – 2014-09-29 (×8): 17 g via ORAL
  Filled 2014-09-21 (×8): qty 1

## 2014-09-21 MED ORDER — MILRINONE IN DEXTROSE 20 MG/100ML IV SOLN
0.5000 ug/kg/min | INTRAVENOUS | Status: DC
  Administered 2014-09-21 – 2014-09-29 (×17): 0.5 ug/kg/min via INTRAVENOUS
  Filled 2014-09-21 (×18): qty 100

## 2014-09-21 MED ORDER — WARFARIN SODIUM 2 MG PO TABS
2.0000 mg | ORAL_TABLET | Freq: Once | ORAL | Status: AC
  Administered 2014-09-21: 2 mg via ORAL
  Filled 2014-09-21: qty 1

## 2014-09-21 MED ORDER — METOCLOPRAMIDE HCL 10 MG PO TABS
10.0000 mg | ORAL_TABLET | Freq: Two times a day (BID) | ORAL | Status: DC
  Administered 2014-09-21 – 2014-09-29 (×16): 10 mg via ORAL
  Filled 2014-09-21 (×17): qty 1

## 2014-09-21 MED ORDER — TRAMADOL HCL 50 MG PO TABS: 50.0000 mg | ORAL_TABLET | Freq: Two times a day (BID) | ORAL | Status: DC | PRN

## 2014-09-21 MED ORDER — TRAMADOL HCL 50 MG PO TABS
50.0000 mg | ORAL_TABLET | Freq: Once | ORAL | Status: AC
  Administered 2014-09-21: 50 mg via ORAL
  Filled 2014-09-21: qty 1

## 2014-09-21 MED ORDER — ACETAMINOPHEN 325 MG PO TABS: 650.0000 mg | ORAL_TABLET | Freq: Four times a day (QID) | ORAL | Status: DC | PRN

## 2014-09-21 MED ORDER — MAGNESIUM OXIDE 400 (241.3 MG) MG PO TABS
400.0000 mg | ORAL_TABLET | Freq: Two times a day (BID) | ORAL | Status: DC
  Administered 2014-09-21 – 2014-09-29 (×16): 400 mg via ORAL
  Filled 2014-09-21 (×17): qty 1

## 2014-09-21 MED ORDER — LORAZEPAM 1 MG PO TABS
1.0000 mg | ORAL_TABLET | Freq: Three times a day (TID) | ORAL | Status: DC | PRN
  Administered 2014-09-21 – 2014-09-28 (×4): 1 mg via ORAL
  Filled 2014-09-21 (×4): qty 1

## 2014-09-21 MED ORDER — SODIUM CHLORIDE 0.9 % IV SOLN: 250.0000 mL | INTRAVENOUS | Status: DC | PRN

## 2014-09-21 MED ORDER — ASPIRIN 81 MG PO CHEW
324.0000 mg | CHEWABLE_TABLET | Freq: Once | ORAL | Status: AC
  Administered 2014-09-21: 324 mg via ORAL
  Filled 2014-09-21: qty 4

## 2014-09-21 MED ORDER — AMIODARONE HCL 200 MG PO TABS
200.0000 mg | ORAL_TABLET | Freq: Every day | ORAL | Status: DC
  Administered 2014-09-22 – 2014-09-29 (×8): 200 mg via ORAL
  Filled 2014-09-21 (×8): qty 1

## 2014-09-21 MED ORDER — FUROSEMIDE 10 MG/ML IJ SOLN
80.0000 mg | INTRAMUSCULAR | Status: DC
  Administered 2014-09-22: 80 mg via INTRAVENOUS
  Filled 2014-09-21: qty 8

## 2014-09-21 MED ORDER — TORSEMIDE 20 MG PO TABS
80.0000 mg | ORAL_TABLET | Freq: Two times a day (BID) | ORAL | Status: DC
  Administered 2014-09-21 – 2014-09-23 (×5): 80 mg via ORAL
  Filled 2014-09-21 (×8): qty 4

## 2014-09-21 NOTE — ED Provider Notes (Signed)
TIME SEEN: 4:45 AM  CHIEF COMPLAINT: Chest pain, shortness of breath  HPI: Pt is a 81 y.o. male with history of hypertension, hyperlipidemia, atrial fibrillation on chronic Coumadin, CHF with an ejection fraction of 20% status post pacemaker/AICD who is on a milrinone drip at 33.95 mcg/min who presents to the emergency department with diffuse chest pain that he is unable to describe and shortness of breath that started yesterday at 8 AM. Patient is speaking only, and spanish phone interpretor used. History is limited and patient is unable to further characterize his pain, Tommy aggravating or relieving factors. States that he feels similar to when he was admitted on 09/17/14 for CHF exacerbation. On 09/18/14 patient was discharged with a dry weight of 130 pounds. He is unsure what his weight is today. Denies fever or cough. Denies lower extremity swelling or pain.  On discharge patient was discharged home with instructions to continue his previous dose of 80 mg twice a day of torsemide, however replace Monday/Wednesday/Friday morning torsemide with his 80 mg IV Lasix.  During last admission patient refused placement in skilled nursing facility.  ROS: See HPI Constitutional: no fever  Eyes: no drainage  ENT: no runny nose   Cardiovascular:   chest pain  Resp:  SOB  GI: no vomiting GU: no dysuria Integumentary: no rash  Allergy: no hives  Musculoskeletal: no leg swelling  Neurological: no slurred speech ROS otherwise negative  PAST MEDICAL HISTORY/PAST SURGICAL HISTORY:  Past Medical History  Diagnosis Date  . HTN (hypertension)   . High cholesterol   . CHF (congestive heart failure)   . BBBB (bilateral bundle branch block)   . LBBB (left bundle branch block)   . Seizures 06/11/2012    new onset/notes (06/11/2012)  . SOB (shortness of breath)     "sometimes when I lay down;; related to not taking my RX" (06/11/2012)  . Myocardial infarction     06/10/2012  . Atrial fibrillation   .  Stroke   . Atrial thrombus     left  . Pulmonary HTN   . Atrial fibrillation 06/12/2012  . Coronary artery disease     MEDICATIONS:  Prior to Admission medications   Medication Sig Start Date End Date Taking? Authorizing Provider  acetaminophen (TYLENOL) 325 MG tablet Take 2 tablets (650 mg total) by mouth every 6 (six) hours as needed for mild pain (or Fever >/= 101). 07/10/14   Kinnie Feil, MD  amiodarone (PACERONE) 200 MG tablet Take 1 tablet (200 mg total) by mouth daily. 07/10/14   Kinnie Feil, MD  furosemide (LASIX) 10 MG/ML injection Inject 8 mLs (80 mg total) into the vein 3 (three) times a week. To replace Mon/Wed/Fri morning dose of torsemide with 80mg  IV lasix. 09/18/14   Almyra Deforest, PA  LORazepam (ATIVAN) 1 MG tablet Take 1 tablet (1 mg total) by mouth 3 (three) times daily as needed for anxiety. 4/40/10   Delora Fuel, MD  magnesium oxide (MAG-OX) 400 MG tablet Take 1 tablet (400 mg total) by mouth 2 (two) times daily. 04/25/14   Jolaine Artist, MD  metoCLOPramide (REGLAN) 10 MG tablet Take 1 tablet (10 mg total) by mouth 2 (two) times daily. 07/31/14   Jolaine Artist, MD  milrinone (PRIMACOR) 20 MG/100ML SOLN infusion Inject 33.95 mcg/min into the vein continuous. 09/18/14   Almyra Deforest, PA  polyethylene glycol (MIRALAX / GLYCOLAX) packet Take 17 g by mouth daily. 08/23/14   Isaiah Serge, NP  potassium  chloride SA (K-DUR,KLOR-CON) 20 MEQ tablet Take 2 tablets (40 mEq total) by mouth 2 (two) times daily. 09/18/14   Almyra Deforest, PA  sildenafil (REVATIO) 20 MG tablet Take 1 tablet (20 mg total) by mouth 3 (three) times daily. 09/18/14   Almyra Deforest, PA  sorbitol 70 % SOLN Take 30 mLs by mouth daily. 09/18/14   Almyra Deforest, PA  spironolactone (ALDACTONE) 25 MG tablet Take 1 tablet (25 mg total) by mouth 2 (two) times daily. 09/18/14   Almyra Deforest, PA  torsemide (DEMADEX) 20 MG tablet Take 4 tablets (80 mg total) by mouth 2 (two) times daily. Replace Monday Wednesday and Friday morning demadex  with 80mg  IV lasix. [facility] to manage 09/18/14   Almyra Deforest, PA  traMADol (ULTRAM) 50 MG tablet Take 1 tablet (50 mg total) by mouth 2 (two) times daily as needed. for pain Patient not taking: Reported on 09/15/2014 09/12/14   [name] D Ninfa Meeker, NP  warfarin (COUMADIN) 4 MG tablet Take 1 tablet (4 mg total) by mouth daily at 6 PM. Take 4 mg Monday, Wednesday, Friday and Sunday. 2 mg on Tues, Thurs and Sat 09/18/14   Almyra Deforest, PA    ALLERGIES:  Allergies  Allergen Reactions  . Lisinopril Rash    SOCIAL HISTORY:  History  Substance Use Topics  . Smoking status: Never Smoker   . Smokeless tobacco: Never Used  . Alcohol Use: No    FAMILY HISTORY: Family History  Problem Relation Age of Onset  . Heart attack      father had MI in his 100s, no fhx of early heart problem before age 80  . Heart attack      mother died of MI around 24    EXAM: BP 109/55 mmHg  Pulse 93  Temp(Src) 98.3 F (36.8 C)  Resp 28  SpO2 96% CONSTITUTIONAL: Alert and oriented and responds appropriately to questions. Elderly, chronically ill appearing, speaking short sentences, mild respiratory distress HEAD: Normocephalic EYES: Conjunctivae clear, PERRL ENT: normal nose; no rhinorrhea; moist mucous membranes; pharynx without lesions noted NECK: Supple, no meningismus, no LAD  CARD: RRR; S1 and S2 appreciated; no murmurs, no clicks, no rubs, no gallops RESP: Normal chest excursion without splinting, patient is tachypneic but his breath sounds are clear bilaterally with good aeration, no wheezing, rhonchi, no rales appreciated, no hypoxia but patient is speaking short sentences ABD/GI: Normal bowel sounds; non-distended; soft, non-tender, no rebound, no guarding BACK:  The back appears normal and is non-tender to palpation, there is no CVA tenderness EXT: Normal ROM in all joints; non-tender to palpation; no edema; normal capillary refill; no cyanosis, no calf tenderness or swelling    SKIN: Normal color for age and race;  warm NEURO: Moves all extremities equally PSYCH: The patient's mood and manner are appropriate. Grooming and personal hygiene are appropriate.  MEDICAL DECISION MAKING: Patient here with chest pain and shortness of breath. Has significant cardiac history including EF of 20% on milrinone drip. Labs show leukocytosis of 18.8 with left shift these afebrile. Troponin mildly elevated at 0.05 but this is in the setting of chronic kidney disease with creatinine of 2.12. INR therapeutic at 2.9. Chest x-ray shows stable cardiomegaly and palmar vascular congestion with bibasilar atelectasis. BNP is elevated at 2500. We will weigh patient. Given aspirin. Will give fennel for pain. He has slightly low blood pressures which is common for him therefore will avoid nitroglycerin at this time. We'll discuss with cardiology on call for further recommendations.  He is significantly hyponatremic.  ED PROGRESS: 7:00 AM  D/w Dr. Skeet Latch, cardiology fellow. She will sign the patient out to the morning cardiology team who will see the patient in consult.  Awaiting cardiology disposition.      EKG Interpretation  Date/Time:  Sunday September 21 2014 79:72:82 EDT Ventricular Rate:  89 PR Interval:  144 QRS Duration: 206 QT Interval:  478 QTC Calculation: 581 R Axis:   -148 Text Interpretation:  Atrial-sensed ventricular-paced rhythm Biventricular pacemaker detected Abnormal ECG Confirmed by Claud Gowan,  DO, Moya Duan ([id number]) on 09/21/2014 4:31:57 AM        [place], DO 09/21/14 [id number]

## 2014-09-21 NOTE — ED Notes (Signed)
The pt has had chest pain since yesterday with some sob in the am

## 2014-09-21 NOTE — Progress Notes (Signed)
[facility]  Patient Status: Active pt with [facility] prior to this readmission  [facility] is providing the following services: [facility] for home Milrinone.  [facility] will follow pt while in patient and support DC plan as ordered.    If patient discharges after hours, please call [phone].   Tyler Christensen 09/21/2014, 10:11 PM

## 2014-09-21 NOTE — Progress Notes (Signed)
[place] for Coumadin Indication: atrial fibrillation  Allergies  Allergen Reactions  . Lisinopril Rash     Vital Signs: Temp: 97.5 F (36.4 C) (04/03 1507) Temp Source: Oral (04/03 1507) BP: 92/56 mmHg (04/03 1507) Pulse Rate: 74 (04/03 1507)  Labs:  Recent Labs  09/21/14 0220  HGB 11.2*  HCT 34.9*  PLT 375  LABPROT 30.6*  INR 2.90*  CREATININE 2.12*  TROPONINI 0.05*    Estimated Creatinine Clearance: 24.9 mL/min (by C-G formula based on Cr of 2.12).   Medical History: Past Medical History  Diagnosis Date  . HTN (hypertension)   . High cholesterol   . CHF (congestive heart failure)   . BBBB (bilateral bundle branch block)   . LBBB (left bundle branch block)   . Seizures 06/11/2012    new onset/notes (06/11/2012)  . SOB (shortness of breath)     "sometimes when I lay down;; related to not taking my RX" (06/11/2012)  . Myocardial infarction     06/10/2012  . Atrial fibrillation   . Stroke   . Atrial thrombus     left  . Pulmonary HTN   . Atrial fibrillation 06/12/2012  . 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 INR at goal - 2.9. Will continue home regimen.  PTA dose: 2mg  TThSat, 4mg  AOD  Goal of Therapy:  INR 2-3   Plan:  Warfarin 2mg  tonight Daily INR  Erin Hearing PharmD., BCPS Clinical Pharmacist Pager [phone] 09/21/2014 4:11 PM

## 2014-09-21 NOTE — H&P (Signed)
Patient ID: Tyler Christensen MRN: [id number], DOB/AGE: Jun 13, 1933   Admit date: 09/21/2014   Primary Physician: Harvie Junior, MD Primary Cardiologist: Dr. Haroldine Laws  Pt. 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 HTN (hypertension)   . High cholesterol   . CHF (congestive heart failure)   . BBBB (bilateral bundle branch block)   . LBBB (left bundle branch block)   . Seizures 06/11/2012    new onset/notes (06/11/2012)  . SOB (shortness of breath)     "sometimes when I lay down;; related to not taking my RX" (06/11/2012)  . Myocardial infarction     06/10/2012  . Atrial fibrillation   . Stroke   . Atrial thrombus     left  . Pulmonary HTN   . Atrial fibrillation 06/12/2012  . Coronary artery disease     Past Surgical History  Procedure Laterality Date  . Throat surgery  1942    "and nose" (06/11/2012); ?T&A  . Inguinal hernia repair  ~ 2008    "both sides" (06/11/2012)  . Tee without cardioversion  06/29/2012    Procedure: TRANSESOPHAGEAL ECHOCARDIOGRAM (TEE);  Surgeon: Jolaine Artist, MD;  Location: [facility] ENDOSCOPY;   . Cardioversion N/A 08/14/2012    Procedure: CARDIOVERSION;  Surgeon: Jolaine Artist, MD;  Location: [facility] ENDOSCOPY;  . Tee without cardioversion N/A 11/12/2013    Procedure: TRANSESOPHAGEAL ECHOCARDIOGRAM (TEE);  Surgeon: Candee Furbish, MD;  Location: [facility] ENDOSCOPY;   . Tee without cardioversion N/A 11/19/2013    Procedure: TRANSESOPHAGEAL ECHOCARDIOGRAM (TEE);  Surgeon: Jolaine Artist, MD;  Location: [facility] ENDOSCOPY;   . Cardioversion N/A 12/27/2013    Procedure: CARDIOVERSION;  Surgeon: Jolaine Artist, MD;  Location: [facility] ENDOSCOPY;   . Left and  right heart catheterization with coronary angiogram N/A 07/02/2012    Procedure: LEFT AND RIGHT HEART CATHETERIZATION WITH CORONARY ANGIOGRAM;  Surgeon: Jolaine Artist, MD;  Location: [facility] CATH LAB; Mild non-obstructive CAD, severe PAH, PA = 93/42 (59), severe NICM w/ EF 15%  . Right heart catheterization N/A 07/06/2012    Procedure: RIGHT HEART CATH;  Surgeon: Jolaine Artist, MD; to assess response to milrinone; Moderate PH with profound reduction in PVR with milrinone/PDE-5 inhibitor   . Bi-ventricular implantable cardioverter defibrillator N/A 10/26/2012    Procedure: BI-VENTRICULAR IMPLANTABLE CARDIOVERTER DEFIBRILLATOR  (CRT-D);  Surgeon: Evans Lance, MD;  Medtronic Evera XT CRTD, BiV ICD serial #[id number]       Allergies  Allergies  Allergen Reactions  . 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 He also has a DO NOT RESUSCITATE status. He had a right heart cath in 79/13/2013 which showed cardiac index 1.1, cardiac output 1.9, RV 94/79/13. He was placed on Revatio and the IV Primacor. Repeat right heart cath in 07/06/2012 showed significant improvement. He had a previous VQ scan showed low probability of pulmonary emboli. Attempts were made to wean off Primacor in 2014, however failed due to worsening heart failure and  pulmonary arterial hypertension. He was admitted in 2015 for staph aureus bacteremia, TEE at the time showed negative for endocarditis. He has been on systemic anticoagulation therapy since June 2015 when he found to have atrial fibrillation with significant left atrial clot burden. He has been on home oxygen 24 hours a day. During the last 6 month, patient has been admitted 8 times for heart failure. His milrinone has been increased to  0.47mcg/kg/min. his last echocardiogram on 07/07/2014 showed EF 15%, moderate hypokinesis of the inferior myocardium, moderate MR, moderate TR, PA peak pressure 85.   He was most recently admitted on 3/29 after previously discharged on 3/25 for heart failure. During the last admission, spironolactone was decreased from 50 mg twice a day to 25 mg twice a day. His torsemide was also decreased to 80 mg twice a day. Dr. Haroldine Laws has placed him on 80 mg IV Lasix to replace his torsemide for Monday/Wednesday/Friday morning dose which was managed by home nurse. On discharge, his weight was 130 pounds. The heart failure team has a long discussion with the patient during the last admission, at first he agreed to go to skilled nursing facility, however prior to discharge, he refused SNF. He was eventually discharged home with home health.  Unfortunately, patient is a poor historian, and cannot recall the dosage of the medication he has been taking since discharge. However he does states he has been compliant with all medications. He started having mild abdominal discomfort and chest pain on 09/20/2014. When asked if there is any exacerbating factor, patient states he is not sure. However he think deep inspiration and palpation increased the chest pain "a little bit". He also complaining of increasing shortness of breath. He states he has not slept for the past 6 days. On arrival his blood pressure was 109/55, however slowly trended down to 68/45. Significant laboratory finding include sodium 122, potassium 5.3, Cr 2.12, BNP 2515, troponin 0.05, white blood cell count 18.8, INR therapeutic at 2.9. Urinalysis was negative. Chest x-ray shows stable cardiomegaly with pulmonary vascular congestion, bilateral atelectasis. EKG showed atrial sensed and ventricularly paced rhythm, cardiology has been consulted for shortness of breath and chest pain.  Home Medications  Prior to Admission medications   Medication Sig Start Date  End Date Taking? Authorizing Provider  acetaminophen (TYLENOL) 325 MG tablet Take 2 tablets (650 mg total) by mouth every 6 (six) hours as needed for mild pain (or Fever >/= 101). 07/10/14   Kinnie Feil, MD  amiodarone (PACERONE) 200 MG tablet Take 1 tablet (200 mg total) by mouth daily. 07/10/14   Kinnie Feil, MD  furosemide (LASIX) 10 MG/ML injection Inject 8 mLs (80 mg total) into the vein 3 (three) times a week. To replace Mon/Wed/Fri morning dose of torsemide with 80mg  IV lasix. 09/18/14   Almyra Deforest, PA  LORazepam (ATIVAN) 1 MG tablet Take 1 tablet (1 mg total) by mouth 3 (three) times daily as needed for anxiety. 9/37/90   Delora Fuel, MD  magnesium oxide (MAG-OX) 400 MG tablet Take 1 tablet (400 mg total) by mouth 2 (two) times daily. Patient not taking: Reported on 09/21/2014 04/25/14   Jolaine Artist, MD  metoCLOPramide (REGLAN) 10 MG tablet Take 1 tablet (10 mg total) by mouth 2 (two) times daily. 07/31/14   Jolaine Artist, MD  milrinone (PRIMACOR) 20 MG/100ML SOLN infusion Inject 33.95 mcg/min into the vein continuous. 09/18/14   Almyra Deforest, PA  polyethylene glycol [facility] / Floria Raveling) packet Take  17 g by mouth daily. 08/23/14   Isaiah Serge, NP  potassium chloride SA (K-DUR,KLOR-CON) 20 MEQ tablet Take 2 tablets (40 mEq total) by mouth 2 (two) times daily. 09/18/14   Almyra Deforest, PA  sildenafil (REVATIO) 20 MG tablet Take 1 tablet (20 mg total) by mouth 3 (three) times daily. 09/18/14   Almyra Deforest, PA  sorbitol 70 % SOLN Take 30 mLs by mouth daily. 09/18/14   Almyra Deforest, PA  spironolactone (ALDACTONE) 25 MG tablet Take 1 tablet (25 mg total) by mouth 2 (two) times daily. 09/18/14   Almyra Deforest, PA  torsemide (DEMADEX) 20 MG tablet Take 4 tablets (80 mg total) by mouth 2 (two) times daily. Replace Monday Wednesday and Friday morning demadex with 80mg  IV lasix. AHC to manage 09/18/14   Almyra Deforest, PA  traMADol (ULTRAM) 50 MG tablet Take 1 tablet (50 mg total) by mouth 2 (two) times daily as needed.  for pain Patient not taking: Reported on 09/15/2014 09/12/14   [name] D Ninfa Meeker, NP  warfarin (COUMADIN) 4 MG tablet Take 1 tablet (4 mg total) by mouth daily at 6 PM. Take 4 mg Monday, Wednesday, Friday and Sunday. 2 mg on Cain Saupe and Sat 09/18/14   Almyra Deforest, PA    Family History  Family History  Problem Relation Age of Onset  . Heart attack      father had MI in his 34s, no fhx of early heart problem before age 47  . Heart attack      mother died of MI around 6    Social History  History   Social History  . Marital Status: Divorced    Spouse Name: N/A  . Number of Children: N/A  . Years of Education: N/A   Occupational History  . Retired    Social History Main Topics  . Smoking status: Never Smoker   . Smokeless tobacco: Never Used  . Alcohol Use: No  . Drug Use: No  . Sexual Activity: No   Other Topics Concern  . Not on file   Social History Narrative   [name], Spanish only (few words of English). Lives with wife.     Review of Systems General:  No chills, fever, night sweats or weight changes.  Cardiovascular:  No dyspnea on exertion, palpitation. +chest pain, edema Dermatological: No rash, lesions/masses Respiratory: No cough +dyspnea Urologic: No hematuria, dysuria Abdominal:   No nausea, vomiting, diarrhea, bright red blood per rectum, melena, or hematemesis Neurologic:  No visual changes, wkns, changes in mental status. All other systems reviewed and are otherwise negative except as noted above.  Physical Exam  Blood pressure 90/46, pulse 70, temperature 98.3 F (36.8 C), resp. rate 26, SpO2 93 %.  General: Pleasant, NAD Psych: Normal affect. Neuro: Alert and oriented X 3. Moves all extremities spontaneously. HEENT: Normal  Neck: Supple without bruits +JVD. Lungs:  Resp regular and unlabored. +decreased bibasilar breath sound. Heart: RRR no s3, s4, or murmurs. Abdomen: Soft, non-tender, non-distended, BS + x 4.  Extremities: No clubbing,  cyanosis. DP/PT/Radials 2+ and equal bilaterally. 1+ pitting edema in RLE, no edema in L  Labs  Troponin (Point of Care Test) No results for input(s): TROPIPOC in the last 72 hours.  Recent Labs  09/21/14 0220  TROPONINI 0.05*   Lab Results  Component Value Date   WBC 18.8* 09/21/2014   HGB 11.2* 09/21/2014   HCT 34.9* 09/21/2014   MCV 79.0 09/21/2014   PLT 375 09/21/2014  Recent Labs Lab 09/16/14 0250  09/21/14 0220  NA 125*  < > 122*  K 5.2*  < > 5.3*  CL 89*  < > 85*  CO2 21  < > 22  BUN 79*  < > 85*  CREATININE 2.20*  < > 2.12*  CALCIUM 9.2  < > 9.2  PROT 7.2  --   --   BILITOT 0.9  --   --   ALKPHOS 223*  --   --   ALT 42  --   --   AST 35  --   --   GLUCOSE 183*  < > 198*  < > = values in this interval not displayed. Lab Results  Component Value Date   CHOL 128 06/12/2012   HDL 27* 06/12/2012   LDLCALC 78 06/12/2012   TRIG 113 06/12/2012   Lab Results  Component Value Date   DDIMER 0.59* 06/05/2014     Radiology/Studies  Dg Chest 2 View  09/21/2014   CLINICAL DATA:  Chest pain and shortness of breath beginning yesterday. History of cardiac catheterization.  EXAM: CHEST  2 VIEW  COMPARISON:  Chest radiograph September 16, 2014  FINDINGS: The cardiac silhouette is moderately enlarged, unchanged. Similar pulmonary vascular congestion. Calcified aortic knob. Bibasilar strandy densities with mildly elevated RIGHT hemidiaphragm. No floor pleural effusion or focal consolidation. No pneumothorax.  Tunneled hit vein catheter via RIGHT internal jugular central venous approach with distal tip projecting in distal superior vena cava. LEFT cardiac defibrillator in situ. Patient is osteopenic.  IMPRESSION: Stable cardiomegaly and pulmonary vascular congestion. Bibasilar atelectasis.   Electronically Signed   By: Elon Alas   On: 09/21/2014 06:38   Dg Chest 2 View  09/16/2014   CLINICAL DATA:  Acute onset of shortness breath and leg swelling. Initial encounter.   EXAM: CHEST  2 VIEW  COMPARISON:  Chest radiograph performed 09/15/2014  FINDINGS: The lungs are well-aerated. Vascular congestion is noted. Mild bibasilar opacities appear mildly worsened, raising concern for mildly worsened interstitial edema. No definite pleural effusion or pneumothorax is seen.  The heart is enlarged. A pacemaker/AICD is noted at the left chest wall, with leads ending at the right atrium, right ventricle and coronary sinus. A right-sided catheter is noted ending within the proximal right atrium. No acute osseous abnormalities are seen.  IMPRESSION: Vascular congestion and cardiomegaly. Mild bibasilar airspace opacities appear mildly worsened, raising concern for mildly worsened interstitial edema.   Electronically Signed   By: Garald Balding M.D.   On: 09/16/2014 03:32   Dg Chest 2 View  09/15/2014   CLINICAL DATA:  Increasing shortness of breath.  Chest pain.  EXAM: CHEST  2 VIEW  COMPARISON:  08/31/2014  FINDINGS: Multi lead left-sided pacemaker remains in place. Tip of the right central line in the atrial caval junction. Cardiomegaly is unchanged. Pulmonary edema is unchanged. Trace bilateral pleural effusions. No confluent airspace disease. No pneumothorax.  IMPRESSION: Cardiomegaly and pulmonary edema, unchanged from prior exam. There are trace bilateral pleural effusions.   Electronically Signed   By: Jeb Levering M.D.   On: 09/15/2014 02:30   Dg Chest Portable 1 View  08/31/2014   CLINICAL DATA:  Chest pain  EXAM: PORTABLE CHEST - 1 VIEW  COMPARISON:  08/29/2014  FINDINGS: Right jugular central line extends to the right atrium. There are intact appearances of the transvenous leads. There is unchanged cardiomegaly and aortic tortuosity. There is partial clearance of alveolar edema compared to the prior study.  IMPRESSION: Improved, with partial clearance of edema since 08/29/2014. Unchanged cardiomegaly.   Electronically Signed   By: Andreas Newport M.D.   On: 08/31/2014 23:14     Dg Chest Port 1 View  08/29/2014   CLINICAL DATA:  Chest pain.  EXAM: PORTABLE CHEST - 1 VIEW  COMPARISON:  08/19/2014  FINDINGS: Dual lead left-sided pacemaker remains in place. Tip of the right central line in the SVC. Cardiomegaly is unchanged. Pulmonary edema is mildly progressed. No large pleural effusion or pneumothorax. Unchanged osseous structures.  IMPRESSION: Progressive pulmonary edema from prior.  Cardiomegaly is unchanged.   Electronically Signed   By: Jeb Levering M.D.   On: 08/29/2014 04:53   Dg Abd Portable 1v  09/02/2014   CLINICAL DATA:  Acute abdominal pain.  EXAM: PORTABLE ABDOMEN - 1 VIEW  COMPARISON:  Abdomen and pelvis CT obtained on 07/05/2014.  FINDINGS: Borderline dilated small bowel loops. No gross free peritoneal air. Lumbar and lower thoracic spine degenerative changes. Cardiac pacer and AICD leads.  IMPRESSION: Minimal small bowel ileus or partial obstruction.   Electronically Signed   By: Claudie Revering M.D.   On: 09/02/2014 11:15    ECG  AV paced rhythm  Echocardiogram 07/07/2014  LV EF: 15%  ------------------------------------------------------------------- Indications:   CHF - 428.0.  ------------------------------------------------------------------- History:  PMH:  Atrial fibrillation. Non-ischemic cardiomyopathy. Risk factors: Hypertension. Dyslipidemia.  ------------------------------------------------------------------- Study Conclusions  - Left ventricle: The cavity size was moderately dilated. Wall thickness was normal. Systolic function was severely reduced. The estimated ejection fraction was 15%. There is moderate hypokinesis of the inferior myocardium. The study is not technically sufficient to allow evaluation of LV diastolic function. - Aortic valve: There was trivial regurgitation. - Mitral valve: There was moderate regurgitation. - Left atrium: The atrium was mildly dilated. - Right ventricle: The cavity size  was mildly dilated. Systolic function was mildly reduced. - Right atrium: The atrium was mildly dilated. - Tricuspid valve: There was moderate regurgitation. - Pulmonary arteries: PA peak pressure: 85 mm Hg (S).    ASSESSMENT AND PLAN  1. Chest pain  - unclear etiology, some atypical feature.   - given his multiple medical issue, would not attempt any invasive testing unless clear indication.   2. Chronic combined systolic and diastolic heart failure with baseline EF 15%  - refractory, has multiple admission in the last 6 month, primacor dependent since 2013  - he likely would qualify for hospice, however patient previous refused palliative care  - not convince how wet he is, he has chronic JVD, LE edema only on dependent side. - will discuss with MD   3. Abdominal discomfort: unclear etiology  4. Hyperkalemia  - hold KCl supplement  5. NICM s/p Medtronic BiV-ICD 10/26/12 6. chronic LBBB 7. PAF on coumadin 8. HTN 9. HLD 10. CVD 11. severe pulm HTN on Rivatio  12. h/o seizure  Signed, Almyra Deforest, PA-C 09/21/2014, 10:32 AM As above, patient seen and examined. Briefly he is a 81 year old male with end-stage systolic congestive heart failure, nonischemic cardiomyopathy, chronic renal insufficiency, history of paroxysmal atrial fibrillation, severe pulmonary hypertension who presents with complaints of dyspnea, vague chest pain and vague abdominal pain. Patient is on chronic home milrinone. History is difficult as he does not speak [place]. It is obtained with the help of an interpreter. He has mild dyspnea and some chest pain increased with inspiration. Mild midabdominal pain. No rebound or guarding on examination. He is more prerenal in reviewing his laboratories and sodium  is lower. White blood cell count 18,000 but not febrile. Will admit and continue home milrinone dose. Continue preadmission dose of Demadex. Hold spironolactone. Hold potassium. Recheck potassium and renal function  tomorrow morning. Patient will need to be followed by advanced heart failure team. Options appear to be limited. It appears that palliative care/hospice would be most appropriate. Kirk Ruths

## 2014-09-22 DIAGNOSIS — I5023 Acute on chronic systolic (congestive) heart failure: Principal | ICD-10-CM

## 2014-09-22 LAB — BASIC METABOLIC PANEL
ANION GAP: 13 (ref 5–15)
BUN: 78 mg/dL — ABNORMAL HIGH (ref 6–23)
CHLORIDE: 83 mmol/L — AB (ref 96–112)
CO2: 26 mmol/L (ref 19–32)
CREATININE: 1.78 mg/dL — AB (ref 0.50–1.35)
Calcium: 9 mg/dL (ref 8.4–10.5)
GFR, EST AFRICAN AMERICAN: 40 mL/min — AB (ref >90)
GFR, EST NON AFRICAN AMERICAN: 35 mL/min — AB (ref >90)
Glucose, Bld: 137 mg/dL — ABNORMAL HIGH (ref 70–99)
POTASSIUM: 3.6 mmol/L (ref 3.5–5.1)
Sodium: 122 mmol/L — ABNORMAL LOW (ref 135–145)

## 2014-09-22 LAB — URINE CULTURE

## 2014-09-22 LAB — TROPONIN I
TROPONIN I: 0.06 ng/mL — AB (ref <0.031)
Troponin I: 0.04 ng/mL — ABNORMAL HIGH (ref <0.031)

## 2014-09-22 LAB — PROTIME-INR
INR: 2.9 — ABNORMAL HIGH (ref 0.00–1.49)
Prothrombin Time: 30.5 seconds — ABNORMAL HIGH (ref 11.6–15.2)

## 2014-09-22 MED ORDER — SPIRONOLACTONE 25 MG PO TABS
25.0000 mg | ORAL_TABLET | Freq: Two times a day (BID) | ORAL | Status: DC
  Administered 2014-09-22 – 2014-09-29 (×14): 25 mg via ORAL
  Filled 2014-09-22 (×18): qty 1

## 2014-09-22 MED ORDER — SODIUM CHLORIDE 0.9 % IJ SOLN
10.0000 mL | Freq: Two times a day (BID) | INTRAMUSCULAR | Status: DC
Start: 2014-09-22 — End: 2014-09-29
  Administered 2014-09-22 – 2014-09-28 (×6): 10 mL

## 2014-09-22 MED ORDER — WARFARIN SODIUM 4 MG PO TABS
4.0000 mg | ORAL_TABLET | Freq: Once | ORAL | Status: AC
  Administered 2014-09-22: 4 mg via ORAL
  Filled 2014-09-22: qty 1

## 2014-09-22 MED ORDER — SODIUM CHLORIDE 0.9 % IJ SOLN
10.0000 mL | INTRAMUSCULAR | Status: DC | PRN
  Administered 2014-09-22 – 2014-09-23 (×2): 10 mL
  Administered 2014-09-23: 20 mL
  Administered 2014-09-24 – 2014-09-29 (×5): 10 mL
  Filled 2014-09-22 (×8): qty 40

## 2014-09-22 NOTE — Progress Notes (Signed)
ANTICOAGULATION CONSULT NOTE - Follow Up Consult  Pharmacy Consult for Coumadin Indication: atrial fibrillation  Allergies  Allergen Reactions  . Lisinopril Rash    Patient Measurements: Height: 5\' 6"  (167.6 cm) (estimated. no speak english) Weight: 133 lb 9.6 oz (60.601 kg) (Scale B) IBW/kg (Calculated) : 63.8  Vital Signs: Temp: 97.2 F (36.2 C) (04/04 0534) Temp Source: Oral (04/04 0534) BP: 83/58 mmHg (04/04 0534) Pulse Rate: 70 (04/04 0534)  Labs:  Recent Labs  09/21/14 0220 09/21/14 1916 09/22/14 0107 09/22/14 0602  HGB 11.2*  --   --   --   HCT 34.9*  --   --   --   PLT 375  --   --   --   LABPROT 30.6*  --   --  30.5*  INR 2.90*  --   --  2.90*  CREATININE 2.12*  --   --  1.78*  TROPONINI 0.05* 0.05* 0.06* 0.04*    Estimated Creatinine Clearance: 28.8 mL/min (by C-G formula based on Cr of 1.78).  Assessment: 81yom continues on coumadin for afib. INR is therapeutic at 2.9. No bleeding reported. Continues on amiodarone as PTA.   Home dose: 4mg  MWFSun, 2mg  TTSat  Goal of Therapy:  INR 2-3 Monitor platelets by anticoagulation protocol: Yes   Plan:  1) Coumadin 4mg  x 1 tonight per home regimen 2) INR in AM  Deboraha Sprang 09/22/2014,12:53 PM

## 2014-09-22 NOTE — Progress Notes (Signed)
Pt [name] was 77/44, not complaining of any pain, denies nausea and vomiting, not in respiratory distress. Dr. Ermalene Searing was notified and no new orders given at this time. Will recheck BP in an hour   09/22/14 2032  Vitals  Temp 97.6 F (36.4 C)  Temp Source Oral  BP (!) 77/44 mmHg  BP Location Left Arm  BP Method Automatic  Patient Position (if appropriate) Lying  Pulse Rate 70  Pulse Rate Source Dinamap  Resp 16  Oxygen Therapy  SpO2 100 %  O2 Device Nasal Cannula  O2 Flow Rate (L/min) 2 L/min

## 2014-09-22 NOTE — Care Management Note (Signed)
    Page 1 of 1   09/29/2014     4:37:37 PM CARE MANAGEMENT NOTE 09/29/2014  Patient:  Tyler Christensen, Tyler Christensen   Account Number:  [id number]  Date Initiated:  09/22/2014  Documentation initiated by:  Ilithyia Titzer  Subjective/Objective Assessment:   Pt adm on 09/21/14 with dyspnea, CP.  PTA, pt resides at home with family.  He is active with [facility] for home milrinone drip and CHF management.     Action/Plan:   Pt refused SNF placement last two previous admissions, but now states he will go this time.  CSW consulted to facilitate dc to SNF, if pt is agreeable.   Anticipated DC Date:  09/29/2014   Anticipated DC Plan:  SKILLED NURSING FACILITY  In-house referral  Clinical Social Worker      DC Planning Services  CM consult      Choice offered to / List presented to:             Status of service:  Completed, signed off Medicare Important Message given?  YES (If response is "NO", the following Medicare IM given date fields will be blank) Date Medicare IM given:  09/24/2014 Medicare IM given by:  Yuka Lallier Date Additional Medicare IM given:  09/29/2014 Additional Medicare IM given by:  Rhylen Pulido  Discharge Disposition:  [place]  Per UR Regulation:  Reviewed for med. necessity/level of care/duration of stay  If discussed at [place] of Stay Meetings, dates discussed:   09/25/2014    Comments:  09/29/14 Ellan Lambert, RN, BSN [phone] Pt discharged to SNF today, per CSW arrangements. Milrinone drip hooked up to portable [facility] pump by [facility] rep prior to dc.

## 2014-09-22 NOTE — Progress Notes (Signed)
Interpreter Lesle Chris for Cardiologist DR Jeannetta Nap

## 2014-09-22 NOTE — Progress Notes (Signed)
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   Recently discharged on IV lasix 3 times a week M-W-F from [facility]. Creatinine elevated on admit so spiro and torsemide held. Weight down 4 pounds.   Denies SOB. Denies abdominal pain.   WBC 18.8   Objective:   Weight Range:  Vital Signs:   Temp:  [97.2 F (36.2 C)-97.7 F (36.5 C)] 97.2 F (36.2 C) (04/04 0534) Pulse Rate:  [67-80] 70 (04/04 0534) Resp:  [17-18] 18 (04/04 0534) BP: (83-102)/(46-68) 83/58 mmHg (04/04 0534) SpO2:  [91 %-100 %] 100 % (04/04 0534) Weight:  [133 lb 9.6 oz (60.601 kg)-137 lb 1.6 oz (62.188 kg)] 133 lb 9.6 oz (60.601 kg) (04/04 0534)    Weight change: Filed Weights   09/21/14 1507 09/22/14 0534  Weight: 137 lb 1.6 oz (62.188 kg) 133 lb 9.6 oz (60.601 kg)    Intake/Output:   Intake/Output Summary (Last 24 hours) at 09/22/14 0920 Last data filed at 09/22/14 0600  Gross per 24 hour  Intake    827 ml  Output   1101 ml  Net   -274 ml     Physical Exam: General: Elderly. Lying in bed.NAD. HEENT: normal Neck: supple. JVP 7-8 Carotids 2+ bilaterally; no bruits. No lymphadenopathy or thryomegaly appreciated.  Cor: PMI laterally displaced. Regular rhythm and rate. + s3  Lungs: clear Abdomen: soft, nontender, non distended. No hepatosplenomegaly. No bruits or masses. + bowel sounds. Extremities: no cyanosis, clubbing, rash. No edema Neuro: alert & orientedx3, cranial nerves grossly intact. Moves all 4 extremities w/o difficulty. Affect pleasant.  Telemetry:  AV paced   Labs: Basic Metabolic Panel:  Recent Labs Lab 09/16/14 0250 09/17/14 0306 09/18/14 0445  09/21/14 0220 09/22/14 0602  NA 125* 127* 124* 122* 122*  K 5.2* 3.7 3.0* 5.3* 3.6  CL 89* 88* 82* 85* 83*  CO2 21 28 30 22 26   GLUCOSE 183* 122* 160* 198* 137*  BUN 79* 73* 66* 85* 78*  CREATININE 2.20* 1.80* 1.66* 2.12* 1.78*  CALCIUM 9.2 8.9 8.8 9.2 9.0    Liver Function Tests:  Recent Labs Lab 09/16/14 0250  AST 35  ALT 42  ALKPHOS 223*  BILITOT 0.9  PROT 7.2  ALBUMIN 3.4*   No results for input(s): LIPASE, AMYLASE in the last 168 hours. No results for input(s): AMMONIA in the last 168 hours.  CBC:  Recent Labs Lab 09/16/14 0250 09/21/14 0220  WBC 14.4* 18.8*  NEUTROABS 12.3* 16.8*  HGB 10.6* 11.2*  HCT 32.8* 34.9*  MCV 79.8 79.0  PLT 375 375    Cardiac Enzymes:  Recent Labs Lab 09/21/14 0220 09/21/14 1916 09/22/14 0107 09/22/14 0602  TROPONINI 0.05* 0.05* 0.06* 0.04*    BNP: BNP (last 3 results)  Recent Labs  09/15/14 0209 09/16/14 0250 09/21/14 0220  BNP 2970.9* 3495.2* 2515.7*    ProBNP (last 3 results)  Recent Labs  03/01/14 1959 03/08/14 2230 06/05/14 0332  PROBNP 11880.0* 15334.0* 19533.0*      Other results:  Imaging: Dg Chest 2 View  09/21/2014   CLINICAL DATA:  Chest pain and shortness of breath beginning yesterday. History of cardiac catheterization.  EXAM: CHEST  2  VIEW  COMPARISON:  Chest radiograph September 16, 2014  FINDINGS: The cardiac silhouette is moderately enlarged, unchanged. Similar pulmonary vascular congestion. Calcified aortic knob. Bibasilar strandy densities with mildly elevated RIGHT hemidiaphragm. No floor pleural effusion or focal consolidation. No pneumothorax.  Tunneled hit vein catheter via RIGHT internal jugular central venous approach with distal tip projecting in distal superior vena cava. LEFT cardiac defibrillator in situ. Patient is osteopenic.  IMPRESSION: Stable cardiomegaly and pulmonary vascular congestion. Bibasilar atelectasis.   Electronically Signed   By: Elon Alas   On: 09/21/2014  06:38      Medications:     Scheduled Medications: . amiodarone  200 mg Oral Daily  . fentaNYL  25 mcg Intravenous Once  . furosemide  80 mg Intravenous Once per day on Mon Wed Fri  . magnesium oxide  400 mg Oral BID  . metoCLOPramide  10 mg Oral BID  . polyethylene glycol  17 g Oral Daily  . sildenafil  20 mg Oral TID  . sodium chloride  10-40 mL Intracatheter Q12H  . sodium chloride  3 mL Intravenous Q12H  . sorbitol  30 mL Oral Daily  . torsemide  80 mg Oral BID  . Warfarin - Pharmacist Dosing Inpatient   Does not apply q1800     Infusions: . milrinone 0.5 mcg/kg/min (09/22/14 0233)     PRN Medications:  sodium chloride, acetaminophen, LORazepam, ondansetron (ZOFRAN) IV, sodium chloride, sodium chloride, traMADol   Assessment   1.  Acute on chronic systolic HF (EF 89%) on home milrinone 2. Acute on CKD, stage III-IV 3. Hyperkalemia 4. PAF in NSR on amio 5. Retroperitoneal mass. 6. hyponatremia 7. PAH 8. Hypocalcemia 9. Acute on chronic respiratory failure 10. DNR 11. Elevated WBC    Plan:   Lengthy discussion regarding disposition with interpreter, Hedda Slade, and his wife and Mr Homann. Mr Neeson is  again requesting SNF at [facility] and his wife is agreeable. Place consult to SW for [facility].   Volume status ok off diuretics but need to restart today. Continue torsemide to 80 mg twice a day and spiro 25 mg twice a day.  Continue milrinone 0.5 mcg. Check BMET in am. Check CO-OX now.   WBC going up. Not on steroids. Afebrile. UA ok. Get blood cultures. If elevated will start vancomycin and replace PICC. If WBC ok and no fever tomorrow can discharge to [facility].   Maintaining SR. Continue amiodarone 200 mg daily. INR 2.9. Pharmacy addressing INR.   Consult palliative care again for goals of care as we have no options for ongoing end stage heart failure.   Length of Stay: 1   CLEGG,[name]  NP-C   09/22/2014, 9:20 AM  Advanced Heart Failure  Team Pager [phone] (M-F; [place])  Please contact [facility] for night-coverage after hours (4p -7a ) and weekends on [email or web address]  Patient seen with NP, agree with the above note.  He is very weak.  WBCs high but UA ok and CXR ok.  Sending blood cultures, will follow for now. No fever.  Restart home torsemide and spironolactone.  He will go to [facility] tomorrow if cultures negative and remains afebrile.   [name] 09/22/2014 12:17 PM

## 2014-09-22 NOTE — Progress Notes (Signed)
Interpreter Lesle Chris for Darrick Grinder NP

## 2014-09-23 DIAGNOSIS — Z515 Encounter for palliative care: Secondary | ICD-10-CM

## 2014-09-23 DIAGNOSIS — E871 Hypo-osmolality and hyponatremia: Secondary | ICD-10-CM

## 2014-09-23 DIAGNOSIS — R630 Anorexia: Secondary | ICD-10-CM

## 2014-09-23 LAB — BASIC METABOLIC PANEL
Anion gap: 13 (ref 5–15)
BUN: 75 mg/dL — ABNORMAL HIGH (ref 6–23)
CHLORIDE: 77 mmol/L — AB (ref 96–112)
CO2: 27 mmol/L (ref 19–32)
Calcium: 9.2 mg/dL (ref 8.4–10.5)
Creatinine, Ser: 1.76 mg/dL — ABNORMAL HIGH (ref 0.50–1.35)
GFR calc non Af Amer: 35 mL/min — ABNORMAL LOW (ref >90)
GFR, EST AFRICAN AMERICAN: 41 mL/min — AB (ref >90)
GLUCOSE: 146 mg/dL — AB (ref 70–99)
POTASSIUM: 2.9 mmol/L — AB (ref 3.5–5.1)
Sodium: 117 mmol/L — CL (ref 135–145)

## 2014-09-23 LAB — SODIUM
SODIUM: 119 mmol/L — AB (ref 135–145)
Sodium: 120 mmol/L — ABNORMAL LOW (ref 135–145)

## 2014-09-23 LAB — CBC
HEMATOCRIT: 32.8 % — AB (ref 39.0–52.0)
Hemoglobin: 11.1 g/dL — ABNORMAL LOW (ref 13.0–17.0)
MCH: 25.6 pg — AB (ref 26.0–34.0)
MCHC: 33.8 g/dL (ref 30.0–36.0)
MCV: 75.8 fL — ABNORMAL LOW (ref 78.0–100.0)
Platelets: 308 10*3/uL (ref 150–400)
RBC: 4.33 MIL/uL (ref 4.22–5.81)
RDW: 17.4 % — AB (ref 11.5–15.5)
WBC: 12.5 10*3/uL — AB (ref 4.0–10.5)

## 2014-09-23 LAB — PROTIME-INR
INR: 2.88 — AB (ref 0.00–1.49)
Prothrombin Time: 30.4 seconds — ABNORMAL HIGH (ref 11.6–15.2)

## 2014-09-23 LAB — GLUCOSE, CAPILLARY: Glucose-Capillary: 151 mg/dL — ABNORMAL HIGH (ref 70–99)

## 2014-09-23 MED ORDER — ALTEPLASE 2 MG IJ SOLR
2.0000 mg | Freq: Once | INTRAMUSCULAR | Status: AC
  Administered 2014-09-23: 2 mg
  Filled 2014-09-23: qty 2

## 2014-09-23 MED ORDER — POTASSIUM CHLORIDE CRYS ER 20 MEQ PO TBCR
40.0000 meq | EXTENDED_RELEASE_TABLET | Freq: Once | ORAL | Status: AC
  Administered 2014-09-23: 40 meq via ORAL
  Filled 2014-09-23: qty 2

## 2014-09-23 MED ORDER — TOLVAPTAN 15 MG PO TABS
30.0000 mg | ORAL_TABLET | ORAL | Status: AC
  Administered 2014-09-23: 30 mg via ORAL
  Filled 2014-09-23: qty 2

## 2014-09-23 MED ORDER — WARFARIN SODIUM 2 MG PO TABS
2.0000 mg | ORAL_TABLET | Freq: Once | ORAL | Status: AC
  Administered 2014-09-23: 2 mg via ORAL
  Filled 2014-09-23: qty 1

## 2014-09-23 NOTE — Progress Notes (Signed)
CSW met this afternoon with patient and his wife Premitila via Child psychotherapist after being notified by Darrick Grinder, NP that patient now agrees (again) to go to [facility].  Patient has had multiple hospitalizations and in the past has agreed to go to SNF but on day of d/c has refused (or his wife refused).  CSW confirmed via interpreter that patient is agreeable to SNF- however- it should be noted that his wife is extremely hesitant and asked multiple times if she could take him home at night, if she could take him out of the facility after one day and was very concerned that the SNF would "keep him against her will and not allow him to leave."  She required extensive support and encouragement but remained clearly hesitant about plan of care.  Patient is openly agreeable at this time for SNF as he does not want to "be a burden" on his wife.  Via Interpreter, patient and wife agreed to a "trial" for 1 week at the SNF to see how things work out and if unacceptable- then will ask for d/c home with continued home health.  Fl2 updated and sent to [facility]- notified Gayla Medicus , Admissions who is aware and agreeable to accept patient. Fl2 on chart for MD's signature.    Lorie Phenix. Pauline Good, [place]

## 2014-09-23 NOTE — Progress Notes (Addendum)
CRITICAL VALUE ALERT  Critical value received:  Na  117  Date of notification:  09/23/2014  Time of notification:  0627  Critical value read back:Yes.    Nurse who received alert:  Kendrick Ranch  MD notified (1st page):  PA Gaspar Bidding  Time of first page:  0627  MD notified (2nd page):  Time of second page:  Responding MD:  PA Gaspar Bidding  Time MD responded:  [phone]

## 2014-09-23 NOTE — Progress Notes (Signed)
Pt takes his EKG monitor off even after putting them on pt's chest and be reminded it keep it on . Verbally refused it and frequent monitoriing done

## 2014-09-23 NOTE — Progress Notes (Signed)
Full note to follow:   I attempted to schedule a meeting to discuss with Tyler Christensen and his wife but his wife tells me that she cannot meet with me except for 6-7 pm and we are not available for consult at this time - I attempted to scheduled this for a couple days but same issue. I did utilize interpretor, Paediatric nurse, to help discuss briefly with Tyler Christensen his natural disease trajectory. He tells me that he wants [name] to make him better - I informed him that we are doing everything we can to do so but that even all these medications stop working at some point and we are worried we might be at that point. He tells me that he has no fears or concerns if things do not turn out good and that if so "that is his destiny." He is very flat and appears depressed and is difficult to engage. He has very poor appetite and much weight loss but he denies weakness, fatigue, shortness of breath. He did not have any questions or concerns and I did not press him for further conversation as he did not appear interested. I will attempt follow up.   Vinie Sill, NP Palliative Medicine Team Pager # [phone] (M-F 8a-5p) Team Phone # [phone] (Nights/Weekends)

## 2014-09-23 NOTE — Progress Notes (Signed)
Patient was schedule for d/c today to [facility] but determined by MD not to be stable at this time.  CSW had appointment with patient's wife and Interpreter at 20;30 but wife did not show up. Interpreter discussed via phone that d/c would be delayed.  Will monitor for possible d/c tomorrow.  Notified Gayla Medicus, RN [place].  Lorie Phenix. Pauline Good, [place]

## 2014-09-23 NOTE — Progress Notes (Signed)
ANTICOAGULATION CONSULT NOTE - Follow Up Consult  Pharmacy Consult for Coumadin Indication: atrial fibrillation  Allergies  Allergen Reactions  . Lisinopril Rash    ( pt denies this allergy)    Patient Measurements: Height: 5\' 6"  (167.6 cm) (estimated. no speak english) Weight: 135 lb 5.8 oz (61.4 kg) IBW/kg (Calculated) : 63.8  Vital Signs: Temp: 97.8 F (36.6 C) (04/05 0411) Temp Source: Oral (04/05 0411) BP: 98/59 mmHg (04/05 0411) Pulse Rate: 76 (04/05 0411)  Labs:  Recent Labs  09/21/14 0220 09/21/14 1916 09/22/14 0107 09/22/14 0602 09/23/14 0519  HGB 11.2*  --   --   --  11.1*  HCT 34.9*  --   --   --  32.8*  PLT 375  --   --   --  308  LABPROT 30.6*  --   --  30.5* 30.4*  INR 2.90*  --   --  2.90* 2.88*  CREATININE 2.12*  --   --  1.78* 1.76*  TROPONINI 0.05* 0.05* 0.06* 0.04*  --     Estimated Creatinine Clearance: 29.6 mL/min (by C-G formula based on Cr of 1.76).  Assessment: 81yom continues on coumadin for afib. INR is therapeutic at 2.88. CBC stable. No bleeding reported. Continues on amiodarone as PTA.   Home dose: 4mg  MWFSun, 2mg  TTSat  Goal of Therapy:  INR 2-3 Monitor platelets by anticoagulation protocol: Yes   Plan:  1) Coumadin 2mg  x 1 tonight per home regimen 2) INR in AM  Deboraha Sprang 09/23/2014,10:07 AM

## 2014-09-23 NOTE — Progress Notes (Addendum)
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   Recently discharged on IV lasix 3 times a week M-W-F from [facility].   Admitted with abdominal pain. Creatinine elevated on admit so spiro and torsemide held. Yesterday home diuretic regimen resumed. Weight up 2 pounds.   Denies SOB. Denies abdominal pain.   WBC 18.8 >12.5  Na 122>117  Objective:   Weight Range:  Vital Signs:   Temp:  [97.5 F (36.4 C)-97.8 F (36.6 C)] 97.8 F (36.6 C) (04/05 0411) Pulse Rate:  [70-76] 76 (04/05 0411) Resp:  [16-18] 18 (04/05 0411) BP: (77-98)/(35-59) 98/59 mmHg (04/05 0411) SpO2:  [97 %-100 %] 97 % (04/05 0411) Weight:  [135 lb 5.8 oz (61.4 kg)] 135 lb 5.8 oz (61.4 kg) (04/05 0411) Last BM Date: 09/21/14  Weight change: Filed Weights   09/21/14 1507 09/22/14 0534 09/23/14 0411  Weight: 137 lb 1.6 oz (62.188 kg) 133 lb 9.6 oz (60.601 kg) 135 lb 5.8 oz (61.4 kg)    Intake/Output:   Intake/Output Summary (Last 24 hours) at 09/23/14 0945 Last data filed at 09/23/14 0859  Gross per 24 hour  Intake 1285.6 ml  Output   1550 ml  Net -264.4 ml     Physical Exam: General: Elderly. Sitting in chair. NAD. HEENT: normal Neck: supple. JVP 9-10 Carotids 2+ bilaterally; no bruits. No lymphadenopathy or thryomegaly appreciated.  Cor: PMI laterally displaced. Regular rhythm and rate. + s3  Lungs: clear Abdomen: soft, nontender, non distended. No hepatosplenomegaly. No bruits or masses. + bowel sounds. Extremities: no cyanosis, clubbing, rash. No edema Neuro: alert & orientedx3, cranial nerves grossly intact. Moves all 4 extremities w/o difficulty. Affect  pleasant.  Telemetry:  AV paced   Labs: Basic Metabolic Panel:  Recent Labs Lab 09/17/14 0306 09/18/14 0445 09/21/14 0220 09/22/14 0602 09/23/14 0519  NA 127* 124* 122* 122* 117*  K 3.7 3.0* 5.3* 3.6 2.9*  CL 88* 82* 85* 83* 77*  CO2 28 30 22 26 27   GLUCOSE 122* 160* 198* 137* 146*  BUN 73* 66* 85* 78* 75*  CREATININE 1.80* 1.66* 2.12* 1.78* 1.76*  CALCIUM 8.9 8.8 9.2 9.0 9.2    Liver Function Tests: No results for input(s): AST, ALT, ALKPHOS, BILITOT, PROT, ALBUMIN in the last 168 hours. No results for input(s): LIPASE, AMYLASE in the last 168 hours. No results for input(s): AMMONIA in the last 168 hours.  CBC:  Recent Labs Lab 09/21/14 0220 09/23/14 0519  WBC 18.8* 12.5*  NEUTROABS 16.8*  --   HGB 11.2* 11.1*  HCT 34.9* 32.8*  MCV 79.0 75.8*  PLT 375 308    Cardiac Enzymes:  Recent Labs Lab 09/21/14 0220 09/21/14 1916 09/22/14 0107 09/22/14 0602  TROPONINI 0.05* 0.05* 0.06* 0.04*    BNP: BNP (last 3 results)  Recent Labs  09/15/14 0209 09/16/14 0250 09/21/14 0220  BNP 2970.9* 3495.2* 2515.7*    ProBNP (last 3 results)  Recent Labs  03/01/14 1959 03/08/14 2230 06/05/14 0332  PROBNP 11880.0* 15334.0* 19533.0*      Other results:  Imaging: No results found.   Medications:     Scheduled Medications: . amiodarone  200 mg Oral Daily  . fentaNYL  25 mcg Intravenous Once  . magnesium oxide  400 mg Oral BID  . metoCLOPramide  10 mg Oral BID  . polyethylene glycol  17 g Oral Daily  . sildenafil  20 mg Oral TID  . sodium chloride  10-40 mL Intracatheter Q12H  . sodium chloride  3 mL Intravenous Q12H  . sorbitol  30 mL Oral Daily  . spironolactone  25 mg Oral BID  . torsemide  80 mg Oral BID  . Warfarin - Pharmacist Dosing Inpatient   Does not apply q1800    Infusions: . milrinone 0.5 mcg/kg/min (09/22/14 2224)    PRN Medications: sodium chloride, acetaminophen, LORazepam, ondansetron (ZOFRAN) IV, sodium chloride, sodium  chloride, traMADol   Assessment   1.  Acute on chronic systolic HF (EF 16%) on home milrinone 2. Acute on CKD, stage III-IV 3. Hyperkalemia 4. PAF in NSR on amio 5. Retroperitoneal mass. 6. hyponatremia 7. PAH 8. Hypocalcemia 9. Acute on chronic respiratory failure 10. DNR 11. Elevated WBC  12. Hyponatremia   Plan:   Hyponatremic. Sodium trending down. Give tolvaptan 30 mg daily. Check BMET every 6 hours x 3.  Continue torsemide to 80 mg twice a day and spiro 25 mg twice a day.  Continue milrinone 0.5 mcg.    WBC coming down.     Maintaining SR. Continue amiodarone 200 mg daily. INR 2.88. Pharmacy addressing INR.   Consult palliative care again for goals of care as we have no options for ongoing end stage heart failure.   Hold discharge to SNF. SW aware.   Length of Stay: 2   CLEGG,[name]  NP-C   09/23/2014, 9:45 AM  Advanced Heart Failure Team Pager [phone] (M-F; [place])  Please contact [facility] for night-coverage after hours (4p -7a ) and weekends on [email or web address]  Patient seen with NP, agree with the above note.  Feels better this morning, WBCs down.  However, hyponatremia is worse. Weight up 2 lbs.  - Continue home diuretics but give dose of tolvaptan.  - BMET in am, if sodium better, will probably let him go to SNF tomorrow.  - 1500 cc fluid restriction  [name] 09/23/2014 12:43 PM

## 2014-09-23 NOTE — Consult Note (Signed)
Patient Tyler Christensen      DOB: 11/08/1932      XHB:[id number]     Consult Note from the Palliative Medicine Team at [facility] Requested by: Darrick Grinder, NP    PCP: Harvie Junior, MD Reason for Consultation: End stage heart failure   Phone [phone]  Assessment of patients Current state: I attempted to schedule a meeting to discuss with Tyler Christensen and Tyler Christensen but Tyler Christensen tells me that she cannot meet with me except for 6-7 pm and we are not available for consult at this time - I attempted to scheduled this for a couple days but same issue. I did utilize interpretor, Paediatric nurse, to help discuss briefly with Tyler Christensen Tyler natural disease trajectory. He tells me that he wants [name] to make him better - I informed him that we are doing everything we can to do so but that even all these medications stop working at some point and we are worried we might be at that point. He tells me that he has no fears or concerns if things do not turn out good and that if so "that is Tyler destiny." He is very flat and appears depressed and is difficult to engage. He has very poor appetite and much weight loss but he denies weakness, fatigue, shortness of breath. He did not have any questions or concerns and I did not press him for further conversation as he did not appear interested. I will attempt follow up.    Goals of Care: 1.  Code Status: DNR - did not discuss   2. Disposition: Plan is for SNF    3. Symptom Management:   1. Decreased appetite: Encourage foods that he likes and small frequent meals. Feeding supplements would be recommended but limited by fluid restriction. This is from worsening illness and decline and is unlikely to improve.   2. Depressed: He is very flat is sitting on side of bed hunched over and makes very limited eye contact. He says he wants to live. This is my first time meeting him but he definitely appears to have situational depression which is understandable. I  would recommend low dose remeron qhs 7.5-15 mg qhs (increase appetite and help with sleep and depression) but fear Tyler poor prognosis may not grant him time to see great benefits but may be helpful in the short term.   4. Psychosocial: Emotional support provided to patient.   5. Spiritual: He talks about the future as "Tyler destiny" and seems to believe and have some acceptance in what the future holds although he expresses desire for [name] to help him continue to live.    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 Recently discharged on IV lasix 3 times a week M-W-F from [facility]. Admitted with abdominal pain. Creatinine elevated on admit so spiro and torsemide held. Abdominal pain improved. He is on milrinone and is planned for discharge to SNF but hyponatremic. PMH reviewed.    ROS: Denies pain, weakness, shortness of breath. + decreased appetite    PMH:  Past Medical History  Diagnosis Date  . HTN (hypertension)   . High cholesterol   . CHF (congestive heart failure)   .  BBBB (bilateral bundle branch block)   . LBBB (left bundle branch block)   . Seizures 06/11/2012    new onset/notes (06/11/2012)  . SOB (shortness of breath)     "sometimes when I lay down;; related to not taking my RX" (06/11/2012)  . Myocardial infarction     06/10/2012  . Atrial fibrillation   . Stroke   . Atrial thrombus     left  . Pulmonary HTN   . Atrial fibrillation 06/12/2012  . Coronary artery disease      PSH: Past Surgical History  Procedure Laterality Date  . Throat surgery  1942    "and nose" (06/11/2012); ?T&A  . Inguinal hernia repair  ~ 2008    "both sides" (06/11/2012)  . Tee without cardioversion   06/29/2012    Procedure: TRANSESOPHAGEAL ECHOCARDIOGRAM (TEE);  Surgeon: Jolaine Artist, MD;  Location: [facility] ENDOSCOPY;   . Cardioversion N/A 08/14/2012    Procedure: CARDIOVERSION;  Surgeon: Jolaine Artist, MD;  Location: [facility] ENDOSCOPY;  . Tee without cardioversion N/A 11/12/2013    Procedure: TRANSESOPHAGEAL ECHOCARDIOGRAM (TEE);  Surgeon: Candee Furbish, MD;  Location: [facility] ENDOSCOPY;   . Tee without cardioversion N/A 11/19/2013    Procedure: TRANSESOPHAGEAL ECHOCARDIOGRAM (TEE);  Surgeon: Jolaine Artist, MD;  Location: [facility] ENDOSCOPY;   . Cardioversion N/A 12/27/2013    Procedure: CARDIOVERSION;  Surgeon: Jolaine Artist, MD;  Location: [facility] ENDOSCOPY;   . Left and right heart catheterization with coronary angiogram N/A 07/02/2012    Procedure: LEFT AND RIGHT HEART CATHETERIZATION WITH CORONARY ANGIOGRAM;  Surgeon: Jolaine Artist, MD;  Location: [facility] CATH LAB; Mild non-obstructive CAD, severe PAH, PA = 93/42 (59), severe NICM w/ EF 15%  . Right heart catheterization N/A 07/06/2012    Procedure: RIGHT HEART CATH;  Surgeon: Jolaine Artist, MD; to assess response to milrinone; Moderate PH with profound reduction in PVR with milrinone/PDE-5 inhibitor   . Bi-ventricular implantable cardioverter defibrillator N/A 10/26/2012    Procedure: BI-VENTRICULAR IMPLANTABLE CARDIOVERTER DEFIBRILLATOR  (CRT-D);  Surgeon: Evans Lance, MD;  Medtronic Evera XT CRTD, BiV ICD serial #[id number]     I have reviewed the FH and SH and  If appropriate update it with new information. Allergies  Allergen Reactions  . Lisinopril Rash    ( pt denies this allergy)   Scheduled Meds: . amiodarone  200 mg Oral Daily  . fentaNYL  25 mcg Intravenous Once  . magnesium oxide  400 mg Oral BID  . metoCLOPramide  10 mg Oral BID  . polyethylene glycol  17 g Oral Daily  . potassium chloride  40 mEq Oral Once  . potassium chloride  40 mEq Oral BID  . sildenafil  20 mg Oral TID  . sodium chloride  10-40 mL Intracatheter  Q12H  . sodium chloride  3 mL Intravenous Q12H  . sorbitol  30 mL Oral Daily  . spironolactone  25 mg Oral BID  . torsemide  40 mg Oral BID  . Warfarin - Pharmacist Dosing Inpatient   Does not apply q1800   Continuous Infusions: . milrinone 0.5 mcg/kg/min (09/23/14 2158)   PRN Meds:.sodium chloride, acetaminophen, LORazepam, ondansetron (ZOFRAN) IV, sodium chloride, sodium chloride, traMADol    BP 84/44 mmHg  Pulse 72  Temp(Src) 97.5 F (36.4 C) (Oral)  Resp 16  Ht 5\' 6"  (1.676 m)  Wt 59.92 kg (132 lb 1.6 oz)  BMI 21.33 kg/m2  SpO2 99%   PPS: 20%   Intake/Output Summary (Last 24  hours) at 09/24/14 0951 Last data filed at 09/24/14 0600  Gross per 24 hour  Intake 1043.6 ml  Output   1090 ml  Net  -46.4 ml   LBM: 4/3  Physical Exam:  General: NAD, sitting up on side of bed, cachectic HEENT: [place]/AT, +JVD, moist mucous membranes Chest: No labored breathing, symmetric CVS: RRR Abdomen: Soft, NT, ND Ext: MAE, no edema Neuro: Awake, alert, oriented x 3  Labs: CBC    Component Value Date/Time   WBC 12.7* 09/24/2014 0530   RBC 4.29 09/24/2014 0530   HGB 10.9* 09/24/2014 0530   HCT 32.6* 09/24/2014 0530   PLT 341 09/24/2014 0530   MCV 76.0* 09/24/2014 0530   MCH 25.4* 09/24/2014 0530   MCHC 33.4 09/24/2014 0530   RDW 17.4* 09/24/2014 0530   LYMPHSABS 1.5 09/21/2014 0220   MONOABS 0.5 09/21/2014 0220   EOSABS 0.1 09/21/2014 0220   BASOSABS 0.0 09/21/2014 0220    BMET    Component Value Date/Time   NA 119* 09/24/2014 0530   K 2.6* 09/24/2014 0530   CL 76* 09/24/2014 0530   CO2 29 09/24/2014 0530   GLUCOSE 146* 09/24/2014 0530   BUN 72* 09/24/2014 0530   CREATININE 1.62* 09/24/2014 0530   CALCIUM 9.2 09/24/2014 0530   GFRNONAA 39* 09/24/2014 0530   GFRAA 45* 09/24/2014 0530    CMP     Component Value Date/Time   NA 119* 09/24/2014 0530   K 2.6* 09/24/2014 0530   CL 76* 09/24/2014 0530   CO2 29 09/24/2014 0530   GLUCOSE 146* 09/24/2014 0530    BUN 72* 09/24/2014 0530   CREATININE 1.62* 09/24/2014 0530   CALCIUM 9.2 09/24/2014 0530   PROT 7.2 09/16/2014 0250   ALBUMIN 3.4* 09/16/2014 0250   AST 35 09/16/2014 0250   ALT 42 09/16/2014 0250   ALKPHOS 223* 09/16/2014 0250   BILITOT 0.9 09/16/2014 0250   GFRNONAA 39* 09/24/2014 0530   GFRAA 45* 09/24/2014 0530     Time In Time Out Total Time Spent with Patient Total Overall Time  1230 1315 17min 80min    Greater than 50%  of this time was spent counseling and coordinating care related to the above assessment and plan.   Vinie Sill, NP Palliative Medicine Team Pager # [phone] (M-F 8a-5p) Team Phone # [phone] (Nights/Weekends)

## 2014-09-24 LAB — CBC
HCT: 32.6 % — ABNORMAL LOW (ref 39.0–52.0)
Hemoglobin: 10.9 g/dL — ABNORMAL LOW (ref 13.0–17.0)
MCH: 25.4 pg — AB (ref 26.0–34.0)
MCHC: 33.4 g/dL (ref 30.0–36.0)
MCV: 76 fL — AB (ref 78.0–100.0)
PLATELETS: 341 10*3/uL (ref 150–400)
RBC: 4.29 MIL/uL (ref 4.22–5.81)
RDW: 17.4 % — ABNORMAL HIGH (ref 11.5–15.5)
WBC: 12.7 10*3/uL — ABNORMAL HIGH (ref 4.0–10.5)

## 2014-09-24 LAB — BASIC METABOLIC PANEL
ANION GAP: 14 (ref 5–15)
BUN: 72 mg/dL — ABNORMAL HIGH (ref 6–23)
CO2: 29 mmol/L (ref 19–32)
CREATININE: 1.62 mg/dL — AB (ref 0.50–1.35)
Calcium: 9.2 mg/dL (ref 8.4–10.5)
Chloride: 76 mmol/L — ABNORMAL LOW (ref 96–112)
GFR, EST AFRICAN AMERICAN: 45 mL/min — AB (ref >90)
GFR, EST NON AFRICAN AMERICAN: 39 mL/min — AB (ref >90)
Glucose, Bld: 146 mg/dL — ABNORMAL HIGH (ref 70–99)
POTASSIUM: 2.6 mmol/L — AB (ref 3.5–5.1)
Sodium: 119 mmol/L — CL (ref 135–145)

## 2014-09-24 LAB — PROTIME-INR
INR: 2.66 — ABNORMAL HIGH (ref 0.00–1.49)
Prothrombin Time: 28.5 seconds — ABNORMAL HIGH (ref 11.6–15.2)

## 2014-09-24 LAB — SODIUM: SODIUM: 119 mmol/L — AB (ref 135–145)

## 2014-09-24 MED ORDER — POTASSIUM CHLORIDE CRYS ER 20 MEQ PO TBCR
40.0000 meq | EXTENDED_RELEASE_TABLET | Freq: Two times a day (BID) | ORAL | Status: DC
  Administered 2014-09-25 (×2): 40 meq via ORAL
  Filled 2014-09-24 (×3): qty 2

## 2014-09-24 MED ORDER — TOLVAPTAN 15 MG PO TABS
30.0000 mg | ORAL_TABLET | ORAL | Status: DC
  Filled 2014-09-24: qty 2

## 2014-09-24 MED ORDER — TORSEMIDE 20 MG PO TABS
40.0000 mg | ORAL_TABLET | Freq: Two times a day (BID) | ORAL | Status: DC
  Administered 2014-09-24 – 2014-09-25 (×3): 40 mg via ORAL
  Filled 2014-09-24 (×5): qty 2

## 2014-09-24 MED ORDER — POTASSIUM CHLORIDE CRYS ER 20 MEQ PO TBCR
40.0000 meq | EXTENDED_RELEASE_TABLET | Freq: Once | ORAL | Status: AC
  Administered 2014-09-24: 40 meq via ORAL
  Filled 2014-09-24: qty 2

## 2014-09-24 MED ORDER — WARFARIN SODIUM 4 MG PO TABS
4.0000 mg | ORAL_TABLET | Freq: Once | ORAL | Status: AC
  Administered 2014-09-24: 4 mg via ORAL
  Filled 2014-09-24: qty 1

## 2014-09-24 NOTE — Progress Notes (Signed)
Patient BP soft this am 84/44, HR 72, Patient asymptomatic. PA notified. See manage order for medication adjustment. Will continue to monitor patient.

## 2014-09-24 NOTE — Progress Notes (Signed)
I attempted to engage with Tyler Christensen again today utilizing interpretor via telephone. However, he is less interactive today and appears half asleep. He will not even answer most of my questions and when he does he has delayed response. Prognosis is extremely poor. Unfortunately his wife was unwilling to meet with me during the daytime to further discuss goals of care - I did not call her again today. I will not be here tomorrow 4/7 but please call [phone] with acute palliative needs. I will attempt follow-up Friday 4/8 if he is still hospitalized.   Vinie Sill, NP Palliative Medicine Team Pager # [phone] (M-F 8a-5p) Team Phone # [phone] (Nights/Weekends)

## 2014-09-24 NOTE — Progress Notes (Signed)
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   Recently discharged on IV lasix 3 times a week M-W-F from [facility].   Admitted with abdominal pain. Creatinine elevated on admit so spiro and torsemide held. Back on  home diuretic regimen 4/5  resumed. Yesterday samsca started for low sodium. Weight down 3 pounds.   Denies SOB. Denies abdominal pain.   WBC 18.8 >12.5>12.7   Na 122>117>119   Objective:   Weight Range:  Vital Signs:   Temp:  [97.5 F (36.4 C)-98 F (36.7 C)] 97.5 F (36.4 C) (04/06 0459) Pulse Rate:  [67-75] 67 (04/06 0459) Resp:  [15-16] 16 (04/06 0459) BP: (99-115)/(43-51) 102/49 mmHg (04/06 0459) SpO2:  [92 %-100 %] 99 % (04/06 0459) Weight:  [132 lb 1.6 oz (59.92 kg)] 132 lb 1.6 oz (59.92 kg) (04/06 0459) Last BM Date: 09/23/14  Weight change: Filed Weights   09/22/14 0534 09/23/14 0411 09/24/14 0459  Weight: 133 lb 9.6 oz (60.601 kg) 135 lb 5.8 oz (61.4 kg) 132 lb 1.6 oz (59.92 kg)    Intake/Output:   Intake/Output Summary (Last 24 hours) at 09/24/14 0816 Last data filed at 09/24/14 0600  Gross per 24 hour  Intake 1163.6 ml  Output   1215 ml  Net  -51.4 ml     Physical Exam: General: Elderly. Sitting in chair. NAD. HEENT: normal Neck: supple. JVP flat.  Carotids 2+ bilaterally; no bruits. No lymphadenopathy or thryomegaly appreciated.  Cor: PMI laterally displaced. Regular rhythm and rate. + s3  Lungs: clear Abdomen: soft, nontender, non distended. No hepatosplenomegaly. No bruits or masses. + bowel sounds. Extremities: no cyanosis, clubbing, rash. No edema Neuro: alert & orientedx3, cranial nerves grossly intact.  Moves all 4 extremities w/o difficulty. Affect pleasant.  Telemetry:  AV paced   Labs: Basic Metabolic Panel:  Recent Labs Lab 09/18/14 0445 09/21/14 0220 09/22/14 0602 09/23/14 0519 09/23/14 1210 09/23/14 1907 09/24/14 0530  NA 124* 122* 122* 117* 120* 119* 119*  K 3.0* 5.3* 3.6 2.9*  --   --  2.6*  CL 82* 85* 83* 77*  --   --  76*  CO2 30 22 26 27   --   --  29  GLUCOSE 160* 198* 137* 146*  --   --  146*  BUN 66* 85* 78* 75*  --   --  72*  CREATININE 1.66* 2.12* 1.78* 1.76*  --   --  1.62*  CALCIUM 8.8 9.2 9.0 9.2  --   --  9.2    Liver Function Tests: No results for input(s): AST, ALT, ALKPHOS, BILITOT, PROT, ALBUMIN in the last 168 hours. No results for input(s): LIPASE, AMYLASE in the last 168 hours. No results for input(s): AMMONIA in the last 168 hours.  CBC:  Recent Labs Lab 09/21/14 0220 09/23/14 0519 09/24/14 0530  WBC 18.8* 12.5* 12.7*  NEUTROABS 16.8*  --   --   HGB 11.2* 11.1* 10.9*  HCT 34.9* 32.8* 32.6*  MCV 79.0 75.8* 76.0*  PLT 375 308 341    Cardiac Enzymes:  Recent Labs Lab 09/21/14 0220 09/21/14 1916 09/22/14 0107 09/22/14 0602  TROPONINI 0.05* 0.05* 0.06* 0.04*    BNP:  BNP (last 3 results)  Recent Labs  09/15/14 0209 09/16/14 0250 09/21/14 0220  BNP 2970.9* 3495.2* 2515.7*    ProBNP (last 3 results)  Recent Labs  03/01/14 1959 03/08/14 2230 06/05/14 0332  PROBNP 11880.0* 15334.0* 19533.0*      Other results:  Imaging: No results found.   Medications:     Scheduled Medications: . amiodarone  200 mg Oral Daily  . fentaNYL  25 mcg Intravenous Once  . magnesium oxide  400 mg Oral BID  . metoCLOPramide  10 mg Oral BID  . polyethylene glycol  17 g Oral Daily  . potassium chloride  40 mEq Oral Once  . potassium chloride  40 mEq Oral Once  . sildenafil  20 mg Oral TID  . sodium chloride  10-40 mL Intracatheter Q12H  . sodium chloride  3 mL Intravenous Q12H  . sorbitol  30 mL Oral Daily  . spironolactone   25 mg Oral BID  . torsemide  80 mg Oral BID  . Warfarin - Pharmacist Dosing Inpatient   Does not apply q1800    Infusions: . milrinone 0.5 mcg/kg/min (09/23/14 2158)    PRN Medications: sodium chloride, acetaminophen, LORazepam, ondansetron (ZOFRAN) IV, sodium chloride, sodium chloride, traMADol   Assessment   1.  Acute on chronic systolic HF (EF 22%) on home milrinone 2. Acute on CKD, stage III-IV 3. Hyperkalemia 4. PAF in NSR on amio 5. Retroperitoneal mass. 6. hyponatremia 7. PAH 8. Hypocalcemia 9. Acute on chronic respiratory failure 10. DNR 11. Elevated WBC  12. Hyponatremia 13. Hypkalemia   Plan:   Hyponatremic. Sodium up slightly.117>119. Give tolvaptan 30 mg daily. Check BMET every 6 hours x 3.    Volume status ok. Weight down another 3 pounds. Cut torsemide dose to 40 mg twice a day and continue spiro 25 mg twice a day. Continue milrinone 0.5 mcg.  Supplement K and increase daily potassium to 40 meq twice daily. May need to adjust potassium dose later today.   WBC unchanged 12.7. Blood Cultures --final NGTD.      Maintaining SR. Continue amiodarone 200 mg daily. INR 2.6. Pharmacy addressing INR.   Consult palliative care again for goals of care as we have no options for ongoing end stage heart failure.   Hold discharge to SNF. SW aware.   Length of Stay: 3   CLEGG,[name]  NP-C   09/24/2014, 8:16 AM  Advanced Heart Failure Team Pager [phone] (M-F; 7a - 4p)  Please contact [facility] for night-coverage after hours (4p -7a ) and weekends on [email or web address]  Patient seen with NP, agree with the above note.  He is not significantly volume overloaded at this point but sodium remains lower than baseline, would like to see this higher prior to discharging him to SNF.  Tolvaptan dose today. Reassess tomorrow, hopefully SNF.   [name] 09/24/2014 8:37 AM

## 2014-09-24 NOTE — Progress Notes (Signed)
CRITICAL VALUE ALERT  Critical value received:  Na=119, K= 2.6  Date of notification:  09/24/2014  Time of notification:  0659  Critical value read back:Yes.    Nurse who received alert:  Kendrick Ranch  MD notified (1st page):  Tarri Fuller  Time of first page:  [phone]  MD notified (2nd page):  Time of second page:  Responding MD:  Tarri Fuller  Time MD responded:  0700

## 2014-09-24 NOTE — Progress Notes (Signed)
BP 92/50. PA notified. OK to give meds, but to stagger them apart from one another. Will continue to monitor patient for further changes in condition.

## 2014-09-24 NOTE — Progress Notes (Signed)
ANTICOAGULATION CONSULT NOTE - Follow Up Consult  Pharmacy Consult for Coumadin Indication: atrial fibrillation  Allergies  Allergen Reactions  . Lisinopril Rash    ( pt denies this allergy)    Patient Measurements: Height: 5\' 6"  (167.6 cm) (estimated. no speak english) Weight: 132 lb 1.6 oz (59.92 kg) IBW/kg (Calculated) : 63.8  Vital Signs: Temp: 97.5 F (36.4 C) (04/06 0459) Temp Source: Oral (04/06 0459) BP: 84/44 mmHg (04/06 0821) Pulse Rate: 72 (04/06 0821)  Labs:  Recent Labs  09/21/14 1916 09/22/14 0107 09/22/14 0602 09/23/14 0519 09/24/14 0530  HGB  --   --   --  11.1* 10.9*  HCT  --   --   --  32.8* 32.6*  PLT  --   --   --  308 341  LABPROT  --   --  30.5* 30.4* 28.5*  INR  --   --  2.90* 2.88* 2.66*  CREATININE  --   --  1.78* 1.76* 1.62*  TROPONINI 0.05* 0.06* 0.04*  --   --     Estimated Creatinine Clearance: 31.3 mL/min (by C-G formula based on Cr of 1.62).  Assessment: 81yom continues on coumadin for afib. INR is therapeutic at 2.66. CBC stable. No bleeding reported. Continues on amiodarone as PTA.   Home dose: 4mg  MWFSun, 2mg  TTSat  Goal of Therapy:  INR 2-3 Monitor platelets by anticoagulation protocol: Yes   Plan:  1) Coumadin 4mg  x 1 tonight per home regimen 2) INR in AM  Deboraha Sprang 09/24/2014,10:10 AM

## 2014-09-25 ENCOUNTER — Inpatient Hospital Stay (HOSPITAL_COMMUNITY): Admission: RE | Admit: 2014-09-25 | Payer: Medicare Other | Source: Ambulatory Visit

## 2014-09-25 ENCOUNTER — Inpatient Hospital Stay (HOSPITAL_COMMUNITY): Payer: Medicare Other

## 2014-09-25 LAB — CBC
HCT: 32.5 % — ABNORMAL LOW (ref 39.0–52.0)
Hemoglobin: 10.7 g/dL — ABNORMAL LOW (ref 13.0–17.0)
MCH: 24.9 pg — ABNORMAL LOW (ref 26.0–34.0)
MCHC: 32.9 g/dL (ref 30.0–36.0)
MCV: 75.8 fL — AB (ref 78.0–100.0)
PLATELETS: 348 10*3/uL (ref 150–400)
RBC: 4.29 MIL/uL (ref 4.22–5.81)
RDW: 17.2 % — ABNORMAL HIGH (ref 11.5–15.5)
WBC: 14.1 10*3/uL — AB (ref 4.0–10.5)

## 2014-09-25 LAB — BASIC METABOLIC PANEL
ANION GAP: 16 — AB (ref 5–15)
BUN: 70 mg/dL — ABNORMAL HIGH (ref 6–23)
CALCIUM: 8.4 mg/dL (ref 8.4–10.5)
CO2: 25 mmol/L (ref 19–32)
Chloride: 76 mmol/L — ABNORMAL LOW (ref 96–112)
Creatinine, Ser: 1.73 mg/dL — ABNORMAL HIGH (ref 0.50–1.35)
GFR calc non Af Amer: 36 mL/min — ABNORMAL LOW (ref >90)
GFR, EST AFRICAN AMERICAN: 41 mL/min — AB (ref >90)
Glucose, Bld: 238 mg/dL — ABNORMAL HIGH (ref 70–99)
POTASSIUM: 2.4 mmol/L — AB (ref 3.5–5.1)
SODIUM: 117 mmol/L — AB (ref 135–145)

## 2014-09-25 LAB — PROTIME-INR
INR: 2.39 — ABNORMAL HIGH (ref 0.00–1.49)
Prothrombin Time: 26.3 seconds — ABNORMAL HIGH (ref 11.6–15.2)

## 2014-09-25 LAB — SODIUM: SODIUM: 119 mmol/L — AB (ref 135–145)

## 2014-09-25 MED ORDER — WARFARIN SODIUM 4 MG PO TABS
4.0000 mg | ORAL_TABLET | Freq: Once | ORAL | Status: AC
  Administered 2014-09-25: 4 mg via ORAL
  Filled 2014-09-25: qty 1

## 2014-09-25 MED ORDER — POTASSIUM CHLORIDE CRYS ER 20 MEQ PO TBCR
40.0000 meq | EXTENDED_RELEASE_TABLET | Freq: Three times a day (TID) | ORAL | Status: DC
  Administered 2014-09-25 – 2014-09-26 (×5): 40 meq via ORAL
  Filled 2014-09-25 (×8): qty 2

## 2014-09-25 MED ORDER — TORSEMIDE 20 MG PO TABS
80.0000 mg | ORAL_TABLET | Freq: Two times a day (BID) | ORAL | Status: DC
  Administered 2014-09-25 – 2014-09-29 (×8): 80 mg via ORAL
  Filled 2014-09-25 (×10): qty 4

## 2014-09-25 MED ORDER — TOLVAPTAN 15 MG PO TABS
30.0000 mg | ORAL_TABLET | ORAL | Status: AC
  Administered 2014-09-25: 30 mg via ORAL
  Filled 2014-09-25: qty 2

## 2014-09-25 NOTE — Progress Notes (Signed)
Lab reported critical Sodium level of 119. Will pass on to day shift RN and continue to monitor.

## 2014-09-25 NOTE — Significant Event (Addendum)
Rapid Response Event Note  Overview: Called to see patient who experienced unprotected fall Time Called: 1008 Arrival Time: 1011 Event Type: Other (Comment) (fall)  Initial Focused Assessment:  Staff reports patient got up to go to BR - got tangled in cords and fell backwards - witness saw patient trip - fall backwards - first fell to buttocks then head fell backwards - on arrival patient being stood to chair by staff  And  TRH Dr. Rozanna Box on unit and came to bedside - patient alert - MAE x 4 - small lac to back of head - c/o small amount pain to back of head and neck - lifted to bed.  BP 95/63 HR 78 vpaced - RR 18 unlabored - O2 sats 99% on RA.  Patient in NAD - exam limited by language barrier - speaks no [place].  PERL 3 mm.  Bil strong grips - legs equal strength.  Denies headache.  Mimicks commands.  On Coumadin INR this AM 2.39.     Interventions:  Stat CT head and neck ordered by Dr. Aundra Dubin.  Bedrest for now.  WIll follow as needed.     Event Summary: Name of Physician Notified: Dr. Aundra Dubin at  (pta rrt)    at          [place], Nena Jordan

## 2014-09-25 NOTE — Progress Notes (Signed)
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   Recently discharged on IV lasix 3 times a week M-W-F from [facility].   Had a fall earlier this morning tripped over IV fluid. CT of head ok. dmitted with abdominal pain. Mild headache.   Denies SOB. Denies abdominal pain.   WBC  14.1  Blood Cultures - NGTD.    Na 117 Objective:   Weight Range:  Vital Signs:   Temp:  [97.5 F (36.4 C)-98.2 F (36.8 C)] 97.6 F (36.4 C) (04/07 1153) Pulse Rate:  [66-76] 76 (04/07 1153) Resp:  [16-18] 18 (04/07 1153) BP: (84-108)/(47-70) 97/70 mmHg (04/07 1153) SpO2:  [95 %-100 %] 96 % (04/07 1153) Weight:  [133 lb 3.2 oz (60.419 kg)] 133 lb 3.2 oz (60.419 kg) (04/07 0500) Last BM Date: 09/23/14  Weight change: Filed Weights   09/23/14 0411 09/24/14 0459 09/25/14 0500  Weight: 135 lb 5.8 oz (61.4 kg) 132 lb 1.6 oz (59.92 kg) 133 lb 3.2 oz (60.419 kg)    Intake/Output:   Intake/Output Summary (Last 24 hours) at 09/25/14 1307 Last data filed at 09/25/14 0851  Gross per 24 hour  Intake 1424.22 ml  Output   1075 ml  Net 349.22 ml     Physical Exam: General: Elderly. In bed.  NAD. HEENT: normal except small laceration on the back of his head 2 cm. Min bloody exudate.  Neck: supple. JVP flat.  Carotids 2+ bilaterally; no bruits. No lymphadenopathy or thryomegaly appreciated.  Cor: PMI laterally displaced. Regular rhythm and rate. + s3  Lungs: clear Abdomen: soft, nontender, non distended. No hepatosplenomegaly. No bruits or masses. + bowel sounds. Extremities: no cyanosis, clubbing, rash.  R and LLE 3+ edmema Neuro: alert & orientedx3, cranial nerves grossly intact.  Moves all 4 extremities w/o difficulty. Affect pleasant.  Telemetry:  AV paced   Labs: Basic Metabolic Panel:  Recent Labs Lab 09/21/14 0220 09/22/14 0602 09/23/14 0519  09/23/14 1907 09/24/14 0530 09/24/14 2142 09/25/14 0530 09/25/14 0905  NA 122* 122* 117*  < > 119* 119* 119* 119* 117*  K 5.3* 3.6 2.9*  --   --  2.6*  --   --  2.4*  CL 85* 83* 77*  --   --  76*  --   --  76*  CO2 22 26 27   --   --  29  --   --  25  GLUCOSE 198* 137* 146*  --   --  146*  --   --  238*  BUN 85* 78* 75*  --   --  72*  --   --  70*  CREATININE 2.12* 1.78* 1.76*  --   --  1.62*  --   --  1.73*  CALCIUM 9.2 9.0 9.2  --   --  9.2  --   --  8.4  < > = values in this interval not displayed.  Liver Function Tests: No results for input(s): AST, ALT, ALKPHOS, BILITOT, PROT, ALBUMIN in the last 168 hours. No results for input(s): LIPASE, AMYLASE in the last 168 hours. No results for input(s): AMMONIA in the last 168 hours.  CBC:  Recent Labs Lab  09/21/14 0220 09/23/14 0519 09/24/14 0530 09/25/14 0530  WBC 18.8* 12.5* 12.7* 14.1*  NEUTROABS 16.8*  --   --   --   HGB 11.2* 11.1* 10.9* 10.7*  HCT 34.9* 32.8* 32.6* 32.5*  MCV 79.0 75.8* 76.0* 75.8*  PLT 375 308 341 348    Cardiac Enzymes:  Recent Labs Lab 09/21/14 0220 09/21/14 1916 09/22/14 0107 09/22/14 0602  TROPONINI 0.05* 0.05* 0.06* 0.04*    BNP: BNP (last 3 results)  Recent Labs  09/15/14 0209 09/16/14 0250 09/21/14 0220  BNP 2970.9* 3495.2* 2515.7*    ProBNP (last 3 results)  Recent Labs  03/01/14 1959 03/08/14 2230 06/05/14 0332  PROBNP 11880.0* 15334.0* 19533.0*      Other results:  Imaging: Ct Head Wo Contrast  09/25/2014   CLINICAL DATA:  Status post fall today with a blow to the back of the head. Initial encounter.  EXAM: CT HEAD WITHOUT CONTRAST  CT CERVICAL SPINE WITHOUT CONTRAST  TECHNIQUE: Multidetector CT imaging of the head and cervical spine was performed following the standard protocol  without intravenous contrast. Multiplanar CT image reconstructions of the cervical spine were also generated.  COMPARISON:  Head CT scan 06/11/2012.  FINDINGS: CT HEAD FINDINGS  Cortical atrophy and chronic microvascular ischemic change are identified. There is no evidence of acute intracranial abnormality including hemorrhage, infarct, mass lesion, mass effect, midline shift or abnormal extra-axial fluid collection. No hydrocephalus or pneumocephalus. The calvarium is intact. Imaged paranasal sinuses and mastoid air cells are clear.  CT CERVICAL SPINE FINDINGS  No fracture or malalignment of the cervical spine is identified. Loss of disc space height and endplate spurring appear worst at C5-6 and C6-7. Multilevel facet degenerative change is seen. Right IJ approach central venous catheter and left-side pacing leads are noted.  IMPRESSION: No acute abnormality head or cervical spine.  Atrophy and chronic microvascular ischemic change.  Cervical spondylosis.   Electronically Signed   By: Inge Rise M.D.   On: 09/25/2014 11:13   Ct Cervical Spine Wo Contrast  09/25/2014   CLINICAL DATA:  Status post fall today with a blow to the back of the head. Initial encounter.  EXAM: CT HEAD WITHOUT CONTRAST  CT CERVICAL SPINE WITHOUT CONTRAST  TECHNIQUE: Multidetector CT imaging of the head and cervical spine was performed following the standard protocol without intravenous contrast. Multiplanar CT image reconstructions of the cervical spine were also generated.  COMPARISON:  Head CT scan 06/11/2012.  FINDINGS: CT HEAD FINDINGS  Cortical atrophy and chronic microvascular ischemic change are identified. There is no evidence of acute intracranial abnormality including hemorrhage, infarct, mass lesion, mass effect, midline shift or abnormal extra-axial fluid collection. No hydrocephalus or pneumocephalus. The calvarium is intact. Imaged paranasal sinuses and mastoid air cells are clear.  CT CERVICAL SPINE FINDINGS  No  fracture or malalignment of the cervical spine is identified. Loss of disc space height and endplate spurring appear worst at C5-6 and C6-7. Multilevel facet degenerative change is seen. Right IJ approach central venous catheter and left-side pacing leads are noted.  IMPRESSION: No acute abnormality head or cervical spine.  Atrophy and chronic microvascular ischemic change.  Cervical spondylosis.   Electronically Signed   By: Inge Rise M.D.   On: 09/25/2014 11:13     Medications:     Scheduled Medications: . amiodarone  200 mg Oral Daily  . fentaNYL  25 mcg Intravenous Once  . magnesium oxide  400 mg Oral BID  . metoCLOPramide  10 mg Oral  BID  . polyethylene glycol  17 g Oral Daily  . potassium chloride  40 mEq Oral BID  . sildenafil  20 mg Oral TID  . sodium chloride  10-40 mL Intracatheter Q12H  . sodium chloride  3 mL Intravenous Q12H  . sorbitol  30 mL Oral Daily  . spironolactone  25 mg Oral BID  . tolvaptan  30 mg Oral Q24H  . torsemide  40 mg Oral BID  . warfarin  4 mg Oral ONCE-1800  . Warfarin - Pharmacist Dosing Inpatient   Does not apply q1800    Infusions: . milrinone 0.5 mcg/kg/min (09/25/14 0856)    PRN Medications: sodium chloride, acetaminophen, LORazepam, ondansetron (ZOFRAN) IV, sodium chloride, sodium chloride, traMADol   Assessment   1.  Acute on chronic systolic HF (EF 57%) on home milrinone 2. Acute on CKD, stage III-IV 3. Hyperkalemia 4. PAF in NSR on amio 5. Retroperitoneal mass. 6. hyponatremia 7. PAH 8. Hypocalcemia 9. Acute on chronic respiratory failure 10. DNR 11. Elevated WBC  12. Hyponatremia 13. Hypkalemia 14. Fall -    Plan:   Hyponatremic. Sodium up slightly.262>035>597.  Give tolvaptan 30 mg today. Check BMET every 6 hours x 3.    Volume status trending up. Weight up a pound.  Increase torsemide back up to 80 mg twice a day. Continue spiro 25 mg twice a day. Continue milrinone 0.5 mcg.  Supplement K.   WBC unchanged  14.1. Blood Cultures --final NGTD.      Maintaining SR. Continue amiodarone 200 mg daily. INR 2.39. Pharmacy addressing INR.   Fall this am and hit head. CT of head/Cervical spine.  No acute abnormality noted. Small laceration noted. Place steri strips.   Consult palliative care again for goals of care as we have no options for ongoing end stage heart failure.   Hold discharge to SNF. SW aware.   Length of Stay: 4   CLEGG,[name]  NP-C   09/25/2014, 1:07 PM  Advanced Heart Failure Team Pager [phone] (M-F; [place])  Please contact [facility] for night-coverage after hours (4p -7a ) and weekends on [email or web address]  Patient seen with NP, agree with the above note.  Sodium remains low, did not get tolvaptan yesterday.  Will give tolvaptan today along with torsemide 80 mg bid (weight up).  [place] and hit head today, tripped over IV line.  CT head/c-spine ok.  Poor long-term prognosis.  Will watch today and see how he is doing tomorrow, if stable clinically, to SNF.   [name] 09/25/2014 4:55 PM

## 2014-09-25 NOTE — Progress Notes (Signed)
ANTICOAGULATION CONSULT NOTE - Follow Up Consult  Pharmacy Consult for Coumadin Indication: atrial fibrillation  Allergies  Allergen Reactions  . Lisinopril Rash    ( pt denies this allergy)    Patient Measurements: Height: 5\' 6"  (167.6 cm) (estimated. no speak english) Weight: 133 lb 3.2 oz (60.419 kg) (scale c) IBW/kg (Calculated) : 63.8  Vital Signs: Temp: 97.5 F (36.4 C) (04/07 1031) Temp Source: Oral (04/07 1031) BP: 95/63 mmHg (04/07 1031) Pulse Rate: 73 (04/07 1031)  Labs:  Recent Labs  09/23/14 0519 09/24/14 0530 09/25/14 0530 09/25/14 0905  HGB 11.1* 10.9* 10.7*  --   HCT 32.8* 32.6* 32.5*  --   PLT 308 341 348  --   LABPROT 30.4* 28.5* 26.3*  --   INR 2.88* 2.66* 2.39*  --   CREATININE 1.76* 1.62*  --  1.73*    Estimated Creatinine Clearance: 29.6 mL/min (by C-G formula based on Cr of 1.73).  Assessment: 81yom continues on coumadin for afib. INR is therapeutic at 2.39 but has been trending down over the last several days on his home regimen. CBC stable. No bleeding reported. Continues on amiodarone as PTA.   Home dose: 4mg  MWFSun, 2mg  TTSat  Goal of Therapy:  INR 2-3 Monitor platelets by anticoagulation protocol: Yes   Plan:  1) Will give coumadin 4mg  x 1 today due to decreasing INR, can probably resume home dose tomorrow 2) INR in AM if still here   Deboraha Sprang 09/25/2014,10:40 AM

## 2014-09-25 NOTE — Progress Notes (Signed)
Patient had witnessed fall. He was sitting in recliner at the door. Stood up to walk in room, tripped over some tubing, and fell on the floor. He fell back on his bottom, and then fell,back and hit hi head. Small laceration noted to back of head with scant bleeding. Rapid response called. Staff called to room. Patient assessed. Vital signs taken. Vitals at baseline. Md [name] called ans notified. New roders placed for ct head, cervical ct. Patient complains of pain being 1 on pain scale 1-10.  Pain is noted to back of head.  Denis pain in other areas.  Neuro vital signs stable. Family called by charge nurse, but family does not speak any [place]. Will call family back with translator for  notification of fall.

## 2014-09-25 NOTE — Progress Notes (Signed)
Mrs. Reicher in to visit. Translator phone line used to inform Mrs. Peatross of patient falling this am.

## 2014-09-26 DIAGNOSIS — W19XXXA Unspecified fall, initial encounter: Secondary | ICD-10-CM

## 2014-09-26 LAB — BASIC METABOLIC PANEL
Anion gap: 13 (ref 5–15)
BUN: 64 mg/dL — AB (ref 6–23)
CO2: 27 mmol/L (ref 19–32)
Calcium: 8.9 mg/dL (ref 8.4–10.5)
Chloride: 80 mmol/L — ABNORMAL LOW (ref 96–112)
Creatinine, Ser: 1.5 mg/dL — ABNORMAL HIGH (ref 0.50–1.35)
GFR, EST AFRICAN AMERICAN: 49 mL/min — AB (ref >90)
GFR, EST NON AFRICAN AMERICAN: 43 mL/min — AB (ref >90)
Glucose, Bld: 139 mg/dL — ABNORMAL HIGH (ref 70–99)
Potassium: 2.8 mmol/L — ABNORMAL LOW (ref 3.5–5.1)
Sodium: 120 mmol/L — ABNORMAL LOW (ref 135–145)

## 2014-09-26 LAB — CBC
HCT: 31.5 % — ABNORMAL LOW (ref 39.0–52.0)
Hemoglobin: 10.3 g/dL — ABNORMAL LOW (ref 13.0–17.0)
MCH: 24.9 pg — AB (ref 26.0–34.0)
MCHC: 32.7 g/dL (ref 30.0–36.0)
MCV: 76.3 fL — ABNORMAL LOW (ref 78.0–100.0)
Platelets: 329 10*3/uL (ref 150–400)
RBC: 4.13 MIL/uL — ABNORMAL LOW (ref 4.22–5.81)
RDW: 17.2 % — ABNORMAL HIGH (ref 11.5–15.5)
WBC: 16 10*3/uL — ABNORMAL HIGH (ref 4.0–10.5)

## 2014-09-26 LAB — PROTIME-INR
INR: 2.57 — ABNORMAL HIGH (ref 0.00–1.49)
Prothrombin Time: 27.8 seconds — ABNORMAL HIGH (ref 11.6–15.2)

## 2014-09-26 MED ORDER — WARFARIN SODIUM 4 MG PO TABS
4.0000 mg | ORAL_TABLET | Freq: Once | ORAL | Status: AC
  Administered 2014-09-26: 4 mg via ORAL
  Filled 2014-09-26: qty 1

## 2014-09-26 NOTE — Progress Notes (Signed)
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   Recently discharged on IV lasix 3 times a week M-W-F from [facility].   Had a fall 4/7 after he tripped over IV fluid. CT of head ok. Weight up 2 pounds.   Denies SOB. Denies abdominal pain.   WBC  14.1>16  Blood Cultures - NGTD.    Na 117> 120  Objective:   Weight Range:  Vital Signs:   Temp:  [97.5 F (36.4 C)-98.1 F (36.7 C)] 98.1 F (36.7 C) (04/08 0531) Pulse Rate:  [71-78] 71 (04/08 0531) Resp:  [17-18] 18 (04/08 0531) BP: (89-97)/(40-70) 89/46 mmHg (04/08 0900) SpO2:  [95 %-100 %] 99 % (04/08 0531) Weight:  [135 lb 3.2 oz (61.326 kg)] 135 lb 3.2 oz (61.326 kg) (04/08 0531) Last BM Date: 09/23/14  Weight change: Filed Weights   09/24/14 0459 09/25/14 0500 09/26/14 0531  Weight: 132 lb 1.6 oz (59.92 kg) 133 lb 3.2 oz (60.419 kg) 135 lb 3.2 oz (61.326 kg)    Intake/Output:   Intake/Output Summary (Last 24 hours) at 09/26/14 1021 Last data filed at 09/26/14 1000  Gross per 24 hour  Intake 1653.6 ml  Output   1525 ml  Net  128.6 ml     Physical Exam: General: Elderly. Sitting on the side of the bed.   NAD. HEENT: normal except small laceration on the back of his head 2 cm. Min bloody exudate.  Neck: supple. JVP 7-8  Carotids 2+ bilaterally; no bruits. No lymphadenopathy or thryomegaly appreciated.  Cor: PMI laterally displaced. Regular rhythm and rate. + s3  Lungs: clear Abdomen: soft, nontender, non distended. No hepatosplenomegaly. No bruits or masses. + bowel sounds. Extremities: no cyanosis, clubbing, rash.  R and LLE 3+ edmema Neuro: alert & orientedx3, cranial nerves grossly intact.  Moves all 4 extremities w/o difficulty. Affect pleasant.  Telemetry:  AV paced   Labs: Basic Metabolic Panel:  Recent Labs Lab 09/22/14 0602 09/23/14 0519  09/24/14 0530 09/24/14 2142 09/25/14 0530 09/25/14 0905 09/26/14 0444  NA 122* 117*  < > 119* 119* 119* 117* 120*  K 3.6 2.9*  --  2.6*  --   --  2.4* 2.8*  CL 83* 77*  --  76*  --   --  76* 80*  CO2 26 27  --  29  --   --  25 27  GLUCOSE 137* 146*  --  146*  --   --  238* 139*  BUN 78* 75*  --  72*  --   --  70* 64*  CREATININE 1.78* 1.76*  --  1.62*  --   --  1.73* 1.50*  CALCIUM 9.0 9.2  --  9.2  --   --  8.4 8.9  < > = values in this interval not displayed.  Liver Function Tests: No results for input(s): AST, ALT, ALKPHOS, BILITOT, PROT, ALBUMIN in the last 168 hours. No results for input(s): LIPASE, AMYLASE in the last 168 hours. No results for input(s): AMMONIA in the last 168 hours.  CBC:  Recent Labs Lab 09/21/14 0220 09/23/14 0519 09/24/14 0530 09/25/14 0530 09/26/14 0444  WBC 18.8* 12.5* 12.7* 14.1* 16.0*  NEUTROABS 16.8*  --   --   --   --   HGB 11.2* 11.1* 10.9* 10.7* 10.3*  HCT 34.9* 32.8* 32.6* 32.5* 31.5*  MCV 79.0 75.8* 76.0* 75.8* 76.3*  PLT 375 308 341 348 329    Cardiac Enzymes:  Recent Labs Lab 09/21/14 0220 09/21/14 1916 09/22/14 0107 09/22/14 0602  TROPONINI 0.05* 0.05* 0.06* 0.04*    BNP: BNP (last 3 results)  Recent Labs  09/15/14 0209 09/16/14 0250 09/21/14 0220  BNP 2970.9* 3495.2* 2515.7*    ProBNP (last 3 results)  Recent Labs  03/01/14 1959 03/08/14 2230 06/05/14 0332  PROBNP 11880.0* 15334.0* 19533.0*      Other results:  Imaging: Ct Head Wo Contrast  09/25/2014   CLINICAL DATA:  Status post fall today with a blow to the back of the head. Initial encounter.  EXAM: CT HEAD WITHOUT CONTRAST  CT CERVICAL SPINE WITHOUT CONTRAST  TECHNIQUE: Multidetector CT imaging of the head and cervical spine was performed following the standard protocol without  intravenous contrast. Multiplanar CT image reconstructions of the cervical spine were also generated.  COMPARISON:  Head CT scan 06/11/2012.  FINDINGS: CT HEAD FINDINGS  Cortical atrophy and chronic microvascular ischemic change are identified. There is no evidence of acute intracranial abnormality including hemorrhage, infarct, mass lesion, mass effect, midline shift or abnormal extra-axial fluid collection. No hydrocephalus or pneumocephalus. The calvarium is intact. Imaged paranasal sinuses and mastoid air cells are clear.  CT CERVICAL SPINE FINDINGS  No fracture or malalignment of the cervical spine is identified. Loss of disc space height and endplate spurring appear worst at C5-6 and C6-7. Multilevel facet degenerative change is seen. Right IJ approach central venous catheter and left-side pacing leads are noted.  IMPRESSION: No acute abnormality head or cervical spine.  Atrophy and chronic microvascular ischemic change.  Cervical spondylosis.   Electronically Signed   By: Inge Rise M.D.   On: 09/25/2014 11:13   Ct Cervical Spine Wo Contrast  09/25/2014   CLINICAL DATA:  Status post fall today with a blow to the back of the head. Initial encounter.  EXAM: CT HEAD WITHOUT CONTRAST  CT CERVICAL SPINE WITHOUT CONTRAST  TECHNIQUE: Multidetector CT imaging of the head and cervical spine was performed following the standard protocol without intravenous contrast. Multiplanar CT image reconstructions of the cervical spine were also generated.  COMPARISON:  Head CT scan 06/11/2012.  FINDINGS: CT HEAD FINDINGS  Cortical atrophy and chronic microvascular ischemic change are identified. There is no evidence of acute intracranial abnormality including hemorrhage, infarct, mass lesion, mass effect, midline shift or abnormal extra-axial fluid collection. No hydrocephalus or pneumocephalus. The calvarium is intact. Imaged paranasal sinuses and mastoid air cells are clear.  CT CERVICAL SPINE FINDINGS  No fracture or  malalignment of the cervical spine is identified. Loss of disc space height and endplate spurring appear worst at C5-6 and C6-7. Multilevel facet degenerative change is seen. Right IJ approach central venous catheter and left-side pacing leads are noted.  IMPRESSION: No acute abnormality head or cervical spine.  Atrophy and chronic microvascular ischemic change.  Cervical spondylosis.   Electronically Signed   By: Inge Rise M.D.   On: 09/25/2014 11:13     Medications:     Scheduled Medications: . amiodarone  200 mg Oral Daily  . fentaNYL  25 mcg Intravenous Once  . magnesium oxide  400 mg Oral BID  . metoCLOPramide  10 mg Oral BID  . polyethylene glycol  17 g  Oral Daily  . potassium chloride  40 mEq Oral TID  . sildenafil  20 mg Oral TID  . sodium chloride  10-40 mL Intracatheter Q12H  . sodium chloride  3 mL Intravenous Q12H  . sorbitol  30 mL Oral Daily  . spironolactone  25 mg Oral BID  . torsemide  80 mg Oral BID  . Warfarin - Pharmacist Dosing Inpatient   Does not apply q1800    Infusions: . milrinone 0.5 mcg/kg/min (09/26/14 1000)    PRN Medications: sodium chloride, acetaminophen, LORazepam, ondansetron (ZOFRAN) IV, sodium chloride, sodium chloride, traMADol   Assessment   1.  Acute on chronic systolic HF (EF 11%) on home milrinone 2. Acute on CKD, stage III-IV 3. Hyperkalemia 4. PAF in NSR on amio 5. Retroperitoneal mass. 6. hyponatremia 7. PAH 8. Hypocalcemia 9. Acute on chronic respiratory failure 10. DNR 11. Elevated WBC  12. Hyponatremia 13. Hypokalemia 14. Fall 09/25/2014- CT of head negative.     Plan:   Hyponatremic. Sodium up slightly.117>120.    Volume status ok. Continue current diuretic regimen.  Continue milrinone 0.5 mcg.  Supplement K.   WBC unchanged 14.1>16. Marland Kitchen Blood Cultures --final NGTD.      Maintaining SR. Continue amiodarone 200 mg daily. INR 2.57 Pharmacy addressing INR.   Fall on 4/7--.hit head. CT of head/Cervical spine.  No  acute abnormality noted. Small laceration noted. Place steri strips.   Consult palliative care again for goals of care as we have no options for ongoing end stage heart failure.   Hold discharge to SNF. SW aware.   Length of Stay: 5   CLEGG,[name]  NP-C   09/26/2014, 10:21 AM  Advanced Heart Failure Team Pager [phone] (M-F; [place])  Please contact [facility] for night-coverage after hours (4p -7a ) and weekends on [email or web address]  Patient seen and examined with Darrick Grinder, NP. We discussed all aspects of the encounter. I agree with the assessment and plan as stated above.   Sodium and volume status look ok. Tolvaptan has been stopped. Will monitor serum sodium over weekend. If stable off tolvaptan he can go to SNF on Monday. Would not give further doses of Tolvaptan. Can give IV lasix as needed to supplement oral torsemide. Continues to refuse Hospice care.   Ebon Ketchum,MD 11:41 AM

## 2014-09-26 NOTE — Progress Notes (Signed)
ANTICOAGULATION CONSULT NOTE - Follow Up Consult  Pharmacy Consult for Coumadin Indication: atrial fibrillation  Allergies  Allergen Reactions  . Lisinopril Rash    ( pt denies this allergy)    Patient Measurements: Height: 5\' 6"  (167.6 cm) (estimated. no speak english) Weight: 135 lb 3.2 oz (61.326 kg) IBW/kg (Calculated) : 63.8  Vital Signs: Temp: 98.1 F (36.7 C) (04/08 0531) Temp Source: Oral (04/08 0531) BP: 104/49 mmHg (04/08 0900) Pulse Rate: 71 (04/08 0531)  Labs:  Recent Labs  09/24/14 0530 09/25/14 0530 09/25/14 0905 09/26/14 0444  HGB 10.9* 10.7*  --  10.3*  HCT 32.6* 32.5*  --  31.5*  PLT 341 348  --  329  LABPROT 28.5* 26.3*  --  27.8*  INR 2.66* 2.39*  --  2.57*  CREATININE 1.62*  --  1.73* 1.50*    Estimated Creatinine Clearance: 34.6 mL/min (by C-G formula based on Cr of 1.5).  Assessment: 81yom continues on coumadin for afib. INR is therapeutic at 2.57 after higher dose given yesterday since his INR was trending down. CBC stable. No bleeding reported. Continues on amiodarone as PTA.   Home dose: 4mg  MWFSun, 2mg  TTSat  Goal of Therapy:  INR 2-3 Monitor platelets by anticoagulation protocol: Yes   Plan:  1) Will resume his usual regimen and give 4mg  x 1 tonight 2) INR in AM   Deboraha Sprang 09/26/2014,1:09 PM

## 2014-09-26 NOTE — Progress Notes (Signed)
PT Cancellation Note  Patient Details Name: Tyler Christensen MRN: [id number] DOB: June 28, 1932   Cancelled Treatment:    Reason Eval/Treat Not Completed: Medical issues which prohibited therapy (pt with hypokalemia and hyponatremia not yet repleted)   Lanetta Inch Beth 09/26/2014, 8:46 AM Elwyn Reach, [place]

## 2014-09-26 NOTE — Evaluation (Signed)
Physical Therapy Evaluation Patient Details Name: Tyler Christensen MRN: [id number] DOB: 02-11-1933 Today's Date: 09/26/2014   History of Present Illness  Tyler Christensen is a 81 y.o. male [name] w/ PMHx significant for chronic systolic CHF (EF 10%), NICM, LBBB, PAF (on coumadin), HTN, HLD, CVA, Pulmonary Hypertension and h/o seizure d/o (2/2 CVA 05/2012). Has had numerous admissions recently for decompensated HF (last 3/13-3/25, 3/29-3/31) He presented again 4/3 with SOB and CP. Pt with fall in hospital 4/7. Hyponatremia and hypokalemia this admission  Clinical Impression  Pt pleasant with interpreter phone utilized throughout session. Pt with impaired cognition, disoriented to time and situation with impaired history. Pt with frequent readmissions, inability to care for himself, impaired balance, and decreased family support who will benefit from acute therapy to maximize mobility, balance and function with recommendation for SNF for physical therapy and medical management.    Follow Up Recommendations SNF;Supervision/Assistance - 24 hour    Equipment Recommendations  Rolling walker with 5" wheels    Recommendations for Other Services       Precautions / Restrictions Precautions Precautions: Fall      Mobility  Bed Mobility Overal bed mobility: Modified Independent                Transfers Overall transfer level: Needs assistance   Transfers: Sit to/from Stand Sit to Stand: Min guard         General transfer comment: cues for hand placement, safety and backing fully to surface  Ambulation/Gait Ambulation/Gait assistance: Min assist Ambulation Distance (Feet): 120 Feet Assistive device: Rolling walker (2 wheeled) Gait Pattern/deviations: Step-through pattern;Decreased stride length;Trunk flexed   Gait velocity interpretation: Below normal speed for age/gender General Gait Details: cues for posture, position in RW and directional cues to room, no SOB with  gait  Stairs            Wheelchair Mobility    Modified Rankin (Stroke Patients Only)       Balance Overall balance assessment: Needs assistance;History of Falls   Sitting balance-Leahy Scale: Fair       Standing balance-Leahy Scale: Poor                               Pertinent Vitals/Pain Pain Assessment: No/denies pain    Home Living Family/patient expects to be discharged to:: Skilled nursing facility Living Arrangements: Children;Spouse/significant other Available Help at Discharge: Family Type of Home: House Home Access: Level entry     Home Layout: One level Home Equipment: Cane - single point Additional Comments: In one instance pt stated he has had 2 falls and later stated no falls    Prior Function           Comments: via interpreter pt stating he stands in the shower, bathes and dresses himself with someone in the home 24hrs/day     Hand Dominance        Extremity/Trunk Assessment   Upper Extremity Assessment: Generalized weakness           Lower Extremity Assessment: Generalized weakness      Cervical / Trunk Assessment: Normal  Communication   Communication: Prefers language other than English  Cognition Arousal/Alertness: Awake/alert Behavior During Therapy: Flat affect Overall Cognitive Status: No family/caregiver present to determine baseline cognitive functioning                      General Comments      Exercises  Assessment/Plan    PT Assessment Patient needs continued PT services  PT Diagnosis Difficulty walking;Generalized weakness;Altered mental status   PT Problem List Decreased strength;Decreased balance;Decreased activity tolerance;Decreased mobility;Decreased range of motion;Decreased knowledge of use of DME;Cardiopulmonary status limiting activity  PT Treatment Interventions DME instruction;Gait training;Functional mobility training;Therapeutic activities;Therapeutic  exercise;Balance training;Patient/family education   PT Goals (Current goals can be found in the Care Plan section) Acute Rehab PT Goals Patient Stated Goal: none stated PT Goal Formulation: With patient Time For Goal Achievement: 10/10/14 Potential to Achieve Goals: Good    Frequency Min 2X/week   Barriers to discharge Decreased caregiver support family apparently in home but with pt having frequent readmissions unable to provide pt needed level of care    Co-evaluation               End of Session Equipment Utilized During Treatment: Gait belt Activity Tolerance: Patient tolerated treatment well Patient left: in chair;with call bell/phone within reach;with chair alarm set Nurse Communication: Mobility status;Precautions         Time: 1610-9604 PT Time Calculation (min) (ACUTE ONLY): 17 min   Charges:   PT Evaluation $Initial PT Evaluation Tier I: 1 Procedure     PT G CodesMelford Aase 09/26/2014, 1:52 PM Elwyn Reach, [place]

## 2014-09-27 LAB — PROTIME-INR
INR: 2.89 — AB (ref 0.00–1.49)
Prothrombin Time: 30.5 seconds — ABNORMAL HIGH (ref 11.6–15.2)

## 2014-09-27 LAB — BASIC METABOLIC PANEL
ANION GAP: 14 (ref 5–15)
BUN: 62 mg/dL — ABNORMAL HIGH (ref 6–23)
CO2: 28 mmol/L (ref 19–32)
CREATININE: 1.38 mg/dL — AB (ref 0.50–1.35)
Calcium: 8.4 mg/dL (ref 8.4–10.5)
Chloride: 78 mmol/L — ABNORMAL LOW (ref 96–112)
GFR calc non Af Amer: 47 mL/min — ABNORMAL LOW (ref >90)
GFR, EST AFRICAN AMERICAN: 55 mL/min — AB (ref >90)
Glucose, Bld: 117 mg/dL — ABNORMAL HIGH (ref 70–99)
Potassium: 2.6 mmol/L — CL (ref 3.5–5.1)
Sodium: 120 mmol/L — ABNORMAL LOW (ref 135–145)

## 2014-09-27 LAB — CBC
HCT: 30.5 % — ABNORMAL LOW (ref 39.0–52.0)
HEMOGLOBIN: 10.2 g/dL — AB (ref 13.0–17.0)
MCH: 25.8 pg — ABNORMAL LOW (ref 26.0–34.0)
MCHC: 33.4 g/dL (ref 30.0–36.0)
MCV: 77 fL — ABNORMAL LOW (ref 78.0–100.0)
Platelets: 311 10*3/uL (ref 150–400)
RBC: 3.96 MIL/uL — ABNORMAL LOW (ref 4.22–5.81)
RDW: 17.3 % — ABNORMAL HIGH (ref 11.5–15.5)
WBC: 14.2 10*3/uL — ABNORMAL HIGH (ref 4.0–10.5)

## 2014-09-27 LAB — CARBOXYHEMOGLOBIN
CARBOXYHEMOGLOBIN: 1.1 % (ref 0.5–1.5)
Methemoglobin: 0.9 % (ref 0.0–1.5)
O2 SAT: 49.3 %
TOTAL HEMOGLOBIN: 10.3 g/dL — AB (ref 13.5–18.0)

## 2014-09-27 MED ORDER — POTASSIUM CHLORIDE CRYS ER 20 MEQ PO TBCR
40.0000 meq | EXTENDED_RELEASE_TABLET | Freq: Once | ORAL | Status: AC
  Administered 2014-09-27: 40 meq via ORAL

## 2014-09-27 MED ORDER — WARFARIN SODIUM 2 MG PO TABS
2.0000 mg | ORAL_TABLET | Freq: Once | ORAL | Status: AC
  Administered 2014-09-27: 2 mg via ORAL
  Filled 2014-09-27: qty 1

## 2014-09-27 MED ORDER — POTASSIUM CHLORIDE CRYS ER 20 MEQ PO TBCR
40.0000 meq | EXTENDED_RELEASE_TABLET | Freq: Four times a day (QID) | ORAL | Status: DC
  Administered 2014-09-27 – 2014-09-28 (×5): 40 meq via ORAL
  Filled 2014-09-27 (×6): qty 2

## 2014-09-27 NOTE — Progress Notes (Signed)
ANTICOAGULATION CONSULT NOTE - Follow Up Consult  Pharmacy Consult for Coumadin Indication: atrial fibrillation  Allergies  Allergen Reactions  . Lisinopril Rash    ( pt denies this allergy)    Patient Measurements: Height: 5\' 6"  (167.6 cm) (estimated. no speak english) Weight: 132 lb 12.8 oz (60.238 kg) (Scale B) IBW/kg (Calculated) : 63.8  Vital Signs: Temp: 98.3 F (36.8 C) (04/09 0429) Temp Source: Oral (04/09 0429) BP: 92/57 mmHg (04/09 0429) Pulse Rate: 75 (04/09 0429)  Labs:  Recent Labs  09/25/14 0530 09/25/14 0905 09/26/14 0444 09/27/14 0445  HGB 10.7*  --  10.3* 10.2*  HCT 32.5*  --  31.5* 30.5*  PLT 348  --  329 311  LABPROT 26.3*  --  27.8* 30.5*  INR 2.39*  --  2.57* 2.89*  CREATININE  --  1.73* 1.50* 1.38*    Estimated Creatinine Clearance: 37 mL/min (by C-G formula based on Cr of 1.38).  Assessment: 81yom continues on coumadin for afib. INR is therapeutic at 2.89 (INR trend up). CBC stable. No bleeding reported. Continues on amiodarone as PTA.   Home dose: 4mg  MWFSun, 2mg  TTSat  Goal of Therapy:  INR 2-3 Monitor platelets by anticoagulation protocol: Yes   Plan:  -Coumadin 2mg  po today -Daily PT/INR for now  Hildred Laser, Pharm D 09/27/2014 8:44 AM

## 2014-09-27 NOTE — Progress Notes (Signed)
Mr Tyler Christensen got angry because I told him to remain in his bed. He wanted to sit in the hallway. I explained that he had a fall a few days ago. He refused to leave the hallway. Dr notified, Ativan given and sitter ordered. I also explained to his wife when she came to visit.

## 2014-09-27 NOTE — Progress Notes (Signed)
Patient Name: Tyler Christensen Date of Encounter: 09/27/2014     Active Problems:   Palliative care encounter   Acute on chronic systolic CHF (congestive heart failure)   Dyspnea   Decreased appetite    SUBJECTIVE  No complaints today. Eating well according to nursing staff. Denies dolor. Rhythm NSR with paced ventricular.  CURRENT MEDS . amiodarone  200 mg Oral Daily  . fentaNYL  25 mcg Intravenous Once  . magnesium oxide  400 mg Oral BID  . metoCLOPramide  10 mg Oral BID  . polyethylene glycol  17 g Oral Daily  . potassium chloride  40 mEq Oral QID  . sildenafil  20 mg Oral TID  . sodium chloride  10-40 mL Intracatheter Q12H  . sodium chloride  3 mL Intravenous Q12H  . sorbitol  30 mL Oral Daily  . spironolactone  25 mg Oral BID  . torsemide  80 mg Oral BID  . warfarin  2 mg Oral ONCE-1800  . Warfarin - Pharmacist Dosing Inpatient   Does not apply q1800    OBJECTIVE  Filed Vitals:   09/26/14 2205 09/27/14 0429 09/27/14 0950 09/27/14 0951  BP: 97/52 92/57 81/46  95/52  Pulse: 75 75 71 75  Temp: 97.5 F (36.4 C) 98.3 F (36.8 C)    TempSrc: Oral Oral    Resp: 18 18    Height:      Weight:  132 lb 12.8 oz (60.238 kg)    SpO2: 97% 100%      Intake/Output Summary (Last 24 hours) at 09/27/14 1225 Last data filed at 09/27/14 1018  Gross per 24 hour  Intake 2740.2 ml  Output   1065 ml  Net 1675.2 ml   Filed Weights   09/25/14 0500 09/26/14 0531 09/27/14 0429  Weight: 133 lb 3.2 oz (60.419 kg) 135 lb 3.2 oz (61.326 kg) 132 lb 12.8 oz (60.238 kg)    PHYSICAL EXAM  General: Pleasant, NAD. Lying comfortably on right side. No orthopnea. Neuro:  Moves all extremities spontaneously. Psych: Normal affect. HEENT:  Normal  Neck: Supple without bruits or JVD. Lungs:  Resp regular and unlabored, CTA. Heart: RRR no s3, s4, or murmurs. Abdomen: Soft, non-tender, non-distended, BS + x 4.  Extremities: No clubbing, cyanosis or edema. DP/PT/Radials 2+ and equal  bilaterally.  Accessory Clinical Findings  CBC  Recent Labs  09/26/14 0444 09/27/14 0445  WBC 16.0* 14.2*  HGB 10.3* 10.2*  HCT 31.5* 30.5*  MCV 76.3* 77.0*  PLT 329 767   Basic Metabolic Panel  Recent Labs  09/26/14 0444 09/27/14 0445  NA 120* 120*  K 2.8* 2.6*  CL 80* 78*  CO2 27 28  GLUCOSE 139* 117*  BUN 64* 62*  CREATININE 1.50* 1.38*  CALCIUM 8.9 8.4   Liver Function Tests No results for input(s): AST, ALT, ALKPHOS, BILITOT, PROT, ALBUMIN in the last 72 hours. No results for input(s): LIPASE, AMYLASE in the last 72 hours. Cardiac Enzymes No results for input(s): CKTOTAL, CKMB, CKMBINDEX, TROPONINI in the last 72 hours. BNP Invalid input(s): POCBNP D-Dimer No results for input(s): DDIMER in the last 72 hours. Hemoglobin A1C No results for input(s): HGBA1C in the last 72 hours. Fasting Lipid Panel No results for input(s): CHOL, HDL, LDLCALC, TRIG, CHOLHDL, LDLDIRECT in the last 72 hours. Thyroid Function Tests No results for input(s): TSH, T4TOTAL, T3FREE, THYROIDAB in the last 72 hours.  Invalid input(s): FREET3  TELE  AS-VP  ECG    Radiology/Studies  Dg Chest 2  View  09/21/2014   CLINICAL DATA:  Chest pain and shortness of breath beginning yesterday. History of cardiac catheterization.  EXAM: CHEST  2 VIEW  COMPARISON:  Chest radiograph September 16, 2014  FINDINGS: The cardiac silhouette is moderately enlarged, unchanged. Similar pulmonary vascular congestion. Calcified aortic knob. Bibasilar strandy densities with mildly elevated RIGHT hemidiaphragm. No floor pleural effusion or focal consolidation. No pneumothorax.  Tunneled hit vein catheter via RIGHT internal jugular central venous approach with distal tip projecting in distal superior vena cava. LEFT cardiac defibrillator in situ. Patient is osteopenic.  IMPRESSION: Stable cardiomegaly and pulmonary vascular congestion. Bibasilar atelectasis.   Electronically Signed   By: Elon Alas   On:  09/21/2014 06:38   Dg Chest 2 View  09/16/2014   CLINICAL DATA:  Acute onset of shortness breath and leg swelling. Initial encounter.  EXAM: CHEST  2 VIEW  COMPARISON:  Chest radiograph performed 09/15/2014  FINDINGS: The lungs are well-aerated. Vascular congestion is noted. Mild bibasilar opacities appear mildly worsened, raising concern for mildly worsened interstitial edema. No definite pleural effusion or pneumothorax is seen.  The heart is enlarged. A pacemaker/AICD is noted at the left chest wall, with leads ending at the right atrium, right ventricle and coronary sinus. A right-sided catheter is noted ending within the proximal right atrium. No acute osseous abnormalities are seen.  IMPRESSION: Vascular congestion and cardiomegaly. Mild bibasilar airspace opacities appear mildly worsened, raising concern for mildly worsened interstitial edema.   Electronically Signed   By: Garald Balding M.D.   On: 09/16/2014 03:32   Dg Chest 2 View  09/15/2014   CLINICAL DATA:  Increasing shortness of breath.  Chest pain.  EXAM: CHEST  2 VIEW  COMPARISON:  08/31/2014  FINDINGS: Multi lead left-sided pacemaker remains in place. Tip of the right central line in the atrial caval junction. Cardiomegaly is unchanged. Pulmonary edema is unchanged. Trace bilateral pleural effusions. No confluent airspace disease. No pneumothorax.  IMPRESSION: Cardiomegaly and pulmonary edema, unchanged from prior exam. There are trace bilateral pleural effusions.   Electronically Signed   By: Jeb Levering M.D.   On: 09/15/2014 02:30   Ct Head Wo Contrast  09/25/2014   CLINICAL DATA:  Status post fall today with a blow to the back of the head. Initial encounter.  EXAM: CT HEAD WITHOUT CONTRAST  CT CERVICAL SPINE WITHOUT CONTRAST  TECHNIQUE: Multidetector CT imaging of the head and cervical spine was performed following the standard protocol without intravenous contrast. Multiplanar CT image reconstructions of the cervical spine were also  generated.  COMPARISON:  Head CT scan 06/11/2012.  FINDINGS: CT HEAD FINDINGS  Cortical atrophy and chronic microvascular ischemic change are identified. There is no evidence of acute intracranial abnormality including hemorrhage, infarct, mass lesion, mass effect, midline shift or abnormal extra-axial fluid collection. No hydrocephalus or pneumocephalus. The calvarium is intact. Imaged paranasal sinuses and mastoid air cells are clear.  CT CERVICAL SPINE FINDINGS  No fracture or malalignment of the cervical spine is identified. Loss of disc space height and endplate spurring appear worst at C5-6 and C6-7. Multilevel facet degenerative change is seen. Right IJ approach central venous catheter and left-side pacing leads are noted.  IMPRESSION: No acute abnormality head or cervical spine.  Atrophy and chronic microvascular ischemic change.  Cervical spondylosis.   Electronically Signed   By: Inge Rise M.D.   On: 09/25/2014 11:13   Ct Cervical Spine Wo Contrast  09/25/2014   CLINICAL DATA:  Status post fall  today with a blow to the back of the head. Initial encounter.  EXAM: CT HEAD WITHOUT CONTRAST  CT CERVICAL SPINE WITHOUT CONTRAST  TECHNIQUE: Multidetector CT imaging of the head and cervical spine was performed following the standard protocol without intravenous contrast. Multiplanar CT image reconstructions of the cervical spine were also generated.  COMPARISON:  Head CT scan 06/11/2012.  FINDINGS: CT HEAD FINDINGS  Cortical atrophy and chronic microvascular ischemic change are identified. There is no evidence of acute intracranial abnormality including hemorrhage, infarct, mass lesion, mass effect, midline shift or abnormal extra-axial fluid collection. No hydrocephalus or pneumocephalus. The calvarium is intact. Imaged paranasal sinuses and mastoid air cells are clear.  CT CERVICAL SPINE FINDINGS  No fracture or malalignment of the cervical spine is identified. Loss of disc space height and endplate  spurring appear worst at C5-6 and C6-7. Multilevel facet degenerative change is seen. Right IJ approach central venous catheter and left-side pacing leads are noted.  IMPRESSION: No acute abnormality head or cervical spine.  Atrophy and chronic microvascular ischemic change.  Cervical spondylosis.   Electronically Signed   By: Inge Rise M.D.   On: 09/25/2014 11:13   Dg Chest Portable 1 View  08/31/2014   CLINICAL DATA:  Chest pain  EXAM: PORTABLE CHEST - 1 VIEW  COMPARISON:  08/29/2014  FINDINGS: Right jugular central line extends to the right atrium. There are intact appearances of the transvenous leads. There is unchanged cardiomegaly and aortic tortuosity. There is partial clearance of alveolar edema compared to the prior study.  IMPRESSION: Improved, with partial clearance of edema since 08/29/2014. Unchanged cardiomegaly.   Electronically Signed   By: Andreas Newport M.D.   On: 08/31/2014 23:14   Dg Chest Port 1 View  08/29/2014   CLINICAL DATA:  Chest pain.  EXAM: PORTABLE CHEST - 1 VIEW  COMPARISON:  08/19/2014  FINDINGS: Dual lead left-sided pacemaker remains in place. Tip of the right central line in the SVC. Cardiomegaly is unchanged. Pulmonary edema is mildly progressed. No large pleural effusion or pneumothorax. Unchanged osseous structures.  IMPRESSION: Progressive pulmonary edema from prior.  Cardiomegaly is unchanged.   Electronically Signed   By: Jeb Levering M.D.   On: 08/29/2014 04:53   Dg Abd Portable 1v  09/02/2014   CLINICAL DATA:  Acute abdominal pain.  EXAM: PORTABLE ABDOMEN - 1 VIEW  COMPARISON:  Abdomen and pelvis CT obtained on 07/05/2014.  FINDINGS: Borderline dilated small bowel loops. No gross free peritoneal air. Lumbar and lower thoracic spine degenerative changes. Cardiac pacer and AICD leads.  IMPRESSION: Minimal small bowel ileus or partial obstruction.   Electronically Signed   By: Claudie Revering M.D.   On: 09/02/2014 11:15    ASSESSMENT AND PLAN 1. Acute on  chronic systolic heart failure.  EF 20%  2. Acute on chronic kidney disease. 3. PAF holding NSR on amiodarone. 4. DNR  Plan: continue current meds. Probable DC Monday to SNF   Signed, Darlin Coco MD

## 2014-09-27 NOTE — Progress Notes (Signed)
Left arm BP 81/46, HR 71.  Right arm BP 95/52, HR 75.  Per Dr. Kathlen Mody, ok to admin scheduled AM meds amio 200mg  PO, and Torsemide 80mg  PO.

## 2014-09-28 LAB — PROTIME-INR
INR: 2.89 — ABNORMAL HIGH (ref 0.00–1.49)
Prothrombin Time: 30.5 seconds — ABNORMAL HIGH (ref 11.6–15.2)

## 2014-09-28 LAB — CBC
HCT: 30.7 % — ABNORMAL LOW (ref 39.0–52.0)
Hemoglobin: 10.4 g/dL — ABNORMAL LOW (ref 13.0–17.0)
MCH: 25.8 pg — ABNORMAL LOW (ref 26.0–34.0)
MCHC: 33.9 g/dL (ref 30.0–36.0)
MCV: 76.2 fL — AB (ref 78.0–100.0)
PLATELETS: 297 10*3/uL (ref 150–400)
RBC: 4.03 MIL/uL — ABNORMAL LOW (ref 4.22–5.81)
RDW: 17.3 % — ABNORMAL HIGH (ref 11.5–15.5)
WBC: 14.1 10*3/uL — ABNORMAL HIGH (ref 4.0–10.5)

## 2014-09-28 LAB — BASIC METABOLIC PANEL
ANION GAP: 12 (ref 5–15)
BUN: 52 mg/dL — ABNORMAL HIGH (ref 6–23)
CO2: 27 mmol/L (ref 19–32)
Calcium: 8.5 mg/dL (ref 8.4–10.5)
Chloride: 81 mmol/L — ABNORMAL LOW (ref 96–112)
Creatinine, Ser: 1.34 mg/dL (ref 0.50–1.35)
GFR calc non Af Amer: 49 mL/min — ABNORMAL LOW (ref >90)
GFR, EST AFRICAN AMERICAN: 56 mL/min — AB (ref >90)
GLUCOSE: 132 mg/dL — AB (ref 70–99)
Potassium: 2.5 mmol/L — CL (ref 3.5–5.1)
Sodium: 120 mmol/L — ABNORMAL LOW (ref 135–145)

## 2014-09-28 LAB — CULTURE, BLOOD (ROUTINE X 2)
CULTURE: NO GROWTH
Culture: NO GROWTH

## 2014-09-28 MED ORDER — POTASSIUM CHLORIDE CRYS ER 20 MEQ PO TBCR: 60.0000 meq | EXTENDED_RELEASE_TABLET | Freq: Four times a day (QID) | ORAL | Status: DC

## 2014-09-28 MED ORDER — POTASSIUM CHLORIDE CRYS ER 20 MEQ PO TBCR
40.0000 meq | EXTENDED_RELEASE_TABLET | Freq: Three times a day (TID) | ORAL | Status: DC
  Administered 2014-09-28 – 2014-09-29 (×4): 40 meq via ORAL
  Filled 2014-09-28 (×4): qty 2

## 2014-09-28 MED ORDER — WARFARIN SODIUM 4 MG PO TABS
4.0000 mg | ORAL_TABLET | Freq: Once | ORAL | Status: AC
  Administered 2014-09-28: 4 mg via ORAL
  Filled 2014-09-28: qty 1

## 2014-09-28 NOTE — Progress Notes (Signed)
ANTICOAGULATION CONSULT NOTE - Follow Up Consult  Pharmacy Consult for Coumadin Indication: atrial fibrillation  Allergies  Allergen Reactions  . Lisinopril Rash    ( pt denies this allergy)    Patient Measurements: Height: 5\' 6"  (167.6 cm) (estimated. no speak english) Weight: 132 lb (59.875 kg) IBW/kg (Calculated) : 63.8  Vital Signs: Temp: 98.1 F (36.7 C) (04/10 0602) Temp Source: Oral (04/10 0602) BP: 92/46 mmHg (04/10 0602) Pulse Rate: 72 (04/10 0602)  Labs:  Recent Labs  09/26/14 0444 09/27/14 0445 09/28/14 0500  HGB 10.3* 10.2* 10.4*  HCT 31.5* 30.5* 30.7*  PLT 329 311 297  LABPROT 27.8* 30.5* 30.5*  INR 2.57* 2.89* 2.89*  CREATININE 1.50* 1.38* 1.34    Estimated Creatinine Clearance: 37.9 mL/min (by C-G formula based on Cr of 1.34).  Assessment: 81yom continues on coumadin for afib. INR is therapeutic at 2.89. CBC stable. Continues on amiodarone as PTA.   Home dose: 4mg  MWFSun, 2mg  TTSat  Goal of Therapy:  INR 2-3 Monitor platelets by anticoagulation protocol: Yes   Plan:  -Coumadin 4mg  po today based on home regmien -Daily PT/INR for now  Hildred Laser, Pharm D 09/28/2014 8:28 AM

## 2014-09-28 NOTE — Progress Notes (Signed)
Patient ID: Tyler Christensen, male   DOB: 10-Nov-1932, 81 y.o.   MRN: [id number] Advanced Heart Failure Rounding Note   Subjective:    81 year old [name] with history of end-stage combined systolic and diastolic heart failure with baseline EF 15%, NICM s/p [employer] BiV-ICD 10/26/12, chronic LBBB, PAF on coumadin, HTN, HLD, CVD, severe pulm HTN on Revatio, and chronic milrinone 0.5 mcg via PICC and h/o seizure admitted with dyspnea, CP and abdominal pain.   Recently discharged on IV lasix 3 times a week M-W-F from [facility].   Had a fall 4/7 after he tripped over IV fluid. CT of head ok.    Denies SOB. Denies abdominal pain.   Sodium remains stable. K is low but he has not been taking his KDur pills, hides them in his pockets.     Na 117> 120 > 120 Objective:   Weight Range:  Vital Signs:   Temp:  [97.7 F (36.5 C)-98.2 F (36.8 C)] 98.1 F (36.7 C) (04/10 0602) Pulse Rate:  [67-72] 69 (04/10 0927) Resp:  [18-20] 18 (04/10 0602) BP: (91-111)/(46-57) 94/57 mmHg (04/10 0927) SpO2:  [99 %-100 %] 99 % (04/10 0602) Weight:  [132 lb (59.875 kg)] 132 lb (59.875 kg) (04/10 0602) Last BM Date: 09/23/14  Weight change: Filed Weights   09/26/14 0531 09/27/14 0429 09/28/14 0602  Weight: 135 lb 3.2 oz (61.326 kg) 132 lb 12.8 oz (60.238 kg) 132 lb (59.875 kg)    Intake/Output:   Intake/Output Summary (Last 24 hours) at 09/28/14 1155 Last data filed at 09/28/14 0928  Gross per 24 hour  Intake 1993.6 ml  Output   1600 ml  Net  393.6 ml     Physical Exam: General: Elderly. Sitting on the side of the bed.   NAD. HEENT: normal except small laceration on the back of his head 2 cm. Min bloody exudate.  Neck: supple. JVP 7-8  Carotids 2+ bilaterally; no bruits. No lymphadenopathy or thryomegaly appreciated.  Cor: PMI laterally displaced. Regular rhythm and rate. + s3  Lungs: clear Abdomen: soft, nontender, non distended. No hepatosplenomegaly. No bruits or masses. + bowel sounds. Extremities:  no cyanosis, clubbing, rash.  R and LLE 3+ edmema Neuro: alert & orientedx3, cranial nerves grossly intact. Moves all 4 extremities w/o difficulty. Affect pleasant.  Telemetry:  AV paced   Labs: Basic Metabolic Panel:  Recent Labs Lab 09/24/14 0530  09/25/14 0530 09/25/14 0905 09/26/14 0444 09/27/14 0445 09/28/14 0500  NA 119*  < > 119* 117* 120* 120* 120*  K 2.6*  --   --  2.4* 2.8* 2.6* 2.5*  CL 76*  --   --  76* 80* 78* 81*  CO2 29  --   --  25 27 28 27   GLUCOSE 146*  --   --  238* 139* 117* 132*  BUN 72*  --   --  70* 64* 62* 52*  CREATININE 1.62*  --   --  1.73* 1.50* 1.38* 1.34  CALCIUM 9.2  --   --  8.4 8.9 8.4 8.5  < > = values in this interval not displayed.  Liver Function Tests: No results for input(s): AST, ALT, ALKPHOS, BILITOT, PROT, ALBUMIN in the last 168 hours. No results for input(s): LIPASE, AMYLASE in the last 168 hours. No results for input(s): AMMONIA in the last 168 hours.  CBC:  Recent Labs Lab 09/24/14 0530 09/25/14 0530 09/26/14 0444 09/27/14 0445 09/28/14 0500  WBC 12.7* 14.1* 16.0* 14.2* 14.1*  HGB 10.9* 10.7*  10.3* 10.2* 10.4*  HCT 32.6* 32.5* 31.5* 30.5* 30.7*  MCV 76.0* 75.8* 76.3* 77.0* 76.2*  PLT 341 348 329 311 297    Cardiac Enzymes:  Recent Labs Lab 09/21/14 1916 09/22/14 0107 09/22/14 0602  TROPONINI 0.05* 0.06* 0.04*    BNP: BNP (last 3 results)  Recent Labs  09/15/14 0209 09/16/14 0250 09/21/14 0220  BNP 2970.9* 3495.2* 2515.7*    ProBNP (last 3 results)  Recent Labs  03/01/14 1959 03/08/14 2230 06/05/14 0332  PROBNP 11880.0* 15334.0* 19533.0*      Other results:  Imaging: No results found.   Medications:     Scheduled Medications: . amiodarone  200 mg Oral Daily  . fentaNYL  25 mcg Intravenous Once  . magnesium oxide  400 mg Oral BID  . metoCLOPramide  10 mg Oral BID  . polyethylene glycol  17 g Oral Daily  . potassium chloride  40 mEq Oral TID  . sildenafil  20 mg Oral TID  .  sodium chloride  10-40 mL Intracatheter Q12H  . sodium chloride  3 mL Intravenous Q12H  . sorbitol  30 mL Oral Daily  . spironolactone  25 mg Oral BID  . torsemide  80 mg Oral BID  . warfarin  4 mg Oral ONCE-1800  . Warfarin - Pharmacist Dosing Inpatient   Does not apply q1800    Infusions: . milrinone 0.5 mcg/kg/min (09/28/14 9518)    PRN Medications: sodium chloride, acetaminophen, LORazepam, ondansetron (ZOFRAN) IV, sodium chloride, sodium chloride, traMADol   Assessment   1.  Acute on chronic systolic HF (EF 84%) on home milrinone 2. Acute on CKD, stage III-IV 3. Hyperkalemia 4. PAF in NSR on amio 5. Retroperitoneal mass. 6. hyponatremia 7. PAH 8. Hypocalcemia 9. Acute on chronic respiratory failure 10. DNR 11. Elevated WBC  12. Hyponatremia 13. Hypokalemia 14. Fall 09/25/2014- CT of head negative.     Plan:   Hyponatremic, sodium remaining relatively stable 117>120 > 120.    Volume status ok. Continue current diuretic regimen.  Continue milrinone 0.5 mcg.  Had Dr Dyann Kief have a long talk with him in Spanish about not hiding his Lawson pills.  Will try to crush his pills in applesauce.  He says he will take them.  Need to replete K today.    WBC unchanged 14. Blood Cultures --final NGTD.      Staying out of atrial fibrillation. Continue amiodarone 200 mg daily and coumadin.   Fall on 4/7-- hit head. CT of head/Cervical spine.  No acute abnormality noted. Small laceration noted.   Refuses hospice.  Hopefully to SNF tomorrow.   Length of Stay: 7   Aulani Shipton Aundra Dubin  09/28/2014, 11:55 AM

## 2014-09-29 ENCOUNTER — Encounter (HOSPITAL_COMMUNITY): Payer: Medicare Other

## 2014-09-29 DIAGNOSIS — E876 Hypokalemia: Secondary | ICD-10-CM

## 2014-09-29 LAB — PROTIME-INR
INR: 3.01 — ABNORMAL HIGH (ref 0.00–1.49)
Prothrombin Time: 31.5 seconds — ABNORMAL HIGH (ref 11.6–15.2)

## 2014-09-29 LAB — BASIC METABOLIC PANEL
ANION GAP: 3 — AB (ref 5–15)
BUN: 54 mg/dL — AB (ref 6–23)
CALCIUM: 8.6 mg/dL (ref 8.4–10.5)
CO2: 33 mmol/L — AB (ref 19–32)
CREATININE: 1.35 mg/dL (ref 0.50–1.35)
Chloride: 88 mmol/L — ABNORMAL LOW (ref 96–112)
GFR calc Af Amer: 56 mL/min — ABNORMAL LOW (ref >90)
GFR calc non Af Amer: 48 mL/min — ABNORMAL LOW (ref >90)
Glucose, Bld: 143 mg/dL — ABNORMAL HIGH (ref 70–99)
Potassium: 4.5 mmol/L (ref 3.5–5.1)
Sodium: 124 mmol/L — ABNORMAL LOW (ref 135–145)

## 2014-09-29 LAB — CBC
HEMATOCRIT: 33.4 % — AB (ref 39.0–52.0)
HEMOGLOBIN: 11 g/dL — AB (ref 13.0–17.0)
MCH: 25.7 pg — ABNORMAL LOW (ref 26.0–34.0)
MCHC: 32.9 g/dL (ref 30.0–36.0)
MCV: 78 fL (ref 78.0–100.0)
Platelets: 308 10*3/uL (ref 150–400)
RBC: 4.28 MIL/uL (ref 4.22–5.81)
RDW: 17.5 % — ABNORMAL HIGH (ref 11.5–15.5)
WBC: 18.5 10*3/uL — ABNORMAL HIGH (ref 4.0–10.5)

## 2014-09-29 MED ORDER — LORAZEPAM 1 MG PO TABS: 1.0000 mg | ORAL_TABLET | Freq: Every evening | ORAL | Status: AC | PRN

## 2014-09-29 MED ORDER — POTASSIUM CHLORIDE CRYS ER 20 MEQ PO TBCR: 40.0000 meq | EXTENDED_RELEASE_TABLET | Freq: Three times a day (TID) | ORAL | Status: DC

## 2014-09-29 NOTE — Progress Notes (Signed)
Patient ID: Tyler Christensen, male   DOB: Aug 23, 1932, 81 y.o.   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   Recently discharged on IV lasix 3 times a week M-W-F from [facility].   Had a fall 4/7 after he tripped over IV fluid. CT of head ok.    Denies SOB. Denies abdominal pain.   Sodium remains stable. K is low but he has not been taking his KDur pills, hides them in his pockets.     Na 117> 120 > 120 Objective:   Weight Range:  Vital Signs:   Temp:  [98.3 F (36.8 C)-98.5 F (36.9 C)] 98.5 F (36.9 C) (04/11 0605) Pulse Rate:  [69-81] 81 (04/11 0605) Resp:  [16-18] 18 (04/11 0605) BP: (94-97)/(48-58) 94/57 mmHg (04/11 0605) SpO2:  [98 %-100 %] 100 % (04/11 0605) Weight:  [59.376 kg (130 lb 14.4 oz)] 59.376 kg (130 lb 14.4 oz) (04/11 0605) Last BM Date: 09/23/14  Weight change: Filed Weights   09/27/14 0429 09/28/14 0602 09/29/14 0605  Weight: 60.238 kg (132 lb 12.8 oz) 59.875 kg (132 lb) 59.376 kg (130 lb 14.4 oz)    Intake/Output:   Intake/Output Summary (Last 24 hours) at 09/29/14 5732 Last data filed at 09/29/14 0600  Gross per 24 hour  Intake 1393.6 ml  Output   1000 ml  Net  393.6 ml     Physical Exam: General: Elderly. Sitting on the side of the bed.   NAD. HEENT: normal except small laceration on the back of his head 2 cm. Min bloody exudate.  Neck: supple. JVP 7-8  Carotids 2+ bilaterally; no bruits. No lymphadenopathy or thryomegaly appreciated.  Cor: PMI laterally displaced. Regular rhythm and rate. + s3  Lungs: clear Abdomen: soft, nontender, non distended. No hepatosplenomegaly. No bruits or masses. + bowel  sounds. Extremities: no cyanosis, clubbing, rash.  R and LLE 3+ edmema Neuro: alert & orientedx3, cranial nerves grossly intact. Moves all 4 extremities w/o difficulty. Affect pleasant.  Telemetry:  AV paced   Labs: Basic Metabolic Panel:  Recent Labs Lab 09/24/14 0530  09/25/14 0530 09/25/14 0905 09/26/14 0444 09/27/14 0445 09/28/14 0500  NA 119*  < > 119* 117* 120* 120* 120*  K 2.6*  --   --  2.4* 2.8* 2.6* 2.5*  CL 76*  --   --  76* 80* 78* 81*  CO2 29  --   --  25 27 28 27   GLUCOSE 146*  --   --  238* 139* 117* 132*  BUN 72*  --   --  70* 64* 62* 52*  CREATININE 1.62*  --   --  1.73* 1.50* 1.38* 1.34  CALCIUM 9.2  --   --  8.4 8.9 8.4 8.5  < > = values in this interval not displayed.  Liver Function Tests: No results for input(s): AST, ALT, ALKPHOS, BILITOT, PROT, ALBUMIN in the last 168 hours. No results for input(s): LIPASE, AMYLASE in the last 168 hours. No results for input(s): AMMONIA in the last 168 hours.  CBC:  Recent Labs Lab 09/25/14 0530 09/26/14 0444 09/27/14 0445 09/28/14 0500 09/29/14 0712  WBC 14.1* 16.0* 14.2* 14.1* 18.5*  HGB 10.7* 10.3* 10.2* 10.4* 11.0*  HCT 32.5* 31.5* 30.5* 30.7* 33.4*  MCV 75.8* 76.3* 77.0* 76.2* 78.0  PLT 348 329 311 297 308    Cardiac Enzymes: No results for input(s): CKTOTAL, CKMB, CKMBINDEX, TROPONINI in the last 168 hours.  BNP: BNP (last 3 results)  Recent Labs  09/15/14 0209 09/16/14 0250 09/21/14 0220  BNP 2970.9* 3495.2* 2515.7*    ProBNP (last 3 results)  Recent Labs  03/01/14 1959 03/08/14 2230 06/05/14 0332  PROBNP 11880.0* 15334.0* 19533.0*      Other results:  Imaging: No results found.   Medications:     Scheduled Medications: . amiodarone  200 mg Oral Daily  . fentaNYL  25 mcg Intravenous Once  . magnesium oxide  400 mg Oral BID  . metoCLOPramide  10 mg Oral BID  . polyethylene glycol  17 g Oral Daily  . potassium chloride  40 mEq Oral TID  . sildenafil  20 mg Oral TID   . sodium chloride  10-40 mL Intracatheter Q12H  . sodium chloride  3 mL Intravenous Q12H  . sorbitol  30 mL Oral Daily  . spironolactone  25 mg Oral BID  . torsemide  80 mg Oral BID  . Warfarin - Pharmacist Dosing Inpatient   Does not apply q1800    Infusions: . milrinone 0.5 mcg/kg/min (09/29/14 0450)    PRN Medications: sodium chloride, acetaminophen, LORazepam, ondansetron (ZOFRAN) IV, sodium chloride, sodium chloride, traMADol   Assessment   1.  Acute on chronic systolic HF (EF 00%) on home milrinone 2. Acute on CKD, stage III-IV 3. Hyperkalemia 4. PAF in NSR on amio 5. Retroperitoneal mass. 6. hyponatremia 7. PAH 8. Hypocalcemia 9. Acute on chronic respiratory failure 10. DNR 11. Elevated WBC  12. Hyponatremia 13. Hypokalemia 14. Fall 09/25/2014- CT of head negative.     Plan:   Hyponatremic, sodium remaining relatively stable 117>120 > 120.    Volume status ok. Continue current diuretic regimen.  Continue milrinone 0.5 mcg.  Long discussion yesterday about him hiding his Kcl pills in his pocket. BMET pending for this am.   WBC up 14-> 18k. No fever. Blood Cultures --final NGTD.  UA negative.     Staying out of atrial fibrillation. Continue amiodarone 200 mg daily and coumadin.   Fall on 4/7-- hit head. CT of head/Cervical spine.  No acute abnormality noted. Small laceration noted.   Refuses hospice despite extensive discussions.   Will attempt to discharge to [facility] today if potassium > 3.0.   Length of Stay: 8   Daniel Bensimhon MD 09/29/2014, 7:22 AM

## 2014-09-29 NOTE — Progress Notes (Signed)
Pt has orders to be discharged.  Telemetry has been discontinued.  PICC line remained in place and intact, as he is chronically on a Milrinone gtts, currently at 8.64mL/hr.  [facility] has disconnected his milrinone from [facility] Iv pump and transferred to the portable/home unit.  Report was given to receiving nurse at [facility] [place].  Receiving nurse had no further questions about care for this pt.  Pt in no acute distress.

## 2014-09-29 NOTE — Discharge Summary (Signed)
Advanced Heart Failure Team  Discharge Summary   Patient ID: Tyler Christensen MRN: [id number], DOB/AGE: 1933/05/13 81 y.o. Admit date: 09/21/2014 D/C date:     09/29/2014   Primary Discharge Diagnoses:  1. Acute on chronic systolic HF (EF 17%) on home milrinone 2. Acute on CKD, stage III-IV 3. Hyperkalemia 4. PAF in NSR on amio 5. Retroperitoneal mass. 6. hyponatremia 7. PAH 8. Hypocalcemia 9. Acute on chronic respiratory failure 10. DNR 11. Elevated WBC  12. Hyponatremia 13. Hypokalemia 14. Fall 09/25/2014- CT of head negative.    Hospital Course: Tyler Christensen is a 81 year old  [name] with history of end-stage combined systolic and diastolic heart failure with baseline EF 15%, NICM s/p Medtronic BiV-ICD 10/26/12, chronic LBBB, PAF on coumadin, HTN, HLD, CVD, severe pulm HTN on Revatio, and chronic milrinone 0.5 mcg via PICC and h/o seizure admitted with dyspnea, chest pain, and abdominal pain. He has had multiple readmits for HF symptoms. He has failed outpatient management in the community so social work consulted for SNF placement.   On admit creatinine was elevated above his baseline so diuretics held for a day and then restarted. He will continue on torsemide 80 mg twice a day + 40 meq potasium three times a day. He also had ongoing difficulty with hypokalemia that was later identified as medication noncompliance because he was hiding potassium pills in his pockets. Once this was identified the nurses made sure he took all potassium and on the day of discharge his K was within normal range. He will continue on milrinone at 0.5 mcg per hour via PICC.   Due to end stage HF he has had ongoing issues with hyponatremia. Sodium at one point was  117 and he received tolvaptan for a few days.. On the day of discharge sodium was 124.  WBC was elevated but he was afebrile with negative blood cultures and urine.   On April 7th  he had a fall after he tripped over his IV tubing. He hit his head and  had CT of head that was negative.. No neurologic changes noted. Minimal skin loss noted and this was managed with a couple of steri strips. He remained in a NSR on amiodarone 200 mg daily. He will continue on coumadin and have INR checked daily.   He will continue to be followed in the HF clinic and has an appointment next week. The challenge will be outpatient management and medication compliance. He was evaluated by Dr Haroldine Laws and deemed stable for discharge to [facility]. He will receive weekly BMET at INR with results faxed to HF clinic.    Discharge Weight: 130 pounds.  Discharge Vitals: Blood pressure 94/57, pulse 81, temperature 98.5 F (36.9 C), temperature source Oral, resp. rate 18, height 5\' 6"  (1.676 m), weight 130 lb 14.4 oz (59.376 kg), SpO2 100 %.  Labs: Lab Results  Component Value Date   WBC 18.5* 09/29/2014   HGB 11.0* 09/29/2014   HCT 33.4* 09/29/2014   MCV 78.0 09/29/2014   PLT 308 09/29/2014     Recent Labs Lab 09/29/14 0712  NA 124*  K 4.5  CL 88*  CO2 33*  BUN 54*  CREATININE 1.35  CALCIUM 8.6  GLUCOSE 143*   Lab Results  Component Value Date   CHOL 128 06/12/2012   HDL 27* 06/12/2012   LDLCALC 78 06/12/2012   TRIG 113 06/12/2012   BNP (last 3 results)  Recent Labs  09/15/14 0209 09/16/14 0250 09/21/14 0220  BNP 2970.9* 3495.2* 2515.7*    ProBNP (last 3 results)  Recent Labs  03/01/14 1959 03/08/14 2230 06/05/14 0332  PROBNP 11880.0* 15334.0* 19533.0*     Diagnostic Studies/Procedures   No results found.  Discharge Medications     Medication List    STOP taking these medications        furosemide 10 MG/ML injection  Commonly known as:  LASIX     traMADol 50 MG tablet  Commonly known as:  ULTRAM      TAKE these medications        acetaminophen 325 MG tablet  Commonly known as:  TYLENOL  Take 2 tablets (650 mg total) by mouth every 6 (six) hours as needed for mild pain (or Fever >/= 101).     amiodarone 200  MG tablet  Commonly known as:  PACERONE  Take 1 tablet (200 mg total) by mouth daily.     LORazepam 1 MG tablet  Commonly known as:  ATIVAN  Take 1 tablet (1 mg total) by mouth at bedtime as needed for anxiety.     magnesium oxide 400 MG tablet  Commonly known as:  MAG-OX  Take 1 tablet (400 mg total) by mouth 2 (two) times daily.     metoCLOPramide 10 MG tablet  Commonly known as:  REGLAN  Take 1 tablet (10 mg total) by mouth 2 (two) times daily.     milrinone 20 MG/100ML Soln infusion  Commonly known as:  PRIMACOR  Inject 33.95 mcg/min into the vein continuous.     polyethylene glycol packet  Commonly known as:  MIRALAX / GLYCOLAX  Take 17 g by mouth daily.     potassium chloride SA 20 MEQ tablet  Commonly known as:  K-DUR,KLOR-CON  Take 2 tablets (40 mEq total) by mouth 3 (three) times daily.     sildenafil 20 MG tablet  Commonly known as:  REVATIO  Take 1 tablet (20 mg total) by mouth 3 (three) times daily.     sorbitol 70 % Soln  Take 30 mLs by mouth daily.     spironolactone 25 MG tablet  Commonly known as:  ALDACTONE  Take 1 tablet (25 mg total) by mouth 2 (two) times daily.     torsemide 20 MG tablet  Commonly known as:  DEMADEX  Take 4 tablets (80 mg total) by mouth 2 (two) times daily. Replace Monday Wednesday and Friday morning demadex with 80mg  IV lasix. [facility] to manage     warfarin 4 MG tablet  Commonly known as:  COUMADIN  Take 1 tablet (4 mg total) by mouth daily at 6 PM. Take 4 mg Monday, Wednesday, Friday and Sunday. 2 mg on Tues, Thurs and Sat        Disposition   The patient will be discharged in stable condition to home. Discharge Instructions    Contraindication to ACEI at discharge    Complete by:  As directed      Diet - low sodium heart healthy    Complete by:  As directed   Low sodium and less than 2 liters of fluid per day     Heart Failure patients record your daily weight using the same scale at the same time of day    Complete by:   As directed      Increase activity slowly    Complete by:  As directed           Follow-up Information    Follow up with  Glori Bickers, MD On 10/07/2014.   Specialty:  Cardiology   Why:  at 0920 am in the Advanced Heart Failure Clinic--gate code [id number]--bring all medications   Contact information:   [place] [address] [phone]         Duration of Discharge Encounter: Greater than 35 minutes   Signed, CLEGG,[name] NP-C  09/29/2014, 9:04 AM

## 2014-09-29 NOTE — Progress Notes (Signed)
Interpreter Lesle Chris for people from [facility] and his family

## 2014-09-30 ENCOUNTER — Encounter: Payer: Self-pay | Admitting: Internal Medicine

## 2014-09-30 ENCOUNTER — Non-Acute Institutional Stay (SKILLED_NURSING_FACILITY): Payer: Medicare Other | Admitting: Internal Medicine

## 2014-09-30 DIAGNOSIS — I480 Paroxysmal atrial fibrillation: Secondary | ICD-10-CM | POA: Diagnosis not present

## 2014-09-30 DIAGNOSIS — I10 Essential (primary) hypertension: Secondary | ICD-10-CM

## 2014-09-30 DIAGNOSIS — E878 Other disorders of electrolyte and fluid balance, not elsewhere classified: Secondary | ICD-10-CM | POA: Diagnosis not present

## 2014-09-30 DIAGNOSIS — N183 Chronic kidney disease, stage 3 unspecified: Secondary | ICD-10-CM

## 2014-09-30 DIAGNOSIS — I5022 Chronic systolic (congestive) heart failure: Secondary | ICD-10-CM

## 2014-09-30 DIAGNOSIS — Z7901 Long term (current) use of anticoagulants: Secondary | ICD-10-CM | POA: Diagnosis not present

## 2014-09-30 DIAGNOSIS — Z95810 Presence of automatic (implantable) cardiac defibrillator: Secondary | ICD-10-CM | POA: Diagnosis not present

## 2014-09-30 NOTE — Progress Notes (Signed)
Patient ID: Tyler Christensen, male   DOB: 18-Nov-1932, 81 y.o.   MRN: [id number]    HISTORY AND PHYSICAL  09/30/14  Location:  [facility]    Place of Service: SNF [phone])   Extended Emergency Contact Information Primary Emergency Contact: Cidra Rn,[name]  [place] Phone: [phone] Relation: None Secondary Emergency Contact: Rivera,Premitila Address: [address]          [place] [address] [place] Phone: [phone] Relation: Spouse  Advanced Directive information  DNR  Chief Complaint  Patient presents with  . 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 hospital records reviewed. EF 20% at d/c. Cr elevated at admission but trended down to baseline at d/c. Na dropped to 117 during admission but rebounded to 124 at d/c. He fell while in hospital after he tripped over IV tubing and hit his head. CT head neg and skin tear repaired with steri strips. No sutures req'd.  He takes coumadin due to afib. He will need weekly BMET and INR according to d/c summary. He will f/u with HF clinic. 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 D/c weight 130 lbs  He has no c/o today. Translator utilized as he speaks [place] and very little [place]. Appetite is okay and he is sleeping well. No nursing issues  BP stable on current meds  CHF stable on diuretics, potassium supplement, milrinone gtt. He has an ICD (biventricular)  Afib rate controlled on amiodarone  He takes lipitor for cholesterol Past Medical History  Diagnosis Date  . HTN (hypertension)   . High cholesterol   . CHF (congestive heart failure)   . BBBB (bilateral bundle branch block)   . LBBB (left bundle branch block)   . Seizures 06/11/2012    new onset/notes (06/11/2012)  . SOB  (shortness of breath)     "sometimes when I lay down;; related to not taking my RX" (06/11/2012)  . Myocardial infarction     06/10/2012  . Atrial fibrillation   . Stroke   . Atrial thrombus     left  . Pulmonary HTN   . Atrial fibrillation 06/12/2012  . Coronary artery disease     Past Surgical History  Procedure Laterality Date  . Throat surgery  1942    "and nose" (06/11/2012); ?T&A  . Inguinal hernia repair  ~ 2008    "both sides" (06/11/2012)  . Tee without cardioversion  06/29/2012    Procedure: TRANSESOPHAGEAL ECHOCARDIOGRAM (TEE);  Surgeon: Jolaine Artist, MD;  Location: [facility] ENDOSCOPY;   . Cardioversion N/A 08/14/2012    Procedure: CARDIOVERSION;  Surgeon: Jolaine Artist, MD;  Location: [facility] ENDOSCOPY;  . Tee without cardioversion N/A 11/12/2013    Procedure: TRANSESOPHAGEAL ECHOCARDIOGRAM (TEE);  Surgeon: Candee Furbish, MD;  Location: [facility] ENDOSCOPY;   . Tee without cardioversion N/A 11/19/2013    Procedure: TRANSESOPHAGEAL ECHOCARDIOGRAM (TEE);  Surgeon: Jolaine Artist, MD;  Location: [facility] ENDOSCOPY;   . Cardioversion N/A 12/27/2013    Procedure: CARDIOVERSION;  Surgeon: Jolaine Artist, MD;  Location: [facility] ENDOSCOPY;   . Left and right heart catheterization with coronary angiogram N/A 07/02/2012    Procedure: LEFT AND RIGHT HEART CATHETERIZATION WITH CORONARY ANGIOGRAM;  Surgeon: Jolaine Artist, MD;  Location: [facility] CATH LAB; Mild  non-obstructive CAD, severe PAH, PA = 93/42 (59), severe NICM w/ EF 15%  . Right heart catheterization N/A 07/06/2012    Procedure: RIGHT HEART CATH;  Surgeon: Jolaine Artist, MD; to assess response to milrinone; Moderate PH with profound reduction in PVR with milrinone/PDE-5 inhibitor   . Bi-ventricular implantable cardioverter defibrillator N/A 10/26/2012    Procedure: BI-VENTRICULAR IMPLANTABLE CARDIOVERTER DEFIBRILLATOR  (CRT-D);  Surgeon: Evans Lance, MD;  Medtronic Evera XT CRTD, [facility] ICD serial #[id number]      Patient Care  Team: Harvie Junior, MD as PCP - General (Specialist)  History   Social History  . Marital Status: Divorced    Spouse Name: N/A  . Number of Children: N/A  . Years of Education: N/A   Occupational History  . Retired    Social History Main Topics  . Smoking status: Never Smoker   . Smokeless tobacco: Never Used  . Alcohol Use: No  . Drug Use: No  . Sexual Activity: No   Other Topics Concern  . Not on file   Social History Narrative   [name], Spanish only (few words of English). Lives with wife.     reports that he has never smoked. He has never used smokeless tobacco. He reports that he does not drink alcohol or use illicit drugs.  Family History  Problem Relation Age of Onset  . Heart attack      father had MI in his 31s, no fhx of early heart problem before age 104  . Heart attack      mother died of MI around 47   Family Status  Relation Status Death Age  . Mother Deceased   . Father Deceased     Immunization History  Administered Date(s) Administered  . Influenza Split 06/12/2012  . Influenza,inj,Quad PF,36+ Mos 04/04/2014, 06/07/2014  . Pneumococcal Polysaccharide-23 06/12/2012    Allergies  Allergen Reactions  . Lisinopril Rash    ( pt denies this allergy)    Medications: Patient's Medications  New Prescriptions   No medications on file  Previous Medications   ACETAMINOPHEN (TYLENOL) 325 MG TABLET    Take 2 tablets (650 mg total) by mouth every 6 (six) hours as needed for mild pain (or Fever >/= 101).   AMIODARONE (PACERONE) 200 MG TABLET    Take 1 tablet (200 mg total) by mouth daily.   LORAZEPAM (ATIVAN) 1 MG TABLET    Take 1 tablet (1 mg total) by mouth at bedtime as needed for anxiety.   MAGNESIUM OXIDE (MAG-OX) 400 MG TABLET    Take 1 tablet (400 mg total) by mouth 2 (two) times daily.   METOCLOPRAMIDE (REGLAN) 10 MG TABLET    Take 1 tablet (10 mg total) by mouth 2 (two) times daily.   MILRINONE (PRIMACOR) 20 MG/100ML SOLN INFUSION     Inject 33.95 mcg/min into the vein continuous.   POLYETHYLENE GLYCOL (MIRALAX / GLYCOLAX) PACKET    Take 17 g by mouth daily.   POTASSIUM CHLORIDE SA (K-DUR,KLOR-CON) 20 MEQ TABLET    Take 2 tablets (40 mEq total) by mouth 3 (three) times daily.   SILDENAFIL (REVATIO) 20 MG TABLET    Take 1 tablet (20 mg total) by mouth 3 (three) times daily.   SORBITOL 70 % SOLN    Take 30 mLs by mouth daily.   SPIRONOLACTONE (ALDACTONE) 25 MG TABLET    Take 1 tablet (25 mg total) by mouth 2 (two) times daily.   TORSEMIDE (DEMADEX) 20  MG TABLET    Take 4 tablets (80 mg total) by mouth 2 (two) times daily. Replace Monday Wednesday and Friday morning demadex with $RemoveBefo'80mg'XQHtlTfoyVr$  IV lasix. [facility] to manage   WARFARIN (COUMADIN) 4 MG TABLET    Take 1 tablet (4 mg total) by mouth daily at 6 PM. Take 4 mg Monday, Wednesday, Friday and Sunday. 2 mg on Tues, Thurs and Sat  Modified Medications   No medications on file  Discontinued Medications   No medications on file    Review of Systems  Constitutional: Negative for chills, activity change and fatigue.  HENT: Negative for sore throat and trouble swallowing.   Eyes: Negative for visual disturbance.  Respiratory: Positive for shortness of breath. Negative for cough and chest tightness.   Cardiovascular: Negative for chest pain, palpitations and leg swelling.  Gastrointestinal: Negative for nausea, vomiting, abdominal pain and blood in stool.  Genitourinary: Negative for urgency, frequency and difficulty urinating.  Musculoskeletal: Negative for arthralgias and gait problem.  Skin: Negative for rash.  Neurological: Negative for weakness and headaches.  Psychiatric/Behavioral: Negative for confusion and sleep disturbance. The patient is not nervous/anxious.     Filed Vitals:   09/30/14 1605  BP: 110/54  Pulse: 78  Temp: 97.3 F (36.3 C)   There is no weight on file to calculate BMI.  Physical Exam  Constitutional: He is oriented to person, place, and time. He  appears well-developed and well-nourished. No distress.  HENT:  Mouth/Throat: Oropharynx is clear and moist.  Eyes: Pupils are equal, round, and reactive to light. No scleral icterus.  Neck: Neck supple. No JVD present. Carotid bruit is not present. No thyromegaly present.  Cardiovascular: Normal rate, regular rhythm and intact distal pulses.  Exam reveals gallop. Exam reveals no friction rub.   Murmur (1/6 SEM) heard. LV heave. no distal LE swelling  Pulmonary/Chest: Effort normal and breath sounds normal. He has no wheezes. He has no rales. He exhibits no tenderness.    Abdominal: Soft. Bowel sounds are normal. He exhibits no distension, no abdominal bruit, no pulsatile midline mass and no mass. There is no tenderness. There is no rebound and no guarding.  Lymphadenopathy:    He has no cervical adenopathy.  Neurological: He is alert and oriented to person, place, and time. He has normal reflexes.  Skin: Skin is warm and dry. No rash noted.  Psychiatric: He has a normal mood and affect. His behavior is normal. Judgment and thought content normal.     Labs reviewed: Admission on 09/21/2014, Discharged on 09/29/2014  No results displayed because visit has over 200 results.    Admission on 09/16/2014, Discharged on 09/18/2014  Component Date Value Ref Range Status  . Sodium 09/16/2014 125* 135 - 145 mmol/L Final  . Potassium 09/16/2014 5.2* 3.5 - 5.1 mmol/L Final  . Chloride 09/16/2014 89* 96 - 112 mmol/L Final  . CO2 09/16/2014 21  19 - 32 mmol/L Final  . Glucose, Bld 09/16/2014 183* 70 - 99 mg/dL Final  . BUN 09/16/2014 79* 6 - 23 mg/dL Final  . Creatinine, Ser 09/16/2014 2.20* 0.50 - 1.35 mg/dL Final  . Calcium 09/16/2014 9.2  8.4 - 10.5 mg/dL Final  . Total Protein 09/16/2014 7.2  6.0 - 8.3 g/dL Final  . Albumin 09/16/2014 3.4* 3.5 - 5.2 g/dL Final  . AST 09/16/2014 35  0 - 37 U/L Final  . ALT 09/16/2014 42  0 - 53 U/L Final  . Alkaline Phosphatase 09/16/2014 223* 39 - 117  U/L Final  . Total Bilirubin 09/16/2014 0.9  0.3 - 1.2 mg/dL Final  . GFR calc non Af Amer 09/16/2014 27* >90 mL/min Final  . GFR calc Af Amer 09/16/2014 31* >90 mL/min Final   Comment: (NOTE) The eGFR has been calculated using the CKD EPI equation. This calculation has not been validated in all clinical situations. eGFR's persistently <90 mL/min signify possible Chronic Kidney Disease.   . Anion gap 09/16/2014 15  5 - 15 Final  . WBC 09/16/2014 14.4* 4.0 - 10.5 K/uL Final  . RBC 09/16/2014 4.11* 4.22 - 5.81 MIL/uL Final  . Hemoglobin 09/16/2014 10.6* 13.0 - 17.0 g/dL Final  . HCT 09/16/2014 32.8* 39.0 - 52.0 % Final  . MCV 09/16/2014 79.8  78.0 - 100.0 fL Final  . MCH 09/16/2014 25.8* 26.0 - 34.0 pg Final  . MCHC 09/16/2014 32.3  30.0 - 36.0 g/dL Final  . RDW 09/16/2014 18.6* 11.5 - 15.5 % Final  . Platelets 09/16/2014 375  150 - 400 K/uL Final  . Neutrophils Relative % 09/16/2014 85* 43 - 77 % Final  . Neutro Abs 09/16/2014 12.3* 1.7 - 7.7 K/uL Final  . Lymphocytes Relative 09/16/2014 6* 12 - 46 % Final  . Lymphs Abs 09/16/2014 0.8  0.7 - 4.0 K/uL Final  . Monocytes Relative 09/16/2014 8  3 - 12 % Final  . Monocytes Absolute 09/16/2014 1.2* 0.1 - 1.0 K/uL Final  . Eosinophils Relative 09/16/2014 1  0 - 5 % Final  . Eosinophils Absolute 09/16/2014 0.1  0.0 - 0.7 K/uL Final  . Basophils Relative 09/16/2014 0  0 - 1 % Final  . Basophils Absolute 09/16/2014 0.0  0.0 - 0.1 K/uL Final  . Troponin i, poc 09/16/2014 0.02  0.00 - 0.08 ng/mL Final  . Comment 3 09/16/2014          Final   Comment: Due to the release kinetics of cTnI, a negative result within the first hours of the onset of symptoms does not rule out myocardial infarction with certainty. If myocardial infarction is still suspected, repeat the test at appropriate intervals.   . B Natriuretic Peptide 09/16/2014 3495.2* 0.0 - 100.0 pg/mL Final  . Prothrombin Time 09/16/2014 28.0* 11.6 - 15.2 seconds Final  . INR  09/16/2014 2.59* 0.00 - 1.49 Final  . Sodium 09/17/2014 127* 135 - 145 mmol/L Final  . Potassium 09/17/2014 3.7  3.5 - 5.1 mmol/L Final   DELTA CHECK NOTED  . Chloride 09/17/2014 88* 96 - 112 mmol/L Final  . CO2 09/17/2014 28  19 - 32 mmol/L Final  . Glucose, Bld 09/17/2014 122* 70 - 99 mg/dL Final  . BUN 09/17/2014 73* 6 - 23 mg/dL Final  . Creatinine, Ser 09/17/2014 1.80* 0.50 - 1.35 mg/dL Final  . Calcium 09/17/2014 8.9  8.4 - 10.5 mg/dL Final  . GFR calc non Af Amer 09/17/2014 34* >90 mL/min Final  . GFR calc Af Amer 09/17/2014 40* >90 mL/min Final   Comment: (NOTE) The eGFR has been calculated using the CKD EPI equation. This calculation has not been validated in all clinical situations. eGFR's persistently <90 mL/min signify possible Chronic Kidney Disease.   . Anion gap 09/17/2014 11  5 - 15 Final  . Prothrombin Time 09/17/2014 27.8* 11.6 - 15.2 seconds Final  . INR 09/17/2014 2.57* 0.00 - 1.49 Final  . Sodium 09/18/2014 124* 135 - 145 mmol/L Final  . Potassium 09/18/2014 3.0* 3.5 - 5.1 mmol/L Final   DELTA CHECK NOTED  .  Chloride 09/18/2014 82* 96 - 112 mmol/L Final  . CO2 09/18/2014 30  19 - 32 mmol/L Final  . Glucose, Bld 09/18/2014 160* 70 - 99 mg/dL Final  . BUN 09/18/2014 66* 6 - 23 mg/dL Final  . Creatinine, Ser 09/18/2014 1.66* 0.50 - 1.35 mg/dL Final  . Calcium 09/18/2014 8.8  8.4 - 10.5 mg/dL Final  . GFR calc non Af Amer 09/18/2014 38* >90 mL/min Final  . GFR calc Af Amer 09/18/2014 44* >90 mL/min Final   Comment: (NOTE) The eGFR has been calculated using the CKD EPI equation. This calculation has not been validated in all clinical situations. eGFR's persistently <90 mL/min signify possible Chronic Kidney Disease.   . Anion gap 09/18/2014 12  5 - 15 Final  . Prothrombin Time 09/18/2014 30.2* 11.6 - 15.2 seconds Final  . INR 09/18/2014 2.85* 0.00 - 1.49 Final  Admission on 09/15/2014, Discharged on 09/15/2014  Component Date Value Ref Range Status  . WBC  09/15/2014 15.9* 4.0 - 10.5 K/uL Final  . RBC 09/15/2014 4.26  4.22 - 5.81 MIL/uL Final  . Hemoglobin 09/15/2014 10.8* 13.0 - 17.0 g/dL Final  . HCT 09/15/2014 33.8* 39.0 - 52.0 % Final  . MCV 09/15/2014 79.3  78.0 - 100.0 fL Final  . MCH 09/15/2014 25.4* 26.0 - 34.0 pg Final  . MCHC 09/15/2014 32.0  30.0 - 36.0 g/dL Final  . RDW 09/15/2014 18.4* 11.5 - 15.5 % Final  . Platelets 09/15/2014 384  150 - 400 K/uL Final  . Sodium 09/15/2014 126* 135 - 145 mmol/L Final  . Potassium 09/15/2014 4.4  3.5 - 5.1 mmol/L Final  . Chloride 09/15/2014 89* 96 - 112 mmol/L Final  . CO2 09/15/2014 24  19 - 32 mmol/L Final  . Glucose, Bld 09/15/2014 206* 70 - 99 mg/dL Final  . BUN 09/15/2014 66* 6 - 23 mg/dL Final  . Creatinine, Ser 09/15/2014 1.93* 0.50 - 1.35 mg/dL Final  . Calcium 09/15/2014 9.4  8.4 - 10.5 mg/dL Final  . GFR calc non Af Amer 09/15/2014 31* >90 mL/min Final  . GFR calc Af Amer 09/15/2014 36* >90 mL/min Final   Comment: (NOTE) The eGFR has been calculated using the CKD EPI equation. This calculation has not been validated in all clinical situations. eGFR's persistently <90 mL/min signify possible Chronic Kidney Disease.   . Anion gap 09/15/2014 13  5 - 15 Final  . B Natriuretic Peptide 09/15/2014 2970.9* 0.0 - 100.0 pg/mL Final  . Troponin i, poc 09/15/2014 0.03  0.00 - 0.08 ng/mL Final  . Comment 3 09/15/2014          Final   Comment: Due to the release kinetics of cTnI, a negative result within the first hours of the onset of symptoms does not rule out myocardial infarction with certainty. If myocardial infarction is still suspected, repeat the test at appropriate intervals.   . Prothrombin Time 09/15/2014 27.1* 11.6 - 15.2 seconds Final  . INR 09/15/2014 2.49* 0.00 - 1.49 Final  Admission on 08/31/2014, Discharged on 09/12/2014  No results displayed because visit has over 200 results.    Admission on 08/29/2014, Discharged on 08/31/2014  Component Date Value Ref Range  Status  . Sodium 08/29/2014 126* 135 - 145 mmol/L Final  . Potassium 08/29/2014 3.5  3.5 - 5.1 mmol/L Final  . Chloride 08/29/2014 88* 96 - 112 mmol/L Final  . CO2 08/29/2014 26  19 - 32 mmol/L Final  . Glucose, Bld 08/29/2014 116* 70 - 99  mg/dL Final  . BUN 08/29/2014 48* 6 - 23 mg/dL Final  . Creatinine, Ser 08/29/2014 1.81* 0.50 - 1.35 mg/dL Final  . Calcium 08/29/2014 8.5  8.4 - 10.5 mg/dL Final  . GFR calc non Af Amer 08/29/2014 34* >90 mL/min Final  . GFR calc Af Amer 08/29/2014 39* >90 mL/min Final   Comment: (NOTE) The eGFR has been calculated using the CKD EPI equation. This calculation has not been validated in all clinical situations. eGFR's persistently <90 mL/min signify possible Chronic Kidney Disease.   . Anion gap 08/29/2014 12  5 - 15 Final  . B Natriuretic Peptide 08/29/2014 1894.1* 0.0 - 100.0 pg/mL Final  . Troponin i, poc 08/29/2014 0.02  0.00 - 0.08 ng/mL Final  . Comment 3 08/29/2014          Final   Comment: Due to the release kinetics of cTnI, a negative result within the first hours of the onset of symptoms does not rule out myocardial infarction with certainty. If myocardial infarction is still suspected, repeat the test at appropriate intervals.   . Prothrombin Time 08/29/2014 29.6* 11.6 - 15.2 seconds Final  . INR 08/29/2014 2.79* 0.00 - 1.49 Final  . WBC 08/29/2014 13.8* 4.0 - 10.5 K/uL Final  . RBC 08/29/2014 3.93* 4.22 - 5.81 MIL/uL Final  . Hemoglobin 08/29/2014 10.2* 13.0 - 17.0 g/dL Final  . HCT 08/29/2014 31.1* 39.0 - 52.0 % Final  . MCV 08/29/2014 79.1  78.0 - 100.0 fL Final  . MCH 08/29/2014 26.0  26.0 - 34.0 pg Final  . MCHC 08/29/2014 32.8  30.0 - 36.0 g/dL Final  . RDW 08/29/2014 18.5* 11.5 - 15.5 % Final  . Platelets 08/29/2014 369  150 - 400 K/uL Final  . Neutrophils Relative % 08/29/2014 82* 43 - 77 % Final  . Neutro Abs 08/29/2014 11.2* 1.7 - 7.7 K/uL Final  . Lymphocytes Relative 08/29/2014 6* 12 - 46 % Final  . Lymphs Abs  08/29/2014 0.9  0.7 - 4.0 K/uL Final  . Monocytes Relative 08/29/2014 10  3 - 12 % Final  . Monocytes Absolute 08/29/2014 1.4* 0.1 - 1.0 K/uL Final  . Eosinophils Relative 08/29/2014 2  0 - 5 % Final  . Eosinophils Absolute 08/29/2014 0.3  0.0 - 0.7 K/uL Final  . Basophils Relative 08/29/2014 0  0 - 1 % Final  . Basophils Absolute 08/29/2014 0.0  0.0 - 0.1 K/uL Final  . Troponin I 08/29/2014 0.03  <0.031 ng/mL Final   Comment:        NO INDICATION OF MYOCARDIAL INJURY.   . Troponin I 08/29/2014 0.03  <0.031 ng/mL Final   Comment:        NO INDICATION OF MYOCARDIAL INJURY.   . Troponin I 08/30/2014 0.05* <0.031 ng/mL Final   Comment:        PERSISTENTLY INCREASED TROPONIN VALUES IN THE RANGE OF 0.04-0.49 ng/mL CAN BE SEEN IN:       -UNSTABLE ANGINA       -CONGESTIVE HEART FAILURE       -MYOCARDITIS       -CHEST TRAUMA       -ARRYHTHMIAS       -LATE PRESENTING MYOCARDIAL INFARCTION       -COPD   CLINICAL FOLLOW-UP RECOMMENDED.   Marland Kitchen Prothrombin Time 08/30/2014 33.9* 11.6 - 15.2 seconds Final  . INR 08/30/2014 3.32* 0.00 - 1.49 Final  . Prothrombin Time 08/31/2014 41.6* 11.6 - 15.2 seconds Final  . INR 08/31/2014 4.31*  0.00 - 1.49 Final  Anti-coag visit on 08/25/2014  Component Date Value Ref Range Status  . INR 08/25/2014 2.8   Final   [facility]-[name]  Admission on 08/19/2014, Discharged on 08/23/2014  Component Date Value Ref Range Status  . WBC 08/19/2014 14.9* 4.0 - 10.5 K/uL Final  . RBC 08/19/2014 4.07* 4.22 - 5.81 MIL/uL Final  . Hemoglobin 08/19/2014 10.8* 13.0 - 17.0 g/dL Final  . HCT 08/19/2014 32.1* 39.0 - 52.0 % Final  . MCV 08/19/2014 78.9  78.0 - 100.0 fL Final  . MCH 08/19/2014 26.5  26.0 - 34.0 pg Final  . MCHC 08/19/2014 33.6  30.0 - 36.0 g/dL Final  . RDW 08/19/2014 18.4* 11.5 - 15.5 % Final  . Platelets 08/19/2014 396  150 - 400 K/uL Final  . Neutrophils Relative % 08/19/2014 86* 43 - 77 % Final  . Neutro Abs 08/19/2014 12.7* 1.7 - 7.7 K/uL Final  .  Lymphocytes Relative 08/19/2014 5* 12 - 46 % Final  . Lymphs Abs 08/19/2014 0.8  0.7 - 4.0 K/uL Final  . Monocytes Relative 08/19/2014 8  3 - 12 % Final  . Monocytes Absolute 08/19/2014 1.2* 0.1 - 1.0 K/uL Final  . Eosinophils Relative 08/19/2014 1  0 - 5 % Final  . Eosinophils Absolute 08/19/2014 0.2  0.0 - 0.7 K/uL Final  . Basophils Relative 08/19/2014 0  0 - 1 % Final  . Basophils Absolute 08/19/2014 0.0  0.0 - 0.1 K/uL Final  . Sodium 08/19/2014 126* 135 - 145 mmol/L Final  . Potassium 08/19/2014 3.2* 3.5 - 5.1 mmol/L Final  . Chloride 08/19/2014 84* 96 - 112 mmol/L Final  . BUN 08/19/2014 45* 6 - 23 mg/dL Final  . Creatinine, Ser 08/19/2014 1.70* 0.50 - 1.35 mg/dL Final  . Glucose, Bld 08/19/2014 111* 70 - 99 mg/dL Final  . Calcium, Ion 08/19/2014 1.04* 1.13 - 1.30 mmol/L Final  . TCO2 08/19/2014 25  0 - 100 mmol/L Final  . Hemoglobin 08/19/2014 13.3  13.0 - 17.0 g/dL Final  . HCT 08/19/2014 39.0  39.0 - 52.0 % Final  . Troponin i, poc 08/19/2014 0.02  0.00 - 0.08 ng/mL Final  . Comment 3 08/19/2014          Final   Comment: Due to the release kinetics of cTnI, a negative result within the first hours of the onset of symptoms does not rule out myocardial infarction with certainty. If myocardial infarction is still suspected, repeat the test at appropriate intervals.   . B Natriuretic Peptide 08/19/2014 1755.9* 0.0 - 100.0 pg/mL Final  . Sodium 08/19/2014 126* 135 - 145 mmol/L Final  . Potassium 08/19/2014 3.3* 3.5 - 5.1 mmol/L Final  . Chloride 08/19/2014 84* 96 - 112 mmol/L Final  . CO2 08/19/2014 26  19 - 32 mmol/L Final  . Glucose, Bld 08/19/2014 101* 70 - 99 mg/dL Final  . BUN 08/19/2014 48* 6 - 23 mg/dL Final  . Creatinine, Ser 08/19/2014 1.72* 0.50 - 1.35 mg/dL Final  . Calcium 08/19/2014 8.5  8.4 - 10.5 mg/dL Final  . GFR calc non Af Amer 08/19/2014 36* >90 mL/min Final  . GFR calc Af Amer 08/19/2014 42* >90 mL/min Final   Comment: (NOTE) The eGFR has been  calculated using the CKD EPI equation. This calculation has not been validated in all clinical situations. eGFR's persistently <90 mL/min signify possible Chronic Kidney Disease.   . Anion gap 08/19/2014 16* 5 - 15 Final  . Specimen Description 08/20/2014 BLOOD  LEFT HAND   Final  . Special Requests 08/20/2014 BOTTLES DRAWN AEROBIC ONLY 10CC   Final  . Culture 08/20/2014    Final                   Value:NO GROWTH 5 DAYS Performed at [facility]   . Report Status 08/20/2014 08/26/2014 FINAL   Final  . Specimen Description 08/20/2014 BLOOD LEFT ARM   Final  . Special Requests 08/20/2014 BOTTLES DRAWN AEROBIC AND ANAEROBIC 10CC EACH   Final  . Culture 08/20/2014    Final                   Value:NO GROWTH 5 DAYS Performed at [facility]   . Report Status 08/20/2014 08/26/2014 FINAL   Final  . Sodium 08/20/2014 126* 135 - 145 mmol/L Final  . Potassium 08/20/2014 2.8* 3.5 - 5.1 mmol/L Final  . Chloride 08/20/2014 86* 96 - 112 mmol/L Final  . CO2 08/20/2014 31  19 - 32 mmol/L Final  . Glucose, Bld 08/20/2014 151* 70 - 99 mg/dL Final  . BUN 08/20/2014 47* 6 - 23 mg/dL Final  . Creatinine, Ser 08/20/2014 1.59* 0.50 - 1.35 mg/dL Final  . Calcium 08/20/2014 8.5  8.4 - 10.5 mg/dL Final  . GFR calc non Af Amer 08/20/2014 40* >90 mL/min Final  . GFR calc Af Amer 08/20/2014 46* >90 mL/min Final   Comment: (NOTE) The eGFR has been calculated using the CKD EPI equation. This calculation has not been validated in all clinical situations. eGFR's persistently <90 mL/min signify possible Chronic Kidney Disease.   . Anion gap 08/20/2014 9  5 - 15 Final  . Prothrombin Time 08/20/2014 21.4* 11.6 - 15.2 seconds Final  . INR 08/20/2014 1.84* 0.00 - 1.49 Final  . Sodium 08/20/2014 123* 135 - 145 mmol/L Final  . Potassium 08/20/2014 3.2* 3.5 - 5.1 mmol/L Final  . Chloride 08/20/2014 84* 96 - 112 mmol/L Final  . CO2 08/20/2014 32  19 - 32 mmol/L Final  . Glucose, Bld 08/20/2014 172* 70  - 99 mg/dL Final  . BUN 08/20/2014 46* 6 - 23 mg/dL Final  . Creatinine, Ser 08/20/2014 1.58* 0.50 - 1.35 mg/dL Final  . Calcium 08/20/2014 8.3* 8.4 - 10.5 mg/dL Final  . GFR calc non Af Amer 08/20/2014 40* >90 mL/min Final  . GFR calc Af Amer 08/20/2014 46* >90 mL/min Final   Comment: (NOTE) The eGFR has been calculated using the CKD EPI equation. This calculation has not been validated in all clinical situations. eGFR's persistently <90 mL/min signify possible Chronic Kidney Disease.   . Anion gap 08/20/2014 7  5 - 15 Final  . Sodium 08/21/2014 128* 135 - 145 mmol/L Final  . Potassium 08/21/2014 3.5  3.5 - 5.1 mmol/L Final  . Chloride 08/21/2014 87* 96 - 112 mmol/L Final  . CO2 08/21/2014 29  19 - 32 mmol/L Final  . Glucose, Bld 08/21/2014 123* 70 - 99 mg/dL Final  . BUN 08/21/2014 40* 6 - 23 mg/dL Final  . Creatinine, Ser 08/21/2014 1.38* 0.50 - 1.35 mg/dL Final  . Calcium 08/21/2014 8.6  8.4 - 10.5 mg/dL Final  . GFR calc non Af Amer 08/21/2014 47* >90 mL/min Final  . GFR calc Af Amer 08/21/2014 55* >90 mL/min Final   Comment: (NOTE) The eGFR has been calculated using the CKD EPI equation. This calculation has not been validated in all clinical situations. eGFR's persistently <90 mL/min signify possible Chronic Kidney  Disease.   . Anion gap 08/21/2014 12  5 - 15 Final  . Prothrombin Time 08/21/2014 22.0* 11.6 - 15.2 seconds Final  . INR 08/21/2014 1.90* 0.00 - 1.49 Final  . Sodium 08/22/2014 126* 135 - 145 mmol/L Final  . Potassium 08/22/2014 3.4* 3.5 - 5.1 mmol/L Final  . Chloride 08/22/2014 86* 96 - 112 mmol/L Final  . CO2 08/22/2014 29  19 - 32 mmol/L Final  . Glucose, Bld 08/22/2014 109* 70 - 99 mg/dL Final  . BUN 08/22/2014 37* 6 - 23 mg/dL Final  . Creatinine, Ser 08/22/2014 1.46* 0.50 - 1.35 mg/dL Final  . Calcium 08/22/2014 8.7  8.4 - 10.5 mg/dL Final  . GFR calc non Af Amer 08/22/2014 44* >90 mL/min Final  . GFR calc Af Amer 08/22/2014 51* >90 mL/min Final    Comment: (NOTE) The eGFR has been calculated using the CKD EPI equation. This calculation has not been validated in all clinical situations. eGFR's persistently <90 mL/min signify possible Chronic Kidney Disease.   . Anion gap 08/22/2014 11  5 - 15 Final  . Prothrombin Time 08/22/2014 23.3* 11.6 - 15.2 seconds Final  . INR 08/22/2014 2.05* 0.00 - 1.49 Final  . Prothrombin Time 08/23/2014 24.8* 11.6 - 15.2 seconds Final  . INR 08/23/2014 2.22* 0.00 - 1.49 Final  Anti-coag visit on 08/19/2014  Component Date Value Ref Range Status  . INR 08/19/2014 2.5   Final   [name]  Admission on 08/13/2014, Discharged on 08/17/2014  Component Date Value Ref Range Status  . WBC 08/13/2014 12.8* 4.0 - 10.5 K/uL Final  . RBC 08/13/2014 4.61  4.22 - 5.81 MIL/uL Final  . Hemoglobin 08/13/2014 12.4* 13.0 - 17.0 g/dL Final  . HCT 08/13/2014 37.6* 39.0 - 52.0 % Final  . MCV 08/13/2014 81.6  78.0 - 100.0 fL Final  . MCH 08/13/2014 26.9  26.0 - 34.0 pg Final  . MCHC 08/13/2014 33.0  30.0 - 36.0 g/dL Final  . RDW 08/13/2014 18.6* 11.5 - 15.5 % Final  . Platelets 08/13/2014 359  150 - 400 K/uL Final  . Sodium 08/13/2014 129* 135 - 145 mmol/L Final  . Potassium 08/13/2014 3.5  3.5 - 5.1 mmol/L Final  . Chloride 08/13/2014 90* 96 - 112 mmol/L Final  . CO2 08/13/2014 26  19 - 32 mmol/L Final  . Glucose, Bld 08/13/2014 153* 70 - 99 mg/dL Final  . BUN 08/13/2014 44* 6 - 23 mg/dL Final  . Creatinine, Ser 08/13/2014 1.37* 0.50 - 1.35 mg/dL Final  . Calcium 08/13/2014 8.9  8.4 - 10.5 mg/dL Final  . GFR calc non Af Amer 08/13/2014 47* >90 mL/min Final  . GFR calc Af Amer 08/13/2014 55* >90 mL/min Final   Comment: (NOTE) The eGFR has been calculated using the CKD EPI equation. This calculation has not been validated in all clinical situations. eGFR's persistently <90 mL/min signify possible Chronic Kidney Disease.   . Anion gap 08/13/2014 13  5 - 15 Final  . Troponin i, poc 08/13/2014 0.01  0.00 - 0.08 ng/mL  Final  . Comment 3 08/13/2014          Final   Comment: Due to the release kinetics of cTnI, a negative result within the first hours of the onset of symptoms does not rule out myocardial infarction with certainty. If myocardial infarction is still suspected, repeat the test at appropriate intervals.   . B Natriuretic Peptide 08/13/2014 2249.6* 0.0 - 100.0 pg/mL Final  . Prothrombin Time 08/13/2014  20.6* 11.6 - 15.2 seconds Final  . INR 08/13/2014 1.75* 0.00 - 1.49 Final  . Color, Urine 08/13/2014 YELLOW  YELLOW Final  . APPearance 08/13/2014 CLEAR  CLEAR Final  . Specific Gravity, Urine 08/13/2014 1.008  1.005 - 1.030 Final  . pH 08/13/2014 7.0  5.0 - 8.0 Final  . Glucose, UA 08/13/2014 NEGATIVE  NEGATIVE mg/dL Final  . Hgb urine dipstick 08/13/2014 NEGATIVE  NEGATIVE Final  . Bilirubin Urine 08/13/2014 NEGATIVE  NEGATIVE Final  . Ketones, ur 08/13/2014 NEGATIVE  NEGATIVE mg/dL Final  . Protein, ur 08/13/2014 NEGATIVE  NEGATIVE mg/dL Final  . Urobilinogen, UA 08/13/2014 0.2  0.0 - 1.0 mg/dL Final  . Nitrite 08/13/2014 NEGATIVE  NEGATIVE Final  . Leukocytes, UA 08/13/2014 NEGATIVE  NEGATIVE Final   MICROSCOPIC NOT DONE ON URINES WITH NEGATIVE PROTEIN, BLOOD, LEUKOCYTES, NITRITE, OR GLUCOSE <1000 mg/dL.  Marland Kitchen Sodium 08/14/2014 129* 135 - 145 mmol/L Final  . Potassium 08/14/2014 2.8* 3.5 - 5.1 mmol/L Final   DELTA CHECK NOTED  . Chloride 08/14/2014 90* 96 - 112 mmol/L Final  . CO2 08/14/2014 26  19 - 32 mmol/L Final  . Glucose, Bld 08/14/2014 150* 70 - 99 mg/dL Final  . BUN 08/14/2014 42* 6 - 23 mg/dL Final  . Creatinine, Ser 08/14/2014 1.31  0.50 - 1.35 mg/dL Final  . Calcium 08/14/2014 8.7  8.4 - 10.5 mg/dL Final  . GFR calc non Af Amer 08/14/2014 50* >90 mL/min Final  . GFR calc Af Amer 08/14/2014 58* >90 mL/min Final   Comment: (NOTE) The eGFR has been calculated using the CKD EPI equation. This calculation has not been validated in all clinical situations. eGFR's persistently  <90 mL/min signify possible Chronic Kidney Disease.   . Anion gap 08/14/2014 13  5 - 15 Final  . Total hemoglobin 08/14/2014 10.5* 13.5 - 18.0 g/dL Final  . O2 Saturation 08/14/2014 59.2   Final  . Carboxyhemoglobin 08/14/2014 1.5  0.5 - 1.5 % Final  . Methemoglobin 08/14/2014 0.9  0.0 - 1.5 % Final  . Prothrombin Time 08/14/2014 22.4* 11.6 - 15.2 seconds Final  . INR 08/14/2014 1.95* 0.00 - 1.49 Final  . Sodium 08/15/2014 128* 135 - 145 mmol/L Final  . Potassium 08/15/2014 3.6  3.5 - 5.1 mmol/L Final   DELTA CHECK NOTED  . Chloride 08/15/2014 92* 96 - 112 mmol/L Final  . CO2 08/15/2014 27  19 - 32 mmol/L Final  . Glucose, Bld 08/15/2014 134* 70 - 99 mg/dL Final  . BUN 08/15/2014 45* 6 - 23 mg/dL Final  . Creatinine, Ser 08/15/2014 1.32  0.50 - 1.35 mg/dL Final  . Calcium 08/15/2014 8.5  8.4 - 10.5 mg/dL Final  . GFR calc non Af Amer 08/15/2014 50* >90 mL/min Final  . GFR calc Af Amer 08/15/2014 58* >90 mL/min Final   Comment: (NOTE) The eGFR has been calculated using the CKD EPI equation. This calculation has not been validated in all clinical situations. eGFR's persistently <90 mL/min signify possible Chronic Kidney Disease.   . Anion gap 08/15/2014 9  5 - 15 Final  . Prothrombin Time 08/15/2014 23.5* 11.6 - 15.2 seconds Final  . INR 08/15/2014 2.07* 0.00 - 1.49 Final  . Sodium 08/16/2014 127* 135 - 145 mmol/L Final  . Potassium 08/16/2014 4.8  3.5 - 5.1 mmol/L Final   DELTA CHECK NOTED  . Chloride 08/16/2014 91* 96 - 112 mmol/L Final  . CO2 08/16/2014 28  19 - 32 mmol/L Final  . Glucose, Bld  08/16/2014 139* 70 - 99 mg/dL Final  . BUN 08/16/2014 49* 6 - 23 mg/dL Final  . Creatinine, Ser 08/16/2014 1.44* 0.50 - 1.35 mg/dL Final  . Calcium 08/16/2014 8.8  8.4 - 10.5 mg/dL Final  . GFR calc non Af Amer 08/16/2014 45* >90 mL/min Final  . GFR calc Af Amer 08/16/2014 52* >90 mL/min Final   Comment: (NOTE) The eGFR has been calculated using the CKD EPI equation. This calculation  has not been validated in all clinical situations. eGFR's persistently <90 mL/min signify possible Chronic Kidney Disease.   . Anion gap 08/16/2014 8  5 - 15 Final  . Prothrombin Time 08/16/2014 27.5* 11.6 - 15.2 seconds Final  . INR 08/16/2014 2.54* 0.00 - 1.49 Final  . Sodium 08/17/2014 127* 135 - 145 mmol/L Final  . Potassium 08/17/2014 4.3  3.5 - 5.1 mmol/L Final  . Chloride 08/17/2014 86* 96 - 112 mmol/L Final  . CO2 08/17/2014 31  19 - 32 mmol/L Final  . Glucose, Bld 08/17/2014 135* 70 - 99 mg/dL Final  . BUN 08/17/2014 55* 6 - 23 mg/dL Final  . Creatinine, Ser 08/17/2014 1.49* 0.50 - 1.35 mg/dL Final  . Calcium 08/17/2014 8.6  8.4 - 10.5 mg/dL Final  . GFR calc non Af Amer 08/17/2014 43* >90 mL/min Final  . GFR calc Af Amer 08/17/2014 50* >90 mL/min Final   Comment: (NOTE) The eGFR has been calculated using the CKD EPI equation. This calculation has not been validated in all clinical situations. eGFR's persistently <90 mL/min signify possible Chronic Kidney Disease.   . Anion gap 08/17/2014 10  5 - 15 Final  . Prothrombin Time 08/17/2014 27.5* 11.6 - 15.2 seconds Final  . INR 08/17/2014 2.54* 0.00 - 1.49 Final  Anti-coag visit on 08/12/2014  Component Date Value Ref Range Status  . INR 08/12/2014 2.0   Final  There may be more visits with results that are not included.  Dg Chest 2 View  09/21/2014   CLINICAL DATA:  Chest pain and shortness of breath beginning yesterday. History of cardiac catheterization.  EXAM: CHEST  2 VIEW  COMPARISON:  Chest radiograph September 16, 2014  FINDINGS: The cardiac silhouette is moderately enlarged, unchanged. Similar pulmonary vascular congestion. Calcified aortic knob. Bibasilar strandy densities with mildly elevated RIGHT hemidiaphragm. No floor pleural effusion or focal consolidation. No pneumothorax.  Tunneled hit vein catheter via RIGHT internal jugular central venous approach with distal tip projecting in distal superior vena cava. LEFT  cardiac defibrillator in situ. Patient is osteopenic.  IMPRESSION: Stable cardiomegaly and pulmonary vascular congestion. Bibasilar atelectasis.   Electronically Signed   By: Elon Alas   On: 09/21/2014 06:38   Dg Chest 2 View  09/16/2014   CLINICAL DATA:  Acute onset of shortness breath and leg swelling. Initial encounter.  EXAM: CHEST  2 VIEW  COMPARISON:  Chest radiograph performed 09/15/2014  FINDINGS: The lungs are well-aerated. Vascular congestion is noted. Mild bibasilar opacities appear mildly worsened, raising concern for mildly worsened interstitial edema. No definite pleural effusion or pneumothorax is seen.  The heart is enlarged. A pacemaker/AICD is noted at the left chest wall, with leads ending at the right atrium, right ventricle and coronary sinus. A right-sided catheter is noted ending within the proximal right atrium. No acute osseous abnormalities are seen.  IMPRESSION: Vascular congestion and cardiomegaly. Mild bibasilar airspace opacities appear mildly worsened, raising concern for mildly worsened interstitial edema.   Electronically Signed   By: Francoise Schaumann.D.  On: 09/16/2014 03:32   Dg Chest 2 View  09/15/2014   CLINICAL DATA:  Increasing shortness of breath.  Chest pain.  EXAM: CHEST  2 VIEW  COMPARISON:  08/31/2014  FINDINGS: Multi lead left-sided pacemaker remains in place. Tip of the right central line in the atrial caval junction. Cardiomegaly is unchanged. Pulmonary edema is unchanged. Trace bilateral pleural effusions. No confluent airspace disease. No pneumothorax.  IMPRESSION: Cardiomegaly and pulmonary edema, unchanged from prior exam. There are trace bilateral pleural effusions.   Electronically Signed   By: Jeb Levering M.D.   On: 09/15/2014 02:30   Ct Head Wo Contrast  09/25/2014   CLINICAL DATA:  Status post fall today with a blow to the back of the head. Initial encounter.  EXAM: CT HEAD WITHOUT CONTRAST  CT CERVICAL SPINE WITHOUT CONTRAST  TECHNIQUE:  Multidetector CT imaging of the head and cervical spine was performed following the standard protocol without intravenous contrast. Multiplanar CT image reconstructions of the cervical spine were also generated.  COMPARISON:  Head CT scan 06/11/2012.  FINDINGS: CT HEAD FINDINGS  Cortical atrophy and chronic microvascular ischemic change are identified. There is no evidence of acute intracranial abnormality including hemorrhage, infarct, mass lesion, mass effect, midline shift or abnormal extra-axial fluid collection. No hydrocephalus or pneumocephalus. The calvarium is intact. Imaged paranasal sinuses and mastoid air cells are clear.  CT CERVICAL SPINE FINDINGS  No fracture or malalignment of the cervical spine is identified. Loss of disc space height and endplate spurring appear worst at C5-6 and C6-7. Multilevel facet degenerative change is seen. Right IJ approach central venous catheter and left-side pacing leads are noted.  IMPRESSION: No acute abnormality head or cervical spine.  Atrophy and chronic microvascular ischemic change.  Cervical spondylosis.   Electronically Signed   By: Inge Rise M.D.   On: 09/25/2014 11:13   Ct Cervical Spine Wo Contrast  09/25/2014   CLINICAL DATA:  Status post fall today with a blow to the back of the head. Initial encounter.  EXAM: CT HEAD WITHOUT CONTRAST  CT CERVICAL SPINE WITHOUT CONTRAST  TECHNIQUE: Multidetector CT imaging of the head and cervical spine was performed following the standard protocol without intravenous contrast. Multiplanar CT image reconstructions of the cervical spine were also generated.  COMPARISON:  Head CT scan 06/11/2012.  FINDINGS: CT HEAD FINDINGS  Cortical atrophy and chronic microvascular ischemic change are identified. There is no evidence of acute intracranial abnormality including hemorrhage, infarct, mass lesion, mass effect, midline shift or abnormal extra-axial fluid collection. No hydrocephalus or pneumocephalus. The calvarium is  intact. Imaged paranasal sinuses and mastoid air cells are clear.  CT CERVICAL SPINE FINDINGS  No fracture or malalignment of the cervical spine is identified. Loss of disc space height and endplate spurring appear worst at C5-6 and C6-7. Multilevel facet degenerative change is seen. Right IJ approach central venous catheter and left-side pacing leads are noted.  IMPRESSION: No acute abnormality head or cervical spine.  Atrophy and chronic microvascular ischemic change.  Cervical spondylosis.   Electronically Signed   By: Inge Rise M.D.   On: 09/25/2014 11:13   Dg Chest Portable 1 View  08/31/2014   CLINICAL DATA:  Chest pain  EXAM: PORTABLE CHEST - 1 VIEW  COMPARISON:  08/29/2014  FINDINGS: Right jugular central line extends to the right atrium. There are intact appearances of the transvenous leads. There is unchanged cardiomegaly and aortic tortuosity. There is partial clearance of alveolar edema compared to the prior study.  IMPRESSION: Improved, with partial clearance of edema since 08/29/2014. Unchanged cardiomegaly.   Electronically Signed   By: Andreas Newport M.D.   On: 08/31/2014 23:14   Dg Abd Portable 1v  09/02/2014   CLINICAL DATA:  Acute abdominal pain.  EXAM: PORTABLE ABDOMEN - 1 VIEW  COMPARISON:  Abdomen and pelvis CT obtained on 07/05/2014.  FINDINGS: Borderline dilated small bowel loops. No gross free peritoneal air. Lumbar and lower thoracic spine degenerative changes. Cardiac pacer and AICD leads.  IMPRESSION: Minimal small bowel ileus or partial obstruction.   Electronically Signed   By: Claudie Revering M.D.   On: 09/02/2014 11:15     Assessment/Plan    ICD-9-CM ICD-10-CM   1. Chronic systolic/diastolic heart failure; end stage with EF 20% - cont meds 428.22 I50.22   2. Long-term (current) use of anticoagulants due to #3 V58.61 Z79.01   3. Paroxysmal atrial fibrillation - rate controlled; cont amio 427.31 I48.0   4. ICD (implantable cardioverter-defibrillator),  biventricular, in situ - due to #1 V45.02 Z95.810   5. CKD (chronic kidney disease) stage 3, GFR 30-59 ml/min - stable 585.3 N18.3   6. Essential hypertension - controlled; cont meds 401.9 I10   7. Disorder of electrolytes - improved 276.9 E87.8    --continue current meds as ordered. Maintain milrinone gtt per cardio  --f/u with cardio as scheduled 10/07/14  --daily weights  --fluid restrict 1800 cc daily  --BMP and INR weekly as ordered  --activity as tolerated  --GOAL: short term rehab potentially transitioning to long term care due to end stage HF. Communicated with pt and nursing.  --will follow  Kalayla Shadden S. Perlie Gold  [facility] [address] [place], [place] [address] [phone] Office (Wednesdays and Fridays 8 AM - 5 PM) [phone] Cell (Monday-Friday 8 AM - 5 PM)

## 2014-10-01 ENCOUNTER — Encounter (HOSPITAL_COMMUNITY): Payer: Medicare Other

## 2014-10-07 ENCOUNTER — Inpatient Hospital Stay (HOSPITAL_COMMUNITY): Admit: 2014-10-07 | Payer: Medicare Other

## 2014-10-07 ENCOUNTER — Ambulatory Visit (INDEPENDENT_AMBULATORY_CARE_PROVIDER_SITE_OTHER): Payer: Medicare Other | Admitting: Interventional Cardiology

## 2014-10-07 ENCOUNTER — Telehealth (HOSPITAL_COMMUNITY): Payer: Self-pay | Admitting: *Deleted

## 2014-10-07 DIAGNOSIS — I4891 Unspecified atrial fibrillation: Secondary | ICD-10-CM

## 2014-10-07 LAB — POCT INR: INR: 3.3

## 2014-10-07 NOTE — Telephone Encounter (Signed)
Amiee, RN called to review pt's medications, pt left [facility] yesterday and she is seeing pt today to readmit to [facility], in reviewing d/c summary meds they do not match the list pt has from [facility].  Advised to have pt take what is on the d/c summary from [facility], she will fill pill box and get labs today.  Mag-ox, amio andmetoclopramide were not on the list patient has, she will restart.  Appt resch from today to 4/21

## 2014-10-09 ENCOUNTER — Ambulatory Visit (HOSPITAL_COMMUNITY)
Admission: RE | Admit: 2014-10-09 | Discharge: 2014-10-09 | Disposition: A | Discharge status: Home / Self Care | Payer: Medicare Other | Source: Ambulatory Visit | Attending: Internal Medicine | Admitting: Internal Medicine

## 2014-10-09 ENCOUNTER — Encounter (HOSPITAL_COMMUNITY): Payer: Self-pay

## 2014-10-09 ENCOUNTER — Telehealth (HOSPITAL_COMMUNITY): Payer: Self-pay

## 2014-10-09 ENCOUNTER — Emergency Department (HOSPITAL_COMMUNITY)
Admission: EM | Admit: 2014-10-09 | Discharge: 2014-10-09 | Discharge status: Absent without leave | Payer: Medicare Other

## 2014-10-09 VITALS — BP 86/46 | HR 81 | Resp 22 | Wt 135.5 lb

## 2014-10-09 DIAGNOSIS — Z95810 Presence of automatic (implantable) cardiac defibrillator: Secondary | ICD-10-CM | POA: Diagnosis not present

## 2014-10-09 DIAGNOSIS — I272 Other secondary pulmonary hypertension: Secondary | ICD-10-CM | POA: Diagnosis not present

## 2014-10-09 DIAGNOSIS — I480 Paroxysmal atrial fibrillation: Secondary | ICD-10-CM | POA: Diagnosis not present

## 2014-10-09 DIAGNOSIS — E785 Hyperlipidemia, unspecified: Secondary | ICD-10-CM | POA: Insufficient documentation

## 2014-10-09 DIAGNOSIS — I5022 Chronic systolic (congestive) heart failure: Secondary | ICD-10-CM

## 2014-10-09 DIAGNOSIS — E876 Hypokalemia: Secondary | ICD-10-CM | POA: Insufficient documentation

## 2014-10-09 DIAGNOSIS — I129 Hypertensive chronic kidney disease with stage 1 through stage 4 chronic kidney disease, or unspecified chronic kidney disease: Secondary | ICD-10-CM | POA: Diagnosis not present

## 2014-10-09 DIAGNOSIS — I270 Primary pulmonary hypertension: Secondary | ICD-10-CM

## 2014-10-09 DIAGNOSIS — Z8673 Personal history of transient ischemic attack (TIA), and cerebral infarction without residual deficits: Secondary | ICD-10-CM | POA: Diagnosis not present

## 2014-10-09 DIAGNOSIS — G40909 Epilepsy, unspecified, not intractable, without status epilepticus: Secondary | ICD-10-CM | POA: Diagnosis not present

## 2014-10-09 DIAGNOSIS — I2510 Atherosclerotic heart disease of native coronary artery without angina pectoris: Secondary | ICD-10-CM | POA: Diagnosis not present

## 2014-10-09 DIAGNOSIS — Z79899 Other long term (current) drug therapy: Secondary | ICD-10-CM | POA: Insufficient documentation

## 2014-10-09 DIAGNOSIS — N183 Chronic kidney disease, stage 3 unspecified: Secondary | ICD-10-CM

## 2014-10-09 DIAGNOSIS — Z7901 Long term (current) use of anticoagulants: Secondary | ICD-10-CM | POA: Insufficient documentation

## 2014-10-09 MED ORDER — METOLAZONE 2.5 MG PO TABS: ORAL_TABLET | ORAL | Status: DC

## 2014-10-09 MED ORDER — POTASSIUM CHLORIDE CRYS ER 20 MEQ PO TBCR: 40.0000 meq | EXTENDED_RELEASE_TABLET | Freq: Two times a day (BID) | ORAL | Status: AC

## 2014-10-09 NOTE — Patient Instructions (Signed)
Comience Metolazone 2,5 mg ( 1 comprimido) una vez los lunes y viernes .  Disminucin de potasio a 40 meq ( 2 comprimidos Marlette ) .  Volver en 2 semanas .  Haga lo siguiente las cosas cotidianas : Pesarse por la maana antes del desayuno .  Anotarla y guardarla en un registro .  Tome sus medicamentos segn lo prescrito [place] sal sal alimentos -Limit baja ( de sodio ) a 2000 mg por da.  Mantenerse tan activo como sea posible todos [place].  Limitar los lquidos para que el da a menos de 2 litros

## 2014-10-09 NOTE — Progress Notes (Signed)
Patient ID: Dallen Bunte, male   DOB: May 14, 1933, 81 y.o.   MRN: [id number]  [employer]  HPI: Mr. Moan is a 81 y.o. [name] (non-english speaking) w/ PMHx significant for chronic systolic CHF (EF 62%), NICM, LBBB, PAF (on coumadin), HTN, HLD, CVA, Pulmonary Hypertension and h/o seizure d/o (2/2 CVA 05/2012).   Admitted to [facility] 06/2012 with acute decompensated heart failure EF 20%. As noted below, also diagnosed with severe pulmonary hypertension (PAPs ~ 90) from initial RHC and placed on Milrinone and Revatio (eventually transitioned to tadalafil 40) with marked clinical response and decrease in PA pressures. VQ low probability for pulmonary embolus.  Discharged on Milrinone at 0.375 mcg via PICC. Discharge Weight 146 pounds. Attempted to wean milrinone in 9/14 but failed due to worsening HF and PAH. Milrinone restarted.  Admitted to the hospital 5/21-11/22/13. Found to have staph aureus bacteremia treated with IV abx. TEE negative for endocarditis. Also found to retroperitoneal mass and biopsy revealed benign lesion. He had evidence of low output thought to be due to atrial fibrillation and milrinone increased. Attempted TEE and DC-CV but had LAA clot so deferred cardioversion.  He was admitted in 12/15 with acute on chronic systolic CHF.  He was diuresed with IV Lasix and milrinone was increased to 0.5 mcg/kg/min.    Here for f/u: Remains on milrinone 0.5 mcg/kg/min. Over past 2 months has had numerous admits for HF. At last visit, we finally convinced him to go [facility] but he left within a week. Now back home. Scheduled to get IV lasix M/W/F with [facility]. Wife says she quit her job and is not at home with him and makes him takes his medications. Feeling better. Wants to drink water all day long. Dyspnea comes and goes. No significant edema. Weight up a few pounds. Refuses Hospice.   ICD interrogation: No VT/AF Fluid index trending back up but not at threshold. Activity level 0. BiV pacing 100%  NO  ACE-I 2/2 rash   08/14/12 S/P successful DC-CV of AF 10/26/12 S/P BiV-ICD implant 01/07/13 ECHO EF 20%, restrictive diastolic function, mildly decreased RV size and systolic fxn, mild to moderate MR, PA systolic pressure 59 mmHg  02/21/13 ECHO EF 20% PA pressure mean 59 11/12/13: ECHO EF 20-25%, trivial AI,  07/07/14: ECHO EF 15% moderate RV dysfunction PAP 85  Labs      (06/25/13) K 4.2 Creatinine 1.58 Magnesium 2.0     (07/16/13) K 4.2, Cr 1.67    (2/15) creatinine 1.97 => 1.94, K 4.6, HCT 39.4    (4/15) K 4.5, creatinine 1.56, pro-BNP 6912    (4/15) K 3.9, creatinine 2.39, mag 2.1     (09/2013) K 3.7, creatinine 2.03     11/26/13 K 4.6 cr 2.8 hgb 13.2     12/11/13 K 3.4 Creatinine 2.14  Magnesium 2.1     12/19/13: K+ 3.4, creatinine 2.42, BUN 57     01/14/14: K 3.8, creatinine 2.45, BUN 45, mag 2.1    12/15 K 3.6, creatinine 1.68, HCT 34.4     07/17/14: K 2.4 creatinine 1.51      08/22/14: K 3.4 creatinine  1.46     10/07/14: K 5.6 creatinine  1.51 Na 124  SH: Lives with his wife here in [place].   ROS: All systems negative except as listed in HPI, PMH and Problem List.  Past Medical History  Diagnosis Date  . HTN (hypertension)   . High cholesterol   . CHF (congestive heart failure)   .  BBBB (bilateral bundle branch block)   . LBBB (left bundle branch block)   . Seizures 06/11/2012    new onset/notes (06/11/2012)  . SOB (shortness of breath)     "sometimes when I lay down;; related to not taking my RX" (06/11/2012)  . Myocardial infarction     06/10/2012  . Atrial fibrillation   . Stroke   . Atrial thrombus     left  . Pulmonary HTN   . Atrial fibrillation 06/12/2012  . Coronary artery disease     Current Outpatient Prescriptions  Medication Sig Dispense Refill  . acetaminophen (TYLENOL) 325 MG tablet Take 2 tablets (650 mg total) by mouth every 6 (six) hours as needed for mild pain (or Fever >/= 101).    Marland Kitchen amiodarone (PACERONE) 200 MG tablet Take 1 tablet (200 mg total) by  mouth daily. 30 tablet 0  . LORazepam (ATIVAN) 1 MG tablet Take 1 tablet (1 mg total) by mouth at bedtime as needed for anxiety. 30 tablet 0  . magnesium oxide (MAG-OX) 400 MG tablet Take 1 tablet (400 mg total) by mouth 2 (two) times daily. (Patient not taking: Reported on 09/21/2014) 60 tablet 3  . metoCLOPramide (REGLAN) 10 MG tablet Take 1 tablet (10 mg total) by mouth 2 (two) times daily. 60 tablet 3  . milrinone (PRIMACOR) 20 MG/100ML SOLN infusion Inject 33.95 mcg/min into the vein continuous. 100 mL 5  . polyethylene glycol (MIRALAX / GLYCOLAX) packet Take 17 g by mouth daily. 14 each 6  . potassium chloride SA (K-DUR,KLOR-CON) 20 MEQ tablet Take 2 tablets (40 mEq total) by mouth 3 (three) times daily. 120 tablet 3  . sildenafil (REVATIO) 20 MG tablet Take 1 tablet (20 mg total) by mouth 3 (three) times daily. 90 tablet 3  . sorbitol 70 % SOLN Take 30 mLs by mouth daily. 500 mL 3  . spironolactone (ALDACTONE) 25 MG tablet Take 1 tablet (25 mg total) by mouth 2 (two) times daily. 60 tablet 5  . torsemide (DEMADEX) 20 MG tablet Take 4 tablets (80 mg total) by mouth 2 (two) times daily. Replace Monday Wednesday and Friday morning demadex with 80mg  IV lasix. [facility] to manage 240 tablet 3  . warfarin (COUMADIN) 4 MG tablet Take 1 tablet (4 mg total) by mouth daily at 6 PM. Take 4 mg Monday, Wednesday, Friday and Sunday. 2 mg on Tues, Thurs and Sat (Patient taking differently: Take 2-4 mg by mouth daily at 6 PM. Take 4 mg Monday, Wednesday, Friday and Sunday. 2 mg on Tues, Thurs and Sat) 45 tablet 3   No current facility-administered medications for this encounter.    Filed Vitals:   10/09/14 1037  BP: 86/46  Pulse: 81  Resp: 22  Weight: 135 lb 8 oz (61.462 kg)  SpO2: 98%   PHYSICAL EXAM: General:  Elderly, NAD.No resp difficulty; wife and interpreter present Spero Geralds) HEENT: normal Neck: supple. JVP 9; Carotids 2+ bilaterally; no bruits. No lymphadenopathy or thryomegaly appreciated.  Cor:  PMI laterally displaced. Regular rhythm and rate. + s3 or murmur.  Lungs: clear Abdomen: soft, nontender, mildy distended. No hepatosplenomegaly. No bruits or masses. Good bowel sounds. Extremities: no cyanosis, clubbing, rash. 2+ edema .   Neuro: alert & orientedx3, cranial nerves grossly intact. Moves all 4 extremities w/o difficulty. Affect pleasant.   ASSESSMENT & PLAN:  1. Chronic systolic CHF: NICM, EF 37% s/p Medtronic CRT-D; remains on milrinone 0.5 mcg/kg/min. Remains NYHA IIIb with multiple recent hospitalizations in setting of  dietary and medication noncompliance. Volume status up.  He is not interested in palliative care or hospice at this time. He is a DNR.  - Mildly overloaded today.  - Will continue lasix 80 IV M/W/F with [facility]. Add zaroxolyn 2.5 on Monday and Friday. Stressed need for fluid restriction. - Continue milrinone 0.5 mcg through PICC and will get weekly labs.   - Not on BB with history of low output and milrinone.  - He is not on ACE-I due to rash and no longer on ARB or spironolactone with renal function. - Continue current spironolactone to help with hypokalemia  - Discussed the importance of daily weights, a low sodium diet, and fluid restriction (less than 2 L a day). Instructed to call the HF clinic if weight increases more than 3 lbs overnight or 5 lbs in a week.  2. Paroxysmal atrial fibrillation/flutter: Cardioverted back to NSR 12/27/13. Appears to be in NSR on amiodraone. Will continue amio. Continue coumadin.  Continue to follow LFTs and TFTs.  3. Pulmonary arterial hypertension:  Continue revatio 20 mg TID 40 mg and milrinone.  4. CKD stage III: Stable. Continue to follow.  5. Abdominal discomfort/distension: This persists. Will start reglan 10 mg bid. Get gastric emptying study and refer to GI.  6. Hypokalemia: Now he is apparently taking his potassium (was throwing away before) K up. Will decrease KCL to 40 bid. Recheck BMET next week.  ICD interrogated  personally and discussed with him.    Benay Spice 10/09/2014

## 2014-10-09 NOTE — Telephone Encounter (Signed)
Spoke with patient's [facility] RN [name] about changes made during today's visit in the clinic.  Patient to take Metolazone 2.5mg  one tablet on Mondays and Fridays.  Patient also to decrease potassium to 40 meq twice daily.  [name] given his next apt date and time of 10/23/14 at 11:40 am.  Voices understanding.  Renee Pain

## 2014-10-10 ENCOUNTER — Telehealth (HOSPITAL_COMMUNITY): Payer: Self-pay | Admitting: Cardiology

## 2014-10-10 NOTE — Telephone Encounter (Signed)
Only hold if [name] < 80

## 2014-10-10 NOTE — Telephone Encounter (Signed)
[name], RN aware

## 2014-10-10 NOTE — Telephone Encounter (Signed)
[name],RN with [facility] called to request b/p parameters  Pt is scheduled for IV lasix three times per week and often b/p is low. Pt is always asymptomatic   Today b/p is 80/50 weight 133lbs   Please advise

## 2014-10-13 ENCOUNTER — Ambulatory Visit (INDEPENDENT_AMBULATORY_CARE_PROVIDER_SITE_OTHER): Payer: Medicare Other | Admitting: Pharmacist

## 2014-10-13 ENCOUNTER — Other Ambulatory Visit (HOSPITAL_COMMUNITY): Payer: Self-pay

## 2014-10-13 ENCOUNTER — Telehealth (HOSPITAL_COMMUNITY): Payer: Self-pay

## 2014-10-13 DIAGNOSIS — I5020 Unspecified systolic (congestive) heart failure: Secondary | ICD-10-CM

## 2014-10-13 DIAGNOSIS — I4891 Unspecified atrial fibrillation: Secondary | ICD-10-CM

## 2014-10-13 LAB — POCT INR: INR: 5.9

## 2014-10-13 NOTE — Telephone Encounter (Signed)
Critical lab value called on patient INR 5.6 Will forward to [facility] coumadin clinic to eval and treat.  Renee Pain

## 2014-10-13 NOTE — Telephone Encounter (Signed)
HH RN [name] with [facility] called to report PICC line partially pulled out approx. 3 cm.  Sutures have come out.  Milrinone still infusing without alarms, patient appears stable at baseline status, however red port is difficult to draw back blood return.  Ordered for new PICC to be placed with IR services, first available outpatient appointment is Wednesday at 8 am.  Nurse [name] made aware, will relay info to patient and wife with interpreter services.  [name] has secured line with new reinforced dressing, but if line pulls out more or if alarms start on milrinone will call us back or have him come in after hours to have it replaced sooner. Renee Pain

## 2014-10-13 NOTE — Telephone Encounter (Signed)
POCT INR was 5.9.  Dosed Coumadin based on this result earlier today.  See Anti-coag note for details.  Will continue plans as discussed earlier today with [name], RN with [facility].

## 2014-10-15 ENCOUNTER — Ambulatory Visit (HOSPITAL_COMMUNITY)
Admission: RE | Admit: 2014-10-15 | Discharge: 2014-10-15 | Disposition: A | Discharge status: Home / Self Care | Payer: Medicare Other | Source: Ambulatory Visit | Attending: Interventional Radiology | Admitting: Interventional Radiology

## 2014-10-15 ENCOUNTER — Other Ambulatory Visit (HOSPITAL_COMMUNITY): Payer: Self-pay | Admitting: Cardiology

## 2014-10-15 ENCOUNTER — Telehealth (HOSPITAL_COMMUNITY): Payer: Self-pay | Admitting: *Deleted

## 2014-10-15 DIAGNOSIS — Y848 Other medical procedures as the cause of abnormal reaction of the patient, or of later complication, without mention of misadventure at the time of the procedure: Secondary | ICD-10-CM | POA: Diagnosis not present

## 2014-10-15 DIAGNOSIS — T82898A Other specified complication of vascular prosthetic devices, implants and grafts, initial encounter: Secondary | ICD-10-CM | POA: Insufficient documentation

## 2014-10-15 DIAGNOSIS — I5020 Unspecified systolic (congestive) heart failure: Secondary | ICD-10-CM

## 2014-10-15 MED ORDER — LIDOCAINE HCL 1 % IJ SOLN
INTRAMUSCULAR | Status: AC
  Filled 2014-10-15: qty 20

## 2014-10-15 MED ORDER — CHLORHEXIDINE GLUCONATE 4 % EX LIQD
CUTANEOUS | Status: AC
Start: 2014-10-15 — End: 2014-10-15
  Filled 2014-10-15: qty 15

## 2014-10-15 MED ORDER — HEPARIN SOD (PORK) LOCK FLUSH 100 UNIT/ML IV SOLN
INTRAVENOUS | Status: AC
  Filled 2014-10-15: qty 5

## 2014-10-15 NOTE — Procedures (Signed)
Successful RT IJ TUNNELED PICC EXCHG No comp Stable Tip svc/ra

## 2014-10-15 NOTE — Telephone Encounter (Signed)
Amiee, RN called she is at pt's home to administer his IV Lasix and his BP is 78, she states wt is down to 131 from 135, and there is only very trace edema in LE, per Dr Haroldine Laws hold IV Lasix today, have pt drink extra glass of water.  Amiee, RN aware and agreeable, she will f/u with pt again tomorrow

## 2014-10-17 ENCOUNTER — Telehealth (HOSPITAL_COMMUNITY): Payer: Self-pay | Admitting: Surgery

## 2014-10-17 ENCOUNTER — Telehealth (HOSPITAL_COMMUNITY): Payer: Self-pay | Admitting: *Deleted

## 2014-10-17 ENCOUNTER — Telehealth (HOSPITAL_COMMUNITY): Payer: Self-pay | Admitting: Vascular Surgery

## 2014-10-17 NOTE — Telephone Encounter (Signed)
Amiee, Rn is out seeing pt again today for IV Lasix, she has called to report pt's wt is down to 127.6 today, he was 132 4/22 and 134.2 on 4/19 when he was discharged.  She states his ankles are measuring smaller at 18.5 from 21.5 and abd girth is 99 from 102, lungs CTA BP 90/50 today.  Discussed with Dr Aundra Dubin, he would like pt to decrease IV Lasix to only 80 mg once a week and take Torsemide 80 mg Twice daily except on IV Lasix days only take 1 dose.  Have called Amiee, RN back and left a detailed mess with this info t/c/b with questions

## 2014-10-17 NOTE — Telephone Encounter (Signed)
[name] called she would like to speak to someone about pt Spironlactone

## 2014-10-17 NOTE — Telephone Encounter (Signed)
I called Tyler Christensen back to clarify the changes in Mr. Pullman' s medications.  I spoke with Dr. Aundra Dubin and he said to hold IV Lasix today and then resume Torsemide 80 mg BID except on scheduled IV Lasix day.  He also said to leave Metolazone and Spirolactone as he was previously taking.  Tyler Christensen acknowledges understanding and plans to see him again on Monday.

## 2014-10-20 ENCOUNTER — Encounter: Payer: Medicare Other | Admitting: *Deleted

## 2014-10-20 ENCOUNTER — Telehealth: Payer: Self-pay | Admitting: Cardiology

## 2014-10-20 ENCOUNTER — Ambulatory Visit (INDEPENDENT_AMBULATORY_CARE_PROVIDER_SITE_OTHER): Payer: Medicare Other | Admitting: Pharmacist

## 2014-10-20 DIAGNOSIS — I4891 Unspecified atrial fibrillation: Secondary | ICD-10-CM

## 2014-10-20 LAB — POCT INR: INR: 2.1

## 2014-10-20 NOTE — Telephone Encounter (Signed)
Attempted to confirm remote transmission with pt. No answer and was unable to leave a message.   

## 2014-10-21 ENCOUNTER — Encounter: Payer: Self-pay | Admitting: Cardiology

## 2014-10-21 NOTE — Telephone Encounter (Signed)
This was addressed in other phone note

## 2014-10-22 ENCOUNTER — Telehealth (HOSPITAL_COMMUNITY): Payer: Self-pay | Admitting: *Deleted

## 2014-10-22 NOTE — Telephone Encounter (Signed)
Hold off on IV Lasix.

## 2014-10-22 NOTE — Telephone Encounter (Signed)
FYI: [name] with [facility] left a voice message- today she was supposed to start weekly IV lasix (per Dr. Aundra Dubin)  but pts BP is 78/50 his weight is 129 and his ankles are "tiny". She states pt is breathing and eating well. She will try again on Friday.

## 2014-10-23 ENCOUNTER — Telehealth (HOSPITAL_COMMUNITY): Payer: Self-pay

## 2014-10-23 ENCOUNTER — Ambulatory Visit (HOSPITAL_BASED_OUTPATIENT_CLINIC_OR_DEPARTMENT_OTHER)
Admission: RE | Admit: 2014-10-23 | Discharge: 2014-10-23 | Disposition: A | Discharge status: Home / Self Care | Payer: Medicare Other | Source: Ambulatory Visit | Attending: Internal Medicine | Admitting: Internal Medicine

## 2014-10-23 ENCOUNTER — Encounter (HOSPITAL_COMMUNITY): Payer: Self-pay

## 2014-10-23 ENCOUNTER — Encounter: Payer: Self-pay | Admitting: Internal Medicine

## 2014-10-23 VITALS — BP 80/44 | HR 78 | Resp 20 | Wt 129.5 lb

## 2014-10-23 DIAGNOSIS — N183 Chronic kidney disease, stage 3 (moderate): Secondary | ICD-10-CM | POA: Insufficient documentation

## 2014-10-23 DIAGNOSIS — R079 Chest pain, unspecified: Secondary | ICD-10-CM | POA: Diagnosis not present

## 2014-10-23 DIAGNOSIS — I129 Hypertensive chronic kidney disease with stage 1 through stage 4 chronic kidney disease, or unspecified chronic kidney disease: Secondary | ICD-10-CM | POA: Diagnosis present

## 2014-10-23 DIAGNOSIS — E785 Hyperlipidemia, unspecified: Secondary | ICD-10-CM | POA: Diagnosis present

## 2014-10-23 DIAGNOSIS — Z66 Do not resuscitate: Secondary | ICD-10-CM

## 2014-10-23 DIAGNOSIS — R140 Abdominal distension (gaseous): Secondary | ICD-10-CM

## 2014-10-23 DIAGNOSIS — I4892 Unspecified atrial flutter: Secondary | ICD-10-CM | POA: Diagnosis present

## 2014-10-23 DIAGNOSIS — I272 Other secondary pulmonary hypertension: Secondary | ICD-10-CM | POA: Diagnosis present

## 2014-10-23 DIAGNOSIS — I5022 Chronic systolic (congestive) heart failure: Secondary | ICD-10-CM | POA: Insufficient documentation

## 2014-10-23 DIAGNOSIS — Z79899 Other long term (current) drug therapy: Secondary | ICD-10-CM | POA: Insufficient documentation

## 2014-10-23 DIAGNOSIS — I2510 Atherosclerotic heart disease of native coronary artery without angina pectoris: Secondary | ICD-10-CM | POA: Diagnosis present

## 2014-10-23 DIAGNOSIS — Z7901 Long term (current) use of anticoagulants: Secondary | ICD-10-CM

## 2014-10-23 DIAGNOSIS — I480 Paroxysmal atrial fibrillation: Secondary | ICD-10-CM | POA: Diagnosis present

## 2014-10-23 DIAGNOSIS — Z9114 Patient's other noncompliance with medication regimen: Secondary | ICD-10-CM

## 2014-10-23 DIAGNOSIS — I447 Left bundle-branch block, unspecified: Secondary | ICD-10-CM | POA: Diagnosis present

## 2014-10-23 DIAGNOSIS — R21 Rash and other nonspecific skin eruption: Secondary | ICD-10-CM | POA: Diagnosis present

## 2014-10-23 DIAGNOSIS — E876 Hypokalemia: Secondary | ICD-10-CM | POA: Diagnosis present

## 2014-10-23 DIAGNOSIS — I252 Old myocardial infarction: Secondary | ICD-10-CM

## 2014-10-23 DIAGNOSIS — E780 Pure hypercholesterolemia: Secondary | ICD-10-CM

## 2014-10-23 DIAGNOSIS — G40909 Epilepsy, unspecified, not intractable, without status epilepticus: Secondary | ICD-10-CM | POA: Diagnosis present

## 2014-10-23 DIAGNOSIS — Z888 Allergy status to other drugs, medicaments and biological substances status: Secondary | ICD-10-CM

## 2014-10-23 DIAGNOSIS — I5023 Acute on chronic systolic (congestive) heart failure: Principal | ICD-10-CM | POA: Diagnosis present

## 2014-10-23 DIAGNOSIS — Z8673 Personal history of transient ischemic attack (TIA), and cerebral infarction without residual deficits: Secondary | ICD-10-CM

## 2014-10-23 DIAGNOSIS — N179 Acute kidney failure, unspecified: Secondary | ICD-10-CM | POA: Diagnosis present

## 2014-10-23 MED ORDER — AMIODARONE HCL 200 MG PO TABS: 200.0000 mg | ORAL_TABLET | Freq: Two times a day (BID) | ORAL | Status: DC

## 2014-10-23 NOTE — Telephone Encounter (Signed)
[name] HH RN made aware of changes made in clinic today.  Renee Pain

## 2014-10-23 NOTE — Patient Instructions (Signed)
La amiodarona aumentar a 200 mg [employer] .  Su enfermera [name] ir a su casa maana para laboratorios y Lasix intravenosa .  Seguimiento de 1 mes.

## 2014-10-23 NOTE — Progress Notes (Signed)
Interpreter Lesle Chris for Heart Failure Clinic

## 2014-10-23 NOTE — Progress Notes (Signed)
Patient ID: Tyler Christensen, male   DOB: May 31, 1933, 81 y.o.   MRN: [id number]  [employer]  HPI: Tyler Christensen is a 81 y.o. [name] (non-english speaking) w/ PMHx significant for chronic systolic CHF (EF 21%), NICM, LBBB, PAF (on coumadin), HTN, HLD, CVA, Pulmonary Hypertension and h/o seizure d/o (2/2 CVA 05/2012).   Admitted to [facility] 06/2012 with acute decompensated heart failure EF 20%. As noted below, also diagnosed with severe pulmonary hypertension (PAPs ~ 90) from initial RHC and placed on Milrinone and Revatio (eventually transitioned to tadalafil 40) with marked clinical response and decrease in PA pressures. VQ low probability for pulmonary embolus.  Discharged on Milrinone at 0.375 mcg via PICC. Discharge Weight 146 pounds. Attempted to wean milrinone in 9/14 but failed due to worsening HF and PAH. Milrinone restarted.  Admitted to the hospital 5/21-11/22/13. Found to have staph aureus bacteremia treated with IV abx. TEE negative for endocarditis. Also found to retroperitoneal mass and biopsy revealed benign lesion. He had evidence of low output thought to be due to atrial fibrillation and milrinone increased. Attempted TEE and DC-CV but had LAA clot so deferred cardioversion.  He was admitted in 12/15 with acute on chronic systolic CHF.  He was diuresed with IV Lasix and milrinone was increased to 0.5 mcg/kg/min.  Over past 2 months has had numerous admits for HF. At last visit, we finally convinced him to go [facility] but he left within a week. Now back home.  Here for f/u: Remains on milrinone 0.5 mcg/kg/min.  Scheduled to get IV lasix M/W/F with [facility]. Occassionaly lasix held due to [name] < 80. Got lasix today because more SOB and had some swelling. Weight 129. Denies drinking water or ice but wife says he is drinking a lot. Taking medications. Refuses Hospice.   ICD interrogation: No VT, has some AFlutter over past few days. (1.7%). 18 hour event yesterday. Now back in NSR. Fluid index over  threshold.  Activity level 0. BiV pacing dropped to 95%  NO ACE-I 2/2 rash   08/14/12 S/P successful DC-CV of AF 10/26/12 S/P BiV-ICD implant 01/07/13 ECHO EF 20%, restrictive diastolic function, mildly decreased RV size and systolic fxn, mild to moderate MR, PA systolic pressure 59 mmHg  02/21/13 ECHO EF 20% PA pressure mean 59 11/12/13: ECHO EF 20-25%, trivial AI,  07/07/14: ECHO EF 15% moderate RV dysfunction PAP 85  Labs      (06/25/13) K 4.2 Creatinine 1.58 Magnesium 2.0     (07/16/13) K 4.2, Cr 1.67    (2/15) creatinine 1.97 => 1.94, K 4.6, HCT 39.4    (4/15) K 4.5, creatinine 1.56, pro-BNP 6912    (4/15) K 3.9, creatinine 2.39, mag 2.1     (09/2013) K 3.7, creatinine 2.03     11/26/13 K 4.6 cr 2.8 hgb 13.2     12/11/13 K 3.4 Creatinine 2.14  Magnesium 2.1     12/19/13: K+ 3.4, creatinine 2.42, BUN 57     01/14/14: K 3.8, creatinine 2.45, BUN 45, mag 2.1    12/15 K 3.6, creatinine 1.68, HCT 34.4     07/17/14: K 2.4 creatinine 1.51      08/22/14: K 3.4 creatinine  1.46     10/07/14: K 5.6 creatinine  1.51 Na 124  SH: Lives with his wife here in [place].   ROS: All systems negative except as listed in HPI, PMH and Problem List.  Past Medical History  Diagnosis Date  . HTN (hypertension)   . High  cholesterol   . CHF (congestive heart failure)   . BBBB (bilateral bundle branch block)   . LBBB (left bundle branch block)   . Seizures 06/11/2012    new onset/notes (06/11/2012)  . SOB (shortness of breath)     "sometimes when I lay down;; related to not taking my RX" (06/11/2012)  . Myocardial infarction     06/10/2012  . Atrial fibrillation   . Stroke   . Atrial thrombus     left  . Pulmonary HTN   . Atrial fibrillation 06/12/2012  . Coronary artery disease     Current Outpatient Prescriptions  Medication Sig Dispense Refill  . acetaminophen (TYLENOL) 325 MG tablet Take 2 tablets (650 mg total) by mouth every 6 (six) hours as needed for mild pain (or Fever >/= 101).    Marland Kitchen  amiodarone (PACERONE) 200 MG tablet Take 1 tablet (200 mg total) by mouth daily. 30 tablet 0  . LORazepam (ATIVAN) 1 MG tablet Take 1 tablet (1 mg total) by mouth at bedtime as needed for anxiety. 30 tablet 0  . magnesium oxide (MAG-OX) 400 MG tablet Take 1 tablet (400 mg total) by mouth 2 (two) times daily. 60 tablet 3  . metoCLOPramide (REGLAN) 10 MG tablet Take 1 tablet (10 mg total) by mouth 2 (two) times daily. 60 tablet 3  . metolazone (ZAROXOLYN) 2.5 MG tablet Tomar una tableta (2.5 mg) Lunes y Jueves 60 tablet 3  . milrinone (PRIMACOR) 20 MG/100ML SOLN infusion Inject 33.95 mcg/min into the vein continuous. 100 mL 5  . polyethylene glycol (MIRALAX / GLYCOLAX) packet Take 17 g by mouth daily. 14 each 6  . potassium chloride SA (K-DUR,KLOR-CON) 20 MEQ tablet Take 2 tablets (40 mEq total) by mouth 2 (two) times daily. 60 tablet 3  . sildenafil (REVATIO) 20 MG tablet Take 1 tablet (20 mg total) by mouth 3 (three) times daily. 90 tablet 3  . sorbitol 70 % SOLN Take 30 mLs by mouth daily. 500 mL 3  . spironolactone (ALDACTONE) 25 MG tablet Take 1 tablet (25 mg total) by mouth 2 (two) times daily. 60 tablet 5  . torsemide (DEMADEX) 20 MG tablet Take 4 tablets (80 mg total) by mouth 2 (two) times daily. Replace Monday Wednesday and Friday morning demadex with 80mg  IV lasix. [facility] to manage 240 tablet 3  . warfarin (COUMADIN) 4 MG tablet Take 1 tablet (4 mg total) by mouth daily at 6 PM. Take 4 mg Monday, Wednesday, Friday and Sunday. 2 mg on Tues, Thurs and Sat (Patient taking differently: Take 2-4 mg by mouth daily at 6 PM. Take 4 mg Monday, Wednesday, Friday and Sunday. 2 mg on Tues, Thurs and Sat) 45 tablet 3   No current facility-administered medications for this encounter.    Filed Vitals:   10/23/14 1143  BP: 80/44  Pulse: 78  Resp: 20  Weight: 129 lb 8 oz (58.741 kg)  SpO2: 93%   PHYSICAL EXAM: General:  Elderly, NAD.No resp difficulty; wife and interpreter present (Graciela) Holding  cup of ice HEENT: normal Neck: supple. JVP 9; Carotids 2+ bilaterally; no bruits. No lymphadenopathy or thryomegaly appreciated.  Cor: PMI laterally displaced. Regular rhythm and rate. + s3 or murmur.  Lungs: clear Abdomen: soft, nontender, mildy distended. No hepatosplenomegaly. No bruits or masses. Good bowel sounds. Extremities: no cyanosis, clubbing, rash. 2+ edema .   Neuro: alert & orientedx3, cranial nerves grossly intact. Moves all 4 extremities w/o difficulty. Affect pleasant.   ASSESSMENT &  PLAN:  1. Chronic systolic CHF: NICM, EF 61% s/p Medtronic CRT-D; remains on milrinone 0.5 mcg/kg/min. Remains NYHA IIIb with multiple recent hospitalizations in setting of dietary and medication noncompliance. Volume status up.  He is not interested in palliative care or hospice at this time. He is a DNR.  - Mildly overloaded today in the setting of recent flutter - Continue lasix 80 IV M/W/F with [facility]. Got extra dose today. Continue zaroxolyn 2.5 on Monday and Friday as needed. Stressed need for fluid restriction. - Continue milrinone 0.5 mcg through PICC and will get weekly labs.   - Not on BB with history of low output and milrinone.  - He is not on ACE-I due to rash and no longer on ARB or spironolactone with renal function. - Continue current spironolactone to help with hypokalemia  - Discussed the importance of daily weights, a low sodium diet, and fluid restriction (less than 2 L a day). Instructed to call the HF clinic if weight increases more than 3 lbs overnight or 5 lbs in a week.  2. Paroxysmal atrial fibrillation/flutter: Cardioverted back to NSR 12/27/13. Has recurrent AFL over past 2 days. Now back in NSR. Increase amio to 200 bid for now.  3. Pulmonary arterial hypertension:  Continue revatio 20 mg TID 40 mg and milrinone.  4. CKD stage III: Stable. Continue to follow.  5. Abdominal discomfort/distension: This persists. Will start reglan 10 mg bid. Get gastric emptying study and  refer to GI.  6. Hypokalemia: Now he is apparently taking his potassium (was throwing away before) K up. Recheck BMET next week.  ICD interrogated personally and discussed with him and his family through interpreter.  Total time spent 40 minutes (not including ICD interrogation). Over half that time spent discussing above.   Bensimhon, Daniel,MD 10/23/2014

## 2014-10-25 ENCOUNTER — Other Ambulatory Visit (HOSPITAL_COMMUNITY): Payer: Self-pay

## 2014-10-25 ENCOUNTER — Inpatient Hospital Stay (HOSPITAL_COMMUNITY)
Admission: EM | Admit: 2014-10-25 | Discharge: 2014-10-30 | DRG: 292 | Disposition: A | Discharge status: Home / Self Care | Payer: Medicare Other | Attending: Internal Medicine | Admitting: Internal Medicine

## 2014-10-25 ENCOUNTER — Emergency Department (HOSPITAL_COMMUNITY): Payer: Medicare Other

## 2014-10-25 ENCOUNTER — Encounter (HOSPITAL_COMMUNITY): Payer: Self-pay | Admitting: Emergency Medicine

## 2014-10-25 DIAGNOSIS — I129 Hypertensive chronic kidney disease with stage 1 through stage 4 chronic kidney disease, or unspecified chronic kidney disease: Secondary | ICD-10-CM | POA: Diagnosis present

## 2014-10-25 DIAGNOSIS — I480 Paroxysmal atrial fibrillation: Secondary | ICD-10-CM | POA: Diagnosis present

## 2014-10-25 DIAGNOSIS — I5022 Chronic systolic (congestive) heart failure: Secondary | ICD-10-CM | POA: Diagnosis not present

## 2014-10-25 DIAGNOSIS — E876 Hypokalemia: Secondary | ICD-10-CM | POA: Diagnosis present

## 2014-10-25 DIAGNOSIS — I252 Old myocardial infarction: Secondary | ICD-10-CM | POA: Diagnosis not present

## 2014-10-25 DIAGNOSIS — I2721 Secondary pulmonary arterial hypertension: Secondary | ICD-10-CM | POA: Diagnosis present

## 2014-10-25 DIAGNOSIS — N179 Acute kidney failure, unspecified: Secondary | ICD-10-CM | POA: Diagnosis present

## 2014-10-25 DIAGNOSIS — Z66 Do not resuscitate: Secondary | ICD-10-CM | POA: Diagnosis present

## 2014-10-25 DIAGNOSIS — I272 Other secondary pulmonary hypertension: Secondary | ICD-10-CM | POA: Diagnosis present

## 2014-10-25 DIAGNOSIS — N183 Chronic kidney disease, stage 3 (moderate): Secondary | ICD-10-CM | POA: Diagnosis not present

## 2014-10-25 DIAGNOSIS — I5043 Acute on chronic combined systolic (congestive) and diastolic (congestive) heart failure: Secondary | ICD-10-CM | POA: Diagnosis present

## 2014-10-25 DIAGNOSIS — I4892 Unspecified atrial flutter: Secondary | ICD-10-CM | POA: Diagnosis present

## 2014-10-25 DIAGNOSIS — R21 Rash and other nonspecific skin eruption: Secondary | ICD-10-CM | POA: Diagnosis present

## 2014-10-25 DIAGNOSIS — E785 Hyperlipidemia, unspecified: Secondary | ICD-10-CM | POA: Diagnosis present

## 2014-10-25 DIAGNOSIS — G40909 Epilepsy, unspecified, not intractable, without status epilepticus: Secondary | ICD-10-CM | POA: Diagnosis present

## 2014-10-25 DIAGNOSIS — Z8673 Personal history of transient ischemic attack (TIA), and cerebral infarction without residual deficits: Secondary | ICD-10-CM | POA: Diagnosis not present

## 2014-10-25 DIAGNOSIS — I469 Cardiac arrest, cause unspecified: Secondary | ICD-10-CM | POA: Diagnosis not present

## 2014-10-25 DIAGNOSIS — I447 Left bundle-branch block, unspecified: Secondary | ICD-10-CM | POA: Diagnosis present

## 2014-10-25 DIAGNOSIS — I2510 Atherosclerotic heart disease of native coronary artery without angina pectoris: Secondary | ICD-10-CM | POA: Diagnosis present

## 2014-10-25 DIAGNOSIS — I5042 Chronic combined systolic (congestive) and diastolic (congestive) heart failure: Secondary | ICD-10-CM

## 2014-10-25 DIAGNOSIS — I5023 Acute on chronic systolic (congestive) heart failure: Secondary | ICD-10-CM | POA: Diagnosis present

## 2014-10-25 DIAGNOSIS — R079 Chest pain, unspecified: Secondary | ICD-10-CM | POA: Diagnosis present

## 2014-10-25 DIAGNOSIS — N189 Chronic kidney disease, unspecified: Secondary | ICD-10-CM | POA: Diagnosis not present

## 2014-10-25 DIAGNOSIS — Z888 Allergy status to other drugs, medicaments and biological substances status: Secondary | ICD-10-CM | POA: Diagnosis not present

## 2014-10-25 DIAGNOSIS — Z7901 Long term (current) use of anticoagulants: Secondary | ICD-10-CM | POA: Diagnosis not present

## 2014-10-25 DIAGNOSIS — E780 Pure hypercholesterolemia: Secondary | ICD-10-CM | POA: Diagnosis present

## 2014-10-25 LAB — BASIC METABOLIC PANEL
Anion gap: 15 (ref 5–15)
BUN: 48 mg/dL — AB (ref 6–20)
CALCIUM: 9.1 mg/dL (ref 8.9–10.3)
CO2: 24 mmol/L (ref 22–32)
Chloride: 88 mmol/L — ABNORMAL LOW (ref 101–111)
Creatinine, Ser: 2.38 mg/dL — ABNORMAL HIGH (ref 0.61–1.24)
GFR calc Af Amer: 28 mL/min — ABNORMAL LOW (ref >60)
GFR, EST NON AFRICAN AMERICAN: 24 mL/min — AB (ref >60)
GLUCOSE: 197 mg/dL — AB (ref 70–99)
Potassium: 5.2 mmol/L — ABNORMAL HIGH (ref 3.5–5.1)
Sodium: 127 mmol/L — ABNORMAL LOW (ref 135–145)

## 2014-10-25 LAB — CBC
HEMATOCRIT: 37.4 % — AB (ref 39.0–52.0)
Hemoglobin: 12.3 g/dL — ABNORMAL LOW (ref 13.0–17.0)
MCH: 25.3 pg — AB (ref 26.0–34.0)
MCHC: 32.9 g/dL (ref 30.0–36.0)
MCV: 77 fL — AB (ref 78.0–100.0)
PLATELETS: 321 10*3/uL (ref 150–400)
RBC: 4.86 MIL/uL (ref 4.22–5.81)
RDW: 18.3 % — ABNORMAL HIGH (ref 11.5–15.5)
WBC: 11.7 10*3/uL — ABNORMAL HIGH (ref 4.0–10.5)

## 2014-10-25 LAB — I-STAT TROPONIN, ED: TROPONIN I, POC: 0.03 ng/mL (ref 0.00–0.08)

## 2014-10-25 LAB — BRAIN NATRIURETIC PEPTIDE: B Natriuretic Peptide: 1882.5 pg/mL — ABNORMAL HIGH (ref 0.0–100.0)

## 2014-10-25 LAB — MRSA PCR SCREENING: MRSA by PCR: NEGATIVE

## 2014-10-25 LAB — PROTIME-INR
INR: 2.29 — AB (ref 0.00–1.49)
PROTHROMBIN TIME: 25.4 s — AB (ref 11.6–15.2)

## 2014-10-25 MED ORDER — MAGNESIUM OXIDE 400 (241.3 MG) MG PO TABS
400.0000 mg | ORAL_TABLET | Freq: Two times a day (BID) | ORAL | Status: DC
  Administered 2014-10-25 – 2014-10-30 (×10): 400 mg via ORAL
  Filled 2014-10-25 (×12): qty 1

## 2014-10-25 MED ORDER — AMIODARONE HCL 200 MG PO TABS
200.0000 mg | ORAL_TABLET | Freq: Two times a day (BID) | ORAL | Status: DC
  Administered 2014-10-25 – 2014-10-30 (×10): 200 mg via ORAL
  Filled 2014-10-25 (×12): qty 1

## 2014-10-25 MED ORDER — MAGNESIUM OXIDE 400 MG PO TABS: 400.0000 mg | ORAL_TABLET | Freq: Two times a day (BID) | ORAL | Status: DC

## 2014-10-25 MED ORDER — SPIRONOLACTONE 25 MG PO TABS
25.0000 mg | ORAL_TABLET | Freq: Two times a day (BID) | ORAL | Status: DC
  Filled 2014-10-25 (×3): qty 1

## 2014-10-25 MED ORDER — POTASSIUM CHLORIDE CRYS ER 20 MEQ PO TBCR
40.0000 meq | EXTENDED_RELEASE_TABLET | Freq: Two times a day (BID) | ORAL | Status: DC
  Administered 2014-10-25 – 2014-10-26 (×3): 40 meq via ORAL
  Filled 2014-10-25 (×5): qty 2

## 2014-10-25 MED ORDER — METOCLOPRAMIDE HCL 10 MG PO TABS
10.0000 mg | ORAL_TABLET | Freq: Two times a day (BID) | ORAL | Status: DC
  Administered 2014-10-25 – 2014-10-30 (×10): 10 mg via ORAL
  Filled 2014-10-25 (×12): qty 1

## 2014-10-25 MED ORDER — SODIUM CHLORIDE 0.9 % IJ SOLN: 3.0000 mL | INTRAMUSCULAR | Status: DC | PRN

## 2014-10-25 MED ORDER — ONDANSETRON HCL 4 MG/2ML IJ SOLN
4.0000 mg | Freq: Four times a day (QID) | INTRAMUSCULAR | Status: DC | PRN
  Administered 2014-10-25: 4 mg via INTRAVENOUS
  Filled 2014-10-25: qty 2

## 2014-10-25 MED ORDER — SILDENAFIL CITRATE 20 MG PO TABS
20.0000 mg | ORAL_TABLET | Freq: Three times a day (TID) | ORAL | Status: DC
  Administered 2014-10-25 – 2014-10-30 (×16): 20 mg via ORAL
  Filled 2014-10-25 (×18): qty 1

## 2014-10-25 MED ORDER — TORSEMIDE 20 MG PO TABS
80.0000 mg | ORAL_TABLET | Freq: Two times a day (BID) | ORAL | Status: DC
  Administered 2014-10-25 – 2014-10-27 (×4): 80 mg via ORAL
  Filled 2014-10-25 (×6): qty 4

## 2014-10-25 MED ORDER — POLYETHYLENE GLYCOL 3350 17 G PO PACK
17.0000 g | PACK | Freq: Every day | ORAL | Status: DC
  Administered 2014-10-26 – 2014-10-30 (×5): 17 g via ORAL
  Filled 2014-10-25 (×5): qty 1

## 2014-10-25 MED ORDER — LORAZEPAM 1 MG PO TABS: 1.0000 mg | ORAL_TABLET | Freq: Every evening | ORAL | Status: DC | PRN

## 2014-10-25 MED ORDER — SORBITOL 70 % SOLN
30.0000 mL | Freq: Every day | Status: DC
  Administered 2014-10-25 – 2014-10-30 (×6): 30 mL via ORAL
  Filled 2014-10-25 (×6): qty 30

## 2014-10-25 MED ORDER — MILRINONE IN DEXTROSE 20 MG/100ML IV SOLN
0.5000 ug/kg/min | INTRAVENOUS | Status: DC
  Administered 2014-10-25 – 2014-10-30 (×11): 0.5 ug/kg/min via INTRAVENOUS
  Filled 2014-10-25 (×11): qty 100

## 2014-10-25 MED ORDER — WARFARIN SODIUM 2 MG PO TABS
2.0000 mg | ORAL_TABLET | Freq: Once | ORAL | Status: AC
  Administered 2014-10-25: 2 mg via ORAL
  Filled 2014-10-25: qty 1

## 2014-10-25 MED ORDER — SODIUM CHLORIDE 0.9 % IV SOLN: 250.0000 mL | INTRAVENOUS | Status: DC | PRN

## 2014-10-25 MED ORDER — ACETAMINOPHEN 325 MG PO TABS: 650.0000 mg | ORAL_TABLET | Freq: Four times a day (QID) | ORAL | Status: DC | PRN

## 2014-10-25 MED ORDER — SODIUM CHLORIDE 0.9 % IJ SOLN
3.0000 mL | Freq: Two times a day (BID) | INTRAMUSCULAR | Status: DC
  Administered 2014-10-25 – 2014-10-26 (×3): 3 mL via INTRAVENOUS
  Administered 2014-10-27: 20 mL via INTRAVENOUS
  Administered 2014-10-27: 3 mL via INTRAVENOUS
  Administered 2014-10-28: 20 mL via INTRAVENOUS
  Administered 2014-10-29: 3 mL via INTRAVENOUS

## 2014-10-25 MED ORDER — ACETAMINOPHEN 325 MG PO TABS: 650.0000 mg | ORAL_TABLET | ORAL | Status: DC | PRN

## 2014-10-25 MED ORDER — WARFARIN - PHARMACIST DOSING INPATIENT
Freq: Every day | Status: DC
Start: 2014-10-26 — End: 2014-10-30
  Administered 2014-10-27 – 2014-10-30 (×4)

## 2014-10-25 MED ORDER — ENSURE ENLIVE PO LIQD
237.0000 mL | Freq: Two times a day (BID) | ORAL | Status: DC
  Administered 2014-10-26 – 2014-10-29 (×8): 237 mL via ORAL

## 2014-10-25 NOTE — Discharge Instructions (Signed)
Please have home health skip the lasix dose on Monday and recheck Creatinine.   Por favor, haga que su enfermera [name] omitir su dosis Lasix el lunes y dibujar un nivel de creatinina .  Follow up with your cardiology group.   Insuficiencia cardaca (Heart Failure) La insuficiencia cardaca es una afeccin en la que el corazn tiene dificultad para bombear la [place]. El corazn no bombea sangre de [place] eficiente para que el organismo pueda funcionar bien. En algunos casos de insuficiencia cardaca, los lquidos vuelven a los pulmones, o puede haber hinchazn (edema) en la zona inferior de las piernas. La insuficiencia cardaca por lo general es una enfermedad de larga duracin (crnica). Es importante que se cuide mucho y que siga el plan de tratamiento que le indique su mdico. CAUSAS  Algunas enfermedades y condiciones pueden causar insuficiencia cardaca. Estas incluyen lo siguiente:  Presin arterial elevada (hipertensin). La hipertensin hace que el msculo cardaco trabaje ms de lo normal. Cuando la presin en los vasos sanguneos es alta, el corazn tiene que bombear (contraerse) con ms fuerza para Armed forces technical officer la sangre por todo el cuerpo. La hipertensin arterial hace que con el tiempo el corazn se vuelva rgido y dbil.  Enfermedad arterial coronaria (EAC). La enfermedad arterial coronaria es la acumulacin de colesterol y grasa (placa) en las arterias del corazn. La obstruccin de las arterias priva al msculo cardaco de sangre y oxgeno. Esto ocasiona dolor en el pecho y puede conducir a un infarto. La hipertensin arterial tambin favorece la enfermedad arterial coronaria.  Ataque cardaco (infarto de miocardio). El ataque cardaco se produce cuando se obstruye una o ms arterias del corazn. La prdida de oxgeno daa el tejido muscular cardaco. Cuando esto ocurre, una parte del msculo cardaco muere. El tejido daado no se contrae bien y Public house manager la capacidad del corazn  para Chiropodist.  Vlvulas cardacas anormales. Cuando las vlvulas cardacas no se abren y cierran como corresponde, pueden causar insuficiencia cardaca. Esto hace que el msculo cardaco tenga que bombear con ms intensidad para que la [place].  Enfermedad del msculo cardaco (miocardiopata o miocarditis). En esta enfermedad, el msculo cardaco est daado por diversas causas. Entre ellas se encuentran el consumo de alcohol o drogas, las infecciones o pueden ser causas desconocidas. Todas estas causas aumentan el riesgo de insuficiencia cardaca.  Enfermedad pulmonar. Enfermedad pulmonar que hace que el corazn se esfuerce ms porque los pulmones no funcionan correctamente. Esto puede hacer que el corazn se tensione, y causar insuficiencia.  Diabetes. La diabetes aumenta el riesgo de insuficiencia cardaca. El nivel elevado de azcar en la sangre contribuye a aumentar los [place] de grasas (lpidos) en la [place]. La diabetes tambin favorece que lentamente se daen los pequeos vasos sanguneos que transportan nutrientes importantes al el msculo cardaco. Cuando el corazn no obtiene oxgeno y alimento suficiente, se debilita y se torna rgido. Por lo tanto no se contrae de Set designer.  Hay otras enfermedades y condiciones que pueden contribuir a la insuficiencia cardaca. Entre ellas se incluyen el ritmo cardaco anormal, los problemas de tiroides y los recuentos bajos de glbulos rojos (anemia). Determinadas conductas perjudiciales aumentan el riesgo de insuficiencia cardaca, por ejemplo:  Tener sobrepeso.  Fumar o mascar tabaco.  Consumir alimentos ricos en grasas y colesterol.  Abusar de las drogas ilcitas o del alcohol.  La falta de actividad fsica. SNTOMAS  Los sntomas pueden variar y ser difciles de Hydrographic surveyor. Los sntomas pueden incluir:  Falta de aire al Data processing manager  como subir escaleras.  Tos persistente.  Hinchazn de los pies, tobillos,  piernas o abdomen.  Aumento de peso sin motivo.  Dificultad para respirar al estar acostado (ortopnea).  Despertarse con la necesidad de sentarse y tomar aire.  Latidos cardacos rpidos.  Fatiga y prdida de Teacher, early years/pre.  Mareos, o sensacin de desmayo o desvanecimiento.  Prdida del apetito.  Nuseas.  Orina con ms frecuencia durante la noche (nocturia). DIAGNSTICO  El diagnstico se realiza segn la historia clnica, los sntomas, el examen fsico y las pruebas diagnsticas. Las pruebas diagnsticas son:  Lawyer.  Electrocardiograma.  Radiografa de trax.  Anlisis de [place].  Prueba de esfuerzo.  Angiografa cardaca.  Gammagrafa. TRATAMIENTO  El tratamiento est destinado al control de los sntomas. Podr ser necesario que tome medicamentos, realice cambios en su estilo de vida o se someta a una intervencin [place] para tratar la insuficiencia cardaca.  Los medicamentos para el tratamiento de la insuficiencia cardaca son:  Inhibidores de la enzima convertidora de angiotensina (IECA). Este tipo de [employer] bloquea los efectos de una protena llamada enzima convertidora de angiotensina. Los inhibidores de la ECA Engineer, agricultural (dilatan) los vasos sanguneos y ayudan a Equities trader la presin arterial.  Bloqueadores de los receptores de angiotensina [place]). Este tipo de medicamento bloquea las acciones de una protena de la sangre llamada angiotensina. Los bloqueadores de los receptores de angiotensina dilatan los vasos sanguneos y [place] a reducir la presin arterial.  Medicamentos para eliminar los lquidos (diurticos). Los diurticos [employer] los riones eliminen la sal y el agua de la [place]. El lquido en exceso es eliminado con la [place]. Esta eliminacin del exceso de lquido disminuye el volumen de sangre que el corazn bombea.  Betabloqueantes. Los betabloqueantes evitan que el corazn palpite demasiado rpido y mejoran la fuerza del msculo  cardaco.  Digitlicos. Aumentan la fuerza de los latidos cardacos.  Los [employer] un estilo de vida saludable incluyen:  Science writer y Theatre manager un peso saludable.  Dejar de fumar o mascar tabaco.  Consumir alimentos cardiosaludables.  Limitar o evitar el alcohol.  Dejar de consumir drogas ilcitas.  Hacer actividad fsica segn las indicaciones de su mdico.  Los tratamientos quirrgicos para la insuficiencia cardaca pueden ser:  Un procedimiento para abrir las arterias obstruidas, reparacin de vlvulas cardacas daadas o la extirpacin del tejido muscular cardaco daado.  La colocacin de un marcapasos para ayudar al funcionamiento del msculo cardaco y para controlar ciertos ritmos cardacos anormales.  Un desfibrilador cardioversor interno para tratar determinados ritmos cardacos graves anormales.  Un dispositivo de asistencia ventricular izquierda (DAVI) para ayudar a que el corazn [employer]. [place] los medicamentos solamente como se lo haya indicado el mdico. Los medicamentos son importantes para reducir la carga de trabajo del corazn, disminuir la progresin de la insuficiencia cardaca y Teacher, English as a foreign language los sntomas.  No deje de tomar los medicamentos, excepto si se lo indica su mdico.  No se saltee ninguna dosis de los medicamentos.  Pida una nueva receta antes de quedarse sin medicamentos. Es necesario que tome sus medicamentos todos [place].  Haga actividad fsica moderada si se lo indica su mdico. La actividad fsica moderada puede beneficiar a Psychologist, clinical. Los ancianos y las personas con insuficiencia cardaca grave deben consultarle a su mdico qu actividades fsicas son recomendables para ellos.  Consuma alimentos cardiosaludables. Los alimentos no deben incluir grasas trans y deben tener bajo contenido de grasas saturadas, colesterol y sal (sodio). Las opciones  saludables incluyen frutas frescas o  congeladas y verduras, pescado, carnes magras, legumbres, productos lcteos descremados o bajos en grasa y cereales integrales o alimentos con alto contenido de fibra. Hable con un nutricionista para aprender ms sobre los alimentos cardiosaludables.  Limite el sodio si se lo indica su mdico. La restriccin de sodio puede reducir los sntomas de insuficiencia cardaca en [employer]. Hable con un nutricionista para aprender ms sobre los condimentos cardiosaludables.  Use mtodos de coccin saludables. Los mtodos de coccin saludables incluyen asar, Interior and spatial designer, hervir, Teacher, music, cocer al vapor o IT consultant. Hable con un nutricionista para aprender ms acerca de los mtodos de coccin saludables.  Limite los lquidos si se lo indica su mdico. La restriccin de lquidos puede reducir los sntomas de insuficiencia cardaca en [employer].  Controle su peso a diario. Es importante que se pese todos los das para reconocer anticipadamente cuando hay exceso de lquido. Hgalo todas las maanas despus de orinar y antes de Merchandiser, retail. Use la misma ropa cada vez que se pese. Anote su [employer]. Lleve a su mdico el registro de su peso.  Controle y registre su presin arterial si se lo indica su mdico.  Contrlese el pulso si se lo indica su mdico.  Pierda peso si se lo indica su mdico. La prdida de peso puede reducir los sntomas de insuficiencia cardaca en Psychologist, clinical.  Deje de fumar o mascar tabaco. La nicotina hace que el corazn trabaje ms y [employer] vasos sanguneos. No utilice chicles ni parches de nicotina antes de hablar con su mdico.  Concurra a todas las visitas de control como se lo haya indicado el mdico. Esto es importante.  Limite el consumo de alcohol a no ms de 1 medida por da en las mujeres no embarazadas y 2 medidas en los hombres. Una medida equivale a 12onzas de cerveza, 5onzas de vino o 1onzas de bebidas alcohlicas de alta graduacin. [place] ms  puede ser daino para el corazn. Informe a su mdico si bebe alcohol varias veces a la semana. Hable con su mdico acerca de si el alcohol es seguro para usted. Si el corazn ya ha sufrido un dao por el alcohol o sufre una insuficiencia cardaca grave, debe dejar de consumirlo completamente.  Deje de consumir drogas ilcitas.  [place]. Esto es especialmente importante prevenir las infecciones respiratorias con vacunas actualizadas contra el neumococo y la gripe.  Controle otros problemas de [place], como hipertensin, diabetes, enfermedad de la tiroides o ritmos cardacos anormales segn las indicaciones de su mdico.  Aprenda a Engineer, maintenance (IT).  Planifique perodos de descanso cuando se sienta fatigado.  Aprenda estrategias para manejar las altas temperaturas. Si el clima es extremadamente caluroso:  Evite la actividad fsica intensa.  Use el aire acondicionado o los ventiladores o busque un lugar ms fresco.  Evite la cafena y el alcohol.  Use ropa holgada, ligera y de colores claros.  Aprenda estrategias para manejar el fro. Si el clima es extremadamente fro:  Evite la actividad fsica intensa.  Vstase con varias capas de ropa.  Use mitones o guantes, un sombrero y [place] bufanda cuando salga.  Evite el alcohol.  Reciba asesoramiento y 18 si lo necesita.  Busque programas de rehabilitacin y participe en ellos para mantener o mejorar su independencia y su calidad de vida. SOLICITE ATENCIN MDICA SI:   Aumenta de peso, 3 libras (1,4 kg) en un da o 5 libras (2,3 Kg) en una semana.  Nota que le falta el aire y esto no es habitual en usted.  No puede participar en sus actividades fsicas habituales.  Se cansa con facilidad.  Tose ms de lo normal, especialmente al realizar actividad fsica.  Observa que sus manos, pies, tobillos o abdomen se le hinchan o estn ms hinchados que lo habitual.  Le cuesta dormir debido a que Musician.  Siente que el corazn late rpido (palpitaciones).  Siente mareos o sensacin de desvanecimiento al ponerse de pie. SOLICITE ATENCIN MDICA DE INMEDIATO SI:   Tiene dificultad para respirar.  Hay una modificacin en el estado mental, como disminucin de la lucidez o dificultad para concentrarse.  Siente dolor o Adult nurse.  Se desmaya una vez (sncope). ASEGRESE DE QUE:   Comprende estas instrucciones.  Controlar su afeccin.  Recibir ayuda de inmediato si no mejora o si empeora. Document Released: 06/06/2005 Document Revised: 10/21/2013 [facility] Patient Information 2015 [place], [place]. This information is not intended to replace advice given to you by your health care provider. Make sure you discuss any questions you have with your health care provider.

## 2014-10-25 NOTE — Progress Notes (Addendum)
ANTICOAGULATION CONSULT NOTE - Initial Consult  Pharmacy Consult for warfarin Indication: atrial fibrillation  Allergies  Allergen Reactions  . Lisinopril Rash    ( pt denies this allergy)    Patient Measurements: Weight: 125 lb 3.2 oz (56.79 kg) Heparin Dosing Weight:   Vital Signs: Temp: 97.6 F (36.4 C) (05/07 0405) Temp Source: Oral (05/07 0405) BP: 95/55 mmHg (05/07 1227) Pulse Rate: 81 (05/07 1227)  Labs:  Recent Labs  10/25/14 0409  HGB 12.3*  HCT 37.4*  PLT 321  CREATININE 2.38*    Estimated Creatinine Clearance: 20.2 mL/min (by C-G formula based on Cr of 2.38).   Medical History: Past Medical History  Diagnosis Date  . HTN (hypertension)   . High cholesterol   . CHF (congestive heart failure)   . BBBB (bilateral bundle branch block)   . LBBB (left bundle branch block)   . Seizures 06/11/2012    new onset/notes (06/11/2012)  . SOB (shortness of breath)     "sometimes when I lay down;; related to not taking my RX" (06/11/2012)  . Myocardial infarction     06/10/2012  . Atrial fibrillation   . Stroke   . Atrial thrombus     left  . Pulmonary HTN   . Atrial fibrillation 06/12/2012  . Coronary artery disease     Assessment: 81 year old male with end stage heart failure well known to pharmacy from HF Service. Patient is on chronic warfarin for afib.  Warfarin (4 mg) on Monday,(2 mg) on Sunday, Tuesday, Wednesday, Thursday, Friday, Saturday (per anti-coag visit 10/20/14)  INR at goal 2.2. Will Continue with home dosing of 2mg  tonight. No bleeding issues noted on arrival.  Goal of Therapy:  INR 2-3 Monitor platelets by anticoagulation protocol: Yes   Plan:  Warfarin 2mg  tonight Daily INR  Erin Hearing PharmD., BCPS Clinical Pharmacist Pager [phone] 10/25/2014 3:25 PM

## 2014-10-25 NOTE — ED Notes (Addendum)
Report from [facility]> family called EMS for pt c/o resp distress.  On EMS arrival, pt anxious.  After O2 via [place] pt's anxiety seemed to get better.  Needs translator.  On arrival to ED, pt's breathing is unlabored.

## 2014-10-25 NOTE — H&P (Signed)
Admit date: 10/25/2014 Primary Physician  Harvie Junior, MD Primary Cardiologist  Dr. Haroldine Laws  CC: Don't feel well  HPI: From Dr. Clayborne Dana recent clinic note 10/23/14: Tyler Christensen is a 81 y.o. [name] (non-english speaking) w/ PMHx significant for chronic systolic CHF (EF 70%), NICM, LBBB, PAF (on coumadin), HTN, HLD, CVA, Pulmonary Hypertension and h/o seizure d/o (2/2 CVA 05/2012).   Admitted to [facility] 06/2012 with acute decompensated heart failure EF 20%. As noted below, also diagnosed with severe pulmonary hypertension (PAPs ~ 90) from initial RHC and placed on Milrinone and Revatio (eventually transitioned to tadalafil 40) with marked clinical response and decrease in PA pressures. VQ low probability for pulmonary embolus. Discharged on Milrinone at 0.375 mcg via PICC. Discharge Weight 146 pounds. Attempted to wean milrinone in 9/14 but failed due to worsening HF and PAH. Milrinone restarted.  Admitted to the hospital 5/21-11/22/13. Found to have staph aureus bacteremia treated with IV abx. TEE negative for endocarditis. Also found to retroperitoneal mass and biopsy revealed benign lesion. He had evidence of low output thought to be due to atrial fibrillation and milrinone increased. Attempted TEE and DC-CV but had LAA clot so deferred cardioversion.  He was admitted in 12/15 with acute on chronic systolic CHF. He was diuresed with IV Lasix and milrinone was increased to 0.5 mcg/kg/min.  Over past 2 months has had numerous admits for HF. At last visit, we finally convinced him to go [facility] but he left within a week. Now back home.  Remains on milrinone 0.5 mcg/kg/min. Scheduled to get IV lasix M/W/F with [facility]. Occassionaly lasix held due to [name] < 80. Got lasix today because more SOB and had some swelling. Weight 129 on 10/25/14. Denies drinking water or ice but wife says he is drinking a lot. Taking medications. Refuses Hospice.   ICD interrogation: No VT, has some AFlutter over  past few days. (1.7%). 18 hour event yesterday. Now back in NSR. Fluid index over threshold. Activity level 0. BiV pacing dropped to 95%  NO ACE-I 2/2 rash  He came into the emergency room yesterday evening with implants of just not feeling well. Originally was discussed with cardiologist on call who felt that it was reasonable for him to go home from emergency room. Tyler Christensen however did not feel comfortable with this. His wife is not at home currently. When asked if he would like to go home, he states that he does not feel well. Occasionally will have chest pain. He would like to stay in the hospital.  Creatinine here in the emergency room was elevated at 2.38 up from 1.38 in the past. His creatinine has been as high as the 2.4 range back in July 2015. History was gathered with phone interpreter.  08/14/12 S/P successful DC-CV of AF 10/26/12 S/P BiV-ICD implant 01/07/13 ECHO EF 20%, restrictive diastolic function, mildly decreased RV size and systolic fxn, mild to moderate MR, PA systolic pressure 59 mmHg  02/21/13 ECHO EF 20% PA pressure mean 59 11/12/13: ECHO EF 20-25%, trivial AI,  07/07/14: ECHO EF 15% moderate RV dysfunction PAP 85  Labs   (06/25/13) K 4.2 Creatinine 1.58 Magnesium 2.0   (07/16/13) K 4.2, Cr 1.67  (2/15) creatinine 1.97 => 1.94, K 4.6, HCT 39.4  (4/15) K 4.5, creatinine 1.56, pro-BNP 6912  (4/15) K 3.9, creatinine 2.39, mag 2.1   (09/2013) K 3.7, creatinine 2.03  11/26/13 K 4.6 cr 2.8 hgb 13.2  12/11/13 K 3.4 Creatinine 2.14 Magnesium 2.1  12/19/13: K+ 3.4, creatinine 2.42, BUN 57  01/14/14: K 3.8, creatinine 2.45, BUN 45, mag 2.1  12/15 K 3.6, creatinine 1.68, HCT 34.4  07/17/14: K 2.4 creatinine 1.51  08/22/14: K 3.4 creatinine 1.46  10/07/14: K 5.6 creatinine 1.51 Na 124     PMH:   Past Medical History  Diagnosis Date  . HTN (hypertension)   . High cholesterol   . CHF (congestive heart failure)   . BBBB (bilateral bundle  branch block)   . LBBB (left bundle branch block)   . Seizures 06/11/2012    new onset/notes (06/11/2012)  . SOB (shortness of breath)     "sometimes when I lay down;; related to not taking my RX" (06/11/2012)  . Myocardial infarction     06/10/2012  . Atrial fibrillation   . Stroke   . Atrial thrombus     left  . Pulmonary HTN   . Atrial fibrillation 06/12/2012  . Coronary artery disease     PSH:   Past Surgical History  Procedure Laterality Date  . Throat surgery  1942    "and nose" (06/11/2012); ?T&A  . Inguinal hernia repair  ~ 2008    "both sides" (06/11/2012)  . Tee without cardioversion  06/29/2012    Procedure: TRANSESOPHAGEAL ECHOCARDIOGRAM (TEE);  Surgeon: Jolaine Artist, MD;  Location: [facility] ENDOSCOPY;   . Cardioversion N/A 08/14/2012    Procedure: CARDIOVERSION;  Surgeon: Jolaine Artist, MD;  Location: [facility] ENDOSCOPY;  . Tee without cardioversion N/A 11/12/2013    Procedure: TRANSESOPHAGEAL ECHOCARDIOGRAM (TEE);  Surgeon: Candee Furbish, MD;  Location: [facility] ENDOSCOPY;   . Tee without cardioversion N/A 11/19/2013    Procedure: TRANSESOPHAGEAL ECHOCARDIOGRAM (TEE);  Surgeon: Jolaine Artist, MD;  Location: [facility] ENDOSCOPY;   . Cardioversion N/A 12/27/2013    Procedure: CARDIOVERSION;  Surgeon: Jolaine Artist, MD;  Location: [facility] ENDOSCOPY;   . Left and right heart catheterization with coronary angiogram N/A 07/02/2012    Procedure: LEFT AND RIGHT HEART CATHETERIZATION WITH CORONARY ANGIOGRAM;  Surgeon: Jolaine Artist, MD;  Location: [place] CATH LAB; Mild non-obstructive CAD, severe PAH, PA = 93/42 (59), severe NICM w/ EF 15%  . Right heart catheterization N/A 07/06/2012    Procedure: RIGHT HEART CATH;  Surgeon: Jolaine Artist, MD; to assess response to milrinone; Moderate PH with profound reduction in PVR with milrinone/PDE-5 inhibitor   . Bi-ventricular implantable cardioverter defibrillator N/A 10/26/2012    Procedure: BI-VENTRICULAR IMPLANTABLE CARDIOVERTER DEFIBRILLATOR   (CRT-D);  Surgeon: Evans Lance, MD;  Medtronic Evera XT CRTD, BiV ICD serial #[id number]     Allergies:  Lisinopril Prior to Admit Meds:   Prior to Admission medications   Medication Sig Start Date End Date Taking? Authorizing Provider  acetaminophen (TYLENOL) 325 MG tablet Take 2 tablets (650 mg total) by mouth every 6 (six) hours as needed for mild pain (or Fever >/= 101). 07/10/14   Kinnie Feil, MD  amiodarone (PACERONE) 200 MG tablet Take 1 tablet (200 mg total) by mouth 2 (two) times daily. 10/23/14   Jolaine Artist, MD  LORazepam (ATIVAN) 1 MG tablet Take 1 tablet (1 mg total) by mouth at bedtime as needed for anxiety. 09/29/14   [name] D Clegg, NP  magnesium oxide (MAG-OX) 400 MG tablet Take 1 tablet (400 mg total) by mouth 2 (two) times daily. 04/25/14   Jolaine Artist, MD  metoCLOPramide (REGLAN) 10 MG tablet Take 1 tablet (10 mg total) by mouth 2 (two) times daily. 07/31/14  Jolaine Artist, MD  metolazone (ZAROXOLYN) 2.5 MG tablet Tomar una tableta (2.5 mg) Lunes y Rudi Coco 10/09/14   Jolaine Artist, MD  milrinone [facility]) 20 MG/100ML SOLN infusion Inject 33.95 mcg/min into the vein continuous. 09/18/14   Almyra Deforest, PA  polyethylene glycol (MIRALAX / GLYCOLAX) packet Take 17 g by mouth daily. 08/23/14   Isaiah Serge, NP  potassium chloride SA (K-DUR,KLOR-CON) 20 MEQ tablet Take 2 tablets (40 mEq total) by mouth 2 (two) times daily. 10/09/14   Jolaine Artist, MD  sildenafil (REVATIO) 20 MG tablet Take 1 tablet (20 mg total) by mouth 3 (three) times daily. 09/18/14   Almyra Deforest, PA  sorbitol 70 % SOLN Take 30 mLs by mouth daily. 09/18/14   Almyra Deforest, PA  spironolactone (ALDACTONE) 25 MG tablet Take 1 tablet (25 mg total) by mouth 2 (two) times daily. 09/18/14   Almyra Deforest, PA  torsemide (DEMADEX) 20 MG tablet Take 4 tablets (80 mg total) by mouth 2 (two) times daily. Replace Monday Wednesday and Friday morning demadex with 80mg  IV lasix. [facility] to manage 09/18/14   Almyra Deforest, PA    warfarin (COUMADIN) 4 MG tablet Take 1 tablet (4 mg total) by mouth daily at 6 PM. Take 4 mg Monday, Wednesday, Friday and Sunday. 2 mg on Tues, Thurs and Sat Patient taking differently: Take 2-4 mg by mouth daily at 6 PM. Take 4 mg Monday, Wednesday, Friday and Sunday. 2 mg on Tues, Thurs and Sat 09/18/14   Almyra Deforest, [place]   Fam HX:    Family History  Problem Relation Age of Onset  . Heart attack      father had MI in his 11s, no fhx of early heart problem before age 6  . Heart attack      mother died of MI around 73   Social HX:    History   Social History  . Marital Status: Divorced    Spouse Name: N/A  . Number of Children: N/A  . Years of Education: N/A   Occupational History  . Retired    Social History Main Topics  . Smoking status: Never Smoker   . Smokeless tobacco: Never Used  . Alcohol Use: No  . Drug Use: No  . Sexual Activity: No   Other Topics Concern  . Not on file   Social History Narrative   [name], Spanish only (few words of English). Lives with wife.     ROS:  Denies any bleeding, syncope. He does not state that he is short of breath but he does state that he has had some chest pain. He would like to stay in the hospital. All 11 ROS were addressed and are negative except what is stated in the HPI   Physical Exam: Blood pressure 93/53, pulse 83, temperature 97.6 F (36.4 C), temperature source Oral, resp. rate 20, weight 125 lb 3.2 oz (56.79 kg), SpO2 97 %.   General: Thin, elderly, somewhat frail-appearing, in no acute distress Head: Eyes PERRLA, No xanthomas.   Normal cephalic and atramatic  Lungs:  Clear bilaterally to auscultation and percussion. Normal respiratory effort. No wheezes, no rales. Heart:  HRRR S1 S2 +S3 Pulses are 2+ & equal. No murmurs, rubs or gallops.             No carotid bruit. Mid neck JVD.  No abdominal bruits. PICC line in place Abdomen: Bowel sounds are positive, abdomen soft and non-tender without masses or  Hernia's noted. No hepatosplenomegaly. Msk:  Back normal, normal gait. Normal strength and tone for age. Extremities:  No clubbing, cyanosis, 1+BLE edema.  DP +1 Neuro: Alert and oriented X 3, non-focal, MAE x 4 GU: Deferred Rectal: Deferred Psych:  Good affect, responds appropriately, mildly anxious. Worked with interpreter.         Labs:   Lab Results  Component Value Date   WBC 11.7* 10/25/2014   HGB 12.3* 10/25/2014   HCT 37.4* 10/25/2014   MCV 77.0* 10/25/2014   PLT 321 10/25/2014    Recent Labs Lab 10/25/14 0409  NA 127*  K 5.2*  CL 88*  CO2 24  BUN 48*  CREATININE 2.38*  CALCIUM 9.1  GLUCOSE 197*   No results for input(s): CKTOTAL, CKMB, TROPONINI in the last 72 hours. Lab Results  Component Value Date   CHOL 128 06/12/2012   HDL 27* 06/12/2012   LDLCALC 78 06/12/2012   TRIG 113 06/12/2012   Lab Results  Component Value Date   DDIMER 0.59* 06/05/2014     Radiology:  Dg Chest 2 View  10/25/2014   CLINICAL DATA:  Chest pain and shortness breath beginning tonight. History of hypertension, CHF, myocardial infarction, pulmonary hypertension, atrial fibrillation.  EXAM: CHEST  2 VIEW  COMPARISON:  Chest radiograph September 21, 2014  FINDINGS: The cardiac silhouette is moderately enlarged, tortuous calcified aorta. Mild chronic interstitial changes, improved from prior examination without pleural effusion or focal consolidation. No pneumothorax. Tunneled catheter via RIGHT internal jugular venous approach, distal tip projects in mid to distal superior vena cava. LEFT cardiac defibrillator in situ. No pneumothorax though, lung apices partially obscured by facial structures. Osteopenia. Soft tissue planes are nonsuspicious.  IMPRESSION: Stable cardiomegaly. Mild chronic interstitial changes, no acute pulmonary process.   Electronically Signed   By: Elon Alas   On: 10/25/2014 05:11   Personally viewed.   EKG:  Biventricular pacing noted. Personally  viewed. Echocardiogram: EF 20%  ASSESSMENT/PLAN:   81 year old with stage D chronic systolic heart failure here with acute kidney injury, creatinine rise from 1.5 up to 2.38. On home milrinone. DO NOT RESUSCITATE.  1. Chronic systolic heart failure-ejection fraction has been 20%, status post Medtronic CRT/D. Discussed with Heart Failure attending regarding milrinone and recent increase in creatinine and we will continue currently at 0.5 dosage. He does not appear to have increased work of breathing and is laying quite comfortably in the 20 position in bed. Edema may be slightly less. His weight is 125 which is slightly less than 2 days ago. Because of his recent rising creatinine, I will hold his metolazone. Continue all other medications at this time. We will check basic metabolic profile in the morning. If creatinine continues to move in the wrong direction, we will continue to adjust medications likewise.  -Not on beta blocker with history of low output and milrinone -Not on ACE inhibitor due to rash and no longer on angiotensin receptor blocker. I will hold spironolactone given his potassium of 5.2 and recent increase of creatinine 2.38.  -We will watch this as he has had issues with hypokalemia in the past. -Has had issues with drinking too much water/ice at home.  2. Paroxysmal atrial fibrillation/flutter-converted back to normal sinus rhythm 12/27/2013. Has had recurrent atrial flutter recently. Paroxysmal. Was back in sinus rhythm on 10/23/14. Amiodarone recently increased to 200 mg twice a day. Continue with this dose.  3. Pulmonary arterial hypertension-continue with revatio  4. Chronic kidney disease stage III-recent increase.  Monitor closely.  5. Acute kidney injury-perhaps from commendation of low output heart failure as well as potential hypovolemia. We will check Co-Oximetry, cvp.  6. Hypokalemia-this is been an issue in the past. He used to throw away his potassium. Now is  apparently taking this at home.  7. DO NOT RESUSCITATE. Continue to revisit palliative care/hospice care options. He was at assisted living facility but now is back home. His wife currently is not at home. I wonder if this is playing a role also in his emergency room visit. Clearly however he does have an increasing creatinine.  Candee Furbish, MD  10/25/2014  9:16 AM

## 2014-10-25 NOTE — ED Provider Notes (Signed)
  Physical Exam  BP 97/55 mmHg  Pulse 80  Temp(Src) 97.6 F (36.4 C) (Oral)  Resp 13  Wt 125 lb 3.2 oz (56.79 kg)  SpO2 99%  Physical Exam  ED Course  Procedures  MDM Patient had been put up for discharge by Dr.Otter. Patient states he did not feel good enough to go home. Seen by cardiology and admitted      Davonna Belling, MD 10/25/14 [phone]

## 2014-10-25 NOTE — ED Notes (Signed)
This RN attempted to discharge the pt, but pt states that he does not want to go home, that he does not feel better, and he thinks he should stay. This RN explained to the pt that Sharol Given, MD spoke with cardiology and they decided that it was ok for him to go home. Pt is adamant that he is not well enough to go home. Alvino Chapel, MD notified.

## 2014-10-25 NOTE — ED Notes (Addendum)
Dr Marlou Porch with cardiology at bedside speaking with pt on translator phone.  RN notified MD that it is difficult to assess if milnarone pump is working.

## 2014-10-25 NOTE — ED Provider Notes (Signed)
CSN: [id number]     Arrival date & time 10/25/14  0359 History   First MD Initiated Contact with Patient 10/25/14 0457     Chief Complaint  Patient presents with  . Shortness of Breath     (Consider location/radiation/quality/duration/timing/severity/associated sxs/prior Treatment) Patient is a 81 y.o. male presenting with shortness of breath.  Shortness of Breath Associated symptoms: chest pain    81 year old male presents to emergency department via EMS from home with complaint of chest pain and shortness of breath.  He reports since arrival, he is feeling much better.  He reports the chest pain and shortness of breath has been going on for some time.  It was slightly worse tonight.  He was seen in the cardiac CHF clinic yesterday, and his Lasix was increased.  Patient reports that he has been compliant with his fluid and salt restriction.  He reports that he is very thirsty at this time.  He reports that his chest pain is very slight.  His reports that his shortness of breath is also only very slight.  He denies any fever, chills, nausea, vomiting or diarrhea. Past Medical History  Diagnosis Date  . HTN (hypertension)   . High cholesterol   . CHF (congestive heart failure)   . BBBB (bilateral bundle branch block)   . LBBB (left bundle branch block)   . Seizures 06/11/2012    new onset/notes (06/11/2012)  . SOB (shortness of breath)     "sometimes when I lay down;; related to not taking my RX" (06/11/2012)  . Myocardial infarction     06/10/2012  . Atrial fibrillation   . Stroke   . Atrial thrombus     left  . Pulmonary HTN   . Atrial fibrillation 06/12/2012  . Coronary artery disease    Past Surgical History  Procedure Laterality Date  . Throat surgery  1942    "and nose" (06/11/2012); ?T&A  . Inguinal hernia repair  ~ 2008    "both sides" (06/11/2012)  . Tee without cardioversion  06/29/2012    Procedure: TRANSESOPHAGEAL ECHOCARDIOGRAM (TEE);  Surgeon: Jolaine Artist,  MD;  Location: [facility] ENDOSCOPY;   . Cardioversion N/A 08/14/2012    Procedure: CARDIOVERSION;  Surgeon: Jolaine Artist, MD;  Location: [facility] ENDOSCOPY;  . Tee without cardioversion N/A 11/12/2013    Procedure: TRANSESOPHAGEAL ECHOCARDIOGRAM (TEE);  Surgeon: Candee Furbish, MD;  Location: [facility] ENDOSCOPY;   . Tee without cardioversion N/A 11/19/2013    Procedure: TRANSESOPHAGEAL ECHOCARDIOGRAM (TEE);  Surgeon: Jolaine Artist, MD;  Location: [facility] ENDOSCOPY;   . Cardioversion N/A 12/27/2013    Procedure: CARDIOVERSION;  Surgeon: Jolaine Artist, MD;  Location: [facility] ENDOSCOPY;   . Left and right heart catheterization with coronary angiogram N/A 07/02/2012    Procedure: LEFT AND RIGHT HEART CATHETERIZATION WITH CORONARY ANGIOGRAM;  Surgeon: Jolaine Artist, MD;  Location: [place] CATH LAB; Mild non-obstructive CAD, severe PAH, PA = 93/42 (59), severe NICM w/ EF 15%  . Right heart catheterization N/A 07/06/2012    Procedure: RIGHT HEART CATH;  Surgeon: Jolaine Artist, MD; to assess response to milrinone; Moderate PH with profound reduction in PVR with milrinone/PDE-5 inhibitor   . Bi-ventricular implantable cardioverter defibrillator N/A 10/26/2012    Procedure: BI-VENTRICULAR IMPLANTABLE CARDIOVERTER DEFIBRILLATOR  (CRT-D);  Surgeon: Evans Lance, MD;  Medtronic Evera XT CRTD, BiV ICD serial #[id number]     Family History  Problem Relation Age of Onset  . Heart attack      father  had MI in his 34s, no fhx of early heart problem before age 94  . Heart attack      mother died of MI around 1998-09-28   History  Substance Use Topics  . Smoking status: Never Smoker   . Smokeless tobacco: Never Used  . Alcohol Use: No    Review of Systems  Respiratory: Positive for chest tightness and shortness of breath.   Cardiovascular: Positive for chest pain.      Allergies  Lisinopril  Home Medications   Prior to Admission medications   Medication Sig Start Date End Date Taking? Authorizing Provider   acetaminophen (TYLENOL) 325 MG tablet Take 2 tablets (650 mg total) by mouth every 6 (six) hours as needed for mild pain (or Fever >/= 101). 07/10/14   Kinnie Feil, MD  amiodarone (PACERONE) 200 MG tablet Take 1 tablet (200 mg total) by mouth 2 (two) times daily. 10/23/14   Jolaine Artist, MD  LORazepam (ATIVAN) 1 MG tablet Take 1 tablet (1 mg total) by mouth at bedtime as needed for anxiety. 09/29/14   [name] D Clegg, NP  magnesium oxide (MAG-OX) 400 MG tablet Take 1 tablet (400 mg total) by mouth 2 (two) times daily. 04/25/14   Jolaine Artist, MD  metoCLOPramide (REGLAN) 10 MG tablet Take 1 tablet (10 mg total) by mouth 2 (two) times daily. 07/31/14   Jolaine Artist, MD  metolazone (ZAROXOLYN) 2.5 MG tablet Tomar una tableta (2.5 mg) Lunes y Rudi Coco 10/09/14   Jolaine Artist, MD  milrinone [facility]) 20 MG/100ML SOLN infusion Inject 33.95 mcg/min into the vein continuous. 09/28/14   Almyra Deforest, PA  polyethylene glycol (MIRALAX / GLYCOLAX) packet Take 17 g by mouth daily. 08/23/14   Isaiah Serge, NP  potassium chloride SA (K-DUR,KLOR-CON) 20 MEQ tablet Take 2 tablets (40 mEq total) by mouth 2 (two) times daily. 10/09/14   Jolaine Artist, MD  sildenafil (REVATIO) 20 MG tablet Take 1 tablet (20 mg total) by mouth 3 (three) times daily. 09/28/14   Almyra Deforest, PA  sorbitol 70 % SOLN Take 30 mLs by mouth daily. 09-28-2014   Almyra Deforest, PA  spironolactone (ALDACTONE) 25 MG tablet Take 1 tablet (25 mg total) by mouth 2 (two) times daily. 09/28/2014   Almyra Deforest, PA  torsemide (DEMADEX) 20 MG tablet Take 4 tablets (80 mg total) by mouth 2 (two) times daily. Replace Monday Wednesday and Friday morning demadex with 80mg  IV lasix. [facility] to manage 09-28-2014   Almyra Deforest, PA  warfarin (COUMADIN) 4 MG tablet Take 1 tablet (4 mg total) by mouth daily at 6 PM. Take 4 mg Monday, Wednesday, Friday and 28-Sep-2022. 2 mg on Tues, Thurs and Sat Patient taking differently: Take 2-4 mg by mouth daily at 6 PM. Take 4 mg Monday,  Wednesday, Friday and 28-Sep-2022. 2 mg on Tues, Thurs and Sat 09-28-14   Almyra Deforest, PA   BP 97/55 mmHg  Pulse 85  Temp(Src) 97.6 F (36.4 C) (Oral)  Resp 22  Wt 125 lb 3.2 oz (56.79 kg)  SpO2 96% Physical Exam  Constitutional: He is oriented to person, place, and time. He appears well-developed and well-nourished. No distress.  HENT:  Head: Normocephalic and atraumatic.  Nose: Nose normal.  Mouth/Throat: Oropharynx is clear and moist.  Eyes: Conjunctivae and EOM are normal. Pupils are equal, round, and reactive to light.  Neck: Normal range of motion. Neck supple. No JVD present. No tracheal deviation present. No thyromegaly present.  Cardiovascular: Normal rate, regular rhythm, normal heart sounds and intact distal pulses.  Exam reveals no gallop and no friction rub.   No murmur heard. Pulmonary/Chest: Effort normal and breath sounds normal. No stridor. No respiratory distress. He has no wheezes. He has no rales. He exhibits no tenderness.  Abdominal: Soft. Bowel sounds are normal. He exhibits no distension and no mass. There is no tenderness. There is no rebound and no guarding.  Musculoskeletal: Normal range of motion. He exhibits no edema or tenderness.  Lymphadenopathy:    He has no cervical adenopathy.  Neurological: He is alert and oriented to person, place, and time. He displays normal reflexes. He exhibits normal muscle tone. Coordination normal.  Skin: Skin is warm and dry. No rash noted. No erythema. No pallor.  Psychiatric: He has a normal mood and affect. His behavior is normal. Judgment and thought content normal.  Nursing note and vitals reviewed.   ED Course  Procedures (including critical care time) Labs Review Labs Reviewed  CBC - Abnormal; Notable for the following:    WBC 11.7 (*)    Hemoglobin 12.3 (*)    HCT 37.4 (*)    MCV 77.0 (*)    MCH 25.3 (*)    RDW 18.3 (*)    All other components within normal limits  BASIC METABOLIC PANEL - Abnormal; Notable for the  following:    Sodium 127 (*)    Potassium 5.2 (*)    Chloride 88 (*)    Glucose, Bld 197 (*)    BUN 48 (*)    Creatinine, Ser 2.38 (*)    GFR calc non Af Amer 24 (*)    GFR calc Af Amer 28 (*)    All other components within normal limits  BRAIN NATRIURETIC PEPTIDE - Abnormal; Notable for the following:    B Natriuretic Peptide 1882.5 (*)    All other components within normal limits  I-STAT TROPOININ, ED    Imaging Review Dg Chest 2 View  10/25/2014   CLINICAL DATA:  Chest pain and shortness breath beginning tonight. History of hypertension, CHF, myocardial infarction, pulmonary hypertension, atrial fibrillation.  EXAM: CHEST  2 VIEW  COMPARISON:  Chest radiograph September 21, 2014  FINDINGS: The cardiac silhouette is moderately enlarged, tortuous calcified aorta. Mild chronic interstitial changes, improved from prior examination without pleural effusion or focal consolidation. No pneumothorax. Tunneled catheter via RIGHT internal jugular venous approach, distal tip projects in mid to distal superior vena cava. LEFT cardiac defibrillator in situ. No pneumothorax though, lung apices partially obscured by facial structures. Osteopenia. Soft tissue planes are nonsuspicious.  IMPRESSION: Stable cardiomegaly. Mild chronic interstitial changes, no acute pulmonary process.   Electronically Signed   By: Elon Alas   On: 10/25/2014 05:11     EKG Interpretation   Date/Time:  Saturday Oct 25 2014 04:02:58 EDT Ventricular Rate:  108 PR Interval:  191 QRS Duration: 228 QT Interval:  313 QTC Calculation: 419 R Axis:   -110 Text Interpretation:  A-V dual-paced complexes w/ some inhibition No  further analysis attempted due to paced rhythm Artifact in lead(s) I III  aVL Confirmed by Mable Dara  MD, Lamont Glasscock ([id number]) on 10/25/2014 5:10:33 AM      MDM   Final diagnoses:  CHF (congestive heart failure), NYHA class IV, chronic, combined    81 yo male with chest pain, shortness of breath, acute on  chronic, now improved without intervention.  Workup today shows decrease in weight 4 lb, increased creatine.  Negative troponin, decreased bnp, cxr without fluid.  Case d/w Dr Posey Pronto on call for cardiology.  Pt with severe end stage chf, on milrinone.  Will have home health hold lasix on Monday, recheck creatinine.  Dr Posey Pronto to relay pt's information to his cardiologist, Dr Haroldine Laws.      Linton Flemings, MD 10/25/14 [phone]

## 2014-10-25 NOTE — ED Notes (Signed)
Pt states that his shortness of breath and chest pain started this morning. He also states that he was discharged less than 24 hours ago.

## 2014-10-26 DIAGNOSIS — I480 Paroxysmal atrial fibrillation: Secondary | ICD-10-CM

## 2014-10-26 LAB — BASIC METABOLIC PANEL
ANION GAP: 13 (ref 5–15)
BUN: 55 mg/dL — ABNORMAL HIGH (ref 6–20)
CALCIUM: 8.5 mg/dL — AB (ref 8.9–10.3)
CO2: 29 mmol/L (ref 22–32)
Chloride: 82 mmol/L — ABNORMAL LOW (ref 101–111)
Creatinine, Ser: 2.14 mg/dL — ABNORMAL HIGH (ref 0.61–1.24)
GFR calc non Af Amer: 28 mL/min — ABNORMAL LOW (ref >60)
GFR, EST AFRICAN AMERICAN: 32 mL/min — AB (ref >60)
Glucose, Bld: 134 mg/dL — ABNORMAL HIGH (ref 70–99)
Potassium: 4.1 mmol/L (ref 3.5–5.1)
SODIUM: 124 mmol/L — AB (ref 135–145)

## 2014-10-26 LAB — PROTIME-INR
INR: 2.12 — AB (ref 0.00–1.49)
Prothrombin Time: 24 seconds — ABNORMAL HIGH (ref 11.6–15.2)

## 2014-10-26 MED ORDER — WARFARIN SODIUM 2 MG PO TABS
2.0000 mg | ORAL_TABLET | Freq: Once | ORAL | Status: AC
  Administered 2014-10-26: 2 mg via ORAL
  Filled 2014-10-26: qty 1

## 2014-10-26 NOTE — Progress Notes (Signed)
MD aware of pt BP, continue to observe

## 2014-10-26 NOTE — Progress Notes (Signed)
Subjective:  Wanting more milk, water. No SOB, no CP.   Objective:  Vital Signs in the last 24 hours: Temp:  [97.6 F (36.4 C)-98.4 F (36.9 C)] 97.9 F (36.6 C) (05/08 0747) Pulse Rate:  [54-84] 71 (05/08 0747) Resp:  [12-24] 16 (05/08 0747) BP: (80-106)/(46-63) 83/55 mmHg (05/08 0747) SpO2:  [91 %-100 %] 96 % (05/08 0747) Weight:  [121 lb 4.1 oz (55 kg)-124 lb 9 oz (56.5 kg)] 124 lb 9 oz (56.5 kg) (05/08 0321)  Intake/Output from previous day: 05/07 0701 - 05/08 0700 In: 803.3 [P.O.:670; I.V.:133.3] Out: 975 [Urine:975]   Physical Exam: General: Thin, fragile, in no acute distress. Head:  Normocephalic and atraumatic. Lungs: Clear to auscultation and percussion. Heart: Normal S1 and S2.  +S3 No murmur, rubs or gallops. Mid neck JVD Abdomen: soft, non-tender, positive bowel sounds. Extremities: No clubbing or cyanosis. No edema. Sacral bandage Neurologic: Alert and oriented x 3.    Lab Results:  Recent Labs  10/25/14 0409  WBC 11.7*  HGB 12.3*  PLT 321    Recent Labs  10/25/14 0409 10/26/14 0325  NA 127* 124*  K 5.2* 4.1  CL 88* 82*  CO2 24 29  GLUCOSE 197* 134*  BUN 48* 55*  CREATININE 2.38* 2.14*    Imaging: Dg Chest 2 View  10/25/2014   CLINICAL DATA:  Chest pain and shortness breath beginning tonight. History of hypertension, CHF, myocardial infarction, pulmonary hypertension, atrial fibrillation.  EXAM: CHEST  2 VIEW  COMPARISON:  Chest radiograph September 21, 2014  FINDINGS: The cardiac silhouette is moderately enlarged, tortuous calcified aorta. Mild chronic interstitial changes, improved from prior examination without pleural effusion or focal consolidation. No pneumothorax. Tunneled catheter via RIGHT internal jugular venous approach, distal tip projects in mid to distal superior vena cava. LEFT cardiac defibrillator in situ. No pneumothorax though, lung apices partially obscured by facial structures. Osteopenia. Soft tissue planes are nonsuspicious.   IMPRESSION: Stable cardiomegaly. Mild chronic interstitial changes, no acute pulmonary process.   Electronically Signed   By: Elon Alas   On: 10/25/2014 05:11   Personally viewed.   Telemetry: No VT Personally viewed.   Assessment/Plan:    81 year old with stage D chronic systolic heart failure here with acute kidney injury, creatinine rise from 1.5 up to 2.38 on admit. On home milrinone. DO NOT RESUSCITATE.  1. Chronic systolic heart failure-ejection fraction has been 20%, status post Medtronic CRT/D. Discussed with Heart Failure attending regarding milrinone and recent increase in creatinine and we will continue currently at 0.5 dosage. Creat mildly decreased to 2.1, BUN increased. He does not appear to have increased work of breathing. Edema may be slightly less. His weight is 124 which is slightly less than 2 days ago. Because of his recent rising creatinine, I will continue hold his metolazone and spironolactone. Continue all other medications at this time. We will check basic metabolic profile in the morning. If creatinine continues to move in the wrong direction, we will continue to adjust medications likewise.  -Not on beta blocker with history of low output and milrinone -Not on ACE inhibitor due to rash and no longer on angiotensin receptor blocker. I will hold spironolactone given his potassium of 5.2 and recent increase of creatinine 2.38.  -We will watch this as he has had issues with hypokalemia in the past. -Has had issues with drinking too much water/ice at home.  2. Paroxysmal atrial fibrillation/flutter-converted back to normal sinus rhythm 12/27/2013. Has had recurrent atrial  flutter recently. Paroxysmal. Was back in sinus rhythm on 10/23/14. Amiodarone recently increased to 200 mg twice a day. Continue with this dose.  3. Pulmonary arterial hypertension-continue with revatio  4. Chronic kidney disease stage III-recent increase. Monitor closely.  5. Acute kidney  injury-perhaps from commendation of low output heart failure as well as potential hypovolemia. We will check Co-Oximetry, cvp.  6. Hypokalemia-this is been an issue in the past. He used to throw away his potassium. Now is apparently taking this at home.  7. DO NOT RESUSCITATE. Continue to revisit palliative care/hospice care options. He was at assisted living facility but now is back home. His wife currently is not at home. I wonder if this is playing a role also in his emergency room visit. Clearly however he does have an increasing creatinine.   SKAINS, [place] 10/26/2014, 8:55 AM

## 2014-10-26 NOTE — Progress Notes (Signed)
ANTICOAGULATION CONSULT NOTE - Initial Consult  Pharmacy Consult for warfarin Indication: atrial fibrillation  Allergies  Allergen Reactions  . Lisinopril Rash    ( pt denies this allergy)    Patient Measurements: Height: 5\' 5"  (165.1 cm) Weight: 124 lb 9 oz (56.5 kg) IBW/kg (Calculated) : 61.5 Heparin Dosing Weight:   Vital Signs: Temp: 97.9 F (36.6 C) (05/08 0747) Temp Source: Oral (05/08 0747) BP: 83/55 mmHg (05/08 0747) Pulse Rate: 71 (05/08 0747)  Labs:  Recent Labs  10/25/14 0409 10/25/14 1853 10/26/14 0325  HGB 12.3*  --   --   HCT 37.4*  --   --   PLT 321  --   --   LABPROT  --  25.4* 24.0*  INR  --  2.29* 2.12*  CREATININE 2.38*  --  2.14*    Estimated Creatinine Clearance: 22.4 mL/min (by C-G formula based on Cr of 2.14).   Medical History: Past Medical History  Diagnosis Date  . HTN (hypertension)   . High cholesterol   . CHF (congestive heart failure)   . BBBB (bilateral bundle branch block)   . LBBB (left bundle branch block)   . Seizures 06/11/2012    new onset/notes (06/11/2012)  . SOB (shortness of breath)     "sometimes when I lay down;; related to not taking my RX" (06/11/2012)  . Myocardial infarction     06/10/2012  . Atrial fibrillation   . Stroke   . Atrial thrombus     left  . Pulmonary HTN   . Atrial fibrillation 06/12/2012  . Coronary artery disease     Assessment: 81 year old male with end stage heart failure well known to pharmacy from HF Service. Patient is on chronic warfarin for afib.  Warfarin (4 mg) on Monday,(2 mg) on Sunday, Tuesday, Wednesday, Thursday, Friday, Saturday (per anti-coag visit 10/20/14)  INR at goal 2.12. Will Continue with home dosing of 2mg  tonight. No bleeding issues reported at this time.  CBC stable.    Of note, patient is on amiodarone at home.  Home dose has been continued inpatient at this time and therefore I do not expect a significant change in INR from this.    Goal of Therapy:  INR  2-3 Monitor platelets by anticoagulation protocol: Yes   Plan:  - Warfarin 2mg  x 1 tonight - Daily INR - Monitor for signs and symptoms of bleeding  Hassie Bruce, Pharm. D. Clinical Pharmacy Resident Pager: [phone] Ph: [phone] 10/26/2014 11:35 AM

## 2014-10-26 NOTE — Progress Notes (Signed)
Pt refuses to keep sat probe and bp cuff on

## 2014-10-27 ENCOUNTER — Encounter: Payer: Self-pay | Admitting: Internal Medicine

## 2014-10-27 DIAGNOSIS — I5023 Acute on chronic systolic (congestive) heart failure: Principal | ICD-10-CM

## 2014-10-27 LAB — BASIC METABOLIC PANEL
Anion gap: 11 (ref 5–15)
BUN: 56 mg/dL — ABNORMAL HIGH (ref 6–20)
CO2: 29 mmol/L (ref 22–32)
Calcium: 8.3 mg/dL — ABNORMAL LOW (ref 8.9–10.3)
Chloride: 80 mmol/L — ABNORMAL LOW (ref 101–111)
Creatinine, Ser: 2.25 mg/dL — ABNORMAL HIGH (ref 0.61–1.24)
GFR calc Af Amer: 30 mL/min — ABNORMAL LOW (ref >60)
GFR, EST NON AFRICAN AMERICAN: 26 mL/min — AB (ref >60)
GLUCOSE: 131 mg/dL — AB (ref 70–99)
Potassium: 4.5 mmol/L (ref 3.5–5.1)
Sodium: 120 mmol/L — ABNORMAL LOW (ref 135–145)

## 2014-10-27 LAB — PROTIME-INR
INR: 2.32 — AB (ref 0.00–1.49)
PROTHROMBIN TIME: 25.7 s — AB (ref 11.6–15.2)

## 2014-10-27 LAB — CARBOXYHEMOGLOBIN
CARBOXYHEMOGLOBIN: 1.6 % — AB (ref 0.5–1.5)
Methemoglobin: 1.1 % (ref 0.0–1.5)
O2 SAT: 83.2 %
TOTAL HEMOGLOBIN: 8.9 g/dL — AB (ref 13.5–18.0)

## 2014-10-27 MED ORDER — WARFARIN SODIUM 4 MG PO TABS
4.0000 mg | ORAL_TABLET | Freq: Once | ORAL | Status: AC
  Administered 2014-10-27: 4 mg via ORAL
  Filled 2014-10-27: qty 1

## 2014-10-27 MED ORDER — ADULT MULTIVITAMIN W/MINERALS CH
1.0000 | ORAL_TABLET | Freq: Every day | ORAL | Status: DC
  Administered 2014-10-27 – 2014-10-30 (×4): 1 via ORAL
  Filled 2014-10-27 (×4): qty 1

## 2014-10-27 NOTE — Progress Notes (Signed)
Initial Nutrition Assessment  DOCUMENTATION CODES:  Not applicable  INTERVENTION:  Ensure Enlive (each supplement provides 350kcal and 20 grams of protein), MVI  NUTRITION DIAGNOSIS:  Predicted suboptimal nutrient intake related to chronic illness as evidenced by per patient/family report, estimated needs.  GOAL:  Patient will meet greater than or equal to 90% of their needs  MONITOR:  PO intake, Supplement acceptance, Labs, Weight trends, I & O's, Skin  REASON FOR ASSESSMENT:  Malnutrition Screening Tool    ASSESSMENT: From Dr. Clayborne Christensen recent clinic note 10/23/14: [name] is a 81 y.o. [name] (non-english speaking) w/ PMHx significant for chronic systolic CHF (EF 58%), NICM, LBBB, PAF (on coumadin), HTN, HLD, CVA, Pulmonary Hypertension and h/o seizure d/o (2/2 CVA 05/2012).   Pt admitted with chronic systolic heart failure.  Pt asleep at time of visit and unable to arouse pt for interview. Noted pt is Spanish-speaking.  Per chart review, pt was recently in ALF, but left after a week. Suspect there may be lack of support for pt in home environment.  Wt hx reveals a 6# (4.6%) wt loss x 1 month. Meal completion 50-100%. Pt already has Ensure ordered BID. RD will continue supplement. Unable to complete entire nutrition-focused physical exam, but noted moderate depletion in temple and orbital areas. Suspect fat and muscle depletion on other areas as well.  Per MD notes, plan is to visit palliative care/hospice options. He has refused hospice services in the past.  Labs reviewed. Na: 120, Cl: 80, BUN/Creat: 56/2.25, Calcium: 8.3, Glucose: 131.   Height:  Ht Readings from Last 1 Encounters:  10/25/14 5\' 5"  (1.651 m)    Weight:  Wt Readings from Last 1 Encounters:  10/27/14 124 lb 5.4 oz (56.4 kg)    Ideal Body Weight:  61.8 kg  Wt Readings from Last 10 Encounters:  10/27/14 124 lb 5.4 oz (56.4 kg)  09/29/14 130 lb 14.4 oz (59.376 kg)  09/18/14 130 lb (58.968  kg)  09/15/14 137 lb 9.6 oz (62.415 kg)  09/12/14 138 lb (62.596 kg)  08/28/14 140 lb 6.4 oz (63.685 kg)  08/23/14 135 lb 12.9 oz (61.6 kg)  08/17/14 140 lb 9.6 oz (63.776 kg)  07/31/14 144 lb 6.4 oz (65.499 kg)  07/18/14 148 lb 6.4 oz (67.314 kg)    BMI:  Body mass index is 20.69 kg/(m^2). Normal weight range  Estimated Nutritional Needs:  Kcal:  1700-1900  Protein:  70-80 grams  Fluid:  1.7-1.9 L  Skin:  Reviewed, no issues (lt elbow incision)  Diet Order:  Diet Heart Room service appropriate?: Yes; Fluid consistency:: Thin  EDUCATION NEEDS:  Education needs no appropriate at this time   Intake/Output Summary (Last 24 hours) at 10/27/14 1026 Last data filed at 10/27/14 0900  Gross per 24 hour  Intake 675.51 ml  Output   1375 ml  Net -699.49 ml    Last BM:  PTA  Tyler Christensen A. Jimmye Norman, RD, LDN, CDE Pager: [phone] After hours Pager: [phone]

## 2014-10-27 NOTE — Progress Notes (Signed)
ANTICOAGULATION CONSULT NOTE - Follow Up Consult  Pharmacy Consult for Coumadin Indication: atrial fibrillation  Allergies  Allergen Reactions  . Lisinopril Rash    ( pt denies this allergy)    Patient Measurements: Height: 5\' 5"  (165.1 cm) Weight: 124 lb 5.4 oz (56.4 kg) IBW/kg (Calculated) : 61.5  Vital Signs: Temp: 98.3 F (36.8 C) (05/09 1145) Temp Source: Oral (05/09 1145) BP: 86/49 mmHg (05/09 1145)  Labs:  Recent Labs  10/25/14 0409 10/25/14 1853 10/26/14 0325 10/27/14 0400  HGB 12.3*  --   --   --   HCT 37.4*  --   --   --   PLT 321  --   --   --   LABPROT  --  25.4* 24.0* 25.7*  INR  --  2.29* 2.12* 2.32*  CREATININE 2.38*  --  2.14* 2.25*    Estimated Creatinine Clearance: 21.2 mL/min (by C-G formula based on Cr of 2.25).  Assessment: 81yom well known to heart failure team on coumadin pta for afib. INR on admit therapeutic and home dose continued. INR remains at goal. No bleeding reported.  Home dose: 2mg  daily except 4mg  on Monday  Goal of Therapy:  INR 2-3 Monitor platelets by anticoagulation protocol: Yes   Plan:  1) Coumadin 4mg  x 1 per home regimen 2) Daily INR  Deboraha Sprang 10/27/2014,2:27 PM

## 2014-10-27 NOTE — Progress Notes (Signed)
Subjective:   Sleepy this morning. Denies dyspnea. Creatinine continues to worse. On demadex 80 bid.   Objective:  Vital Signs in the last 24 hours: Temp:  [97.5 F (36.4 C)-98.8 F (37.1 C)] 98.5 F (36.9 C) (05/09 0805) Pulse Rate:  [73] 73 (05/08 1141) Resp:  [13-22] 20 (05/09 0805) BP: (69-91)/(30-60) 83/48 mmHg (05/09 0805) SpO2:  [92 %-96 %] 94 % (05/09 0805) Weight:  [56.4 kg (124 lb 5.4 oz)] 56.4 kg (124 lb 5.4 oz) (05/09 0402)  Intake/Output from previous day: 05/08 0701 - 05/09 0700 In: 924 [P.O.:720; I.V.:204] Out: 1400 [Urine:1400]   Physical Exam: General: Thin, fragile, in no acute distress. Head:  Normocephalic and atraumatic. Lungs: Clear to auscultation and percussion. Heart: Normal S1 and S2.  +S3 No murmur, rubs or gallops. JVP flat Abdomen: soft, non-tender, positive bowel sounds. Extremities: No clubbing or cyanosis. No edema. Sacral bandage Neurologic: Alert and oriented x 3.    Lab Results:  Recent Labs  10/25/14 0409  WBC 11.7*  HGB 12.3*  PLT 321    Recent Labs  10/26/14 0325 10/27/14 0400  NA 124* 120*  K 4.1 4.5  CL 82* 80*  CO2 29 29  GLUCOSE 134* 131*  BUN 55* 56*  CREATININE 2.14* 2.25*    Imaging: No results found. Personally viewed.   Telemetry: No VT Personally viewed.   Assessment/Plan:    81 year old with stage D chronic systolic heart failure here with acute kidney injury, creatinine rise from 1.5 up to 2.38 on admit. On home milrinone. DO NOT RESUSCITATE.  1. Chronic systolic heart failure-ejection fraction has been 20%, status post Medtronic CRT/D. Discussed with Heart Failure attending regarding milrinone and recent increase in creatinine and we will continue currently at 0.5 dosage. Creat mildly decreased to 2.1, BUN increased. He does not appear to have increased work of breathing. Edema may be slightly less. His weight is 124 which is slightly less than 2 days ago. Because of his recent rising  creatinine, I will continue hold his metolazone and spironolactone. Continue all other medications at this time. We will check basic metabolic profile in the morning. If creatinine continues to move in the wrong direction, we will continue to adjust medications likewise.  -Not on beta blocker with history of low output and milrinone -Not on ACE inhibitor due to rash and no longer on angiotensin receptor blocker. I will hold spironolactone given his potassium of 5.2 and recent increase of creatinine 2.38.  -We will watch this as he has had issues with hypokalemia in the past. -Has had issues with drinking too much water/ice at home.  2. Paroxysmal atrial fibrillation/flutter-converted back to normal sinus rhythm 12/27/2013. Has had recurrent atrial flutter recently. Paroxysmal. Was back in sinus rhythm on 10/23/14. Amiodarone recently increased to 200 mg twice a day. Continue with this dose.  3. Pulmonary arterial hypertension-continue with revatio  4. Chronic kidney disease stage III-recent increase. Monitor closely.  5. Acute kidney injury-perhaps from combination of low output heart failure as well as potential hypovolemia.   6. Hypokalemia-this is been an issue in the past. He used to throw away his potassium. Now is apparently taking this at home.  7. DO NOT RESUSCITATE. Continue to revisit palliative care/hospice care options. He was at assisted living facility but now is back home. His wife currently is not at home. I wonder if this is playing a role also in his emergency room visit. Clearly however he does have an increasing creatinine.  He continues to deteriorate. Renal function worse today. Suspect he is dry. Continue milrinone. Hold torsemide and potassium. Will check CVP and co-ox. Currently maintaining SR did have run of AFL/atrial tach last week but was back in NSR when we interrogated his device in clinic.    Glori Bickers MD 10/27/2014, 10:47 AM

## 2014-10-28 DIAGNOSIS — I5043 Acute on chronic combined systolic (congestive) and diastolic (congestive) heart failure: Secondary | ICD-10-CM

## 2014-10-28 DIAGNOSIS — N189 Chronic kidney disease, unspecified: Secondary | ICD-10-CM

## 2014-10-28 LAB — CARBOXYHEMOGLOBIN
CARBOXYHEMOGLOBIN: 1.4 % (ref 0.5–1.5)
Carboxyhemoglobin: 1.5 % (ref 0.5–1.5)
METHEMOGLOBIN: 1 % (ref 0.0–1.5)
Methemoglobin: 1.2 % (ref 0.0–1.5)
O2 SAT: 55.5 %
O2 SAT: 51.5 %
TOTAL HEMOGLOBIN: 8.6 g/dL — AB (ref 13.5–18.0)
Total hemoglobin: 8.6 g/dL — ABNORMAL LOW (ref 13.5–18.0)

## 2014-10-28 LAB — BASIC METABOLIC PANEL
Anion gap: 13 (ref 5–15)
BUN: 64 mg/dL — AB (ref 6–20)
CALCIUM: 8.2 mg/dL — AB (ref 8.9–10.3)
CO2: 28 mmol/L (ref 22–32)
Chloride: 79 mmol/L — ABNORMAL LOW (ref 101–111)
Creatinine, Ser: 2.2 mg/dL — ABNORMAL HIGH (ref 0.61–1.24)
GFR, EST AFRICAN AMERICAN: 31 mL/min — AB (ref >60)
GFR, EST NON AFRICAN AMERICAN: 27 mL/min — AB (ref >60)
GLUCOSE: 172 mg/dL — AB (ref 70–99)
POTASSIUM: 3.9 mmol/L (ref 3.5–5.1)
Sodium: 120 mmol/L — ABNORMAL LOW (ref 135–145)

## 2014-10-28 LAB — PROTIME-INR
INR: 2.33 — AB (ref 0.00–1.49)
PROTHROMBIN TIME: 25.8 s — AB (ref 11.6–15.2)

## 2014-10-28 LAB — MAGNESIUM: Magnesium: 2.1 mg/dL (ref 1.7–2.4)

## 2014-10-28 MED ORDER — WARFARIN SODIUM 2 MG PO TABS
2.0000 mg | ORAL_TABLET | Freq: Once | ORAL | Status: AC
  Administered 2014-10-28: 2 mg via ORAL
  Filled 2014-10-28: qty 1

## 2014-10-28 MED ORDER — SODIUM CHLORIDE 0.9 % IJ SOLN
10.0000 mL | INTRAMUSCULAR | Status: DC | PRN
  Administered 2014-10-28 – 2014-10-29 (×3): 10 mL
  Filled 2014-10-28 (×2): qty 40

## 2014-10-28 NOTE — Progress Notes (Signed)
    Subjective:   Yesterday spiro stopped. CVP 4. Weight unchanged. CO-OX 51% on milrinone 0.5 mcg.   Denies SOB.     Objective:  Vital Signs in the last 24 hours: Temp:  [97.4 F (36.3 C)-98.3 F (36.8 C)] 98.1 F (36.7 C) (05/10 0740) Pulse Rate:  [65-67] 67 (05/10 0325) Resp:  [17-27] 17 (05/10 0740) BP: (69-86)/(35-49) 82/49 mmHg (05/10 0740) SpO2:  [93 %-95 %] 95 % (05/10 0740) Weight:  [124 lb 5.4 oz (56.4 kg)] 124 lb 5.4 oz (56.4 kg) (05/10 0325)  Intake/Output from previous day: 05/09 0701 - 05/10 0700 In: 1313.5 [P.O.:1115; I.V.:198.5] Out: 1300 [Urine:1300]   Physical Exam: General: Thin, fragile, in no acute distress. Sitting on the side of the bed.  Head:  Normocephalic and atraumatic. Lungs: Clear to auscultation and percussion. Heart: Normal S1 and S2.  +S3 No murmur, rubs or gallops. JVP flat Abdomen: soft, non-tender, positive bowel sounds. Extremities: No clubbing or cyanosis. No edema. Sacral bandage Neurologic: Alert and oriented x 3.    Lab Results: No results for input(s): WBC, HGB, PLT in the last 72 hours.  Recent Labs  10/27/14 0400 10/28/14 0255  NA 120* 120*  K 4.5 3.9  CL 80* 79*  CO2 29 28  GLUCOSE 131* 172*  BUN 56* 64*  CREATININE 2.25* 2.20*    Imaging: No results found. Personally viewed.   Telemetry: No VT Personally viewed.   Assessment/Plan:   81 year old with stage D chronic systolic heart failure here with acute kidney injury, creatinine rise from 1.5 up to 2.38 on admit. On home milrinone. DO NOT RESUSCITATE.  1. Chronic systolic heart failure-ejection fraction has been 20%, status post Medtronic CRT/D. - CVP 4 continue hold diuretics.  -Not on beta blocker with history of low output and milrinone -Not on ACE inhibitor due to rash or spiro due to CKD.  - At some point will need spiro as K often runs low.   -2. Paroxysmal atrial fibrillation/flutter-converted back to normal sinus rhythm 12/27/2013. Has had  recurrent atrial flutter recently. Paroxysmal. Was back in sinus rhythm on 10/23/14. Amiodarone recently increased to 200 mg twice a day. Continue with this dose.  3. Pulmonary arterial hypertension-continue with revatio  4. Acute on Chronic renal failure - improved with holding diuretics  5. Hypokalemia- Today K 3.9   6. DO NOT RESUSCITATE.   CLEGG,[name] NP-C  10/28/2014, 8:07 AM  Patient seen and examined with Darrick Grinder, NP. We discussed all aspects of the encounter. I agree with the assessment and plan as stated above.   He continues with end-stage HF. Co-ox low despite milrinone 0.5. No other options at this point. Continue current therapy.  Will hold diuretics for one more day. Can transfer to floor. Hopefully home tomorrow. Refuses hospice.   Bensimhon, Daniel,MD 9:00 AM

## 2014-10-28 NOTE — Progress Notes (Signed)
Spanish translator called.  Pt will call family members to make aware of being transferred to floor.  VS stable.  Transferred in bed with NT.

## 2014-10-28 NOTE — Progress Notes (Signed)
Medicare Important Message given? YES  Date Medicare IM given:  10/28/2014 Medicare IM given by: Anagha Loseke  

## 2014-10-28 NOTE — Progress Notes (Signed)
ANTICOAGULATION CONSULT NOTE - Follow Up Consult  Pharmacy Consult for Coumadin Indication: atrial fibrillation  Allergies  Allergen Reactions  . Lisinopril Rash    ( pt denies this allergy)    Patient Measurements: Height: 5\' 5"  (165.1 cm) Weight: 124 lb 5.4 oz (56.4 kg) IBW/kg (Calculated) : 61.5  Vital Signs: Temp: 98.1 F (36.7 C) (05/10 0740) Temp Source: Oral (05/10 0740) BP: 82/49 mmHg (05/10 0740) Pulse Rate: 67 (05/10 0325)  Labs:  Recent Labs  10/26/14 0325 10/27/14 0400 10/28/14 0255  LABPROT 24.0* 25.7* 25.8*  INR 2.12* 2.32* 2.33*  CREATININE 2.14* 2.25* 2.20*    Estimated Creatinine Clearance: 21.7 mL/min (by C-G formula based on Cr of 2.2).  Assessment: 55 yom well known to heart failure team on coumadin pta for afib. INR on admit therapeutic and home dose continued. INR remains at goal. No bleeding reported.  Home dose: 2mg  daily except 4mg  on Monday  Goal of Therapy:  INR 2-3 Monitor platelets by anticoagulation protocol: Yes   Plan:  1) Coumadin 2 mg x 1 per home regimen 2) Daily INR  Uvaldo Rising, BCPS  Clinical Pharmacist Pager [phone]  10/28/2014 8:19 AM

## 2014-10-29 LAB — CBC
HEMATOCRIT: 30.1 % — AB (ref 39.0–52.0)
HEMOGLOBIN: 9.8 g/dL — AB (ref 13.0–17.0)
MCH: 24.6 pg — ABNORMAL LOW (ref 26.0–34.0)
MCHC: 32.6 g/dL (ref 30.0–36.0)
MCV: 75.6 fL — AB (ref 78.0–100.0)
Platelets: 245 10*3/uL (ref 150–400)
RBC: 3.98 MIL/uL — ABNORMAL LOW (ref 4.22–5.81)
RDW: 17.8 % — AB (ref 11.5–15.5)
WBC: 13.5 10*3/uL — ABNORMAL HIGH (ref 4.0–10.5)

## 2014-10-29 LAB — PROTIME-INR
INR: 2.08 — ABNORMAL HIGH (ref 0.00–1.49)
PROTHROMBIN TIME: 23.6 s — AB (ref 11.6–15.2)

## 2014-10-29 LAB — BASIC METABOLIC PANEL
Anion gap: 11 (ref 5–15)
BUN: 62 mg/dL — ABNORMAL HIGH (ref 6–20)
CO2: 30 mmol/L (ref 22–32)
CREATININE: 1.85 mg/dL — AB (ref 0.61–1.24)
Calcium: 8.7 mg/dL — ABNORMAL LOW (ref 8.9–10.3)
Chloride: 81 mmol/L — ABNORMAL LOW (ref 101–111)
GFR calc Af Amer: 38 mL/min — ABNORMAL LOW (ref >60)
GFR calc non Af Amer: 33 mL/min — ABNORMAL LOW (ref >60)
GLUCOSE: 128 mg/dL — AB (ref 70–99)
Potassium: 4.9 mmol/L (ref 3.5–5.1)
Sodium: 122 mmol/L — ABNORMAL LOW (ref 135–145)

## 2014-10-29 MED ORDER — WARFARIN SODIUM 2 MG PO TABS
2.0000 mg | ORAL_TABLET | Freq: Once | ORAL | Status: AC
  Administered 2014-10-29: 2 mg via ORAL
  Filled 2014-10-29: qty 1

## 2014-10-29 NOTE — Progress Notes (Signed)
Tyler Christensen continues to get up. I called translator Spero Geralds who reaffirmed Tyler Christensen he is to remain in bed and call when he needs to get up or needs help. Tyler Christensen understands.  Will continue to utilize fall prevention precautions and monitor Tyler Christensen.  Maurene Capes RN

## 2014-10-29 NOTE — Progress Notes (Signed)
    Subjective:   Lying in bed. Weak. Denies SOB. Asking for ice. Yesterday spiro stopped.  Weight unchanged.  Remains on milrinone 0.5. Co-ox 51%   INR 2.08   Objective:   . amiodarone  200 mg Oral BID  . feeding supplement (ENSURE ENLIVE)  237 mL Oral BID BM  . magnesium oxide  400 mg Oral BID  . metoCLOPramide  10 mg Oral BID  . multivitamin with minerals  1 tablet Oral Daily  . polyethylene glycol  17 g Oral Daily  . sildenafil  20 mg Oral TID  . sodium chloride  3 mL Intravenous Q12H  . sorbitol  30 mL Oral Daily  . Warfarin - Pharmacist Dosing Inpatient   Does not apply q1800  Vital Signs in the last 24 hours: Temp:  [97.6 F (36.4 C)-98.4 F (36.9 C)] 98.2 F (36.8 C) (05/11 0624) Pulse Rate:  [67-79] 70 (05/11 1004) Resp:  [13-18] 16 (05/11 1004) BP: (79-119)/(39-85) 83/46 mmHg (05/11 1004) SpO2:  [94 %-100 %] 96 % (05/11 1004) Weight:  [126 lb 9.6 oz (57.425 kg)-126 lb 14.4 oz (57.561 kg)] 126 lb 9.6 oz (57.425 kg) (05/11 0624)  Intake/Output from previous day: 05/10 0701 - 05/11 0700 In: 1000.5 [P.O.:785; I.V.:215.5] Out: 550 [Urine:550]   Physical Exam: General: Thin, fragile, in no acute distress. Sitting on the side of the bed.  Head:  Normocephalic and atraumatic. Lungs: Clear to auscultation and percussion. Heart: Normal S1 and S2.  +S3 No murmur, rubs or gallops. JVP flat Abdomen: soft, non-tender, positive bowel sounds. Extremities: No clubbing or cyanosis. No edema. Sacral bandage Neurologic: Alert and oriented x 3.    Lab Results:  Recent Labs  10/29/14 0605  WBC 13.5*  HGB 9.8*  PLT 245    Recent Labs  10/28/14 0255 10/29/14 0605  NA 120* 122*  K 3.9 4.9  CL 79* 81*  CO2 28 30  GLUCOSE 172* 128*  BUN 64* 62*  CREATININE 2.20* 1.85*    Imaging: No results found. Personally viewed.   Telemetry: No VT Personally viewed.   Assessment/Plan:   81 year old with stage D chronic systolic heart failure here with acute kidney  injury, creatinine rise from 1.5 up to 2.38 on admit. On home milrinone. DO NOT RESUSCITATE.  1. Chronic systolic heart failure-ejection fraction has been 20%, status post Medtronic CRT/D. - Renal function improving off diuretics -Not on beta blocker with history of low output and milrinone -Not on ACE inhibitor due to rash or spiro due to CKD.  - At some point will need spiro as K often runs low.   -2. Paroxysmal atrial fibrillation/flutter-converted back to normal sinus rhythm 12/27/2013. Has had recurrent atrial flutter recently. Paroxysmal. Was back in sinus rhythm on 10/23/14. Amiodarone recently increased to 200 mg twice a day. Continue with this dose.INR 2.0   3. Pulmonary arterial hypertension-continue with revatio  4. Acute on Chronic renal failure - improved with holding diuretics  5. Hypokalemia- Today K 4. 9    6. DO NOT RESUSCITATE.   CLEGG,[name] NP-C  10/29/2014, 10:59 AM  Patient seen and examined with Darrick Grinder, NP. We discussed all aspects of the encounter. I agree with the assessment and plan as stated above.   Volume status remains low. Renal function improving off diuretics. Will likely restart tomorrow. Home tomorrow or Friday.   Krishang Reading,MD 6:55 PM

## 2014-10-29 NOTE — Progress Notes (Signed)
ANTICOAGULATION CONSULT NOTE - Follow Up Consult  Pharmacy Consult for Coumadin Indication: atrial fibrillation  Allergies  Allergen Reactions  . Lisinopril Rash    ( pt denies this allergy)    Patient Measurements: Height: 5\' 7"  (170.2 cm) Weight: 126 lb 9.6 oz (57.425 kg) (scale B) IBW/kg (Calculated) : 66.1  Vital Signs: Temp: 98.2 F (36.8 C) (05/11 0624) Temp Source: Oral (05/11 0624) BP: 83/46 mmHg (05/11 1004) Pulse Rate: 70 (05/11 1004)  Labs:  Recent Labs  10/27/14 0400 10/28/14 0255 10/29/14 0605  HGB  --   --  9.8*  HCT  --   --  30.1*  PLT  --   --  245  LABPROT 25.7* 25.8* 23.6*  INR 2.32* 2.33* 2.08*  CREATININE 2.25* 2.20* 1.85*    Estimated Creatinine Clearance: 26.3 mL/min (by C-G formula based on Cr of 1.85).  Assessment: 64 yom well known to heart failure team on coumadin pta for afib. INR on admit therapeutic and home dose continued. INR remains at goal. No bleeding reported.  Home dose: 2mg  daily except 4mg  on Monday  Goal of Therapy:  INR 2-3 Monitor platelets by anticoagulation protocol: Yes   Plan:  1) Coumadin 2 mg x 1 per home regimen 2) Daily INR  Uvaldo Rising, BCPS  Clinical Pharmacist Pager [phone]  10/29/2014 12:29 PM

## 2014-10-29 NOTE — Care Management Note (Signed)
Case Management Note  Patient Details  Name: Nickolas Chalfin MRN: [id number] Date of Birth: [birth year]  Subjective/Objective:      Pt admitted on 10/25/14 with CHF, AKI.  PTA, pt resided at home with spouse.  He is active with [facility] for home milrione and CHF management.                Action/Plan:  Will follow for discharge planning as pt progresses.   Expected Discharge Date:                  Expected Discharge Plan:  [place]  In-House Referral:     Discharge planning Services  CM Consult  Post Acute Care Choice:    Choice offered to:     DME Arranged:    DME Agency:     HH Arranged:    [place] Agency:     Status of Service:  In process, will continue to follow  Medicare Important Message Given:    Date Medicare IM Given:    Medicare IM give by:    Date Additional Medicare IM Given:    Additional Medicare Important Message give by:     If discussed at [place] of Stay Meetings, dates discussed:    Additional Comments:  Ella Bodo, RN 10/29/2014, 5:35 PM Phone [phone]

## 2014-10-30 LAB — BASIC METABOLIC PANEL
Anion gap: 12 (ref 5–15)
BUN: 66 mg/dL — AB (ref 6–20)
CHLORIDE: 82 mmol/L — AB (ref 101–111)
CO2: 28 mmol/L (ref 22–32)
Calcium: 8.6 mg/dL — ABNORMAL LOW (ref 8.9–10.3)
Creatinine, Ser: 1.82 mg/dL — ABNORMAL HIGH (ref 0.61–1.24)
GFR calc Af Amer: 39 mL/min — ABNORMAL LOW (ref >60)
GFR calc non Af Amer: 34 mL/min — ABNORMAL LOW (ref >60)
GLUCOSE: 178 mg/dL — AB (ref 65–99)
POTASSIUM: 4.4 mmol/L (ref 3.5–5.1)
Sodium: 122 mmol/L — ABNORMAL LOW (ref 135–145)

## 2014-10-30 LAB — PROTIME-INR
INR: 1.89 — ABNORMAL HIGH (ref 0.00–1.49)
PROTHROMBIN TIME: 21.9 s — AB (ref 11.6–15.2)

## 2014-10-30 MED ORDER — MILRINONE IN DEXTROSE 20 MG/100ML IV SOLN: 0.5000 ug/kg/min | INTRAVENOUS | Status: AC

## 2014-10-30 MED ORDER — WARFARIN SODIUM 4 MG PO TABS
4.0000 mg | ORAL_TABLET | Freq: Once | ORAL | Status: AC
  Administered 2014-10-30: 4 mg via ORAL
  Filled 2014-10-30: qty 1

## 2014-10-30 MED ORDER — TORSEMIDE 20 MG PO TABS: 80.0000 mg | ORAL_TABLET | Freq: Two times a day (BID) | ORAL | Status: AC

## 2014-10-30 MED ORDER — FUROSEMIDE 10 MG/ML IJ SOLN
80.0000 mg | Freq: Once | INTRAMUSCULAR | Status: AC
Start: 2014-10-30 — End: 2014-10-30
  Administered 2014-10-30: 80 mg via INTRAVENOUS
  Filled 2014-10-30: qty 8

## 2014-10-30 NOTE — Progress Notes (Signed)
ANTICOAGULATION CONSULT NOTE - Follow Up Consult  Pharmacy Consult for Coumadin Indication: Atrial Fibrillation  Allergies  Allergen Reactions  . Lisinopril Rash    ( pt denies this allergy)    Patient Measurements: Height: 5\' 7"  (170.2 cm) Weight: 129 lb 10.1 oz (58.8 kg) IBW/kg (Calculated) : 66.1   Vital Signs: Temp: 97.3 F (36.3 C) (05/12 0552) Temp Source: Oral (05/12 0552) BP: 94/46 mmHg (05/12 0552) Pulse Rate: 71 (05/12 0552)  Labs:  Recent Labs  10/28/14 0255 10/29/14 0605 10/30/14 0455  HGB  --  9.8*  --   HCT  --  30.1*  --   PLT  --  245  --   LABPROT 25.8* 23.6* 21.9*  INR 2.33* 2.08* 1.89*  CREATININE 2.20* 1.85*  --     Estimated Creatinine Clearance: 26.9 mL/min (by C-G formula based on Cr of 1.85).   Medications:  Scheduled:  . amiodarone  200 mg Oral BID  . feeding supplement (ENSURE ENLIVE)  237 mL Oral BID BM  . magnesium oxide  400 mg Oral BID  . metoCLOPramide  10 mg Oral BID  . multivitamin with minerals  1 tablet Oral Daily  . polyethylene glycol  17 g Oral Daily  . sildenafil  20 mg Oral TID  . sodium chloride  3 mL Intravenous Q12H  . sorbitol  30 mL Oral Daily  . Warfarin - Pharmacist Dosing Inpatient   Does not apply q1800    Assessment: Patient is a 81 year old admitted for atrial fibrillation.  His INR has trended down on doses consistent with his home dose, and is low at 1.89. No bleeding or complications noted.  Home dose: 2mg  daily except 4mg  on Monday  Goal of Therapy:  INR 2-3 Monitor platelets by anticoagulation protocol: Yes   Plan:  Coumadin 4 mg po x 1 dose Monitor INR daily Monitor for signs and symptoms of bleeding  Modena Slater, PharmD Candidate 10/30/2014,11:28 AM   Uvaldo Rising, BCPS  Clinical Pharmacist Pager [phone]  10/30/2014 12:52 PM

## 2014-10-30 NOTE — Discharge Summary (Signed)
Advanced Heart Failure Team  Discharge Summary   Patient ID: Tyler Christensen MRN: [id number], DOB/AGE: 79-05-1933 79 y.o. Admit date: 10/25/2014 D/C date:     10/30/2014   Primary Discharge Diagnoses:  1. Chronic systolic heart failure-ejection fraction has been 20%, status post Medtronic CRT/D. 2. Paroxysmal atrial fibrillation/flutter-converted back to normal sinus rhythm 12/27/2013. Has had recurrent atrial flutter recently. Paroxysmal. Was back in sinus rhythm on 10/23/14. Amiodarone recently increased to 200 mg twice a day. Continue with this dose. On chronic coumadin.  3. Pulmonary arterial hypertension-continue with revatio 4. Acute on Chronic renal failure  5. Hypokalemia-->resolved.   6. DO NOT RESUSCITATE  Hospital Course:  Tyler Christensen is a 81 y.o. [name] (non-english speaking) w/ PMHx significant for chronic systolic CHF (EF 30%), NICM, LBBB, PAF (on coumadin), HTN, HLD, CVA, Pulmonary Hypertension and h/o seizure d/o (2/2 CVA 05/2012). On chronic milrinone at 0.5 mcg.  Admitted with chest pain and worsening renal function. He remains end stage heart failure and has ongoing HF symptoms. Metolazone and spironolactone held but later restarted as his renal function improved. After renal function improved diuretics restarted. Prior to discharge he was given one dose of IV lasix. He will continue on milrinone 0.5 mcg provided by [facility]. [facility] will continue to follow for weekly labs.  He remained in NSR and will continue amio 200 mg twice a day. He will continue coumadin. Potassium was followed closely and remained stable while hospitalized. Arlyce Harman was resumed with supplemental potassium.   He will continue to be followed closely in the HF clinic and has follow up next week. He remains a DNR.    Discharge Weight Range: 129 pounds.  Discharge Vitals: Blood pressure 102/63, pulse 70, temperature 97.1 F (36.2 C), temperature source Axillary, resp. rate 18, height 5\' 7"  (1.702 m), weight 129 lb 10.1  oz (58.8 kg), SpO2 94 %.  Labs: Lab Results  Component Value Date   WBC 13.5* 10/29/2014   HGB 9.8* 10/29/2014   HCT 30.1* 10/29/2014   MCV 75.6* 10/29/2014   PLT 245 10/29/2014    Recent Labs Lab 10/30/14 1035  NA 122*  K 4.4  CL 82*  CO2 28  BUN 66*  CREATININE 1.82*  CALCIUM 8.6*  GLUCOSE 178*   Lab Results  Component Value Date   CHOL 128 06/12/2012   HDL 27* 06/12/2012   LDLCALC 78 06/12/2012   TRIG 113 06/12/2012   BNP (last 3 results)  Recent Labs  09/16/14 0250 09/21/14 0220 10/25/14 0409  BNP 3495.2* 2515.7* 1882.5*    ProBNP (last 3 results)  Recent Labs  03/01/14 1959 03/08/14 2230 06/05/14 0332  PROBNP 11880.0* 15334.0* 19533.0*     Diagnostic Studies/Procedures   No results found.  Discharge Medications     Medication List    STOP taking these medications        furosemide 40 MG tablet  Commonly known as:  LASIX     Oxycodone HCl 10 MG Tabs      TAKE these medications        acetaminophen 325 MG tablet  Commonly known as:  TYLENOL  Take 2 tablets (650 mg total) by mouth every 6 (six) hours as needed for mild pain (or Fever >/= 101).     amiodarone 200 MG tablet  Commonly known as:  PACERONE  Take 1 tablet (200 mg total) by mouth 2 (two) times daily.     atorvastatin 20 MG tablet  Commonly known as:  LIPITOR  Take 20 mg by mouth daily.     LORazepam 1 MG tablet  Commonly known as:  ATIVAN  Take 1 tablet (1 mg total) by mouth at bedtime as needed for anxiety.     magnesium oxide 400 MG tablet  Commonly known as:  MAG-OX  Take 1 tablet (400 mg total) by mouth 2 (two) times daily.     metoCLOPramide 10 MG tablet  Commonly known as:  REGLAN  Take 1 tablet (10 mg total) by mouth 2 (two) times daily.     metolazone 2.5 MG tablet  Commonly known as:  ZAROXOLYN  Tomar una tableta (2.5 mg) Lunes y Jueves     milrinone 20 MG/100ML Soln infusion  Commonly known as:  PRIMACOR  Inject 29.4 mcg/min into the vein  continuous.     polyethylene glycol packet  Commonly known as:  MIRALAX / GLYCOLAX  Take 17 g by mouth daily.     potassium chloride SA 20 MEQ tablet  Commonly known as:  K-DUR,KLOR-CON  Take 2 tablets (40 mEq total) by mouth 2 (two) times daily.     senna-docusate 8.6-50 MG per tablet  Commonly known as:  Senokot-S  Take 2 tablets by mouth 2 (two) times daily.     sildenafil 20 MG tablet  Commonly known as:  REVATIO  Take 1 tablet (20 mg total) by mouth 3 (three) times daily.     sorbitol 70 % Soln  Take 30 mLs by mouth daily.     spironolactone 25 MG tablet  Commonly known as:  ALDACTONE  Take 1 tablet (25 mg total) by mouth 2 (two) times daily.     torsemide 20 MG tablet  Commonly known as:  DEMADEX  Take 4 tablets (80 mg total) by mouth 2 (two) times daily. Replace Monday Wednesday and Friday morning demadex with 80mg  IV lasix. [facility] to manage  Start taking on:  10/31/2014     traMADol 50 MG tablet  Commonly known as:  ULTRAM  Take 50 mg by mouth 2 (two) times daily as needed for moderate pain.     warfarin 4 MG tablet  Commonly known as:  COUMADIN  Take 1 tablet (4 mg total) by mouth daily at 6 PM. Take 4 mg Monday, Wednesday, Friday and Sunday. 2 mg on Tues, Thurs and Sat        Disposition   The patient will be discharged in stable condition to home.     Discharge Instructions    Contraindication to ACEI at discharge    Complete by:  As directed      Diet - low sodium heart healthy    Complete by:  As directed      Heart Failure patients record your daily weight using the same scale at the same time of day    Complete by:  As directed      Increase activity slowly    Complete by:  As directed           Follow-up Information    Follow up with Glori Bickers, MD On 11/06/2014.   Specialty:  Cardiology   Why:  at 3:20pm in the Advanced heart Failure clinic--gate code [id number]--please bring all medications   Contact information:   [place] [address] [phone]         Duration of Discharge Encounter: Greater than 35 minutes   Signed, CLEGG,[name] NP-C  10/30/2014, 2:12 PM   Patient seen and examined  with Darrick Grinder, NP. We discussed all aspects of the encounter. I agree with the assessment and plan as stated above. He is improved, though still with very end-stage HF. Continues to refuse Hospice. Will follow closely with [facility] and in HF Clinic.   Bensimhon, Daniel,MD 11:45 PM

## 2014-10-30 NOTE — Progress Notes (Signed)
    Subjective:   Feels fine. Wants to go home. No dyspnea or ab pain.  Weight up 3 pounds  Remains on milrinone 0.5. Co-ox 51%   INR 2.08   Objective:   . amiodarone  200 mg Oral BID  . feeding supplement (ENSURE ENLIVE)  237 mL Oral BID BM  . magnesium oxide  400 mg Oral BID  . metoCLOPramide  10 mg Oral BID  . multivitamin with minerals  1 tablet Oral Daily  . polyethylene glycol  17 g Oral Daily  . sildenafil  20 mg Oral TID  . sodium chloride  3 mL Intravenous Q12H  . sorbitol  30 mL Oral Daily  . Warfarin - Pharmacist Dosing Inpatient   Does not apply q1800  Vital Signs in the last 24 hours: Temp:  [97.3 F (36.3 C)-98 F (36.7 C)] 97.3 F (36.3 C) (05/12 0552) Pulse Rate:  [68-74] 71 (05/12 0552) Resp:  [16-18] 16 (05/12 0552) BP: (83-94)/(37-56) 94/46 mmHg (05/12 0552) SpO2:  [90 %-96 %] 93 % (05/12 0552) Weight:  [129 lb 10.1 oz (58.8 kg)] 129 lb 10.1 oz (58.8 kg) (05/12 0552)  Intake/Output from previous day: 05/11 0701 - 05/12 0700 In: 6734 [P.O.:1300; I.V.:274] Out: 1200 [Urine:1200]   Physical Exam: General: Thin, fragile, in no acute distress. Sitting on the side of the bed.  Head:  Normocephalic and atraumatic. Lungs: Clear to auscultation and percussion. Heart: Normal S1 and S2.  +S3 No murmur, rubs or gallops. JVP 9 Abdomen: soft, non-tender, positive bowel sounds. Extremities: No clubbing or cyanosis. No edema. Sacral bandage Neurologic: Alert and oriented x 3.    Lab Results:  Recent Labs  10/29/14 0605  WBC 13.5*  HGB 9.8*  PLT 245    Recent Labs  10/28/14 0255 10/29/14 0605  NA 120* 122*  K 3.9 4.9  CL 79* 81*  CO2 28 30  GLUCOSE 172* 128*  BUN 64* 62*  CREATININE 2.20* 1.85*    Imaging: No results found. Personally viewed.   Telemetry: No VT Personally viewed.   Assessment/Plan:   81 year old with stage D chronic systolic heart failure here with acute kidney injury, creatinine rise from 1.5 up to 2.38 on admit. On  home milrinone. DO NOT RESUSCITATE.  1. Chronic systolic heart failure-ejection fraction has been 20%, status post Medtronic CRT/D. - Renal function improving off diuretics -Not on beta blocker with history of low output and milrinone -Not on ACE inhibitor due to rash or spiro due to CKD.  - At some point will need spiro as K often runs low.   -2. Paroxysmal atrial fibrillation/flutter-converted back to normal sinus rhythm 12/27/2013. Has had recurrent atrial flutter recently. Paroxysmal. Was back in sinus rhythm on 10/23/14. Amiodarone recently increased to 200 mg twice a day. Continue with this dose.INR 1.89    3. Pulmonary arterial hypertension-continue with revatio  4. Acute on Chronic renal failure - improved with holding diuretics  5. Hypokalemia- Today K 4. 9    6. DO NOT RESUSCITATE.   CLEGG,[name] NP-C  10/30/2014, 8:54 AM  Patient seen and examined with Darrick Grinder, NP. We discussed all aspects of the encounter. I agree with the assessment and plan as stated above.   Feeling better. Anxious to go home. Will give one dose IV lasix and discharge home later today.   Ailea Rhatigan,MD 2:13 PM

## 2014-10-31 ENCOUNTER — Encounter: Payer: Self-pay | Admitting: *Deleted

## 2014-11-03 ENCOUNTER — Ambulatory Visit (INDEPENDENT_AMBULATORY_CARE_PROVIDER_SITE_OTHER): Payer: Medicare Other | Admitting: Cardiovascular Disease

## 2014-11-03 DIAGNOSIS — I4891 Unspecified atrial fibrillation: Secondary | ICD-10-CM

## 2014-11-03 LAB — POCT INR: INR: 1.9

## 2014-11-05 ENCOUNTER — Telehealth (HOSPITAL_COMMUNITY): Payer: Self-pay

## 2014-11-05 NOTE — Telephone Encounter (Signed)
Patient's nurse left VM stating patient's BP was 72/42, that he was too weak to stand to weigh today, and was sleeping a lot today.  Nurse held schedule dose of 120mg  IV lasix.  Patient denies dizziness, chest Christensen, or shortness of breath.  NO edema noted.  Patient has an appointment with [name] tomorrow.  Tyler Christensen

## 2014-11-06 ENCOUNTER — Ambulatory Visit (HOSPITAL_COMMUNITY)
Admit: 2014-11-06 | Discharge: 2014-11-06 | Disposition: A | Discharge status: Home / Self Care | Payer: Medicare Other | Source: Ambulatory Visit | Attending: Internal Medicine | Admitting: Internal Medicine

## 2014-11-06 VITALS — BP 74/44 | HR 87 | Resp 22 | Wt 123.0 lb

## 2014-11-06 DIAGNOSIS — I5022 Chronic systolic (congestive) heart failure: Secondary | ICD-10-CM | POA: Insufficient documentation

## 2014-11-06 DIAGNOSIS — I480 Paroxysmal atrial fibrillation: Secondary | ICD-10-CM | POA: Insufficient documentation

## 2014-11-06 DIAGNOSIS — Z95810 Presence of automatic (implantable) cardiac defibrillator: Secondary | ICD-10-CM | POA: Diagnosis not present

## 2014-11-06 DIAGNOSIS — I272 Other secondary pulmonary hypertension: Secondary | ICD-10-CM | POA: Diagnosis not present

## 2014-11-06 MED ORDER — AMIODARONE HCL 200 MG PO TABS: 200.0000 mg | ORAL_TABLET | Freq: Every day | ORAL | Status: AC

## 2014-11-06 NOTE — Progress Notes (Signed)
Patient ID: Tyler Christensen, male   DOB: [birth year], 81 y.o.   MRN: [id number]  [employer]  HPI: Tyler Christensen is a 81 y.o. [name] (non-english speaking) w/ PMHx significant for chronic systolic CHF (EF 98%), NICM, LBBB, PAF (on coumadin), HTN, HLD, CVA, Pulmonary Hypertension and h/o seizure d/o (2/2 CVA 05/2012).   Admitted to [facility] 06/2012 with acute decompensated heart failure EF 20%. As noted below, also diagnosed with severe pulmonary hypertension (PAPs ~ 90) from initial [facility]HC and placed on Milrinone and Revatio (eventually transitioned to tadalafil 40) with marked clinical response and decrease in PA pressures. VQ low probability for pulmonary embolus.  Discharged on Milrinone at 0.375 mcg via PICC. Discharge Weight 146 pounds. Attempted to wean milrinone in 9/14 but failed due to worsening HF and PAH. Milrinone restarted.  Admitted to the hospital 5/21-11/22/13. Found to have staph aureus bacteremia treated with IV abx. TEE negative for endocarditis. Also found to retroperitoneal mass and biopsy revealed benign lesion. He had evidence of low output thought to be due to atrial fibrillation and milrinone increased. Attempted TEE and DC-CV but had LAA clot so deferred cardioversion.  He was admitted in 12/15 with acute on chronic systolic CHF.  He was diuresed with IV Lasix and milrinone was increased to 0.5 mcg/kg/min.  Over past 2 months has had numerous admits for HF. At last visit, we finally convinced him to go [facility] but he left within a week. Now back home.  Here for f/u: Remains on milrinone 0.5 mcg/kg/min.  Scheduled to get IV lasix M/W/F with [facility]. Appetite ok. Having low blood pressure at home.  . Denies drinking water or ice but wife says he is drinking a lot. Taking medications. Refuses Hospice.   NO ACE-I 2/2 rash   08/14/12 S/P successful DC-CV of AF 10/26/12 S/P BiV-ICD implant 01/07/13 ECHO EF 20%, restrictive diastolic function, mildly decreased RV size and systolic fxn, mild to moderate  MR, PA systolic pressure 59 mmHg  02/21/13 ECHO EF 20% PA pressure mean 59 11/12/13: ECHO EF 20-25%, trivial AI,  07/07/14: ECHO EF 15% moderate RV dysfunction PAP 85  Labs      (06/25/13) K 4.2 Creatinine 1.58 Magnesium 2.0     (07/16/13) K 4.2, Cr 1.67    (2/15) creatinine 1.97 => 1.94, K 4.6, HCT 39.4    (4/15) K 4.5, creatinine 1.56, pro-BNP 6912    (4/15) K 3.9, creatinine 2.39, mag 2.1     (09/2013) K 3.7, creatinine 2.03     11/26/13 K 4.6 cr 2.8 hgb 13.2     12/11/13 K 3.4 Creatinine 2.14  Magnesium 2.1     12/19/13: K+ 3.4, creatinine 2.42, BUN 57     01/14/14: K 3.8, creatinine 2.45, BUN 45, mag 2.1    12/15 K 3.6, creatinine 1.68, HCT 34.4     07/17/14: K 2.4 creatinine 1.51      08/22/14: K 3.4 creatinine  1.46     10/07/14: K 5.6 creatinine  1.51 Na 124  SH: Lives with his wife here in [place].   ROS: All systems negative except as listed in HPI, PMH and Problem List.  Past Medical History  Diagnosis Date  . HTN (hypertension)   . High cholesterol   . CHF (congestive heart failure)   . BBBB (bilateral bundle branch block)   . LBBB (left bundle branch block)   . Seizures 06/11/2012    new onset/notes (06/11/2012)  . SOB (shortness of breath)     "  sometimes when I lay down;; related to not taking my RX" (06/11/2012)  . Myocardial infarction     06/10/2012  . Atrial fibrillation   . Stroke   . Atrial thrombus     left  . Pulmonary HTN   . Atrial fibrillation 06/12/2012  . Coronary artery disease     Current Outpatient Prescriptions  Medication Sig Dispense Refill  . amiodarone (PACERONE) 200 MG tablet Take 1 tablet (200 mg total) by mouth 2 (two) times daily. 60 tablet 3  . LORazepam (ATIVAN) 1 MG tablet Take 1 tablet (1 mg total) by mouth at bedtime as needed for anxiety. (Patient taking differently: Take 1 mg by mouth daily. ) 30 tablet 0  . magnesium oxide (MAG-OX) 400 MG tablet Take 1 tablet (400 mg total) by mouth 2 (two) times daily. 60 tablet 3  . metoCLOPramide  (REGLAN) 10 MG tablet Take 1 tablet (10 mg total) by mouth 2 (two) times daily. 60 tablet 3  . metolazone (ZAROXOLYN) 2.5 MG tablet Tomar una tableta (2.5 mg) Lunes y [place] (Patient taking differently: Take 2.5 mg by mouth 2 (two) times a week. Tomar una tableta (2.5 mg) Lunes y [place] (Monday and Thursday)) 60 tablet 3  . milrinone (PRIMACOR) 20 MG/100ML SOLN infusion Inject 29.4 mcg/min into the vein continuous. 100 mL 5  . polyethylene glycol (MIRALAX / GLYCOLAX) packet Take 17 g by mouth daily. 14 each 6  . potassium chloride SA (K-DUR,KLOR-CON) 20 MEQ tablet Take 2 tablets (40 mEq total) by mouth 2 (two) times daily. 60 tablet 3  . senna-docusate (SENOKOT-S) 8.6-50 MG per tablet Take 2 tablets by mouth 2 (two) times daily.    . sildenafil (REVATIO) 20 MG tablet Take 1 tablet (20 mg total) by mouth 3 (three) times daily. 90 tablet 3  . sorbitol 70 % SOLN Take 30 mLs by mouth daily. (Patient taking differently: Take 30 mLs by mouth daily as needed (constipation). ) 500 mL 3  . spironolactone (ALDACTONE) 25 MG tablet Take 1 tablet (25 mg total) by mouth 2 (two) times daily. 60 tablet 5  . torsemide (DEMADEX) 20 MG tablet Take 4 tablets (80 mg total) by mouth 2 (two) times daily. Replace Monday Wednesday and Friday morning demadex with 80mg  IV lasix. [facility] to manage 240 tablet 3  . warfarin (COUMADIN) 4 MG tablet Take 1 tablet (4 mg total) by mouth daily at 6 PM. Take 4 mg Monday, Wednesday, Friday and Sunday. 2 mg on Tues, Thurs and Sat (Patient taking differently: Take 2-4 mg by mouth daily at 6 PM. Take 1 tablet (4 mg) on Monday, take 1/2 tablet (2 mg) on Sunday, Tuesday, Wednesday, Thursday, Friday, Saturday (per anti-coag visit 10/20/14)) 45 tablet 3  . acetaminophen (TYLENOL) 325 MG tablet Take 2 tablets (650 mg total) by mouth every 6 (six) hours as needed for mild pain (or Fever >/= 101). (Patient not taking: Reported on 11/06/2014)    . atorvastatin (LIPITOR) 20 MG tablet Take 20 mg by mouth daily.     . traMADol (ULTRAM) 50 MG tablet Take 50 mg by mouth 2 (two) times daily as needed for moderate pain.   0   No current facility-administered medications for this encounter.    Filed Vitals:   11/06/14 1527  BP: 74/44  Pulse: 87  Resp: 22  Weight: 123 lb (55.792 kg)  SpO2: 98%   PHYSICAL EXAM: General:  Elderly, NAD.No resp difficulty; wife and interpreter present Tyler Christensen) Holding cup of ice HEENT:  normal Neck: supple. JVP 9; Carotids 2+ bilaterally; no bruits. No lymphadenopathy or thryomegaly appreciated.  Cor: PMI laterally displaced. Regular rhythm and rate. + s3 or murmur.  Lungs: clear Abdomen: soft, nontender, mildy distended. No hepatosplenomegaly. No bruits or masses. Good bowel sounds. Extremities: no cyanosis, clubbing, rash. 2+ edema .   Neuro: alert & orientedx3, cranial nerves grossly intact. Moves all 4 extremities w/o difficulty. Affect pleasant.   ASSESSMENT & PLAN:  1. Chronic systolic CHF: NICM, EF 48% s/p Medtronic CRT-D; remains on milrinone 0.5 mcg/kg/min. Remains NYHA IIIb with multiple recent hospitalizations in setting of dietary and medication noncompliance. Volume status up.  He is not interested in palliative care or hospice at this time. He is a DNR.  - Mildly overloaded today in the setting of recent flutter - Continue lasix 80 IV M/W/F with [facility]. Got extra dose today. Continue zaroxolyn 2.5 on Monday and Friday as needed. Stressed need for fluid restriction. - Continue milrinone 0.5 mcg through PICC and will get weekly labs.   - Not on BB with history of low output and milrinone.  - He is not on ACE-I due to rash and no longer on ARB or spironolactone with renal function. - Continue current spironolactone to help with hypokalemia  - Discussed the importance of daily weights, a low sodium diet, and fluid restriction (less than 2 L a day). Instructed to call the HF clinic if weight increases more than 3 lbs overnight or 5 lbs in a week.  2.  Paroxysmal atrial fibrillation/flutter: Cardioverted back to NSR 12/27/13. Has recurrent AFL over past 2 days. Now back in NSR. Increase amio to 200 bid for now.  3. Pulmonary arterial hypertension:  Continue revatio 20 mg TID 40 mg and milrinone.  4. CKD stage III: Stable. Continue to follow.  5. Abdominal discomfort/distension: This persists. Will start reglan 10 mg bid. Get gastric emptying study and refer to GI.  6. Hypokalemia: Now he is apparently taking his potassium (was throwing away before) K up. Recheck BMET next week.  Tyler Christensen,[name],NP-C  11/06/2014  Patient seen and examined with Darrick Grinder, NP. We discussed all aspects of the encounter. I agree with the assessment and plan as stated above.   He continues to struggle with end-stage HF. Volume status mildly up in the setting of recent atrial tachycardia/flutter which is now resolved. Continue home IV lasix.   Bensimhon, Daniel,MD 10:10 PM

## 2014-11-06 NOTE — Patient Instructions (Signed)
STOP Metolazone.  STOP Sildenafil.  DECREASE Amiodarone una vez al da.  DECREASE IV Lasix dos veces por semana los lunes y viernes.  Seguimiento 2 semanas.

## 2014-11-07 ENCOUNTER — Telehealth (HOSPITAL_COMMUNITY): Payer: Self-pay | Admitting: Cardiology

## 2014-11-07 NOTE — Telephone Encounter (Signed)
[name],RN with [facility] called with concerns regarding pts b/p B/p 70/40 Weight today 121 lbs Pt was able to stand for weight "stable"  Pt is not in any distress/ no increased SOB, no swelling Recent orders for IV lasix changed to Mon/Fri   Per vo Dr.Bensimhon Ok to hold IV lasix today  [name] aware

## 2014-11-10 ENCOUNTER — Ambulatory Visit (INDEPENDENT_AMBULATORY_CARE_PROVIDER_SITE_OTHER): Payer: Medicare Other | Admitting: Interventional Cardiology

## 2014-11-10 ENCOUNTER — Telehealth (HOSPITAL_COMMUNITY): Payer: Self-pay | Admitting: *Deleted

## 2014-11-10 DIAGNOSIS — I4891 Unspecified atrial fibrillation: Secondary | ICD-10-CM

## 2014-11-10 LAB — POCT INR: INR: 3

## 2014-11-10 NOTE — Telephone Encounter (Signed)
Amiee, RN is out to see pt for IV lasix today and called to report BP 70/40 and she is holding Lasix today , she will see pt again on Wed to evaluate.  She states wt is 124 today, was 121 on Fri, she states abd girth is the same and ankles are very small and have no edema, pt denies dizziness.  Dr Haroldine Laws aware and states ok to hold IV Lasix today, no other changes.

## 2014-11-12 ENCOUNTER — Encounter (HOSPITAL_COMMUNITY): Payer: Self-pay | Admitting: Emergency Medicine

## 2014-11-12 ENCOUNTER — Telehealth (HOSPITAL_COMMUNITY): Payer: Self-pay | Admitting: *Deleted

## 2014-11-12 ENCOUNTER — Emergency Department (HOSPITAL_COMMUNITY): Payer: Medicare Other

## 2014-11-12 ENCOUNTER — Emergency Department (HOSPITAL_COMMUNITY)
Admission: EM | Admit: 2014-11-12 | Discharge: 2014-11-12 | Disposition: A | Discharge status: Home / Self Care | Payer: Medicare Other | Attending: Emergency Medicine | Admitting: Emergency Medicine

## 2014-11-12 DIAGNOSIS — Y92009 Unspecified place in unspecified non-institutional (private) residence as the place of occurrence of the external cause: Secondary | ICD-10-CM | POA: Insufficient documentation

## 2014-11-12 DIAGNOSIS — Z9889 Other specified postprocedural states: Secondary | ICD-10-CM | POA: Insufficient documentation

## 2014-11-12 DIAGNOSIS — Y998 Other external cause status: Secondary | ICD-10-CM | POA: Insufficient documentation

## 2014-11-12 DIAGNOSIS — Z79899 Other long term (current) drug therapy: Secondary | ICD-10-CM | POA: Diagnosis not present

## 2014-11-12 DIAGNOSIS — Y9389 Activity, other specified: Secondary | ICD-10-CM | POA: Diagnosis not present

## 2014-11-12 DIAGNOSIS — I4891 Unspecified atrial fibrillation: Secondary | ICD-10-CM | POA: Diagnosis not present

## 2014-11-12 DIAGNOSIS — W19XXXA Unspecified fall, initial encounter: Secondary | ICD-10-CM

## 2014-11-12 DIAGNOSIS — W01190A Fall on same level from slipping, tripping and stumbling with subsequent striking against furniture, initial encounter: Secondary | ICD-10-CM | POA: Insufficient documentation

## 2014-11-12 DIAGNOSIS — Z8673 Personal history of transient ischemic attack (TIA), and cerebral infarction without residual deficits: Secondary | ICD-10-CM | POA: Insufficient documentation

## 2014-11-12 DIAGNOSIS — S0990XA Unspecified injury of head, initial encounter: Secondary | ICD-10-CM | POA: Diagnosis present

## 2014-11-12 DIAGNOSIS — I252 Old myocardial infarction: Secondary | ICD-10-CM | POA: Insufficient documentation

## 2014-11-12 DIAGNOSIS — I10 Essential (primary) hypertension: Secondary | ICD-10-CM | POA: Diagnosis not present

## 2014-11-12 DIAGNOSIS — I2510 Atherosclerotic heart disease of native coronary artery without angina pectoris: Secondary | ICD-10-CM | POA: Insufficient documentation

## 2014-11-12 DIAGNOSIS — E780 Pure hypercholesterolemia: Secondary | ICD-10-CM | POA: Diagnosis not present

## 2014-11-12 DIAGNOSIS — I509 Heart failure, unspecified: Secondary | ICD-10-CM | POA: Insufficient documentation

## 2014-11-12 LAB — CBC WITH DIFFERENTIAL/PLATELET
BASOS ABS: 0 10*3/uL (ref 0.0–0.1)
BASOS PCT: 0 % (ref 0–1)
Eosinophils Absolute: 0 10*3/uL (ref 0.0–0.7)
Eosinophils Relative: 0 % (ref 0–5)
HCT: 31.5 % — ABNORMAL LOW (ref 39.0–52.0)
HEMOGLOBIN: 10.2 g/dL — AB (ref 13.0–17.0)
LYMPHS PCT: 5 % — AB (ref 12–46)
Lymphs Abs: 0.6 10*3/uL — ABNORMAL LOW (ref 0.7–4.0)
MCH: 24.1 pg — AB (ref 26.0–34.0)
MCHC: 32.4 g/dL (ref 30.0–36.0)
MCV: 74.5 fL — ABNORMAL LOW (ref 78.0–100.0)
MONOS PCT: 7 % (ref 3–12)
Monocytes Absolute: 0.9 10*3/uL (ref 0.1–1.0)
NEUTROS PCT: 88 % — AB (ref 43–77)
Neutro Abs: 11.4 10*3/uL — ABNORMAL HIGH (ref 1.7–7.7)
Platelets: 281 10*3/uL (ref 150–400)
RBC: 4.23 MIL/uL (ref 4.22–5.81)
RDW: 18.5 % — ABNORMAL HIGH (ref 11.5–15.5)
WBC: 12.9 10*3/uL — ABNORMAL HIGH (ref 4.0–10.5)

## 2014-11-12 LAB — BASIC METABOLIC PANEL
ANION GAP: 12 (ref 5–15)
BUN: 65 mg/dL — ABNORMAL HIGH (ref 6–20)
CHLORIDE: 89 mmol/L — AB (ref 101–111)
CO2: 21 mmol/L — AB (ref 22–32)
Calcium: 8.9 mg/dL (ref 8.9–10.3)
Creatinine, Ser: 2.42 mg/dL — ABNORMAL HIGH (ref 0.61–1.24)
GFR calc Af Amer: 27 mL/min — ABNORMAL LOW (ref >60)
GFR calc non Af Amer: 24 mL/min — ABNORMAL LOW (ref >60)
Glucose, Bld: 149 mg/dL — ABNORMAL HIGH (ref 65–99)
Potassium: 5.1 mmol/L (ref 3.5–5.1)
Sodium: 122 mmol/L — ABNORMAL LOW (ref 135–145)

## 2014-11-12 LAB — PROTIME-INR
INR: 3.99 — AB (ref 0.00–1.49)
PROTHROMBIN TIME: 37.9 s — AB (ref 11.6–15.2)

## 2014-11-12 NOTE — ED Notes (Signed)
Pt is on coumadin and hit head rt fall. Pt is also on a milranone drip that is currently infusing with no problems.

## 2014-11-12 NOTE — Discharge Instructions (Signed)
Follow up with your family md this week for recheck

## 2014-11-12 NOTE — ED Notes (Signed)
Pt here from home , pt tripped and fell hitting his head on a table . Pt has a abrasion to his left elbow and and the top of his head

## 2014-11-12 NOTE — ED Provider Notes (Signed)
CSN: [id number]     Arrival date & time 11/12/14  1009 History   First MD Initiated Contact with Patient 11/12/14 1017     Chief Complaint  Patient presents with  . Fall     (Consider location/radiation/quality/duration/timing/severity/associated sxs/prior Treatment) Patient is a 81 y.o. male presenting with fall. The history is provided by the patient (the pt tripped and hit his head.  ? loc).  Fall This is a new problem. The current episode started 1 to 2 hours ago. The problem occurs constantly. The problem has not changed since onset.Pertinent negatives include no chest pain, no abdominal pain and no headaches. Nothing aggravates the symptoms. Nothing relieves the symptoms.    Past Medical History  Diagnosis Date  . HTN (hypertension)   . High cholesterol   . CHF (congestive heart failure)   . BBBB (bilateral bundle branch block)   . LBBB (left bundle branch block)   . Seizures 06/11/2012    new onset/notes (06/11/2012)  . SOB (shortness of breath)     "sometimes when I lay down;; related to not taking my RX" (06/11/2012)  . Myocardial infarction     06/10/2012  . Atrial fibrillation   . Stroke   . Atrial thrombus     left  . Pulmonary HTN   . Atrial fibrillation 06/12/2012  . Coronary artery disease    Past Surgical History  Procedure Laterality Date  . Throat surgery  1940/10/04    "and nose" (06/11/2012); ?T&A  . Inguinal hernia repair  ~ 10-05-2006    "both sides" (06/11/2012)  . Tee without cardioversion  06/29/2012    Procedure: TRANSESOPHAGEAL ECHOCARDIOGRAM (TEE);  Surgeon: Jolaine Artist, MD;  Location: [facility] ENDOSCOPY;   . Cardioversion N/A 08/14/2012    Procedure: CARDIOVERSION;  Surgeon: Jolaine Artist, MD;  Location: [facility] ENDOSCOPY;  . Tee without cardioversion N/A 11/12/2013    Procedure: TRANSESOPHAGEAL ECHOCARDIOGRAM (TEE);  Surgeon: Candee Furbish, MD;  Location: [facility] ENDOSCOPY;   . Tee without cardioversion N/A 11/19/2013    Procedure: TRANSESOPHAGEAL  ECHOCARDIOGRAM (TEE);  Surgeon: Jolaine Artist, MD;  Location: [facility] ENDOSCOPY;   . Cardioversion N/A 12/27/2013    Procedure: CARDIOVERSION;  Surgeon: Jolaine Artist, MD;  Location: [facility] ENDOSCOPY;   . Left and right heart catheterization with coronary angiogram N/A 07/02/2012    Procedure: LEFT AND RIGHT HEART CATHETERIZATION WITH CORONARY ANGIOGRAM;  Surgeon: Jolaine Artist, MD;  Location: [place] CATH LAB; Mild non-obstructive CAD, severe PAH, PA = 93/42 (59), severe NICM w/ EF 15%  . Right heart catheterization N/A 07/06/2012    Procedure: RIGHT HEART CATH;  Surgeon: Jolaine Artist, MD; to assess response to milrinone; Moderate PH with profound reduction in PVR with milrinone/PDE-5 inhibitor   . Bi-ventricular implantable cardioverter defibrillator N/A 10/26/2012    Procedure: BI-VENTRICULAR IMPLANTABLE CARDIOVERTER DEFIBRILLATOR  (CRT-D);  Surgeon: Evans Lance, MD;  Medtronic Evera XT CRTD, BiV ICD serial #[id number]     Family History  Problem Relation Age of Onset  . Heart attack      father had MI in his 25s, no fhx of early heart problem before age 53  . Heart attack      mother died of MI around 1998-10-05   History  Substance Use Topics  . Smoking status: Never Smoker   . Smokeless tobacco: Never Used  . Alcohol Use: No    Review of Systems  Constitutional: Negative for appetite change and fatigue.  HENT: Negative for congestion, ear  discharge and sinus pressure.   Eyes: Negative for discharge.  Respiratory: Negative for cough.   Cardiovascular: Negative for chest pain.  Gastrointestinal: Negative for abdominal pain and diarrhea.  Genitourinary: Negative for frequency and hematuria.  Musculoskeletal: Negative for back pain.  Skin: Negative for rash.  Neurological: Negative for seizures and headaches.  Psychiatric/Behavioral: Negative for hallucinations.      Allergies  Lisinopril  Home Medications   Prior to Admission medications   Medication Sig Start Date End  Date Taking? Authorizing Provider  acetaminophen (TYLENOL) 325 MG tablet Take 2 tablets (650 mg total) by mouth every 6 (six) hours as needed for mild pain (or Fever >/= 101). 07/10/14  Yes Kinnie Feil, MD  amiodarone (PACERONE) 200 MG tablet Take 1 tablet (200 mg total) by mouth daily. 11/06/14  Yes Jolaine Artist, MD  atorvastatin (LIPITOR) 20 MG tablet Take 20 mg by mouth daily.   Yes Historical Provider, MD  LORazepam (ATIVAN) 1 MG tablet Take 1 tablet (1 mg total) by mouth at bedtime as needed for anxiety. Patient taking differently: Take 1 mg by mouth daily.  09/29/14  Yes [name] D Clegg, NP  magnesium oxide (MAG-OX) 400 MG tablet Take 1 tablet (400 mg total) by mouth 2 (two) times daily. 04/25/14  Yes Jolaine Artist, MD  metoCLOPramide (REGLAN) 10 MG tablet Take 1 tablet (10 mg total) by mouth 2 (two) times daily. 07/31/14  Yes Jolaine Artist, MD  metolazone (ZAROXOLYN) 2.5 MG tablet Take 2.5 mg by mouth. On Monday and Thursday 11/08/14  Yes Historical Provider, MD  milrinone (PRIMACOR) 20 MG/100ML SOLN infusion Inject 29.4 mcg/min into the vein continuous. 10/30/14  Yes [name] D Clegg, NP  polyethylene glycol (MIRALAX / GLYCOLAX) packet Take 17 g by mouth daily. 08/23/14  Yes Isaiah Serge, NP  potassium chloride SA (K-DUR,KLOR-CON) 20 MEQ tablet Take 2 tablets (40 mEq total) by mouth 2 (two) times daily. 10/09/14  Yes Shaune Pascal Bensimhon, MD  senna-docusate (SENOKOT-S) 8.6-50 MG per tablet Take 2 tablets by mouth 2 (two) times daily.   Yes Historical Provider, MD  sorbitol 70 % SOLN Take 30 mLs by mouth daily. Patient taking differently: Take 30 mLs by mouth daily as needed (constipation).  09/18/14  Yes Almyra Deforest, PA  spironolactone (ALDACTONE) 25 MG tablet Take 1 tablet (25 mg total) by mouth 2 (two) times daily. 09/18/14  Yes Almyra Deforest, PA  torsemide (DEMADEX) 20 MG tablet Take 4 tablets (80 mg total) by mouth 2 (two) times daily. Replace Monday Wednesday and Friday morning demadex with 80mg   IV lasix. [facility] to manage 10/31/14  Yes [name] D Clegg, NP  traMADol (ULTRAM) 50 MG tablet Take 50 mg by mouth 2 (two) times daily as needed for moderate pain.  07/22/14  Yes Historical Provider, MD  warfarin (COUMADIN) 4 MG tablet Take 1 tablet (4 mg total) by mouth daily at 6 PM. Take 4 mg Monday, Wednesday, Friday and Sunday. 2 mg on Tues, Thurs and Sat Patient taking differently: Take 2-4 mg by mouth daily at 6 PM. Take 1 tablet (4 mg) on Monday, take 1/2 tablet (2 mg) on Sunday, Tuesday, Wednesday, Thursday, Friday, Saturday (per anti-coag visit 10/20/14) 09/18/14  Yes Almyra Deforest, PA   BP 99/58 mmHg  Pulse 74  Resp 16  SpO2 91% Physical Exam  Constitutional: He is oriented to person, place, and time. He appears well-developed.  HENT:  Head: Normocephalic.  Mild tender forehead  Eyes: Conjunctivae and EOM  are normal. No scleral icterus.  Neck: Neck supple. No thyromegaly present.  Cardiovascular: Normal rate and regular rhythm.  Exam reveals no gallop and no friction rub.   No murmur heard. Pulmonary/Chest: No stridor. He has no wheezes. He has no rales. He exhibits no tenderness.  Abdominal: He exhibits no distension. There is no tenderness. There is no rebound.  Musculoskeletal: Normal range of motion. He exhibits no edema.  Lymphadenopathy:    He has no cervical adenopathy.  Neurological: He is oriented to person, place, and time. He exhibits normal muscle tone. Coordination normal.  Skin: No rash noted. No erythema.  Psychiatric: He has a normal mood and affect. His behavior is normal.    ED Course  Procedures (including critical care time) Labs Review Labs Reviewed  PROTIME-INR - Abnormal; Notable for the following:    Prothrombin Time 37.9 (*)    INR 3.99 (*)    All other components within normal limits  CBC WITH DIFFERENTIAL/PLATELET - Abnormal; Notable for the following:    WBC 12.9 (*)    Hemoglobin 10.2 (*)    HCT 31.5 (*)    MCV 74.5 (*)    MCH 24.1 (*)    RDW 18.5 (*)     Neutrophils Relative % 88 (*)    Neutro Abs 11.4 (*)    Lymphocytes Relative 5 (*)    Lymphs Abs 0.6 (*)    All other components within normal limits  BASIC METABOLIC PANEL - Abnormal; Notable for the following:    Sodium 122 (*)    Chloride 89 (*)    CO2 21 (*)    Glucose, Bld 149 (*)    BUN 65 (*)    Creatinine, Ser 2.42 (*)    GFR calc non Af Amer 24 (*)    GFR calc Af Amer 27 (*)    All other components within normal limits    Imaging Review Ct Head Wo Contrast  11/12/2014   CLINICAL DATA:  Recent fall with headache and neck pain.  EXAM: CT HEAD WITHOUT CONTRAST  CT CERVICAL SPINE WITHOUT CONTRAST  TECHNIQUE: Multidetector CT imaging of the head and cervical spine was performed following the standard protocol without intravenous contrast. Multiplanar CT image reconstructions of the cervical spine were also generated.  COMPARISON:  09/25/2014  FINDINGS: CT HEAD FINDINGS  The ventricles and cisterns are within normal. There is mild prominence of the CSF spaces compatible with age related atrophic change. There is minimal chronic ischemic microvascular disease. There is mild basal ganglia calcifications. Suggestion of an old lacune infarct over the right external capsule. No focal mass, mass effect, shift of midline structures or acute hemorrhage. No evidence of acute infarction. Remaining bones and soft tissues are within normal.  CT CERVICAL SPINE FINDINGS  Exam demonstrates subtle reversal the normal cervical lordosis. There is mild to moderate spondylosis of the cervical spine with disc space narrowing at the C5-6 and C6-7 levels unchanged. Prevertebral soft tissues are within normal. The atlantoaxial articulation is within normal. There is uncovertebral joint spurring and facet arthropathy as well as multilevel neural foraminal narrowing. There is no acute fracture or subluxation. Non fusion of the midline posterior arch of C1.  Subtle emphysematous disease over the lung apices. 8 mm  hypodense nodule over the right lobe of the thyroid unchanged.  IMPRESSION: No acute intracranial findings.  Age related atrophic change and chronic ischemic microvascular disease.  No acute cervical spine injury.  Mild to moderate spondylosis of the cervical  spine with disc disease at the C5-6 and C6-7 levels. Multilevel neural foraminal narrowing unchanged.  8 mm right thyroid hypodense nodule unchanged.   Electronically Signed   By: Marin Olp M.D.   On: 11/12/2014 11:49   Ct Cervical Spine Wo Contrast  11/12/2014   CLINICAL DATA:  Recent fall with headache and neck pain.  EXAM: CT HEAD WITHOUT CONTRAST  CT CERVICAL SPINE WITHOUT CONTRAST  TECHNIQUE: Multidetector CT imaging of the head and cervical spine was performed following the standard protocol without intravenous contrast. Multiplanar CT image reconstructions of the cervical spine were also generated.  COMPARISON:  09/25/2014  FINDINGS: CT HEAD FINDINGS  The ventricles and cisterns are within normal. There is mild prominence of the CSF spaces compatible with age related atrophic change. There is minimal chronic ischemic microvascular disease. There is mild basal ganglia calcifications. Suggestion of an old lacune infarct over the right external capsule. No focal mass, mass effect, shift of midline structures or acute hemorrhage. No evidence of acute infarction. Remaining bones and soft tissues are within normal.  CT CERVICAL SPINE FINDINGS  Exam demonstrates subtle reversal the normal cervical lordosis. There is mild to moderate spondylosis of the cervical spine with disc space narrowing at the C5-6 and C6-7 levels unchanged. Prevertebral soft tissues are within normal. The atlantoaxial articulation is within normal. There is uncovertebral joint spurring and facet arthropathy as well as multilevel neural foraminal narrowing. There is no acute fracture or subluxation. Non fusion of the midline posterior arch of C1.  Subtle emphysematous disease over  the lung apices. 8 mm hypodense nodule over the right lobe of the thyroid unchanged.  IMPRESSION: No acute intracranial findings.  Age related atrophic change and chronic ischemic microvascular disease.  No acute cervical spine injury.  Mild to moderate spondylosis of the cervical spine with disc disease at the C5-6 and C6-7 levels. Multilevel neural foraminal narrowing unchanged.  8 mm right thyroid hypodense nodule unchanged.   Electronically Signed   By: Marin Olp M.D.   On: 11/12/2014 11:49     EKG Interpretation   Date/Time:  Wednesday Nov 12 2014 10:25:52 EDT Ventricular Rate:  75 PR Interval:  151 QRS Duration: 216 QT Interval:  516 QTC Calculation: 576 R Axis:   -101 Text Interpretation:  Sinus rhythm Right bundle branch block Confirmed by  Ellawyn Wogan  MD, Imanii Gosdin [phone]) on 11/12/2014 11:32:43 AM      MDM   Final diagnoses:  Fall    Neg scan,  Pt to follow up with your md this week    Milton Ferguson, MD 11/12/14 1247

## 2014-11-12 NOTE — Telephone Encounter (Signed)
FYI [name] with [facility] called in she went to see pt this morning she stated the wife went to get the pt because she thought he was sleep he hit his head has a knot and torn skin on top of the knot.  [name] is calling EMS because he is on coumadin.

## 2014-11-12 NOTE — ED Notes (Signed)
RN used interpreter to facilitate discharge. Pt and family understands

## 2014-11-16 ENCOUNTER — Encounter (HOSPITAL_COMMUNITY): Payer: Self-pay

## 2014-11-16 ENCOUNTER — Inpatient Hospital Stay (HOSPITAL_COMMUNITY)
Admission: EM | Admit: 2014-11-16 | Discharge: 2014-11-19 | DRG: 871 | Disposition: E | Payer: Medicare Other | Attending: Internal Medicine | Admitting: Internal Medicine

## 2014-11-16 ENCOUNTER — Emergency Department (HOSPITAL_COMMUNITY): Payer: Medicare Other

## 2014-11-16 DIAGNOSIS — I272 Other secondary pulmonary hypertension: Secondary | ICD-10-CM | POA: Diagnosis present

## 2014-11-16 DIAGNOSIS — I5023 Acute on chronic systolic (congestive) heart failure: Secondary | ICD-10-CM | POA: Diagnosis present

## 2014-11-16 DIAGNOSIS — I480 Paroxysmal atrial fibrillation: Secondary | ICD-10-CM | POA: Diagnosis present

## 2014-11-16 DIAGNOSIS — E875 Hyperkalemia: Secondary | ICD-10-CM | POA: Diagnosis present

## 2014-11-16 DIAGNOSIS — Z95810 Presence of automatic (implantable) cardiac defibrillator: Secondary | ICD-10-CM | POA: Diagnosis not present

## 2014-11-16 DIAGNOSIS — R079 Chest pain, unspecified: Secondary | ICD-10-CM

## 2014-11-16 DIAGNOSIS — I129 Hypertensive chronic kidney disease with stage 1 through stage 4 chronic kidney disease, or unspecified chronic kidney disease: Secondary | ICD-10-CM | POA: Diagnosis present

## 2014-11-16 DIAGNOSIS — R0602 Shortness of breath: Secondary | ICD-10-CM | POA: Diagnosis present

## 2014-11-16 DIAGNOSIS — R570 Cardiogenic shock: Secondary | ICD-10-CM | POA: Diagnosis present

## 2014-11-16 DIAGNOSIS — Z79899 Other long term (current) drug therapy: Secondary | ICD-10-CM | POA: Diagnosis not present

## 2014-11-16 DIAGNOSIS — Z7901 Long term (current) use of anticoagulants: Secondary | ICD-10-CM

## 2014-11-16 DIAGNOSIS — Z8673 Personal history of transient ischemic attack (TIA), and cerebral infarction without residual deficits: Secondary | ICD-10-CM | POA: Diagnosis not present

## 2014-11-16 DIAGNOSIS — N179 Acute kidney failure, unspecified: Secondary | ICD-10-CM | POA: Diagnosis present

## 2014-11-16 DIAGNOSIS — I2510 Atherosclerotic heart disease of native coronary artery without angina pectoris: Secondary | ICD-10-CM | POA: Diagnosis present

## 2014-11-16 DIAGNOSIS — E780 Pure hypercholesterolemia: Secondary | ICD-10-CM | POA: Diagnosis present

## 2014-11-16 DIAGNOSIS — R64 Cachexia: Secondary | ICD-10-CM | POA: Diagnosis present

## 2014-11-16 DIAGNOSIS — I252 Old myocardial infarction: Secondary | ICD-10-CM

## 2014-11-16 DIAGNOSIS — J9601 Acute respiratory failure with hypoxia: Secondary | ICD-10-CM | POA: Diagnosis present

## 2014-11-16 DIAGNOSIS — I429 Cardiomyopathy, unspecified: Secondary | ICD-10-CM | POA: Diagnosis present

## 2014-11-16 DIAGNOSIS — J189 Pneumonia, unspecified organism: Secondary | ICD-10-CM | POA: Diagnosis present

## 2014-11-16 DIAGNOSIS — A419 Sepsis, unspecified organism: Principal | ICD-10-CM | POA: Diagnosis present

## 2014-11-16 DIAGNOSIS — N183 Chronic kidney disease, stage 3 (moderate): Secondary | ICD-10-CM | POA: Diagnosis present

## 2014-11-16 DIAGNOSIS — E872 Acidosis: Secondary | ICD-10-CM | POA: Diagnosis present

## 2014-11-16 DIAGNOSIS — E785 Hyperlipidemia, unspecified: Secondary | ICD-10-CM | POA: Diagnosis present

## 2014-11-16 DIAGNOSIS — I469 Cardiac arrest, cause unspecified: Secondary | ICD-10-CM | POA: Diagnosis not present

## 2014-11-16 DIAGNOSIS — I509 Heart failure, unspecified: Secondary | ICD-10-CM

## 2014-11-16 LAB — CBC WITH DIFFERENTIAL/PLATELET
BASOS ABS: 0 10*3/uL (ref 0.0–0.1)
BASOS PCT: 0 % (ref 0–1)
Eosinophils Absolute: 0 10*3/uL (ref 0.0–0.7)
Eosinophils Relative: 0 % (ref 0–5)
HCT: 34.2 % — ABNORMAL LOW (ref 39.0–52.0)
Hemoglobin: 10.9 g/dL — ABNORMAL LOW (ref 13.0–17.0)
Lymphocytes Relative: 5 % — ABNORMAL LOW (ref 12–46)
Lymphs Abs: 0.7 10*3/uL (ref 0.7–4.0)
MCH: 24.3 pg — ABNORMAL LOW (ref 26.0–34.0)
MCHC: 31.9 g/dL (ref 30.0–36.0)
MCV: 76.2 fL — AB (ref 78.0–100.0)
Monocytes Absolute: 0.7 10*3/uL (ref 0.1–1.0)
Monocytes Relative: 5 % (ref 3–12)
NEUTROS ABS: 12 10*3/uL — AB (ref 1.7–7.7)
Neutrophils Relative %: 90 % — ABNORMAL HIGH (ref 43–77)
PLATELETS: 348 10*3/uL (ref 150–400)
RBC: 4.49 MIL/uL (ref 4.22–5.81)
RDW: 18.8 % — AB (ref 11.5–15.5)
WBC: 13.4 10*3/uL — AB (ref 4.0–10.5)

## 2014-11-16 LAB — URINALYSIS, ROUTINE W REFLEX MICROSCOPIC
Bilirubin Urine: NEGATIVE
Glucose, UA: NEGATIVE mg/dL
HGB URINE DIPSTICK: NEGATIVE
KETONES UR: NEGATIVE mg/dL
LEUKOCYTES UA: NEGATIVE
Nitrite: NEGATIVE
Protein, ur: NEGATIVE mg/dL
Specific Gravity, Urine: 1.007 (ref 1.005–1.030)
UROBILINOGEN UA: 0.2 mg/dL (ref 0.0–1.0)
pH: 7 (ref 5.0–8.0)

## 2014-11-16 LAB — COMPREHENSIVE METABOLIC PANEL
ALBUMIN: 3.5 g/dL (ref 3.5–5.0)
ALT: 59 U/L (ref 17–63)
AST: 50 U/L — ABNORMAL HIGH (ref 15–41)
Alkaline Phosphatase: 253 U/L — ABNORMAL HIGH (ref 38–126)
Anion gap: 18 — ABNORMAL HIGH (ref 5–15)
BUN: 63 mg/dL — AB (ref 6–20)
CHLORIDE: 92 mmol/L — AB (ref 101–111)
CO2: 14 mmol/L — ABNORMAL LOW (ref 22–32)
Calcium: 8.9 mg/dL (ref 8.9–10.3)
Creatinine, Ser: 2.94 mg/dL — ABNORMAL HIGH (ref 0.61–1.24)
GFR calc non Af Amer: 19 mL/min — ABNORMAL LOW (ref >60)
GFR, EST AFRICAN AMERICAN: 22 mL/min — AB (ref >60)
Glucose, Bld: 259 mg/dL — ABNORMAL HIGH (ref 65–99)
Potassium: 6.8 mmol/L (ref 3.5–5.1)
SODIUM: 124 mmol/L — AB (ref 135–145)
Total Bilirubin: 1 mg/dL (ref 0.3–1.2)
Total Protein: 7.2 g/dL (ref 6.5–8.1)

## 2014-11-16 LAB — I-STAT CG4 LACTIC ACID, ED: Lactic Acid, Venous: 6.6 mmol/L (ref 0.5–2.0)

## 2014-11-16 LAB — PROTIME-INR
INR: 3.86 — ABNORMAL HIGH (ref 0.00–1.49)
PROTHROMBIN TIME: 37 s — AB (ref 11.6–15.2)

## 2014-11-16 LAB — BRAIN NATRIURETIC PEPTIDE: B Natriuretic Peptide: >4500 pg/mL — ABNORMAL HIGH (ref 0.0–100.0)

## 2014-11-16 LAB — TROPONIN I: Troponin I: 0.06 ng/mL — ABNORMAL HIGH (ref <0.031)

## 2014-11-16 MED ORDER — DEXTROSE 50 % IV SOLN
1.0000 | Freq: Once | INTRAVENOUS | Status: AC
  Administered 2014-11-16: 50 mL via INTRAVENOUS
  Filled 2014-11-16: qty 50

## 2014-11-16 MED ORDER — VANCOMYCIN HCL IN DEXTROSE 1-5 GM/200ML-% IV SOLN
1000.0000 mg | Freq: Once | INTRAVENOUS | Status: AC
  Administered 2014-11-16: 1000 mg via INTRAVENOUS
  Filled 2014-11-16: qty 200

## 2014-11-16 MED ORDER — DEXTROSE 5 % IV SOLN: 2.0000 g | Freq: Once | INTRAVENOUS | Status: DC

## 2014-11-16 MED ORDER — VANCOMYCIN HCL 500 MG IV SOLR: 500.0000 mg | INTRAVENOUS | Status: DC

## 2014-11-16 MED ORDER — CALCIUM CHLORIDE 10 % IV SOLN
1.0000 g | Freq: Once | INTRAVENOUS | Status: AC
  Administered 2014-11-16: 1 g via INTRAVENOUS
  Filled 2014-11-16: qty 10

## 2014-11-16 MED ORDER — PIPERACILLIN-TAZOBACTAM 3.375 G IVPB
3.3750 g | Freq: Once | INTRAVENOUS | Status: AC
  Administered 2014-11-16: 3.375 g via INTRAVENOUS
  Filled 2014-11-16: qty 50

## 2014-11-16 MED ORDER — EPINEPHRINE HCL 0.1 MG/ML IJ SOSY
PREFILLED_SYRINGE | INTRAMUSCULAR | Status: DC | PRN
  Administered 2014-11-16 (×3): 1 mg via INTRAVENOUS

## 2014-11-16 MED ORDER — INSULIN ASPART 100 UNIT/ML ~~LOC~~ SOLN
10.0000 [IU] | Freq: Once | SUBCUTANEOUS | Status: AC
  Administered 2014-11-16: 10 [IU] via SUBCUTANEOUS
  Filled 2014-11-16: qty 1

## 2014-11-16 MED ORDER — IPRATROPIUM-ALBUTEROL 0.5-2.5 (3) MG/3ML IN SOLN
3.0000 mL | Freq: Once | RESPIRATORY_TRACT | Status: AC
  Administered 2014-11-16: 3 mL via RESPIRATORY_TRACT
  Filled 2014-11-16: qty 3

## 2014-11-16 MED ORDER — PIPERACILLIN-TAZOBACTAM IN DEX 2-0.25 GM/50ML IV SOLN
2.2500 g | Freq: Three times a day (TID) | INTRAVENOUS | Status: DC
  Filled 2014-11-16 (×2): qty 50

## 2014-11-16 MED ORDER — FUROSEMIDE 10 MG/ML IJ SOLN
80.0000 mg | Freq: Once | INTRAMUSCULAR | Status: AC
  Administered 2014-11-16: 80 mg via INTRAVENOUS
  Filled 2014-11-16: qty 8

## 2014-11-16 MED ORDER — SODIUM BICARBONATE 8.4 % IV SOLN
INTRAVENOUS | Status: DC | PRN
  Administered 2014-11-16: 100 meq via INTRAVENOUS

## 2014-11-16 MED ORDER — DOPAMINE-DEXTROSE 3.2-5 MG/ML-% IV SOLN
INTRAVENOUS | Status: AC
  Filled 2014-11-16: qty 250

## 2014-11-16 NOTE — Progress Notes (Signed)
°   11/10/2014 2334  Clinical Encounter Type  Visited With Patient;Family;Patient and family together;Health care provider  Visit Type Code;Death  Referral From Nurse  Stress Factors  Family Stress Factors Loss;Major life changes   Responded to code in ED. Patient receiving CPR upon my arrival. Spouse was in restroom sitting in chair upset. Interpreter was present. Patient was dying; and his sons were called to be with their mother. Comfort and listening provided. Follow-up with wife for arrangements, after son's speak with her. Family is with patient and will page chaplains office when needed.  Drue Dun, Contract Chaplain 11/27/14, 12:00 AM

## 2014-11-16 NOTE — H&P (Signed)
Primary Cardiologist (Advance HF): Jolaine Artist, MD   CC:  Worsening SOB for the past few days    HPI:  81 yo [name] (non-english speaking; wife also speaks very little English; used Veterinary surgeon) w/ PMHx significant for chronic systolic CHF (EF 53-97%), NICM, Medtronic CRT-D, LBBB, PAF (on coumadin), HTN, HLD, CVA, Pulmonary Hypertension and h/o seizure presented to the ER for evaluation. He has had multiple admissions for acute HF. Has been on home Milrinone since 06/2012, failed weaning in 02/2013 with worsening HF and PAH so restarted. He was admitted in 12/15 with acute on chronic systolic CHF when diuresed with IV Lasix and milrinone was increased to 0.5 mcg/kg/min. Currently he is living at home. Has home oxygen. He is not on ACE-I due to rash or ARB due to abnormal renal function. He was recently seen by HF service on 11/06/14 (note reviewed). He is on Lasix 80 mg IV MWF but last couple doses were held due to low [name] in 70's. He has home health services.    Today he presents with worsening SOB, decreasing appetite and oral intake and lethargy. No bleeding, fall, syncope, or ICD shocks reported.  Wife reported that he was warm to touch and has been mostly bed bound for the past couple days.    08/14/12 S/P successful DC-CV of AF 10/26/12 S/P BiV-ICD implant 01/07/13 ECHO EF 20%, restrictive diastolic function, mildly decreased RV size and systolic fxn, mild to moderate MR, PA systolic pressure 59 mmHg  02/21/13 ECHO EF 20% PA pressure mean 59 11/12/13: ECHO EF 20-25%, trivial AI,  07/07/14: ECHO EF 15% moderate RV dysfunction PAP 85  Review of Systems:  12 systems reviewed unremarkable except as noted in HPI     Past Medical History  Diagnosis Date  . HTN (hypertension)   . High cholesterol   . CHF (congestive heart failure)   . BBBB (bilateral bundle branch block)   . LBBB (left bundle branch block)   . Seizures 06/11/2012    new onset/notes (06/11/2012)  . SOB  (shortness of breath)     "sometimes when I lay down;; related to not taking my RX" (06/11/2012)  . Myocardial infarction     06/10/2012  . Atrial fibrillation   . Stroke   . Atrial thrombus     left  . Pulmonary HTN   . Atrial fibrillation 06/12/2012  . Coronary artery disease    No current facility-administered medications on file prior to encounter.   Current Outpatient Prescriptions on File Prior to Encounter  Medication Sig Dispense Refill  . acetaminophen (TYLENOL) 325 MG tablet Take 2 tablets (650 mg total) by mouth every 6 (six) hours as needed for mild pain (or Fever >/= 101).    Marland Kitchen amiodarone (PACERONE) 200 MG tablet Take 1 tablet (200 mg total) by mouth daily. 30 tablet 6  . atorvastatin (LIPITOR) 20 MG tablet Take 20 mg by mouth daily.    Marland Kitchen LORazepam (ATIVAN) 1 MG tablet Take 1 tablet (1 mg total) by mouth at bedtime as needed for anxiety. (Patient taking differently: Take 1 mg by mouth daily. ) 30 tablet 0  . magnesium oxide (MAG-OX) 400 MG tablet Take 1 tablet (400 mg total) by mouth 2 (two) times daily. 60 tablet 3  . metoCLOPramide (REGLAN) 10 MG tablet Take 1 tablet (10 mg total) by mouth 2 (two) times daily. 60 tablet 3  . metolazone (ZAROXOLYN) 2.5 MG tablet Take 2.5 mg by mouth. On Monday and  Aug 28, 2022    . milrinone (PRIMACOR) 20 MG/100ML SOLN infusion Inject 29.4 mcg/min into the vein continuous. 100 mL 5  . polyethylene glycol (MIRALAX / GLYCOLAX) packet Take 17 g by mouth daily. 14 each 6  . potassium chloride SA (K-DUR,KLOR-CON) 20 MEQ tablet Take 2 tablets (40 mEq total) by mouth 2 (two) times daily. 60 tablet 3  . senna-docusate (SENOKOT-S) 8.6-50 MG per tablet Take 2 tablets by mouth 2 (two) times daily.    . sorbitol 70 % SOLN Take 30 mLs by mouth daily. (Patient taking differently: Take 30 mLs by mouth daily as needed (constipation). ) 500 mL 3  . spironolactone (ALDACTONE) 25 MG tablet Take 1 tablet (25 mg total) by mouth 2 (two) times daily. 60 tablet 5  .  torsemide (DEMADEX) 20 MG tablet Take 4 tablets (80 mg total) by mouth 2 (two) times daily. Replace Monday Wednesday and Friday morning demadex with 39m IV lasix. AHC to manage 240 tablet 3  . traMADol (ULTRAM) 50 MG tablet Take 50 mg by mouth 2 (two) times daily as needed for moderate pain.   0  . warfarin (COUMADIN) 4 MG tablet Take 1 tablet (4 mg total) by mouth daily at 6 PM. Take 4 mg Monday, Wednesday, Friday and Sunday. 2 mg on Tues, Thurs and Sat (Patient taking differently: Take 2 mg by mouth daily at 6 PM. 1/2 tablet (2 mg) daily per anti-coag visit 11/10/14) 45 tablet 3     Allergies  Allergen Reactions  . Lisinopril Rash    ( pt denies this allergy)    History   Social History  . Marital Status: Divorced    Spouse Name: N/A  . Number of Children: N/A  . Years of Education: N/A   Occupational History  . Retired    Social History Main Topics  . Smoking status: Never Smoker   . Smokeless tobacco: Never Used  . Alcohol Use: No  . Drug Use: No  . Sexual Activity: No   Other Topics Concern  . Not on file   Social History Narrative   [place] (few words of English). Lives with wife.    Family History  Problem Relation Age of Onset  . Heart attack      father had MI in his 720s no fhx of early heart problem before age 79 . Heart attack      mother died of MI around 22000/03/10   PHYSICAL EXAM: Filed Vitals:   11/02/2014 2145  BP: 95/51  Pulse: 83  Temp:   Resp: 22   General:  Chronically ill, cachectic, moderate resp distress HEENT: muddy conjunctiva, EOMI, arcus senilis Neck: supple. +JVD. Carotids 2+ bilat; no bruits. No lymphadenopathy or thryomegaly appreciated. Cor: PMI laterally displaced. Regular rate & rhythm. No rubs, gallops. +2/6 sys reg murmur LLSB. Lungs: crackles LL bilaterally, shallow rapid breaths Abdomen: soft, nontender, distended. No hepatosplenomegaly. No bruits or masses. Good bowel sounds. Extremities: no cyanosis,  clubbing, rash; +1 ankle edema; distal extremities cool to touch Neuro: drowsy, oriented x1   ECG: AF, intermittent V capture with fusion beats (native and V paced), LBBB  Results for orders placed or performed during the hospital encounter of 10/27/2014 (from the past 24 hour(s))  Comprehensive metabolic panel     Status: Abnormal   Collection Time: 11/13/2014  8:11 PM  Result Value Ref Range   Sodium 124 (L) 135 - 145 mmol/L   Potassium 6.8 (HH) 3.5 - 5.1  mmol/L   Chloride 92 (L) 101 - 111 mmol/L   CO2 14 (L) 22 - 32 mmol/L   Glucose, Bld 259 (H) 65 - 99 mg/dL   BUN 63 (H) 6 - 20 mg/dL   Creatinine, Ser 2.94 (H) 0.61 - 1.24 mg/dL   Calcium 8.9 8.9 - 10.3 mg/dL   Total Protein 7.2 6.5 - 8.1 g/dL   Albumin 3.5 3.5 - 5.0 g/dL   AST 50 (H) 15 - 41 U/L   ALT 59 17 - 63 U/L   Alkaline Phosphatase 253 (H) 38 - 126 U/L   Total Bilirubin 1.0 0.3 - 1.2 mg/dL   GFR calc non Af Amer 19 (L) >60 mL/min   GFR calc Af Amer 22 (L) >60 mL/min   Anion gap 18 (H) 5 - 15  CBC with Differential     Status: Abnormal   Collection Time: 11/10/2014  8:11 PM  Result Value Ref Range   WBC 13.4 (H) 4.0 - 10.5 K/uL   RBC 4.49 4.22 - 5.81 MIL/uL   Hemoglobin 10.9 (L) 13.0 - 17.0 g/dL   HCT 34.2 (L) 39.0 - 52.0 %   MCV 76.2 (L) 78.0 - 100.0 fL   MCH 24.3 (L) 26.0 - 34.0 pg   MCHC 31.9 30.0 - 36.0 g/dL   RDW 18.8 (H) 11.5 - 15.5 %   Platelets 348 150 - 400 K/uL   Neutrophils Relative % 90 (H) 43 - 77 %   Neutro Abs 12.0 (H) 1.7 - 7.7 K/uL   Lymphocytes Relative 5 (L) 12 - 46 %   Lymphs Abs 0.7 0.7 - 4.0 K/uL   Monocytes Relative 5 3 - 12 %   Monocytes Absolute 0.7 0.1 - 1.0 K/uL   Eosinophils Relative 0 0 - 5 %   Eosinophils Absolute 0.0 0.0 - 0.7 K/uL   Basophils Relative 0 0 - 1 %   Basophils Absolute 0.0 0.0 - 0.1 K/uL  Protime-INR     Status: Abnormal   Collection Time: 10/20/2014  8:11 PM  Result Value Ref Range   Prothrombin Time 37.0 (H) 11.6 - 15.2 seconds   INR 3.86 (H) 0.00 - 1.49  Brain  natriuretic peptide     Status: Abnormal   Collection Time: 11/01/2014  8:11 PM  Result Value Ref Range   B Natriuretic Peptide >4500.0 (H) 0.0 - 100.0 pg/mL  Troponin I     Status: Abnormal   Collection Time: 10/24/2014  8:11 PM  Result Value Ref Range   Troponin I 0.06 (H) <0.031 ng/mL  I-Stat CG4 Lactic Acid, ED (Not at [facility] or [facility])     Status: Abnormal   Collection Time: 11/10/2014  8:35 PM  Result Value Ref Range   Lactic Acid, Venous 6.60 (HH) 0.5 - 2.0 mmol/L   Comment NOTIFIED PHYSICIAN   Urinalysis, Routine w reflex microscopic     Status: None   Collection Time: 11/09/2014  8:47 PM  Result Value Ref Range   Color, Urine YELLOW YELLOW   APPearance CLEAR CLEAR   Specific Gravity, Urine 1.007 1.005 - 1.030   pH 7.0 5.0 - 8.0   Glucose, UA NEGATIVE NEGATIVE mg/dL   Hgb urine dipstick NEGATIVE NEGATIVE   Bilirubin Urine NEGATIVE NEGATIVE   Ketones, ur NEGATIVE NEGATIVE mg/dL   Protein, ur NEGATIVE NEGATIVE mg/dL   Urobilinogen, UA 0.2 0.0 - 1.0 mg/dL   Nitrite NEGATIVE NEGATIVE   Leukocytes, UA NEGATIVE NEGATIVE   Dg Chest [facility] 1 View  10/25/2014  CLINICAL DATA:  Acute onset of shortness of breath. Initial encounter.  EXAM: PORTABLE CHEST - 1 VIEW  COMPARISON:  Chest radiograph performed 10/25/2014  FINDINGS: The lungs are hypoexpanded. Vascular crowding and vascular congestion are seen. Mild bilateral central airspace opacity could reflect minimal interstitial edema. No pleural effusion or pneumothorax is identified.  The cardiomediastinal silhouette is enlarged. A pacemaker/AICD is noted overlying the left chest wall, with leads ending overlying the right atrium, right ventricle and coronary sinus. A right IJ line is noted ending about the distal SVC. No acute osseous abnormalities are seen.  IMPRESSION: Lungs hypoexpanded. Vascular congestion and cardiomegaly noted. Mild bilateral central airspace opacity could reflect minimal interstitial edema, given the patient's symptoms.    Electronically Signed   By: Garald Balding M.D.   On: 11/15/2014 20:25     ASSESSMENT:  1. Acute on chronic systolic HF - Has had multiple admissions; on high dose home Milrinone 0.5 mcg - Medtronic CRT-D in place with intermittent capture; no shocks reported  - Has history of poor medical compliance  2. Cardiogenic Shock, multiorgan failure  3. SIRS - sepsis/pneumonia?  4. AKI on CKD-3  5. Acidosis - likely mixed  6. Hyperkalemia - potential factors: KCL supplement, Aldactone, AKI - in the ER he was already given calcium, insulin, glucose   7. Long term anticoagulation   8. PAF    PLAN/DISCUSSION: - Blood cx /U cx sent in the ER; Received Zosyn and Vanc in the ER, will continue - Lasix IV 80 mg bid, foley, I-O, daily weight, adjust Milrinone dose - Bronchodilator, O2 (on home O2 as well) - Trend BMP and lactate   Guarded prognosis   After admission orders were written in the ER and Pulm/CC consult sought, patient developed resp distress (he/wife requested for no intubation) and bradycardic, ACLS was given by ER attending and was finally pronounced by him at 1155 pm. Met with the family afterwards, offered support.     Wandra Mannan, MD Cardiology

## 2014-11-16 NOTE — Progress Notes (Signed)
ANTIBIOTIC CONSULT NOTE - INITIAL  Pharmacy Consult for  Zosyn and vancomycin Indication: HCAP  Allergies  Allergen Reactions  . Lisinopril Rash    ( pt denies this allergy)    Patient Measurements: Height: 5\' 7"  (170.2 cm) Weight: 123 lb (55.792 kg) IBW/kg (Calculated) : 66.1   Vital Signs: Temp: 97.3 F (36.3 C) (05/29 2056) Temp Source: Rectal (05/29 2056) BP: 84/52 mmHg (05/29 2044) Pulse Rate: 67 (05/29 2000) Intake/Output from previous day:   Intake/Output from this shift:    Labs:  Recent Labs  10/28/2014 2011  WBC 13.4*  HGB 10.9*  PLT 348   Estimated Creatinine Clearance: 19.2 mL/min (by C-G formula based on Cr of 2.42). No results for input(s): VANCOTROUGH, VANCOPEAK, VANCORANDOM, GENTTROUGH, GENTPEAK, GENTRANDOM, TOBRATROUGH, TOBRAPEAK, TOBRARND, AMIKACINPEAK, AMIKACINTROU, AMIKACIN in the last 72 hours.   Microbiology: Recent Results (from the past 720 hour(s))  MRSA PCR Screening     Status: None   Collection Time: 10/25/14  3:30 PM  Result Value Ref Range Status   MRSA by PCR NEGATIVE NEGATIVE Final    Comment:        The GeneXpert MRSA Assay (FDA approved for NASAL specimens only), is one component of a comprehensive MRSA colonization surveillance program. It is not intended to diagnose MRSA infection nor to guide or monitor treatment for MRSA infections.     Medical History: Past Medical History  Diagnosis Date  . HTN (hypertension)   . High cholesterol   . CHF (congestive heart failure)   . BBBB (bilateral bundle branch block)   . LBBB (left bundle branch block)   . Seizures 06/11/2012    new onset/notes (06/11/2012)  . SOB (shortness of breath)     "sometimes when I lay down;; related to not taking my RX" (06/11/2012)  . Myocardial infarction     06/10/2012  . Atrial fibrillation   . Stroke   . Atrial thrombus     left  . Pulmonary HTN   . Atrial fibrillation 06/12/2012  . Coronary artery disease     Assessment: 81 yo  M to start zosyn and vancomycin for HCAP.   Wt 55.8 kg, creat 2.94; WBC `13.4, temp 97.3, lactic acid 6.6;  5/29 CXR: neg for PNA  Vancomycin 5/29>> Zosyn 5/29>>  5/29 Bcx2>> 5/29 Ucx>>  Goal of Therapy:  Vancomycin trough level 15-20 mcg/ml  Plan:  -zosyn 3.375 gm IV x 1 dose in ED then zosyn 2.25 gm IV q8h -vancomycin 1 mg IV x 1 in ED then vancomycin 500 mg IV q24h -f/u renal function, wbc, temp, culture data -vancomycin levels as needed  Eudelia Bunch, Pharm.D. [phone] 11/15/2014 9:27 PM

## 2014-11-16 NOTE — Code Documentation (Addendum)
Patient time of death occurred at 10-10-2353.

## 2014-11-16 NOTE — ED Provider Notes (Addendum)
CSN: [id number]     Arrival date & time 11/06/2014  1930 History   First MD Initiated Contact with Patient 11/11/2014 1945     Chief Complaint  Patient presents with  . Respiratory Distress  . Chest Pain     (Consider location/radiation/quality/duration/timing/severity/associated sxs/prior Treatment) HPI Comments: Patient here with acute onset of shortness of breath at home this evening at rest. Has had productive cough as well as some rhonchi. Patient has end-stage CHF and is on home milrinone via a PICC line. No reported fever or chills. No vomiting or diarrhea. Did have some chest tightness associated with taking a deep breath. Called EMS and transported here.  The history is provided by the patient and a relative.    Past Medical History  Diagnosis Date  . HTN (hypertension)   . High cholesterol   . CHF (congestive heart failure)   . BBBB (bilateral bundle branch block)   . LBBB (left bundle branch block)   . Seizures 06/11/2012    new onset/notes (06/11/2012)  . SOB (shortness of breath)     "sometimes when I lay down;; related to not taking my RX" (06/11/2012)  . Myocardial infarction     06/10/2012  . Atrial fibrillation   . Stroke   . Atrial thrombus     left  . Pulmonary HTN   . Atrial fibrillation 06/12/2012  . Coronary artery disease    Past Surgical History  Procedure Laterality Date  . Throat surgery  10/16/40    "and nose" (06/11/2012); ?T&A  . Inguinal hernia repair  ~ 10-17-06    "both sides" (06/11/2012)  . Tee without cardioversion  06/29/2012    Procedure: TRANSESOPHAGEAL ECHOCARDIOGRAM (TEE);  Surgeon: Jolaine Artist, MD;  Location: [facility] ENDOSCOPY;   . Cardioversion N/A 08/14/2012    Procedure: CARDIOVERSION;  Surgeon: Jolaine Artist, MD;  Location: [facility] ENDOSCOPY;  . Tee without cardioversion N/A 11/12/2013    Procedure: TRANSESOPHAGEAL ECHOCARDIOGRAM (TEE);  Surgeon: Candee Furbish, MD;  Location: [facility] ENDOSCOPY;   . Tee without cardioversion N/A 11/19/2013     Procedure: TRANSESOPHAGEAL ECHOCARDIOGRAM (TEE);  Surgeon: Jolaine Artist, MD;  Location: [facility] ENDOSCOPY;   . Cardioversion N/A 12/27/2013    Procedure: CARDIOVERSION;  Surgeon: Jolaine Artist, MD;  Location: [facility] ENDOSCOPY;   . Left and right heart catheterization with coronary angiogram N/A 07/02/2012    Procedure: LEFT AND RIGHT HEART CATHETERIZATION WITH CORONARY ANGIOGRAM;  Surgeon: Jolaine Artist, MD;  Location: [place] CATH LAB; Mild non-obstructive CAD, severe PAH, PA = 93/42 (59), severe NICM w/ EF 15%  . Right heart catheterization N/A 07/06/2012    Procedure: RIGHT HEART CATH;  Surgeon: Jolaine Artist, MD; to assess response to milrinone; Moderate PH with profound reduction in PVR with milrinone/PDE-5 inhibitor   . Bi-ventricular implantable cardioverter defibrillator N/A 10/26/2012    Procedure: BI-VENTRICULAR IMPLANTABLE CARDIOVERTER DEFIBRILLATOR  (CRT-D);  Surgeon: Evans Lance, MD;  Medtronic Evera XT CRTD, BiV ICD serial #[id number]     Family History  Problem Relation Age of Onset  . Heart attack      father had MI in his 1s, no fhx of early heart problem before age 85  . Heart attack      mother died of MI around 17-Oct-1998   History  Substance Use Topics  . Smoking status: Never Smoker   . Smokeless tobacco: Never Used  . Alcohol Use: No    Review of Systems  All other systems reviewed and  are negative.     Allergies  Lisinopril  Home Medications   Prior to Admission medications   Medication Sig Start Date End Date Taking? Authorizing Provider  acetaminophen (TYLENOL) 325 MG tablet Take 2 tablets (650 mg total) by mouth every 6 (six) hours as needed for mild pain (or Fever >/= 101). 07/10/14   Kinnie Feil, MD  amiodarone (PACERONE) 200 MG tablet Take 1 tablet (200 mg total) by mouth daily. 11/06/14   Jolaine Artist, MD  atorvastatin (LIPITOR) 20 MG tablet Take 20 mg by mouth daily.    Historical Provider, MD  LORazepam (ATIVAN) 1 MG tablet Take 1  tablet (1 mg total) by mouth at bedtime as needed for anxiety. Patient taking differently: Take 1 mg by mouth daily.  09/29/14   [name] D Clegg, NP  magnesium oxide (MAG-OX) 400 MG tablet Take 1 tablet (400 mg total) by mouth 2 (two) times daily. 04/25/14   Jolaine Artist, MD  metoCLOPramide (REGLAN) 10 MG tablet Take 1 tablet (10 mg total) by mouth 2 (two) times daily. 07/31/14   Jolaine Artist, MD  metolazone (ZAROXOLYN) 2.5 MG tablet Take 2.5 mg by mouth. On Monday and Thursday 11/08/14   Historical Provider, MD  milrinone (PRIMACOR) 20 MG/100ML SOLN infusion Inject 29.4 mcg/min into the vein continuous. 10/30/14   [name] D Clegg, NP  polyethylene glycol (MIRALAX / GLYCOLAX) packet Take 17 g by mouth daily. 08/23/14   Isaiah Serge, NP  potassium chloride SA (K-DUR,KLOR-CON) 20 MEQ tablet Take 2 tablets (40 mEq total) by mouth 2 (two) times daily. 10/09/14   Shaune Pascal Bensimhon, MD  senna-docusate (SENOKOT-S) 8.6-50 MG per tablet Take 2 tablets by mouth 2 (two) times daily.    Historical Provider, MD  sorbitol 70 % SOLN Take 30 mLs by mouth daily. Patient taking differently: Take 30 mLs by mouth daily as needed (constipation).  09/18/14   Almyra Deforest, PA  spironolactone (ALDACTONE) 25 MG tablet Take 1 tablet (25 mg total) by mouth 2 (two) times daily. 09/18/14   Almyra Deforest, PA  torsemide (DEMADEX) 20 MG tablet Take 4 tablets (80 mg total) by mouth 2 (two) times daily. Replace Monday Wednesday and Friday morning demadex with 80mg  IV lasix. AHC to manage 10/31/14   [name] D Clegg, NP  traMADol (ULTRAM) 50 MG tablet Take 50 mg by mouth 2 (two) times daily as needed for moderate pain.  07/22/14   Historical Provider, MD  warfarin (COUMADIN) 4 MG tablet Take 1 tablet (4 mg total) by mouth daily at 6 PM. Take 4 mg Monday, Wednesday, Friday and Sunday. 2 mg on Tues, Thurs and Sat Patient taking differently: Take 2-4 mg by mouth daily at 6 PM. Take 1 tablet (4 mg) on Monday, take 1/2 tablet (2 mg) on Sunday, Tuesday,  Wednesday, Thursday, Friday, Saturday (per anti-coag visit 10/20/14) 09/18/14   Almyra Deforest, PA   SpO2 98% Physical Exam  Constitutional: He is oriented to person, place, and time. He appears cachectic.  Non-toxic appearance. He has a sickly appearance. He appears ill. He appears distressed.  HENT:  Head: Normocephalic and atraumatic.  Eyes: Conjunctivae, EOM and lids are normal. Pupils are equal, round, and reactive to light.  Neck: Normal range of motion. Neck supple. No tracheal deviation present. No thyroid mass present.  Cardiovascular: Normal rate, regular rhythm and normal heart sounds.  Exam reveals no gallop.   No murmur heard. Pulmonary/Chest: Effort normal. No stridor. No respiratory distress. He has  decreased breath sounds. He has no wheezes. He has rhonchi. He has no rales.  Abdominal: Soft. Normal appearance and bowel sounds are normal. He exhibits no distension. There is no tenderness. There is no rebound and no CVA tenderness.  Musculoskeletal: Normal range of motion. He exhibits no edema or tenderness.  Neurological: He is alert and oriented to person, place, and time. He has normal strength. No cranial nerve deficit or sensory deficit. GCS eye subscore is 4. GCS verbal subscore is 5. GCS motor subscore is 6.  Skin: Skin is warm and dry. No abrasion and no rash noted.  Psychiatric: He has a normal mood and affect. His speech is normal and behavior is normal.  Nursing note and vitals reviewed.   ED Course  Procedures (including critical care time) Labs Review Labs Reviewed  URINE CULTURE  COMPREHENSIVE METABOLIC PANEL  CBC WITH DIFFERENTIAL/PLATELET  URINALYSIS, ROUTINE W REFLEX MICROSCOPIC (NOT AT [facility])  [place] PEPTIDE  TROPONIN I    Imaging Review No results found.   EKG Interpretation   Date/Time:  Sunday Nov 16 2014 19:34:59 EDT Ventricular Rate:  67 PR Interval:    QRS Duration: 226 QT Interval:  675 QTC Calculation: 713 R Axis:    -58 Text Interpretation:  sinus rythm electronic pacer Left bundle branch  block Confirmed by Lamaria Hildebrandt  MD, Damaria Vachon ([id number]) on 11/07/2014 8:05:27 PM      MDM   Final diagnoses:  SOB (shortness of breath)  Chest pain    Patient with elevated lactate here. He is afebrile. His anxiety urinary symptoms. However due to his lactate will start sepsis order set. His neuro status is back to normal according to the family. Blood pressure has gradually improved on his own. No IV fluids given. Due to his history of severe SA CHF will hold on bolusing with IV fluids at this time. Will contact critical care. Patient started on presumptive antibodies for pulmonary infection. Will admit to the hospital  9:07 PM Spoke with critical care physician and he reviewed the patient's chart and doesn't feel that this patient meets sepsis criteria. It was at the patient's elevated lactate is likely from his known history of cardiomyopathy. He agrees with and a box at this time and recommends admission to cardiology  9:25 PM Patient electrolytes show hyperkalemia and acidosis. Blood pressure has improved. Patient given calcium, insulin, glucose. I discussed the case with cardiology and they will come to see the patient.  12:00 AM Patient had been seen by the cardiology fellow and orders written for the patient upstairs to the room. Patient then had asthma respirations and bradycardia. Patient was given ACLS including chest compressions and medications. He had transient responses to these interventions. The critical care physician was also at bedside. We both spoke with the patients wife on the patient's critical condition. The patient had been made a DO NOT INTUBATE. The patient was eventually pronounced deceased at 11:55 PM. Case discussed with the cardiology fellow and he will contact the patient's cardiologist and have him sign the death certificate  CRITICAL CARE Performed by: Leota Jacobsen Total critical care time:  85 Critical care time was exclusive of separately billable procedures and treating other patients. Critical care was necessary to treat or prevent imminent or life-threatening deterioration. Critical care was time spent personally by me on the following activities: development of treatment plan with patient and/or surrogate as well as nursing, discussions with consultants, evaluation of patient's response to treatment, examination of patient,  obtaining history from patient or surrogate, ordering and performing treatments and interventions, ordering and review of laboratory studies, ordering and review of radiographic studies, pulse oximetry and re-evaluation of patient's condition.     Lacretia Leigh, MD 11/01/2014 [id number]  Lacretia Leigh, MD 11/12/2014 2107  Lacretia Leigh, MD 10/22/2014 [id number]  Lacretia Leigh, MD 12-12-14 0001  Lacretia Leigh, MD 12-12-2014 Dyann Kief

## 2014-11-16 NOTE — Progress Notes (Signed)
RT Note:  RT called to get abg, then called while en route to  Place pt on bipap.  Rt returned with bipap, hooked machine up and started to place the mask when CPR began.  Rt was head of bed bagging pt while compressions were preformed. Rt bagged for 30 minutes with no return of spontaneous circulation.  MD called at 1140pm.

## 2014-11-16 NOTE — ED Notes (Signed)
Pt arrived via EMS c/o resp distress and chest pain.  Family stated this is recurrent, pt took lorazepam at home, family states that usually helps with these panic attack type episodes - reported by EMS.  Subclavian line running Milrinone Lactate 7 day bag.

## 2014-11-16 NOTE — Consult Note (Signed)
 PULMONARY  / [place]   Name: Tyler Christensen MRN: [id number] DOB: [birth year]    ADMISSION DATE:  11/03/2014 CONSULTATION DATE: 10/31/2014  REQUESTING CLINICIAN: Dr. Zenia Resides PRIMARY SERVICE: ED  CHIEF COMPLAINT:  Cardiogenic shock, SOB  BRIEF PATIENT DESCRIPTION: 82yom with end stage CHF on home milrinone presented with SOB, found to be in cardiogenic shock.  PEA in ED without ROSC despite CPR efforts.   HISTORY OF PRESENT ILLNESS:   82yom with end stage systolic CHF 2/2 NICM on home milrinone presented with SOB, found to be in cardiogenic shock.  Hx obtained from chart review and discussion with ED physician.  He has had multiple prior admits for CHF and has been on milrinone for about 2.5 yrs.  He presented with increased SOB and lethargy.  This was thought to be 2/2 cardiogenic shock.  Cardiology evaluated pt and admitted him for further tx.  We were consulted for comanagement.  Upon my initial arrival to the pts bedside in the ED, CPR was underway.  It had in process for approx 2 minutes 2/2 PEA.  The pt received multiple rounds for epi and was started on dopamine gtt.  He had a brief period of ROSC but this quickly dissappated.  Notably, the pt was DNI so he was not intubated during the CPR.  After multiple rounds of CPR/meds without good response, a conversation was had between myself and the ED physician.  It was felt that given his severe comorbidities and lack of ROSC despite prolonged CPR, the chance of meaningful recovery was nonexistent and that further chest compressions would be futile.  This was discussed with the pt's wife at bedside.  His sons were later updated as well.  TOD 2340.    PAST MEDICAL HISTORY :  Past Medical History  Diagnosis Date  . HTN (hypertension)   . High cholesterol   . CHF (congestive heart failure)   . BBBB (bilateral bundle branch block)   . LBBB (left bundle branch block)   . Seizures 06/11/2012    new onset/notes (06/11/2012)  .  SOB (shortness of breath)     "sometimes when I lay down;; related to not taking my RX" (06/11/2012)  . Myocardial infarction     06/10/2012  . Atrial fibrillation   . Stroke   . Atrial thrombus     left  . Pulmonary HTN   . Atrial fibrillation 06/12/2012  . Coronary artery disease    Past Surgical History  Procedure Laterality Date  . Throat surgery  1942    "and nose" (06/11/2012); ?T&A  . Inguinal hernia repair  ~ 2008    "both sides" (06/11/2012)  . Tee without cardioversion  06/29/2012    Procedure: TRANSESOPHAGEAL ECHOCARDIOGRAM (TEE);  Surgeon: Jolaine Artist, MD;  Location: [facility] ENDOSCOPY;   . Cardioversion N/A 08/14/2012    Procedure: CARDIOVERSION;  Surgeon: Jolaine Artist, MD;  Location: [facility] ENDOSCOPY;  . Tee without cardioversion N/A 11/12/2013    Procedure: TRANSESOPHAGEAL ECHOCARDIOGRAM (TEE);  Surgeon: Candee Furbish, MD;  Location: [facility] ENDOSCOPY;   . Tee without cardioversion N/A 11/19/2013    Procedure: TRANSESOPHAGEAL ECHOCARDIOGRAM (TEE);  Surgeon: Jolaine Artist, MD;  Location: [facility] ENDOSCOPY;   . Cardioversion N/A 12/27/2013    Procedure: CARDIOVERSION;  Surgeon: Jolaine Artist, MD;  Location: [facility] ENDOSCOPY;   . Left and right heart catheterization with coronary angiogram N/A 07/02/2012    Procedure: LEFT AND RIGHT HEART CATHETERIZATION WITH CORONARY ANGIOGRAM;  Surgeon: Shaune Pascal  Bensimhon, MD;  Location: [place] CATH LAB; Mild non-obstructive CAD, severe PAH, PA = 93/42 (59), severe NICM w/ EF 15%  . Right heart catheterization N/A 07/06/2012    Procedure: RIGHT HEART CATH;  Surgeon: Jolaine Artist, MD; to assess response to milrinone; Moderate PH with profound reduction in PVR with milrinone/PDE-5 inhibitor   . Bi-ventricular implantable cardioverter defibrillator N/A 10/26/2012    Procedure: BI-VENTRICULAR IMPLANTABLE CARDIOVERTER DEFIBRILLATOR  (CRT-D);  Surgeon: Evans Lance, MD;  Medtronic Evera XT CRTD, BiV ICD serial #[id number]     Prior to Admission  medications   Medication Sig Start Date End Date Taking? Authorizing Provider  milrinone (PRIMACOR) 20 MG/100ML SOLN infusion Inject 29.4 mcg/min into the vein continuous. 10/30/14  Yes [name] D Ninfa Meeker, NP  warfarin (COUMADIN) 4 MG tablet Take 1 tablet (4 mg total) by mouth daily at 6 PM. Take 4 mg Monday, Wednesday, Friday and Sunday. 2 mg on Tues, Thurs and Sat Patient taking differently: Take 2 mg by mouth daily at 6 PM. 1/2 tablet (2 mg) daily per anti-coag visit 11/10/14 09/18/14  Yes Almyra Deforest, PA  acetaminophen (TYLENOL) 325 MG tablet Take 2 tablets (650 mg total) by mouth every 6 (six) hours as needed for mild pain (or Fever >/= 101). 07/10/14   Kinnie Feil, MD  amiodarone (PACERONE) 200 MG tablet Take 1 tablet (200 mg total) by mouth daily. 11/06/14   Jolaine Artist, MD  atorvastatin (LIPITOR) 20 MG tablet Take 20 mg by mouth daily.    Historical Provider, MD  LORazepam (ATIVAN) 1 MG tablet Take 1 tablet (1 mg total) by mouth at bedtime as needed for anxiety. Patient taking differently: Take 1 mg by mouth daily.  09/29/14   [name] D Clegg, NP  magnesium oxide (MAG-OX) 400 MG tablet Take 1 tablet (400 mg total) by mouth 2 (two) times daily. 04/25/14   Jolaine Artist, MD  metoCLOPramide (REGLAN) 10 MG tablet Take 1 tablet (10 mg total) by mouth 2 (two) times daily. 07/31/14   Jolaine Artist, MD  metolazone (ZAROXOLYN) 2.5 MG tablet Take 2.5 mg by mouth. On Monday and Thursday 11/08/14   Historical Provider, MD  polyethylene glycol (MIRALAX / GLYCOLAX) packet Take 17 g by mouth daily. 08/23/14   Isaiah Serge, NP  potassium chloride SA (K-DUR,KLOR-CON) 20 MEQ tablet Take 2 tablets (40 mEq total) by mouth 2 (two) times daily. 10/09/14   Shaune Pascal Bensimhon, MD  senna-docusate (SENOKOT-S) 8.6-50 MG per tablet Take 2 tablets by mouth 2 (two) times daily.    Historical Provider, MD  sorbitol 70 % SOLN Take 30 mLs by mouth daily. Patient taking differently: Take 30 mLs by mouth daily as needed  (constipation).  09/18/14   Almyra Deforest, PA  spironolactone (ALDACTONE) 25 MG tablet Take 1 tablet (25 mg total) by mouth 2 (two) times daily. 09/18/14   Almyra Deforest, PA  torsemide (DEMADEX) 20 MG tablet Take 4 tablets (80 mg total) by mouth 2 (two) times daily. Replace Monday Wednesday and Friday morning demadex with 80mg  IV lasix. AHC to manage 10/31/14   [name] D Clegg, NP  traMADol (ULTRAM) 50 MG tablet Take 50 mg by mouth 2 (two) times daily as needed for moderate pain.  07/22/14   Historical Provider, MD   Allergies  Allergen Reactions  . Lisinopril Rash    ( pt denies this allergy)    FAMILY HISTORY:  Family History  Problem Relation Age of Onset  . Heart attack  father had MI in his 55s, no fhx of early heart problem before age 70  . Heart attack      mother died of MI around 63   SOCIAL HISTORY: Unable to obtain  REVIEW OF SYSTEMS:  Unable to obtain  SUBJECTIVE:   VITAL SIGNS: Temp:  [97.3 F (36.3 C)] 97.3 F (36.3 C) (05/29 2056) Pulse Rate:  [58-83] 83 (05/29 2145) Resp:  [21-25] 22 (05/29 2145) BP: (75-106)/(46-61) 95/51 mmHg (05/29 2145) SpO2:  [92 %-100 %] 92 % (05/29 2225) Weight:  [123 lb (55.792 kg)] 123 lb (55.792 kg) (05/29 2025) HEMODYNAMICS:  PHYSICAL EXAMINATION: General:  Unresponsive Neuro:  Unresponsive Cardiovascular:  Pulseless Lungs:  Agonal breathing noted on arrival Abdomen:  Distended Musculoskeletal:  Flaccid Skin:  Cool  LABS:  CBC  Recent Labs Lab 11/12/14 1044 11/10/2014 2011  WBC 12.9* 13.4*  HGB 10.2* 10.9*  HCT 31.5* 34.2*  PLT 281 348   Coag's  Recent Labs Lab 11/10/14 11/12/14 1044 10/27/2014 2011  INR 3.0 3.99* 3.86*   BMET  Recent Labs Lab 11/12/14 1044 10/23/2014 2011  NA 122* 124*  K 5.1 6.8*  CL 89* 92*  CO2 21* 14*  BUN 65* 63*  CREATININE 2.42* 2.94*  GLUCOSE 149* 259*   Electrolytes  Recent Labs Lab 11/12/14 1044 11/08/2014 2011  CALCIUM 8.9 8.9   Sepsis Markers  Recent Labs Lab 10/29/2014 2035   LATICACIDVEN 6.60*   ABG No results for input(s): PHART, PCO2ART, PO2ART in the last 168 hours. Liver Enzymes  Recent Labs Lab 10/30/2014 2011  AST 50*  ALT 59  ALKPHOS 253*  BILITOT 1.0  ALBUMIN 3.5   Cardiac Enzymes  Recent Labs Lab 11/09/2014 2011  TROPONINI 0.06*   Glucose No results for input(s): GLUCAP in the last 168 hours.  Imaging Dg Chest Port 1 View  10/25/2014   CLINICAL DATA:  Acute onset of shortness of breath. Initial encounter.  EXAM: PORTABLE CHEST - 1 VIEW  COMPARISON:  Chest radiograph performed 10/25/2014  FINDINGS: The lungs are hypoexpanded. Vascular crowding and vascular congestion are seen. Mild bilateral central airspace opacity could reflect minimal interstitial edema. No pleural effusion or pneumothorax is identified.  The cardiomediastinal silhouette is enlarged. A pacemaker/AICD is noted overlying the left chest wall, with leads ending overlying the right atrium, right ventricle and coronary sinus. A right IJ line is noted ending about the distal SVC. No acute osseous abnormalities are seen.  IMPRESSION: Lungs hypoexpanded. Vascular congestion and cardiomegaly noted. Mild bilateral central airspace opacity could reflect minimal interstitial edema, given the patient's symptoms.   Electronically Signed   By: Garald Balding M.D.   On: 11/14/2014 20:25     CXR: Cardiomegaly, increased vascular markings.    ASSESSMENT / PLAN:  Please see above HPI for details.  Briefly, pt presented in cardiogenic shock, had a PEA arrest in the ED with inability to achieve ROSC despite CPR.     I have personally obtained a history, examined the patient, evaluated laboratory and imaging results, formulated the assessment and plan and placed orders.  CRITICAL CARE: The patient is critically ill with multiple organ systems failure and requires high complexity decision making for assessment and support, frequent evaluation and titration of therapies, application of advanced  monitoring technologies and extensive interpretation of multiple databases. Critical Care Time devoted to patient care services described in this note is 40 minutes.   Lucrezia Starch, MD Pulmonary and [place] Pager: [phone]  , 11:54 PM

## 2014-11-17 MED FILL — Medication: Qty: 1 | Status: AC

## 2014-11-18 ENCOUNTER — Encounter (HOSPITAL_COMMUNITY): Payer: Self-pay

## 2014-11-18 LAB — URINE CULTURE
CULTURE: NO GROWTH
Colony Count: NO GROWTH

## 2014-11-18 NOTE — Progress Notes (Signed)
Death certificate for patient completed by Dr. [name] with [facility].  [facility] called and made aware.  Copy faxed to their office to provided fax # [phone].  Copy scanned into patient's medical records.  [facility] to retrieve original copy tomorrow.  Renee Pain

## 2014-11-19 LAB — CULTURE, BLOOD (ROUTINE X 2)

## 2014-11-19 NOTE — Discharge Summary (Signed)
Physician Discharge Summary  Patient ID: Tyler Christensen MRN: [id number] DOB/AGE: 1933-05-23 81 y.o.  Admit date: 11/15/2014 Discharge date: Nov 30, 2014  Admission Diagnoses: 1. Acute on chronic systolic HF 2. Cardiogenic Shock, multiorgan failure 3. SIRS - sepsis/pneumonia? 4. AKI on CKD-3 5. Acidosis - likely mixed 6. Hyperkalemia 7. Long term anticoagulation  8. PAF  Discharge Diagnoses:  Principal Problem:   Acute on chronic systolic heart failure Active Problems:   Long-term (current) use of anticoagulants   Acute respiratory failure with hypoxia   AKI (acute kidney injury)   Acute hyperkalemia   Acute on chronic heart failure   Discharged Condition: Deceased   Hospital Course: Patient was evaluated in the ER for admission. However, he further decompensated quickly while still in the ER despite aggressive medical therapy. He was pronounced by the ER attending. Please refer to admission H&P and ER documentation for details. Discussed in detail with wife the over critical condition upon admission and met with her again after the patient was pronounced.     Discharge Exam: Blood pressure 95/51, pulse 83, temperature 97.3 F (36.3 C), temperature source Rectal, resp. rate 22, height _0  (1.702 m), weight 55.792 kg (123 lb), SpO2 92 %. Unresponsive, ashened color, no signs of spontaneous breathing or circulation  No gag, pupillary or corneal reflexes.    Disposition: 20-Expired   SignedWandra Mannan 11/30/2014, 8:23 AM

## 2014-11-19 DEATH — deceased

## 2014-11-21 ENCOUNTER — Ambulatory Visit: Payer: Self-pay | Admitting: Cardiology

## 2014-11-21 ENCOUNTER — Encounter (HOSPITAL_COMMUNITY): Payer: Medicare Other

## 2014-11-21 DIAGNOSIS — I4891 Unspecified atrial fibrillation: Secondary | ICD-10-CM

## 2014-11-23 LAB — CULTURE, BLOOD (ROUTINE X 2): Culture: NO GROWTH

## 2014-11-24 ENCOUNTER — Encounter: Payer: Self-pay | Admitting: Internal Medicine

## 2014-11-24 ENCOUNTER — Encounter (HOSPITAL_COMMUNITY): Payer: Medicare Other

## 2014-11-25 ENCOUNTER — Encounter: Payer: Self-pay | Admitting: Internal Medicine

## 2014-11-28 ENCOUNTER — Encounter: Payer: Self-pay | Admitting: *Deleted

## 2015-01-27 NOTE — Telephone Encounter (Signed)
This encounter was created in error - please disregard.

## 2016-05-06 IMAGING — CT CT ABD-PELV W/O CM
2 of 4 series · 16 of 46 positions shown, 18 images · non-contrast
Comparison: None.

CLINICAL DATA: Abdominal pain with nausea.

EXAM:
CT ABDOMEN AND PELVIS WITHOUT CONTRAST
TECHNIQUE: Multidetector CT imaging of the abdomen and pelvis was performed
following the standard protocol without IV contrast.

[Series 2: abd/ pelvis 5.0 i30f 1 · axial · 0.85mm/px · z∈[+1018,+1443]mm · 13 of 95 slices shown, 15 images]
[im 5/95  soft-tissue]
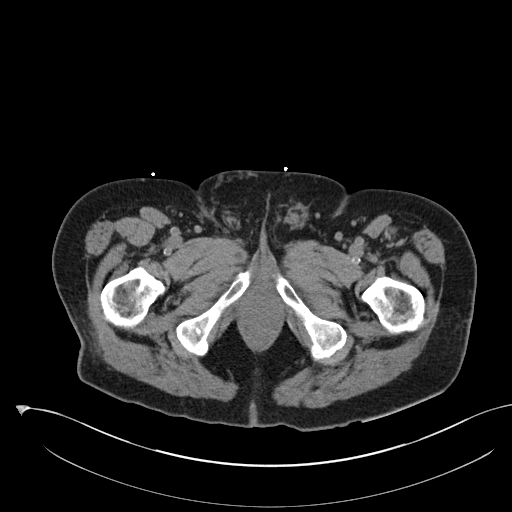
[im 5/95  bone]
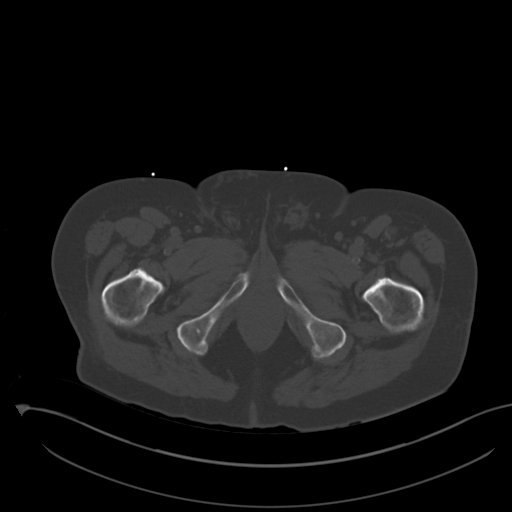
[im 13/95  soft-tissue]
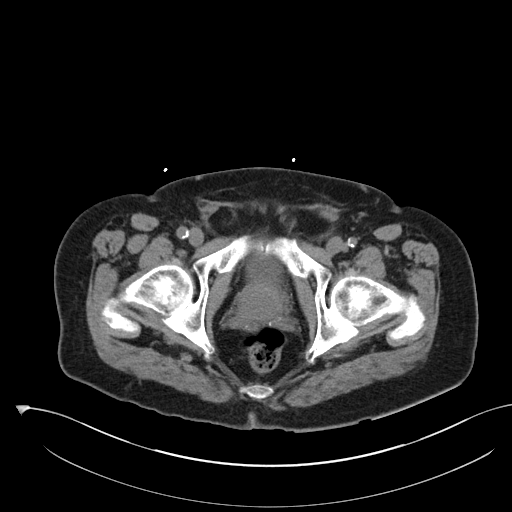
[im 22/95  soft-tissue]
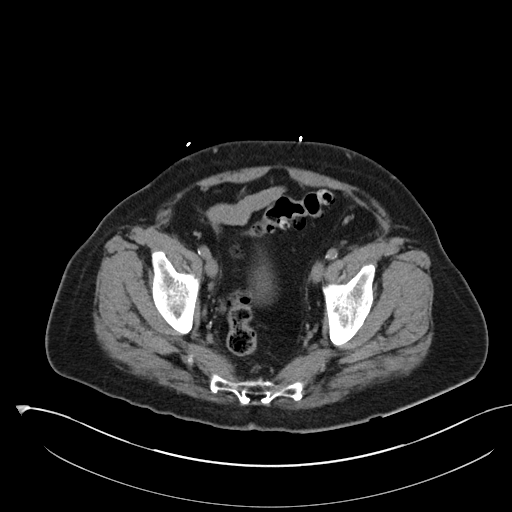
[im 26/95  soft-tissue]
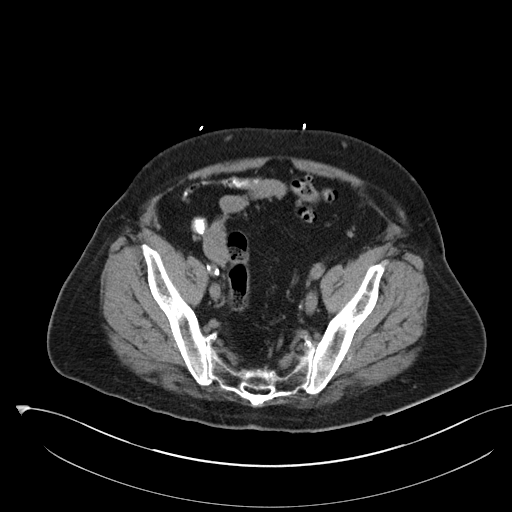
[im 35/95  soft-tissue]
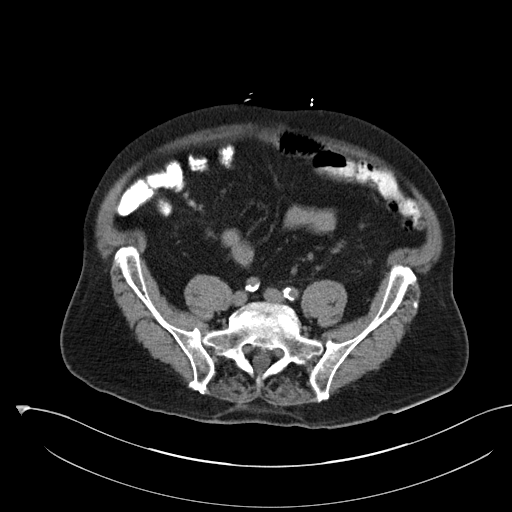
[im 39/95  soft-tissue]
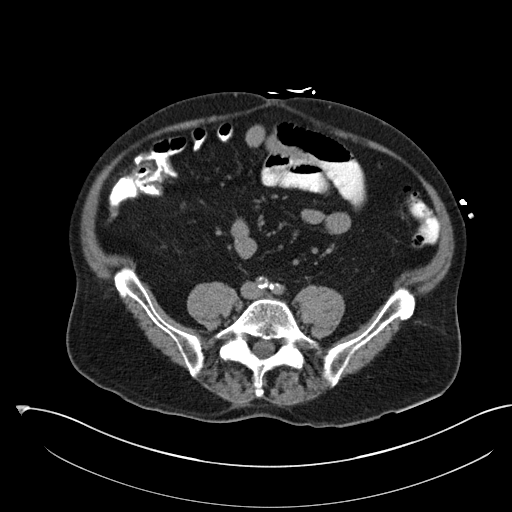
[im 48/95  soft-tissue]
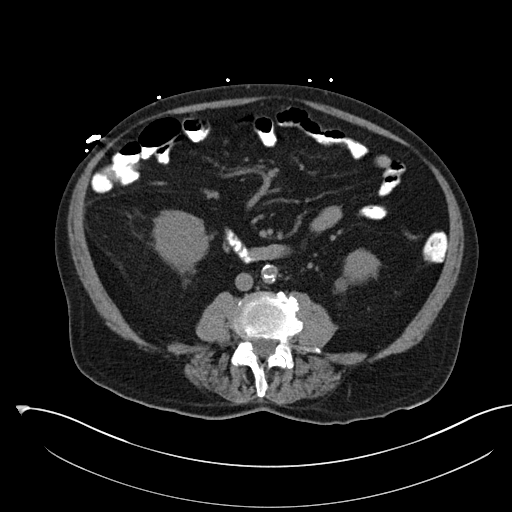
[im 56/95  soft-tissue]
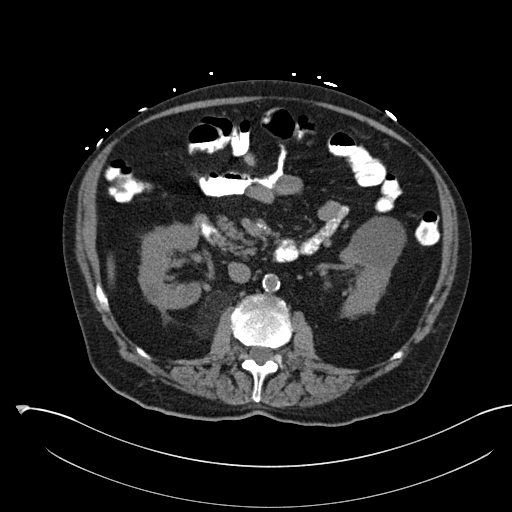
[im 60/95  soft-tissue]
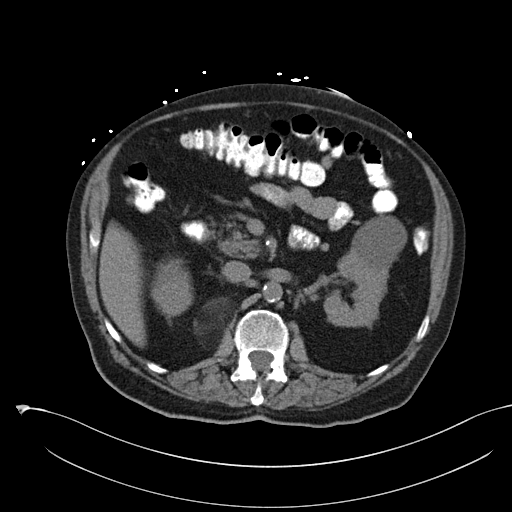
[im 60/95  bone]
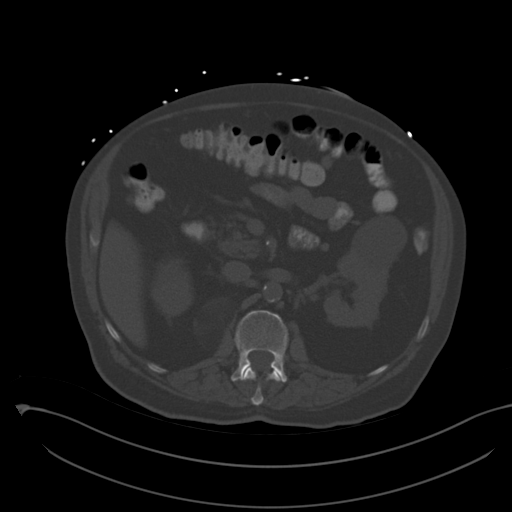
[im 69/95  soft-tissue]
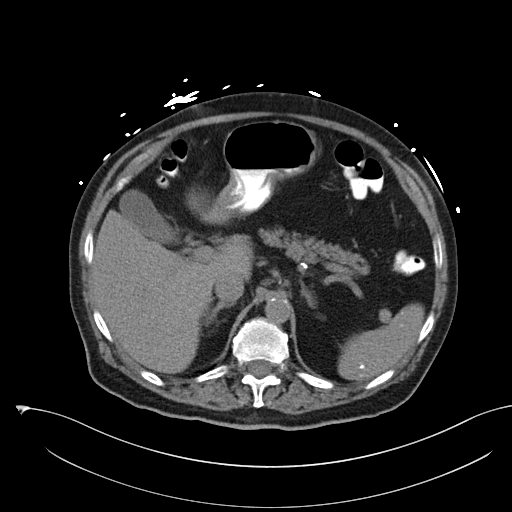
[im 73/95  soft-tissue]
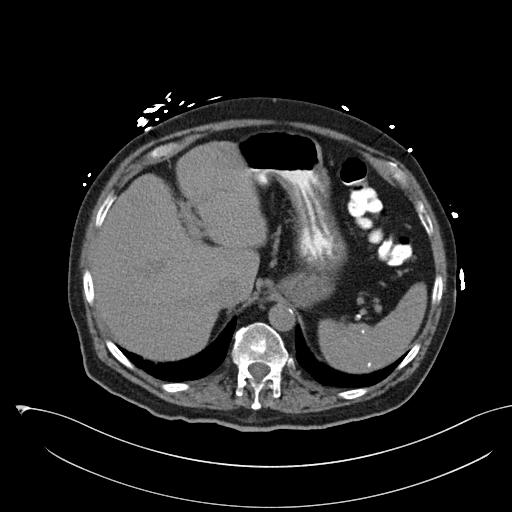
[im 82/95  soft-tissue]
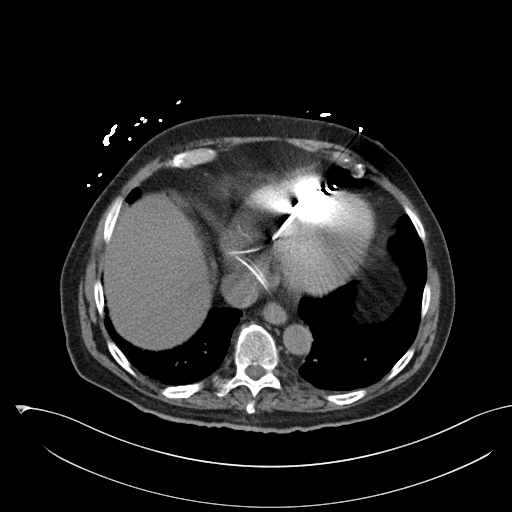
[im 90/95  soft-tissue]
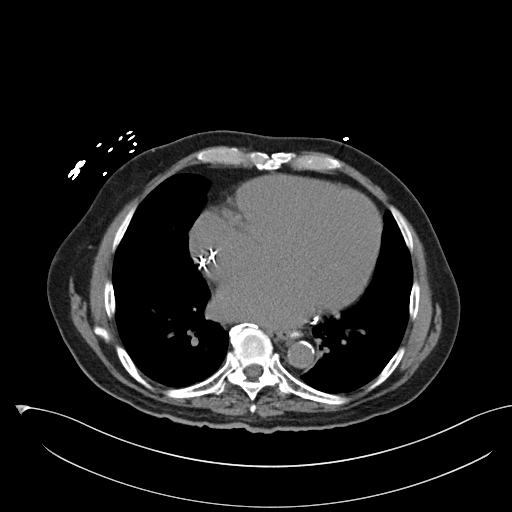

[Series 5: coronals · coronal · 0.83mm/px · 3 of 143 slices shown]
[im 48/143  soft-tissue]
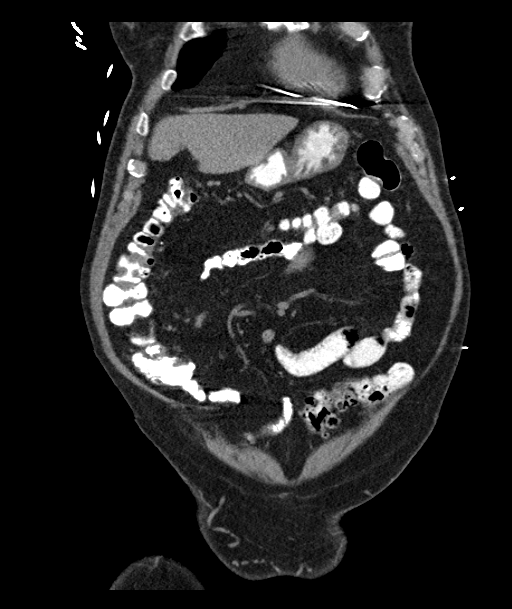
[im 64/143  soft-tissue]
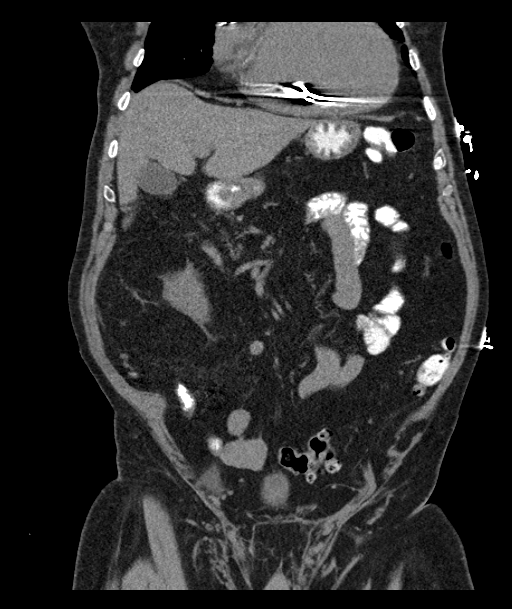
[im 79/143  soft-tissue]
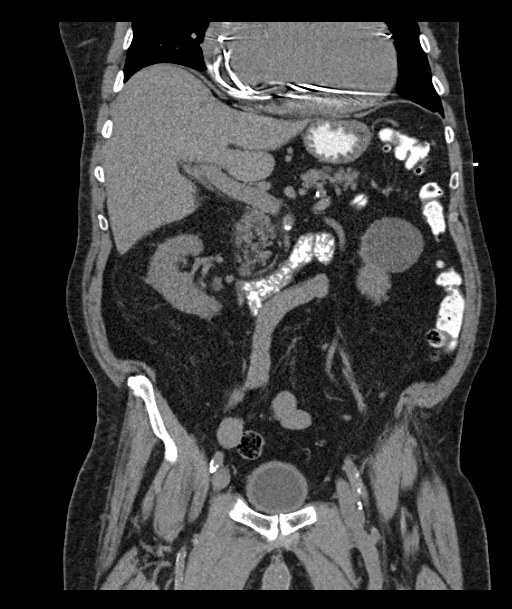

[16 of 46 positions shown; findings below may reference images not displayed]

FINDINGS: There is cardiomegaly.  Is a tiny hiatal hernia.

There is suggestion of very tiny subtle stones in the gallbladder.
There is no dilatation of the biliary tree in the gallbladder wall
is not thickened in the gallbladder is not distended. Liver is
normal. There are calcified granulomas in the otherwise normal
spleen. Pancreas and adrenal glands are normal. There are multiple
diverticula scattered throughout the colon. The bowel is otherwise
normal.

There are multiple low-density lesions in the right kidney, probably
cysts, including an 11 mm lesion in the mid right kidney, a 3.8 mm
lesion in the lower pole medially and 15 mm lesion laterally in the
lower pole. There is a 5 cm cyst in the anterior aspect of the mid
left kidney and exophytic 10 mm lesion on the left lower pole.

There is extensive calcification in the abdominal aorta and common
iliac arteries. No acute osseous abnormality.

There is a 6.0 x 4.5 x 2.7 cm primarily fatty mass in the
retroperitoneum between the upper pole of the right kidney and the
upper lumbar spine and proximal soleus muscle. I suspect this
represents a liposarcoma. This was not present on the prior CT scan
of 03/28/2004.
IMPRESSION: 6 cm probable liposarcoma of the right retroperitoneum.

Possible tiny gallstones.

Probable bilateral renal cysts. This could be confirmed with
ultrasound.

Diverticulosis.  No acute diverticulitis.

## 2016-05-06 IMAGING — CR DG CHEST 2V
2 series · 2 of 2 positions shown · non-contrast
Comparison: 10/27/2012

CLINICAL DATA: Chest pain, abdominal pain

EXAM:
CHEST  2 VIEW

[w chest pa]
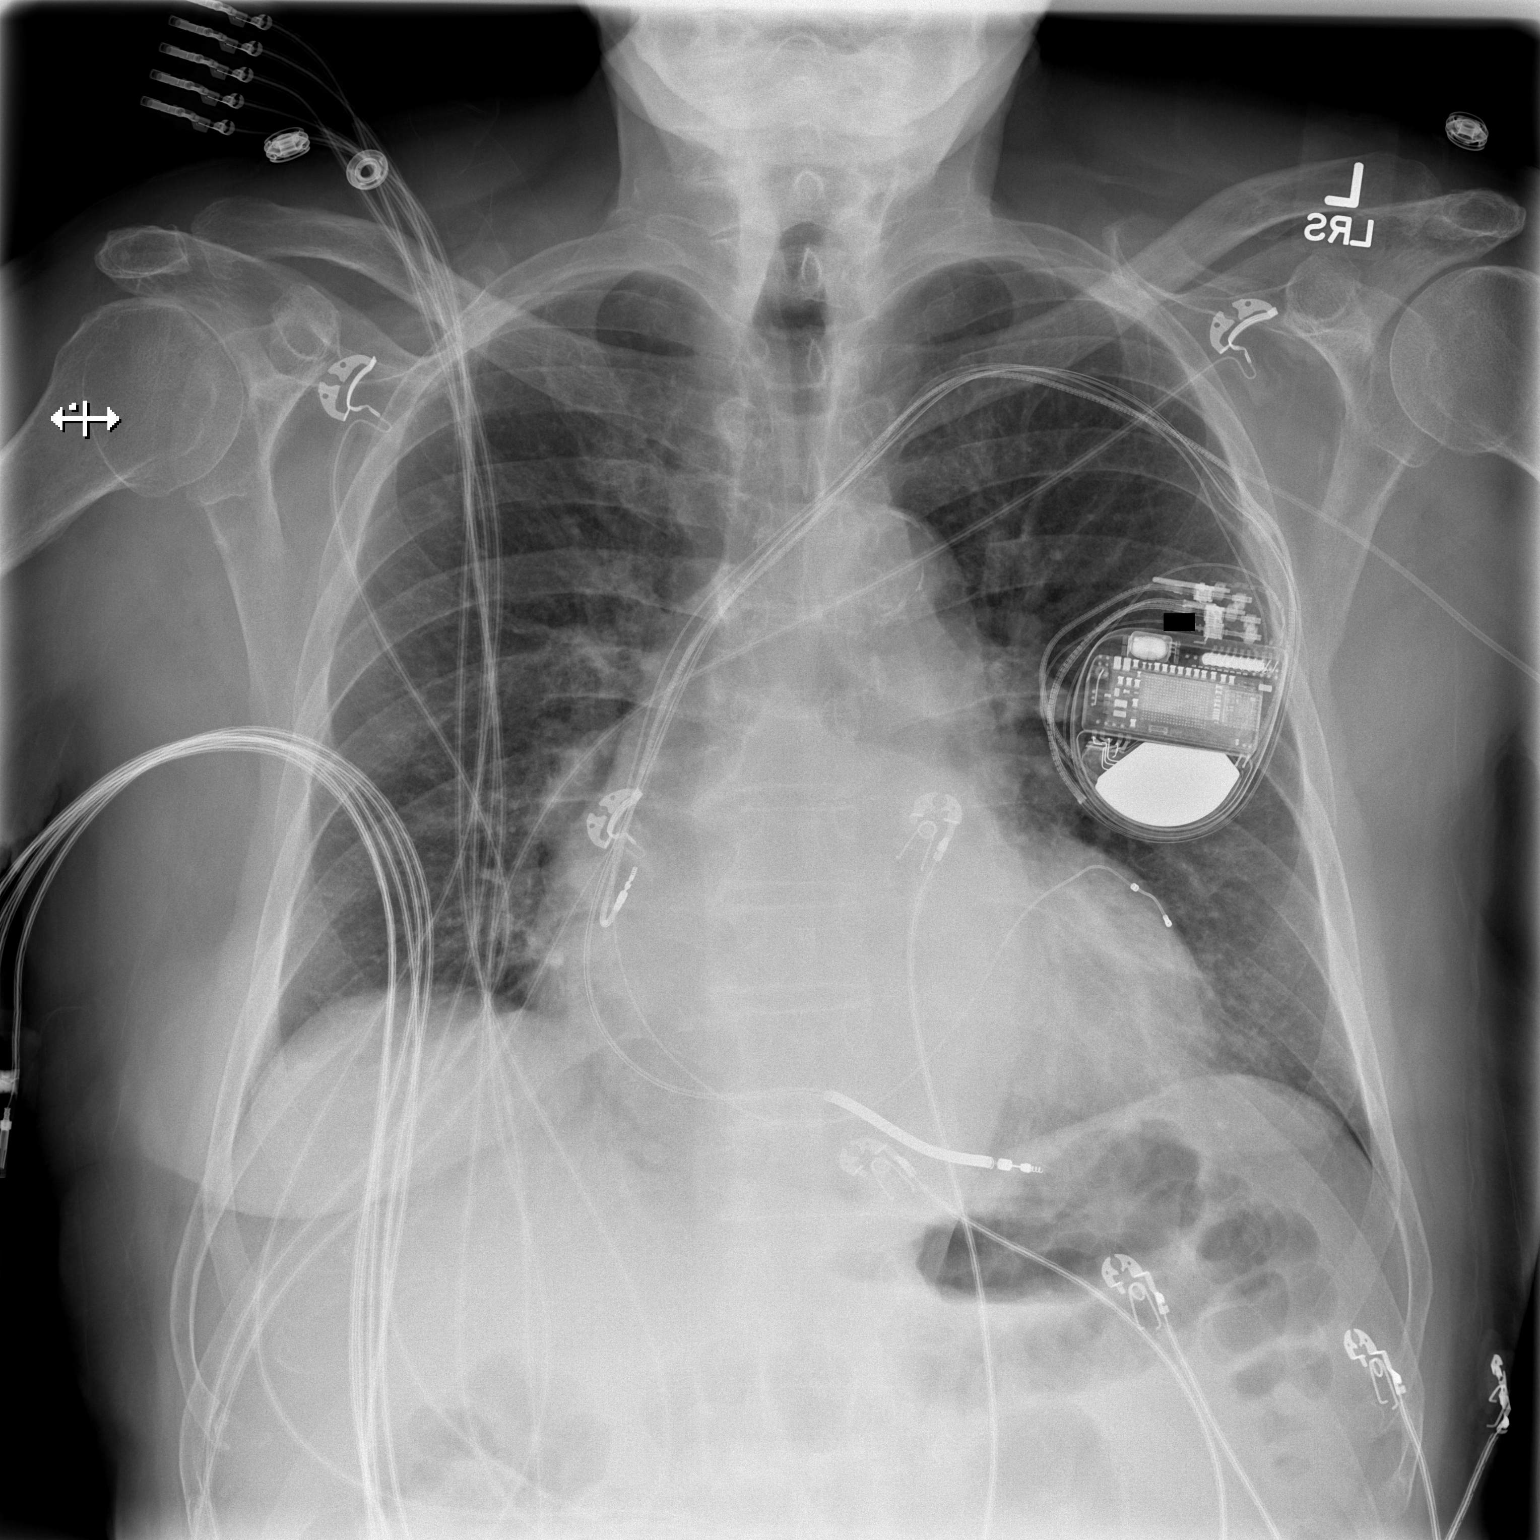

[w chest lat]
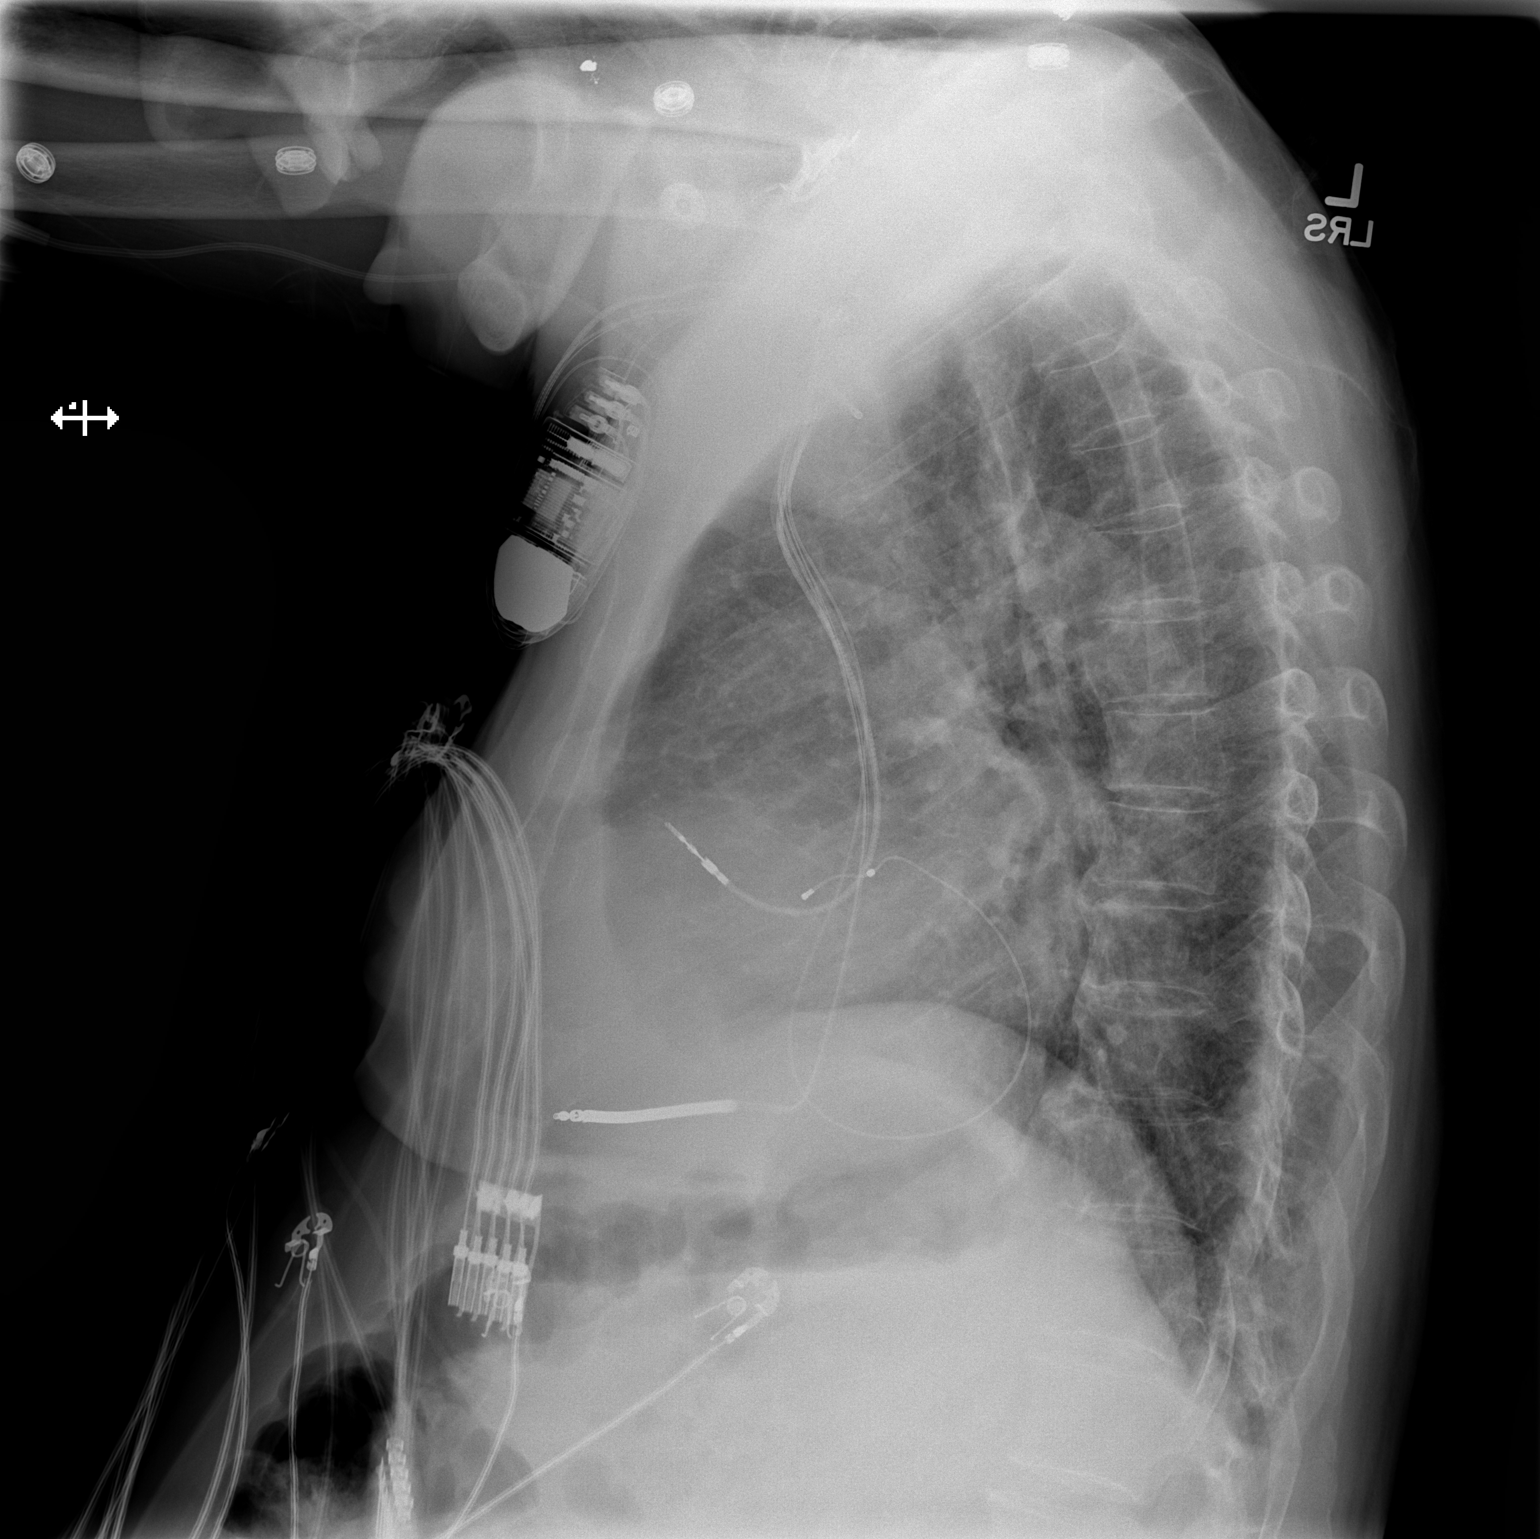

[2 of 2 positions shown; findings below may reference images not displayed]

FINDINGS: Cardiomegaly again noted. Three leads cardiac pacemaker is unchanged
in position. Left arm PICC line stable in position. No acute
infiltrate or pulmonary edema. Mild degenerative changes thoracic
spine again noted.
IMPRESSION: Cardiomegaly again noted. No acute infiltrate or pulmonary edema.
Stable left arm PICC line position.

## 2016-12-31 IMAGING — CR DG CHEST 1V PORT
1 series · 1 of 1 positions shown · non-contrast
Comparison: 06/05/2014

CLINICAL DATA: Port-A-Cath in place.  Vomiting for 4 days.

EXAM:
PORTABLE CHEST - 1 VIEW

[AP]
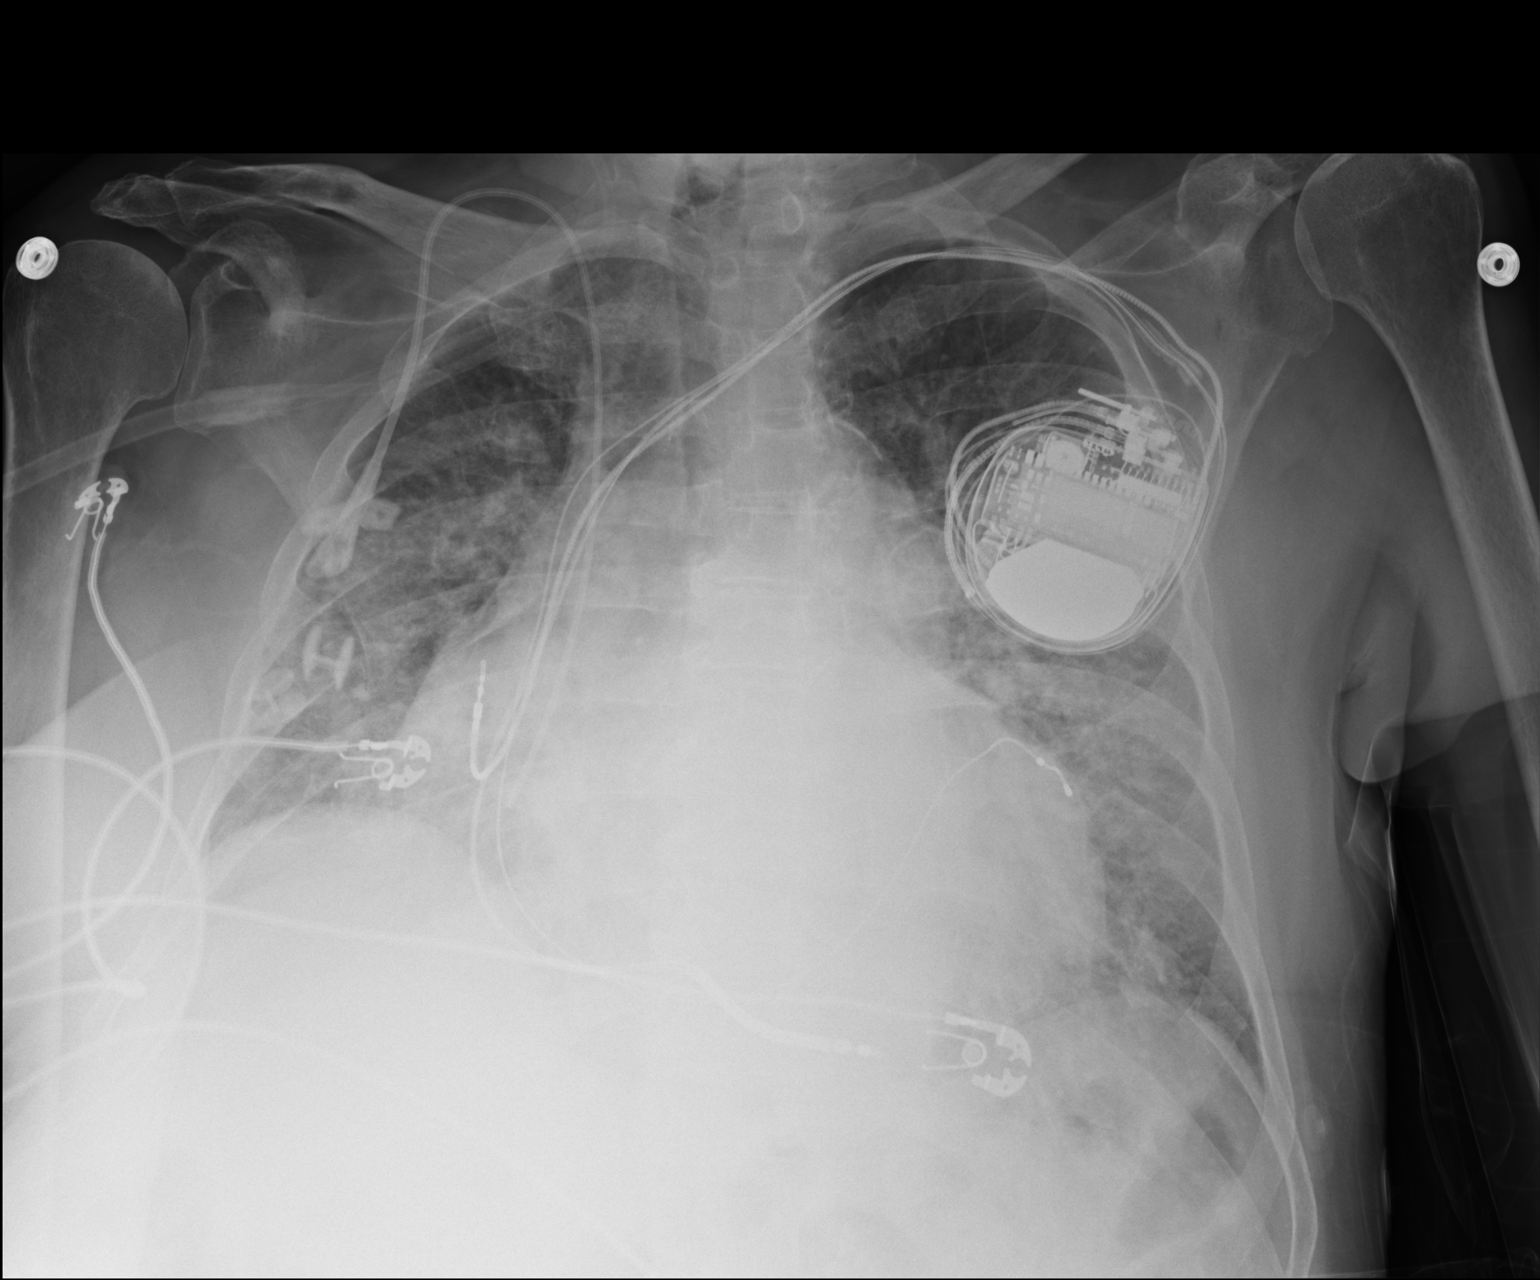

[1 of 1 positions shown; findings below may reference images not displayed]

FINDINGS: Tip of the right central line in the atrial caval junction. Multi
lead left-sided pacemaker remains in place. Lower lung volumes from
prior. Equivocal increase in cardiomegaly versus differences in
technique. There is worsening vascular congestion and pulmonary
edema. Questionable blunting of right costophrenic angle effusion.
No pneumothorax.
IMPRESSION: 1. Tip of the right central line at the atrial caval junction.
2. Findings consistent with worsening CHF.

## 2017-02-09 IMAGING — DX DG CHEST 2V
2 series · 2 of 2 positions shown · non-contrast
Comparison: Chest radiograph 07/04/2014

CLINICAL DATA: Chest pressure short of breath since last night.

EXAM:
CHEST  2 VIEW

[chest pa]
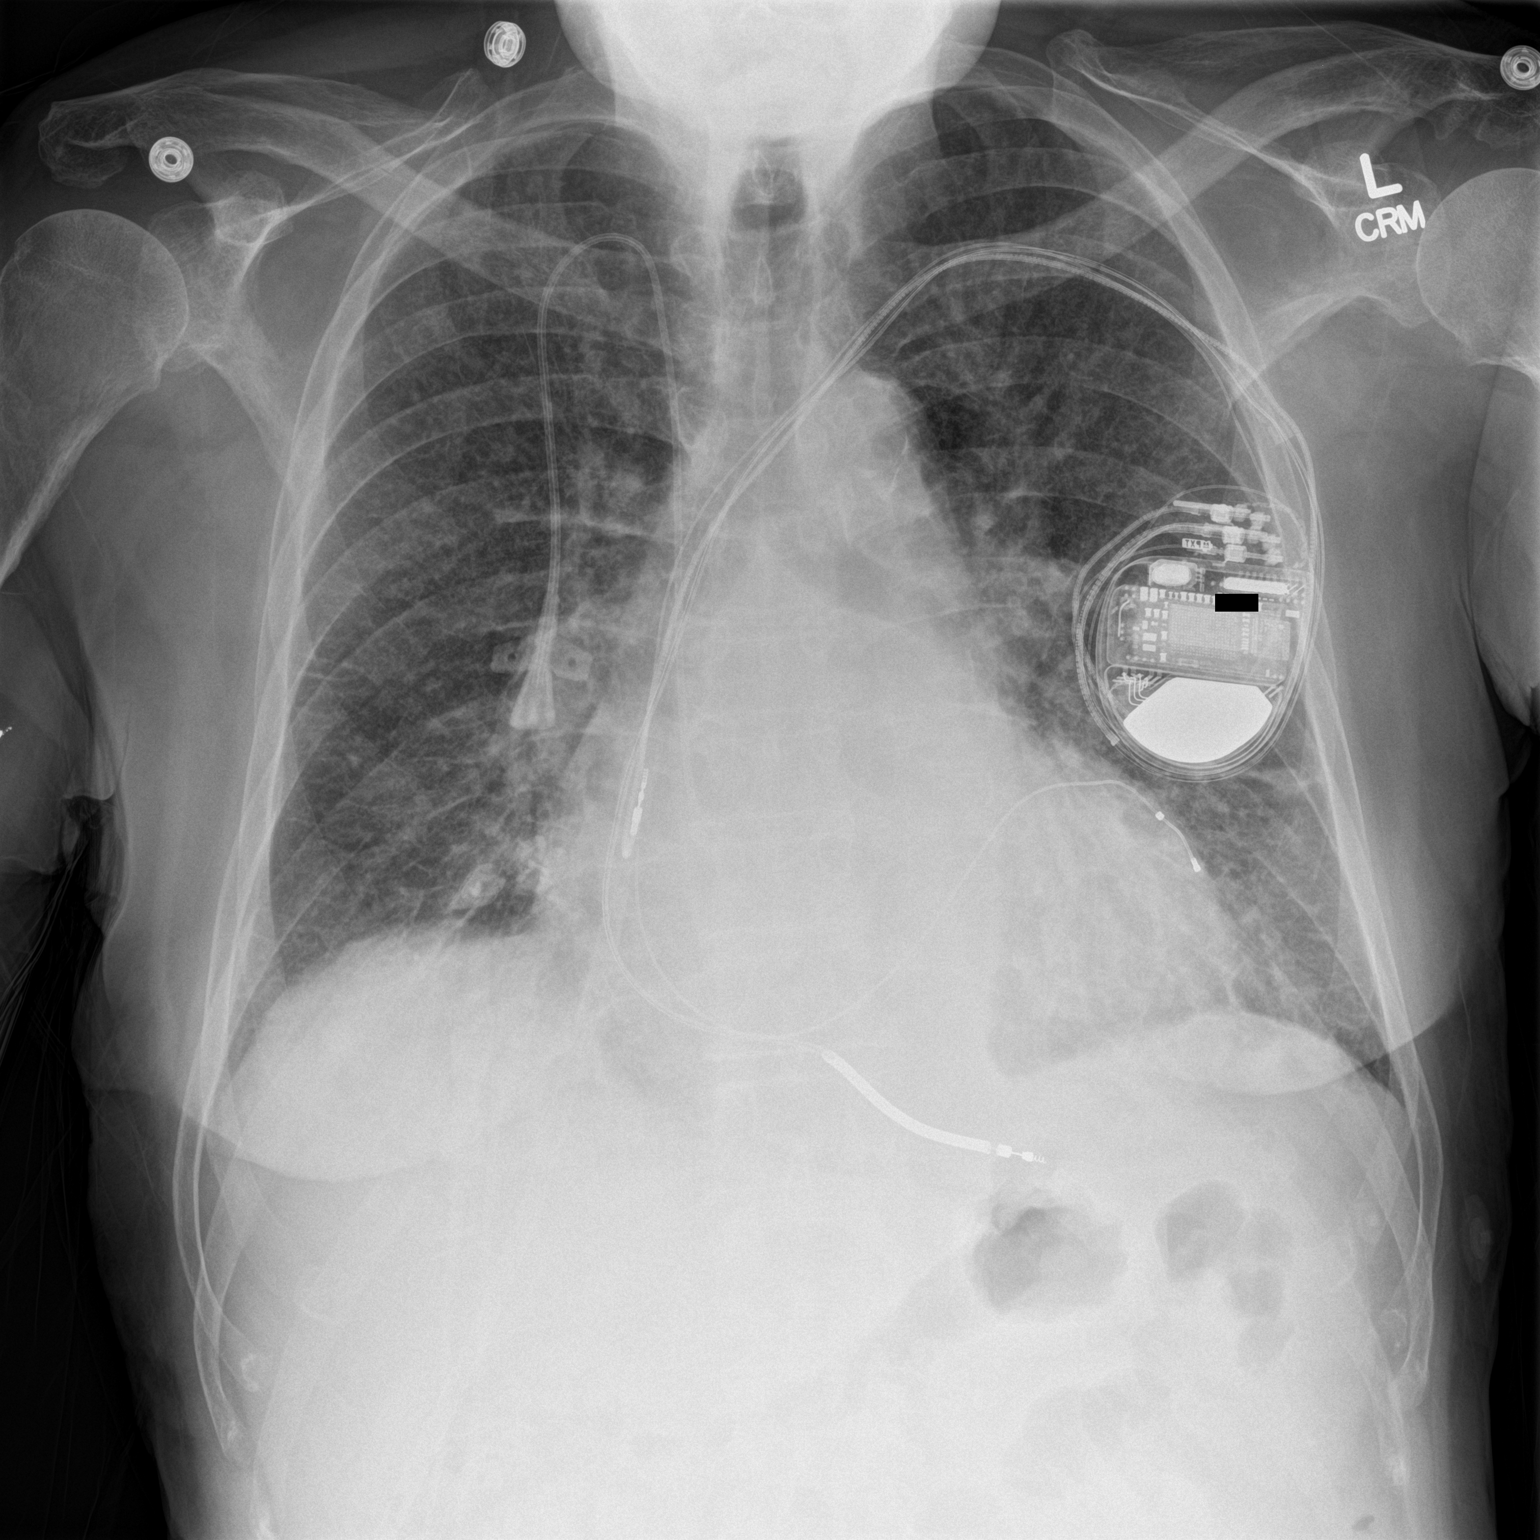

[chest lat]
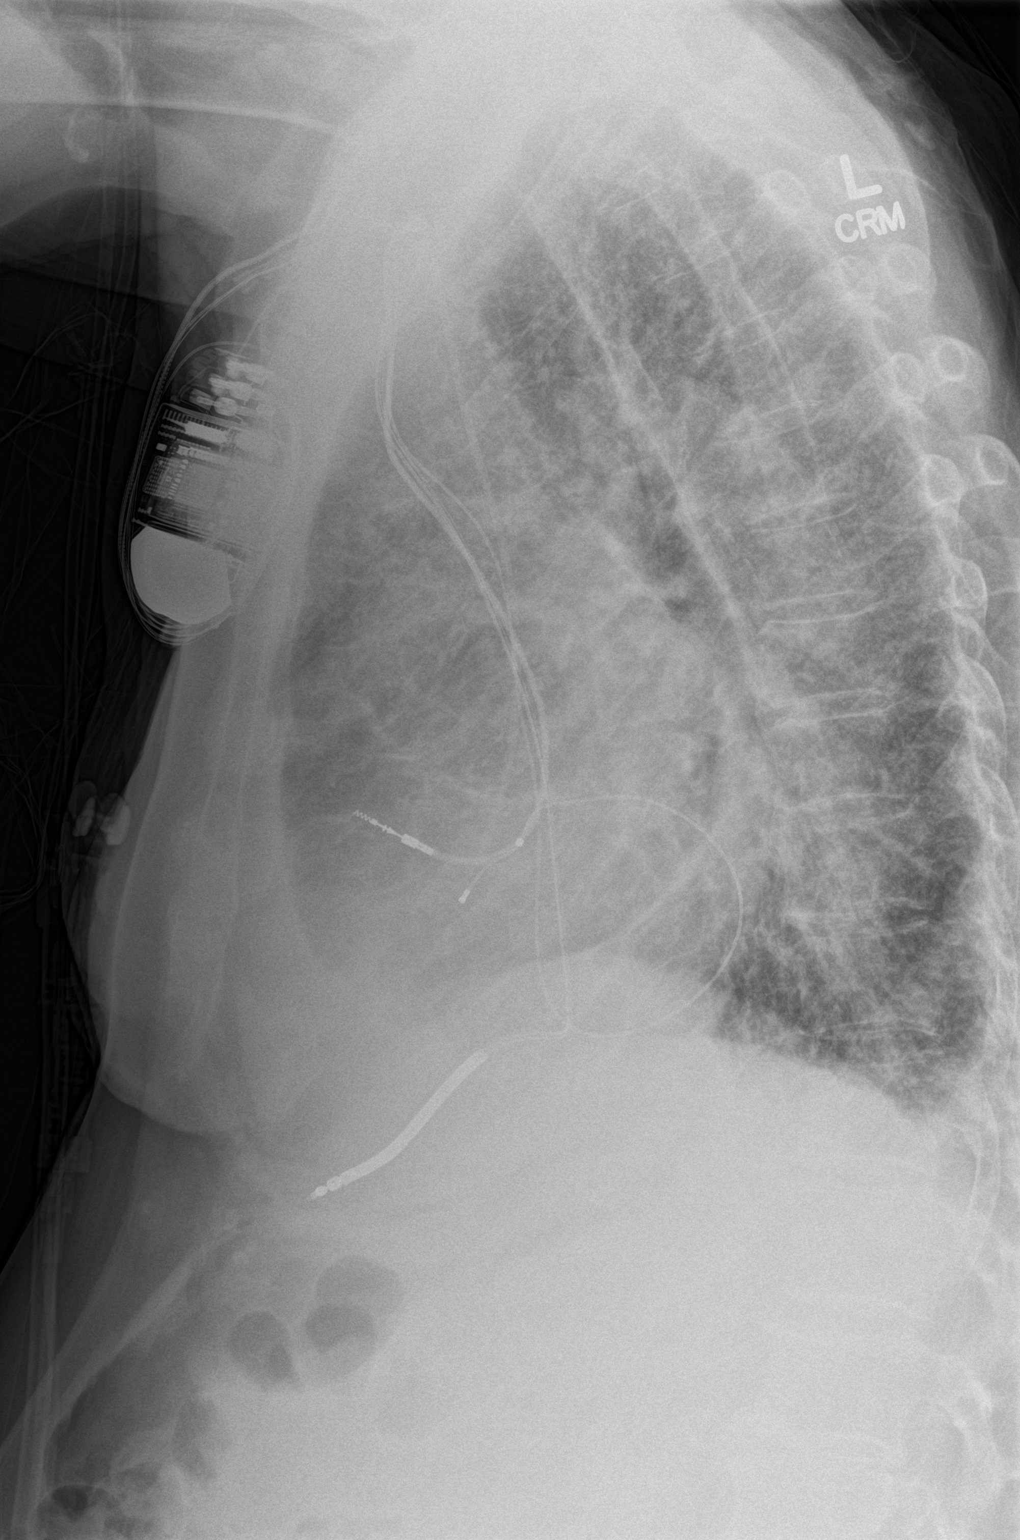

[2 of 2 positions shown; findings below may reference images not displayed]

FINDINGS: Left-sided pacemaker overlies normal cardiac silhouette. There is
fine interstitial pattern which is improved from comparison exam.
Small bilateral pleural effusions present. No pneumothorax. Right
central venous line is noted.
IMPRESSION: 1. Improvement in interstitial edema pattern.
2. Small effusions.
